# Patient Record
Sex: Female | Born: 1940 | Race: White | Hispanic: No | Smoking: Former smoker
Health system: Southern US, Community
[De-identification: ages and names within clinical notes are randomized; demographics above are authoritative.]

## PROBLEM LIST (undated history)

## (undated) DIAGNOSIS — N135 Crossing vessel and stricture of ureter without hydronephrosis: Secondary | ICD-10-CM

## (undated) DIAGNOSIS — D126 Benign neoplasm of colon, unspecified: Secondary | ICD-10-CM

## (undated) DIAGNOSIS — E785 Hyperlipidemia, unspecified: Secondary | ICD-10-CM

## (undated) DIAGNOSIS — R112 Nausea with vomiting, unspecified: Secondary | ICD-10-CM

## (undated) DIAGNOSIS — I4891 Unspecified atrial fibrillation: Secondary | ICD-10-CM

## (undated) DIAGNOSIS — I2510 Atherosclerotic heart disease of native coronary artery without angina pectoris: Secondary | ICD-10-CM

## (undated) DIAGNOSIS — Z9889 Other specified postprocedural states: Secondary | ICD-10-CM

## (undated) DIAGNOSIS — I739 Peripheral vascular disease, unspecified: Secondary | ICD-10-CM

## (undated) DIAGNOSIS — R0609 Other forms of dyspnea: Secondary | ICD-10-CM

## (undated) DIAGNOSIS — D509 Iron deficiency anemia, unspecified: Secondary | ICD-10-CM

## (undated) DIAGNOSIS — Z8619 Personal history of other infectious and parasitic diseases: Secondary | ICD-10-CM

## (undated) DIAGNOSIS — E119 Type 2 diabetes mellitus without complications: Secondary | ICD-10-CM

## (undated) DIAGNOSIS — R0600 Dyspnea, unspecified: Secondary | ICD-10-CM

## (undated) DIAGNOSIS — K922 Gastrointestinal hemorrhage, unspecified: Secondary | ICD-10-CM

## (undated) DIAGNOSIS — K219 Gastro-esophageal reflux disease without esophagitis: Secondary | ICD-10-CM

## (undated) DIAGNOSIS — I255 Ischemic cardiomyopathy: Secondary | ICD-10-CM

## (undated) DIAGNOSIS — I509 Heart failure, unspecified: Secondary | ICD-10-CM

## (undated) DIAGNOSIS — E039 Hypothyroidism, unspecified: Secondary | ICD-10-CM

## (undated) DIAGNOSIS — N1330 Unspecified hydronephrosis: Secondary | ICD-10-CM

## (undated) DIAGNOSIS — K5790 Diverticulosis of intestine, part unspecified, without perforation or abscess without bleeding: Secondary | ICD-10-CM

## (undated) DIAGNOSIS — I6529 Occlusion and stenosis of unspecified carotid artery: Secondary | ICD-10-CM

## (undated) DIAGNOSIS — I714 Abdominal aortic aneurysm, without rupture, unspecified: Secondary | ICD-10-CM

## (undated) DIAGNOSIS — K682 Retroperitoneal fibrosis: Secondary | ICD-10-CM

## (undated) DIAGNOSIS — I2583 Coronary atherosclerosis due to lipid rich plaque: Secondary | ICD-10-CM

## (undated) DIAGNOSIS — K8050 Calculus of bile duct without cholangitis or cholecystitis without obstruction: Secondary | ICD-10-CM

## (undated) DIAGNOSIS — N183 Chronic kidney disease, stage 3 unspecified: Secondary | ICD-10-CM

## (undated) DIAGNOSIS — I10 Essential (primary) hypertension: Secondary | ICD-10-CM

## (undated) HISTORY — PX: KNEE SURGERY: SHX244

## (undated) HISTORY — DX: Gastrointestinal hemorrhage, unspecified: K92.2

## (undated) HISTORY — PX: ESOPHAGOGASTRODUODENOSCOPY: SHX1529

## (undated) HISTORY — DX: Benign neoplasm of colon, unspecified: D12.6

## (undated) HISTORY — PX: CAROTID ENDARTERECTOMY: SUR193

## (undated) HISTORY — DX: Peripheral vascular disease, unspecified: I73.9

## (undated) HISTORY — DX: Diverticulosis of intestine, part unspecified, without perforation or abscess without bleeding: K57.90

## (undated) HISTORY — PX: COLONOSCOPY: SHX174

## (undated) HISTORY — PX: CARDIOVASCULAR STRESS TEST: SHX262

## (undated) HISTORY — DX: Ischemic cardiomyopathy: I25.5

## (undated) HISTORY — PX: CARDIAC CATHETERIZATION: SHX172

---

## 1984-03-08 HISTORY — PX: TOTAL ABDOMINAL HYSTERECTOMY W/ BILATERAL SALPINGOOPHORECTOMY: SHX83

## 1995-03-09 HISTORY — PX: AORTA - BILATERAL FEMORAL ARTERY BYPASS GRAFT: SHX1175

## 1995-03-09 HISTORY — PX: CORONARY ARTERY BYPASS GRAFT: SHX141

## 1999-03-05 ENCOUNTER — Encounter: Payer: Self-pay | Admitting: Emergency Medicine

## 1999-03-05 ENCOUNTER — Encounter: Admission: RE | Admit: 1999-03-05 | Discharge: 1999-03-05 | Payer: Self-pay | Admitting: Emergency Medicine

## 2000-03-07 ENCOUNTER — Encounter: Payer: Self-pay | Admitting: Endocrinology

## 2000-03-07 ENCOUNTER — Encounter: Admission: RE | Admit: 2000-03-07 | Discharge: 2000-03-07 | Payer: Self-pay | Admitting: Endocrinology

## 2001-10-05 ENCOUNTER — Encounter: Admission: RE | Admit: 2001-10-05 | Discharge: 2001-10-05 | Payer: Self-pay | Admitting: Endocrinology

## 2001-10-05 ENCOUNTER — Encounter: Payer: Self-pay | Admitting: Endocrinology

## 2004-03-08 HISTORY — PX: CATARACT EXTRACTION W/ INTRAOCULAR LENS  IMPLANT, BILATERAL: SHX1307

## 2004-11-16 ENCOUNTER — Encounter: Admission: RE | Admit: 2004-11-16 | Discharge: 2004-11-16 | Payer: Self-pay | Admitting: Endocrinology

## 2004-11-17 ENCOUNTER — Ambulatory Visit (HOSPITAL_COMMUNITY): Admission: RE | Admit: 2004-11-17 | Discharge: 2004-11-17 | Payer: Self-pay | Admitting: Endocrinology

## 2004-11-19 ENCOUNTER — Inpatient Hospital Stay (HOSPITAL_COMMUNITY): Admission: EM | Admit: 2004-11-19 | Discharge: 2004-11-23 | Payer: Self-pay | Admitting: Emergency Medicine

## 2004-11-22 ENCOUNTER — Encounter (INDEPENDENT_AMBULATORY_CARE_PROVIDER_SITE_OTHER): Payer: Self-pay | Admitting: *Deleted

## 2004-11-27 ENCOUNTER — Ambulatory Visit (HOSPITAL_COMMUNITY): Admission: RE | Admit: 2004-11-27 | Discharge: 2004-11-27 | Payer: Self-pay | Admitting: Cardiovascular Disease

## 2004-12-01 ENCOUNTER — Encounter (HOSPITAL_BASED_OUTPATIENT_CLINIC_OR_DEPARTMENT_OTHER): Admission: RE | Admit: 2004-12-01 | Discharge: 2005-03-01 | Payer: Self-pay | Admitting: Surgery

## 2004-12-02 ENCOUNTER — Ambulatory Visit (HOSPITAL_COMMUNITY): Admission: RE | Admit: 2004-12-02 | Discharge: 2004-12-02 | Payer: Self-pay | Admitting: Endocrinology

## 2004-12-23 ENCOUNTER — Ambulatory Visit (HOSPITAL_COMMUNITY): Admission: RE | Admit: 2004-12-23 | Discharge: 2004-12-23 | Payer: Self-pay | Admitting: Vascular Surgery

## 2005-01-12 ENCOUNTER — Inpatient Hospital Stay (HOSPITAL_COMMUNITY): Admission: RE | Admit: 2005-01-12 | Discharge: 2005-01-18 | Payer: Self-pay | Admitting: *Deleted

## 2005-01-12 HISTORY — PX: OTHER SURGICAL HISTORY: SHX169

## 2005-11-24 ENCOUNTER — Encounter: Admission: RE | Admit: 2005-11-24 | Discharge: 2005-11-24 | Payer: Self-pay | Admitting: Endocrinology

## 2006-01-17 ENCOUNTER — Encounter: Admission: RE | Admit: 2006-01-17 | Discharge: 2006-01-17 | Payer: Self-pay | Admitting: Endocrinology

## 2006-02-15 ENCOUNTER — Ambulatory Visit: Admission: RE | Admit: 2006-02-15 | Discharge: 2006-02-15 | Payer: Self-pay | Admitting: *Deleted

## 2006-03-23 ENCOUNTER — Ambulatory Visit: Payer: Self-pay | Admitting: Cardiology

## 2006-03-23 ENCOUNTER — Ambulatory Visit (HOSPITAL_COMMUNITY): Admission: RE | Admit: 2006-03-23 | Discharge: 2006-03-23 | Payer: Self-pay | Admitting: *Deleted

## 2006-09-08 ENCOUNTER — Ambulatory Visit: Payer: Self-pay | Admitting: *Deleted

## 2007-01-19 ENCOUNTER — Ambulatory Visit: Payer: Self-pay | Admitting: *Deleted

## 2007-05-04 ENCOUNTER — Ambulatory Visit: Payer: Self-pay | Admitting: *Deleted

## 2007-11-02 ENCOUNTER — Ambulatory Visit: Payer: Self-pay | Admitting: *Deleted

## 2007-11-28 ENCOUNTER — Encounter: Admission: RE | Admit: 2007-11-28 | Discharge: 2007-11-28 | Payer: Self-pay | Admitting: Endocrinology

## 2007-12-05 ENCOUNTER — Encounter: Admission: RE | Admit: 2007-12-05 | Discharge: 2007-12-05 | Payer: Self-pay | Admitting: Endocrinology

## 2009-01-01 ENCOUNTER — Encounter: Admission: RE | Admit: 2009-01-01 | Discharge: 2009-01-01 | Payer: Self-pay | Admitting: Endocrinology

## 2009-11-14 ENCOUNTER — Inpatient Hospital Stay (HOSPITAL_COMMUNITY): Admission: EM | Admit: 2009-11-14 | Discharge: 2009-11-22 | Payer: Self-pay | Admitting: Emergency Medicine

## 2009-11-14 ENCOUNTER — Ambulatory Visit: Payer: Self-pay | Admitting: Internal Medicine

## 2009-11-14 ENCOUNTER — Ambulatory Visit: Payer: Self-pay | Admitting: Cardiology

## 2009-11-18 ENCOUNTER — Ambulatory Visit: Payer: Self-pay | Admitting: Surgery

## 2009-11-18 ENCOUNTER — Encounter (INDEPENDENT_AMBULATORY_CARE_PROVIDER_SITE_OTHER): Payer: Self-pay | Admitting: Internal Medicine

## 2009-11-19 ENCOUNTER — Encounter (INDEPENDENT_AMBULATORY_CARE_PROVIDER_SITE_OTHER): Payer: Self-pay | Admitting: Internal Medicine

## 2009-11-25 ENCOUNTER — Inpatient Hospital Stay (HOSPITAL_COMMUNITY): Admission: EM | Admit: 2009-11-25 | Discharge: 2009-12-02 | Payer: Self-pay | Admitting: Emergency Medicine

## 2009-11-26 ENCOUNTER — Encounter (INDEPENDENT_AMBULATORY_CARE_PROVIDER_SITE_OTHER): Payer: Self-pay | Admitting: *Deleted

## 2009-12-04 ENCOUNTER — Inpatient Hospital Stay (HOSPITAL_COMMUNITY): Admission: EM | Admit: 2009-12-04 | Discharge: 2009-12-15 | Payer: Self-pay | Admitting: Emergency Medicine

## 2009-12-05 ENCOUNTER — Encounter (INDEPENDENT_AMBULATORY_CARE_PROVIDER_SITE_OTHER): Payer: Self-pay | Admitting: Internal Medicine

## 2009-12-10 ENCOUNTER — Encounter: Payer: Self-pay | Admitting: Vascular Surgery

## 2009-12-11 ENCOUNTER — Ambulatory Visit: Payer: Self-pay | Admitting: Thoracic Surgery (Cardiothoracic Vascular Surgery)

## 2009-12-25 ENCOUNTER — Ambulatory Visit: Payer: Self-pay | Admitting: Vascular Surgery

## 2010-01-12 ENCOUNTER — Inpatient Hospital Stay (HOSPITAL_COMMUNITY): Admission: EM | Admit: 2010-01-12 | Discharge: 2010-01-14 | Payer: Self-pay | Admitting: Emergency Medicine

## 2010-03-25 HISTORY — PX: TRANSTHORACIC ECHOCARDIOGRAM: SHX275

## 2010-03-28 ENCOUNTER — Encounter: Payer: Self-pay | Admitting: Cardiology

## 2010-04-09 NOTE — Letter (Signed)
Summary: Appointment - Cardiac MRI  [facility]  [address] [place]   [place], [place] [address]   Phone: [phone]  Fax: [phone]      November 26, 2009 MRN: [id number]   [place] [address], [place]  [address]   Dear Ms. Villegas,   We have scheduled the above patient for an appointment for a Cardiac MRI on Oct 4,2011 at 2:00 p.m.  Please refer to the below information for the location and instructions for this test:  Location:     [facility]       [place],   [address] Instructions:    Eartha Inch at [facility] 45 minutes prior to your appointment time.  This will ensure you are in the Radiology Department 30 minutes prior to your appointment.    There are no restrictions for this test you may eat and take medications as usual.  If you need to reschedule this appointment please call at the number listed above.  Sincerely,        [facility] Scheduling Team

## 2010-05-19 LAB — CBC
HCT: 32.3 % — ABNORMAL LOW (ref 36.0–46.0)
Hemoglobin: 11.1 g/dL — ABNORMAL LOW (ref 12.0–15.0)
Hemoglobin: 11.1 g/dL — ABNORMAL LOW (ref 12.0–15.0)
MCH: 32.2 pg (ref 26.0–34.0)
MCHC: 34 g/dL (ref 30.0–36.0)
MCHC: 34.2 g/dL (ref 30.0–36.0)
MCV: 93.9 fL (ref 78.0–100.0)
MCV: 94.6 fL (ref 78.0–100.0)
Platelets: 196 10*3/uL (ref 150–400)
RDW: 15.5 % (ref 11.5–15.5)
WBC: 5.8 10*3/uL (ref 4.0–10.5)
WBC: 6.4 10*3/uL (ref 4.0–10.5)

## 2010-05-19 LAB — GLUCOSE, CAPILLARY
Glucose-Capillary: 111 mg/dL — ABNORMAL HIGH (ref 70–99)
Glucose-Capillary: 116 mg/dL — ABNORMAL HIGH (ref 70–99)
Glucose-Capillary: 122 mg/dL — ABNORMAL HIGH (ref 70–99)
Glucose-Capillary: 135 mg/dL — ABNORMAL HIGH (ref 70–99)
Glucose-Capillary: 174 mg/dL — ABNORMAL HIGH (ref 70–99)
Glucose-Capillary: 26 mg/dL — CL (ref 70–99)
Glucose-Capillary: 55 mg/dL — ABNORMAL LOW (ref 70–99)
Glucose-Capillary: 76 mg/dL (ref 70–99)
Glucose-Capillary: 99 mg/dL (ref 70–99)

## 2010-05-19 LAB — DIFFERENTIAL
Basophils Absolute: 0 10*3/uL (ref 0.0–0.1)
Basophils Relative: 0 % (ref 0–1)
Eosinophils Relative: 0 % (ref 0–5)
Lymphocytes Relative: 10 % — ABNORMAL LOW (ref 12–46)
Lymphocytes Relative: 8 % — ABNORMAL LOW (ref 12–46)
Monocytes Absolute: 0.6 10*3/uL (ref 0.1–1.0)
Monocytes Relative: 10 % (ref 3–12)
Neutro Abs: 4.7 10*3/uL (ref 1.7–7.7)
Neutro Abs: 5.2 10*3/uL (ref 1.7–7.7)

## 2010-05-19 LAB — CARDIAC PANEL(CRET KIN+CKTOT+MB+TROPI)
CK, MB: 0.8 ng/mL (ref 0.3–4.0)
CK, MB: 1.7 ng/mL (ref 0.3–4.0)
Relative Index: INVALID (ref 0.0–2.5)
Relative Index: INVALID (ref 0.0–2.5)
Total CK: 36 U/L (ref 7–177)
Total CK: 42 U/L (ref 7–177)
Troponin I: 0.14 ng/mL — ABNORMAL HIGH (ref 0.00–0.06)
Troponin I: 0.36 ng/mL — ABNORMAL HIGH (ref 0.00–0.06)

## 2010-05-19 LAB — POCT CARDIAC MARKERS: Myoglobin, poc: 72.7 ng/mL (ref 12–200)

## 2010-05-19 LAB — COMPREHENSIVE METABOLIC PANEL
AST: 35 U/L (ref 0–37)
Albumin: 2.1 g/dL — ABNORMAL LOW (ref 3.5–5.2)
Alkaline Phosphatase: 38 U/L — ABNORMAL LOW (ref 39–117)
Chloride: 107 mEq/L (ref 96–112)
GFR calc Af Amer: >60 mL/min (ref >60)
Potassium: 3.4 mEq/L — ABNORMAL LOW (ref 3.5–5.1)
Total Bilirubin: 0.2 mg/dL — ABNORMAL LOW (ref 0.3–1.2)
Total Protein: 5 g/dL — ABNORMAL LOW (ref 6.0–8.3)

## 2010-05-19 LAB — URINE MICROSCOPIC-ADD ON

## 2010-05-19 LAB — BASIC METABOLIC PANEL
BUN: 14 mg/dL (ref 6–23)
CO2: 26 mEq/L (ref 19–32)
Calcium: 7.5 mg/dL — ABNORMAL LOW (ref 8.4–10.5)
Creatinine, Ser: 0.89 mg/dL (ref 0.4–1.2)
Creatinine, Ser: 1.06 mg/dL (ref 0.4–1.2)
GFR calc non Af Amer: >60 mL/min (ref >60)
Glucose, Bld: 133 mg/dL — ABNORMAL HIGH (ref 70–99)

## 2010-05-19 LAB — URINALYSIS, ROUTINE W REFLEX MICROSCOPIC
Glucose, UA: NEGATIVE mg/dL
Hgb urine dipstick: NEGATIVE
Protein, ur: 30 mg/dL — AB
pH: 6.5 (ref 5.0–8.0)

## 2010-05-19 LAB — TSH: TSH: 0.105 u[IU]/mL — ABNORMAL LOW (ref 0.350–4.500)

## 2010-05-19 LAB — HEMOGLOBIN A1C: Hgb A1c MFr Bld: 5.3 % (ref <5.7)

## 2010-05-19 LAB — LIPID PANEL
LDL Cholesterol: 39 mg/dL (ref 0–99)
VLDL: 31 mg/dL (ref 0–40)

## 2010-05-19 LAB — URINE CULTURE: Colony Count: >=100000

## 2010-05-20 LAB — TYPE AND SCREEN

## 2010-05-20 LAB — GLUCOSE, CAPILLARY
Glucose-Capillary: 107 mg/dL — ABNORMAL HIGH (ref 70–99)
Glucose-Capillary: 108 mg/dL — ABNORMAL HIGH (ref 70–99)
Glucose-Capillary: 111 mg/dL — ABNORMAL HIGH (ref 70–99)
Glucose-Capillary: 115 mg/dL — ABNORMAL HIGH (ref 70–99)
Glucose-Capillary: 115 mg/dL — ABNORMAL HIGH (ref 70–99)
Glucose-Capillary: 115 mg/dL — ABNORMAL HIGH (ref 70–99)
Glucose-Capillary: 118 mg/dL — ABNORMAL HIGH (ref 70–99)
Glucose-Capillary: 124 mg/dL — ABNORMAL HIGH (ref 70–99)
Glucose-Capillary: 128 mg/dL — ABNORMAL HIGH (ref 70–99)
Glucose-Capillary: 138 mg/dL — ABNORMAL HIGH (ref 70–99)
Glucose-Capillary: 142 mg/dL — ABNORMAL HIGH (ref 70–99)
Glucose-Capillary: 142 mg/dL — ABNORMAL HIGH (ref 70–99)
Glucose-Capillary: 144 mg/dL — ABNORMAL HIGH (ref 70–99)
Glucose-Capillary: 146 mg/dL — ABNORMAL HIGH (ref 70–99)
Glucose-Capillary: 147 mg/dL — ABNORMAL HIGH (ref 70–99)
Glucose-Capillary: 167 mg/dL — ABNORMAL HIGH (ref 70–99)
Glucose-Capillary: 175 mg/dL — ABNORMAL HIGH (ref 70–99)
Glucose-Capillary: 88 mg/dL (ref 70–99)
Glucose-Capillary: 92 mg/dL (ref 70–99)
Glucose-Capillary: 93 mg/dL (ref 70–99)
Glucose-Capillary: 98 mg/dL (ref 70–99)

## 2010-05-20 LAB — COMPREHENSIVE METABOLIC PANEL
ALT: 10 U/L (ref 0–35)
ALT: 10 U/L (ref 0–35)
ALT: 8 U/L (ref 0–35)
ALT: 9 U/L (ref 0–35)
ALT: 9 U/L (ref 0–35)
ALT: 9 U/L (ref 0–35)
AST: 15 U/L (ref 0–37)
AST: 15 U/L (ref 0–37)
AST: 18 U/L (ref 0–37)
AST: 26 U/L (ref 0–37)
Albumin: 1.9 g/dL — ABNORMAL LOW (ref 3.5–5.2)
Alkaline Phosphatase: 29 U/L — ABNORMAL LOW (ref 39–117)
Alkaline Phosphatase: 41 U/L (ref 39–117)
Alkaline Phosphatase: 46 U/L (ref 39–117)
CO2: 19 mEq/L (ref 19–32)
CO2: 24 mEq/L (ref 19–32)
CO2: 27 mEq/L (ref 19–32)
Calcium: 7.7 mg/dL — ABNORMAL LOW (ref 8.4–10.5)
Calcium: 8.1 mg/dL — ABNORMAL LOW (ref 8.4–10.5)
Calcium: 8.2 mg/dL — ABNORMAL LOW (ref 8.4–10.5)
Calcium: 8.6 mg/dL (ref 8.4–10.5)
Calcium: 9.2 mg/dL (ref 8.4–10.5)
Chloride: 108 mEq/L (ref 96–112)
Chloride: 110 mEq/L (ref 96–112)
Chloride: 114 mEq/L — ABNORMAL HIGH (ref 96–112)
Creatinine, Ser: 0.73 mg/dL (ref 0.4–1.2)
Creatinine, Ser: 0.87 mg/dL (ref 0.4–1.2)
GFR calc Af Amer: >60 mL/min (ref >60)
GFR calc Af Amer: >60 mL/min (ref >60)
GFR calc Af Amer: >60 mL/min (ref >60)
GFR calc non Af Amer: >60 mL/min (ref >60)
GFR calc non Af Amer: >60 mL/min (ref >60)
GFR calc non Af Amer: >60 mL/min (ref >60)
Glucose, Bld: 117 mg/dL — ABNORMAL HIGH (ref 70–99)
Glucose, Bld: 137 mg/dL — ABNORMAL HIGH (ref 70–99)
Glucose, Bld: 94 mg/dL (ref 70–99)
Potassium: 3.1 mEq/L — ABNORMAL LOW (ref 3.5–5.1)
Potassium: 4.2 mEq/L (ref 3.5–5.1)
Potassium: 4.3 mEq/L (ref 3.5–5.1)
Sodium: 139 mEq/L (ref 135–145)
Sodium: 139 mEq/L (ref 135–145)
Sodium: 139 mEq/L (ref 135–145)
Sodium: 140 mEq/L (ref 135–145)
Sodium: 140 mEq/L (ref 135–145)
Total Bilirubin: 0.4 mg/dL (ref 0.3–1.2)
Total Bilirubin: 0.5 mg/dL (ref 0.3–1.2)
Total Protein: 4.3 g/dL — ABNORMAL LOW (ref 6.0–8.3)
Total Protein: 4.8 g/dL — ABNORMAL LOW (ref 6.0–8.3)
Total Protein: 4.8 g/dL — ABNORMAL LOW (ref 6.0–8.3)

## 2010-05-20 LAB — CBC
HCT: 28.7 % — ABNORMAL LOW (ref 36.0–46.0)
HCT: 31.7 % — ABNORMAL LOW (ref 36.0–46.0)
HCT: 33.4 % — ABNORMAL LOW (ref 36.0–46.0)
HCT: 33.5 % — ABNORMAL LOW (ref 36.0–46.0)
HCT: 34.1 % — ABNORMAL LOW (ref 36.0–46.0)
HCT: 35.1 % — ABNORMAL LOW (ref 36.0–46.0)
Hemoglobin: 10 g/dL — ABNORMAL LOW (ref 12.0–15.0)
Hemoglobin: 10.1 g/dL — ABNORMAL LOW (ref 12.0–15.0)
Hemoglobin: 10.5 g/dL — ABNORMAL LOW (ref 12.0–15.0)
Hemoglobin: 11.1 g/dL — ABNORMAL LOW (ref 12.0–15.0)
Hemoglobin: 11.8 g/dL — ABNORMAL LOW (ref 12.0–15.0)
Hemoglobin: 9.2 g/dL — ABNORMAL LOW (ref 12.0–15.0)
MCH: 31 pg (ref 26.0–34.0)
MCH: 31.3 pg (ref 26.0–34.0)
MCH: 31.6 pg (ref 26.0–34.0)
MCHC: 31.4 g/dL (ref 30.0–36.0)
MCHC: 31.9 g/dL (ref 30.0–36.0)
MCHC: 32.2 g/dL (ref 30.0–36.0)
MCHC: 32.2 g/dL (ref 30.0–36.0)
MCHC: 32.5 g/dL (ref 30.0–36.0)
MCHC: 32.6 g/dL (ref 30.0–36.0)
MCV: 96.6 fL (ref 78.0–100.0)
MCV: 97.6 fL (ref 78.0–100.0)
Platelets: 187 10*3/uL (ref 150–400)
Platelets: 211 10*3/uL (ref 150–400)
Platelets: 251 10*3/uL (ref 150–400)
Platelets: 317 10*3/uL (ref 150–400)
RBC: 2.64 MIL/uL — ABNORMAL LOW (ref 3.87–5.11)
RBC: 2.94 MIL/uL — ABNORMAL LOW (ref 3.87–5.11)
RBC: 3.18 MIL/uL — ABNORMAL LOW (ref 3.87–5.11)
RBC: 3.32 MIL/uL — ABNORMAL LOW (ref 3.87–5.11)
RBC: 3.76 MIL/uL — ABNORMAL LOW (ref 3.87–5.11)
RDW: 18.2 % — ABNORMAL HIGH (ref 11.5–15.5)
RDW: 18.3 % — ABNORMAL HIGH (ref 11.5–15.5)
RDW: 18.9 % — ABNORMAL HIGH (ref 11.5–15.5)
RDW: 19.1 % — ABNORMAL HIGH (ref 11.5–15.5)
RDW: 19.3 % — ABNORMAL HIGH (ref 11.5–15.5)
WBC: 5.3 10*3/uL (ref 4.0–10.5)
WBC: 6.6 10*3/uL (ref 4.0–10.5)
WBC: 7.1 10*3/uL (ref 4.0–10.5)
WBC: 8.6 10*3/uL (ref 4.0–10.5)

## 2010-05-20 LAB — BASIC METABOLIC PANEL
BUN: 7 mg/dL (ref 6–23)
CO2: 21 mEq/L (ref 19–32)
Calcium: 8 mg/dL — ABNORMAL LOW (ref 8.4–10.5)
Chloride: 109 mEq/L (ref 96–112)
Creatinine, Ser: 0.78 mg/dL (ref 0.4–1.2)
GFR calc Af Amer: >60 mL/min (ref >60)
GFR calc Af Amer: >60 mL/min (ref >60)
GFR calc non Af Amer: >60 mL/min (ref >60)
GFR calc non Af Amer: >60 mL/min (ref >60)
Glucose, Bld: 138 mg/dL — ABNORMAL HIGH (ref 70–99)
Glucose, Bld: 143 mg/dL — ABNORMAL HIGH (ref 70–99)
Potassium: 3.6 mEq/L (ref 3.5–5.1)
Potassium: 4.9 mEq/L (ref 3.5–5.1)
Sodium: 138 mEq/L (ref 135–145)
Sodium: 138 mEq/L (ref 135–145)
Sodium: 140 mEq/L (ref 135–145)

## 2010-05-20 LAB — CLOSTRIDIUM DIFFICILE EIA: C difficile Toxins A+B, EIA: NEGATIVE

## 2010-05-20 LAB — HEMOGLOBIN AND HEMATOCRIT, BLOOD
HCT: 32.8 % — ABNORMAL LOW (ref 36.0–46.0)
Hemoglobin: 10.5 g/dL — ABNORMAL LOW (ref 12.0–15.0)

## 2010-05-20 LAB — PREPARE RBC (CROSSMATCH)

## 2010-05-20 LAB — CLOSTRIDIUM DIFFICILE BY PCR

## 2010-05-21 LAB — CBC
HCT: 24 % — ABNORMAL LOW (ref 36.0–46.0)
HCT: 25.9 % — ABNORMAL LOW (ref 36.0–46.0)
HCT: 30.1 % — ABNORMAL LOW (ref 36.0–46.0)
HCT: 31 % — ABNORMAL LOW (ref 36.0–46.0)
HCT: 31.1 % — ABNORMAL LOW (ref 36.0–46.0)
HCT: 33.1 % — ABNORMAL LOW (ref 36.0–46.0)
HCT: 25.3 % — ABNORMAL LOW (ref 36.0–46.0)
HCT: 26.5 % — ABNORMAL LOW (ref 36.0–46.0)
HCT: 27 % — ABNORMAL LOW (ref 36.0–46.0)
HCT: 31.6 % — ABNORMAL LOW (ref 36.0–46.0)
Hemoglobin: 10 g/dL — ABNORMAL LOW (ref 12.0–15.0)
Hemoglobin: 14.1 g/dL (ref 12.0–15.0)
Hemoglobin: 8.3 g/dL — ABNORMAL LOW (ref 12.0–15.0)
Hemoglobin: 9.7 g/dL — ABNORMAL LOW (ref 12.0–15.0)
Hemoglobin: 10.2 g/dL — ABNORMAL LOW (ref 12.0–15.0)
Hemoglobin: 8 g/dL — ABNORMAL LOW (ref 12.0–15.0)
Hemoglobin: 8.6 g/dL — ABNORMAL LOW (ref 12.0–15.0)
MCH: 30.1 pg (ref 26.0–34.0)
MCH: 30.8 pg (ref 26.0–34.0)
MCH: 30.9 pg (ref 26.0–34.0)
MCH: 31.4 pg (ref 26.0–34.0)
MCH: 32.3 pg (ref 26.0–34.0)
MCH: 28.4 pg (ref 26.0–34.0)
MCH: 28.6 pg (ref 26.0–34.0)
MCH: 28.7 pg (ref 26.0–34.0)
MCH: 29 pg (ref 26.0–34.0)
MCH: 29.1 pg (ref 26.0–34.0)
MCHC: 31.3 g/dL (ref 30.0–36.0)
MCHC: 32.1 g/dL (ref 30.0–36.0)
MCHC: 32.2 g/dL (ref 30.0–36.0)
MCHC: 32.6 g/dL (ref 30.0–36.0)
MCHC: 31.3 g/dL (ref 30.0–36.0)
MCHC: 31.6 g/dL (ref 30.0–36.0)
MCHC: 32.3 g/dL (ref 30.0–36.0)
MCHC: 32.5 g/dL (ref 30.0–36.0)
MCHC: 32.6 g/dL (ref 30.0–36.0)
MCV: 93.7 fL (ref 78.0–100.0)
MCV: 93.8 fL (ref 78.0–100.0)
MCV: 94.7 fL (ref 78.0–100.0)
MCV: 95.2 fL (ref 78.0–100.0)
MCV: 96.2 fL (ref 78.0–100.0)
MCV: 97.9 fL (ref 78.0–100.0)
MCV: 98.8 fL (ref 78.0–100.0)
MCV: 89 fL (ref 78.0–100.0)
MCV: 89.2 fL (ref 78.0–100.0)
MCV: 89.3 fL (ref 78.0–100.0)
MCV: 90.4 fL (ref 78.0–100.0)
MCV: 90.6 fL (ref 78.0–100.0)
MCV: 92.2 fL (ref 78.0–100.0)
Platelets: 279 10*3/uL (ref 150–400)
Platelets: 286 10*3/uL (ref 150–400)
Platelets: 300 10*3/uL (ref 150–400)
Platelets: 335 10*3/uL (ref 150–400)
Platelets: 309 10*3/uL (ref 150–400)
Platelets: 325 10*3/uL (ref 150–400)
Platelets: 335 10*3/uL (ref 150–400)
Platelets: 344 10*3/uL (ref 150–400)
Platelets: 397 10*3/uL (ref 150–400)
Platelets: 458 10*3/uL — ABNORMAL HIGH (ref 150–400)
RBC: 3.18 MIL/uL — ABNORMAL LOW (ref 3.87–5.11)
RBC: 3.53 MIL/uL — ABNORMAL LOW (ref 3.87–5.11)
RBC: 4.37 MIL/uL (ref 3.87–5.11)
RBC: 2.8 MIL/uL — ABNORMAL LOW (ref 3.87–5.11)
RBC: 2.93 MIL/uL — ABNORMAL LOW (ref 3.87–5.11)
RBC: 2.97 MIL/uL — ABNORMAL LOW (ref 3.87–5.11)
RBC: 3.1 MIL/uL — ABNORMAL LOW (ref 3.87–5.11)
RBC: 3.55 MIL/uL — ABNORMAL LOW (ref 3.87–5.11)
RDW: 20.2 % — ABNORMAL HIGH (ref 11.5–15.5)
RDW: 20.4 % — ABNORMAL HIGH (ref 11.5–15.5)
RDW: 22.4 % — ABNORMAL HIGH (ref 11.5–15.5)
RDW: 22.6 % — ABNORMAL HIGH (ref 11.5–15.5)
RDW: 22.7 % — ABNORMAL HIGH (ref 11.5–15.5)
RDW: 21.2 % — ABNORMAL HIGH (ref 11.5–15.5)
RDW: 21.7 % — ABNORMAL HIGH (ref 11.5–15.5)
RDW: 21.8 % — ABNORMAL HIGH (ref 11.5–15.5)
RDW: 22 % — ABNORMAL HIGH (ref 11.5–15.5)
RDW: 22.3 % — ABNORMAL HIGH (ref 11.5–15.5)
RDW: 22.5 % — ABNORMAL HIGH (ref 11.5–15.5)
WBC: 10.1 10*3/uL (ref 4.0–10.5)
WBC: 10.4 10*3/uL (ref 4.0–10.5)
WBC: 18.3 10*3/uL — ABNORMAL HIGH (ref 4.0–10.5)
WBC: 24.3 10*3/uL — ABNORMAL HIGH (ref 4.0–10.5)
WBC: 9.7 10*3/uL (ref 4.0–10.5)
WBC: 17.3 10*3/uL — ABNORMAL HIGH (ref 4.0–10.5)
WBC: 6.1 10*3/uL (ref 4.0–10.5)
WBC: 6.9 10*3/uL (ref 4.0–10.5)
WBC: 8.1 10*3/uL (ref 4.0–10.5)

## 2010-05-21 LAB — CULTURE, BLOOD (ROUTINE X 2)
Culture  Setup Time: 201109210210
Culture  Setup Time: 201109300151
Culture: NO GROWTH
Culture: NO GROWTH
Culture: NO GROWTH
Culture: NO GROWTH

## 2010-05-21 LAB — CK TOTAL AND CKMB (NOT AT ARMC)
CK, MB: 3.3 ng/mL (ref 0.3–4.0)
CK, MB: 3.4 ng/mL (ref 0.3–4.0)
CK, MB: 5.3 ng/mL — ABNORMAL HIGH (ref 0.3–4.0)
CK, MB: 4.3 ng/mL — ABNORMAL HIGH (ref 0.3–4.0)
Relative Index: INVALID (ref 0.0–2.5)
Relative Index: INVALID (ref 0.0–2.5)
Relative Index: INVALID (ref 0.0–2.5)
Total CK: 21 U/L (ref 7–177)
Total CK: 31 U/L (ref 7–177)
Total CK: 50 U/L (ref 7–177)
Total CK: 65 U/L (ref 7–177)

## 2010-05-21 LAB — DIFFERENTIAL
Basophils Absolute: 0.1 10*3/uL (ref 0.0–0.1)
Basophils Absolute: 0 K/uL (ref 0.0–0.1)
Basophils Relative: 0 % (ref 0–1)
Basophils Relative: 1 % (ref 0–1)
Basophils Relative: 0 % (ref 0–1)
Eosinophils Absolute: 0.3 10*3/uL (ref 0.0–0.7)
Eosinophils Absolute: 0.3 10*3/uL (ref 0.0–0.7)
Eosinophils Absolute: 0 K/uL (ref 0.0–0.7)
Eosinophils Relative: 0 % (ref 0–5)
Eosinophils Relative: 3 % (ref 0–5)
Eosinophils Relative: 4 % (ref 0–5)
Eosinophils Relative: 0 % (ref 0–5)
Lymphocytes Relative: 6 % — ABNORMAL LOW (ref 12–46)
Lymphocytes Relative: 7 % — ABNORMAL LOW (ref 12–46)
Lymphocytes Relative: 4 % — ABNORMAL LOW (ref 12–46)
Lymphs Abs: 1.4 10*3/uL (ref 0.7–4.0)
Lymphs Abs: 1.5 10*3/uL (ref 0.7–4.0)
Lymphs Abs: 1.5 10*3/uL (ref 0.7–4.0)
Lymphs Abs: 0.7 10*3/uL (ref 0.7–4.0)
Monocytes Absolute: 0.6 10*3/uL (ref 0.1–1.0)
Monocytes Absolute: 0.7 K/uL (ref 0.1–1.0)
Monocytes Relative: 6 % (ref 3–12)
Monocytes Relative: 8 % (ref 3–12)
Monocytes Relative: 4 % (ref 3–12)
Neutro Abs: 18.5 10*3/uL — ABNORMAL HIGH (ref 1.7–7.7)
Neutro Abs: 21.3 10*3/uL — ABNORMAL HIGH (ref 1.7–7.7)
Neutro Abs: 15.9 10*3/uL — ABNORMAL HIGH (ref 1.7–7.7)
Neutrophils Relative %: 62 % (ref 43–77)
Neutrophils Relative %: 71 % (ref 43–77)
Neutrophils Relative %: 86 % — ABNORMAL HIGH (ref 43–77)
Neutrophils Relative %: 92 % — ABNORMAL HIGH (ref 43–77)

## 2010-05-21 LAB — BASIC METABOLIC PANEL
BUN: 6 mg/dL (ref 6–23)
BUN: 7 mg/dL (ref 6–23)
BUN: 8 mg/dL (ref 6–23)
BUN: 3 mg/dL — ABNORMAL LOW (ref 6–23)
BUN: 3 mg/dL — ABNORMAL LOW (ref 6–23)
BUN: 5 mg/dL — ABNORMAL LOW (ref 6–23)
BUN: 7 mg/dL (ref 6–23)
CO2: 26 mEq/L (ref 19–32)
CO2: 27 mEq/L (ref 19–32)
CO2: 29 mEq/L (ref 19–32)
CO2: 21 mEq/L (ref 19–32)
CO2: 21 mEq/L (ref 19–32)
CO2: 24 mEq/L (ref 19–32)
Calcium: 6.2 mg/dL — CL (ref 8.4–10.5)
Calcium: 6.6 mg/dL — ABNORMAL LOW (ref 8.4–10.5)
Calcium: 7.6 mg/dL — ABNORMAL LOW (ref 8.4–10.5)
Calcium: 8.5 mg/dL (ref 8.4–10.5)
Calcium: 7.1 mg/dL — ABNORMAL LOW (ref 8.4–10.5)
Calcium: 7.6 mg/dL — ABNORMAL LOW (ref 8.4–10.5)
Calcium: 7.7 mg/dL — ABNORMAL LOW (ref 8.4–10.5)
Calcium: 8 mg/dL — ABNORMAL LOW (ref 8.4–10.5)
Chloride: 105 mEq/L (ref 96–112)
Chloride: 110 mEq/L (ref 96–112)
Chloride: 111 mEq/L (ref 96–112)
Chloride: 114 mEq/L — ABNORMAL HIGH (ref 96–112)
Chloride: 115 mEq/L — ABNORMAL HIGH (ref 96–112)
Creatinine, Ser: 0.59 mg/dL (ref 0.4–1.2)
Creatinine, Ser: 0.63 mg/dL (ref 0.4–1.2)
Creatinine, Ser: 0.77 mg/dL (ref 0.4–1.2)
Creatinine, Ser: 0.77 mg/dL (ref 0.4–1.2)
Creatinine, Ser: 0.79 mg/dL (ref 0.4–1.2)
Creatinine, Ser: 0.87 mg/dL (ref 0.4–1.2)
Creatinine, Ser: 1.03 mg/dL (ref 0.4–1.2)
GFR calc Af Amer: 58 mL/min — ABNORMAL LOW (ref >60)
GFR calc Af Amer: >60 mL/min (ref >60)
GFR calc Af Amer: >60 mL/min (ref >60)
GFR calc Af Amer: >60 mL/min (ref >60)
GFR calc Af Amer: 44 mL/min — ABNORMAL LOW (ref >60)
GFR calc Af Amer: >60 mL/min (ref >60)
GFR calc Af Amer: >60 mL/min (ref >60)
GFR calc Af Amer: >60 mL/min (ref >60)
GFR calc non Af Amer: 48 mL/min — ABNORMAL LOW (ref >60)
GFR calc non Af Amer: 56 mL/min — ABNORMAL LOW (ref >60)
GFR calc non Af Amer: 36 mL/min — ABNORMAL LOW (ref >60)
GFR calc non Af Amer: 53 mL/min — ABNORMAL LOW (ref >60)
GFR calc non Af Amer: >60 mL/min (ref >60)
GFR calc non Af Amer: >60 mL/min (ref >60)
Glucose, Bld: 165 mg/dL — ABNORMAL HIGH (ref 70–99)
Glucose, Bld: 108 mg/dL — ABNORMAL HIGH (ref 70–99)
Glucose, Bld: 128 mg/dL — ABNORMAL HIGH (ref 70–99)
Glucose, Bld: 96 mg/dL (ref 70–99)
Potassium: 3.7 mEq/L (ref 3.5–5.1)
Potassium: 3.3 mEq/L — ABNORMAL LOW (ref 3.5–5.1)
Potassium: 3.4 mEq/L — ABNORMAL LOW (ref 3.5–5.1)
Potassium: 3.9 mEq/L (ref 3.5–5.1)
Sodium: 141 mEq/L (ref 135–145)
Sodium: 141 mEq/L (ref 135–145)
Sodium: 138 mEq/L (ref 135–145)
Sodium: 140 mEq/L (ref 135–145)
Sodium: 143 mEq/L (ref 135–145)

## 2010-05-21 LAB — POCT I-STAT, CHEM 8
BUN: 19 mg/dL (ref 6–23)
HCT: 37 % (ref 36.0–46.0)
Sodium: 138 mEq/L (ref 135–145)
TCO2: 30 mmol/L (ref 0–100)

## 2010-05-21 LAB — COMPREHENSIVE METABOLIC PANEL
ALT: 17 U/L (ref 0–35)
Albumin: 2.2 g/dL — ABNORMAL LOW (ref 3.5–5.2)
Albumin: 2.3 g/dL — ABNORMAL LOW (ref 3.5–5.2)
Albumin: 2 g/dL — ABNORMAL LOW (ref 3.5–5.2)
Albumin: 2 g/dL — ABNORMAL LOW (ref 3.5–5.2)
Alkaline Phosphatase: 42 U/L (ref 39–117)
Alkaline Phosphatase: 53 U/L (ref 39–117)
BUN: 15 mg/dL (ref 6–23)
BUN: 18 mg/dL (ref 6–23)
BUN: 19 mg/dL (ref 6–23)
BUN: 23 mg/dL (ref 6–23)
BUN: 3 mg/dL — ABNORMAL LOW (ref 6–23)
BUN: 9 mg/dL (ref 6–23)
CO2: 26 mEq/L (ref 19–32)
CO2: 28 mEq/L (ref 19–32)
Calcium: 7.9 mg/dL — ABNORMAL LOW (ref 8.4–10.5)
Calcium: 9 mg/dL (ref 8.4–10.5)
Calcium: 7.4 mg/dL — ABNORMAL LOW (ref 8.4–10.5)
Chloride: 104 mEq/L (ref 96–112)
Chloride: 94 mEq/L — ABNORMAL LOW (ref 96–112)
Chloride: 111 mEq/L (ref 96–112)
Creatinine, Ser: 1.08 mg/dL (ref 0.4–1.2)
Creatinine, Ser: 1.58 mg/dL — ABNORMAL HIGH (ref 0.4–1.2)
Creatinine, Ser: 1.67 mg/dL — ABNORMAL HIGH (ref 0.4–1.2)
Creatinine, Ser: 1.05 mg/dL (ref 0.4–1.2)
GFR calc non Af Amer: 31 mL/min — ABNORMAL LOW (ref >60)
GFR calc non Af Amer: 33 mL/min — ABNORMAL LOW (ref >60)
Glucose, Bld: 124 mg/dL — ABNORMAL HIGH (ref 70–99)
Glucose, Bld: 136 mg/dL — ABNORMAL HIGH (ref 70–99)
Potassium: 3.7 mEq/L (ref 3.5–5.1)
Potassium: 4.8 mEq/L (ref 3.5–5.1)
Potassium: 4.2 mEq/L (ref 3.5–5.1)
Sodium: 138 mEq/L (ref 135–145)
Total Bilirubin: 0.8 mg/dL (ref 0.3–1.2)
Total Bilirubin: 1.1 mg/dL (ref 0.3–1.2)
Total Bilirubin: 0.4 mg/dL (ref 0.3–1.2)
Total Bilirubin: 0.5 mg/dL (ref 0.3–1.2)
Total Protein: 5.3 g/dL — ABNORMAL LOW (ref 6.0–8.3)
Total Protein: 5.5 g/dL — ABNORMAL LOW (ref 6.0–8.3)
Total Protein: 4.8 g/dL — ABNORMAL LOW (ref 6.0–8.3)
Total Protein: 5 g/dL — ABNORMAL LOW (ref 6.0–8.3)

## 2010-05-21 LAB — MAGNESIUM
Magnesium: 1 mg/dL — ABNORMAL LOW (ref 1.5–2.5)
Magnesium: 1.7 mg/dL (ref 1.5–2.5)
Magnesium: 1.2 mg/dL — ABNORMAL LOW (ref 1.5–2.5)
Magnesium: 1.4 mg/dL — ABNORMAL LOW (ref 1.5–2.5)
Magnesium: 1.4 mg/dL — ABNORMAL LOW (ref 1.5–2.5)

## 2010-05-21 LAB — CARDIAC PANEL(CRET KIN+CKTOT+MB+TROPI)
CK, MB: 2.6 ng/mL (ref 0.3–4.0)
Relative Index: INVALID (ref 0.0–2.5)
Relative Index: INVALID (ref 0.0–2.5)
Relative Index: INVALID (ref 0.0–2.5)
Relative Index: INVALID (ref 0.0–2.5)
Troponin I: 0.03 ng/mL (ref 0.00–0.06)
Troponin I: 0.03 ng/mL (ref 0.00–0.06)
Troponin I: 0.06 ng/mL (ref 0.00–0.06)
Troponin I: 0.12 ng/mL — ABNORMAL HIGH (ref 0.00–0.06)

## 2010-05-21 LAB — GLUCOSE, CAPILLARY
Glucose-Capillary: 105 mg/dL — ABNORMAL HIGH (ref 70–99)
Glucose-Capillary: 105 mg/dL — ABNORMAL HIGH (ref 70–99)
Glucose-Capillary: 123 mg/dL — ABNORMAL HIGH (ref 70–99)
Glucose-Capillary: 127 mg/dL — ABNORMAL HIGH (ref 70–99)
Glucose-Capillary: 128 mg/dL — ABNORMAL HIGH (ref 70–99)
Glucose-Capillary: 128 mg/dL — ABNORMAL HIGH (ref 70–99)
Glucose-Capillary: 130 mg/dL — ABNORMAL HIGH (ref 70–99)
Glucose-Capillary: 132 mg/dL — ABNORMAL HIGH (ref 70–99)
Glucose-Capillary: 135 mg/dL — ABNORMAL HIGH (ref 70–99)
Glucose-Capillary: 143 mg/dL — ABNORMAL HIGH (ref 70–99)
Glucose-Capillary: 144 mg/dL — ABNORMAL HIGH (ref 70–99)
Glucose-Capillary: 147 mg/dL — ABNORMAL HIGH (ref 70–99)
Glucose-Capillary: 149 mg/dL — ABNORMAL HIGH (ref 70–99)
Glucose-Capillary: 149 mg/dL — ABNORMAL HIGH (ref 70–99)
Glucose-Capillary: 150 mg/dL — ABNORMAL HIGH (ref 70–99)
Glucose-Capillary: 154 mg/dL — ABNORMAL HIGH (ref 70–99)
Glucose-Capillary: 165 mg/dL — ABNORMAL HIGH (ref 70–99)
Glucose-Capillary: 176 mg/dL — ABNORMAL HIGH (ref 70–99)
Glucose-Capillary: 91 mg/dL (ref 70–99)
Glucose-Capillary: 96 mg/dL (ref 70–99)
Glucose-Capillary: 96 mg/dL (ref 70–99)
Glucose-Capillary: 101 mg/dL — ABNORMAL HIGH (ref 70–99)
Glucose-Capillary: 120 mg/dL — ABNORMAL HIGH (ref 70–99)
Glucose-Capillary: 128 mg/dL — ABNORMAL HIGH (ref 70–99)
Glucose-Capillary: 128 mg/dL — ABNORMAL HIGH (ref 70–99)
Glucose-Capillary: 130 mg/dL — ABNORMAL HIGH (ref 70–99)
Glucose-Capillary: 134 mg/dL — ABNORMAL HIGH (ref 70–99)
Glucose-Capillary: 136 mg/dL — ABNORMAL HIGH (ref 70–99)
Glucose-Capillary: 138 mg/dL — ABNORMAL HIGH (ref 70–99)
Glucose-Capillary: 138 mg/dL — ABNORMAL HIGH (ref 70–99)
Glucose-Capillary: 139 mg/dL — ABNORMAL HIGH (ref 70–99)
Glucose-Capillary: 139 mg/dL — ABNORMAL HIGH (ref 70–99)
Glucose-Capillary: 141 mg/dL — ABNORMAL HIGH (ref 70–99)
Glucose-Capillary: 142 mg/dL — ABNORMAL HIGH (ref 70–99)
Glucose-Capillary: 145 mg/dL — ABNORMAL HIGH (ref 70–99)
Glucose-Capillary: 153 mg/dL — ABNORMAL HIGH (ref 70–99)
Glucose-Capillary: 158 mg/dL — ABNORMAL HIGH (ref 70–99)
Glucose-Capillary: 167 mg/dL — ABNORMAL HIGH (ref 70–99)
Glucose-Capillary: 58 mg/dL — ABNORMAL LOW (ref 70–99)
Glucose-Capillary: 67 mg/dL — ABNORMAL LOW (ref 70–99)
Glucose-Capillary: 79 mg/dL (ref 70–99)
Glucose-Capillary: 79 mg/dL (ref 70–99)
Glucose-Capillary: 81 mg/dL (ref 70–99)
Glucose-Capillary: 85 mg/dL (ref 70–99)
Glucose-Capillary: 97 mg/dL (ref 70–99)
Glucose-Capillary: 99 mg/dL (ref 70–99)

## 2010-05-21 LAB — LACTIC ACID, PLASMA
Lactic Acid, Venous: 0.8 mmol/L (ref 0.5–2.2)
Lactic Acid, Venous: 1.3 mmol/L (ref 0.5–2.2)

## 2010-05-21 LAB — URINE CULTURE
Colony Count: NO GROWTH
Colony Count: >=100000

## 2010-05-21 LAB — URINALYSIS, ROUTINE W REFLEX MICROSCOPIC
Bilirubin Urine: NEGATIVE
Glucose, UA: NEGATIVE mg/dL
Glucose, UA: NEGATIVE mg/dL
Ketones, ur: NEGATIVE mg/dL
Ketones, ur: NEGATIVE mg/dL
Nitrite: NEGATIVE
Nitrite: POSITIVE — AB
Protein, ur: NEGATIVE mg/dL
Protein, ur: NEGATIVE mg/dL
Protein, ur: NEGATIVE mg/dL
Specific Gravity, Urine: 1.007 (ref 1.005–1.030)
Specific Gravity, Urine: 1.031 — ABNORMAL HIGH (ref 1.005–1.030)
Urobilinogen, UA: 0.2 mg/dL (ref 0.0–1.0)
Urobilinogen, UA: 0.2 mg/dL (ref 0.0–1.0)
pH: 5.5 (ref 5.0–8.0)

## 2010-05-21 LAB — RETICULOCYTES
RBC.: 2.65 MIL/uL — ABNORMAL LOW (ref 3.87–5.11)
RBC.: 3.08 MIL/uL — ABNORMAL LOW (ref 3.87–5.11)
Retic Count, Absolute: 100.7 10*3/uL (ref 19.0–186.0)
Retic Count, Absolute: 92.4 10*3/uL (ref 19.0–186.0)
Retic Ct Pct: 3.8 % — ABNORMAL HIGH (ref 0.4–3.1)
Retic Ct Pct: 3 % (ref 0.4–3.1)

## 2010-05-21 LAB — HEMOCCULT GUIAC POC 1CARD (OFFICE)
Fecal Occult Bld: POSITIVE
Fecal Occult Bld: NEGATIVE

## 2010-05-21 LAB — BRAIN NATRIURETIC PEPTIDE
Pro B Natriuretic peptide (BNP): 2342 pg/mL — ABNORMAL HIGH (ref 0.0–100.0)
Pro B Natriuretic peptide (BNP): >3200 pg/mL — ABNORMAL HIGH (ref 0.0–100.0)

## 2010-05-21 LAB — CLOSTRIDIUM DIFFICILE EIA: C difficile Toxins A+B, EIA: NEGATIVE

## 2010-05-21 LAB — GIARDIA/CRYPTOSPORIDIUM SCREEN(EIA)
Cryptosporidium Screen (EIA): NEGATIVE
Giardia Screen - EIA: NEGATIVE

## 2010-05-21 LAB — TROPONIN I: Troponin I: 0.1 ng/mL — ABNORMAL HIGH (ref 0.00–0.06)

## 2010-05-21 LAB — FECAL LACTOFERRIN, QUANT: Fecal Lactoferrin: POSITIVE

## 2010-05-21 LAB — CORTISOL: Cortisol, Plasma: 33.5 ug/dL

## 2010-05-21 LAB — PROTIME-INR
INR: 1.03 (ref 0.00–1.49)
Prothrombin Time: 13.7 seconds (ref 11.6–15.2)

## 2010-05-21 LAB — CROSSMATCH
ABO/RH(D): O POS
Antibody Screen: NEGATIVE

## 2010-05-21 LAB — PROLACTIN: Prolactin: 30 ng/mL

## 2010-05-21 LAB — STOOL CULTURE

## 2010-05-21 LAB — BASIC METABOLIC PANEL WITH GFR
BUN: 17 mg/dL (ref 6–23)
CO2: 16 meq/L — ABNORMAL LOW (ref 19–32)
Calcium: 8 mg/dL — ABNORMAL LOW (ref 8.4–10.5)
Creatinine, Ser: 1.44 mg/dL — ABNORMAL HIGH (ref 0.4–1.2)
Glucose, Bld: 111 mg/dL — ABNORMAL HIGH (ref 70–99)
Sodium: 136 meq/L (ref 135–145)

## 2010-05-21 LAB — URINE MICROSCOPIC-ADD ON

## 2010-05-21 LAB — IRON AND TIBC
Saturation Ratios: 29 % (ref 20–55)
TIBC: 199 ug/dL — ABNORMAL LOW (ref 250–470)

## 2010-05-21 LAB — PREALBUMIN: Prealbumin: 11.4 mg/dL — ABNORMAL LOW (ref 18.0–45.0)

## 2010-05-21 LAB — POTASSIUM
Potassium: 4 mEq/L (ref 3.5–5.1)
Potassium: 3.9 mEq/L (ref 3.5–5.1)

## 2010-05-21 LAB — HEMOGLOBIN A1C
Mean Plasma Glucose: 91 mg/dL (ref <117)
Mean Plasma Glucose: 94 mg/dL (ref <117)

## 2010-05-21 LAB — CLOSTRIDIUM DIFFICILE BY PCR: Toxigenic C. Difficile by PCR: NOT DETECTED

## 2010-05-21 LAB — FOLATE: Folate: 4.3 ng/mL

## 2010-05-21 LAB — POCT CARDIAC MARKERS
CKMB, poc: 1.4 ng/mL (ref 1.0–8.0)
Myoglobin, poc: 161 ng/mL (ref 12–200)
Troponin i, poc: <0.05 ng/mL (ref 0.00–0.09)

## 2010-05-21 LAB — SEDIMENTATION RATE: Sed Rate: 8 mm/hr (ref 0–22)

## 2010-05-21 LAB — TSH: TSH: 0.15 u[IU]/mL — ABNORMAL LOW (ref 0.350–4.500)

## 2010-05-21 LAB — MRSA PCR SCREENING: MRSA by PCR: NEGATIVE

## 2010-07-21 NOTE — Assessment & Plan Note (Signed)
OFFICE VISIT   [name] [name]  DOB:  06-19-1940                                       01/19/2007  CHART#:[id number]   Patient is a 70 year old vasculopath with multiple previous procedures.  She has undergone bilateral carotid endarterectomies, coronary artery  bypass, aortofemoral bypass with right renal artery bypass and also  right femoral popliteal bypass.   She has a history of type 2 diabetes, hyperlipidemia, coronary artery  disease, peripheral vascular disease, and COPD.   She has known right common iliac artery occlusion and is followed here  at the office regularly with carotid Doppler evaluation.  The carotid  Doppler carried out in July of this year reveals chronic occlusion of  the right internal carotid artery.  Left carotid endarterectomy site  reveals mild restenosis.   No lower extremity Dopplers have been carried out for approximately a  year.  In November of last year, ankle brachial indices measured 0.83 on  the right and 0.27 on the left.  Patient denies any recurrent ulceration  or leg swelling.   PHYSICAL EXAMINATION:  Patient is a 70 year old female who appears older  than her stated age.  She is alert and oriented.  No acute distress.  BP  is 139/69, pulse 78 per minute and regular, respirations 18 per minute,  O2 sat 99%.  No carotid bruits.  Heart sounds are normal without  murmurs.  Chest is clear without rales or rhonchi.  Lower extremities  reveal 2+ femoral pulses bilaterally.  No popliteal, posterior tibial,  or dorsalis pedis pulses.  No ankle edema.  Heels:  Ulcerations are  noted bilaterally.  Cranial nerves are intact.  Strength equal  bilaterally.  Reflexes 2+.   PLAN:  Continue maximal medical therapy for extracranial cerebrovascular  occlusive disease.  Continue to monitor left carotid bifurcation for  recurrent disease.  Follow also monitoring of chronic peripheral  vascular disease.  Will return to see [place] by  protocol in January for  lower extremity Dopplers and carotid Doppler.   Dorothea Glassman, M.D.  Electronically Signed   PGH/MEDQ  D:  01/19/2007  T:  01/20/2007  Job:  [id number]   cc:   [name], M.D.

## 2010-07-21 NOTE — Procedures (Signed)
CAROTID DUPLEX EXAM   INDICATION:  Followup known carotid artery disease.   HISTORY:  Diabetes:  Insulin dependent.  Cardiac:  CABG - 1997.  Hypertension:  Yes.  Smoking:  Yes.  Previous Surgery:  Status post bilateral CEA, now with right CCA  occlusion per angiography.  CV History:  Intermittent dizziness when bent over.  Amaurosis Fugax No, Paresthesias No, Hemiparesis No                                       RIGHT             LEFT  Brachial systolic pressure:         98                124  Brachial Doppler waveforms:         Biphasic          Biphasic  Vertebral direction of flow:        Antegrade         Antegrade  DUPLEX VELOCITIES (cm/sec)  CCA peak systolic                   PROX - 30 abnormal.                 [id number]  ECA peak systolic                   Occluded          [id number]  ICA peak systolic                   52                123456  ICA end diastolic                   9                 49  PLAQUE MORPHOLOGY:                  Mixed             Mixed  PLAQUE AMOUNT:                      Severe            Mild - moderate  PLAQUE LOCATION:                    CCA/ICA/ECA       CCA/ICA   IMPRESSION:  1. The right common carotid artery appears occluded mid to distal.  2. The right external carotid artery appears occluded past proximal.  3. The right internal carotid artery appears to have minimal antegrade      flow fed by retrograde external carotid artery, though technically      difficult to determine collateral feed.  4. The left common carotid artery and internal carotid artery show      evidence of 40-59% stenosis.   ___________________________________________  P. Drucie Opitz, M.D.   AS/MEDQ  D:  09/08/2006  T:  09/08/2006  Job:  [id number]

## 2010-07-21 NOTE — Procedures (Signed)
CAROTID DUPLEX EXAM   INDICATION:  Followup carotid artery disease.   HISTORY:  Diabetes:  Yes.  Cardiac:  CABG.  Hypertension:  Yes.  Smoking:  Yes.  Previous Surgery:  Bilateral CEA with known right CCA and ICA occlusion.  CV History:  No.  Amaurosis Fugax No, Paresthesias No, Hemiparesis No                                       RIGHT             LEFT  Brachial systolic pressure:         156               154  Brachial Doppler waveforms:         Biphasic          Biphasic  Vertebral direction of flow:        Antegrade         Antegrade  DUPLEX VELOCITIES (cm/sec)  CCA peak systolic                   24 (abnormal)     95  ECA peak systolic                   61                123456  ICA peak systolic                   Occluded          99991111  ICA end diastolic                   Occluded          57  PLAQUE MORPHOLOGY:                  Mixed             Heterogenous  PLAQUE AMOUNT:                      Severe            Moderate  PLAQUE LOCATION:                    ICA/CCA/ECA       ICA/ECA/CCA   IMPRESSION:  1. Known right ICA and CCA occlusion.  2. Left ICA shows evidence of 60-79% stenosis (low end of range).  3. Status post bilateral CEA.   ___________________________________________  P. Drucie Opitz, M.D.   AS/MEDQ  D:  11/03/2007  T:  11/03/2007  Job:  [id number]

## 2010-07-21 NOTE — Procedures (Signed)
BYPASS GRAFT EVALUATION   INDICATION:  Followup right lower extremity bypass graft.   HISTORY:  Diabetes:  Yes.  Cardiac:  CABG.  Hypertension:  Yes.  Smoking:  Yes.  Previous Surgery:  Right femoral-popliteal artery bypass graft.   SINGLE LEVEL ARTERIAL EXAM                               RIGHT              LEFT  Brachial:                    156                154  Anterior tibial:             157                70  Posterior tibial:            162                62  Peroneal:  Ankle/brachial index:        1.04               0.45   PREVIOUS ABI:  Date:  05/04/2007  RIGHT:  1.05  LEFT:  0.40   LOWER EXTREMITY BYPASS GRAFT DUPLEX EXAM:   DUPLEX:  Doppler arterial waveforms appears biphasic proximal to, within  and distal to bypass graft.   IMPRESSION:  1. Patent right femoral-popliteal artery bypass graft.  2. Stable ABIs bilaterally.   ___________________________________________  P. Drucie Opitz, M.D.   AS/MEDQ  D:  11/03/2007  T:  11/03/2007  Job:  [id number]

## 2010-07-21 NOTE — Procedures (Signed)
BYPASS GRAFT EVALUATION   INDICATION:  Followup right fem-pop bypass graft.   HISTORY:  Diabetes:  Yes.  Cardiac:  Yes, CABG.  Hypertension:  Yes.  Smoking:  Yes.  Previous Surgery:  Please see above.   SINGLE LEVEL ARTERIAL EXAM                               RIGHT              LEFT  Brachial:                    97                 106  Anterior tibial:             106                51  Posterior tibial:            111                61  Peroneal:  Ankle/brachial index:        1.05               0.40   PREVIOUS ABI:  Date:  RIGHT:  LEFT:   LOWER EXTREMITY BYPASS GRAFT DUPLEX EXAM:   DUPLEX:  Patent right fem-pop bypass graft with no evidence of focal  stenosis.   IMPRESSION:  1. Patent right fem-pop bypass graft with no evidence of focal      stenosis.  2. Normal ABI with biphasic Doppler waveform noted in the right leg.      Status post right fem-pop bypass graft.  3. Moderately abnormal ABI with monophasic Doppler waveform noted in      the left leg.       ___________________________________________  P. Drucie Opitz, M.D.   MG/MEDQ  D:  05/04/2007  T:  05/05/2007  Job:  [id number]

## 2010-07-21 NOTE — Procedures (Signed)
CAROTID DUPLEX EXAM   INDICATION:  Followup known carotid artery disease.   HISTORY:  Diabetes:  Yes.  Cardiac:  CABG in 1997.  Hypertension:  Yes.  Smoking:  Yes.  Previous Surgery:  Bilateral carotid endarterectomy.  CV History:  Amaurosis Fugax Yes No, Paresthesias Yes No, Hemiparesis Yes No.                                       RIGHT             LEFT  Brachial systolic pressure:         97                100  Brachial Doppler waveforms:         Biphasic          Biphasic  Vertebral direction of flow:        Antegrade         Antegrade  DUPLEX VELOCITIES (cm/sec)  CCA peak systolic                   108               0000000  ECA peak systolic                   62                123456  ICA peak systolic                     Occluded        [id number]  ICA end diastolic                     Occluded        96  PLAQUE MORPHOLOGY:                    Calcified       Heterogeneous  PLAQUE AMOUNT:                      Severe            Moderate  PLAQUE LOCATION:                    ICA and ECA and CCA                 ICA and ECA   IMPRESSION:  1. Occluded right ICA and CCA.  2. In the left ICA 60-79% stenosis noted.  3. Status post bilateral carotid endarterectomy.  4. Antegrade bilateral vertebral arteries.   ___________________________________________  P. Drucie Opitz, M.D.   MG/MEDQ  D:  05/04/2007  T:  05/05/2007  Job:  [id number]

## 2010-07-21 NOTE — Assessment & Plan Note (Signed)
OFFICE VISIT   [name] [name], [name] [name]  DOB:  01-26-1941                                       12/25/2009  CHART#:[id number]   The patient returns for followup today.  She underwent thrombectomy of  her right fem-pop bypass after cardiac catheterization on 12/09/2009.  She states she has had no drainage from her incisions.  She denies any  fevers or chills.   On physical exam today blood pressure is 118/68 in the left arm, heart  rate 70 and regular.  Temperature is 98.  Right groin incision is well-  healed.  There is no erythema.  There is no drainage.  She has a 2+  dorsalis pedis pulse in the right foot.   Overall the patient seems to be doing well at this point.  She is  returning to normal activities.  She will follow up with [place] in one  year's time for a repeat carotid duplex exam and a scan of her lower  extremities to follow up on her peripheral arterial disease.     Jessy Oto. Fields, MD  Electronically Signed   CEF/MEDQ  D:  12/25/2009  T:  12/26/2009  Job:  [id number]   cc:   Jeanella Craze. Little, M.D.  Revonda Standard Roxan Hockey, M.D.

## 2010-07-24 NOTE — Op Note (Signed)
NAME:  [name] [name], [name] [name]              ACCOUNT NO.:  [id number]   MEDICAL RECORD NO.:  [id number]          PATIENT TYPE:  AMB   LOCATION:  SDS                          FACILITY:  [facility]   PHYSICIAN:  Rosetta Posner, M.D.    DATE OF BIRTH:  1940/09/03   DATE OF PROCEDURE:  12/23/2004  DATE OF DISCHARGE:  12/23/2004                                 OPERATIVE REPORT   PREOPERATIVE DIAGNOSIS:  Bilateral extremity arterial insufficiency.   POSTOPERATIVE DIAGNOSIS:  Bilateral extremity arterial insufficiency.   PROCEDURE:  Aortogram bilateral inferior runoff.   SURGEON:  Rosetta Posner, M.D.   ANESTHESIA:  1% lidocaine local.   COMPLICATIONS:  None.   DISPOSITION:  Holding area stable.   DESCRIPTION OF PROCEDURE:  The patient was taken to the peripheral vascular  cath lab, placed in supine position where area of both groins were prepped  and draped in the usual sterile fashion.  Using local anesthesia, and a  Seldinger technique, a guidewire was passed up to the level of the  suprarenal aorta. A 5-French sheath was passed over the guidewire and the  pigtail catheter was positioned at the level of the suprarenal aorta.  Flush  aortogram was obtained in the AP projection. This revealed prior  aortofemoral bypass which was widely patent. The patient had patent single  renal arteries bilaterally with moderate stenosis in the left renal artery.  Right renal artery arose from the aortofemoral bypass. There was no evidence  of stenosis. Next, the pigtail catheter was exchanged for an IMA catheter  and the left limb of the graft was selectively catheterized. Runoff was  obtained with a bolus chase technique. This revealed patency of the  anastomosis to the junction of the common superficial femoral artery. The  patient had patent profundus femoris branches.  The superficial femoral  artery was patent with extensive subtotal occlusions throughout its course  proximally and then was occluded at the  adductor canal. There was  reconstitution of the atony popliteal artery with three-vessel runoff with  the posterior tibial being the dominant runoff vessel. Additional subtracted  views were obtained at the knee, again showing patent below-knee popliteal  artery with three-vessel runoff. The IMA catheter was then removed and right  leg runoff was obtained via the right femoral sheath. Again this showed  widely patent anastomosis to the junction of the profundus femoris artery.  There was complete occlusion of the superficial femoral artery throughout  its course. There was reconstitution of the below-knee popliteal artery  again with a three-vessel runoff with no evidence of significant tibial  disease. The patient tolerated the procedure well without any complication  and was transferred to the holding area in stable condition.     Rosetta Posner, M.D.  Electronically Signed    TFE/MEDQ  D:  02/09/2005  T:  02/09/2005  Job:  [id number]

## 2010-07-24 NOTE — Consult Note (Signed)
NAME:  [name] [name], [name] [name]              ACCOUNT NO.:  [id number]   MEDICAL RECORD NO.:  [id number]          PATIENT TYPE:  INP   LOCATION:  NA                           FACILITY:  [facility]   PHYSICIAN:  [name] P. Leonie Man, MD    DATE OF BIRTH:  1940-07-08   DATE OF CONSULTATION:  03/23/2006  DATE OF DISCHARGE:                                 CONSULTATION   REFERRING PHYSICIAN:  Dorothea [name], M.D.   REASON FOR REFERRAL:  Recurrent carotid stenosis.   HISTORY OF PRESENT ILLNESS:  [name] [name] is a 70 year old Caucasian lady  who has a known history of longstanding extensive vascular disease.  She  had a right carotid endarterectomy in 1997 along with 6-vessel coronary  artery bypass graft surgery.  She has had no known symptoms of right  hemispheric ischemia in the past for as well as recently.  She had  outpatient followup for the right carotid stenosis and was found to have  mild recurrence, but recently she was found to have severe recurrent  stenosis by ultrasound in May 2007.  Subsequently, she underwent  cerebral angiogram on December 02, 2004, by [name] [name] which showed  95% recurrent right ICA stenosis, 80-90% tight right vertebral ostial  stenosis, and occluded left vertebral artery at the origin.  The patient  was initially seen by [name] [name] in the office in June 2007 and was  supposed to have a right carotid stent, but the procedure was postponed.  She was subsequently seen in the office and had transcranial Doppler  studies done which showed elevated right middle cerebral artery  velocities. On August 10, 2005, transcranial Doppler with reactivity  studies were abnormal indicative of compromised right hemispheric  vascular reserve, and transcranial Doppler emboli monitoring did not  reveal any significant emboli at rest.   PAST MEDICAL HISTORY:  Significant for:  1. Hypertension.  2. Hyperlipidemia.  3. Diabetes.  4. Coronary artery disease.  5. Smoking.   PAST SURGICAL  HISTORY:  1. Cardiac bypass graft surgery.  2. Aortobifemoral bypass.  3. Right femoral-popliteal bypass.  4. Bilateral carotid endarterectomies.   ALLERGIES TO MEDICATIONS:  None known.   SOCIAL HISTORY:  She is divorced, has one daughter.  Smokes 1/2 pack per  day and seldom drinks alcohol.   FAMILY HISTORY:  Positive for stroke.   HOME MEDICATIONS:  1. TriCor 145 mg daily.  2. Hydrocodone three times a day.  3. Diovan 160 mg daily.  4. Metformin 500 daily.  5. Neurontin 300 daily.  6. Glipizide XL 2.5 daily.  7. Amlodipine 5 mg daily.  8. Plavix 75 daily.  9. Synthroid 0.15 daily.  10.Pletal 100 twice a day.  11.Magnesium twice a day.  12.Crestor 20 mg daily.   REVIEW OF SYSTEMS:  Negative for any fever, cough, chest pain, diarrhea  or other illness.   PHYSICAL EXAMINATION:  GENERAL:  A pleasant middle-aged Caucasian lady  who is not in distress.  VITAL SIGNS: Afebrile, pulse rate 70 per minute and regular, respiratory  rate 16 per minute.  NECK:  Reveals bilateral carotid endarterectomy scars.  There is a harsh  left carotid and soft right carotid bruit heard.  There is no culal  bruit heard.  CARDIAC: Regular heart sounds.  LUNGS:  Clear to auscultation.  NEUROLOGICAL EXAM: The patient is pleasant, awake, alert, cooperative  with no aphasia or  dysarthria.  Pupils irregular but reactive, and  movements are full range without nystagmus.  Visual acuity and fields  are accurate.  Face is symmetric.  Palatal movements normal.  Tongue is  midline.  Motor system exam reveals no upper extremity drift, symmetric  strength, normal reflexes coordination, sensation.  Gait was not tested.   On NIH stroke scale, she is code zero.  Modified Rankin scale is zero.  Data reviewed as stated above.   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   PLAN:  The patient is clearly at high risk for right hemispheric  strokes, and she meets SAPPHIRE criteria for high risk for carotid  endarterectomy and, hence, may participate in the capture 2 stent  registry.  She will stay on aspirin and Plavix and have elective  followup with me in the future in the office if needed.  Recommend 6-  month ultrasound followup of left carotid stenosis.   Continue aggressive risk factor modification with strict control of  hypertension with systolic blood pressure goal below 113, hyperlipidemia  with LDL goal below 70, and diabetes with hemoglobin A1c goal below 7.  The patient should also quit smoking.   Thank you for the referral.           ______________________________  [name] Rhodes. Leonie Man, MD     PPS/MEDQ  D:  03/23/2006  T:  03/23/2006  Job:  [id number]

## 2010-07-24 NOTE — Consult Note (Signed)
NAME:  [name], [name]              ACCOUNT NO.:  [id number]   MEDICAL RECORD NO.:  [id number]          PATIENT TYPE:  INP   LOCATION:  5707                         FACILITY:  [facility]   PHYSICIAN:  Raelyn Ensign. Santogade, M.D.DATE OF BIRTH:  07/25/1940   DATE OF CONSULTATION:  11/21/2004  DATE OF DISCHARGE:                                   CONSULTATION   [name] [name] is a 70 year old female whom I am asked to see for progressive  anemia with microcytic indices and guaiac positive stool. She is a long-  standing type 2 diabetic with multiple vascular problems. Hemoglobin  reportedly has been generally around 9 and she has been receiving oral and  intravenous iron. Recently she had an adjustment in her diabetic medications  and had persisting hypoglycemia which precipitated admission. However she  also had some vomiting which may have been related to taking Biaxin for leg  ulcers. She denies dysphagia, epigastric, renal or abdominal pain, visible  melena or hematochezia. She has never had a colonoscopy in the past decade.   PAST MEDICAL HISTORY:  1.  Pertinent for diabetes mellitus.  2.  Leg ulcers.  3.  Hypothyroidism.  4.  Microcytic anemia.   PAST SURGICAL HISTORY:  Hysterectomy for ovarian cysts and fibroids.  Coronary artery bypass grafting. Carotid endarterectomy. Status post  aortofemoral bypass surgery.   OUTPATIENT MEDICATIONS:  1.  Glucophage 500 mg q.a.m. and 1000 mg at night (on hold).  2.  Actos 30 mg (on hold).  3.  Clarithromycin changed in hospital to Keflex.  4.  Tricor.  5.  Pletal.  6.  Glipizide 2.5 mg daily.  7.  Crestor 20 mg daily.  8.  Aciphex changed in hospital to Protonix b.i.d.  9.  Synthroid 0.15 mcg daily.  10. Feosol.  11. Lasix 40 mg q.a.m.   ALLERGIES:  None reported.   FAMILY HISTORY:  Noncontributory.   SOCIAL HISTORY:  Still smokes a pack of cigarettes per day.   REVIEW OF SYSTEMS:  GENERAL:  No weight loss or night sweats. ENDOCRINE:  Both  diabetic and hypothyroid. SKIN:  No rash or pruritus. EYES:  No icterus  or change in vision. ENT:  No aphthous ulcer or chronic sore throat.  RESPIRATORY:  Probable emphysema. CARDIAC:  As above. GI:  As above.   PHYSICAL EXAMINATION:  GENERAL:  She is an overweight adult female in no  acute distress.  VITAL SIGNS:  Afebrile. Blood pressure 107/56, pulse 50 and regular.  SKIN:  Normal.  EYES:  Anicteric, conjunctivae pale.  OROPHARYNX:  Unremarkable.  CHEST:  Distant clear breath sounds.  HEART:  The patient appears to be in a bigeminal rhythm at the current  moment. There are no murmurs.  ABDOMEN:  Soft without mass, tenderness or organomegaly. There is no rebound  or hernia. Bowel sounds are normal.  RECTAL EXAM:  Not repeated.  EXTREMITIES:  Reveal 1 to 2+ bilateral edema with dressings over distal  ulcers.   IMPRESSION:  A 70 year old diabetic smoker with microcytic anemia and guaiac  positive stool. Both upper and lower endoscopy are indicated to  rule out  peptic or neoplastic problems. The procedures of upper and lower endoscopy  are discussed with the patient and reviewed in terms of technique,  preparation and risks and complications including bleeding and perforation.  She agrees to proceed. They will be scheduled in the morning. Please see the  orders.      Raelyn Ensign. Vladimir Faster, M.D.  Electronically Signed     PJS/MEDQ  D:  11/21/2004  T:  11/21/2004  Job:  [id number]

## 2010-07-24 NOTE — Discharge Summary (Signed)
NAME:  [name] [name], [name] [name]              ACCOUNT NO.:  [id number]   MEDICAL RECORD NO.:  [id number]          PATIENT TYPE:  INP   LOCATION:  2039                         FACILITY:  [facility]   PHYSICIAN:  Dorothea Glassman, M.D.    DATE OF BIRTH:  11-08-1940   DATE OF ADMISSION:  01/12/2005  DATE OF DISCHARGE:  01/15/2005                                 DISCHARGE SUMMARY   PRIMARY DISCHARGE DIAGNOSES:  1.  Peripheral vascular disease.  2.  Non-healing venous stasis ulcers right lower extremity.   SECONDARY DIAGNOSES:  1.  Diabetes mellitus.  2.  Extracranial cerebrovascular occlusive disease.  3.  Coronary artery disease status post myocardial infarction.  4.  History of congestive heart failure.  5.  Peripheral vascular occlusive disease.  6.  Aortoiliac occlusive disease.  7.  Venous stasis disease.  8.  Arthritis.  9.  Hypertension.  10. Hypothyroidism.  11. Hyperlipidemia.  12. Renal artery stenosis.  13. Status post hysterectomy.  14. Status post bilateral carotid endarterectomy.  15. Status post coronary artery bypass grafting.  16. Aortobifemoral bypass graft with right renal artery bypass.   ALLERGIES:  No known drug allergies.   OPERATIONS/PROCEDURES/TREATMENT:  1.  Repair of right femoral pseudoaneurysm.  2.  Right femoral popliteal bypass with 6 mm Gore-Tex graft.  3.  Debridement of venous stasis ulcers x2, right lower extremity.   HISTORY OF PRESENT ILLNESS:  The patient is a 70 year old female well known  to [facility], who has undergone multiple previous re-vascularization procedure  including coronary artery bypass grafting, bilateral carotid endarterectomy,  and aortobifemoral bypass grafting with right renal artery bypass in the  past. The patient recently had a TIA event, which during evaluation, she was  found to have significant restenosis of previously operated on right carotid  endarterectomy sites with approximately 95% stenosis in internal carotid  artery. The  patient was felt to be a candidate for future stent grafting,  however, the patient currently has bilateral lower extremity ulcerations  with significant peripheral vascular occlusive disease with ABI's in the  range of 0.43 bilaterally. The patient had severe pain at the ulceration  sites. Ulcerations have been managed aggressively at the wound care center  with dressing changes and local wound care with debridement ointment. They  unfortunately are not healing. The patient seen and evaluated by Dr. Amedeo Plenty.  Dr. Amedeo Plenty discussed undergoing re-vascularization. He discussed the risks  and benefits. The patient acknowledged understanding. She was scheduled for  surgery for January 12, 2005.   HOSPITAL COURSE:  The patient was taken to the operating room on January 12, 2005 where she underwent repair of right femoral pseudoaneurysm with right  femoral popliteal bypass with 6 mm Gore-Tex graft and debridement of venous  stasis ulcers x2 right lower extremity. The patient tolerated the procedure  well and was transferred to the Intensive Care Unit in stable condition.  Postoperative day 69, the patient underwent postoperative ABI's, which showed  right 0.82, which is increased from 0.43, and left 0.37, which is decreased  from 0.43. The patient's postoperative course is pretty much unremarkable.  She was transferred down to 2000 on postoperative day 1. She did develop  acute blood loss anemia postoperatively, which did require 1 unit of packed  red blood cells postoperative day 1. Following transfusion, her hemoglobin  and hematocrit increased appropriately and remained stable. Her bilateral  lower extremity ulcers were treated with hydrogel, Kerlix, and ace wraps and  changed twice a day. Physical therapy was consulted postoperatively. They  did work with patient during her hospital stay and recommended home health  PT at discharge. The patient's incisions from her surgical sites are dry and   intact and healing well. The patient's right femoral popliteal graft  remained patent and her right foot was warm and well perfused and good  Dopplerable DP/PT pulses noted. The patient remained in normal sinus rhythm  prior to discharge.   DISPOSITION:  The patient is tentatively ready for discharge January 17, 2005. Again, home health nurse was arranged for wound care dressing changes,  which is to use Hydrogel, 4x4s, saline, Kerlix and ace, done twice a day.  Home health physical therapy was also arranged for reconditioning. A  followup appointment will be scheduled with Dr. Amedeo Plenty in 3 weeks. The  patient will need to see a nurse in 2 weeks for staple removal.   DISCHARGE INSTRUCTIONS:  Ms. Youman received instructions on diet, activity  level, and incisional care.   ACTIVITY:  She was told to driving until released to do so. No heavy lifting  over 10 pounds. She is told that she is to sponge bathe only and do not get  in the shower and get her ulcerations wet.   DIET:  The patient is educated on diet, to be low-fat, low-salt.   SPECIAL INSTRUCTIONS:  She is to contact the office if she develops any  drainage or opening from any of her incision sites or fever greater than  101.1 degrees Fahrenheit.   DISCHARGE MEDICATIONS:  1.  Aspirin 325 mg p.o. q.d.  2.  Lasix 40 mg p.o. q.d.  3.  Synthroid 150 mcg daily.  4.  Glipizide 2.5 mg daily.  5.  Crestor 20 mg q.h.s.  6.  Tricor 145 mg daily.  7.  Potassium chloride 20 meq daily.  8.  Cilostazol 100 mg b.i.d.  9.  Neurontin 300 mg q.h.s.  10. Glucophage 750 mg daily.  11. Oxycodone 5 mg p.o. q.4-6h. p.r.n. pain.      Faylene Million Dominick, PA      P. Drucie Opitz, M.D.  Electronically Signed    KMD/MEDQ  D:  01/15/2005  T:  01/17/2005  Job:  [id number]

## 2010-07-24 NOTE — Consult Note (Signed)
NAME:  [name] [name], [name] [name]              ACCOUNT NO.:  [id number]   MEDICAL RECORD NO.:  [id number]            PATIENT TYPE:   LOCATION:                                 FACILITY:   PHYSICIAN:  San Morelle, MD     DATE OF BIRTH:   DATE OF CONSULTATION:  DATE OF DISCHARGE:                                 CONSULTATION   Dear Dr. Albertine Waynesha,   Thank you for the consultation regarding intracranial imaging performed  during Ms. Haynes Dage recent carotid angiogram.  I have reviewed  the images.  My findings are as follow:   The left common carotid artery injection demonstrates a patent anterior  communicating artery with flow to the right ACA and MCA territory.  Right MCA territory is slightly less robust than on the left.  There is  minimal retrograde flow into the terminal ICA on the right.  No focal  stenosis, aneurysm, or branch vessel occlusion is seen.  Lateral images  are somewhat grainy.  This may be secondary to patient motion or  technique.  The right vertebral artery was not injected; although,  incidental imaging of the neck demonstrates this is likely a robust  vessel.  This may also contribute to MCA perfusion but was not evaluated  on this examination.   IMPRESSION:  1. Occluded right internal carotid artery with minimal retrograde flow      in the ICA terminus.  2. Flow to the right ACA and MCA territories revealed a patent      anterior communicating artery and flow from the left.  3. No significant intracranial proximal stenosis, aneurysm, or branch      vessel occlusion.   Thank you again for allowing me to review these images.  If I can be of  any further assistance, please do not hesitate to call.           ______________________________  San Morelle, MD     CM/MEDQ  D:  04/06/2006  T:  04/06/2006  Job:  [id number]

## 2010-07-24 NOTE — Consult Note (Signed)
NAME:  Jaros, [name] [name]              ACCOUNT NO.:  [id number]   MEDICAL RECORD NO.:  [id number]           PATIENT TYPE:   LOCATION:                                 FACILITY:   PHYSICIAN:  Epifania Gore. Nils Pyle, M.D.     DATE OF BIRTH:   DATE OF CONSULTATION:  12/03/2004  DATE OF DISCHARGE:                                   CONSULTATION   REASON FOR CONSULTATION:  This 70 year old female is referred by Dr. Dwyane Dee  for evaluation and management of bilateral lower extremity ulcerations.   IMPRESSION:  1.  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  2.  A full-thickness stage II sacral decubitus.   RECOMMENDATIONS:  Wound management should follow the final disposition on  the patient's symptomatic carotid stenosis. Following her evaluation by Dr.  Deitra Mayo from the vascular surgery service, we will be happy to  reevaluate and manage Mrs. Eischen's wounds. At this time, the more ominous  lesion which needs priority medical attention would be the symptomatic  carotid stenosis.   During this consultation we proceeded with a thorough full-thickness  debridement of all of her wounds and instructed the caretaker in the  technique of wet-to-dry dressings and the utilization of enzymatic  debridement. The daughter is comfortable with the demonstrated techniques  and we have urged them to keep their appointment with Dr. Scot Dock.   SUBJECTIVE:  [name] is a 70 year old smoker with an extensive vascular  history involving previous bilateral carotid endarterectomies, coronary  artery bypass grafting, and aortobifemoral grafting. She is also a diabetic  and has been under the management of Dr. Dwyane Dee. Her capillary blood glucose  today is 121 mg%. She does not recall her most recent hemoglobin A1c. Her  current medication list includes:  An iron supplement, aspirin 81 mg a day,  furosemide 40 mg daily, Actos 30 mg daily, Synthroid 0.15, glipizide ER  2.5  mg, Crestor 40 mg per day, cilostazol 100 mg b.i.d., TriCor 145 mg daily,  metformin 1000 mg daily, metformin 250 mg t.i.d., and Avelox 400 mg daily  for 10 days. This was initiated in January and has now expired.   SURGICAL HISTORY:  Is as per above.   FAMILY HISTORY:  Is positive for diabetes, hypertension, and stroke.   SOCIAL HISTORY:  The patient is retired. She lives in [place]. She is  attended by her daughter.   REVIEW OF SYSTEMS:  Is specifically negative for angina pectoris but she  does describe episodic shortness of breath. She denies fainting. She has  recently had classic eye changes with loss of vision temporarily and has  undergone an arteriogram which demonstrated an 85% stenosis (restenosis) of  her left internal carotid artery per patient report. In addition, she has  recently undergone an arteriogram for peripheral vascular assessment per Dr.  Quay Burow. She denies bowel or bladder dysfunction. She has had a  microcytic anemia which has been thoroughly evaluated with both upper and  lower endoscopy with no etiology discerned. The remainder review of systems  is negative.  PHYSICAL EXAMINATION:  GENERAL:  She is an alert, oriented female, appears  somewhat older than her stated age. She is in no acute distress.  HEENT:  Exam is clear.  NECK:  Supple. There are bilateral carotid incisions which are  well healed.  Her trachea is midline.  HEART:  The heart rhythm is regular.  LUNGS:  Clear.  ABDOMEN:  Is postsurgical.  EXTREMITIES:  Femoral pulses are palpable. Pulses are not appreciated at the  pedal level. There is bilateral 3+ edema. There are well-formed punched-out  ulcerations in both lower extremities. These have been photographed and  entered into [facility]. In addition, there is a stage 2 sacral  decubitus emanating from the medial aspect of the left buttock. This, too,  was photographed and entered into [facility]. Pedal pulses  are not  appreciated and capillary refill is extremely sluggish.  NEUROLOGIC:  The patient retains protective sensation.   DISCUSSION:  Mrs. Gallick has multiple and significant major pathologies. On  today's history it is clear that she does have symptomatic carotid stenosis  and I have encouraged her to pursue the recommendations per Dr. Scot Dock whom  she will be seeing shortly. With regard to her wounds, we have debrided  those during this consultative visit and assured them that we are willing to  assume the care of her wounds in the future. At this point, however, we have  encouraged her to keep her appointments with Dr. Scot Dock to make sure that  her extracranial occlusive disease is attended and at the same time an  assessment of her peripheral circulatory needs can be performed. After these  concerns have been addressed, we will be happy to see her on a continuing  basis at [facility] to assist in the management of  her lower extremity ulcerations. The daughter and the patient express  gratitude at having been seen and express a determination to follow the  recommendations as outlined.           ______________________________  Epifania Gore. Nils Pyle, M.D.     Rondel Oh  D:  12/03/2004  T:  12/03/2004  Job:  [id number]   cc:   [name], M.D.  Fax: [phone]   Judeth Cornfield. Scot Dock, M.D.  [address]  [place]  [place] [address]   Quay Burow, M.D.  Fax: [phone]

## 2010-07-24 NOTE — H&P (Signed)
NAME:  [name] [name], [name] [name]              ACCOUNT NO.:  [id number]   MEDICAL RECORD NO.:  [id number]          PATIENT TYPE:  AMB   LOCATION:  SDS                          FACILITY:  [facility]   PHYSICIAN:  Sanjeev K. Deveshwar, M.D.DATE OF BIRTH:  November 13, 1940   DATE OF ADMISSION:  12/02/2004  DATE OF DISCHARGE:                                HISTORY & PHYSICAL   BRIEF HISTORY:  This is a pleasant 70 year old female who was recently  admitted to [facility] September 14 by Dr. Dwyane Dee with hypoglycemia  and severe weakness associated with slurred speech.  During her stay she had  carotid Dopplers performed that showed an 80% stenosis of the right internal  carotid artery.  There was a 60-80% stenosis on the left internal carotid  artery.  Arrangements have been made for a cerebral angiogram and the  patient presents today for that procedure.   PAST MEDICAL HISTORY:  1.  Diabetes mellitus.  2.  She was recently noted to be anemic during her last hospitalization.  3.  She has a history of hyperlipidemia.  4.  Gastroesophageal reflux disease.  5.  She has bilateral lower extremity diabetic ulcers that are currently      being treated.  6.  She has a history of peripheral vascular disease and recently had a      lower extremity angiogram performed by Dr. Quay Burow.  7.  The patient also has history of coronary artery disease and she tells me      that she had a stress test performed last week, although we do not have      the results of that study.  8.  She has a history of ongoing tobacco use with probable COPD.  There has      been a history of mild hypotension.   PAST SURGICAL HISTORY:  1.  Hysterectomy.  2.  Coronary artery bypass graft surgery.  3.  Bilateral carotid endarterectomy in 1997.  4.  History of aortofemoral bypass graft.   ALLERGIES:  No known drug allergies.  Patient denies allergies to contrast  dye or to latex.   CURRENT MEDICATIONS:  1.  Metformin 1 g one  and one-half tablets daily.  2.  Potassium chloride 20 mEq daily.  3.  Metronidazole 250 mg three times daily.  4.  Avelox 400 mg daily.  5.  Aspirin 325 mg daily.  6.  Lasix 40 mg daily.  7.  Actos 30 mg daily.  8.  Synthroid 0.15 mcg daily.  9.  Glipizide ER 2.5 mg daily.  10. Crestor 40 mg daily.  11. TriCor 145 mcg daily.  12. Pletal 100 mg b.i.d.   The patient will be instructed to hold her Glucophage for two days following  the procedure.   SOCIAL HISTORY:  Patient lives alone in [place].  She has one daughter.  The patient is retired.  She continues to smoke at least one pack of  cigarettes per day and has done so for 25-30 years.  She drinks alcohol  occasionally.   FAMILY HISTORY:  Her mother died from complications  of diabetes age 30.  Her  father died from a CVA at age 65.   LABORATORY DATA:  An INR is 1.1.  A PTT was 31.  A hemoglobin 10.3,  hematocrit 30.5.  BUN 12, creatinine 1.   REVIEW OF SYSTEMS:  Completely negative except for occasional shortness of  breath which she attributes to anxiety.  She has had some blurred vision  which she feels is due to her diabetes.  She has arthritis with pain in her  back and shoulders.  She recently had guaiac-positive stool, but  subsequently had a colonoscopy.  She has a history of diabetes, as noted, a  history of anemia.  She has recently had some iron infusions.  As noted, she  recently had a lower extremity angiogram as well as a recent stress test.   PHYSICAL EXAMINATION:  GENERAL:  Pleasant 70 year old pale-appearing female  in no acute distress.  VITAL SIGNS:  Blood pressure 117/65, pulse 86, respirations 20, temperature  96.7.  HEENT:  Unremarkable.  NECK:  Left carotid bruit.  HEART:  Irregular rhythm.  LUNGS:  Decreased breath sounds, although they are clear.  ABDOMEN:  Slightly obese, soft, nontender with positive bowel sounds.  EXTREMITIES:  Lower extremities are both wrapped in gauze dressings.   NEUROLOGIC:  Mental status:  The patient is alert and oriented and follows  commands.  Cranial nerves II-XII are grossly intact.  Sensation is intact to  light touch.  Motor strength is approximately 4/5 throughout.  Cerebellar  testing is slow, but intact.  Her ASA scale is rated at a 4.  Her airway is  rated at a 2.   IMPRESSION:  1.  History of carotid artery stenosis by recent Dopplers.  The patient is      scheduled for a cerebral angiogram today to further evaluate for      cerebrovascular disease.  2.  Diabetes mellitus.  3.  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  4.  Recent anemia.  5.  Hyperlipidemia.  6.  Lower extremity diabetic ulcers.  7.  Peripheral vascular disease.  8.  Coronary artery disease.  9.  Status post coronary artery bypass graft surgery.  10. Status post bilateral carotid endarterectomies.  11. Status post aortofemoral bypass grafts.  12. Colonoscopy performed September 17 as well as endoscopy without any      signs of bleeding, although she did have diverticular disease.   PLAN:  As noted, the patient will undergo a cerebral angiogram today.  Afterwards, her Metformin will be held for at least two days.      Mikey Bussing, P.A.    ______________________________  Fritz Pickerel. Estanislado Pandy, M.D.    DR/MEDQ  D:  12/02/2004  T:  12/02/2004  Job:  [id number]   cc:   Zella Richer. Burnett Harry, M.D.  Fax: [phone]   Quay Burow, M.D.  Fax: [phone]

## 2010-07-24 NOTE — H&P (Signed)
NAME:  [name], [name]              ACCOUNT NO.:  [id number]   MEDICAL RECORD NO.:  [id number]          PATIENT TYPE:  INP   LOCATION:  NA                           FACILITY:  [facility]   PHYSICIAN:  Dorothea Glassman, M.D.    DATE OF BIRTH:  1940/03/16   DATE OF ADMISSION:  DATE OF DISCHARGE:                                HISTORY & PHYSICAL   PRIMARY CARE PHYSICIAN:  [name], M.D.   DATE OF ADMISSION:  January 12, 2005   CHIEF COMPLAINT:  Ulcers on my legs.   HISTORY OF THE PRESENT ILLNESS:  The patient is a 70 year old female well  known to [facility] and associates, having undergone multiple previous  revascularization procedures including coronary artery bypass grafting,  bilateral carotid endarterectomy, and aortobifemoral bypass grafting and  right renal artery bypass in the past. The patient recently had an event  which may have been a TIA versus a hypoglycemic event, and upon evaluation  she was found to have significant restenosis of her previously operated-on  right carotid endarterectomy site with approximately 95% stenosis in the  internal carotid artery. The patient is felt to be a candidate for future  stent grafting. However, the patient currently has bilateral lower extremity  ulcerations with significant peripheral vascular occlusive disease with  ankle-brachial index in the range of 0.43 bilaterally. The patient has  occasional calf claudication but pain is primarily related to the  ulcerations themselves. She has ongoing difficulties with peripheral edema,  although currently she feels as though it is less than usual. She shows no  evidence of current constitutional symptoms and denies fevers or chills. The  ulcerations have been managed aggressively at the [facility] with  dressing changes and local wound care with a debriding ointment. The patient  is felt to be a candidate for femoral-popliteal bypass grafting so as to  treat these ulcerations which are felt to  be a combined arterial and venous  disease etiology. She is scheduled for right femoral-popliteal bypass on  January 07, 2005, and will be admitted to the hospital for that procedure to  be performed by Dr. Amedeo Plenty.   PAST MEDICAL HISTORY:  1.  Diabetes mellitus.  2.  Extracranial cerebrovascular occlusive disease.  3.  Coronary artery disease status post myocardial infarction.  4.  History of CHF.  5.  Peripheral vascular occlusive disease.  6.  Aortoiliac occlusive disease.  7.  Venous stasis disease.  8.  Arthritis.  9.  Hypertension.  10. Hypothyroidism.  11. Hyperlipidemia.  12. Renal artery stenosis.   PAST SURGICAL HISTORY:  1.  Hysterectomy.  2.  Bilateral carotid endarterectomy.  3.  Coronary artery bypass grafting.  4.  Aortobifemoral bypass grafting with right renal artery bypass.   ALLERGIES:  No known drug allergies.   CURRENT MEDICATIONS:  1.  Lasix 40 mg daily.  2.  Synthroid 0.15 mg daily.  3.  Glipizide ER 2.5 mg daily.  4.  Crestor 20 mg daily.  5.  Cilostazol 100 mg b.i.d.  6.  TriCor 145 mg daily.  7.  Metformin 500 mg q.a.m.,  1500 mg q.p.m.  8.  Potassium chloride 20 mEq daily.  9.  Feosol 45 mg daily.  10. Vitamin B12 1500 mg b.i.d.   REVIEW OF SYMPTOMS:  See the history of the present illness for pertinent  positives and negatives. Otherwise, remarkable for decreased hearing and  shortness of breath with dyspnea on exertion.   SOCIAL HISTORY:  She is single with one child. She has a longstanding  tobacco use, currently one-fourth pack per day. Alcohol use:  None.   FAMILY HISTORY:  Mother deceased age 51 from diabetes and congestive heart  failure. Father deceased age 3 from a cerebrovascular accident.   PHYSICAL EXAMINATION:  VITAL SIGNS:  Blood pressure 120/60, heart rate 120,  respirations 16.  GENERAL APPEARANCE:  A 70 year old Caucasian female, no acute distress.  HEENT:  Normocephalic, atraumatic. Pupils equal, round, and reactive to   light. Extraocular movements intact. Sclerae are nonicteric. Oral mucosa is  pink. Teeth appear to be in good repair. No pharyngeal exudates, no  erythema, no lesions.  NECK:  Supple. No jugular venous distention. She has bilateral carotid  bruits. No lymphadenopathy.  PULMONARY:  Symmetrical on inspiration, clear breath sounds without wheeze,  rhonchi, or crackles.  CARDIAC:  Regular rate and rhythm, tachycardic. No murmurs, gallops, or  rubs.  ABDOMEN:  Soft, nontender, nondistended. Normal active bowel sounds.  Moderately obese. There are audible bruits transmitted throughout the  abdomen. No hepatosplenomegaly. She has multiple well-healed abdominal scars  from previous surgeries.  GENITOURINARY AND RECTAL:  Deferred.  EXTREMITIES:  She has pitting edema of both lower extremities from knees to  feet. She has bilateral venous stasis ulcerations, two on each lower  extremity in the anterior lateral shin regions. Feet are warm to touch.  PERIPHERAL PULSES:  She has weakly palpable femoral pulses bilaterally,  absent dorsalis pedis and posterior tibial bilaterally.  NEUROLOGIC:  Grossly nonfocal. Her gait is steady. She does walk with a  cane. Strength is grossly within normal limits but she has fair conditioning  overall.   ASSESSMENT:  Severe peripheral vascular occlusive disease and venous stasis  disease combining to form nonhealing bilateral lower extremities.   PLAN:  For right femoral-popliteal bypass so as an effort to heal these  lesions and lower her risk of amputation. Subsequent to this, she  tentatively will have the other side done and stenting of her right carotid  is also in the future planning.      John Giovanni, P.A.-C.      Dorothea Glassman, M.D.  Electronically Signed    WEG/MEDQ  D:  01/08/2005  T:  01/08/2005  Job:  [id number]

## 2010-07-24 NOTE — Op Note (Signed)
NAME:  [name] [name], [name] [name]              ACCOUNT NO.:  [id number]   MEDICAL RECORD NO.:  [id number]          PATIENT TYPE:  INP   LOCATION:  3310                         FACILITY:  [facility]   PHYSICIAN:  Dorothea Glassman, M.D.         DATE OF BIRTH:   DATE OF PROCEDURE:  01/12/2005  DATE OF DISCHARGE:                                 OPERATIVE REPORT   SURGEON:  Gordy Clement, MD   ASSISTANT:  Myra Gianotti, PA   ANESTHETIC:  General endotracheal.   PREOPERATIVE DIAGNOSES:  1.  Peripheral vascular disease.  2.  Nonhealing venous stasis ulcers, right leg.   POSTOPERATIVE DIAGNOSES:  1.  Peripheral vascular disease.  2.  Nonhealing venous stasis ulcers, right leg.  3.  Right femoral pseudoaneurysm.   PROCEDURE.:  1.  Repair of right femoral pseudoaneurysm.  2.  Right femoropopliteal bypass with 6-mm Gore-Tex graft.  3.  Debridement of venous status ulcers x2, right lower extremity.   CLINICAL NOTE:  [name] [name] is a 70 year old patient with diffuse  vascular disease.  She has previously undergone coronary artery bypass,  bilateral carotid endarterectomies and aortobifemoral bypass.  She now has  venous stasis ulceration and lower extremities bilaterally.  She was seen in  consultation at the [facility] by Dr. Zada Girt and referred for  further evaluation and management of her peripheral vascular disease.  Workup for this including arteriography revealed bilateral superficial  femoral artery occlusions.  She has had greater saphenous vein harvested  from both lower extremities for coronary bypass.  She is brought to the  operating room at this time for revascularization of the right leg with  planned right femoropopliteal bypass with Gore-Tex graft.   OPERATIVE PROCEDURE:  The patient was brought to the operating room in  stable condition.  Placed in supine position.  General endotracheal  anesthesia induced.  Right leg prepped and draped in a sterile fashion.  The  areas of ulceration in the right lower leg were covered with a sterile  impervious towel.   A longitudinal skin incision was made through the right groin.  Dissection  carried down through subcutaneous tissue.  Subcutaneous tissue dissected  down to the right limb of the aortofemoral graft.  Evaluation of this  revealed evidence of pseudoaneurysm at the anastomosis.  The right limb of  the aortofemoral graft was freed and encircled with a Vesseloop.  The  external iliac artery proximal to the anastomosis was freed and encircled  with a Vesseloop.  Distal dissection was carried down to expose the origin  of the superficial femoral artery, this was a chronically occluded,  encircled with a Vesseloop.  Branches of the profunda femoris artery were  freed and encircled with Vesseloops.   A curvilinear right medial popliteal incision was made.  Dissection carried  down through subcutaneous tissue.  Gastrocnemius incised.  Popliteal fossa  entered.  The right popliteal artery below the vein was identified.  This  was soft and relatively free of disease.  Freed and encircled with a  Vesseloop.  The origin of the tibioperoneal  trunk and anterior tibial artery  were also exposed and encircled with Vesseloops.   A subsartorial tunnel was then created between the heads of the  gastrocnemius muscle, tunneling up to the groin.  A 6-mm stretch thin-walled  Gore-Tex graft was placed through the tunnel.   The patient was administered 5000 units of heparin intravenously.  Adequate  circulation time permitted.  The right limb of the aortofemoral graft  controlled with a Fogarty clamp.  Femoral vessels controlled with clamps.  The right femoral anastomosis was taken down.  There was a pseudoaneurysm;  the common graft had lifted off of the right common femoral artery.  The the  artery was debrided back to more normal vessel.  The right superficial  femoral artery was chronically occluded.  The right limb  of aortofemoral  graft was divided transversely.  A 10-mm Dacron graft was then anastomosed  end-to-end to the divided right limb of the aortofemoral graft using running  6-0 Prolene suture.  This was then beveled and anastomosed end-to-side to  the right common femoral artery and right profunda femoris artery using  running 5-0 Prolene suture.  Following the right femoral anastomosis, the  hood of the graft was opened longitudinally with an 11 blade.  The 6-mm [facility] graft was beveled and anastomosed end-to-side on the hood of the right  limb of the aortofemoral graft using running 6-0 Prolene suture.  At  completion of this, the right femoral vessels were flushed through the graft  and the graft controlled with a fistula clamp.   The right popliteal artery was then controlled proximally and distally with  bulldog clamps.  A longitudinal arteriotomy was made in the distal right  popliteal artery.  The graft was then beveled and anastomosed end-to-side to  the right popliteal artery using running 6-0 Prolene suture.  The graft then  flushed.  Clamps were removed.  Excellent flow present.  Adequate hemostasis  obtained.  The patient was administered 30 mg of protamine intravenously.  Sponges and instrument counts were correct.   The right groin incision was irrigated with saline solution.  The  subcutaneous tissue was closed with running 2-0 Vicryl suture in 2 layers.  The skin closed with staples.   The popliteal incision was closed with a deep layer of interrupted 2-0  Vicryl suture for fascia.  Subcutaneous tissue was closed with running 2-0  Vicryl suture.  Staples applied to skin.   Sterile dressings were applied to the incisions.   Ulcers in the right leg was then exposed.  There was 2 distinct ulcers in  the right calf.  These were both debrided sharply, free of devitalized tissue.  There was good bleeding and the tissue bed was healthy.  These were  dressed with wet-to-dry  4 x 4's, Kerlix and Ace wrap.   The patient tolerated the procedure well.  Transferred to recovery room in  stable condition.      Dorothea Glassman, M.D.  Electronically Signed     PGH/MEDQ  D:  01/12/2005  T:  01/13/2005  Job:  [id number]

## 2010-07-24 NOTE — Op Note (Signed)
NAME:  [name] [name], [name] [name]              ACCOUNT NO.:  [id number]   MEDICAL RECORD NO.:  [id number]          PATIENT TYPE:  INP   LOCATION:  2807                         FACILITY:  [facility]   PHYSICIAN:  Ethelle Lyon, MD  DATE OF BIRTH:  03/06/1941   DATE OF PROCEDURE:  03/23/2006  DATE OF DISCHARGE:  03/23/2006                               OPERATIVE REPORT   PROCEDURE:  Selective bilateral carotid angiography, arch aortography.   INDICATIONS:  [name] [name] is a 70 year old lady with severe  atherosclerotic disease.  She is status post of bilateral carotid  endarterectomy with the right being done in 1994.  She presented with  ultrasound evidence of asymptomatic but severe right internal carotid  artery restenosis.  She underwent angiography by radiology in September  2006.  I confirmed at least 95% restenosis of the right internal carotid  artery site with occlusion of the external carotid.  She has been  considered for carotid stenting and presents today for repeat  angiography with an eye to right carotid stenting.   PROCEDURE TECHNIQUE:  Informed consent was obtained.  Under 1% lidocaine  local anesthesia, a 6-French sheath was placed in the right common  femoral artery using modified Seldinger technique.  Arch aortography was  performed using a pigtail catheter.  Selective angiography of left  carotid was performed using a VTEC catheter positioned in the proximal  left common carotid.  Angiography was performed of the entirety of the  carotid circulation using digital subtraction.   Catheter was then exchanged over wire for an H1 catheter.  This was  first advanced into the innominate artery over wire.  Angiography was  performed by hand injection.  Over a Wholey wire, the catheter was then  advanced into the right proximal right common carotid artery.  Gentle  injection was performed with only 2 mL of contrast.  This confirmed  occlusion of the right common carotid.   There are  robust left-to-right collaterals across the anterior cerebral  circulation.   IMPRESSION/RECOMMENDATIONS:  1. Patent left internal carotid artery at prior carotid endarterectomy      site.  There is approximately 50% focal restenosis.  2. Occluded right common carotid artery with excellent      collateralization of the anterior cerebral circulation from the      left.  We will manage the total occlusion conservatively.  I      discussed with Dr. Amedeo Plenty.      Ethelle Lyon, MD  Electronically Signed     WED/MEDQ  D:  03/23/2006  T:  03/24/2006  Job:  [id number]   cc:   Dorothea Glassman, M.D.  Pramod P. Leonie Man, MD

## 2010-07-24 NOTE — Discharge Summary (Signed)
NAME:  [name] [name], [name] [name]              ACCOUNT NO.:  [id number]   MEDICAL RECORD NO.:  [id number]          PATIENT TYPE:  INP   LOCATION:  5707                         FACILITY:  [facility]   PHYSICIAN:  [name], M.D.       DATE OF BIRTH:  March 09, 1940   DATE OF ADMISSION:  11/19/2004  DATE OF DISCHARGE:  11/23/2004                                 DISCHARGE SUMMARY   HISTORY:  The patient was admitted with recurrent and prolonged hypoglycemia  with symptoms of severe weakness and slurred speech.  The patient was  treated in the emergency room but because of persistent hypoglycemia she was  admitted, and she was managed as an inpatient.   Medications, allergies, past history, review of systems, see HPI.   PHYSICAL EXAMINATION:  Significant for generalized pallor.  Vital signs were  within normal limits.  Her systemic exam showed her to be alert,  cooperative.  She had bilateral neck bruits present and a short murmur at  the base.  Abdomen was unremarkable.  Extremities showed healing leg ulcers  on the lowest part with dark scabs.  Pedal pulses absent.   HOSPITAL COURSE:  The patient was given 10% dextrose to get her blood sugar  up, and this was given most of the day on the 15th.   Because of her neurologic symptoms, carotid Dopplers were done.  The next  day her hemoglobin dropped to 7 and GI consultation was done with Dr.  Vladimir Faster, who did panendoscopy on the 17th, which was unremarkable.  The  patient was clinically improved and her glipizide was restarted as her blood  sugars were higher.   SIGNIFICANT LABS:  Hemoglobin on admission 8.2, next day 7.0, on discharge  9.2, white count 10.7 on admission and subsequently 6.5.  Electrolytes  normal.  Albumin and protein levels low on the discharge date.  EKG was  showing old anteroseptal infarct and PVCs.  Carotid Doppler studies showed  greater than 80% ICA stenosis on the right and 60-80% on the left.   DISCHARGE DIAGNOSES:  1.   Hypoglycemia secondary to oral hypoglycemics with possible interaction      with Biaxin.  2.  Severe anemia of uncertain etiology, probably iron-deficiency and      nutritional as well as chronic disease.  3.  Transient ischemic attack-like symptoms in the setting of hypoglycemia,      with significant carotid vascular disease.  4.  Smoker.  5.  Diabetes with multiple complications and hyperlipidemia.  6.  Infected ulcers of legs, may be partially vascular in origin.   DISCHARGE RECOMMENDATIONS:  To continue the same medications as at home and  addition of Avelox and Flagyl for her ulcers and resume iron and baby  aspirin.  She will be seen in consultation with Dr. Estanislado Pandy for possible  carotid angiogram.           ______________________________  [name], M.D.     AK/MEDQ  D:  11/23/2004  T:  11/23/2004  Job:  [id number]

## 2010-07-24 NOTE — H&P (Signed)
NAME:  [name] [name], [name] [name]              ACCOUNT NO.:  [id number]   MEDICAL RECORD NO.:  [id number]          PATIENT TYPE:  INP   LOCATION:  5707                         FACILITY:  [facility]   PHYSICIAN:  [name], M.D.       DATE OF BIRTH:  [birth year]   DATE OF ADMISSION:  11/19/2004  DATE OF DISCHARGE:                                HISTORY & PHYSICAL   CHIEF COMPLAINT:  Low blood sugar.   HISTORY:  This is a 70 year old Caucasian, diabetic female who was taken to  the emergency room by ambulance because of difficulty with weakness.  The  patient had been seen in the office on Monday for a complete physical exam.  Because of her hyperglycemia after lunch, she was told to increase her  glipizide to b.i.d.  She, however, was starting to take this 2 tablets at  night.  On Wednesday morning, the patient woke up with a blood sugar of 55  but did not report this, and on Thursday morning her blood sugar was 35 at  home when she felt somewhat weak.  She treated this with a candy bar, and  blood sugars came up to over 60; however, around 6 o'clock in the evening,  she started feeling very weak and unable to lift her arms.  Her daughter was  unable to reach her and called an ambulance.  When the patient was found,  she was having some slight slurred speech and difficulty getting up and  moving around.  She was given a tube of instant glucose, and her blood sugar  was found to be holding, and with this she recovered on the way to the  emergency room.  Subsequently, the patient has done fine.  The patient did,  however, have another episode of low blood sugar in the emergency room at  10:30 or so with it dropping to 40 again.  Because of recurrent and  prolonged hypoglycemia, the patient was admitted.  The patient was not given  any further oral hypoglycemics.   The patient says that she has been able to keep her sodium intake normal  without any nausea or vomiting or diarrhea, and it is not clear  why she had  recurrent hypoglycemia.   MEDICATIONS:  The patient has been taking Biaxin since Monday because of  infected leg ulcers.  She also takes Glucophage, Avandia, Glucotrol XL,  Crestor, TriCor, Synthroid, verapamil, Lasix, Pletal.   ALLERGIES:  None.   PAST MEDICAL HISTORY:  1.  Hysterectomy.  2.  Coronary artery bypass surgery.  3.  Bilateral carotid endarterectomy.  4.  Aortofemoral bypass.   PERSONAL HISTORY:  She is a one-pack-a-day smoker.   REVIEW OF SYSTEMS:  She has had diabetes since 1996, recently under better  control after weight loss using __________ .  This was stopped because of  nausea.  The patient had decreased vision in the left sight but no  adenopathy.  She has hypothyroidism, hypertension.  Blood pressure low-  normal earlier this week, and Diovan was stopped.  She also complains of  some epigastric burning sensation which  is now better. She has mild anemia  with hemoglobin as low as 7.9 and received intravenous iron on Tuesday as an  outpatient.  She is not taking any nonsteroidal drugs right now.  She has  mild chronic cough.  She has mild chronic numbness in her toes.  She has  mixed hyperlipidemia controlled with two drugs, although HDL is still low.   PHYSICAL EXAMINATION:  GENERAL:  The patient is somewhat pale looking, but  she is alert and cooperative.  VITAL SIGNS:  Normal as assessed by nurses.  ENT:  Exam grossly normal.  No mucous membrane dryness.  NECK:  She has bilateral bruits present. Thyroid is not palpable.  HEART:  Murmur at the base present.  LUNGS: Clear.  ABDOMEN:  No tenderness or mass.  EXTREMITIES:  No edema.  She has healing leg ulcers on lower legs with dark  black scabs present.  Pedal pulses are absent.   ASSESSMENT:  1.  The patient has had prolonged hypoglycemia, apparently with Trialodine,      not clear if this is troubled by her taking Biaxin.  2.  She has bilateral carotid bruits.  Because of her neurological  symptoms      with the hypoglycemia, will need to assess this.  3.  She has multiple other medical problems including iron-deficiency anemia      which is being treated and evaluated.   PLAN:  Start 10% dextrose to avoid further hypoglycemia and check carotid  Doppler studies.  Will repeat her CBC tomorrow morning as hemoglobin is  still low at 8.2.           ______________________________  [name], M.D.     AK/MEDQ  D:  11/20/2004  T:  11/20/2004  Job:  [id number]

## 2010-12-24 ENCOUNTER — Other Ambulatory Visit: Payer: Self-pay

## 2011-04-13 IMAGING — CT CT ABD-PELV W/ CM
2 of 6 series · 13 of 32 positions shown, 18 images · IV contrast (water/omni  & [ID] 300)
Comparison: CT abdomen and pelvis 11/14/2009 and 11/27/2004.

CLINICAL DATA: Vomiting and diarrhea.  Blood in stool.

CT ABDOMEN AND PELVIS WITH CONTRAST
TECHNIQUE: Multidetector CT imaging of the abdomen and pelvis was
performed following the standard protocol during bolus
administration of intravenous contrast.
Contrast: 70 ml Cmnipaque-WYY.

[Series 2: routine abdomen · axial · 0.71mm/px · z∈[-450,-140]mm · 6 of 88 slices shown, 11 images]
[im 13/88  soft-tissue]
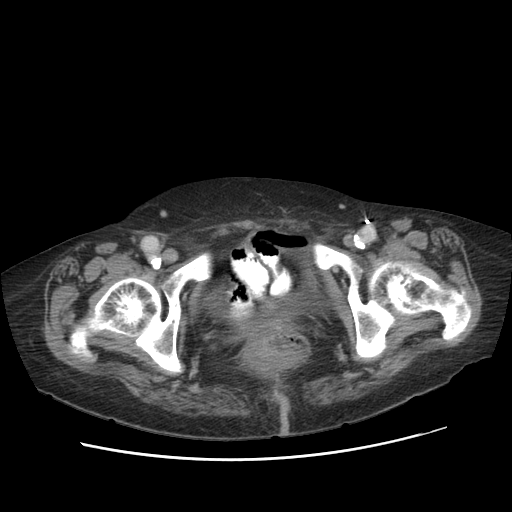
[im 13/88  bone]
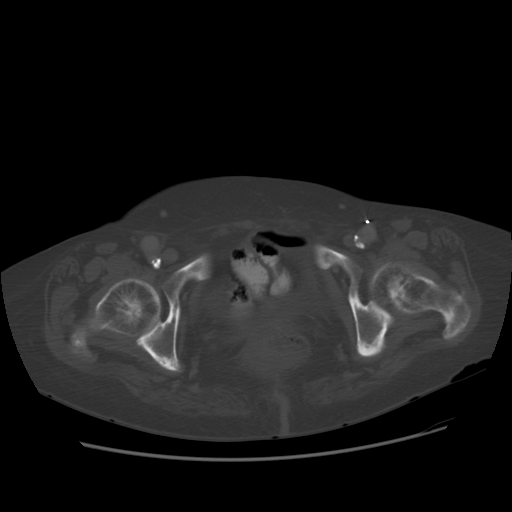
[im 25/88  soft-tissue]
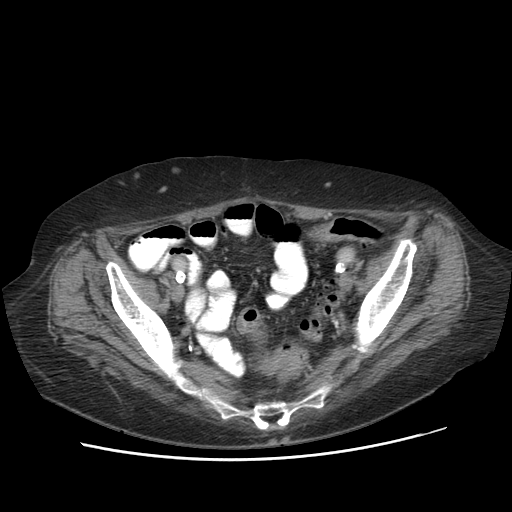
[im 38/88  soft-tissue]
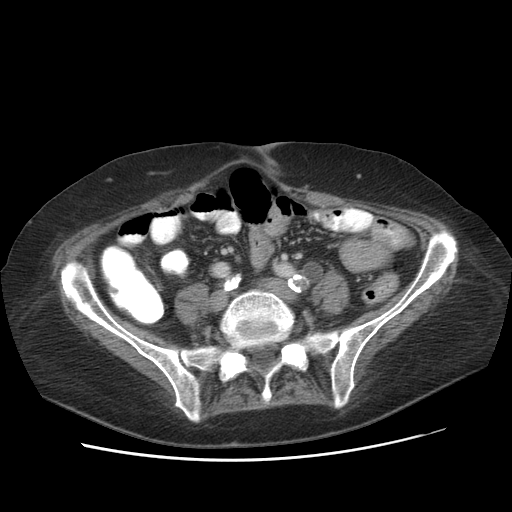
[im 38/88  lung]
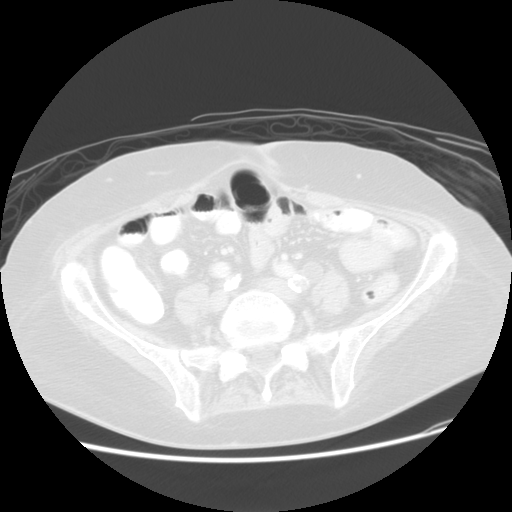
[im 50/88  soft-tissue]
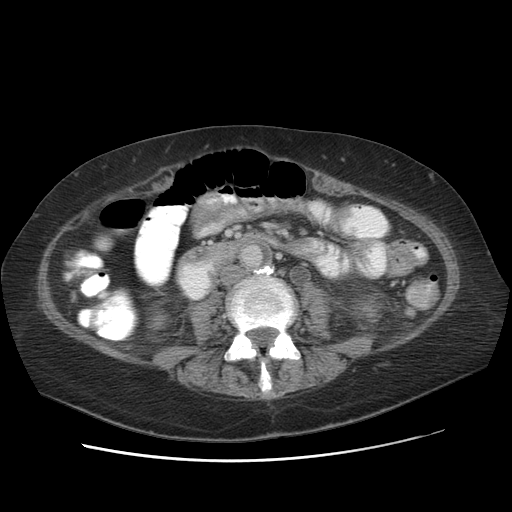
[im 50/88  lung]
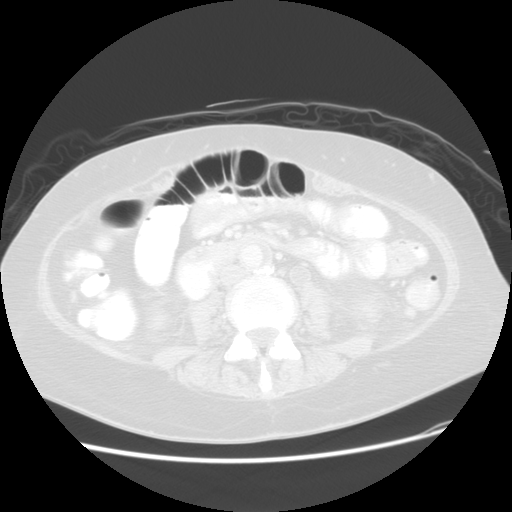
[im 63/88  soft-tissue]
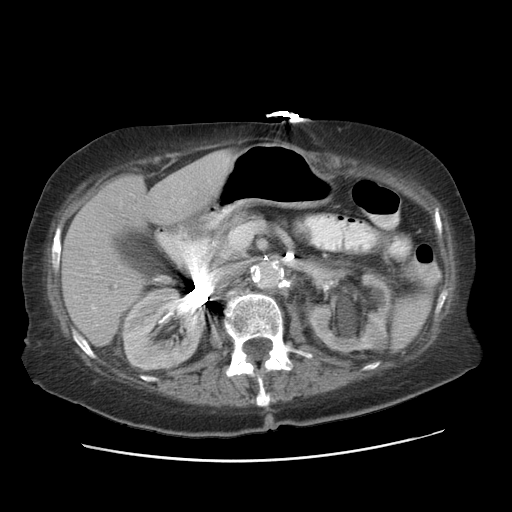
[im 63/88  lung]
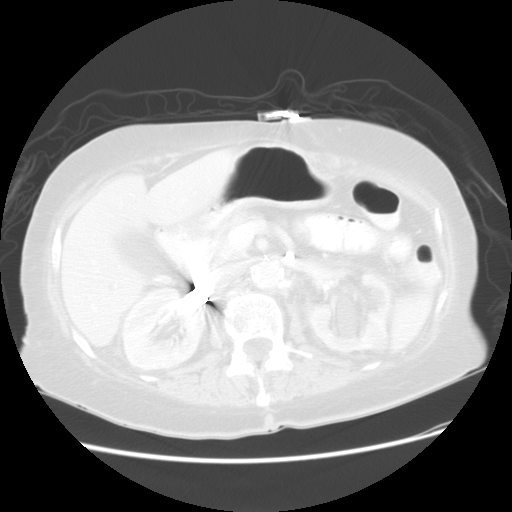
[im 75/88  soft-tissue]
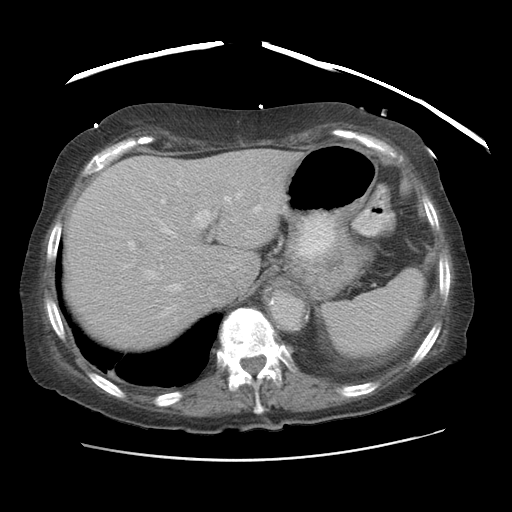
[im 75/88  lung]
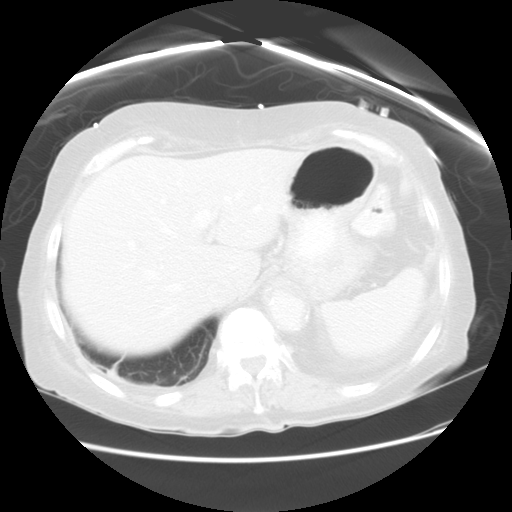

[Series 400: reformatted · sagittal · 0.87mm/px · 7 of 107 slices shown]
[im 12/107  soft-tissue]
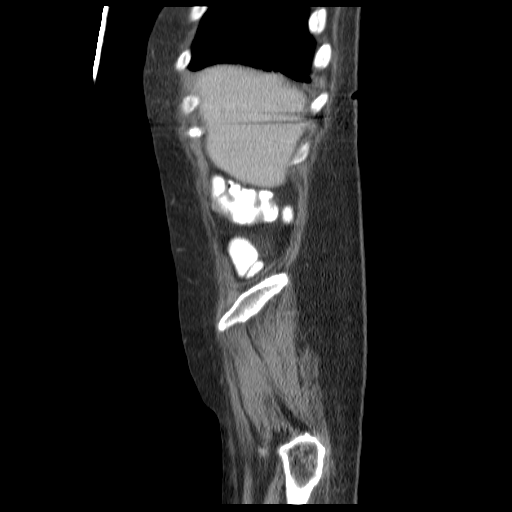
[im 24/107  soft-tissue]
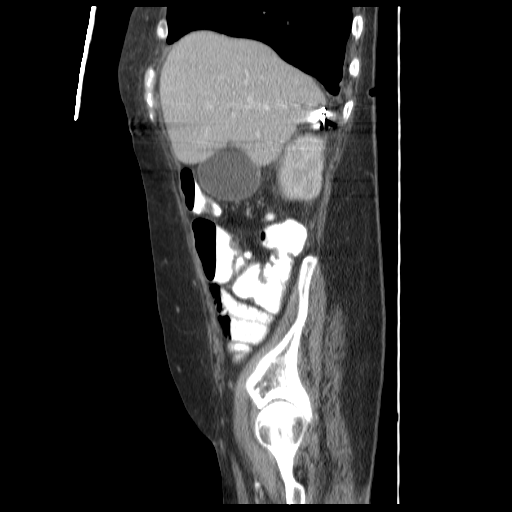
[im 36/107  soft-tissue]
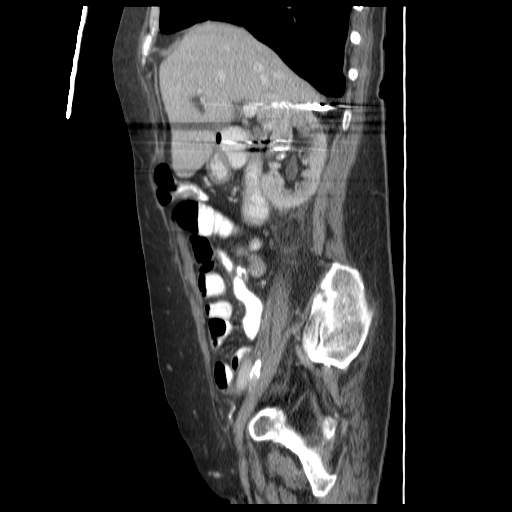
[im 48/107  soft-tissue]
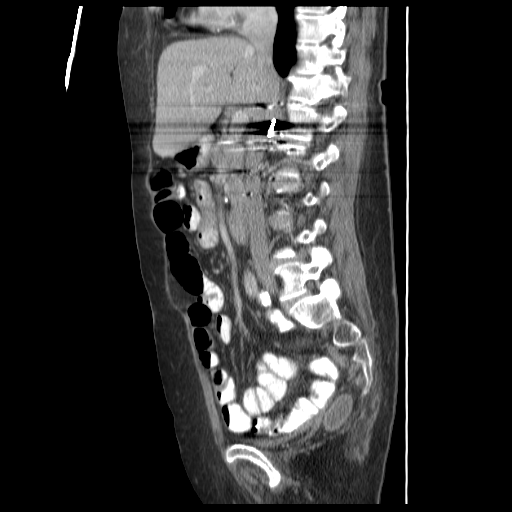
[im 59/107  soft-tissue]
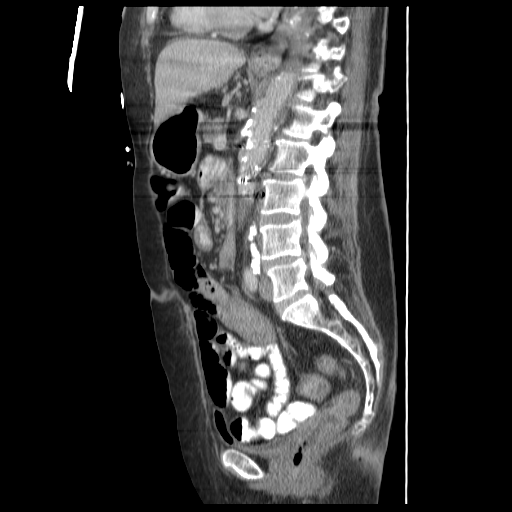
[im 71/107  soft-tissue]
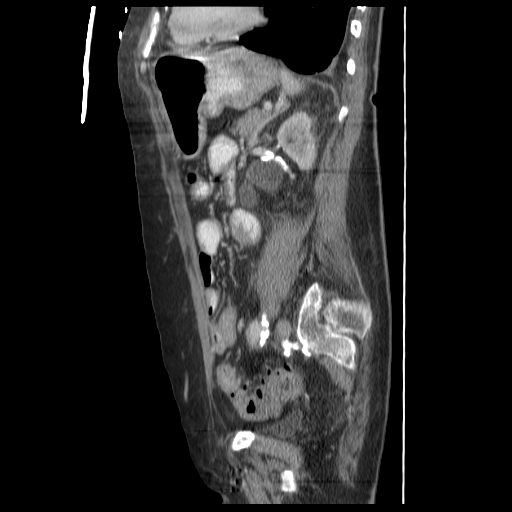
[im 83/107  soft-tissue]
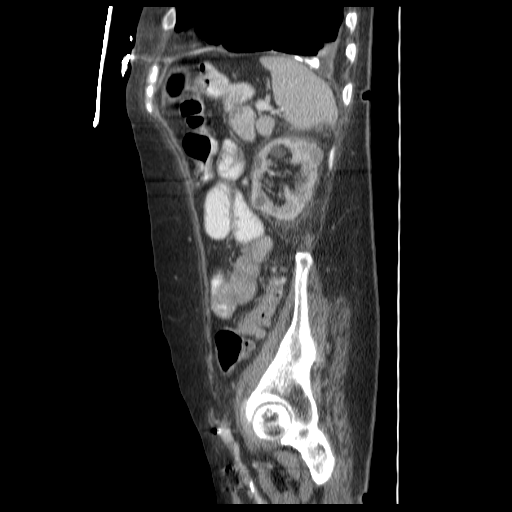

[13 of 32 positions shown; findings below may reference images not displayed]

FINDINGS: Small, chronic left pleural effusion and pleural
calcification are again seen.  Ground-glass nodule in the right
lower lobe described on the report of prior study is not visible on
this examination.  The portion of lung containing the nodule is not
included on the study.  No right pleural effusion or pericardial
effusion. Small hiatal hernia. Small bowel is unremarkable.

A few small stones are seen layering dependently within the
gallbladder.  No pericholecystic fluid or wall thickening.  No
focal liver lesion with mild biliary ductal prominence noted.
Surgical clips are seen in the region of the right adrenal gland
and the gland is not visualized.  Left adrenal gland, spleen and
pancreas are unremarkable.  There is unchanged dilatation of the
left ureter.  Bilateral renal cysts are noted including a 1.0 x
cm lesion in the mid pole of the left kidney which does not have
the appearance of a simple cyst.

The patient is status post hysterectomy.  Diverticular disease of
the colon is noted without diverticulitis.  The colon is otherwise
unremarkable. Mild laxity of the ventral abdominal wall is again
noted.  Bones again demonstrate T12 compression fracture,
unchanged.
IMPRESSION: 1.  No acute finding.
2.  Small, chronic left pleural effusion and pleural calcification.
3.  Small hiatal hernia.
4.  Chronic left hydroureteronephrosis.
5.  Gallstones without evidence of cholecystitis.
6.  Diverticulosis without diverticulitis.
7.  Extensive bilateral cystic change in the kidneys.  As on the
prior examination, a 1.6 cm lesion in the mid pole left kidney does
not have the typical appearance of a simple cyst and cannot be
fully characterized.

## 2011-05-24 DIAGNOSIS — D649 Anemia, unspecified: Secondary | ICD-10-CM | POA: Diagnosis not present

## 2011-05-24 DIAGNOSIS — E039 Hypothyroidism, unspecified: Secondary | ICD-10-CM | POA: Diagnosis not present

## 2011-05-24 DIAGNOSIS — E559 Vitamin D deficiency, unspecified: Secondary | ICD-10-CM | POA: Diagnosis not present

## 2011-05-24 DIAGNOSIS — N390 Urinary tract infection, site not specified: Secondary | ICD-10-CM | POA: Diagnosis not present

## 2011-06-02 DIAGNOSIS — I739 Peripheral vascular disease, unspecified: Secondary | ICD-10-CM | POA: Diagnosis not present

## 2011-06-02 DIAGNOSIS — I2510 Atherosclerotic heart disease of native coronary artery without angina pectoris: Secondary | ICD-10-CM | POA: Diagnosis not present

## 2011-06-02 DIAGNOSIS — E782 Mixed hyperlipidemia: Secondary | ICD-10-CM | POA: Diagnosis not present

## 2011-06-02 DIAGNOSIS — I6529 Occlusion and stenosis of unspecified carotid artery: Secondary | ICD-10-CM | POA: Diagnosis not present

## 2011-06-10 DIAGNOSIS — E039 Hypothyroidism, unspecified: Secondary | ICD-10-CM | POA: Diagnosis not present

## 2011-06-10 DIAGNOSIS — E559 Vitamin D deficiency, unspecified: Secondary | ICD-10-CM | POA: Diagnosis not present

## 2011-07-19 DIAGNOSIS — E039 Hypothyroidism, unspecified: Secondary | ICD-10-CM | POA: Diagnosis not present

## 2011-07-21 DIAGNOSIS — R809 Proteinuria, unspecified: Secondary | ICD-10-CM | POA: Diagnosis not present

## 2011-07-21 DIAGNOSIS — I10 Essential (primary) hypertension: Secondary | ICD-10-CM | POA: Diagnosis not present

## 2011-07-21 DIAGNOSIS — E039 Hypothyroidism, unspecified: Secondary | ICD-10-CM | POA: Diagnosis not present

## 2011-09-03 DIAGNOSIS — D649 Anemia, unspecified: Secondary | ICD-10-CM | POA: Diagnosis not present

## 2011-09-03 DIAGNOSIS — I10 Essential (primary) hypertension: Secondary | ICD-10-CM | POA: Diagnosis not present

## 2011-09-03 DIAGNOSIS — E039 Hypothyroidism, unspecified: Secondary | ICD-10-CM | POA: Diagnosis not present

## 2011-09-03 DIAGNOSIS — R5381 Other malaise: Secondary | ICD-10-CM | POA: Diagnosis not present

## 2011-11-03 DIAGNOSIS — D649 Anemia, unspecified: Secondary | ICD-10-CM | POA: Diagnosis not present

## 2011-11-03 DIAGNOSIS — E039 Hypothyroidism, unspecified: Secondary | ICD-10-CM | POA: Diagnosis not present

## 2011-11-05 ENCOUNTER — Other Ambulatory Visit: Payer: Self-pay | Admitting: Endocrinology

## 2011-11-05 DIAGNOSIS — E039 Hypothyroidism, unspecified: Secondary | ICD-10-CM | POA: Diagnosis not present

## 2011-11-05 DIAGNOSIS — I10 Essential (primary) hypertension: Secondary | ICD-10-CM | POA: Diagnosis not present

## 2011-11-05 DIAGNOSIS — D649 Anemia, unspecified: Secondary | ICD-10-CM | POA: Diagnosis not present

## 2011-11-09 ENCOUNTER — Other Ambulatory Visit: Payer: Self-pay | Admitting: Endocrinology

## 2011-11-24 DIAGNOSIS — I2510 Atherosclerotic heart disease of native coronary artery without angina pectoris: Secondary | ICD-10-CM | POA: Diagnosis not present

## 2011-11-24 DIAGNOSIS — Z951 Presence of aortocoronary bypass graft: Secondary | ICD-10-CM | POA: Diagnosis not present

## 2011-11-24 DIAGNOSIS — I739 Peripheral vascular disease, unspecified: Secondary | ICD-10-CM | POA: Diagnosis not present

## 2011-11-24 DIAGNOSIS — I10 Essential (primary) hypertension: Secondary | ICD-10-CM | POA: Diagnosis not present

## 2011-11-25 ENCOUNTER — Encounter (HOSPITAL_COMMUNITY)
Admission: RE | Admit: 2011-11-25 | Discharge: 2011-11-25 | Disposition: A | Discharge status: Home / Self Care | Payer: Medicare Other | Source: Ambulatory Visit | Attending: Endocrinology | Admitting: Endocrinology

## 2011-11-25 DIAGNOSIS — D649 Anemia, unspecified: Secondary | ICD-10-CM | POA: Diagnosis not present

## 2011-11-25 MED ORDER — FERUMOXYTOL INJECTION 510 MG/17 ML: 510.0000 mg | Freq: Once | INTRAVENOUS | Status: DC

## 2011-11-25 MED ORDER — FERUMOXYTOL INJECTION 510 MG/17 ML
INTRAVENOUS | Status: AC
  Administered 2011-11-25: 510 mg via INTRAVENOUS
  Filled 2011-11-25: qty 17

## 2011-11-25 MED ORDER — FERUMOXYTOL INJECTION 510 MG/17 ML
510.0000 mg | Freq: Once | INTRAVENOUS | Status: AC
  Administered 2011-11-25: 510 mg via INTRAVENOUS

## 2011-11-25 MED ORDER — SODIUM CHLORIDE 0.9 % IV SOLN
INTRAVENOUS | Status: DC
  Administered 2011-11-25: 10:00:00 via INTRAVENOUS

## 2011-12-01 DIAGNOSIS — I739 Peripheral vascular disease, unspecified: Secondary | ICD-10-CM | POA: Diagnosis not present

## 2011-12-01 DIAGNOSIS — E119 Type 2 diabetes mellitus without complications: Secondary | ICD-10-CM | POA: Diagnosis not present

## 2011-12-01 DIAGNOSIS — I2510 Atherosclerotic heart disease of native coronary artery without angina pectoris: Secondary | ICD-10-CM | POA: Diagnosis not present

## 2011-12-01 DIAGNOSIS — I6322 Cerebral infarction due to unspecified occlusion or stenosis of basilar arteries: Secondary | ICD-10-CM | POA: Diagnosis not present

## 2011-12-13 DIAGNOSIS — L259 Unspecified contact dermatitis, unspecified cause: Secondary | ICD-10-CM | POA: Diagnosis not present

## 2011-12-15 DIAGNOSIS — D649 Anemia, unspecified: Secondary | ICD-10-CM | POA: Diagnosis not present

## 2011-12-17 DIAGNOSIS — E559 Vitamin D deficiency, unspecified: Secondary | ICD-10-CM | POA: Diagnosis not present

## 2011-12-17 DIAGNOSIS — D649 Anemia, unspecified: Secondary | ICD-10-CM | POA: Diagnosis not present

## 2011-12-17 DIAGNOSIS — E039 Hypothyroidism, unspecified: Secondary | ICD-10-CM | POA: Diagnosis not present

## 2011-12-17 DIAGNOSIS — I10 Essential (primary) hypertension: Secondary | ICD-10-CM | POA: Diagnosis not present

## 2011-12-30 DIAGNOSIS — H40019 Open angle with borderline findings, low risk, unspecified eye: Secondary | ICD-10-CM | POA: Diagnosis not present

## 2011-12-30 DIAGNOSIS — E119 Type 2 diabetes mellitus without complications: Secondary | ICD-10-CM | POA: Diagnosis not present

## 2012-02-22 DIAGNOSIS — E039 Hypothyroidism, unspecified: Secondary | ICD-10-CM | POA: Diagnosis not present

## 2012-02-22 DIAGNOSIS — D649 Anemia, unspecified: Secondary | ICD-10-CM | POA: Diagnosis not present

## 2012-02-22 DIAGNOSIS — E559 Vitamin D deficiency, unspecified: Secondary | ICD-10-CM | POA: Diagnosis not present

## 2012-02-24 DIAGNOSIS — I10 Essential (primary) hypertension: Secondary | ICD-10-CM | POA: Diagnosis not present

## 2012-02-24 DIAGNOSIS — Z23 Encounter for immunization: Secondary | ICD-10-CM | POA: Diagnosis not present

## 2012-02-24 DIAGNOSIS — N390 Urinary tract infection, site not specified: Secondary | ICD-10-CM | POA: Diagnosis not present

## 2012-02-24 DIAGNOSIS — N289 Disorder of kidney and ureter, unspecified: Secondary | ICD-10-CM | POA: Diagnosis not present

## 2012-02-24 DIAGNOSIS — E785 Hyperlipidemia, unspecified: Secondary | ICD-10-CM | POA: Diagnosis not present

## 2012-02-24 DIAGNOSIS — D649 Anemia, unspecified: Secondary | ICD-10-CM | POA: Diagnosis not present

## 2012-02-24 DIAGNOSIS — E039 Hypothyroidism, unspecified: Secondary | ICD-10-CM | POA: Diagnosis not present

## 2012-02-24 DIAGNOSIS — R51 Headache: Secondary | ICD-10-CM | POA: Diagnosis not present

## 2012-03-08 ENCOUNTER — Emergency Department (HOSPITAL_COMMUNITY): Payer: Medicare Other

## 2012-03-08 ENCOUNTER — Encounter (HOSPITAL_COMMUNITY): Payer: Self-pay

## 2012-03-08 ENCOUNTER — Inpatient Hospital Stay (HOSPITAL_COMMUNITY)
Admission: EM | Admit: 2012-03-08 | Discharge: 2012-03-11 | DRG: 689 | Disposition: A | Discharge status: Home / Self Care | Payer: Medicare Other | Attending: Internal Medicine | Admitting: Internal Medicine

## 2012-03-08 DIAGNOSIS — N1330 Unspecified hydronephrosis: Secondary | ICD-10-CM | POA: Diagnosis present

## 2012-03-08 DIAGNOSIS — I10 Essential (primary) hypertension: Secondary | ICD-10-CM | POA: Diagnosis present

## 2012-03-08 DIAGNOSIS — J90 Pleural effusion, not elsewhere classified: Secondary | ICD-10-CM | POA: Diagnosis not present

## 2012-03-08 DIAGNOSIS — Z87891 Personal history of nicotine dependence: Secondary | ICD-10-CM

## 2012-03-08 DIAGNOSIS — Z79899 Other long term (current) drug therapy: Secondary | ICD-10-CM

## 2012-03-08 DIAGNOSIS — N281 Cyst of kidney, acquired: Secondary | ICD-10-CM | POA: Diagnosis not present

## 2012-03-08 DIAGNOSIS — R531 Weakness: Secondary | ICD-10-CM

## 2012-03-08 DIAGNOSIS — K8020 Calculus of gallbladder without cholecystitis without obstruction: Secondary | ICD-10-CM | POA: Diagnosis not present

## 2012-03-08 DIAGNOSIS — I714 Abdominal aortic aneurysm, without rupture, unspecified: Secondary | ICD-10-CM | POA: Diagnosis present

## 2012-03-08 DIAGNOSIS — Z951 Presence of aortocoronary bypass graft: Secondary | ICD-10-CM | POA: Diagnosis present

## 2012-03-08 DIAGNOSIS — D649 Anemia, unspecified: Secondary | ICD-10-CM | POA: Diagnosis present

## 2012-03-08 DIAGNOSIS — E119 Type 2 diabetes mellitus without complications: Secondary | ICD-10-CM | POA: Diagnosis not present

## 2012-03-08 DIAGNOSIS — E860 Dehydration: Secondary | ICD-10-CM | POA: Diagnosis not present

## 2012-03-08 DIAGNOSIS — R4182 Altered mental status, unspecified: Secondary | ICD-10-CM | POA: Diagnosis not present

## 2012-03-08 DIAGNOSIS — K5730 Diverticulosis of large intestine without perforation or abscess without bleeding: Secondary | ICD-10-CM | POA: Diagnosis present

## 2012-03-08 DIAGNOSIS — G9349 Other encephalopathy: Secondary | ICD-10-CM | POA: Diagnosis present

## 2012-03-08 DIAGNOSIS — R5381 Other malaise: Secondary | ICD-10-CM | POA: Diagnosis not present

## 2012-03-08 DIAGNOSIS — M159 Polyosteoarthritis, unspecified: Secondary | ICD-10-CM | POA: Diagnosis present

## 2012-03-08 DIAGNOSIS — I2510 Atherosclerotic heart disease of native coronary artery without angina pectoris: Secondary | ICD-10-CM | POA: Diagnosis present

## 2012-03-08 DIAGNOSIS — N179 Acute kidney failure, unspecified: Secondary | ICD-10-CM | POA: Diagnosis present

## 2012-03-08 DIAGNOSIS — N390 Urinary tract infection, site not specified: Secondary | ICD-10-CM | POA: Diagnosis not present

## 2012-03-08 DIAGNOSIS — G9340 Encephalopathy, unspecified: Secondary | ICD-10-CM | POA: Diagnosis present

## 2012-03-08 DIAGNOSIS — E039 Hypothyroidism, unspecified: Secondary | ICD-10-CM | POA: Diagnosis present

## 2012-03-08 DIAGNOSIS — R7989 Other specified abnormal findings of blood chemistry: Secondary | ICD-10-CM | POA: Diagnosis present

## 2012-03-08 DIAGNOSIS — R109 Unspecified abdominal pain: Secondary | ICD-10-CM | POA: Diagnosis present

## 2012-03-08 DIAGNOSIS — M129 Arthropathy, unspecified: Secondary | ICD-10-CM | POA: Diagnosis present

## 2012-03-08 DIAGNOSIS — B9689 Other specified bacterial agents as the cause of diseases classified elsewhere: Secondary | ICD-10-CM | POA: Diagnosis present

## 2012-03-08 DIAGNOSIS — Z7982 Long term (current) use of aspirin: Secondary | ICD-10-CM

## 2012-03-08 HISTORY — DX: Abdominal aortic aneurysm, without rupture, unspecified: I71.40

## 2012-03-08 HISTORY — DX: Hypothyroidism, unspecified: E03.9

## 2012-03-08 HISTORY — DX: Heart failure, unspecified: I50.9

## 2012-03-08 HISTORY — DX: Occlusion and stenosis of unspecified carotid artery: I65.29

## 2012-03-08 HISTORY — DX: Hyperlipidemia, unspecified: E78.5

## 2012-03-08 HISTORY — DX: Abdominal aortic aneurysm, without rupture: I71.4

## 2012-03-08 HISTORY — DX: Essential (primary) hypertension: I10

## 2012-03-08 LAB — URINE MICROSCOPIC-ADD ON

## 2012-03-08 LAB — CBC WITH DIFFERENTIAL/PLATELET
Basophils Relative: 0 % (ref 0–1)
HCT: 38.5 % (ref 36.0–46.0)
Hemoglobin: 12.4 g/dL (ref 12.0–15.0)
Lymphocytes Relative: 3 % — ABNORMAL LOW (ref 12–46)
Lymphs Abs: 0.4 10*3/uL — ABNORMAL LOW (ref 0.7–4.0)
MCHC: 32.2 g/dL (ref 30.0–36.0)
Monocytes Relative: 5 % (ref 3–12)
Neutro Abs: 12.1 10*3/uL — ABNORMAL HIGH (ref 1.7–7.7)
Neutrophils Relative %: 92 % — ABNORMAL HIGH (ref 43–77)
RBC: 4.15 MIL/uL (ref 3.87–5.11)
WBC: 13.1 10*3/uL — ABNORMAL HIGH (ref 4.0–10.5)

## 2012-03-08 LAB — COMPREHENSIVE METABOLIC PANEL
Albumin: 2.9 g/dL — ABNORMAL LOW (ref 3.5–5.2)
Alkaline Phosphatase: 151 U/L — ABNORMAL HIGH (ref 39–117)
BUN: 27 mg/dL — ABNORMAL HIGH (ref 6–23)
CO2: 21 mEq/L (ref 19–32)
Chloride: 100 mEq/L (ref 96–112)
GFR calc non Af Amer: 37 mL/min — ABNORMAL LOW (ref >90)
Potassium: 5 mEq/L (ref 3.5–5.1)
Total Bilirubin: 3.1 mg/dL — ABNORMAL HIGH (ref 0.3–1.2)

## 2012-03-08 LAB — URINALYSIS, ROUTINE W REFLEX MICROSCOPIC
Glucose, UA: NEGATIVE mg/dL
Protein, ur: 100 mg/dL — AB

## 2012-03-08 LAB — AMMONIA: Ammonia: <10 umol/L — ABNORMAL LOW (ref 11–60)

## 2012-03-08 MED ORDER — SODIUM CHLORIDE 0.9 % IV BOLUS (SEPSIS)
500.0000 mL | Freq: Once | INTRAVENOUS | Status: AC
  Administered 2012-03-08: 500 mL via INTRAVENOUS

## 2012-03-08 MED ORDER — CIPROFLOXACIN IN D5W 400 MG/200ML IV SOLN
400.0000 mg | Freq: Once | INTRAVENOUS | Status: AC
  Administered 2012-03-08: 400 mg via INTRAVENOUS
  Filled 2012-03-08: qty 200

## 2012-03-08 MED ORDER — ALBUTEROL SULFATE (5 MG/ML) 0.5% IN NEBU
5.0000 mg | INHALATION_SOLUTION | Freq: Once | RESPIRATORY_TRACT | Status: AC
  Administered 2012-03-08: 5 mg via RESPIRATORY_TRACT
  Filled 2012-03-08: qty 1

## 2012-03-08 MED ORDER — SODIUM CHLORIDE 0.9 % IV BOLUS (SEPSIS): 500.0000 mL | Freq: Once | INTRAVENOUS | Status: DC

## 2012-03-08 MED ORDER — CEFTRIAXONE SODIUM 1 G IJ SOLR
1.0000 g | Freq: Once | INTRAMUSCULAR | Status: AC
Start: 2012-03-08 — End: 2012-03-09
  Administered 2012-03-08: 1 g via INTRAVENOUS
  Filled 2012-03-08: qty 10

## 2012-03-08 NOTE — ED Provider Notes (Signed)
History     CSN: [id number]  Arrival date & time 03/08/12  1827   First MD Initiated Contact with Patient 03/08/12 1916      Chief Complaint  Patient presents with  . Weakness  . Emesis  . Diarrhea  . Altered Mental Status    (Consider location/radiation/quality/duration/timing/severity/associated sxs/prior treatment) The history is provided by the patient.  [name] [name] is a 72 y.o. female history of diabetes, CHF, CAD status post CABG here with episode of altered mental status. She stated she went to bed late and that she is very sleepy today. She did have a some nausea vomiting and diarrhea. This morning the daughter called her and noticed that she was confused. As he able to get a CBG that was 180. She also has intermittent headaches for the last 2 months but denies any blurred vision or photophobia. No fevers or chills or cough or urinary symptoms.    Past Medical History  Diagnosis Date  . Diabetes mellitus without complication   . CHF (congestive heart failure)   . Coronary artery disease     Past Surgical History  Procedure Date  . Cardiac surgery   . Abdominal hysterectomy   . Tumor removal   . Coronary stent placement   . Femoral artery repair     No family history on file.  History  Substance Use Topics  . Smoking status: Former Research scientist (life sciences)  . Smokeless tobacco: Never Used  . Alcohol Use: No    OB History    Grav Para Term Preterm Abortions TAB SAB Ect Mult Living                  Review of Systems  Gastrointestinal: Positive for vomiting and diarrhea.  Neurological: Positive for weakness.  Psychiatric/Behavioral: Positive for altered mental status.  All other systems reviewed and are negative.    Allergies  Novolog  Home Medications   Current Outpatient Rx  Name  Route  Sig  Dispense  Refill  . ASPIRIN 81 MG PO CHEW   Oral   Chew 81 mg by mouth every evening.          Marland Kitchen CALCIUM CARBONATE-VITAMIN D 500-200 MG-UNIT PO TABS   Oral  Take 1 tablet by mouth every morning.         Marland Kitchen CLOPIDOGREL BISULFATE 75 MG PO TABS   Oral   Take 75 mg by mouth every morning.          Marland Kitchen FERROUS SULFATE 325 (65 FE) MG PO TABS   Oral   Take 325 mg by mouth at bedtime.          Marland Kitchen HYDROCODONE-ACETAMINOPHEN 5-500 MG PO TABS   Oral   Take 1 tablet by mouth daily as needed. For pain         . INSULIN DETEMIR 100 UNIT/ML [place] SOLN   Subcutaneous   Inject 16 Units into the skin at bedtime.         Marland Kitchen LEVOTHYROXINE SODIUM 137 MCG PO TABS   Oral   Take 137 mcg by mouth daily before breakfast.         . LOSARTAN POTASSIUM 25 MG PO TABS   Oral   Take 25 mg by mouth every morning.          Marland Kitchen MAGNESIUM OXIDE 400 MG PO TABS   Oral   Take 800 mg by mouth at bedtime.          Marland Kitchen METFORMIN  HCL 500 MG PO TABS   Oral   Take 1,000 mg by mouth daily with breakfast.          . METOPROLOL SUCCINATE ER 25 MG PO TB24   Oral   Take 25 mg by mouth every morning.          Marland Kitchen ROSUVASTATIN CALCIUM 20 MG PO TABS   Oral   Take 20 mg by mouth at bedtime.         Marland Kitchen VITAMIN B-12 1000 MCG PO TABS   Oral   Take 1,000 mcg by mouth every morning.         Marland Kitchen VITAMIN C 500 MG PO TABS   Oral   Take 500 mg by mouth every morning.         Marland Kitchen VITAMIN D (ERGOCALCIFEROL) 50000 UNITS PO CAPS   Oral   Take 50,000 Units by mouth every 7 (seven) days. On thursdays           BP 111/95  Pulse 89  Temp 97.9 F (36.6 C) (Oral)  Resp 18  SpO2 99%  Physical Exam  Nursing note and vitals reviewed. Constitutional: She is oriented to person, place, and time. She appears well-developed and well-nourished.  HENT:  Head: Normocephalic.       MM slightly dry   Eyes: Conjunctivae normal are normal. Pupils are equal, round, and reactive to light.  Neck: Normal range of motion. Neck supple.  Cardiovascular: Normal rate, regular rhythm and normal heart sounds.   Pulmonary/Chest: Effort normal and breath sounds normal. No respiratory  distress. She has no wheezes. She has no rales.  Abdominal: Soft. Bowel sounds are normal. She exhibits no distension. There is no tenderness. There is no guarding.  Musculoskeletal: Normal range of motion. She exhibits no edema.  Neurological: She is alert and oriented to person, place, and time. No cranial nerve deficit.       Strength and sensation intact throughout   Skin: Skin is warm and dry.  Psychiatric: She has a normal mood and affect. Her behavior is normal. Judgment and thought content normal.    ED Course  Procedures (including critical care time)  Labs Reviewed  CBC WITH DIFFERENTIAL - Abnormal; Notable for the following:    WBC 13.1 (*)     Neutrophils Relative 92 (*)     Neutro Abs 12.1 (*)     Lymphocytes Relative 3 (*)     Lymphs Abs 0.4 (*)     All other components within normal limits  COMPREHENSIVE METABOLIC PANEL - Abnormal; Notable for the following:    Sodium 134 (*)     Glucose, Bld 183 (*)     BUN 27 (*)     Creatinine, Ser 1.39 (*)     Albumin 2.9 (*)     AST 473 (*)     ALT 341 (*)     Alkaline Phosphatase 151 (*)     Total Bilirubin 3.1 (*)     GFR calc non Af Amer 37 (*)     GFR calc Af Amer 43 (*)     All other components within normal limits  URINALYSIS, ROUTINE W REFLEX MICROSCOPIC - Abnormal; Notable for the following:    Color, Urine ORANGE (*)  BIOCHEMICALS MAY BE AFFECTED BY COLOR   APPearance TURBID (*)     Hgb urine dipstick SMALL (*)     Bilirubin Urine LARGE (*)     Ketones, ur TRACE (*)  Protein, ur 100 (*)     Leukocytes, UA LARGE (*)     All other components within normal limits  URINE MICROSCOPIC-ADD ON - Abnormal; Notable for the following:    Squamous Epithelial / LPF MANY (*)     Bacteria, UA MANY (*)     All other components within normal limits  AMMONIA - Abnormal; Notable for the following:    Ammonia <10 (*)     All other components within normal limits  URINE CULTURE   Dg Chest 2 View  03/08/2012  *RADIOLOGY  REPORT*  Clinical Data: Weakness, emesis, diarrhea, altered mental status  CHEST - 2 VIEW  Comparison: Prior chest x-ray 01/12/2010  Findings: Interval development of a small left pleural effusion. The lungs are otherwise clear.  No overt pulmonary edema or focal airspace consolidation.  Status post median sternotomy with evidence of multivessel CABG including LIMA bypass.  Aortic atherosclerosis.  No acute osseous abnormality.  IMPRESSION:  1.  Interval development of a small left pleural effusion since the prior chest x-ray on 01/12/2010. 2.  Otherwise, no acute cardiopulmonary disease.   Original Report Authenticated By: Jacqulynn Cadet, M.D.    Ct Head Wo Contrast  03/08/2012  *RADIOLOGY REPORT*  Clinical Data: Altered mental status, weakness  CT HEAD WITHOUT CONTRAST  Technique:  Contiguous axial images were obtained from the base of the skull through the vertex without contrast.  Comparison: Prior CT head 01/11/2010  Findings: No noncontrast CT evidence of acute intracranial hemorrhage, acute infarction, mass lesion, mass effect, hydrocephalus or midline shift.  Similar degree of global cerebral volume loss commensurate with age.  Interval progression of diffuse bilateral periventricular, subcortical and deep white matter hypoattenuation.  The pattern is nonspecific but most consistent with the sequela of longstanding microvascular ischemia.  Stable mild ventriculomegaly likely secondary to central atrophy.  Foci of hypoattenuation in the left caudate head similar to prior and favored to represent remote lacunar infarcts versus dilated perivascular spaces.  Diffuse intracranial atherosclerotic calcification of the bilateral cavernous carotid arteries and vertebral arteries in the foramen magnum.  Paranasal sinuses and mastoid air cells are well-aerated.  Globes are intact.  No focal calvarial abnormality.  IMPRESSION:  1.  No acute intracranial abnormality.  2.  Interval progression of bilateral  periventricular, subcortical and deep white matter hypoattenuation compared to 01/11/2010.  The pattern is nonspecific but most consistent with the sequela of longstanding microvascular ischemia as can be seen in patients with diabetes and / or hypertension.   Original Report Authenticated By: Jacqulynn Cadet, M.D.      No diagnosis found.   Date: 03/08/2012  Rate: 86  Rhythm: normal sinus rhythm  QRS Axis: normal  Intervals: normal  ST/T Wave abnormalities: nonspecific ST changes  Conduction Disutrbances:none  Narrative Interpretation:   Old EKG Reviewed: none available    MDM  IRYS MEINER is a 72 y.o. female here with AMS. Likely dehydration from viral syndrome. But will need to consider sepsis vs TIA. Will check labs, UA, CXR, CT head, give IVF and reassess.   11:27 PM Patient still weak. Labs showed WBC 13. UA + leuks and + WBC. She told me she is currently on cipro and she gets recurrent UTIs. I ordered ceftriaxone. She also has Cr 1.4, baseline 1.0. She also has elevated LFTs but abdomen soft and nontender. I ordered CT ab/pel and called Dr. Cathren Laine to admit the patient for dehydration and UTI. He will f/u the CT ab/pel.  Wandra Arthurs, MD 03/08/12 2330

## 2012-03-08 NOTE — ED Notes (Addendum)
Patient's daughter reports that she tried to call her mother today and could not get an answer, so she went to check on her and found the patient with confusion and in the bed c/o feeling weak. Patient states she has been having N/V/D. Patient also c/o getting headaches daily for more than 2 months. Patient denies blurred vision, photophobia, or sound that intensifies headache. Pateint also c/o low back pain and states she was given a pill by her doctor x 5 days.for her urine.

## 2012-03-08 NOTE — H&P (Addendum)
[facility] AD[facility] NOTE  History and Physical  [name] [id number] DOB: 09/16/1940 DOA: 03/08/2012   PCP: [name], MD   Chief Complaint: weakness and altered mental status  HPI: [name] [name] is a 72 y.o. female history of diabetes, CHF, CAD status post CABG here with episode of altered mental status. Patient was apparently well up until last night when she threw up once. The vomitus contained food particles that she had eaten. Patient woke up in the morning but was unable to get out of bed. According to her daughter went she was not able to hold a meaningful conversation. Patient was still lying in the bed when the doctor each at home at 5:30 in the evening and thought that it was still [place] morning. Daughter brought the patient to ER where she was found to have urinary tract infection.  Patient also complained of abdominal pain which started this morning, mild to moderate, present in the middle of her belly, nonradiating, no exacerbating factors, no relieving factors. Last BM was about 3 days ago. Patient states that she has not passed any gas since last 3 days. Patient has had history of hysterectomy.  No chest pain, shortness of breath, headaches, swelling, orthopnea, PND.    Review of Systems: Gastrointestinal: Positive for vomiting and diarrhea.  Neurological: Positive for weakness.  Psychiatric/Behavioral: Positive for altered mental status.  All other systems reviewed and are negative     Past Medical History  Diagnosis Date  . Diabetes mellitus without complication   . CHF (congestive heart failure)   . Coronary artery disease     Past Surgical History  Procedure Date  . Cardiac surgery   . Abdominal hysterectomy   . Tumor removal   . Coronary stent placement   . Femoral artery repair     Social History:  reports that she has quit smoking. She has never used smokeless tobacco. She reports that she does not drink alcohol or use illicit  drugs.  Allergies  Allergen Reactions  . Novolog (Insulin Aspart) Rash    No family history on file.   Prior to Admission medications   Medication Sig Start Date End Date Taking? Authorizing Provider  aspirin 81 MG chewable tablet Chew 81 mg by mouth every evening.    Yes Historical Provider, MD  calcium-vitamin D (OSCAL WITH D) 500-200 MG-UNIT per tablet Take 1 tablet by mouth every morning.   Yes Historical Provider, MD  clopidogrel (PLAVIX) 75 MG tablet Take 75 mg by mouth every morning.    Yes Historical Provider, MD  ferrous sulfate 325 (65 FE) MG tablet Take 325 mg by mouth at bedtime.    Yes Historical Provider, MD  HYDROcodone-acetaminophen (VICODIN) 5-500 MG per tablet Take 1 tablet by mouth daily as needed. For pain   Yes Historical Provider, MD  insulin detemir (LEVEMIR) 100 UNIT/ML injection Inject 16 Units into the skin at bedtime.   Yes Historical Provider, MD  levothyroxine (SYNTHROID, LEVOTHROID) 137 MCG tablet Take 137 mcg by mouth daily before breakfast.   Yes Historical Provider, MD  losartan (COZAAR) 25 MG tablet Take 25 mg by mouth every morning.    Yes Historical Provider, MD  magnesium oxide (MAG-OX) 400 MG tablet Take 800 mg by mouth at bedtime.    Yes Historical Provider, MD  metFORMIN (GLUCOPHAGE) 500 MG tablet Take 1,000 mg by mouth daily with breakfast.    Yes Historical Provider, MD  metoprolol succinate (TOPROL-XL) 25 MG 24 hr tablet Take 25  mg by mouth every morning.    Yes Historical Provider, MD  rosuvastatin (CRESTOR) 20 MG tablet Take 20 mg by mouth at bedtime.   Yes Historical Provider, MD  vitamin B-12 (CYANOCOBALAMIN) 1000 MCG tablet Take 1,000 mcg by mouth every morning.   Yes Historical Provider, MD  vitamin C (ASCORBIC ACID) 500 MG tablet Take 500 mg by mouth every morning.   Yes Historical Provider, MD  Vitamin D, Ergocalciferol, (DRISDOL) 50000 UNITS CAPS Take 50,000 Units by mouth every 7 (seven) days. 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  ENT: slightly dry mucous membrane  Neck: supple, no lymphadenopathy  Cardiovascular: normal rate, regular rhythm S1, S2 normal  Respiratory: clear to auscultation bilaterally with no rhonchi, wheezing, crackles  Abdomen: soft, non tender, positive bowel sounds, no oragnomegaly  Skin: no rash   Psychiatric: She has normal mood affect  Neurologic: Grossly normal, moving all four extremities  Wt Readings from Last 3 Encounters:  11/25/11 175 lb (79.379 kg)    Labs on Admission:  Basic Metabolic Panel:  Lab [id number] 2015  NA 134*  K 5.0  CL 100  CO2 21  GLUCOSE 183*  BUN 27*  CREATININE 1.39*  CALCIUM 9.5  MG --  PHOS --    Liver Function Tests:  Lab 03/08/12 2015  AST 473*  ALT 341*  ALKPHOS 151*  BILITOT 3.1*  PROT 7.0  ALBUMIN 2.9*    Lab 03/08/12 2221  AMMONIA <10*    CBC:  Lab 03/08/12 2015  WBC 13.1*  NEUTROABS 12.1*  HGB 12.4  HCT 38.5  MCV 92.8  PLT 249      Radiological Exams on Admission: Ct Abdomen Pelvis Wo Contrast  03/09/2012  *RADIOLOGY REPORT*  Clinical Data: Confusion; weakness.  Nausea, vomiting and diarrhea. Abdominal pain.  CT ABDOMEN AND PELVIS WITHOUT CONTRAST  Technique:  Multidetector CT imaging of the abdomen and pelvis was performed following the standard protocol without intravenous contrast.  Comparison: CT of the abdomen and pelvis performed 12/09/2009  Findings: The patient is status post median sternotomy.  There are changes of left-sided talc pleurodesis, with mild associated pleural thickening.  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  The liver and spleen are unremarkable in appearance.  The gallbladder remains distended, grossly unchanged from 2011. Scattered stones are seen layering dependently within the gallbladder.  The pancreas and adrenal glands are unremarkable.  There is significantly worsened chronic severe left-sided hydronephrosis, with marked left renal atrophy.  The proximal left ureter is markedly dilated, to the level of its passage across the left side of the aortobifemoral bypass graft.  This may reflect a focal stricture of the ureter as it passes over the stent graft.  Scattered hyperdense and hypodense cysts are seen throughout both kidneys, more prominent on the left.  Nonspecific perinephric stranding and fluid is seen bilaterally, worse on the left. No hydronephrosis is seen on the right side.  No obstructing ureteral stones  are seen.  Calcifications are noted along the branches of both renal arteries.  Postoperative change is noted overlying the right kidney, at the right renal hilum and about the mid abdominal aorta.  No free fluid is identified.  The small bowel is unremarkable in appearance.  The stomach is within normal limits.  No acute vascular abnormalities are seen.  Scattered calcification is noted along the abdominal aorta and its branches.  The patient's aortobifemoral bypass graft is grossly unremarkable, though not well assessed without contrast.  There is marked calcification involving the occluded native aortoiliac arterial branches.  The appendix is not definitely seen; there is no evidence for appendicitis.  Scattered diverticulosis is noted along the entirety of the colon, more prominent along the descending and sigmoid colon, without evidence of diverticulitis.  Contrast progresses to the level of the proximal transverse colon.  The bladder is mildly distended and grossly unremarkable.  The patient is status post hysterectomy; no suspicious adnexal  masses are seen.  No inguinal lymphadenopathy is seen.  There is relatively stable focal ectasia of the right common femoral artery near the anastomosis of the bypass graft and native vessel, measuring 1.9 cm in AP dimension.  Postoperative change is noted at the left inguinal region.  No acute osseous abnormalities are identified.  There is stable chronic compression deformity of vertebral body T12.  IMPRESSION:  1.  No definite acute abnormality seen to explain the patient's symptoms. 2.  Significantly worsened chronic severe left-sided hydronephrosis, with marked left renal atrophy.  There is marked dilatation of the proximal left ureter, to the level of its passage across the left side of the aortobifemoral bypass graft.  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 3.  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 4.  Aortobifemoral bypass graft is grossly unremarkable, though not well assessed without contrast. 5.  Scattered diverticulosis along the entirety of the colon, more prominent along the descending and sigmoid colon, without evidence of diverticulitis. 6.  Scattered hyperdense and hypodense cysts throughout both kidneys, more prominent on the left. 7.  Changes of left-sided talc pleurodesis, with mild associated pleural thickening. 8.  Cholelithiasis noted; gallbladder otherwise unremarkable.   Original Report Authenticated By: Santa Lighter, M.D.    Dg Chest 2 View  03/08/2012  *RADIOLOGY REPORT*  Clinical Data: Weakness, emesis, diarrhea, altered mental status  CHEST - 2 VIEW  Comparison: Prior chest x-ray 01/12/2010  Findings: Interval development of a small left pleural effusion. The lungs are otherwise clear.  No overt pulmonary edema or focal airspace consolidation.  Status post median sternotomy with evidence of multivessel CABG including LIMA bypass.  Aortic  atherosclerosis.  No acute osseous abnormality.  IMPRESSION:  1.  Interval development of a small left pleural effusion since the prior chest x-ray on 01/12/2010. 2.  Otherwise, no acute cardiopulmonary disease.   Original Report Authenticated By: Jacqulynn Cadet, M.D.    Ct Head Wo Contrast  03/08/2012  *RADIOLOGY REPORT*  Clinical Data: Altered mental status, weakness  CT HEAD WITHOUT CONTRAST  Technique:  Contiguous axial images were obtained from the base of the skull through the vertex without contrast.  Comparison: Prior CT head 01/11/2010  Findings: No noncontrast CT evidence of acute intracranial hemorrhage, acute infarction, mass lesion, mass effect, hydrocephalus or midline shift.  Similar degree of global cerebral volume loss commensurate with age.  Interval progression of diffuse bilateral periventricular, subcortical and deep white matter hypoattenuation.  The pattern is nonspecific but most consistent with the sequela of longstanding microvascular ischemia.  Stable mild ventriculomegaly likely secondary to central atrophy.  Foci of hypoattenuation in the left caudate head similar to prior and favored to represent remote lacunar infarcts versus dilated perivascular spaces.  Diffuse intracranial atherosclerotic calcification of the bilateral cavernous carotid arteries and vertebral arteries in the foramen magnum.  Paranasal sinuses and mastoid air cells are well-aerated.  Globes are intact.  No focal calvarial abnormality.  IMPRESSION:  1.  No acute intracranial abnormality.  2.  Interval progression of bilateral periventricular, subcortical and deep white matter hypoattenuation compared to 01/11/2010.  The pattern is nonspecific but most consistent with the sequela of longstanding microvascular ischemia as can be seen in patients with diabetes and / or hypertension.   Original Report Authenticated By: Jacqulynn Cadet, M.D.     EKG: normal sinus rhythm , HR~80 bpm   Principal Problem:   *Altered mental status Active Problems:  UTI (lower urinary tract infection)  Abdominal pain  CAD (coronary artery disease)  Diabetes mellitus, type II  Arthritis  HTN (hypertension)   Assessment/Plan  Altered mental status:  This has largely resolved at this time and seems to be most likely related to patient's urinary tract infection. Patient has had multiple episodes of urinary tract infection and in fact currently was on treatment. IV fluids and antibiotics at this time. -Obtain urine culture and sensitivities -Treat with Rocephin  UTI:  Patient presents with generalized weakness and lethargy. In the ED she was found to have UTI( UA is positive for large leucocytes with too numerous to count WBC's). Admit to tele bed Continue ceftriaxone F/U culture results Hydrate with IVF.   Abdominal pain: There is concern for obstruction given that patient has not had a BM since last 3 days and history of abdominal surgery. -CT abdomen was ordered by the ED physician which did not show obstruction  CAD s/p CABG:  Stable. She denies chest pain or SOB. Patient does complain of abdominal pain. Continue ASA, plavix, metoprolol, crestor and cozaar. -Cycle cardiac enzymes x3  DM:  Check AIC. Hold home dose of lantus.   SSI  Hypothyroidism:  Check TSH. Continue synthroid.   Acute kidney injury:  Baseline unclear. Last Cr was WNL in 2011. She presented with Cr of 1.39 likely pre- renal from dehydration.  Hydrate and recheck BMET in AM.   DVT: Lovenox    Code Status: Full Family Communication: Patient and family present at the bedside were updated on the plan of care Disposition Plan/Anticipated LOS: Anticipated discharge in 2- 3  Days.  Time spent: 60 minutes  Janell Quiet, MD  [facility] Team 5  If 7PM-7AM, please contact night-coverage at [email or web address], password [facility] 03/09/2012, 12:52 AM

## 2012-03-08 NOTE — ED Notes (Signed)
Notified Pt that urine is needed.

## 2012-03-09 ENCOUNTER — Other Ambulatory Visit: Payer: Self-pay | Admitting: Urology

## 2012-03-09 ENCOUNTER — Encounter (HOSPITAL_COMMUNITY): Payer: Self-pay | Admitting: Internal Medicine

## 2012-03-09 DIAGNOSIS — E119 Type 2 diabetes mellitus without complications: Secondary | ICD-10-CM | POA: Diagnosis not present

## 2012-03-09 DIAGNOSIS — I10 Essential (primary) hypertension: Secondary | ICD-10-CM | POA: Diagnosis present

## 2012-03-09 DIAGNOSIS — N179 Acute kidney failure, unspecified: Secondary | ICD-10-CM | POA: Diagnosis not present

## 2012-03-09 DIAGNOSIS — Z7982 Long term (current) use of aspirin: Secondary | ICD-10-CM | POA: Diagnosis not present

## 2012-03-09 DIAGNOSIS — G9340 Encephalopathy, unspecified: Secondary | ICD-10-CM | POA: Diagnosis present

## 2012-03-09 DIAGNOSIS — I714 Abdominal aortic aneurysm, without rupture: Secondary | ICD-10-CM | POA: Diagnosis present

## 2012-03-09 DIAGNOSIS — R7989 Other specified abnormal findings of blood chemistry: Secondary | ICD-10-CM | POA: Diagnosis not present

## 2012-03-09 DIAGNOSIS — I509 Heart failure, unspecified: Secondary | ICD-10-CM | POA: Diagnosis not present

## 2012-03-09 DIAGNOSIS — G9349 Other encephalopathy: Secondary | ICD-10-CM | POA: Diagnosis not present

## 2012-03-09 DIAGNOSIS — R4182 Altered mental status, unspecified: Secondary | ICD-10-CM | POA: Diagnosis present

## 2012-03-09 DIAGNOSIS — R5381 Other malaise: Secondary | ICD-10-CM | POA: Diagnosis not present

## 2012-03-09 DIAGNOSIS — D649 Anemia, unspecified: Secondary | ICD-10-CM | POA: Diagnosis present

## 2012-03-09 DIAGNOSIS — E039 Hypothyroidism, unspecified: Secondary | ICD-10-CM | POA: Diagnosis present

## 2012-03-09 DIAGNOSIS — E860 Dehydration: Secondary | ICD-10-CM | POA: Diagnosis not present

## 2012-03-09 DIAGNOSIS — Z79899 Other long term (current) drug therapy: Secondary | ICD-10-CM | POA: Diagnosis not present

## 2012-03-09 DIAGNOSIS — M129 Arthropathy, unspecified: Secondary | ICD-10-CM | POA: Diagnosis present

## 2012-03-09 DIAGNOSIS — I2510 Atherosclerotic heart disease of native coronary artery without angina pectoris: Secondary | ICD-10-CM | POA: Diagnosis present

## 2012-03-09 DIAGNOSIS — R109 Unspecified abdominal pain: Secondary | ICD-10-CM | POA: Diagnosis present

## 2012-03-09 DIAGNOSIS — Z951 Presence of aortocoronary bypass graft: Secondary | ICD-10-CM | POA: Diagnosis not present

## 2012-03-09 DIAGNOSIS — B9689 Other specified bacterial agents as the cause of diseases classified elsewhere: Secondary | ICD-10-CM | POA: Diagnosis not present

## 2012-03-09 DIAGNOSIS — N390 Urinary tract infection, site not specified: Secondary | ICD-10-CM | POA: Diagnosis not present

## 2012-03-09 DIAGNOSIS — J90 Pleural effusion, not elsewhere classified: Secondary | ICD-10-CM | POA: Diagnosis not present

## 2012-03-09 DIAGNOSIS — Z87891 Personal history of nicotine dependence: Secondary | ICD-10-CM | POA: Diagnosis not present

## 2012-03-09 DIAGNOSIS — K5730 Diverticulosis of large intestine without perforation or abscess without bleeding: Secondary | ICD-10-CM | POA: Diagnosis present

## 2012-03-09 DIAGNOSIS — N1330 Unspecified hydronephrosis: Secondary | ICD-10-CM | POA: Diagnosis present

## 2012-03-09 DIAGNOSIS — M159 Polyosteoarthritis, unspecified: Secondary | ICD-10-CM | POA: Diagnosis present

## 2012-03-09 LAB — COMPREHENSIVE METABOLIC PANEL
AST: 322 U/L — ABNORMAL HIGH (ref 0–37)
Albumin: 2.6 g/dL — ABNORMAL LOW (ref 3.5–5.2)
CO2: 22 mEq/L (ref 19–32)
Chloride: 97 mEq/L (ref 96–112)
Creatinine, Ser: 1.35 mg/dL — ABNORMAL HIGH (ref 0.50–1.10)
GFR calc Af Amer: 45 mL/min — ABNORMAL LOW (ref >90)
Potassium: 4.1 mEq/L (ref 3.5–5.1)

## 2012-03-09 LAB — CBC
HCT: 32.3 % — ABNORMAL LOW (ref 36.0–46.0)
MCV: 91 fL (ref 78.0–100.0)
Platelets: 206 10*3/uL (ref 150–400)
RBC: 3.55 MIL/uL — ABNORMAL LOW (ref 3.87–5.11)
RDW: 14.2 % (ref 11.5–15.5)
WBC: 8.5 10*3/uL (ref 4.0–10.5)

## 2012-03-09 LAB — TROPONIN I
Troponin I: <0.3 ng/mL (ref <0.30)
Troponin I: <0.3 ng/mL (ref <0.30)

## 2012-03-09 LAB — GLUCOSE, CAPILLARY: Glucose-Capillary: 219 mg/dL — ABNORMAL HIGH (ref 70–99)

## 2012-03-09 MED ORDER — ONDANSETRON HCL 4 MG PO TABS: 4.0000 mg | ORAL_TABLET | Freq: Four times a day (QID) | ORAL | Status: DC | PRN

## 2012-03-09 MED ORDER — METOPROLOL SUCCINATE ER 25 MG PO TB24
25.0000 mg | ORAL_TABLET | Freq: Every morning | ORAL | Status: DC
  Administered 2012-03-09 – 2012-03-11 (×3): 25 mg via ORAL
  Filled 2012-03-09 (×3): qty 1

## 2012-03-09 MED ORDER — ASPIRIN 81 MG PO CHEW
81.0000 mg | CHEWABLE_TABLET | Freq: Every evening | ORAL | Status: DC
  Administered 2012-03-09 – 2012-03-10 (×2): 81 mg via ORAL
  Filled 2012-03-09 (×3): qty 1

## 2012-03-09 MED ORDER — LEVOTHYROXINE SODIUM 137 MCG PO TABS
137.0000 ug | ORAL_TABLET | Freq: Every day | ORAL | Status: DC
  Administered 2012-03-09 – 2012-03-11 (×3): 137 ug via ORAL
  Filled 2012-03-09 (×4): qty 1

## 2012-03-09 MED ORDER — DOCUSATE SODIUM 100 MG PO CAPS
100.0000 mg | ORAL_CAPSULE | Freq: Two times a day (BID) | ORAL | Status: DC
  Administered 2012-03-09 – 2012-03-11 (×5): 100 mg via ORAL
  Filled 2012-03-09 (×6): qty 1

## 2012-03-09 MED ORDER — SENNA 8.6 MG PO TABS
1.0000 | ORAL_TABLET | Freq: Two times a day (BID) | ORAL | Status: DC
  Administered 2012-03-09 – 2012-03-11 (×5): 8.6 mg via ORAL
  Filled 2012-03-09 (×5): qty 1

## 2012-03-09 MED ORDER — INSULIN DETEMIR 100 UNIT/ML ~~LOC~~ SOLN
16.0000 [IU] | Freq: Every day | SUBCUTANEOUS | Status: DC
  Administered 2012-03-09 – 2012-03-10 (×2): 16 [IU] via SUBCUTANEOUS
  Filled 2012-03-09: qty 10

## 2012-03-09 MED ORDER — ONDANSETRON HCL 4 MG/2ML IJ SOLN: 4.0000 mg | Freq: Three times a day (TID) | INTRAMUSCULAR | Status: AC | PRN

## 2012-03-09 MED ORDER — MAGNESIUM OXIDE 400 (241.3 MG) MG PO TABS
800.0000 mg | ORAL_TABLET | Freq: Every day | ORAL | Status: DC
  Administered 2012-03-09 – 2012-03-10 (×2): 800 mg via ORAL
  Filled 2012-03-09 (×3): qty 2

## 2012-03-09 MED ORDER — LOSARTAN POTASSIUM 25 MG PO TABS
25.0000 mg | ORAL_TABLET | Freq: Every morning | ORAL | Status: DC
  Administered 2012-03-09 – 2012-03-11 (×3): 25 mg via ORAL
  Filled 2012-03-09 (×3): qty 1

## 2012-03-09 MED ORDER — INSULIN ASPART 100 UNIT/ML ~~LOC~~ SOLN: 0.0000 [IU] | Freq: Three times a day (TID) | SUBCUTANEOUS | Status: DC

## 2012-03-09 MED ORDER — ATORVASTATIN CALCIUM 80 MG PO TABS
80.0000 mg | ORAL_TABLET | Freq: Every day | ORAL | Status: DC
  Administered 2012-03-09: 80 mg via ORAL
  Filled 2012-03-09 (×2): qty 1

## 2012-03-09 MED ORDER — HYDROMORPHONE HCL PF 1 MG/ML IJ SOLN: 1.0000 mg | INTRAMUSCULAR | Status: AC | PRN

## 2012-03-09 MED ORDER — SODIUM CHLORIDE 0.9 % IV SOLN: INTRAVENOUS | Status: AC

## 2012-03-09 MED ORDER — CLOPIDOGREL BISULFATE 75 MG PO TABS
75.0000 mg | ORAL_TABLET | Freq: Every morning | ORAL | Status: DC
  Administered 2012-03-09 – 2012-03-11 (×3): 75 mg via ORAL
  Filled 2012-03-09 (×5): qty 1

## 2012-03-09 MED ORDER — DEXTROSE 5 % IV SOLN
1.0000 g | INTRAVENOUS | Status: DC
  Administered 2012-03-10 – 2012-03-11 (×2): 1 g via INTRAVENOUS
  Filled 2012-03-09 (×2): qty 10

## 2012-03-09 MED ORDER — ENOXAPARIN SODIUM 40 MG/0.4ML ~~LOC~~ SOLN
40.0000 mg | SUBCUTANEOUS | Status: DC
  Administered 2012-03-09 – 2012-03-11 (×3): 40 mg via SUBCUTANEOUS
  Filled 2012-03-09 (×3): qty 0.4

## 2012-03-09 MED ORDER — ONDANSETRON HCL 4 MG/2ML IJ SOLN: 4.0000 mg | Freq: Four times a day (QID) | INTRAMUSCULAR | Status: DC | PRN

## 2012-03-09 MED ORDER — HYDROCODONE-ACETAMINOPHEN 5-325 MG PO TABS
1.0000 | ORAL_TABLET | ORAL | Status: DC | PRN
  Administered 2012-03-09 – 2012-03-11 (×4): 2 via ORAL
  Filled 2012-03-09 (×4): qty 2

## 2012-03-09 MED ORDER — SODIUM CHLORIDE 0.9 % IV SOLN
INTRAVENOUS | Status: DC
  Administered 2012-03-09 – 2012-03-11 (×5): via INTRAVENOUS

## 2012-03-09 NOTE — Progress Notes (Signed)
   CARE MANAGEMENT NOTE 03/09/2012  Patient:  [name] [name], [name] [name]   Account Number:  [id number]  Date Initiated:  03/09/2012  Documentation initiated by:  Olga Coaster  Subjective/Objective Assessment:   ADMITTED WITH WEAKNESS, UTI     Action/Plan:   PCP: [name], MD  LIVES AT HOME ALONE; CM FOLLOWING FOR DCP; AWAITING PT/OT EVALS FOR DISPOSITION   Anticipated DC Date:  03/14/2012   Anticipated DC Plan:  [place] [facility]  CM consult          Status of service:  In process, will continue to follow Medicare Important Message given?  NA - LOS <3 / Initial given by admissions (If response is "NO", the following Medicare IM given date fields will be blank)  Per UR Regulation:  Reviewed for med. necessity/level of care/duration of stay  Comments:  03/09/2012- B Greogory Cornette RN,BSN,MHA

## 2012-03-09 NOTE — Progress Notes (Signed)
OT Cancellation Note  Patient Details Name: [name] [name] MRN: [id number] DOB: December 16, 1940   Cancelled Treatment:    Reason Eval/Treat Not Completed: Other (comment) (OT Screen- Pt presents with no needs.)  Laqueisha Catalina A OTR/L [phone] 03/09/2012, 11:25 AM

## 2012-03-09 NOTE — Evaluation (Signed)
Physical Therapy Evaluation Patient Details Name: [name] [name] MRN: [id number] DOB: January 16, 1941 Today's Date: 03/09/2012 Time: [id number] PT Time Calculation (min): 20 min  PT Assessment / Plan / Recommendation Clinical Impression  Pt. was admitted 03/08/12 with AMS, UTI, N/V/. Pt has h/oDM, CAD, CHF, CABG. Pt. lives alone, has family available to check on pt.ated ambulation in hallway w/ RW. Pt. has DME. Continue PT while in acute care.    PT Assessment  Patient needs continued PT services    Follow Up Recommendations  No PT follow up    Does the patient have the potential to tolerate intense rehabilitation      Barriers to Discharge        Equipment Recommendations  None recommended by PT    Recommendations for Other Services     Frequency Min 3X/week    Precautions / Restrictions Precautions Precautions: Fall   Pertinent Vitals/Pain Reports back hurts while walking. Noted mild dyspnea, on RA, sats would not pick up.      Mobility  Bed Mobility Bed Mobility: Supine to Sit Supine to Sit: 7: Independent Transfers Transfers: Sit to Stand;Stand to Sit Sit to Stand: 6: Modified independent (Device/Increase time);From bed;From toilet;With upper extremity assist Stand to Sit: 6: Modified independent (Device/Increase time);To chair/3-in-1;To toilet;With upper extremity assist Ambulation/Gait Ambulation/Gait Assistance: 5: Supervision Ambulation Distance (Feet): 300 Feet Assistive device: Rolling walker Ambulation/Gait Assistance Details: no difficulties noted. Gait Pattern: Step-through pattern;Trunk flexed Gait velocity: WFL    Shoulder Instructions     Exercises     PT Diagnosis: Difficulty walking;Acute pain  PT Problem List: Decreased strength;Decreased activity tolerance;Cardiopulmonary status limiting activity PT Treatment Interventions: DME instruction;Gait training;Functional mobility training;Therapeutic activities;Patient/family education   PT  Goals Acute Rehab PT Goals PT Goal Formulation: With patient Time For Goal Achievement: 03/16/12 Potential to Achieve Goals: Good Pt will Ambulate: >150 feet;with modified independence;with least restrictive assistive device PT Goal: Ambulate - Progress: Goal set today  Visit Information  Last PT Received On: 03/09/12 Assistance Needed: +1    Subjective Data  Subjective: I am going home Patient Stated Goal: same   Prior Dwight Lives With: Alone Available Help at Discharge: Family;Available PRN/intermittently Type of Home: House Home Access: Level entry Home Layout: One level Home Adaptive Equipment: Straight cane Prior Function Level of Independence: Independent with assistive device(s) Driving: Yes Communication Communication: No difficulties    Cognition  Overall Cognitive Status: Appears within functional limits for tasks assessed/performed Arousal/Alertness: Awake/alert Orientation Level: Appears intact for tasks assessed Behavior During Session: [facility] for tasks performed    Extremity/Trunk Assessment Right Lower Extremity Assessment RLE ROM/Strength/Tone: WFL for tasks assessed RLE Sensation: WFL - Light Touch Left Lower Extremity Assessment LLE ROM/Strength/Tone: WFL for tasks assessed LLE Sensation: WFL - Light Touch   Balance    End of Session PT - End of Session Activity Tolerance: Patient tolerated treatment well Patient left: in chair;with call bell/phone within reach Nurse Communication: Mobility status  GP     Claretha Cooper 03/09/2012, 10:19 AM  [phone]

## 2012-03-09 NOTE — ED Notes (Signed)
RN not able to take report at this time. To return call.

## 2012-03-09 NOTE — Progress Notes (Signed)
Inpatient Diabetes Program Recommendations  AACE/ADA: New Consensus Statement on Inpatient Glycemic Control (2013)  Target Ranges:  Prepandial:   less than 140 mg/dL      Peak postprandial:   less than 180 mg/dL (1-2 hours)      Critically ill patients:  140 - 180 mg/dL   Reason for Visit:   Inpatient Diabetes Program Recommendations Correction (SSI): Add Novolog SENSITIVE correction scale AC & HS while in hospital.   Note: Add Novolog correction scale along with the Levemir 16 units at HS.

## 2012-03-09 NOTE — ED Notes (Signed)
Admitting MD at bedside.

## 2012-03-09 NOTE — ED Notes (Signed)
Pt waiting bed.  dtr has left.  Number in chart.

## 2012-03-09 NOTE — Consult Note (Signed)
Urology Consult   Physician requesting consult: Rama  Reason for consult: Chronic left hydronephrosis  History of Present Illness: [name] [name] is a 72 y.o. cauc female with PMH significant for HTN, CAD, PVD, and DM who presented to the ED yesterday for eval of altered mental status.  Her daughter came to check on her at home when she did not answer the phone and found that the pt was confused and unable to get out of bed.  Pt states she vomited one time yesterday and this was accompanied by abdominal and back pain.  On presentation to the ED the pt was found to have a UTI and started on IV Cipro and Rocephin.  CT scan revealed progression of known chronic left sided hydronephrosis with marked left renal atrophy and no stones present.  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 Cr was elevated at 1.39.  After initiation of IVF and Abx her mentation improved and she is at her baseline.  She is currently resting comfortably in the chair and is without complaint.  Pt states that she has had chronic back pain for 3-5 years.  This is across her low back and is made worse with ambulation and prolonged standing.  She was told by her PCP that she had arthritis in her back.  She has constipation.  She denies difficult voiding and hematuria.     Past Medical History  Diagnosis Date  . Diabetes mellitus without complication   . CHF (congestive heart failure)   . Coronary artery disease   . AAA (abdominal aortic aneurysm) 03/09/2012  . PVD (peripheral vascular disease)   . Carotid artery stenosis   . Hypertension   . Hyperlipidemia   . Hypothyroidism     Past Surgical History  Procedure Date  . Cardiac surgery   . Abdominal hysterectomy   . Tumor removal   . Coronary stent placement   . Femoral artery repair   aorto-bifemoral BG Carotid stents  bilateral  Bilateral LE stents  Home meds:  Prior to Admission medications   Medication Sig Start Date End Date Taking? Authorizing Provider  aspirin 81 MG chewable tablet Chew 81 mg by mouth every evening.    Yes Historical Provider, MD  calcium-vitamin D (OSCAL WITH D) 500-200 MG-UNIT per tablet Take 1 tablet by mouth every morning.   Yes Historical Provider, MD  clopidogrel (PLAVIX) 75 MG tablet Take 75 mg by mouth every morning.    Yes Historical Provider, MD  ferrous sulfate 325 (65 FE) MG tablet Take 325 mg by mouth at bedtime.    Yes Historical Provider, MD  HYDROcodone-acetaminophen (VICODIN) 5-500 MG per tablet Take 1 tablet by mouth daily as needed. For pain   Yes Historical Provider, MD  insulin detemir (LEVEMIR) 100 UNIT/ML injection Inject 16 Units into the skin at bedtime.   Yes Historical Provider, MD  levothyroxine (SYNTHROID, LEVOTHROID) 137 MCG tablet Take 137 mcg by mouth daily before breakfast.   Yes Historical Provider, MD  losartan (COZAAR) 25 MG tablet Take 25 mg by mouth every morning.    Yes Historical Provider, MD  magnesium oxide (MAG-OX) 400 MG tablet Take 800 mg by mouth at bedtime.    Yes Historical Provider, MD  metFORMIN (GLUCOPHAGE) 500 MG tablet Take 1,000 mg by mouth daily with breakfast.    Yes Historical Provider, MD  metoprolol succinate (TOPROL-XL) 25 MG 24 hr tablet Take 25 mg by mouth every morning.    Yes Historical Provider, MD  rosuvastatin (CRESTOR) 20 MG tablet Take 20 mg by mouth at bedtime.   Yes Historical Provider, MD  vitamin B-12 (CYANOCOBALAMIN) 1000 MCG tablet Take 1,000 mcg by mouth every morning.   Yes Historical Provider, MD  vitamin C (ASCORBIC ACID) 500 MG tablet Take 500 mg by mouth every morning.   Yes Historical Provider, MD  Vitamin D, Ergocalciferol, (DRISDOL) 50000 UNITS CAPS Take 50,000 Units by mouth every 7 (seven) days. On thursdays   Yes Historical Provider, MD     Current Hospital Medications: Scheduled Meds:   . sodium  chloride   Intravenous STAT  . aspirin  81 mg Oral QPM  . atorvastatin  80 mg Oral q1800  . cefTRIAXone (ROCEPHIN) 1 g IVPB  1 g Intravenous Q24H  . clopidogrel  75 mg Oral q morning - 10a  . docusate sodium  100 mg Oral BID  . enoxaparin (LOVENOX) injection  40 mg Subcutaneous Q24H  . insulin detemir  16 Units Subcutaneous QHS  . levothyroxine  137 mcg Oral QAC breakfast  . losartan  25 mg Oral q morning - 10a  . magnesium oxide  800 mg Oral QHS  . metoprolol succinate  25 mg Oral q morning - 10a  . senna  1 tablet Oral BID   Continuous Infusions:   . sodium chloride 75 mL/hr at 03/09/12 1322   PRN Meds:.HYDROcodone-acetaminophen, HYDROmorphone (DILAUDID) injection, ondansetron (ZOFRAN) IV, ondansetron (ZOFRAN) IV, ondansetron  Allergies:  Allergies  Allergen Reactions  . Novolog (Insulin Aspart) Rash    No family history on file.  Social History:  reports that she has quit smoking. She has never used smokeless tobacco. She reports that she does not drink alcohol or use illicit drugs.  ROS: A complete review of systems was performed.  All systems are negative except for pertinent findings as noted.  Physical Exam:  Vital signs in last 24 hours: Temp:  [97.9 F (36.6 C)-99.2 F (37.3 C)] 97.9 F (36.6 C) (01/02 0546) Pulse Rate:  [70-89] 70  (01/02 0546) Resp:  [18-28] 22  (01/02 0546) BP: (111-149)/(62-95) 149/79 mmHg (01/02 0546) SpO2:  [96 %-99 %] 96 % (01/02 0546) Weight:  [79.017 kg (174 lb 3.2 oz)] 79.017 kg (174 lb 3.2 oz) (01/02 0250) General:  Alert and oriented, No acute distress HEENT: Normocephalic, atraumatic Neck: No JVD or lymphadenopathy Cardiovascular: Regular rate and rhythm Lungs: Clear bilaterally Abdomen: Soft, nontender, nondistended, no abdominal masses Back: No CVA tenderness; reproducible tenderness/pain over lumbar spine and SI joints Extremities: No edema Neurologic: Grossly intact  Laboratory Data:   [facility] 03/09/12 0337 03/08/12  2015  WBC 8.5 13.1*  HGB 10.4* 12.4  HCT 32.3* 38.5  PLT 206 249     Basename 03/09/12 0337 03/08/12 2015  NA 132* 134*  K 4.1 5.0  CL 97 100  GLUCOSE 133* 183*  BUN 25* 27*  CALCIUM 8.8 9.5  CREATININE 1.35* 1.39*     Results for orders placed during the hospital encounter of 03/08/12 (from the past 24 hour(s))  CBC WITH DIFFERENTIAL     Status: Abnormal   Collection Time   03/08/12  8:15 PM      Component Value Range   WBC 13.1 (*) 4.0 - 10.5 K/uL   RBC 4.15  3.87 - 5.11 MIL/uL   Hemoglobin 12.4  12.0 - 15.0 g/dL   HCT  38.5  36.0 - 46.0 %   MCV 92.8  78.0 - 100.0 fL   MCH 29.9  26.0 - 34.0 pg   MCHC 32.2  30.0 - 36.0 g/dL   RDW 14.4  11.5 - 15.5 %   Platelets 249  150 - 400 K/uL   Neutrophils Relative 92 (*) 43 - 77 %   Neutro Abs 12.1 (*) 1.7 - 7.7 K/uL   Lymphocytes Relative 3 (*) 12 - 46 %   Lymphs Abs 0.4 (*) 0.7 - 4.0 K/uL   Monocytes Relative 5  3 - 12 %   Monocytes Absolute 0.7  0.1 - 1.0 K/uL   Eosinophils Relative 0  0 - 5 %   Eosinophils Absolute 0.0  0.0 - 0.7 K/uL   Basophils Relative 0  0 - 1 %   Basophils Absolute 0.0  0.0 - 0.1 K/uL  COMPREHENSIVE METABOLIC PANEL     Status: Abnormal   Collection Time   03/08/12  8:15 PM      Component Value Range   Sodium 134 (*) 135 - 145 mEq/L   Potassium 5.0  3.5 - 5.1 mEq/L   Chloride 100  96 - 112 mEq/L   CO2 21  19 - 32 mEq/L   Glucose, Bld 183 (*) 70 - 99 mg/dL   BUN 27 (*) 6 - 23 mg/dL   Creatinine, Ser 1.39 (*) 0.50 - 1.10 mg/dL   Calcium 9.5  8.4 - 10.5 mg/dL   Total Protein 7.0  6.0 - 8.3 g/dL   Albumin 2.9 (*) 3.5 - 5.2 g/dL   AST 473 (*) 0 - 37 U/L   ALT 341 (*) 0 - 35 U/L   Alkaline Phosphatase 151 (*) 39 - 117 U/L   Total Bilirubin 3.1 (*) 0.3 - 1.2 mg/dL   GFR calc non Af Amer 37 (*) >90 mL/min   GFR calc Af Amer 43 (*) >90 mL/min  URINALYSIS, ROUTINE W REFLEX MICROSCOPIC     Status: Abnormal   Collection Time   03/08/12  8:52 PM      Component Value Range   Color, Urine ORANGE (*) YELLOW     APPearance TURBID (*) CLEAR   Specific Gravity, Urine 1.019  1.005 - 1.030   pH 6.0  5.0 - 8.0   Glucose, UA NEGATIVE  NEGATIVE mg/dL   Hgb urine dipstick SMALL (*) NEGATIVE   Bilirubin Urine LARGE (*) NEGATIVE   Ketones, ur TRACE (*) NEGATIVE mg/dL   Protein, ur 100 (*) NEGATIVE mg/dL   Urobilinogen, UA 1.0  0.0 - 1.0 mg/dL   Nitrite NEGATIVE  NEGATIVE   Leukocytes, UA LARGE (*) NEGATIVE  URINE MICROSCOPIC-ADD ON     Status: Abnormal   Collection Time   03/08/12  8:52 PM      Component Value Range   Squamous Epithelial / LPF MANY (*) RARE   WBC, UA TOO NUMEROUS TO COUNT  <3 WBC/hpf   RBC / HPF 7-10  <3 RBC/hpf   Bacteria, UA MANY (*) RARE  AMMONIA     Status: Abnormal   Collection Time   03/08/12 10:21 PM      Component Value Range   Ammonia <10 (*) 11 - 60 umol/L  TSH     Status: Abnormal   Collection Time   03/09/12  3:37 AM      Component Value Range   TSH 0.218 (*) 0.350 - 4.500 uIU/mL  HEMOGLOBIN A1C     Status: Abnormal  Collection Time   03/09/12  3:37 AM      Component Value Range   Hemoglobin A1C 7.1 (*) <5.7 %   Mean Plasma Glucose 157 (*) <117 mg/dL  TROPONIN I     Status: Normal   Collection Time   03/09/12  3:37 AM      Component Value Range   Troponin I <0.30  <0.30 ng/mL  CBC     Status: Abnormal   Collection Time   03/09/12  3:37 AM      Component Value Range   WBC 8.5  4.0 - 10.5 K/uL   RBC 3.55 (*) 3.87 - 5.11 MIL/uL   Hemoglobin 10.4 (*) 12.0 - 15.0 g/dL   HCT 32.3 (*) 36.0 - 46.0 %   MCV 91.0  78.0 - 100.0 fL   MCH 29.3  26.0 - 34.0 pg   MCHC 32.2  30.0 - 36.0 g/dL   RDW 14.2  11.5 - 15.5 %   Platelets 206  150 - 400 K/uL  COMPREHENSIVE METABOLIC PANEL     Status: Abnormal   Collection Time   03/09/12  3:37 AM      Component Value Range   Sodium 132 (*) 135 - 145 mEq/L   Potassium 4.1  3.5 - 5.1 mEq/L   Chloride 97  96 - 112 mEq/L   CO2 22  19 - 32 mEq/L   Glucose, Bld 133 (*) 70 - 99 mg/dL   BUN 25 (*) 6 - 23 mg/dL   Creatinine, Ser 1.35 (*)  0.50 - 1.10 mg/dL   Calcium 8.8  8.4 - 10.5 mg/dL   Total Protein 6.3  6.0 - 8.3 g/dL   Albumin 2.6 (*) 3.5 - 5.2 g/dL   AST 322 (*) 0 - 37 U/L   ALT 262 (*) 0 - 35 U/L   Alkaline Phosphatase 127 (*) 39 - 117 U/L   Total Bilirubin 2.9 (*) 0.3 - 1.2 mg/dL   GFR calc non Af Amer 39 (*) >90 mL/min   GFR calc Af Amer 45 (*) >90 mL/min  GLUCOSE, CAPILLARY     Status: Abnormal   Collection Time   03/09/12  6:58 AM      Component Value Range   Glucose-Capillary 166 (*) 70 - 99 mg/dL  TROPONIN I     Status: Normal   Collection Time   03/09/12  8:50 AM      Component Value Range   Troponin I <0.30  <0.30 ng/mL  GLUCOSE, CAPILLARY     Status: Abnormal   Collection Time   03/09/12 12:13 PM      Component Value Range   Glucose-Capillary 219 (*) 70 - 99 mg/dL   Comment 1 Notify RN     No results found for this or any previous visit (from the past 240 hour(s)).  Renal Function:  Basename 03/09/12 [id number] 03/08/12 2015  CREATININE 1.35* 1.39*   Estimated Creatinine Clearance: 42 ml/min (by C-G formula based on Cr of 1.35).  Radiologic Imaging: Ct Abdomen Pelvis Wo Contrast  03/09/2012  *RADIOLOGY REPORT*  Clinical Data: Confusion; weakness.  Nausea, vomiting and diarrhea. Abdominal pain.  CT ABDOMEN AND PELVIS WITHOUT CONTRAST  Technique:  Multidetector CT imaging of the abdomen and pelvis was performed following the standard protocol without intravenous contrast.  Comparison: CT of the abdomen and pelvis performed 12/09/2009  Findings: The patient is status post median sternotomy.  There are changes of left-sided talc pleurodesis, with mild associated pleural thickening.  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  The liver and spleen are unremarkable in appearance.  The gallbladder remains distended, grossly unchanged from 2011. Scattered stones are seen layering dependently within the gallbladder.  The pancreas  and adrenal glands are unremarkable.  There is significantly worsened chronic severe left-sided hydronephrosis, with marked left renal atrophy.  The proximal left ureter is markedly dilated, to the level of its passage across the left side of the aortobifemoral bypass graft.  This may reflect a focal stricture of the ureter as it passes over the stent graft.  Scattered hyperdense and hypodense cysts are seen throughout both kidneys, more prominent on the left.  Nonspecific perinephric stranding and fluid is seen bilaterally, worse on the left. No hydronephrosis is seen on the right side.  No obstructing ureteral stones are seen.  Calcifications are noted along the branches of both renal arteries.  Postoperative change is noted overlying the right kidney, at the right renal hilum and about the mid abdominal aorta.  No free fluid is identified.  The small bowel is unremarkable in appearance.  The stomach is within normal limits.  No acute vascular abnormalities are seen.  Scattered calcification is noted along the abdominal aorta and its branches.  The patient's aortobifemoral bypass graft is grossly unremarkable, though not well assessed without contrast.  There is marked calcification involving the occluded native aortoiliac arterial branches.  The appendix is not definitely seen; there is no evidence for appendicitis.  Scattered diverticulosis is noted along the entirety of the colon, more prominent along the descending and sigmoid colon, without evidence of diverticulitis.  Contrast progresses to the level of the proximal transverse colon.  The bladder is mildly distended and grossly unremarkable.  The patient is status post hysterectomy; no suspicious adnexal masses are seen.  No inguinal lymphadenopathy is seen.  There is relatively stable focal ectasia of the right common femoral artery near the anastomosis of the bypass graft and native vessel, measuring 1.9 cm in AP dimension.  Postoperative change is noted  at the left inguinal region.  No acute osseous abnormalities are identified.  There is stable chronic compression deformity of vertebral body T12.  IMPRESSION:  1.  No definite acute abnormality seen to explain the patient's symptoms. 2.  Significantly worsened chronic severe left-sided hydronephrosis, with marked left renal atrophy.  There is marked dilatation of the proximal left ureter, to the level of its passage across the left side of the aortobifemoral bypass graft.  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 3.  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 4.  Aortobifemoral bypass graft is grossly unremarkable, though not well assessed without contrast. 5.  Scattered diverticulosis along the entirety of the colon, more prominent along the descending and sigmoid colon, without evidence of diverticulitis. 6.  Scattered hyperdense and hypodense cysts throughout both kidneys, more prominent on the left. 7.  Changes of left-sided talc pleurodesis, with mild associated pleural thickening. 8.  Cholelithiasis noted; gallbladder otherwise unremarkable.   Original Report Authenticated By: Santa Lighter, M.D.    Dg Chest 2 View  03/08/2012  *RADIOLOGY REPORT*  Clinical Data: Weakness, emesis, diarrhea, altered mental status  CHEST - 2 VIEW  Comparison: Prior chest x-ray 01/12/2010  Findings: Interval development of a small left pleural effusion.  The lungs are otherwise clear.  No overt pulmonary edema or focal airspace consolidation.  Status post median sternotomy with evidence of multivessel CABG including LIMA bypass.  Aortic atherosclerosis.  No acute osseous abnormality.  IMPRESSION:  1.  Interval development of a small left pleural effusion since the prior chest x-ray on 01/12/2010. 2.  Otherwise, no acute cardiopulmonary disease.   Original Report Authenticated By: Jacqulynn Cadet, M.D.     Ct Head Wo Contrast  03/08/2012  *RADIOLOGY REPORT*  Clinical Data: Altered mental status, weakness  CT HEAD WITHOUT CONTRAST  Technique:  Contiguous axial images were obtained from the base of the skull through the vertex without contrast.  Comparison: Prior CT head 01/11/2010  Findings: No noncontrast CT evidence of acute intracranial hemorrhage, acute infarction, mass lesion, mass effect, hydrocephalus or midline shift.  Similar degree of global cerebral volume loss commensurate with age.  Interval progression of diffuse bilateral periventricular, subcortical and deep white matter hypoattenuation.  The pattern is nonspecific but most consistent with the sequela of longstanding microvascular ischemia.  Stable mild ventriculomegaly likely secondary to central atrophy.  Foci of hypoattenuation in the left caudate head similar to prior and favored to represent remote lacunar infarcts versus dilated perivascular spaces.  Diffuse intracranial atherosclerotic calcification of the bilateral cavernous carotid arteries and vertebral arteries in the foramen magnum.  Paranasal sinuses and mastoid air cells are well-aerated.  Globes are intact.  No focal calvarial abnormality.  IMPRESSION:  1.  No acute intracranial abnormality.  2.  Interval progression of bilateral periventricular, subcortical and deep white matter hypoattenuation compared to 01/11/2010.  The pattern is nonspecific but most consistent with the sequela of longstanding microvascular ischemia as can be seen in patients with diabetes and / or hypertension.   Original Report Authenticated By: Jacqulynn Cadet, M.D.     Impression/Assessment:  1.) UTI 2.) Microhematuria 3.) chronic left sided hydroureteronephrosis which has progressed ; left renal atrophy  Plan:  1.) F/u urine culture.  Continue current Abx until results avail for review then change if necessary for 7 day course  2.) Possible related to UTI.  Repeat UA after resolution of  infection   3.) Dr. Janice Norrie will review CT and discuss with pt. 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   She will probably require left ureteral stent placement. Her back pain is mostly related to arthritic/musculoskeletal changes based on history and exam.  Explained to pt that her back pain may improve somewhat with stent placement but that it is unlikely.   Marcie Bal 03/09/2012, 1:23 PM  I reviewed the CT scan.  There is worsening of chronic left hydronephrosis with thinning of the  renal parenchyma. I agree with Leverne Humbles assessment and treatment plan.  I discussed the options with the patient.  They are: cystoscopy, retrograde pyelogram and JJ stent.  If the stent can not be inserted she would need percutaneous nephrostomy.  The other option is not to do anything in which case she would end up losing the left kidney.  But she would like to have something done.  Will proceed with stent placement in AM.  The procedure, risks, benefits were discussed with her.  The risks include but are not limited to hemorrhage, infection, inability to insert stent, ureteral injury.  She understands and wishes to proceed.

## 2012-03-09 NOTE — Progress Notes (Signed)
[facility] PROGRESS NOTE  KALYANA FLUHR [id number] DOB: 1941/01/24 DOA: 03/08/2012 PCP: [name], MD  Brief narrative: [name] [name] is a 72 year old woman with a past medical history of diabetes, congestive heart failure, coronary artery disease status post CABG who was admitted to the hospital 03/09/2012 with altered mental status and weakness, found to have a urinary tract infection upon initial evaluation.  Assessment/Plan: Principal Problem:  *Altered mental status / acute encephalopathy secondary to urinary tract infection  Likely acute encephalopathy from urinary tract infection.  Followup urine cultures.  Continue empiric Rocephin. Active Problems:  Elevated LFTs  Unclear etiology. Check coagulation studies to evaluate synthetic capacity. Check acute hepatitis panel.  Liver unremarkable on CT scan.  CAD (coronary artery disease) / history of CABG  Continue aspirin, Plavix, metoprolol, Crestor and Cozaar.  Troponins negative x2.  Abdominal pain  No evidence of obstruction on plain films. May be from constipation.  Severe chronic left hydronephrosis and diverticulosis without diverticulitis also noted on CT scan done 03/09/2012  Diabetes mellitus, type II  Hemoglobin A1c 7.1%.  Continue Lantus. Discontinue SSI as patient allergic to NovoLog.  Arthritis  Symptomatic treatment as needed.  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  Control BP.  HTN (hypertension)  Continue Cozaar, metoprolol. Blood pressure reasonable peer  Acute kidney injury  Baseline creatinine 1.06 on 01/13/2010. Suspect some element of chronic kidney disease with chronic left hydronephrosis given CT scan findings. Continue IV fluids.  Left hydronephrosis  Request urology consult.  Hypothyroidism  TSH slightly low. Check free T4  Code Status: Full. Family Communication: None at bedside. Disposition Plan: Home when stable.   Medical Consultants:  Urology  Other Consultants:  Physical  therapy  Anti-infectives:   Cipro 03/08/2012---> 03/08/2012  Rocephin 03/11/2012--->  HPI/Subjective: Mrs. [name] describes left-sided flank/back pain for at least a year. She now has intermittent left-sided crampy abdominal pain. Feels better than she did when she came in last night. Low-grade fever noted.  Objective: Filed Vitals:   03/08/12 2220 03/09/12 0210 03/09/12 0250 03/09/12 0546  BP:  145/62 139/79 149/79  Pulse:  81 80 70  Temp:  98.8 F (37.1 C) 99.2 F (37.3 C) 97.9 F (36.6 C)  TempSrc:  Oral Oral Oral  Resp:  28 20 22   Height:   5\' 7"  (1.702 m)   Weight:   79.017 kg (174 lb 3.2 oz)   SpO2: 99% 99% 98% 96%    Intake/Output Summary (Last 24 hours) at 03/09/12 1147 Last data filed at 03/09/12 0826  Gross per 24 hour  Intake  542.5 ml  Output    425 ml  Net  117.5 ml    Exam: Gen:  NAD Cardiovascular:  RRR, No M/R/G Respiratory:  Lungs CTAB Gastrointestinal:  Abdomen soft, NT/ND, + BS Extremities:  No C/E/C  Data Reviewed: Basic Metabolic Panel:  Lab [id number] 03/08/12 2015  NA 132* 134*  K 4.1 5.0  CL 97 100  CO2 22 21  GLUCOSE 133* 183*  BUN 25* 27*  CREATININE 1.35* 1.39*  CALCIUM 8.8 9.5  MG -- --  PHOS -- --   GFR Estimated Creatinine Clearance: 42 ml/min (by C-G formula based on Cr of 1.35). Liver Function Tests:  Lab 03/09/12 0337 03/08/12 2015  AST 322* 473*  ALT 262* 341*  ALKPHOS 127* 151*  BILITOT 2.9* 3.1*  PROT 6.3 7.0  ALBUMIN 2.6* 2.9*    Lab 03/08/12 2221  AMMONIA <10*   CBC:  Lab 03/09/12 0337 03/08/12 2015  WBC 8.5 13.1*  NEUTROABS -- 12.1*  HGB 10.4* 12.4  HCT 32.3* 38.5  MCV 91.0 92.8  PLT 206 249   Cardiac Enzymes:  Lab 03/09/12 0850 03/09/12 0337  CKTOTAL -- --  CKMB -- --  CKMBINDEX -- --  TROPONINI <0.30 <0.30   CBG:  Lab 03/09/12 0658  GLUCAP 166*   Hgb A1c  Basename 03/09/12 0337  HGBA1C 7.1*   Thyroid function studies  Basename 03/09/12 0337  TSH 0.218*  T4TOTAL --  T3FREE --   THYROIDAB --   Microbiology No results found for this or any previous visit (from the past 240 hour(s)).   Procedures and Diagnostic Studies:  Ct Abdomen Pelvis Wo Contrast 03/09/2012 IMPRESSION:  1.  No definite acute abnormality seen to explain the patient's symptoms. 2.  Significantly worsened chronic severe left-sided hydronephrosis, with marked left renal atrophy.  There is marked dilatation of the proximal left ureter, to the level of its passage across the left side of the aortobifemoral bypass graft.  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 3.  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 4.  Aortobifemoral bypass graft is grossly unremarkable, though not well assessed without contrast. 5.  Scattered diverticulosis along the entirety of the colon, more prominent along the descending and sigmoid colon, without evidence of diverticulitis. 6.  Scattered hyperdense and hypodense cysts throughout both kidneys, more prominent on the left. 7.  Changes of left-sided talc pleurodesis, with mild associated pleural thickening. 8.  Cholelithiasis noted; gallbladder otherwise unremarkable.   Original Report Authenticated By: Santa Lighter, M.D.     Dg Chest 2 View 03/08/2012 IMPRESSION:  1.  Interval development of a small left pleural effusion since the prior chest x-ray on 01/12/2010. 2.  Otherwise, no acute cardiopulmonary disease.   Original Report Authenticated By: Jacqulynn Cadet, M.D.     Ct Head Wo Contrast 03/08/2012  IMPRESSION:  1.  No acute intracranial abnormality.  2.  Interval progression of bilateral periventricular, subcortical and deep white matter hypoattenuation compared to 01/11/2010.  The pattern is nonspecific but most consistent with the sequela of longstanding microvascular ischemia as can be seen in patients with diabetes and / or hypertension.   Original  Report Authenticated By: Jacqulynn Cadet, M.D.     Scheduled Meds:   . sodium chloride   Intravenous STAT  . aspirin  81 mg Oral QPM  . atorvastatin  80 mg Oral q1800  . cefTRIAXone (ROCEPHIN) 1 g IVPB  1 g Intravenous Q24H  . clopidogrel  75 mg Oral q morning - 10a  . docusate sodium  100 mg Oral BID  . enoxaparin (LOVENOX) injection  40 mg Subcutaneous Q24H  . insulin detemir  16 Units Subcutaneous QHS  . levothyroxine  137 mcg Oral QAC breakfast  . losartan  25 mg Oral q morning - 10a  . magnesium oxide  800 mg Oral QHS  . metoprolol succinate  25 mg Oral q morning - 10a  . senna  1 tablet Oral BID   Continuous Infusions:   . sodium chloride 75 mL/hr at 03/09/12 0258    Time spent: 35 minutes.   LOS: 1 day   RAMA,CHRISTINA  [facility] Pager [phone].  If 8PM-8AM, please contact night-coverage at [email or web address], password [facility] 03/09/2012, 11:47 AM

## 2012-03-09 NOTE — Progress Notes (Signed)
Nutrition Brief Note  Patient identified on the Malnutrition Screening Tool (MST) Report  Body mass index is 27.28 kg/(m^2). Patient meets criteria for overweight based on current BMI.   Current diet order is Heart Healthy, patient is consuming approximately 100% of meals at this time. Labs and medications reviewed.   Pt admitted with weakness and AMS r/t UTI.  Current alert and oriented, eating 100% of meals.  No recent evidence of wt loss. No nutrition interventions warranted at this time. If nutrition issues arise, please consult RD.   Brynda Greathouse, MS RD LDN Clinical Inpatient Dietitian Pager: [phone] Weekend/After hours pager: [phone]

## 2012-03-10 ENCOUNTER — Encounter (HOSPITAL_COMMUNITY)
Admission: EM | Disposition: A | Discharge status: Home / Self Care | Payer: Self-pay | Source: Home / Self Care | Attending: Internal Medicine

## 2012-03-10 ENCOUNTER — Encounter (HOSPITAL_COMMUNITY): Payer: Self-pay | Admitting: Anesthesiology

## 2012-03-10 ENCOUNTER — Inpatient Hospital Stay (HOSPITAL_COMMUNITY): Payer: Medicare Other

## 2012-03-10 ENCOUNTER — Inpatient Hospital Stay (HOSPITAL_COMMUNITY): Payer: Medicare Other | Admitting: Anesthesiology

## 2012-03-10 HISTORY — PX: CYSTOSCOPY W/ URETERAL STENT PLACEMENT: SHX1429

## 2012-03-10 LAB — PROTIME-INR
INR: 1.11 (ref 0.00–1.49)
Prothrombin Time: 14.2 seconds (ref 11.6–15.2)

## 2012-03-10 LAB — CBC
MCHC: 32.4 g/dL (ref 30.0–36.0)
MCV: 92.1 fL (ref 78.0–100.0)
Platelets: 217 10*3/uL (ref 150–400)
RDW: 14.4 % (ref 11.5–15.5)
WBC: 4.5 10*3/uL (ref 4.0–10.5)

## 2012-03-10 LAB — URINE CULTURE: Colony Count: 40000

## 2012-03-10 LAB — COMPREHENSIVE METABOLIC PANEL
ALT: 232 U/L — ABNORMAL HIGH (ref 0–35)
Albumin: 2.3 g/dL — ABNORMAL LOW (ref 3.5–5.2)
Alkaline Phosphatase: 157 U/L — ABNORMAL HIGH (ref 39–117)
BUN: 18 mg/dL (ref 6–23)
Chloride: 104 mEq/L (ref 96–112)
Glucose, Bld: 112 mg/dL — ABNORMAL HIGH (ref 70–99)
Potassium: 4.3 mEq/L (ref 3.5–5.1)
Sodium: 135 mEq/L (ref 135–145)
Total Bilirubin: 1.4 mg/dL — ABNORMAL HIGH (ref 0.3–1.2)

## 2012-03-10 LAB — SURGICAL PCR SCREEN
MRSA, PCR: NEGATIVE
Staphylococcus aureus: NEGATIVE

## 2012-03-10 LAB — GLUCOSE, CAPILLARY
Glucose-Capillary: 88 mg/dL (ref 70–99)
Glucose-Capillary: 132 mg/dL — ABNORMAL HIGH (ref 70–99)

## 2012-03-10 SURGERY — CYSTOSCOPY, WITH RETROGRADE PYELOGRAM AND URETERAL STENT INSERTION
Anesthesia: Monitor Anesthesia Care | Site: Ureter | Laterality: Left | Wound class: Clean Contaminated

## 2012-03-10 MED ORDER — HYDROMORPHONE HCL PF 1 MG/ML IJ SOLN
0.5000 mg | INTRAMUSCULAR | Status: DC | PRN
  Administered 2012-03-10 – 2012-03-11 (×4): 0.5 mg via INTRAVENOUS
  Filled 2012-03-10 (×4): qty 1

## 2012-03-10 MED ORDER — FENTANYL CITRATE 0.05 MG/ML IJ SOLN
25.0000 ug | INTRAMUSCULAR | Status: DC | PRN
  Administered 2012-03-10: 50 ug via INTRAVENOUS
  Administered 2012-03-10 (×4): 25 ug via INTRAVENOUS

## 2012-03-10 MED ORDER — ACETAMINOPHEN 10 MG/ML IV SOLN
INTRAVENOUS | Status: DC | PRN
  Administered 2012-03-10: 250 mg via INTRAVENOUS

## 2012-03-10 MED ORDER — FENTANYL CITRATE 0.05 MG/ML IJ SOLN
INTRAMUSCULAR | Status: DC | PRN
  Administered 2012-03-10 (×2): 50 ug via INTRAVENOUS

## 2012-03-10 MED ORDER — PROMETHAZINE HCL 25 MG/ML IJ SOLN: 6.2500 mg | INTRAMUSCULAR | Status: DC | PRN

## 2012-03-10 MED ORDER — KETAMINE HCL 10 MG/ML IJ SOLN
INTRAMUSCULAR | Status: DC | PRN
  Administered 2012-03-10: 20 mg via INTRAVENOUS
  Administered 2012-03-10: 10 mg via INTRAVENOUS

## 2012-03-10 MED ORDER — LACTATED RINGERS IV SOLN: INTRAVENOUS | Status: DC

## 2012-03-10 MED ORDER — STERILE WATER FOR IRRIGATION IR SOLN
Status: DC | PRN
  Administered 2012-03-10: 3000 mL via INTRAVESICAL

## 2012-03-10 MED ORDER — PROPOFOL 10 MG/ML IV EMUL
INTRAVENOUS | Status: DC | PRN
  Administered 2012-03-10: 100 ug/kg/min via INTRAVENOUS

## 2012-03-10 MED ORDER — IOHEXOL 300 MG/ML  SOLN
INTRAMUSCULAR | Status: DC | PRN
  Administered 2012-03-10: 10 mL

## 2012-03-10 MED ORDER — LACTATED RINGERS IV SOLN
INTRAVENOUS | Status: DC | PRN
  Administered 2012-03-10: 13:00:00 via INTRAVENOUS

## 2012-03-10 SURGICAL SUPPLY — 17 items
ADAPTER CATH URET PLST 4-6FR (CATHETERS) IMPLANT
BAG URO CATCHER STRL LF (DRAPE) ×3 IMPLANT
CATH INTERMIT  6FR 70CM (CATHETERS) ×3 IMPLANT
CATH URET 5FR 28IN CONE TIP (BALLOONS) ×1
CATH URET 5FR 70CM CONE TIP (BALLOONS) ×2 IMPLANT
CLOTH BEACON ORANGE TIMEOUT ST (SAFETY) ×3 IMPLANT
DRAPE CAMERA CLOSED 9X96 (DRAPES) ×3 IMPLANT
GLOVE BIOGEL M 7.0 STRL (GLOVE) ×3 IMPLANT
GOWN STRL NON-REIN LRG LVL3 (GOWN DISPOSABLE) ×6 IMPLANT
GUIDEWIRE STR DUAL SENSOR (WIRE) ×3 IMPLANT
MANIFOLD NEPTUNE II (INSTRUMENTS) ×3 IMPLANT
NS IRRIG 1000ML POUR BTL (IV SOLUTION) ×3 IMPLANT
PACK CYSTO (CUSTOM PROCEDURE TRAY) ×3 IMPLANT
STENT CONTOUR 6FRX26X.038 (STENTS) ×3 IMPLANT
STENT POLARIS LOOP 8FR X 26 CM (STENTS) ×3 IMPLANT
TUBING CONNECTING 10 (TUBING) ×3 IMPLANT
WIRE COONS/BENSON .038X145CM (WIRE) ×3 IMPLANT

## 2012-03-10 NOTE — Progress Notes (Signed)
Subjective: Patient reports Feels better this afternoon.  No back pain.  Objective: Vital signs in last 24 hours: Temp:  [97.5 F (36.4 C)-98 F (36.7 C)] 97.5 F (36.4 C) (01/03 1455) Pulse Rate:  [60-71] 63  (01/03 1455) Resp:  [17-21] 18  (01/03 1455) BP: (157-193)/(61-84) 164/61 mmHg (01/03 1455) SpO2:  [98 %-100 %] 100 % (01/03 1455)  Intake/Output from previous day: 01/02 0701 - 01/03 0700 In: 2570 [P.O.:710; I.V.:1800; IV Piggyback:60] Out: 2225 [Urine:2225] Intake/Output this shift: Total I/O In: 400 [I.V.:400] Out: 1025 [Urine:1025]  Physical Exam:  General: Alert and oriented.  Lungs - Normal respiratory effort, chest expands symmetrically.  Abdomen - Soft, non-tender & non-distended  Lab Results:  Basename 03/10/12 0431 03/09/12 0337 03/08/12 2015  HGB 11.0* 10.4* 12.4  HCT 33.9* 32.3* 38.5   BMET  Basename 03/10/12 0431 03/09/12 0337  NA 135 132*  K 4.3 4.1  CL 104 97  CO2 24 22  GLUCOSE 112* 133*  BUN 18 25*  CREATININE 1.14* 1.35*  CALCIUM 9.0 8.8    Basename 03/10/12 0431  LABPT --  INR 1.11   No results found for this basename: LABURIN:1 in the last 72 hours Results for orders placed during the hospital encounter of 03/08/12  URINE CULTURE     Status: Normal (Preliminary result)   Collection Time   03/08/12  8:52 PM      Component Value Range Status Comment   Specimen Description URINE, RANDOM   Final    Special Requests NONE   Final    Culture  Setup Time 03/09/2012 04:06   Final    Colony Count 40,000 COLONIES/ML   Final    Culture GRAM NEGATIVE RODS   Final    Report Status PENDING   Incomplete   SURGICAL PCR SCREEN     Status: Normal   Collection Time   03/10/12  3:35 AM      Component Value Range Status Comment   MRSA, PCR NEGATIVE  NEGATIVE Final    Staphylococcus aureus NEGATIVE  NEGATIVE Final     Studies/Results: Ct Abdomen Pelvis Wo Contrast  03/09/2012  *RADIOLOGY REPORT*  Clinical Data: Confusion; weakness.  Nausea,  vomiting and diarrhea. Abdominal pain.  CT ABDOMEN AND PELVIS WITHOUT CONTRAST  Technique:  Multidetector CT imaging of the abdomen and pelvis was performed following the standard protocol without intravenous contrast.  Comparison: CT of the abdomen and pelvis performed 12/09/2009  Findings: The patient is status post median sternotomy.  There are changes of left-sided talc pleurodesis, with mild associated pleural thickening.  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  The liver and spleen are unremarkable in appearance.  The gallbladder remains distended, grossly unchanged from 2011. Scattered stones are seen layering dependently within the gallbladder.  The pancreas and adrenal glands are unremarkable.  There is significantly worsened chronic severe left-sided hydronephrosis, with marked left renal atrophy.  The proximal left ureter is markedly dilated, to the level of its passage across the left side of the aortobifemoral bypass graft.  This may reflect a focal stricture of the ureter as it passes over the stent graft.  Scattered hyperdense and hypodense cysts are seen throughout both kidneys, more prominent on the left.  Nonspecific perinephric stranding and fluid is seen bilaterally, worse on the left. No hydronephrosis is seen on the right side.  No obstructing ureteral stones are seen.  Calcifications are  noted along the branches of both renal arteries.  Postoperative change is noted overlying the right kidney, at the right renal hilum and about the mid abdominal aorta.  No free fluid is identified.  The small bowel is unremarkable in appearance.  The stomach is within normal limits.  No acute vascular abnormalities are seen.  Scattered calcification is noted along the abdominal aorta and its branches.  The patient's aortobifemoral bypass graft is grossly unremarkable, though not well assessed without contrast.  There is  marked calcification involving the occluded native aortoiliac arterial branches.  The appendix is not definitely seen; there is no evidence for appendicitis.  Scattered diverticulosis is noted along the entirety of the colon, more prominent along the descending and sigmoid colon, without evidence of diverticulitis.  Contrast progresses to the level of the proximal transverse colon.  The bladder is mildly distended and grossly unremarkable.  The patient is status post hysterectomy; no suspicious adnexal masses are seen.  No inguinal lymphadenopathy is seen.  There is relatively stable focal ectasia of the right common femoral artery near the anastomosis of the bypass graft and native vessel, measuring 1.9 cm in AP dimension.  Postoperative change is noted at the left inguinal region.  No acute osseous abnormalities are identified.  There is stable chronic compression deformity of vertebral body T12.  IMPRESSION:  1.  No definite acute abnormality seen to explain the patient's symptoms. 2.  Significantly worsened chronic severe left-sided hydronephrosis, with marked left renal atrophy.  There is marked dilatation of the proximal left ureter, to the level of its passage across the left side of the aortobifemoral bypass graft.  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 3.  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 4.  Aortobifemoral bypass graft is grossly unremarkable, though not well assessed without contrast. 5.  Scattered diverticulosis along the entirety of the colon, more prominent along the descending and sigmoid colon, without evidence of diverticulitis. 6.  Scattered hyperdense and hypodense cysts throughout both kidneys, more prominent on the left. 7.  Changes of left-sided talc pleurodesis, with mild associated pleural thickening. 8.  Cholelithiasis noted; gallbladder otherwise  unremarkable.   Original Report Authenticated By: Santa Lighter, M.D.    Dg Chest 2 View  03/08/2012  *RADIOLOGY REPORT*  Clinical Data: Weakness, emesis, diarrhea, altered mental status  CHEST - 2 VIEW  Comparison: Prior chest x-ray 01/12/2010  Findings: Interval development of a small left pleural effusion. The lungs are otherwise clear.  No overt pulmonary edema or focal airspace consolidation.  Status post median sternotomy with evidence of multivessel CABG including LIMA bypass.  Aortic atherosclerosis.  No acute osseous abnormality.  IMPRESSION:  1.  Interval development of a small left pleural effusion since the prior chest x-ray on 01/12/2010. 2.  Otherwise, no acute cardiopulmonary disease.   Original Report Authenticated By: Jacqulynn Cadet, M.D.    Ct Head Wo Contrast  03/08/2012  *RADIOLOGY REPORT*  Clinical Data: Altered mental status, weakness  CT HEAD WITHOUT CONTRAST  Technique:  Contiguous axial images were obtained from the base of the skull through the vertex without contrast.  Comparison: Prior CT head 01/11/2010  Findings: No noncontrast CT evidence of acute intracranial hemorrhage, acute infarction, mass lesion, mass effect, hydrocephalus or midline shift.  Similar degree of global cerebral volume loss commensurate with age.  Interval progression of diffuse bilateral  periventricular, subcortical and deep white matter hypoattenuation.  The pattern is nonspecific but most consistent with the sequela of longstanding microvascular ischemia.  Stable mild ventriculomegaly likely secondary to central atrophy.  Foci of hypoattenuation in the left caudate head similar to prior and favored to represent remote lacunar infarcts versus dilated perivascular spaces.  Diffuse intracranial atherosclerotic calcification of the bilateral cavernous carotid arteries and vertebral arteries in the foramen magnum.  Paranasal sinuses and mastoid air cells are well-aerated.  Globes are intact.  No focal calvarial  abnormality.  IMPRESSION:  1.  No acute intracranial abnormality.  2.  Interval progression of bilateral periventricular, subcortical and deep white matter hypoattenuation compared to 01/11/2010.  The pattern is nonspecific but most consistent with the sequela of longstanding microvascular ischemia as can be seen in patients with diabetes and / or hypertension.   Original Report Authenticated By: Jacqulynn Cadet, M.D.     Assessment/Plan:  Left hydronephrosis  Can go home from a urologic standpoint.  Will follow as outpatient.   LOS: 2 days   Nakeitha Milligan-HENRY 03/10/2012, 5:58 PM

## 2012-03-10 NOTE — Op Note (Signed)
[name] [name] is a 72 y.o.   03/10/2012  Preop diagnosis: Left hydronephrosis  Postop diagnosis: Same  Procedure done: Cystoscopy, left retrograde pyelogram, insertion of double-J stent  Anesthesia: Monitor Anesthesia Care  Surgeon: [name] [name]. Aliana Kreischer  Indication: Patient is a 72 years old female who was found during workup of abdominal pain to have on CT scan  worsening of chronic left hydronephrosis. Her creatinine has come down to 1.14 from 1.39 on 1/1. She is now scheduled for cystoscopy, left retrograde pyelogram and double-J stent  Procedure: The patient was identified by her wrist band and proper timeout was taken.  Under general anesthesia she was prepped and draped and placed in the dorsolithotomy position. A panendoscope was inserted in the bladder. The bladder mucosa is erythematous. There is no stone or tumor in the bladder. The ureteral orifices are in normal position and shape.  Left retrograde pyelogram:  A cone-tip catheter was passed through the cystoscope and the left ureteral orifice. Contrast was then injected through the cone-tip catheter. The distal and mid ureter appear normal. Proximal ureter is markedly dilated as well as the renal pelvis and collecting system. The cone-tip catheter was removed. A sensor wire was passed through the cystoscope and the left ureter up into the renal pelvis. A #6 [place] open-ended catheter was then passed over the sensor wire in the proximal ureter. The sensor wire was then removed. Contrast was then injected through the open-ended catheter. There is no evidence of filling defect in the ureter. However there is an area of narrowing of the proximal ureter. The ureter proximal to that area is markedly dilated. The sensor wire was then passed through the open-ended catheter and the open-ended catheter was removed.  A #8 French-26 Polaris double-J stent was then passed over the sensor wire. The proximal curl of the stent is in the collecting  system. The loops of the stent were in the bladder. The bladder was then emptied and the cystoscope and sensor wire removed.  The patient tolerated the procedure well and left the OR in satisfactory condition to postanesthesia care unit.

## 2012-03-10 NOTE — Progress Notes (Signed)
[facility] PROGRESS NOTE  [place] RANSFORD [id number] DOB: 1940-05-21 DOA: 03/08/2012 PCP: [name], MD  Brief narrative: [name] [name] is a 72 year old woman with a past medical history of diabetes, congestive heart failure, coronary artery disease status post CABG who was admitted to the hospital 03/09/2012 with altered mental status and weakness, found to have a urinary tract infection upon initial evaluation.  Assessment/Plan: Principal Problem:  *Altered mental status / acute encephalopathy secondary to urinary tract infection  Likely acute encephalopathy from urinary tract infection.  Seems back to baseline.  Followup urine cultures.  Preliminary culture growing 40,000 colonies GNR.  Continue empiric Rocephin. Active Problems:  Elevated LFTs  Unclear etiology. Coagulation studies WNL. Acute hepatitis panel pending.  Hold statin.  Liver unremarkable on CT scan.  CAD (coronary artery disease) / history of CABG  Continue aspirin, Plavix, metoprolol, and Cozaar.  Troponins negative x2.  Abdominal pain  No evidence of obstruction on plain films.   Severe chronic left hydronephrosis and diverticulosis without diverticulitis also noted on CT scan done 03/09/2012  Diabetes mellitus, type II  Hemoglobin A1c 7.1%.  Continue Lantus. Discontinue SSI as patient allergic to NovoLog.  Arthritis  Symptomatic treatment as needed.  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  Control BP.  HTN (hypertension)  Continue Cozaar, metoprolol. Blood pressure reasonable.  Acute kidney injury /  Left hydronephrosis  Baseline creatinine 1.06 on 01/13/2010. Suspect some element of chronic kidney disease with chronic left hydronephrosis given CT scan findings. Continue IV fluids.  For ureteral stent placement today status post urology evaluation by Dr. Janice Norrie.  Hypothyroidism  TSH slightly low. Free T4 WNL.  Code Status: Full. Family Communication: Daughter [name] [name] [phone].  Updated at  bedside. Disposition Plan: Home when stable.   Medical Consultants:  Dr. Janice Norrie, Urology  Other Consultants:  Physical therapy  Anti-infectives:   Cipro 03/08/2012---> 03/08/2012  Rocephin 03/11/2012--->  HPI/Subjective: [name] is having left sided flank pain.  Moved her bowels yesterday.  No N/V.  No fevers.  Objective: Filed Vitals:   03/09/12 1445 03/09/12 2053 03/10/12 0555 03/10/12 1121  BP: 151/80 159/77 157/80 157/64  Pulse: 62 71 65 60  Temp: 97.9 F (36.6 C) 98 F (36.7 C) 97.8 F (36.6 C) 97.6 F (36.4 C)  TempSrc: Oral Oral Oral Oral  Resp: 20 20 18 18   Height:      Weight:      SpO2: 98% 98% 98% 98%    Intake/Output Summary (Last 24 hours) at 03/10/12 1241 Last data filed at 03/10/12 1130  Gross per 24 hour  Intake   2330 ml  Output   2875 ml  Net   -545 ml    Exam: Gen:  NAD Cardiovascular:  RRR, No M/R/G Respiratory:  Lungs CTAB Gastrointestinal:  Abdomen soft, NT/ND, + BS Extremities:  No C/E/C  Data Reviewed: Basic Metabolic Panel:  Lab [id number] 0431 03/09/12 0337 03/08/12 2015  NA 135 132* 134*  K 4.3 4.1 --  CL 104 97 100  CO2 24 22 21   GLUCOSE 112* 133* 183*  BUN 18 25* 27*  CREATININE 1.14* 1.35* 1.39*  CALCIUM 9.0 8.8 9.5  MG -- -- --  PHOS -- -- --   GFR Estimated Creatinine Clearance: 49.7 ml/min (by C-G formula based on Cr of 1.14). Liver Function Tests:  Lab 03/10/12 0431 03/09/12 0337 03/08/12 2015  AST 278* 322* 473*  ALT 232* 262* 341*  ALKPHOS 157* 127* 151*  BILITOT 1.4* 2.9* 3.1*  PROT 5.9* 6.3  7.0  ALBUMIN 2.3* 2.6* 2.9*    Lab 03/08/12 2221  AMMONIA <10*   CBC:  Lab 03/10/12 0431 03/09/12 0337 03/08/12 2015  WBC 4.5 8.5 13.1*  NEUTROABS -- -- 12.1*  HGB 11.0* 10.4* 12.4  HCT 33.9* 32.3* 38.5  MCV 92.1 91.0 92.8  PLT 217 206 249   Cardiac Enzymes:  Lab 03/09/12 1440 03/09/12 0850 03/09/12 0337  CKTOTAL -- -- --  CKMB -- -- --  CKMBINDEX -- -- --  TROPONINI <0.30 <0.30 <0.30   CBG:  Lab  03/10/12 1118 03/10/12 0800 03/09/12 2120 03/09/12 1654 03/09/12 1213  GLUCAP 132* 88 164* 207* 219*   Hgb A1c  Basename 03/09/12 0337  HGBA1C 7.1*   Thyroid function studies  Basename 03/09/12 0337  TSH 0.218*  T4TOTAL --  T3FREE --  THYROIDAB --   Microbiology Recent Results (from the past 240 hour(s))  URINE CULTURE     Status: Normal (Preliminary result)   Collection Time   03/08/12  8:52 PM      Component Value Range Status Comment   Specimen Description URINE, RANDOM   Final    Special Requests NONE   Final    Culture  Setup Time 03/09/2012 04:06   Final    Colony Count 40,000 COLONIES/ML   Final    Culture GRAM NEGATIVE RODS   Final    Report Status PENDING   Incomplete   SURGICAL PCR SCREEN     Status: Normal   Collection Time   03/10/12  3:35 AM      Component Value Range Status Comment   MRSA, PCR NEGATIVE  NEGATIVE Final    Staphylococcus aureus NEGATIVE  NEGATIVE Final      Procedures and Diagnostic Studies:  Ct Abdomen Pelvis Wo Contrast 03/09/2012 IMPRESSION:  1.  No definite acute abnormality seen to explain the patient's symptoms. 2.  Significantly worsened chronic severe left-sided hydronephrosis, with marked left renal atrophy.  There is marked dilatation of the proximal left ureter, to the level of its passage across the left side of the aortobifemoral bypass graft.  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 3.  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 4.  Aortobifemoral bypass graft is grossly unremarkable, though not well assessed without contrast. 5.  Scattered diverticulosis along the entirety of the colon, more prominent along the descending and sigmoid colon, without evidence of diverticulitis. 6.  Scattered hyperdense and hypodense cysts throughout both kidneys, more prominent on the left. 7.  Changes of left-sided talc  pleurodesis, with mild associated pleural thickening. 8.  Cholelithiasis noted; gallbladder otherwise unremarkable.   Original Report Authenticated By: Santa Lighter, M.D.     Dg Chest 2 View 03/08/2012 IMPRESSION:  1.  Interval development of a small left pleural effusion since the prior chest x-ray on 01/12/2010. 2.  Otherwise, no acute cardiopulmonary disease.   Original Report Authenticated By: Jacqulynn Cadet, M.D.     Ct Head Wo Contrast 03/08/2012  IMPRESSION:  1.  No acute intracranial abnormality.  2.  Interval progression of bilateral periventricular, subcortical and deep white matter hypoattenuation compared to 01/11/2010.  The pattern is nonspecific but most consistent with the sequela of longstanding microvascular ischemia as can be seen in patients with diabetes and / or hypertension.   Original Report Authenticated By: Jacqulynn Cadet, M.D.     Scheduled Meds:    .  aspirin  81 mg Oral QPM  . atorvastatin  80 mg Oral q1800  . cefTRIAXone (ROCEPHIN) 1 g IVPB  1 g Intravenous Q24H  . clopidogrel  75 mg Oral q morning - 10a  . docusate sodium  100 mg Oral BID  . enoxaparin (LOVENOX) injection  40 mg Subcutaneous Q24H  . insulin detemir  16 Units Subcutaneous QHS  . levothyroxine  137 mcg Oral QAC breakfast  . losartan  25 mg Oral q morning - 10a  . magnesium oxide  800 mg Oral QHS  . metoprolol succinate  25 mg Oral q morning - 10a  . senna  1 tablet Oral BID   Continuous Infusions:    . sodium chloride 75 mL/hr at 03/10/12 0304    Time spent: 25 minutes.   LOS: 2 days   RAMA,CHRISTINA  [facility] Pager [phone].  If 8PM-8AM, please contact night-coverage at [email or web address], password [facility] 03/10/2012, 12:41 PM

## 2012-03-10 NOTE — Anesthesia Preprocedure Evaluation (Addendum)
Anesthesia Evaluation  Patient identified by MRN, date of birth, ID band Patient awake    Reviewed: Allergy & Precautions, H&P , NPO status , Patient's Chart, lab work & pertinent test results  Airway Mallampati: II TM Distance: >3 FB Neck ROM: Full    Dental No notable dental hx.    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%   Neuro/Psych negative neurological ROS  negative psych ROS   GI/Hepatic negative GI ROS, Neg liver ROS,   Endo/Other  diabetes, Oral Hypoglycemic AgentsHypothyroidism   Renal/GU negative Renal ROS  negative genitourinary   Musculoskeletal negative musculoskeletal ROS (+)   Abdominal   Peds negative pediatric ROS (+)  Hematology negative hematology ROS (+)   Anesthesia Other Findings   Reproductive/Obstetrics negative OB ROS                         Anesthesia Physical Anesthesia Plan  ASA: IV  Anesthesia Plan: MAC   Post-op Pain Management:    Induction: Intravenous  Airway Management Planned: Simple Face Mask  Additional Equipment:   Intra-op Plan:   Post-operative Plan:   Informed Consent: I have reviewed the patients History and Physical, chart, labs and discussed the procedure including the risks, benefits and alternatives for the proposed anesthesia with the patient or authorized representative who has indicated his/her understanding and acceptance.     Plan Discussed with: CRNA and Surgeon  Anesthesia Plan Comments:         Anesthesia Quick Evaluation

## 2012-03-10 NOTE — Progress Notes (Signed)
Dr. Janice Norrie ordered to give lovenox today.

## 2012-03-10 NOTE — Anesthesia Postprocedure Evaluation (Signed)
  Anesthesia Post-op Note  Patient: [name] [name]  Procedure(s) Performed: Procedure(s) (LRB): CYSTOSCOPY WITH RETROGRADE PYELOGRAM/URETERAL STENT PLACEMENT (Left)  Patient Location: PACU  Anesthesia Type: MAC  Level of Consciousness: awake and alert   Airway and Oxygen Therapy: Patient Spontanous Breathing  Post-op Pain: mild  Post-op Assessment: Post-op Vital signs reviewed, Patient's Cardiovascular Status Stable, Respiratory Function Stable, Patent Airway and No signs of Nausea or vomiting  Last Vitals:  Filed Vitals:   03/10/12 1121  BP: 157/64  Pulse: 60  Temp: 36.4 C  Resp: 18    Post-op Vital Signs: stable   Complications: No apparent anesthesia complications

## 2012-03-10 NOTE — Progress Notes (Signed)
PT Cancellation Note  Patient Details Name: [name] [name] MRN: [id number] DOB: [birth year]   Cancelled Treatment:    Reason Eval/Treat Not Completed: Patient at procedure or test/unavailable   Claretha Cooper 03/10/2012, 11:56 AM

## 2012-03-10 NOTE — Transfer of Care (Signed)
Immediate Anesthesia Transfer of Care Note  Patient: [name] [name]  Procedure(s) Performed: Procedure(s) (LRB) with comments: CYSTOSCOPY WITH RETROGRADE PYELOGRAM/URETERAL STENT PLACEMENT (Left)  Patient Location: PACU  Anesthesia Type:MAC  Level of Consciousness: awake, alert  and oriented  Airway & Oxygen Therapy: Patient Spontanous Breathing and Patient connected to face mask oxygen  Post-op Assessment: Report given to PACU RN and Post -op Vital signs reviewed and stable  Post vital signs: Reviewed and stable  Complications: No apparent anesthesia complications

## 2012-03-11 LAB — CBC
MCV: 92.1 fL (ref 78.0–100.0)
Platelets: 230 10*3/uL (ref 150–400)
RDW: 14.1 % (ref 11.5–15.5)
WBC: 5.6 10*3/uL (ref 4.0–10.5)

## 2012-03-11 LAB — COMPREHENSIVE METABOLIC PANEL
AST: 106 U/L — ABNORMAL HIGH (ref 0–37)
Albumin: 2.4 g/dL — ABNORMAL LOW (ref 3.5–5.2)
Chloride: 105 mEq/L (ref 96–112)
Creatinine, Ser: 1.03 mg/dL (ref 0.50–1.10)
Potassium: 3.8 mEq/L (ref 3.5–5.1)
Total Bilirubin: 0.9 mg/dL (ref 0.3–1.2)

## 2012-03-11 LAB — GLUCOSE, CAPILLARY: Glucose-Capillary: 97 mg/dL (ref 70–99)

## 2012-03-11 MED ORDER — DSS 100 MG PO CAPS: 100.0000 mg | ORAL_CAPSULE | Freq: Two times a day (BID) | ORAL | Status: DC

## 2012-03-11 MED ORDER — CIPROFLOXACIN HCL 500 MG PO TABS: 500.0000 mg | ORAL_TABLET | Freq: Two times a day (BID) | ORAL | Status: DC

## 2012-03-11 MED ORDER — CIPROFLOXACIN HCL 500 MG PO TABS
500.0000 mg | ORAL_TABLET | Freq: Two times a day (BID) | ORAL | Status: DC
  Administered 2012-03-11: 500 mg via ORAL
  Filled 2012-03-11 (×3): qty 1

## 2012-03-11 NOTE — Progress Notes (Signed)
Patient ID: [name] [name], female   DOB: 26-Mar-1940, 72 y.o.   MRN: [id number] 1 Day Post-Op Subjective: Patient reports no problems overnight. She is not having any significant flank discomfort. She is approximately 24 hours status post JJ stent placement. No obvious new issues.  Objective: Vital signs in last 24 hours: Temp:  [97 F (36.1 C)-97.6 F (36.4 C)] 97 F (36.1 C) (01/04 0429) Pulse Rate:  [60-63] 62  (01/04 0429) Resp:  [17-21] 18  (01/04 0429) BP: (157-193)/(61-84) 158/72 mmHg (01/04 0429) SpO2:  [98 %-100 %] 100 % (01/04 0429)  Intake/Output from previous day: 01/03 0701 - 01/04 0700 In: 400 [I.V.:400] Out: 1850 [Urine:1850] Intake/Output this shift: Total I/O In: 360 [P.O.:360] Out: -   Physical Exam:  Constitutional: Vital signs reviewed. WD WN in NAD   Eyes: PERRL, No scleral icterus.   Cardiovascular: RRR Pulmonary/Chest: Normal effort Abdominal: Soft. Non-tender Extremities: No cyanosis or edema   Lab Results:  Basename 03/11/12 0523 03/10/12 0431 03/09/12 0337  HGB 11.3* 11.0* 10.4*  HCT 35.1* 33.9* 32.3*   BMET  Basename 03/11/12 0523 03/10/12 0431  NA 138 135  K 3.8 4.3  CL 105 104  CO2 24 24  GLUCOSE 87 112*  BUN 14 18  CREATININE 1.03 1.14*  CALCIUM 8.9 9.0    Basename 03/10/12 0431  LABPT --  INR 1.11   No results found for this basename: LABURIN:1 in the last 72 hours Results for orders placed during the hospital encounter of 03/08/12  URINE CULTURE     Status: Normal   Collection Time   03/08/12  8:52 PM      Component Value Range Status Comment   Specimen Description URINE, RANDOM   Final    Special Requests NONE   Final    Culture  Setup Time 03/09/2012 04:06   Final    Colony Count 40,000 COLONIES/ML   Final    Culture CITROBACTER FREUNDII   Final    Report Status 03/10/2012 FINAL   Final    Organism ID, Bacteria CITROBACTER FREUNDII   Final   SURGICAL PCR SCREEN     Status: Normal   Collection Time   03/10/12  3:35 AM       Component Value Range Status Comment   MRSA, PCR NEGATIVE  NEGATIVE Final    Staphylococcus aureus NEGATIVE  NEGATIVE Final     Studies/Results: Dg C-arm 1-60 Min-no Report  03/10/2012  CLINICAL DATA: surgery   C-ARM 1-60 MINUTES  Fluoroscopy was utilized by the requesting physician.  No radiographic  interpretation.      Assessment/Plan:   Status post double-J stent placement for severe hydronephrosis. Urologically she is doing well. From our standpoint she can be discharged when stable with followup with Dr. Janice Norrie   LOS: 3 days   Abella Shugart S 03/11/2012, 10:07 AM

## 2012-03-11 NOTE — Discharge Summary (Signed)
Physician Discharge Summary  [name] [name] [id number] DOB: 1940-09-12 DOA: 03/08/2012  PCP: [name], MD  Admit date: 03/08/2012 Discharge date: 03/11/2012  Recommendations for Outpatient Follow-up:  1. F/U with Dr. Janice Norrie in 2 weeks for ureteral stent evaluation. 2. F/U LFTs in 1 week.  Hold statin until LFTs re-checked.  Discharge Diagnoses:  Principal Problem:  *Altered mental status Active Problems:  UTI (lower urinary tract infection)  CAD (coronary artery disease)  Abdominal pain  Diabetes mellitus, type II  Arthritis  HTN (hypertension)  Acute encephalopathy  Normocytic anemia  Elevated LFTs  AAA (abdominal aortic aneurysm)   Discharge Condition: Improved.  Diet recommendation: Carbohydrate-modified.  History of present illness:  Mrs. Lor is a 72 year old woman with a past medical history of diabetes, congestive heart failure, coronary artery disease status post CABG who was admitted to the hospital 03/09/2012 with altered mental status and weakness, found to have a urinary tract infection upon initial evaluation.  Further imaging demonstrated left hydronephrosis.   Hospital Course by problem:  Principal Problem:  *Altered mental status / acute encephalopathy secondary to urinary tract infection  Likely acute encephalopathy from urinary tract infection. Back to baseline at discharge.  Urine cultures + Citrobacter, Rocephin resistant. D/C Rocephin and started Cipro based on sensitivity data.  Will treat x 7 days. Active Problems:  Elevated LFTs  Unclear etiology. Coagulation studies WNL. Acute hepatitis panel negative. LFTs improving over time. May be statin related. Statin placed on hold 03/10/2012. Would keep on hold until she follows up with PCP. Liver unremarkable on CT scan. CAD (coronary artery disease) / history of CABG  Continue aspirin, Plavix, metoprolol, and Cozaar.  Troponins negative x2. Abdominal pain  No evidence of bowel obstruction on plain  films.  Severe chronic left hydronephrosis and diverticulosis without diverticulitis also noted on CT scan done 03/09/2012. Diabetes mellitus, type II  Hemoglobin A1c 7.1%.  Continue Lantus. Discontinue SSI as patient allergic to NovoLog. Arthritis  Symptomatic treatment as needed. 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 Control BP. HTN (hypertension)  Continue Cozaar, metoprolol. Blood pressure reasonable. Acute kidney injury / Left hydronephrosis  Baseline creatinine 1.06 on 01/13/2010. Creatinine now back to baseline values status post ureteral stent. Hypothyroidism  TSH slightly low. Free T4 WNL.  Procedures:  Cystoscopy, left retrograde pyelogram, insertion of double-J stent done by Dr. Janice Norrie 03/10/12.   Consultations:  Dr. Lowella Bandy, Urology  Discharge Exam: Filed Vitals:   03/11/12 0429  BP: 158/72  Pulse: 62  Temp: 97 F (36.1 C)  Resp: 18   Filed Vitals:   03/10/12 1445 03/10/12 1455 03/10/12 2139 03/11/12 0429  BP:  164/61 167/76 158/72  Pulse: 62 63 62 62  Temp: 97.6 F (36.4 C) 97.5 F (36.4 C) 97.5 F (36.4 C) 97 F (36.1 C)  TempSrc:   Oral Oral  Resp: 18 18 18 18   Height:      Weight:      SpO2: 100% 100% 100% 100%    Gen:  NAD Cardiovascular:  RRR, No M/R/G Respiratory: Lungs CTAB Gastrointestinal: Abdomen soft, NT/ND with normal active bowel sounds. Extremities: No C/E/C   Discharge Instructions      Discharge Orders    Future Orders Please Complete By Expires   Diet Carb Modified      Increase activity slowly      Call MD for:  temperature >100.4      Call MD for:  severe uncontrolled pain          Medication  List     As of 03/11/2012  2:39 PM    STOP taking these medications         rosuvastatin 20 MG tablet   Commonly known as: CRESTOR      TAKE these medications         aspirin 81 MG chewable tablet   Chew 81 mg by mouth every evening.      calcium-vitamin D 500-200 MG-UNIT per tablet   Commonly known as: OSCAL WITH D   Take 1 tablet by  mouth every morning.      ciprofloxacin 500 MG tablet   Commonly known as: CIPRO   Take 1 tablet (500 mg total) by mouth 2 (two) times daily.      clopidogrel 75 MG tablet   Commonly known as: PLAVIX   Take 75 mg by mouth every morning.      DSS 100 MG Caps   Take 100 mg by mouth 2 (two) times daily.      ferrous sulfate 325 (65 FE) MG tablet   Take 325 mg by mouth at bedtime.      HYDROcodone-acetaminophen 5-500 MG per tablet   Commonly known as: VICODIN   Take 1 tablet by mouth daily as needed. For pain      insulin detemir 100 UNIT/ML injection   Commonly known as: LEVEMIR   Inject 16 Units into the skin at bedtime.      levothyroxine 137 MCG tablet   Commonly known as: SYNTHROID, LEVOTHROID   Take 137 mcg by mouth daily before breakfast.      losartan 25 MG tablet   Commonly known as: COZAAR   Take 25 mg by mouth every morning.      magnesium oxide 400 MG tablet   Commonly known as: MAG-OX   Take 800 mg by mouth at bedtime.      metFORMIN 500 MG tablet   Commonly known as: GLUCOPHAGE   Take 1,000 mg by mouth daily with breakfast.      metoprolol succinate 25 MG 24 hr tablet   Commonly known as: TOPROL-XL   Take 25 mg by mouth every morning.      vitamin B-12 1000 MCG tablet   Commonly known as: CYANOCOBALAMIN   Take 1,000 mcg by mouth every morning.      vitamin C 500 MG tablet   Commonly known as: ASCORBIC ACID   Take 500 mg by mouth every morning.      Vitamin D (Ergocalciferol) 50000 UNITS Caps   Commonly known as: DRISDOL   Take 50,000 Units by mouth every 7 (seven) days. On thursdays        Follow-up Information    Schedule an appointment as soon as possible for a visit with [facility], MD. (As needed)    Contact information:   [address] [place] [address] [phone]       Follow up with NESI,MARC-HENRY, MD. Schedule an appointment as soon as possible for a visit in 2 weeks.   Contact information:    [address], [place] [address] [phone]           The results of significant diagnostics from this hospitalization (including imaging, microbiology, ancillary and laboratory) are listed below for reference.    Significant Diagnostic Studies: Ct Abdomen  Pelvis Wo Contrast  03/09/2012  *RADIOLOGY REPORT*  Clinical Data: Confusion; weakness.  Nausea, vomiting and diarrhea. Abdominal pain.  CT ABDOMEN AND PELVIS WITHOUT CONTRAST  Technique:  Multidetector CT imaging of the abdomen and pelvis was performed following the standard protocol without intravenous contrast.  Comparison: CT of the abdomen and pelvis performed 12/09/2009  Findings: The patient is status post median sternotomy.  There are changes of left-sided talc pleurodesis, with mild associated pleural thickening.  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  The liver and spleen are unremarkable in appearance.  The gallbladder remains distended, grossly unchanged from 2011. Scattered stones are seen layering dependently within the gallbladder.  The pancreas and adrenal glands are unremarkable.  There is significantly worsened chronic severe left-sided hydronephrosis, with marked left renal atrophy.  The proximal left ureter is markedly dilated, to the level of its passage across the left side of the aortobifemoral bypass graft.  This may reflect a focal stricture of the ureter as it passes over the stent graft.  Scattered hyperdense and hypodense cysts are seen throughout both kidneys, more prominent on the left.  Nonspecific perinephric stranding and fluid is seen bilaterally, worse on the left. No hydronephrosis is seen on the right side.  No obstructing ureteral stones are seen.  Calcifications are noted along the branches of both renal arteries.  Postoperative change is noted overlying the right  kidney, at the right renal hilum and about the mid abdominal aorta.  No free fluid is identified.  The small bowel is unremarkable in appearance.  The stomach is within normal limits.  No acute vascular abnormalities are seen.  Scattered calcification is noted along the abdominal aorta and its branches.  The patient's aortobifemoral bypass graft is grossly unremarkable, though not well assessed without contrast.  There is marked calcification involving the occluded native aortoiliac arterial branches.  The appendix is not definitely seen; there is no evidence for appendicitis.  Scattered diverticulosis is noted along the entirety of the colon, more prominent along the descending and sigmoid colon, without evidence of diverticulitis.  Contrast progresses to the level of the proximal transverse colon.  The bladder is mildly distended and grossly unremarkable.  The patient is status post hysterectomy; no suspicious adnexal masses are seen.  No inguinal lymphadenopathy is seen.  There is relatively stable focal ectasia of the right common femoral artery near the anastomosis of the bypass graft and native vessel, measuring 1.9 cm in AP dimension.  Postoperative change is noted at the left inguinal region.  No acute osseous abnormalities are identified.  There is stable chronic compression deformity of vertebral body T12.  IMPRESSION:  1.  No definite acute abnormality seen to explain the patient's symptoms. 2.  Significantly worsened chronic severe left-sided hydronephrosis, with marked left renal atrophy.  There is marked dilatation of the proximal left ureter, to the level of its passage across the left side of the aortobifemoral bypass graft.  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 3.  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 4.  Aortobifemoral bypass graft is grossly unremarkable,  though not well assessed without contrast. 5.  Scattered diverticulosis along the entirety of the colon, more prominent along the descending and sigmoid colon, without evidence of diverticulitis. 6.  Scattered hyperdense  and hypodense cysts throughout both kidneys, more prominent on the left. 7.  Changes of left-sided talc pleurodesis, with mild associated pleural thickening. 8.  Cholelithiasis noted; gallbladder otherwise unremarkable.   Original Report Authenticated By: Santa Lighter, M.D.    Dg Chest 2 View  03/08/2012  *RADIOLOGY REPORT*  Clinical Data: Weakness, emesis, diarrhea, altered mental status  CHEST - 2 VIEW  Comparison: Prior chest x-ray 01/12/2010  Findings: Interval development of a small left pleural effusion. The lungs are otherwise clear.  No overt pulmonary edema or focal airspace consolidation.  Status post median sternotomy with evidence of multivessel CABG including LIMA bypass.  Aortic atherosclerosis.  No acute osseous abnormality.  IMPRESSION:  1.  Interval development of a small left pleural effusion since the prior chest x-ray on 01/12/2010. 2.  Otherwise, no acute cardiopulmonary disease.   Original Report Authenticated By: Jacqulynn Cadet, M.D.    Ct Head Wo Contrast  03/08/2012  *RADIOLOGY REPORT*  Clinical Data: Altered mental status, weakness  CT HEAD WITHOUT CONTRAST  Technique:  Contiguous axial images were obtained from the base of the skull through the vertex without contrast.  Comparison: Prior CT head 01/11/2010  Findings: No noncontrast CT evidence of acute intracranial hemorrhage, acute infarction, mass lesion, mass effect, hydrocephalus or midline shift.  Similar degree of global cerebral volume loss commensurate with age.  Interval progression of diffuse bilateral periventricular, subcortical and deep white matter hypoattenuation.  The pattern is nonspecific but most consistent with the sequela of longstanding microvascular ischemia.  Stable mild ventriculomegaly  likely secondary to central atrophy.  Foci of hypoattenuation in the left caudate head similar to prior and favored to represent remote lacunar infarcts versus dilated perivascular spaces.  Diffuse intracranial atherosclerotic calcification of the bilateral cavernous carotid arteries and vertebral arteries in the foramen magnum.  Paranasal sinuses and mastoid air cells are well-aerated.  Globes are intact.  No focal calvarial abnormality.  IMPRESSION:  1.  No acute intracranial abnormality.  2.  Interval progression of bilateral periventricular, subcortical and deep white matter hypoattenuation compared to 01/11/2010.  The pattern is nonspecific but most consistent with the sequela of longstanding microvascular ischemia as can be seen in patients with diabetes and / or hypertension.   Original Report Authenticated By: Jacqulynn Cadet, M.D.    Dg C-arm 1-60 Min-no Report  03/10/2012  CLINICAL DATA: surgery   C-ARM 1-60 MINUTES  Fluoroscopy was utilized by the requesting physician.  No radiographic  interpretation.      Microbiology: Recent Results (from the past 240 hour(s))  URINE CULTURE     Status: Normal   Collection Time   03/08/12  8:52 PM      Component Value Range Status Comment   Specimen Description URINE, RANDOM   Final    Special Requests NONE   Final    Culture  Setup Time 03/09/2012 04:06   Final    Colony Count 40,000 COLONIES/ML   Final    Culture CITROBACTER FREUNDII   Final    Report Status 03/10/2012 FINAL   Final    Organism ID, Bacteria CITROBACTER FREUNDII   Final   SURGICAL PCR SCREEN     Status: Normal   Collection Time   03/10/12  3:35 AM      Component Value Range Status Comment   MRSA, PCR NEGATIVE  NEGATIVE Final    Staphylococcus aureus NEGATIVE  NEGATIVE Final      Labs: Basic Metabolic Panel:  Lab [id number] 0523 03/10/12 0431 03/09/12 [id number]  03/08/12 2015  NA 138 135 132* 134*  K 3.8 4.3 4.1 5.0  CL 105 104 97 100  CO2 24 24 22 21   GLUCOSE 87 112* 133* 183*    BUN 14 18 25* 27*  CREATININE 1.03 1.14* 1.35* 1.39*  CALCIUM 8.9 9.0 8.8 9.5  MG -- -- -- --  PHOS -- -- -- --   Liver Function Tests:  Lab 03/11/12 0523 03/10/12 0431 03/09/12 0337 03/08/12 2015  AST 106* 278* 322* 473*  ALT 159* 232* 262* 341*  ALKPHOS 167* 157* 127* 151*  BILITOT 0.9 1.4* 2.9* 3.1*  PROT 6.1 5.9* 6.3 7.0  ALBUMIN 2.4* 2.3* 2.6* 2.9*    Lab 03/08/12 2221  AMMONIA <10*   CBC:  Lab 03/11/12 0523 03/10/12 0431 03/09/12 0337 03/08/12 2015  WBC 5.6 4.5 8.5 13.1*  NEUTROABS -- -- -- 12.1*  HGB 11.3* 11.0* 10.4* 12.4  HCT 35.1* 33.9* 32.3* 38.5  MCV 92.1 92.1 91.0 92.8  PLT 230 217 206 249   Cardiac Enzymes:  Lab 03/09/12 1440 03/09/12 0850 03/09/12 0337  CKTOTAL -- -- --  CKMB -- -- --  CKMBINDEX -- -- --  TROPONINI <0.30 <0.30 <0.30   CBG:  Lab 03/11/12 1129 03/11/12 0723 03/10/12 2146 03/10/12 1723 03/10/12 1118  GLUCAP 114* 97 146* 126* 132*    Time coordinating discharge: 35 minutes  Signed:  Whit Bruni  Pager [phone] [facility] 03/11/2012, 2:39 PM

## 2012-03-13 ENCOUNTER — Encounter (HOSPITAL_COMMUNITY): Payer: Self-pay | Admitting: Urology

## 2012-03-13 DIAGNOSIS — D649 Anemia, unspecified: Secondary | ICD-10-CM | POA: Diagnosis not present

## 2012-03-13 DIAGNOSIS — N390 Urinary tract infection, site not specified: Secondary | ICD-10-CM | POA: Diagnosis not present

## 2012-03-13 DIAGNOSIS — I10 Essential (primary) hypertension: Secondary | ICD-10-CM | POA: Diagnosis not present

## 2012-03-13 DIAGNOSIS — E119 Type 2 diabetes mellitus without complications: Secondary | ICD-10-CM | POA: Diagnosis not present

## 2012-03-13 DIAGNOSIS — R945 Abnormal results of liver function studies: Secondary | ICD-10-CM | POA: Diagnosis not present

## 2012-03-14 ENCOUNTER — Other Ambulatory Visit (HOSPITAL_COMMUNITY): Payer: Self-pay | Admitting: Urology

## 2012-03-14 DIAGNOSIS — N1330 Unspecified hydronephrosis: Secondary | ICD-10-CM

## 2012-03-16 DIAGNOSIS — H4011X0 Primary open-angle glaucoma, stage unspecified: Secondary | ICD-10-CM | POA: Diagnosis not present

## 2012-03-16 DIAGNOSIS — H409 Unspecified glaucoma: Secondary | ICD-10-CM | POA: Diagnosis not present

## 2012-03-21 ENCOUNTER — Encounter (HOSPITAL_COMMUNITY)
Admission: RE | Admit: 2012-03-21 | Discharge: 2012-03-21 | Disposition: A | Discharge status: Home / Self Care | Payer: Medicare Other | Source: Ambulatory Visit | Attending: Urology | Admitting: Urology

## 2012-03-21 DIAGNOSIS — N2889 Other specified disorders of kidney and ureter: Secondary | ICD-10-CM | POA: Insufficient documentation

## 2012-03-21 DIAGNOSIS — I10 Essential (primary) hypertension: Secondary | ICD-10-CM | POA: Insufficient documentation

## 2012-03-21 DIAGNOSIS — E119 Type 2 diabetes mellitus without complications: Secondary | ICD-10-CM | POA: Insufficient documentation

## 2012-03-21 DIAGNOSIS — M549 Dorsalgia, unspecified: Secondary | ICD-10-CM | POA: Insufficient documentation

## 2012-03-21 DIAGNOSIS — N1330 Unspecified hydronephrosis: Secondary | ICD-10-CM | POA: Insufficient documentation

## 2012-03-21 DIAGNOSIS — I509 Heart failure, unspecified: Secondary | ICD-10-CM | POA: Diagnosis not present

## 2012-03-21 MED ORDER — TECHNETIUM TC 99M MEDRONATE IV KIT
25.0000 | PACK | Freq: Once | INTRAVENOUS | Status: AC | PRN
  Administered 2012-03-21: 25 via INTRAVENOUS

## 2012-03-21 MED ORDER — FUROSEMIDE 10 MG/ML IJ SOLN
40.0000 mg | Freq: Once | INTRAMUSCULAR | Status: DC
  Filled 2012-03-21: qty 4

## 2012-04-12 DIAGNOSIS — H4011X0 Primary open-angle glaucoma, stage unspecified: Secondary | ICD-10-CM | POA: Diagnosis not present

## 2012-04-12 DIAGNOSIS — H409 Unspecified glaucoma: Secondary | ICD-10-CM | POA: Diagnosis not present

## 2012-04-13 DIAGNOSIS — R945 Abnormal results of liver function studies: Secondary | ICD-10-CM | POA: Diagnosis not present

## 2012-04-13 DIAGNOSIS — E785 Hyperlipidemia, unspecified: Secondary | ICD-10-CM | POA: Diagnosis not present

## 2012-04-13 DIAGNOSIS — M545 Low back pain: Secondary | ICD-10-CM | POA: Diagnosis not present

## 2012-04-13 DIAGNOSIS — D649 Anemia, unspecified: Secondary | ICD-10-CM | POA: Diagnosis not present

## 2012-04-13 DIAGNOSIS — E119 Type 2 diabetes mellitus without complications: Secondary | ICD-10-CM | POA: Diagnosis not present

## 2012-05-07 ENCOUNTER — Emergency Department (HOSPITAL_COMMUNITY): Payer: Medicare Other

## 2012-05-07 ENCOUNTER — Inpatient Hospital Stay (HOSPITAL_COMMUNITY)
Admission: EM | Admit: 2012-05-07 | Discharge: 2012-05-08 | DRG: 291 | Disposition: A | Discharge status: Home / Self Care | Payer: Medicare Other | Attending: Internal Medicine | Admitting: Internal Medicine

## 2012-05-07 ENCOUNTER — Encounter (HOSPITAL_COMMUNITY): Payer: Self-pay | Admitting: Emergency Medicine

## 2012-05-07 DIAGNOSIS — E119 Type 2 diabetes mellitus without complications: Secondary | ICD-10-CM | POA: Diagnosis present

## 2012-05-07 DIAGNOSIS — E785 Hyperlipidemia, unspecified: Secondary | ICD-10-CM | POA: Diagnosis present

## 2012-05-07 DIAGNOSIS — G9340 Encephalopathy, unspecified: Secondary | ICD-10-CM

## 2012-05-07 DIAGNOSIS — Z79899 Other long term (current) drug therapy: Secondary | ICD-10-CM

## 2012-05-07 DIAGNOSIS — J189 Pneumonia, unspecified organism: Secondary | ICD-10-CM | POA: Diagnosis present

## 2012-05-07 DIAGNOSIS — R109 Unspecified abdominal pain: Secondary | ICD-10-CM

## 2012-05-07 DIAGNOSIS — Z951 Presence of aortocoronary bypass graft: Secondary | ICD-10-CM | POA: Diagnosis not present

## 2012-05-07 DIAGNOSIS — I2510 Atherosclerotic heart disease of native coronary artery without angina pectoris: Secondary | ICD-10-CM | POA: Diagnosis not present

## 2012-05-07 DIAGNOSIS — E039 Hypothyroidism, unspecified: Secondary | ICD-10-CM | POA: Diagnosis present

## 2012-05-07 DIAGNOSIS — I5043 Acute on chronic combined systolic (congestive) and diastolic (congestive) heart failure: Secondary | ICD-10-CM | POA: Diagnosis present

## 2012-05-07 DIAGNOSIS — N183 Chronic kidney disease, stage 3 unspecified: Secondary | ICD-10-CM | POA: Diagnosis present

## 2012-05-07 DIAGNOSIS — I509 Heart failure, unspecified: Secondary | ICD-10-CM | POA: Diagnosis not present

## 2012-05-07 DIAGNOSIS — I129 Hypertensive chronic kidney disease with stage 1 through stage 4 chronic kidney disease, or unspecified chronic kidney disease: Secondary | ICD-10-CM | POA: Diagnosis present

## 2012-05-07 DIAGNOSIS — I714 Abdominal aortic aneurysm, without rupture, unspecified: Secondary | ICD-10-CM | POA: Diagnosis present

## 2012-05-07 DIAGNOSIS — I2589 Other forms of chronic ischemic heart disease: Secondary | ICD-10-CM | POA: Diagnosis present

## 2012-05-07 DIAGNOSIS — R4182 Altered mental status, unspecified: Secondary | ICD-10-CM

## 2012-05-07 DIAGNOSIS — N390 Urinary tract infection, site not specified: Secondary | ICD-10-CM

## 2012-05-07 DIAGNOSIS — M1990 Unspecified osteoarthritis, unspecified site: Secondary | ICD-10-CM

## 2012-05-07 DIAGNOSIS — E875 Hyperkalemia: Secondary | ICD-10-CM | POA: Diagnosis present

## 2012-05-07 DIAGNOSIS — I10 Essential (primary) hypertension: Secondary | ICD-10-CM

## 2012-05-07 DIAGNOSIS — I739 Peripheral vascular disease, unspecified: Secondary | ICD-10-CM | POA: Diagnosis present

## 2012-05-07 DIAGNOSIS — R0602 Shortness of breath: Secondary | ICD-10-CM | POA: Diagnosis not present

## 2012-05-07 DIAGNOSIS — D649 Anemia, unspecified: Secondary | ICD-10-CM

## 2012-05-07 LAB — CBC WITH DIFFERENTIAL/PLATELET
Hemoglobin: 12.3 g/dL (ref 12.0–15.0)
Lymphocytes Relative: 11 % — ABNORMAL LOW (ref 12–46)
Lymphs Abs: 0.7 10*3/uL (ref 0.7–4.0)
MCV: 94.6 fL (ref 78.0–100.0)
Monocytes Relative: 8 % (ref 3–12)
Neutrophils Relative %: 77 % (ref 43–77)
Platelets: 199 10*3/uL (ref 150–400)
RBC: 4.11 MIL/uL (ref 3.87–5.11)
WBC: 6.2 10*3/uL (ref 4.0–10.5)

## 2012-05-07 LAB — COMPREHENSIVE METABOLIC PANEL
ALT: 11 U/L (ref 0–35)
Alkaline Phosphatase: 54 U/L (ref 39–117)
CO2: 22 mEq/L (ref 19–32)
GFR calc Af Amer: 53 mL/min — ABNORMAL LOW (ref >90)
GFR calc non Af Amer: 46 mL/min — ABNORMAL LOW (ref >90)
Glucose, Bld: 132 mg/dL — ABNORMAL HIGH (ref 70–99)
Potassium: 5.5 mEq/L — ABNORMAL HIGH (ref 3.5–5.1)
Sodium: 136 mEq/L (ref 135–145)
Total Bilirubin: 0.3 mg/dL (ref 0.3–1.2)

## 2012-05-07 LAB — URINALYSIS, ROUTINE W REFLEX MICROSCOPIC
Bilirubin Urine: NEGATIVE
Ketones, ur: NEGATIVE mg/dL
Protein, ur: 100 mg/dL — AB
Urobilinogen, UA: 0.2 mg/dL (ref 0.0–1.0)

## 2012-05-07 LAB — POCT I-STAT TROPONIN I

## 2012-05-07 LAB — TROPONIN I: Troponin I: <0.3 ng/mL (ref <0.30)

## 2012-05-07 LAB — GLUCOSE, CAPILLARY
Glucose-Capillary: 128 mg/dL — ABNORMAL HIGH (ref 70–99)
Glucose-Capillary: 140 mg/dL — ABNORMAL HIGH (ref 70–99)

## 2012-05-07 LAB — TSH: TSH: 0.489 u[IU]/mL (ref 0.350–4.500)

## 2012-05-07 LAB — URINE MICROSCOPIC-ADD ON

## 2012-05-07 LAB — CREATININE, SERUM: GFR calc Af Amer: 41 mL/min — ABNORMAL LOW (ref >90)

## 2012-05-07 MED ORDER — DEXTROSE 5 % IV SOLN
500.0000 mg | Freq: Once | INTRAVENOUS | Status: AC
  Administered 2012-05-07: 500 mg via INTRAVENOUS
  Filled 2012-05-07: qty 500

## 2012-05-07 MED ORDER — ASPIRIN 325 MG PO TABS
325.0000 mg | ORAL_TABLET | Freq: Once | ORAL | Status: AC
  Administered 2012-05-07: 325 mg via ORAL
  Filled 2012-05-07: qty 1

## 2012-05-07 MED ORDER — VITAMIN D (ERGOCALCIFEROL) 1.25 MG (50000 UNIT) PO CAPS: 50000.0000 [IU] | ORAL_CAPSULE | ORAL | Status: DC

## 2012-05-07 MED ORDER — VITAMIN B-12 1000 MCG PO TABS
1000.0000 ug | ORAL_TABLET | Freq: Every morning | ORAL | Status: DC
  Administered 2012-05-08: 1000 ug via ORAL
  Filled 2012-05-07: qty 1

## 2012-05-07 MED ORDER — VITAMIN C 500 MG PO TABS
500.0000 mg | ORAL_TABLET | Freq: Every morning | ORAL | Status: DC
  Administered 2012-05-08: 500 mg via ORAL
  Filled 2012-05-07: qty 1

## 2012-05-07 MED ORDER — MAGNESIUM OXIDE 400 (241.3 MG) MG PO TABS
800.0000 mg | ORAL_TABLET | Freq: Every day | ORAL | Status: DC
  Administered 2012-05-07: 800 mg via ORAL
  Filled 2012-05-07 (×2): qty 2

## 2012-05-07 MED ORDER — SODIUM POLYSTYRENE SULFONATE 15 GM/60ML PO SUSP
15.0000 g | Freq: Once | ORAL | Status: AC
  Administered 2012-05-07: 15 g via ORAL
  Filled 2012-05-07: qty 60

## 2012-05-07 MED ORDER — INSULIN DETEMIR 100 UNIT/ML ~~LOC~~ SOLN
16.0000 [IU] | Freq: Every day | SUBCUTANEOUS | Status: DC
  Administered 2012-05-07: 16 [IU] via SUBCUTANEOUS
  Filled 2012-05-07: qty 10

## 2012-05-07 MED ORDER — ASPIRIN 81 MG PO CHEW
81.0000 mg | CHEWABLE_TABLET | Freq: Every day | ORAL | Status: DC
  Administered 2012-05-07: 81 mg via ORAL
  Filled 2012-05-07 (×2): qty 1

## 2012-05-07 MED ORDER — NITROGLYCERIN 0.4 MG SL SUBL: 0.4000 mg | SUBLINGUAL_TABLET | SUBLINGUAL | Status: DC | PRN

## 2012-05-07 MED ORDER — ONDANSETRON HCL 4 MG PO TABS: 4.0000 mg | ORAL_TABLET | Freq: Four times a day (QID) | ORAL | Status: DC | PRN

## 2012-05-07 MED ORDER — NITROGLYCERIN 2 % TD OINT
1.0000 [in_us] | TOPICAL_OINTMENT | Freq: Once | TRANSDERMAL | Status: AC
  Administered 2012-05-07: 1 [in_us] via TOPICAL
  Filled 2012-05-07: qty 30

## 2012-05-07 MED ORDER — FERROUS SULFATE 325 (65 FE) MG PO TABS
325.0000 mg | ORAL_TABLET | Freq: Every day | ORAL | Status: DC
  Administered 2012-05-07: 325 mg via ORAL
  Filled 2012-05-07 (×2): qty 1

## 2012-05-07 MED ORDER — CLOPIDOGREL BISULFATE 75 MG PO TABS
75.0000 mg | ORAL_TABLET | Freq: Every morning | ORAL | Status: DC
  Administered 2012-05-08: 75 mg via ORAL
  Filled 2012-05-07: qty 1

## 2012-05-07 MED ORDER — FUROSEMIDE 10 MG/ML IJ SOLN
40.0000 mg | Freq: Two times a day (BID) | INTRAMUSCULAR | Status: DC
  Administered 2012-05-07: 40 mg via INTRAVENOUS
  Filled 2012-05-07 (×2): qty 4

## 2012-05-07 MED ORDER — HEPARIN SODIUM (PORCINE) 5000 UNIT/ML IJ SOLN
5000.0000 [IU] | Freq: Three times a day (TID) | INTRAMUSCULAR | Status: DC
  Administered 2012-05-07 – 2012-05-08 (×2): 5000 [IU] via SUBCUTANEOUS
  Filled 2012-05-07 (×5): qty 1

## 2012-05-07 MED ORDER — LEVOTHYROXINE SODIUM 137 MCG PO TABS
137.0000 ug | ORAL_TABLET | Freq: Every day | ORAL | Status: DC
  Administered 2012-05-08: 137 ug via ORAL
  Filled 2012-05-07 (×2): qty 1

## 2012-05-07 MED ORDER — CALCIUM CARBONATE-VITAMIN D 500-200 MG-UNIT PO TABS
1.0000 | ORAL_TABLET | Freq: Every morning | ORAL | Status: DC
  Administered 2012-05-08: 1 via ORAL
  Filled 2012-05-07: qty 1

## 2012-05-07 MED ORDER — SODIUM CHLORIDE 0.9 % IJ SOLN
3.0000 mL | Freq: Two times a day (BID) | INTRAMUSCULAR | Status: DC
  Administered 2012-05-08: 3 mL via INTRAVENOUS

## 2012-05-07 MED ORDER — FUROSEMIDE 10 MG/ML IJ SOLN
40.0000 mg | Freq: Once | INTRAMUSCULAR | Status: AC
Start: 2012-05-07 — End: 2012-05-07
  Administered 2012-05-07: 40 mg via INTRAVENOUS
  Filled 2012-05-07: qty 4

## 2012-05-07 MED ORDER — GUAIFENESIN-DM 100-10 MG/5ML PO SYRP: 5.0000 mL | ORAL_SOLUTION | ORAL | Status: DC | PRN

## 2012-05-07 MED ORDER — NITROGLYCERIN 2 % TD OINT
1.0000 [in_us] | TOPICAL_OINTMENT | Freq: Three times a day (TID) | TRANSDERMAL | Status: DC
  Administered 2012-05-07 – 2012-05-08 (×2): 1 [in_us] via TOPICAL
  Filled 2012-05-07: qty 30

## 2012-05-07 MED ORDER — INSULIN ASPART 100 UNIT/ML ~~LOC~~ SOLN
0.0000 [IU] | Freq: Three times a day (TID) | SUBCUTANEOUS | Status: DC
  Administered 2012-05-07: 2 [IU] via SUBCUTANEOUS

## 2012-05-07 MED ORDER — DEXTROSE 5 % IV SOLN
1.0000 g | Freq: Once | INTRAVENOUS | Status: AC
  Administered 2012-05-07: 1 g via INTRAVENOUS
  Filled 2012-05-07: qty 10

## 2012-05-07 MED ORDER — MAGNESIUM OXIDE 400 MG PO TABS
800.0000 mg | ORAL_TABLET | Freq: Every day | ORAL | Status: DC
Start: 2012-05-07 — End: 2012-05-07

## 2012-05-07 MED ORDER — ONDANSETRON HCL 4 MG/2ML IJ SOLN: 4.0000 mg | Freq: Four times a day (QID) | INTRAMUSCULAR | Status: DC | PRN

## 2012-05-07 MED ORDER — ALUM & MAG HYDROXIDE-SIMETH 200-200-20 MG/5ML PO SUSP: 30.0000 mL | Freq: Four times a day (QID) | ORAL | Status: DC | PRN

## 2012-05-07 MED ORDER — HYDROCODONE-ACETAMINOPHEN 5-325 MG PO TABS
1.0000 | ORAL_TABLET | ORAL | Status: DC | PRN
  Administered 2012-05-08 (×2): 2 via ORAL
  Filled 2012-05-07 (×2): qty 2

## 2012-05-07 MED ORDER — LEVOFLOXACIN IN D5W 750 MG/150ML IV SOLN
750.0000 mg | INTRAVENOUS | Status: DC
  Administered 2012-05-07: 750 mg via INTRAVENOUS
  Filled 2012-05-07: qty 150

## 2012-05-07 MED ORDER — HYDRALAZINE HCL 20 MG/ML IJ SOLN
10.0000 mg | Freq: Four times a day (QID) | INTRAMUSCULAR | Status: DC | PRN
  Administered 2012-05-07: 10 mg via INTRAVENOUS
  Filled 2012-05-07: qty 1

## 2012-05-07 MED ORDER — POLYETHYLENE GLYCOL 3350 17 G PO PACK
17.0000 g | PACK | Freq: Every day | ORAL | Status: DC | PRN
  Filled 2012-05-07: qty 1

## 2012-05-07 MED ORDER — METOPROLOL SUCCINATE ER 25 MG PO TB24
25.0000 mg | ORAL_TABLET | Freq: Every morning | ORAL | Status: DC
  Administered 2012-05-08: 25 mg via ORAL
  Filled 2012-05-07: qty 1

## 2012-05-07 MED ORDER — ALBUTEROL SULFATE (5 MG/ML) 0.5% IN NEBU: 2.5000 mg | INHALATION_SOLUTION | RESPIRATORY_TRACT | Status: DC | PRN

## 2012-05-07 MED ORDER — DOCUSATE SODIUM 100 MG PO CAPS
100.0000 mg | ORAL_CAPSULE | Freq: Two times a day (BID) | ORAL | Status: DC
  Administered 2012-05-07 – 2012-05-08 (×2): 100 mg via ORAL
  Filled 2012-05-07 (×3): qty 1

## 2012-05-07 NOTE — Progress Notes (Signed)
ANTIBIOTIC CONSULT NOTE - INITIAL  Pharmacy Consult for Levaquin Indication: CAP  No Active Allergies  Patient Measurements: Height: 5\' 6"  (167.6 cm) Weight: 171 lb 1.6 oz (77.61 kg) IBW/kg (Calculated) : 59.3  Vital Signs: Temp: 97.5 F (36.4 C) (03/02 1630) Temp src: Oral (03/02 1630) BP: 187/55 mmHg (03/02 1630) Pulse Rate: 49 (03/02 1630) Intake/Output from previous day:   Intake/Output from this shift: Total I/O In: -  Out: 520 [Urine:520]  Labs:  Recent Labs  05/07/12 1224  WBC 6.2  HGB 12.3  PLT 199  CREATININE 1.17*   Estimated Creatinine Clearance: 46.4 ml/min (by C-G formula based on Cr of 1.17). No results found for this basename: VANCOTROUGH, VANCOPEAK, VANCORANDOM, GENTTROUGH, GENTPEAK, GENTRANDOM, TOBRATROUGH, TOBRAPEAK, TOBRARND, AMIKACINPEAK, AMIKACINTROU, AMIKACIN,  in the last 72 hours   Microbiology: No results found for this or any previous visit (from the past 720 hour(s)).  Medical History: Past Medical History  Diagnosis Date  . Diabetes mellitus without complication   . CHF (congestive heart failure)   . Coronary artery disease   . AAA (abdominal aortic aneurysm) 03/09/2012  . PVD (peripheral vascular disease)   . Carotid artery stenosis   . Hypertension   . Hyperlipidemia   . Hypothyroidism     Medications:  Anti-infectives   Start     Dose/Rate Route Frequency Ordered Stop   05/07/12 1500  cefTRIAXone (ROCEPHIN) 1 g in dextrose 5 % 50 mL IVPB     1 g 100 mL/hr over 30 Minutes Intravenous  Once 05/07/12 1445     05/07/12 1500  azithromycin (ZITHROMAX) 500 mg in dextrose 5 % 250 mL IVPB     500 mg 250 mL/hr over 60 Minutes Intravenous  Once 05/07/12 1445 05/07/12 1624     Assessment: 72 yo F admitted 05/07/12 with SOB due to acute on chronic diastolic and systolic CHF. Per MD note, CXR shows possible PNA, but no clinical evidence of PNA. Plan is to start Levaquin per Rx and stop abx if clinically remains afebrile w/o leukocytosis.  CrCl ~ 46 ml/min.  Plan:  Levaquin 750mg  IV q48h for CrCl<54ml/min  Verdia Kuba, PharmD Pager: [phone] 05/07/2012,4:50 PM

## 2012-05-07 NOTE — ED Provider Notes (Signed)
History     CSN: [id number]  Arrival date & time 05/07/12  1106   First MD Initiated Contact with Patient 05/07/12 1142      Chief Complaint  Patient presents with  . Shortness of Breath    (Consider location/radiation/quality/duration/timing/severity/associated sxs/prior treatment) HPI 72 year old female with history of coronary artery disease, CABG, heart failure, has 2 days of gradual onset exertional shortness of breath gradually worsening with activity with no chest pain no cough no bowel pain no fever no vomiting no bloody stools no focal weakness or numbness just generalized weakness the last couple days with exertion she has no edema no known weight gain but does not use a scale there is no treatment prior to arrival, she is moderately short of breath today walking less than 20 feet which is very unusual for her. Past Medical History  Diagnosis Date  . Diabetes mellitus without complication   . CHF (congestive heart failure)   . Coronary artery disease   . AAA (abdominal aortic aneurysm) 03/09/2012  . PVD (peripheral vascular disease)   . Carotid artery stenosis   . Hypertension   . Hyperlipidemia   . Hypothyroidism     Past Surgical History  Procedure Laterality Date  . Cardiac surgery    . Abdominal hysterectomy    . Tumor removal    . Coronary stent placement    . Femoral artery repair    . Cystoscopy w/ ureteral stent placement  03/10/2012    Procedure: CYSTOSCOPY WITH RETROGRADE PYELOGRAM/URETERAL STENT PLACEMENT;  Surgeon: Hanley Ben, MD;  Location: [facility];  Service: Urology;  Laterality: Left;    No family history on file.  History  Substance Use Topics  . Smoking status: Former Research scientist (life sciences)  . Smokeless tobacco: Never Used  . Alcohol Use: No    OB History   Grav Para Term Preterm Abortions TAB SAB Ect Mult Living                  Review of Systems 10 Systems reviewed and are negative for acute change except as noted in the HPI. Allergies  Review of  patient's allergies indicates no active allergies.  Home Medications   Current Outpatient Rx  Name  Route  Sig  Dispense  Refill  . aspirin 81 MG chewable tablet   Oral   Chew 81 mg by mouth every evening.          . calcium-vitamin D (OSCAL WITH D) 500-200 MG-UNIT per tablet   Oral   Take 1 tablet by mouth every morning.         . clopidogrel (PLAVIX) 75 MG tablet   Oral   Take 75 mg by mouth every morning.          . docusate sodium 100 MG CAPS   Oral   Take 100 mg by mouth 2 (two) times daily.   60 capsule   3   . ferrous sulfate 325 (65 FE) MG tablet   Oral   Take 325 mg by mouth at bedtime.          Marland Kitchen HYDROcodone-acetaminophen (VICODIN) 5-500 MG per tablet   Oral   Take 2 tablets by mouth at bedtime.         . insulin detemir (LEVEMIR) 100 UNIT/ML injection   Subcutaneous   Inject 16 Units into the skin at bedtime.         Marland Kitchen levothyroxine (SYNTHROID, LEVOTHROID) 137 MCG tablet   Oral   Take  137 mcg by mouth daily before breakfast.         . losartan (COZAAR) 25 MG tablet   Oral   Take 25 mg by mouth every morning.          . magnesium oxide (MAG-OX) 400 MG tablet   Oral   Take 800 mg by mouth at bedtime.          . metFORMIN (GLUCOPHAGE) 500 MG tablet   Oral   Take 1,000 mg by mouth at bedtime.          . metoprolol succinate (TOPROL-XL) 25 MG 24 hr tablet   Oral   Take 25 mg by mouth every morning.          . vitamin B-12 (CYANOCOBALAMIN) 1000 MCG tablet   Oral   Take 1,000 mcg by mouth every morning.         . vitamin C (ASCORBIC ACID) 500 MG tablet   Oral   Take 500 mg by mouth every morning.         . Vitamin D, Ergocalciferol, (DRISDOL) 50000 UNITS CAPS   Oral   Take 50,000 Units by mouth every 7 (seven) days. On thursdays         . furosemide (LASIX) 20 MG tablet   Oral   Take 1 tablet (20 mg total) by mouth daily.   30 tablet   0   . levofloxacin (LEVAQUIN) 500 MG tablet   Oral   Take 1 tablet (500 mg  total) by mouth every other day.   3 tablet   0     BP 130/63  Pulse 56  Temp(Src) 97.3 F (36.3 C) (Oral)  Resp 20  Ht 5\' 6"  (1.676 m)  Wt 167 lb 1.7 oz (75.8 kg)  BMI 26.98 kg/m2  SpO2 96%  Physical Exam  Nursing note and vitals reviewed. Constitutional:  Awake, alert, nontoxic appearance.  HENT:  Head: Atraumatic.  Eyes: Right eye exhibits no discharge. Left eye exhibits no discharge.  Neck: Neck supple.  Cardiovascular: Normal rate and regular rhythm.   No murmur heard. Pulmonary/Chest: She is in respiratory distress. She has no wheezes. She has rales. She exhibits no tenderness.  72 year old female is in mild respiratory distress at rest with hypoxemia on room air at 89% improving to 96% with a significant oxygen, she is bilateral crackles approximately halfway up on the left and approximately 1/4 the way up in the right inner posterior lung fields with no wheezing no rhonchi no retractions no accessory muscle usage she is able to speak sentences at rest but with some mild distress and she states she is moderate respiratory distress with minimal exertion in the ED when she walks less than 20 feet  Abdominal: Soft. Bowel sounds are normal. She exhibits no distension and no mass. There is no tenderness. There is no rebound and no guarding.  Musculoskeletal: She exhibits no edema and no tenderness.  Baseline ROM, no obvious new focal weakness.  Neurological: She is alert.  Mental status and motor strength appears baseline for patient and situation.  Skin: No rash noted.  Psychiatric: She has a normal mood and affect.    ED Course  Procedures (including critical care time) ECG: Sinus rhythm, ventricular 58, normal axis, inferolateral ST depression, no significant change noted compared with January 2014   Clinically suspect HF and feel CAP low likelihood, but will start antibiotics due to Rads reading.  Labs Reviewed  CBC WITH DIFFERENTIAL - Abnormal; Notable  for the  following:    Lymphocytes Relative 11 (*)    All other components within normal limits  COMPREHENSIVE METABOLIC PANEL - Abnormal; Notable for the following:    Potassium 5.5 (*)    Glucose, Bld 132 (*)    BUN 24 (*)    Creatinine, Ser 1.17 (*)    Albumin 3.1 (*)    GFR calc non Af Amer 46 (*)    GFR calc Af Amer 53 (*)    All other components within normal limits  PRO B NATRIURETIC PEPTIDE - Abnormal; Notable for the following:    Pro B Natriuretic peptide (BNP) 5911.0 (*)    All other components within normal limits  URINALYSIS, ROUTINE W REFLEX MICROSCOPIC - Abnormal; Notable for the following:    APPearance CLOUDY (*)    Hgb urine dipstick MODERATE (*)    Protein, ur 100 (*)    Leukocytes, UA SMALL (*)    All other components within normal limits  URINE MICROSCOPIC-ADD ON - Abnormal; Notable for the following:    Squamous Epithelial / LPF MANY (*)    Bacteria, UA FEW (*)    All other components within normal limits  GLUCOSE, CAPILLARY - Abnormal; Notable for the following:    Glucose-Capillary 128 (*)    All other components within normal limits  HEMOGLOBIN A1C - Abnormal; Notable for the following:    Hemoglobin A1C 6.0 (*)    Mean Plasma Glucose 126 (*)    All other components within normal limits  CREATININE, SERUM - Abnormal; Notable for the following:    Creatinine, Ser 1.44 (*)    GFR calc non Af Amer 36 (*)    GFR calc Af Amer 41 (*)    All other components within normal limits  GLUCOSE, CAPILLARY - Abnormal; Notable for the following:    Glucose-Capillary 169 (*)    All other components within normal limits  BASIC METABOLIC PANEL - Abnormal; Notable for the following:    Glucose, Bld 133 (*)    BUN 32 (*)    Creatinine, Ser 1.50 (*)    GFR calc non Af Amer 34 (*)    GFR calc Af Amer 39 (*)    All other components within normal limits  GLUCOSE, CAPILLARY - Abnormal; Notable for the following:    Glucose-Capillary 140 (*)    All other components within normal  limits  GLUCOSE, CAPILLARY - Abnormal; Notable for the following:    Glucose-Capillary 118 (*)    All other components within normal limits  URINE CULTURE  CULTURE, EXPECTORATED SPUTUM-ASSESSMENT  T3, FREE  TSH  PROTIME-INR  TROPONIN I  TROPONIN I  TROPONIN I  CBC  MAGNESIUM  POCT I-STAT TROPONIN I   Dg Chest 2 View  05/07/2012  *RADIOLOGY REPORT*  Clinical Data: Shortness of breath  CHEST - 2 VIEW  Comparison: 03/08/2002.  Findings: Increasing left lower lobe opacity, suspicious for pneumonia.  Underlying pleural calcifications and basilar scarring.  No definite pleural effusion.  No pneumothorax.  The heart normal in size. Postsurgical changes related to prior CABG.  Visualized osseous structures are within normal limits.  IMPRESSION: Increasing left lower lobe opacity, suspicious for pneumonia.  Underlying pleural calcifications and basilar scarring.   Original Report Authenticated By: Julian Hy, M.D.      1. Heart failure   2. CAP (community acquired pneumonia)   3. AAA (abdominal aortic aneurysm)   4. Acute encephalopathy   5. CAD (coronary artery disease)  6. CHF (congestive heart failure)   7. Diabetes mellitus, type II   8. HTN (hypertension)   9. Abdominal pain   10. Altered mental status   11. Arthritis   12. Normocytic anemia   13. Elevated LFTs   14. UTI (lower urinary tract infection)       MDM  Pt stable in ED with no significant deterioration in condition, actually feels better but not well enough for home.  Patient / Family / Caregiver informed of clinical course, understand medical decision-making process, and agree with plan.  I doubt any other EMC precluding discharge at this time including, but not necessarily limited to the following:ACS, sepsis.        Babette Relic, MD 05/08/12 [phone]

## 2012-05-07 NOTE — H&P (Signed)
[facility]                                                                                    Patient Demographics  [name] [name], is a 72 y.o. female  CSN: [id number]  MRN: [id number]  DOB - 05-05-1940  Admit Date - 05/07/2012  Outpatient Primary MD for the patient is [name], MD   With History of -  Past Medical History  Diagnosis Date  . Diabetes mellitus without complication   . CHF (congestive heart failure)   . Coronary artery disease   . AAA (abdominal aortic aneurysm) 03/09/2012  . PVD (peripheral vascular disease)   . Carotid artery stenosis   . Hypertension   . Hyperlipidemia   . Hypothyroidism       Past Surgical History  Procedure Laterality Date  . Cardiac surgery    . Abdominal hysterectomy    . Tumor removal    . Coronary stent placement    . Femoral artery repair    . Cystoscopy w/ ureteral stent placement  03/10/2012    Procedure: CYSTOSCOPY WITH RETROGRADE PYELOGRAM/URETERAL STENT PLACEMENT;  Surgeon: Hanley Ben, MD;  Location: [facility];  Service: Urology;  Laterality: Left;    in for   Chief Complaint  Patient presents with  . Shortness of Breath     HPI  [name] [name]  is a 72 y.o. 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 In the ER workup was suggested for acute on chronic CHF, hyperkalemia and  I was called to admit the patient    Review of Systems    In addition to the HPI above,  No Fever-chills, No Headache, No changes with Vision or hearing, No problems swallowing food or Liquids, No Chest pain, ++ Cough &  Shortness of Breath, No Abdominal pain, No Nausea or Vommitting, Bowel movements are regular, No Blood in stool or Urine, No dysuria, No new skin rashes or bruises, No new joints pains-aches,  No new weakness, tingling, numbness in any extremity, No recent weight gain or loss, No polyuria, polydypsia or polyphagia, No significant Mental Stressors.  A full 10 point Review of Systems was done, except as stated above, all other Review of Systems were negative.   Social History History  Substance Use Topics  . Smoking status: Former Research scientist (life sciences)  . Smokeless tobacco: Never Used  . Alcohol Use: No      Family History DM-2  Prior to Admission medications   Medication Sig Start Date End Date Taking? Authorizing Provider  aspirin 81 MG chewable tablet Chew 81 mg by  mouth every evening.    Yes Historical Provider, MD  calcium-vitamin D (OSCAL WITH D) 500-200 MG-UNIT per tablet Take 1 tablet by mouth every morning.   Yes Historical Provider, MD  clopidogrel (PLAVIX) 75 MG tablet Take 75 mg by mouth every morning.    Yes Historical Provider, MD  docusate sodium 100 MG CAPS Take 100 mg by mouth 2 (two) times daily. 03/11/12  Yes Christina P Rama, MD  ferrous sulfate 325 (65 FE) MG tablet Take 325 mg by mouth at bedtime.    Yes Historical Provider, MD  HYDROcodone-acetaminophen (VICODIN) 5-500 MG per tablet Take 2 tablets by mouth at bedtime.   Yes Historical Provider, MD  insulin detemir (LEVEMIR) 100 UNIT/ML injection Inject 16 Units into the skin at bedtime.   Yes Historical Provider, MD  levothyroxine (SYNTHROID, LEVOTHROID) 137 MCG tablet Take 137 mcg by mouth daily before breakfast.   Yes Historical Provider, MD  losartan (COZAAR) 25 MG tablet Take 25 mg by mouth every  morning.    Yes Historical Provider, MD  magnesium oxide (MAG-OX) 400 MG tablet Take 800 mg by mouth at bedtime.    Yes Historical Provider, MD  metFORMIN (GLUCOPHAGE) 500 MG tablet Take 1,000 mg by mouth at bedtime.    Yes Historical Provider, MD  metoprolol succinate (TOPROL-XL) 25 MG 24 hr tablet Take 25 mg by mouth every morning.    Yes Historical Provider, MD  vitamin B-12 (CYANOCOBALAMIN) 1000 MCG tablet Take 1,000 mcg by mouth every morning.   Yes Historical Provider, MD  vitamin C (ASCORBIC ACID) 500 MG tablet Take 500 mg by mouth every morning.   Yes Historical Provider, MD  Vitamin D, Ergocalciferol, (DRISDOL) 50000 UNITS CAPS Take 50,000 Units by mouth every 7 (seven) days. On thursdays   Yes Historical Provider, MD    Allergies  Allergen Reactions  . Novolog (Insulin Aspart) Rash    Physical Exam  Vitals  Blood pressure 168/59, pulse 46, temperature 97.7 F (36.5 C), temperature source Oral, resp. rate 23, SpO2 100.00%.   1. General elderly white female lying in bed in NAD,     2. Normal affect and insight, Not Suicidal or Homicidal, Awake Alert, Oriented X 3.  3. No F.N deficits, ALL C.Nerves Intact, Strength 5/5 all 4 extremities, Sensation intact all 4 extremities, Plantars down going.  4. Ears and Eyes appear Normal, Conjunctivae clear, PERRLA. Moist Oral Mucosa.  5. Supple Neck, + JVD, No cervical lymphadenopathy appriciated, No Carotid Bruits.  6. Symmetrical Chest wall movement, Good air movement bilaterally, ++ rales  7. RRR, No Gallops, Rubs or Murmurs, No Parasternal Heave.  8. Positive Bowel Sounds, Abdomen Soft, Non tender, No organomegaly appriciated,No rebound -guarding or rigidity.  9.  No Cyanosis, Normal Skin Turgor, No Skin Rash or Bruise.  10. Good muscle tone,  joints appear normal , no effusions, Normal ROM.  11. No Palpable Lymph Nodes in Neck or Axillae     Data Review  CBC  Recent Labs Lab 05/07/12 1224  WBC 6.2  HGB 12.3  HCT  38.9  PLT 199  MCV 94.6  MCH 29.9  MCHC 31.6  RDW 14.4  LYMPHSABS 0.7  MONOABS 0.5  EOSABS 0.2  BASOSABS 0.0   ------------------------------------------------------------------------------------------------------------------  Chemistries   Recent Labs Lab 05/07/12 1224  NA 136  K 5.5*  CL 105  CO2 22  GLUCOSE 132*  BUN 24*  CREATININE 1.17*  CALCIUM 9.2  AST 17  ALT 11  ALKPHOS 54  BILITOT  0.3   ------------------------------------------------------------------------------------------------------------------ CrCl is unknown because both a height and weight (above a minimum accepted value) are required for this calculation. ------------------------------------------------------------------------------------------------------------------ No results found for this basename: TSH, T4TOTAL, FREET3, T3FREE, THYROIDAB,  in the last 72 hours   Coagulation profile No results found for this basename: INR, PROTIME,  in the last 168 hours ------------------------------------------------------------------------------------------------------------------- No results found for this basename: DDIMER,  in the last 72 hours -------------------------------------------------------------------------------------------------------------------  Cardiac Enzymes No results found for this basename: CK, CKMB, TROPONINI, MYOGLOBIN,  in the last 168 hours ------------------------------------------------------------------------------------------------------------------ No components found with this basename: POCBNP,    ---------------------------------------------------------------------------------------------------------------  Urinalysis    Component Value Date/Time   COLORURINE YELLOW 05/07/2012 1243   APPEARANCEUR CLOUDY* 05/07/2012 1243   LABSPEC 1.014 05/07/2012 1243   PHURINE 6.5 05/07/2012 [place] 05/07/2012 1243   HGBUR MODERATE* 05/07/2012 [place]  05/07/2012 [place] 05/07/2012 1243   PROTEINUR 100* 05/07/2012 1243   UROBILINOGEN 0.2 05/07/2012 1243   NITRITE NEGATIVE 05/07/2012 [place]* 05/07/2012 1243    ----------------------------------------------------------------------------------------------------------------  Imaging results:   Dg Chest 2 View  05/07/2012  *RADIOLOGY REPORT*  Clinical Data: Shortness of breath  CHEST - 2 VIEW  Comparison: 03/08/2002.  Findings: Increasing left lower lobe opacity, suspicious for pneumonia.  Underlying pleural calcifications and basilar scarring.  No definite pleural effusion.  No pneumothorax.  The heart normal in size. Postsurgical changes related to prior CABG.  Visualized osseous structures are within normal limits.  IMPRESSION: Increasing left lower lobe opacity, suspicious for pneumonia.  Underlying pleural calcifications and basilar scarring.   Original Report Authenticated By: Julian Hy, M.D.     My personal review of EKG: Rhythm S.Bardy, Rate  48 /min, chronic lateral flipped T waves   Echo 2011 - EF 0000000, grade 1 diastolic   Assessment & Plan     1. Shortness of breath due to acute on chronic combined diastolic and systolic CHF, EF 0000000 and [id number]. Secondary to dietary noncompliance, patient will be admitted to telemetry unit, nitro paste, beta blockers if tolerated by heart rate, IV Lasix, fluid and sodium restriction, elevate head of the bed, oxygen and nebulizer as needed. Will cycle troponin. Will have [facility] cardiology see the patient. He was counseled on diet and fluid restriction. For now ACE/ARB will be held due to high potassium.   2. Chronic kidney disease stage III baseline creatinine between 1.2 and 1.3, no acute issues monitor.    3. Diabetes mellitus type 2. Patient on Levemir which will be continued, will hold Glucophage, low-dose sliding scale insulin, check A1c.    4. Hypothyroidism. Check TSH and free T3, home dose  Synthroid to be continued.   5.CAD status post CABG, nuclear stress test in 2011 negative. No acute issues. Aspirin Plavix will be continued along with beta blocker and statin, cardiology to see.     6. Hyperkalemia. Mild, hold ACE/ARB, IV Lasix, low-dose Kayexalate and repeat potassium in the morning.    7. Possible infiltrate on chest x-ray. No clinical evidence of pneumonia other than x-ray finding, sputum cultures she produces phlegm, blood cultures if she is febrile, single agent for now Levaquin , will stop it [place] if clinically remains afebrile without leukocytosis. Outpatient chest x-ray two-view to be repeated in 2 weeks.      DVT Prophylaxis Heparin    AM Labs Ordered, also please review Full Orders  Family Communication: Admission, patients condition and plan  of care including tests being ordered have been discussed with the patient who indicates  understanding and agree with the plan and Code Status.  Code Status Full  Likely DC to TBD  Time spent in minutes : 35  Condition GUARDED   Thurnell Lose M.D on 05/07/2012 at 3:02 PM  Between 7am to 7pm - Pager - [phone]  After 7pm go to [email or web address] - password [email or web address]  And look for the night coverage person covering me after hours  [facility] Office  [phone]

## 2012-05-07 NOTE — ED Notes (Addendum)
Pt reports shortness of breath that started last night around 2100. Pt denies chest pain, nausea, or vomiting. Crackles heard upon ascultation of breath sounds. Pt reports feeling increased shortness of breath while doing activities. Pt has cardiac history of quadruple bypass surgery.

## 2012-05-08 DIAGNOSIS — R4182 Altered mental status, unspecified: Secondary | ICD-10-CM

## 2012-05-08 DIAGNOSIS — R109 Unspecified abdominal pain: Secondary | ICD-10-CM

## 2012-05-08 DIAGNOSIS — G9340 Encephalopathy, unspecified: Secondary | ICD-10-CM | POA: Diagnosis not present

## 2012-05-08 DIAGNOSIS — I509 Heart failure, unspecified: Secondary | ICD-10-CM | POA: Diagnosis not present

## 2012-05-08 DIAGNOSIS — J189 Pneumonia, unspecified organism: Secondary | ICD-10-CM

## 2012-05-08 LAB — BASIC METABOLIC PANEL
GFR calc Af Amer: 39 mL/min — ABNORMAL LOW (ref >90)
GFR calc non Af Amer: 34 mL/min — ABNORMAL LOW (ref >90)
Glucose, Bld: 133 mg/dL — ABNORMAL HIGH (ref 70–99)
Potassium: 4.5 mEq/L (ref 3.5–5.1)
Sodium: 136 mEq/L (ref 135–145)

## 2012-05-08 LAB — HEMOGLOBIN A1C
Hgb A1c MFr Bld: 6 % — ABNORMAL HIGH (ref <5.7)
Mean Plasma Glucose: 126 mg/dL — ABNORMAL HIGH (ref <117)

## 2012-05-08 LAB — URINE CULTURE

## 2012-05-08 LAB — MAGNESIUM: Magnesium: 1.7 mg/dL (ref 1.5–2.5)

## 2012-05-08 LAB — CBC
Hemoglobin: 12 g/dL (ref 12.0–15.0)
RBC: 4.01 MIL/uL (ref 3.87–5.11)
WBC: 5.2 10*3/uL (ref 4.0–10.5)

## 2012-05-08 LAB — GLUCOSE, CAPILLARY: Glucose-Capillary: 118 mg/dL — ABNORMAL HIGH (ref 70–99)

## 2012-05-08 LAB — TROPONIN I: Troponin I: <0.3 ng/mL (ref <0.30)

## 2012-05-08 MED ORDER — LEVOFLOXACIN 500 MG PO TABS: 500.0000 mg | ORAL_TABLET | ORAL | Status: DC

## 2012-05-08 MED ORDER — FUROSEMIDE 20 MG PO TABS: 20.0000 mg | ORAL_TABLET | Freq: Every day | ORAL | Status: DC

## 2012-05-08 MED ORDER — LEVOFLOXACIN 500 MG PO TABS: 500.0000 mg | ORAL_TABLET | Freq: Every day | ORAL | Status: DC

## 2012-05-08 MED ORDER — FUROSEMIDE 10 MG/ML IJ SOLN
20.0000 mg | Freq: Two times a day (BID) | INTRAMUSCULAR | Status: DC
  Filled 2012-05-08: qty 2

## 2012-05-08 NOTE — Discharge Summary (Signed)
Physician Discharge Summary  [name] [name] [id number] DOB: Jul 26, 1940 DOA: 05/07/2012  PCP: [name], MD  Admit date: 05/07/2012 Discharge date: 05/08/2012  Recommendations for Outpatient Follow-up:  1. Follow up with primary care in 1 week for weight check and additional counseling on diet.  Follow up urine culture which is pending a the time of discharge.  BMP at follow up appointment.   Discharge Diagnoses:  Principal Problem:   CHF (congestive heart failure) Active Problems:   CAD (coronary artery disease)   Diabetes mellitus, type II   HTN (hypertension)   AAA (abdominal aortic aneurysm)   Discharge Condition: stable, improved to baseline  Diet recommendation: 2gm sodium, 1.5L fluid restriction  Wt Readings from Last 3 Encounters:  05/08/12 75.8 kg (167 lb 1.7 oz)  03/09/12 79.017 kg (174 lb 3.2 oz)  03/09/12 79.017 kg (174 lb 3.2 oz)    History of present illness:   [name] [name] is a 72 y.o. 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 In the ER workup was suggested for acute on chronic CHF, hyperkalemia and I was called to admit the patient.   Hospital Course:   [name] [name] was admitted to telemetry.  She had stable ECG and troponins were negative x 3.  Her TSH was 0.489 and free T3 2.4.  She was given antibiotics for community acquired  pneumonia and diuresed with lasix 40mg  IV once.  Her breathing rapidly improved with lasix and she was able to ambulate the halls without shortness of breath or cough by the following morning.  She was advised to eat a low sodium diet, continue lasix 20mg  daily at home, complete a short course of antibiotics for CAP and follow up with her primary care doctor within 1 week. Most likely, her symptoms were related to dietary noncompliance related exacerbation, however, it's also possible she had an exacerbation secondary to an underlying pneumonia so will treat for both.    For her systolic CHF, she is on beta blocker.  I will restart her ARB because her potassium has normalized and I am starting lasix for a few days, particularly given the severity of her CHF, and she should have a repeat BMP at her follow up appointment.  Consider spironolactone in the future if potassium levels remain normal.      Procedures:  none  Consultations:  none  Discharge Exam: Filed Vitals:   05/08/12 0518  BP: 130/63  Pulse: 56  Temp: 97.3 F (36.3 C)  Resp: 20   Filed Vitals:   05/07/12 1957 05/07/12 2140 05/08/12 0518 05/08/12 0531  BP: 110/48 147/52 130/63   Pulse:  62 56   Temp:  97.3 F (36.3 C) 97.3 F (36.3 C)   TempSrc:  Oral Oral   Resp:  20 20   Height:      Weight:    75.8 kg (167 lb 1.7 oz)  SpO2:  98% 96%  General: Caucasian female, no acute distress, sitting up in bed HEENT:  MMM, no elevation of JVP Cardiovascular:  Slightly bradycardic, regular rate, normal S1, S2, 2/6 murmur at the LSB and apex.  No rubs or gallops, 2+ pulses, warm extremities.   Respiratory: CTAB, faint rales at the left base, no wheezes or rhonchi or increased WOB ABD:  NABS, soft, nondistended, nontender, no organomegaly MSK:  No LEE  Discharge Instructions      Discharge Orders   Future Orders Complete By Expires     ([place]) Call MD:  Anytime you have any of the following symptoms: 1)  3 pound weight gain in 24 hours or 5 pounds in 1 week 2) shortness of breath, with or without a dry hacking cough 3) swelling in the hands, feet or stomach 4) if you have to sleep on extra pillows at night in order to breathe.  As directed     Call MD for:  difficulty breathing, headache or visual disturbances  As directed     Call MD for:  extreme fatigue  As directed     Call MD for:  hives  As directed     Call MD for:  persistant dizziness or light-headedness  As directed     Call MD for:  persistant nausea and vomiting  As directed     Call MD for:  severe uncontrolled pain  As directed     Call MD for:  temperature >100.4  As directed     Diet - low sodium heart healthy  As directed     Comments:      2gm sodium    Discharge instructions  As directed     Comments:      You were hospitalized with community acquired pneumonia and a heart failure exacerbation.  You were given antibiotics and lasix and your breathing rapidly improved.  You should continue a course of antibiotics for a total of 7 days, your next dose is due this evening.  You should also continue taking a low dose of lasix until you follow up with your primary care doctor in 1 week for a weight check and repeat electrolytes.  You had a mildly elevated potassium when you were admitted, which can be a side effect of your losartan.  It was stopped today, but you may restart it tomorrow and have your potassium level checked on Friday.  Please eat a 2gm sodium diet.  If you gain more than 3-lbs in one day or 5-lbs in a week, call your primary care doctor.  Return to the hospital, if you develop worsening shortness of breath/difficulty breathing.    Increase activity slowly  As directed         Medication List    TAKE these medications       aspirin 81 MG chewable tablet  Chew 81 mg by mouth every evening.     calcium-vitamin D 500-200 MG-UNIT per tablet  Commonly known as:  OSCAL WITH D  Take 1 tablet by mouth every morning.      clopidogrel 75 MG tablet  Commonly known as:  PLAVIX  Take 75 mg by mouth every morning.     DSS 100 MG Caps  Take 100 mg by mouth 2 (two) times daily.     ferrous sulfate 325 (65 FE) MG tablet  Take 325 mg by mouth at bedtime.     furosemide 20 MG tablet  Commonly known as:  LASIX  Take  1 tablet (20 mg total) by mouth daily.     HYDROcodone-acetaminophen 5-500 MG per tablet  Commonly known as:  VICODIN  Take 2 tablets by mouth at bedtime.     insulin detemir 100 UNIT/ML injection  Commonly known as:  LEVEMIR  Inject 16 Units into the skin at bedtime.     levofloxacin 500 MG tablet  Commonly known as:  LEVAQUIN  Take 1 tablet (500 mg total) by mouth daily.     levothyroxine 137 MCG tablet  Commonly known as:  SYNTHROID, LEVOTHROID  Take 137 mcg by mouth daily before breakfast.     losartan 25 MG tablet  Commonly known as:  COZAAR  Take 25 mg by mouth every morning.     magnesium oxide 400 MG tablet  Commonly known as:  MAG-OX  Take 800 mg by mouth at bedtime.     metFORMIN 500 MG tablet  Commonly known as:  GLUCOPHAGE  Take 1,000 mg by mouth at bedtime.     metoprolol succinate 25 MG 24 hr tablet  Commonly known as:  TOPROL-XL  Take 25 mg by mouth every morning.     vitamin B-12 1000 MCG tablet  Commonly known as:  CYANOCOBALAMIN  Take 1,000 mcg by mouth every morning.     vitamin C 500 MG tablet  Commonly known as:  ASCORBIC ACID  Take 500 mg by mouth every morning.     Vitamin D (Ergocalciferol) 50000 UNITS Caps  Commonly known as:  DRISDOL  Take 50,000 Units by mouth every 7 (seven) days. On thursdays       Follow-up Information   Follow up with [facility], MD. Schedule an appointment as soon as possible for a visit in 1 week.   Contact information:   [place]  [address] [phone]        The results of significant diagnostics from this hospitalization (including imaging, microbiology,  ancillary and laboratory) are listed below for reference.    Significant Diagnostic Studies: Dg Chest 2 View  05/07/2012  *RADIOLOGY REPORT*  Clinical Data: Shortness of breath  CHEST - 2 VIEW  Comparison: 03/08/2002.  Findings: Increasing left lower lobe opacity, suspicious for pneumonia.  Underlying pleural calcifications and basilar scarring.  No definite pleural effusion.  No pneumothorax.  The heart normal in size. Postsurgical changes related to prior CABG.  Visualized osseous structures are within normal limits.  IMPRESSION: Increasing left lower lobe opacity, suspicious for pneumonia.  Underlying pleural calcifications and basilar scarring.   Original Report Authenticated By: Julian Hy, M.D.     Microbiology: No results found for this or any previous visit (from the past 240 hour(s)).   Labs: Basic Metabolic Panel:  Recent Labs Lab 05/07/12 1224 05/07/12 1710 05/08/12 0450  NA 136  --  136  K 5.5*  --  4.5  CL 105  --  100  CO2 22  --  25  GLUCOSE 132*  --  133*  BUN 24*  --  32*  CREATININE 1.17* 1.44* 1.50*  CALCIUM 9.2  --  9.7  MG  --   --  1.7   Liver Function Tests:  Recent Labs Lab 05/07/12 1224  AST 17  ALT 11  ALKPHOS 54  BILITOT 0.3  PROT 6.3  ALBUMIN 3.1*   No results found for this basename: LIPASE, AMYLASE,  in the last 168 hours No results found for this basename: AMMONIA,  in the last 168 hours CBC:  Recent Labs Lab 05/07/12 1224 05/08/12 0450  WBC 6.2 5.2  NEUTROABS 4.8  --   HGB 12.3 12.0  HCT 38.9 37.1  MCV 94.6 92.5  PLT 199 191   Cardiac Enzymes:  Recent Labs Lab 05/07/12 1710 05/07/12 2310 05/08/12 0450  TROPONINI <0.30 <0.30 <0.30   BNP: BNP (last 3 results)  Recent Labs  05/07/12 1225  PROBNP 5911.0*   CBG:  Recent Labs Lab 05/07/12 1442 05/07/12 1634 05/07/12 2139 05/08/12 0821  GLUCAP 128* 169* 140* 118*    Time coordinating discharge: 45  minutes  Signed:  SHORT, MACKENZIE  [facility] 05/08/2012, 9:54 AM

## 2012-05-08 NOTE — Care Management Note (Signed)
    Page 1 of 1   05/08/2012     1:09:15 PM   CARE MANAGEMENT NOTE 05/08/2012  Patient:  [name] [name], [name] [name]   Account Number:  [id number]  Date Initiated:  05/08/2012  Documentation initiated by:  Dessa Phi  Subjective/Objective Assessment:   ADMITTED W/SOB.CHF.     Action/Plan:   FROM HOME.HAS PCP,PHARMACY.   Anticipated DC Date:  05/08/2012   Anticipated DC Plan:  [place]  CM consult      Choice offered to / List presented to:             Status of service:  Completed, signed off Medicare Important Message given?   (If response is "NO", the following Medicare IM given date fields will be blank) Date Medicare IM given:   Date Additional Medicare IM given:    Discharge Disposition:  HOME/SELF CARE  Per UR Regulation:  Reviewed for med. necessity/level of care/duration of stay  If discussed at [place] of Stay Meetings, dates discussed:    Comments:  05/08/12 KATHY MAHABIR RN,BSN NCM [phone] D/C HOME NO NEEDS OR DORDERS.

## 2012-05-17 DIAGNOSIS — I739 Peripheral vascular disease, unspecified: Secondary | ICD-10-CM | POA: Diagnosis not present

## 2012-05-17 DIAGNOSIS — I2510 Atherosclerotic heart disease of native coronary artery without angina pectoris: Secondary | ICD-10-CM | POA: Diagnosis not present

## 2012-05-17 DIAGNOSIS — Z951 Presence of aortocoronary bypass graft: Secondary | ICD-10-CM | POA: Diagnosis not present

## 2012-05-19 ENCOUNTER — Other Ambulatory Visit (HOSPITAL_COMMUNITY): Payer: Self-pay | Admitting: Cardiovascular Disease

## 2012-05-19 DIAGNOSIS — I739 Peripheral vascular disease, unspecified: Secondary | ICD-10-CM

## 2012-05-30 DIAGNOSIS — N390 Urinary tract infection, site not specified: Secondary | ICD-10-CM | POA: Diagnosis not present

## 2012-05-30 DIAGNOSIS — N1330 Unspecified hydronephrosis: Secondary | ICD-10-CM | POA: Diagnosis not present

## 2012-06-05 DIAGNOSIS — N1330 Unspecified hydronephrosis: Secondary | ICD-10-CM | POA: Diagnosis not present

## 2012-06-07 ENCOUNTER — Inpatient Hospital Stay (HOSPITAL_COMMUNITY)
Admission: EM | Admit: 2012-06-07 | Discharge: 2012-06-12 | DRG: 689 | Disposition: A | Discharge status: Home Health | Payer: Medicare Other | Attending: Internal Medicine | Admitting: Internal Medicine

## 2012-06-07 ENCOUNTER — Emergency Department (HOSPITAL_COMMUNITY): Payer: Medicare Other

## 2012-06-07 ENCOUNTER — Encounter (HOSPITAL_COMMUNITY): Payer: Self-pay | Admitting: *Deleted

## 2012-06-07 DIAGNOSIS — Z79899 Other long term (current) drug therapy: Secondary | ICD-10-CM | POA: Diagnosis not present

## 2012-06-07 DIAGNOSIS — R4182 Altered mental status, unspecified: Secondary | ICD-10-CM

## 2012-06-07 DIAGNOSIS — N1330 Unspecified hydronephrosis: Secondary | ICD-10-CM | POA: Diagnosis present

## 2012-06-07 DIAGNOSIS — Z794 Long term (current) use of insulin: Secondary | ICD-10-CM

## 2012-06-07 DIAGNOSIS — I2589 Other forms of chronic ischemic heart disease: Secondary | ICD-10-CM | POA: Diagnosis present

## 2012-06-07 DIAGNOSIS — Z87891 Personal history of nicotine dependence: Secondary | ICD-10-CM

## 2012-06-07 DIAGNOSIS — Z951 Presence of aortocoronary bypass graft: Secondary | ICD-10-CM | POA: Diagnosis present

## 2012-06-07 DIAGNOSIS — E039 Hypothyroidism, unspecified: Secondary | ICD-10-CM | POA: Diagnosis present

## 2012-06-07 DIAGNOSIS — N179 Acute kidney failure, unspecified: Secondary | ICD-10-CM | POA: Diagnosis not present

## 2012-06-07 DIAGNOSIS — J9819 Other pulmonary collapse: Secondary | ICD-10-CM | POA: Diagnosis not present

## 2012-06-07 DIAGNOSIS — E860 Dehydration: Secondary | ICD-10-CM | POA: Diagnosis not present

## 2012-06-07 DIAGNOSIS — M1990 Unspecified osteoarthritis, unspecified site: Secondary | ICD-10-CM

## 2012-06-07 DIAGNOSIS — E119 Type 2 diabetes mellitus without complications: Secondary | ICD-10-CM

## 2012-06-07 DIAGNOSIS — E871 Hypo-osmolality and hyponatremia: Secondary | ICD-10-CM | POA: Diagnosis present

## 2012-06-07 DIAGNOSIS — J90 Pleural effusion, not elsewhere classified: Secondary | ICD-10-CM | POA: Diagnosis not present

## 2012-06-07 DIAGNOSIS — R109 Unspecified abdominal pain: Secondary | ICD-10-CM

## 2012-06-07 DIAGNOSIS — I509 Heart failure, unspecified: Secondary | ICD-10-CM | POA: Diagnosis present

## 2012-06-07 DIAGNOSIS — I6529 Occlusion and stenosis of unspecified carotid artery: Secondary | ICD-10-CM | POA: Diagnosis present

## 2012-06-07 DIAGNOSIS — I2510 Atherosclerotic heart disease of native coronary artery without angina pectoris: Secondary | ICD-10-CM | POA: Diagnosis not present

## 2012-06-07 DIAGNOSIS — I5042 Chronic combined systolic (congestive) and diastolic (congestive) heart failure: Secondary | ICD-10-CM | POA: Diagnosis present

## 2012-06-07 DIAGNOSIS — I739 Peripheral vascular disease, unspecified: Secondary | ICD-10-CM | POA: Diagnosis present

## 2012-06-07 DIAGNOSIS — I129 Hypertensive chronic kidney disease with stage 1 through stage 4 chronic kidney disease, or unspecified chronic kidney disease: Secondary | ICD-10-CM | POA: Diagnosis present

## 2012-06-07 DIAGNOSIS — E785 Hyperlipidemia, unspecified: Secondary | ICD-10-CM | POA: Diagnosis present

## 2012-06-07 DIAGNOSIS — D649 Anemia, unspecified: Secondary | ICD-10-CM

## 2012-06-07 DIAGNOSIS — R509 Fever, unspecified: Secondary | ICD-10-CM | POA: Diagnosis not present

## 2012-06-07 DIAGNOSIS — N189 Chronic kidney disease, unspecified: Secondary | ICD-10-CM | POA: Diagnosis present

## 2012-06-07 DIAGNOSIS — N390 Urinary tract infection, site not specified: Secondary | ICD-10-CM | POA: Diagnosis not present

## 2012-06-07 DIAGNOSIS — E875 Hyperkalemia: Secondary | ICD-10-CM | POA: Diagnosis present

## 2012-06-07 DIAGNOSIS — G9341 Metabolic encephalopathy: Secondary | ICD-10-CM | POA: Diagnosis present

## 2012-06-07 DIAGNOSIS — I714 Abdominal aortic aneurysm, without rupture, unspecified: Secondary | ICD-10-CM | POA: Diagnosis present

## 2012-06-07 DIAGNOSIS — N3090 Cystitis, unspecified without hematuria: Principal | ICD-10-CM | POA: Diagnosis present

## 2012-06-07 DIAGNOSIS — A498 Other bacterial infections of unspecified site: Secondary | ICD-10-CM | POA: Diagnosis present

## 2012-06-07 DIAGNOSIS — F29 Unspecified psychosis not due to a substance or known physiological condition: Secondary | ICD-10-CM | POA: Diagnosis not present

## 2012-06-07 DIAGNOSIS — G9340 Encephalopathy, unspecified: Secondary | ICD-10-CM | POA: Diagnosis not present

## 2012-06-07 DIAGNOSIS — I10 Essential (primary) hypertension: Secondary | ICD-10-CM | POA: Diagnosis present

## 2012-06-07 LAB — CBC WITH DIFFERENTIAL/PLATELET
Eosinophils Absolute: 0 10*3/uL (ref 0.0–0.7)
HCT: 35.4 % — ABNORMAL LOW (ref 36.0–46.0)
Hemoglobin: 11.8 g/dL — ABNORMAL LOW (ref 12.0–15.0)
Lymphs Abs: 0.6 10*3/uL — ABNORMAL LOW (ref 0.7–4.0)
MCH: 30.7 pg (ref 26.0–34.0)
MCHC: 33.3 g/dL (ref 30.0–36.0)
Monocytes Absolute: 1.7 10*3/uL — ABNORMAL HIGH (ref 0.1–1.0)
Monocytes Relative: 12 % (ref 3–12)
Neutro Abs: 12 10*3/uL — ABNORMAL HIGH (ref 1.7–7.7)
Neutrophils Relative %: 84 % — ABNORMAL HIGH (ref 43–77)
RBC: 3.84 MIL/uL — ABNORMAL LOW (ref 3.87–5.11)

## 2012-06-07 LAB — LACTIC ACID, PLASMA: Lactic Acid, Venous: 2 mmol/L (ref 0.5–2.2)

## 2012-06-07 LAB — URINALYSIS, ROUTINE W REFLEX MICROSCOPIC
Bilirubin Urine: NEGATIVE
Glucose, UA: NEGATIVE mg/dL
Ketones, ur: NEGATIVE mg/dL
Nitrite: NEGATIVE
Protein, ur: >300 mg/dL — AB
Specific Gravity, Urine: 1.025 (ref 1.005–1.030)
Urobilinogen, UA: 1 mg/dL (ref 0.0–1.0)
pH: 6 (ref 5.0–8.0)

## 2012-06-07 LAB — BASIC METABOLIC PANEL
BUN: 47 mg/dL — ABNORMAL HIGH (ref 6–23)
Chloride: 92 mEq/L — ABNORMAL LOW (ref 96–112)
GFR calc non Af Amer: 27 mL/min — ABNORMAL LOW (ref >90)
Glucose, Bld: 193 mg/dL — ABNORMAL HIGH (ref 70–99)
Potassium: 5.3 mEq/L — ABNORMAL HIGH (ref 3.5–5.1)
Sodium: 129 mEq/L — ABNORMAL LOW (ref 135–145)

## 2012-06-07 LAB — URINE MICROSCOPIC-ADD ON

## 2012-06-07 MED ORDER — OXYBUTYNIN CHLORIDE 5 MG PO TABS
5.0000 mg | ORAL_TABLET | Freq: Three times a day (TID) | ORAL | Status: DC
  Administered 2012-06-08 – 2012-06-12 (×14): 5 mg via ORAL
  Filled 2012-06-07 (×16): qty 1

## 2012-06-07 MED ORDER — ASPIRIN 81 MG PO CHEW
81.0000 mg | CHEWABLE_TABLET | Freq: Every day | ORAL | Status: DC
  Administered 2012-06-08 – 2012-06-11 (×4): 81 mg via ORAL
  Filled 2012-06-07 (×6): qty 1

## 2012-06-07 MED ORDER — ACETAMINOPHEN 500 MG PO TABS
1000.0000 mg | ORAL_TABLET | Freq: Once | ORAL | Status: AC
  Administered 2012-06-07: 1000 mg via ORAL
  Filled 2012-06-07: qty 1

## 2012-06-07 MED ORDER — HYDROCODONE-ACETAMINOPHEN 5-325 MG PO TABS
1.0000 | ORAL_TABLET | ORAL | Status: DC | PRN
  Administered 2012-06-08: 2 via ORAL
  Filled 2012-06-07: qty 2

## 2012-06-07 MED ORDER — METFORMIN HCL ER 500 MG PO TB24: 1000.0000 mg | ORAL_TABLET | Freq: Every day | ORAL | Status: DC

## 2012-06-07 MED ORDER — PANTOPRAZOLE SODIUM 40 MG PO TBEC
40.0000 mg | DELAYED_RELEASE_TABLET | Freq: Every day | ORAL | Status: DC
  Administered 2012-06-08 – 2012-06-12 (×5): 40 mg via ORAL
  Filled 2012-06-07 (×5): qty 1

## 2012-06-07 MED ORDER — ONDANSETRON HCL 4 MG/2ML IJ SOLN: 4.0000 mg | Freq: Four times a day (QID) | INTRAMUSCULAR | Status: DC | PRN

## 2012-06-07 MED ORDER — FERROUS SULFATE 325 (65 FE) MG PO TABS
325.0000 mg | ORAL_TABLET | Freq: Every day | ORAL | Status: DC
  Administered 2012-06-08 – 2012-06-11 (×5): 325 mg via ORAL
  Filled 2012-06-07 (×6): qty 1

## 2012-06-07 MED ORDER — INSULIN ASPART 100 UNIT/ML ~~LOC~~ SOLN
0.0000 [IU] | Freq: Three times a day (TID) | SUBCUTANEOUS | Status: DC
  Administered 2012-06-08 (×2): 3 [IU] via SUBCUTANEOUS
  Administered 2012-06-08: 8 [IU] via SUBCUTANEOUS
  Administered 2012-06-09 (×2): 3 [IU] via SUBCUTANEOUS
  Administered 2012-06-09 – 2012-06-10 (×2): 2 [IU] via SUBCUTANEOUS
  Administered 2012-06-10: 3 [IU] via SUBCUTANEOUS
  Administered 2012-06-11: 2 [IU] via SUBCUTANEOUS
  Administered 2012-06-11: 3 [IU] via SUBCUTANEOUS
  Administered 2012-06-11: 09:00:00 via SUBCUTANEOUS
  Administered 2012-06-12 (×2): 3 [IU] via SUBCUTANEOUS

## 2012-06-07 MED ORDER — MORPHINE SULFATE 2 MG/ML IJ SOLN: 2.0000 mg | INTRAMUSCULAR | Status: DC | PRN

## 2012-06-07 MED ORDER — ONDANSETRON HCL 4 MG PO TABS
4.0000 mg | ORAL_TABLET | Freq: Four times a day (QID) | ORAL | Status: DC | PRN
  Administered 2012-06-08: 4 mg via ORAL
  Filled 2012-06-07: qty 1

## 2012-06-07 MED ORDER — SODIUM CHLORIDE 0.9 % IV SOLN
INTRAVENOUS | Status: DC
  Administered 2012-06-08 (×3): via INTRAVENOUS
  Administered 2012-06-09: 75 mL/h via INTRAVENOUS

## 2012-06-07 MED ORDER — VITAMIN D (ERGOCALCIFEROL) 1.25 MG (50000 UNIT) PO CAPS
50000.0000 [IU] | ORAL_CAPSULE | ORAL | Status: DC
  Administered 2012-06-08: 50000 [IU] via ORAL
  Filled 2012-06-07: qty 1

## 2012-06-07 MED ORDER — DOCUSATE SODIUM 100 MG PO CAPS
100.0000 mg | ORAL_CAPSULE | Freq: Two times a day (BID) | ORAL | Status: DC
  Administered 2012-06-08 – 2012-06-12 (×9): 100 mg via ORAL
  Filled 2012-06-07 (×12): qty 1

## 2012-06-07 MED ORDER — LEVOTHYROXINE SODIUM 137 MCG PO TABS
137.0000 ug | ORAL_TABLET | Freq: Every day | ORAL | Status: DC
  Administered 2012-06-08 – 2012-06-12 (×5): 137 ug via ORAL
  Filled 2012-06-07 (×6): qty 1

## 2012-06-07 MED ORDER — VITAMIN B-12 1000 MCG PO TABS
1000.0000 ug | ORAL_TABLET | Freq: Every morning | ORAL | Status: DC
  Administered 2012-06-08 – 2012-06-12 (×5): 1000 ug via ORAL
  Filled 2012-06-07 (×5): qty 1

## 2012-06-07 MED ORDER — CILOSTAZOL 100 MG PO TABS
100.0000 mg | ORAL_TABLET | Freq: Two times a day (BID) | ORAL | Status: DC
  Administered 2012-06-08 – 2012-06-12 (×10): 100 mg via ORAL
  Filled 2012-06-07 (×11): qty 1

## 2012-06-07 MED ORDER — METOPROLOL SUCCINATE ER 25 MG PO TB24
25.0000 mg | ORAL_TABLET | Freq: Every morning | ORAL | Status: DC
  Administered 2012-06-08 – 2012-06-12 (×5): 25 mg via ORAL
  Filled 2012-06-07 (×5): qty 1

## 2012-06-07 MED ORDER — ACETAMINOPHEN 325 MG PO TABS: 650.0000 mg | ORAL_TABLET | Freq: Four times a day (QID) | ORAL | Status: DC | PRN

## 2012-06-07 MED ORDER — DEXTROSE 5 % IV SOLN
1.0000 g | INTRAVENOUS | Status: DC
  Filled 2012-06-07: qty 10

## 2012-06-07 MED ORDER — ZOLPIDEM TARTRATE 5 MG PO TABS: 5.0000 mg | ORAL_TABLET | Freq: Every evening | ORAL | Status: DC | PRN

## 2012-06-07 MED ORDER — SODIUM CHLORIDE 0.9 % IV SOLN
INTRAVENOUS | Status: DC
  Administered 2012-06-07: 18:00:00 via INTRAVENOUS

## 2012-06-07 MED ORDER — VITAMIN C 500 MG PO TABS
500.0000 mg | ORAL_TABLET | Freq: Every morning | ORAL | Status: DC
  Administered 2012-06-08 – 2012-06-12 (×5): 500 mg via ORAL
  Filled 2012-06-07 (×5): qty 1

## 2012-06-07 MED ORDER — ACETAMINOPHEN 650 MG RE SUPP: 650.0000 mg | Freq: Four times a day (QID) | RECTAL | Status: DC | PRN

## 2012-06-07 MED ORDER — DEXTROSE 5 % IV SOLN
1.0000 g | Freq: Once | INTRAVENOUS | Status: AC
  Administered 2012-06-07: 1 g via INTRAVENOUS
  Filled 2012-06-07: qty 10

## 2012-06-07 MED ORDER — HEPARIN SODIUM (PORCINE) 5000 UNIT/ML IJ SOLN
5000.0000 [IU] | Freq: Three times a day (TID) | INTRAMUSCULAR | Status: DC
  Administered 2012-06-08 – 2012-06-11 (×11): 5000 [IU] via SUBCUTANEOUS
  Filled 2012-06-07 (×14): qty 1

## 2012-06-07 MED ORDER — FENOFIBRATE 54 MG PO TABS
54.0000 mg | ORAL_TABLET | Freq: Every day | ORAL | Status: DC
  Administered 2012-06-08 – 2012-06-12 (×5): 54 mg via ORAL
  Filled 2012-06-07 (×5): qty 1

## 2012-06-07 MED ORDER — LOSARTAN POTASSIUM 25 MG PO TABS
25.0000 mg | ORAL_TABLET | Freq: Every day | ORAL | Status: DC
  Administered 2012-06-08: 25 mg via ORAL
  Filled 2012-06-07: qty 1

## 2012-06-07 MED ORDER — CLOPIDOGREL BISULFATE 75 MG PO TABS
75.0000 mg | ORAL_TABLET | Freq: Every morning | ORAL | Status: DC
  Administered 2012-06-08 – 2012-06-12 (×5): 75 mg via ORAL
  Filled 2012-06-07 (×5): qty 1

## 2012-06-07 MED ORDER — SODIUM POLYSTYRENE SULFONATE 15 GM/60ML PO SUSP
15.0000 g | Freq: Once | ORAL | Status: AC
  Administered 2012-06-08: 15 g via ORAL
  Filled 2012-06-07: qty 60

## 2012-06-07 NOTE — Progress Notes (Signed)
[facility] ED CM noted CM consult for possible needs for home health CM spoke with pt about CM consult who confirms she has not had home services but has a cane, 2 walkers and a bedside commode.ED RN, Gerri Lins stated Daughter, [name] wanted to speak with CM Called daughter's Cell phone:[phone] and left a voice message briefly mentioned cm consult and list of home health agencies left in pt belonging bag Encouraged her to return call to CM for questions.    Choices offered   [place] that are Medicare-Certified and are affiliated with [facility]  Telephone Number Address  [place] has ownership interest in this company; however, you are under no obligation to use this agency. [phone] or  [place], [place] [address] [email or web address]   Agencies that are Medicare-Certified and are not affiliated with [facility] Telephone Number Address  [facility] [phone] Fax [phone] [address], [place], [place]  [address] [email or web address]  [facility] [phone] or [phone] Fax [phone] [id number] [place], [name] [address] [email or web address]  [place] [phone] Fax [phone] [place], [place] [address] [email or web address]  [place] [phone] Fax [phone] [address] [place], [place]  [address] [email or web address]  [facility] [phone] Fax [phone] [address] [place], [place] [address] [email or web address]  [facility] [place], [place] [address] [email or web address]  [facility] [phone]  [address], [place] [address] [email or web address]  [facility] [phone] or [phone] Fax number [phone] [address], [place] [address] [place], [place]  [address] [email or web address]  [place] [phone] Fax [phone] [place], [place]  [address]  [place]  [phone] Fax [phone] [address] [place], [place] [address] [email or web address]

## 2012-06-07 NOTE — ED Notes (Signed)
Please call pt's daughter Mahalia Bresler about care management and to provide information about home health. Cell phone:[phone]; work phone for Thursday 06/08/2012 is [phone] (9a-6p).

## 2012-06-07 NOTE — ED Provider Notes (Signed)
History     CSN: [id number]  Arrival date & time 06/07/12  1616   First MD Initiated Contact with Patient 06/07/12 1621      Chief Complaint  Patient presents with  . Altered Mental Status     HPI Pt was seen at 1650.   Per pt and her daughter, c/o gradual onset and persistence of intermittent episodes of "confusion" for the past 2 days. Pt had a "kidney stent removed for a UTI" 3 days ago before symptoms began.  Pt states she feels "weak," has had dysuria, and was "sleeping all day" yesterday. Denies home fevers, no back pain, no abd pain, no N/V/D, no CP/SOB, no cough.       Past Medical History  Diagnosis Date  . Diabetes mellitus without complication   . CHF (congestive heart failure)   . Coronary artery disease   . AAA (abdominal aortic aneurysm) 03/09/2012  . PVD (peripheral vascular disease)   . Carotid artery stenosis   . Hypertension   . Hyperlipidemia   . Hypothyroidism     Past Surgical History  Procedure Laterality Date  . Cardiac surgery    . Abdominal hysterectomy    . Tumor removal    . Coronary stent placement    . Femoral artery repair    . Cystoscopy w/ ureteral stent placement  03/10/2012    Procedure: CYSTOSCOPY WITH RETROGRADE PYELOGRAM/URETERAL STENT PLACEMENT;  Surgeon: Hanley Ben, MD;  Location: [facility];  Service: Urology;  Laterality: Left;    History  Substance Use Topics  . Smoking status: Former Research scientist (life sciences)  . Smokeless tobacco: Never Used  . Alcohol Use: No      Review of Systems ROS: Statement: All systems negative except as marked or noted in the HPI; Constitutional: Negative for fever and chills. +generalized weakness/fatigue ; ; Eyes: Negative for eye pain, redness and discharge. ; ; ENMT: Negative for ear pain, hoarseness, nasal congestion, sinus pressure and sore throat. ; ; Cardiovascular: Negative for chest pain, palpitations, diaphoresis, dyspnea and peripheral edema. ; ; Respiratory: Negative for cough, wheezing and stridor. ; ;  Gastrointestinal: Negative for nausea, vomiting, diarrhea, abdominal pain, blood in stool, hematemesis, jaundice and rectal bleeding. . ; ; Genitourinary: +dysuria. Negative for flank pain and hematuria. ; ; Musculoskeletal: Negative for back pain and neck pain. Negative for swelling and trauma.; ; Skin: Negative for pruritus, rash, abrasions, blisters, bruising and skin lesion.; ; Neuro: +AMS. Negative for headache, lightheadedness and neck stiffness. Negative for altered level of consciousness , extremity weakness, paresthesias, involuntary movement, seizure and syncope.       Allergies  Review of patient's allergies indicates no known allergies.  Home Medications   Current Outpatient Rx  Name  Route  Sig  Dispense  Refill  . aspirin 81 MG chewable tablet   Oral   Chew 81 mg by mouth at bedtime.          . calcium-vitamin D (OSCAL WITH D) 500-200 MG-UNIT per tablet   Oral   Take 1 tablet by mouth every morning.         . Choline Fenofibrate (TRILIPIX) 135 MG capsule   Oral   Take 135 mg by mouth at bedtime.         . cilostazol (PLETAL) 100 MG tablet   Oral   Take 100 mg by mouth 2 (two) times daily.         . clopidogrel (PLAVIX) 75 MG tablet   Oral   Take  75 mg by mouth every morning.          . docusate sodium (COLACE) 100 MG capsule   Oral   Take 100 mg by mouth 2 (two) times daily as needed for constipation.         . ferrous sulfate 325 (65 FE) MG tablet   Oral   Take 325 mg by mouth at bedtime.          . furosemide (LASIX) 20 MG tablet   Oral   Take 1 tablet (20 mg total) by mouth daily.   30 tablet   0   . HYDROcodone-acetaminophen (VICODIN) 5-500 MG per tablet   Oral   Take 2 tablets by mouth at bedtime.         . insulin aspart (NOVOLOG) 100 UNIT/ML injection   Subcutaneous   Inject 8 Units into the skin daily. Right before lunch.         . insulin detemir (LEVEMIR) 100 UNIT/ML injection   Subcutaneous   Inject 16 Units into the  skin at bedtime.         Marland Kitchen levothyroxine (SYNTHROID, LEVOTHROID) 137 MCG tablet   Oral   Take 137 mcg by mouth daily before breakfast.         . losartan (COZAAR) 25 MG tablet   Oral   Take 25 mg by mouth daily.          . magnesium oxide (MAG-OX) 400 MG tablet   Oral   Take 800 mg by mouth at bedtime.          . metFORMIN (GLUCOPHAGE-XR) 500 MG 24 hr tablet   Oral   Take 1,000 mg by mouth at bedtime.         . metoprolol succinate (TOPROL-XL) 25 MG 24 hr tablet   Oral   Take 25 mg by mouth every morning.          . nitrofurantoin, macrocrystal-monohydrate, (MACROBID) 100 MG capsule   Oral   Take 100 mg by mouth 2 (two) times daily. 5 day course of therapy started 06/03/12.         Marland Kitchen omeprazole (PRILOSEC) 20 MG capsule   Oral   Take 20 mg by mouth daily.         Marland Kitchen oxybutynin (DITROPAN) 5 MG tablet   Oral   Take 5 mg by mouth 3 (three) times daily.         . vitamin B-12 (CYANOCOBALAMIN) 1000 MCG tablet   Oral   Take 1,000 mcg by mouth every morning.         . vitamin C (ASCORBIC ACID) 500 MG tablet   Oral   Take 500 mg by mouth every morning.         . Vitamin D, Ergocalciferol, (DRISDOL) 50000 UNITS CAPS   Oral   Take 50,000 Units by mouth every 7 (seven) days. On thursdays         . rosuvastatin (CRESTOR) 20 MG tablet   Oral   Take 20 mg by mouth daily.           BP 113/73  Pulse 83  Temp(Src) 102.5 F (39.2 C) (Rectal)  SpO2 95%  Physical Exam 1655: Physical examination:  Nursing notes reviewed; Vital signs and O2 SAT reviewed; +febrile.;; Constitutional: Well developed, Well nourished, In no acute distress; Head:  Normocephalic, atraumatic; Eyes: EOMI, PERRL, No scleral icterus; ENMT: Mouth and pharynx normal, Mucous membranes dry; Neck: Supple, Full range of motion,  No lymphadenopathy; Cardiovascular: Regular rate and rhythm, No gallop; Respiratory: Breath sounds clear & equal bilaterally, No rales, rhonchi, wheezes.  Speaking  full sentences with ease, Normal respiratory effort/excursion; Chest: Nontender, Movement normal; Abdomen: Soft, Nontender, Nondistended, Normal bowel sounds; Genitourinary: No CVA tenderness; Extremities: Pulses normal, No tenderness, No edema, No calf edema or asymmetry.; Neuro: AA&Ox3, Major CN grossly intact.  Speech clear. No facial droop. No gross focal motor or sensory deficits in extremities.; Skin: Color normal, Warm, Dry.   ED Course  Procedures    MDM  MDM Reviewed: previous chart, nursing note and vitals Reviewed previous: labs and ECG Interpretation: labs, ECG, x-ray and CT scan    Date: 06/07/2012  Rate: 79  Rhythm: normal sinus rhythm  QRS Axis: normal  Intervals: normal  ST/T Wave abnormalities: nonspecific ST/T changes  Conduction Disutrbances:none  Narrative Interpretation:   Old EKG Reviewed: changes noted; more pronounced diffuse STTW changes compared to previous EKG dated 03/08/2012.  Results for orders placed during the hospital encounter of 06/07/12  URINALYSIS, ROUTINE W REFLEX MICROSCOPIC      Result Value Range   Color, Urine AMBER (*) YELLOW   APPearance TURBID (*) CLEAR   Specific Gravity, Urine 1.025  1.005 - 1.030   pH 6.0  5.0 - 8.0   Glucose, UA NEGATIVE  NEGATIVE mg/dL   Hgb urine dipstick LARGE (*) NEGATIVE   Bilirubin Urine NEGATIVE  NEGATIVE   Ketones, ur NEGATIVE  NEGATIVE mg/dL   Protein, ur >300 (*) NEGATIVE mg/dL   Urobilinogen, UA 1.0  0.0 - 1.0 mg/dL   Nitrite NEGATIVE  NEGATIVE   Leukocytes, UA LARGE (*) NEGATIVE  BASIC METABOLIC PANEL      Result Value Range   Sodium 129 (*) 135 - 145 mEq/L   Potassium 5.3 (*) 3.5 - 5.1 mEq/L   Chloride 92 (*) 96 - 112 mEq/L   CO2 26  19 - 32 mEq/L   Glucose, Bld 193 (*) 70 - 99 mg/dL   BUN 47 (*) 6 - 23 mg/dL   Creatinine, Ser 1.81 (*) 0.50 - 1.10 mg/dL   Calcium 9.1  8.4 - 10.5 mg/dL   GFR calc non Af Amer 27 (*) >90 mL/min   GFR calc Af Amer 31 (*) >90 mL/min  CBC WITH DIFFERENTIAL       Result Value Range   WBC 14.3 (*) 4.0 - 10.5 K/uL   RBC 3.84 (*) 3.87 - 5.11 MIL/uL   Hemoglobin 11.8 (*) 12.0 - 15.0 g/dL   HCT 35.4 (*) 36.0 - 46.0 %   MCV 92.2  78.0 - 100.0 fL   MCH 30.7  26.0 - 34.0 pg   MCHC 33.3  30.0 - 36.0 g/dL   RDW 14.1  11.5 - 15.5 %   Platelets 225  150 - 400 K/uL   Neutrophils Relative 84 (*) 43 - 77 %   Neutro Abs 12.0 (*) 1.7 - 7.7 K/uL   Lymphocytes Relative 4 (*) 12 - 46 %   Lymphs Abs 0.6 (*) 0.7 - 4.0 K/uL   Monocytes Relative 12  3 - 12 %   Monocytes Absolute 1.7 (*) 0.1 - 1.0 K/uL   Eosinophils Relative 0  0 - 5 %   Eosinophils Absolute 0.0  0.0 - 0.7 K/uL   Basophils Relative 0  0 - 1 %   Basophils Absolute 0.0  0.0 - 0.1 K/uL  TROPONIN I      Result Value Range   Troponin I <0.30  <  0.30 ng/mL  LACTIC ACID, PLASMA      Result Value Range   Lactic Acid, Venous 2.0  0.5 - 2.2 mmol/L  URINE MICROSCOPIC-ADD ON      Result Value Range   WBC, UA 21-50  <3 WBC/hpf   RBC / HPF 3-6  <3 RBC/hpf   Bacteria, UA MANY (*) RARE   Dg Chest 2 View 06/07/2012  *RADIOLOGY REPORT*  Clinical Data: Altered mental status.  CHEST - 2 VIEW  Comparison: 05/07/2012  Findings: Stable surgical changes.  The heart is normal in size. The mediastinal and hilar contours are within normal limits and unchanged.  The lungs are clear except for minimal streaky left basilar atelectasis and a very small left pleural effusion.  No pulmonary edema.  The bony thorax is intact.  IMPRESSION: Small left effusion with minimal overlying atelectasis.   Original Report Authenticated By: Marijo Sanes, M.D.    Ct Head Wo Contrast 06/07/2012  *RADIOLOGY REPORT*  Clinical Data: Confusion and disorientation.  CT HEAD WITHOUT CONTRAST  Technique:  Contiguous axial images were obtained from the base of the skull through the vertex without contrast.  Comparison: 03/08/2012.  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  The  ventricles are stable in size and in the midline without mass effect or shift.  Normal configuration.  No extra-axial fluid collections.  No CT findings for acute hemispheric infarction and/or intracranial hemorrhage.  No mass lesions.  The brainstem and cerebellum are grossly normal and stable.  Dense vascular calcifications are noted.  The bony structures are intact.  The paranasal sinuses and mastoid air cells are clear.  IMPRESSION:  1.  Stable advanced periventricular white matter disease. 2.  No acute intracranial findings or mass lesion.   Original Report Authenticated By: Marijo Sanes, M.D.     Results for Loewenstein, MAHATI HARVAN (MRN [id number]) as of 06/07/2012 18:20  Ref. Range 05/07/2012 12:24 05/08/2012 04:50 06/07/2012 16:42  Sodium Latest Range: 135-145 mEq/L 136 136 129 (L)  BUN Latest Range: 6-23 mg/dL 24 (H) 32 (H) 47 (H)  Creatinine Latest Range: 0.50-1.10 mg/dL 1.17 (H) 1.50 (H) 1.81 (H)     1810:  APAP given for fever.  +UTI, UC pending, BC x2 pending. Will tx with IV rocephin.  BUN/Cr elevated from baseline.  New hyponatremia. Dx and testing d/w pt and family.  Questions answered.  Verb understanding, agreeable to admit.  T/C to [facility] Dr. Laren Everts, case discussed, including:  HPI, pertinent PM/SHx, VS/PE, dx testing, ED course and treatment:  Agreeable to observation admit.       Alfonzo Feller, DO 06/08/12 2132

## 2012-06-07 NOTE — ED Notes (Signed)
Pt had a kideny stent removed on mon and was given pain medication daughter states that she is confused and sleeping all day not acting her normal. Pt alert x4. Denies any pain

## 2012-06-07 NOTE — H&P (Addendum)
[facility]                                                                                    Patient Demographics  [name] [name], is a 72 y.o. female  CSN: [id number]  MRN: [id number]  DOB - December 19, 1940  Admit Date - 06/07/2012  Outpatient Primary MD for the patient is [name], MD   With History of -  Past Medical History  Diagnosis Date  . Diabetes mellitus without complication   . CHF (congestive heart failure)   . Coronary artery disease   . AAA (abdominal aortic aneurysm) 03/09/2012  . PVD (peripheral vascular disease)   . Carotid artery stenosis   . Hypertension   . Hyperlipidemia   . Hypothyroidism       Past Surgical History  Procedure Laterality Date  . Cardiac surgery    . Abdominal hysterectomy    . Tumor removal    . Coronary stent placement    . Femoral artery repair    . Cystoscopy w/ ureteral stent placement  03/10/2012    Procedure: CYSTOSCOPY WITH RETROGRADE PYELOGRAM/URETERAL STENT PLACEMENT;  Surgeon: Hanley Ben, MD;  Location: [facility];  Service: Urology;  Laterality: Left;    in for   Chief Complaint  Patient presents with  . Altered Mental Status     HPI  [name] [name]  is a 72 y.o. female, with history of left hydronephrosis status post placement of stent January 2014, removed  on Monday, presenting with on and off episodes of confusion. Patient denies any chest pains or shortness of breath. She had episodes of fever and chills. No blood in stool or urine. In the emergency room the patient was found to have a UTI, placed on Rocephin, and I was called to admit. The family reports decreased by mouth intake lately and when the daughter tried to call her she doesn't know the date. Her urologist is Dr. Janice Norrie .    Review of Systems    In addition to the HPI above,   No Headache, No changes with Vision or hearing, No problems swallowing food or Liquids, No Chest pain, Cough or Shortness of Breath, Mild left-sided Abdominal  pain, Nausea but no Vomitting, Bowel movements are regular, No Blood in stool or Urine, No dysuria, No new skin rashes or bruises, No new joints pains-aches,  No new weakness, tingling, numbness in any extremity, No recent weight gain or loss, No polyuria, polydypsia or polyphagia, No significant Mental Stressors.  A full 10 point Review of Systems was done, except as stated above, all other Review of Systems were negative.   Social History History  Substance Use Topics  . Smoking status: Former Smoker -- 2.00 packs/day for 25 years    Quit date: 06/07/2009  . Smokeless tobacco: Never Used  . Alcohol Use: No     Family History Family History  Problem Relation Age of Onset  . Congestive Heart Failure Mother   . Diabetes Mother   . Stroke Father      Prior to Admission medications   Medication Sig Start Date End Date Taking? Authorizing Provider  aspirin 81 MG chewable tablet Chew  81 mg by mouth at bedtime.    Yes Historical Provider, MD  calcium-vitamin D (OSCAL WITH D) 500-200 MG-UNIT per tablet Take 1 tablet by mouth every morning.   Yes Historical Provider, MD  Choline Fenofibrate (TRILIPIX) 135 MG capsule Take 135 mg by mouth at bedtime.   Yes Historical Provider, MD  cilostazol (PLETAL) 100 MG tablet Take 100 mg by mouth 2 (two) times daily.   Yes Historical Provider, MD  clopidogrel (PLAVIX) 75 MG tablet Take 75 mg by mouth every morning.    Yes Historical Provider, MD  docusate sodium (COLACE) 100 MG capsule Take 100 mg by mouth 2 (two) times daily as needed for constipation.   Yes Historical Provider, MD  ferrous sulfate 325 (65 FE) MG tablet Take 325 mg by mouth at bedtime.    Yes Historical Provider, MD  furosemide (LASIX) 20 MG tablet Take 1 tablet (20 mg total) by mouth daily. 05/08/12  Yes Janece Canterbury, MD  HYDROcodone-acetaminophen (VICODIN) 5-500 MG per tablet Take 2 tablets by mouth at bedtime.   Yes Historical Provider, MD  insulin aspart (NOVOLOG) 100  UNIT/ML injection Inject 8 Units into the skin daily. Right before lunch.   Yes Historical Provider, MD  insulin detemir (LEVEMIR) 100 UNIT/ML injection Inject 16 Units into the skin at bedtime.   Yes Historical Provider, MD  levothyroxine (SYNTHROID, LEVOTHROID) 137 MCG tablet Take 137 mcg by mouth daily before breakfast.   Yes Historical Provider, MD  losartan (COZAAR) 25 MG tablet Take 25 mg by mouth daily.    Yes Historical Provider, MD  magnesium oxide (MAG-OX) 400 MG tablet Take 800 mg by mouth at bedtime.    Yes Historical Provider, MD  metFORMIN (GLUCOPHAGE-XR) 500 MG 24 hr tablet Take 1,000 mg by mouth at bedtime.   Yes Historical Provider, MD  metoprolol succinate (TOPROL-XL) 25 MG 24 hr tablet Take 25 mg by mouth every morning.    Yes Historical Provider, MD  nitrofurantoin, macrocrystal-monohydrate, (MACROBID) 100 MG capsule Take 100 mg by mouth 2 (two) times daily. 5 day course of therapy started 06/03/12.   Yes Historical Provider, MD  omeprazole (PRILOSEC) 20 MG capsule Take 20 mg by mouth daily.   Yes Historical Provider, MD  oxybutynin (DITROPAN) 5 MG tablet Take 5 mg by mouth 3 (three) times daily.   Yes Historical Provider, MD  vitamin B-12 (CYANOCOBALAMIN) 1000 MCG tablet Take 1,000 mcg by mouth every morning.   Yes Historical Provider, MD  vitamin C (ASCORBIC ACID) 500 MG tablet Take 500 mg by mouth every morning.   Yes Historical Provider, MD  Vitamin D, Ergocalciferol, (DRISDOL) 50000 UNITS CAPS Take 50,000 Units by mouth every 7 (seven) days. On thursdays   Yes Historical Provider, MD  rosuvastatin (CRESTOR) 20 MG tablet Take 20 mg by mouth daily.    Historical Provider, MD    No Known Allergies  Physical Exam  Vitals  Blood pressure 113/73, pulse 83, temperature 102.5 F (39.2 C), temperature source Rectal, SpO2 95.00%.   1. General white American female lying in bed, looks tired  2. Normal affect and insight, Not Suicidal or Homicidal, Awake Alert, pleasantly  confused.  3. No F.N deficits, ALL C.Nerves Intact, Strength 5/5 all 4 extremities, Sensation intact all 4 extremities, Plantars down going.  4. Ears and Eyes appear Normal, Conjunctivae clear, PERRLA. Dry mucous mucosa  5. Supple Neck, No JVD, No cervical lymphadenopathy appriciated, No Carotid Bruits.  6. Symmetrical Chest wall movement, Good air movement bilaterally, CTAB.  7. RRR, No Gallops, Rubs or Murmurs, No Parasternal Heave.  8. Positive Bowel Sounds, Abdomen Soft, mild left-sided tenderness, No organomegaly appriciated,No rebound -guarding or rigidity.  9.  No Cyanosis, Normal Skin Turgor, No Skin Rash or Bruise.  10. Good muscle tone,  joints appear normal , no effusions, Normal ROM.  11. No Palpable Lymph Nodes in Neck or Axillae    Data Review  CBC  Recent Labs Lab 06/07/12 1642  WBC 14.3*  HGB 11.8*  HCT 35.4*  PLT 225  MCV 92.2  MCH 30.7  MCHC 33.3  RDW 14.1  LYMPHSABS 0.6*  MONOABS 1.7*  EOSABS 0.0  BASOSABS 0.0   ------------------------------------------------------------------------------------------------------------------  Chemistries   Recent Labs Lab 06/07/12 1642  NA 129*  K 5.3*  CL 92*  CO2 26  GLUCOSE 193*  BUN 47*  CREATININE 1.81*  CALCIUM 9.1   ------------------------------------------------------------------------------------------------------------------ CrCl is unknown because both a height and weight (above a minimum accepted value) are required for this calculation. ------------------------------------------------------------------------------------------------------------------ No results found for this basename: TSH, T4TOTAL, FREET3, T3FREE, THYROIDAB,  in the last 72 hours   Coagulation profile No results found for this basename: INR, PROTIME,  in the last 168 hours ------------------------------------------------------------------------------------------------------------------- No results found for this  basename: DDIMER,  in the last 72 hours -------------------------------------------------------------------------------------------------------------------  Cardiac Enzymes  Recent Labs Lab 06/07/12 1642  TROPONINI <0.30   ------------------------------------------------------------------------------------------------------------------    ---------------------------------------------------------------------------------------------------------------  Urinalysis    Component Value Date/Time   COLORURINE AMBER* 06/07/2012 1656   APPEARANCEUR TURBID* 06/07/2012 1656   LABSPEC 1.025 06/07/2012 1656   PHURINE 6.0 06/07/2012 1656   GLUCOSEU NEGATIVE 06/07/2012 1656   HGBUR LARGE* 06/07/2012 [place] 06/07/2012 [place] 06/07/2012 1656   PROTEINUR >300* 06/07/2012 1656   UROBILINOGEN 1.0 06/07/2012 1656   NITRITE NEGATIVE 06/07/2012 1656   LEUKOCYTESUR LARGE* 06/07/2012 1656    ----------------------------------------------------------------------------------------------------------------     Imaging results:   Dg Chest 2 View  06/07/2012  *RADIOLOGY REPORT*  Clinical Data: Altered mental status.  CHEST - 2 VIEW  Comparison: 05/07/2012  Findings: Stable surgical changes.  The heart is normal in size. The mediastinal and hilar contours are within normal limits and unchanged.  The lungs are clear except for minimal streaky left basilar atelectasis and a very small left pleural effusion.  No pulmonary edema.  The bony thorax is intact.  IMPRESSION: Small left effusion with minimal overlying atelectasis.   Original Report Authenticated By: Marijo Sanes, M.D.    Ct Head Wo Contrast  06/07/2012  *RADIOLOGY REPORT*  Clinical Data: Confusion and disorientation.  CT HEAD WITHOUT CONTRAST  Technique:  Contiguous axial images were obtained from the base of the skull through the vertex without contrast.  Comparison: 03/08/2012.  Findings: Stable fairly advanced periventricular white  matter disease and remote lacunar type left caudate infarcts.  The ventricles are stable in size and in the midline without mass effect or shift.  Normal configuration.  No extra-axial fluid collections.  No CT findings for acute hemispheric infarction and/or intracranial hemorrhage.  No mass lesions.  The brainstem and cerebellum are grossly normal and stable.  Dense vascular calcifications are noted.  The bony structures are intact.  The paranasal sinuses and mastoid air cells are clear.  IMPRESSION:  1.  Stable advanced periventricular white matter disease. 2.  No acute intracranial findings or mass lesion.   Original Report Authenticated By: Marijo Sanes, M.D.     My personal review of EKG: Normal sinus  rhythm with left artery enlargement, ST changes noted no difference from previous EKG.    Assessment & Plan  1.  Urinary tract infection, patient has a history of left hydronephrosis status post stent placement and removal lately, followed by Dr. Janice Norrie. Patient complaining of left sided abdominal pain and mild nausea. Will admit for IV  Rocephin  , check left kidney ultrasound, already ordered. Consult Dr. Janice Norrie in a.m.  2. dehydration with mild renal insufficiency, start IV fluids and watch out for fluid overload  3. hyponatremia, patient started on IV normal saline  4. Hyperkalemia, patient given an episode of Kayexalate  5. Coronary artery disease, asymptomatic we'll continue with aspirin Plavix and beta blocker  6. Congestive heart failure, asymptomatic see. #5  7. Hypertension, seems to be controlled  8. Diabetes mellitus, will hold the levemir since she is not getting enough by mouth. F/U blood sugars  9. History of abdominal aortic aneurysm, peripheral vascular disease is symptomatic at this time   DVT Prophylaxis Heparin  AM Labs Ordered, also please review Full Orders  Family Communication: Admission, patients condition and plan of care including tests being ordered have  been discussed with the patient and daughter who indicate understanding and agree with the plan and Code Status.  Code Status full  Disposition Plan: Home with home health probably  Time spent in minutes : 45 minutes Condition GUARDED

## 2012-06-07 NOTE — ED Notes (Signed)
Per daughter pt is confused and disoriented since yesterday; daughter sts pt lives alone and is very independent at baseline, however daughter reports pt called her at 3:30 am this am and was disoriented. Daughter sts similar episode 3 mo. Ago when pt was diagnosed with kidney infection.

## 2012-06-07 NOTE — ED Notes (Signed)
Daughter, [name], was notified of pt's admission to room 1504.

## 2012-06-08 ENCOUNTER — Inpatient Hospital Stay (HOSPITAL_COMMUNITY): Payer: Medicare Other

## 2012-06-08 DIAGNOSIS — E860 Dehydration: Secondary | ICD-10-CM | POA: Diagnosis not present

## 2012-06-08 DIAGNOSIS — I5042 Chronic combined systolic (congestive) and diastolic (congestive) heart failure: Secondary | ICD-10-CM

## 2012-06-08 DIAGNOSIS — N179 Acute kidney failure, unspecified: Secondary | ICD-10-CM

## 2012-06-08 DIAGNOSIS — G9340 Encephalopathy, unspecified: Secondary | ICD-10-CM

## 2012-06-08 DIAGNOSIS — N390 Urinary tract infection, site not specified: Secondary | ICD-10-CM | POA: Diagnosis not present

## 2012-06-08 DIAGNOSIS — N189 Chronic kidney disease, unspecified: Secondary | ICD-10-CM

## 2012-06-08 DIAGNOSIS — N1330 Unspecified hydronephrosis: Secondary | ICD-10-CM | POA: Diagnosis not present

## 2012-06-08 LAB — BASIC METABOLIC PANEL
CO2: 23 mEq/L (ref 19–32)
Calcium: 8.6 mg/dL (ref 8.4–10.5)
Creatinine, Ser: 1.63 mg/dL — ABNORMAL HIGH (ref 0.50–1.10)
Glucose, Bld: 241 mg/dL — ABNORMAL HIGH (ref 70–99)

## 2012-06-08 MED ORDER — CIPROFLOXACIN HCL 500 MG PO TABS
500.0000 mg | ORAL_TABLET | Freq: Every day | ORAL | Status: DC
  Administered 2012-06-08 – 2012-06-10 (×3): 500 mg via ORAL
  Filled 2012-06-08 (×4): qty 1

## 2012-06-08 NOTE — Progress Notes (Addendum)
[facility] PROGRESS NOTE  [name] [name] [id number] DOB: 06-02-1940 DOA: 06/07/2012 PCP: [name], MD  Assessment/Plan: Acute metabolic encephalopathy -Likely due to UTI and dehydration -Improving with antibiotics and IV fluids -Appears to be back to baseline Dehydration/acute on chronic renal failure -Baseline creatinine 1.0-1.3 -Judicious IV fluids UTI/cystitis -Discontinue ceftriaxone -Start Cipro -Patient had Citrobacter resistant to ceftriaxone in Jan 2014 -Followup urine culture and adjust antibiotics Diabetes mellitus type 2 -Patient normally takes Levemir 16 units at bedtime -NovoLog sliding scale for now -Discontinue metformin in light of patient's CKD Hypertension -Continue metoprolol succinate -Discontinue this or not in the face of AKI Left-sided hydronephrosis -Status post removal 06/05/2012, Dr. Janice Norrie -06/08/2012 renal ultrasound shows fullness in the left renal pelvis without frank hydronephrosis Chronic systolic and diastolic heart failure -Compensated at this time -Ejection fraction 15% Coronary artery disease -Continue Plavix and aspirin Hyponatremia -Likely volume depletion -Hold furosemide History of abdominal aortic aneurysm, peripheral vascular disease -Continue Pletal    Family Communication:  Updated daughter Disposition Plan:   Home when medically stable    Antibiotics:  Ceftriaxone 06/07/2012>>> 06/08/2012  Ciprofloxacin 06/08/2012>>>    Procedures/Studies: Dg Chest 2 View  06/07/2012  *RADIOLOGY REPORT*  Clinical Data: Altered mental status.  CHEST - 2 VIEW  Comparison: 05/07/2012  Findings: Stable surgical changes.  The heart is normal in size. The mediastinal and hilar contours are within normal limits and unchanged.  The lungs are clear except for minimal streaky left basilar atelectasis and a very small left pleural effusion.  No pulmonary edema.  The bony thorax is intact.  IMPRESSION: Small left effusion with minimal  overlying atelectasis.   Original Report Authenticated By: Marijo Sanes, M.D.    Ct Head Wo Contrast  06/07/2012  *RADIOLOGY REPORT*  Clinical Data: Confusion and disorientation.  CT HEAD WITHOUT CONTRAST  Technique:  Contiguous axial images were obtained from the base of the skull through the vertex without contrast.  Comparison: 03/08/2012.  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  The ventricles are stable in size and in the midline without mass effect or shift.  Normal configuration.  No extra-axial fluid collections.  No CT findings for acute hemispheric infarction and/or intracranial hemorrhage.  No mass lesions.  The brainstem and cerebellum are grossly normal and stable.  Dense vascular calcifications are noted.  The bony structures are intact.  The paranasal sinuses and mastoid air cells are clear.  IMPRESSION:  1.  Stable advanced periventricular white matter disease. 2.  No acute intracranial findings or mass lesion.   Original Report Authenticated By: Marijo Sanes, M.D.    US Renal  06/08/2012  *RADIOLOGY REPORT*  Clinical Data: Left hydronephrosis, ureteral stent removed  RENAL/URINARY TRACT ULTRASOUND COMPLETE  Comparison:  Renal ultrasound dated 06/06/2011  Findings:  Right Kidney:  Measures 10.4 cm.  Cortical thinning. 10 x 9 x 9 mm probable upper pole cyst, although not simple by ultrasound.  No hydronephrosis.  Left Kidney:  Measures 11.1 cm.  Cortical thinning.  11 x 10 x 9 mm probable upper pole cyst, although not simple by ultrasound. Fullness of the renal pelvis without frank hydronephrosis.  Bladder:  Within normal limits.  Bilateral bladder jets are visualized.  IMPRESSION: Fullness of the left renal pelvis without frank hydronephrosis.  10 mm probable right upper pole cyst and 11 mm probable left upper pole cyst, neither of which are simple by ultrasound.   Original Report Authenticated By: Julian Hy,  M.D.  Subjective: Patient is feeling much better. She denies fevers, chills, chest pain, shortness of breath, nausea, vomiting, diarrhea, abdominal pain, dysuria, hematuria. Denies any headache, dizziness, flank pain.  Objective: Filed Vitals:   06/07/12 1847 06/07/12 2019 06/07/12 2141 06/08/12 0530  BP: 131/50 119/74 105/52 141/85  Pulse: 70 72 68 82  Temp: 100 F (37.8 C) 99.3 F (37.4 C) 99.1 F (37.3 C) 98.6 F (37 C)  TempSrc: Oral Oral Oral Oral  Resp: 20 20 18 20   SpO2: 98% 96% 96% 92%   No intake or output data in the 24 hours ending 06/08/12 1232 Weight change:  Exam:   General:  Pt is alert, follows commands appropriately, not in acute distress  HEENT: No icterus, No thrush,  [place]/AT  Cardiovascular: RRR, S1/S2, no rubs, no gallops  Respiratory: CTA bilaterally, no wheezing, no crackles, no rhonchi  Abdomen: Soft/+BS, non tender, non distended, no guarding  Extremities: No edema, No lymphangitis, No petechiae, No rashes, no synovitis  Data Reviewed: Basic Metabolic Panel:  Recent Labs Lab 06/07/12 1642 06/08/12 0453  NA 129* 131*  K 5.3* 3.6  CL 92* 96  CO2 26 23  GLUCOSE 193* 241*  BUN 47* 43*  CREATININE 1.81* 1.63*  CALCIUM 9.1 8.6   Liver Function Tests: No results found for this basename: AST, ALT, ALKPHOS, BILITOT, PROT, ALBUMIN,  in the last 168 hours No results found for this basename: LIPASE, AMYLASE,  in the last 168 hours No results found for this basename: AMMONIA,  in the last 168 hours CBC:  Recent Labs Lab 06/07/12 1642  WBC 14.3*  NEUTROABS 12.0*  HGB 11.8*  HCT 35.4*  MCV 92.2  PLT 225   Cardiac Enzymes:  Recent Labs Lab 06/07/12 1642  TROPONINI <0.30   BNP: No components found with this basename: POCBNP,  CBG:  Recent Labs Lab 06/08/12 0740 06/08/12 1118  GLUCAP 199* 258*    Recent Results (from the past 240 hour(s))  CULTURE, BLOOD (ROUTINE X 2)     Status: None   Collection Time     06/07/12  6:30 PM      Result Value Range Status   Specimen Description BLOOD LEFT ARM   Final   Special Requests BOTTLES DRAWN AEROBIC AND ANAEROBIC 5ML   Final   Culture  Setup Time 06/07/2012 23:27   Final   Culture     Final   Value:        BLOOD CULTURE RECEIVED NO GROWTH TO DATE CULTURE WILL BE HELD FOR 5 DAYS BEFORE ISSUING A FINAL NEGATIVE REPORT   Report Status PENDING   Incomplete  CULTURE, BLOOD (ROUTINE X 2)     Status: None   Collection Time    06/07/12  6:35 PM      Result Value Range Status   Specimen Description BLOOD LEFT ARM   Final   Special Requests BOTTLES DRAWN AEROBIC AND ANAEROBIC 5ML   Final   Culture  Setup Time 06/07/2012 23:27   Final   Culture     Final   Value:        BLOOD CULTURE RECEIVED NO GROWTH TO DATE CULTURE WILL BE HELD FOR 5 DAYS BEFORE ISSUING A FINAL NEGATIVE REPORT   Report Status PENDING   Incomplete     Scheduled Meds: . aspirin  81 mg Oral QHS  . cilostazol  100 mg Oral BID  . ciprofloxacin  500 mg Oral Q breakfast  . clopidogrel  75 mg Oral q  morning - 10a  . docusate sodium  100 mg Oral BID  . fenofibrate  54 mg Oral Daily  . ferrous sulfate  325 mg Oral QHS  . heparin  5,000 Units Subcutaneous Q8H  . insulin aspart  0-15 Units Subcutaneous TID WC  . levothyroxine  137 mcg Oral QAC breakfast  . metoprolol succinate  25 mg Oral q morning - 10a  . oxybutynin  5 mg Oral TID  . pantoprazole  40 mg Oral Daily  . vitamin B-12  1,000 mcg Oral q morning - 10a  . vitamin C  500 mg Oral q morning - 10a  . Vitamin D (Ergocalciferol)  50,000 Units Oral Q7 days   Continuous Infusions: . sodium chloride 75 mL/hr at 06/07/12 1731  . sodium chloride 75 mL/hr at 06/08/12 1000     [name] Kutsch, DO  [facility] Pager [phone]  If 7PM-7AM, please contact night-coverage [email or web address] Password [email or web address] 06/08/2012, 12:32 PM   LOS: 1 day

## 2012-06-08 NOTE — Clinical Documentation Improvement (Signed)
CHANGE MENTAL STATUS DOCUMENTATION CLARIFICATION   THIS DOCUMENT IS NOT A PERMANENT PART OF THE MEDICAL RECORD  TO RESPOND TO THE THIS QUERY, FOLLOW THE INSTRUCTIONS BELOW:  1. If needed, update documentation for the patient's encounter via the notes activity.  2. Access this query again and click edit on the In [place].  3. After updating, or not, click F2 to complete all highlighted (required) fields concerning your review. Select "additional documentation in the medical record" OR "no additional documentation provided".  4. Click Sign note button.  5. The deficiency will fall out of your In Basket *Please let us know if you are not able to complete this workflow by phone or e-mail (listed below).         06/08/12  Dear Dr. Carles Collet  [name] [name]  In an effort to better capture your patient's severity of illness, reflect appropriate length of stay and utilization of resources, a review of the patient medical record has revealed the following indicators.    Based on your clinical judgment, please clarify and document in a progress note and/or discharge summary the clinical condition associated with the following supporting information:  In responding to this query please exercise your independent judgment.  The fact that a query is asked, does not imply that any particular answer is desired or expected.  Possible Clinical Conditions?  Metabolic______Encephalopathy (describe type if known)                       Anoxic                       Septic                       Alcoholic                        Hepatic                       Hypertensive                       Metabolic                       _______ Toxic  _______Acute confusion _______Acute delirium _______Acute exacerbation of known dementia (indicate type) _______New diagnosis of Dementia, Alzheimer's, cerebral atherosclerosis    _______Other Condition__________________ _______Cannot Clinically Determine   Supporting  Information:  Risk Factors: Presents to ED with AMS,  UTI   Signs & Symptoms:ED note:"c/o gradual onset and persistence of intermittent episodes of "confusion" for the past  2 days"  Diagnostics: Lab: Na 131 (06/08/12)        129 (06/07/12)  Cl 92 (06/07/12) Renal US 06/07/12 :IMPRESSION:  Fullness of the left renal pelvis without frank hydronephrosis.  10 mm probable right upper pole cyst and 11 mm probable left upper  pole cyst, neither of which are simple by ultrasound.   CT Head 06/09/12:IMPRESSION:  1. Stable advanced periventricular white matter disease.  2. No acute intracranial findings or mass lesion.    Treatment :  Antibiotics to treat UTI  Reviewed: additional documentation in the medical record  Thank You,  Philippa Chester RN, BSN  Clinical Documentation Specialist:  Pager [phone] Office [phone] [place]

## 2012-06-08 NOTE — Progress Notes (Signed)
Inpatient Diabetes Program Recommendations  AACE/ADA: New Consensus Statement on Inpatient Glycemic Control (2013)  Target Ranges:  Prepandial:   less than 140 mg/dL      Peak postprandial:   less than 180 mg/dL (1-2 hours)      Critically ill patients:  140 - 180 mg/dL   Reason for Visit: Hyperglycemia  Results for Ikeda, DONNIESHA BOITNOTT (MRN [id number]) as of 06/08/2012 13:07  Ref. Range 06/08/2012 07:40 06/08/2012 11:18  Glucose-Capillary Latest Range: 70-99 mg/dL 199 (H) 258 (H)  Results for Kluesner, SARH MABERY (MRN [id number]) as of 06/08/2012 13:07  Ref. Range 05/07/2012 12:00  Hemoglobin A1C Latest Range: <5.7 % 6.0 (H)     Inpatient Diabetes Program Recommendations Insulin - Basal: Restart Levemir 8 units QHS (1/2 home dose of 16 units)  Note: Will continue to follow.  Thank you. Lorenda Peck, RD, LDN, CDE Inpatient Diabetes Coordinator [phone]

## 2012-06-09 DIAGNOSIS — G9340 Encephalopathy, unspecified: Secondary | ICD-10-CM | POA: Diagnosis not present

## 2012-06-09 DIAGNOSIS — R4182 Altered mental status, unspecified: Secondary | ICD-10-CM | POA: Diagnosis not present

## 2012-06-09 DIAGNOSIS — R509 Fever, unspecified: Secondary | ICD-10-CM | POA: Diagnosis not present

## 2012-06-09 DIAGNOSIS — N179 Acute kidney failure, unspecified: Secondary | ICD-10-CM | POA: Diagnosis not present

## 2012-06-09 LAB — GLUCOSE, CAPILLARY
Glucose-Capillary: 172 mg/dL — ABNORMAL HIGH (ref 70–99)
Glucose-Capillary: 188 mg/dL — ABNORMAL HIGH (ref 70–99)

## 2012-06-09 LAB — CBC
HCT: 30.2 % — ABNORMAL LOW (ref 36.0–46.0)
Hemoglobin: 10.3 g/dL — ABNORMAL LOW (ref 12.0–15.0)
MCH: 31.3 pg (ref 26.0–34.0)
MCV: 91.8 fL (ref 78.0–100.0)
RBC: 3.29 MIL/uL — ABNORMAL LOW (ref 3.87–5.11)

## 2012-06-09 LAB — BASIC METABOLIC PANEL
CO2: 25 mEq/L (ref 19–32)
Glucose, Bld: 175 mg/dL — ABNORMAL HIGH (ref 70–99)
Potassium: 3.2 mEq/L — ABNORMAL LOW (ref 3.5–5.1)
Sodium: 133 mEq/L — ABNORMAL LOW (ref 135–145)

## 2012-06-09 LAB — URINE CULTURE

## 2012-06-09 MED ORDER — POTASSIUM CHLORIDE CRYS ER 20 MEQ PO TBCR
30.0000 meq | EXTENDED_RELEASE_TABLET | Freq: Once | ORAL | Status: AC
  Administered 2012-06-09: 30 meq via ORAL
  Filled 2012-06-09: qty 1

## 2012-06-09 MED ORDER — INSULIN DETEMIR 100 UNIT/ML ~~LOC~~ SOLN
8.0000 [IU] | Freq: Every day | SUBCUTANEOUS | Status: DC
  Administered 2012-06-09 – 2012-06-11 (×3): 8 [IU] via SUBCUTANEOUS
  Filled 2012-06-09 (×4): qty 0.08

## 2012-06-09 NOTE — Progress Notes (Signed)
[facility] PROGRESS NOTE  [name] [name] [id number] DOB: October 23, 1940 DOA: 06/07/2012 PCP: [name], MD  Assessment/Plan: Acute metabolic encephalopathy  -Likely due to UTI and dehydration  -Improving with antibiotics and IV fluids  -Appears to be back to baseline, but has episode of sundowning -check B12 and RBC folate  Dehydration/acute on chronic renal failure  -improved -Baseline creatinine 1.0-1.3  -Judicious IV fluids-->saline lock today  UTI/cystitis  -Discontinue ceftriaxone  -Continue Cipro pending culture data -Patient had Citrobacter resistant to ceftriaxone in Jan 2014  -Followup urine culture and adjust antibiotics  Diabetes mellitus type 2  -Patient normally takes Levemir 16 units at bedtime  -NovoLog sliding scale for now  -Discontinue metformin in light of patient's CKD  -start Levemir 8 units q hs Hypertension  -Continue metoprolol succinate  -Discontinue losartan in the face of AKI  -BP controlled Left-sided hydronephrosis  -Status post removal 06/05/2012, Dr. Janice Norrie  -06/08/2012 renal ultrasound shows fullness in the left renal pelvis without frank hydronephrosis  Chronic systolic and diastolic heart failure  -Compensated at this time  -Ejection fraction 15%  Coronary artery disease  -Continue Plavix and aspirin  Hyponatremia  -Likely volume depletion  -Hold furosemide  History of abdominal aortic aneurysm, peripheral vascular disease  -Continue Pletal  Family Communication: Updated daughter  Disposition Plan: SNF when medically stable--daughter and pt now interested in SNF-->consulted PT   Antibiotics:  Ceftriaxone 06/07/2012>>> 06/08/2012  Ciprofloxacin 06/08/2012>>>      Procedures/Studies: Dg Chest 2 View  06/07/2012  *RADIOLOGY REPORT*  Clinical Data: Altered mental status.  CHEST - 2 VIEW  Comparison: 05/07/2012  Findings: Stable surgical changes.  The heart is normal in size. The mediastinal and hilar contours are within  normal limits and unchanged.  The lungs are clear except for minimal streaky left basilar atelectasis and a very small left pleural effusion.  No pulmonary edema.  The bony thorax is intact.  IMPRESSION: Small left effusion with minimal overlying atelectasis.   Original Report Authenticated By: Marijo Sanes, M.D.    Ct Head Wo Contrast  06/07/2012  *RADIOLOGY REPORT*  Clinical Data: Confusion and disorientation.  CT HEAD WITHOUT CONTRAST  Technique:  Contiguous axial images were obtained from the base of the skull through the vertex without contrast.  Comparison: 03/08/2012.  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  The ventricles are stable in size and in the midline without mass effect or shift.  Normal configuration.  No extra-axial fluid collections.  No CT findings for acute hemispheric infarction and/or intracranial hemorrhage.  No mass lesions.  The brainstem and cerebellum are grossly normal and stable.  Dense vascular calcifications are noted.  The bony structures are intact.  The paranasal sinuses and mastoid air cells are clear.  IMPRESSION:  1.  Stable advanced periventricular white matter disease. 2.  No acute intracranial findings or mass lesion.   Original Report Authenticated By: Marijo Sanes, M.D.    US Renal  06/08/2012  *RADIOLOGY REPORT*  Clinical Data: Left hydronephrosis, ureteral stent removed  RENAL/URINARY TRACT ULTRASOUND COMPLETE  Comparison:  Renal ultrasound dated 06/06/2011  Findings:  Right Kidney:  Measures 10.4 cm.  Cortical thinning. 10 x 9 x 9 mm probable upper pole cyst, although not simple by ultrasound.  No hydronephrosis.  Left Kidney:  Measures 11.1 cm.  Cortical thinning.  11 x 10 x 9 mm probable upper pole cyst, although not simple by ultrasound. Fullness of the renal pelvis without frank hydronephrosis.  Bladder:  Within normal limits.  Bilateral bladder jets are visualized.  IMPRESSION:  Fullness of the left renal pelvis without frank hydronephrosis.  10 mm probable right upper pole cyst and 11 mm probable left upper pole cyst, neither of which are simple by ultrasound.   Original Report Authenticated By: Julian Hy, M.D.          Subjective: Patient still has occasional episodes of confusion at nighttime. Overall much more lucid. Denies fevers, chills, chest pain, shortness of breath, nausea, vomiting, diarrhea, abdominal pain, dysuria.  Objective: Filed Vitals:   06/08/12 2126 06/09/12 0511 06/09/12 1044 06/09/12 1432  BP: 126/67 119/71 129/71 158/69  Pulse: 85 74 74 60  Temp: 99.2 F (37.3 C) 98.2 F (36.8 C)  97.8 F (36.6 C)  TempSrc: Oral Oral  Oral  Resp: 20 18  19   SpO2: 95% 96%  98%    Intake/Output Summary (Last 24 hours) at 06/09/12 1519 Last data filed at 06/09/12 1238  Gross per 24 hour  Intake 1622.5 ml  Output      0 ml  Net 1622.5 ml   Weight change:  Exam:   General:  Pt is alert, follows commands appropriately, not in acute distress  HEENT: No icterus, No thrush,  [place]/AT  Cardiovascular: RRR, S1/S2, no rubs, no gallops  Respiratory: Bibasilar crackles. No wheezes or rhonchi good air movement. Clear to auscultation  Abdomen: Soft/+BS, non tender, non distended, no guarding  Extremities: No edema, No lymphangitis, No petechiae, No rashes, no synovitis  Data Reviewed: Basic Metabolic Panel:  Recent Labs Lab 06/07/12 1642 06/08/12 0453 06/09/12 0506  NA 129* 131* 133*  K 5.3* 3.6 3.2*  CL 92* 96 99  CO2 26 23 25   GLUCOSE 193* 241* 175*  BUN 47* 43* 30*  CREATININE 1.81* 1.63* 1.43*  CALCIUM 9.1 8.6 7.9*   Liver Function Tests: No results found for this basename: AST, ALT, ALKPHOS, BILITOT, PROT, ALBUMIN,  in the last 168 hours No results found for this basename: LIPASE, AMYLASE,  in the last 168 hours No results found for this basename: AMMONIA,  in the last 168 hours CBC:  Recent Labs Lab 06/07/12 1642  06/09/12 0506  WBC 14.3* 8.1  NEUTROABS 12.0*  --   HGB 11.8* 10.3*  HCT 35.4* 30.2*  MCV 92.2 91.8  PLT 225 170   Cardiac Enzymes:  Recent Labs Lab 06/07/12 1642  TROPONINI <0.30   BNP: No components found with this basename: POCBNP,  CBG:  Recent Labs Lab 06/08/12 1711 06/08/12 2145 06/09/12 0518 06/09/12 0724 06/09/12 1118  GLUCAP 197* 188* 172* 172* 188*    Recent Results (from the past 240 hour(s))  URINE CULTURE     Status: None   Collection Time    06/07/12  4:56 PM      Result Value Range Status   Specimen Description URINE, CATHETERIZED   Final   Special Requests NONE   Final   Culture  Setup Time 06/08/2012 08:05   Final   Colony Count >=100,000 COLONIES/ML   Final   Culture ESCHERICHIA COLI   Final   Report Status PENDING   Incomplete  CULTURE, BLOOD (ROUTINE X 2)     Status: None   Collection Time    06/07/12  6:30 PM      Result Value Range Status   Specimen Description BLOOD LEFT ARM   Final   Special Requests BOTTLES DRAWN AEROBIC AND ANAEROBIC 5ML   Final   Culture  Setup Time 06/07/2012 23:27   Final   Culture     Final   Value:        BLOOD CULTURE RECEIVED NO GROWTH TO DATE CULTURE WILL BE HELD FOR 5 DAYS BEFORE ISSUING A FINAL NEGATIVE REPORT   Report Status PENDING   Incomplete  CULTURE, BLOOD (ROUTINE X 2)     Status: None   Collection Time    06/07/12  6:35 PM      Result Value Range Status   Specimen Description BLOOD LEFT ARM   Final   Special Requests BOTTLES DRAWN AEROBIC AND ANAEROBIC 5ML   Final   Culture  Setup Time 06/07/2012 23:27   Final   Culture     Final   Value:        BLOOD CULTURE RECEIVED NO GROWTH TO DATE CULTURE WILL BE HELD FOR 5 DAYS BEFORE ISSUING A FINAL NEGATIVE REPORT   Report Status PENDING   Incomplete  URINE CULTURE     Status: None   Collection Time    06/08/12  2:42 AM      Result Value Range Status   Specimen Description URINE, CLEAN CATCH   Final   Special Requests Normal   Final   Culture  Setup  Time 06/08/2012 09:25   Final   Colony Count PENDING   Incomplete   Culture Culture reincubated for better growth   Final   Report Status PENDING   Incomplete     Scheduled Meds: . aspirin  81 mg Oral QHS  . cilostazol  100 mg Oral BID  . ciprofloxacin  500 mg Oral Q breakfast  . clopidogrel  75 mg Oral q morning - 10a  . docusate sodium  100 mg Oral BID  . fenofibrate  54 mg Oral Daily  . ferrous sulfate  325 mg Oral QHS  . heparin  5,000 Units Subcutaneous Q8H  . insulin aspart  0-15 Units Subcutaneous TID WC  . insulin detemir  8 Units Subcutaneous QHS  . levothyroxine  137 mcg Oral QAC breakfast  . metoprolol succinate  25 mg Oral q morning - 10a  . oxybutynin  5 mg Oral TID  . pantoprazole  40 mg Oral Daily  . vitamin B-12  1,000 mcg Oral q morning - 10a  . vitamin C  500 mg Oral q morning - 10a  . Vitamin D (Ergocalciferol)  50,000 Units Oral Q7 days   Continuous Infusions:    Codylee Patil, DO  [facility] Pager [phone]  If 7PM-7AM, please contact night-coverage [email or web address] Password [email or web address] 06/09/2012, 3:19 PM   LOS: 2 days

## 2012-06-09 NOTE — Progress Notes (Signed)
Patient agreeable to go to rehab facility at dischasrge due to increased weakness.  Patient does not feel that she is strong enough to go home.

## 2012-06-10 DIAGNOSIS — R509 Fever, unspecified: Secondary | ICD-10-CM | POA: Diagnosis not present

## 2012-06-10 DIAGNOSIS — N179 Acute kidney failure, unspecified: Secondary | ICD-10-CM | POA: Diagnosis not present

## 2012-06-10 DIAGNOSIS — G9340 Encephalopathy, unspecified: Secondary | ICD-10-CM | POA: Diagnosis not present

## 2012-06-10 DIAGNOSIS — E860 Dehydration: Secondary | ICD-10-CM | POA: Diagnosis not present

## 2012-06-10 LAB — BASIC METABOLIC PANEL
BUN: 22 mg/dL (ref 6–23)
CO2: 22 mEq/L (ref 19–32)
Chloride: 105 mEq/L (ref 96–112)
Creatinine, Ser: 1.22 mg/dL — ABNORMAL HIGH (ref 0.50–1.10)
Glucose, Bld: 127 mg/dL — ABNORMAL HIGH (ref 70–99)

## 2012-06-10 LAB — GLUCOSE, CAPILLARY
Glucose-Capillary: 123 mg/dL — ABNORMAL HIGH (ref 70–99)
Glucose-Capillary: 171 mg/dL — ABNORMAL HIGH (ref 70–99)

## 2012-06-10 LAB — MAGNESIUM: Magnesium: 1.7 mg/dL (ref 1.5–2.5)

## 2012-06-10 MED ORDER — DEXTROSE 5 % IV SOLN
1.0000 g | INTRAVENOUS | Status: DC
  Administered 2012-06-10 – 2012-06-12 (×3): 1 g via INTRAVENOUS
  Filled 2012-06-10 (×3): qty 10

## 2012-06-10 NOTE — Evaluation (Signed)
Physical Therapy Evaluation Patient Details Name: [name] [name] MRN: [id number] DOB: 15-Dec-1940 Today's Date: 06/10/2012 Time: [id number] PT Time Calculation (min): 22 min  PT Assessment / Plan / Recommendation Clinical Impression  Pt. is 72 yo female admitted 06/07/12 with AMS, found to have UTI. Pt. lives alone in apt. Pt. is requesting to go to [facility] for rehab. Pt. will benefit from PT while in acute care.    PT Assessment  Patient needs continued PT services    Follow Up Recommendations  SNF    Does the patient have the potential to tolerate intense rehabilitation      Barriers to Discharge        Equipment Recommendations  None recommended by PT    Recommendations for Other Services     Frequency Min 3X/week    Precautions / Restrictions Precautions Precautions: Fall   Pertinent Vitals/Pain       Mobility  Bed Mobility Bed Mobility: Supine to Sit;Sit to Supine Supine to Sit: 7: Independent Sit to Supine: 7: Independent Transfers Transfers: Sit to Stand;Stand to Sit Sit to Stand: 4: Min guard;From bed;From toilet Stand to Sit: 5: Supervision;To bed;To toilet;With upper extremity assist Details for Transfer Assistance: used barbeside toilet. Ambulation/Gait Ambulation/Gait Assistance: 4: Min guard Ambulation Distance (Feet): 150 Feet Assistive device: Rolling walker Ambulation/Gait Assistance Details: steady gait, did ambulate 10 feet in room holding to objects. Gait Pattern: Step-through pattern    Exercises     PT Diagnosis: Difficulty walking;Generalized weakness  PT Problem List: Decreased balance;Decreased mobility;Decreased activity tolerance;Decreased knowledge of use of DME;Decreased safety awareness;Decreased knowledge of precautions PT Treatment Interventions: Functional mobility training;Therapeutic exercise;Therapeutic activities;Patient/family education   PT Goals Acute Rehab PT Goals PT Goal Formulation: With patient Time For Goal  Achievement: 06/24/12 Potential to Achieve Goals: Good Pt will go Sit to Stand: with modified independence PT Goal: Sit to Stand - Progress: Goal set today Pt will go Stand to Sit: with modified independence PT Goal: Stand to Sit - Progress: Goal set today Pt will Ambulate: >150 feet;with modified independence PT Goal: Ambulate - Progress: Goal set today  Visit Information  Last PT Received On: 06/10/12 Assistance Needed: +1    Subjective Data  Subjective: I know I need to go to rehab. I can't get well. Patient Stated Goal: to go to rehab   Prior [place] Lives With: Alone Type of Home: Apartment Home Access: Stairs to enter Entrance Stairs-Number of Steps: 1 (curb) Home Layout: One level Bathroom Shower/Tub: Chiropodist: Standard Home Adaptive Equipment: Bedside commode/3-in-1;Grab bars in shower;Hand-held shower hose;Tub transfer bench;Walker - rolling;Walker - four wheeled Prior Function Level of Independence: Independent with assistive device(s) Driving: Yes Communication Communication: No difficulties    Cognition  Cognition Overall Cognitive Status: Appears within functional limits for tasks assessed/performed Arousal/Alertness: Awake/alert Orientation Level: Appears intact for tasks assessed Behavior During Session: [facility] for tasks performed    Extremity/Trunk Assessment Right Upper Extremity Assessment RUE ROM/Strength/Tone: Within functional levels Left Upper Extremity Assessment LUE ROM/Strength/Tone: Within functional levels Right Lower Extremity Assessment RLE ROM/Strength/Tone: Within functional levels Left Lower Extremity Assessment LLE ROM/Strength/Tone: Within functional levels   Balance Balance Balance Assessed: Yes Static Standing Balance Static Standing - Balance Support: No upper extremity supported Static Standing - Level of Assistance: 5: Stand by assistance Static Standing - Comment/# of Minutes: performed  personal hygiene after toileting.  End of Session PT - End of Session Activity Tolerance: Patient tolerated treatment well Patient left: in bed;with  call bell/phone within reach  GP     Claretha Cooper 06/10/2012, 3:31 PM Tresa Endo PT [phone]

## 2012-06-10 NOTE — Progress Notes (Signed)
[facility] PROGRESS NOTE  [name] [name] [id number] DOB: 12-12-1940 DOA: 06/07/2012 PCP: [name], MD  Assessment/Plan: Acute metabolic encephalopathy  -Likely due to UTI and dehydration  -Improving with antibiotics and IV fluids  -Appears to be back to baseline, but has episode of sundowning  -check B12--1601 -RBC folate pending Dehydration/acute on chronic renal failure  -improved  -Baseline creatinine 1.0-1.3  -Judicious IV fluids-->saline lock today (06/09/12) UTI/cystitis--E.coli -Restart ceftriaxone  -Discontinue Cipro  Diabetes mellitus type 2  -Patient normally takes Levemir 16 units at bedtime  -NovoLog sliding scale for now  -Discontinue metformin in light of patient's CKD  -continue Levemir 8 units q hs  Hypertension  -Continue metoprolol succinate  -Discontinue losartan in the face of AKI  -BP controlled  Left-sided hydronephrosis  -Status post removal 06/05/2012, Dr. Janice Norrie  -06/08/2012 renal ultrasound shows fullness in the left renal pelvis without frank hydronephrosis  Chronic systolic and diastolic heart failure  -Compensated at this time  -Ejection fraction 15%  Coronary artery disease  -Continue Plavix and aspirin  Hyponatremia  -Likely volume depletion  -Hold furosemide  History of abdominal aortic aneurysm, peripheral vascular disease  -Continue Pletal  Family Communication: Updated daughter at bedside  Disposition Plan: SNF when medically stable--daughter and pt now interested in SNF-->consulted PT  Antibiotics:  Ceftriaxone 06/07/2012>>> 06/08/2012  Ceftriaxone 06/10/12>>> Ciprofloxacin 06/08/2012>>>06/10/12           Procedures/Studies: Dg Chest 2 View  06/07/2012  *RADIOLOGY REPORT*  Clinical Data: Altered mental status.  CHEST - 2 VIEW  Comparison: 05/07/2012  Findings: Stable surgical changes.  The heart is normal in size. The mediastinal and hilar contours are within normal limits and unchanged.  The lungs are clear except  for minimal streaky left basilar atelectasis and a very small left pleural effusion.  No pulmonary edema.  The bony thorax is intact.  IMPRESSION: Small left effusion with minimal overlying atelectasis.   Original Report Authenticated By: Marijo Sanes, M.D.    Ct Head Wo Contrast  06/07/2012  *RADIOLOGY REPORT*  Clinical Data: Confusion and disorientation.  CT HEAD WITHOUT CONTRAST  Technique:  Contiguous axial images were obtained from the base of the skull through the vertex without contrast.  Comparison: 03/08/2012.  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  The ventricles are stable in size and in the midline without mass effect or shift.  Normal configuration.  No extra-axial fluid collections.  No CT findings for acute hemispheric infarction and/or intracranial hemorrhage.  No mass lesions.  The brainstem and cerebellum are grossly normal and stable.  Dense vascular calcifications are noted.  The bony structures are intact.  The paranasal sinuses and mastoid air cells are clear.  IMPRESSION:  1.  Stable advanced periventricular white matter disease. 2.  No acute intracranial findings or mass lesion.   Original Report Authenticated By: Marijo Sanes, M.D.    US Renal  06/08/2012  *RADIOLOGY REPORT*  Clinical Data: Left hydronephrosis, ureteral stent removed  RENAL/URINARY TRACT ULTRASOUND COMPLETE  Comparison:  Renal ultrasound dated 06/06/2011  Findings:  Right Kidney:  Measures 10.4 cm.  Cortical thinning. 10 x 9 x 9 mm probable upper pole cyst, although not simple by ultrasound.  No hydronephrosis.  Left Kidney:  Measures 11.1 cm.  Cortical thinning.  11 x 10 x 9 mm probable upper pole cyst, although not simple by ultrasound. Fullness of the renal pelvis without frank hydronephrosis.  Bladder:  Within normal limits.  Bilateral bladder  jets are visualized.  IMPRESSION: Fullness of the left renal pelvis without frank hydronephrosis.   10 mm probable right upper pole cyst and 11 mm probable left upper pole cyst, neither of which are simple by ultrasound.   Original Report Authenticated By: Julian Hy, M.D.          Subjective: Patient is feeling well. She denies any fevers, chills, chest pain, shortness breath, nausea, vomiting, diarrhea, abdominal pain, dysuria, hematuria. No rashes.  Objective: Filed Vitals:   06/09/12 2200 06/09/12 2230 06/10/12 0612 06/10/12 0738  BP: 176/68 156/62 143/70   Pulse: 69  68   Temp: 98.1 F (36.7 C)  97.8 F (36.6 C)   TempSrc: Oral  Oral   Resp: 18     Height:    5\' 7"  (1.702 m)  Weight:    78.881 kg (173 lb 14.4 oz)  SpO2: 100%  95%     Intake/Output Summary (Last 24 hours) at 06/10/12 1311 Last data filed at 06/09/12 1813  Gross per 24 hour  Intake    240 ml  Output      0 ml  Net    240 ml   Weight change:  Exam:   General:  Pt is alert, follows commands appropriately, not in acute distress  HEENT: No icterus, No thrush,  [place]/AT  Cardiovascular: RRR, S1/S2, no rubs, no gallops  Respiratory: CTA bilaterally, no wheezing, no crackles, no rhonchi  Abdomen: Soft/+BS, non tender, non distended, no guarding  Extremities: No edema, No lymphangitis, No petechiae, No rashes, no synovitis  Data Reviewed: Basic Metabolic Panel:  Recent Labs Lab 06/07/12 1642 06/08/12 0453 06/09/12 0506 06/10/12 0521  NA 129* 131* 133* 137  K 5.3* 3.6 3.2* 3.9  CL 92* 96 99 105  CO2 26 23 25 22   GLUCOSE 193* 241* 175* 127*  BUN 47* 43* 30* 22  CREATININE 1.81* 1.63* 1.43* 1.22*  CALCIUM 9.1 8.6 7.9* 8.4  MG  --   --   --  1.7   Liver Function Tests: No results found for this basename: AST, ALT, ALKPHOS, BILITOT, PROT, ALBUMIN,  in the last 168 hours No results found for this basename: LIPASE, AMYLASE,  in the last 168 hours No results found for this basename: AMMONIA,  in the last 168 hours CBC:  Recent Labs Lab 06/07/12 1642 06/09/12 0506  WBC 14.3* 8.1   NEUTROABS 12.0*  --   HGB 11.8* 10.3*  HCT 35.4* 30.2*  MCV 92.2 91.8  PLT 225 170   Cardiac Enzymes:  Recent Labs Lab 06/07/12 1642  TROPONINI <0.30   BNP: No components found with this basename: POCBNP,  CBG:  Recent Labs Lab 06/09/12 1118 06/09/12 1625 06/09/12 2130 06/10/12 0726 06/10/12 1124  GLUCAP 188* 140* 115* 123* 171*    Recent Results (from the past 240 hour(s))  URINE CULTURE     Status: None   Collection Time    06/07/12  4:56 PM      Result Value Range Status   Specimen Description URINE, CATHETERIZED   Final   Special Requests NONE   Final   Culture  Setup Time 06/08/2012 08:05   Final   Colony Count >=100,000 COLONIES/ML   Final   Culture ESCHERICHIA COLI   Final   Report Status 06/09/2012 FINAL   Final   Organism ID, Bacteria ESCHERICHIA COLI   Final  CULTURE, BLOOD (ROUTINE X 2)     Status: None   Collection Time    06/07/12  6:30 PM      Result Value Range Status   Specimen Description BLOOD LEFT ARM   Final   Special Requests BOTTLES DRAWN AEROBIC AND ANAEROBIC 5ML   Final   Culture  Setup Time 06/07/2012 23:27   Final   Culture     Final   Value:        BLOOD CULTURE RECEIVED NO GROWTH TO DATE CULTURE WILL BE HELD FOR 5 DAYS BEFORE ISSUING A FINAL NEGATIVE REPORT   Report Status PENDING   Incomplete  CULTURE, BLOOD (ROUTINE X 2)     Status: None   Collection Time    06/07/12  6:35 PM      Result Value Range Status   Specimen Description BLOOD LEFT ARM   Final   Special Requests BOTTLES DRAWN AEROBIC AND ANAEROBIC 5ML   Final   Culture  Setup Time 06/07/2012 23:27   Final   Culture     Final   Value:        BLOOD CULTURE RECEIVED NO GROWTH TO DATE CULTURE WILL BE HELD FOR 5 DAYS BEFORE ISSUING A FINAL NEGATIVE REPORT   Report Status PENDING   Incomplete  URINE CULTURE     Status: None   Collection Time    06/08/12  2:42 AM      Result Value Range Status   Specimen Description URINE, CLEAN CATCH   Final   Special Requests Normal    Final   Culture  Setup Time 06/08/2012 09:25   Final   Colony Count 40,000 COLONIES/ML   Final   Culture ENTEROCOCCUS SPECIES   Final   Report Status PENDING   Incomplete     Scheduled Meds: . aspirin  81 mg Oral QHS  . cefTRIAXone (ROCEPHIN)  IV  1 g Intravenous Q24H  . cilostazol  100 mg Oral BID  . clopidogrel  75 mg Oral q morning - 10a  . docusate sodium  100 mg Oral BID  . fenofibrate  54 mg Oral Daily  . ferrous sulfate  325 mg Oral QHS  . heparin  5,000 Units Subcutaneous Q8H  . insulin aspart  0-15 Units Subcutaneous TID WC  . insulin detemir  8 Units Subcutaneous QHS  . levothyroxine  137 mcg Oral QAC breakfast  . metoprolol succinate  25 mg Oral q morning - 10a  . oxybutynin  5 mg Oral TID  . pantoprazole  40 mg Oral Daily  . vitamin B-12  1,000 mcg Oral q morning - 10a  . vitamin C  500 mg Oral q morning - 10a  . Vitamin D (Ergocalciferol)  50,000 Units Oral Q7 days   Continuous Infusions:    Milferd Ansell, DO  [facility] Pager [phone]  If 7PM-7AM, please contact night-coverage [email or web address] Password [email or web address] 06/10/2012, 1:11 PM   LOS: 3 days

## 2012-06-11 DIAGNOSIS — N179 Acute kidney failure, unspecified: Secondary | ICD-10-CM | POA: Diagnosis not present

## 2012-06-11 DIAGNOSIS — E119 Type 2 diabetes mellitus without complications: Secondary | ICD-10-CM | POA: Diagnosis not present

## 2012-06-11 DIAGNOSIS — G9340 Encephalopathy, unspecified: Secondary | ICD-10-CM | POA: Diagnosis not present

## 2012-06-11 DIAGNOSIS — I2510 Atherosclerotic heart disease of native coronary artery without angina pectoris: Secondary | ICD-10-CM

## 2012-06-11 LAB — BASIC METABOLIC PANEL
Calcium: 8.9 mg/dL (ref 8.4–10.5)
GFR calc non Af Amer: 47 mL/min — ABNORMAL LOW (ref >90)
Glucose, Bld: 170 mg/dL — ABNORMAL HIGH (ref 70–99)
Sodium: 134 mEq/L — ABNORMAL LOW (ref 135–145)

## 2012-06-11 LAB — URINE CULTURE: Colony Count: 40000

## 2012-06-11 LAB — GLUCOSE, CAPILLARY: Glucose-Capillary: 133 mg/dL — ABNORMAL HIGH (ref 70–99)

## 2012-06-11 MED ORDER — GLUCERNA SHAKE PO LIQD
237.0000 mL | Freq: Three times a day (TID) | ORAL | Status: DC
  Administered 2012-06-11 – 2012-06-12 (×2): 237 mL via ORAL
  Filled 2012-06-11 (×4): qty 237

## 2012-06-11 NOTE — Progress Notes (Signed)
Clinical Social Work Department CLINICAL SOCIAL WORK PLACEMENT NOTE 06/11/2012  Patient:  [name] [name], [name] [name]  Account Number:  [id number] Admit date:  06/07/2012  Clinical Social Worker:  Levie Heritage  Date/time:  06/11/2012 03:40 PM  Clinical Social Work is seeking post-discharge placement for this patient at the following level of care:   SKILLED NURSING   (*CSW will update this form in Epic as items are completed)   06/11/2012  Patient/family provided with [facility] Department of Clinical Social Work's list of facilities offering this level of care within the geographic area requested by the patient (or if unable, by the patient's family).  06/11/2012  Patient/family informed of their freedom to choose among providers that offer the needed level of care, that participate in Medicare, Medicaid or managed care program needed by the patient, have an available bed and are willing to accept the patient.  06/11/2012  Patient/family informed of [facility]' ownership interest in [facility], as well as of the fact that they are under no obligation to receive care at this facility.  PASARR submitted to EDS on 06/11/2012 PASARR number received from [employer] on 06/11/2012  FL2 transmitted to all facilities in geographic area requested by pt/family on  06/11/2012 FL2 transmitted to all facilities within larger geographic area on   Patient informed that his/her managed care company has contracts with or will negotiate with  certain facilities, including the following:     Patient/family informed of bed offers received:   Patient chooses bed at  Physician recommends and patient chooses bed at    Patient to be transferred to  on   Patient to be transferred to facility by   The following physician request were entered in Epic:   Additional Comments:  Bernita Raisin, [place] Work [phone]

## 2012-06-11 NOTE — Progress Notes (Signed)
Clinical Social Work Department BRIEF PSYCHOSOCIAL ASSESSMENT 06/11/2012  Patient:  [name] [name], [name] [name]     Account Number:  [id number]     Admit date:  06/07/2012  Clinical Social Worker:  Levie Heritage  Date/Time:  06/11/2012 03:34 PM  Referred by:  Physician  Date Referred:  06/11/2012 Referred for  SNF Placement   Other Referral:   Interview type:  Patient Other interview type:   Pt's daughter at bedside    PSYCHOSOCIAL DATA Living Status:  ALONE Admitted from facility:   Level of care:   Primary support name:  [name] [name] Primary support relationship to patient:  CHILD, ADULT Degree of support available:   strong    CURRENT CONCERNS Current Concerns  Post-Acute Placement   Other Concerns:    SOCIAL WORK ASSESSMENT / PLAN Met with Pt and daughter to discuss d/c plans.    Pt stated that she'd like to go to a SNF due to multiple hospitalizations.  She feels that she needs a brief stint to keep herself well.    CSW answered SNF questions and explained SNF process.  Pt and daughter had SNF list and are reviewing SNFs on the computer.    Pt asked that daughter be contacted, as well, with bed offers.    Pt understands that d/c is likely for tomorrow and that a decision will need to be made at that time.  Pt and daughter hopeful for [facility] in [place].    CSW thanked Pt and daughter for their time.   Assessment/plan status:  Psychosocial Support/Ongoing Assessment of Needs Other assessment/ plan:   Information/referral to community resources:   SNF list given by [facility]    PATIENT'S/FAMILY'S RESPONSE TO PLAN OF CARE: Pt and daughter thanked CSW for time and assistance.   Bernita Raisin, [place] Work [phone]

## 2012-06-11 NOTE — Progress Notes (Signed)
[facility] PROGRESS NOTE  [name] [name] [id number] DOB: [birth year] DOA: 06/07/2012 PCP: [name], MD  Assessment/Plan: Acute metabolic encephalopathy  -Likely due to UTI and dehydration  -Improving with antibiotics and IV fluids  -Appears to be back to baseline, but has episode of sundowning  -check B12--1601  -RBC folate pending  Dehydration/acute on chronic renal failure  -improved  -Baseline creatinine 1.0-1.3  -Judicious IV fluids-->saline lock today (06/09/12)  UTI/cystitis--E.coli  -Restart ceftriaxone  -Discontinue Cipro  -Enterococcus in urine is not clinically significant, would not treat with antibiotics Diabetes mellitus type 2  -Patient normally takes Levemir 16 units at bedtime  -NovoLog sliding scale for now  -Discontinue metformin in light of patient's CKD  -continue Levemir 8 units q hs--> CBGs remained controlled Hypertension  -Continue metoprolol succinate  -Discontinue losartan in the face of AKI  -BP controlled  -Plan to restart Glucerna at the time of discharge Left-sided hydronephrosis  -Status post removal 06/05/2012, Dr. Janice Norrie  -06/08/2012 renal ultrasound shows fullness in the left renal pelvis without frank hydronephrosis  Chronic systolic and diastolic heart failure  -Compensated at this time  -Ejection fraction 15%  Coronary artery disease  -Continue Plavix and aspirin  Hyponatremia  -Likely volume depletion  -Hold furosemide  History of abdominal aortic aneurysm, peripheral vascular disease  -Continue Pletal  Family Communication: Updated daughter at bedside  Disposition Plan: SNF 06/12/12 if stable Antibiotics:  Ceftriaxone 06/07/2012>>> 06/08/2012  Ceftriaxone 06/10/12>>>  Ciprofloxacin 06/08/2012>>>06/10/12           Procedures/Studies: Dg Chest 2 View  06/07/2012  *RADIOLOGY REPORT*  Clinical Data: Altered mental status.  CHEST - 2 VIEW  Comparison: 05/07/2012  Findings: Stable surgical changes.  The heart is normal  in size. The mediastinal and hilar contours are within normal limits and unchanged.  The lungs are clear except for minimal streaky left basilar atelectasis and a very small left pleural effusion.  No pulmonary edema.  The bony thorax is intact.  IMPRESSION: Small left effusion with minimal overlying atelectasis.   Original Report Authenticated By: Marijo Sanes, M.D.    Ct Head Wo Contrast  06/07/2012  *RADIOLOGY REPORT*  Clinical Data: Confusion and disorientation.  CT HEAD WITHOUT CONTRAST  Technique:  Contiguous axial images were obtained from the base of the skull through the vertex without contrast.  Comparison: 03/08/2012.  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  The ventricles are stable in size and in the midline without mass effect or shift.  Normal configuration.  No extra-axial fluid collections.  No CT findings for acute hemispheric infarction and/or intracranial hemorrhage.  No mass lesions.  The brainstem and cerebellum are grossly normal and stable.  Dense vascular calcifications are noted.  The bony structures are intact.  The paranasal sinuses and mastoid air cells are clear.  IMPRESSION:  1.  Stable advanced periventricular white matter disease. 2.  No acute intracranial findings or mass lesion.   Original Report Authenticated By: Marijo Sanes, M.D.    US Renal  06/08/2012  *RADIOLOGY REPORT*  Clinical Data: Left hydronephrosis, ureteral stent removed  RENAL/URINARY TRACT ULTRASOUND COMPLETE  Comparison:  Renal ultrasound dated 06/06/2011  Findings:  Right Kidney:  Measures 10.4 cm.  Cortical thinning. 10 x 9 x 9 mm probable upper pole cyst, although not simple by ultrasound.  No hydronephrosis.  Left Kidney:  Measures 11.1 cm.  Cortical thinning.  11 x 10 x 9 mm probable upper pole cyst, although not  simple by ultrasound. Fullness of the renal pelvis without frank hydronephrosis.  Bladder:  Within normal limits.   Bilateral bladder jets are visualized.  IMPRESSION: Fullness of the left renal pelvis without frank hydronephrosis.  10 mm probable right upper pole cyst and 11 mm probable left upper pole cyst, neither of which are simple by ultrasound.   Original Report Authenticated By: Julian Hy, M.D.          Subjective: Patient continues to feel well. She denies fevers, chills, chest pain, shortness breath, nausea, vomiting, diarrhea, abdominal pain. She has some bleeding from the injection sites of the heparin. She is requesting switch to SCDs. No hematuria. No abdominal pain except at the sites of the ecchymoses.  Objective: Filed Vitals:   06/10/12 2200 06/11/12 0520 06/11/12 1113 06/11/12 1451  BP: 187/73 121/67 166/70 155/76  Pulse: 72 66  69  Temp: 98 F (36.7 C) 98.1 F (36.7 C)  97.3 F (36.3 C)  TempSrc: Oral Oral  Oral  Resp: 20 20  20   Height:      Weight:      SpO2: 98% 98%  99%    Intake/Output Summary (Last 24 hours) at 06/11/12 1749 Last data filed at 06/11/12 1739  Gross per 24 hour  Intake    480 ml  Output      0 ml  Net    480 ml   Weight change:  Exam:   General:  Pt is alert, follows commands appropriately, not in acute distress  HEENT: No icterus, No thrush,  [place]/AT  Cardiovascular: RRR, S1/S2, no rubs, no gallops  Respiratory: CTA bilaterally, no wheezing, no crackles, no rhonchi  Abdomen: Soft/+BS, non tender, non distended, no guarding; scattered ecchymoses in the bilateral lower quadrants. No active bleeding noted. No lymphangitis.  Extremities: trace edema, No lymphangitis, No petechiae, No rashes, no synovitis  Data Reviewed: Basic Metabolic Panel:  Recent Labs Lab 06/07/12 1642 06/08/12 0453 06/09/12 0506 06/10/12 0521 06/11/12 0518  NA 129* 131* 133* 137 134*  K 5.3* 3.6 3.2* 3.9 4.0  CL 92* 96 99 105 105  CO2 26 23 25 22 22   GLUCOSE 193* 241* 175* 127* 170*  BUN 47* 43* 30* 22 17  CREATININE 1.81* 1.63* 1.43* 1.22* 1.15*   CALCIUM 9.1 8.6 7.9* 8.4 8.9  MG  --   --   --  1.7  --    Liver Function Tests: No results found for this basename: AST, ALT, ALKPHOS, BILITOT, PROT, ALBUMIN,  in the last 168 hours No results found for this basename: LIPASE, AMYLASE,  in the last 168 hours No results found for this basename: AMMONIA,  in the last 168 hours CBC:  Recent Labs Lab 06/07/12 1642 06/09/12 0506  WBC 14.3* 8.1  NEUTROABS 12.0*  --   HGB 11.8* 10.3*  HCT 35.4* 30.2*  MCV 92.2 91.8  PLT 225 170   Cardiac Enzymes:  Recent Labs Lab 06/07/12 1642  TROPONINI <0.30   BNP: No components found with this basename: POCBNP,  CBG:  Recent Labs Lab 06/10/12 1650 06/10/12 2123 06/11/12 0718 06/11/12 1114 06/11/12 1701  GLUCAP 118* 137* 157* 133* 158*    Recent Results (from the past 240 hour(s))  URINE CULTURE     Status: None   Collection Time    06/07/12  4:56 PM      Result Value Range Status   Specimen Description URINE, CATHETERIZED   Final   Special Requests NONE   Final  Culture  Setup Time 06/08/2012 08:05   Final   Colony Count >=100,000 COLONIES/ML   Final   Culture ESCHERICHIA COLI   Final   Report Status 06/09/2012 FINAL   Final   Organism ID, Bacteria ESCHERICHIA COLI   Final  CULTURE, BLOOD (ROUTINE X 2)     Status: None   Collection Time    06/07/12  6:30 PM      Result Value Range Status   Specimen Description BLOOD LEFT ARM   Final   Special Requests BOTTLES DRAWN AEROBIC AND ANAEROBIC 5ML   Final   Culture  Setup Time 06/07/2012 23:27   Final   Culture     Final   Value:        BLOOD CULTURE RECEIVED NO GROWTH TO DATE CULTURE WILL BE HELD FOR 5 DAYS BEFORE ISSUING A FINAL NEGATIVE REPORT   Report Status PENDING   Incomplete  CULTURE, BLOOD (ROUTINE X 2)     Status: None   Collection Time    06/07/12  6:35 PM      Result Value Range Status   Specimen Description BLOOD LEFT ARM   Final   Special Requests BOTTLES DRAWN AEROBIC AND ANAEROBIC 5ML   Final   Culture   Setup Time 06/07/2012 23:27   Final   Culture     Final   Value:        BLOOD CULTURE RECEIVED NO GROWTH TO DATE CULTURE WILL BE HELD FOR 5 DAYS BEFORE ISSUING A FINAL NEGATIVE REPORT   Report Status PENDING   Incomplete  URINE CULTURE     Status: None   Collection Time    06/08/12  2:42 AM      Result Value Range Status   Specimen Description URINE, CLEAN CATCH   Final   Special Requests Normal   Final   Culture  Setup Time 06/08/2012 09:25   Final   Colony Count 40,000 COLONIES/ML   Final   Culture ENTEROCOCCUS SPECIES   Final   Report Status 06/11/2012 FINAL   Final   Organism ID, Bacteria ENTEROCOCCUS SPECIES   Final     Scheduled Meds: . aspirin  81 mg Oral QHS  . cefTRIAXone (ROCEPHIN)  IV  1 g Intravenous Q24H  . cilostazol  100 mg Oral BID  . clopidogrel  75 mg Oral q morning - 10a  . docusate sodium  100 mg Oral BID  . feeding supplement  237 mL Oral TID BM  . fenofibrate  54 mg Oral Daily  . ferrous sulfate  325 mg Oral QHS  . insulin aspart  0-15 Units Subcutaneous TID WC  . insulin detemir  8 Units Subcutaneous QHS  . levothyroxine  137 mcg Oral QAC breakfast  . metoprolol succinate  25 mg Oral q morning - 10a  . oxybutynin  5 mg Oral TID  . pantoprazole  40 mg Oral Daily  . vitamin B-12  1,000 mcg Oral q morning - 10a  . vitamin C  500 mg Oral q morning - 10a  . Vitamin D (Ergocalciferol)  50,000 Units Oral Q7 days   Continuous Infusions:    Camron Essman, DO  [facility] Pager [phone]  If 7PM-7AM, please contact night-coverage [email or web address] Password [email or web address] 06/11/2012, 5:49 PM   LOS: 4 days

## 2012-06-12 DIAGNOSIS — N390 Urinary tract infection, site not specified: Secondary | ICD-10-CM | POA: Diagnosis not present

## 2012-06-12 DIAGNOSIS — E871 Hypo-osmolality and hyponatremia: Secondary | ICD-10-CM | POA: Diagnosis not present

## 2012-06-12 DIAGNOSIS — G9340 Encephalopathy, unspecified: Secondary | ICD-10-CM | POA: Diagnosis not present

## 2012-06-12 DIAGNOSIS — N179 Acute kidney failure, unspecified: Secondary | ICD-10-CM | POA: Diagnosis not present

## 2012-06-12 LAB — GLUCOSE, CAPILLARY: Glucose-Capillary: 163 mg/dL — ABNORMAL HIGH (ref 70–99)

## 2012-06-12 MED ORDER — INSULIN DETEMIR 100 UNIT/ML ~~LOC~~ SOLN
12.0000 [IU] | Freq: Every day | SUBCUTANEOUS | Status: DC
  Filled 2012-06-12: qty 0.12

## 2012-06-12 MED ORDER — CEPHALEXIN 500 MG PO CAPS: 500.0000 mg | ORAL_CAPSULE | Freq: Three times a day (TID) | ORAL | Status: DC

## 2012-06-12 MED ORDER — CEPHALEXIN 500 MG PO CAPS
500.0000 mg | ORAL_CAPSULE | Freq: Three times a day (TID) | ORAL | Status: DC
  Filled 2012-06-12 (×3): qty 1

## 2012-06-12 MED ORDER — TRAMADOL HCL 50 MG PO TABS: 50.0000 mg | ORAL_TABLET | Freq: Four times a day (QID) | ORAL | Status: DC | PRN

## 2012-06-12 NOTE — Progress Notes (Signed)
Patient discharged to home with home care services as per case management.  Patient's daughter at bedside.  Reviewed discharge instructions and medications with patient.  No further questions at this this time.  IV in right wrist and right antecube removed.  Patient escorted to lobby via wheelchair.  Patient discharged.

## 2012-06-12 NOTE — Discharge Summary (Signed)
Physician Discharge Summary  [name] [name] [id number] DOB: 03/20/1940 DOA: 06/07/2012  PCP: [name], MD  Admit date: 06/07/2012 Discharge date: 06/12/2012  Recommendations for Outpatient Follow-up:  1. Pt will need to follow up with PCP in 2 weeks post discharge 2. Please obtain BMP to evaluate electrolytes and kidney function 3. Please also check CBC to evaluate Hg and Hct levels   Discharge Diagnoses:  Active Problems:   UTI (lower urinary tract infection)   CAD (coronary artery disease)   HTN (hypertension)   Acute encephalopathy   Acute on chronic renal failure   Chronic combined systolic and diastolic congestive heart failure Acute metabolic encephalopathy  -Likely due to UTI and dehydration  -Improving with antibiotics and IV fluids  -Appears to be back to baseline, but has episode of sundowning  -check B12--1601  -RBC folate pending  Dehydration/acute on chronic renal failure  -improved  -Baseline creatinine 1.0-1.3  -Judicious IV fluids-->saline lock today (06/09/12)  UTI/cystitis--E.coli  -Restart ceftriaxone 06/10/12 -home with cephalexin x 5 more days -Discontinue Cipro  -Enterococcus in urine is not clinically significant, would not treat with antibiotics  Diabetes mellitus type 2  -Patient normally takes Levemir 16 units at bedtime  -NovoLog sliding scale for now  -Discontinue metformin in light of patient's CKD  -continue Levemir 8 units q hs--> CBGs remained controlled  Hypertension  -Continue metoprolol succinate  -Discontinue losartan in the face of AKI  -BP controlled  -Plan to restart Glucerna at the time of discharge  Left-sided hydronephrosis  -Status post removal 06/05/2012, Dr. Janice Norrie  -06/08/2012 renal ultrasound shows fullness in the left renal pelvis without frank hydronephrosis  Chronic systolic and diastolic heart failure  -Compensated at this time  -Ejection fraction 15%  Coronary artery disease  -Continue Plavix and aspirin   Hyponatremia  -Likely volume depletion  -Hold furosemide  History of abdominal aortic aneurysm, peripheral vascular disease  -Continue Pletal   Antibiotics:  Ceftriaxone 06/07/2012>>> 06/08/2012  Ceftriaxone 06/10/12>>>  Ciprofloxacin 06/08/2012>>>06/10/12 HOSPITAL COURSE 72 year old female with a history of CAD status post CABG 7 years ago, chronic ischemic cardiomyopathy, combined grade 1 diastolic CHF and systolic CHF last echo in 2011 shows EF 15%, recent left-sided renal stent placement, AAA, PVD, hypertension, dyslipidemia, hypothyroidism who had her left ureteral stent removed by Dr. Janice Norrie on 06/06/2012. At home, the patient began experiencing flank pain. The patient took some hydrocodone and essentially went to sleep over the next 24 hours. Apparently, the patient became confused the night before admission tried to call her daughter at midnight. Because of the worsening confusion, the patient was brought to the emergency department. She was found to have pyuria as well as acute on chronic renal failure. The patient was initially started on ceftriaxone and intravenous fluids. Urinalysis showed pyuria with 21-50 WBCs, lactic acid 2.0, WBC 14.3. Blood cultures were obtained and are negative at the time of discharge. Serum B12 was checked which came back to be 1601. The patient's mentation improved significantly with antibiotics. Her ceftriaxone was initially changed to Cipro because a recent culture in January 2014 showed Citrobacter resistant to ceftriaxone. Final culture data from her urine revealed Escherichia coli sensitive to ceftriaxone. The patient was switched back to ceftriaxone. She will be discharged on cephalexin for 5 additional days. The patient's serum creatinine improved from 1.81 to 1.15. Renal ultrasound was obtained and showed fullness in the left renal pelvis without any frank hydronephrosis. This was the same side as her previous ureteral stent which has been removed. Serial  troponins were negative. EKG was reassuring. The patient also grew enterococcus in her urine which was not thought to be clinically significant. There were only 40,000 colonies. The patient was found to have a pre-albumin of 7.7. The patient was started on Glucerna supplementation. Her CBGs were controlled. The patient was started on only NovoLog sliding scale initially because her sugars were marginal. Subsequently, the patient was started on Levemir 8 units at bedtime. Her sugars remained controlled. Regarding her hypertension, metoprolol succinate was restarted. Her losartan was held because of her acute kidney injury. The losartan will be restarted at the time of discharge. Because of the patient's CKD, the patient's metformin will not be restarted. The patient was instructed to restart her Levemir 16 units at bedtime. In addition, the patient was instructed to not restart her NovoLog 8 units with lunch until she follows up with her primary care provider. The patient was instructed to keep a glycemic log and to take the log to her primary care provider for further adjustment of her glycemic control. As the patient tolerated her diet, her saline was saline locked. Although the patient has a history of chronic systolic and diastolic heart failure, the patient remained compensated throughout the hospitalization. There were no signs of fluid overload.  As the patient's mentation improved, physical therapy was ordered. The patient participated with physical therapy. The patient did well. The patient walked 150 feet with physical therapy with minimal assist. Home health was set up for the patient.  Discharge Condition: stable  Disposition:   Diet: carbohydrate modified Wt Readings from Last 3 Encounters:  06/10/12 78.881 kg (173 lb 14.4 oz)  05/08/12 75.8 kg (167 lb 1.7 oz)  03/09/12 79.017 kg (174 lb 3.2 oz)       Discharge Exam: Filed Vitals:   06/12/12 1309  BP: 152/60  Pulse: 67  Temp: 97.8  F (36.6 C)  Resp: 18   Filed Vitals:   06/11/12 1451 06/11/12 2057 06/12/12 0538 06/12/12 1309  BP: 155/76 137/62 148/81 152/60  Pulse: 69 66 81 67  Temp: 97.3 F (36.3 C) 98.5 F (36.9 C) 98.1 F (36.7 C) 97.8 F (36.6 C)  TempSrc: Oral Oral Oral Oral  Resp: 20 18 20 18   Height:      Weight:      SpO2: 99% 96% 98% 97%   General: A&O x 3, NAD, pleasant, cooperative Cardiovascular: RRR, no rub, no gallop, no S3 Respiratory: CTAB, no wheeze, no rhonchi Abdomen:soft, nontender, nondistended, positive bowel sounds Extremities: No edema, No lymphangitis, no petechiae  Discharge Instructions      Discharge Orders   Future Appointments Provider Department Dept Phone   06/20/2012 2:00 PM [facility] [address] [facility] [address] [phone]   Future Orders Complete By Expires     Diet - low sodium heart healthy  As directed     Discharge instructions  As directed     Comments:      Do Not take Novolog 8 units with lunch until you follow up with your primary care physician.  Check your sugars before each meal, keep a log, and take the log to your primary care physician. Cephalexin 500mg , three times a day x 5 days    Increase activity slowly  As directed         Medication List    STOP taking these medications       insulin aspart 100 UNIT/ML injection  Commonly known as:  novoLOG  metFORMIN 500 MG 24 hr tablet  Commonly known as:  GLUCOPHAGE-XR      TAKE these medications       aspirin 81 MG chewable tablet  Chew 81 mg by mouth at bedtime.     calcium-vitamin D 500-200 MG-UNIT per tablet  Commonly known as:  OSCAL WITH D  Take 1 tablet by mouth every morning.     cephALEXin 500 MG capsule  Commonly known as:  KEFLEX  Take 1 capsule (500 mg total) by mouth every 8 (eight) hours.     cilostazol 100 MG tablet  Commonly known as:  PLETAL  Take 100 mg by mouth 2 (two) times daily.     clopidogrel 75 MG tablet  Commonly known as:   PLAVIX  Take 75 mg by mouth every morning.     docusate sodium 100 MG capsule  Commonly known as:  COLACE  Take 100 mg by mouth 2 (two) times daily as needed for constipation.     ferrous sulfate 325 (65 FE) MG tablet  Take 325 mg by mouth at bedtime.     furosemide 20 MG tablet  Commonly known as:  LASIX  Take 1 tablet (20 mg total) by mouth daily.     HYDROcodone-acetaminophen 5-500 MG per tablet  Commonly known as:  VICODIN  Take 2 tablets by mouth at bedtime.     insulin detemir 100 UNIT/ML injection  Commonly known as:  LEVEMIR  Inject 16 Units into the skin at bedtime.     levothyroxine 137 MCG tablet  Commonly known as:  SYNTHROID, LEVOTHROID  Take 137 mcg by mouth daily before breakfast.     losartan 25 MG tablet  Commonly known as:  COZAAR  Take 25 mg by mouth daily.     magnesium oxide 400 MG tablet  Commonly known as:  MAG-OX  Take 800 mg by mouth at bedtime.     metoprolol succinate 25 MG 24 hr tablet  Commonly known as:  TOPROL-XL  Take 25 mg by mouth every morning.     nitrofurantoin (macrocrystal-monohydrate) 100 MG capsule  Commonly known as:  MACROBID  Take 100 mg by mouth 2 (two) times daily. 5 day course of therapy started 06/03/12.     omeprazole 20 MG capsule  Commonly known as:  PRILOSEC  Take 20 mg by mouth daily.     oxybutynin 5 MG tablet  Commonly known as:  DITROPAN  Take 5 mg by mouth 3 (three) times daily.     rosuvastatin 20 MG tablet  Commonly known as:  CRESTOR  Take 20 mg by mouth daily.     TRILIPIX 135 MG capsule  Generic drug:  Choline Fenofibrate  Take 135 mg by mouth at bedtime.     vitamin B-12 1000 MCG tablet  Commonly known as:  CYANOCOBALAMIN  Take 1,000 mcg by mouth every morning.     vitamin C 500 MG tablet  Commonly known as:  ASCORBIC ACID  Take 500 mg by mouth every morning.     Vitamin D (Ergocalciferol) 50000 UNITS Caps  Commonly known as:  DRISDOL  Take 50,000 Units by mouth every 7 (seven) days. On  thursdays         The results of significant diagnostics from this hospitalization (including imaging, microbiology, ancillary and laboratory) are listed below for reference.    Significant Diagnostic Studies: Dg Chest 2 View  06/07/2012  *RADIOLOGY REPORT*  Clinical Data: Altered mental status.  CHEST - 2 VIEW  Comparison: 05/07/2012  Findings: Stable surgical changes.  The heart is normal in size. The mediastinal and hilar contours are within normal limits and unchanged.  The lungs are clear except for minimal streaky left basilar atelectasis and a very small left pleural effusion.  No pulmonary edema.  The bony thorax is intact.  IMPRESSION: Small left effusion with minimal overlying atelectasis.   Original Report Authenticated By: Marijo Sanes, M.D.    Ct Head Wo Contrast  06/07/2012  *RADIOLOGY REPORT*  Clinical Data: Confusion and disorientation.  CT HEAD WITHOUT CONTRAST  Technique:  Contiguous axial images were obtained from the base of the skull through the vertex without contrast.  Comparison: 03/08/2012.  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  The ventricles are stable in size and in the midline without mass effect or shift.  Normal configuration.  No extra-axial fluid collections.  No CT findings for acute hemispheric infarction and/or intracranial hemorrhage.  No mass lesions.  The brainstem and cerebellum are grossly normal and stable.  Dense vascular calcifications are noted.  The bony structures are intact.  The paranasal sinuses and mastoid air cells are clear.  IMPRESSION:  1.  Stable advanced periventricular white matter disease. 2.  No acute intracranial findings or mass lesion.   Original Report Authenticated By: Marijo Sanes, M.D.    US Renal  06/08/2012  *RADIOLOGY REPORT*  Clinical Data: Left hydronephrosis, ureteral stent removed  RENAL/URINARY TRACT ULTRASOUND COMPLETE  Comparison:  Renal ultrasound  dated 06/06/2011  Findings:  Right Kidney:  Measures 10.4 cm.  Cortical thinning. 10 x 9 x 9 mm probable upper pole cyst, although not simple by ultrasound.  No hydronephrosis.  Left Kidney:  Measures 11.1 cm.  Cortical thinning.  11 x 10 x 9 mm probable upper pole cyst, although not simple by ultrasound. Fullness of the renal pelvis without frank hydronephrosis.  Bladder:  Within normal limits.  Bilateral bladder jets are visualized.  IMPRESSION: Fullness of the left renal pelvis without frank hydronephrosis.  10 mm probable right upper pole cyst and 11 mm probable left upper pole cyst, neither of which are simple by ultrasound.   Original Report Authenticated By: Julian Hy, M.D.      Microbiology: Recent Results (from the past 240 hour(s))  URINE CULTURE     Status: None   Collection Time    06/07/12  4:56 PM      Result Value Range Status   Specimen Description URINE, CATHETERIZED   Final   Special Requests NONE   Final   Culture  Setup Time 06/08/2012 08:05   Final   Colony Count >=100,000 COLONIES/ML   Final   Culture ESCHERICHIA COLI   Final   Report Status 06/09/2012 FINAL   Final   Organism ID, Bacteria ESCHERICHIA COLI   Final  CULTURE, BLOOD (ROUTINE X 2)     Status: None   Collection Time    06/07/12  6:30 PM      Result Value Range Status   Specimen Description BLOOD LEFT ARM   Final   Special Requests BOTTLES DRAWN AEROBIC AND ANAEROBIC 5ML   Final   Culture  Setup Time 06/07/2012 23:27   Final   Culture     Final   Value:        BLOOD CULTURE RECEIVED NO GROWTH TO DATE CULTURE WILL BE HELD FOR 5 DAYS BEFORE ISSUING A FINAL NEGATIVE REPORT   Report Status PENDING  Incomplete  CULTURE, BLOOD (ROUTINE X 2)     Status: None   Collection Time    06/07/12  6:35 PM      Result Value Range Status   Specimen Description BLOOD LEFT ARM   Final   Special Requests BOTTLES DRAWN AEROBIC AND ANAEROBIC 5ML   Final   Culture  Setup Time 06/07/2012 23:27   Final   Culture      Final   Value:        BLOOD CULTURE RECEIVED NO GROWTH TO DATE CULTURE WILL BE HELD FOR 5 DAYS BEFORE ISSUING A FINAL NEGATIVE REPORT   Report Status PENDING   Incomplete  URINE CULTURE     Status: None   Collection Time    06/08/12  2:42 AM      Result Value Range Status   Specimen Description URINE, CLEAN CATCH   Final   Special Requests Normal   Final   Culture  Setup Time 06/08/2012 09:25   Final   Colony Count 40,000 COLONIES/ML   Final   Culture ENTEROCOCCUS SPECIES   Final   Report Status 06/11/2012 FINAL   Final   Organism ID, Bacteria ENTEROCOCCUS SPECIES   Final     Labs: Basic Metabolic Panel:  Recent Labs Lab 06/07/12 1642 06/08/12 0453 06/09/12 0506 06/10/12 0521 06/11/12 0518  NA 129* 131* 133* 137 134*  K 5.3* 3.6 3.2* 3.9 4.0  CL 92* 96 99 105 105  CO2 26 23 25 22 22   GLUCOSE 193* 241* 175* 127* 170*  BUN 47* 43* 30* 22 17  CREATININE 1.81* 1.63* 1.43* 1.22* 1.15*  CALCIUM 9.1 8.6 7.9* 8.4 8.9  MG  --   --   --  1.7  --    Liver Function Tests: No results found for this basename: AST, ALT, ALKPHOS, BILITOT, PROT, ALBUMIN,  in the last 168 hours No results found for this basename: LIPASE, AMYLASE,  in the last 168 hours No results found for this basename: AMMONIA,  in the last 168 hours CBC:  Recent Labs Lab 06/07/12 1642 06/09/12 0506  WBC 14.3* 8.1  NEUTROABS 12.0*  --   HGB 11.8* 10.3*  HCT 35.4* 30.2*  MCV 92.2 91.8  PLT 225 170   Cardiac Enzymes:  Recent Labs Lab 06/07/12 1642  TROPONINI <0.30   BNP: No components found with this basename: POCBNP,  CBG:  Recent Labs Lab 06/11/12 1114 06/11/12 1701 06/11/12 2215 06/12/12 0727 06/12/12 1142  GLUCAP 133* 158* 166* 163* 159*    Time coordinating discharge:  Greater than 30 minutes  Signed:  Saleema Weppler, DO [facility] Pager: [phone] 06/12/2012, 2:50 PM

## 2012-06-12 NOTE — Progress Notes (Signed)
HHPT ordered, pt chose [facility] to provide the services. Referral made.

## 2012-06-12 NOTE — Progress Notes (Signed)
CSW discussed bed offers with patient's daughter. CSW explained that several people have said no because patient does not appear to have a skillable need as patient is walking independently. Patients daughter spoke with patient and they are now agreeable to home with home health.  Melissa Tomaselli C. [place] MSW, [place]

## 2012-06-13 LAB — CULTURE, BLOOD (ROUTINE X 2): Culture: NO GROWTH

## 2012-06-16 DIAGNOSIS — D649 Anemia, unspecified: Secondary | ICD-10-CM | POA: Diagnosis not present

## 2012-06-16 DIAGNOSIS — E785 Hyperlipidemia, unspecified: Secondary | ICD-10-CM | POA: Diagnosis not present

## 2012-06-16 DIAGNOSIS — N390 Urinary tract infection, site not specified: Secondary | ICD-10-CM | POA: Diagnosis not present

## 2012-06-16 DIAGNOSIS — I10 Essential (primary) hypertension: Secondary | ICD-10-CM | POA: Diagnosis not present

## 2012-06-16 DIAGNOSIS — E119 Type 2 diabetes mellitus without complications: Secondary | ICD-10-CM | POA: Diagnosis not present

## 2012-06-16 DIAGNOSIS — E039 Hypothyroidism, unspecified: Secondary | ICD-10-CM | POA: Diagnosis not present

## 2012-06-16 DIAGNOSIS — R945 Abnormal results of liver function studies: Secondary | ICD-10-CM | POA: Diagnosis not present

## 2012-06-20 ENCOUNTER — Encounter (HOSPITAL_COMMUNITY): Payer: Medicare Other

## 2012-06-20 DIAGNOSIS — N390 Urinary tract infection, site not specified: Secondary | ICD-10-CM | POA: Diagnosis not present

## 2012-06-21 DIAGNOSIS — E1165 Type 2 diabetes mellitus with hyperglycemia: Secondary | ICD-10-CM | POA: Diagnosis not present

## 2012-06-21 DIAGNOSIS — E1129 Type 2 diabetes mellitus with other diabetic kidney complication: Secondary | ICD-10-CM | POA: Diagnosis not present

## 2012-06-21 DIAGNOSIS — I2510 Atherosclerotic heart disease of native coronary artery without angina pectoris: Secondary | ICD-10-CM | POA: Diagnosis not present

## 2012-06-21 DIAGNOSIS — I509 Heart failure, unspecified: Secondary | ICD-10-CM | POA: Diagnosis not present

## 2012-06-21 DIAGNOSIS — N189 Chronic kidney disease, unspecified: Secondary | ICD-10-CM | POA: Diagnosis not present

## 2012-06-21 DIAGNOSIS — IMO0001 Reserved for inherently not codable concepts without codable children: Secondary | ICD-10-CM | POA: Diagnosis not present

## 2012-06-21 DIAGNOSIS — I5042 Chronic combined systolic (congestive) and diastolic (congestive) heart failure: Secondary | ICD-10-CM | POA: Diagnosis not present

## 2012-06-22 ENCOUNTER — Ambulatory Visit (HOSPITAL_COMMUNITY)
Admission: RE | Admit: 2012-06-22 | Discharge: 2012-06-22 | Disposition: A | Discharge status: Home / Self Care | Payer: Medicare Other | Source: Ambulatory Visit | Attending: Cardiovascular Disease | Admitting: Cardiovascular Disease

## 2012-06-22 DIAGNOSIS — I739 Peripheral vascular disease, unspecified: Secondary | ICD-10-CM | POA: Diagnosis not present

## 2012-06-22 DIAGNOSIS — I6529 Occlusion and stenosis of unspecified carotid artery: Secondary | ICD-10-CM | POA: Diagnosis not present

## 2012-07-03 DIAGNOSIS — N390 Urinary tract infection, site not specified: Secondary | ICD-10-CM | POA: Diagnosis not present

## 2012-07-14 DIAGNOSIS — D649 Anemia, unspecified: Secondary | ICD-10-CM | POA: Diagnosis not present

## 2012-07-14 DIAGNOSIS — N390 Urinary tract infection, site not specified: Secondary | ICD-10-CM | POA: Diagnosis not present

## 2012-07-14 DIAGNOSIS — I509 Heart failure, unspecified: Secondary | ICD-10-CM | POA: Diagnosis not present

## 2012-07-17 DIAGNOSIS — N390 Urinary tract infection, site not specified: Secondary | ICD-10-CM | POA: Diagnosis not present

## 2012-07-17 DIAGNOSIS — N1330 Unspecified hydronephrosis: Secondary | ICD-10-CM | POA: Diagnosis not present

## 2012-07-20 DIAGNOSIS — E875 Hyperkalemia: Secondary | ICD-10-CM | POA: Diagnosis not present

## 2012-07-28 DIAGNOSIS — E785 Hyperlipidemia, unspecified: Secondary | ICD-10-CM | POA: Diagnosis not present

## 2012-07-28 DIAGNOSIS — I10 Essential (primary) hypertension: Secondary | ICD-10-CM | POA: Diagnosis not present

## 2012-08-01 DIAGNOSIS — M79609 Pain in unspecified limb: Secondary | ICD-10-CM | POA: Diagnosis not present

## 2012-08-01 DIAGNOSIS — E785 Hyperlipidemia, unspecified: Secondary | ICD-10-CM | POA: Diagnosis not present

## 2012-08-01 DIAGNOSIS — D649 Anemia, unspecified: Secondary | ICD-10-CM | POA: Diagnosis not present

## 2012-08-01 DIAGNOSIS — N390 Urinary tract infection, site not specified: Secondary | ICD-10-CM | POA: Diagnosis not present

## 2012-08-01 DIAGNOSIS — N189 Chronic kidney disease, unspecified: Secondary | ICD-10-CM | POA: Diagnosis not present

## 2012-08-04 DIAGNOSIS — N390 Urinary tract infection, site not specified: Secondary | ICD-10-CM | POA: Diagnosis not present

## 2012-08-15 ENCOUNTER — Encounter: Payer: Self-pay | Admitting: Cardiovascular Disease

## 2012-08-22 DIAGNOSIS — N1330 Unspecified hydronephrosis: Secondary | ICD-10-CM | POA: Diagnosis not present

## 2012-08-22 DIAGNOSIS — N281 Cyst of kidney, acquired: Secondary | ICD-10-CM | POA: Diagnosis not present

## 2012-08-22 DIAGNOSIS — N390 Urinary tract infection, site not specified: Secondary | ICD-10-CM | POA: Diagnosis not present

## 2012-09-01 DIAGNOSIS — H409 Unspecified glaucoma: Secondary | ICD-10-CM | POA: Diagnosis not present

## 2012-09-01 DIAGNOSIS — H4011X0 Primary open-angle glaucoma, stage unspecified: Secondary | ICD-10-CM | POA: Diagnosis not present

## 2012-09-01 DIAGNOSIS — N1330 Unspecified hydronephrosis: Secondary | ICD-10-CM | POA: Diagnosis not present

## 2012-09-01 DIAGNOSIS — N390 Urinary tract infection, site not specified: Secondary | ICD-10-CM | POA: Diagnosis not present

## 2012-09-04 ENCOUNTER — Other Ambulatory Visit: Payer: Self-pay

## 2012-09-04 MED ORDER — INSULIN DETEMIR 100 UNIT/ML ~~LOC~~ SOLN: 10.0000 [IU] | Freq: Every day | SUBCUTANEOUS | Status: DC

## 2012-09-18 ENCOUNTER — Other Ambulatory Visit: Payer: Self-pay | Admitting: *Deleted

## 2012-09-18 MED ORDER — VITAMIN D (ERGOCALCIFEROL) 1.25 MG (50000 UNIT) PO CAPS: 50000.0000 [IU] | ORAL_CAPSULE | ORAL | Status: DC

## 2012-09-29 DIAGNOSIS — N390 Urinary tract infection, site not specified: Secondary | ICD-10-CM | POA: Diagnosis not present

## 2012-10-01 ENCOUNTER — Other Ambulatory Visit: Payer: Self-pay | Admitting: Endocrinology

## 2012-10-01 DIAGNOSIS — E1169 Type 2 diabetes mellitus with other specified complication: Secondary | ICD-10-CM | POA: Insufficient documentation

## 2012-10-01 DIAGNOSIS — E039 Hypothyroidism, unspecified: Secondary | ICD-10-CM | POA: Insufficient documentation

## 2012-10-01 DIAGNOSIS — E785 Hyperlipidemia, unspecified: Secondary | ICD-10-CM

## 2012-10-01 DIAGNOSIS — E119 Type 2 diabetes mellitus without complications: Secondary | ICD-10-CM

## 2012-10-02 ENCOUNTER — Other Ambulatory Visit (INDEPENDENT_AMBULATORY_CARE_PROVIDER_SITE_OTHER): Payer: Medicare Other

## 2012-10-02 DIAGNOSIS — E119 Type 2 diabetes mellitus without complications: Secondary | ICD-10-CM

## 2012-10-02 DIAGNOSIS — E785 Hyperlipidemia, unspecified: Secondary | ICD-10-CM

## 2012-10-02 DIAGNOSIS — E039 Hypothyroidism, unspecified: Secondary | ICD-10-CM

## 2012-10-02 LAB — COMPREHENSIVE METABOLIC PANEL
ALT: 14 U/L (ref 0–35)
AST: 20 U/L (ref 0–37)
Albumin: 3.5 g/dL (ref 3.5–5.2)
Alkaline Phosphatase: 44 U/L (ref 39–117)
Potassium: 5.7 mEq/L — ABNORMAL HIGH (ref 3.5–5.1)
Sodium: 140 mEq/L (ref 135–145)
Total Protein: 6.9 g/dL (ref 6.0–8.3)

## 2012-10-02 LAB — TSH: TSH: 0.39 u[IU]/mL (ref 0.35–5.50)

## 2012-10-02 LAB — MICROALBUMIN / CREATININE URINE RATIO
Creatinine,U: 55.5 mg/dL
Microalb Creat Ratio: 13.9 mg/g (ref 0.0–30.0)
Microalb, Ur: 7.7 mg/dL — ABNORMAL HIGH (ref 0.0–1.9)

## 2012-10-02 LAB — LDL CHOLESTEROL, DIRECT: Direct LDL: 110.4 mg/dL

## 2012-10-02 LAB — LIPID PANEL: Total CHOL/HDL Ratio: 5

## 2012-10-04 ENCOUNTER — Ambulatory Visit (INDEPENDENT_AMBULATORY_CARE_PROVIDER_SITE_OTHER): Payer: Medicare Other | Admitting: Endocrinology

## 2012-10-04 ENCOUNTER — Encounter: Payer: Self-pay | Admitting: Endocrinology

## 2012-10-04 VITALS — BP 130/70 | HR 62 | Temp 97.6°F | Resp 12 | Ht 66.0 in | Wt 179.1 lb

## 2012-10-04 DIAGNOSIS — R0609 Other forms of dyspnea: Secondary | ICD-10-CM

## 2012-10-04 DIAGNOSIS — E875 Hyperkalemia: Secondary | ICD-10-CM

## 2012-10-04 DIAGNOSIS — D649 Anemia, unspecified: Secondary | ICD-10-CM | POA: Diagnosis not present

## 2012-10-04 DIAGNOSIS — R0989 Other specified symptoms and signs involving the circulatory and respiratory systems: Secondary | ICD-10-CM | POA: Diagnosis not present

## 2012-10-04 DIAGNOSIS — R0600 Dyspnea, unspecified: Secondary | ICD-10-CM

## 2012-10-04 MED ORDER — SODIUM POLYSTYRENE SULFONATE 15 GM/60ML PO SUSP: 15.0000 g | Freq: Once | ORAL | Status: DC

## 2012-10-04 MED ORDER — ALBUTEROL SULFATE HFA 108 (90 BASE) MCG/ACT IN AERS: 2.0000 | INHALATION_SPRAY | Freq: Four times a day (QID) | RESPIRATORY_TRACT | Status: DC | PRN

## 2012-10-04 NOTE — Patient Instructions (Addendum)
Low potassium diet: Foods high in potassium include bananas, oranges, cantaloupes,  tomatoes, spinach and other green leafy vegetables, melons, peas and beans, and potatoes.  Take Trilipix every 2 days  2 weeks lab only to recheck potassium  Albuterol inhaler when out of breath at night, call if bleeding not better, may use OTC cough medication  Kayexalate 1/2 oz today and on Thursday  Exercise daily

## 2012-10-04 NOTE — Progress Notes (Signed)
Patient ID: [name] [name], female   DOB: 01-09-1941, 72 y.o.   MRN: [id number]  Reason for Appointment: Diabetes follow-up   History of Present Illness   Diagnosis: Type 2 diabetes mellitus, date of diagnosis: 1986.  Complications: none.  Monitors blood glucose: Twice a day.  Glucometer: One Touch.  Blood Glucose readings: Fasting averaging 120 to, range 104-139 nonfasting 120-167 with some readings in the afternoons and evenings  Hypoglycemia frequency: None recently.  Meals: she is trying to consume low fat meals ; meals 8 AM, relatively large lunch at midday and Light supper at 6 pm.  Calorie intake: small supper, balanced lunch which is her main meal.  Physical activity: exercise: a little on exercise bike.  Dietician visit: Most recent:, 5/13.   The patient is seen today in follow up of Type 2 diabetes which has been treated with low dose basal bolus insulin regimen.Marland Kitchen  Her blood sugars are relatively better than the last time with increasing her Levemir  The last HbgA1c was 6.3, on , 07/28/12.  The insulin regimen is described as Levemir 16 units hs, Novolog 5 ac and she is compliant with her regimen.   Iron Deficiency:  Has had recurrent anemia with hemoglobin 10.9.on the last visit despite using 65 mg iron  In 8/13 she had low iron saturation and was 19 in 2/14. Also taking B12. With iron infusion her hemoglobin was back up to 11.3.  Hemoccult was negative in 9/13.  Hyperlipidemia:  Patient here for followup fasting lipids Show inadequate control, triglycerides 247 and LDL 110. She is compliant with her current doses of Crestor and Fenofibrate    CKD: Her creatinine is 1.6 and this was better. Also potassium is high without any dietary exercises reviewed today and no history of use of nonsteroidal drugs are large doses of beta blockers Has not taken losartan and this was confirmed  Dyspnea:  Dyspnea at bedtime, may be associated with a little wheezing and she thinks this is  from her having a cold; some cough now without expectoration  HYPOTHYROIDISM: She has had long-standing primary hypothyroidism and this is adequately replaced   Appointment on 10/02/2012  Component Date Value Range Status  . Microalb, Ur 10/02/2012 7.7* 0.0 - 1.9 mg/dL Final  . Creatinine,U 10/02/2012 55.5   Final  . Microalb Creat Ratio 10/02/2012 13.9  0.0 - 30.0 mg/g Final  . Sodium 10/02/2012 140  135 - 145 mEq/L Final  . Potassium 10/02/2012 5.7* 3.5 - 5.1 mEq/L Final  . Chloride 10/02/2012 105  96 - 112 mEq/L Final  . CO2 10/02/2012 28  19 - 32 mEq/L Final  . Glucose, Bld 10/02/2012 130* 70 - 99 mg/dL Final  . BUN 10/02/2012 28* 6 - 23 mg/dL Final  . Creatinine, Ser 10/02/2012 1.6* 0.4 - 1.2 mg/dL Final  . Total Bilirubin 10/02/2012 0.4  0.3 - 1.2 mg/dL Final  . Alkaline Phosphatase 10/02/2012 44  39 - 117 U/L Final  . AST 10/02/2012 20  0 - 37 U/L Final  . ALT 10/02/2012 14  0 - 35 U/L Final  . Total Protein 10/02/2012 6.9  6.0 - 8.3 g/dL Final  . Albumin 10/02/2012 3.5  3.5 - 5.2 g/dL Final  . Calcium 10/02/2012 9.8  8.4 - 10.5 mg/dL Final  . GFR 10/02/2012 33.01* >60.00 mL/min Final  . Cholesterol 10/02/2012 180  0 - 200 mg/dL Final   ATP III Classification       Desirable:  < 200 mg/dL  Borderline High:  200 - 239 mg/dL          High:  > = 240 mg/dL  . Triglycerides 10/02/2012 247.0* 0.0 - 149.0 mg/dL Final   Normal:  <150 mg/dLBorderline High:  150 - 199 mg/dL  . HDL 10/02/2012 38.90* >39.00 mg/dL Final  . VLDL 10/02/2012 49.4* 0.0 - 40.0 mg/dL Final  . Total CHOL/HDL Ratio 10/02/2012 5   Final                  Men          Women1/2 Average Risk     3.4          3.3Average Risk          5.0          4.42X Average Risk          9.6          7.13X Average Risk          15.0          11.0                      . TSH 10/02/2012 0.39  0.35 - 5.50 uIU/mL Final  . Free T4 10/02/2012 1.47  0.60 - 1.60 ng/dL Final  . Direct LDL 10/02/2012 110.4   Final   Optimal:   <100 mg/dLNear or Above Optimal:  100-129 mg/dLBorderline High:  130-159 mg/dLHigh:  160-189 mg/dLVery High:  >190 mg/dL      Medication List       This list is accurate as of: 10/04/12  2:39 PM.  Always use your most recent med list.               aspirin 81 MG chewable tablet  Chew 81 mg by mouth at bedtime.     calcium-vitamin D 500-200 MG-UNIT per tablet  Commonly known as:  OSCAL WITH D  Take 1 tablet by mouth every morning.     cephALEXin 500 MG capsule  Commonly known as:  KEFLEX  Take 1 capsule (500 mg total) by mouth every 8 (eight) hours.     cilostazol 100 MG tablet  Commonly known as:  PLETAL  Take 100 mg by mouth 2 (two) times daily.     clopidogrel 75 MG tablet  Commonly known as:  PLAVIX  Take 75 mg by mouth every morning.     docusate sodium 100 MG capsule  Commonly known as:  COLACE  Take 100 mg by mouth 2 (two) times daily as needed for constipation.     ferrous sulfate 325 (65 FE) MG tablet  Take 325 mg by mouth at bedtime.     furosemide 20 MG tablet  Commonly known as:  LASIX  Take 1 tablet (20 mg total) by mouth daily.     HYDROcodone-acetaminophen 5-500 MG per tablet  Commonly known as:  VICODIN  Take 2 tablets by mouth at bedtime.     insulin detemir 100 UNIT/ML injection  Commonly known as:  LEVEMIR  Inject 18 Units into the skin at bedtime. Uses Levemir Flexpen     levothyroxine 137 MCG tablet  Commonly known as:  SYNTHROID, LEVOTHROID  Take 137 mcg by mouth daily before breakfast.     magnesium oxide 400 MG tablet  Commonly known as:  MAG-OX  Take 800 mg by mouth at bedtime.     metoprolol succinate 25 MG 24 hr tablet  Commonly known as:  TOPROL-XL  Take 25 mg by mouth every morning.     nitrofurantoin 50 MG capsule  Commonly known as:  MACRODANTIN  Take 50 mg by mouth at bedtime.     NOVOLOG FLEXPEN   Inject 5 Units into the skin. Take 5 units before every meal     omeprazole 20 MG capsule  Commonly known as:   PRILOSEC  Take 20 mg by mouth daily.     oxybutynin 5 MG tablet  Commonly known as:  DITROPAN  Take 5 mg by mouth 3 (three) times daily.     rosuvastatin 20 MG tablet  Commonly known as:  CRESTOR  Take 20 mg by mouth daily.     TRILIPIX 135 MG capsule  Generic drug:  Choline Fenofibrate  Take 135 mg by mouth at bedtime.     vitamin B-12 1000 MCG tablet  Commonly known as:  CYANOCOBALAMIN  Take 1,000 mcg by mouth every morning.     vitamin C 500 MG tablet  Commonly known as:  ASCORBIC ACID  Take 500 mg by mouth every morning.     Vitamin D (Ergocalciferol) 50000 UNITS Caps  Commonly known as:  DRISDOL  Take 1 capsule (50,000 Units total) by mouth every 7 (seven) days. On thursdays        Allergies: No Known Allergies  Past Medical History  Diagnosis Date  . Diabetes mellitus without complication   . CHF (congestive heart failure)   . Coronary artery disease   . AAA (abdominal aortic aneurysm) 03/09/2012  . PVD (peripheral vascular disease)   . Carotid artery stenosis   . Hypertension   . Hyperlipidemia   . Hypothyroidism     Past Surgical History  Procedure Laterality Date  . Cardiac surgery    . Abdominal hysterectomy    . Tumor removal    . Coronary stent placement    . Femoral artery repair    . Cystoscopy w/ ureteral stent placement  03/10/2012    Procedure: CYSTOSCOPY WITH RETROGRADE PYELOGRAM/URETERAL STENT PLACEMENT;  Surgeon: Hanley Ben, MD;  Location: [facility];  Service: Urology;  Laterality: Left;    Family History  Problem Relation Age of Onset  . Congestive Heart Failure Mother   . Diabetes Mother   . Stroke Father     Social History:  reports that she quit smoking about 3 years ago. She has never used smokeless tobacco. She reports that she does not drink alcohol or use illicit drugs.  Review of Systems - Cardiovascular ROS: positive for - CAD   Examination:   BP 124/70  Pulse 62  Temp(Src) 97.6 F (36.4 C)  Resp 12  Ht 5\' 6"  (1.676  m)  Wt 179 lb 1.6 oz (81.239 kg)  BMI 28.92 kg/m2  SpO2 94%  Body mass index is 28.92 kg/(m^2).   Assesment/PLAN:   . Diabetes mellitus:  Her blood sugars are overall better especially in the morning even though they were high on the last time with 18 units of Levemir  She is also taking NovoLog consistently for her mealtime coverage. She will continue the same insulin regimen   Also she can continue increasing exercise on exercise bike Currently able to maintain her weight    HYPOTHYROIDISM: Her TSH is normal with the current regimen and she is compliant with the medication   3. Hyperlipidemia - 272.4,  Still inadequately controlled with  her medications and cannot increase her Crestor because of renal dysfunction. LDL should be ideally below 70  Also has high triglycerides   4. CKD (chronic kidney disease) -  her creatinine is relatively higher and not clear why. She is not on losartan and does not have any recent problems with UTI. Does not think that she would have nephrotoxicity from any of her current medications. 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 Her blood pressure is not low today   5. Anemia - 285.9: Hemoglobin was need to be followed up, has anemia of chronic disease and iron deficiency     HYPERKALEMIA: This is also unexplained and may be related to renal dysfunction.. She does not have excessive potassium intake in her diet but given her information on low potassium diet. Will give her a single dose of Kayexalate and repeat her potassium   Thanh Pomerleau 10/04/2012, 2:39 PM

## 2012-10-18 ENCOUNTER — Other Ambulatory Visit (INDEPENDENT_AMBULATORY_CARE_PROVIDER_SITE_OTHER): Payer: Medicare Other

## 2012-10-18 ENCOUNTER — Other Ambulatory Visit: Payer: Self-pay | Admitting: *Deleted

## 2012-10-18 DIAGNOSIS — E875 Hyperkalemia: Secondary | ICD-10-CM | POA: Diagnosis not present

## 2012-10-18 DIAGNOSIS — D649 Anemia, unspecified: Secondary | ICD-10-CM

## 2012-10-18 LAB — CBC WITH DIFFERENTIAL/PLATELET
Basophils Relative: 0.5 % (ref 0.0–3.0)
Eosinophils Relative: 3.1 % (ref 0.0–5.0)
Monocytes Relative: 8.4 % (ref 3.0–12.0)
Neutrophils Relative %: 72.8 % (ref 43.0–77.0)
Platelets: 216 10*3/uL (ref 150.0–400.0)
RBC: 4 Mil/uL (ref 3.87–5.11)
WBC: 7 10*3/uL (ref 4.5–10.5)

## 2012-10-18 LAB — BASIC METABOLIC PANEL
Calcium: 9 mg/dL (ref 8.4–10.5)
Creatinine, Ser: 1.5 mg/dL — ABNORMAL HIGH (ref 0.4–1.2)
GFR: 37.78 mL/min — ABNORMAL LOW (ref >60.00)
Sodium: 140 mEq/L (ref 135–145)

## 2012-10-18 MED ORDER — GLUCOSE BLOOD VI STRP: ORAL_STRIP | Status: DC

## 2012-10-19 ENCOUNTER — Telehealth: Payer: Self-pay | Admitting: *Deleted

## 2012-10-19 NOTE — Progress Notes (Signed)
Quick Note:  Please let patient know that the lab results are better. She can stop potassium restrictions and may have bananas, citrus fruits etc. ______

## 2012-10-19 NOTE — Telephone Encounter (Signed)
Left message on patient's voicemail.

## 2012-10-19 NOTE — Telephone Encounter (Signed)
Message copied by Roxanna Mew on Thu Oct 19, 2012  1:43 PM ------      Message from: [name]      Created: Thu Oct 19, 2012 12:43 PM       Please let patient know that the lab results are better. She can stop potassium restrictions and may have bananas, citrus fruits etc. ------

## 2012-11-08 ENCOUNTER — Encounter: Payer: Self-pay | Admitting: Endocrinology

## 2012-11-08 ENCOUNTER — Ambulatory Visit (INDEPENDENT_AMBULATORY_CARE_PROVIDER_SITE_OTHER): Payer: Medicare Other | Admitting: Endocrinology

## 2012-11-08 ENCOUNTER — Other Ambulatory Visit: Payer: Self-pay | Admitting: *Deleted

## 2012-11-08 VITALS — BP 122/62 | HR 53 | Temp 98.6°F | Resp 14 | Ht 66.0 in | Wt 180.9 lb

## 2012-11-08 DIAGNOSIS — E875 Hyperkalemia: Secondary | ICD-10-CM

## 2012-11-08 DIAGNOSIS — N183 Chronic kidney disease, stage 3 unspecified: Secondary | ICD-10-CM | POA: Insufficient documentation

## 2012-11-08 DIAGNOSIS — E785 Hyperlipidemia, unspecified: Secondary | ICD-10-CM

## 2012-11-08 DIAGNOSIS — E119 Type 2 diabetes mellitus without complications: Secondary | ICD-10-CM

## 2012-11-08 DIAGNOSIS — E039 Hypothyroidism, unspecified: Secondary | ICD-10-CM

## 2012-11-08 LAB — COMPREHENSIVE METABOLIC PANEL
ALT: 12 U/L (ref 0–35)
Albumin: 3.5 g/dL (ref 3.5–5.2)
CO2: 26 mEq/L (ref 19–32)
Calcium: 9.2 mg/dL (ref 8.4–10.5)
Chloride: 105 mEq/L (ref 96–112)
GFR: 41.02 mL/min — ABNORMAL LOW (ref >60.00)
Glucose, Bld: 131 mg/dL — ABNORMAL HIGH (ref 70–99)
Potassium: 5.9 mEq/L — ABNORMAL HIGH (ref 3.5–5.1)
Sodium: 136 mEq/L (ref 135–145)
Total Protein: 6.6 g/dL (ref 6.0–8.3)

## 2012-11-08 LAB — HEMOGLOBIN A1C: Hgb A1c MFr Bld: 6.4 % (ref 4.6–6.5)

## 2012-11-08 MED ORDER — HYDROCODONE-ACETAMINOPHEN 5-500 MG PO TABS: 2.0000 | ORAL_TABLET | Freq: Every day | ORAL | Status: DC

## 2012-11-08 NOTE — Progress Notes (Signed)
Patient ID: [name] [name], female   DOB: 02/02/1941, 72 y.o.   MRN: [id number]  Reason for Appointment: Diabetes follow-up   History of Present Illness   The patient is seen today in follow up of Type 2 diabetes which has been treated with low dose basal bolus insulin regimen.Marland Kitchen  Her blood sugars are  looking fairly good with only mild increase in glucose However has not been checking her blood sugars much  The last HbgA1c was 6.3, on , 07/28/12.  The insulin regimen is described as Levemir 18 units hs, Novolog 5 ac and she is compliant with her regimen.   Diagnosis: Type 2 diabetes mellitus, date of diagnosis: 1986.  Complications: none.  Side effects from medications: Diarrhea from metformin and nausea and vomiting from GLP-1 drugs  Monitors blood glucose: Less than once a day.  Glucometer: One Touch.  Blood Glucose readings: Fasting 105-141, afternoon and evening 120-159 with overall only 9 readings and median 125  Hypoglycemia frequency: None recently.  Meals: she is trying to consume low fat meals ; meals 11.30 AM,and Light supper at 5 pm.  Calorie intake: small supper, balanced lunch which is her main meal.  Physical activity: exercise: a little on exercise bike.  Dietician visit: Most recent:, 5/13.   Wt Readings from Last 3 Encounters:  11/08/12 180 lb 14.4 oz (82.056 kg)  10/04/12 179 lb 1.6 oz (81.239 kg)  06/10/12 173 lb 14.4 oz (78.881 kg)    Lab Results  Component Value Date   HGBA1C 6.0* 05/07/2012    Iron Deficiency:  Has had recurrent anemia with hemoglobin  Previous history: In 8/13 she had low iron saturation and was 19 in 2/14. Also taking B12. With iron infusion her hemoglobin was back up to 11.3.  Hemoccult was negative in 9/13. Most recent hemoglobin was back to normal at 12.1  CKD: Her last creatinine was 1.5, not clear why it was high and associated with hyperkalemia which was treated with Kayexalate. Followup potassium was 3.5  HYPOTHYROIDISM: She has  had long-standing primary hypothyroidism and this is adequately replaced with last TSH normal     Medication List       This list is accurate as of: 11/08/12  3:14 PM.  Always use your most recent med list.               albuterol 108 (90 BASE) MCG/ACT inhaler  Commonly known as:  PROVENTIL HFA;VENTOLIN HFA  Inhale 2 puffs into the lungs every 6 (six) hours as needed for wheezing.     aspirin 81 MG chewable tablet  Chew 81 mg by mouth at bedtime.     BD PEN NEEDLE NANO U/F 32G X 4 MM Misc  Generic drug:  Insulin Pen Needle     calcium-vitamin D 500-200 MG-UNIT per tablet  Commonly known as:  OSCAL WITH D  Take 1 tablet by mouth every morning.     cephALEXin 500 MG capsule  Commonly known as:  KEFLEX  Take 1 capsule (500 mg total) by mouth every 8 (eight) hours.     cilostazol 100 MG tablet  Commonly known as:  PLETAL  Take 100 mg by mouth 2 (two) times daily.     clopidogrel 75 MG tablet  Commonly known as:  PLAVIX  Take 75 mg by mouth every morning.     docusate sodium 100 MG capsule  Commonly known as:  COLACE  Take 100 mg by mouth 2 (two) times daily as needed  for constipation.     ferrous sulfate 325 (65 FE) MG tablet  Take 325 mg by mouth at bedtime.     furosemide 20 MG tablet  Commonly known as:  LASIX  Take 1 tablet (20 mg total) by mouth daily.     glucose blood test strip  TEST AS DIRECTED 3 TIMES DAILY     HYDROcodone-acetaminophen 5-500 MG per tablet  Commonly known as:  VICODIN  Take 2 tablets by mouth at bedtime.     insulin detemir 100 UNIT/ML injection  Commonly known as:  LEVEMIR  Inject 18 Units into the skin at bedtime. Uses Levemir Flexpen     levothyroxine 137 MCG tablet  Commonly known as:  SYNTHROID, LEVOTHROID  Take 137 mcg by mouth daily before breakfast.     magnesium oxide 400 MG tablet  Commonly known as:  MAG-OX  Take 800 mg by mouth at bedtime.     metoprolol succinate 25 MG 24 hr tablet  Commonly known as:  TOPROL-XL   Take 25 mg by mouth every morning.     nitrofurantoin (macrocrystal-monohydrate) 100 MG capsule  Commonly known as:  MACROBID     nitrofurantoin 50 MG capsule  Commonly known as:  MACRODANTIN  Take 50 mg by mouth at bedtime.     NOVOLOG FLEXPEN [place]  Inject 5 Units into the skin. Take 5 units before every meal     omeprazole 20 MG capsule  Commonly known as:  PRILOSEC  Take 20 mg by mouth daily.     oxybutynin 5 MG tablet  Commonly known as:  DITROPAN  Take 5 mg by mouth 3 (three) times daily.     rosuvastatin 20 MG tablet  Commonly known as:  CRESTOR  Take 20 mg by mouth daily.     sodium polystyrene 15 GM/60ML suspension  Commonly known as:  KAYEXALATE  Take 60 mLs (15 g total) by mouth once.     TRILIPIX 135 MG capsule  Generic drug:  Choline Fenofibrate  Take 135 mg by mouth at bedtime.     vitamin B-12 1000 MCG tablet  Commonly known as:  CYANOCOBALAMIN  Take 1,000 mcg by mouth every morning.     vitamin C 500 MG tablet  Commonly known as:  ASCORBIC ACID  Take 500 mg by mouth every morning.     Vitamin D (Ergocalciferol) 50000 UNITS Caps capsule  Commonly known as:  DRISDOL  Take 1 capsule (50,000 Units total) by mouth every 7 (seven) days. On thursdays        Allergies: No Known Allergies  Past Medical History  Diagnosis Date  . Diabetes mellitus without complication   . CHF (congestive heart failure)   . Coronary artery disease   . AAA (abdominal aortic aneurysm) 03/09/2012  . PVD (peripheral vascular disease)   . Carotid artery stenosis   . Hypertension   . Hyperlipidemia   . Hypothyroidism     Past Surgical History  Procedure Laterality Date  . Cardiac surgery    . Abdominal hysterectomy    . Tumor removal    . Coronary stent placement    . Femoral artery repair    . Cystoscopy w/ ureteral stent placement  03/10/2012    Procedure: CYSTOSCOPY WITH RETROGRADE PYELOGRAM/URETERAL STENT PLACEMENT;  Surgeon: Hanley Ben, MD;  Location: [facility];   Service: Urology;  Laterality: Left;    Family History  Problem Relation Age of Onset  . Congestive Heart Failure Mother   . Diabetes  Mother   . Stroke Father     Social History:  reports that she quit smoking about 3 years ago. She has never used smokeless tobacco. She reports that she does not drink alcohol or use illicit drugs.  Review of Systems - Cardiovascular ROS: positive for - CAD  Hyperlipidemia: Has history of high LDL and triglycerides   last labs: triglycerides 247 and LDL 110. She is compliant with her current doses of Crestor and Fenofibrate   HYPOTHYROIDISM: Her TSH was normal with the current regimen and she is compliant with the medication  No recent shortness of breath    Examination:   BP 122/62  Pulse 53  Temp(Src) 98.6 F (37 C)  Resp 14  Ht 5\' 6"  (1.676 m)  Wt 180 lb 14.4 oz (82.056 kg)  BMI 29.21 kg/m2  SpO2 96%  Body mass index is 29.21 kg/(m^2).   Assesment/PLAN:   . Diabetes mellitus, type II:  Her blood sugars are overall looking fairly good, now on18 units of Levemir  She is also taking NovoLog consistently for her mealtime coverage. She will continue the same insulin regimen Advised her to resume exercise on exercise bike    4. CKD (chronic kidney disease) -  her creatinine has been relatively higher and not clear why. Also has had high potassium possibly from hyponatremic up or does resume. Her blood pressure is not low today   5. 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 Will give her trial of Florinef 0.1 mg 3 times a week , to report if she has any leg swelling or shortness of breath  Office Visit on 11/08/2012  Component Date Value Range Status  . Hemoglobin A1C 11/08/2012 6.4  4.6 - 6.5 % Final   Glycemic Control Guidelines for People with  Diabetes:Non Diabetic:  <6%Goal of Therapy: <7%Additional Action Suggested:  >8%   . Sodium 11/08/2012 136  135 - 145 mEq/L Final  . Potassium 11/08/2012 5.9* 3.5 - 5.1 mEq/L Final  . Chloride 11/08/2012 105  96 - 112 mEq/L Final  . CO2 11/08/2012 26  19 - 32 mEq/L Final  . Glucose, Bld 11/08/2012 131* 70 - 99 mg/dL Final  . BUN 11/08/2012 34* 6 - 23 mg/dL Final  . Creatinine, Ser 11/08/2012 1.4* 0.4 - 1.2 mg/dL Final  . Total Bilirubin 11/08/2012 0.4  0.3 - 1.2 mg/dL Final  . Alkaline Phosphatase 11/08/2012 46  39 - 117 U/L Final  . AST 11/08/2012 21  0 - 37 U/L Final  . ALT 11/08/2012 12  0 - 35 U/L Final  . Total Protein 11/08/2012 6.6  6.0 - 8.3 g/dL Final  . Albumin 11/08/2012 3.5  3.5 - 5.2 g/dL Final  . Calcium 11/08/2012 9.2  8.4 - 10.5 mg/dL Final  . GFR 11/08/2012 41.02* >60.00 mL/min Final

## 2012-11-12 MED ORDER — FLUDROCORTISONE ACETATE 0.1 MG PO TABS: 0.1000 mg | ORAL_TABLET | Freq: Every day | ORAL | Status: DC

## 2012-11-12 NOTE — Addendum Note (Signed)
Addended by: [name] on: 11/12/2012 01:01 PM   Modules accepted: Orders

## 2012-11-13 ENCOUNTER — Telehealth: Payer: Self-pay | Admitting: *Deleted

## 2012-11-13 NOTE — Telephone Encounter (Signed)
Message copied by Roxanna Mew on Mon Nov 13, 2012  9:01 AM ------      Message from: [name]      Created: Sun Nov 12, 2012 11:57 AM       Her potassium is again high, confirm that she is on low potassium diet      Will try her on Florinef 0.1 mg to help with potassium, to take 3 days a week. May cause leg swelling and will need to let us know if this happens, needs repeat potassium level in one week ------

## 2012-11-13 NOTE — Telephone Encounter (Signed)
lmovm

## 2012-11-14 ENCOUNTER — Telehealth: Payer: Self-pay | Admitting: Endocrinology

## 2012-11-14 NOTE — Telephone Encounter (Signed)
Per pt, pharmacy is holding magnesium and Plavix script, awaiting a call from our office first. Please call pharmacy / Gayleen [place].

## 2012-11-16 ENCOUNTER — Encounter: Payer: Self-pay | Admitting: *Deleted

## 2012-11-16 ENCOUNTER — Telehealth: Payer: Self-pay | Admitting: *Deleted

## 2012-11-16 ENCOUNTER — Other Ambulatory Visit: Payer: Self-pay | Admitting: *Deleted

## 2012-11-16 MED ORDER — INSULIN DETEMIR 100 UNIT/ML FLEXPEN: PEN_INJECTOR | SUBCUTANEOUS | Status: DC

## 2012-11-16 NOTE — Telephone Encounter (Signed)
Called pt and asked her if she has begun the rx Florinef. Pt stated she began it on Tues. She also stated she scheduled a follow up appt.

## 2012-11-17 ENCOUNTER — Telehealth: Payer: Self-pay | Admitting: Endocrinology

## 2012-11-17 MED ORDER — HYDROCODONE-ACETAMINOPHEN 5-325 MG PO TABS: 1.0000 | ORAL_TABLET | Freq: Four times a day (QID) | ORAL | Status: DC | PRN

## 2012-11-17 NOTE — Telephone Encounter (Signed)
Please call [facility], need to clarify Vicodin prescriptions [phone] / Sherri S.

## 2012-11-20 ENCOUNTER — Encounter: Payer: Self-pay | Admitting: Cardiology

## 2012-11-20 ENCOUNTER — Ambulatory Visit (INDEPENDENT_AMBULATORY_CARE_PROVIDER_SITE_OTHER): Payer: Medicare Other | Admitting: Cardiology

## 2012-11-20 VITALS — BP 140/80 | HR 48 | Ht 66.0 in | Wt 180.0 lb

## 2012-11-20 DIAGNOSIS — E119 Type 2 diabetes mellitus without complications: Secondary | ICD-10-CM

## 2012-11-20 DIAGNOSIS — I2510 Atherosclerotic heart disease of native coronary artery without angina pectoris: Secondary | ICD-10-CM

## 2012-11-20 DIAGNOSIS — R001 Bradycardia, unspecified: Secondary | ICD-10-CM

## 2012-11-20 DIAGNOSIS — R0609 Other forms of dyspnea: Secondary | ICD-10-CM | POA: Diagnosis not present

## 2012-11-20 DIAGNOSIS — I509 Heart failure, unspecified: Secondary | ICD-10-CM

## 2012-11-20 DIAGNOSIS — I5042 Chronic combined systolic (congestive) and diastolic (congestive) heart failure: Secondary | ICD-10-CM

## 2012-11-20 DIAGNOSIS — I498 Other specified cardiac arrhythmias: Secondary | ICD-10-CM | POA: Diagnosis not present

## 2012-11-20 DIAGNOSIS — E785 Hyperlipidemia, unspecified: Secondary | ICD-10-CM

## 2012-11-20 DIAGNOSIS — I739 Peripheral vascular disease, unspecified: Secondary | ICD-10-CM | POA: Diagnosis not present

## 2012-11-20 DIAGNOSIS — R0989 Other specified symptoms and signs involving the circulatory and respiratory systems: Secondary | ICD-10-CM

## 2012-11-20 NOTE — Assessment & Plan Note (Signed)
See note.  Current labs.

## 2012-11-20 NOTE — Assessment & Plan Note (Signed)
See note

## 2012-11-20 NOTE — Patient Instructions (Addendum)
Decrease Metoprolol 25 MG to 1/2 tablet  a day  Please make an appointment for 1 week to recheck your EKG  Your physician has requested that you have a lower extremity arterial duplex for CLAUDICATION. This test is an ultrasound of the arteries in the legs . It looks at arterial blood flow in the legs . Allow one hour for Lower  Arterial scans. There are no restrictions or special instructions

## 2012-11-20 NOTE — Assessment & Plan Note (Signed)
Increasing claudication with known PAD and fem pop BPG

## 2012-11-20 NOTE — Assessment & Plan Note (Signed)
Heart rate 48 today.  Have decreased her bb.

## 2012-11-20 NOTE — Assessment & Plan Note (Signed)
Followed by Dr. Kumar.  ?

## 2012-11-20 NOTE — Progress Notes (Signed)
11/20/2012   PCP: [name], MD   Chief Complaint  Patient presents with  . Follow-up    pt. to see Dr. Bethel Born for SOB    Primary Cardiologist: Dr. Gwenlyn Found  HPI:  72 year old white divorced female followed by Dr. Gwenlyn Found for coronary artery disease which includes bypass grafting x6 in 1997 last cath by Dr. [name] in October 2011 with occluded vein graft to the OM and ramus branches, patent vein to PDA and patent LIMA to LAD. Her EF was 40% at that time.  Last echo was January 2012 with normal LV systolic function and a Myoview performed September 2013 revealed no ischemia. Other history does include peripheral vascular disease with bilateral carotid endarterectomies and a aortabifem bypass grafting and right femoropopliteal grafting and lifestyle limiting claudication.  In 2011 she also developed acute ischemia of her right leg and underwent thrombectomy of the right femoropopliteal bypass and thrombectomy of the right common femoral and profunda femoris artery by Dr. Oneida Alar.  Last lower extremity arterial Dopplers or July of 2012 ABI on the right 0.45 ABI on the left 0.59.  Last carotid Dopplers were in April of 2014 the right common carotid into the bulb and throughout the internal carotid artery demonstrated chronic occlusion left vertebral artery demonstrated abnormal flow, left bulb/proximal ICA the bulb and the proximal internal carotid artery status post endarterectomy demonstrated mild fibrous plaque elevating velocities within the proximal and mid segments of the internal carotid artery, consistent with a 50% diameter reduction. This was similar to previous Doppler studies.  She has dyslipidemia last cholesterol in July 2014 total cholesterol 180 triglycerides 247 HDL 38.9 and LDL 49.4.  She was hospitalized in April of this year with urinary tract infection treated with antibiotics.  Dr. Kellie Simmering follows her for GU and had placed a ureteral stent but that has now been  removed.  Today she does complain of dyspnea on exertion but states she's had a cold and inhaler as been helping.  She denies any chest pain at all.  Denies any edema.  She does complain of increasing claudication in her legs left greater than right she can only walk around the grocery store once and the pain begins.    No Known Allergies  Current Outpatient Prescriptions  Medication Sig Dispense Refill  . albuterol (PROVENTIL HFA;VENTOLIN HFA) 108 (90 BASE) MCG/ACT inhaler Inhale 2 puffs into the lungs every 6 (six) hours as needed for wheezing.  1 Inhaler  0  . aspirin 81 MG chewable tablet Chew 81 mg by mouth at bedtime.       . BD PEN NEEDLE NANO U/F 32G X 4 MM MISC       . calcium-vitamin D (OSCAL WITH D) 500-200 MG-UNIT per tablet Take 1 tablet by mouth every morning.      . Choline Fenofibrate (TRILIPIX) 135 MG capsule Take 135 mg by mouth at bedtime.      . cilostazol (PLETAL) 100 MG tablet Take 100 mg by mouth 2 (two) times daily.      . clopidogrel (PLAVIX) 75 MG tablet Take 75 mg by mouth every morning.       . docusate sodium (COLACE) 100 MG capsule Take 100 mg by mouth 2 (two) times daily as needed for constipation.      . ferrous sulfate 325 (65 FE) MG tablet Take 325 mg by mouth at bedtime.       . fludrocortisone (FLORINEF) 0.1 MG tablet Take 1 tablet (  0.1 mg total) by mouth daily.  15 tablet  3  . glucose blood test strip TEST AS DIRECTED 3 TIMES DAILY  100 each  4  . HYDROcodone-acetaminophen (NORCO/VICODIN) 5-325 MG per tablet Take 1 tablet by mouth every 6 (six) hours as needed for pain.  90 tablet  1  . Insulin Aspart (NOVOLOG FLEXPEN [place]) Inject 5 Units into the skin. Take 5 units before every meal      . Insulin Detemir (LEVEMIR FLEXTOUCH) 100 UNIT/ML SOPN INJECT 18 UNITS INTO THE SKIN AT BEDTIME  6 mL  2  . levothyroxine (SYNTHROID, LEVOTHROID) 137 MCG tablet Take 137 mcg by mouth daily before breakfast.      . losartan (COZAAR) 25 MG tablet Take 25 mg by mouth daily.       . magnesium oxide (MAG-OX) 400 MG tablet Take 800 mg by mouth at bedtime.       . metoprolol succinate (TOPROL-XL) 25 MG 24 hr tablet Take 25 mg by mouth every morning.       . nitrofurantoin (MACRODANTIN) 50 MG capsule Take 50 mg by mouth at bedtime.      Marland Kitchen omeprazole (PRILOSEC) 20 MG capsule Take 20 mg by mouth daily.      Marland Kitchen oxybutynin (DITROPAN) 5 MG tablet Take 5 mg by mouth at bedtime.       . rosuvastatin (CRESTOR) 20 MG tablet Take 20 mg by mouth daily.      . vitamin B-12 (CYANOCOBALAMIN) 1000 MCG tablet Take 1,000 mcg by mouth every morning.      . vitamin C (ASCORBIC ACID) 500 MG tablet Take 500 mg by mouth every morning.      . Vitamin D, Ergocalciferol, (DRISDOL) 50000 UNITS CAPS Take 1 capsule (50,000 Units total) by mouth every 7 (seven) days. On thursdays  4 capsule  12  . furosemide (LASIX) 20 MG tablet Take 20 mg by mouth.      . metFORMIN (GLUCOPHAGE) 500 MG tablet Take 500 mg by mouth 2 (two) times daily with a meal.       No current facility-administered medications for this visit.    Past Medical History  Diagnosis Date  . Diabetes mellitus without complication     type 2  . CHF (congestive heart failure)   . Coronary artery disease   . AAA (abdominal aortic aneurysm) 03/09/2012  . PVD (peripheral vascular disease)   . Carotid artery stenosis     carotid doppler 06/2012 - Right CCA/Bulb/ICA with chronic occlusion; L vertebral artery with abnormal blood flow; L Bulb/Prox ICA  s/p endarterectomy with mild fibrous plaque, 50% diameter reduction  . Hypertension   . Hyperlipidemia   . Hypothyroidism   . History of nuclear stress test 11/24/2011    lexiscan; nonischemic, low risk   . PAD (peripheral artery disease)     09/2010 LEAs - R ABI of 0.45, occluded fem-pop bypass graft, L ABI of 0.59 with occluded SFA; severe arterial insuff  . Ischemic cardiomyopathy     EF 03/2010 50-55%    Past Surgical History  Procedure Laterality Date  . Coronary artery bypass graft   1997    x6; internal mammary to LAD, SVG to ramus #1 & #2, SVG to OM, SVG to PDA,   . Abdominal hysterectomy  1986  . Tumor removal    . Coronary stent placement    . Femoral artery repair    . Cystoscopy w/ ureteral stent placement  03/10/2012    Procedure:  CYSTOSCOPY WITH RETROGRADE PYELOGRAM/URETERAL STENT PLACEMENT;  Surgeon: Hanley Ben, MD;  Location: [facility];  Service: Urology;  Laterality: Left;  . Carotid endarterectomy Bilateral   . Aortobifemoral bypass grafting  1997    with R fem-pop  . Cardiac catheterization  12/2009    occluded vein to OM & ramus branches, patent vein to PDA, patent LIMA to LAD (Dr. [name], [facility]) - later had thrombectomy of R fem-pop bypass ad R common femoral & profunda femoris artery (Dr. Oneida Alar)  . Transthoracic echocardiogram  03/25/2010    EF [id number], LV systolic function low normal with mild inferoseptal hypocontractility; LA mildly dilated; mod MR; mild TR, RV systolic pressure elevated, mild pulm HTN; AV mildly sclerotic; mild pulm valve regurg; aortic root sclerosis/calcif     [id number] colds or fevers, no weight changes Skin:no rashes or ulcers HEENT:no blurred vision, no congestion CV:see HPI PUL:see HPI GI:no diarrhea constipation or melena, no indigestion GU:no hematuria, no dysuria MS:no joint pain, + claudication Neuro:no syncope, no lightheadedness Endo:+ diabetes she believes her glucose to be stable, no thyroid disease  PHYSICAL EXAM BP 140/80  Pulse 48  Ht 5\' 6"  (1.676 m)  Wt 180 lb (81.647 kg)  BMI 29.07 kg/m2 General:Pleasant affect, NAD Skin:Warm and dry, brisk capillary refill HEENT:normocephalic, sclera clear, mucus membranes moist Neck:supple, no JVD, no bruits  Heart:S1S2 RRR without murmur, gallup, rub or click Lungs:clear without rales, rhonchi, or wheezes [id number], non tender, + BS, do not palpate liver spleen or masses Ext:no lower ext edema, 2+ radial pulses Neuro:alert and oriented, MAE, follows commands, +  facial symmetry  EKG:S Loletha Grayer with lateral appearing ischemia, reviewed with Dr. Sallyanne Kuster, this is actually similar to previous tracings with LVH and repolarization changes.  ASSESSMENT AND PLAN CAD (coronary artery disease) See note  DOE (dyspnea on exertion) See note  Chronic combined systolic and diastolic congestive heart failure See note  Claudication in peripheral vascular disease Increasing claudication with known PAD and fem pop BPG  Other and unspecified hyperlipidemia See note.  Current labs.  Diabetes mellitus, type II Followed by Dr. Dwyane Dee  Bradycardia Heart rate 48 today.  Have decreased her bb.   Patient has been dyspnea on exertion though she feels it's more related to pulmonary disease and has actually improved with the use of her inhaler. She is no longer on her diuretic but today she has no edema and no rales. If her shortness of breath continues it may be beneficial to repeat a nuclear stress test but for now we will allow her cold to resolve.  She does complain of increasing claudication bilaterally left greater than right though will reorder lower extreme the arterial Dopplers to evaluate. She'll follow with Dr. Gwenlyn Found if they're abnormal. Her dyslipidemia is controlled except for triglycerides followed by Dr. Dwyane Dee.   Her bradycardia is concerning the lowest I see on previous EKGs is 58. I am decreasing her metoprolol to 12.5 mg daily and we will see her back next week for EKG to check her pulse.  She denied any lightheadedness or dizziness.

## 2012-11-21 ENCOUNTER — Telehealth: Payer: Self-pay | Admitting: *Deleted

## 2012-11-21 ENCOUNTER — Other Ambulatory Visit (INDEPENDENT_AMBULATORY_CARE_PROVIDER_SITE_OTHER): Payer: Medicare Other

## 2012-11-21 ENCOUNTER — Other Ambulatory Visit: Payer: Self-pay | Admitting: *Deleted

## 2012-11-21 DIAGNOSIS — E875 Hyperkalemia: Secondary | ICD-10-CM

## 2012-11-21 LAB — BASIC METABOLIC PANEL
CO2: 29 mEq/L (ref 19–32)
Glucose, Bld: 72 mg/dL (ref 70–99)
Potassium: 5.3 mEq/L — ABNORMAL HIGH (ref 3.5–5.1)
Sodium: 139 mEq/L (ref 135–145)

## 2012-11-21 NOTE — Telephone Encounter (Signed)
Noted, pt is aware 

## 2012-11-21 NOTE — Telephone Encounter (Signed)
Message copied by Roxanna Mew on Tue Nov 21, 2012  4:49 PM ------      Message from: [name]      Created: Tue Nov 21, 2012  4:28 PM       Please let patient know that the potassium is improved and to continue low potassium diet and Florinef, need to see her back in 3 weeks with another BMP ------

## 2012-11-21 NOTE — Progress Notes (Signed)
Quick Note:  Please let patient know that the potassium is improved and to continue low potassium diet and Florinef, need to see her back in 3 weeks with another BMP ______

## 2012-11-27 ENCOUNTER — Encounter: Payer: Self-pay | Admitting: Cardiology

## 2012-11-27 ENCOUNTER — Ambulatory Visit (INDEPENDENT_AMBULATORY_CARE_PROVIDER_SITE_OTHER): Payer: Medicare Other | Admitting: Cardiology

## 2012-11-27 VITALS — BP 156/84 | HR 64 | Ht 66.0 in | Wt 185.9 lb

## 2012-11-27 DIAGNOSIS — R001 Bradycardia, unspecified: Secondary | ICD-10-CM

## 2012-11-27 DIAGNOSIS — I498 Other specified cardiac arrhythmias: Secondary | ICD-10-CM | POA: Diagnosis not present

## 2012-11-27 NOTE — Progress Notes (Signed)
Pt here today for EKG only to make sure heart rate improved.  It has rate now in the 60's.  Continue toprol at 12.5 mg daily.

## 2012-11-27 NOTE — Assessment & Plan Note (Addendum)
Resolved, keep Toprol at 12.5 mg daily.  EKG now stable and no change from 3/14.

## 2012-12-13 ENCOUNTER — Ambulatory Visit (HOSPITAL_COMMUNITY)
Admission: RE | Admit: 2012-12-13 | Discharge: 2012-12-13 | Disposition: A | Discharge status: Home / Self Care | Payer: Medicare Other | Source: Ambulatory Visit | Attending: Cardiology | Admitting: Cardiology

## 2012-12-13 ENCOUNTER — Telehealth: Payer: Self-pay | Admitting: Endocrinology

## 2012-12-13 DIAGNOSIS — E119 Type 2 diabetes mellitus without complications: Secondary | ICD-10-CM | POA: Diagnosis not present

## 2012-12-13 DIAGNOSIS — E785 Hyperlipidemia, unspecified: Secondary | ICD-10-CM | POA: Insufficient documentation

## 2012-12-13 DIAGNOSIS — I739 Peripheral vascular disease, unspecified: Secondary | ICD-10-CM

## 2012-12-13 DIAGNOSIS — I10 Essential (primary) hypertension: Secondary | ICD-10-CM | POA: Diagnosis not present

## 2012-12-13 DIAGNOSIS — I70219 Atherosclerosis of native arteries of extremities with intermittent claudication, unspecified extremity: Secondary | ICD-10-CM | POA: Diagnosis not present

## 2012-12-13 NOTE — Telephone Encounter (Signed)
Pt wanted to know if you still want her taking her potassium?

## 2012-12-13 NOTE — Progress Notes (Signed)
Lower Extremity Arterial Duplex Completed. °Brianna L Mazza,RVT °

## 2012-12-13 NOTE — Telephone Encounter (Signed)
Fludrocortisone: yes

## 2012-12-14 ENCOUNTER — Other Ambulatory Visit: Payer: Self-pay | Admitting: *Deleted

## 2012-12-14 ENCOUNTER — Encounter: Payer: Self-pay | Admitting: *Deleted

## 2012-12-14 ENCOUNTER — Other Ambulatory Visit: Payer: Medicare Other

## 2012-12-14 DIAGNOSIS — R0600 Dyspnea, unspecified: Secondary | ICD-10-CM

## 2012-12-14 MED ORDER — ALBUTEROL SULFATE HFA 108 (90 BASE) MCG/ACT IN AERS: 2.0000 | INHALATION_SPRAY | Freq: Four times a day (QID) | RESPIRATORY_TRACT | Status: DC | PRN

## 2012-12-14 NOTE — Progress Notes (Signed)
Quick Note:  Note sent to patient. ______

## 2012-12-15 DIAGNOSIS — H4011X0 Primary open-angle glaucoma, stage unspecified: Secondary | ICD-10-CM | POA: Diagnosis not present

## 2012-12-15 DIAGNOSIS — H409 Unspecified glaucoma: Secondary | ICD-10-CM | POA: Diagnosis not present

## 2012-12-18 ENCOUNTER — Ambulatory Visit: Payer: Medicare Other | Admitting: Endocrinology

## 2012-12-18 DIAGNOSIS — N1330 Unspecified hydronephrosis: Secondary | ICD-10-CM | POA: Diagnosis not present

## 2012-12-20 ENCOUNTER — Telehealth: Payer: Self-pay | Admitting: Cardiovascular Disease

## 2012-12-20 NOTE — Telephone Encounter (Signed)
Returning [place] call regarding test results

## 2012-12-20 NOTE — Telephone Encounter (Signed)
Spoke with patient and gave doppler results 

## 2013-01-03 ENCOUNTER — Ambulatory Visit (INDEPENDENT_AMBULATORY_CARE_PROVIDER_SITE_OTHER): Payer: Medicare Other | Admitting: Cardiovascular Disease

## 2013-01-03 ENCOUNTER — Encounter: Payer: Self-pay | Admitting: Cardiovascular Disease

## 2013-01-03 VITALS — BP 148/80 | HR 72 | Ht 66.0 in | Wt 185.0 lb

## 2013-01-03 DIAGNOSIS — Z79899 Other long term (current) drug therapy: Secondary | ICD-10-CM | POA: Diagnosis not present

## 2013-01-03 DIAGNOSIS — E782 Mixed hyperlipidemia: Secondary | ICD-10-CM | POA: Diagnosis not present

## 2013-01-03 DIAGNOSIS — I2510 Atherosclerotic heart disease of native coronary artery without angina pectoris: Secondary | ICD-10-CM

## 2013-01-03 DIAGNOSIS — I739 Peripheral vascular disease, unspecified: Secondary | ICD-10-CM | POA: Diagnosis not present

## 2013-01-03 DIAGNOSIS — I10 Essential (primary) hypertension: Secondary | ICD-10-CM

## 2013-01-03 DIAGNOSIS — I779 Disorder of arteries and arterioles, unspecified: Secondary | ICD-10-CM | POA: Insufficient documentation

## 2013-01-03 NOTE — Assessment & Plan Note (Signed)
Duplex ultrasound performed/17/14 revealed an occluded right common, internal and external carotid artery and moderate left ICA stenosis. This will be checked on an annual basis. She was neurologically asymptomatic.

## 2013-01-03 NOTE — Patient Instructions (Signed)
Your physician recommends that you return for lab work fasting. Do not eat or drink past midnight the night before going. You do not need an appointment. The lab opens at 8:00 a.m.  Your physician recommends that you schedule a follow-up appointment in: 6 months with Cecilie Kicks PA-C and 1 year with Dr. Gwenlyn Found.

## 2013-01-03 NOTE — Progress Notes (Signed)
01/03/2013 [name] [name]   [birth year]  [id number]  Primary Physician [name], MD Primary Cardiologist: [name] MD [name] [name]   HPI:  The patient is a 72 year old, mildly overweight, divorced Caucasian female, mother of 1 child who I last saw in the office 6 months ago. She has a history of CAD status post coronary artery bypass grafting x6 in 1997. She has had bilateral carotid endarterectomies, aortobifemoral bypass grafting and right fem-pop bypass grafting with lifestyle-limiting claudication. Cath performed by Dr. [name] December 09, 2009, revealed an occluded vein to an OM and ramus branches with a patent vein to a PDA and a patent LIMA to the LAD. Her EF was 40% at that time. She quit smoking November 2011. She had an echo performed March 25, 2010, that showed normal LV systolic function and a Myoview performed November 24, 2011, which was nonischemic. She denies chest pain or shortness of breath. Her last lower extremity Dopplers performed in our office October 05, 2011, revealed a right ABI of 0.45 with an occluded fem-pop bypass graft and a left ABI of 0.59 with an occluded left SFA.since I saw her back 7 months ago she's remained clinically stable. She denies chest pain or shortness of breath. Her lower extremity arterial Dopplers show progression of disease on the right but though I do not think she is revascularizable given her comorbidities.    Current Outpatient Prescriptions  Medication Sig Dispense Refill  . albuterol (PROVENTIL HFA;VENTOLIN HFA) 108 (90 BASE) MCG/ACT inhaler Inhale 2 puffs into the lungs every 6 (six) hours as needed for wheezing.  1 Inhaler  5  . aspirin 81 MG chewable tablet Chew 81 mg by mouth at bedtime.       . BD PEN NEEDLE NANO U/F 32G X 4 MM MISC       . calcium-vitamin D (OSCAL WITH D) 500-200 MG-UNIT per tablet Take 1 tablet by mouth every morning.      . Choline Fenofibrate (TRILIPIX) 135 MG capsule Take 135 mg by mouth at  bedtime.      . cilostazol (PLETAL) 100 MG tablet Take 100 mg by mouth 2 (two) times daily.      . clopidogrel (PLAVIX) 75 MG tablet Take 75 mg by mouth every morning.       . docusate sodium (COLACE) 100 MG capsule Take 100 mg by mouth 2 (two) times daily as needed for constipation.      . ferrous sulfate 325 (65 FE) MG tablet Take 325 mg by mouth at bedtime.       . fludrocortisone (FLORINEF) 0.1 MG tablet Take 1 tablet (0.1 mg total) by mouth daily.  15 tablet  3  . glucose blood test strip TEST AS DIRECTED 3 TIMES DAILY  100 each  4  . HYDROcodone-acetaminophen (NORCO/VICODIN) 5-325 MG per tablet Take 1 tablet by mouth every 6 (six) hours as needed for pain.  90 tablet  1  . Insulin Aspart (NOVOLOG FLEXPEN [place]) Inject 5 Units into the skin. Take 5 units before every meal      . Insulin Detemir (LEVEMIR FLEXTOUCH) 100 UNIT/ML SOPN INJECT 18 UNITS INTO THE SKIN AT BEDTIME  6 mL  2  . levothyroxine (SYNTHROID, LEVOTHROID) 137 MCG tablet Take 137 mcg by mouth daily before breakfast.      . magnesium oxide (MAG-OX) 400 MG tablet Take 800 mg by mouth at bedtime.       . metoprolol succinate (TOPROL-XL) 25 MG 24  hr tablet Take 12.5 mg by mouth every morning.       Marland Kitchen omeprazole (PRILOSEC) 20 MG capsule Take 20 mg by mouth daily.      Marland Kitchen oxybutynin (DITROPAN) 5 MG tablet Take 5 mg by mouth at bedtime.       . rosuvastatin (CRESTOR) 20 MG tablet Take 20 mg by mouth daily.      . vitamin B-12 (CYANOCOBALAMIN) 1000 MCG tablet Take 1,000 mcg by mouth every morning.      . vitamin C (ASCORBIC ACID) 500 MG tablet Take 500 mg by mouth every morning.      . Vitamin D, Ergocalciferol, (DRISDOL) 50000 UNITS CAPS Take 1 capsule (50,000 Units total) by mouth every 7 (seven) days. On thursdays  4 capsule  12   No current facility-administered medications for this visit.    No Known Allergies  History   Social History  . Marital Status: Divorced    Spouse Name: N/A    Number of Children: 1  . Years of  Education: 12   Occupational History  . Not on file.   Social History Main Topics  . Smoking status: Former Smoker -- 2.00 packs/day for 25 years    Quit date: 06/07/2009  . Smokeless tobacco: Never Used  . Alcohol Use: No  . Drug Use: No  . Sexual Activity: Not on file   Other Topics Concern  . Not on file   Social History Narrative  . No narrative on file     Review of Systems: General: negative for chills, fever, night sweats or weight changes.  Cardiovascular: negative for chest pain, dyspnea on exertion, edema, orthopnea, palpitations, paroxysmal nocturnal dyspnea or shortness of breath Dermatological: negative for rash Respiratory: negative for cough or wheezing Urologic: negative for hematuria Abdominal: negative for nausea, vomiting, diarrhea, bright red blood per rectum, melena, or hematemesis Neurologic: negative for visual changes, syncope, or dizziness All other systems reviewed and are otherwise negative except as noted above.    Blood pressure 148/80, pulse 72, height 5\' 6"  (1.676 m), weight 185 lb (83.915 kg).  General appearance: alert and no distress Neck: no adenopathy, no JVD, supple, symmetrical, trachea midline, thyroid not enlarged, symmetric, no tenderness/mass/nodules and left carotid bruit Lungs: clear to auscultation bilaterally Heart: regular rate and rhythm, S1, S2 normal, no murmur, click, rub or gallop Extremities: extremities normal, atraumatic, no cyanosis or edema  EKG not performed today  ASSESSMENT AND PLAN:   CAD (coronary artery disease) Status post coronary artery bypass grafting x6 in 1997. She had her last catheterization 12/09/09 by Dr. [name] revealing an occluded vein to an OM and ramus branch with a patent vein to PDA and a patent LIMA to the LAD. It. Was 40% at that time. Her last Myoview stress test performed 11/24/11 was nonischemic. She currently denies chest pain or shortness of breath.  Claudication in peripheral vascular  disease Status post aortobifemoral bypass grafting in the past as well as right femoropopliteal bypass grafting. Her left lower extremity arterial Doppler study performed 12/13/12 revealed a right ABI of 0.38 a left ABI of 0.58. Her superficial femoral arteries were occluded bilaterally and her right popliteal was occluded as well. She does complain of claudication.  Carotid artery disease Duplex ultrasound performed/17/14 revealed an occluded right common, internal and external carotid artery and moderate left ICA stenosis. This will be checked on an annual basis. She was neurologically asymptomatic.  HTN (hypertension) Well-controlled on current medications  [name] MD FACP,FACC,FAHA, [facility] 01/03/2013 3:20 PM

## 2013-01-03 NOTE — Assessment & Plan Note (Signed)
Well-controlled on current medications 

## 2013-01-03 NOTE — Assessment & Plan Note (Signed)
Status post coronary artery bypass grafting x6 in 1997. She had her last catheterization 12/09/09 by Dr. [name] revealing an occluded vein to an OM and ramus branch with a patent vein to PDA and a patent LIMA to the LAD. It. Was 40% at that time. Her last Myoview stress test performed 11/24/11 was nonischemic. She currently denies chest pain or shortness of breath.

## 2013-01-03 NOTE — Assessment & Plan Note (Signed)
Status post aortobifemoral bypass grafting in the past as well as right femoropopliteal bypass grafting. Her left lower extremity arterial Doppler study performed 12/13/12 revealed a right ABI of 0.38 a left ABI of 0.58. Her superficial femoral arteries were occluded bilaterally and her right popliteal was occluded as well. She does complain of claudication.

## 2013-01-04 ENCOUNTER — Telehealth: Payer: Self-pay | Admitting: Cardiovascular Disease

## 2013-01-04 ENCOUNTER — Other Ambulatory Visit: Payer: Self-pay | Admitting: *Deleted

## 2013-01-04 ENCOUNTER — Other Ambulatory Visit: Payer: Self-pay | Admitting: Endocrinology

## 2013-01-04 ENCOUNTER — Other Ambulatory Visit: Payer: Medicare Other

## 2013-01-04 DIAGNOSIS — E039 Hypothyroidism, unspecified: Secondary | ICD-10-CM

## 2013-01-04 DIAGNOSIS — E119 Type 2 diabetes mellitus without complications: Secondary | ICD-10-CM

## 2013-01-04 LAB — COMPREHENSIVE METABOLIC PANEL
Alkaline Phosphatase: 60 U/L (ref 39–117)
BUN: 26 mg/dL — ABNORMAL HIGH (ref 6–23)
Calcium: 10 mg/dL (ref 8.4–10.5)
Chloride: 104 mEq/L (ref 96–112)
Creat: 1.35 mg/dL — ABNORMAL HIGH (ref 0.50–1.10)
Glucose, Bld: 138 mg/dL — ABNORMAL HIGH (ref 70–99)
Total Bilirubin: 0.3 mg/dL (ref 0.3–1.2)
Total Protein: 6.5 g/dL (ref 6.0–8.3)

## 2013-01-04 LAB — T4, FREE: Free T4: 1.36 ng/dL (ref 0.80–1.80)

## 2013-01-04 LAB — TSH: TSH: 1.522 u[IU]/mL (ref 0.350–4.500)

## 2013-01-04 NOTE — Telephone Encounter (Signed)
Saw Dr berry yesterday  Has some questions about blockages in leg  Please call  Asked for [facility]

## 2013-01-05 LAB — URINALYSIS, ROUTINE W REFLEX MICROSCOPIC
Bilirubin Urine: NEGATIVE
Glucose, UA: NEGATIVE mg/dL
Hgb urine dipstick: NEGATIVE
Nitrite: NEGATIVE
Protein, ur: 100 mg/dL — AB
Urobilinogen, UA: 0.2 mg/dL (ref 0.0–1.0)

## 2013-01-05 LAB — URINALYSIS, MICROSCOPIC ONLY
Casts: NONE SEEN
Crystals: NONE SEEN

## 2013-01-05 LAB — URINALYSIS
Glucose, UA: NEGATIVE mg/dL
Hgb urine dipstick: NEGATIVE
Ketones, ur: NEGATIVE mg/dL
Leukocytes, UA: NEGATIVE
Protein, ur: 100 mg/dL — AB

## 2013-01-05 LAB — MICROALBUMIN / CREATININE URINE RATIO: Microalb, Ur: 46.8 mg/dL — ABNORMAL HIGH (ref 0.00–1.89)

## 2013-01-05 LAB — HEMOGLOBIN A1C: Hgb A1c MFr Bld: 6.6 % — ABNORMAL HIGH (ref <5.7)

## 2013-01-05 NOTE — Telephone Encounter (Signed)
I spoke with patient and answered her questions based on the office note.

## 2013-01-05 NOTE — Telephone Encounter (Signed)
Returning your call. °

## 2013-01-05 NOTE — Telephone Encounter (Signed)
lmom 

## 2013-01-09 ENCOUNTER — Ambulatory Visit: Payer: Medicare Other | Admitting: Endocrinology

## 2013-01-10 ENCOUNTER — Encounter: Payer: Self-pay | Admitting: Endocrinology

## 2013-01-10 ENCOUNTER — Ambulatory Visit (INDEPENDENT_AMBULATORY_CARE_PROVIDER_SITE_OTHER): Payer: Medicare Other | Admitting: Endocrinology

## 2013-01-10 VITALS — BP 132/72 | HR 72 | Temp 98.3°F | Resp 12 | Ht 66.0 in | Wt 185.2 lb

## 2013-01-10 DIAGNOSIS — E1129 Type 2 diabetes mellitus with other diabetic kidney complication: Secondary | ICD-10-CM | POA: Diagnosis not present

## 2013-01-10 DIAGNOSIS — D649 Anemia, unspecified: Secondary | ICD-10-CM

## 2013-01-10 DIAGNOSIS — N390 Urinary tract infection, site not specified: Secondary | ICD-10-CM

## 2013-01-10 MED ORDER — HYDROCODONE-ACETAMINOPHEN 5-325 MG PO TABS: 1.0000 | ORAL_TABLET | Freq: Four times a day (QID) | ORAL | Status: DC | PRN

## 2013-01-10 MED ORDER — ALISKIREN FUMARATE 150 MG PO TABS: 150.0000 mg | ORAL_TABLET | Freq: Every day | ORAL | Status: DC

## 2013-01-10 NOTE — Progress Notes (Signed)
Patient ID: [name] [name], female   DOB: 1940-06-05, 72 y.o.   MRN: [id number]    Reason for Appointment: Diabetes follow-up   History of Present Illness   Diagnosis: Type 2 diabetes mellitus, date of diagnosis: 1986.   The patient is seen today in follow up of Type 2 diabetes which has been treated with low dose basal bolus insulin regimen. She is not taking any oral hypoglycemic drugs at this time.  Her blood sugars are  looking fairly good but blood sugars are periodically higher both at breakfast and suppertime. Usually not checking readings after meals The lowest HbgA1c recently was 6.3, on , 07/28/12.  The insulin regimen is described as Levemir 18 units hs, Novolog 5 ac and she is compliant with her regimen.   Side effects from medications: Diarrhea from metformin and nausea and vomiting from GLP-1 drugs  Monitors blood glucose: Less than once a day.  Glucometer: One Touch.  Blood Glucose readings: Fasting 121-196 with median about 140, around supper time 128-198 and at bedtime 199 Overall median 154 Hypoglycemia frequency: None recently.   Meals: she is trying to consume low fat meals ; meals 11.30 AM,and Light supper at 5 pm.  Calorie intake: small supper, balanced lunch which is her main meal.  Physical activity: exercise: a little on exercise bike (legs weak)  Dietician visit: Most recent:, 5/13.   Wt Readings from Last 3 Encounters:  01/10/13 185 lb 3.2 oz (84.006 kg)  01/03/13 185 lb (83.915 kg)  11/27/12 185 lb 14.4 oz (84.324 kg)    Lab Results  Component Value Date   HGBA1C 6.6* 01/04/2013   2. Cloudy urine 2-3 days: She has not had any burning or discomfort but is concerned about her urine having an odor and getting cloudy this week. She has had recurrent infections but her urine exam last Thursday was quite clear. She is not on any prophylactic antibiotic and sees her urologist every 6 months. Also has history of overactive bladder taking oxybutynin  CKD: Her  last creatinine was 1.5, and is now improved. Also has history ofhyperkalemia which was treated with Kayexalate.  HYPOKALEMIA: Because of persistent mild increase in potassium she was started on Florinef 3 times a week and she is having no edema with this. Potassium is normal but still relatively high  HYPOTHYROIDISM: She has had long-standing primary hypothyroidism and this is adequately replaced with last TSH normal     Medication List       This list is accurate as of: 01/10/13  1:45 PM.  Always use your most recent med list.               albuterol 108 (90 BASE) MCG/ACT inhaler  Commonly known as:  PROVENTIL HFA;VENTOLIN HFA  Inhale 2 puffs into the lungs every 6 (six) hours as needed for wheezing.     aspirin 81 MG chewable tablet  Chew 81 mg by mouth at bedtime.     BD PEN NEEDLE NANO U/F 32G X 4 MM Misc  Generic drug:  Insulin Pen Needle     calcium-vitamin D 500-200 MG-UNIT per tablet  Commonly known as:  OSCAL WITH D  Take 1 tablet by mouth every morning.     cilostazol 100 MG tablet  Commonly known as:  PLETAL  Take 100 mg by mouth 2 (two) times daily.     clopidogrel 75 MG tablet  Commonly known as:  PLAVIX  Take 75 mg by mouth every morning.  docusate sodium 100 MG capsule  Commonly known as:  COLACE  Take 100 mg by mouth 2 (two) times daily as needed for constipation.     ferrous sulfate 325 (65 FE) MG tablet  Take 325 mg by mouth at bedtime.     fludrocortisone 0.1 MG tablet  Commonly known as:  FLORINEF  Take 1 tablet (0.1 mg total) by mouth daily.     glucose blood test strip  TEST AS DIRECTED 3 TIMES DAILY     HYDROcodone-acetaminophen 5-325 MG per tablet  Commonly known as:  NORCO/VICODIN  Take 1 tablet by mouth every 6 (six) hours as needed for pain.     Insulin Detemir 100 UNIT/ML Sopn  Commonly known as:  LEVEMIR FLEXTOUCH  INJECT 18 UNITS INTO THE SKIN AT BEDTIME     levothyroxine 137 MCG tablet  Commonly known as:  SYNTHROID,  LEVOTHROID  Take 137 mcg by mouth daily before breakfast.     magnesium oxide 400 MG tablet  Commonly known as:  MAG-OX  Take 800 mg by mouth at bedtime.     metoprolol succinate 25 MG 24 hr tablet  Commonly known as:  TOPROL-XL  Take 12.5 mg by mouth every morning.     NOVOLOG FLEXPEN [place]  Inject 5 Units into the skin. Take 5 units before every meal     omeprazole 20 MG capsule  Commonly known as:  PRILOSEC  Take 20 mg by mouth daily.     oxybutynin 5 MG tablet  Commonly known as:  DITROPAN  Take 5 mg by mouth at bedtime.     rosuvastatin 20 MG tablet  Commonly known as:  CRESTOR  Take 20 mg by mouth daily.     TRILIPIX 135 MG capsule  Generic drug:  Choline Fenofibrate  Take 135 mg by mouth at bedtime.     vitamin B-12 1000 MCG tablet  Commonly known as:  CYANOCOBALAMIN  Take 1,000 mcg by mouth every morning.     vitamin C 500 MG tablet  Commonly known as:  ASCORBIC ACID  Take 500 mg by mouth every morning.     Vitamin D (Ergocalciferol) 50000 UNITS Caps capsule  Commonly known as:  DRISDOL  Take 1 capsule (50,000 Units total) by mouth every 7 (seven) days. On thursdays        Allergies: No Known Allergies  Past Medical History  Diagnosis Date  . Diabetes mellitus without complication     type 2  . CHF (congestive heart failure)   . Coronary artery disease   . AAA (abdominal aortic aneurysm) 03/09/2012  . PVD (peripheral vascular disease)   . Carotid artery stenosis     carotid doppler 06/2012 - Right CCA/Bulb/ICA with chronic occlusion; L vertebral artery with abnormal blood flow; L Bulb/Prox ICA  s/p endarterectomy with mild fibrous plaque, 50% diameter reduction  . Hypertension   . Hyperlipidemia   . Hypothyroidism   . History of nuclear stress test 11/24/2011    lexiscan; nonischemic, low risk   . PAD (peripheral artery disease)     09/2010 [facility]As - R ABI of 0.45, occluded fem-pop bypass graft, L ABI of 0.59 with occluded SFA; severe arterial insuff  .  Ischemic cardiomyopathy     EF 03/2010 50-55%    Past Surgical History  Procedure Laterality Date  . Coronary artery bypass graft  1997    x6; internal mammary to LAD, SVG to ramus #1 & #2, SVG to OM, SVG to PDA,   .  Abdominal hysterectomy  1986  . Tumor removal    . Coronary stent placement    . Femoral artery repair    . Cystoscopy w/ ureteral stent placement  03/10/2012    Procedure: CYSTOSCOPY WITH RETROGRADE PYELOGRAM/URETERAL STENT PLACEMENT;  Surgeon: Hanley Ben, MD;  Location: [facility];  Service: Urology;  Laterality: Left;  . Carotid endarterectomy Bilateral   . Aortobifemoral bypass grafting  1997    with R fem-pop  . Cardiac catheterization  12/2009    occluded vein to OM & ramus branches, patent vein to PDA, patent LIMA to LAD (Dr. [name], [facility]) - later had thrombectomy of R fem-pop bypass ad R common femoral & profunda femoris artery (Dr. Oneida Alar)  . Transthoracic echocardiogram  03/25/2010    EF [id number], LV systolic function low normal with mild inferoseptal hypocontractility; LA mildly dilated; mod MR; mild TR, RV systolic pressure elevated, mild pulm HTN; AV mildly sclerotic; mild pulm valve regurg; aortic root sclerosis/calcif     Family History  Problem Relation Age of Onset  . Congestive Heart Failure Mother   . Diabetes Mother   . Stroke Father     Social History:  reports that she quit smoking about 3 years ago. She has never used smokeless tobacco. She reports that she does not drink alcohol or use illicit drugs.  Review of Systems - Cardiovascular ROS: positive for - CAD  Hyperlipidemia: Has history of high LDL and triglycerides   last labs: triglycerides 247 and LDL 110. She is compliant with her current doses of 20 mg Crestor and Trilipix Her doses were not changed on the last visit  Lab Results  Component Value Date   CHOL 180 10/02/2012   HDL 38.90* 10/02/2012   LDLCALC  Value: 39        Total Cholesterol/HDL:CHD Risk Coronary Heart Disease Risk Table                      Men   Women  1/2 Average Risk   3.4   3.3  Average Risk       5.0   4.4  2 X Average Risk   9.6   7.1  3 X Average Risk  23.4   11.0        Use the calculated Patient Ratio above and the CHD Risk Table to determine the patient's CHD Risk.        ATP III CLASSIFICATION (LDL):  <100     mg/dL   Optimal  100-129  mg/dL   Near or Above                    Optimal  130-159  mg/dL   Borderline  160-189  mg/dL   High  >190     mg/dL   Very High 01/12/2010   LDLDIRECT 110.4 10/02/2012   TRIG 247.0* 10/02/2012   CHOLHDL 5 10/02/2012    Iron Deficiency:  Has had recurrent anemia with hemoglobin  Previous history: In 8/13 she had low iron saturation and was 19 in 2/14. Also taking B12. With iron infusion her hemoglobin was back up to 11.3.  Hemoccult was negative in 9/13. Most recent hemoglobin was back to normal at 12.1  No recent shortness of breath    LABS:  Orders Only on 01/04/2013  Component Date Value Range Status  . Squamous Epithelial / LPF 01/04/2013 RARE  RARE Final  . Crystals 01/04/2013 NONE SEEN  NONE SEEN Final  .  Casts 01/04/2013 NONE SEEN  NONE SEEN Final  . WBC, UA 01/04/2013 0-2  <3 WBC/hpf Final  . RBC / HPF 01/04/2013 0-2  <3 RBC/hpf Final  . Bacteria, UA 01/04/2013 RARE  RARE Final  . Microalb, Ur 01/04/2013 46.80* 0.00 - 1.89 mg/dL Final   Comment: Result repeated and verified.                          Result confirmed by automatic dilution.  . Creatinine, Urine 01/04/2013 53.2   Final  . Microalb Creat Ratio 01/04/2013 879.7* 0.0 - 30.0 mg/g Final  . Sodium 01/04/2013 141  135 - 145 mEq/L Final  . Potassium 01/04/2013 5.1  3.5 - 5.3 mEq/L Final  . Chloride 01/04/2013 104  96 - 112 mEq/L Final  . CO2 01/04/2013 27  19 - 32 mEq/L Final  . Glucose, Bld 01/04/2013 138* 70 - 99 mg/dL Final  . BUN 01/04/2013 26* 6 - 23 mg/dL Final  . Creat 01/04/2013 1.35* 0.50 - 1.10 mg/dL Final  . Total Bilirubin 01/04/2013 0.3  0.3 - 1.2 mg/dL Final  . Alkaline Phosphatase  01/04/2013 60  39 - 117 U/L Final  . AST 01/04/2013 18  0 - 37 U/L Final  . ALT 01/04/2013 11  0 - 35 U/L Final  . Total Protein 01/04/2013 6.5  6.0 - 8.3 g/dL Final  . Albumin 01/04/2013 3.6  3.5 - 5.2 g/dL Final  . Calcium 01/04/2013 10.0  8.4 - 10.5 mg/dL Final  . TSH 01/04/2013 1.522  0.350 - 4.500 uIU/mL Final  . Free T4 01/04/2013 1.36  0.80 - 1.80 ng/dL Final  . Hemoglobin A1C 01/04/2013 6.6* <5.7 % Final   Comment:                                                                                                 According to the ADA Clinical Practice Recommendations for 2011, when                          HbA1c is used as a screening test:                                                       >=6.5%   Diagnostic of Diabetes Mellitus                                     (if abnormal result is confirmed)                                                     5.7-6.4%   Increased risk of developing Diabetes Mellitus  References:Diagnosis and Classification of Diabetes Mellitus,Diabetes                          [id number] 1):S62-S69 and Standards of Medical Care in                                  Diabetes - 2011,Diabetes [id number] (Suppl 1):S11-S61.                             . Mean Plasma Glucose 01/04/2013 143* <117 mg/dL Final  . Color, Urine 01/04/2013 YELLOW  YELLOW Final  . APPearance 01/04/2013 CLEAR  CLEAR Final  . Specific Gravity, Urine 01/04/2013 1.013  1.005 - 1.030 Final  . pH 01/04/2013 6.5  5.0 - 8.0 Final  . Glucose, UA 01/04/2013 NEG  NEG mg/dL Final  . Bilirubin Urine 01/04/2013 NEG  NEG Final  . Ketones, ur 01/04/2013 NEG  NEG mg/dL Final  . Hgb urine dipstick 01/04/2013 NEG  NEG Final  . Protein, ur 01/04/2013 100* NEG mg/dL Final  . Urobilinogen, UA 01/04/2013 0.2  0.0 - 1.0 mg/dL Final  . Nitrite 01/04/2013 NEG  NEG Final  . Leukocytes, UA 01/04/2013 NEG  NEG Final  . Color, Urine 01/04/2013 YELLOW   YELLOW Final  . APPearance 01/04/2013 CLEAR  CLEAR Final  . Specific Gravity, Urine 01/04/2013 1.013  1.005 - 1.030 Final  . pH 01/04/2013 6.5  5.0 - 8.0 Final  . Glucose, UA 01/04/2013 NEG  NEG mg/dL Final  . Bilirubin Urine 01/04/2013 NEG  NEG Final  . Ketones, ur 01/04/2013 NEG  NEG mg/dL Final  . Hgb urine dipstick 01/04/2013 NEG  NEG Final  . Protein, ur 01/04/2013 100* NEG mg/dL Final  . Urobilinogen, UA 01/04/2013 0.2  0.0 - 1.0 mg/dL Final  . Nitrite 01/04/2013 NEG  NEG Final  . Leukocytes, UA 01/04/2013 NEG  NEG Final      Examination:   BP 132/72  Pulse 72  Temp(Src) 98.3 F (36.8 C)  Resp 12  Ht 5\' 6"  (1.676 m)  Wt 185 lb 3.2 oz (84.006 kg)  BMI 29.91 kg/m2  SpO2 92%  Body mass index is 29.91 kg/(m^2).   Assesment/PLAN:   1. DIABETES: Although her A1c is upper normal she is having frequently high readings in the afternoon again and occasionally after supper. She thinks this is from variability in her diet but is usually not checking blood sugars after supper. Will go ahead and increase her NovoLog to at least 6 units and have her check more readings about 2 hours after meals. Since her fasting readings are occasionally as low as 120 will continue the same dose of Levemir. Also encouraged her to start exercising on exercise bike daily which she is doing only occasionally. She does need to lose weight and this has leveled off now 2. 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 She is doing somewhat better with taking Florinef 3 times a week and no complications of edema. Will continue the same dose 3. CKD: Her creatinine is slightly better, may be from fluid expansion. However her blood pressure is high normal and she is now having significant proteinuria. Will give her a trial of a half a tablet of 150 mg Tekturna and watch her renal function and potassium with this 4. Hyperlipidemia: Since her last LDL was over 100 will  increase her Crestor to 40  mg; her renal function is improved 5. History of anemia: Will need followup on the next visit 6. HYPOTHYROIDISM: Her TSH is consistently good on the current dose now 7. ? UTI. Her urine was normal last week. She is now reporting cloudy urine for the last couple of days and will check her culture. Consider followup with urologist if she continues to have recurrent infections 8. Microalbuminuria: She has significant increase in urine microalbumin which is new. Since her blood pressure is upper normal will try her on 75 mg of Tekturna, will not be able to take ARB or ACE inhibitors because of hyperkalemia

## 2013-01-10 NOTE — Patient Instructions (Addendum)
Crestor 40mg  daily (2 of 20mg  )  Tekturna 150mg , 1/2 daily  Exercise bike daily  All others same  Please check blood sugars at least half the time about 2 hours after any meal and as directed on waking up. Please bring blood sugar monitor to each visit  NOVOLOG 6 UNITS AT BREAKFAST AND SUPPER

## 2013-01-11 LAB — URINE CULTURE: Organism ID, Bacteria: NO GROWTH

## 2013-01-11 NOTE — Progress Notes (Signed)
Quick Note:  Please let patient know that the lab result is negative and no further action needed ______

## 2013-01-12 DIAGNOSIS — H409 Unspecified glaucoma: Secondary | ICD-10-CM | POA: Diagnosis not present

## 2013-01-12 DIAGNOSIS — H4011X0 Primary open-angle glaucoma, stage unspecified: Secondary | ICD-10-CM | POA: Diagnosis not present

## 2013-01-24 ENCOUNTER — Other Ambulatory Visit: Payer: Self-pay | Admitting: *Deleted

## 2013-01-24 MED ORDER — INSULIN ASPART 100 UNIT/ML FLEXPEN: 5.0000 [IU] | PEN_INJECTOR | Freq: Three times a day (TID) | SUBCUTANEOUS | Status: DC

## 2013-02-05 ENCOUNTER — Telehealth: Payer: Self-pay | Admitting: Endocrinology

## 2013-02-05 ENCOUNTER — Other Ambulatory Visit: Payer: Self-pay | Admitting: *Deleted

## 2013-02-05 MED ORDER — OMEPRAZOLE 20 MG PO CPDR: 20.0000 mg | DELAYED_RELEASE_CAPSULE | Freq: Every day | ORAL | Status: DC

## 2013-02-05 MED ORDER — CHOLINE FENOFIBRATE 135 MG PO CPDR: 135.0000 mg | DELAYED_RELEASE_CAPSULE | Freq: Every day | ORAL | Status: DC

## 2013-02-05 MED ORDER — ROSUVASTATIN CALCIUM 40 MG PO TABS: 20.0000 mg | ORAL_TABLET | Freq: Every day | ORAL | Status: DC

## 2013-02-05 NOTE — Telephone Encounter (Signed)
Needs to speak with nurse Call back [phone]  Thank you :)

## 2013-02-07 ENCOUNTER — Other Ambulatory Visit: Payer: Self-pay | Admitting: *Deleted

## 2013-02-07 MED ORDER — OMEPRAZOLE 20 MG PO CPDR: 20.0000 mg | DELAYED_RELEASE_CAPSULE | Freq: Every day | ORAL | Status: DC

## 2013-02-07 MED ORDER — ROSUVASTATIN CALCIUM 40 MG PO TABS: ORAL_TABLET | ORAL | Status: DC

## 2013-02-09 ENCOUNTER — Encounter: Payer: Self-pay | Admitting: Endocrinology

## 2013-02-09 DIAGNOSIS — H409 Unspecified glaucoma: Secondary | ICD-10-CM | POA: Diagnosis not present

## 2013-02-09 DIAGNOSIS — H4011X0 Primary open-angle glaucoma, stage unspecified: Secondary | ICD-10-CM | POA: Diagnosis not present

## 2013-02-09 DIAGNOSIS — E119 Type 2 diabetes mellitus without complications: Secondary | ICD-10-CM | POA: Diagnosis not present

## 2013-02-09 LAB — HM DIABETES EYE EXAM

## 2013-02-12 ENCOUNTER — Other Ambulatory Visit: Payer: Self-pay | Admitting: *Deleted

## 2013-02-12 MED ORDER — METOPROLOL SUCCINATE ER 25 MG PO TB24: 12.5000 mg | ORAL_TABLET | Freq: Every morning | ORAL | Status: DC

## 2013-02-12 MED ORDER — CLOPIDOGREL BISULFATE 75 MG PO TABS: 75.0000 mg | ORAL_TABLET | Freq: Every morning | ORAL | Status: DC

## 2013-02-12 MED ORDER — LEVOTHYROXINE SODIUM 137 MCG PO TABS: 137.0000 ug | ORAL_TABLET | Freq: Every day | ORAL | Status: DC

## 2013-02-21 ENCOUNTER — Other Ambulatory Visit: Payer: Self-pay | Admitting: *Deleted

## 2013-02-21 ENCOUNTER — Other Ambulatory Visit (INDEPENDENT_AMBULATORY_CARE_PROVIDER_SITE_OTHER): Payer: Medicare Other

## 2013-02-21 DIAGNOSIS — D649 Anemia, unspecified: Secondary | ICD-10-CM

## 2013-02-21 DIAGNOSIS — E119 Type 2 diabetes mellitus without complications: Secondary | ICD-10-CM

## 2013-02-21 DIAGNOSIS — E039 Hypothyroidism, unspecified: Secondary | ICD-10-CM

## 2013-02-21 LAB — HEMOGLOBIN A1C: Hgb A1c MFr Bld: 7 % — ABNORMAL HIGH (ref 4.6–6.5)

## 2013-02-21 LAB — CBC
HCT: 36.9 % (ref 36.0–46.0)
MCHC: 33.1 g/dL (ref 30.0–36.0)
MCV: 88.9 fl (ref 78.0–100.0)
Platelets: 268 10*3/uL (ref 150.0–400.0)
RDW: 13.8 % (ref 11.5–14.6)
WBC: 7.5 10*3/uL (ref 4.5–10.5)

## 2013-02-21 MED ORDER — CLOPIDOGREL BISULFATE 75 MG PO TABS: 75.0000 mg | ORAL_TABLET | Freq: Every morning | ORAL | Status: DC

## 2013-02-23 ENCOUNTER — Ambulatory Visit (INDEPENDENT_AMBULATORY_CARE_PROVIDER_SITE_OTHER): Payer: Medicare Other | Admitting: Endocrinology

## 2013-02-23 ENCOUNTER — Encounter: Payer: Self-pay | Admitting: Endocrinology

## 2013-02-23 VITALS — BP 118/68 | HR 67 | Temp 97.9°F | Resp 12 | Ht 66.0 in | Wt 185.9 lb

## 2013-02-23 DIAGNOSIS — D649 Anemia, unspecified: Secondary | ICD-10-CM

## 2013-02-23 DIAGNOSIS — E1129 Type 2 diabetes mellitus with other diabetic kidney complication: Secondary | ICD-10-CM

## 2013-02-23 DIAGNOSIS — Z23 Encounter for immunization: Secondary | ICD-10-CM | POA: Diagnosis not present

## 2013-02-23 DIAGNOSIS — E1165 Type 2 diabetes mellitus with hyperglycemia: Secondary | ICD-10-CM

## 2013-02-23 DIAGNOSIS — E785 Hyperlipidemia, unspecified: Secondary | ICD-10-CM | POA: Diagnosis not present

## 2013-02-23 DIAGNOSIS — E039 Hypothyroidism, unspecified: Secondary | ICD-10-CM

## 2013-02-23 DIAGNOSIS — E875 Hyperkalemia: Secondary | ICD-10-CM

## 2013-02-23 DIAGNOSIS — N183 Chronic kidney disease, stage 3 unspecified: Secondary | ICD-10-CM

## 2013-02-23 DIAGNOSIS — E559 Vitamin D deficiency, unspecified: Secondary | ICD-10-CM

## 2013-02-23 LAB — LIPID PANEL
Total CHOL/HDL Ratio: 5
VLDL: 58 mg/dL — ABNORMAL HIGH (ref 0.0–40.0)

## 2013-02-23 LAB — LDL CHOLESTEROL, DIRECT: Direct LDL: 103.5 mg/dL

## 2013-02-23 LAB — COMPREHENSIVE METABOLIC PANEL
ALT: 11 U/L (ref 0–35)
AST: 20 U/L (ref 0–37)
Alkaline Phosphatase: 47 U/L (ref 39–117)
Sodium: 138 mEq/L (ref 135–145)
Total Bilirubin: 0.4 mg/dL (ref 0.3–1.2)
Total Protein: 6.7 g/dL (ref 6.0–8.3)

## 2013-02-23 MED ORDER — HYDROCODONE-ACETAMINOPHEN 5-325 MG PO TABS: 1.0000 | ORAL_TABLET | Freq: Four times a day (QID) | ORAL | Status: DC | PRN

## 2013-02-23 NOTE — Progress Notes (Signed)
Patient ID: [name] [name], female   DOB: January 25, 1941, 72 y.o.   MRN: [id number]   Reason for Appointment: Diabetes follow-up   History of Present Illness   Diagnosis: Type 2 diabetes mellitus, date of diagnosis: 1986.   The patient is seen today in follow up of Type 2 diabetes which has been treated now with low dose basal bolus insulin regimen. She is not taking any oral hypoglycemic drugs for some time  Her blood sugars are well controlled with occasionally  higher after lunch and rarely after supper. Usually not checking readings on waking up The lowest previous HbgA1c recently was 6.3, on , 07/28/12.  The insulin regimen is described as Levemir 18 units hs, Novolog 6 ac twice a day and she is compliant with her regimen.   Side effects from medications: Diarrhea from metformin and nausea and vomiting from GLP-1 drugs  Monitors blood glucose: Less than once a day.  Glucometer: One Touch.  Blood Glucose readings:   PREMEAL Breakfast Lunch Dinner Bedtime Overall  Glucose range:  126-147       Mean/median:      149   POST-MEAL PC Breakfast PC Lunch PC Dinner  Glucose range:   148-165   125-175  Mean/median:   158   147   Hypoglycemia frequency: None recently.   Meals: she is trying to consume low fat meals; meals 11.30 AM,and Light supper at 5 pm.  Calorie intake: small supper, balanced lunch which is her main meal.  Physical activity: exercise: a little on exercise bike or walking (legs weak)  Dietician visit: Most recent:, 5/13.   Wt Readings from Last 3 Encounters:  02/23/13 185 lb 14.4 oz (84.324 kg)  01/10/13 185 lb 3.2 oz (84.006 kg)  01/03/13 185 lb (83.915 kg)    Lab Results  Component Value Date   HGBA1C 7.0* 02/21/2013   HGBA1C 6.6* 01/04/2013   HGBA1C 6.4 11/08/2012   Lab Results  Component Value Date   MICROALBUR 46.80* 01/04/2013   LDLCALC  Value: 39        Total Cholesterol/HDL:CHD Risk Coronary Heart Disease Risk Table                     Men   Women  1/2  Average Risk   3.4   3.3  Average Risk       5.0   4.4  2 X Average Risk   9.6   7.1  3 X Average Risk  23.4   11.0        Use the calculated Patient Ratio above and the CHD Risk Table to determine the patient's CHD Risk.        ATP III CLASSIFICATION (LDL):  <100     mg/dL   Optimal  100-129  mg/dL   Near or Above                    Optimal  130-159  mg/dL   Borderline  160-189  mg/dL   High  >190     mg/dL   Very High 01/12/2010   CREATININE 1.5* 02/23/2013     2. CKD: Her last creatinine was 1.5, and it has been fluctuating.    3. HYPERKALEMIA: Because of persistent mild increase in potassium she was started on Florinef 3 times a week and she is having no edema with this. Potassium is normal now  4. HYPOTHYROIDISM: She has had long-standing primary hypothyroidism and this is adequately  replaced  Lab Results  Component Value Date   TSH 1.522 01/04/2013   5. Vitamin D deficiency: She has been on supplements for quite some diabetes asking for replacement for a prescription since it will not be covered by insurance     Medication List       This list is accurate as of: 02/23/13 11:59 PM.  Always use your most recent med list.               albuterol 108 (90 BASE) MCG/ACT inhaler  Commonly known as:  PROVENTIL HFA;VENTOLIN HFA  Inhale 2 puffs into the lungs every 6 (six) hours as needed for wheezing.     aliskiren 150 MG tablet  Commonly known as:  TEKTURNA  Take 1 tablet (150 mg total) by mouth daily.     aspirin 81 MG chewable tablet  Chew 81 mg by mouth at bedtime.     BD PEN NEEDLE NANO U/F 32G X 4 MM Misc  Generic drug:  Insulin Pen Needle     calcium-vitamin D 500-200 MG-UNIT per tablet  Commonly known as:  OSCAL WITH D  Take 1 tablet by mouth every morning.     Choline Fenofibrate 135 MG capsule  Commonly known as:  TRILIPIX  Take 1 capsule (135 mg total) by mouth at bedtime.     cilostazol 100 MG tablet  Commonly known as:  PLETAL  Take 100 mg by mouth 2 (two)  times daily.     clopidogrel 75 MG tablet  Commonly known as:  PLAVIX  Take 1 tablet (75 mg total) by mouth every morning.     docusate sodium 100 MG capsule  Commonly known as:  COLACE  Take 100 mg by mouth 2 (two) times daily as needed for constipation.     ferrous sulfate 325 (65 FE) MG tablet  Take 325 mg by mouth at bedtime.     fludrocortisone 0.1 MG tablet  Commonly known as:  FLORINEF  Take 1 tablet (0.1 mg total) by mouth daily.     glucose blood test strip  TEST AS DIRECTED 3 TIMES DAILY     HYDROcodone-acetaminophen 5-325 MG per tablet  Commonly known as:  NORCO/VICODIN  Take 1 tablet by mouth every 6 (six) hours as needed.     insulin aspart 100 UNIT/ML Sopn FlexPen  Commonly known as:  novoLOG  Inject 6 Units into the skin 3 (three) times daily with meals.     Insulin Detemir 100 UNIT/ML Sopn  Commonly known as:  LEVEMIR FLEXTOUCH  INJECT 18 UNITS INTO THE SKIN AT BEDTIME     levothyroxine 137 MCG tablet  Commonly known as:  SYNTHROID, LEVOTHROID  Take 1 tablet (137 mcg total) by mouth daily before breakfast.     magnesium oxide 400 MG tablet  Commonly known as:  MAG-OX  Take 800 mg by mouth at bedtime.     metoprolol succinate 25 MG 24 hr tablet  Commonly known as:  TOPROL-XL  Take 0.5 tablets (12.5 mg total) by mouth every morning.     omeprazole 20 MG capsule  Commonly known as:  PRILOSEC  Take 1 capsule (20 mg total) by mouth daily.     oxybutynin 5 MG tablet  Commonly known as:  DITROPAN  Take 5 mg by mouth at bedtime.     rosuvastatin 40 MG tablet  Commonly known as:  CRESTOR  Take 1 tablet daily     vitamin B-12 1000 MCG tablet  Commonly  known as:  CYANOCOBALAMIN  Take 1,000 mcg by mouth every morning.     vitamin C 500 MG tablet  Commonly known as:  ASCORBIC ACID  Take 500 mg by mouth every morning.     Vitamin D (Ergocalciferol) 50000 UNITS Caps capsule  Commonly known as:  DRISDOL  Take 1 capsule (50,000 Units total) by mouth  every 7 (seven) days. On thursdays        Allergies: No Known Allergies  Past Medical History  Diagnosis Date  . Diabetes mellitus without complication     type 2  . CHF (congestive heart failure)   . Coronary artery disease   . AAA (abdominal aortic aneurysm) 03/09/2012  . PVD (peripheral vascular disease)   . Carotid artery stenosis     carotid doppler 06/2012 - Right CCA/Bulb/ICA with chronic occlusion; L vertebral artery with abnormal blood flow; L Bulb/Prox ICA  s/p endarterectomy with mild fibrous plaque, 50% diameter reduction  . Hypertension   . Hyperlipidemia   . Hypothyroidism   . History of nuclear stress test 11/24/2011    lexiscan; nonischemic, low risk   . PAD (peripheral artery disease)     09/2010 LEAs - R ABI of 0.45, occluded fem-pop bypass graft, L ABI of 0.59 with occluded SFA; severe arterial insuff  . Ischemic cardiomyopathy     EF 03/2010 50-55%    Past Surgical History  Procedure Laterality Date  . Coronary artery bypass graft  1997    x6; internal mammary to LAD, SVG to ramus #1 & #2, SVG to OM, SVG to PDA,   . Abdominal hysterectomy  1986  . Tumor removal    . Coronary stent placement    . Femoral artery repair    . Cystoscopy w/ ureteral stent placement  03/10/2012    Procedure: CYSTOSCOPY WITH RETROGRADE PYELOGRAM/URETERAL STENT PLACEMENT;  Surgeon: Hanley Ben, MD;  Location: [facility];  Service: Urology;  Laterality: Left;  . Carotid endarterectomy Bilateral   . Aortobifemoral bypass grafting  1997    with R fem-pop  . Cardiac catheterization  12/2009    occluded vein to OM & ramus branches, patent vein to PDA, patent LIMA to LAD (Dr. [name], [facility]) - later had thrombectomy of R fem-pop bypass ad R common femoral & profunda femoris artery (Dr. Oneida Alar)  . Transthoracic echocardiogram  03/25/2010    EF 99991111, LV systolic function low normal with mild inferoseptal hypocontractility; LA mildly dilated; mod MR; mild TR, RV systolic pressure elevated, mild  pulm HTN; AV mildly sclerotic; mild pulm valve regurg; aortic root sclerosis/calcif     Family History  Problem Relation Age of Onset  . Congestive Heart Failure Mother   . Diabetes Mother   . Stroke Father     Social History:  reports that she quit smoking about 3 years ago. She has never used smokeless tobacco. She reports that she does not drink alcohol or use illicit drugs.  Review of Systems - Cardiovascular ROS: positive for - CAD  Hyperlipidemia: Has history of high LDL and triglycerides   sleep well all and he is in and a [place] a there is an eye and I will and will and a  She is compliant with her current doses of 20 mg Crestor and Trilipix No difficulties with liver dysfunction recently  Lab Results  Component Value Date   CHOL 163 02/23/2013   HDL 31.60* 02/23/2013   LDLCALC  Value: 39        Total Cholesterol/HDL:CHD  Risk Coronary Heart Disease Risk Table                     Men   Women  1/2 Average Risk   3.4   3.3  Average Risk       5.0   4.4  2 X Average Risk   9.6   7.1  3 X Average Risk  23.4   11.0        Use the calculated Patient Ratio above and the CHD Risk Table to determine the patient's CHD Risk.        ATP III CLASSIFICATION (LDL):  <100     mg/dL   Optimal  100-129  mg/dL   Near or Above                    Optimal  130-159  mg/dL   Borderline  160-189  mg/dL   High  >190     mg/dL   Very High 01/12/2010   LDLDIRECT 103.5 02/23/2013   TRIG 290.0* 02/23/2013   CHOLHDL 5 02/23/2013    Iron Deficiency:  Has had recurrent anemia with  persistently low hemoglobinand also iron deficiency. Most recent hemoglobin was back to normal at  12.2 Previous history: In 8/13 she had low iron saturation and was 19 in 2/14. Also taking B12. With iron infusion her hemoglobin was back up to 11.3.  Hemoccult was negative in 9/13.      LABS:  Office Visit on 02/23/2013  Component Date Value Range Status  . Sodium 02/23/2013 138  135 - 145 mEq/L Final  . Potassium 02/23/2013  4.7  3.5 - 5.1 mEq/L Final  . Chloride 02/23/2013 103  96 - 112 mEq/L Final  . CO2 02/23/2013 26  19 - 32 mEq/L Final  . Glucose, Bld 02/23/2013 143* 70 - 99 mg/dL Final  . BUN 02/23/2013 28* 6 - 23 mg/dL Final  . Creatinine, Ser 02/23/2013 1.5* 0.4 - 1.2 mg/dL Final  . Total Bilirubin 02/23/2013 0.4  0.3 - 1.2 mg/dL Final  . Alkaline Phosphatase 02/23/2013 47  39 - 117 U/L Final  . AST 02/23/2013 20  0 - 37 U/L Final  . ALT 02/23/2013 11  0 - 35 U/L Final  . Total Protein 02/23/2013 6.7  6.0 - 8.3 g/dL Final  . Albumin 02/23/2013 3.4* 3.5 - 5.2 g/dL Final  . Calcium 02/23/2013 9.3  8.4 - 10.5 mg/dL Final  . GFR 02/23/2013 37.15* >60.00 mL/min Final  . Cholesterol 02/23/2013 163  0 - 200 mg/dL Final   ATP III Classification       Desirable:  < 200 mg/dL               Borderline High:  200 - 239 mg/dL          High:  > = 240 mg/dL  . Triglycerides 02/23/2013 290.0* 0.0 - 149.0 mg/dL Final   Normal:  <150 mg/dLBorderline High:  150 - 199 mg/dL  . HDL 02/23/2013 31.60* >39.00 mg/dL Final  . VLDL 02/23/2013 58.0* 0.0 - 40.0 mg/dL Final  . Total CHOL/HDL Ratio 02/23/2013 5   Final                  Men          Women1/2 Average Risk     3.4          3.3Average Risk          5.0  4.42X Average Risk          9.6          7.13X Average Risk          15.0          11.0                      . Direct LDL 02/23/2013 103.5   Final   Optimal:  <100 mg/dLNear or Above Optimal:  100-129 mg/dLBorderline High:  130-159 mg/dLHigh:  160-189 mg/dLVery High:  >190 mg/dL  Appointment on 02/21/2013  Component Date Value Range Status  . Hemoglobin A1C 02/21/2013 7.0* 4.6 - 6.5 % Final   Glycemic Control Guidelines for People with Diabetes:Non Diabetic:  <6%Goal of Therapy: <7%Additional Action Suggested:  >8%   . WBC 02/21/2013 7.5  4.5 - 10.5 K/uL Final  . RBC 02/21/2013 4.15  3.87 - 5.11 Mil/uL Final  . Platelets 02/21/2013 268.0  150.0 - 400.0 K/uL Final  . Hemoglobin 02/21/2013 12.2  12.0 - 15.0  g/dL Final  . HCT 02/21/2013 36.9  36.0 - 46.0 % Final  . MCV 02/21/2013 88.9  78.0 - 100.0 fl Final  . MCHC 02/21/2013 33.1  30.0 - 36.0 g/dL Final  . RDW 02/21/2013 13.8  11.5 - 14.6 % Final      Examination:   BP 118/68  Pulse 67  Temp(Src) 97.9 F (36.6 C)  Resp 12  Ht 5\' 6"  (1.676 m)  Wt 185 lb 14.4 oz (84.324 kg)  BMI 30.02 kg/m2  SpO2 90%  Body mass index is 30.02 kg/(m^2).   No leg edema present  Assesment/PLAN:   1. DIABETES:  the current A1c is  relatively higher although blood sugars are not significantly high recently. Generally having high readings after her lunch compared suppertime and fasting readings are reasonably good 2.  Will increase her NovoLog to 7 units at lunch units and keep her suppertime coverage unchanged. Since her fasting readings are fairly good will continue the same dose of Levemir. Also encouraged her to increase exercising on exercise bike daily which she is doing only occasionally. She does need to lose weight and this has leveled off now 3. 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 She is doing  better with taking Florinef 3 times a week and no side effects of edema. Will continue the same dose 4. CKD: Her creatinine is variable but still high. Mild increase is likely to be from starting Tekturna 5. Hyperlipidemia:  her LDL is still slightly over 100 despite increasing her Crestor to 40 mg; her triglycerides are high and she needs to lose weight. 6. History of anemia: Normal and she can reduce her iron to every other day 7. HYPOTHYROIDISM: Her TSH is consistently good on the current dose 8. Frequent UTI. No recent recurrence 9. Microalbuminuria: She has had significant increase in urine microalbumin and was started on Tekturna low dose. Blood pressure is improved. 10. Use Vitamin D3, 5000 units daily instead of prescription which she cannot afford

## 2013-02-23 NOTE — Patient Instructions (Addendum)
Vitamin D3, 5000 units daily Tekturna 1/2 daily  Novolog 7 at lunch and 6 at supper  Walk or bike daily  Iron every 2 days

## 2013-02-26 ENCOUNTER — Other Ambulatory Visit: Payer: Self-pay | Admitting: *Deleted

## 2013-02-26 MED ORDER — MAGNESIUM OXIDE 400 MG PO TABS: 800.0000 mg | ORAL_TABLET | Freq: Every day | ORAL | Status: DC

## 2013-02-26 MED ORDER — ALISKIREN FUMARATE 150 MG PO TABS: 150.0000 mg | ORAL_TABLET | Freq: Every day | ORAL | Status: DC

## 2013-02-26 MED ORDER — FLUDROCORTISONE ACETATE 0.1 MG PO TABS: ORAL_TABLET | ORAL | Status: DC

## 2013-02-26 MED ORDER — INSULIN PEN NEEDLE 32G X 4 MM MISC: Status: DC

## 2013-02-27 ENCOUNTER — Other Ambulatory Visit: Payer: Self-pay | Admitting: *Deleted

## 2013-02-27 DIAGNOSIS — Z23 Encounter for immunization: Secondary | ICD-10-CM

## 2013-02-27 MED ORDER — INSULIN DETEMIR 100 UNIT/ML FLEXPEN: PEN_INJECTOR | SUBCUTANEOUS | Status: DC

## 2013-02-27 MED ORDER — INSULIN ASPART 100 UNIT/ML FLEXPEN: 6.0000 [IU] | PEN_INJECTOR | Freq: Three times a day (TID) | SUBCUTANEOUS | Status: DC

## 2013-03-02 ENCOUNTER — Other Ambulatory Visit: Payer: Self-pay | Admitting: *Deleted

## 2013-03-19 ENCOUNTER — Telehealth: Payer: Self-pay | Admitting: *Deleted

## 2013-03-19 ENCOUNTER — Other Ambulatory Visit: Payer: Self-pay | Admitting: *Deleted

## 2013-03-19 MED ORDER — CIPROFLOXACIN HCL 500 MG PO TABS: 500.0000 mg | ORAL_TABLET | Freq: Two times a day (BID) | ORAL | Status: DC

## 2013-03-19 NOTE — Telephone Encounter (Signed)
Yes, what symptoms?

## 2013-03-19 NOTE — Telephone Encounter (Signed)
She said it was like the "old tummy feeling, she said she feels a lot of pressure, she said she strains a lot and the pressure hurts.

## 2013-03-19 NOTE — Telephone Encounter (Signed)
Patient called, she says she has another regular kidney infection, she wants to bring a urine specimen tomorrow morning if that's okay?

## 2013-03-19 NOTE — Telephone Encounter (Signed)
Noted, rx sent.  Patient is aware 

## 2013-03-19 NOTE — Telephone Encounter (Signed)
Cipro 500mg  bid 10 tabs, need u/a

## 2013-03-20 ENCOUNTER — Other Ambulatory Visit (INDEPENDENT_AMBULATORY_CARE_PROVIDER_SITE_OTHER): Payer: Medicare Other

## 2013-03-20 ENCOUNTER — Other Ambulatory Visit: Payer: Self-pay | Admitting: *Deleted

## 2013-03-20 DIAGNOSIS — N390 Urinary tract infection, site not specified: Secondary | ICD-10-CM

## 2013-03-20 LAB — URINALYSIS, ROUTINE W REFLEX MICROSCOPIC
Bilirubin Urine: NEGATIVE
Ketones, ur: NEGATIVE
NITRITE: NEGATIVE
Specific Gravity, Urine: 1.025 (ref 1.000–1.030)
Total Protein, Urine: 100 — AB
Urine Glucose: NEGATIVE
Urobilinogen, UA: 0.2 (ref 0.0–1.0)
pH: 6 (ref 5.0–8.0)

## 2013-03-20 NOTE — Progress Notes (Signed)
Quick Note:  She does have infection, continue Cipro ______

## 2013-03-22 ENCOUNTER — Other Ambulatory Visit: Payer: Self-pay | Admitting: *Deleted

## 2013-03-22 MED ORDER — ALISKIREN FUMARATE 150 MG PO TABS: 150.0000 mg | ORAL_TABLET | Freq: Every day | ORAL | Status: DC

## 2013-03-28 ENCOUNTER — Other Ambulatory Visit: Payer: Self-pay | Admitting: *Deleted

## 2013-03-28 MED ORDER — FLUDROCORTISONE ACETATE 0.1 MG PO TABS: ORAL_TABLET | ORAL | Status: DC

## 2013-04-06 ENCOUNTER — Ambulatory Visit
Admission: RE | Admit: 2013-04-06 | Discharge: 2013-04-06 | Disposition: A | Discharge status: Home / Self Care | Payer: Medicare Other | Source: Ambulatory Visit | Attending: Endocrinology | Admitting: Endocrinology

## 2013-04-06 ENCOUNTER — Ambulatory Visit (INDEPENDENT_AMBULATORY_CARE_PROVIDER_SITE_OTHER): Payer: Medicare Other | Admitting: Endocrinology

## 2013-04-06 ENCOUNTER — Encounter: Payer: Self-pay | Admitting: Endocrinology

## 2013-04-06 VITALS — BP 152/68 | HR 87 | Temp 98.2°F | Resp 18 | Ht 66.0 in | Wt 187.2 lb

## 2013-04-06 DIAGNOSIS — R0609 Other forms of dyspnea: Secondary | ICD-10-CM

## 2013-04-06 DIAGNOSIS — J811 Chronic pulmonary edema: Secondary | ICD-10-CM | POA: Diagnosis not present

## 2013-04-06 DIAGNOSIS — R0600 Dyspnea, unspecified: Secondary | ICD-10-CM

## 2013-04-06 DIAGNOSIS — N183 Chronic kidney disease, stage 3 unspecified: Secondary | ICD-10-CM

## 2013-04-06 DIAGNOSIS — R0989 Other specified symptoms and signs involving the circulatory and respiratory systems: Secondary | ICD-10-CM

## 2013-04-06 NOTE — Progress Notes (Signed)
Subjective: Shortness of breath      Patient ID: [name] [name], female   DOB: May 23, 1940, 73 y.o.   MRN: [id number]  HPI  Patient is off of the last week or so she has been getting out of breath more easily. She also gets short of breath on activity She thinks her breathing may be a little worse when she is lying down. Does not think she has any cough but she has difficulty getting enough air. No wheezing, fever, chest discomfort She took some albuterol and she thinks she feels a little better with this. Previously also has had episodes of mild shortness of breath  Review of Systems  Cardiovascular: Negative for chest pain and leg swelling.       Objective:   Physical Exam  Vitals reviewed. Constitutional: She appears well-developed and well-nourished.  Neck: No JVD present.  Cardiovascular: Regular rhythm and normal heart sounds.   Pulmonary/Chest: Effort normal. No respiratory distress. She has no wheezes. She has rales.  She has a few basal crepitations, right more than the left. No dullness to percussion  Abdominal: She exhibits no distension.  Musculoskeletal: She exhibits no edema.   BP 152/68  Pulse 87  Temp(Src) 98.2 F (36.8 C)  Resp 18  Ht 5\' 6"  (1.676 m)  Wt 187 lb 3.2 oz (84.913 kg)  BMI 30.23 kg/m2  SpO2 88%     Assessment:     Dyspnea without evidence of bronchospasm No history suggestive of infectious process May have mild CHF based on her exam    Plan:     Chest x-ray today     Addendum: Chest x-ray shows some vascular congestion and peribronchial cuffing, left pleural blunting unchanged Most likely she may have mild CHF. She will stop her Florinef and start Lasix, take 40 mg for 2 days and then 20 mg daily She will go to ER if she has any worsening of dyspnea  Raelie Lohr

## 2013-04-10 ENCOUNTER — Other Ambulatory Visit: Payer: Self-pay | Admitting: *Deleted

## 2013-04-10 MED ORDER — FUROSEMIDE 20 MG PO TABS: ORAL_TABLET | ORAL | Status: DC

## 2013-04-24 ENCOUNTER — Other Ambulatory Visit: Payer: Medicare Other

## 2013-04-27 ENCOUNTER — Ambulatory Visit: Payer: Medicare Other | Admitting: Endocrinology

## 2013-05-02 ENCOUNTER — Other Ambulatory Visit (INDEPENDENT_AMBULATORY_CARE_PROVIDER_SITE_OTHER): Payer: Medicare Other

## 2013-05-02 DIAGNOSIS — E785 Hyperlipidemia, unspecified: Secondary | ICD-10-CM | POA: Diagnosis not present

## 2013-05-02 DIAGNOSIS — E559 Vitamin D deficiency, unspecified: Secondary | ICD-10-CM | POA: Diagnosis not present

## 2013-05-02 DIAGNOSIS — E875 Hyperkalemia: Secondary | ICD-10-CM | POA: Diagnosis not present

## 2013-05-02 LAB — LDL CHOLESTEROL, DIRECT: Direct LDL: 96.2 mg/dL

## 2013-05-02 LAB — TSH: TSH: 0.64 u[IU]/mL (ref 0.35–5.50)

## 2013-05-02 LAB — BASIC METABOLIC PANEL
BUN: 35 mg/dL — AB (ref 6–23)
CALCIUM: 9.4 mg/dL (ref 8.4–10.5)
CO2: 27 mEq/L (ref 19–32)
CREATININE: 1.5 mg/dL — AB (ref 0.4–1.2)
Chloride: 108 mEq/L (ref 96–112)
GFR: 36.55 mL/min — ABNORMAL LOW (ref >60.00)
GLUCOSE: 136 mg/dL — AB (ref 70–99)
Potassium: 5 mEq/L (ref 3.5–5.1)
Sodium: 140 mEq/L (ref 135–145)

## 2013-05-03 LAB — VITAMIN D 25 HYDROXY (VIT D DEFICIENCY, FRACTURES): VIT D 25 HYDROXY: 48 ng/mL (ref 30–89)

## 2013-05-07 ENCOUNTER — Ambulatory Visit (INDEPENDENT_AMBULATORY_CARE_PROVIDER_SITE_OTHER): Payer: Medicare Other | Admitting: Endocrinology

## 2013-05-07 ENCOUNTER — Encounter: Payer: Self-pay | Admitting: Endocrinology

## 2013-05-07 VITALS — BP 130/72 | HR 65 | Temp 98.2°F | Resp 16 | Ht 66.0 in | Wt 183.8 lb

## 2013-05-07 DIAGNOSIS — E039 Hypothyroidism, unspecified: Secondary | ICD-10-CM

## 2013-05-07 DIAGNOSIS — E1129 Type 2 diabetes mellitus with other diabetic kidney complication: Secondary | ICD-10-CM | POA: Diagnosis not present

## 2013-05-07 DIAGNOSIS — N183 Chronic kidney disease, stage 3 unspecified: Secondary | ICD-10-CM

## 2013-05-07 DIAGNOSIS — E785 Hyperlipidemia, unspecified: Secondary | ICD-10-CM | POA: Diagnosis not present

## 2013-05-07 DIAGNOSIS — E875 Hyperkalemia: Secondary | ICD-10-CM

## 2013-05-07 DIAGNOSIS — E1165 Type 2 diabetes mellitus with hyperglycemia: Principal | ICD-10-CM

## 2013-05-07 MED ORDER — HYDROCODONE-ACETAMINOPHEN 5-325 MG PO TABS: 1.0000 | ORAL_TABLET | Freq: Three times a day (TID) | ORAL | Status: DC | PRN

## 2013-05-07 NOTE — Progress Notes (Signed)
Patient ID: [name] [name], female   DOB: 1940-06-11, 73 y.o.   MRN: [id number]   Reason for Appointment: Diabetes follow-up   History of Present Illness   Diagnosis: Type 2 diabetes mellitus, date of diagnosis: 1986.   The patient is seen today in follow up of long-standing Type 2 diabetes which has been treated more recently with low dose basal bolus insulin regimen. She has not been taking  any oral hypoglycemic drugs for some time The lowest previous HbgA1c  was 6.3, on 07/28/12.   Her blood sugars are well controlled with occasional high postprandial readings but also periodic high readings before her first meal She thinks blood sugar may be higher in the morning if she is having snack after supper, has good readings the last 3 times she has checked them in the morning. Not checking glucose much after meals  A1c appears to be gradually increasing although still relatively good at 7%  The insulin regimen is described as Levemir 18 units hs, Novolog 6 ac twice a day and she is compliant with her regimen.   Side effects from medications: Diarrhea from metformin and nausea and vomiting from GLP-1 drugs  Monitors blood glucose: 1.1 times a day  Glucometer: One Touch.  Blood Glucose readings:   PREMEAL Breakfast Lunch Dinner Bedtime Overall  Glucose range: 103-171   110-137  174   Mean/median:  142     141   Hypoglycemia frequency: None recently.   Meals: she is usually eating low fat meals but is frequently eating out at lunch; meals 11 AM,and Light supper at 5 pm.  Calorie intake: small supper, balanced lunch which is her main meal.  Physical activity: exercise: a little on exercise bike 2/7  Dietician visit: Most recent:, 5/13.   Wt Readings from Last 3 Encounters:  05/07/13 183 lb 12.8 oz (83.371 kg)  04/06/13 187 lb 3.2 oz (84.913 kg)  02/23/13 185 lb 14.4 oz (84.324 kg)    Lab Results  Component Value Date   HGBA1C 7.0* 02/21/2013   HGBA1C 6.6* 01/04/2013   HGBA1C 6.4  11/08/2012   Lab Results  Component Value Date   MICROALBUR 46.80* 01/04/2013   LDLCALC  Value: 39        Total Cholesterol/HDL:CHD Risk Coronary Heart Disease Risk Table                     Men   Women  1/2 Average Risk   3.4   3.3  Average Risk       5.0   4.4  2 X Average Risk   9.6   7.1  3 X Average Risk  23.4   11.0        Use the calculated Patient Ratio above and the CHD Risk Table to determine the patient's CHD Risk.        ATP III CLASSIFICATION (LDL):  <100     mg/dL   Optimal  100-129  mg/dL   Near or Above                    Optimal  130-159  mg/dL   Borderline  160-189  mg/dL   High  >190     mg/dL   Very High 01/12/2010   CREATININE 1.5* 05/02/2013     2. Dyspnea: She was seen on 04/06/13 for dyspnea. Chest x-ray showed only mild vascular congestion and she was improved with stopping Florinef and Lasix for about 2  weeks. Off Lasix 2 weeks and has not had any dyspnea. Also weight has come down.   3. HYPERKALEMIA: Because of persistent mild increase in potassium she had been on Florinef 3 times a week. This was discontinued because of possible CHF as above. Potassium is upper normal now. She is usually not eating fruits and other high potassium foods CKD: Her creatinine is stable at 1.5 despite taking low dose Tekturna for nephropathy  4. HYPOTHYROIDISM: She has had long-standing primary hypothyroidism and this is adequately replaced, no unusual fatigue  Lab Results  Component Value Date   TSH 0.64 05/02/2013        Medication List       This list is accurate as of: 05/07/13  1:32 PM.  Always use your most recent med list.               albuterol 108 (90 BASE) MCG/ACT inhaler  Commonly known as:  PROVENTIL HFA;VENTOLIN HFA  Inhale 2 puffs into the lungs every 6 (six) hours as needed for wheezing.     aliskiren 150 MG tablet  Commonly known as:  TEKTURNA  Take 1 tablet (150 mg total) by mouth daily.     aspirin 81 MG chewable tablet  Chew 81 mg by mouth at bedtime.      calcium-vitamin D 500-200 MG-UNIT per tablet  Commonly known as:  OSCAL WITH D  Take 1 tablet by mouth every morning.     Choline Fenofibrate 135 MG capsule  Commonly known as:  TRILIPIX  Take 1 capsule (135 mg total) by mouth at bedtime.     cilostazol 100 MG tablet  Commonly known as:  PLETAL  Take 100 mg by mouth 2 (two) times daily.     clopidogrel 75 MG tablet  Commonly known as:  PLAVIX  Take 1 tablet (75 mg total) by mouth every morning.     docusate sodium 100 MG capsule  Commonly known as:  COLACE  Take 100 mg by mouth 2 (two) times daily as needed for constipation.     ferrous sulfate 325 (65 FE) MG tablet  Take 325 mg by mouth at bedtime.     fludrocortisone 0.1 MG tablet  Commonly known as:  FLORINEF  Take 1 tablet by mouth 3 times per week     furosemide 20 MG tablet  Commonly known as:  LASIX  Take 2 tablets the first day, then one tablet daily     glucose blood test strip  TEST AS DIRECTED 3 TIMES DAILY     HYDROcodone-acetaminophen 5-325 MG per tablet  Commonly known as:  NORCO/VICODIN  Take 1 tablet by mouth every 6 (six) hours as needed.     insulin aspart 100 UNIT/ML FlexPen  Commonly known as:  NOVOLOG  Inject 7 Units into the skin 3 (three) times daily with meals. 7 units at breakfast and 6 units at lunch and dinner     Insulin Detemir 100 UNIT/ML Pen  Commonly known as:  LEVEMIR FLEXTOUCH  Inject 18 units at bedtime     Insulin Pen Needle 32G X 4 MM Misc  Commonly known as:  BD PEN NEEDLE NANO U/F  Use 3 needles per day     levothyroxine 137 MCG tablet  Commonly known as:  SYNTHROID, LEVOTHROID  Take 1 tablet (137 mcg total) by mouth daily before breakfast.     magnesium oxide 400 MG tablet  Commonly known as:  MAG-OX  Take 2 tablets (800 mg total)  by mouth at bedtime.     metoprolol succinate 25 MG 24 hr tablet  Commonly known as:  TOPROL-XL  Take 0.5 tablets (12.5 mg total) by mouth every morning.     omeprazole 20 MG capsule   Commonly known as:  PRILOSEC  Take 1 capsule (20 mg total) by mouth daily.     oxybutynin 5 MG tablet  Commonly known as:  DITROPAN  Take 5 mg by mouth at bedtime.     rosuvastatin 40 MG tablet  Commonly known as:  CRESTOR  Take 1 tablet daily     vitamin B-12 1000 MCG tablet  Commonly known as:  CYANOCOBALAMIN  Take 1,000 mcg by mouth every morning.     vitamin C 500 MG tablet  Commonly known as:  ASCORBIC ACID  Take 500 mg by mouth every morning.     Vitamin D (Ergocalciferol) 50000 UNITS Caps capsule  Commonly known as:  DRISDOL  Take 1 capsule (50,000 Units total) by mouth every 7 (seven) days. On thursdays        Allergies: No Known Allergies  Past Medical History  Diagnosis Date  . Diabetes mellitus without complication     type 2  . CHF (congestive heart failure)   . Coronary artery disease   . AAA (abdominal aortic aneurysm) 03/09/2012  . PVD (peripheral vascular disease)   . Carotid artery stenosis     carotid doppler 06/2012 - Right CCA/Bulb/ICA with chronic occlusion; L vertebral artery with abnormal blood flow; L Bulb/Prox ICA  s/p endarterectomy with mild fibrous plaque, 50% diameter reduction  . Hypertension   . Hyperlipidemia   . Hypothyroidism   . History of nuclear stress test 11/24/2011    lexiscan; nonischemic, low risk   . PAD (peripheral artery disease)     09/2010 LEAs - R ABI of 0.45, occluded fem-pop bypass graft, L ABI of 0.59 with occluded SFA; severe arterial insuff  . Ischemic cardiomyopathy     EF 03/2010 50-55%    Past Surgical History  Procedure Laterality Date  . Coronary artery bypass graft  1997    x6; internal mammary to LAD, SVG to ramus #1 & #2, SVG to OM, SVG to PDA,   . Abdominal hysterectomy  1986  . Tumor removal    . Coronary stent placement    . Femoral artery repair    . Cystoscopy w/ ureteral stent placement  03/10/2012    Procedure: CYSTOSCOPY WITH RETROGRADE PYELOGRAM/URETERAL STENT PLACEMENT;  Surgeon: Hanley Ben, MD;  Location: [facility];  Service: Urology;  Laterality: Left;  . Carotid endarterectomy Bilateral   . Aortobifemoral bypass grafting  1997    with R fem-pop  . Cardiac catheterization  12/2009    occluded vein to OM & ramus branches, patent vein to PDA, patent LIMA to LAD (Dr. [name], [facility]) - later had thrombectomy of R fem-pop bypass ad R common femoral & profunda femoris artery (Dr. Oneida Alar)  . Transthoracic echocardiogram  03/25/2010    EF 99991111, LV systolic function low normal with mild inferoseptal hypocontractility; LA mildly dilated; mod MR; mild TR, RV systolic pressure elevated, mild pulm HTN; AV mildly sclerotic; mild pulm valve regurg; aortic root sclerosis/calcif     Family History  Problem Relation Age of Onset  . Congestive Heart Failure Mother   . Diabetes Mother   . Stroke Father     Social History:  reports that she quit smoking about 3 years ago. She has never used smokeless tobacco.  She reports that she does not drink alcohol or use illicit drugs.  Review of Systems - Cardiovascular ROS: positive for - CAD  Hyperlipidemia: Has history of high LDL and triglycerides/low HDL  She is compliant with her current doses of 20 mg Crestor and Trilipix, LDL better now No issues with liver dysfunction recently  Lab Results  Component Value Date   CHOL 163 02/23/2013   HDL 31.60* 02/23/2013   LDLCALC  Value: 39        Total Cholesterol/HDL:CHD Risk Coronary Heart Disease Risk Table                     Men   Women  1/2 Average Risk   3.4   3.3  Average Risk       5.0   4.4  2 X Average Risk   9.6   7.1  3 X Average Risk  23.4   11.0        Use the calculated Patient Ratio above and the CHD Risk Table to determine the patient's CHD Risk.        ATP III CLASSIFICATION (LDL):  <100     mg/dL   Optimal  100-129  mg/dL   Near or Above                    Optimal  130-159  mg/dL   Borderline  160-189  mg/dL   High  >190     mg/dL   Very High 01/12/2010   LDLDIRECT 96.2 05/02/2013    TRIG 290.0* 02/23/2013   CHOLHDL 5 02/23/2013    Iron Deficiency anemia:  Has had  anemia with  persistently low hemoglobin and also iron deficiency. Most recent hemoglobin was back to normal   Previous history: In 8/13 she had low iron saturation and was 19 in 2/14. Also taking B12.  With iron infusion her hemoglobin was back up to 11.3.  Hemoccult was negative in 9/13.   Lab Results  Component Value Date   WBC 7.5 02/21/2013   HGB 12.2 02/21/2013   HCT 36.9 02/21/2013   MCV 88.9 02/21/2013   PLT 268.0 02/21/2013    Vitamin D deficiency: She has been on supplements for quite some time  Recurrent UTI: Had last infection in 1/15, currently asymptomatic   LABS:  Appointment on 05/02/2013  Component Date Value Ref Range Status  . Sodium 05/02/2013 140  135 - 145 mEq/L Final  . Potassium 05/02/2013 5.0  3.5 - 5.1 mEq/L Final  . Chloride 05/02/2013 108  96 - 112 mEq/L Final  . CO2 05/02/2013 27  19 - 32 mEq/L Final  . Glucose, Bld 05/02/2013 136* 70 - 99 mg/dL Final  . BUN 05/02/2013 35* 6 - 23 mg/dL Final  . Creatinine, Ser 05/02/2013 1.5* 0.4 - 1.2 mg/dL Final  . Calcium 05/02/2013 9.4  8.4 - 10.5 mg/dL Final  . GFR 05/02/2013 36.55* >60.00 mL/min Final  . TSH 05/02/2013 0.64  0.35 - 5.50 uIU/mL Final  . Vit D, 25-Hydroxy 05/02/2013 48  30 - 89 ng/mL Final   Comment: This assay accurately quantifies Vitamin D, which is the sum of the                          25-Hydroxy forms of Vitamin D2 and D3.  Studies have shown that the  optimum concentration of 25-Hydroxy Vitamin D is 30 ng/mL or higher.                           Concentrations of Vitamin D between 20 and 29 ng/mL are considered to                          be insufficient and concentrations less than 20 ng/mL are considered                          to be deficient for Vitamin D.  . Direct LDL 05/02/2013 96.2   Final   Optimal:  <100 mg/dLNear or Above Optimal:  100-129 mg/dLBorderline High:   130-159 mg/dLHigh:  160-189 mg/dLVery High:  >190 mg/dL      Examination:   BP 130/72  Pulse 65  Temp(Src) 98.2 F (36.8 C)  Resp 16  Ht 5\' 6"  (1.676 m)  Wt 183 lb 12.8 oz (83.371 kg)  BMI 29.68 kg/m2  SpO2 92%  Body mass index is 29.68 kg/(m^2).   No lower leg edema present  Assesment/PLAN:   1. DIABETES: Although her A1c is  relatively higher her blood sugars are fluctuating as discussed in history of present illness. She is still taking relatively small doses of insulin. Has not done many readings after dinner. Recommendation made today:  Increase frequency of exercise  More blood sugars after supper and consider increasing NovoLog at suppertime if needed  May try increasing Levemir if fasting blood sugars are higher related to nighttime snacks                                                                        2. 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 Has not gone up with stopping Florinef which appear to have caused some fluid overload/mild CHF in 1/15. Discussed low potassium diet the patient 3. CKD and nephropathy: Her creatinine has been variable but still mildly increased and stable. Taking Tekturna for proteinuria 4. Hyperlipidemia:  her LDL is still slightly below 100 with increasing her Crestor to 40 mg; her triglycerides are high and she needs to lose weight. 5. History of anemia: Last hemoglobin was normal and she can continue her iron  every other day 6. HYPOTHYROIDISM: Her TSH is consistently good on the current dose 7. Frequent UTI. No recent recurrence 8. Microalbuminuria: She will need a followup level 9. Complete physical exam on her next visit

## 2013-05-07 NOTE — Patient Instructions (Signed)
More sugars at 7-8 pm  May take 19-20 Levemir if having pm snacks  Exercise bike 3-4 x per week  Avoid foods with > 400mg  of potassium

## 2013-05-21 ENCOUNTER — Other Ambulatory Visit: Payer: Self-pay | Admitting: *Deleted

## 2013-05-21 MED ORDER — MAGNESIUM OXIDE 400 MG PO TABS: 800.0000 mg | ORAL_TABLET | Freq: Every day | ORAL | Status: DC

## 2013-06-06 ENCOUNTER — Telehealth (HOSPITAL_COMMUNITY): Payer: Self-pay | Admitting: *Deleted

## 2013-06-07 ENCOUNTER — Other Ambulatory Visit (HOSPITAL_COMMUNITY): Payer: Self-pay | Admitting: Cardiovascular Disease

## 2013-06-07 DIAGNOSIS — I6529 Occlusion and stenosis of unspecified carotid artery: Secondary | ICD-10-CM

## 2013-06-12 DIAGNOSIS — H4011X0 Primary open-angle glaucoma, stage unspecified: Secondary | ICD-10-CM | POA: Diagnosis not present

## 2013-06-12 DIAGNOSIS — H04129 Dry eye syndrome of unspecified lacrimal gland: Secondary | ICD-10-CM | POA: Diagnosis not present

## 2013-06-12 DIAGNOSIS — H409 Unspecified glaucoma: Secondary | ICD-10-CM | POA: Diagnosis not present

## 2013-06-13 ENCOUNTER — Ambulatory Visit (HOSPITAL_COMMUNITY)
Admission: RE | Admit: 2013-06-13 | Discharge: 2013-06-13 | Disposition: A | Discharge status: Home / Self Care | Payer: Medicare Other | Source: Ambulatory Visit | Attending: Internal Medicine | Admitting: Internal Medicine

## 2013-06-13 DIAGNOSIS — I6529 Occlusion and stenosis of unspecified carotid artery: Secondary | ICD-10-CM

## 2013-06-13 NOTE — Progress Notes (Signed)
Carotid Duplex Completed. Nyia Tsao, BS, RDMS, RVT  

## 2013-06-18 ENCOUNTER — Encounter: Payer: Self-pay | Admitting: *Deleted

## 2013-06-18 ENCOUNTER — Telehealth: Payer: Self-pay | Admitting: *Deleted

## 2013-06-18 DIAGNOSIS — N1330 Unspecified hydronephrosis: Secondary | ICD-10-CM | POA: Diagnosis not present

## 2013-06-18 DIAGNOSIS — N390 Urinary tract infection, site not specified: Secondary | ICD-10-CM | POA: Diagnosis not present

## 2013-06-18 DIAGNOSIS — I6529 Occlusion and stenosis of unspecified carotid artery: Secondary | ICD-10-CM

## 2013-06-18 NOTE — Telephone Encounter (Signed)
Order placed for repeat carotid dopplers in 1 year  

## 2013-06-18 NOTE — Telephone Encounter (Signed)
Message copied by Chauncy Lean on Mon Jun 18, 2013  4:25 PM ------      Message from: [name]      Created: Sat Jun 16, 2013  9:38 AM       No change from prior study. Repeat in 12 months. ------

## 2013-07-03 ENCOUNTER — Telehealth: Payer: Self-pay | Admitting: Endocrinology

## 2013-07-03 ENCOUNTER — Other Ambulatory Visit: Payer: Self-pay | Admitting: *Deleted

## 2013-07-03 MED ORDER — HYDROCODONE-ACETAMINOPHEN 5-325 MG PO TABS: 1.0000 | ORAL_TABLET | Freq: Three times a day (TID) | ORAL | Status: DC | PRN

## 2013-07-03 NOTE — Telephone Encounter (Signed)
Pt request hydrocodone script to be ready for pick up after lunch today please

## 2013-07-03 NOTE — Telephone Encounter (Signed)
Please see below. Thanks!

## 2013-07-06 ENCOUNTER — Other Ambulatory Visit (INDEPENDENT_AMBULATORY_CARE_PROVIDER_SITE_OTHER): Payer: Medicare Other

## 2013-07-06 DIAGNOSIS — N183 Chronic kidney disease, stage 3 unspecified: Secondary | ICD-10-CM

## 2013-07-06 DIAGNOSIS — E1129 Type 2 diabetes mellitus with other diabetic kidney complication: Secondary | ICD-10-CM

## 2013-07-06 DIAGNOSIS — E1165 Type 2 diabetes mellitus with hyperglycemia: Principal | ICD-10-CM

## 2013-07-06 LAB — URINALYSIS, ROUTINE W REFLEX MICROSCOPIC
Bilirubin Urine: NEGATIVE
Ketones, ur: NEGATIVE
Nitrite: NEGATIVE
PH: 7 (ref 5.0–8.0)
Specific Gravity, Urine: 1.015 (ref 1.000–1.030)
Total Protein, Urine: 30 — AB
Urine Glucose: NEGATIVE
Urobilinogen, UA: 0.2 (ref 0.0–1.0)

## 2013-07-06 LAB — CBC
HCT: 34.4 % — ABNORMAL LOW (ref 36.0–46.0)
HEMOGLOBIN: 11.3 g/dL — AB (ref 12.0–15.0)
MCHC: 32.8 g/dL (ref 30.0–36.0)
MCV: 90.3 fl (ref 78.0–100.0)
PLATELETS: 236 10*3/uL (ref 150.0–400.0)
RBC: 3.81 Mil/uL — ABNORMAL LOW (ref 3.87–5.11)
RDW: 14.4 % (ref 11.5–14.6)
WBC: 9.5 10*3/uL (ref 4.5–10.5)

## 2013-07-06 LAB — COMPREHENSIVE METABOLIC PANEL
ALT: 13 U/L (ref 0–35)
AST: 24 U/L (ref 0–37)
Albumin: 3.3 g/dL — ABNORMAL LOW (ref 3.5–5.2)
Alkaline Phosphatase: 40 U/L (ref 39–117)
BUN: 40 mg/dL — ABNORMAL HIGH (ref 6–23)
CO2: 25 meq/L (ref 19–32)
CREATININE: 1.7 mg/dL — AB (ref 0.4–1.2)
Calcium: 9.3 mg/dL (ref 8.4–10.5)
Chloride: 105 mEq/L (ref 96–112)
GFR: 30.55 mL/min — AB (ref >60.00)
GLUCOSE: 102 mg/dL — AB (ref 70–99)
Potassium: 4.8 mEq/L (ref 3.5–5.1)
Sodium: 139 mEq/L (ref 135–145)
Total Bilirubin: 0.2 mg/dL — ABNORMAL LOW (ref 0.3–1.2)
Total Protein: 6.6 g/dL (ref 6.0–8.3)

## 2013-07-06 LAB — MICROALBUMIN / CREATININE URINE RATIO
Creatinine,U: 64.4 mg/dL
MICROALB UR: 29.4 mg/dL — AB (ref 0.0–1.9)
Microalb Creat Ratio: 45.7 mg/g — ABNORMAL HIGH (ref 0.0–30.0)

## 2013-07-11 ENCOUNTER — Encounter: Payer: Self-pay | Admitting: Endocrinology

## 2013-07-11 ENCOUNTER — Ambulatory Visit (INDEPENDENT_AMBULATORY_CARE_PROVIDER_SITE_OTHER): Payer: Medicare Other | Admitting: Endocrinology

## 2013-07-11 VITALS — BP 124/62 | HR 65 | Temp 97.6°F | Resp 16 | Ht 65.5 in | Wt 182.0 lb

## 2013-07-11 DIAGNOSIS — N183 Chronic kidney disease, stage 3 unspecified: Secondary | ICD-10-CM

## 2013-07-11 DIAGNOSIS — M5430 Sciatica, unspecified side: Secondary | ICD-10-CM

## 2013-07-11 DIAGNOSIS — E1165 Type 2 diabetes mellitus with hyperglycemia: Secondary | ICD-10-CM

## 2013-07-11 DIAGNOSIS — I509 Heart failure, unspecified: Secondary | ICD-10-CM | POA: Diagnosis not present

## 2013-07-11 DIAGNOSIS — I6529 Occlusion and stenosis of unspecified carotid artery: Secondary | ICD-10-CM | POA: Diagnosis not present

## 2013-07-11 DIAGNOSIS — E1129 Type 2 diabetes mellitus with other diabetic kidney complication: Secondary | ICD-10-CM | POA: Diagnosis not present

## 2013-07-11 DIAGNOSIS — N390 Urinary tract infection, site not specified: Secondary | ICD-10-CM | POA: Diagnosis not present

## 2013-07-11 DIAGNOSIS — Z0000 Encounter for general adult medical examination without abnormal findings: Secondary | ICD-10-CM

## 2013-07-11 DIAGNOSIS — Z23 Encounter for immunization: Secondary | ICD-10-CM | POA: Diagnosis not present

## 2013-07-11 DIAGNOSIS — M5442 Lumbago with sciatica, left side: Secondary | ICD-10-CM

## 2013-07-11 DIAGNOSIS — Z1239 Encounter for other screening for malignant neoplasm of breast: Secondary | ICD-10-CM

## 2013-07-11 DIAGNOSIS — E039 Hypothyroidism, unspecified: Secondary | ICD-10-CM

## 2013-07-11 DIAGNOSIS — D649 Anemia, unspecified: Secondary | ICD-10-CM

## 2013-07-11 MED ORDER — FUROSEMIDE 20 MG PO TABS: ORAL_TABLET | ORAL | Status: DC

## 2013-07-11 NOTE — Progress Notes (Signed)
Patient ID: [name] [name], female   DOB: [birth year], 73 y.o.   MRN: [id number]   Reason for Appointment: Preventive and general exam  History of Present Illness   1. DIABETES: date of diagnosis: 38.    she has had long-standing Type 2 diabetes which has been treated more recently with low dose basal bolus insulin regimen. She has not been taking  any oral hypoglycemic drugs for some time The lowest previous HbgA1c  was 6.3, on 07/28/12.   Her blood sugars are well controlled with occasional high postprandial readings but also periodic high readings before her first meal She thinks blood sugar may be higher in the morning if she is having snack after supper, has good readings the last 3 times she has checked them in the morning. Not checking glucose much after meals  A1c in 2014 was gradually increasing although still relatively good at 7%, no recent level available  The insulin regimen is described as Levemir 18 units hs, Novolog 6 ac twice a day and she is compliant with her regimen.   Side effects from medications: Diarrhea from metformin and nausea and vomiting from GLP-1 drugs  Monitors blood glucose: 1.1 times a day  Glucometer: One Touch. Blood Glucose readings:   PREMEAL Breakfast  afternoon   5-9 p.m.  Bedtime Overall  Glucose range:  125-162   122-149   123-177     Mean/median:      143   Hypoglycemia frequency: None recently.   Meals: she is usually eating low fat meals but is frequently eating out at lunch; meals 11 AM,and Light supper at 5 pm.  Calorie intake: small supper, balanced lunch which is her main meal.  Physical activity: exercise: a little on exercise bike 2/7  Dietician visit: Most recent:, 5/13.   Wt Readings from Last 3 Encounters:  07/11/13 182 lb (82.555 kg)  05/07/13 183 lb 12.8 oz (83.371 kg)  04/06/13 187 lb 3.2 oz (84.913 kg)    Lab Results  Component Value Date   HGBA1C 7.0* 02/21/2013   HGBA1C 6.6* 01/04/2013   HGBA1C 6.4 11/08/2012   Lab  Results  Component Value Date   MICROALBUR 29.4* 07/06/2013   LDLCALC  Value: 39        Total Cholesterol/HDL:CHD Risk Coronary Heart Disease Risk Table                     Men   Women  1/2 Average Risk   3.4   3.3  Average Risk       5.0   4.4  2 X Average Risk   9.6   7.1  3 X Average Risk  23.4   11.0        Use the calculated Patient Ratio above and the CHD Risk Table to determine the patient's CHD Risk.        ATP III CLASSIFICATION (LDL):  <100     mg/dL   Optimal  100-129  mg/dL   Near or Above                    Optimal  130-159  mg/dL   Borderline  160-189  mg/dL   High  >190     mg/dL   Very High 01/12/2010   CREATININE 1.7* 07/06/2013     2. Dyspnea: She was seen on 04/06/13 for dyspnea. Chest x-ray showed only mild vascular congestion and she was improved with stopping Florinef and Lasix for  about 2 weeks. Off Lasix 3 weeks  and has had more dyspnea. Also weight has come down.   3. HYPERKALEMIA: Because of persistent mild increase in potassium she had been on Florinef 3 times a week. This was discontinued because of possible CHF as above. Potassium is upper normal now. She is usually not eating fruits and other high potassium foods CKD: Her creatinine is stable at 1.5 despite taking low dose Tekturna for nephropathy  4. HYPOTHYROIDISM: She has had long-standing primary hypothyroidism and this is adequately replaced, no unusual fatigue  Lab Results  Component Value Date   TSH 0.64 05/02/2013   PREVENTIVE CARE:  Diet: Usually low fat Exercise: Minimal History of falls: None   Annual hemoccults:           2013   Breast self exams:                             yes   Colonoscopy/sigmoidoscopy 9/11 Mammograms:  2 yrs  Yearly flu vaccine:                     2014  Bone Density:   2009   Calcium supplements:  taking  Tetanus booster:  ?   Zostavax: 2003 Pneumovax: 2008        Medication List       This list is accurate as of: 07/11/13 11:59 PM.  Always use your most recent med list.                albuterol 108 (90 BASE) MCG/ACT inhaler  Commonly known as:  PROVENTIL HFA;VENTOLIN HFA  Inhale 2 puffs into the lungs every 6 (six) hours as needed for wheezing.     aliskiren 150 MG tablet  Commonly known as:  TEKTURNA  Take 1 tablet (150 mg total) by mouth daily.     aspirin 81 MG chewable tablet  Chew 81 mg by mouth at bedtime.     calcium-vitamin D 500-200 MG-UNIT per tablet  Commonly known as:  OSCAL WITH D  Take 1 tablet by mouth every morning.     Choline Fenofibrate 135 MG capsule  Commonly known as:  TRILIPIX  Take 1 capsule (135 mg total) by mouth at bedtime.     cilostazol 100 MG tablet  Commonly known as:  PLETAL  Take 100 mg by mouth 2 (two) times daily.     clopidogrel 75 MG tablet  Commonly known as:  PLAVIX  Take 1 tablet (75 mg total) by mouth every morning.     docusate sodium 100 MG capsule  Commonly known as:  COLACE  Take 100 mg by mouth 2 (two) times daily as needed for constipation.     ferrous sulfate 325 (65 FE) MG tablet  Take 325 mg by mouth at bedtime.     furosemide 20 MG tablet  Commonly known as:  LASIX  Take 2 tablets the first day, then one tablet daily     glucose blood test strip  TEST AS DIRECTED 3 TIMES DAILY     HYDROcodone-acetaminophen 5-325 MG per tablet  Commonly known as:  NORCO/VICODIN  Take 1 tablet by mouth 3 (three) times daily as needed.     insulin aspart 100 UNIT/ML FlexPen  Commonly known as:  NOVOLOG  Inject 7 Units into the skin 3 (three) times daily with meals. 7 units at breakfast and 6 units at lunch and dinner  Insulin Detemir 100 UNIT/ML Pen  Commonly known as:  LEVEMIR FLEXTOUCH  Inject 18 units at bedtime     Insulin Pen Needle 32G X 4 MM Misc  Commonly known as:  BD PEN NEEDLE NANO U/F  Use 3 needles per day     levothyroxine 137 MCG tablet  Commonly known as:  SYNTHROID, LEVOTHROID  Take 1 tablet (137 mcg total) by mouth daily before breakfast.     magnesium oxide 400 MG  tablet  Commonly known as:  MAG-OX  Take 2 tablets (800 mg total) by mouth at bedtime.     metoprolol succinate 25 MG 24 hr tablet  Commonly known as:  TOPROL-XL  Take 0.5 tablets (12.5 mg total) by mouth every morning.     omeprazole 20 MG capsule  Commonly known as:  PRILOSEC  Take 1 capsule (20 mg total) by mouth daily.     oxybutynin 5 MG tablet  Commonly known as:  DITROPAN  Take 5 mg by mouth at bedtime.     rosuvastatin 40 MG tablet  Commonly known as:  CRESTOR  Take 1 tablet daily     vitamin B-12 1000 MCG tablet  Commonly known as:  CYANOCOBALAMIN  Take 1,000 mcg by mouth every morning.     vitamin C 500 MG tablet  Commonly known as:  ASCORBIC ACID  Take 500 mg by mouth every morning.     Vitamin D (Ergocalciferol) 50000 UNITS Caps capsule  Commonly known as:  DRISDOL  Take 1 capsule (50,000 Units total) by mouth every 7 (seven) days. On thursdays        Allergies: No Known Allergies  Past Medical History  Diagnosis Date  . Diabetes mellitus without complication     type 2  . CHF (congestive heart failure)   . Coronary artery disease   . AAA (abdominal aortic aneurysm) 03/09/2012  . PVD (peripheral vascular disease)   . Carotid artery stenosis     carotid doppler 06/2012 - Right CCA/Bulb/ICA with chronic occlusion; L vertebral artery with abnormal blood flow; L Bulb/Prox ICA  s/p endarterectomy with mild fibrous plaque, 50% diameter reduction  . Hypertension   . Hyperlipidemia   . Hypothyroidism   . History of nuclear stress test 11/24/2011    lexiscan; nonischemic, low risk   . PAD (peripheral artery disease)     09/2010 LEAs - R ABI of 0.45, occluded fem-pop bypass graft, L ABI of 0.59 with occluded SFA; severe arterial insuff  . Ischemic cardiomyopathy     EF 03/2010 50-55%    Past Surgical History  Procedure Laterality Date  . Coronary artery bypass graft  1997    x6; internal mammary to LAD, SVG to ramus #1 & #2, SVG to OM, SVG to PDA,   .  Abdominal hysterectomy  1986  . Tumor removal    . Coronary stent placement    . Femoral artery repair    . Cystoscopy w/ ureteral stent placement  03/10/2012    Procedure: CYSTOSCOPY WITH RETROGRADE PYELOGRAM/URETERAL STENT PLACEMENT;  Surgeon: Hanley Ben, MD;  Location: [facility];  Service: Urology;  Laterality: Left;  . Carotid endarterectomy Bilateral   . Aortobifemoral bypass grafting  1997    with R fem-pop  . Cardiac catheterization  12/2009    occluded vein to OM & ramus branches, patent vein to PDA, patent LIMA to LAD (Dr. [name], [facility]) - later had thrombectomy of R fem-pop bypass ad R common femoral & profunda femoris artery (Dr.  Fields)  . Transthoracic echocardiogram  03/25/2010    EF 99991111, LV systolic function low normal with mild inferoseptal hypocontractility; LA mildly dilated; mod MR; mild TR, RV systolic pressure elevated, mild pulm HTN; AV mildly sclerotic; mild pulm valve regurg; aortic root sclerosis/calcif     Family History  Problem Relation Age of Onset  . Congestive Heart Failure Mother   . Diabetes Mother   . Stroke Father   . Cancer Maternal Aunt     Breast cancer    Social History:  reports that she quit smoking about 4 years ago. She has never used smokeless tobacco. She reports that she does not drink alcohol or use illicit drugs.  Review of Systems   She has had history of CAD with coronary bypass surgery, no recent chest pain on exertion  Appetite normal, has no nausea, has normal bowel movements without any recent change. No abdominal pain. She feels some bloating in her upper abdominal area  Hyperlipidemia: Has history of high LDL and triglycerides/low HDL  She is compliant with her current doses of 40 mg Crestor and Trilipix, LDL better in 12/14 No issues with liver dysfunction recently  Lab Results  Component Value Date   CHOL 163 02/23/2013   HDL 31.60* 02/23/2013   LDLCALC  Value: 39        Total Cholesterol/HDL:CHD Risk Coronary Heart  Disease Risk Table                     Men   Women  1/2 Average Risk   3.4   3.3  Average Risk       5.0   4.4  2 X Average Risk   9.6   7.1  3 X Average Risk  23.4   11.0        Use the calculated Patient Ratio above and the CHD Risk Table to determine the patient's CHD Risk.        ATP III CLASSIFICATION (LDL):  <100     mg/dL   Optimal  100-129  mg/dL   Near or Above                    Optimal  130-159  mg/dL   Borderline  160-189  mg/dL   High  >190     mg/dL   Very High 01/12/2010   LDLDIRECT 96.2 05/02/2013   TRIG 290.0* 02/23/2013   CHOLHDL 5 02/23/2013    Iron Deficiency anemia:  Has had anemia with vitamin B deficiency and  also iron deficiency.   Previous history: Iron saturation was 19 in 2/14. .  With iron infusion her hemoglobin was back up to 11.3. On iron qod. Also taking B12 Hemoccult was negative in 9/13.  Last colonoscopy was in 2011 when she was admitted for diarrhea  Lab Results  Component Value Date   WBC 9.5 07/06/2013   HGB 11.3* 07/06/2013   HCT 34.4* 07/06/2013   MCV 90.3 07/06/2013   PLT 236.0 07/06/2013    Vitamin D deficiency: She has been on supplements for quite some time Bone density: T Score at hip -2.2 in 11/07 and -2.6 in 9/09. Had Reclast in 12/09  Recurrent UTI: Had last infection in 415, treated by urologist; currently has a pressure sensation with burning and urinalysis is abnormal  No history of numbness or tingling in her feet.  Back and legs have been having pain especially on the left side at the lower level and  radiating down the left side  HYPOTHYROIDISM: She has had long-standing primary hypothyroidism which is quite stable with her regimen of levothyroxine 137 mcg. However she does take her calcium at the same time  Lab Results  Component Value Date   FREET4 1.36 01/04/2013   FREET4 1.47 10/02/2012   FREET4 1.65 03/10/2012   TSH 0.64 05/02/2013   TSH 1.522 01/04/2013   TSH 0.39 10/02/2012      LABS:  Appointment on 07/06/2013  Component  Date Value Ref Range Status  . Microalb, Ur 07/06/2013 29.4* 0.0 - 1.9 mg/dL Final  . Creatinine,U 07/06/2013 64.4   Final  . Microalb Creat Ratio 07/06/2013 45.7* 0.0 - 30.0 mg/g Final  . Color, Urine 07/06/2013 YELLOW  Yellow;Lt. Yellow Final  . APPearance 07/06/2013 Cloudy* Clear Final  . Specific Gravity, Urine 07/06/2013 1.015  1.000-1.030 Final  . pH 07/06/2013 7.0  5.0 - 8.0 Final  . Total Protein, Urine 07/06/2013 30* Negative Final  . Urine Glucose 07/06/2013 NEGATIVE  Negative Final  . Ketones, ur 07/06/2013 NEGATIVE  Negative Final  . Bilirubin Urine 07/06/2013 NEGATIVE  Negative Final  . Hgb urine dipstick 07/06/2013 SMALL* Negative Final  . Urobilinogen, UA 07/06/2013 0.2  0.0 - 1.0 Final  . Leukocytes, UA 07/06/2013 LARGE* Negative Final  . Nitrite 07/06/2013 NEGATIVE  Negative Final  . WBC, UA 07/06/2013 TNTC(>50/hpf)* 0-2/hpf Final  . RBC / HPF 07/06/2013 7-10/hpf* 0-2/hpf Final  . Squamous Epithelial / LPF 07/06/2013 Few(5-10/hpf)* Rare(0-4/hpf) Final  . Bacteria, UA 07/06/2013 Rare(<10/hpf)* None Final  . Sodium 07/06/2013 139  135 - 145 mEq/L Final  . Potassium 07/06/2013 4.8  3.5 - 5.1 mEq/L Final  . Chloride 07/06/2013 105  96 - 112 mEq/L Final  . CO2 07/06/2013 25  19 - 32 mEq/L Final  . Glucose, Bld 07/06/2013 102* 70 - 99 mg/dL Final  . BUN 07/06/2013 40* 6 - 23 mg/dL Final  . Creatinine, Ser 07/06/2013 1.7* 0.4 - 1.2 mg/dL Final  . Total Bilirubin 07/06/2013 0.2* 0.3 - 1.2 mg/dL Final  . Alkaline Phosphatase 07/06/2013 40  39 - 117 U/L Final  . AST 07/06/2013 24  0 - 37 U/L Final  . ALT 07/06/2013 13  0 - 35 U/L Final  . Total Protein 07/06/2013 6.6  6.0 - 8.3 g/dL Final  . Albumin 07/06/2013 3.3* 3.5 - 5.2 g/dL Final  . Calcium 07/06/2013 9.3  8.4 - 10.5 mg/dL Final  . GFR 07/06/2013 30.55* >60.00 mL/min Final  . WBC 07/06/2013 9.5  4.5 - 10.5 K/uL Final  . RBC 07/06/2013 3.81* 3.87 - 5.11 Mil/uL Final  . Platelets 07/06/2013 236.0  150.0 - 400.0 K/uL  Final  . Hemoglobin 07/06/2013 11.3* 12.0 - 15.0 g/dL Final  . HCT 07/06/2013 34.4* 36.0 - 46.0 % Final  . MCV 07/06/2013 90.3  78.0 - 100.0 fl Final  . MCHC 07/06/2013 32.8  30.0 - 36.0 g/dL Final  . RDW 07/06/2013 14.4  11.5 - 14.6 % Final      Examination:   BP 124/62  Pulse 65  Temp(Src) 97.6 F (36.4 C) (Oral)  Resp 16  Ht 5' 5.5" (1.664 m)  Wt 182 lb (82.555 kg)  BMI 29.82 kg/m2  SpO2 95%  Body mass index is 29.82 kg/(m^2).   Physical Exam  Constitutional: She appears well-developed and well-nourished.  Eyes: Conjunctivae are normal.  Fundi show no changes of retinopathy  Neck: No thyromegaly present.  No lymphadenopathy  Cardiovascular: Regular rhythm and normal heart sounds.  No murmur heard. Pulmonary/Chest: Breath sounds normal. She has no wheezes. She has no rales.  Abdominal: She exhibits no distension. There is no hepatosplenomegaly. There is no tenderness.  Musculoskeletal: She exhibits no edema.  Mild tenderness of left SI joint present  Neurological: She is alert. She displays no tremor. No cranial nerve deficit or sensory deficit.  Reflex Scores:      Achilles reflexes are 2+ on the right side and 2+ on the left side. Vibration sense moderately reduced distally in the toes  Skin: No rash noted.  No breast mass felt bilaterally.  Assesment/PLAN:   1. DIABETES: She appears to have relatively higher fasting blood sugars even with good control of mealtime readings most of the time. Again has difficulty losing weight especially with difficulty with exercise. She will increase her Levemir to 20 units to improve morning blood sugars. She does need an A1c on the next visit 2. Dyspnea on exertion. Although she does not have any objective signs today she did have some signs of increased vascular congestion on chest x-ray in 1/15 and is subjectively doing better with taking furosemide 20 mg. Will restart this but for now will use 20 mg every other day to avoid volume  depletion and lower blood pressure readings                                                                         3. 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  the level has not gone up with stopping Florinef  and also stable with starting Tekturna. She will continue potassium restriction  4. CKD and nephropathy: Her creatinine has been variable but still mildly increased. Taking Tekturna for proteinuria with significant improvement in her microalbuminuria 5. Hyperlipidemia:  her  lipids will need to be rechecked on her next visit. On the last visit LDL  was slightly below 100 with increasing her Crestor to 40 mg; her triglycerides  were high and she needs to lose weight. 6. History of anemia: recent hemoglobin was  slightly low probably from renal dysfunction;  will check her iron level on the next visit and she can continue her iron  every other dayfor now  7. HYPOTHYROIDISM: Her TSH is consistently good on the current doseAnd will need followup on the next visit  8. Frequent UTI. she has another recurrence despite being treated by urologist with antibiotics fairly recently. Will fax the information to his office and have him consider taking trimethoprim as prophylaxis  9. Discussed need for preventive care with repeat mammogram, well-balanced diet restricted calories, exercise as tolerated, stool Hemoccult which will be given today, need for Prevnar which was given today 10.  low back pain: She will be referred for orthopedic evaluation since she continues to have pain in the low back area and has some tenderness of the SI joint 11. Asymptomatic carotid bruit on the left: She will continue followup with cardiologist  [name] 07/12/2013 10:10 AM

## 2013-07-11 NOTE — Patient Instructions (Addendum)
Stool hemoccult to take home  Calcium to be taken in pms  Levemir 20 units daily  Try Taking Lasix 20 every 2 days  Call Dr Janice Norrie re: Infection

## 2013-07-12 ENCOUNTER — Encounter: Payer: Self-pay | Admitting: Endocrinology

## 2013-07-16 DIAGNOSIS — N390 Urinary tract infection, site not specified: Secondary | ICD-10-CM | POA: Diagnosis not present

## 2013-07-25 ENCOUNTER — Ambulatory Visit
Admission: RE | Admit: 2013-07-25 | Discharge: 2013-07-25 | Disposition: A | Discharge status: Home / Self Care | Payer: Medicare Other | Source: Ambulatory Visit | Attending: Endocrinology | Admitting: Endocrinology

## 2013-07-25 DIAGNOSIS — Z1231 Encounter for screening mammogram for malignant neoplasm of breast: Secondary | ICD-10-CM | POA: Diagnosis not present

## 2013-07-25 DIAGNOSIS — Z1239 Encounter for other screening for malignant neoplasm of breast: Secondary | ICD-10-CM

## 2013-07-31 ENCOUNTER — Other Ambulatory Visit: Payer: Self-pay | Admitting: *Deleted

## 2013-07-31 MED ORDER — CLOPIDOGREL BISULFATE 75 MG PO TABS: 75.0000 mg | ORAL_TABLET | Freq: Every morning | ORAL | Status: DC

## 2013-07-31 MED ORDER — LEVOTHYROXINE SODIUM 137 MCG PO TABS: 137.0000 ug | ORAL_TABLET | Freq: Every day | ORAL | Status: DC

## 2013-08-06 ENCOUNTER — Other Ambulatory Visit: Payer: Self-pay | Admitting: *Deleted

## 2013-08-06 ENCOUNTER — Telehealth: Payer: Self-pay | Admitting: Endocrinology

## 2013-08-06 MED ORDER — HYDROCODONE-ACETAMINOPHEN 5-325 MG PO TABS: 1.0000 | ORAL_TABLET | Freq: Three times a day (TID) | ORAL | Status: DC | PRN

## 2013-08-06 NOTE — Telephone Encounter (Signed)
Pt needs hydrocodone rx,

## 2013-08-09 DIAGNOSIS — M5430 Sciatica, unspecified side: Secondary | ICD-10-CM | POA: Diagnosis not present

## 2013-08-09 DIAGNOSIS — M47817 Spondylosis without myelopathy or radiculopathy, lumbosacral region: Secondary | ICD-10-CM | POA: Diagnosis not present

## 2013-08-19 DIAGNOSIS — M47817 Spondylosis without myelopathy or radiculopathy, lumbosacral region: Secondary | ICD-10-CM | POA: Diagnosis not present

## 2013-08-20 ENCOUNTER — Other Ambulatory Visit (INDEPENDENT_AMBULATORY_CARE_PROVIDER_SITE_OTHER): Payer: Medicare Other

## 2013-08-20 DIAGNOSIS — E1165 Type 2 diabetes mellitus with hyperglycemia: Principal | ICD-10-CM

## 2013-08-20 DIAGNOSIS — D649 Anemia, unspecified: Secondary | ICD-10-CM | POA: Diagnosis not present

## 2013-08-20 DIAGNOSIS — E039 Hypothyroidism, unspecified: Secondary | ICD-10-CM | POA: Diagnosis not present

## 2013-08-20 DIAGNOSIS — N183 Chronic kidney disease, stage 3 unspecified: Secondary | ICD-10-CM

## 2013-08-20 DIAGNOSIS — E1129 Type 2 diabetes mellitus with other diabetic kidney complication: Secondary | ICD-10-CM

## 2013-08-20 LAB — CBC
HEMATOCRIT: 35.4 % — AB (ref 36.0–46.0)
Hemoglobin: 11.8 g/dL — ABNORMAL LOW (ref 12.0–15.0)
MCHC: 33.5 g/dL (ref 30.0–36.0)
MCV: 89.3 fl (ref 78.0–100.0)
PLATELETS: 224 10*3/uL (ref 150.0–400.0)
RBC: 3.96 Mil/uL (ref 3.87–5.11)
RDW: 14 % (ref 11.5–15.5)
WBC: 5.2 10*3/uL (ref 4.0–10.5)

## 2013-08-20 LAB — LIPID PANEL
CHOL/HDL RATIO: 4
Cholesterol: 188 mg/dL (ref 0–200)
HDL: 42.5 mg/dL (ref >39.00)
LDL Cholesterol: 69 mg/dL (ref 0–99)
NonHDL: 145.5
Triglycerides: 384 mg/dL — ABNORMAL HIGH (ref 0.0–149.0)
VLDL: 76.8 mg/dL — ABNORMAL HIGH (ref 0.0–40.0)

## 2013-08-20 LAB — BASIC METABOLIC PANEL
BUN: 48 mg/dL — ABNORMAL HIGH (ref 6–23)
CHLORIDE: 105 meq/L (ref 96–112)
CO2: 25 mEq/L (ref 19–32)
Calcium: 9.7 mg/dL (ref 8.4–10.5)
Creatinine, Ser: 1.9 mg/dL — ABNORMAL HIGH (ref 0.4–1.2)
GFR: 28.1 mL/min — ABNORMAL LOW (ref >60.00)
Glucose, Bld: 166 mg/dL — ABNORMAL HIGH (ref 70–99)
Potassium: 5.6 mEq/L — ABNORMAL HIGH (ref 3.5–5.1)
Sodium: 140 mEq/L (ref 135–145)

## 2013-08-20 LAB — IBC PANEL
IRON: 123 ug/dL (ref 42–145)
Saturation Ratios: 38.3 % (ref 20.0–50.0)
Transferrin: 229.1 mg/dL (ref 212.0–360.0)

## 2013-08-20 LAB — LDL CHOLESTEROL, DIRECT: Direct LDL: 98.2 mg/dL

## 2013-08-20 LAB — TSH: TSH: 0.14 u[IU]/mL — AB (ref 0.35–4.50)

## 2013-08-20 LAB — HEMOGLOBIN A1C: Hgb A1c MFr Bld: 6.7 % — ABNORMAL HIGH (ref 4.6–6.5)

## 2013-08-20 LAB — T4, FREE: Free T4: 1.42 ng/dL (ref 0.60–1.60)

## 2013-08-23 ENCOUNTER — Encounter: Payer: Self-pay | Admitting: *Deleted

## 2013-08-23 ENCOUNTER — Ambulatory Visit (INDEPENDENT_AMBULATORY_CARE_PROVIDER_SITE_OTHER): Payer: Medicare Other | Admitting: Endocrinology

## 2013-08-23 ENCOUNTER — Encounter: Payer: Self-pay | Admitting: Endocrinology

## 2013-08-23 VITALS — BP 128/54 | HR 78 | Temp 98.1°F | Resp 16 | Ht 65.5 in | Wt 178.0 lb

## 2013-08-23 DIAGNOSIS — Z79899 Other long term (current) drug therapy: Secondary | ICD-10-CM | POA: Diagnosis not present

## 2013-08-23 DIAGNOSIS — R0989 Other specified symptoms and signs involving the circulatory and respiratory systems: Secondary | ICD-10-CM | POA: Diagnosis not present

## 2013-08-23 DIAGNOSIS — E785 Hyperlipidemia, unspecified: Secondary | ICD-10-CM | POA: Diagnosis not present

## 2013-08-23 DIAGNOSIS — E1129 Type 2 diabetes mellitus with other diabetic kidney complication: Secondary | ICD-10-CM

## 2013-08-23 DIAGNOSIS — R0609 Other forms of dyspnea: Secondary | ICD-10-CM | POA: Diagnosis not present

## 2013-08-23 DIAGNOSIS — E875 Hyperkalemia: Secondary | ICD-10-CM

## 2013-08-23 DIAGNOSIS — E1165 Type 2 diabetes mellitus with hyperglycemia: Principal | ICD-10-CM

## 2013-08-23 DIAGNOSIS — I6529 Occlusion and stenosis of unspecified carotid artery: Secondary | ICD-10-CM | POA: Diagnosis not present

## 2013-08-23 DIAGNOSIS — R0600 Dyspnea, unspecified: Secondary | ICD-10-CM

## 2013-08-23 NOTE — Progress Notes (Signed)
Patient ID: [name] [name], female   DOB: Mar 16, 1940, 73 y.o.   MRN: [id number]   Reason for Appointment: Followup of multiple problems  History of Present Illness   Type 2 diabetes mellitus, date of diagnosis: 1986.   The patient is seen today in follow up of long-standing Type 2 diabetes which has been treated more recently with low dose basal bolus insulin regimen. She has not been taking  any oral hypoglycemic drugs for some time The lowest previous HbgA1c  was 6.3, on 07/28/12.   Her blood sugars are overall controlled with occasional high  Readings even though her insulin has been gradually increased, both basal and bolus She thinks blood sugar may be higher in the morning if she is having snack after supper Usually not checking readings after meals A1c appears to be slightly better and still relatively lower than expected for her blood sugar levels She is not able to exercise much because of low back pain but her weight has improved  The insulin regimen is described as Levemir 20 units hs, Novolog 8 ac twice a day and she is compliant with her regimen.   Side effects from medications: Diarrhea from metformin and nausea and vomiting from GLP-1 drugs  Monitors blood glucose: 1.1 times a day  Glucometer: One Touch.  Blood Glucose readings:   PREMEAL Breakfast Lunch Dinner Bedtime Overall  Glucose range:  131-163    127-163   154, 165    Median:  150    140    142    Hypoglycemia frequency: None recently.   Meals: she is usually eating low fat meals but is frequently eating out at lunch; meals 11 AM,and Light supper at 5 pm.  Calorie intake: small supper, usually late breakfast  Physical activity: exercise: a little on exercise bike Dietician visit: Most recent:, 5/13.   Wt Readings from Last 3 Encounters:  08/23/13 178 lb (80.74 kg)  07/11/13 182 lb (82.555 kg)  05/07/13 183 lb 12.8 oz (83.371 kg)    Lab Results  Component Value Date   HGBA1C 6.7* 08/20/2013   HGBA1C  7.0* 02/21/2013   HGBA1C 6.6* 01/04/2013   Lab Results  Component Value Date   MICROALBUR 29.4* 07/06/2013   LDLCALC 69 08/20/2013   CREATININE 1.9* 08/20/2013       2. Dyspnea: Started in 04/06/13 . Chest x-ray showed only mild vascular congestion and she was subjectively improved with stopping Florinef and taking Lasix. Since dyspnea had recurred after stopping Lasix she is not taking this every other day with fairly good relief of symptoms. Again has not had any lower leg edema   3. HYPERKALEMIA: Because of persistent mild increase in potassium she had been on Florinef 3 times a week. This was discontinued because of possible CHF and dyspnea previously. Potassium is relatively high again. She is usually not eating fruits except watermelon  CKD: Her creatinine is slightly higher but has been taking low dose Tekturna for nephropathy, this is not new  4. HYPOTHYROIDISM: She has had long-standing primary hypothyroidism and although she is requiring relatively lower doses of supplements her TSH is again relatively low  Lab Results  Component Value Date   TSH 0.14* 08/20/2013        Medication List       This list is accurate as of: 08/23/13  1:42 PM.  Always use your most recent med list.               albuterol  108 (90 BASE) MCG/ACT inhaler  Commonly known as:  PROVENTIL HFA;VENTOLIN HFA  Inhale 2 puffs into the lungs every 6 (six) hours as needed for wheezing.     aliskiren 150 MG tablet  Commonly known as:  TEKTURNA  Take 1 tablet (150 mg total) by mouth daily.     aspirin 81 MG chewable tablet  Chew 81 mg by mouth at bedtime.     calcium-vitamin D 500-200 MG-UNIT per tablet  Commonly known as:  OSCAL WITH D  Take 1 tablet by mouth every morning.     Choline Fenofibrate 135 MG capsule  Commonly known as:  TRILIPIX  Take 1 capsule (135 mg total) by mouth at bedtime.     cilostazol 100 MG tablet  Commonly known as:  PLETAL  Take 100 mg by mouth 2 (two) times daily.      clopidogrel 75 MG tablet  Commonly known as:  PLAVIX  Take 1 tablet (75 mg total) by mouth every morning.     docusate sodium 100 MG capsule  Commonly known as:  COLACE  Take 100 mg by mouth 2 (two) times daily as needed for constipation.     ferrous sulfate 325 (65 FE) MG tablet  Take 325 mg by mouth at bedtime.     furosemide 20 MG tablet  Commonly known as:  LASIX  Take 2 tablets the first day, then one tablet daily     glucose blood test strip  TEST AS DIRECTED 3 TIMES DAILY     HYDROcodone-acetaminophen 5-325 MG per tablet  Commonly known as:  NORCO/VICODIN  Take 1 tablet by mouth 3 (three) times daily as needed.     insulin aspart 100 UNIT/ML FlexPen  Commonly known as:  NOVOLOG  Inject 7 Units into the skin 3 (three) times daily with meals. 7 units at breakfast and 6 units at lunch and dinner     Insulin Detemir 100 UNIT/ML Pen  Commonly known as:  LEVEMIR FLEXTOUCH  Inject 18 units at bedtime     Insulin Pen Needle 32G X 4 MM Misc  Commonly known as:  BD PEN NEEDLE NANO U/F  Use 3 needles per day     levothyroxine 137 MCG tablet  Commonly known as:  SYNTHROID, LEVOTHROID  Take 1 tablet (137 mcg total) by mouth daily before breakfast.     magnesium oxide 400 MG tablet  Commonly known as:  MAG-OX  Take 2 tablets (800 mg total) by mouth at bedtime.     metoprolol succinate 25 MG 24 hr tablet  Commonly known as:  TOPROL-XL  Take 0.5 tablets (12.5 mg total) by mouth every morning.     omeprazole 20 MG capsule  Commonly known as:  PRILOSEC  Take 1 capsule (20 mg total) by mouth daily.     oxybutynin 5 MG tablet  Commonly known as:  DITROPAN  Take 5 mg by mouth at bedtime.     rosuvastatin 40 MG tablet  Commonly known as:  CRESTOR  Take 1 tablet daily     vitamin B-12 1000 MCG tablet  Commonly known as:  CYANOCOBALAMIN  Take 1,000 mcg by mouth every morning.     vitamin C 500 MG tablet  Commonly known as:  ASCORBIC ACID  Take 500 mg by mouth  every morning.     Vitamin D (Ergocalciferol) 50000 UNITS Caps capsule  Commonly known as:  DRISDOL  Take 1 capsule (50,000 Units total) by mouth every 7 (seven) days. On  thursdays        Allergies: No Known Allergies  Past Medical History  Diagnosis Date  . Diabetes mellitus without complication     type 2  . CHF (congestive heart failure)   . Coronary artery disease   . AAA (abdominal aortic aneurysm) 03/09/2012  . PVD (peripheral vascular disease)   . Carotid artery stenosis     carotid doppler 06/2012 - Right CCA/Bulb/ICA with chronic occlusion; L vertebral artery with abnormal blood flow; L Bulb/Prox ICA  s/p endarterectomy with mild fibrous plaque, 50% diameter reduction  . Hypertension   . Hyperlipidemia   . Hypothyroidism   . History of nuclear stress test 11/24/2011    lexiscan; nonischemic, low risk   . PAD (peripheral artery disease)     09/2010 LEAs - R ABI of 0.45, occluded fem-pop bypass graft, L ABI of 0.59 with occluded SFA; severe arterial insuff  . Ischemic cardiomyopathy     EF 03/2010 50-55%    Past Surgical History  Procedure Laterality Date  . Coronary artery bypass graft  1997    x6; internal mammary to LAD, SVG to ramus #1 & #2, SVG to OM, SVG to PDA,   . Abdominal hysterectomy  1986  . Tumor removal    . Coronary stent placement    . Femoral artery repair    . Cystoscopy w/ ureteral stent placement  03/10/2012    Procedure: CYSTOSCOPY WITH RETROGRADE PYELOGRAM/URETERAL STENT PLACEMENT;  Surgeon: Hanley Ben, MD;  Location: [facility];  Service: Urology;  Laterality: Left;  . Carotid endarterectomy Bilateral   . Aortobifemoral bypass grafting  1997    with R fem-pop  . Cardiac catheterization  12/2009    occluded vein to OM & ramus branches, patent vein to PDA, patent LIMA to LAD (Dr. [name], [facility]) - later had thrombectomy of R fem-pop bypass ad R common femoral & profunda femoris artery (Dr. Oneida Alar)  . Transthoracic echocardiogram  03/25/2010    EF  99991111, LV systolic function low normal with mild inferoseptal hypocontractility; LA mildly dilated; mod MR; mild TR, RV systolic pressure elevated, mild pulm HTN; AV mildly sclerotic; mild pulm valve regurg; aortic root sclerosis/calcif     Family History  Problem Relation Age of Onset  . Congestive Heart Failure Mother   . Diabetes Mother   . Stroke Father   . Cancer Maternal Aunt     Breast cancer    Social History:  reports that she quit smoking about 4 years ago. She has never used smokeless tobacco. She reports that she does not drink alcohol or use illicit drugs.  Review of Systems -  Cardiovascular ROS:  positive for - CAD and PVD, no recent symptoms  Hyperlipidemia: Has history of high LDL and triglycerides/low HDL  She is compliant with her current doses of 20 mg Crestor and Trilipix Tends to have high triglycerides despite taking both drugs No issues with liver dysfunction recently  Lab Results  Component Value Date   CHOL 188 08/20/2013   HDL 42.50 08/20/2013   LDLCALC 69 08/20/2013   LDLDIRECT 98.2 08/20/2013   TRIG 384.0* 08/20/2013   CHOLHDL 4 08/20/2013    Iron Deficiency anemia:  Has had  anemia with  persistently low hemoglobin and also iron deficiency. Most recent hemoglobin is near-normal Previous history: In 8/13 she had low iron saturation and was 19 in 2/14. Also taking B12.  With iron infusion her hemoglobin was back up to 11.3.   Hemoccult was negative in  9/13.   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 Hemoglobin A1C 08/20/2013 6.7* 4.6 - 6.5 % Final   Glycemic Control Guidelines for  People with Diabetes:Non Diabetic:  <6%Goal of Therapy: <7%Additional Action Suggested:  >8%   . Sodium 08/20/2013 140  135 - 145 mEq/L Final  . Potassium 08/20/2013 5.6* 3.5 - 5.1 mEq/L Final  . Chloride 08/20/2013 105  96 - 112 mEq/L Final  . CO2 08/20/2013 25  19 - 32 mEq/L Final  . Glucose, Bld 08/20/2013 166* 70 - 99 mg/dL Final  . BUN 08/20/2013 48* 6 - 23 mg/dL Final  . Creatinine, Ser 08/20/2013 1.9* 0.4 - 1.2 mg/dL Final  . Calcium 08/20/2013 9.7  8.4 - 10.5 mg/dL Final  . GFR 08/20/2013 28.10* >60.00 mL/min Final  . TSH 08/20/2013 0.14* 0.35 - 4.50 uIU/mL Final  . Free T4 08/20/2013 1.42  0.60 - 1.60 ng/dL Final  . Cholesterol 08/20/2013 188  0 - 200 mg/dL Final   ATP III Classification       Desirable:  < 200 mg/dL               Borderline High:  200 - 239 mg/dL          High:  > = 240 mg/dL  . Triglycerides 08/20/2013 384.0* 0.0 - 149.0 mg/dL Final   Normal:  <150 mg/dLBorderline High:  150 - 199 mg/dL  . HDL 08/20/2013 42.50  >39.00 mg/dL Final  . VLDL 08/20/2013 76.8* 0.0 - 40.0 mg/dL Final  . LDL Cholesterol 08/20/2013 69  0 - 99 mg/dL Final  . Total CHOL/HDL Ratio 08/20/2013 4   Final                  Men          Women1/2 Average Risk     3.4          3.3Average Risk          5.0          4.42X Average Risk          9.6          7.13X Average Risk          15.0          11.0                      . NonHDL 08/20/2013 145.50   Final  . Direct LDL 08/20/2013 98.2   Final   Optimal:  <100 mg/dLNear or Above Optimal:  100-129 mg/dLBorderline High:  130-159 mg/dLHigh:  160-189 mg/dLVery High:  >190 mg/dL  . WBC 08/20/2013 5.2  4.0 - 10.5 K/uL Final  . RBC 08/20/2013 3.96  3.87 - 5.11 Mil/uL Final  . Platelets 08/20/2013 224.0  150.0 - 400.0 K/uL Final  . Hemoglobin 08/20/2013 11.8* 12.0 - 15.0 g/dL Final  . HCT 08/20/2013 35.4* 36.0 - 46.0 % Final  . MCV 08/20/2013 89.3  78.0 - 100.0 fl Final  . MCHC 08/20/2013 33.5  30.0 - 36.0 g/dL Final  . RDW 08/20/2013  14.0  11.5 - 15.5  % Final  . Iron 08/20/2013 123  42 - 145 ug/dL Final  . Transferrin 08/20/2013 229.1  212.0 - 360.0 mg/dL Final  . Saturation Ratios 08/20/2013 38.3  20.0 - 50.0 % Final      Examination:   BP 128/54  Pulse 78  Temp(Src) 98.1 F (36.7 C)  Resp 16  Ht 5' 5.5" (1.664 m)  Wt 178 lb (80.74 kg)  BMI 29.16 kg/m2  SpO2 95%  Body mass index is 29.16 kg/(m^2).   Heart sounds normal, no S3, S4 or murmurs Lungs clear No lower leg edema present  Assesment/PLAN:   1. DIABETES: Her blood sugars are overall relatively well controlled although fasting readings appear to be higher again. A1c is reasonable range at 6.7. Also she has lost a little weight 2. 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 Since she has started to turn this may be a contributing factor and will try to stop this and have her followup potassium level in 2 weeks 3. History of mild hypertension: Blood pressure is fairly good although she is taking to turn. She is taking this primarily for her nephropathy 4. CKD and nephropathy: Her creatinine has been variable but relatively increased for no apparent reason 5. HYPOTHYROIDISM: Her TSH is now relatively low even though she is not changing her regimen and no change in her weight. Will need to decrease her dose to 125 mcg. Since she has a relatively long prescription on hand she will reduce the regimen to 6-1/2 tablets a week 6. History of recurrent UTI: Will check urine again, currently asymptomatic 7. Possible fluid overload/mild CHF. She is doing well with low dose every other day Lasix and will continue 8. Hyperlipidemia: Not clear why her triglycerides are higher despite taking fenofibrate acid  Karianne Nogueira  08/23/2013

## 2013-08-23 NOTE — Patient Instructions (Signed)
Levemir 22 units  Synthroid 125ug daily next Rx; with the 137 dose take 1/2 tab once weekly  Stop Tekturna  No high potassium foods

## 2013-08-28 DIAGNOSIS — M48061 Spinal stenosis, lumbar region without neurogenic claudication: Secondary | ICD-10-CM | POA: Diagnosis not present

## 2013-08-28 DIAGNOSIS — M47817 Spondylosis without myelopathy or radiculopathy, lumbosacral region: Secondary | ICD-10-CM | POA: Diagnosis not present

## 2013-08-30 ENCOUNTER — Telehealth: Payer: Self-pay | Admitting: *Deleted

## 2013-08-30 NOTE — Telephone Encounter (Signed)
[name] [name] requesting clearance for procedure and to hold plavix prior to a lumbar epidural injection

## 2013-08-31 NOTE — Telephone Encounter (Signed)
Okay to hold Plavix for her lumbar epidural injection

## 2013-09-03 ENCOUNTER — Telehealth: Payer: Self-pay | Admitting: Cardiovascular Disease

## 2013-09-03 NOTE — Telephone Encounter (Signed)
Informed Stephanie per [place] the clearance has already been sent. Colletta [place] confirmed receipt and informs me the patient has already been scheduled.

## 2013-09-03 NOTE — Telephone Encounter (Signed)
Yes, the form has been faxed back

## 2013-09-03 NOTE — Telephone Encounter (Signed)
[name] [name] I se a note where Dr. Gwenlyn Found has okayed this. Have you already sent a note over to them?

## 2013-09-03 NOTE — Telephone Encounter (Signed)
Form filled out and faxed back to [facility] authorizing Plavix to be held.

## 2013-09-03 NOTE — Telephone Encounter (Signed)
She wants to know if you received a fax on 08-28-13 concerning stopping her Plavix. We need this asap. Need to be off her Plavix for seven days before injection.

## 2013-09-12 ENCOUNTER — Telehealth: Payer: Self-pay | Admitting: Endocrinology

## 2013-09-12 ENCOUNTER — Other Ambulatory Visit: Payer: Self-pay | Admitting: *Deleted

## 2013-09-12 MED ORDER — HYDROCODONE-ACETAMINOPHEN 5-325 MG PO TABS: 1.0000 | ORAL_TABLET | Freq: Three times a day (TID) | ORAL | Status: DC | PRN

## 2013-09-12 NOTE — Telephone Encounter (Signed)
Can we have the hydrocodone rx ready for her at 915 tomorrow that is her lab appt

## 2013-09-12 NOTE — Telephone Encounter (Signed)
It's ready and up front waiting for her

## 2013-09-13 ENCOUNTER — Other Ambulatory Visit (INDEPENDENT_AMBULATORY_CARE_PROVIDER_SITE_OTHER): Payer: Medicare Other

## 2013-09-13 DIAGNOSIS — E1129 Type 2 diabetes mellitus with other diabetic kidney complication: Secondary | ICD-10-CM | POA: Diagnosis not present

## 2013-09-13 DIAGNOSIS — E1165 Type 2 diabetes mellitus with hyperglycemia: Secondary | ICD-10-CM

## 2013-09-13 DIAGNOSIS — E785 Hyperlipidemia, unspecified: Secondary | ICD-10-CM | POA: Diagnosis not present

## 2013-09-13 DIAGNOSIS — E875 Hyperkalemia: Secondary | ICD-10-CM

## 2013-09-13 LAB — RENAL FUNCTION PANEL
Albumin: 3.3 g/dL — ABNORMAL LOW (ref 3.5–5.2)
BUN: 43 mg/dL — AB (ref 6–23)
CHLORIDE: 100 meq/L (ref 96–112)
CO2: 23 mEq/L (ref 19–32)
Calcium: 9.7 mg/dL (ref 8.4–10.5)
Creatinine, Ser: 1.7 mg/dL — ABNORMAL HIGH (ref 0.4–1.2)
GFR: 32.46 mL/min — AB (ref >60.00)
GLUCOSE: 186 mg/dL — AB (ref 70–99)
PHOSPHORUS: 3.6 mg/dL (ref 2.3–4.6)
POTASSIUM: 4.9 meq/L (ref 3.5–5.1)
Sodium: 134 mEq/L — ABNORMAL LOW (ref 135–145)

## 2013-09-13 LAB — HEMOGLOBIN A1C: Hgb A1c MFr Bld: 6.7 % — ABNORMAL HIGH (ref 4.6–6.5)

## 2013-09-13 LAB — COMPREHENSIVE METABOLIC PANEL
ALT: 14 U/L (ref 0–35)
AST: 22 U/L (ref 0–37)
Albumin: 3.3 g/dL — ABNORMAL LOW (ref 3.5–5.2)
Alkaline Phosphatase: 54 U/L (ref 39–117)
BUN: 43 mg/dL — ABNORMAL HIGH (ref 6–23)
CHLORIDE: 100 meq/L (ref 96–112)
CO2: 23 mEq/L (ref 19–32)
Calcium: 9.7 mg/dL (ref 8.4–10.5)
Creatinine, Ser: 1.7 mg/dL — ABNORMAL HIGH (ref 0.4–1.2)
GFR: 32.46 mL/min — ABNORMAL LOW (ref >60.00)
Glucose, Bld: 186 mg/dL — ABNORMAL HIGH (ref 70–99)
Potassium: 4.9 mEq/L (ref 3.5–5.1)
Sodium: 134 mEq/L — ABNORMAL LOW (ref 135–145)
Total Bilirubin: 0.5 mg/dL (ref 0.2–1.2)
Total Protein: 6.4 g/dL (ref 6.0–8.3)

## 2013-09-13 LAB — LIPID PANEL
CHOL/HDL RATIO: 4
CHOLESTEROL: 157 mg/dL (ref 0–200)
HDL: 35.4 mg/dL — AB (ref >39.00)
LDL CALC: 49 mg/dL (ref 0–99)
NonHDL: 121.6
Triglycerides: 362 mg/dL — ABNORMAL HIGH (ref 0.0–149.0)
VLDL: 72.4 mg/dL — AB (ref 0.0–40.0)

## 2013-09-14 ENCOUNTER — Other Ambulatory Visit: Payer: Self-pay | Admitting: *Deleted

## 2013-09-14 MED ORDER — VITAMIN D (ERGOCALCIFEROL) 1.25 MG (50000 UNIT) PO CAPS: 50000.0000 [IU] | ORAL_CAPSULE | ORAL | Status: DC

## 2013-09-19 DIAGNOSIS — M5430 Sciatica, unspecified side: Secondary | ICD-10-CM | POA: Diagnosis not present

## 2013-09-19 DIAGNOSIS — M47817 Spondylosis without myelopathy or radiculopathy, lumbosacral region: Secondary | ICD-10-CM | POA: Diagnosis not present

## 2013-09-19 DIAGNOSIS — M48061 Spinal stenosis, lumbar region without neurogenic claudication: Secondary | ICD-10-CM | POA: Diagnosis not present

## 2013-09-19 NOTE — Progress Notes (Signed)
Quick Note:  Please let patient know that the potassium result is normal and no further action needed ______ 

## 2013-10-04 DIAGNOSIS — M47817 Spondylosis without myelopathy or radiculopathy, lumbosacral region: Secondary | ICD-10-CM | POA: Diagnosis not present

## 2013-10-09 ENCOUNTER — Other Ambulatory Visit: Payer: Self-pay | Admitting: *Deleted

## 2013-10-09 DIAGNOSIS — M545 Low back pain, unspecified: Secondary | ICD-10-CM | POA: Diagnosis not present

## 2013-10-09 DIAGNOSIS — M48061 Spinal stenosis, lumbar region without neurogenic claudication: Secondary | ICD-10-CM | POA: Diagnosis not present

## 2013-10-09 MED ORDER — INSULIN DETEMIR 100 UNIT/ML FLEXPEN: 20.0000 [IU] | PEN_INJECTOR | Freq: Every day | SUBCUTANEOUS | Status: DC

## 2013-10-12 DIAGNOSIS — M545 Low back pain, unspecified: Secondary | ICD-10-CM | POA: Diagnosis not present

## 2013-10-12 DIAGNOSIS — M48061 Spinal stenosis, lumbar region without neurogenic claudication: Secondary | ICD-10-CM | POA: Diagnosis not present

## 2013-10-15 DIAGNOSIS — M545 Low back pain, unspecified: Secondary | ICD-10-CM | POA: Diagnosis not present

## 2013-10-15 DIAGNOSIS — M48061 Spinal stenosis, lumbar region without neurogenic claudication: Secondary | ICD-10-CM | POA: Diagnosis not present

## 2013-10-19 ENCOUNTER — Other Ambulatory Visit: Payer: Self-pay | Admitting: *Deleted

## 2013-10-19 ENCOUNTER — Telehealth: Payer: Self-pay | Admitting: Endocrinology

## 2013-10-19 DIAGNOSIS — M48061 Spinal stenosis, lumbar region without neurogenic claudication: Secondary | ICD-10-CM | POA: Diagnosis not present

## 2013-10-19 DIAGNOSIS — M545 Low back pain, unspecified: Secondary | ICD-10-CM | POA: Diagnosis not present

## 2013-10-19 MED ORDER — HYDROCODONE-ACETAMINOPHEN 5-325 MG PO TABS: 1.0000 | ORAL_TABLET | Freq: Three times a day (TID) | ORAL | Status: DC | PRN

## 2013-10-19 NOTE — Telephone Encounter (Signed)
Patient need refill of Hydrocodone 5-325 mg, she need it today, she want pick it up before her lab appoint Monday.

## 2013-10-22 ENCOUNTER — Other Ambulatory Visit: Payer: Self-pay | Admitting: *Deleted

## 2013-10-22 ENCOUNTER — Other Ambulatory Visit (INDEPENDENT_AMBULATORY_CARE_PROVIDER_SITE_OTHER): Payer: Medicare Other

## 2013-10-22 DIAGNOSIS — E039 Hypothyroidism, unspecified: Secondary | ICD-10-CM

## 2013-10-22 LAB — BASIC METABOLIC PANEL
BUN: 34 mg/dL — AB (ref 6–23)
CALCIUM: 9.5 mg/dL (ref 8.4–10.5)
CO2: 25 mEq/L (ref 19–32)
Chloride: 104 mEq/L (ref 96–112)
Creatinine, Ser: 1.7 mg/dL — ABNORMAL HIGH (ref 0.4–1.2)
GFR: 31.57 mL/min — AB (ref >60.00)
GLUCOSE: 190 mg/dL — AB (ref 70–99)
POTASSIUM: 4.4 meq/L (ref 3.5–5.1)
Sodium: 140 mEq/L (ref 135–145)

## 2013-10-22 LAB — TSH: TSH: 0.22 u[IU]/mL — ABNORMAL LOW (ref 0.35–4.50)

## 2013-10-23 DIAGNOSIS — M545 Low back pain, unspecified: Secondary | ICD-10-CM | POA: Diagnosis not present

## 2013-10-23 DIAGNOSIS — M48061 Spinal stenosis, lumbar region without neurogenic claudication: Secondary | ICD-10-CM | POA: Diagnosis not present

## 2013-10-25 ENCOUNTER — Ambulatory Visit: Payer: Medicare Other | Admitting: Endocrinology

## 2013-10-25 DIAGNOSIS — M545 Low back pain, unspecified: Secondary | ICD-10-CM | POA: Diagnosis not present

## 2013-10-25 DIAGNOSIS — M48061 Spinal stenosis, lumbar region without neurogenic claudication: Secondary | ICD-10-CM | POA: Diagnosis not present

## 2013-10-29 ENCOUNTER — Encounter: Payer: Self-pay | Admitting: Endocrinology

## 2013-10-30 DIAGNOSIS — M48061 Spinal stenosis, lumbar region without neurogenic claudication: Secondary | ICD-10-CM | POA: Diagnosis not present

## 2013-10-30 DIAGNOSIS — M545 Low back pain, unspecified: Secondary | ICD-10-CM | POA: Diagnosis not present

## 2013-11-01 ENCOUNTER — Encounter: Payer: Self-pay | Admitting: Endocrinology

## 2013-11-01 ENCOUNTER — Ambulatory Visit (INDEPENDENT_AMBULATORY_CARE_PROVIDER_SITE_OTHER): Payer: Medicare Other | Admitting: Endocrinology

## 2013-11-01 ENCOUNTER — Other Ambulatory Visit: Payer: Self-pay | Admitting: *Deleted

## 2013-11-01 VITALS — BP 122/60 | HR 72 | Temp 98.6°F | Resp 16 | Ht 65.5 in | Wt 175.8 lb

## 2013-11-01 DIAGNOSIS — E1129 Type 2 diabetes mellitus with other diabetic kidney complication: Secondary | ICD-10-CM

## 2013-11-01 DIAGNOSIS — N183 Chronic kidney disease, stage 3 unspecified: Secondary | ICD-10-CM

## 2013-11-01 DIAGNOSIS — E785 Hyperlipidemia, unspecified: Secondary | ICD-10-CM

## 2013-11-01 DIAGNOSIS — R0989 Other specified symptoms and signs involving the circulatory and respiratory systems: Secondary | ICD-10-CM

## 2013-11-01 DIAGNOSIS — R0609 Other forms of dyspnea: Secondary | ICD-10-CM | POA: Diagnosis not present

## 2013-11-01 DIAGNOSIS — E1165 Type 2 diabetes mellitus with hyperglycemia: Secondary | ICD-10-CM | POA: Diagnosis not present

## 2013-11-01 DIAGNOSIS — I6529 Occlusion and stenosis of unspecified carotid artery: Secondary | ICD-10-CM

## 2013-11-01 DIAGNOSIS — E039 Hypothyroidism, unspecified: Secondary | ICD-10-CM

## 2013-11-01 DIAGNOSIS — R0600 Dyspnea, unspecified: Secondary | ICD-10-CM

## 2013-11-01 MED ORDER — SYNTHROID 112 MCG PO TABS: 112.0000 ug | ORAL_TABLET | Freq: Every day | ORAL | Status: DC

## 2013-11-01 NOTE — Patient Instructions (Signed)
Please check blood sugars at least half the time about 2 hours after any meal including supper and 3 times per week on waking up. Please bring blood sugar monitor to each visit  Synthroid, 1 daily, none on Fridays

## 2013-11-01 NOTE — Progress Notes (Signed)
Patient ID: [name] [name], female   DOB: June 20, 1940, 73 y.o.   MRN: [id number]   Reason for Appointment: Followup of various problems  History of Present Illness   1. Type 2 diabetes mellitus, date of diagnosis: 1986.   The patient is seen today in follow up of long-standing Type 2 diabetes  This has been treated in the last couple of years with low dose basal bolus insulin regimen. She has not been taking  any oral hypoglycemic drugs for some time The lowest previous HbgA1c  was 6.3, on 07/28/12.   Her blood sugars are overall somewhat higher in the mornings usually when on an average the same as before. She is also checking her sugar rather than 2 or hours after her main meal is at lunchtime She has an occasional very high blood sugars which probably related to change in diet although she is very consistent with taking insulin before eating She thinks she is usually eating small portions A1c was last checked in 09/2013 She is not able to exercise  because of low back pain but her weight has improved further  The insulin regimen is described as Levemir 22 units hs, Novolog 8 ac twice a day and she is compliant with her regimen.   Side effects from medications: Diarrhea from metformin and nausea and vomiting from GLP-1 drugs  Monitors blood glucose: 1.1 times a day  Glucometer: One Touch.  Blood Glucose readings:   PREMEAL Breakfast Lunch Dinner Bedtime Overall  Glucose range: 105-171   120-297     Mean/median:  142    150    144    Hypoglycemia frequency: None recently.   Meals: she is usually eating low fat meals but is sometimes eating out at lunch; meals usually at 11 AM,and Light supper at 5 pm.  Calorie intake: Usually controlled  Physical activity: exercise: a little on exercise bike Dietician visit: Most recent:, 5/13.   Wt Readings from Last 3 Encounters:  11/01/13 175 lb 12.8 oz (79.742 kg)  08/23/13 178 lb (80.74 kg)  07/11/13 182 lb (82.555 kg)    Lab Results   Component Value Date   HGBA1C 6.7* 09/13/2013   HGBA1C 6.7* 08/20/2013   HGBA1C 7.0* 02/21/2013   Lab Results  Component Value Date   MICROALBUR 29.4* 07/06/2013   LDLCALC 49 09/13/2013   CREATININE 1.7* 10/22/2013      2. Dyspnea: Started in 04/06/13 . Chest x-ray showed only mild vascular congestion and she was subjectively improved with stopping Florinef and taking Lasix. Since dyspnea had recurred after stopping Lasix she continues to be on this every other day with fairly good relief of dyspnea and edema. Also any min take albuterol with some relief.     3. CKD: Her creatinine is persistently higher even after stopping low dose Tekturna for nephropathy  Lab Results  Component Value Date   CREATININE 1.7* 10/22/2013   HYPERKALEMIA: Because of persistent mild increase in potassium she had been on Florinef 3 times a week. This was discontinued because of possible CHF and dyspnea previously. Potassium is normal high. She is usually not eating fruits except watermelon  4. HYPOTHYROIDISM: She has had long-standing primary hypothyroidism and although she is requiring relatively lower doses for quite some time; she is now taking 6 1/2 tablets per week as directed and her TSH is again  low Currently has a large supply of the brand name 112 mcg  Lab Results  Component Value Date   FREET4  1.42 08/20/2013   FREET4 1.36 01/04/2013   FREET4 1.47 10/02/2012   TSH 0.22* 10/22/2013   TSH 0.14* 08/20/2013   TSH 0.64 05/02/2013         Medication List       This list is accurate as of: 11/01/13  3:23 PM.  Always use your most recent med list.               albuterol 108 (90 BASE) MCG/ACT inhaler  Commonly known as:  PROVENTIL HFA;VENTOLIN HFA  Inhale 2 puffs into the lungs every 6 (six) hours as needed for wheezing.     aliskiren 150 MG tablet  Commonly known as:  TEKTURNA  Take 1 tablet (150 mg total) by mouth daily.     aspirin 81 MG chewable tablet  Chew 81 mg by mouth at bedtime.      calcium-vitamin D 500-200 MG-UNIT per tablet  Commonly known as:  OSCAL WITH D  Take 1 tablet by mouth every morning.     Choline Fenofibrate 135 MG capsule  Commonly known as:  TRILIPIX  Take 1 capsule (135 mg total) by mouth at bedtime.     cilostazol 100 MG tablet  Commonly known as:  PLETAL  Take 100 mg by mouth 2 (two) times daily.     clopidogrel 75 MG tablet  Commonly known as:  PLAVIX  Take 1 tablet (75 mg total) by mouth every morning.     ferrous sulfate 325 (65 FE) MG tablet  Take 325 mg by mouth at bedtime.     furosemide 20 MG tablet  Commonly known as:  LASIX  Take 2 tablets the first day, then one tablet daily     glucose blood test strip  TEST AS DIRECTED 3 TIMES DAILY     HYDROcodone-acetaminophen 5-325 MG per tablet  Commonly known as:  NORCO/VICODIN  Take 1 tablet by mouth 3 (three) times daily as needed.     insulin aspart 100 UNIT/ML FlexPen  Commonly known as:  NOVOLOG  Inject 8 Units into the skin 3 (three) times daily with meals. Usually 2 times daily     Insulin Detemir 100 UNIT/ML Pen  Commonly known as:  LEVEMIR  Inject 20 Units into the skin daily at 10 pm.     Insulin Pen Needle 32G X 4 MM Misc  Commonly known as:  BD PEN NEEDLE NANO U/F  Use 3 needles per day     levothyroxine 137 MCG tablet  Commonly known as:  SYNTHROID, LEVOTHROID  Take 1 tablet (137 mcg total) by mouth daily before breakfast.     magnesium oxide 400 MG tablet  Commonly known as:  MAG-OX  Take 2 tablets (800 mg total) by mouth at bedtime.     metoprolol succinate 25 MG 24 hr tablet  Commonly known as:  TOPROL-XL  Take 0.5 tablets (12.5 mg total) by mouth every morning.     omeprazole 20 MG capsule  Commonly known as:  PRILOSEC  Take 1 capsule (20 mg total) by mouth daily.     oxybutynin 5 MG tablet  Commonly known as:  DITROPAN  Take 5 mg by mouth at bedtime.     rosuvastatin 40 MG tablet  Commonly known as:  CRESTOR  Take 1 tablet daily     vitamin  B-12 1000 MCG tablet  Commonly known as:  CYANOCOBALAMIN  Take 1,000 mcg by mouth every morning.     vitamin C 500 MG tablet  Commonly  known as:  ASCORBIC ACID  Take 500 mg by mouth every morning.     Vitamin D (Ergocalciferol) 50000 UNITS Caps capsule  Commonly known as:  DRISDOL  Take 1 capsule (50,000 Units total) by mouth every 7 (seven) days. On thursdays        Allergies: No Known Allergies  Past Medical History  Diagnosis Date  . Diabetes mellitus without complication     type 2  . CHF (congestive heart failure)   . Coronary artery disease   . AAA (abdominal aortic aneurysm) 03/09/2012  . PVD (peripheral vascular disease)   . Carotid artery stenosis     carotid doppler 06/2012 - Right CCA/Bulb/ICA with chronic occlusion; L vertebral artery with abnormal blood flow; L Bulb/Prox ICA  s/p endarterectomy with mild fibrous plaque, 50% diameter reduction  . Hypertension   . Hyperlipidemia   . Hypothyroidism   . History of nuclear stress test 11/24/2011    lexiscan; nonischemic, low risk   . PAD (peripheral artery disease)     09/2010 LEAs - R ABI of 0.45, occluded fem-pop bypass graft, L ABI of 0.59 with occluded SFA; severe arterial insuff  . Ischemic cardiomyopathy     EF 03/2010 50-55%    Past Surgical History  Procedure Laterality Date  . Coronary artery bypass graft  1997    x6; internal mammary to LAD, SVG to ramus #1 & #2, SVG to OM, SVG to PDA,   . Abdominal hysterectomy  1986  . Tumor removal    . Coronary stent placement    . Femoral artery repair    . Cystoscopy w/ ureteral stent placement  03/10/2012    Procedure: CYSTOSCOPY WITH RETROGRADE PYELOGRAM/URETERAL STENT PLACEMENT;  Surgeon: Hanley Ben, MD;  Location: [facility];  Service: Urology;  Laterality: Left;  . Carotid endarterectomy Bilateral   . Aortobifemoral bypass grafting  1997    with R fem-pop  . Cardiac catheterization  12/2009    occluded vein to OM & ramus branches, patent vein to PDA, patent  LIMA to LAD (Dr. [name], [facility]) - later had thrombectomy of R fem-pop bypass ad R common femoral & profunda femoris artery (Dr. Oneida Alar)  . Transthoracic echocardiogram  03/25/2010    EF 99991111, LV systolic function low normal with mild inferoseptal hypocontractility; LA mildly dilated; mod MR; mild TR, RV systolic pressure elevated, mild pulm HTN; AV mildly sclerotic; mild pulm valve regurg; aortic root sclerosis/calcif     Family History  Problem Relation Age of Onset  . Congestive Heart Failure Mother   . Diabetes Mother   . Stroke Father   . Cancer Maternal Aunt     Breast cancer    Social History:  reports that she quit smoking about 4 years ago. She has never used smokeless tobacco. She reports that she does not drink alcohol or use illicit drugs.  Review of Systems -  Cardiovascular ROS:  positive for - CAD and PVD, no recent symptoms  Hyperlipidemia: Has history of high LDL and triglycerides/low HDL  She is compliant with her current doses of 20 mg Crestor and Trilipix Tends to have high triglycerides despite taking both drugs No recurrence of liver dysfunction recently  Lab Results  Component Value Date   CHOL 157 09/13/2013   HDL 35.40* 09/13/2013   LDLCALC 49 09/13/2013   LDLDIRECT 98.2 08/20/2013   TRIG 362.0* 09/13/2013   CHOLHDL 4 09/13/2013    Iron Deficiency anemia:  Has had  anemia with  persistently low  hemoglobin and also iron deficiency. Most recent hemoglobin was near-normal Previous history: In 8/13 she had low iron saturation and was 19 in 2/14. Also taking B12.  With iron infusion her hemoglobin was improved  Hemoccult was negative in 9/13.   Lab Results  Component Value Date   WBC 5.2 08/20/2013   HGB 11.8* 08/20/2013   HCT 35.4* 08/20/2013   MCV 89.3 08/20/2013   PLT 224.0 08/20/2013    Vitamin D deficiency: She has been on supplements and taking her weekly dose consistently  Recurrent UTI: currently asymptomatic   LABS:  No visits with results within 1  Week(s) from this visit. Latest known visit with results is:  Appointment on 10/22/2013  Component Date Value Ref Range Status  . TSH 10/22/2013 0.22* 0.35 - 4.50 uIU/mL Final  . Sodium 10/22/2013 140  135 - 145 mEq/L Final  . Potassium 10/22/2013 4.4  3.5 - 5.1 mEq/L Final  . Chloride 10/22/2013 104  96 - 112 mEq/L Final  . CO2 10/22/2013 25  19 - 32 mEq/L Final  . Glucose, Bld 10/22/2013 190* 70 - 99 mg/dL Final  . BUN 10/22/2013 34* 6 - 23 mg/dL Final  . Creatinine, Ser 10/22/2013 1.7* 0.4 - 1.2 mg/dL Final  . Calcium 10/22/2013 9.5  8.4 - 10.5 mg/dL Final  . GFR 10/22/2013 31.57* >60.00 mL/min Final      Examination:   BP 122/60  Pulse 72  Temp(Src) 98.6 F (37 C)  Resp 16  Ht 5' 5.5" (1.664 m)  Wt 175 lb 12.8 oz (79.742 kg)  BMI 28.80 kg/m2  SpO2 94%  Body mass index is 28.8 kg/(m^2).   Heart sounds normal  Lungs clear to auscultation bilaterally No pedal edema present  Assesment/PLAN:   1. DIABETES: Her blood sugars are overall somewhat high  although fasting readings appear to be higher on some days. A1c in 7/15 was at a reasonable range at 6.7. Also she has lost a little weight. She will try to check more readings after meals before adjusting her NovoLog  2. History of mild hypertension: Blood pressure is fairly good although she is taking to turn. She is taking this primarily for her nephropathy 3. CKD and nephropathy: Her creatinine has been variable but persistently increased for no apparent reason, likely to be nephrosclerosis.  Hyperkalemia: Stable with stopping Tekturna 4. HYPOTHYROIDISM: Her TSH is now relatively low even though she is on the brand name  regimen and no marked change in her weight. Will need to decrease her dose to 88 mcg. Since she has several tablets from her 90 day prescription prescription on hand she will reduce the regimen to 6 tablets a week 5. History of recurrent UTI: currently asymptomatic 6. Possible fluid overload/mild CHF. She is  doing well with low dose every other day Lasix and will continue 7. Hyperlipidemia: needs follow on her next visit  Patient Instructions  Please check blood sugars at least half the time about 2 hours after any meal including supper and 3 times per week on waking up. Please bring blood sugar monitor to each visit  Synthroid, 1 daily, none on Fridays   Counseling time over 50% of today's 25 minute visit   [facility]  11/01/2013

## 2013-11-02 DIAGNOSIS — M48061 Spinal stenosis, lumbar region without neurogenic claudication: Secondary | ICD-10-CM | POA: Diagnosis not present

## 2013-11-02 DIAGNOSIS — M545 Low back pain, unspecified: Secondary | ICD-10-CM | POA: Diagnosis not present

## 2013-11-05 ENCOUNTER — Other Ambulatory Visit: Payer: Self-pay | Admitting: *Deleted

## 2013-11-05 ENCOUNTER — Telehealth: Payer: Self-pay | Admitting: *Deleted

## 2013-11-05 DIAGNOSIS — M48061 Spinal stenosis, lumbar region without neurogenic claudication: Secondary | ICD-10-CM | POA: Diagnosis not present

## 2013-11-05 MED ORDER — LEVOTHYROXINE SODIUM 112 MCG PO TABS: 112.0000 ug | ORAL_TABLET | Freq: Every day | ORAL | Status: DC

## 2013-11-05 NOTE — Telephone Encounter (Signed)
Patient ask you to call her and let her know if you got her prescription request.

## 2013-11-05 NOTE — Telephone Encounter (Signed)
Which medication ?

## 2013-11-05 NOTE — Telephone Encounter (Signed)
Okay to send in rx for generic per patient request?

## 2013-11-05 NOTE — Telephone Encounter (Signed)
Patient stated that she need generic brand of Synthroid 112 mg, please call it into the pharmacy.

## 2013-11-05 NOTE — Telephone Encounter (Signed)
Prefer Brand name

## 2013-11-05 NOTE — Telephone Encounter (Signed)
synthroid °

## 2013-11-08 ENCOUNTER — Telehealth: Payer: Self-pay | Admitting: Cardiovascular Disease

## 2013-11-08 NOTE — Telephone Encounter (Signed)
She wants to know if you receive fax, so she can stop her Plavix. If so please fax it back [phone].

## 2013-11-08 NOTE — Telephone Encounter (Signed)
RN informed KATE  FORM IS PRESENT WILL GIVE TO DR BERRY -WHEN RETURN TO OFFICE

## 2013-11-09 ENCOUNTER — Other Ambulatory Visit: Payer: Self-pay | Admitting: *Deleted

## 2013-11-09 ENCOUNTER — Telehealth: Payer: Self-pay | Admitting: Endocrinology

## 2013-11-09 MED ORDER — ATORVASTATIN CALCIUM 80 MG PO TABS: 80.0000 mg | ORAL_TABLET | Freq: Every day | ORAL | Status: DC

## 2013-11-09 NOTE — Telephone Encounter (Signed)
Patient states she called her mail order pharmacy to see how much her crestor was going to cost to refill, she said it was going to be $264.  She wants to know if you could let her do a generic instead?

## 2013-11-09 NOTE — Telephone Encounter (Signed)
Patient ask if you could call her before 5:00.

## 2013-11-09 NOTE — Telephone Encounter (Signed)
She can change to Lipitor 80 mg daily

## 2013-11-09 NOTE — Telephone Encounter (Signed)
Patient would like a call back did not specify why

## 2013-11-09 NOTE — Telephone Encounter (Signed)
Noted, left message on patients vm, rx sent 

## 2013-11-09 NOTE — Telephone Encounter (Signed)
Dr Gwenlyn Found reviewed chart and signed form giving permission to hold Plavix prior to injection. Form faxed back to [employer]

## 2013-11-13 DIAGNOSIS — H409 Unspecified glaucoma: Secondary | ICD-10-CM | POA: Diagnosis not present

## 2013-11-13 DIAGNOSIS — E119 Type 2 diabetes mellitus without complications: Secondary | ICD-10-CM | POA: Diagnosis not present

## 2013-11-13 DIAGNOSIS — H4011X0 Primary open-angle glaucoma, stage unspecified: Secondary | ICD-10-CM | POA: Diagnosis not present

## 2013-11-16 ENCOUNTER — Other Ambulatory Visit: Payer: Self-pay | Admitting: *Deleted

## 2013-11-16 ENCOUNTER — Telehealth: Payer: Self-pay | Admitting: Endocrinology

## 2013-11-16 MED ORDER — MAGNESIUM OXIDE 400 MG PO TABS: 800.0000 mg | ORAL_TABLET | Freq: Every day | ORAL | Status: DC

## 2013-11-16 MED ORDER — INSULIN PEN NEEDLE 32G X 4 MM MISC: Status: DC

## 2013-11-16 NOTE — Telephone Encounter (Signed)
Patient would like her B-D Ultra fine nano needles sent to [facility] 90 day supply    Thank you

## 2013-11-16 NOTE — Telephone Encounter (Signed)
rx sent

## 2013-12-06 DIAGNOSIS — M4806 Spinal stenosis, lumbar region: Secondary | ICD-10-CM | POA: Diagnosis not present

## 2013-12-07 ENCOUNTER — Telehealth: Payer: Self-pay | Admitting: Endocrinology

## 2013-12-07 ENCOUNTER — Other Ambulatory Visit: Payer: Self-pay | Admitting: *Deleted

## 2013-12-07 MED ORDER — HYDROCODONE-ACETAMINOPHEN 5-325 MG PO TABS: 1.0000 | ORAL_TABLET | Freq: Three times a day (TID) | ORAL | Status: DC | PRN

## 2013-12-07 NOTE — Telephone Encounter (Signed)
Pt needs refill on hydrocodone.  °

## 2013-12-20 DIAGNOSIS — N1330 Unspecified hydronephrosis: Secondary | ICD-10-CM | POA: Diagnosis not present

## 2013-12-20 DIAGNOSIS — N390 Urinary tract infection, site not specified: Secondary | ICD-10-CM | POA: Diagnosis not present

## 2013-12-27 DIAGNOSIS — M4806 Spinal stenosis, lumbar region: Secondary | ICD-10-CM | POA: Diagnosis not present

## 2014-01-08 ENCOUNTER — Ambulatory Visit (INDEPENDENT_AMBULATORY_CARE_PROVIDER_SITE_OTHER): Payer: Medicare Other | Admitting: Cardiovascular Disease

## 2014-01-08 ENCOUNTER — Encounter: Payer: Self-pay | Admitting: Cardiovascular Disease

## 2014-01-08 VITALS — BP 100/64 | HR 80 | Ht 66.5 in | Wt 177.0 lb

## 2014-01-08 DIAGNOSIS — I714 Abdominal aortic aneurysm, without rupture, unspecified: Secondary | ICD-10-CM

## 2014-01-08 DIAGNOSIS — I739 Peripheral vascular disease, unspecified: Secondary | ICD-10-CM

## 2014-01-08 DIAGNOSIS — I10 Essential (primary) hypertension: Secondary | ICD-10-CM | POA: Diagnosis not present

## 2014-01-08 DIAGNOSIS — E785 Hyperlipidemia, unspecified: Secondary | ICD-10-CM

## 2014-01-08 DIAGNOSIS — I2510 Atherosclerotic heart disease of native coronary artery without angina pectoris: Secondary | ICD-10-CM | POA: Diagnosis not present

## 2014-01-08 DIAGNOSIS — I779 Disorder of arteries and arterioles, unspecified: Secondary | ICD-10-CM

## 2014-01-08 DIAGNOSIS — I2583 Coronary atherosclerosis due to lipid rich plaque: Principal | ICD-10-CM

## 2014-01-08 NOTE — Assessment & Plan Note (Signed)
Status post aortobifemoral bypass grafting

## 2014-01-08 NOTE — Progress Notes (Signed)
01/08/2014 [name] [name]   1940/03/28  [id number]  Primary Physician [name], MD Primary Cardiologist: [name] MD [name]   HPI:  The patient is a 73 year old, mildly overweight, divorced Caucasian female, mother of 1 child who I last saw in the office 12 months ago. She has a history of CAD status post coronary artery bypass grafting x6 in 1997. She has had bilateral carotid endarterectomies, aortobifemoral bypass grafting and right fem-pop bypass grafting with lifestyle-limiting claudication. Cath performed by Dr. [name] December 09, 2009, revealed an occluded vein to an OM and ramus branches with a patent vein to a PDA and a patent LIMA to the LAD. Her EF was 40% at that time. She quit smoking November 2011. She had an echo performed March 25, 2010, that showed normal LV systolic function and a Myoview performed November 24, 2011, which was nonischemic. She denies chest pain or shortness of breath. Her last lower extremity Dopplers performed in our office October 05, 2011, revealed a right ABI of 0.45 with an occluded fem-pop bypass graft and a left ABI of 0.59 with an occluded left SFA.since I saw her back 7 months ago she's remained clinically stable. She denies chest pain or shortness of breath. Her lower extremity arterial Dopplers show progression of disease on the right but though I do not think she is revascularizable given her comorbidities.   Current Outpatient Prescriptions  Medication Sig Dispense Refill  . albuterol (PROVENTIL HFA;VENTOLIN HFA) 108 (90 BASE) MCG/ACT inhaler Inhale 2 puffs into the lungs every 6 (six) hours as needed for wheezing. 1 Inhaler 5  . aspirin 81 MG chewable tablet Chew 81 mg by mouth at bedtime.     Marland Kitchen atorvastatin (LIPITOR) 80 MG tablet Take 1 tablet (80 mg total) by mouth daily. 90 tablet 1  . calcium-vitamin D (OSCAL WITH D) 500-200 MG-UNIT per tablet Take 1 tablet by mouth every morning.    . Choline Fenofibrate  (TRILIPIX) 135 MG capsule Take 1 capsule (135 mg total) by mouth at bedtime. 90 capsule 1  . cilostazol (PLETAL) 100 MG tablet Take 100 mg by mouth 2 (two) times daily.    . clopidogrel (PLAVIX) 75 MG tablet Take 1 tablet (75 mg total) by mouth every morning. 90 tablet 1  . ferrous sulfate 325 (65 FE) MG tablet Take 325 mg by mouth at bedtime.     . furosemide (LASIX) 20 MG tablet Take 2 tablets the first day, then one tablet daily 31 tablet 3  . glucose blood test strip TEST AS DIRECTED 3 TIMES DAILY 100 each 4  . HYDROcodone-acetaminophen (NORCO/VICODIN) 5-325 MG per tablet Take 1 tablet by mouth 3 (three) times daily as needed. 90 tablet 0  . insulin aspart (NOVOLOG) 100 UNIT/ML FlexPen Inject 8 Units into the skin 3 (three) times daily with meals. Usually 2 times daily    . Insulin Detemir (LEVEMIR) 100 UNIT/ML Pen Inject 20 Units into the skin daily at 10 pm. (Patient taking differently: Inject 22 Units into the skin daily at 10 pm. ) 30 mL 1  . Insulin Pen Needle (BD PEN NEEDLE NANO U/F) 32G X 4 MM MISC Use 3 needles per day 300 each 1  . levothyroxine (SYNTHROID, LEVOTHROID) 112 MCG tablet Take 1 tablet (112 mcg total) by mouth daily. 90 tablet 3  . magnesium oxide (MAG-OX) 400 MG tablet Take 2 tablets (800 mg total) by mouth at bedtime. 180 tablet 1  . metoprolol succinate (TOPROL-XL) 25  MG 24 hr tablet Take 0.5 tablets (12.5 mg total) by mouth every morning. 90 tablet 1  . omeprazole (PRILOSEC) 20 MG capsule Take 1 capsule (20 mg total) by mouth daily. 90 capsule 3  . vitamin B-12 (CYANOCOBALAMIN) 1000 MCG tablet Take 1,000 mcg by mouth every morning.    . vitamin C (ASCORBIC ACID) 500 MG tablet Take 500 mg by mouth every morning.    . Vitamin D, Ergocalciferol, (DRISDOL) 50000 UNITS CAPS capsule Take 1 capsule (50,000 Units total) by mouth every 7 (seven) days. On thursdays 4 capsule 11   No current facility-administered medications for this visit.    No Known Allergies  History    Social History  . Marital Status: Divorced    Spouse Name: N/A    Number of Children: 1  . Years of Education: 12   Occupational History  . Not on file.   Social History Main Topics  . Smoking status: Former Smoker -- 2.00 packs/day for 25 years    Quit date: 06/07/2009  . Smokeless tobacco: Never Used  . Alcohol Use: No  . Drug Use: No  . Sexual Activity: Not on file   Other Topics Concern  . Not on file   Social History Narrative     Review of Systems: General: negative for chills, fever, night sweats or weight changes.  Cardiovascular: negative for chest pain, dyspnea on exertion, edema, orthopnea, palpitations, paroxysmal nocturnal dyspnea or shortness of breath Dermatological: negative for rash Respiratory: negative for cough or wheezing Urologic: negative for hematuria Abdominal: negative for nausea, vomiting, diarrhea, bright red blood per rectum, melena, or hematemesis Neurologic: negative for visual changes, syncope, or dizziness All other systems reviewed and are otherwise negative except as noted above.    Blood pressure 100/64, pulse 80, height 5' 6.5" (1.689 m), weight 177 lb (80.287 kg).  General appearance: alert and no distress Neck: no adenopathy, no carotid bruit, no JVD, supple, symmetrical, trachea midline and thyroid not enlarged, symmetric, no tenderness/mass/nodules Lungs: clear to auscultation bilaterally Heart: regular rate and rhythm, S1, S2 normal, no murmur, click, rub or gallop Extremities: extremities normal, atraumatic, no cyanosis or edema  EKG normal sinus rhythm with evidence of LVH with repolarization changes  ASSESSMENT AND PLAN:   CAD (coronary artery disease) History of CAD status post coronary artery bypass grafting in 1997. She underwent cardiac catheterization by Dr. [name] 12/09/09 revealing an occluded vein graft to an obtuse marginal branch and ramus branch with a patent vein to the PDA and patent LIMA to the LAD. Her EF  was 40% that time which increased to normal by 2-D echo 03/25/10. Her last Myoview stress test performed 11/24/11 was nonischemic. She denies chest pain or shortness of breath.  HTN (hypertension) Controlled on current medications  AAA (abdominal aortic aneurysm) Status post aortobifemoral bypass grafting  Claudication in peripheral vascular disease History of PAD status post right femoropopliteal bypass grafting with duplex evidence of an occluded femoropopliteal bypass graft and ABIs in the .4-.6  range bilaterally. She does complain of lifestyle limiting claudication.  Carotid artery disease History of bilateral carotid endarterectomies with duplex evidence of an occluded right internal carotid with mild to moderate left ICA stenosis.  Hyperlipidemia On statin therapy with recent lipid profile performed 09/13/13 revealing a total cholesterol of 157, LDL of 49 and HDL of 35      [name] MD [facility], [facility] 01/08/2014 1:41 PM

## 2014-01-08 NOTE — Assessment & Plan Note (Signed)
On statin therapy with recent lipid profile performed 09/13/13 revealing a total cholesterol of 157, LDL of 49 and HDL of 35

## 2014-01-08 NOTE — Assessment & Plan Note (Signed)
Controlled on current medications 

## 2014-01-08 NOTE — Assessment & Plan Note (Signed)
History of PAD status post right femoropopliteal bypass grafting with duplex evidence of an occluded femoropopliteal bypass graft and ABIs in the .4-.6  range bilaterally. She does complain of lifestyle limiting claudication.

## 2014-01-08 NOTE — Patient Instructions (Signed)
Dr Berry recommends that you schedule a follow-up appointment in 1 year. You will receive a reminder letter in the mail two months in advance. If you don't receive a letter, please call our office to schedule the follow-up appointment. 

## 2014-01-08 NOTE — Assessment & Plan Note (Signed)
History of bilateral carotid endarterectomies with duplex evidence of an occluded right internal carotid with mild to moderate left ICA stenosis.

## 2014-01-08 NOTE — Assessment & Plan Note (Signed)
History of CAD status post coronary artery bypass grafting in 1997. She underwent cardiac catheterization by Dr. [name] 12/09/09 revealing an occluded vein graft to an obtuse marginal branch and ramus branch with a patent vein to the PDA and patent LIMA to the LAD. Her EF was 40% that time which increased to normal by 2-D echo 03/25/10. Her last Myoview stress test performed 11/24/11 was nonischemic. She denies chest pain or shortness of breath.

## 2014-01-22 ENCOUNTER — Telehealth: Payer: Self-pay | Admitting: Endocrinology

## 2014-01-22 ENCOUNTER — Other Ambulatory Visit: Payer: Self-pay | Admitting: *Deleted

## 2014-01-22 MED ORDER — HYDROCODONE-ACETAMINOPHEN 5-325 MG PO TABS: 1.0000 | ORAL_TABLET | Freq: Three times a day (TID) | ORAL | Status: DC | PRN

## 2014-01-22 NOTE — Telephone Encounter (Signed)
Patient need prescription for hydrocodone 5-325 mg she will come pick it up tommorow

## 2014-01-23 ENCOUNTER — Other Ambulatory Visit (INDEPENDENT_AMBULATORY_CARE_PROVIDER_SITE_OTHER): Payer: Medicare Other

## 2014-01-23 ENCOUNTER — Other Ambulatory Visit: Payer: Medicare Other

## 2014-01-23 DIAGNOSIS — I10 Essential (primary) hypertension: Secondary | ICD-10-CM

## 2014-01-23 DIAGNOSIS — IMO0002 Reserved for concepts with insufficient information to code with codable children: Secondary | ICD-10-CM

## 2014-01-23 DIAGNOSIS — E785 Hyperlipidemia, unspecified: Secondary | ICD-10-CM

## 2014-01-23 DIAGNOSIS — E1165 Type 2 diabetes mellitus with hyperglycemia: Secondary | ICD-10-CM

## 2014-01-23 DIAGNOSIS — E1129 Type 2 diabetes mellitus with other diabetic kidney complication: Secondary | ICD-10-CM | POA: Diagnosis not present

## 2014-01-23 DIAGNOSIS — E039 Hypothyroidism, unspecified: Secondary | ICD-10-CM

## 2014-01-23 LAB — T4, FREE: Free T4: 1.12 ng/dL (ref 0.60–1.60)

## 2014-01-23 LAB — URINALYSIS, ROUTINE W REFLEX MICROSCOPIC
Bilirubin Urine: NEGATIVE
KETONES UR: NEGATIVE
Nitrite: NEGATIVE
SPECIFIC GRAVITY, URINE: 1.015 (ref 1.000–1.030)
Total Protein, Urine: 30 — AB
UROBILINOGEN UA: 0.2 (ref 0.0–1.0)
Urine Glucose: NEGATIVE
pH: 7 (ref 5.0–8.0)

## 2014-01-23 LAB — MICROALBUMIN / CREATININE URINE RATIO
Creatinine,U: 37 mg/dL
Microalb Creat Ratio: 46.5 mg/g — ABNORMAL HIGH (ref 0.0–30.0)
Microalb, Ur: 17.2 mg/dL — ABNORMAL HIGH (ref 0.0–1.9)

## 2014-01-23 LAB — COMPREHENSIVE METABOLIC PANEL
ALT: 23 U/L (ref 0–35)
AST: 30 U/L (ref 0–37)
Albumin: 3.6 g/dL (ref 3.5–5.2)
Alkaline Phosphatase: 69 U/L (ref 39–117)
BUN: 28 mg/dL — ABNORMAL HIGH (ref 6–23)
CHLORIDE: 103 meq/L (ref 96–112)
CO2: 28 meq/L (ref 19–32)
CREATININE: 1.5 mg/dL — AB (ref 0.4–1.2)
Calcium: 9.7 mg/dL (ref 8.4–10.5)
GFR: 36.2 mL/min — AB (ref >60.00)
Glucose, Bld: 145 mg/dL — ABNORMAL HIGH (ref 70–99)
Potassium: 4.4 mEq/L (ref 3.5–5.1)
Sodium: 140 mEq/L (ref 135–145)
TOTAL PROTEIN: 7 g/dL (ref 6.0–8.3)
Total Bilirubin: 0.6 mg/dL (ref 0.2–1.2)

## 2014-01-23 LAB — LIPID PANEL
CHOL/HDL RATIO: 6
Cholesterol: 216 mg/dL — ABNORMAL HIGH (ref 0–200)
HDL: 37.7 mg/dL — AB (ref >39.00)
NonHDL: 178.3
TRIGLYCERIDES: 342 mg/dL — AB (ref 0.0–149.0)
VLDL: 68.4 mg/dL — ABNORMAL HIGH (ref 0.0–40.0)

## 2014-01-23 LAB — TSH: TSH: 7.38 u[IU]/mL — ABNORMAL HIGH (ref 0.35–4.50)

## 2014-01-23 LAB — HEMOGLOBIN A1C: Hgb A1c MFr Bld: 7.2 % — ABNORMAL HIGH (ref 4.6–6.5)

## 2014-01-23 LAB — LDL CHOLESTEROL, DIRECT: Direct LDL: 117.3 mg/dL

## 2014-01-28 ENCOUNTER — Encounter: Payer: Self-pay | Admitting: Endocrinology

## 2014-01-28 ENCOUNTER — Ambulatory Visit (INDEPENDENT_AMBULATORY_CARE_PROVIDER_SITE_OTHER): Payer: Medicare Other | Admitting: Endocrinology

## 2014-01-28 ENCOUNTER — Other Ambulatory Visit: Payer: Self-pay | Admitting: *Deleted

## 2014-01-28 ENCOUNTER — Ambulatory Visit: Payer: Medicare Other | Admitting: Endocrinology

## 2014-01-28 VITALS — BP 143/67 | HR 69 | Temp 98.5°F | Resp 16 | Ht 66.5 in | Wt 177.0 lb

## 2014-01-28 DIAGNOSIS — Z23 Encounter for immunization: Secondary | ICD-10-CM | POA: Diagnosis not present

## 2014-01-28 DIAGNOSIS — N183 Chronic kidney disease, stage 3 unspecified: Secondary | ICD-10-CM

## 2014-01-28 DIAGNOSIS — IMO0002 Reserved for concepts with insufficient information to code with codable children: Secondary | ICD-10-CM

## 2014-01-28 DIAGNOSIS — N390 Urinary tract infection, site not specified: Secondary | ICD-10-CM

## 2014-01-28 DIAGNOSIS — E782 Mixed hyperlipidemia: Secondary | ICD-10-CM

## 2014-01-28 DIAGNOSIS — E1165 Type 2 diabetes mellitus with hyperglycemia: Secondary | ICD-10-CM

## 2014-01-28 DIAGNOSIS — I6529 Occlusion and stenosis of unspecified carotid artery: Secondary | ICD-10-CM

## 2014-01-28 DIAGNOSIS — E063 Autoimmune thyroiditis: Secondary | ICD-10-CM

## 2014-01-28 DIAGNOSIS — E038 Other specified hypothyroidism: Secondary | ICD-10-CM | POA: Diagnosis not present

## 2014-01-28 MED ORDER — LEVOTHYROXINE SODIUM 125 MCG PO TABS
125.0000 ug | ORAL_TABLET | Freq: Every day | ORAL | Status: DC
Start: 2014-01-28 — End: 2015-02-12

## 2014-01-28 MED ORDER — FENOFIBRATE MICRONIZED 67 MG PO CAPS: 67.0000 mg | ORAL_CAPSULE | Freq: Every day | ORAL | Status: DC

## 2014-01-28 NOTE — Patient Instructions (Signed)
Alternate after meal readings after lunch with after dinner New doses of Synthroid and fenobrate

## 2014-01-28 NOTE — Progress Notes (Signed)
Patient ID: [name] [name], female   DOB: [birth year], 73 y.o.   MRN: [id number]   Reason for Appointment: Followup of various problems  History of Present Illness   1. Type 2 diabetes mellitus, date of diagnosis: 1986.   The patient is seen today in follow up of long-standing Type 2 diabetes  This has been treated in the last few years with low dose basal bolus insulin regimen. She has not been taking  any oral hypoglycemic drugs for some time The lowest previous HbgA1c  was 6.3, on 07/28/12.    Her blood sugars are overall fairly good with occasional high readings both morning and evening She is also checking her sugar either on waking up or 2  hours after her evening meal but not after her late morning breakfast Although her glucoses averaging only 138 at home she has a relatively higher A1c of 7.2 She thinks she is usually eating small portions She is not able to exercise because of low back pain Not able to lose any weight    The insulin regimen is described as Levemir 22 units hs, Novolog 8 ac twice a day and she is compliant with her regimen.   Side effects from medications: Diarrhea from metformin and nausea and vomiting from GLP-1 drugs  Monitors blood glucose: 1.1 times a day  Glucometer: One Touch.  Blood Glucose readings:   PREMEAL Breakfast Lunch Dinner  PCS  Overall  Glucose range:  93-161     112-183    Mean/median:  127     142   137    Hypoglycemia: None recently.   Meals: she is usually eating low fat meals but is sometimes eating out at lunch; meals usually at 11 AM,and Light supper at 5 pm.  Calorie intake: Usually controlled  Physical activity: exercise: a little on exercise bike Dietician visit: Most recent:, 5/13.   Wt Readings from Last 3 Encounters:  01/28/14 177 lb (80.287 kg)  01/08/14 177 lb (80.287 kg)  11/01/13 175 lb 12.8 oz (79.742 kg)    Lab Results  Component Value Date   HGBA1C 7.2* 01/23/2014   HGBA1C 6.7* 09/13/2013   HGBA1C 6.7*  08/20/2013   Lab Results  Component Value Date   MICROALBUR 17.2* 01/23/2014   LDLCALC 49 09/13/2013   CREATININE 1.5* 01/23/2014     2. CKD: Her creatinine is persistently high even after stopping low dose Tekturna for nephropathy  Lab Results  Component Value Date   CREATININE 1.5* 01/23/2014    3.  HYPOTHYROIDISM: She has had long-standing primary hypothyroidism Has not had any consistent TSH levels for the last 3 visits Although she was told to reduce her dose in  7/15 she is still taking the dose of 112 g Surprisingly her TSH is now high.  She does have fatigue which is not new   Lab Results  Component Value Date   FREET4 1.12 01/23/2014   FREET4 1.42 08/20/2013   FREET4 1.36 01/04/2013   TSH 7.38* 01/23/2014   TSH 0.22* 10/22/2013   TSH 0.14* 08/20/2013         Medication List       This list is accurate as of: 01/28/14 11:59 PM.  Always use your most recent med list.               albuterol 108 (90 BASE) MCG/ACT inhaler  Commonly known as:  PROVENTIL HFA;VENTOLIN HFA  Inhale 2 puffs into the lungs every 6 (six) hours as  needed for wheezing.     aspirin 81 MG chewable tablet  Chew 81 mg by mouth at bedtime.     atorvastatin 80 MG tablet  Commonly known as:  LIPITOR  Take 1 tablet (80 mg total) by mouth daily.     calcium-vitamin D 500-200 MG-UNIT per tablet  Commonly known as:  OSCAL WITH D  Take 1 tablet by mouth every morning.     cilostazol 100 MG tablet  Commonly known as:  PLETAL  Take 100 mg by mouth 2 (two) times daily.     clopidogrel 75 MG tablet  Commonly known as:  PLAVIX  Take 1 tablet (75 mg total) by mouth every morning.     fenofibrate micronized 67 MG capsule  Commonly known as:  LOFIBRA  Take 1 capsule (67 mg total) by mouth daily before breakfast.     ferrous sulfate 325 (65 FE) MG tablet  Take 325 mg by mouth at bedtime.     furosemide 20 MG tablet  Commonly known as:  LASIX  Take 2 tablets the first day, then one  tablet daily     glucose blood test strip  TEST AS DIRECTED 3 TIMES DAILY     HYDROcodone-acetaminophen 5-325 MG per tablet  Commonly known as:  NORCO/VICODIN  Take 1 tablet by mouth 3 (three) times daily as needed.     insulin aspart 100 UNIT/ML FlexPen  Commonly known as:  NOVOLOG  Inject 8 Units into the skin 3 (three) times daily with meals. Usually 2 times daily     Insulin Detemir 100 UNIT/ML Pen  Commonly known as:  LEVEMIR  Inject 20 Units into the skin daily at 10 pm.     Insulin Pen Needle 32G X 4 MM Misc  Commonly known as:  BD PEN NEEDLE NANO U/F  Use 3 needles per day     levothyroxine 125 MCG tablet  Commonly known as:  SYNTHROID, LEVOTHROID  Take 1 tablet (125 mcg total) by mouth daily.     magnesium oxide 400 MG tablet  Commonly known as:  MAG-OX  Take 2 tablets (800 mg total) by mouth at bedtime.     metoprolol succinate 25 MG 24 hr tablet  Commonly known as:  TOPROL-XL  Take 0.5 tablets (12.5 mg total) by mouth every morning.     omeprazole 20 MG capsule  Commonly known as:  PRILOSEC  Take 1 capsule (20 mg total) by mouth daily.     vitamin B-12 1000 MCG tablet  Commonly known as:  CYANOCOBALAMIN  Take 1,000 mcg by mouth every morning.     vitamin C 500 MG tablet  Commonly known as:  ASCORBIC ACID  Take 500 mg by mouth every morning.     Vitamin D (Ergocalciferol) 50000 UNITS Caps capsule  Commonly known as:  DRISDOL  Take 1 capsule (50,000 Units total) by mouth every 7 (seven) days. On thursdays        Allergies: No Known Allergies  Past Medical History  Diagnosis Date  . Diabetes mellitus without complication     type 2  . CHF (congestive heart failure)   . Coronary artery disease   . AAA (abdominal aortic aneurysm) 03/09/2012  . PVD (peripheral vascular disease)   . Carotid artery stenosis     carotid doppler 06/2012 - Right CCA/Bulb/ICA with chronic occlusion; L vertebral artery with abnormal blood flow; L Bulb/Prox ICA  s/p  endarterectomy with mild fibrous plaque, 50% diameter reduction  . Hypertension   .  Hyperlipidemia   . Hypothyroidism   . History of nuclear stress test 11/24/2011    lexiscan; nonischemic, low risk   . PAD (peripheral artery disease)     09/2010 LEAs - R ABI of 0.45, occluded fem-pop bypass graft, L ABI of 0.59 with occluded SFA; severe arterial insuff  . Ischemic cardiomyopathy     EF 03/2010 50-55%    Past Surgical History  Procedure Laterality Date  . Coronary artery bypass graft  1997    x6; internal mammary to LAD, SVG to ramus #1 & #2, SVG to OM, SVG to PDA,   . Abdominal hysterectomy  1986  . Tumor removal    . Coronary stent placement    . Femoral artery repair    . Cystoscopy w/ ureteral stent placement  03/10/2012    Procedure: CYSTOSCOPY WITH RETROGRADE PYELOGRAM/URETERAL STENT PLACEMENT;  Surgeon: Hanley Ben, MD;  Location: [facility];  Service: Urology;  Laterality: Left;  . Carotid endarterectomy Bilateral   . Aortobifemoral bypass grafting  1997    with R fem-pop  . Cardiac catheterization  12/2009    occluded vein to OM & ramus branches, patent vein to PDA, patent LIMA to LAD (Dr. [name], [facility]) - later had thrombectomy of R fem-pop bypass ad R common femoral & profunda femoris artery (Dr. Oneida Alar)  . Transthoracic echocardiogram  03/25/2010    EF [id number], LV systolic function low normal with mild inferoseptal hypocontractility; LA mildly dilated; mod MR; mild TR, RV systolic pressure elevated, mild pulm HTN; AV mildly sclerotic; mild pulm valve regurg; aortic root sclerosis/calcif     Family History  Problem Relation Age of Onset  . Congestive Heart Failure Mother   . Diabetes Mother   . Stroke Father   . Cancer Maternal Aunt     Breast cancer    Social History:  reports that she quit smoking about 4 years ago. She has never used smokeless tobacco. She reports that she does not drink alcohol or use illicit drugs.  Review of Systems -   Cardiovascular ROS:   positive for - CAD and PVD, no recent symptoms  Hyperlipidemia: Has history of high LDL and triglycerides/low HDL  She is compliant with her current doses of 20 mg Crestor but not taking Trilipix because of the brand name cost  Tends to have high triglycerides  which is not in control and for some reason her LDL is higher now despite fairly good diet  No recurrence of liver dysfunction recently  Lab Results  Component Value Date   CHOL 216* 01/23/2014   HDL 37.70* 01/23/2014   LDLCALC 49 09/13/2013   LDLDIRECT 117.3 01/23/2014   TRIG 342.0* 01/23/2014   CHOLHDL 6 01/23/2014    HYPOTHYROIDISM: Iron Deficiency anemia:  Has had  anemia with  persistently low hemoglobin and also iron deficiency. Most recent hemoglobin  is fairly good Previous Iron saturation was 19 in 2/14. Takes iron daily  Also taking B12.  With iron infusion her hemoglobin was improved previously   Hemoccult was negative in 9/13.   Lab Results  Component Value Date   WBC 5.2 08/20/2013   HGB 11.8* 08/20/2013   HCT 35.4* 08/20/2013   MCV 89.3 08/20/2013   PLT 224.0 08/20/2013    Vitamin D deficiency: She has been on supplements and taking her weekly dose consistently  Recurrent UTI: currently asymptomatic, recently had Cipro from urologist but not clear if she has had a culture.  Previous culture has not shown any significant  growth   ?  Diastolic dysfunction with history of dyspnea.  She is taking Lasix daily now without any symptoms or change in renal function or potassium    LABS:  Appointment on 01/23/2014  Component Date Value Ref Range Status  . Hgb A1c MFr Bld 01/23/2014 7.2* 4.6 - 6.5 % Final   Glycemic Control Guidelines for People with Diabetes:Non Diabetic:  <6%Goal of Therapy: <7%Additional Action Suggested:  >8%   . Sodium 01/23/2014 140  135 - 145 mEq/L Final  . Potassium 01/23/2014 4.4  3.5 - 5.1 mEq/L Final  . Chloride 01/23/2014 103  96 - 112 mEq/L Final  . CO2 01/23/2014 28  19 - 32  mEq/L Final  . Glucose, Bld 01/23/2014 145* 70 - 99 mg/dL Final  . BUN 01/23/2014 28* 6 - 23 mg/dL Final  . Creatinine, Ser 01/23/2014 1.5* 0.4 - 1.2 mg/dL Final  . Total Bilirubin 01/23/2014 0.6  0.2 - 1.2 mg/dL Final  . Alkaline Phosphatase 01/23/2014 69  39 - 117 U/L Final  . AST 01/23/2014 30  0 - 37 U/L Final  . ALT 01/23/2014 23  0 - 35 U/L Final  . Total Protein 01/23/2014 7.0  6.0 - 8.3 g/dL Final  . Albumin 01/23/2014 3.6  3.5 - 5.2 g/dL Final  . Calcium 01/23/2014 9.7  8.4 - 10.5 mg/dL Final  . GFR 01/23/2014 36.20* >60.00 mL/min Final  . Color, Urine 01/23/2014 YELLOW  Yellow;Lt. Yellow Final  . APPearance 01/23/2014 CLEAR  Clear Final  . Specific Gravity, Urine 01/23/2014 1.015  1.000-1.030 Final  . pH 01/23/2014 7.0  5.0 - 8.0 Final  . Total Protein, Urine 01/23/2014 30* Negative Final  . Urine Glucose 01/23/2014 NEGATIVE  Negative Final  . Ketones, ur 01/23/2014 NEGATIVE  Negative Final  . Bilirubin Urine 01/23/2014 NEGATIVE  Negative Final  . Hgb urine dipstick 01/23/2014 MODERATE* Negative Final  . Urobilinogen, UA 01/23/2014 0.2  0.0 - 1.0 Final  . Leukocytes, UA 01/23/2014 LARGE* Negative Final  . Nitrite 01/23/2014 NEGATIVE  Negative Final  . WBC, UA 01/23/2014 TNTC(>50/hpf)* 0-2/hpf Final  . RBC / HPF 01/23/2014 7-10/hpf* 0-2/hpf Final  . Mucus, UA 01/23/2014 Presence of* None Final  . Squamous Epithelial / LPF 01/23/2014 Few(5-10/hpf)* Rare(0-4/hpf) Final  . Bacteria, UA 01/23/2014 Many(>50/hpf)* None Final  . Microalb, Ur 01/23/2014 17.2* 0.0 - 1.9 mg/dL Final  . Creatinine,U 01/23/2014 37.0   Final  . Microalb Creat Ratio 01/23/2014 46.5* 0.0 - 30.0 mg/g Final  . Cholesterol 01/23/2014 216* 0 - 200 mg/dL Final   ATP III Classification       Desirable:  < 200 mg/dL               Borderline High:  200 - 239 mg/dL          High:  > = 240 mg/dL  . Triglycerides 01/23/2014 342.0* 0.0 - 149.0 mg/dL Final   Normal:  <150 mg/dLBorderline High:  150 - 199 mg/dL  .  HDL 01/23/2014 37.70* >39.00 mg/dL Final  . VLDL 01/23/2014 68.4* 0.0 - 40.0 mg/dL Final  . Total CHOL/HDL Ratio 01/23/2014 6   Final                  Men          Women1/2 Average Risk     3.4          3.3Average Risk          5.0  4.42X Average Risk          9.6          7.13X Average Risk          15.0          11.0                      . NonHDL 01/23/2014 178.30   Final   NOTE:  Non-HDL goal should be 30 mg/dL higher than patient's LDL goal (i.e. LDL goal of < 70 mg/dL, would have non-HDL goal of < 100 mg/dL)  . TSH 01/23/2014 7.38* 0.35 - 4.50 uIU/mL Final  . Free T4 01/23/2014 1.12  0.60 - 1.60 ng/dL Final  . Direct LDL 01/23/2014 117.3   Final   Optimal:  <100 mg/dLNear or Above Optimal:  100-129 mg/dLBorderline High:  130-159 mg/dLHigh:  160-189 mg/dLVery High:  >190 mg/dL      Examination:   BP 143/67 mmHg  Pulse 69  Temp(Src) 98.5 F (36.9 C)  Resp 16  Ht 5' 6.5" (1.689 m)  Wt 177 lb (80.287 kg)  BMI 28.14 kg/m2  SpO2 94%  Body mass index is 28.14 kg/(m^2).   Repeat blood pressure 138/62  No pedal edema present  Assesment/PLAN:   1. DIABETES: Her blood sugars are overall periodically high  but not consistent. Her A1c is relatively higher but not clear why. She does need to check more readings after her first meal of the day Encouraged her to do a little exercise as tolerated, she is taking some pain medications for her back with control Also discussed eating balanced meals with enough protein both times  2. History of mild hypertension: Blood pressure is fairly good without any medications, only on low-dose metoprolol.  Will continue to monitor her blood pressure 3. CKD and nephropathy: Her creatinine has been variable but currently only mildly increased for no apparent reason, likely to be nephrosclerosis.  Hyperkalemia: Stable with stopping Tekturna.  She tends to have mild increase in microalbumin which is stable  4. HYPOTHYROIDISM: Her TSH is now  relatively  high even though she has been on the same dosage before and did not reduce it.  She does have tendency to fatigue and will change her dose to 125 g 5. Low back pain: She is still requiring analgesics with reasonable control of symptoms  6. History of recurrent UTI: currently asymptomatic and still has pyuria, will check culture.   7. Possible fluid overload/mild  diastolic CHF. She is doing well with low-dose Lasix.  She will continue this and will continue monitoring her renal function  8. Hyperlipidemia: needs to start  fenofibrate low-dose because of high triglycerides and this may help her LDL also.  Crestor dose is somewhat limited because of her renal dysfunction.  May consider adding WelChol  9. Preventive care: She has  been regular with her mammograms  Patient Instructions  Alternate after meal readings after lunch with after dinner New doses of Synthroid and fenobrate  Counseling time over 50% of today's 25 minute visit   [facility]  01/29/2014

## 2014-01-31 LAB — URINE CULTURE

## 2014-02-04 ENCOUNTER — Other Ambulatory Visit: Payer: Self-pay | Admitting: *Deleted

## 2014-02-04 ENCOUNTER — Telehealth: Payer: Self-pay | Admitting: Endocrinology

## 2014-02-04 MED ORDER — CEPHALEXIN 250 MG PO CAPS: 250.0000 mg | ORAL_CAPSULE | Freq: Three times a day (TID) | ORAL | Status: DC

## 2014-02-04 MED ORDER — OMEPRAZOLE 20 MG PO CPDR: 20.0000 mg | DELAYED_RELEASE_CAPSULE | Freq: Every day | ORAL | Status: DC

## 2014-02-04 NOTE — Telephone Encounter (Signed)
Pt needs mail order rx for omeprazole 20 mg  90 day supply and rx for kidney infections to [facility]

## 2014-02-04 NOTE — Telephone Encounter (Signed)
Please see result note 

## 2014-02-04 NOTE — Telephone Encounter (Signed)
rx sent, please advise on antibiotic.

## 2014-02-09 ENCOUNTER — Other Ambulatory Visit: Payer: Self-pay | Admitting: Endocrinology

## 2014-02-22 ENCOUNTER — Other Ambulatory Visit: Payer: Self-pay | Admitting: *Deleted

## 2014-02-22 DIAGNOSIS — R0600 Dyspnea, unspecified: Secondary | ICD-10-CM

## 2014-02-22 MED ORDER — ALBUTEROL SULFATE HFA 108 (90 BASE) MCG/ACT IN AERS: 2.0000 | INHALATION_SPRAY | Freq: Four times a day (QID) | RESPIRATORY_TRACT | Status: DC | PRN

## 2014-03-05 ENCOUNTER — Other Ambulatory Visit: Payer: Self-pay | Admitting: *Deleted

## 2014-03-05 MED ORDER — CLOPIDOGREL BISULFATE 75 MG PO TABS: 75.0000 mg | ORAL_TABLET | Freq: Every morning | ORAL | Status: DC

## 2014-03-05 MED ORDER — HYDROCODONE-ACETAMINOPHEN 5-325 MG PO TABS: 1.0000 | ORAL_TABLET | Freq: Three times a day (TID) | ORAL | Status: DC | PRN

## 2014-03-13 ENCOUNTER — Other Ambulatory Visit: Payer: Self-pay | Admitting: Endocrinology

## 2014-03-15 DIAGNOSIS — H4011X1 Primary open-angle glaucoma, mild stage: Secondary | ICD-10-CM | POA: Diagnosis not present

## 2014-04-05 ENCOUNTER — Telehealth: Payer: Self-pay | Admitting: *Deleted

## 2014-04-05 NOTE — Telephone Encounter (Signed)
Okay to stop Plavix for her injection

## 2014-04-05 NOTE — Telephone Encounter (Signed)
Patient needs to be scheduled for an injection- left L4-L5 ESI, also needs permission to hold Plavix.  I will defer to Dr Gwenlyn Found.

## 2014-04-08 NOTE — Telephone Encounter (Signed)
Form signed by Dr Gwenlyn Found and faxed back to [facility]

## 2014-04-24 ENCOUNTER — Other Ambulatory Visit (INDEPENDENT_AMBULATORY_CARE_PROVIDER_SITE_OTHER): Payer: Medicare Other

## 2014-04-24 DIAGNOSIS — E039 Hypothyroidism, unspecified: Secondary | ICD-10-CM | POA: Diagnosis not present

## 2014-04-24 DIAGNOSIS — I10 Essential (primary) hypertension: Secondary | ICD-10-CM | POA: Diagnosis not present

## 2014-04-24 DIAGNOSIS — E1129 Type 2 diabetes mellitus with other diabetic kidney complication: Secondary | ICD-10-CM

## 2014-04-24 DIAGNOSIS — E1165 Type 2 diabetes mellitus with hyperglycemia: Secondary | ICD-10-CM | POA: Diagnosis not present

## 2014-04-24 DIAGNOSIS — E063 Autoimmune thyroiditis: Secondary | ICD-10-CM

## 2014-04-24 DIAGNOSIS — E785 Hyperlipidemia, unspecified: Secondary | ICD-10-CM | POA: Diagnosis not present

## 2014-04-24 DIAGNOSIS — E782 Mixed hyperlipidemia: Secondary | ICD-10-CM

## 2014-04-24 DIAGNOSIS — IMO0002 Reserved for concepts with insufficient information to code with codable children: Secondary | ICD-10-CM

## 2014-04-24 LAB — URINALYSIS, ROUTINE W REFLEX MICROSCOPIC
BILIRUBIN URINE: NEGATIVE
Ketones, ur: NEGATIVE
NITRITE: NEGATIVE
Specific Gravity, Urine: 1.015 (ref 1.000–1.030)
Total Protein, Urine: 30 — AB
UROBILINOGEN UA: 0.2 (ref 0.0–1.0)
Urine Glucose: NEGATIVE
pH: 6.5 (ref 5.0–8.0)

## 2014-04-24 LAB — LIPID PANEL
Cholesterol: 216 mg/dL — ABNORMAL HIGH (ref 0–200)
HDL: 39.5 mg/dL (ref >39.00)
NonHDL: 176.5
Total CHOL/HDL Ratio: 5
Triglycerides: 311 mg/dL — ABNORMAL HIGH (ref 0.0–149.0)
VLDL: 62.2 mg/dL — AB (ref 0.0–40.0)

## 2014-04-24 LAB — TSH: TSH: 2.07 u[IU]/mL (ref 0.35–4.50)

## 2014-04-24 LAB — COMPREHENSIVE METABOLIC PANEL
ALK PHOS: 53 U/L (ref 39–117)
ALT: 17 U/L (ref 0–35)
AST: 26 U/L (ref 0–37)
Albumin: 3.8 g/dL (ref 3.5–5.2)
BILIRUBIN TOTAL: 0.4 mg/dL (ref 0.2–1.2)
BUN: 46 mg/dL — ABNORMAL HIGH (ref 6–23)
CHLORIDE: 104 meq/L (ref 96–112)
CO2: 27 mEq/L (ref 19–32)
Calcium: 9.8 mg/dL (ref 8.4–10.5)
Creatinine, Ser: 1.74 mg/dL — ABNORMAL HIGH (ref 0.40–1.20)
GFR: 30.48 mL/min — ABNORMAL LOW (ref >60.00)
GLUCOSE: 104 mg/dL — AB (ref 70–99)
Potassium: 4.1 mEq/L (ref 3.5–5.1)
Sodium: 140 mEq/L (ref 135–145)
Total Protein: 7 g/dL (ref 6.0–8.3)

## 2014-04-24 LAB — HEMOGLOBIN A1C: Hgb A1c MFr Bld: 6.9 % — ABNORMAL HIGH (ref 4.6–6.5)

## 2014-04-24 LAB — T4, FREE: FREE T4: 1.29 ng/dL (ref 0.60–1.60)

## 2014-04-24 LAB — LDL CHOLESTEROL, DIRECT: Direct LDL: 121 mg/dL

## 2014-04-26 ENCOUNTER — Other Ambulatory Visit: Payer: Self-pay | Admitting: Endocrinology

## 2014-04-26 ENCOUNTER — Other Ambulatory Visit: Payer: Self-pay | Admitting: *Deleted

## 2014-04-26 MED ORDER — FUROSEMIDE 20 MG PO TABS: ORAL_TABLET | ORAL | Status: DC

## 2014-04-29 ENCOUNTER — Other Ambulatory Visit: Payer: Self-pay | Admitting: *Deleted

## 2014-04-29 ENCOUNTER — Encounter: Payer: Self-pay | Admitting: Endocrinology

## 2014-04-29 ENCOUNTER — Ambulatory Visit (INDEPENDENT_AMBULATORY_CARE_PROVIDER_SITE_OTHER): Payer: Medicare Other | Admitting: Endocrinology

## 2014-04-29 VITALS — BP 120/62 | HR 82 | Temp 97.9°F | Resp 16 | Ht 66.5 in | Wt 173.0 lb

## 2014-04-29 DIAGNOSIS — D649 Anemia, unspecified: Secondary | ICD-10-CM

## 2014-04-29 DIAGNOSIS — E782 Mixed hyperlipidemia: Secondary | ICD-10-CM

## 2014-04-29 DIAGNOSIS — N289 Disorder of kidney and ureter, unspecified: Secondary | ICD-10-CM | POA: Diagnosis not present

## 2014-04-29 DIAGNOSIS — E038 Other specified hypothyroidism: Secondary | ICD-10-CM | POA: Diagnosis not present

## 2014-04-29 DIAGNOSIS — E063 Autoimmune thyroiditis: Secondary | ICD-10-CM

## 2014-04-29 DIAGNOSIS — IMO0002 Reserved for concepts with insufficient information to code with codable children: Secondary | ICD-10-CM

## 2014-04-29 DIAGNOSIS — E1165 Type 2 diabetes mellitus with hyperglycemia: Secondary | ICD-10-CM

## 2014-04-29 MED ORDER — EZETIMIBE 10 MG PO TABS: 10.0000 mg | ORAL_TABLET | Freq: Every day | ORAL | Status: DC

## 2014-04-29 MED ORDER — HYDROCODONE-ACETAMINOPHEN 5-325 MG PO TABS: ORAL_TABLET | ORAL | Status: DC

## 2014-04-29 NOTE — Patient Instructions (Signed)
Levemir 24 units  Zetia in addition to Atorvastatin

## 2014-04-29 NOTE — Progress Notes (Signed)
Patient ID: [name] [name], female   DOB: 1940/07/18, 74 y.o.   MRN: [id number]   Reason for Appointment: Followup of various problems  History of Present Illness   1. Type 2 diabetes mellitus, date of diagnosis: 1986.   The patient is seen today in follow up of long-standing Type 2 diabetes  This has been treated in the last few years with low dose basal bolus insulin regimen. She has not been taking  any oral hypoglycemic drugs including metformin partly because of GI side effects The lowest previous HbgA1c  was 6.3, on 07/28/12.    Her blood sugars are overall fairly good with A1c still below 7% However she is tending to have relatively high readings fasting compared to the last visit Also sometimes will have higher reading after breakfast without increase in carbohydrate intake Taking insulin fairly consistently including before meals She is also checking her sugar either on waking up or 2  hours after meals usually Although she has lost a little weight she is not able to exercise much still   The insulin regimen is described as Levemir 22 units hs, Novolog 8 ac twice a day and she is compliant with her regimen.   Side effects from medications: Diarrhea from metformin and nausea and vomiting from GLP-1 drugs  Monitors blood glucose: 1.1 times a day  Glucometer: One Touch.  Blood Glucose readings:   PRE-MEAL Breakfast Lunch Dinner Bedtime Overall  Glucose range: 114-162 128-190     median: 147    147   POST-MEAL PC Breakfast PC Lunch PC Dinner  Glucose range: 100-211  106-159  Mean/median:      Hypoglycemia: None recently.   Meals: she is usually eating low fat meals but is sometimes eating out at lunch; meals usually at 11 AM,and Light supper at 5 pm.  Calorie intake: Usually controlled. Moderate Carbs Physical activity: exercise: a little on exercise bike Dietician visit: Most recent:, 5/13.   Wt Readings from Last 3 Encounters:  04/29/14 173 lb (78.472 kg)   01/28/14 177 lb (80.287 kg)  01/08/14 177 lb (80.287 kg)    Lab Results  Component Value Date   HGBA1C 6.9* 04/24/2014   HGBA1C 7.2* 01/23/2014   HGBA1C 6.7* 09/13/2013   Lab Results  Component Value Date   MICROALBUR 17.2* 01/23/2014   LDLCALC 49 09/13/2013   CREATININE 1.74* 04/24/2014     2. CKD: Her creatinine is persistently high even after stopping low dose Tekturna for nephropathy Does not have any significant proteinuria now  Lab Results  Component Value Date   CREATININE 1.74* 04/24/2014   CREATININE 1.5* 01/23/2014   CREATININE 1.7* 10/22/2013    3.  HYPOTHYROIDISM: She has had long-standing primary hypothyroidism Has in normal TSH finally, previously had been variable and her dose was increased in 11/15 to 125 g. She does not complain of any fatigue   Lab Results  Component Value Date   FREET4 1.29 04/24/2014   FREET4 1.12 01/23/2014   FREET4 1.42 08/20/2013   TSH 2.07 04/24/2014   TSH 7.38* 01/23/2014   TSH 0.22* 10/22/2013         Medication List       This list is accurate as of: 04/29/14 11:59 PM.  Always use your most recent med list.               albuterol 108 (90 BASE) MCG/ACT inhaler  Commonly known as:  PROVENTIL HFA;VENTOLIN HFA  Inhale 2 puffs into  the lungs every 6 (six) hours as needed for wheezing.     aspirin 81 MG chewable tablet  Chew 81 mg by mouth at bedtime.     atorvastatin 80 MG tablet  Commonly known as:  LIPITOR  TAKE 1 TABLET DAILY     calcium-vitamin D 500-200 MG-UNIT per tablet  Commonly known as:  OSCAL WITH D  Take 1 tablet by mouth every morning.     cephALEXin 250 MG capsule  Commonly known as:  KEFLEX  Take 1 capsule (250 mg total) by mouth 3 (three) times daily.     cilostazol 100 MG tablet  Commonly known as:  PLETAL  Take 100 mg by mouth 2 (two) times daily.     clopidogrel 75 MG tablet  Commonly known as:  PLAVIX  Take 1 tablet (75 mg total) by mouth every morning.     ezetimibe 10 MG  tablet  Commonly known as:  ZETIA  Take 1 tablet (10 mg total) by mouth daily.     fenofibrate micronized 67 MG capsule  Commonly known as:  LOFIBRA  Take 1 capsule (67 mg total) by mouth daily before breakfast.     ferrous sulfate 325 (65 FE) MG tablet  Take 325 mg by mouth at bedtime.     furosemide 20 MG tablet  Commonly known as:  LASIX  Take 1 tablet daily     glucose blood test strip  TEST AS DIRECTED 3 TIMES DAILY     HYDROcodone-acetaminophen 5-325 MG per tablet  Commonly known as:  NORCO/VICODIN  Take 2 tablets at bedtime     insulin aspart 100 UNIT/ML FlexPen  Commonly known as:  NOVOLOG  Inject 8 Units into the skin 3 (three) times daily with meals. Usually 2 times daily     Insulin Pen Needle 32G X 4 MM Misc  Commonly known as:  BD PEN NEEDLE NANO U/F  Use 3 needles per day     latanoprost 0.005 % ophthalmic solution  Commonly known as:  XALATAN  Place 1 drop into both eyes at bedtime.     LEVEMIR FLEXTOUCH 100 UNIT/ML Pen  Generic drug:  Insulin Detemir  INJECT 18 UNITS AT BEDTIME     levothyroxine 125 MCG tablet  Commonly known as:  SYNTHROID, LEVOTHROID  Take 1 tablet (125 mcg total) by mouth daily.     magnesium oxide 400 MG tablet  Commonly known as:  MAG-OX  Take 2 tablets (800 mg total) by mouth at bedtime.     metoprolol succinate 25 MG 24 hr tablet  Commonly known as:  TOPROL-XL  Take 0.5 tablets (12.5 mg total) by mouth every morning.     omeprazole 20 MG capsule  Commonly known as:  PRILOSEC  TAKE 1 CAPSULE DAILY     vitamin B-12 1000 MCG tablet  Commonly known as:  CYANOCOBALAMIN  Take 1,000 mcg by mouth every morning.     vitamin C 500 MG tablet  Commonly known as:  ASCORBIC ACID  Take 500 mg by mouth every morning.     Vitamin D (Ergocalciferol) 50000 UNITS Caps capsule  Commonly known as:  DRISDOL  Take 1 capsule (50,000 Units total) by mouth every 7 (seven) days. On thursdays        Allergies: No Known Allergies  Past  Medical History  Diagnosis Date  . Diabetes mellitus without complication     type 2  . CHF (congestive heart failure)   . Coronary artery disease   .  AAA (abdominal aortic aneurysm) 03/09/2012  . PVD (peripheral vascular disease)   . Carotid artery stenosis     carotid doppler 06/2012 - Right CCA/Bulb/ICA with chronic occlusion; L vertebral artery with abnormal blood flow; L Bulb/Prox ICA  s/p endarterectomy with mild fibrous plaque, 50% diameter reduction  . Hypertension   . Hyperlipidemia   . Hypothyroidism   . History of nuclear stress test 11/24/2011    lexiscan; nonischemic, low risk   . PAD (peripheral artery disease)     09/2010 LEAs - R ABI of 0.45, occluded fem-pop bypass graft, L ABI of 0.59 with occluded SFA; severe arterial insuff  . Ischemic cardiomyopathy     EF 03/2010 50-55%    Past Surgical History  Procedure Laterality Date  . Coronary artery bypass graft  1997    x6; internal mammary to LAD, SVG to ramus #1 & #2, SVG to OM, SVG to PDA,   . Abdominal hysterectomy  1986  . Tumor removal    . Coronary stent placement    . Femoral artery repair    . Cystoscopy w/ ureteral stent placement  03/10/2012    Procedure: CYSTOSCOPY WITH RETROGRADE PYELOGRAM/URETERAL STENT PLACEMENT;  Surgeon: Hanley Ben, MD;  Location: [facility];  Service: Urology;  Laterality: Left;  . Carotid endarterectomy Bilateral   . Aortobifemoral bypass grafting  1997    with R fem-pop  . Cardiac catheterization  12/2009    occluded vein to OM & ramus branches, patent vein to PDA, patent LIMA to LAD (Dr. [name], [facility]) - later had thrombectomy of R fem-pop bypass ad R common femoral & profunda femoris artery (Dr. Oneida Alar)  . Transthoracic echocardiogram  03/25/2010    EF 99991111, LV systolic function low normal with mild inferoseptal hypocontractility; LA mildly dilated; mod MR; mild TR, RV systolic pressure elevated, mild pulm HTN; AV mildly sclerotic; mild pulm valve regurg; aortic root sclerosis/calcif       Family History  Problem Relation Age of Onset  . Congestive Heart Failure Mother   . Diabetes Mother   . Stroke Father   . Cancer Maternal Aunt     Breast cancer    Social History:  reports that she quit smoking about 4 years ago. She has never used smokeless tobacco. She reports that she does not drink alcohol or use illicit drugs.  Review of Systems -   Cardiovascular ROS:  positive for - CAD and PVD, no recent symptoms  Hyperlipidemia: Has history of high LDL and triglycerides/low HDL  She is compliant with her regimen of Lipitor 80 mg.  However her LDL is much higher than with Crestor 20 mg which she did not want to take because of cost Tends to have high triglycerides  which is not in control also despite taking fenofibrate No recurrence of liver dysfunction which she had previously  Lab Results  Component Value Date   CHOL 216* 04/24/2014   HDL 39.50 04/24/2014   LDLCALC 49 09/13/2013   LDLDIRECT 121.0 04/24/2014   TRIG 311.0* 04/24/2014   CHOLHDL 5 04/24/2014    Iron Deficiency anemia:  Has had  anemia with  persistently low hemoglobin and also iron deficiency. Previous Iron saturation was 19 in 2/14. Takes iron daily  Also taking B12.  With iron infusion her hemoglobin was improved previously  Hemoglobin has been near normal  Hemoccult was negative in 9/13.   Lab Results  Component Value Date   WBC 5.2 08/20/2013   HGB 11.8* 08/20/2013  HCT 35.4* 08/20/2013   MCV 89.3 08/20/2013   PLT 224.0 08/20/2013    Vitamin D deficiency: She has been on supplements and taking her weekly dose consistently  Recurrent UTI: currently asymptomatic  Previous culture has not shown any significant growth  Still has asymptomatic pyuria  ?  Diastolic dysfunction with history of dyspnea.  She is taking Lasix daily now without any symptoms or change in renal function or potassium    LABS:  Appointment on 04/24/2014  Component Date Value Ref Range Status  . Hgb A1c  MFr Bld 04/24/2014 6.9* 4.6 - 6.5 % Final   Glycemic Control Guidelines for People with Diabetes:Non Diabetic:  <6%Goal of Therapy: <7%Additional Action Suggested:  >8%   . Sodium 04/24/2014 140  135 - 145 mEq/L Final  . Potassium 04/24/2014 4.1  3.5 - 5.1 mEq/L Final  . Chloride 04/24/2014 104  96 - 112 mEq/L Final  . CO2 04/24/2014 27  19 - 32 mEq/L Final  . Glucose, Bld 04/24/2014 104* 70 - 99 mg/dL Final  . BUN 04/24/2014 46* 6 - 23 mg/dL Final  . Creatinine, Ser 04/24/2014 1.74* 0.40 - 1.20 mg/dL Final  . Total Bilirubin 04/24/2014 0.4  0.2 - 1.2 mg/dL Final  . Alkaline Phosphatase 04/24/2014 53  39 - 117 U/L Final  . AST 04/24/2014 26  0 - 37 U/L Final  . ALT 04/24/2014 17  0 - 35 U/L Final  . Total Protein 04/24/2014 7.0  6.0 - 8.3 g/dL Final  . Albumin 04/24/2014 3.8  3.5 - 5.2 g/dL Final  . Calcium 04/24/2014 9.8  8.4 - 10.5 mg/dL Final  . GFR 04/24/2014 30.48* >60.00 mL/min Final  . Cholesterol 04/24/2014 216* 0 - 200 mg/dL Final   ATP III Classification       Desirable:  < 200 mg/dL               Borderline High:  200 - 239 mg/dL          High:  > = 240 mg/dL  . Triglycerides 04/24/2014 311.0* 0.0 - 149.0 mg/dL Final   Normal:  <150 mg/dLBorderline High:  150 - 199 mg/dL  . HDL 04/24/2014 39.50  >39.00 mg/dL Final  . VLDL 04/24/2014 62.2* 0.0 - 40.0 mg/dL Final  . Total CHOL/HDL Ratio 04/24/2014 5   Final                  Men          Women1/2 Average Risk     3.4          3.3Average Risk          5.0          4.42X Average Risk          9.6          7.13X Average Risk          15.0          11.0                      . NonHDL 04/24/2014 176.50   Final   NOTE:  Non-HDL goal should be 30 mg/dL higher than patient's LDL goal (i.e. LDL goal of < 70 mg/dL, would have non-HDL goal of < 100 mg/dL)  . Direct LDL 04/24/2014 121.0   Final   Optimal:  <100 mg/dLNear or Above Optimal:  100-129 mg/dLBorderline High:  130-159 mg/dLHigh:  160-189 mg/dLVery High:  >190  mg/dL  . Color, Urine  04/24/2014 YELLOW  Yellow;Lt. Yellow Final  . APPearance 04/24/2014 CLEAR  Clear Final  . Specific Gravity, Urine 04/24/2014 1.015  1.000-1.030 Final  . pH 04/24/2014 6.5  5.0 - 8.0 Final  . Total Protein, Urine 04/24/2014 30* Negative Final  . Urine Glucose 04/24/2014 NEGATIVE  Negative Final  . Ketones, ur 04/24/2014 NEGATIVE  Negative Final  . Bilirubin Urine 04/24/2014 NEGATIVE  Negative Final  . Hgb urine dipstick 04/24/2014 SMALL* Negative Final  . Urobilinogen, UA 04/24/2014 0.2  0.0 - 1.0 Final  . Leukocytes, UA 04/24/2014 LARGE* Negative Final  . Nitrite 04/24/2014 NEGATIVE  Negative Final  . WBC, UA 04/24/2014 TNTC(>50/hpf)* 0-2/hpf Final  . RBC / HPF 04/24/2014 0-2/hpf  0-2/hpf Final  . Mucus, UA 04/24/2014 Presence of* None Final  . Squamous Epithelial / LPF 04/24/2014 Few(5-10/hpf)* Rare(0-4/hpf) Final  . Bacteria, UA 04/24/2014 Rare(<10/hpf)* None Final  . TSH 04/24/2014 2.07  0.35 - 4.50 uIU/mL Final  . Free T4 04/24/2014 1.29  0.60 - 1.60 ng/dL Final      Examination:   BP 120/62 mmHg  Pulse 82  Temp(Src) 97.9 F (36.6 C)  Resp 16  Ht 5' 6.5" (1.689 m)  Wt 173 lb (78.472 kg)  BMI 27.51 kg/m2  SpO2 91%  Body mass index is 27.51 kg/(m^2).   Repeat blood pressure 120/62  No pedal edema present  Assesment/PLAN:   1. DIABETES: Her blood sugars are periodically high, more so in the morning overall. A1c is however fairly good and she has lost some weight. She does have some high readings after breakfast also would not consistently Also discussed eating balanced meals with enough protein both times Will try to increase her Levemir by at least 2 units for now and she will adjust it further if needed if her fasting blood sugars are consistently out of range  2. History of mild hypertension: Blood pressure is normal without any medications, only on low-dose metoprolol.  Will continue to monitor her blood pressure 3. CKD and nephropathy: Her creatinine has been  variable but still increased for no apparent reason, likely to be nephrosclerosis.  We will continue to monitor. 4. Hyperkalemia has not recurred.  Stable with stopping Tekturna.  She tends to have mild increase in microalbumin which is stable  5. HYPOTHYROIDISM: Her TSH is now back to normal with 125 g 6. Low back pain: She is still requiring analgesics with reasonable control of symptoms  7. History of recurrent UTI: currently asymptomatic and still has pyuria 8. History of fluid overload/mild  diastolic CHF. She is doing well with low-dose Lasix.  She will continue this  9. Hyperlipidemia: needs to start Zetia in addition to Lipitor because of poor control and history of CAD.  Triglycerides are high despite adding fenofibrate low-dose    Patient Instructions  Levemir 24 units  Zetia in addition to Atorvastatin    [facility]  04/30/2014

## 2014-05-01 ENCOUNTER — Telehealth: Payer: Self-pay | Admitting: Endocrinology

## 2014-05-01 NOTE — Telephone Encounter (Signed)
Patient would like to know what her lab results are  She believes she still may have a UTI   Please advise    Thank you

## 2014-05-02 DIAGNOSIS — M4806 Spinal stenosis, lumbar region: Secondary | ICD-10-CM | POA: Diagnosis not present

## 2014-05-02 NOTE — Telephone Encounter (Signed)
See note below below and please advise.

## 2014-05-03 NOTE — Telephone Encounter (Signed)
What symptoms is she having?  Her urinalysis is always abnormal

## 2014-05-03 NOTE — Telephone Encounter (Signed)
Pt states she is not having any symptoms, but she knows that every time she goes to the bathroom she has a kidney infection. Pt states she is following up with her Kidney Specialist in May. She wanted to know if she should wait until then to discuss. Please advise, Thanks!

## 2014-05-06 NOTE — Telephone Encounter (Signed)
No antibiotic unless has significant burning, or pain on urination

## 2014-05-07 NOTE — Telephone Encounter (Signed)
Noted, patient is aware. 

## 2014-05-07 NOTE — Telephone Encounter (Signed)
See below, I was unable to reach pt about note below.

## 2014-05-27 ENCOUNTER — Other Ambulatory Visit: Payer: Self-pay | Admitting: *Deleted

## 2014-05-27 ENCOUNTER — Telehealth: Payer: Self-pay | Admitting: Endocrinology

## 2014-05-27 ENCOUNTER — Telehealth: Payer: Self-pay | Admitting: *Deleted

## 2014-05-27 MED ORDER — ROSUVASTATIN CALCIUM 20 MG PO TABS: 20.0000 mg | ORAL_TABLET | Freq: Every day | ORAL | Status: DC

## 2014-05-27 NOTE — Telephone Encounter (Signed)
rx sent, patient aware 

## 2014-05-27 NOTE — Telephone Encounter (Signed)
Pt needs call back she wants to be sure she is going to order the correct med crestor

## 2014-05-27 NOTE — Telephone Encounter (Signed)
She will need to change Lipitor to Crestor 20 mg if she does not want to take Zetia

## 2014-05-27 NOTE — Telephone Encounter (Signed)
Patient wants to know if we can send in an rx for Crestor since the zetia was so expensive?  Patient wants a 90 day supply.  Please advise

## 2014-05-30 DIAGNOSIS — M25551 Pain in right hip: Secondary | ICD-10-CM | POA: Diagnosis not present

## 2014-05-30 DIAGNOSIS — M4806 Spinal stenosis, lumbar region: Secondary | ICD-10-CM | POA: Diagnosis not present

## 2014-05-31 ENCOUNTER — Other Ambulatory Visit: Payer: Self-pay | Admitting: Endocrinology

## 2014-06-05 ENCOUNTER — Other Ambulatory Visit (HOSPITAL_COMMUNITY): Payer: Self-pay | Admitting: Cardiovascular Disease

## 2014-06-05 ENCOUNTER — Telehealth: Payer: Self-pay | Admitting: Endocrinology

## 2014-06-05 DIAGNOSIS — I6529 Occlusion and stenosis of unspecified carotid artery: Secondary | ICD-10-CM

## 2014-06-05 MED ORDER — MAGNESIUM OXIDE 400 MG PO TABS: 800.0000 mg | ORAL_TABLET | Freq: Every day | ORAL | Status: DC

## 2014-06-05 NOTE — Telephone Encounter (Signed)
Rx refill sent to pt's pharmacy 

## 2014-06-05 NOTE — Telephone Encounter (Signed)
Patient stated that a prescription was faxed over magnesium, can it be filled today?

## 2014-06-06 DIAGNOSIS — M25551 Pain in right hip: Secondary | ICD-10-CM | POA: Diagnosis not present

## 2014-06-06 DIAGNOSIS — M4806 Spinal stenosis, lumbar region: Secondary | ICD-10-CM | POA: Diagnosis not present

## 2014-06-08 ENCOUNTER — Other Ambulatory Visit: Payer: Self-pay | Admitting: Endocrinology

## 2014-06-18 DIAGNOSIS — N390 Urinary tract infection, site not specified: Secondary | ICD-10-CM | POA: Diagnosis not present

## 2014-06-18 DIAGNOSIS — N1330 Unspecified hydronephrosis: Secondary | ICD-10-CM | POA: Diagnosis not present

## 2014-06-27 ENCOUNTER — Ambulatory Visit (HOSPITAL_COMMUNITY)
Admission: RE | Admit: 2014-06-27 | Discharge: 2014-06-27 | Disposition: A | Discharge status: Home / Self Care | Payer: Medicare Other | Source: Ambulatory Visit | Attending: Cardiology | Admitting: Cardiology

## 2014-06-27 DIAGNOSIS — I6529 Occlusion and stenosis of unspecified carotid artery: Secondary | ICD-10-CM | POA: Insufficient documentation

## 2014-06-27 DIAGNOSIS — I779 Disorder of arteries and arterioles, unspecified: Secondary | ICD-10-CM | POA: Diagnosis not present

## 2014-06-27 NOTE — Progress Notes (Signed)
Carotid Duplex Completed. Soriah Leeman, BS, RDMS, RVT  

## 2014-07-02 ENCOUNTER — Telehealth: Payer: Self-pay | Admitting: *Deleted

## 2014-07-02 DIAGNOSIS — I6523 Occlusion and stenosis of bilateral carotid arteries: Secondary | ICD-10-CM

## 2014-07-02 NOTE — Telephone Encounter (Signed)
Carotid doppler results called to patient.  No change, will repeat in one year.  Voiced understanding.

## 2014-07-02 NOTE — Telephone Encounter (Signed)
-----   Message from [name], MD sent at 07/02/2014  2:53 PM EDT ----- No change from prior study. Repeat in 12 months.

## 2014-07-16 DIAGNOSIS — H35373 Puckering of macula, bilateral: Secondary | ICD-10-CM | POA: Diagnosis not present

## 2014-07-16 DIAGNOSIS — H4011X1 Primary open-angle glaucoma, mild stage: Secondary | ICD-10-CM | POA: Diagnosis not present

## 2014-07-16 LAB — HM DIABETES EYE EXAM

## 2014-07-18 ENCOUNTER — Encounter: Payer: Self-pay | Admitting: *Deleted

## 2014-07-22 ENCOUNTER — Other Ambulatory Visit (INDEPENDENT_AMBULATORY_CARE_PROVIDER_SITE_OTHER): Payer: Medicare Other

## 2014-07-22 DIAGNOSIS — E038 Other specified hypothyroidism: Secondary | ICD-10-CM

## 2014-07-22 DIAGNOSIS — N289 Disorder of kidney and ureter, unspecified: Secondary | ICD-10-CM | POA: Diagnosis not present

## 2014-07-22 DIAGNOSIS — D649 Anemia, unspecified: Secondary | ICD-10-CM | POA: Diagnosis not present

## 2014-07-22 DIAGNOSIS — E1165 Type 2 diabetes mellitus with hyperglycemia: Secondary | ICD-10-CM

## 2014-07-22 DIAGNOSIS — E063 Autoimmune thyroiditis: Secondary | ICD-10-CM

## 2014-07-22 DIAGNOSIS — IMO0002 Reserved for concepts with insufficient information to code with codable children: Secondary | ICD-10-CM

## 2014-07-22 LAB — COMPREHENSIVE METABOLIC PANEL
ALBUMIN: 3.7 g/dL (ref 3.5–5.2)
ALK PHOS: 49 U/L (ref 39–117)
ALT: 17 U/L (ref 0–35)
AST: 26 U/L (ref 0–37)
BUN: 40 mg/dL — AB (ref 6–23)
CO2: 29 mEq/L (ref 19–32)
CREATININE: 1.89 mg/dL — AB (ref 0.40–1.20)
Calcium: 9.7 mg/dL (ref 8.4–10.5)
Chloride: 99 mEq/L (ref 96–112)
GFR: 27.69 mL/min — ABNORMAL LOW (ref >60.00)
GLUCOSE: 131 mg/dL — AB (ref 70–99)
POTASSIUM: 4.5 meq/L (ref 3.5–5.1)
Sodium: 135 mEq/L (ref 135–145)
Total Bilirubin: 0.3 mg/dL (ref 0.2–1.2)
Total Protein: 6.8 g/dL (ref 6.0–8.3)

## 2014-07-22 LAB — CBC
HCT: 35.6 % — ABNORMAL LOW (ref 36.0–46.0)
Hemoglobin: 12 g/dL (ref 12.0–15.0)
MCHC: 33.7 g/dL (ref 30.0–36.0)
MCV: 90.3 fl (ref 78.0–100.0)
PLATELETS: 247 10*3/uL (ref 150.0–400.0)
RBC: 3.95 Mil/uL (ref 3.87–5.11)
RDW: 14.4 % (ref 11.5–15.5)
WBC: 6.8 10*3/uL (ref 4.0–10.5)

## 2014-07-22 LAB — LIPID PANEL
CHOL/HDL RATIO: 5
Cholesterol: 179 mg/dL (ref 0–200)
HDL: 35.3 mg/dL — AB (ref >39.00)
NonHDL: 143.7
TRIGLYCERIDES: 328 mg/dL — AB (ref 0.0–149.0)
VLDL: 65.6 mg/dL — ABNORMAL HIGH (ref 0.0–40.0)

## 2014-07-22 LAB — TSH: TSH: 3.42 u[IU]/mL (ref 0.35–4.50)

## 2014-07-22 LAB — HEMOGLOBIN A1C: HEMOGLOBIN A1C: 6.4 % (ref 4.6–6.5)

## 2014-07-22 LAB — T4, FREE: FREE T4: 1.33 ng/dL (ref 0.60–1.60)

## 2014-07-22 LAB — LDL CHOLESTEROL, DIRECT: Direct LDL: 91 mg/dL

## 2014-07-25 ENCOUNTER — Ambulatory Visit (INDEPENDENT_AMBULATORY_CARE_PROVIDER_SITE_OTHER): Payer: Medicare Other | Admitting: Endocrinology

## 2014-07-25 ENCOUNTER — Other Ambulatory Visit: Payer: Self-pay | Admitting: *Deleted

## 2014-07-25 VITALS — BP 118/58 | HR 69 | Temp 97.7°F | Resp 19 | Ht 66.75 in | Wt 170.0 lb

## 2014-07-25 DIAGNOSIS — E782 Mixed hyperlipidemia: Secondary | ICD-10-CM

## 2014-07-25 DIAGNOSIS — I6523 Occlusion and stenosis of bilateral carotid arteries: Secondary | ICD-10-CM | POA: Diagnosis not present

## 2014-07-25 DIAGNOSIS — E038 Other specified hypothyroidism: Secondary | ICD-10-CM | POA: Diagnosis not present

## 2014-07-25 DIAGNOSIS — I5030 Unspecified diastolic (congestive) heart failure: Secondary | ICD-10-CM

## 2014-07-25 DIAGNOSIS — IMO0002 Reserved for concepts with insufficient information to code with codable children: Secondary | ICD-10-CM

## 2014-07-25 DIAGNOSIS — E1165 Type 2 diabetes mellitus with hyperglycemia: Secondary | ICD-10-CM | POA: Diagnosis not present

## 2014-07-25 DIAGNOSIS — N289 Disorder of kidney and ureter, unspecified: Secondary | ICD-10-CM | POA: Diagnosis not present

## 2014-07-25 DIAGNOSIS — E063 Autoimmune thyroiditis: Secondary | ICD-10-CM

## 2014-07-25 MED ORDER — HYDROCODONE-ACETAMINOPHEN 5-325 MG PO TABS: ORAL_TABLET | ORAL | Status: DC

## 2014-07-25 NOTE — Progress Notes (Signed)
Patient ID: [name] [name], female   DOB: [birth year], 74 y.o.   MRN: [id number]   Reason for Appointment: Followup of various problems  History of Present Illness   1. Type 2 diabetes mellitus, date of diagnosis: 1986.   The patient is seen today in follow up of long-standing Type 2 diabetes  This has been treated in the last few years with low dose basal bolus insulin regimen. She has not been taking  any oral hypoglycemic drugs including metformin partly because of GI side effects   Her blood sugars are overall fairly good with A1c still below 7% and now upper normal However she is  Having some fluctuation in her blood sugars with periodic high readings fasting and after her first meal at mid day   Some of her high readings are related to late night snacks or increase carbohydrate intake at her first meal   She has not been motivated to exercise although her weight has come down.   Her average blood sugar is fairly good about 142 with some postprandial readings  Taking insulin fairly consistently including before meals  The insulin regimen is described as Levemir 24 units hs, Novolog 8 ac twice a day and she is compliant with her regimen.   Side effects from medications: Diarrhea from metformin and nausea and vomiting from GLP-1 drugs  Monitors blood glucose: 1.1 times a day  Glucometer: One Touch.  Blood Glucose readings:   PRE-MEAL Breakfast Lunch Dinner Bedtime Overall  Glucose range:  107-176      Mean/median:  134    139   POST-MEAL PC Breakfast PC Lunch PC Dinner  Glucose range:   123-217  110-155  Mean/median:   148  134   Hypoglycemia: None recently.   Meals: she is usually eating low fat meals but is sometimes eating out at lunch; meals usually at 11 AM,and supper at 5 pm.  Calorie intake: Usually controlled. Moderate Carbs Physical activity: exercise:was on exercise bike but none now Dietician visit: Most recent:, 5/13.   Wt Readings from Last 3  Encounters:  07/25/14 170 lb (77.111 kg)  04/29/14 173 lb (78.472 kg)  01/28/14 177 lb (80.287 kg)    Lab Results  Component Value Date   HGBA1C 6.4 07/22/2014   HGBA1C 6.9* 04/24/2014   HGBA1C 7.2* 01/23/2014   Lab Results  Component Value Date   MICROALBUR 17.2* 01/23/2014   LDLCALC 49 09/13/2013   CREATININE 1.89* 07/22/2014      2. CKD: Her creatinine is persistently high even after stopping low dose Tekturna for nephropathy  her creatinine is gradually increasing Does not have any significant proteinuria now, has a mild increase in urine microalbumin in the 40s  Lab Results  Component Value Date   CREATININE 1.89* 07/22/2014   CREATININE 1.74* 04/24/2014   CREATININE 1.5* 01/23/2014    3.  HYPOTHYROIDISM: She has had long-standing primary hypothyroidism Has  A normal TSH  now, previously had been variable and her dose was increased in 11/15 to 125 g. She does not complain of any fatigue   Lab Results  Component Value Date   FREET4 1.33 07/22/2014   FREET4 1.29 04/24/2014   FREET4 1.12 01/23/2014   TSH 3.42 07/22/2014   TSH 2.07 04/24/2014   TSH 7.38* 01/23/2014         Medication List       This list is accurate as of: 07/25/14  9:19 PM.  Always use your most recent  med list.               albuterol 108 (90 BASE) MCG/ACT inhaler  Commonly known as:  PROVENTIL HFA;VENTOLIN HFA  Inhale 2 puffs into the lungs every 6 (six) hours as needed for wheezing.     aspirin 81 MG chewable tablet  Chew 81 mg by mouth at bedtime.     calcium-vitamin D 500-200 MG-UNIT per tablet  Commonly known as:  OSCAL WITH D  Take 1 tablet by mouth every morning.     clopidogrel 75 MG tablet  Commonly known as:  PLAVIX  Take 1 tablet (75 mg total) by mouth every morning.     clopidogrel 75 MG tablet  Commonly known as:  PLAVIX  TAKE 1 TABLET EVERY MORNING     fenofibrate micronized 67 MG capsule  Commonly known as:  LOFIBRA  Take 1 capsule (67 mg total) by mouth  daily before breakfast.     ferrous sulfate 325 (65 FE) MG tablet  Take 325 mg by mouth at bedtime.     furosemide 20 MG tablet  Commonly known as:  LASIX  Take 1 tablet daily     glucose blood test strip  TEST AS DIRECTED 3 TIMES DAILY     HYDROcodone-acetaminophen 5-325 MG per tablet  Commonly known as:  NORCO/VICODIN  Take 2 tablets at bedtime     insulin aspart 100 UNIT/ML FlexPen  Commonly known as:  NOVOLOG  Inject 8 Units into the skin 3 (three) times daily with meals. Usually 2 times daily     Insulin Pen Needle 32G X 4 MM Misc  Commonly known as:  BD PEN NEEDLE NANO U/F  Use 3 needles per day     latanoprost 0.005 % ophthalmic solution  Commonly known as:  XALATAN  Place 1 drop into both eyes at bedtime.     LEVEMIR FLEXTOUCH 100 UNIT/ML Pen  Generic drug:  Insulin Detemir  INJECT 18 UNITS AT BEDTIME     levothyroxine 125 MCG tablet  Commonly known as:  SYNTHROID, LEVOTHROID  Take 1 tablet (125 mcg total) by mouth daily.     magnesium oxide 400 MG tablet  Commonly known as:  MAG-OX  Take 2 tablets (800 mg total) by mouth at bedtime.     metoprolol succinate 25 MG 24 hr tablet  Commonly known as:  TOPROL-XL  TAKE 1/2 TABLET (=12.5 MG) EVERY MORNING     omeprazole 20 MG capsule  Commonly known as:  PRILOSEC  TAKE 1 CAPSULE DAILY     rosuvastatin 20 MG tablet  Commonly known as:  CRESTOR  Take 1 tablet (20 mg total) by mouth daily.     vitamin B-12 1000 MCG tablet  Commonly known as:  CYANOCOBALAMIN  Take 1,000 mcg by mouth every morning.     vitamin C 500 MG tablet  Commonly known as:  ASCORBIC ACID  Take 500 mg by mouth every morning.     Vitamin D (Ergocalciferol) 50000 UNITS Caps capsule  Commonly known as:  DRISDOL  Take 1 capsule (50,000 Units total) by mouth every 7 (seven) days. On thursdays        Allergies: No Known Allergies  Past Medical History  Diagnosis Date  . Diabetes mellitus without complication     type 2  . CHF  (congestive heart failure)   . Coronary artery disease   . AAA (abdominal aortic aneurysm) 03/09/2012  . PVD (peripheral vascular disease)   . Carotid artery  stenosis     carotid doppler 06/2012 - Right CCA/Bulb/ICA with chronic occlusion; L vertebral artery with abnormal blood flow; L Bulb/Prox ICA  s/p endarterectomy with mild fibrous plaque, 50% diameter reduction  . Hypertension   . Hyperlipidemia   . Hypothyroidism   . History of nuclear stress test 11/24/2011    lexiscan; nonischemic, low risk   . PAD (peripheral artery disease)     09/2010 [facility] - R ABI of 0.45, occluded fem-pop bypass graft, L ABI of 0.59 with occluded SFA; severe arterial insuff  . Ischemic cardiomyopathy     EF 03/2010 50-55%    Past Surgical History  Procedure Laterality Date  . Coronary artery bypass graft  1997    x6; internal mammary to LAD, SVG to ramus #1 & #2, SVG to OM, SVG to PDA,   . Abdominal hysterectomy  1986  . Tumor removal    . Coronary stent placement    . Femoral artery repair    . Cystoscopy w/ ureteral stent placement  03/10/2012    Procedure: CYSTOSCOPY WITH RETROGRADE PYELOGRAM/URETERAL STENT PLACEMENT;  Surgeon: Hanley Ben, MD;  Location: [facility];  Service: Urology;  Laterality: Left;  . Carotid endarterectomy Bilateral   . Aortobifemoral bypass grafting  1997    with R fem-pop  . Cardiac catheterization  12/2009    occluded vein to OM & ramus branches, patent vein to PDA, patent LIMA to LAD (Dr. [name], [facility]) - later had thrombectomy of R fem-pop bypass ad R common femoral & profunda femoris artery (Dr. Oneida Alar)  . Transthoracic echocardiogram  03/25/2010    EF 99991111, LV systolic function low normal with mild inferoseptal hypocontractility; LA mildly dilated; mod MR; mild TR, RV systolic pressure elevated, mild pulm HTN; AV mildly sclerotic; mild pulm valve regurg; aortic root sclerosis/calcif     Family History  Problem Relation Age of Onset  . Congestive Heart Failure Mother   .  Diabetes Mother   . Stroke Father   . Cancer Maternal Aunt     Breast cancer    Social History:  reports that she quit smoking about 5 years ago. She has never used smokeless tobacco. She reports that she does not drink alcohol or use illicit drugs.  Review of Systems -    SHORTNESS of breath: she still thinks she gets short of breath on exertion even with at home Sometimes she will get relief with taking her albuterol inhaler    Also taking Lasix daily as this previously had helped her tendency to dyspnea  No recent edema  Cardiovascular ROS:  positive for - CAD and PVD, no recent symptoms  Hyperlipidemia: Has history of high LDL and triglycerides/low HDL  She  Was changed from Lipitor to Crestor because of higher LDL and she was not wanting to pay the higher cost of Zetia with the Lipitor  LDL is improved but still above her target of 70 Tends to have high triglycerides  which is not in control also despite taking fenofibrate No recurrence of liver dysfunction which she had previously  Lab Results  Component Value Date   CHOL 179 07/22/2014   HDL 35.30* 07/22/2014   LDLCALC 49 09/13/2013   LDLDIRECT 91.0 07/22/2014   TRIG 328.0* 07/22/2014   CHOLHDL 5 07/22/2014    Iron Deficiency anemia:  Has had  anemia with  persistently low hemoglobin and also iron deficiency. Previous Iron saturation was 19 in 2/14. Takes iron  Every other day and hemoglobin has been  near normal now  Also taking B12.   Hemoccult was negative in 9/13.   Lab Results  Component Value Date   WBC 6.8 07/22/2014   HGB 12.0 07/22/2014   HCT 35.6* 07/22/2014   MCV 90.3 07/22/2014   PLT 247.0 07/22/2014    Vitamin D deficiency: She has been on supplements and taking her weekly dose consistently  Recurrent UTI: currently asymptomatic  Previous culture has not shown any significant growth   Followed by urologist.  Apparently has not had any obstruction recently.  ?  Diastolic dysfunction with  history of dyspnea.  She is taking Lasix daily now without any symptoms or change in renal function or potassium   She has carotid ultrasound followed by a cardiologist and recently this is stable.    LABS:  Lab on 07/22/2014  Component Date Value Ref Range Status  . Hgb A1c MFr Bld 07/22/2014 6.4  4.6 - 6.5 % Final   Glycemic Control Guidelines for People with Diabetes:Non Diabetic:  <6%Goal of Therapy: <7%Additional Action Suggested:  >8%   . Sodium 07/22/2014 135  135 - 145 mEq/L Final  . Potassium 07/22/2014 4.5  3.5 - 5.1 mEq/L Final  . Chloride 07/22/2014 99  96 - 112 mEq/L Final  . CO2 07/22/2014 29  19 - 32 mEq/L Final  . Glucose, Bld 07/22/2014 131* 70 - 99 mg/dL Final  . BUN 07/22/2014 40* 6 - 23 mg/dL Final  . Creatinine, Ser 07/22/2014 1.89* 0.40 - 1.20 mg/dL Final  . Total Bilirubin 07/22/2014 0.3  0.2 - 1.2 mg/dL Final  . Alkaline Phosphatase 07/22/2014 49  39 - 117 U/L Final  . AST 07/22/2014 26  0 - 37 U/L Final  . ALT 07/22/2014 17  0 - 35 U/L Final  . Total Protein 07/22/2014 6.8  6.0 - 8.3 g/dL Final  . Albumin 07/22/2014 3.7  3.5 - 5.2 g/dL Final  . Calcium 07/22/2014 9.7  8.4 - 10.5 mg/dL Final  . GFR 07/22/2014 27.69* >60.00 mL/min Final  . Cholesterol 07/22/2014 179  0 - 200 mg/dL Final   ATP III Classification       Desirable:  < 200 mg/dL               Borderline High:  200 - 239 mg/dL          High:  > = 240 mg/dL  . Triglycerides 07/22/2014 328.0* 0.0 - 149.0 mg/dL Final   Normal:  <150 mg/dLBorderline High:  150 - 199 mg/dL  . HDL 07/22/2014 35.30* >39.00 mg/dL Final  . VLDL 07/22/2014 65.6* 0.0 - 40.0 mg/dL Final  . Total CHOL/HDL Ratio 07/22/2014 5   Final                  Men          Women1/2 Average Risk     3.4          3.3Average Risk          5.0          4.42X Average Risk          9.6          7.13X Average Risk          15.0          11.0                      . NonHDL 07/22/2014 143.70   Final   NOTE:  Non-HDL goal should be 30 mg/dL higher  than patient's LDL goal (i.e. LDL goal of < 70 mg/dL, would have non-HDL goal of < 100 mg/dL)  . TSH 07/22/2014 3.42  0.35 - 4.50 uIU/mL Final  . Free T4 07/22/2014 1.33  0.60 - 1.60 ng/dL Final  . WBC 07/22/2014 6.8  4.0 - 10.5 K/uL Final  . RBC 07/22/2014 3.95  3.87 - 5.11 Mil/uL Final  . Platelets 07/22/2014 247.0  150.0 - 400.0 K/uL Final  . Hemoglobin 07/22/2014 12.0  12.0 - 15.0 g/dL Final  . HCT 07/22/2014 35.6* 36.0 - 46.0 % Final  . MCV 07/22/2014 90.3  78.0 - 100.0 fl Final  . MCHC 07/22/2014 33.7  30.0 - 36.0 g/dL Final  . RDW 07/22/2014 14.4  11.5 - 15.5 % Final  . Direct LDL 07/22/2014 91.0   Final   Optimal:  <100 mg/dLNear or Above Optimal:  100-129 mg/dLBorderline High:  130-159 mg/dLHigh:  160-189 mg/dLVery High:  >190 mg/dL      Examination:   BP 118/58 mmHg  Pulse 69  Temp(Src) 97.7 F (36.5 C) (Oral)  Resp 19  Ht 5' 6.75" (1.695 m)  Wt 170 lb (77.111 kg)  BMI 26.84 kg/m2  SpO2 95%  Body mass index is 26.84 kg/(m^2).     lungs clear Heart sounds normal No pedal edema present  biceps reflexes are normal  Assesment/PLAN:   1. DIABETES: Her blood sugars are  Overall well controlled with A1c in the upper normal range.  Considering her multiple medical problems, duration of diabetes and age her blood sugars are well controlled  She'll continue the same regimen including basal bolus insulin  Encouraged her to start using exercise bike  2. History of mild hypertension: Blood pressure is normal without any medications, only on low-dose metoprolol.   3. CKD and nephropathy: Her creatinine has been variable but  At a relatively high level now.  It is likely to be  From nephrosclerosis.  However since she is having mild orthostatic drop in blood pressure she may be volume depleted and will have her take Lasix every other day 4. Hyperkalemia has not recurred.  Stable with stopping Tekturna.  She tends to have mild increase in microalbumin which is stable   5. HYPOTHYROIDISM: Her TSH is now   Staying normal with 125 g 6. Low back pain: She is still requiring analgesics with reasonable control of symptoms  7. History of recurrent UTI: currently asymptomatic and  Followed by urologist 8. History of fluid overload/mild  diastolic CHF. She is doing well with low-dose Lasix.  She will continue this  Every other day for now as discussed above  9. Hyperlipidemia: needs to  Continue Crestor brand-name.  Discussed that she may be able to take Zetia in addition when it becomes Generic later this year 10.  anemia:  She will continue iron every other day and hemoglobin is normal. Needs follow-up Hemoccult  Patient Instructions  Regular exercise  Check blood sugars on waking up ..  2.. times a week Also check blood sugars about 2 hours after a meal and do this after different meals by rotation Recommended blood sugar levels on waking up is 90-130 and about 2 hours after meal is 140-180 Please bring blood sugar monitor to each visit.     [facility]  07/25/2014

## 2014-07-25 NOTE — Patient Instructions (Signed)
Regular exercise  Check blood sugars on waking up ..  2.. times a week Also check blood sugars about 2 hours after a meal and do this after different meals by rotation Recommended blood sugar levels on waking up is 90-130 and about 2 hours after meal is 140-180 Please bring blood sugar monitor to each visit.

## 2014-08-20 ENCOUNTER — Other Ambulatory Visit: Payer: Self-pay | Admitting: *Deleted

## 2014-08-20 MED ORDER — VITAMIN D (ERGOCALCIFEROL) 1.25 MG (50000 UNIT) PO CAPS: 50000.0000 [IU] | ORAL_CAPSULE | ORAL | Status: DC

## 2014-09-05 ENCOUNTER — Other Ambulatory Visit: Payer: Self-pay | Admitting: *Deleted

## 2014-09-05 ENCOUNTER — Telehealth: Payer: Self-pay | Admitting: Endocrinology

## 2014-09-05 MED ORDER — DIPHENOXYLATE-ATROPINE 2.5-0.025 MG PO TABS: 1.0000 | ORAL_TABLET | Freq: Three times a day (TID) | ORAL | Status: DC

## 2014-09-05 NOTE — Telephone Encounter (Signed)
Noted, patient is aware, rx sent for lomotil 10 tablets no refills.

## 2014-09-05 NOTE — Telephone Encounter (Signed)
Please see below and advise.

## 2014-09-05 NOTE — Telephone Encounter (Signed)
Patient called and would like to leave a message for [facility]   [name] states she has been experiencing diarrhea for 8 days She tried imodium; no relief  Please advise patient    Thank you

## 2014-09-05 NOTE — Telephone Encounter (Signed)
If she is having watery diarrhea will need to do her stool culture.  Meantime she can take Lomotil 1 tablet up to 3 times a day as needed and also Kaopectate

## 2014-09-30 ENCOUNTER — Other Ambulatory Visit: Payer: Self-pay | Admitting: *Deleted

## 2014-09-30 MED ORDER — INSULIN ASPART 100 UNIT/ML FLEXPEN: PEN_INJECTOR | SUBCUTANEOUS | Status: DC

## 2014-10-16 ENCOUNTER — Inpatient Hospital Stay (HOSPITAL_COMMUNITY)
Admission: EM | Admit: 2014-10-16 | Discharge: 2014-10-18 | DRG: 690 | Disposition: A | Discharge status: Home / Self Care | Payer: Medicare Other | Attending: Internal Medicine | Admitting: Internal Medicine

## 2014-10-16 ENCOUNTER — Encounter (HOSPITAL_COMMUNITY): Payer: Self-pay | Admitting: Emergency Medicine

## 2014-10-16 DIAGNOSIS — I509 Heart failure, unspecified: Secondary | ICD-10-CM | POA: Diagnosis present

## 2014-10-16 DIAGNOSIS — Z87440 Personal history of urinary (tract) infections: Secondary | ICD-10-CM

## 2014-10-16 DIAGNOSIS — N10 Acute tubulo-interstitial nephritis: Secondary | ICD-10-CM | POA: Diagnosis not present

## 2014-10-16 DIAGNOSIS — I739 Peripheral vascular disease, unspecified: Secondary | ICD-10-CM | POA: Diagnosis present

## 2014-10-16 DIAGNOSIS — Z79899 Other long term (current) drug therapy: Secondary | ICD-10-CM | POA: Diagnosis not present

## 2014-10-16 DIAGNOSIS — N179 Acute kidney failure, unspecified: Secondary | ICD-10-CM | POA: Diagnosis not present

## 2014-10-16 DIAGNOSIS — I10 Essential (primary) hypertension: Secondary | ICD-10-CM | POA: Diagnosis present

## 2014-10-16 DIAGNOSIS — Z7982 Long term (current) use of aspirin: Secondary | ICD-10-CM

## 2014-10-16 DIAGNOSIS — E875 Hyperkalemia: Secondary | ICD-10-CM | POA: Diagnosis present

## 2014-10-16 DIAGNOSIS — Z794 Long term (current) use of insulin: Secondary | ICD-10-CM

## 2014-10-16 DIAGNOSIS — Y832 Surgical operation with anastomosis, bypass or graft as the cause of abnormal reaction of the patient, or of later complication, without mention of misadventure at the time of the procedure: Secondary | ICD-10-CM | POA: Diagnosis present

## 2014-10-16 DIAGNOSIS — E11649 Type 2 diabetes mellitus with hypoglycemia without coma: Secondary | ICD-10-CM

## 2014-10-16 DIAGNOSIS — E039 Hypothyroidism, unspecified: Secondary | ICD-10-CM | POA: Diagnosis present

## 2014-10-16 DIAGNOSIS — I6529 Occlusion and stenosis of unspecified carotid artery: Secondary | ICD-10-CM | POA: Diagnosis present

## 2014-10-16 DIAGNOSIS — M545 Low back pain: Secondary | ICD-10-CM | POA: Diagnosis present

## 2014-10-16 DIAGNOSIS — I255 Ischemic cardiomyopathy: Secondary | ICD-10-CM | POA: Diagnosis present

## 2014-10-16 DIAGNOSIS — Z87891 Personal history of nicotine dependence: Secondary | ICD-10-CM

## 2014-10-16 DIAGNOSIS — I2510 Atherosclerotic heart disease of native coronary artery without angina pectoris: Secondary | ICD-10-CM | POA: Diagnosis present

## 2014-10-16 DIAGNOSIS — E785 Hyperlipidemia, unspecified: Secondary | ICD-10-CM | POA: Diagnosis present

## 2014-10-16 DIAGNOSIS — Z7951 Long term (current) use of inhaled steroids: Secondary | ICD-10-CM

## 2014-10-16 DIAGNOSIS — Z951 Presence of aortocoronary bypass graft: Secondary | ICD-10-CM | POA: Diagnosis present

## 2014-10-16 DIAGNOSIS — I129 Hypertensive chronic kidney disease with stage 1 through stage 4 chronic kidney disease, or unspecified chronic kidney disease: Secondary | ICD-10-CM | POA: Diagnosis present

## 2014-10-16 DIAGNOSIS — E1122 Type 2 diabetes mellitus with diabetic chronic kidney disease: Secondary | ICD-10-CM | POA: Diagnosis present

## 2014-10-16 DIAGNOSIS — T82898A Other specified complication of vascular prosthetic devices, implants and grafts, initial encounter: Secondary | ICD-10-CM | POA: Diagnosis present

## 2014-10-16 DIAGNOSIS — N12 Tubulo-interstitial nephritis, not specified as acute or chronic: Secondary | ICD-10-CM

## 2014-10-16 DIAGNOSIS — Z7902 Long term (current) use of antithrombotics/antiplatelets: Secondary | ICD-10-CM

## 2014-10-16 DIAGNOSIS — I5041 Acute combined systolic (congestive) and diastolic (congestive) heart failure: Secondary | ICD-10-CM | POA: Diagnosis not present

## 2014-10-16 DIAGNOSIS — E119 Type 2 diabetes mellitus without complications: Secondary | ICD-10-CM

## 2014-10-16 DIAGNOSIS — N183 Chronic kidney disease, stage 3 unspecified: Secondary | ICD-10-CM | POA: Diagnosis present

## 2014-10-16 DIAGNOSIS — A419 Sepsis, unspecified organism: Secondary | ICD-10-CM

## 2014-10-16 LAB — BASIC METABOLIC PANEL
Anion gap: 9 (ref 5–15)
BUN: 43 mg/dL — AB (ref 6–20)
CO2: 23 mmol/L (ref 22–32)
Calcium: 8.9 mg/dL (ref 8.9–10.3)
Chloride: 100 mmol/L — ABNORMAL LOW (ref 101–111)
Creatinine, Ser: 2.31 mg/dL — ABNORMAL HIGH (ref 0.44–1.00)
GFR calc Af Amer: 23 mL/min — ABNORMAL LOW (ref >60)
GFR calc non Af Amer: 20 mL/min — ABNORMAL LOW (ref >60)
GLUCOSE: 270 mg/dL — AB (ref 65–99)
POTASSIUM: 5.8 mmol/L — AB (ref 3.5–5.1)
SODIUM: 132 mmol/L — AB (ref 135–145)

## 2014-10-16 LAB — URINALYSIS, ROUTINE W REFLEX MICROSCOPIC
Glucose, UA: 100 mg/dL — AB
Ketones, ur: NEGATIVE mg/dL
NITRITE: NEGATIVE
PH: 5 (ref 5.0–8.0)
Protein, ur: 100 mg/dL — AB
SPECIFIC GRAVITY, URINE: 1.017 (ref 1.005–1.030)
Urobilinogen, UA: 0.2 mg/dL (ref 0.0–1.0)

## 2014-10-16 LAB — CBC
HEMATOCRIT: 35.2 % — AB (ref 36.0–46.0)
Hemoglobin: 11 g/dL — ABNORMAL LOW (ref 12.0–15.0)
MCH: 28.9 pg (ref 26.0–34.0)
MCHC: 31.3 g/dL (ref 30.0–36.0)
MCV: 92.4 fL (ref 78.0–100.0)
Platelets: 309 10*3/uL (ref 150–400)
RBC: 3.81 MIL/uL — ABNORMAL LOW (ref 3.87–5.11)
RDW: 13.9 % (ref 11.5–15.5)
WBC: 15.9 10*3/uL — ABNORMAL HIGH (ref 4.0–10.5)

## 2014-10-16 LAB — URINE MICROSCOPIC-ADD ON

## 2014-10-16 LAB — I-STAT TROPONIN, ED: Troponin i, poc: 0 ng/mL (ref 0.00–0.08)

## 2014-10-16 LAB — POTASSIUM: POTASSIUM: 5.5 mmol/L — AB (ref 3.5–5.1)

## 2014-10-16 LAB — GLUCOSE, CAPILLARY: Glucose-Capillary: 141 mg/dL — ABNORMAL HIGH (ref 65–99)

## 2014-10-16 MED ORDER — METOPROLOL SUCCINATE 12.5 MG HALF TABLET
12.5000 mg | ORAL_TABLET | Freq: Every day | ORAL | Status: DC
  Administered 2014-10-17 – 2014-10-18 (×2): 12.5 mg via ORAL
  Filled 2014-10-16 (×2): qty 1

## 2014-10-16 MED ORDER — LATANOPROST 0.005 % OP SOLN
1.0000 [drp] | Freq: Every day | OPHTHALMIC | Status: DC
  Administered 2014-10-16 – 2014-10-17 (×2): 1 [drp] via OPHTHALMIC
  Filled 2014-10-16: qty 2.5

## 2014-10-16 MED ORDER — DEXTROSE 5 % IV SOLN
1.0000 g | INTRAVENOUS | Status: DC
  Administered 2014-10-17: 1 g via INTRAVENOUS
  Filled 2014-10-16 (×2): qty 10

## 2014-10-16 MED ORDER — VITAMIN B-12 1000 MCG PO TABS
1000.0000 ug | ORAL_TABLET | Freq: Every morning | ORAL | Status: DC
  Administered 2014-10-17 – 2014-10-18 (×2): 1000 ug via ORAL
  Filled 2014-10-16 (×2): qty 1

## 2014-10-16 MED ORDER — SODIUM CHLORIDE 0.9 % IV SOLN
INTRAVENOUS | Status: DC
  Administered 2014-10-16: 22:00:00 via INTRAVENOUS

## 2014-10-16 MED ORDER — ASPIRIN 81 MG PO CHEW
162.0000 mg | CHEWABLE_TABLET | Freq: Every day | ORAL | Status: DC
  Administered 2014-10-16 – 2014-10-18 (×3): 162 mg via ORAL
  Filled 2014-10-16 (×3): qty 2

## 2014-10-16 MED ORDER — INSULIN ASPART 100 UNIT/ML ~~LOC~~ SOLN
10.0000 [IU] | Freq: Once | SUBCUTANEOUS | Status: DC
  Filled 2014-10-16: qty 1

## 2014-10-16 MED ORDER — CALCIUM CARBONATE-VITAMIN D 500-200 MG-UNIT PO TABS
1.0000 | ORAL_TABLET | Freq: Every morning | ORAL | Status: DC
  Administered 2014-10-17 – 2014-10-18 (×2): 1 via ORAL
  Filled 2014-10-16 (×2): qty 1

## 2014-10-16 MED ORDER — DEXTROSE 5 % IV SOLN
1.0000 g | Freq: Once | INTRAVENOUS | Status: AC
  Administered 2014-10-16: 1 g via INTRAVENOUS
  Filled 2014-10-16: qty 10

## 2014-10-16 MED ORDER — INSULIN DETEMIR 100 UNIT/ML ~~LOC~~ SOLN
22.0000 [IU] | Freq: Every day | SUBCUTANEOUS | Status: DC
  Administered 2014-10-16 – 2014-10-17 (×2): 22 [IU] via SUBCUTANEOUS
  Filled 2014-10-16 (×3): qty 0.22

## 2014-10-16 MED ORDER — ONDANSETRON HCL 4 MG PO TABS: 4.0000 mg | ORAL_TABLET | Freq: Four times a day (QID) | ORAL | Status: DC | PRN

## 2014-10-16 MED ORDER — VITAMIN C 500 MG PO TABS
500.0000 mg | ORAL_TABLET | Freq: Every morning | ORAL | Status: DC
  Administered 2014-10-17 – 2014-10-18 (×2): 500 mg via ORAL
  Filled 2014-10-16 (×2): qty 1

## 2014-10-16 MED ORDER — FUROSEMIDE 20 MG PO TABS
20.0000 mg | ORAL_TABLET | Freq: Every day | ORAL | Status: DC
  Filled 2014-10-16: qty 1

## 2014-10-16 MED ORDER — ONDANSETRON HCL 4 MG/2ML IJ SOLN
4.0000 mg | Freq: Once | INTRAMUSCULAR | Status: AC
  Administered 2014-10-16: 4 mg via INTRAVENOUS
  Filled 2014-10-16: qty 2

## 2014-10-16 MED ORDER — ROSUVASTATIN CALCIUM 20 MG PO TABS
20.0000 mg | ORAL_TABLET | Freq: Every day | ORAL | Status: DC
  Administered 2014-10-17: 20 mg via ORAL
  Filled 2014-10-16 (×3): qty 1

## 2014-10-16 MED ORDER — ALBUTEROL SULFATE (2.5 MG/3ML) 0.083% IN NEBU
3.0000 mL | INHALATION_SOLUTION | Freq: Four times a day (QID) | RESPIRATORY_TRACT | Status: DC | PRN
  Administered 2014-10-17: 3 mL via RESPIRATORY_TRACT
  Filled 2014-10-16: qty 3

## 2014-10-16 MED ORDER — ALBUTEROL SULFATE (2.5 MG/3ML) 0.083% IN NEBU
5.0000 mg | INHALATION_SOLUTION | Freq: Once | RESPIRATORY_TRACT | Status: AC
  Administered 2014-10-16: 5 mg via RESPIRATORY_TRACT
  Filled 2014-10-16: qty 6

## 2014-10-16 MED ORDER — CLOPIDOGREL BISULFATE 75 MG PO TABS
75.0000 mg | ORAL_TABLET | Freq: Every morning | ORAL | Status: DC
  Administered 2014-10-17 – 2014-10-18 (×2): 75 mg via ORAL
  Filled 2014-10-16 (×2): qty 1

## 2014-10-16 MED ORDER — INSULIN ASPART 100 UNIT/ML ~~LOC~~ SOLN
8.0000 [IU] | Freq: Once | SUBCUTANEOUS | Status: AC
  Administered 2014-10-16: 8 [IU] via SUBCUTANEOUS

## 2014-10-16 MED ORDER — SODIUM CHLORIDE 0.9 % IV BOLUS (SEPSIS)
1000.0000 mL | Freq: Once | INTRAVENOUS | Status: AC
  Administered 2014-10-16: 1000 mL via INTRAVENOUS

## 2014-10-16 MED ORDER — ENOXAPARIN SODIUM 30 MG/0.3ML ~~LOC~~ SOLN
30.0000 mg | Freq: Every day | SUBCUTANEOUS | Status: DC
  Administered 2014-10-17: 30 mg via SUBCUTANEOUS
  Filled 2014-10-16 (×2): qty 0.3

## 2014-10-16 MED ORDER — LEVOTHYROXINE SODIUM 125 MCG PO TABS
125.0000 ug | ORAL_TABLET | Freq: Every day | ORAL | Status: DC
  Administered 2014-10-17 – 2014-10-18 (×2): 125 ug via ORAL
  Filled 2014-10-16 (×3): qty 1

## 2014-10-16 MED ORDER — ONDANSETRON HCL 4 MG/2ML IJ SOLN: 4.0000 mg | Freq: Four times a day (QID) | INTRAMUSCULAR | Status: DC | PRN

## 2014-10-16 MED ORDER — INSULIN ASPART 100 UNIT/ML ~~LOC~~ SOLN: 0.0000 [IU] | Freq: Three times a day (TID) | SUBCUTANEOUS | Status: DC

## 2014-10-16 MED ORDER — FERROUS SULFATE 325 (65 FE) MG PO TABS
325.0000 mg | ORAL_TABLET | ORAL | Status: DC
  Administered 2014-10-18: 325 mg via ORAL
  Filled 2014-10-16 (×2): qty 1

## 2014-10-16 MED ORDER — MAGNESIUM OXIDE 400 (241.3 MG) MG PO TABS
800.0000 mg | ORAL_TABLET | Freq: Every day | ORAL | Status: DC
  Administered 2014-10-16 – 2014-10-17 (×2): 800 mg via ORAL
  Filled 2014-10-16 (×3): qty 2

## 2014-10-16 MED ORDER — FENTANYL CITRATE (PF) 100 MCG/2ML IJ SOLN
50.0000 ug | Freq: Once | INTRAMUSCULAR | Status: AC
  Administered 2014-10-16: 50 ug via INTRAVENOUS
  Filled 2014-10-16: qty 2

## 2014-10-16 MED ORDER — PANTOPRAZOLE SODIUM 40 MG PO TBEC
40.0000 mg | DELAYED_RELEASE_TABLET | Freq: Every day | ORAL | Status: DC
  Administered 2014-10-17 – 2014-10-18 (×2): 40 mg via ORAL
  Filled 2014-10-16 (×2): qty 1

## 2014-10-16 MED ORDER — INSULIN ASPART 100 UNIT/ML ~~LOC~~ SOLN: 8.0000 [IU] | Freq: Once | SUBCUTANEOUS | Status: DC

## 2014-10-16 NOTE — ED Provider Notes (Signed)
Medical screening examination/treatment/procedure(s) were conducted as a shared visit with non-physician practitioner(s) and myself.  I personally evaluated the patient during the encounter.  Safely-year-old likely pyelonephritis also with hyperkalemia. That exam with some CVA tenderness nausea. Abdomen benign, respiratory exam is benign. Will be admitted for IV foods, antibiotics and hyperkalemia treatment.   EKG Interpretation   Date/Time:  Wednesday October 16 2014 16:03:56 EDT Ventricular Rate:  62 PR Interval:  183 QRS Duration: 85 QT Interval:  466 QTC Calculation: 473 R Axis:   80 Text Interpretation:  Sinus rhythm Repol abnrm suggests ischemia, diffuse  leads ST changes  in V2-6 similar to previous but improved Confirmed by  [facility] MD, Kaya Klausing [phone]) on 10/16/2014 4:16:16 PM        Merrily Pew, MD 10/17/14 MP:[id number]

## 2014-10-16 NOTE — ED Notes (Signed)
Pt is having chills and back pain,  Dr Olevia Bowens at bedside and aware,  Pt's temp has gone up ,  Pt is also nauseated

## 2014-10-16 NOTE — ED Notes (Addendum)
Sudden onset of right flank pain starting today. States over the last week her urine has been getting darker. Hx of kidney infections/failure. States it feels like the last time she had a kidney infection. Denies N/V and fever. States she's also been having occasional bouts of diarrhea over the last couple months. States she took a hydrocodone for her arthritis at around 11:30, and felt as though she may have had a fever at [employer] this afternoon when symptoms started.

## 2014-10-16 NOTE — ED Provider Notes (Signed)
CSN: [id number]     Arrival date & time 10/16/14  19 History   First MD Initiated Contact with Patient 10/16/14 1535     Chief Complaint  Patient presents with  . Back Pain  . Urinary Tract Infection    (Consider location/radiation/quality/duration/timing/severity/associated sxs/prior Treatment) HPI Comments: 74 year old female with a history of diabetes mellitus, CHF, CAD, AAA, HTN, HLD, and ICM (LVEF 50-55% in 2012) presents to the ED for c/o back pain. Patient c/o a constant pain in her R flank which began today and has been worsening over the course of the afternoon. Patient states that she took a hydrocodone tablet at 1130 for symptoms with some mild improvement. She states that she has worsening pain when she is upright and ambulatory. She states that the last time she had similar pain to the pain she is experiencing today was when she had a severe kidney infection. She states that she has arthritis in her back, but this pain is usually intermittent rather than constant. Patient reports preceding symptoms of worsening urinary odor and darkening of her urine color. Daughter states that patient often rapidly declines in mentation with bad urinary tract infections and she has been noticing the patient repeating herself and acting a bit more confused today. PMHx significant for frequent UTIs and kidney failure. Patient was on chronic abx for this until April 2016 when she was weaned off by her urologist, Dr. Janice Norrie.  PCP - Dr. Dwyane Dee.  Patient is a 74 y.o. female presenting with back pain and urinary tract infection. The history is provided by the patient. No language interpreter was used.  Back Pain Associated symptoms: abdominal pain (suprapubic)   Associated symptoms: no chest pain, no dysuria, no fever, no numbness and no weakness   Urinary Tract Infection Associated symptoms: abdominal pain (suprapubic) and flank pain   Associated symptoms: no fever, no nausea and no vomiting     Past  Medical History  Diagnosis Date  . Diabetes mellitus without complication     type 2  . CHF (congestive heart failure)   . Coronary artery disease   . AAA (abdominal aortic aneurysm) 03/09/2012  . PVD (peripheral vascular disease)   . Carotid artery stenosis     carotid doppler 06/2012 - Right CCA/Bulb/ICA with chronic occlusion; L vertebral artery with abnormal blood flow; L Bulb/Prox ICA  s/p endarterectomy with mild fibrous plaque, 50% diameter reduction  . Hypertension   . Hyperlipidemia   . Hypothyroidism   . History of nuclear stress test 11/24/2011    lexiscan; nonischemic, low risk   . PAD (peripheral artery disease)     09/2010 LEAs - R ABI of 0.45, occluded fem-pop bypass graft, L ABI of 0.59 with occluded SFA; severe arterial insuff  . Ischemic cardiomyopathy     EF 03/2010 50-55%   Past Surgical History  Procedure Laterality Date  . Coronary artery bypass graft  1997    x6; internal mammary to LAD, SVG to ramus #1 & #2, SVG to OM, SVG to PDA,   . Abdominal hysterectomy  1986  . Tumor removal    . Coronary stent placement    . Femoral artery repair    . Cystoscopy w/ ureteral stent placement  03/10/2012    Procedure: CYSTOSCOPY WITH RETROGRADE PYELOGRAM/URETERAL STENT PLACEMENT;  Surgeon: Hanley Ben, MD;  Location: [facility];  Service: Urology;  Laterality: Left;  . Carotid endarterectomy Bilateral   . Aortobifemoral bypass grafting  1997    with R fem-pop  .  Cardiac catheterization  12/2009    occluded vein to OM & ramus branches, patent vein to PDA, patent LIMA to LAD (Dr. [name], [facility]) - later had thrombectomy of R fem-pop bypass ad R common femoral & profunda femoris artery (Dr. Oneida Alar)  . Transthoracic echocardiogram  03/25/2010    EF 99991111, LV systolic function low normal with mild inferoseptal hypocontractility; LA mildly dilated; mod MR; mild TR, RV systolic pressure elevated, mild pulm HTN; AV mildly sclerotic; mild pulm valve regurg; aortic root sclerosis/calcif     Family History  Problem Relation Age of Onset  . Congestive Heart Failure Mother   . Diabetes Mother   . Stroke Father   . Cancer Maternal Aunt     Breast cancer   Social History  Substance Use Topics  . Smoking status: Former Smoker -- 2.00 packs/day for 25 years    Quit date: 06/07/2009  . Smokeless tobacco: Never Used  . Alcohol Use: No   OB History    No data available      Review of Systems  Constitutional: Positive for diaphoresis. Negative for fever.  Respiratory: Negative for shortness of breath.   Cardiovascular: Negative for chest pain.  Gastrointestinal: Positive for abdominal pain (suprapubic) and diarrhea. Negative for nausea and vomiting.  Genitourinary: Positive for flank pain. Negative for dysuria, urgency, frequency, hematuria and difficulty urinating.       Negative for incontinence Positive for foul urinary odor Positive for change in urine color  Musculoskeletal: Positive for back pain.  Neurological: Negative for syncope, weakness and numbness.  All other systems reviewed and are negative.   Allergies  Review of patient's allergies indicates no known allergies.  Home Medications   Prior to Admission medications   Medication Sig Start Date End Date Taking? Authorizing Provider  albuterol (PROVENTIL HFA;VENTOLIN HFA) 108 (90 BASE) MCG/ACT inhaler Inhale 2 puffs into the lungs every 6 (six) hours as needed for wheezing. 02/22/14  Yes [name], MD  aspirin 81 MG chewable tablet Chew 162 mg by mouth at bedtime.    Yes Historical Provider, MD  calcium-vitamin D (OSCAL WITH D) 500-200 MG-UNIT per tablet Take 1 tablet by mouth every morning.   Yes Historical Provider, MD  clopidogrel (PLAVIX) 75 MG tablet Take 1 tablet (75 mg total) by mouth every morning. 03/05/14  Yes [name], MD  fenofibrate micronized (LOFIBRA) 67 MG capsule Take 1 capsule (67 mg total) by mouth daily before breakfast. 01/28/14  Yes [name], MD  ferrous sulfate 325 (65 FE) MG  tablet Take 325 mg by mouth 3 (three) times a week.    Yes Historical Provider, MD  furosemide (LASIX) 20 MG tablet Take 1 tablet daily 04/26/14  Yes [name], MD  glucose blood test strip TEST AS DIRECTED 3 TIMES DAILY 10/18/12  Yes [name], MD  HYDROcodone-acetaminophen (NORCO/VICODIN) 5-325 MG per tablet Take 2 tablets at bedtime 07/25/14  Yes [name], MD  insulin aspart (NOVOLOG) 100 UNIT/ML FlexPen Inject 8 units three times a day with meals 09/30/14  Yes [name], MD  Insulin Pen Needle (BD PEN NEEDLE NANO U/F) 32G X 4 MM MISC Use 3 needles per day 11/16/13  Yes [name], MD  latanoprost (XALATAN) 0.005 % ophthalmic solution Place 1 drop into both eyes at bedtime. 03/11/14  Yes Historical Provider, MD  LEVEMIR FLEXTOUCH 100 UNIT/ML Pen INJECT 18 UNITS AT BEDTIME Patient taking differently: Inject 22 units at bedtime. 03/13/14  Yes [name], MD  levothyroxine (SYNTHROID, LEVOTHROID)  125 MCG tablet Take 1 tablet (125 mcg total) by mouth daily. 01/28/14  Yes [name], MD  magnesium oxide (MAG-OX) 400 MG tablet Take 2 tablets (800 mg total) by mouth at bedtime. 06/05/14  Yes [name], MD  metoprolol succinate (TOPROL-XL) 25 MG 24 hr tablet TAKE 1/2 TABLET (=12.5 MG) EVERY MORNING 06/03/14  Yes [name], MD  omeprazole (PRILOSEC) 20 MG capsule TAKE 1 CAPSULE DAILY 02/11/14  Yes [name], MD  OVER THE COUNTER MEDICATION Place 1 patch onto the skin once as needed (pain.). Transdermal "aspirin" patch.   Yes Historical Provider, MD  rosuvastatin (CRESTOR) 20 MG tablet Take 1 tablet (20 mg total) by mouth daily. 05/27/14  Yes [name], MD  vitamin B-12 (CYANOCOBALAMIN) 1000 MCG tablet Take 1,000 mcg by mouth every morning.   Yes Historical Provider, MD  vitamin C (ASCORBIC ACID) 500 MG tablet Take 500 mg by mouth every morning.   Yes Historical Provider, MD  Vitamin D, Ergocalciferol, (DRISDOL) 50000 UNITS CAPS capsule Take 1 capsule (50,000 Units total) by mouth every 7 (seven) days. On  thursdays 08/20/14  Yes [name], MD  clopidogrel (PLAVIX) 75 MG tablet TAKE 1 TABLET EVERY MORNING Patient not taking: Reported on 10/16/2014 06/10/14   [name], MD  diphenoxylate-atropine (LOMOTIL) 2.5-0.025 MG per tablet Take 1 tablet by mouth 3 (three) times daily. Patient not taking: Reported on 10/16/2014 09/05/14   [name], MD   BP 149/79 mmHg  Pulse 64  Temp(Src) 98.2 F (36.8 C) (Oral)  Resp 20  SpO2 95%   Physical Exam  Constitutional: She is oriented to person, place, and time. She appears well-developed and well-nourished. No distress.  Patient is pleasant and nontoxic/nonseptic appearing  HENT:  Head: Normocephalic and atraumatic.  Eyes: Conjunctivae and EOM are normal. No scleral icterus.  Neck: Normal range of motion.  Cardiovascular: Normal rate, regular rhythm and intact distal pulses.   Pulmonary/Chest: Effort normal and breath sounds normal. No respiratory distress. She has no wheezes. She has no rales.  Respirations even and unlabored  Abdominal: Soft. She exhibits no distension. There is tenderness in the suprapubic area. There is no rebound and no guarding.  Soft abdomen without masses or peritoneal signs  Musculoskeletal: Normal range of motion.  No pitting edema  Neurological: She is alert and oriented to person, place, and time. She exhibits normal muscle tone. Coordination normal.  Skin: Skin is warm and dry. No rash noted. She is not diaphoretic. No erythema. No pallor.  Psychiatric: She has a normal mood and affect. Her behavior is normal.  Nursing note and vitals reviewed.   ED Course  Procedures (including critical care time) Labs Review Labs Reviewed  URINALYSIS, ROUTINE W REFLEX MICROSCOPIC (NOT AT [facility]) - Abnormal; Notable for the following:    Color, Urine AMBER (*)    APPearance TURBID (*)    Glucose, UA 100 (*)    Hgb urine dipstick MODERATE (*)    Bilirubin Urine SMALL (*)    Protein, ur 100 (*)    Leukocytes, UA LARGE (*)    All  other components within normal limits  BASIC METABOLIC PANEL - Abnormal; Notable for the following:    Sodium 132 (*)    Potassium 5.8 (*)    Chloride 100 (*)    Glucose, Bld 270 (*)    BUN 43 (*)    Creatinine, Ser 2.31 (*)    GFR calc non Af Amer 20 (*)    GFR calc Af  Amer 23 (*)    All other components within normal limits  CBC - Abnormal; Notable for the following:    WBC 15.9 (*)    RBC 3.81 (*)    Hemoglobin 11.0 (*)    HCT 35.2 (*)    All other components within normal limits  POTASSIUM - Abnormal; Notable for the following:    Potassium 5.5 (*)    All other components within normal limits  URINE MICROSCOPIC-ADD ON - Abnormal; Notable for the following:    Squamous Epithelial / LPF FEW (*)    Bacteria, UA MANY (*)    All other components within normal limits  URINE CULTURE  I-STAT TROPOININ, ED    Imaging Review No results found.   EKG Interpretation   Date/Time:  Wednesday October 16 2014 16:03:56 EDT Ventricular Rate:  62 PR Interval:  183 QRS Duration: 85 QT Interval:  466 QTC Calculation: 473 R Axis:   80 Text Interpretation:  Sinus rhythm Repol abnrm suggests ischemia, diffuse  leads ST changes  in V2-6 similar to previous but improved Confirmed by  [facility] MD, Corene Cornea [phone]) on 10/16/2014 4:16:16 PM       Medications  sodium chloride 0.9 % bolus 1,000 mL (not administered)  cefTRIAXone (ROCEPHIN) 1 g in dextrose 5 % 50 mL IVPB (not administered)  albuterol (PROVENTIL) (2.5 MG/3ML) 0.083% nebulizer solution 5 mg (5 mg Nebulization Given 10/16/14 1811)  insulin aspart (novoLOG) injection 8 Units (8 Units Subcutaneous Given 10/16/14 1807)    MDM   Final diagnoses:  Pyelonephritis  Hyperkalemia  AKI (acute kidney injury)    75 year old female presents to the emergency department for constant back pain beginning today with preceding urinary symptoms. Clinical picture consistent with pyelonephritis. Patient to be given IV Rocephin when IV access  obtained. Patient given albuterol treatment as well as subcutaneous insulin for mild hyperkalemia of 5.8. Will admit to [facility] for pyelonephritis in the setting of AKI. Patient is not a concern for sepsis at this time.   Filed Vitals:   10/16/14 1324 10/16/14 1720  BP: 99/66 149/79  Pulse: 79 64  Temp: 98.2 F (36.8 C)   TempSrc: Oral   Resp: 18 20  SpO2: 95% 95%     Antonietta Breach, PA-C 10/16/14 1827  Merrily Pew, MD 10/17/14 [phone]

## 2014-10-16 NOTE — H&P (Signed)
[facility] History and Physical  [name] [name] [id number] DOB: [birth year] DOA: 10/16/2014  Referring physician: Antonietta Breach, PA-C PCP: [name], MD   Chief Complaint: Lower back pain due to urinary tract infection.  HPI: [name] [name] is a 74 y.o. female with a past medical history of CAD, CABG, compensated CHF, carotid artery stenosis, AAA, peripheral vascular disease, hypertension, type 2 diabetes, hyperlipidemia, hypothyroidism, who comes to the ER with her daughter due to worsening right flank pain that she says she has been experiencing for several days, but very intense this morning to the point that she had to take this morning at home one of her hydrocodone/acetaminophen tablets, which only provided mild relief.   The patient stated that she has been having pain for at least 6 days, but not as intense as they was this morning. She used pain patch which seemed to help some. Last evening, she noticed she was having more pain and took 2 of her hydrocodone/acetaminophen tablets and was able to sleep.  She denies fever or chills, but thinks she had night sweats last night and complains of associated suprapubic tenderness, frequency, urgency and mild dysuria. She also stated that she had 2 episodes of diarrhea, but denies chest pain, palpitations, dizziness, diaphoresis, PVD mouth the lower extremities, PND or orthopnea. She is currently in no acute distress and stated that the analgesic given earlier improved her pain significantly.   Review of Systems:  Constitutional:  Positive night sweats, No weight loss,  Fevers, chills, fatigue.  HEENT:  No headaches, Difficulty swallowing,Tooth/dental problems,Sore throat,  No sneezing, itching, ear ache, nasal congestion, post nasal drip,  Cardio-vascular:  No chest pain, Orthopnea, PND, swelling in lower extremities, anasarca, dizziness, palpitations  GI: Positive diarrhea, positive oss of appetite  No heartburn,  indigestion, abdominal pain, nausea, vomiting,  change in bowel habits, l Resp:  No shortness of breath with exertion or at rest. No excess mucus, no productive cough, No non-productive cough, No coughing up of blood.No change in color of mucus.No wheezing.No chest wall deformity  Skin:  no rash or lesions.  GU:  Mild dysuria, positive change in color of urine, positive urgency or frequency. Positive flank pain.  Musculoskeletal:  No joint pain or swelling. No decreased range of motion. No back pain.  Psych:  No change in mood or affect. No depression or anxiety. No memory loss.   Past Medical History  Diagnosis Date  . Diabetes mellitus without complication     type 2  . CHF (congestive heart failure)   . Coronary artery disease   . AAA (abdominal aortic aneurysm) 03/09/2012  . PVD (peripheral vascular disease)   . Carotid artery stenosis     carotid doppler 06/2012 - Right CCA/Bulb/ICA with chronic occlusion; L vertebral artery with abnormal blood flow; L Bulb/Prox ICA  s/p endarterectomy with mild fibrous plaque, 50% diameter reduction  . Hypertension   . Hyperlipidemia   . Hypothyroidism   . History of nuclear stress test 11/24/2011    lexiscan; nonischemic, low risk   . PAD (peripheral artery disease)     09/2010 LEAs - R ABI of 0.45, occluded fem-pop bypass graft, L ABI of 0.59 with occluded SFA; severe arterial insuff  . Ischemic cardiomyopathy     EF 03/2010 50-55%   Past Surgical History  Procedure Laterality Date  . Coronary artery bypass graft  1997    x6; internal mammary to LAD, SVG to ramus #1 & #2, SVG to OM, SVG  to PDA,   . Abdominal hysterectomy  1986  . Tumor removal    . Coronary stent placement    . Femoral artery repair    . Cystoscopy w/ ureteral stent placement  03/10/2012    Procedure: CYSTOSCOPY WITH RETROGRADE PYELOGRAM/URETERAL STENT PLACEMENT;  Surgeon: Hanley Ben, MD;  Location: [facility];  Service: Urology;  Laterality: Left;  . Carotid endarterectomy  Bilateral   . Aortobifemoral bypass grafting  1997    with R fem-pop  . Cardiac catheterization  12/2009    occluded vein to OM & ramus branches, patent vein to PDA, patent LIMA to LAD (Dr. [name], [facility]) - later had thrombectomy of R fem-pop bypass ad R common femoral & profunda femoris artery (Dr. Oneida Alar)  . Transthoracic echocardiogram  03/25/2010    EF 99991111, LV systolic function low normal with mild inferoseptal hypocontractility; LA mildly dilated; mod MR; mild TR, RV systolic pressure elevated, mild pulm HTN; AV mildly sclerotic; mild pulm valve regurg; aortic root sclerosis/calcif    Social History:  reports that she quit smoking about 5 years ago. She has never used smokeless tobacco. She reports that she does not drink alcohol or use illicit drugs.  No Known Allergies  Family History  Problem Relation Age of Onset  . Congestive Heart Failure Mother   . Diabetes Mother   . Stroke Father   . Cancer Maternal Aunt     Breast cancer    Prior to Admission medications   Medication Sig Start Date End Date Taking? Authorizing Provider  albuterol (PROVENTIL HFA;VENTOLIN HFA) 108 (90 BASE) MCG/ACT inhaler Inhale 2 puffs into the lungs every 6 (six) hours as needed for wheezing. 02/22/14  Yes [name], MD  aspirin 81 MG chewable tablet Chew 162 mg by mouth at bedtime.    Yes Historical Provider, MD  calcium-vitamin D (OSCAL WITH D) 500-200 MG-UNIT per tablet Take 1 tablet by mouth every morning.   Yes Historical Provider, MD  clopidogrel (PLAVIX) 75 MG tablet Take 1 tablet (75 mg total) by mouth every morning. 03/05/14  Yes [name], MD  fenofibrate micronized (LOFIBRA) 67 MG capsule Take 1 capsule (67 mg total) by mouth daily before breakfast. 01/28/14  Yes [name], MD  ferrous sulfate 325 (65 FE) MG tablet Take 325 mg by mouth 3 (three) times a week.    Yes Historical Provider, MD  furosemide (LASIX) 20 MG tablet Take 1 tablet daily 04/26/14  Yes [name], MD  glucose blood test  strip TEST AS DIRECTED 3 TIMES DAILY 10/18/12  Yes [name], MD  HYDROcodone-acetaminophen (NORCO/VICODIN) 5-325 MG per tablet Take 2 tablets at bedtime 07/25/14  Yes [name], MD  insulin aspart (NOVOLOG) 100 UNIT/ML FlexPen Inject 8 units three times a day with meals 09/30/14  Yes [name], MD  Insulin Pen Needle (BD PEN NEEDLE NANO U/F) 32G X 4 MM MISC Use 3 needles per day 11/16/13  Yes [name], MD  latanoprost (XALATAN) 0.005 % ophthalmic solution Place 1 drop into both eyes at bedtime. 03/11/14  Yes Historical Provider, MD  LEVEMIR FLEXTOUCH 100 UNIT/ML Pen INJECT 18 UNITS AT BEDTIME Patient taking differently: Inject 22 units at bedtime. 03/13/14  Yes [name], MD  levothyroxine (SYNTHROID, LEVOTHROID) 125 MCG tablet Take 1 tablet (125 mcg total) by mouth daily. 01/28/14  Yes [name], MD  magnesium oxide (MAG-OX) 400 MG tablet Take 2 tablets (800 mg total) by mouth at bedtime. 06/05/14  Yes [name], MD  metoprolol  succinate (TOPROL-XL) 25 MG 24 hr tablet TAKE 1/2 TABLET (=12.5 MG) EVERY MORNING 06/03/14  Yes [name], MD  omeprazole (PRILOSEC) 20 MG capsule TAKE 1 CAPSULE DAILY 02/11/14  Yes [name], MD  OVER THE COUNTER MEDICATION Place 1 patch onto the skin once as needed (pain.). Transdermal "aspirin" patch.   Yes Historical Provider, MD  rosuvastatin (CRESTOR) 20 MG tablet Take 1 tablet (20 mg total) by mouth daily. 05/27/14  Yes [name], MD  vitamin B-12 (CYANOCOBALAMIN) 1000 MCG tablet Take 1,000 mcg by mouth every morning.   Yes Historical Provider, MD  vitamin C (ASCORBIC ACID) 500 MG tablet Take 500 mg by mouth every morning.   Yes Historical Provider, MD  Vitamin D, Ergocalciferol, (DRISDOL) 50000 UNITS CAPS capsule Take 1 capsule (50,000 Units total) by mouth every 7 (seven) days. On thursdays 08/20/14  Yes [name], MD  clopidogrel (PLAVIX) 75 MG tablet TAKE 1 TABLET EVERY MORNING Patient not taking: Reported on 10/16/2014 06/10/14   [name], MD    diphenoxylate-atropine (LOMOTIL) 2.5-0.025 MG per tablet Take 1 tablet by mouth 3 (three) times daily. Patient not taking: Reported on 10/16/2014 09/05/14   [name], MD   Physical Exam: Filed Vitals:   10/16/14 1324 10/16/14 1720  BP: 99/66 149/79  Pulse: 79 64  Temp: 98.2 F (36.8 C)   TempSrc: Oral   Resp: 18 20  SpO2: 95% 95%    Wt Readings from Last 3 Encounters:  07/25/14 77.111 kg (170 lb)  04/29/14 78.472 kg (173 lb)  01/28/14 80.287 kg (177 lb)    General:  Appears calm and comfortable. Eyes: PERRL, normal lids, irises & conjunctiva ENT: grossly normal hearing, lips & tongue are dry. Neck: no LAD, masses or thyromegaly Cardiovascular: RRR, no m/r/g. No LE edema. Telemetry: SR, no arrhythmias  Respiratory: CTA bilaterally, no w/r/r. Normal respiratory effort. Abdomen: Bowel sounds positive, soft, positive suprapubic tenderness. Back: No point tenderness on spine, right flank tenderness on percussion. Skin: no rash or induration seen on limited exam Musculoskeletal: grossly normal tone BUE/BLE Psychiatric: grossly normal mood and affect, speech fluent and appropriate Neurologic: grossly non-focal.          Labs on Admission:  Basic Metabolic Panel:  Recent Labs Lab 10/16/14 1344 10/16/14 1618  NA 132*  --   K 5.8* 5.5*  CL 100*  --   CO2 23  --   GLUCOSE 270*  --   BUN 43*  --   CREATININE 2.31*  --   CALCIUM 8.9  --    Liver Function Tests: No results for input(s): AST, ALT, ALKPHOS, BILITOT, PROT, ALBUMIN in the last 168 hours. No results for input(s): LIPASE, AMYLASE in the last 168 hours. No results for input(s): AMMONIA in the last 168 hours. CBC:  Recent Labs Lab 10/16/14 1344  WBC 15.9*  HGB 11.0*  HCT 35.2*  MCV 92.4  PLT 309    Radiological Exams on Admission: No results found.  EKG: Independently reviewed. Vent. rate 62 BPM PR interval 183 ms QRS duration 85 ms QT/QTc 466/473 ms P-R-T axes 69 80 153 Sinus rhythm Repol  abnrm suggests ischemia, diffuse leads ST changes in V2-6 similar to previous but improved.  Assessment/Plan Principal Problem:       Acute pyelonephritis Continue IV antibiotics, analgesics and gentle IV fluids. Follow-up urine culture and sensitivity.  Active Problems:       CAD (coronary artery disease) Stable and not symptomatic.  Continue aspirin, Plavix and beta  blocker.    Diabetes mellitus, type II Continue current long acting insulin, which the patient states he is currently at 22 units SQ every evening. CBG monitoring with regular insulin sliding scale while in the hospital.    HTN (hypertension) Continue current therapy.  Monitor blood pressure.    CHF (congestive heart failure) Compensated at this time.  Monitor input and output.    Chronic kidney disease, stage III (moderate) Monitor input and output. Follow-up BUN/creatinine.    Hyperlipidemia. Continue Crestor. Monitor LFTs.    Hyperkalemia.  Treated in the emergency department and improving. Monitor potassium levels.  Code Status: Full code. DVT Prophylaxis: Lovenox SQ. Family CommunicationJakhia, [place] Daughter  [phone]  Disposition Plan: IV antibiotics for 2-5 days, then discharge home with outpatient follow-up if admission is uneventful.  Time spent: Over 60 minutes.  Reubin Milan [facility] Pager [phone].

## 2014-10-16 NOTE — ED Notes (Signed)
Transported by Izora Gala RN

## 2014-10-17 DIAGNOSIS — N10 Acute tubulo-interstitial nephritis: Principal | ICD-10-CM

## 2014-10-17 DIAGNOSIS — A419 Sepsis, unspecified organism: Secondary | ICD-10-CM

## 2014-10-17 DIAGNOSIS — N179 Acute kidney failure, unspecified: Secondary | ICD-10-CM

## 2014-10-17 DIAGNOSIS — E875 Hyperkalemia: Secondary | ICD-10-CM

## 2014-10-17 LAB — COMPREHENSIVE METABOLIC PANEL
ALT: 25 U/L (ref 14–54)
AST: 53 U/L — AB (ref 15–41)
Albumin: 2.6 g/dL — ABNORMAL LOW (ref 3.5–5.0)
Alkaline Phosphatase: 43 U/L (ref 38–126)
Anion gap: 7 (ref 5–15)
BILIRUBIN TOTAL: 0.4 mg/dL (ref 0.3–1.2)
BUN: 39 mg/dL — ABNORMAL HIGH (ref 6–20)
CO2: 26 mmol/L (ref 22–32)
Calcium: 8.4 mg/dL — ABNORMAL LOW (ref 8.9–10.3)
Chloride: 104 mmol/L (ref 101–111)
Creatinine, Ser: 2.04 mg/dL — ABNORMAL HIGH (ref 0.44–1.00)
GFR calc Af Amer: 27 mL/min — ABNORMAL LOW (ref >60)
GFR, EST NON AFRICAN AMERICAN: 23 mL/min — AB (ref >60)
GLUCOSE: 122 mg/dL — AB (ref 65–99)
Potassium: 4.9 mmol/L (ref 3.5–5.1)
SODIUM: 137 mmol/L (ref 135–145)
Total Protein: 6.1 g/dL — ABNORMAL LOW (ref 6.5–8.1)

## 2014-10-17 LAB — CBC WITH DIFFERENTIAL/PLATELET
Basophils Absolute: 0 10*3/uL (ref 0.0–0.1)
Basophils Relative: 0 % (ref 0–1)
Eosinophils Absolute: 0 10*3/uL (ref 0.0–0.7)
Eosinophils Relative: 0 % (ref 0–5)
HEMATOCRIT: 32.1 % — AB (ref 36.0–46.0)
Hemoglobin: 10 g/dL — ABNORMAL LOW (ref 12.0–15.0)
LYMPHS PCT: 11 % — AB (ref 12–46)
Lymphs Abs: 0.7 10*3/uL (ref 0.7–4.0)
MCH: 28.7 pg (ref 26.0–34.0)
MCHC: 31.2 g/dL (ref 30.0–36.0)
MCV: 92 fL (ref 78.0–100.0)
MONO ABS: 0.4 10*3/uL (ref 0.1–1.0)
Monocytes Relative: 6 % (ref 3–12)
Neutro Abs: 5.3 10*3/uL (ref 1.7–7.7)
Neutrophils Relative %: 83 % — ABNORMAL HIGH (ref 43–77)
Platelets: 256 10*3/uL (ref 150–400)
RBC: 3.49 MIL/uL — ABNORMAL LOW (ref 3.87–5.11)
RDW: 14.1 % (ref 11.5–15.5)
WBC: 6.5 10*3/uL (ref 4.0–10.5)

## 2014-10-17 LAB — GLUCOSE, CAPILLARY
GLUCOSE-CAPILLARY: 97 mg/dL (ref 65–99)
Glucose-Capillary: 130 mg/dL — ABNORMAL HIGH (ref 65–99)
Glucose-Capillary: 125 mg/dL — ABNORMAL HIGH (ref 65–99)
Glucose-Capillary: 113 mg/dL — ABNORMAL HIGH (ref 65–99)

## 2014-10-17 MED ORDER — INSULIN ASPART 100 UNIT/ML ~~LOC~~ SOLN
6.0000 [IU] | Freq: Three times a day (TID) | SUBCUTANEOUS | Status: DC
  Administered 2014-10-17 – 2014-10-18 (×5): 6 [IU] via SUBCUTANEOUS

## 2014-10-17 MED ORDER — INSULIN ASPART 100 UNIT/ML FLEXPEN: 6.0000 [IU] | PEN_INJECTOR | Freq: Three times a day (TID) | SUBCUTANEOUS | Status: DC

## 2014-10-17 MED ORDER — HYDROCODONE-ACETAMINOPHEN 5-325 MG PO TABS
1.0000 | ORAL_TABLET | ORAL | Status: DC | PRN
  Administered 2014-10-17: 1 via ORAL
  Filled 2014-10-17: qty 1

## 2014-10-17 NOTE — Care Management Note (Signed)
Case Management Note  Patient Details  Name: [name] [name] MRN: [id number] Date of Birth: [birth year]  Subjective/Objective:            Acute plyonephritis        Action/Plan: Date:  October 17, 2014 U.R. performed for needs and level of care. Will continue to follow for Case Management needs.  Velva Harman, RN, BSN, [place]   [phone]  Expected Discharge Date:   (unknown)               Expected Discharge Plan:  Home/Self Care  In-House Referral:  NA  Discharge planning Services  CM Consult  Post Acute Care Choice:  NA Choice offered to:  NA  DME Arranged:  N/A DME Agency:  NA  HH Arranged:  NA HH Agency:  NA  Status of Service:  In process, will continue to follow  Medicare Important Message Given:    Date Medicare IM Given:    Medicare IM give by:    Date Additional Medicare IM Given:    Additional Medicare Important Message give by:     If discussed at [place] of Stay Meetings, dates discussed:    Additional Comments:  Leeroy Cha, RN 10/17/2014, 10:51 AM

## 2014-10-17 NOTE — Progress Notes (Signed)
[facility] Progress Note   [name] [name] [id number] DOB: Sep 22, 1940 DOA: 10/16/2014 PCP: [name], MD  Brief narrative: [name] [name] is a 74 y.o. female with a history of coronary artery disease status post CABG, congestive heart failure, carotid artery stenosis, abdominal aortic aneurysm, hypertension, type 2 diabetes mellitus and hypothyroidism. She presented with a complaint of several days of right-sided flank pain which was severe on the day of admission. She also complained of frequency urgency and mild dysuria and was noted to have suprapubic tenderness on exam. She also had 2 episodes of diarrhea at home. She used to hydrocodone the night before to help her sleep. She was found to have a positive UA and was admitted for pyelonephritis   Subjective: No right flank pain or suprapubic pain. No further chills/ fever. She had nausea last night but this has resolved. No vomiting or diarrhea.   Assessment/Plan: Principal Problem:   Acute pyelonephritis -Symptoms resolved after one dose of ceftriaxone-continue to follow culture is growing greater than 100,000 colonies of gram-negative rods  Active Problems:   Sepsis -Fever and leukocytosis in relation to above    Hyperkalemia -Possibly due to potassium replacement and acute renal failure -Given an albuterol treatment and subcutaneous insulin in the ER -Resolved-and hold potassium     CAD (coronary artery disease) -Continue aspirin and Plavix    Diabetes mellitus, type II -Continue mealtime NovoLog and Levemir    HTN (hypertension) -Continue metoprolol    CHF (congestive heart failure) -Currently compensated-hold Lasix and hold IV fluids-follow I and O and daily weights   Acute on Chronic kidney disease, stage III (moderate) -Baseline creatinine is about 1.7- 1.8 -Holding Lasix-she did receive fluids through the night and I'm holding them now-continue to follow creatinine    Hyperlipidemia -Continue  statin    Appt with PCP: she has an appt with Dr Dwyane Dee for 8/19 Code Status: Full code Family Communication:  Disposition Plan: d/c home in 1-2 days DVT prophylaxis: Lovenox Consultants: Procedures:  Antibiotics: Anti-infectives    Start     Dose/Rate Route Frequency Ordered Stop   10/17/14 1800  cefTRIAXone (ROCEPHIN) 1 g in dextrose 5 % 50 mL IVPB     1 g 100 mL/hr over 30 Minutes Intravenous Every 24 hours 10/16/14 2126     10/16/14 1800  cefTRIAXone (ROCEPHIN) 1 g in dextrose 5 % 50 mL IVPB     1 g 100 mL/hr over 30 Minutes Intravenous  Once 10/16/14 1754 10/16/14 2113      Objective: Filed Weights   10/16/14 2100  Weight: 75.841 kg (167 lb 3.2 oz)    Intake/Output Summary (Last 24 hours) at 10/17/14 0801 Last data filed at 10/17/14 0600  Gross per 24 hour  Intake    525 ml  Output      0 ml  Net    525 ml     Vitals Filed Vitals:   10/16/14 2008 10/16/14 2100 10/16/14 2144 10/17/14 0605  BP:   164/56 160/53  Pulse:   94 72  Temp: 99.4 F (37.4 C)  100.9 F (38.3 C) 97.5 F (36.4 C)  TempSrc: Oral  Oral Oral  Resp:   20 18  Height:  5\' 6"  (1.676 m)    Weight:  75.841 kg (167 lb 3.2 oz)    SpO2:   99% 100%    Exam:  General:  Pt is alert, not in acute distress  HEENT: No icterus, No thrush, oral mucosa moist  Cardiovascular:  regular rate and rhythm, S1/S2 No murmur  Respiratory: clear to auscultation bilaterally   Abdomen: Soft, +Bowel sounds, non tender, non distended, no guarding  MSK: No LE edema, cyanosis or clubbing  Data Reviewed: Basic Metabolic Panel:  Recent Labs Lab 10/16/14 1344 10/16/14 1618 10/17/14 0555  NA 132*  --  137  K 5.8* 5.5* 4.9  CL 100*  --  104  CO2 23  --  26  GLUCOSE 270*  --  122*  BUN 43*  --  39*  CREATININE 2.31*  --  2.04*  CALCIUM 8.9  --  8.4*   Liver Function Tests:  Recent Labs Lab 10/17/14 0555  AST 53*  ALT 25  ALKPHOS 43  BILITOT 0.4  PROT 6.1*  ALBUMIN 2.6*   No results for  input(s): LIPASE, AMYLASE in the last 168 hours. No results for input(s): AMMONIA in the last 168 hours. CBC:  Recent Labs Lab 10/16/14 1344 10/17/14 0555  WBC 15.9* 6.5  NEUTROABS  --  5.3  HGB 11.0* 10.0*  HCT 35.2* 32.1*  MCV 92.4 92.0  PLT 309 256   Cardiac Enzymes: No results for input(s): CKTOTAL, CKMB, CKMBINDEX, TROPONINI in the last 168 hours. BNP (last 3 results) No results for input(s): BNP in the last 8760 hours.  ProBNP (last 3 results) No results for input(s): PROBNP in the last 8760 hours.  CBG:  Recent Labs Lab 10/16/14 2147 10/17/14 0718  GLUCAP 141* 130*    No results found for this or any previous visit (from the past 240 hour(s)).   Studies: No results found.  Scheduled Meds:  Scheduled Meds: . aspirin  162 mg Oral Daily  . calcium-vitamin D  1 tablet Oral q morning - 10a  . cefTRIAXone (ROCEPHIN)  IV  1 g Intravenous Q24H  . clopidogrel  75 mg Oral q morning - 10a  . enoxaparin (LOVENOX) injection  30 mg Subcutaneous Daily  . [START ON 10/18/2014] ferrous sulfate  325 mg Oral Once per day on Mon Wed Fri  . insulin aspart  0-9 Units Subcutaneous TID WC  . insulin detemir  22 Units Subcutaneous Q2200  . latanoprost  1 drop Both Eyes QHS  . levothyroxine  125 mcg Oral QAC breakfast  . magnesium oxide  800 mg Oral QHS  . metoprolol succinate  12.5 mg Oral Daily  . pantoprazole  40 mg Oral Daily  . rosuvastatin  20 mg Oral Daily  . vitamin B-12  1,000 mcg Oral q morning - 10a  . vitamin C  500 mg Oral q morning - 10a   Continuous Infusions: . sodium chloride 50 mL/hr at 10/16/14 2154    Time spent on care of this patient: 60 min   [place], MD 10/17/2014, 8:01 AM  LOS: 1 day   [facility] Office  [phone] Pager - Text Page per [email or web address] If 7PM-7AM, please contact night-coverage [email or web address]

## 2014-10-17 NOTE — Progress Notes (Signed)
Initial Nutrition Assessment  DOCUMENTATION CODES:   Not applicable  INTERVENTION:  - Continue Carb Modified diet - RD will continue to monitor for needs  NUTRITION DIAGNOSIS:   Increased nutrient needs related to acute illness as evidenced by estimated needs, other (see comment) (increased fluid needs due to diarrhea PTA).  GOAL:   Patient will meet greater than or equal to 90% of their needs  MONITOR:   PO intake, Weight trends, Labs, I & O's  REASON FOR ASSESSMENT:   Malnutrition Screening Tool  ASSESSMENT:   74 y.o. female with a past medical history of CAD, CABG, compensated CHF, carotid artery stenosis, AAA, peripheral vascular disease, hypertension, type 2 diabetes, hyperlipidemia, hypothyroidism, who comes to the ER with her daughter due to worsening right flank pain that she says she has been experiencing for several days, but very intense this morning to the point that she had to take this morning at home one of her hydrocodone/acetaminophen tablets, which only provided mild relief.   Pt seen for MST. BMI indicates overweight status. Pt reports that she had a very good appetite but that starting ~1 month ago she began having diarrhea which was often worse after intakes which made her "afraid" to eat. She states that diarrhea originally lasted for 10 days and then she was prescribed medication by PCP which helped resolve it for 3-4 days and that current episode has been lasting 2 weeks.  She states that she felt it may be related to the foods she was eating. She stopped eating cucumbers recently as she had previously been eating them frequently and she states diarrhea has improved since she has stopped eating cucumbers.  Pt denies chewing or swallowing issues. She was not drinking nutrition supplements such as Ensure or Boost at home but was taking many supplements including: multivitamin, B vitamin complex, vitamin D, iron, and calcium  Pt reports that she does not weigh  herself at home but that she weighed 175 lbs when she was at the doctor's office 3 months ago. This would indicate 8 lb weight loss (5% body weight) in 3 months which is not significant for time frame.  Chart review indicates pt ate 80% of breakfast this AM. No muscle or fat wasting noted. Expect pt to meet needs as diarrhea continues to resolve. Medications reviewed. Labs reviewed; CBGs: 130 and 141 mg/dL, BUN/creatinine elevated, Ca: 8.4 mg/dL, GFR: 23.   Diet Order:  Diet Carb Modified Fluid consistency:: Thin; Room service appropriate?: Yes  Skin:  Reviewed, no issues  Last BM:  8/10  Height:   Ht Readings from Last 1 Encounters:  10/16/14 5\' 6"  (1.676 m)    Weight:   Wt Readings from Last 1 Encounters:  10/16/14 167 lb 3.2 oz (75.841 kg)    Ideal Body Weight:  59.09 kg (kg)  BMI:  Body mass index is 27 kg/(m^2).  Estimated Nutritional Needs:   Kcal:  1400-1600  Protein:  60-70 grams  Fluid:  2.5-2.7 L/day  EDUCATION NEEDS:   No education needs identified at this time     Jarome Matin, RD, LDN Inpatient Clinical Dietitian Pager # [phone] After hours/weekend pager # [phone]

## 2014-10-17 NOTE — Progress Notes (Signed)
Inpatient Diabetes Program Recommendations  AACE/ADA: New Consensus Statement on Inpatient Glycemic Control (2013)  Target Ranges:  Prepandial:   less than 140 mg/dL      Peak postprandial:   less than 180 mg/dL (1-2 hours)      Critically ill patients:  140 - 180 mg/dL    Admit: UTI  History of: DM, CKD, PVD, CHF  Home DM Meds: Levemir 22 units QHS        Novolog 8 units tidwc  Current Orders: Levemir 22 units QHS     Novolog 6 units tidwc     MD- Please consider adding Novolog Sensitive SSI (0-9 units) TID AC + HS     Will follow Wyn Quaker RN, MSN, CDE Diabetes Coordinator Inpatient Glycemic Control Team Team Pager: [phone] (8a-5p)

## 2014-10-18 DIAGNOSIS — I5041 Acute combined systolic (congestive) and diastolic (congestive) heart failure: Secondary | ICD-10-CM

## 2014-10-18 LAB — COMPREHENSIVE METABOLIC PANEL
ALBUMIN: 2.8 g/dL — AB (ref 3.5–5.0)
ALK PHOS: 48 U/L (ref 38–126)
ALT: 34 U/L (ref 14–54)
AST: 69 U/L — AB (ref 15–41)
Anion gap: 9 (ref 5–15)
BILIRUBIN TOTAL: 0.2 mg/dL — AB (ref 0.3–1.2)
BUN: 32 mg/dL — ABNORMAL HIGH (ref 6–20)
CHLORIDE: 107 mmol/L (ref 101–111)
CO2: 22 mmol/L (ref 22–32)
Calcium: 8.9 mg/dL (ref 8.9–10.3)
Creatinine, Ser: 1.71 mg/dL — ABNORMAL HIGH (ref 0.44–1.00)
GFR calc Af Amer: 33 mL/min — ABNORMAL LOW (ref >60)
GFR calc non Af Amer: 28 mL/min — ABNORMAL LOW (ref >60)
Glucose, Bld: 67 mg/dL (ref 65–99)
POTASSIUM: 5 mmol/L (ref 3.5–5.1)
SODIUM: 138 mmol/L (ref 135–145)
TOTAL PROTEIN: 6.4 g/dL — AB (ref 6.5–8.1)

## 2014-10-18 LAB — CBC WITH DIFFERENTIAL/PLATELET
Basophils Absolute: 0 10*3/uL (ref 0.0–0.1)
Basophils Relative: 0 % (ref 0–1)
EOS PCT: 3 % (ref 0–5)
Eosinophils Absolute: 0.2 10*3/uL (ref 0.0–0.7)
HCT: 35.2 % — ABNORMAL LOW (ref 36.0–46.0)
Hemoglobin: 10.7 g/dL — ABNORMAL LOW (ref 12.0–15.0)
LYMPHS ABS: 1.1 10*3/uL (ref 0.7–4.0)
Lymphocytes Relative: 16 % (ref 12–46)
MCH: 28 pg (ref 26.0–34.0)
MCHC: 30.4 g/dL (ref 30.0–36.0)
MCV: 92.1 fL (ref 78.0–100.0)
Monocytes Absolute: 0.7 10*3/uL (ref 0.1–1.0)
Monocytes Relative: 10 % (ref 3–12)
Neutro Abs: 5 10*3/uL (ref 1.7–7.7)
Neutrophils Relative %: 71 % (ref 43–77)
Platelets: 250 10*3/uL (ref 150–400)
RBC: 3.82 MIL/uL — ABNORMAL LOW (ref 3.87–5.11)
RDW: 13.9 % (ref 11.5–15.5)
WBC: 7 10*3/uL (ref 4.0–10.5)

## 2014-10-18 LAB — GLUCOSE, CAPILLARY
GLUCOSE-CAPILLARY: 117 mg/dL — AB (ref 65–99)
GLUCOSE-CAPILLARY: 90 mg/dL (ref 65–99)

## 2014-10-18 MED ORDER — CEFDINIR 125 MG/5ML PO SUSR
300.0000 mg | Freq: Two times a day (BID) | ORAL | Status: DC
  Filled 2014-10-18: qty 15

## 2014-10-18 MED ORDER — FUROSEMIDE 10 MG/ML IJ SOLN
20.0000 mg | Freq: Once | INTRAMUSCULAR | Status: AC
  Administered 2014-10-18: 20 mg via INTRAVENOUS
  Filled 2014-10-18: qty 2

## 2014-10-18 MED ORDER — INSULIN DETEMIR 100 UNIT/ML FLEXPEN: PEN_INJECTOR | SUBCUTANEOUS | Status: DC

## 2014-10-18 MED ORDER — CEFDINIR 300 MG PO CAPS
300.0000 mg | ORAL_CAPSULE | Freq: Two times a day (BID) | ORAL | Status: DC
Start: 2014-10-18 — End: 2015-01-02

## 2014-10-18 NOTE — Progress Notes (Signed)
Inpatient Diabetes Program Recommendations  AACE/ADA: New Consensus Statement on Inpatient Glycemic Control (2013)  Target Ranges:  Prepandial:   less than 140 mg/dL      Peak postprandial:   less than 180 mg/dL (1-2 hours)      Critically ill patients:  140 - 180 mg/dL    Results for [name] [name], [name] [name] (MRN [id number]) as of 10/18/2014 09:24  Ref. Range 10/18/2014 05:22  Glucose Latest Ref Range: 65-99 mg/dL 67   Home DM Meds: Levemir 22 units QHS   Novolog 8 units tidwc  Current Orders: Levemir 22 units QHS  Novolog 6 units tidwc    -Mild Hypoglycemia on AM lab glucose this morning (67 mg/dl).   MD- Please consider the following in-hospital insulin adjustments:  1. Add Novolog Sensitive SSI (0-9 units) TID AC + HS  2. Decrease Levemir slightly to 20 units QHS    Will follow Wyn Quaker RN, MSN, CDE Diabetes Coordinator Inpatient Glycemic Control Team Team Pager: [phone] (8a-5p)

## 2014-10-18 NOTE — Discharge Summary (Signed)
Physician Discharge Summary  [name] [name] [id number] DOB: 08-12-1940 DOA: 10/16/2014  PCP: [name], MD  Admit date: 10/16/2014 Discharge date: 10/18/2014  Time spent: 50 minutes  Recommendations for Outpatient Follow-up:  1. Follow-up b met -she has a follow-up appointment on 8/16 with her PCP  Discharge Condition: Stable Diet recommendation: Low sodium heart healthy  Discharge Diagnoses:  Principal Problem:   Acute pyelonephritis Active Problems:   Sepsis   CAD (coronary artery disease)   Diabetes mellitus, type II   HTN (hypertension)   CHF (congestive heart failure)   Chronic kidney disease, stage III (moderate)   Hyperlipidemia   Hyperkalemia   History of present illness:  [name] [name] is a 74 y.o. female with a history of coronary artery disease status post CABG, congestive heart failure, carotid artery stenosis, abdominal aortic aneurysm, hypertension, type 2 diabetes mellitus and hypothyroidism. She presented with a complaint of several days of right-sided flank pain which was severe on the day of admission. She also complained of frequency urgency and mild dysuria and was noted to have suprapubic tenderness on exam. She also had 2 episodes of diarrhea at home. She used to hydrocodone the night before to help her sleep. She was found to have a positive UA and was admitted for pyelonephritis  Hospital Course:  Principal Problem:  Acute pyelonephritis -Symptoms resolved after one dose of ceftriaxone-continue to follow culture is growing greater than 100,000 colonies of gram-negative rods- transition to Omnicef to complete a 10 day course I will personally follow the cultures to ensure that it is sensitive  Active Problems:  Sepsis -Fever and leukocytosis in relation to above- resolved   Hyperkalemia -Possibly due to potassium replacement and acute renal failure -Given an albuterol treatment and subcutaneous insulin in the ER -Resolved-   CAD (coronary  artery disease) -Continue aspirin and Plavix   Diabetes mellitus, type II -Continue mealtime NovoLog and Levemir   HTN (hypertension) -Continue metoprolol   CHF (congestive heart failure)-systolic and diastolic -Last Echo in 4496 reveals an EF of 15% and grade 3 diastolic dysfunction - Lasix has been on hold due to below mentioned renal failure-however, she complains of shortness of breath and has some basilar crackles today-oxygen level is 96% on room air-will resume Lasix today and give her dose of IV 20 mg  Acute on Chronic kidney disease, stage III (moderate) -Baseline creatinine is about 1.7- 1.8- creatinine 2.31 and admission improved to 1.7 but now is slightly fluid overloaded and therefore Lasix has had to be resumed - Lasix held on admission-see above   Hyperlipidemia -Continue statin    Discharge Exam: Filed Weights   10/16/14 2100  Weight: 75.841 kg (167 lb 3.2 oz)   Filed Vitals:   10/18/14 0525  BP: 120/85  Pulse: 68  Temp: 97.9 F (36.6 C)  Resp: 18    General: AAO x 3, no distress Cardiovascular: RRR, no murmurs  Respiratory: Crackles at bases bilaterally GI: soft, non-tender, non-distended, bowel sound positive  Discharge Instructions You were cared for by a hospitalist during your hospital stay. If you have any questions about your discharge medications or the care you received while you were in the hospital after you are discharged, you can call the unit and asked to speak with the hospitalist on call if the hospitalist that took care of you is not available. Once you are discharged, your primary care physician will handle any further medical issues. Please note that NO REFILLS for any discharge medications will be  authorized once you are discharged, as it is imperative that you return to your primary care physician (or establish a relationship with a primary care physician if you do not have one) for your aftercare needs so that they can reassess your  need for medications and monitor your lab values.      Discharge Instructions    Diet - low sodium heart healthy    Complete by:  As directed      Increase activity slowly    Complete by:  As directed             Medication List    TAKE these medications        albuterol 108 (90 BASE) MCG/ACT inhaler  Commonly known as:  PROVENTIL HFA;VENTOLIN HFA  Inhale 2 puffs into the lungs every 6 (six) hours as needed for wheezing.     aspirin 81 MG chewable tablet  Chew 162 mg by mouth at bedtime.     calcium-vitamin D 500-200 MG-UNIT per tablet  Commonly known as:  OSCAL WITH D  Take 1 tablet by mouth every morning.     cefdinir 300 MG capsule  Commonly known as:  OMNICEF  Take 1 capsule (300 mg total) by mouth 2 (two) times daily.     clopidogrel 75 MG tablet  Commonly known as:  PLAVIX  TAKE 1 TABLET EVERY MORNING     fenofibrate micronized 67 MG capsule  Commonly known as:  LOFIBRA  Take 1 capsule (67 mg total) by mouth daily before breakfast.     ferrous sulfate 325 (65 FE) MG tablet  Take 325 mg by mouth 3 (three) times a week.     furosemide 20 MG tablet  Commonly known as:  LASIX  Take 1 tablet daily     glucose blood test strip  TEST AS DIRECTED 3 TIMES DAILY     HYDROcodone-acetaminophen 5-325 MG per tablet  Commonly known as:  NORCO/VICODIN  Take 2 tablets at bedtime     insulin aspart 100 UNIT/ML FlexPen  Commonly known as:  NOVOLOG  Inject 8 units three times a day with meals     Insulin Detemir 100 UNIT/ML Pen  Commonly known as:  LEVEMIR FLEXTOUCH  Inject 22 units at bedtime.     Insulin Pen Needle 32G X 4 MM Misc  Commonly known as:  BD PEN NEEDLE NANO U/F  Use 3 needles per day     latanoprost 0.005 % ophthalmic solution  Commonly known as:  XALATAN  Place 1 drop into both eyes at bedtime.     levothyroxine 125 MCG tablet  Commonly known as:  SYNTHROID, LEVOTHROID  Take 1 tablet (125 mcg total) by mouth daily.     magnesium oxide 400 MG  tablet  Commonly known as:  MAG-OX  Take 2 tablets (800 mg total) by mouth at bedtime.     metoprolol succinate 25 MG 24 hr tablet  Commonly known as:  TOPROL-XL  TAKE 1/2 TABLET (=12.5 MG) EVERY MORNING     omeprazole 20 MG capsule  Commonly known as:  PRILOSEC  TAKE 1 CAPSULE DAILY     OVER THE COUNTER MEDICATION  Place 1 patch onto the skin once as needed (pain.). Transdermal "aspirin" patch.     rosuvastatin 20 MG tablet  Commonly known as:  CRESTOR  Take 1 tablet (20 mg total) by mouth daily.     vitamin B-12 1000 MCG tablet  Commonly known as:  CYANOCOBALAMIN  Take 1,000  mcg by mouth every morning.     vitamin C 500 MG tablet  Commonly known as:  ASCORBIC ACID  Take 500 mg by mouth every morning.     Vitamin D (Ergocalciferol) 50000 UNITS Caps capsule  Commonly known as:  DRISDOL  Take 1 capsule (50,000 Units total) by mouth every 7 (seven) days. On thursdays       No Known Allergies Follow-up Information    Follow up with [facility], [place].   Specialty:  Endocrinology   Why:  has an appt fo 8/19   Contact information:   [address] [place] [id number] [phone]        The results of significant diagnostics from this hospitalization (including imaging, microbiology, ancillary and laboratory) are listed below for reference.    Significant Diagnostic Studies: No results found.  Microbiology: Recent Results (from the past 240 hour(s))  Urine culture     Status: None (Preliminary result)   Collection Time: 10/16/14  5:31 PM  Result Value Ref Range Status   Specimen Description URINE, RANDOM  Final   Special Requests NONE  Final   Culture   Final    >=100,000 COLONIES/mL ESCHERICHIA COLI Performed at [facility]    Report Status PENDING  Incomplete     Labs: Basic Metabolic Panel:  Recent Labs Lab 10/16/14 1344 10/16/14 1618 10/17/14 0555 10/18/14 0522  NA 132*  --  137 138  K 5.8* 5.5* 4.9 5.0  CL 100*  --  104 107   CO2 23  --  26 22  GLUCOSE 270*  --  122* 67  BUN 43*  --  39* 32*  CREATININE 2.31*  --  2.04* 1.71*  CALCIUM 8.9  --  8.4* 8.9   Liver Function Tests:  Recent Labs Lab 10/17/14 0555 10/18/14 0522  AST 53* 69*  ALT 25 34  ALKPHOS 43 48  BILITOT 0.4 0.2*  PROT 6.1* 6.4*  ALBUMIN 2.6* 2.8*   No results for input(s): LIPASE, AMYLASE in the last 168 hours. No results for input(s): AMMONIA in the last 168 hours. CBC:  Recent Labs Lab 10/16/14 1344 10/17/14 0555 10/18/14 0522  WBC 15.9* 6.5 7.0  NEUTROABS  --  5.3 5.0  HGB 11.0* 10.0* 10.7*  HCT 35.2* 32.1* 35.2*  MCV 92.4 92.0 92.1  PLT 309 256 250   Cardiac Enzymes: No results for input(s): CKTOTAL, CKMB, CKMBINDEX, TROPONINI in the last 168 hours. BNP: BNP (last 3 results) No results for input(s): BNP in the last 8760 hours.  ProBNP (last 3 results) No results for input(s): PROBNP in the last 8760 hours.  CBG:  Recent Labs Lab 10/17/14 1131 10/17/14 1638 10/17/14 2141 10/18/14 0731 10/18/14 1130  GLUCAP 97 125* 113* 117* 90       SignedDebbe Odea, MD [facility] 10/18/2014, 1:37 PM

## 2014-10-19 LAB — URINE CULTURE: Culture: >=100000

## 2014-10-21 ENCOUNTER — Other Ambulatory Visit: Payer: Self-pay | Admitting: *Deleted

## 2014-10-21 ENCOUNTER — Telehealth: Payer: Self-pay | Admitting: Endocrinology

## 2014-10-21 MED ORDER — MAGNESIUM OXIDE 400 MG PO TABS: 800.0000 mg | ORAL_TABLET | Freq: Every day | ORAL | Status: DC

## 2014-10-21 NOTE — Telephone Encounter (Signed)
Please see below and advise.

## 2014-10-21 NOTE — Telephone Encounter (Signed)
Patient stated that she just got of the hospital, they took plenty of blood from her, do still she need to come in tomorrow for her labs drawn?

## 2014-10-21 NOTE — Telephone Encounter (Signed)
Noted patient is aware 

## 2014-10-21 NOTE — Telephone Encounter (Signed)
We can cancel lab appointment and she can have A1c at the time of visit

## 2014-10-22 ENCOUNTER — Other Ambulatory Visit: Payer: Medicare Other

## 2014-10-25 ENCOUNTER — Ambulatory Visit (INDEPENDENT_AMBULATORY_CARE_PROVIDER_SITE_OTHER): Payer: Medicare Other | Admitting: Endocrinology

## 2014-10-25 ENCOUNTER — Other Ambulatory Visit: Payer: Self-pay | Admitting: *Deleted

## 2014-10-25 ENCOUNTER — Encounter: Payer: Self-pay | Admitting: Endocrinology

## 2014-10-25 VITALS — BP 128/60 | HR 80 | Temp 98.1°F | Resp 16 | Ht 66.75 in | Wt 166.0 lb

## 2014-10-25 DIAGNOSIS — IMO0002 Reserved for concepts with insufficient information to code with codable children: Secondary | ICD-10-CM

## 2014-10-25 DIAGNOSIS — M545 Low back pain, unspecified: Secondary | ICD-10-CM

## 2014-10-25 DIAGNOSIS — N10 Acute tubulo-interstitial nephritis: Secondary | ICD-10-CM

## 2014-10-25 DIAGNOSIS — E119 Type 2 diabetes mellitus without complications: Secondary | ICD-10-CM

## 2014-10-25 DIAGNOSIS — E1165 Type 2 diabetes mellitus with hyperglycemia: Secondary | ICD-10-CM

## 2014-10-25 DIAGNOSIS — N183 Chronic kidney disease, stage 3 unspecified: Secondary | ICD-10-CM

## 2014-10-25 LAB — URINALYSIS, ROUTINE W REFLEX MICROSCOPIC
Bilirubin Urine: NEGATIVE
Ketones, ur: NEGATIVE
NITRITE: NEGATIVE
Specific Gravity, Urine: 1.02 (ref 1.000–1.030)
Urine Glucose: NEGATIVE
Urobilinogen, UA: 0.2 (ref 0.0–1.0)
pH: 5 (ref 5.0–8.0)

## 2014-10-25 LAB — POCT GLYCOSYLATED HEMOGLOBIN (HGB A1C): Hemoglobin A1C: 6.6

## 2014-10-25 MED ORDER — CYCLOBENZAPRINE HCL 10 MG PO TABS: ORAL_TABLET | ORAL | Status: DC

## 2014-10-25 NOTE — Patient Instructions (Signed)
Check blood sugars on waking up .Marland Kitchen2-3  .Marland Kitchen times a week Also check blood sugars about 2 hours after a meal and do this after different meals by rotation  Recommended blood sugar levels on waking up is 90-130 and about 2 hours after meal is 140-180 Please bring blood sugar monitor to each visit.  Robaxin generic upto 3x daily

## 2014-10-25 NOTE — Progress Notes (Signed)
Patient ID: [name] [name], female   DOB: Jul 24, 1940, 74 y.o.   MRN: [id number]   Reason for Appointment: Post hospital visit  History of Present Illness   Patient was discharged on 10/18/14, Friday and was contacted on the phone on 10/21/14  History and physical, discharge summary from hospital, labs, medications before and after discharge were reviewed in detail  URINARY tract infection: She had pyelonephritis and has been discharged on [place]a which she just finishing up the course of treatment She has no further urinary discomfort or flank pain, no fever She had presented with flank pain but is only complaining about low back pain which is not new  Urine culture showed Escherichia coli sensitive to all organisms She has been followed by urologist intermittently.  Apparently has not had any obstruction on last evaluation.    LOW BACK PAIN:  She is complaining of significant amount of low back pain, she thinks this is related to her kidney but is occurring intermittently and in certain positions and with activity without any radiation to her legs. She takes 2 hydrocodone in the morning, 1 in the daytime and 2 at bedtime Previously has had epidural steroids which has reduced her radiculopathy She was given a topical treatment Pennsaid by her orthopedic surgeon,  but does not find this useful  PREVIOUS problems:    Type 2 diabetes mellitus, date of diagnosis: 1986.   The insulin regimen is described as Levemir 24 units hs, Novolog 8 ac twice a day and she is compliant with her regimen.   Type 2 diabetes  has been treated in the last few years with low dose basal bolus insulin regimen. She has not been taking  any oral hypoglycemic drugs including metformin partly because of GI side effects   Her blood sugars are overall fairly good with A1c still below 7%  She has checked her blood sugars mostly in the mornings lately Has only occasional mildly high readings but not  recently Mostly eating 2 meals a day Has not been very active including recently but her weight is slightly less, has had some decreased appetite after recent illness  Taking insulin fairly consistently including before meals  Side effects from medications: Diarrhea from metformin and nausea and vomiting from GLP-1 drugs  Monitors blood glucose: 1.1 times a day  Glucometer: One Touch.  Blood Glucose readings:   Mean values apply above for all meters except median for One Touch  PRE-MEAL Fasting Lunch Dinner Bedtime Overall  Glucose range:  95-167   111, 120      Mean/median:  127      128    POST-MEAL PC Breakfast PC Lunch PC Dinner  Glucose range:   121-203   161, 171   Mean/median:      Hypoglycemia: None recently.   Meals: she is usually eating low fat meals but is sometimes eating out at lunch; meals usually at 11 AM,and supper at 5 pm.  Calorie intake: Usually controlled. Moderate Carbs Physical activity: exercise:was on exercise bike but none now Dietician visit: Most recent:, 5/13.   Wt Readings from Last 3 Encounters:  10/25/14 166 lb (75.297 kg)  10/16/14 167 lb 3.2 oz (75.841 kg)  07/25/14 170 lb (77.111 kg)    Lab Results  Component Value Date   HGBA1C 6.6 10/25/2014   HGBA1C 6.4 07/22/2014   HGBA1C 6.9* 04/24/2014   Lab Results  Component Value Date   MICROALBUR 17.2* 01/23/2014   LDLCALC 49 09/13/2013  CREATININE 1.71* 10/18/2014      2. CKD: Her creatinine is persistently high even after stopping low dose Tekturna for nephropathy  her creatinine is gradually increasing Does not have any significant proteinuria now, has a mild increase in urine microalbumin in the 40s  Lab Results  Component Value Date   CREATININE 1.71* 10/18/2014   CREATININE 2.04* 10/17/2014   CREATININE 2.31* 10/16/2014    3.  HYPOTHYROIDISM: She has had long-standing primary hypothyroidism Has  A normal TSH  now, previously had been variable and her dose was increased in  11/15 to 125 g. She does not complain of any fatigue  Lab Results  Component Value Date   TSH 3.42 07/22/2014   TSH 2.07 04/24/2014   TSH 7.38* 01/23/2014   FREET4 1.33 07/22/2014   FREET4 1.29 04/24/2014   FREET4 1.12 01/23/2014          Medication List       This list is accurate as of: 10/25/14 11:59 PM.  Always use your most recent med list.               albuterol 108 (90 BASE) MCG/ACT inhaler  Commonly known as:  PROVENTIL HFA;VENTOLIN HFA  Inhale 2 puffs into the lungs every 6 (six) hours as needed for wheezing.     aspirin 81 MG chewable tablet  Chew 162 mg by mouth at bedtime.     calcium-vitamin D 500-200 MG-UNIT per tablet  Commonly known as:  OSCAL WITH D  Take 1 tablet by mouth every morning.     cefdinir 300 MG capsule  Commonly known as:  OMNICEF  Take 1 capsule (300 mg total) by mouth 2 (two) times daily.     clopidogrel 75 MG tablet  Commonly known as:  PLAVIX  TAKE 1 TABLET EVERY MORNING     cyclobenzaprine 10 MG tablet  Commonly known as:  FLEXERIL  1/2 tab tid prn     diphenoxylate-atropine 2.5-0.025 MG per tablet  Commonly known as:  LOMOTIL  Take 1 tablet by mouth 3 (three) times daily.     fenofibrate micronized 67 MG capsule  Commonly known as:  LOFIBRA  Take 1 capsule (67 mg total) by mouth daily before breakfast.     ferrous sulfate 325 (65 FE) MG tablet  Take 325 mg by mouth 3 (three) times a week.     furosemide 20 MG tablet  Commonly known as:  LASIX  Take 1 tablet daily     glucose blood test strip  TEST AS DIRECTED 3 TIMES DAILY     HYDROcodone-acetaminophen 5-325 MG per tablet  Commonly known as:  NORCO/VICODIN  Take 2 tablets at bedtime     insulin aspart 100 UNIT/ML FlexPen  Commonly known as:  NOVOLOG  Inject 8 units three times a day with meals     Insulin Detemir 100 UNIT/ML Pen  Commonly known as:  LEVEMIR FLEXTOUCH  Inject 22 units at bedtime.     Insulin Pen Needle 32G X 4 MM Misc  Commonly known  as:  BD PEN NEEDLE NANO U/F  Use 3 needles per day     latanoprost 0.005 % ophthalmic solution  Commonly known as:  XALATAN  Place 1 drop into both eyes at bedtime.     levothyroxine 125 MCG tablet  Commonly known as:  SYNTHROID, LEVOTHROID  Take 1 tablet (125 mcg total) by mouth daily.     magnesium oxide 400 MG tablet  Commonly known as:  MAG-OX  Take 2 tablets (800 mg total) by mouth at bedtime.     metoprolol succinate 25 MG 24 hr tablet  Commonly known as:  TOPROL-XL  TAKE 1/2 TABLET (=12.5 MG) EVERY MORNING     omeprazole 20 MG capsule  Commonly known as:  PRILOSEC  TAKE 1 CAPSULE DAILY     OVER THE COUNTER MEDICATION  Place 1 patch onto the skin once as needed (pain.). Transdermal "aspirin" patch.     rosuvastatin 20 MG tablet  Commonly known as:  CRESTOR  Take 1 tablet (20 mg total) by mouth daily.     vitamin B-12 1000 MCG tablet  Commonly known as:  CYANOCOBALAMIN  Take 1,000 mcg by mouth every morning.     vitamin C 500 MG tablet  Commonly known as:  ASCORBIC ACID  Take 500 mg by mouth every morning.     Vitamin D (Ergocalciferol) 50000 UNITS Caps capsule  Commonly known as:  DRISDOL  Take 1 capsule (50,000 Units total) by mouth every 7 (seven) days. On thursdays        Allergies: No Known Allergies  Past Medical History  Diagnosis Date  . Diabetes mellitus without complication     type 2  . CHF (congestive heart failure)   . Coronary artery disease   . AAA (abdominal aortic aneurysm) 03/09/2012  . PVD (peripheral vascular disease)   . Carotid artery stenosis     carotid doppler 06/2012 - Right CCA/Bulb/ICA with chronic occlusion; L vertebral artery with abnormal blood flow; L Bulb/Prox ICA  s/p endarterectomy with mild fibrous plaque, 50% diameter reduction  . Hypertension   . Hyperlipidemia   . Hypothyroidism   . History of nuclear stress test 11/24/2011    lexiscan; nonischemic, low risk   . PAD (peripheral artery disease)     09/2010 [facility] - R  ABI of 0.45, occluded fem-pop bypass graft, L ABI of 0.59 with occluded SFA; severe arterial insuff  . Ischemic cardiomyopathy     EF 03/2010 50-55%    Past Surgical History  Procedure Laterality Date  . Coronary artery bypass graft  1997    x6; internal mammary to LAD, SVG to ramus #1 & #2, SVG to OM, SVG to PDA,   . Abdominal hysterectomy  1986  . Tumor removal    . Coronary stent placement    . Femoral artery repair    . Cystoscopy w/ ureteral stent placement  03/10/2012    Procedure: CYSTOSCOPY WITH RETROGRADE PYELOGRAM/URETERAL STENT PLACEMENT;  Surgeon: Hanley Ben, MD;  Location: [facility];  Service: Urology;  Laterality: Left;  . Carotid endarterectomy Bilateral   . Aortobifemoral bypass grafting  1997    with R fem-pop  . Cardiac catheterization  12/2009    occluded vein to OM & ramus branches, patent vein to PDA, patent LIMA to LAD (Dr. [name], [facility]) - later had thrombectomy of R fem-pop bypass ad R common femoral & profunda femoris artery (Dr. Oneida Alar)  . Transthoracic echocardiogram  03/25/2010    EF 99991111, LV systolic function low normal with mild inferoseptal hypocontractility; LA mildly dilated; mod MR; mild TR, RV systolic pressure elevated, mild pulm HTN; AV mildly sclerotic; mild pulm valve regurg; aortic root sclerosis/calcif     Family History  Problem Relation Age of Onset  . Congestive Heart Failure Mother   . Diabetes Mother   . Stroke Father   . Cancer Maternal Aunt     Breast cancer    Social History:  reports that she quit smoking about 5 years ago. She has never used smokeless tobacco. She reports that she does not drink alcohol or use illicit drugs.  Review of Systems -    SHORTNESS of breath: Not significant recently, has not been very active Sometimes she will get relief with taking her albuterol inhaler    Also taking Lasix daily as this previously had helped her tendency to dyspnea  No recent edema  Cardiovascular ROS:  positive for - CAD and  PVD, no recent symptoms  Hyperlipidemia: Has history of high LDL and triglycerides/low HDL  She  Was changed from Lipitor to Crestor because of higher LDL and she was not wanting to pay the higher cost of Zetia with the Lipitor  LDL is 91 Tends to have high triglycerides  which is not in control also despite taking fenofibrate No recurrence of liver dysfunction   Lab Results  Component Value Date   CHOL 179 07/22/2014   HDL 35.30* 07/22/2014   LDLCALC 49 09/13/2013   LDLDIRECT 91.0 07/22/2014   TRIG 328.0* 07/22/2014   CHOLHDL 5 07/22/2014   Lab Results  Component Value Date   ALT 34 10/18/2014    Iron Deficiency anemia:  Has had  anemia with  persistently low hemoglobin and also iron deficiency. Previous Iron saturation was 19 in 2/14. Takes iron  Every other day and hemoglobin has been near normal now  Also taking B12.     Lab Results  Component Value Date   WBC 7.0 10/18/2014   HGB 10.7* 10/18/2014   HCT 35.2* 10/18/2014   MCV 92.1 10/18/2014   PLT 250 10/18/2014    Vitamin D deficiency: She has been on supplements and taking her weekly dose consistently   ?  Diastolic dysfunction with history of dyspnea.  She is taking Lasix regularly without any symptoms or change in renal function or potassium   She has carotid ultrasound followed by a cardiologist and this is stable.   HYPOTHYROID and: Currently on 125 g  Lab Results  Component Value Date   TSH 3.42 07/22/2014   TSH 2.07 04/24/2014   TSH 7.38* 01/23/2014   FREET4 1.33 07/22/2014   FREET4 1.29 04/24/2014   FREET4 1.12 01/23/2014       Examination:   BP 128/60 mmHg  Pulse 80  Temp(Src) 98.1 F (36.7 C)  Resp 16  Ht 5' 6.75" (1.695 m)  Wt 166 lb (75.297 kg)  BMI 26.21 kg/m2  SpO2 97%  Body mass index is 26.21 kg/(m^2).    Mucous membranes appear normal No pedal edema present She has no flank tenderness: Minimal tenderness of the lower spine, has some paravertebral spasm on the right and  lumbar area  Assesment/PLAN:    1. Recent pyelonephritis and Escherichia coli UTI: currently asymptomatic with treatment with antibiotics and is still finishing up her course of antibiotic from the hospital stay.  Will check urinalysis today and confirm she has had adequate treatment.  However recommended follow-up with urologist, also need to get records from their office 2. Low back pain: She is still having significant amount of pain and taking 4-5 hydrocodone a day.  She does have some muscle spasm on the right and will try her on Robaxin or Flexeril, some of her medications are not covered by insurance or tonight because of age.  Recommend follow-up with orthopedic surgeon also 3. Renal insufficiency: This was worsened in the hospital with dehydration and acute infection, improving now, will follow up  in another month 4. DIABETES: Her blood sugars are  Overall well controlled with A1c in the 6.5-70 range consistently  Considering her multiple medical problems, duration of diabetes and age her blood sugars are well controlled  She will continue the same regimen of basal bolus insulin but new binder to check more readings at various times other than mostly in the morning which she is doing recently Encouraged her to start using exercise bike when she is able to  5. History of mild hypertension: Blood pressure is normal without any medications, only on low-dose metoprolol.   6. CKD and nephropathy:  It is likely to be from nephrosclerosis, minimal increase in mitral albumin at times.   7. Hyperkalemia in the past, no  problem recently 8. HYPOTHYROIDISM: Her TSH will need to be followed periodically, previously   Staying normal with 125 g 9. History of fluid overload/mild  diastolic CHF.  controlled with low-dose Lasix.    10. Hyperlipidemia: needs to  Continue Crestor brand-name.  Discussed that she may be able to take Zetia in addition when it becomes Generic later this year 11.  anemia:    this was slightly worse in the hospital possibly from acute illness and will need to be followed up.  Also will need colorectal screening    Transitional care level of service = moderate complexity  Patient Instructions  Check blood sugars on waking up .Marland Kitchen2-3  .Marland Kitchen times a week Also check blood sugars about 2 hours after a meal and do this after different meals by rotation  Recommended blood sugar levels on waking up is 90-130 and about 2 hours after meal is 140-180 Please bring blood sugar monitor to each visit.  Robaxin generic upto 3x daily     [facility]  10/27/2014

## 2014-10-27 NOTE — Progress Notes (Signed)
Quick Note:  Please let patient know that the urine still shows pus cells, need to have her see her urologist, unable to get appointment soon she should let us know  ______

## 2014-10-28 DIAGNOSIS — R1031 Right lower quadrant pain: Secondary | ICD-10-CM | POA: Diagnosis not present

## 2014-10-28 DIAGNOSIS — N390 Urinary tract infection, site not specified: Secondary | ICD-10-CM | POA: Diagnosis not present

## 2014-11-01 ENCOUNTER — Telehealth: Payer: Self-pay | Admitting: Endocrinology

## 2014-11-01 ENCOUNTER — Other Ambulatory Visit: Payer: Self-pay | Admitting: *Deleted

## 2014-11-01 MED ORDER — CIPROFLOXACIN HCL 500 MG PO TABS: 500.0000 mg | ORAL_TABLET | Freq: Two times a day (BID) | ORAL | Status: DC

## 2014-11-01 NOTE — Telephone Encounter (Signed)
She had a test done by urologist she should ask him about it.  Also need to know what symptoms she is having?

## 2014-11-01 NOTE — Telephone Encounter (Signed)
Pt called , asking that Suanne Marker give her a call back regarding urine sample  [phone]

## 2014-11-01 NOTE — Telephone Encounter (Signed)
Patient called saying she has a Kidney infection and would like you to call her in an antibiotic please.

## 2014-11-01 NOTE — Telephone Encounter (Signed)
She saw her urologist on the Friday after she saw you, when the test results came back they told her there was no Dr. There to write the rx and to ask you if you could do it. She's still having severe back pain.

## 2014-11-04 ENCOUNTER — Telehealth: Payer: Self-pay | Admitting: Endocrinology

## 2014-11-04 NOTE — Telephone Encounter (Signed)
OV notes and labs are on your desk.

## 2014-11-04 NOTE — Telephone Encounter (Signed)
Cipro 500 mg twice a day sent for 10 days pending information from urologist, please call them again to get information on office visit and labs

## 2014-11-04 NOTE — Telephone Encounter (Signed)
Pt called said Dr. Dwyane Dee told her to stop taking a medication but doesn't remember which one to stop please call pt and advise, [phone]

## 2014-11-07 ENCOUNTER — Telehealth: Payer: Self-pay | Admitting: Endocrinology

## 2014-11-07 ENCOUNTER — Other Ambulatory Visit: Payer: Self-pay | Admitting: *Deleted

## 2014-11-07 MED ORDER — HYDROCODONE-ACETAMINOPHEN 5-325 MG PO TABS: ORAL_TABLET | ORAL | Status: DC

## 2014-11-07 NOTE — Telephone Encounter (Signed)
ready

## 2014-11-07 NOTE — Telephone Encounter (Signed)
Patient called and would like her pain medication filled   Please call patient when completed so she can come and get it    Thank you

## 2014-11-14 ENCOUNTER — Telehealth: Payer: Self-pay | Admitting: Cardiovascular Disease

## 2014-11-14 DIAGNOSIS — M47816 Spondylosis without myelopathy or radiculopathy, lumbar region: Secondary | ICD-10-CM | POA: Diagnosis not present

## 2014-11-14 NOTE — Telephone Encounter (Signed)
[name] [name] is calling because she pulled muscle in groin where she had the bypass at and wants to know what should she do about it .Marland Kitchen Please call  Thanks

## 2014-11-14 NOTE — Telephone Encounter (Signed)
LVMTCB

## 2014-11-15 NOTE — Telephone Encounter (Signed)
  Pt just wanted you to know she went to her Orthopedic yesterday. 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

## 2014-11-15 NOTE — Telephone Encounter (Signed)
Pt just wanted you to know she went to her Orthopedic yesterday. 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

## 2014-11-20 ENCOUNTER — Other Ambulatory Visit (INDEPENDENT_AMBULATORY_CARE_PROVIDER_SITE_OTHER): Payer: Medicare Other

## 2014-11-20 DIAGNOSIS — N183 Chronic kidney disease, stage 3 unspecified: Secondary | ICD-10-CM

## 2014-11-20 DIAGNOSIS — E1165 Type 2 diabetes mellitus with hyperglycemia: Secondary | ICD-10-CM | POA: Diagnosis not present

## 2014-11-20 DIAGNOSIS — IMO0002 Reserved for concepts with insufficient information to code with codable children: Secondary | ICD-10-CM

## 2014-11-20 LAB — CBC
HEMATOCRIT: 34.3 % — AB (ref 36.0–46.0)
Hemoglobin: 11.1 g/dL — ABNORMAL LOW (ref 12.0–15.0)
MCHC: 32.5 g/dL (ref 30.0–36.0)
MCV: 86.7 fl (ref 78.0–100.0)
Platelets: 324 10*3/uL (ref 150.0–400.0)
RBC: 3.95 Mil/uL (ref 3.87–5.11)
RDW: 15.5 % (ref 11.5–15.5)
WBC: 9 10*3/uL (ref 4.0–10.5)

## 2014-11-20 LAB — BASIC METABOLIC PANEL
BUN: 28 mg/dL — ABNORMAL HIGH (ref 6–23)
CO2: 24 meq/L (ref 19–32)
Calcium: 9.6 mg/dL (ref 8.4–10.5)
Chloride: 103 mEq/L (ref 96–112)
Creatinine, Ser: 1.43 mg/dL — ABNORMAL HIGH (ref 0.40–1.20)
GFR: 38.16 mL/min — ABNORMAL LOW (ref >60.00)
Glucose, Bld: 142 mg/dL — ABNORMAL HIGH (ref 70–99)
Potassium: 5 mEq/L (ref 3.5–5.1)
SODIUM: 139 meq/L (ref 135–145)

## 2014-11-20 LAB — TSH: TSH: 1.59 u[IU]/mL (ref 0.35–4.50)

## 2014-11-25 ENCOUNTER — Ambulatory Visit (INDEPENDENT_AMBULATORY_CARE_PROVIDER_SITE_OTHER): Payer: Medicare Other | Admitting: Endocrinology

## 2014-11-25 ENCOUNTER — Other Ambulatory Visit: Payer: Self-pay

## 2014-11-25 ENCOUNTER — Encounter: Payer: Self-pay | Admitting: Endocrinology

## 2014-11-25 ENCOUNTER — Telehealth: Payer: Self-pay | Admitting: Endocrinology

## 2014-11-25 VITALS — BP 122/86 | HR 73 | Temp 97.8°F | Ht 67.0 in | Wt 163.0 lb

## 2014-11-25 DIAGNOSIS — E038 Other specified hypothyroidism: Secondary | ICD-10-CM

## 2014-11-25 DIAGNOSIS — N183 Chronic kidney disease, stage 3 unspecified: Secondary | ICD-10-CM

## 2014-11-25 DIAGNOSIS — I6523 Occlusion and stenosis of bilateral carotid arteries: Secondary | ICD-10-CM | POA: Diagnosis not present

## 2014-11-25 DIAGNOSIS — N390 Urinary tract infection, site not specified: Secondary | ICD-10-CM

## 2014-11-25 DIAGNOSIS — IMO0002 Reserved for concepts with insufficient information to code with codable children: Secondary | ICD-10-CM

## 2014-11-25 DIAGNOSIS — E063 Autoimmune thyroiditis: Secondary | ICD-10-CM

## 2014-11-25 DIAGNOSIS — E1165 Type 2 diabetes mellitus with hyperglycemia: Secondary | ICD-10-CM

## 2014-11-25 MED ORDER — GLUCOSE BLOOD VI STRP: ORAL_STRIP | Status: DC

## 2014-11-25 MED ORDER — TRIMETHOPRIM 100 MG PO TABS: 100.0000 mg | ORAL_TABLET | Freq: Every day | ORAL | Status: DC

## 2014-11-25 NOTE — Progress Notes (Signed)
Patient ID: [name] [name], female   DOB: 1940-08-05, 74 y.o.   MRN: [name]   Reason for Appointment:  Multiple problems  History of Present Illness    LOW BACK PAIN:  She is having much less low back pain and her orthopedic surgeon felt that her pain was exacerbated by her pyelonephritis She takes 2 hydrocodone in the morning, 1 in the daytime and 2 at bedtime Previously has had epidural steroids which has reduced her radiculopathy She was given a topical treatment Pennsaid by her orthopedic surgeon,  but does not find this useful  URINARY tract infection: She had pyelonephritis and urine culture done by urologist was negative on antibiotics but previously showed Escherichia coli sensitive to all organisms She has today started noticing some cloudiness and order the urine but no burning Her urinalysis consistently shows pyuria   She has been followed by urologist but no further workup suggested.  Apparently has not had any obstruction on last evaluation.  OTHER active problems:    Type 2 diabetes mellitus, date of diagnosis: 1986.   The insulin regimen is described as Levemir 24 units hs, Novolog 8 ac twice a day and she is compliant with her regimen.   Type 2 diabetes  has been treated in the last few years with low dose basal bolus insulin regimen. She has not been taking  any oral hypoglycemic drugs including metformin partly because of GI side effects  Recently she is using the strips obtained from an outpatient and these look like they are expired Her blood sugars are all nearly normal recently with a range of 67-159 and median 118 at various times of the day, has only one low normal blood sugar without symptoms Appetite is recently normal  Taking insulin fairly consistently including before meals  Side effects from medications: Diarrhea from metformin and nausea and vomiting from GLP-1 drugs  Monitors blood glucose: 1.1 times a day  Glucometer: One Touch.    Blood Glucose readings: As above  Hypoglycemia: None recently.   Meals: she is usually eating low fat meals but is sometimes eating out at lunch; meals usually at 11 AM,and supper at 5 pm.  Calorie intake: Usually controlled. Moderate Carbs Physical activity: exercise: Unable to do any and also not motivated  Dietician visit: Most recent:, 5/13.   Wt Readings from Last 3 Encounters:  11/25/14 163 lb (73.936 kg)  10/25/14 166 lb (75.297 kg)  10/16/14 167 lb 3.2 oz (75.841 kg)    Lab Results  Component Value Date   HGBA1C 6.6 10/25/2014   HGBA1C 6.4 07/22/2014   HGBA1C 6.9* 04/24/2014   Lab Results  Component Value Date   MICROALBUR 17.2* 01/23/2014   LDLCALC 49 09/13/2013   CREATININE 1.43* 11/20/2014      2. CKD: Her creatinine was persistently high but appears to be better now Previously on low dose Tekturna for nephropathy which was stopped Does not have any significant proteinuria lately, has a mild increase in urine microalbumin in the 40s  Lab Results  Component Value Date   CREATININE 1.43* 11/20/2014   CREATININE 1.71* 10/18/2014   CREATININE 2.04* 10/17/2014    3.  HYPOTHYROIDISM: She has had long-standing primary hypothyroidism TSH has been fairly consistent this year, previously had been variable and her dose was increased in 11/15 to 125 g. She does not complain of any fatigue  Lab Results  Component Value Date   TSH 1.59 11/20/2014   TSH 3.42 07/22/2014  TSH 2.07 04/24/2014   FREET4 1.33 07/22/2014   FREET4 1.29 04/24/2014   FREET4 1.12 01/23/2014          Medication List       This list is accurate as of: 11/25/14 11:59 PM.  Always use your most recent med list.               albuterol 108 (90 BASE) MCG/ACT inhaler  Commonly known as:  PROVENTIL HFA;VENTOLIN HFA  Inhale 2 puffs into the lungs every 6 (six) hours as needed for wheezing.     aspirin 81 MG chewable tablet  Chew 162 mg by mouth at bedtime.     calcium-vitamin D  500-200 MG-UNIT per tablet  Commonly known as:  OSCAL WITH D  Take 1 tablet by mouth every morning.     cefdinir 300 MG capsule  Commonly known as:  OMNICEF  Take 1 capsule (300 mg total) by mouth 2 (two) times daily.     ciprofloxacin 500 MG tablet  Commonly known as:  CIPRO  Take 1 tablet (500 mg total) by mouth 2 (two) times daily.     clopidogrel 75 MG tablet  Commonly known as:  PLAVIX  TAKE 1 TABLET EVERY MORNING     cyclobenzaprine 10 MG tablet  Commonly known as:  FLEXERIL  1/2 tab tid prn     diphenoxylate-atropine 2.5-0.025 MG per tablet  Commonly known as:  LOMOTIL  Take 1 tablet by mouth 3 (three) times daily.     fenofibrate micronized 67 MG capsule  Commonly known as:  LOFIBRA  Take 1 capsule (67 mg total) by mouth daily before breakfast.     ferrous sulfate 325 (65 FE) MG tablet  Take 325 mg by mouth 3 (three) times a week.     furosemide 20 MG tablet  Commonly known as:  LASIX  Take 1 tablet daily     glucose blood test strip  TEST AS DIRECTED 3 TIMES DAILY     glucose blood test strip  Commonly known as:  ONE TOUCH ULTRA TEST  Pt is to be checking blood sugar 1-2 times a daily     HYDROcodone-acetaminophen 5-325 MG per tablet  Commonly known as:  NORCO/VICODIN  Take 2 tablets at bedtime     insulin aspart 100 UNIT/ML FlexPen  Commonly known as:  NOVOLOG  Inject 8 units three times a day with meals     Insulin Detemir 100 UNIT/ML Pen  Commonly known as:  LEVEMIR FLEXTOUCH  Inject 22 units at bedtime.     Insulin Pen Needle 32G X 4 MM Misc  Commonly known as:  BD PEN NEEDLE NANO U/F  Use 3 needles per day     latanoprost 0.005 % ophthalmic solution  Commonly known as:  XALATAN  Place 1 drop into both eyes at bedtime.     levothyroxine 125 MCG tablet  Commonly known as:  SYNTHROID, LEVOTHROID  Take 1 tablet (125 mcg total) by mouth daily.     magnesium oxide 400 MG tablet  Commonly known as:  MAG-OX  Take 2 tablets (800 mg total) by  mouth at bedtime.     metoprolol succinate 25 MG 24 hr tablet  Commonly known as:  TOPROL-XL  TAKE 1/2 TABLET (=12.5 MG) EVERY MORNING     omeprazole 20 MG capsule  Commonly known as:  PRILOSEC  TAKE 1 CAPSULE DAILY     OVER THE COUNTER MEDICATION  Place 1 patch onto the skin  once as needed (pain.). Transdermal "aspirin" patch.     rosuvastatin 20 MG tablet  Commonly known as:  CRESTOR  Take 1 tablet (20 mg total) by mouth daily.     trimethoprim 100 MG tablet  Commonly known as:  TRIMPEX  Take 1 tablet (100 mg total) by mouth daily.     vitamin B-12 1000 MCG tablet  Commonly known as:  CYANOCOBALAMIN  Take 1,000 mcg by mouth every morning.     vitamin C 500 MG tablet  Commonly known as:  ASCORBIC ACID  Take 500 mg by mouth every morning.     Vitamin D (Ergocalciferol) 50000 UNITS Caps capsule  Commonly known as:  DRISDOL  Take 1 capsule (50,000 Units total) by mouth every 7 (seven) days. On thursdays        Allergies: No Known Allergies  Past Medical History  Diagnosis Date  . Diabetes mellitus without complication     type 2  . CHF (congestive heart failure)   . Coronary artery disease   . AAA (abdominal aortic aneurysm) 03/09/2012  . PVD (peripheral vascular disease)   . Carotid artery stenosis     carotid doppler 06/2012 - Right CCA/Bulb/ICA with chronic occlusion; L vertebral artery with abnormal blood flow; L Bulb/Prox ICA  s/p endarterectomy with mild fibrous plaque, 50% diameter reduction  . Hypertension   . Hyperlipidemia   . Hypothyroidism   . History of nuclear stress test 11/24/2011    lexiscan; nonischemic, low risk   . PAD (peripheral artery disease)     09/2010 LEAs - R ABI of 0.45, occluded fem-pop bypass graft, L ABI of 0.59 with occluded SFA; severe arterial insuff  . Ischemic cardiomyopathy     EF 03/2010 50-55%    Past Surgical History  Procedure Laterality Date  . Coronary artery bypass graft  1997    x6; internal mammary to LAD, SVG to  ramus #1 & #2, SVG to OM, SVG to PDA,   . Abdominal hysterectomy  1986  . Tumor removal    . Coronary stent placement    . Femoral artery repair    . Cystoscopy w/ ureteral stent placement  03/10/2012    Procedure: CYSTOSCOPY WITH RETROGRADE PYELOGRAM/URETERAL STENT PLACEMENT;  Surgeon: Hanley Ben, MD;  Location: [facility];  Service: Urology;  Laterality: Left;  . Carotid endarterectomy Bilateral   . Aortobifemoral bypass grafting  1997    with R fem-pop  . Cardiac catheterization  12/2009    occluded vein to OM & ramus branches, patent vein to PDA, patent LIMA to LAD (Dr. [name], [facility]) - later had thrombectomy of R fem-pop bypass ad R common femoral & profunda femoris artery (Dr. Oneida Alar)  . Transthoracic echocardiogram  03/25/2010    EF 99991111, LV systolic function low normal with mild inferoseptal hypocontractility; LA mildly dilated; mod MR; mild TR, RV systolic pressure elevated, mild pulm HTN; AV mildly sclerotic; mild pulm valve regurg; aortic root sclerosis/calcif     Family History  Problem Relation Age of Onset  . Congestive Heart Failure Mother   . Diabetes Mother   . Stroke Father   . Cancer Maternal Aunt     Breast cancer    Social History:  reports that she quit smoking about 5 years ago. She has never used smokeless tobacco. She reports that she does not drink alcohol or use illicit drugs.  Review of Systems -    SHORTNESS of breath: She is not complaining of this now ?  Diastolic dysfunction previously present .  She is taking Lasix regularly without any symptoms or change in renal function or potassium   Also taking Lasix daily as this previously had helped her tendency to dyspnea  No recent edema  Cardiovascular ROS:  positive for - CAD and PVD  Hyperlipidemia: Has history of high LDL and triglycerides/low HDL  Was changed from Lipitor to Crestor because of higher LDL and she was not wanting to pay the higher cost of Zetia with the Lipitor  LDL is over 70 Tends  to have high triglycerides also despite taking fenofibrate No recurrence of liver dysfunction   Lab Results  Component Value Date   CHOL 179 07/22/2014   HDL 35.30* 07/22/2014   LDLCALC 49 09/13/2013   LDLDIRECT 91.0 07/22/2014   TRIG 328.0* 07/22/2014   CHOLHDL 5 07/22/2014   Lab Results  Component Value Date   ALT 34 10/18/2014    Iron Deficiency anemia:  Has had mild anemia with  persistently low hemoglobin and also iron deficiency. Takes iron  Every other day and hemoglobin is stable  Also taking B12.     Lab Results  Component Value Date   WBC 9.0 11/20/2014   HGB 11.1* 11/20/2014   HCT 34.3* 11/20/2014   MCV 86.7 11/20/2014   PLT 324.0 11/20/2014    Vitamin D deficiency: She has been on supplements and taking her weekly dose consistently     HYPOTHYROID and: Currently on 125 g  Lab Results  Component Value Date   TSH 1.59 11/20/2014   TSH 3.42 07/22/2014   TSH 2.07 04/24/2014   FREET4 1.33 07/22/2014   FREET4 1.29 04/24/2014   FREET4 1.12 01/23/2014       Examination:   BP 122/86 mmHg  Pulse 73  Temp(Src) 97.8 F (36.6 C) (Oral)  Ht 5\' 7"  (1.702 m)  Wt 163 lb (73.936 kg)  BMI 25.52 kg/m2  SpO2 97%  Body mass index is 25.52 kg/(m^2).     Repeat blood pressure 150/68 No pedal edema present No flank tenderness  Assesment/PLAN:    1. Persistent and recurrent UTI.  Currently has minimal symptoms but she thinks her urinalysis getting cloudy again.  Most likely she needs to be on prophylactic antibiotics, has not been given this by urologist.  Will check a urine culture today and decide on treatment further but meantime will start on trimethoprim at bedtime 2. DIABETES: She needs to start using new test strips as currently reading may be falsely low from using expired test strips.  Overall blood sugar control has been usually fairly good, fasting lab glucose 142.  She will call if she has unusually high or low blood sugars 3. RENAL dysfunction:  Appears to be better now, needs follow-up brain microalbumin 4. HYPERTENSION: Her blood pressure is unusually high today.  Since she has not had consistent increase in blood pressure before will check it again before starting her on medications 5. Anemia of chronic disease, stable.  She will continue B-12 and iron. 6. Hyperlipidemia: Will check fasting lipids on the next visit   [facility]  11/26/2014

## 2014-11-26 NOTE — Telephone Encounter (Signed)
error 

## 2014-11-27 LAB — URINE CULTURE: Organism ID, Bacteria: NO GROWTH

## 2014-11-28 NOTE — Progress Notes (Signed)
Quick Note:  Please let patient know that the urine culture result is normal and no further action needed ______

## 2014-12-01 ENCOUNTER — Other Ambulatory Visit: Payer: Self-pay | Admitting: Endocrinology

## 2014-12-02 DIAGNOSIS — N1330 Unspecified hydronephrosis: Secondary | ICD-10-CM | POA: Diagnosis not present

## 2014-12-02 DIAGNOSIS — R300 Dysuria: Secondary | ICD-10-CM | POA: Diagnosis not present

## 2014-12-02 DIAGNOSIS — N390 Urinary tract infection, site not specified: Secondary | ICD-10-CM | POA: Diagnosis not present

## 2014-12-24 ENCOUNTER — Other Ambulatory Visit: Payer: Self-pay | Admitting: Endocrinology

## 2014-12-25 ENCOUNTER — Other Ambulatory Visit: Payer: Self-pay | Admitting: *Deleted

## 2014-12-25 MED ORDER — HYDROCODONE-ACETAMINOPHEN 5-325 MG PO TABS: ORAL_TABLET | ORAL | Status: DC

## 2014-12-27 ENCOUNTER — Other Ambulatory Visit: Payer: Self-pay | Admitting: *Deleted

## 2014-12-27 ENCOUNTER — Other Ambulatory Visit (INDEPENDENT_AMBULATORY_CARE_PROVIDER_SITE_OTHER): Payer: Medicare Other

## 2014-12-27 DIAGNOSIS — IMO0002 Reserved for concepts with insufficient information to code with codable children: Secondary | ICD-10-CM

## 2014-12-27 DIAGNOSIS — E785 Hyperlipidemia, unspecified: Secondary | ICD-10-CM | POA: Diagnosis not present

## 2014-12-27 DIAGNOSIS — E1165 Type 2 diabetes mellitus with hyperglycemia: Secondary | ICD-10-CM | POA: Diagnosis not present

## 2014-12-27 DIAGNOSIS — I10 Essential (primary) hypertension: Secondary | ICD-10-CM | POA: Diagnosis not present

## 2014-12-27 LAB — LIPID PANEL
Cholesterol: 158 mg/dL (ref 0–200)
HDL: 28.6 mg/dL — AB (ref >39.00)
NonHDL: 129.4
Total CHOL/HDL Ratio: 6
Triglycerides: 247 mg/dL — ABNORMAL HIGH (ref 0.0–149.0)
VLDL: 49.4 mg/dL — ABNORMAL HIGH (ref 0.0–40.0)

## 2014-12-27 LAB — COMPREHENSIVE METABOLIC PANEL
ALBUMIN: 3.4 g/dL — AB (ref 3.5–5.2)
ALK PHOS: 56 U/L (ref 39–117)
ALT: 11 U/L (ref 0–35)
AST: 20 U/L (ref 0–37)
BILIRUBIN TOTAL: 0.5 mg/dL (ref 0.2–1.2)
BUN: 48 mg/dL — AB (ref 6–23)
CHLORIDE: 104 meq/L (ref 96–112)
CO2: 25 mEq/L (ref 19–32)
CREATININE: 2.94 mg/dL — AB (ref 0.40–1.20)
Calcium: 9.5 mg/dL (ref 8.4–10.5)
GFR: 16.61 mL/min — ABNORMAL LOW (ref >60.00)
Glucose, Bld: 154 mg/dL — ABNORMAL HIGH (ref 70–99)
Potassium: 4.3 mEq/L (ref 3.5–5.1)
SODIUM: 138 meq/L (ref 135–145)
TOTAL PROTEIN: 6.8 g/dL (ref 6.0–8.3)

## 2014-12-27 LAB — URINALYSIS, ROUTINE W REFLEX MICROSCOPIC
Bilirubin Urine: NEGATIVE
Ketones, ur: NEGATIVE
Nitrite: NEGATIVE
Specific Gravity, Urine: 1.01 (ref 1.000–1.030)
Total Protein, Urine: NEGATIVE
URINE GLUCOSE: NEGATIVE
UROBILINOGEN UA: 0.2 (ref 0.0–1.0)
pH: 6 (ref 5.0–8.0)

## 2014-12-27 LAB — MICROALBUMIN / CREATININE URINE RATIO
Creatinine,U: 60.2 mg/dL
MICROALB UR: 6.8 mg/dL — AB (ref 0.0–1.9)
Microalb Creat Ratio: 11.3 mg/g (ref 0.0–30.0)

## 2014-12-27 LAB — LDL CHOLESTEROL, DIRECT: LDL DIRECT: 83 mg/dL

## 2014-12-27 LAB — HEMOGLOBIN A1C: HEMOGLOBIN A1C: 5.6 % (ref 4.6–6.5)

## 2014-12-27 MED ORDER — CLOPIDOGREL BISULFATE 75 MG PO TABS: 75.0000 mg | ORAL_TABLET | Freq: Every morning | ORAL | Status: DC

## 2014-12-31 DIAGNOSIS — H40053 Ocular hypertension, bilateral: Secondary | ICD-10-CM | POA: Diagnosis not present

## 2015-01-02 ENCOUNTER — Other Ambulatory Visit: Payer: Self-pay | Admitting: *Deleted

## 2015-01-02 ENCOUNTER — Encounter: Payer: Self-pay | Admitting: Endocrinology

## 2015-01-02 ENCOUNTER — Ambulatory Visit (INDEPENDENT_AMBULATORY_CARE_PROVIDER_SITE_OTHER): Payer: Medicare Other | Admitting: Endocrinology

## 2015-01-02 VITALS — BP 132/60 | HR 76 | Temp 98.6°F | Resp 16 | Ht 67.0 in | Wt 155.8 lb

## 2015-01-02 DIAGNOSIS — I6523 Occlusion and stenosis of bilateral carotid arteries: Secondary | ICD-10-CM

## 2015-01-02 DIAGNOSIS — E038 Other specified hypothyroidism: Secondary | ICD-10-CM

## 2015-01-02 DIAGNOSIS — Z23 Encounter for immunization: Secondary | ICD-10-CM | POA: Diagnosis not present

## 2015-01-02 DIAGNOSIS — M545 Low back pain, unspecified: Secondary | ICD-10-CM

## 2015-01-02 DIAGNOSIS — N179 Acute kidney failure, unspecified: Secondary | ICD-10-CM | POA: Diagnosis not present

## 2015-01-02 DIAGNOSIS — E119 Type 2 diabetes mellitus without complications: Secondary | ICD-10-CM | POA: Diagnosis not present

## 2015-01-02 DIAGNOSIS — N189 Chronic kidney disease, unspecified: Secondary | ICD-10-CM

## 2015-01-02 DIAGNOSIS — E782 Mixed hyperlipidemia: Secondary | ICD-10-CM

## 2015-01-02 DIAGNOSIS — E063 Autoimmune thyroiditis: Secondary | ICD-10-CM

## 2015-01-02 MED ORDER — INSULIN PEN NEEDLE 32G X 4 MM MISC: Status: DC

## 2015-01-02 MED ORDER — HYDROCODONE-ACETAMINOPHEN 5-325 MG PO TABS: ORAL_TABLET | ORAL | Status: DC

## 2015-01-02 NOTE — Progress Notes (Signed)
Patient ID: CHYAN LAGE, female   DOB: [birth year], 74 y.o.   MRN: [id number]   Reason for Appointment:  Multiple problems, follow-up  History of Present Illness    RENAL dysfunction: Her creatinine is unusually high at 2.9 She has not taken any ibuprofen or Aleve for pain and has not had any recent infections No change in urinary pattern Previously had reduced left renal function on studies but no clear-cut hydronephrosis  Lab Results  Component Value Date   CREATININE 2.94* 12/27/2014   CREATININE 1.43* 11/20/2014   CREATININE 1.71* 10/18/2014   CREATININE 2.04* 10/17/2014     URINARY tract infection:  She previously had pyelonephritis and urine culture done showed Escherichia coli sensitive to all organisms Her urinalysis does not show as much pyuria and is clear She is now on trimethoprim prophylaxis  OTHER active problems:    Type 2 diabetes mellitus, date of diagnosis: 1986.   The insulin regimen is: Levemir 22 units hs, Novolog 8 ac twice a day   Type 2 diabetes  has been treated in the last few years with low dose basal bolus insulin regimen. She has not been taking  any oral hypoglycemic drugs including metformin partly because of GI side effects  Her blood sugars are all nearly normal recently  A1c significantly better No hypoglycemia Taking insulin fairly consistently including before meals  Side effects from medications: Diarrhea from metformin and nausea and vomiting from GLP-1 drugs   Monitors blood glucose: Less than 1 times a day  Glucometer: One Touch.  Blood Glucose readings: Recent fasting range 89-157, afternoon 90-211 with only one high reading Overall average 119  Hypoglycemia: None recently.   Meals: she is usually eating low fat meals but is sometimes eating out at lunch; meals usually at 11 AM,and supper at 5 pm.  Calorie intake: Usually controlled. Moderate Carbs Physical activity: exercise: Unable to do any and also not  motivated  Dietician visit: Most recent:, 5/13.   Wt Readings from Last 3 Encounters:  01/02/15 155 lb 12.8 oz (70.67 kg)  11/25/14 163 lb (73.936 kg)  10/25/14 166 lb (75.297 kg)    Lab Results  Component Value Date   HGBA1C 5.6 12/27/2014   HGBA1C 6.6 10/25/2014   HGBA1C 6.4 07/22/2014   Lab Results  Component Value Date   MICROALBUR 6.8* 12/27/2014   LDLCALC 49 09/13/2013   CREATININE 2.94* 12/27/2014      2. CKD: Her creatinine was persistently high but appears to be better now Previously on low dose Tekturna for nephropathy which was stopped Does not have any significant proteinuria lately, has a mild increase in urine microalbumin in the 40s  Lab Results  Component Value Date   CREATININE 2.94* 12/27/2014   CREATININE 1.43* 11/20/2014   CREATININE 1.71* 10/18/2014    3.  HYPOTHYROIDISM: She has had long-standing primary hypothyroidism TSH has been fairly consistent this year, previously had been variable and her dose was increased in 11/15 to 125 g. She does not complain of any fatigue  Lab Results  Component Value Date   TSH 1.59 11/20/2014   TSH 3.42 07/22/2014   TSH 2.07 04/24/2014   FREET4 1.33 07/22/2014   FREET4 1.29 04/24/2014   FREET4 1.12 01/23/2014          Medication List       This list is accurate as of: 01/02/15  4:51 PM.  Always use your most recent med list.  albuterol 108 (90 BASE) MCG/ACT inhaler  Commonly known as:  PROVENTIL HFA;VENTOLIN HFA  Inhale 2 puffs into the lungs every 6 (six) hours as needed for wheezing.     aspirin 81 MG chewable tablet  Chew 162 mg by mouth at bedtime.     calcium-vitamin D 500-200 MG-UNIT tablet  Commonly known as:  OSCAL WITH D  Take 1 tablet by mouth every morning.     clopidogrel 75 MG tablet  Commonly known as:  PLAVIX  Take 1 tablet (75 mg total) by mouth every morning.     Cranberry 250 MG Tabs  Take by mouth.     CRESTOR 20 MG tablet  Generic drug:   rosuvastatin  take 1 tablet by mouth once daily     diphenoxylate-atropine 2.5-0.025 MG tablet  Commonly known as:  LOMOTIL  Take 1 tablet by mouth 3 (three) times daily.     fenofibrate micronized 67 MG capsule  Commonly known as:  LOFIBRA  Take 1 capsule (67 mg total) by mouth daily before breakfast.     ferrous sulfate 325 (65 FE) MG tablet  Take 325 mg by mouth 3 (three) times a week.     furosemide 20 MG tablet  Commonly known as:  LASIX  take 1 tablet by mouth once daily     glucose blood test strip  TEST AS DIRECTED 3 TIMES DAILY     glucose blood test strip  Commonly known as:  ONE TOUCH ULTRA TEST  Pt is to be checking blood sugar 1-2 times a daily     HYDROcodone-acetaminophen 5-325 MG tablet  Commonly known as:  NORCO/VICODIN  Take 1 tablet every 6 hours as needed for back pain     insulin aspart 100 UNIT/ML FlexPen  Commonly known as:  NOVOLOG  Inject 8 units three times a day with meals     Insulin Detemir 100 UNIT/ML Pen  Commonly known as:  LEVEMIR FLEXTOUCH  Inject 22 units at bedtime.     Insulin Pen Needle 32G X 4 MM Misc  Commonly known as:  BD PEN NEEDLE NANO U/F  Use 3 needles per day     latanoprost 0.005 % ophthalmic solution  Commonly known as:  XALATAN  Place 1 drop into both eyes at bedtime.     levothyroxine 125 MCG tablet  Commonly known as:  SYNTHROID, LEVOTHROID  Take 1 tablet (125 mcg total) by mouth daily.     magnesium oxide 400 MG tablet  Commonly known as:  MAG-OX  Take 2 tablets (800 mg total) by mouth at bedtime.     metoprolol succinate 25 MG 24 hr tablet  Commonly known as:  TOPROL-XL  TAKE 1/2 TABLET (=12.5 MG) EVERY MORNING     omeprazole 20 MG capsule  Commonly known as:  PRILOSEC  TAKE 1 CAPSULE DAILY     OVER THE COUNTER MEDICATION  Place 1 patch onto the skin once as needed (pain.). Transdermal "aspirin" patch.     PROBIOTIC DAILY PO  Take by mouth.     trimethoprim 100 MG tablet  Commonly known as:   TRIMPEX  Take 1 tablet (100 mg total) by mouth daily.     vitamin B-12 1000 MCG tablet  Commonly known as:  CYANOCOBALAMIN  Take 1,000 mcg by mouth every morning.     vitamin C 500 MG tablet  Commonly known as:  ASCORBIC ACID  Take 500 mg by mouth every morning.     Vitamin D (  Ergocalciferol) 50000 UNITS Caps capsule  Commonly known as:  DRISDOL  Take 1 capsule (50,000 Units total) by mouth every 7 (seven) days. On thursdays        Allergies: No Known Allergies  Past Medical History  Diagnosis Date  . Diabetes mellitus without complication ([place])     type 2  . CHF (congestive heart failure) ([place])   . Coronary artery disease   . AAA (abdominal aortic aneurysm) ([place]) 03/09/2012  . PVD (peripheral vascular disease) ([place])   . Carotid artery stenosis     carotid doppler 06/2012 - Right CCA/Bulb/ICA with chronic occlusion; L vertebral artery with abnormal blood flow; L Bulb/Prox ICA  s/p endarterectomy with mild fibrous plaque, 50% diameter reduction  . Hypertension   . Hyperlipidemia   . Hypothyroidism   . History of nuclear stress test 11/24/2011    lexiscan; nonischemic, low risk   . PAD (peripheral artery disease) ([place])     09/2010 LEAs - R ABI of 0.45, occluded fem-pop bypass graft, L ABI of 0.59 with occluded SFA; severe arterial insuff  . Ischemic cardiomyopathy     EF 03/2010 50-55%    Past Surgical History  Procedure Laterality Date  . Coronary artery bypass graft  1997    x6; internal mammary to LAD, SVG to ramus #1 & #2, SVG to OM, SVG to PDA,   . Abdominal hysterectomy  1986  . Tumor removal    . Coronary stent placement    . Femoral artery repair    . Cystoscopy w/ ureteral stent placement  03/10/2012    Procedure: CYSTOSCOPY WITH RETROGRADE PYELOGRAM/URETERAL STENT PLACEMENT;  Surgeon: Hanley Ben, MD;  Location: [facility];  Service: Urology;  Laterality: Left;  . Carotid endarterectomy Bilateral   . Aortobifemoral bypass grafting  1997    with R fem-pop  .  Cardiac catheterization  12/2009    occluded vein to OM & ramus branches, patent vein to PDA, patent LIMA to LAD (Dr. [name], [facility]) - later had thrombectomy of R fem-pop bypass ad R common femoral & profunda femoris artery (Dr. Oneida Alar)  . Transthoracic echocardiogram  03/25/2010    EF 99991111, LV systolic function low normal with mild inferoseptal hypocontractility; LA mildly dilated; mod MR; mild TR, RV systolic pressure elevated, mild pulm HTN; AV mildly sclerotic; mild pulm valve regurg; aortic root sclerosis/calcif     Family History  Problem Relation Age of Onset  . Congestive Heart Failure Mother   . Diabetes Mother   . Stroke Father   . Cancer Maternal Aunt     Breast cancer    Social History:  reports that she quit smoking about 5 years ago. She has never used smokeless tobacco. She reports that she does not drink alcohol or use illicit drugs.  Review of Systems -    LOW BACK PAIN:  She is having much less low pain now and she is convinced that this was related to urinary tract infection She still needs some hydrocodone but because she was previously taking 3 a day instead of 2 a day she ran out and is taking only Tylenol Previously has had epidural steroids which has reduced her radiculopathy Has not seen orthopedic surgeon lately  ?  Diastolic dysfunction; previously present .   She is taking Lasix regularly without any symptoms No recent edema   Hyperlipidemia: Has history of high LDL and triglycerides/low HDL  Was changed from Lipitor to Crestor because of higher LDL and she was not wanting  to pay the higher cost of Zetia with the Lipitor  LDL is still over 70 Tends to have high triglycerides also despite taking fenofibrate No recurrence of liver dysfunction   Lab Results  Component Value Date   CHOL 158 12/27/2014   HDL 28.60* 12/27/2014   LDLCALC 49 09/13/2013   LDLDIRECT 83.0 12/27/2014   TRIG 247.0* 12/27/2014   CHOLHDL 6 12/27/2014   Lab Results    Component Value Date   ALT 11 12/27/2014    Iron Deficiency anemia:  Has had mild anemia with  persistently low hemoglobin and also iron deficiency. Takes iron  Every other day and hemoglobin is stable  Also taking B12.     Lab Results  Component Value Date   WBC 9.0 11/20/2014   HGB 11.1* 11/20/2014   HCT 34.3* 11/20/2014   MCV 86.7 11/20/2014   PLT 324.0 11/20/2014    Vitamin D deficiency: She has been on supplements and taking her weekly dose consistently     HYPOTHYROID and: Currently on 125 g with consistent levels of TSH  Lab Results  Component Value Date   TSH 1.59 11/20/2014   TSH 3.42 07/22/2014   TSH 2.07 04/24/2014   FREET4 1.33 07/22/2014   FREET4 1.29 04/24/2014   FREET4 1.12 01/23/2014    DIARRHEA: She has had 5-6 very loose stools for the last 3-4 days and has not taken any medication for this.  No abdominal pain or fever   Examination:   BP 132/60 mmHg  Pulse 76  Temp(Src) 98.6 F (37 C)  Resp 16  Ht 5\' 7"  (1.702 m)  Wt 155 lb 12.8 oz (70.67 kg)  BMI 24.40 kg/m2  SpO2 96%  Body mass index is 24.4 kg/(m^2).    Blood pressure was normal standing up No pedal edema present  Assesment/PLAN:    1. RENAL dysfunction: Not clear why her creatinine is much higher recently.  She is not on any nephrotoxic drugs.  Will need to evaluate with renal ultrasound.  Will have her stop fenofibrate and trimethoprim temporarily.  Consider nephrology or urology consultation as needed.  Also will schedule follow-up in 2 weeks  2.  History of recurrent UTI.  Currently has no symptoms and her urinalysis looks much improved and she is doing fairly well with using trimethoprim.    3.  DIABETES: Her control has been excellent and may be getting lower fasting readings with her renal function being worse She can reduce her Levemir to 22 units Needs to do more readings after meals  4.  HYPOTHYROID: Adequately replaced and will continue to monitor periodically  5.   Recent diarrhea.  She will try Imodium OTC and stop probiotics.  Not clear of the etiology and she has had previous self-limiting diarrhea also.  6.  Persistent high triglycerides.  May consider fish oil  Total visit time evaluating multiple problems, labs and counseling = 25 minutes  Ethne Jeon  01/02/2015

## 2015-01-02 NOTE — Patient Instructions (Addendum)
Stop Fenofibrate and Trimethoprim  Levemir 20 units  Take Imodium otc, stop Probiotic capsules

## 2015-01-03 ENCOUNTER — Telehealth: Payer: Self-pay | Admitting: *Deleted

## 2015-01-03 NOTE — Telephone Encounter (Signed)
Patient called today about a dx she seen on her Avs, she said it says Chronic Kidney failure.  She wants to know is she in Kidney failure? Also she wants you to know that she is on lasix for shortness of breath and she knows that has an effect on her kidneys. Please advise

## 2015-01-03 NOTE — Telephone Encounter (Signed)
Noted  

## 2015-01-03 NOTE — Telephone Encounter (Signed)
Explained to patient

## 2015-01-08 ENCOUNTER — Encounter: Payer: Self-pay | Admitting: Cardiovascular Disease

## 2015-01-08 ENCOUNTER — Ambulatory Visit (INDEPENDENT_AMBULATORY_CARE_PROVIDER_SITE_OTHER): Payer: Medicare Other | Admitting: Cardiovascular Disease

## 2015-01-08 VITALS — BP 156/68 | HR 80 | Ht 67.0 in | Wt 157.0 lb

## 2015-01-08 DIAGNOSIS — I2583 Coronary atherosclerosis due to lipid rich plaque: Secondary | ICD-10-CM

## 2015-01-08 DIAGNOSIS — I779 Disorder of arteries and arterioles, unspecified: Secondary | ICD-10-CM | POA: Diagnosis not present

## 2015-01-08 DIAGNOSIS — E785 Hyperlipidemia, unspecified: Secondary | ICD-10-CM | POA: Diagnosis not present

## 2015-01-08 DIAGNOSIS — I6523 Occlusion and stenosis of bilateral carotid arteries: Secondary | ICD-10-CM | POA: Diagnosis not present

## 2015-01-08 DIAGNOSIS — I739 Peripheral vascular disease, unspecified: Secondary | ICD-10-CM

## 2015-01-08 DIAGNOSIS — I2510 Atherosclerotic heart disease of native coronary artery without angina pectoris: Secondary | ICD-10-CM | POA: Diagnosis not present

## 2015-01-08 DIAGNOSIS — I10 Essential (primary) hypertension: Secondary | ICD-10-CM

## 2015-01-08 NOTE — Assessment & Plan Note (Signed)
History of coronary artery disease status post coronary artery bypass grafting with 7 grafts placed in 1997. She will quite catheterization by Dr. [name] 12/09/09 revealing an occluded vein to obtuse marginal branch and ramus branch with a patent vein to the PDA and patent LIMA to the LAD. Her EF at that time was 40%. She had a Myoview performed 11/24/11 which was nonischemic. She currently denies chest pain but does get dyspnea on exertion.

## 2015-01-08 NOTE — Progress Notes (Signed)
01/08/2015 [name] Gouin   1940/12/16  [id number]  Primary Physician [name], MD Primary Cardiologist: [name] MD [name]   HPI:  The patient is a 74 year old, mildly overweight, divorced Caucasian female, mother of 1 child who I last saw in the office 12 months ago. She has a history of CAD status post coronary artery bypass grafting x6 in 1997. She has had bilateral carotid endarterectomies, aortobifemoral bypass grafting and right fem-pop bypass grafting with lifestyle-limiting claudication. Cath performed by Dr. [name] December 09, 2009, revealed an occluded vein to an OM and ramus branches with a patent vein to a PDA and a patent LIMA to the LAD. Her EF was 40% at that time. She quit smoking November 2011. She had an echo performed March 25, 2010, that showed normal LV systolic function and a Myoview performed November 24, 2011, which was nonischemic. She denies chest pain or shortness of breath. Her last lower extremity Dopplers performed in our office October 05, 2011, revealed a right ABI of 0.45 with an occluded fem-pop bypass graft and a left ABI of 0.59 with an occluded left SFA.since I saw her back 7 months ago she's remained clinically stable. She denies chest pain or shortness of breath. Her lower extremity arterial Dopplers show progression of disease on the right but though I do not think she is revascularizable given her comorbidities.   Current Outpatient Prescriptions  Medication Sig Dispense Refill  . albuterol (PROVENTIL HFA;VENTOLIN HFA) 108 (90 BASE) MCG/ACT inhaler Inhale 2 puffs into the lungs every 6 (six) hours as needed for wheezing. 1 Inhaler 5  . aspirin 81 MG chewable tablet Chew 162 mg by mouth at bedtime.     . calcium-vitamin D (OSCAL WITH D) 500-200 MG-UNIT per tablet Take 1 tablet by mouth every morning.    . clopidogrel (PLAVIX) 75 MG tablet Take 1 tablet (75 mg total) by mouth every morning. 90 tablet 1  . Cranberry 250 MG TABS  Take by mouth.    . CRESTOR 20 MG tablet take 1 tablet by mouth once daily 90 tablet 1  . ferrous sulfate 325 (65 FE) MG tablet Take 325 mg by mouth 3 (three) times a week.     . furosemide (LASIX) 20 MG tablet take 1 tablet by mouth once daily 30 tablet 5  . glucose blood (ONE TOUCH ULTRA TEST) test strip Pt is to be checking blood sugar 1-2 times a daily 100 each 0  . glucose blood test strip TEST AS DIRECTED 3 TIMES DAILY 100 each 4  . HYDROcodone-acetaminophen (NORCO/VICODIN) 5-325 MG tablet Take 1 tablet every 6 hours as needed for back pain 120 tablet 0  . insulin aspart (NOVOLOG) 100 UNIT/ML FlexPen Inject 8 units three times a day with meals 15 mL 5  . Insulin Detemir (LEVEMIR FLEXTOUCH) 100 UNIT/ML Pen Inject 22 units at bedtime. 30 mL 1  . Insulin Pen Needle (BD PEN NEEDLE NANO U/F) 32G X 4 MM MISC Use 3 needles per day 300 each 1  . latanoprost (XALATAN) 0.005 % ophthalmic solution Place 1 drop into both eyes at bedtime.  0  . levothyroxine (SYNTHROID, LEVOTHROID) 125 MCG tablet Take 1 tablet (125 mcg total) by mouth daily. 90 tablet 3  . magnesium oxide (MAG-OX) 400 MG tablet Take 2 tablets (800 mg total) by mouth at bedtime. 90 tablet 2  . metoprolol succinate (TOPROL-XL) 25 MG 24 hr tablet TAKE 1/2 TABLET (=12.5 MG) EVERY MORNING 90 tablet 3  .  omeprazole (PRILOSEC) 20 MG capsule TAKE 1 CAPSULE DAILY 90 capsule 3  . OVER THE COUNTER MEDICATION Place 1 patch onto the skin once as needed (pain.). Transdermal "aspirin" patch.    . vitamin C (ASCORBIC ACID) 500 MG tablet Take 500 mg by mouth every morning.    . Vitamin D, Ergocalciferol, (DRISDOL) 50000 UNITS CAPS capsule Take 1 capsule (50,000 Units total) by mouth every 7 (seven) days. On thursdays 4 capsule 5   No current facility-administered medications for this visit.    No Known Allergies  Social History   Social History  . Marital Status: Divorced    Spouse Name: N/A  . Number of Children: 1  . Years of Education: 12     Occupational History  . Not on file.   Social History Main Topics  . Smoking status: Former Smoker -- 2.00 packs/day for 25 years    Quit date: 06/07/2009  . Smokeless tobacco: Never Used  . Alcohol Use: No  . Drug Use: No  . Sexual Activity: Not on file   Other Topics Concern  . Not on file   Social History Narrative     Review of Systems: General: negative for chills, fever, night sweats or weight changes.  Cardiovascular: negative for chest pain, dyspnea on exertion, edema, orthopnea, palpitations, paroxysmal nocturnal dyspnea or shortness of breath Dermatological: negative for rash Respiratory: negative for cough or wheezing Urologic: negative for hematuria Abdominal: negative for nausea, vomiting, diarrhea, bright red blood per rectum, melena, or hematemesis Neurologic: negative for visual changes, syncope, or dizziness All other systems reviewed and are otherwise negative except as noted above.    Blood pressure 156/68, pulse 80, height 5\' 7"  (1.702 m), weight 157 lb (71.215 kg).  General appearance: alert and no distress Neck: no adenopathy, no JVD, supple, symmetrical, trachea midline, thyroid not enlarged, symmetric, no tenderness/mass/nodules and loud left carotid bruit Lungs: clear to auscultation bilaterally Heart: regular rate and rhythm, S1, S2 normal, no murmur, click, rub or gallop Extremities: extremities normal, atraumatic, no cyanosis or edema  EKG not performed today  ASSESSMENT AND PLAN:   Hyperlipidemia History of hyperlipidemia on Crestor 20 mg a day with recent lipid profile performed 12/27/14 revealed a total cholesterol 158 ventricular triglycerides level of 247  HTN (hypertension) History of hypertension with blood pressure measured at 156/68. She is on metoprolol. Continue current meds at current dosing  Claudication in peripheral vascular disease History of peripheral arterial disease with Dopplers revealing ABIs in the 0.5 range with an  occluded right femoropopliteal bypass graft and left SFA. She really denies claudication  Carotid artery disease History of carotid artery disease status post remote bilateral carotid endarterectomies with recent carotid Dopplers performed 06/27/14 revealing an occluded right carotid with mild to moderate left ICA stenosis. This is followed basis      [name] MD [facility], [facility] 01/08/2015 2:52 PM

## 2015-01-08 NOTE — Assessment & Plan Note (Signed)
History of hyperlipidemia on Crestor 20 mg a day with recent lipid profile performed 12/27/14 revealed a total cholesterol 158 ventricular triglycerides level of 247

## 2015-01-08 NOTE — Assessment & Plan Note (Signed)
History of hypertension with blood pressure measured at 156/68. She is on metoprolol. Continue current meds at current dosing

## 2015-01-08 NOTE — Assessment & Plan Note (Signed)
History of peripheral arterial disease with Dopplers revealing ABIs in the 0.5 range with an occluded right femoropopliteal bypass graft and left SFA. She really denies claudication

## 2015-01-08 NOTE — Patient Instructions (Signed)

## 2015-01-08 NOTE — Assessment & Plan Note (Signed)
History of carotid artery disease status post remote bilateral carotid endarterectomies with recent carotid Dopplers performed 06/27/14 revealing an occluded right carotid with mild to moderate left ICA stenosis. This is followed basis

## 2015-01-09 ENCOUNTER — Telehealth: Payer: Self-pay | Admitting: Endocrinology

## 2015-01-09 ENCOUNTER — Ambulatory Visit
Admission: RE | Admit: 2015-01-09 | Discharge: 2015-01-09 | Disposition: A | Discharge status: Home / Self Care | Payer: Medicare Other | Source: Ambulatory Visit | Attending: Endocrinology | Admitting: Endocrinology

## 2015-01-09 DIAGNOSIS — N179 Acute kidney failure, unspecified: Secondary | ICD-10-CM

## 2015-01-09 DIAGNOSIS — N189 Chronic kidney disease, unspecified: Principal | ICD-10-CM

## 2015-01-09 DIAGNOSIS — N1330 Unspecified hydronephrosis: Secondary | ICD-10-CM | POA: Diagnosis not present

## 2015-01-09 NOTE — Telephone Encounter (Signed)
Needs to be seen urgently by her urologist Dr. Louis Meckel

## 2015-01-09 NOTE — Telephone Encounter (Signed)
Patients U/S report is back, they want you to look at it.

## 2015-01-09 NOTE — Telephone Encounter (Signed)
Cassandra calling to give you the results of patient ultra sound. [phone]

## 2015-01-13 ENCOUNTER — Other Ambulatory Visit: Payer: Self-pay | Admitting: *Deleted

## 2015-01-13 MED ORDER — INSULIN DETEMIR 100 UNIT/ML FLEXPEN: PEN_INJECTOR | SUBCUTANEOUS | Status: DC

## 2015-01-14 ENCOUNTER — Other Ambulatory Visit: Payer: Self-pay | Admitting: Urology

## 2015-01-14 DIAGNOSIS — N1330 Unspecified hydronephrosis: Secondary | ICD-10-CM | POA: Diagnosis not present

## 2015-01-17 ENCOUNTER — Other Ambulatory Visit (INDEPENDENT_AMBULATORY_CARE_PROVIDER_SITE_OTHER): Payer: Medicare Other

## 2015-01-17 DIAGNOSIS — N189 Chronic kidney disease, unspecified: Secondary | ICD-10-CM

## 2015-01-17 DIAGNOSIS — N179 Acute kidney failure, unspecified: Secondary | ICD-10-CM

## 2015-01-17 LAB — BASIC METABOLIC PANEL
BUN: 30 mg/dL — ABNORMAL HIGH (ref 6–23)
CALCIUM: 9.4 mg/dL (ref 8.4–10.5)
CO2: 27 meq/L (ref 19–32)
Chloride: 106 mEq/L (ref 96–112)
Creatinine, Ser: 1.76 mg/dL — ABNORMAL HIGH (ref 0.40–1.20)
GFR: 30.02 mL/min — ABNORMAL LOW (ref >60.00)
Glucose, Bld: 82 mg/dL (ref 70–99)
POTASSIUM: 3.6 meq/L (ref 3.5–5.1)
SODIUM: 143 meq/L (ref 135–145)

## 2015-01-20 ENCOUNTER — Encounter: Payer: Self-pay | Admitting: Endocrinology

## 2015-01-20 ENCOUNTER — Ambulatory Visit (INDEPENDENT_AMBULATORY_CARE_PROVIDER_SITE_OTHER): Payer: Medicare Other | Admitting: Endocrinology

## 2015-01-20 VITALS — BP 126/74 | HR 72 | Temp 98.3°F | Resp 14 | Ht 67.0 in | Wt 159.4 lb

## 2015-01-20 DIAGNOSIS — E119 Type 2 diabetes mellitus without complications: Secondary | ICD-10-CM | POA: Diagnosis not present

## 2015-01-20 DIAGNOSIS — E038 Other specified hypothyroidism: Secondary | ICD-10-CM | POA: Diagnosis not present

## 2015-01-20 DIAGNOSIS — E559 Vitamin D deficiency, unspecified: Secondary | ICD-10-CM

## 2015-01-20 DIAGNOSIS — I6523 Occlusion and stenosis of bilateral carotid arteries: Secondary | ICD-10-CM | POA: Diagnosis not present

## 2015-01-20 DIAGNOSIS — N183 Chronic kidney disease, stage 3 unspecified: Secondary | ICD-10-CM

## 2015-01-20 DIAGNOSIS — E063 Autoimmune thyroiditis: Secondary | ICD-10-CM

## 2015-01-20 NOTE — Patient Instructions (Signed)
Add more potasssium to diet

## 2015-01-20 NOTE — Progress Notes (Signed)
Patient ID: [name] [name], female   DOB: Apr 15, 1940, 74 y.o.   MRN: [id number]   Reason for Appointment:  Multiple problems, follow-up  History of Present Illness    RENAL dysfunction: Her creatinine was unusually high at 2.9 in 10/16 Her renal ultrasound revealed bilateral hydronephrosis and she is being evaluated by her urologist She is due for a study at the hospital, details unavailable No recent episode of UTI However her creatinine appears to be spontaneously improved  Lab Results  Component Value Date   CREATININE 1.76* 01/17/2015   CREATININE 2.94* 12/27/2014   CREATININE 1.43* 11/20/2014   CREATININE 1.71* 10/18/2014    URINARY tract infection:  She previously had pyelonephritis and urine culture done showed Escherichia coli sensitive to all organisms She is now not on trimethoprim prophylaxis since her renal dysfunction started, has followed up with urologist  OTHER active problems:    Type 2 diabetes mellitus, date of diagnosis: 1986.   The insulin regimen is: Levemir 20 units hs, Novolog 8 ac twice a day   Type 2 diabetes  has been treated in the last few years with low dose basal bolus insulin regimen. She has not been taking  any oral hypoglycemic drugs including metformin partly because of GI side effects  A1c recently was significantly better No hypoglycemia although she felt a little weak when her blood sugar is 92, blood days ago Taking insulin fairly consistently including before meals On her last visit her Levemir was reduced, previously had been taking as much as 26 units   Side effects from medications: Diarrhea from metformin and nausea and vomiting from GLP-1 drugs   Monitors blood glucose: Less than 1 times a day  Glucometer: One Touch.  Blood Glucose readings:   Mean values apply above for all meters except median for One Touch  PRE-MEAL Fasting Lunch  afternoon  Bedtime Overall  Glucose range:  93-159    101-211   76-231      Mean/median:  124      132+/-38    Hypoglycemia: None recently.   Meals: she is usually eating low fat meals but is sometimes eating out at lunch; meals usually at 11 AM,and supper at 5 pm.  Calorie intake: Usually controlled. Moderate Carbs Physical activity: exercise: Unable to do any and also not motivated  Dietician visit: Most recent:, 5/13.   Wt Readings from Last 3 Encounters:  01/20/15 159 lb 6.4 oz (72.303 kg)  01/08/15 157 lb (71.215 kg)  01/02/15 155 lb 12.8 oz (70.67 kg)    Lab Results  Component Value Date   HGBA1C 5.6 12/27/2014   HGBA1C 6.6 10/25/2014   HGBA1C 6.4 07/22/2014   Lab Results  Component Value Date   MICROALBUR 6.8* 12/27/2014   LDLCALC 49 09/13/2013   CREATININE 1.76* 01/17/2015      2. CKD: Her creatinine was persistently high but appears to be better now Previously on low dose Tekturna for nephropathy which was stopped Does not have any significant proteinuria lately, has a mild increase in urine microalbumin in the 40s  Lab Results  Component Value Date   CREATININE 1.76* 01/17/2015   CREATININE 2.94* 12/27/2014   CREATININE 1.43* 11/20/2014    3.  HYPOTHYROIDISM: She has had long-standing primary hypothyroidism TSH has been fairly consistent this year, previously had been variable and her dose was increased in 11/15 to 125 g. She does not complain of any fatigue  Lab Results  Component Value Date  TSH 1.59 11/20/2014   TSH 3.42 07/22/2014   TSH 2.07 04/24/2014   FREET4 1.33 07/22/2014   FREET4 1.29 04/24/2014   FREET4 1.12 01/23/2014          Medication List       This list is accurate as of: 01/20/15  4:47 PM.  Always use your most recent med list.               albuterol 108 (90 BASE) MCG/ACT inhaler  Commonly known as:  PROVENTIL HFA;VENTOLIN HFA  Inhale 2 puffs into the lungs every 6 (six) hours as needed for wheezing.     aspirin 81 MG chewable tablet  Chew 162 mg by mouth at bedtime.      calcium-vitamin D 500-200 MG-UNIT tablet  Commonly known as:  OSCAL WITH D  Take 1 tablet by mouth every morning.     clopidogrel 75 MG tablet  Commonly known as:  PLAVIX  Take 1 tablet (75 mg total) by mouth every morning.     Cranberry 250 MG Tabs  Take by mouth.     CRESTOR 20 MG tablet  Generic drug:  rosuvastatin  take 1 tablet by mouth once daily     ferrous sulfate 325 (65 FE) MG tablet  Take 325 mg by mouth 3 (three) times a week.     furosemide 20 MG tablet  Commonly known as:  LASIX  take 1 tablet by mouth once daily     glucose blood test strip  TEST AS DIRECTED 3 TIMES DAILY     glucose blood test strip  Commonly known as:  ONE TOUCH ULTRA TEST  Pt is to be checking blood sugar 1-2 times a daily     HYDROcodone-acetaminophen 5-325 MG tablet  Commonly known as:  NORCO/VICODIN  Take 1 tablet every 6 hours as needed for back pain     insulin aspart 100 UNIT/ML FlexPen  Commonly known as:  NOVOLOG  Inject 8 units three times a day with meals     Insulin Detemir 100 UNIT/ML Pen  Commonly known as:  LEVEMIR FLEXTOUCH  Inject 22 units at bedtime.     Insulin Pen Needle 32G X 4 MM Misc  Commonly known as:  BD PEN NEEDLE NANO U/F  Use 3 needles per day     latanoprost 0.005 % ophthalmic solution  Commonly known as:  XALATAN  Place 1 drop into both eyes at bedtime.     levothyroxine 125 MCG tablet  Commonly known as:  SYNTHROID, LEVOTHROID  Take 1 tablet (125 mcg total) by mouth daily.     magnesium oxide 400 MG tablet  Commonly known as:  MAG-OX  Take 2 tablets (800 mg total) by mouth at bedtime.     metoprolol succinate 25 MG 24 hr tablet  Commonly known as:  TOPROL-XL  TAKE 1/2 TABLET (=12.5 MG) EVERY MORNING     omeprazole 20 MG capsule  Commonly known as:  PRILOSEC  TAKE 1 CAPSULE DAILY     OVER THE COUNTER MEDICATION  Place 1 patch onto the skin once as needed (pain.). Transdermal "aspirin" patch.     vitamin C 500 MG tablet  Commonly known  as:  ASCORBIC ACID  Take 500 mg by mouth every morning.     Vitamin D (Ergocalciferol) 50000 UNITS Caps capsule  Commonly known as:  DRISDOL  Take 1 capsule (50,000 Units total) by mouth every 7 (seven) days. On thursdays  Allergies: No Known Allergies  Past Medical History  Diagnosis Date  . Diabetes mellitus without complication ([place])     type 2  . CHF (congestive heart failure) ([place])   . Coronary artery disease   . AAA (abdominal aortic aneurysm) ([place]) 03/09/2012  . PVD (peripheral vascular disease) ([place])   . Carotid artery stenosis     carotid doppler 06/2012 - Right CCA/Bulb/ICA with chronic occlusion; L vertebral artery with abnormal blood flow; L Bulb/Prox ICA  s/p endarterectomy with mild fibrous plaque, 50% diameter reduction  . Hypertension   . Hyperlipidemia   . Hypothyroidism   . History of nuclear stress test 11/24/2011    lexiscan; nonischemic, low risk   . PAD (peripheral artery disease) ([place])     09/2010 LEAs - R ABI of 0.45, occluded fem-pop bypass graft, L ABI of 0.59 with occluded SFA; severe arterial insuff  . Ischemic cardiomyopathy     EF 03/2010 50-55%    Past Surgical History  Procedure Laterality Date  . Coronary artery bypass graft  1997    x6; internal mammary to LAD, SVG to ramus #1 & #2, SVG to OM, SVG to PDA,   . Abdominal hysterectomy  1986  . Tumor removal    . Coronary stent placement    . Femoral artery repair    . Cystoscopy w/ ureteral stent placement  03/10/2012    Procedure: CYSTOSCOPY WITH RETROGRADE PYELOGRAM/URETERAL STENT PLACEMENT;  Surgeon: Hanley Ben, MD;  Location: [facility];  Service: Urology;  Laterality: Left;  . Carotid endarterectomy Bilateral   . Aortobifemoral bypass grafting  1997    with R fem-pop  . Cardiac catheterization  12/2009    occluded vein to OM & ramus branches, patent vein to PDA, patent LIMA to LAD (Dr. [name], [facility]) - later had thrombectomy of R fem-pop bypass ad R common femoral & profunda femoris  artery (Dr. Oneida Alar)  . Transthoracic echocardiogram  03/25/2010    EF 99991111, LV systolic function low normal with mild inferoseptal hypocontractility; LA mildly dilated; mod MR; mild TR, RV systolic pressure elevated, mild pulm HTN; AV mildly sclerotic; mild pulm valve regurg; aortic root sclerosis/calcif     Family History  Problem Relation Age of Onset  . Congestive Heart Failure Mother   . Diabetes Mother   . Stroke Father   . Cancer Maternal Aunt     Breast cancer    Social History:  reports that she quit smoking about 5 years ago. She has never used smokeless tobacco. She reports that she does not drink alcohol or use illicit drugs.  Review of Systems -    LOW BACK PAIN:  She is having much less low pain now and she feels that this was related to previous urinary tract infection  Occasionally may take hydrocodone for different aches and pains Previously has had epidural steroids which has reduced her radiculopathy  ?  Diastolic dysfunction; previously present .   She is taking Lasix regularly every other day without any symptoms, she thinks she will have more symptoms if she does not do this No recent edema   Hyperlipidemia: Has history of high LDL and triglycerides/low HDL  Was changed from Lipitor to Crestor because of higher LDL and she was not wanting to pay the higher cost of Zetia with the Lipitor  LDL is still over 70 Tends to have high triglycerides also despite taking fenofibrate No recurrence of liver dysfunction   Lab Results  Component Value Date  CHOL 158 12/27/2014   HDL 28.60* 12/27/2014   LDLCALC 49 09/13/2013   LDLDIRECT 83.0 12/27/2014   TRIG 247.0* 12/27/2014   CHOLHDL 6 12/27/2014   Lab Results  Component Value Date   ALT 11 12/27/2014    Iron Deficiency anemia:  Has had mild anemia with  persistently low hemoglobin and also iron deficiency. Takes iron  Every other day and hemoglobin is stable  Also taking B12.     Lab Results   Component Value Date   WBC 9.0 11/20/2014   HGB 11.1* 11/20/2014   HCT 34.3* 11/20/2014   MCV 86.7 11/20/2014   PLT 324.0 11/20/2014    Vitamin D deficiency: She has been on supplements and taking her weekly dose, last level was checked in 04/2013     HYPOTHYROID: Currently on 125 g with consistent levels of TSH  Lab Results  Component Value Date   TSH 1.59 11/20/2014   TSH 3.42 07/22/2014   TSH 2.07 04/24/2014   FREET4 1.33 07/22/2014   FREET4 1.29 04/24/2014   FREET4 1.12 01/23/2014       Examination:   BP 126/74 mmHg  Pulse 72  Temp(Src) 98.3 F (36.8 C)  Resp 14  Ht 5\' 7"  (1.702 m)  Wt 159 lb 6.4 oz (72.303 kg)  BMI 24.96 kg/m2  SpO2 97%  Body mass index is 24.96 kg/(m^2).    No pedal edema present  Assesment/PLAN:    1. RENAL dysfunction: Creatinine is improved spontaneously even though she was shown to have bilateral hydronephrosis on ultrasound.  She is being evaluated further by urologist.  She will continue to hold off on fenofibrate and trimethoprim  2.  History of recurrent UTI.  Currently has no symptoms  3.  DIABETES: Her control has been excellent and does not need any adjustment of her insulin, fasting readings are still reasonably good with reduced dose of Levemir 20 units  4.  Vitamin D deficiency: We'll need follow-up levels  5.  Low normal potassium.  This may be related to improved renal function and she will supplement diet  Patient Instructions  Add more potasssium to diet     [facility]  01/20/2015

## 2015-01-22 ENCOUNTER — Ambulatory Visit (HOSPITAL_COMMUNITY)
Admission: RE | Admit: 2015-01-22 | Discharge: 2015-01-22 | Disposition: A | Discharge status: Home / Self Care | Payer: Medicare Other | Source: Ambulatory Visit | Attending: Urology | Admitting: Urology

## 2015-01-22 DIAGNOSIS — N1330 Unspecified hydronephrosis: Secondary | ICD-10-CM

## 2015-01-22 MED ORDER — TECHNETIUM TC 99M MERTIATIDE
15.2000 | Freq: Once | INTRAVENOUS | Status: AC | PRN
  Administered 2015-01-22: 15 via INTRAVENOUS

## 2015-01-22 MED ORDER — FUROSEMIDE 10 MG/ML IJ SOLN
INTRAMUSCULAR | Status: AC
  Filled 2015-01-22: qty 4

## 2015-01-22 MED ORDER — FUROSEMIDE 10 MG/ML IJ SOLN: 36.1000 mg | Freq: Once | INTRAMUSCULAR | Status: DC

## 2015-02-10 ENCOUNTER — Other Ambulatory Visit: Payer: Self-pay | Admitting: Endocrinology

## 2015-02-12 ENCOUNTER — Other Ambulatory Visit: Payer: Self-pay | Admitting: Endocrinology

## 2015-02-13 DIAGNOSIS — N1330 Unspecified hydronephrosis: Secondary | ICD-10-CM | POA: Diagnosis not present

## 2015-02-17 ENCOUNTER — Telehealth: Payer: Self-pay

## 2015-02-17 NOTE — Telephone Encounter (Signed)
Requesting surgical clearance:   1. Type of surgery: Bilateral retrograde pyelogram and right stent placement  2. Surgeon: Dr. Louis Meckel  3. Surgical date: Pending Clearance  4. Medications that need to be held: Plavix; ASA for before days  5. CAD: yes     6. I will defer to: [facility] [phone] fax

## 2015-02-18 NOTE — Telephone Encounter (Signed)
Okay to hold antiplatelet therapy for her urologic procedure

## 2015-02-18 NOTE — Telephone Encounter (Signed)
Pt clearance faxed to [facility].

## 2015-02-19 ENCOUNTER — Other Ambulatory Visit: Payer: Self-pay | Admitting: Urology

## 2015-03-05 ENCOUNTER — Encounter (HOSPITAL_BASED_OUTPATIENT_CLINIC_OR_DEPARTMENT_OTHER): Payer: Self-pay | Admitting: *Deleted

## 2015-03-05 NOTE — Progress Notes (Addendum)
NPO AFTER MN.  ARRIVE AT 0800.  NEEDS ISTAT 8.  CURRENT EKG IN CHART AND EPIC.  WILL TAKE PRILOSEC, SYNTHROID, TOPROL, AND CRESTOR AM DOS W/ SIPS OF WATER. WILL BRING INHALER.  REVIEWED CHART W/ DR DENENNY MDA, OK TO PROCEED.

## 2015-03-05 NOTE — Anesthesia Preprocedure Evaluation (Addendum)
Anesthesia Evaluation  Patient identified by MRN, date of birth, ID band Patient awake    Reviewed: Allergy & Precautions, NPO status , Patient's Chart, lab work & pertinent test results, reviewed documented beta blocker date and time   History of Anesthesia Complications Negative for: history of anesthetic complications  Airway Mallampati: III  TM Distance: >3 FB Neck ROM: Full    Dental no notable dental hx. (+) Poor Dentition, Dental Advisory Given   Pulmonary former smoker,    Pulmonary exam normal breath sounds clear to auscultation       Cardiovascular hypertension, Pt. on medications and Pt. on home beta blockers + CAD, + CABG, +CHF and + DOE (This is stable and no change from her baseline)  Normal cardiovascular exam Rhythm:Regular Rate:Normal  Per cardiology: "history of CAD status post coronary artery bypass grafting x6 in 1997. She has had bilateral carotid endarterectomies, aortobifemoral bypass grafting and right fem-pop bypass grafting with lifestyle-limiting claudication. Cath performed by Dr. [name] December 09, 2009, revealed an occluded vein to an OM and ramus branches with a patent vein to a PDA and a patent LIMA to the LAD. Her EF was 40% at that time. She quit smoking November 2011. She had an echo performed March 25, 2010, that showed normal LV systolic function and a Myoview performed November 24, 2011, which was nonischemic."  She has remained on her Plavix and ASA.    Neuro/Psych negative neurological ROS  negative psych ROS   GI/Hepatic Neg liver ROS, GERD  Medicated and Controlled,  Endo/Other  diabetes, Insulin DependentHypothyroidism   Renal/GU Renal disease (CKD stage III)  negative genitourinary   Musculoskeletal  (+) Arthritis ,   Abdominal   Peds negative pediatric ROS (+)  Hematology  (+) anemia ,   Anesthesia Other Findings   Reproductive/Obstetrics negative OB ROS                        Anesthesia Physical Anesthesia Plan  ASA: IV  Anesthesia Plan: MAC   Post-op Pain Management:    Induction: Intravenous  Airway Management Planned: Simple Face Mask  Additional Equipment:   Intra-op Plan:   Post-operative Plan: Extubation in OR  Informed Consent: I have reviewed the patients History and Physical, chart, labs and discussed the procedure including the risks, benefits and alternatives for the proposed anesthesia with the patient or authorized representative who has indicated his/her understanding and acceptance.   Dental advisory given  Plan Discussed with: CRNA  Anesthesia Plan Comments:      Anesthesia Quick Evaluation

## 2015-03-07 ENCOUNTER — Encounter (HOSPITAL_BASED_OUTPATIENT_CLINIC_OR_DEPARTMENT_OTHER): Payer: Self-pay | Admitting: *Deleted

## 2015-03-07 ENCOUNTER — Ambulatory Visit (HOSPITAL_BASED_OUTPATIENT_CLINIC_OR_DEPARTMENT_OTHER): Payer: Medicare Other | Admitting: Anesthesiology

## 2015-03-07 ENCOUNTER — Encounter (HOSPITAL_BASED_OUTPATIENT_CLINIC_OR_DEPARTMENT_OTHER)
Admission: RE | Disposition: A | Discharge status: Home / Self Care | Payer: Self-pay | Source: Ambulatory Visit | Attending: Urology

## 2015-03-07 ENCOUNTER — Ambulatory Visit (HOSPITAL_BASED_OUTPATIENT_CLINIC_OR_DEPARTMENT_OTHER)
Admission: RE | Admit: 2015-03-07 | Discharge: 2015-03-07 | Disposition: A | Discharge status: Home / Self Care | Payer: Medicare Other | Source: Ambulatory Visit | Attending: Urology | Admitting: Urology

## 2015-03-07 DIAGNOSIS — Z79891 Long term (current) use of opiate analgesic: Secondary | ICD-10-CM | POA: Diagnosis not present

## 2015-03-07 DIAGNOSIS — E7800 Pure hypercholesterolemia, unspecified: Secondary | ICD-10-CM | POA: Insufficient documentation

## 2015-03-07 DIAGNOSIS — I2510 Atherosclerotic heart disease of native coronary artery without angina pectoris: Secondary | ICD-10-CM | POA: Diagnosis not present

## 2015-03-07 DIAGNOSIS — Z794 Long term (current) use of insulin: Secondary | ICD-10-CM | POA: Diagnosis not present

## 2015-03-07 DIAGNOSIS — I13 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 1 through stage 4 chronic kidney disease, or unspecified chronic kidney disease: Secondary | ICD-10-CM | POA: Diagnosis not present

## 2015-03-07 DIAGNOSIS — M1990 Unspecified osteoarthritis, unspecified site: Secondary | ICD-10-CM | POA: Diagnosis not present

## 2015-03-07 DIAGNOSIS — Z951 Presence of aortocoronary bypass graft: Secondary | ICD-10-CM | POA: Diagnosis not present

## 2015-03-07 DIAGNOSIS — Z79899 Other long term (current) drug therapy: Secondary | ICD-10-CM | POA: Insufficient documentation

## 2015-03-07 DIAGNOSIS — N183 Chronic kidney disease, stage 3 (moderate): Secondary | ICD-10-CM | POA: Insufficient documentation

## 2015-03-07 DIAGNOSIS — Z87891 Personal history of nicotine dependence: Secondary | ICD-10-CM | POA: Insufficient documentation

## 2015-03-07 DIAGNOSIS — E119 Type 2 diabetes mellitus without complications: Secondary | ICD-10-CM | POA: Diagnosis not present

## 2015-03-07 DIAGNOSIS — R0609 Other forms of dyspnea: Secondary | ICD-10-CM | POA: Insufficient documentation

## 2015-03-07 DIAGNOSIS — N131 Hydronephrosis with ureteral stricture, not elsewhere classified: Secondary | ICD-10-CM

## 2015-03-07 DIAGNOSIS — Z841 Family history of disorders of kidney and ureter: Secondary | ICD-10-CM | POA: Diagnosis not present

## 2015-03-07 DIAGNOSIS — Z7982 Long term (current) use of aspirin: Secondary | ICD-10-CM | POA: Insufficient documentation

## 2015-03-07 DIAGNOSIS — E039 Hypothyroidism, unspecified: Secondary | ICD-10-CM | POA: Diagnosis not present

## 2015-03-07 DIAGNOSIS — I509 Heart failure, unspecified: Secondary | ICD-10-CM | POA: Insufficient documentation

## 2015-03-07 DIAGNOSIS — K219 Gastro-esophageal reflux disease without esophagitis: Secondary | ICD-10-CM | POA: Diagnosis not present

## 2015-03-07 DIAGNOSIS — N1330 Unspecified hydronephrosis: Secondary | ICD-10-CM | POA: Diagnosis not present

## 2015-03-07 HISTORY — DX: Personal history of other infectious and parasitic diseases: Z86.19

## 2015-03-07 HISTORY — DX: Gastro-esophageal reflux disease without esophagitis: K21.9

## 2015-03-07 HISTORY — DX: Retroperitoneal fibrosis: K68.2

## 2015-03-07 HISTORY — DX: Crossing vessel and stricture of ureter without hydronephrosis: N13.5

## 2015-03-07 HISTORY — DX: Unspecified hydronephrosis: N13.30

## 2015-03-07 HISTORY — DX: Dyspnea, unspecified: R06.00

## 2015-03-07 HISTORY — PX: CYSTOSCOPY W/ URETERAL STENT PLACEMENT: SHX1429

## 2015-03-07 HISTORY — DX: Other forms of dyspnea: R06.09

## 2015-03-07 HISTORY — DX: Coronary atherosclerosis due to lipid rich plaque: I25.83

## 2015-03-07 HISTORY — DX: Atherosclerotic heart disease of native coronary artery without angina pectoris: I25.10

## 2015-03-07 HISTORY — DX: Chronic kidney disease, stage 3 unspecified: N18.30

## 2015-03-07 HISTORY — DX: Type 2 diabetes mellitus without complications: E11.9

## 2015-03-07 HISTORY — DX: Peripheral vascular disease, unspecified: I73.9

## 2015-03-07 HISTORY — DX: Chronic kidney disease, stage 3 (moderate): N18.3

## 2015-03-07 LAB — GLUCOSE, CAPILLARY: GLUCOSE-CAPILLARY: 177 mg/dL — AB (ref 65–99)

## 2015-03-07 LAB — POCT I-STAT, CHEM 8
BUN: 26 mg/dL — ABNORMAL HIGH (ref 6–20)
CREATININE: 1.5 mg/dL — AB (ref 0.44–1.00)
Calcium, Ion: 1.27 mmol/L (ref 1.13–1.30)
Chloride: 101 mmol/L (ref 101–111)
GLUCOSE: 186 mg/dL — AB (ref 65–99)
HCT: 37 % (ref 36.0–46.0)
HEMOGLOBIN: 12.6 g/dL (ref 12.0–15.0)
POTASSIUM: 3.3 mmol/L — AB (ref 3.5–5.1)
Sodium: 143 mmol/L (ref 135–145)
TCO2: 27 mmol/L (ref 0–100)

## 2015-03-07 SURGERY — CYSTOSCOPY, WITH RETROGRADE PYELOGRAM AND URETERAL STENT INSERTION
Anesthesia: General | Laterality: Bilateral

## 2015-03-07 MED ORDER — BELLADONNA ALKALOIDS-OPIUM 16.2-60 MG RE SUPP
RECTAL | Status: AC
  Filled 2015-03-07: qty 1

## 2015-03-07 MED ORDER — HYDROCODONE-ACETAMINOPHEN 5-325 MG PO TABS
ORAL_TABLET | ORAL | Status: AC
  Filled 2015-03-07: qty 1

## 2015-03-07 MED ORDER — ONDANSETRON HCL 4 MG/2ML IJ SOLN
INTRAMUSCULAR | Status: AC
  Filled 2015-03-07: qty 2

## 2015-03-07 MED ORDER — ONDANSETRON HCL 4 MG/2ML IJ SOLN
4.0000 mg | Freq: Once | INTRAMUSCULAR | Status: DC | PRN
  Filled 2015-03-07: qty 2

## 2015-03-07 MED ORDER — SODIUM CHLORIDE 0.9 % IV SOLN
INTRAVENOUS | Status: DC
  Administered 2015-03-07: 09:00:00 via INTRAVENOUS
  Filled 2015-03-07: qty 1000

## 2015-03-07 MED ORDER — PROPOFOL 500 MG/50ML IV EMUL
INTRAVENOUS | Status: AC
  Filled 2015-03-07: qty 50

## 2015-03-07 MED ORDER — CIPROFLOXACIN IN D5W 400 MG/200ML IV SOLN
INTRAVENOUS | Status: AC
  Filled 2015-03-07: qty 200

## 2015-03-07 MED ORDER — CIPROFLOXACIN IN D5W 400 MG/200ML IV SOLN
400.0000 mg | INTRAVENOUS | Status: AC
  Administered 2015-03-07: 400 mg via INTRAVENOUS
  Filled 2015-03-07: qty 200

## 2015-03-07 MED ORDER — ONDANSETRON HCL 4 MG/2ML IJ SOLN
INTRAMUSCULAR | Status: DC | PRN
  Administered 2015-03-07: 4 mg via INTRAVENOUS

## 2015-03-07 MED ORDER — FENTANYL CITRATE (PF) 100 MCG/2ML IJ SOLN
INTRAMUSCULAR | Status: AC
  Filled 2015-03-07: qty 2

## 2015-03-07 MED ORDER — FENTANYL CITRATE (PF) 100 MCG/2ML IJ SOLN
INTRAMUSCULAR | Status: DC | PRN
  Administered 2015-03-07 (×4): 12.5 ug via INTRAVENOUS

## 2015-03-07 MED ORDER — LIDOCAINE HCL (CARDIAC) 20 MG/ML IV SOLN
INTRAVENOUS | Status: AC
  Filled 2015-03-07: qty 5

## 2015-03-07 MED ORDER — BELLADONNA ALKALOIDS-OPIUM 16.2-60 MG RE SUPP
RECTAL | Status: DC | PRN
  Administered 2015-03-07: 1 via RECTAL

## 2015-03-07 MED ORDER — PROPOFOL 10 MG/ML IV BOLUS
INTRAVENOUS | Status: DC | PRN
  Administered 2015-03-07 (×2): 10 mg via INTRAVENOUS

## 2015-03-07 MED ORDER — PROPOFOL 10 MG/ML IV BOLUS
INTRAVENOUS | Status: AC
  Filled 2015-03-07: qty 20

## 2015-03-07 MED ORDER — HYDROCODONE-ACETAMINOPHEN 5-325 MG PO TABS
1.0000 | ORAL_TABLET | Freq: Four times a day (QID) | ORAL | Status: DC | PRN
  Administered 2015-03-07: 1 via ORAL
  Filled 2015-03-07: qty 1

## 2015-03-07 MED ORDER — PROPOFOL 500 MG/50ML IV EMUL
INTRAVENOUS | Status: DC | PRN
  Administered 2015-03-07: 25 ug/kg/min via INTRAVENOUS

## 2015-03-07 MED ORDER — IOHEXOL 350 MG/ML SOLN
INTRAVENOUS | Status: DC | PRN
  Administered 2015-03-07: 30 mL via URETHRAL

## 2015-03-07 MED ORDER — FENTANYL CITRATE (PF) 100 MCG/2ML IJ SOLN
25.0000 ug | INTRAMUSCULAR | Status: DC | PRN
  Administered 2015-03-07: 25 ug via INTRAVENOUS
  Filled 2015-03-07: qty 1

## 2015-03-07 SURGICAL SUPPLY — 35 items
ADAPTER CATH URET PLST 4-6FR (CATHETERS) IMPLANT
BAG DRAIN URO-CYSTO SKYTR STRL (DRAIN) ×3 IMPLANT
BASKET LASER NITINOL 1.9FR (BASKET) IMPLANT
BASKET STNLS GEMINI 4WIRE 3FR (BASKET) IMPLANT
BASKET ZERO TIP NITINOL 2.4FR (BASKET) IMPLANT
CANISTER SUCT LVC 12 LTR MEDI- (MISCELLANEOUS) ×3 IMPLANT
CATH INTERMIT  6FR 70CM (CATHETERS) IMPLANT
CATH URET 5FR 28IN CONE TIP (BALLOONS)
CATH URET 5FR 28IN OPEN ENDED (CATHETERS) ×3 IMPLANT
CATH URET 5FR 70CM CONE TIP (BALLOONS) IMPLANT
CATH URET DUAL LUMEN 6-10FR 50 (CATHETERS) IMPLANT
CLOTH BEACON ORANGE TIMEOUT ST (SAFETY) ×3 IMPLANT
DRSG TEGADERM 2-3/8X2-3/4 SM (GAUZE/BANDAGES/DRESSINGS) IMPLANT
FIBER LASER FLEXIVA 365 (UROLOGICAL SUPPLIES) IMPLANT
FIBER LASER TRAC TIP (UROLOGICAL SUPPLIES) IMPLANT
GLOVE BIO SURGEON STRL SZ7.5 (GLOVE) ×3 IMPLANT
GOWN STRL REUS W/ TWL XL LVL3 (GOWN DISPOSABLE) ×1 IMPLANT
GOWN STRL REUS W/TWL XL LVL3 (GOWN DISPOSABLE) ×2
GUIDEWIRE 0.038 PTFE COATED (WIRE) IMPLANT
GUIDEWIRE ANG ZIPWIRE 038X150 (WIRE) IMPLANT
GUIDEWIRE STR DUAL SENSOR (WIRE) ×3 IMPLANT
IV NS IRRIG 3000ML ARTHROMATIC (IV SOLUTION) ×6 IMPLANT
KIT BALLIN UROMAX 15FX10 (LABEL) IMPLANT
KIT BALLN UROMAX 15FX4 (MISCELLANEOUS) IMPLANT
KIT BALLN UROMAX 26 75X4 (MISCELLANEOUS)
KIT ROOM TURNOVER WOR (KITS) ×3 IMPLANT
MANIFOLD NEPTUNE II (INSTRUMENTS) IMPLANT
NS IRRIG 500ML POUR BTL (IV SOLUTION) IMPLANT
PACK CYSTO (CUSTOM PROCEDURE TRAY) ×3 IMPLANT
SET HIGH PRES BAL DIL (LABEL)
SHEATH ACCESS URETERAL 38CM (SHEATH) IMPLANT
STENT POLARIS LOOP 6FR X 26 CM (STENTS) ×3 IMPLANT
TUBE CONNECTING 12'X1/4 (SUCTIONS)
TUBE CONNECTING 12X1/4 (SUCTIONS) IMPLANT
TUBE FEEDING 8FR 16IN STR KANG (MISCELLANEOUS) IMPLANT

## 2015-03-07 NOTE — Anesthesia Procedure Notes (Signed)
Procedure Name: MAC Date/Time: 03/07/2015 10:19 AM Performed by: Justice Rocher Pre-anesthesia Checklist: Patient identified, Timeout performed, Emergency Drugs available, Suction available and Patient being monitored Oxygen Delivery Method: Simple face mask Preoxygenation: Pre-oxygenation with 100% oxygen Intubation Type: IV induction Placement Confirmation: positive ETCO2 and breath sounds checked- equal and bilateral

## 2015-03-07 NOTE — Discharge Instructions (Signed)
DISCHARGE INSTRUCTIONS FOR KIDNEY STONE/URETERAL STENT   MEDICATIONS:  1.  Resume all your other meds from home.    ACTIVITY:  1. No strenuous activity x 1week  2. No driving while on narcotic pain medications  3. Drink plenty of water  4. Continue to walk at home - you can still get blood clots when you are at home, so keep active, but don't over do it.  5. May return to work/school tomorrow or when you feel ready   BATHING:  1. You can shower and we recommend daily showers    SIGNS/SYMPTOMS TO CALL:  Please call us if you have a fever greater than 101.5, uncontrolled nausea/vomiting, uncontrolled pain, dizziness, unable to urinate, bloody urine, chest pain, shortness of breath, leg swelling, leg pain, redness around wound, drainage from wound, or any other concerns or questions.   You can reach [place] at [phone].   FOLLOW-UP:  1. You have an appointment in 6 weeks with a ultrasound of your kidneys prior.       Post Anesthesia Home Care Instructions  Activity: Get plenty of rest for the remainder of the day. A responsible adult should stay with you for 24 hours following the procedure.  For the next 24 hours, DO NOT: -Drive a car -Paediatric nurse -Drink alcoholic beverages -Take any medication unless instructed by your physician -Make any legal decisions or sign important papers.  Meals: Start with liquid foods such as gelatin or soup. Progress to regular foods as tolerated. Avoid greasy, spicy, heavy foods. If nausea and/or vomiting occur, drink only clear liquids until the nausea and/or vomiting subsides. Call your physician if vomiting continues.  Special Instructions/Symptoms: Your throat may feel dry or sore from the anesthesia or the breathing tube placed in your throat during surgery. If this causes discomfort, gargle with warm salt water. The discomfort should disappear within 24 hours.  If you had a scopolamine patch placed behind your ear for the management  of post- operative nausea and/or vomiting:  1. The medication in the patch is effective for 72 hours, after which it should be removed.  Wrap patch in a tissue and discard in the trash. Wash hands thoroughly with soap and water. 2. You may remove the patch earlier than 72 hours if you experience unpleasant side effects which may include dry mouth, dizziness or visual disturbances. 3. Avoid touching the patch. Wash your hands with soap and water after contact with the patch.

## 2015-03-07 NOTE — Transfer of Care (Signed)
Immediate Anesthesia Transfer of Care Note  Patient: [name] [name]  Procedure(s) Performed: Procedure(s) (LRB): BILATERAL RETROGRADE PYELOGRAM AND RIGHT URETERAL STENT PLACEMENT (Bilateral)  Patient Location: PACU  Anesthesia Type: MAC  Level of Consciousness: awake, sedated, patient cooperative and responds to stimulation  Airway & Oxygen Therapy: Patient Spontanous Breathing and Patient on RA - alert   Post-op Assessment: Report given to PACU RN, Post -op Vital signs reviewed and stable and Patient moving all extremities  Post vital signs: Reviewed and stable  Complications: No apparent anesthesia complications

## 2015-03-07 NOTE — Op Note (Signed)
Preoperative diagnosis:  1. Right hydronephrosis   Postoperative diagnosis:  1. Same   Procedure: 1. Cystoscopy, bilateral retrograde pyelogram with interpretation, right ureteral stent placement  Surgeon: Ardis Hughs, MD  Anesthesia: Mac  Complications: None  Intraoperative findings: 10 mL of Omnipaque contrast was instilled into the patient's left ureter under fluoroscopic guidance. The ureter was normal distally to the pelvic brim where it stopped abruptly. With a moderate amount of pressure on the syringe I was unable to get any contrast beyond this area. This significant for complete ureteral obstruction on the patient's left-hand side. The right retrograde pyelogram was performed in a similar fashion, with a normal caliber distal ureter to the pelvic brim where there was a significant narrowing with proximal ureteral dilatation and severe tortuosity leading to a distended right renal pelvis. There were no filling defects per se.  EBL: Minimal  Specimens: None  Indication: [name] [name] is a 74 y.o. patient with history of retroperitoneal fibrosis with associated ureteral stricture.  She presented to her primary care doctor and was noted to have elevated creatinine and it newly diagnosed right hydronephrosis. A Lasix renogram demonstrated complete obstruction on both sides.  After reviewing the management options for treatment, he elected to proceed with the above surgical procedure(s). We have discussed the potential benefits and risks of the procedure, side effects of the proposed treatment, the likelihood of the patient achieving the goals of the procedure, and any potential problems that might occur during the procedure or recuperation. Informed consent has been obtained.  Description of procedure:  The patient was taken to the operating room and anesthesia was induced.  The patient was placed in the dorsal lithotomy position, prepped and draped in the usual sterile  fashion, and preoperative antibiotics were administered. A preoperative time-out was performed.   I gently passed a 21 French 30 cystoscope through the patient's urethra and into the bladder. 360 cystoscopic evaluation was performed with no significant abnormalities of the mucosa noted. There were orthotopic urethral meatus is. The bladder was slightly prolapsed and there was some squamous metaplasia at the trigone. Otherwise unremarkable. A 5 French open-ended ureteral catheter was then inserted into the patient's left ureteral orifice and retrograde pyelogram performed with the above findings. The catheter was then removed and placed into the right ureteral orifice. A similar retrograde pyelogram was performed with the above findings. I then advanced a 0.38 sensor wire through the ureteral catheter and was able to navigate the ureter and ultimately get the wire into the right renal pelvis. I advanced the open-ended catheter over the wire and was some moderate pressure was able to get through the stricture. Once I was able to get the catheter up into the renal pelvis and gentle tugging on the catheter allowed the ureter to straighten. This point I removed the open-ended ureteral catheter and placed a 26 cm 6 French Polaris stent in the routine fashion under fluoroscopic guidance. A nice curl was noted in the patient's renal pelvis. The bladder was subsequently emptied and a B and O suppository placed in the patient's rectum. The patient was subsequently awoken and returned to the PACU in excellent condition.    Ardis Hughs, M.D.

## 2015-03-07 NOTE — H&P (Signed)
Reason For Visit f/u for worsening renal function   History of Present Illness 74 year old patient who is seen today at the request of Dr. [name] who recently saw the patient for an acute rise in her creatinine. He subsequent obtained a renal ultrasound which demonstrated bilateral hydronephrosis. The patient has a history of retroperitoneal fibrosis and left sided hydronephrosis. She had a stent placed in the spring of 2015 followed by renogram demonstrating 18% function on the left kidney. Her stent was subsequently removed. Currently, the patient denies any ongoing back pain or voiding symptoms. She feels that she is doing quite well, denies any progressive voiding symptoms or gross hematuria. She denies any peripheral edema or shortness of breath.  Intv: U/S confirmed bilateral hydronephrosis, renogram demonstrated bilateral obstruction with 85% right function and 15% left sided function, this is a significant change from her previous ultraounds and renograms. in Jan 2014 she had no hydro or evidence of right sided obstruction. The patient denies any ongoing flank pain. She denies any gross hematuria. She denies any voiding symptoms.   Past Medical History Problems  1. History of Arthritis 2. History of cardiac disorder (Z86.79) 3. History of diabetes mellitus (Z86.39) 4. History of hypercholesterolemia (Z86.39) 5. History of hypertension (Z86.79) 6. History of hypothyroidism (Z86.39)  Surgical History Problems  1. History of Cesarean Section 2. History of Coronary Artery Single Venous Bypass Graft 3. History of Cystoscopy With Insertion Of Ureteral Stent 4. History of Cystoscopy With Insertion Of Ureteral Stent Left 5. History of Hysterectomy  Current Meds 1. Albuterol Sulfate (2.5 MG/3ML) 0.083% Inhalation Nebulization Solution;  Therapy: (Recorded:15Oct2015) to Recorded 2. Aspirin 81 MG TABS;  Therapy: (Recorded:21Jan2014) to Recorded 3. Calcium/Vitamin D/Minerals TABS;  Therapy: (Recorded:21Jan2014) to Recorded 4. Clopidogrel Bisulfate 75 MG Oral Tablet;  Therapy: [phone] to Recorded 5. Crestor 20 MG Oral Tablet;  Therapy: 15Sep2013 to Recorded 6. Fenofibrate 67 MG CAPS;  Therapy: (Recorded:12Apr2016) to Recorded 7. Ferrous Sulfate 325 (65 Fe) MG Oral Tablet Delayed Release;  Therapy: (Recorded:21Jan2014) to Recorded 8. Furosemide 20 MG Oral Tablet;  Therapy: [id number] to Recorded 9. Hydrocodone-Acetaminophen 5-500 MG Oral Tablet;  Therapy: [id number] to Recorded 10. Levemir FlexPen 100 UNIT/ML SOLN;   Therapy: [id number] to Recorded 11. Magnesium Oxide 400 MG Oral Tablet;   Therapy: (Recorded:21Jan2014) to Recorded 12. Metoprolol Succinate ER 25 MG Oral Tablet Extended Release 24 Hour;   Therapy: [id number] to Recorded 13. NovoLOG FlexPen 100 UNIT/ML SOLN;   Therapy: 30Aug2013 to Recorded 14. Omeprazole 20 MG Oral Capsule Delayed Release;   Therapy: [id number] to Recorded 15. Synthroid 137 MCG Oral Tablet;   Therapy: 26Aug2013 to Recorded 16. Vitamin B-12 TABS;   Therapy: (Recorded:21Jan2014) to Recorded 17. Vitamin C TABS;   Therapy: (Recorded:21Jan2014) to Recorded 18. Vitamin D TABS;   Therapy: (Recorded:21Jan2014) to Recorded  Allergies Medication  1. Victoza SOLN  Family History Problems  1. Family history of Diabetes Mellitus : Mother 2. Family history of Family Health Status Number Of Children : Father   1 daughter 3. Family history of Father Deceased At Age 66 : Father   stroke 4. Family history of Hypertension 5. Family history of Mother Deceased At Age 67 : Father   heart failure 6. Family history of Nephrolithiasis 7. Family history of Transient Ischemic Attack : Mother  Social History Problems  1. Denied: History of Alcohol Use (History) 2. Caffeine Use   16 oz 3. Former smoker [phone]) 4. Marital History - Single 5. Retired From Work 53. Tobacco use (Z72.0)  2 pks a day for 30 yrs /quit 2 yrs ago  Review  of Systems No changes in pts bowel habits, neurological changes, or progressive lower urinary tract symptoms.      Vitals Vital Signs [Data Includes: Last 1 Day]  Recorded: [id number] 01:49PM  Blood Pressure: 162 / 70 Temperature: 97.9 F Heart Rate: 80  Physical Exam Constitutional: Well nourished and well developed . No acute distress.  ENT:. The ears and nose are normal in appearance.  Pulmonary: No respiratory distress and normal respiratory rhythm and effort.  Cardiovascular: Heart rate and rhythm are normal . No peripheral edema.  Abdomen: The abdomen is soft and nontender. No masses are palpated. No CVA tenderness. No hernias are palpable. No hepatosplenomegaly noted.  Skin: Normal skin turgor, no visible rash and no visible skin lesions.    Results/Data Renal ultrasound: This is performed today to evaluate the patient's hydronephrosis. It is compared to a renal ultrasound performed on 01/09/15. The right kidney measures 9.92 cm in length with a cortical thickness of 1.2 cm. The proximal ureter measures 1.4 cm with severe hydronephrosis. These findings are similar to her ultrasound 6 weeks prior. The left kidney measures 11.7 cm in length with a cortical thickness of 0.69 cm in the proximal ureter dilation of 1.96 cm. These are also similar measurements to 6 weeks prior. There are no renal masses or stones noted within the parenchyma bilaterally.  I reviewed the patient's Lasix renogram as well with the findings as dictated above  Urinalysis: The urine appears to be a contaminant although I did send a urine culture to be sure.     Assessment Assessed  1. Bilateral hydronephrosis (N13.30)  Plan Bilateral hydronephrosis  1. Follow-up Schedule Surgery Office  Follow-up  Status: Complete  Done: [id number] 2. BASIC METABOLIC PANEL; Status:In Progress - Specimen/Data Collected;   Done:  [id number] 3. URINE CULTURE; Status:In Progress - Specimen/Data Collected;   Done: [id number] 4.  VENIPUNCTURE; Status:Complete;   [id number]  Discussion/Summary The patient has bilateral hydronephrosis with declining renal function. She's had left hydronephrosis for several years. The right side is new. Further, the right side is obstructed based on the Lasix renogram. I suspect this is the same process, likely retroperitoneal fibrosis from her vascular surgeries. I went over these findings with the patient in quite detail. I suggested that we perform cystoscopy, bilateral retrograde pyelograms, and a right-sided double-J ureteral stents. We would initially planned to place a soft stent and then with the first of the serial exchanges in 6 months consider a metallic stent or a longer lasting stent to prevent her from returning to the operating room every 6 months. We discussed the alternatives of this which would be a nephrostomy tube. We also discussed the option of doing nothing, I explained this would ultimately lead to hasten kidney failure and either dialysis or death. The patient has opted to proceed with stent placement. I went over this procedure with her quite detail. Given her comorbidities, we could perform this procedure while on Plavix. However, I would like her to get cardiac clearance from Dr. Gwenlyn Found.

## 2015-03-07 NOTE — Anesthesia Postprocedure Evaluation (Signed)
Anesthesia Post Note  Patient: [name] [name] Begin  Procedure(s) Performed: Procedure(s) (LRB): BILATERAL RETROGRADE PYELOGRAM AND RIGHT URETERAL STENT PLACEMENT (Bilateral)  Patient location during evaluation: PACU Anesthesia Type: MAC Level of consciousness: awake and alert Pain management: pain level controlled Vital Signs Assessment: post-procedure vital signs reviewed and stable Respiratory status: spontaneous breathing, nonlabored ventilation, respiratory function stable and patient connected to nasal cannula oxygen Cardiovascular status: blood pressure returned to baseline and stable Postop Assessment: no signs of nausea or vomiting Anesthetic complications: no    Last Vitals:  Filed Vitals:   03/07/15 1120 03/07/15 1130  BP:  152/52  Pulse: 59 58  Temp:    Resp: 18 22    Last Pain:  Filed Vitals:   03/07/15 1132  PainSc: 2                  Boby Eyer JENNETTE

## 2015-03-09 DIAGNOSIS — K8050 Calculus of bile duct without cholangitis or cholecystitis without obstruction: Secondary | ICD-10-CM

## 2015-03-09 HISTORY — DX: Calculus of bile duct without cholangitis or cholecystitis without obstruction: K80.50

## 2015-03-11 ENCOUNTER — Encounter (HOSPITAL_BASED_OUTPATIENT_CLINIC_OR_DEPARTMENT_OTHER): Payer: Self-pay | Admitting: Urology

## 2015-03-12 ENCOUNTER — Other Ambulatory Visit: Payer: Self-pay | Admitting: Endocrinology

## 2015-03-20 ENCOUNTER — Other Ambulatory Visit: Payer: Medicare Other

## 2015-03-24 ENCOUNTER — Ambulatory Visit: Payer: Medicare Other | Admitting: Endocrinology

## 2015-03-27 DIAGNOSIS — N1330 Unspecified hydronephrosis: Secondary | ICD-10-CM | POA: Diagnosis not present

## 2015-03-27 DIAGNOSIS — N390 Urinary tract infection, site not specified: Secondary | ICD-10-CM | POA: Diagnosis not present

## 2015-03-27 DIAGNOSIS — Z0000 Encounter for general adult medical examination without abnormal findings: Secondary | ICD-10-CM | POA: Diagnosis not present

## 2015-03-28 ENCOUNTER — Other Ambulatory Visit: Payer: Self-pay | Admitting: *Deleted

## 2015-03-28 ENCOUNTER — Telehealth: Payer: Self-pay | Admitting: Endocrinology

## 2015-03-28 MED ORDER — HYDROCODONE-ACETAMINOPHEN 5-325 MG PO TABS: ORAL_TABLET | ORAL | Status: DC

## 2015-03-28 NOTE — Telephone Encounter (Signed)
Pt needs refill on hydrocodone she will pick up the script on Monday AM 120 pills 30 day supply

## 2015-03-28 NOTE — Telephone Encounter (Signed)
rx is ready for pick up

## 2015-03-31 ENCOUNTER — Other Ambulatory Visit (INDEPENDENT_AMBULATORY_CARE_PROVIDER_SITE_OTHER): Payer: Medicare Other

## 2015-03-31 DIAGNOSIS — N183 Chronic kidney disease, stage 3 unspecified: Secondary | ICD-10-CM

## 2015-03-31 DIAGNOSIS — E063 Autoimmune thyroiditis: Secondary | ICD-10-CM

## 2015-03-31 DIAGNOSIS — E038 Other specified hypothyroidism: Secondary | ICD-10-CM | POA: Diagnosis not present

## 2015-03-31 DIAGNOSIS — E559 Vitamin D deficiency, unspecified: Secondary | ICD-10-CM | POA: Diagnosis not present

## 2015-03-31 LAB — COMPREHENSIVE METABOLIC PANEL
ALBUMIN: 3.4 g/dL — AB (ref 3.5–5.2)
ALK PHOS: 74 U/L (ref 39–117)
ALT: 20 U/L (ref 0–35)
AST: 23 U/L (ref 0–37)
BILIRUBIN TOTAL: 0.2 mg/dL (ref 0.2–1.2)
BUN: 29 mg/dL — AB (ref 6–23)
CO2: 23 mEq/L (ref 19–32)
CREATININE: 1.41 mg/dL — AB (ref 0.40–1.20)
Calcium: 9.2 mg/dL (ref 8.4–10.5)
Chloride: 107 mEq/L (ref 96–112)
GFR: 38.75 mL/min — ABNORMAL LOW (ref >60.00)
GLUCOSE: 94 mg/dL (ref 70–99)
POTASSIUM: 4.9 meq/L (ref 3.5–5.1)
SODIUM: 139 meq/L (ref 135–145)
TOTAL PROTEIN: 6.7 g/dL (ref 6.0–8.3)

## 2015-03-31 LAB — VITAMIN D 25 HYDROXY (VIT D DEFICIENCY, FRACTURES): VITD: 61.34 ng/mL (ref 30.00–100.00)

## 2015-03-31 LAB — TSH: TSH: 0.21 u[IU]/mL — AB (ref 0.35–4.50)

## 2015-04-04 ENCOUNTER — Ambulatory Visit (INDEPENDENT_AMBULATORY_CARE_PROVIDER_SITE_OTHER): Payer: Medicare Other | Admitting: Endocrinology

## 2015-04-04 ENCOUNTER — Encounter: Payer: Self-pay | Admitting: Endocrinology

## 2015-04-04 VITALS — BP 118/58 | HR 65 | Temp 97.8°F | Resp 16 | Ht 67.0 in | Wt 152.4 lb

## 2015-04-04 DIAGNOSIS — E038 Other specified hypothyroidism: Secondary | ICD-10-CM | POA: Diagnosis not present

## 2015-04-04 DIAGNOSIS — E119 Type 2 diabetes mellitus without complications: Secondary | ICD-10-CM

## 2015-04-04 DIAGNOSIS — E063 Autoimmune thyroiditis: Secondary | ICD-10-CM

## 2015-04-04 NOTE — Patient Instructions (Addendum)
Take 1/2 Synthroid on Saturdays and 1 on other days  Check blood sugars on waking up 3 times a week Also check blood sugars about 2 hours after a meal and do this after different meals by rotation  Recommended blood sugar levels on waking up is 90-130 and about 2 hours after meal is 130-160  Please bring your blood sugar monitor to each visit, thank you  Reduce Novolog to 6 until eating well  Take Lasix as needed  Stop magnesium when out

## 2015-04-04 NOTE — Progress Notes (Signed)
Patient ID: [name] [name], female   DOB: 1940/05/01, 75 y.o.   MRN: [id number]   Reason for Appointment:  Multiple problems, follow-up  History of Present Illness   RENAL dysfunction: Her creatinine was unusually high at 2.9 in 10/16 Her renal ultrasound revealed bilateral hydronephrosis and she did get a stent in her ureter Renal function is nearly normal now gain  No recent episode of UTI   Lab Results  Component Value Date   CREATININE 1.41* 03/31/2015   CREATININE 1.50* 03/07/2015   CREATININE 1.76* 01/17/2015   CREATININE 2.94* 12/27/2014   Lab Results  Component Value Date   CREATININE 1.41* 03/31/2015   BUN 29* 03/31/2015   NA 139 03/31/2015   K 4.9 03/31/2015   CL 107 03/31/2015   CO2 23 03/31/2015     URINARY tract infection:  She previously had pyelonephritis and urine culture done showed Escherichia coli sensitive to all organisms She is now not on trimethoprim prophylaxis, no recurrence    Type 2 diabetes mellitus, date of diagnosis: 1986.   The insulin regimen is: Levemir 20 units hs, Novolog 8 ac twice a day   Type 2 diabetes  has been treated in the last few years with low dose basal bolus insulin regimen. She has not been taking  any oral hypoglycemic drugs including metformin partly because of GI side effects  A1c in 10/16 was significantly better Taking insulin fairly consistently including before meals In the last few days her blood sugars are Low normal, she thinks she is eating a little less than usual Mostly eating 2 meals a day  Side effects from medications: Diarrhea from metformin and nausea and vomiting from GLP-1 drugs   Monitors blood glucose: Less than 1 times a day  Glucometer: One Touch.  Blood Glucose readings:   Mean values apply above for all meters except median for One Touch  PRE-MEAL Fasting Lunch Dinner Bedtime Overall  Glucose range: 84-175      Mean/median: 118    120   POST-MEAL PC Breakfast PC Lunch  PC Dinner  Glucose range:  84-256 87-158  Mean/median:  133     Hypoglycemia: None recently.   Meals: she is usually eating low fat meals but is sometimes eating out at lunch; meals usually at 11 AM,and supper at 5 pm.  Calorie intake: Usually controlled. Moderate Carbs Physical activity: exercise: Unable to do any and also not motivated  Dietician visit: Most recent:, 5/13.   Wt Readings from Last 3 Encounters:  04/04/15 152 lb 6.4 oz (69.128 kg)  03/07/15 155 lb (70.308 kg)  01/20/15 159 lb 6.4 oz (72.303 kg)    Lab Results  Component Value Date   HGBA1C 5.6 12/27/2014   HGBA1C 6.6 10/25/2014   HGBA1C 6.4 07/22/2014   Lab Results  Component Value Date   MICROALBUR 6.8* 12/27/2014   LDLCALC 49 09/13/2013   CREATININE 1.41* 03/31/2015     3.  HYPOTHYROIDISM: She has had long-standing primary hypothyroidism TSH has been fairly consistent 12/26/2014 but now her level is slightly lower, possibly because of her weight loss  She does not complain of any fatigue in the last few days, no palpitations for vitamin  Lab Results  Component Value Date   TSH 0.21* 03/31/2015   TSH 1.59 11/20/2014   TSH 3.42 07/22/2014   FREET4 1.33 07/22/2014   FREET4 1.29 04/24/2014   FREET4 1.12 01/23/2014          Medication List  This list is accurate as of: 04/04/15  1:27 PM.  Always use your most recent med list.               albuterol 108 (90 Base) MCG/ACT inhaler  Commonly known as:  PROVENTIL HFA;VENTOLIN HFA  Inhale 2 puffs into the lungs every 6 (six) hours as needed for wheezing.     aspirin 81 MG chewable tablet  Chew 81 mg by mouth at bedtime.     calcium-vitamin D 500-200 MG-UNIT tablet  Commonly known as:  OSCAL WITH D  Take 1 tablet by mouth every morning.     clopidogrel 75 MG tablet  Commonly known as:  PLAVIX  Take 1 tablet (75 mg total) by mouth every morning.     Cranberry 250 MG Tabs  Take 1 tablet by mouth daily.     CRESTOR 20 MG tablet    Generic drug:  rosuvastatin  take 1 tablet by mouth once daily     ferrous sulfate 325 (65 FE) MG tablet  Take 325 mg by mouth 3 (three) times a week.     furosemide 20 MG tablet  Commonly known as:  LASIX  take 1 tablet by mouth once daily     HYDROcodone-acetaminophen 5-325 MG tablet  Commonly known as:  NORCO/VICODIN  Take 1 tablet every 6 hours as needed for back pain     insulin aspart 100 UNIT/ML FlexPen  Commonly known as:  NOVOLOG  Inject 8 units three times a day with meals     Insulin Detemir 100 UNIT/ML Pen  Commonly known as:  LEVEMIR FLEXTOUCH  Inject 22 units at bedtime.     lactobacillus acidophilus Tabs tablet  Take 2 tablets by mouth 3 (three) times daily.     latanoprost 0.005 % ophthalmic solution  Commonly known as:  XALATAN  Place 1 drop into both eyes at bedtime.     levothyroxine 125 MCG tablet  Commonly known as:  SYNTHROID, LEVOTHROID  TAKE 1 TABLET DAILY     metoprolol succinate 25 MG 24 hr tablet  Commonly known as:  TOPROL-XL  TAKE 1/2 TABLET (=12.5 MG) EVERY MORNING     omeprazole 20 MG capsule  Commonly known as:  PRILOSEC  TAKE 1 CAPSULE DAILY     vitamin C 500 MG tablet  Commonly known as:  ASCORBIC ACID  Take 500 mg by mouth every morning.     Vitamin D (Ergocalciferol) 50000 units Caps capsule  Commonly known as:  DRISDOL  take 1 capsule by mouth every 7 DAYS TAKE ON THURSDAY        Allergies: No Known Allergies  Past Medical History  Diagnosis Date  . CHF (congestive heart failure) ([place])   . Hypertension   . Hyperlipidemia   . Hypothyroidism   . Ischemic cardiomyopathy     03-25-2010-- per lasts echo EF  50-55%  . Coronary artery disease due to lipid rich plaque cardiologist-  dr berry    s/p CABG x6 1997-- cath 12-09-2009 occluded vein to obtuse marginal branch and ramus branch with patent vien to PDA and patent LIMA to LAD, ef 40%-- Myoview 11-24-2011, nonischemic  . Type 2 diabetes mellitus ([place])     monitored by  dr Dwyane Dee  . Retroperitoneal fibrosis   . Bilateral hydronephrosis   . CKD (chronic kidney disease), stage III   . History of sepsis     10-18-2014 w/ acute pyelonephritis  . Dyspnea on exertion   . PVD (peripheral  vascular disease) with claudication ([place])   . Carotid artery stenosis     carotid doppler 06/2012 - Right CCA/Bulb/ICA with chronic occlusion; L vertebral artery with abnormal blood flow; L Bulb/Prox ICA  s/p endarterectomy with mild fibrous plaque, 50% diameter reduction  . PAD (peripheral artery disease) ([place])     09/2010 LEAs - R ABI of 0.45, occluded fem-pop bypass graft, L ABI of 0.59 with occluded SFA; severe arterial insuff  . GERD (gastroesophageal reflux disease)     Past Surgical History  Procedure Laterality Date  . Cystoscopy w/ ureteral stent placement  03/10/2012    Procedure: CYSTOSCOPY WITH RETROGRADE PYELOGRAM/URETERAL STENT PLACEMENT;  Surgeon: Hanley Ben, MD;  Location: [facility];  Service: Urology;  Laterality: Left;  . Carotid endarterectomy Bilateral right 1994//  left ?  Marland Kitchen Transthoracic echocardiogram  03/25/2010    EF 99991111, LV systolic function low normal with mild inferoseptal hypocontractility; LA mildly dilated; mod MR; mild TR, RV systolic pressure elevated, mild pulm HTN; AV mildly sclerotic; mild pulm valve regurg; aortic root sclerosis/calcif   . Coronary artery bypass graft  1997    x6; internal mammary to LAD, SVG to ramus #1 & #2, SVG to OM, SVG to PDA,   . Cardiac catheterization  12-09-2009  dr al little    EF >40%-- occluded vein to OM & ramus branches, patent vein to PDA, patent LIMA to LAD (Dr. [name], [facility]) - later had thrombectomy of R fem-pop bypass ad R common femoral & profunda femoris artery (Dr. Oneida Alar)  . Repair right femoral pseudoaneuysm/  right fem-pop bypass graft/  debridement right lower extremitiy venous status ulcers x2  01-12-2005  . Aorta - bilateral femoral artery bypass graft  1997    and RIGHT FEM-POP   . Cardiovascular  stress test  11-24-2011   dr berry    Low Risk study: fixed basal to mid inferior attenuation artifact, no reversible ischemia,  normal LV function and wall motion , ef 67%  . Total abdominal hysterectomy w/ bilateral salpingoophorectomy  1986  . Cataract extraction w/ intraocular lens  implant, bilateral  2006  . Cystoscopy w/ ureteral stent placement Bilateral 03/07/2015    Procedure: BILATERAL RETROGRADE PYELOGRAM AND RIGHT URETERAL STENT PLACEMENT;  Surgeon: Ardis Hughs, MD;  Location: [facility];  Service: Urology;  Laterality: Bilateral;    Family History  Problem Relation Age of Onset  . Congestive Heart Failure Mother   . Diabetes Mother   . Stroke Father   . Cancer Maternal Aunt     Breast cancer    Social History:  reports that she quit smoking about 5 years ago. She has never used smokeless tobacco. She reports that she does not drink alcohol or use illicit drugs.  Review of Systems -    LOW BACK PAIN:  She is having much less low pain in the last few days. Previously was having discomfort related to her getting a ureteral stent  Occasionally may take hydrocodone for different aches and pains Previously has had epidural steroids which has reduced her radiculopathy  ?  Diastolic dysfunction; previously present .   She is taking Lasix regularly every other day and does not want to stop this. She thinks that she will get short of breath if she does not take it regularly However now she is taking this only when she is planning to be more active going out of the house  No recent edema   Hyperlipidemia: Has history of high  LDL and triglycerides/low HDL  Was changed from Lipitor to Crestor because of higher LDL and she was not wanting to pay the higher cost of Zetia with the Lipitor  LDL is still over 70 Tends to have high triglycerides also despite taking fenofibrate No recurrence of liver dysfunction   Lab Results  Component Value Date   CHOL  158 12/27/2014   HDL 28.60* 12/27/2014   LDLCALC 49 09/13/2013   LDLDIRECT 83.0 12/27/2014   TRIG 247.0* 12/27/2014   CHOLHDL 6 12/27/2014   Lab Results  Component Value Date   ALT 20 03/31/2015    Iron Deficiency anemia:  Has had mild anemia with  iron deficiency. Takes iron  Every other day and hemoglobin is stable  Also taking B12.     Lab Results  Component Value Date   WBC 9.0 11/20/2014   HGB 12.6 03/07/2015   HCT 37.0 03/07/2015   MCV 86.7 11/20/2014   PLT 324.0 11/20/2014    Vitamin D deficiency: She has been on supplements and taking her weekly dose, last level was checked in 04/2013       Examination:   BP 118/58 mmHg  Pulse 65  Temp(Src) 97.8 F (36.6 C)  Resp 16  Ht 5\' 7"  (1.702 m)  Wt 152 lb 6.4 oz (69.128 kg)  BMI 23.86 kg/m2  SpO2 93%  Body mass index is 23.86 kg/(m^2).    No pedal edema present  Assesment/PLAN:    1. RENAL dysfunction: Creatinine is improved and also has had resolution of her hydronephrosis with stent placement   2.  DIABETES on insulin: Her control has been excellent and has had some weight loss with her having her renal procedure Since was spent his readings a low-normal she can reduce Novolog to 6 units for now No changes Levemir  3.  HYPOTHYROIDISM: Her TSH is only 0.19 and she can take 6-1/2 tablets per week for now but Prescription will be 112  4.  Vitamin D deficiency: We'll need follow-up levels  5. ?  Diastolic dysfunction.  She can take Lasix only as needed since she has low normal blood pressure  Patient Instructions  Take 1/2 Synthroid on Saturdays and 1 on other days  Check blood sugars on waking up 3 times a week Also check blood sugars about 2 hours after a meal and do this after different meals by rotation  Recommended blood sugar levels on waking up is 90-130 and about 2 hours after meal is 130-160  Please bring your blood sugar monitor to each visit, thank you  Reduce Novolog to 6 until eating  well  Take Lasix as needed  Stop magnesium when out     [facility]  04/04/2015

## 2015-04-11 DIAGNOSIS — N1330 Unspecified hydronephrosis: Secondary | ICD-10-CM | POA: Diagnosis not present

## 2015-05-06 DIAGNOSIS — H52223 Regular astigmatism, bilateral: Secondary | ICD-10-CM | POA: Diagnosis not present

## 2015-05-06 DIAGNOSIS — E119 Type 2 diabetes mellitus without complications: Secondary | ICD-10-CM | POA: Diagnosis not present

## 2015-05-06 DIAGNOSIS — H524 Presbyopia: Secondary | ICD-10-CM | POA: Diagnosis not present

## 2015-05-06 DIAGNOSIS — H35373 Puckering of macula, bilateral: Secondary | ICD-10-CM | POA: Diagnosis not present

## 2015-05-06 DIAGNOSIS — H40053 Ocular hypertension, bilateral: Secondary | ICD-10-CM | POA: Diagnosis not present

## 2015-05-06 LAB — HM DIABETES EYE EXAM

## 2015-05-07 ENCOUNTER — Encounter: Payer: Self-pay | Admitting: *Deleted

## 2015-06-05 ENCOUNTER — Other Ambulatory Visit: Payer: Self-pay | Admitting: Endocrinology

## 2015-06-23 ENCOUNTER — Other Ambulatory Visit: Payer: Self-pay | Admitting: Endocrinology

## 2015-06-26 ENCOUNTER — Telehealth: Payer: Self-pay | Admitting: Endocrinology

## 2015-06-26 ENCOUNTER — Other Ambulatory Visit: Payer: Self-pay | Admitting: *Deleted

## 2015-06-26 MED ORDER — HYDROCODONE-ACETAMINOPHEN 5-325 MG PO TABS: ORAL_TABLET | ORAL | Status: DC

## 2015-06-26 NOTE — Telephone Encounter (Signed)
rx is on your desk for [facility].

## 2015-06-26 NOTE — Telephone Encounter (Signed)
PT is coming in for blood work on Monday and would like to know if she can get her Hydrocodone Rx when she comes in for her appt.

## 2015-06-29 ENCOUNTER — Other Ambulatory Visit: Payer: Self-pay | Admitting: Endocrinology

## 2015-06-30 ENCOUNTER — Other Ambulatory Visit (INDEPENDENT_AMBULATORY_CARE_PROVIDER_SITE_OTHER): Payer: Medicare Other

## 2015-06-30 DIAGNOSIS — E038 Other specified hypothyroidism: Secondary | ICD-10-CM

## 2015-06-30 DIAGNOSIS — E063 Autoimmune thyroiditis: Secondary | ICD-10-CM

## 2015-06-30 DIAGNOSIS — E119 Type 2 diabetes mellitus without complications: Secondary | ICD-10-CM | POA: Diagnosis not present

## 2015-06-30 LAB — COMPREHENSIVE METABOLIC PANEL
ALT: 16 U/L (ref 0–35)
AST: 21 U/L (ref 0–37)
Albumin: 3.4 g/dL — ABNORMAL LOW (ref 3.5–5.2)
Alkaline Phosphatase: 73 U/L (ref 39–117)
BUN: 25 mg/dL — ABNORMAL HIGH (ref 6–23)
CALCIUM: 9.5 mg/dL (ref 8.4–10.5)
CHLORIDE: 110 meq/L (ref 96–112)
CO2: 24 meq/L (ref 19–32)
Creatinine, Ser: 1.32 mg/dL — ABNORMAL HIGH (ref 0.40–1.20)
GFR: 41.79 mL/min — AB (ref >60.00)
Glucose, Bld: 108 mg/dL — ABNORMAL HIGH (ref 70–99)
Potassium: 4.1 mEq/L (ref 3.5–5.1)
Sodium: 141 mEq/L (ref 135–145)
Total Bilirubin: 0.3 mg/dL (ref 0.2–1.2)
Total Protein: 6.6 g/dL (ref 6.0–8.3)

## 2015-06-30 LAB — T4, FREE: Free T4: 1.09 ng/dL (ref 0.60–1.60)

## 2015-06-30 LAB — LIPID PANEL
CHOLESTEROL: 131 mg/dL (ref 0–200)
HDL: 26.9 mg/dL — AB (ref >39.00)
NonHDL: 104.25
TRIGLYCERIDES: 272 mg/dL — AB (ref 0.0–149.0)
Total CHOL/HDL Ratio: 5
VLDL: 54.4 mg/dL — ABNORMAL HIGH (ref 0.0–40.0)

## 2015-06-30 LAB — MICROALBUMIN / CREATININE URINE RATIO
CREATININE, U: 73.6 mg/dL
MICROALB/CREAT RATIO: 162.3 mg/g — AB (ref 0.0–30.0)
Microalb, Ur: 119.4 mg/dL — ABNORMAL HIGH (ref 0.0–1.9)

## 2015-06-30 LAB — HEMOGLOBIN A1C: Hgb A1c MFr Bld: 6.4 % (ref 4.6–6.5)

## 2015-06-30 LAB — TSH: TSH: 0.62 u[IU]/mL (ref 0.35–4.50)

## 2015-06-30 LAB — LDL CHOLESTEROL, DIRECT: Direct LDL: 59 mg/dL

## 2015-07-03 ENCOUNTER — Ambulatory Visit (INDEPENDENT_AMBULATORY_CARE_PROVIDER_SITE_OTHER): Payer: Medicare Other | Admitting: Endocrinology

## 2015-07-03 ENCOUNTER — Encounter: Payer: Self-pay | Admitting: Endocrinology

## 2015-07-03 VITALS — BP 134/60 | HR 69 | Temp 98.0°F | Resp 14 | Ht 67.0 in | Wt 148.0 lb

## 2015-07-03 DIAGNOSIS — N390 Urinary tract infection, site not specified: Secondary | ICD-10-CM | POA: Diagnosis not present

## 2015-07-03 DIAGNOSIS — E119 Type 2 diabetes mellitus without complications: Secondary | ICD-10-CM

## 2015-07-03 DIAGNOSIS — D649 Anemia, unspecified: Secondary | ICD-10-CM

## 2015-07-03 DIAGNOSIS — R634 Abnormal weight loss: Secondary | ICD-10-CM | POA: Diagnosis not present

## 2015-07-03 DIAGNOSIS — E038 Other specified hypothyroidism: Secondary | ICD-10-CM

## 2015-07-03 DIAGNOSIS — E782 Mixed hyperlipidemia: Secondary | ICD-10-CM

## 2015-07-03 DIAGNOSIS — E063 Autoimmune thyroiditis: Secondary | ICD-10-CM

## 2015-07-03 LAB — URINALYSIS, ROUTINE W REFLEX MICROSCOPIC
BILIRUBIN URINE: NEGATIVE
Ketones, ur: NEGATIVE
NITRITE: NEGATIVE
PH: 5.5 (ref 5.0–8.0)
Specific Gravity, Urine: 1.02 (ref 1.000–1.030)
Total Protein, Urine: 100 — AB
Urine Glucose: NEGATIVE
Urobilinogen, UA: 0.2 (ref 0.0–1.0)

## 2015-07-03 MED ORDER — METOCLOPRAMIDE HCL 5 MG PO TABS: 5.0000 mg | ORAL_TABLET | Freq: Four times a day (QID) | ORAL | Status: DC

## 2015-07-03 NOTE — Progress Notes (Signed)
Patient ID: [name] [name], female   DOB: [birth year], 75 y.o.   MRN: [id number]   Reason for Appointment:  Multiple problems, follow-up  History of Present Illness   RENAL dysfunction:  Renal function is nearly normal now and she has not had any infections since she had a stent in her ureter   Lab Results  Component Value Date   CREATININE 1.32* 06/30/2015   CREATININE 1.41* 03/31/2015   CREATININE 1.50* 03/07/2015   CREATININE 1.76* 01/17/2015   Lab Results  Component Value Date   CREATININE 1.32* 06/30/2015   BUN 25* 06/30/2015   NA 141 06/30/2015   K 4.1 06/30/2015   CL 110 06/30/2015   CO2 24 06/30/2015     URINARY tract infection:  She previously had pyelonephritis and urine culture done showed Escherichia coli sensitive to all organisms  For the last week she has had pressure and some increased frequency of urination but no significant discomfort, no fever    Type 2 diabetes mellitus, date of diagnosis: 1986.   The insulin regimen is: Levemir 20 units hs, Novolog 6-8 ac twice a day   Type 2 diabetes  has been treated in the last few years with low dose basal bolus insulin regimen. She has not been taking  any oral hypoglycemic drugs including metformin partly because of GI side effects  A1c is fairly good at 6.4  Current blood sugar patterns and management:  Although she has some fluctuation in her blood sugars at all times they are overall fairly well controlled  She is eating smaller portions currently She is compliant with her mealtime insulin, not making any adjustments for types of meals Hypoglycemia not present Mostly eating 2 meals a day  Side effects from medications: Diarrhea from metformin and nausea and vomiting from GLP-1 drugs   Monitors blood glucose: Less than 1 times a day  Glucometer: One Touch.  Blood Glucose readings:   Mean values apply above for all meters except median for One Touch  PRE-MEAL Fasting Lunch Dinner  Bedtime Overall  Glucose range: 101-165  59-142   104-180    Mean/median: 110    152  113    Meals: she is usually eating low fat meals but is sometimes eating out at lunch; meals usually at 11 AM,and supper at 5 pm.  Calorie intake: Usually controlled. Moderate Carbs Physical activity: exercise: Unable to do any   Dietician visit: Most recent:, 5/13.   Wt Readings from Last 3 Encounters:  07/03/15 148 lb (67.132 kg)  04/04/15 152 lb 6.4 oz (69.128 kg)  03/07/15 155 lb (70.308 kg)    Lab Results  Component Value Date   HGBA1C 6.4 06/30/2015   HGBA1C 5.6 12/27/2014   HGBA1C 6.6 10/25/2014   Lab Results  Component Value Date   MICROALBUR 119.4* 06/30/2015   LDLCALC 49 09/13/2013   CREATININE 1.32* 06/30/2015     3.  HYPOTHYROIDISM: She has had long-standing primary hypothyroidism TSH has been relatively low and because of her level of 0.21 she was told to take only 6-1/2 tablets a week of her 125 g dose TSH is back to normal    Lab Results  Component Value Date   TSH 0.62 06/30/2015   TSH 0.21* 03/31/2015   TSH 1.59 11/20/2014   FREET4 1.09 06/30/2015   FREET4 1.33 07/22/2014   FREET4 1.29 04/24/2014     4.  WEIGHT loss: This is continuing.  She said that she is not able  to eat much when she sits down to eat but has no nausea, abdominal bloating, discomfort, change in bowel habits or black stools       Medication List       This list is accurate as of: 07/03/15  9:03 PM.  Always use your most recent med list.               albuterol 108 (90 Base) MCG/ACT inhaler  Commonly known as:  PROVENTIL HFA;VENTOLIN HFA  Inhale 2 puffs into the lungs every 6 (six) hours as needed for wheezing.     aspirin 81 MG chewable tablet  Chew 81 mg by mouth at bedtime.     calcium-vitamin D 500-200 MG-UNIT tablet  Commonly known as:  OSCAL WITH D  Take 1 tablet by mouth every morning.     clopidogrel 75 MG tablet  Commonly known as:  PLAVIX  TAKE 1 TABLET EVERY  MORNING     Cranberry 250 MG Tabs  Take 1 tablet by mouth daily.     CRESTOR 20 MG tablet  Generic drug:  rosuvastatin  take 1 tablet by mouth once daily     ferrous sulfate 325 (65 FE) MG tablet  Take 325 mg by mouth 3 (three) times a week.     furosemide 20 MG tablet  Commonly known as:  LASIX  take 1 tablet by mouth once daily     HYDROcodone-acetaminophen 5-325 MG tablet  Commonly known as:  NORCO/VICODIN  Take 1 tablet every 6 hours as needed for back pain     insulin aspart 100 UNIT/ML FlexPen  Commonly known as:  NOVOLOG  Inject 8 units three times a day with meals     Insulin Detemir 100 UNIT/ML Pen  Commonly known as:  LEVEMIR FLEXTOUCH  Inject 22 units at bedtime.     lactobacillus acidophilus Tabs tablet  Take 2 tablets by mouth 3 (three) times daily.     latanoprost 0.005 % ophthalmic solution  Commonly known as:  XALATAN  Place 1 drop into both eyes at bedtime.     levothyroxine 125 MCG tablet  Commonly known as:  SYNTHROID, LEVOTHROID  TAKE 1 TABLET DAILY     metoCLOPramide 5 MG tablet  Commonly known as:  REGLAN  Take 1 tablet (5 mg total) by mouth 4 (four) times daily.     metoprolol succinate 25 MG 24 hr tablet  Commonly known as:  TOPROL-XL  TAKE 1/2 TABLET (=12.5MG )  EVERY MORNING     omeprazole 20 MG capsule  Commonly known as:  PRILOSEC  TAKE 1 CAPSULE DAILY     vitamin C 500 MG tablet  Commonly known as:  ASCORBIC ACID  Take 500 mg by mouth every morning.     Vitamin D (Ergocalciferol) 50000 units Caps capsule  Commonly known as:  DRISDOL  take 1 capsule by mouth every 7 DAYS TAKE ON THURSDAY        Allergies: No Known Allergies  Past Medical History  Diagnosis Date  . CHF (congestive heart failure) ([place])   . Hypertension   . Hyperlipidemia   . Hypothyroidism   . Ischemic cardiomyopathy     03-25-2010-- per lasts echo EF  50-55%  . Coronary artery disease due to lipid rich plaque cardiologist-  dr berry    s/p CABG x6  1997-- cath 12-09-2009 occluded vein to obtuse marginal branch and ramus branch with patent vien to PDA and patent LIMA to LAD, ef 40%-- Myoview 11-24-2011,  nonischemic  . Type 2 diabetes mellitus ([place])     monitored by dr Dwyane Dee  . Retroperitoneal fibrosis   . Bilateral hydronephrosis   . CKD (chronic kidney disease), stage III   . History of sepsis     10-18-2014 w/ acute pyelonephritis  . Dyspnea on exertion   . PVD (peripheral vascular disease) with claudication ([place])   . Carotid artery stenosis     carotid doppler 06/2012 - Right CCA/Bulb/ICA with chronic occlusion; L vertebral artery with abnormal blood flow; L Bulb/Prox ICA  s/p endarterectomy with mild fibrous plaque, 50% diameter reduction  . PAD (peripheral artery disease) ([place])     09/2010 LEAs - R ABI of 0.45, occluded fem-pop bypass graft, L ABI of 0.59 with occluded SFA; severe arterial insuff  . GERD (gastroesophageal reflux disease)     Past Surgical History  Procedure Laterality Date  . Cystoscopy w/ ureteral stent placement  03/10/2012    Procedure: CYSTOSCOPY WITH RETROGRADE PYELOGRAM/URETERAL STENT PLACEMENT;  Surgeon: Hanley Ben, MD;  Location: [facility];  Service: Urology;  Laterality: Left;  . Carotid endarterectomy Bilateral right 1994//  left ?  Marland Kitchen Transthoracic echocardiogram  03/25/2010    EF 99991111, LV systolic function low normal with mild inferoseptal hypocontractility; LA mildly dilated; mod MR; mild TR, RV systolic pressure elevated, mild pulm HTN; AV mildly sclerotic; mild pulm valve regurg; aortic root sclerosis/calcif   . Coronary artery bypass graft  1997    x6; internal mammary to LAD, SVG to ramus #1 & #2, SVG to OM, SVG to PDA,   . Cardiac catheterization  12-09-2009  dr al little    EF >40%-- occluded vein to OM & ramus branches, patent vein to PDA, patent LIMA to LAD (Dr. [name], [facility]) - later had thrombectomy of R fem-pop bypass ad R common femoral & profunda femoris artery (Dr. Oneida Alar)  . Repair right  femoral pseudoaneuysm/  right fem-pop bypass graft/  debridement right lower extremitiy venous status ulcers x2  01-12-2005  . Aorta - bilateral femoral artery bypass graft  1997    and RIGHT FEM-POP   . Cardiovascular stress test  11-24-2011   dr berry    Low Risk study: fixed basal to mid inferior attenuation artifact, no reversible ischemia,  normal LV function and wall motion , ef 67%  . Total abdominal hysterectomy w/ bilateral salpingoophorectomy  1986  . Cataract extraction w/ intraocular lens  implant, bilateral  2006  . Cystoscopy w/ ureteral stent placement Bilateral 03/07/2015    Procedure: BILATERAL RETROGRADE PYELOGRAM AND RIGHT URETERAL STENT PLACEMENT;  Surgeon: Ardis Hughs, MD;  Location: [facility];  Service: Urology;  Laterality: Bilateral;    Family History  Problem Relation Age of Onset  . Congestive Heart Failure Mother   . Diabetes Mother   . Stroke Father   . Cancer Maternal Aunt     Breast cancer    Social History:  reports that she quit smoking about 6 years ago. She has never used smokeless tobacco. She reports that she does not drink alcohol or use illicit drugs.  Review of Systems -   [place] satiety present for the last few weeks and she is losing weight    Back pain:  This is intermittent and has had some discomfort on the left side recently  ?  Diastolic dysfunction; previously present .   No further dyspnea or ankle edema and not taking Lasix usually    Hyperlipidemia: Has history of high LDL  and triglycerides/low HDL  Was changed from Lipitor to Crestor because of higher LDL and she was not wanting to pay the higher cost of adding Zetia   LDL is improved partly from her weight loss and decreased appetite  Tends to have high triglycerides also despite taking fenofibrate No recurrence of liver dysfunction   Lab Results  Component Value Date   CHOL 131 06/30/2015   HDL 26.90* 06/30/2015   LDLCALC 49 09/13/2013    LDLDIRECT 59.0 06/30/2015   TRIG 272.0* 06/30/2015   CHOLHDL 5 06/30/2015   Lab Results  Component Value Date   ALT 16 06/30/2015    Iron Deficiency anemia:  Has had mild anemia with  iron deficiencyPreviously. Takes iron 3 times a week and hemoglobin is stable  Also taking B12.     Lab Results  Component Value Date   WBC 9.0 11/20/2014   HGB 12.6 03/07/2015   HCT 37.0 03/07/2015   MCV 86.7 11/20/2014   PLT 324.0 11/20/2014    Vitamin D deficiency: She has been on supplements and taking her weekly dose, last level was checked in 04/2013    Examination:   BP 134/60 mmHg  Pulse 69  Temp(Src) 98 F (36.7 C)  Resp 14  Ht 5\' 7"  (1.702 m)  Wt 148 lb (67.132 kg)  BMI 23.17 kg/m2  SpO2 95%  Body mass index is 23.17 kg/(m^2).    No lymphadenopathy in the neck Mucous membranes moist No flank tenderness Abdomen shows no distention, succussion splash, mass or tenderness No spinal tenderness No ankle edema  Diabetic Foot Exam - Simple   Simple Foot Form  Diabetic Foot exam was performed with the following findings:  Yes   Visual Inspection  No deformities, no ulcerations, no other skin breakdown bilaterally:  Yes  Sensation Testing  Intact to touch and monofilament testing bilaterally:  Yes  Pulse Check  See comments:  Yes  Comments  Absent pedal pulses        Assesment/PLAN:    1. RENAL dysfunction: Creatinine is improved and nearly normal She does have increased urine microalbumin but not sure this is accurate because of her possible UTI today  Her symptoms of bladder pressure and frequency need to be evaluated with urinalysis and culture today  2.  DIABETES on insulin: Her control has been generally good She is eating smaller portions and usually not having high readings especially after breakfast She will continue same insulin regimen and monitor at sugars at various times Encouraged her to be as active as possible  3.  WEIGHT loss: Etiology unclear.   No obvious organic cause.  She has had previous issues with weight loss at times Currently with decreased satiety may have mild gastroparesis and will empirically try Reglan. Will check her hemoglobin and if need be Hemoccult on the next visit She will call if not better, discussed benefits and side effects of Reglan   4.  HYPOTHYROIDISM: Her TSH is back to normal with 6-1/2 tablets of 125 g dosage  [name] [name]  07/03/2015   Addendum: Urine shows UTI, to take Cipro 500 twice a day for 7 days

## 2015-07-04 ENCOUNTER — Ambulatory Visit (HOSPITAL_COMMUNITY)
Admission: RE | Admit: 2015-07-04 | Discharge: 2015-07-04 | Disposition: A | Discharge status: Home / Self Care | Payer: Medicare Other | Source: Ambulatory Visit | Attending: Urology | Admitting: Urology

## 2015-07-04 ENCOUNTER — Other Ambulatory Visit: Payer: Self-pay | Admitting: *Deleted

## 2015-07-04 DIAGNOSIS — K219 Gastro-esophageal reflux disease without esophagitis: Secondary | ICD-10-CM | POA: Insufficient documentation

## 2015-07-04 DIAGNOSIS — I13 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 1 through stage 4 chronic kidney disease, or unspecified chronic kidney disease: Secondary | ICD-10-CM | POA: Diagnosis not present

## 2015-07-04 DIAGNOSIS — I509 Heart failure, unspecified: Secondary | ICD-10-CM | POA: Insufficient documentation

## 2015-07-04 DIAGNOSIS — I6523 Occlusion and stenosis of bilateral carotid arteries: Secondary | ICD-10-CM | POA: Insufficient documentation

## 2015-07-04 DIAGNOSIS — N183 Chronic kidney disease, stage 3 (moderate): Secondary | ICD-10-CM | POA: Diagnosis not present

## 2015-07-04 DIAGNOSIS — E1122 Type 2 diabetes mellitus with diabetic chronic kidney disease: Secondary | ICD-10-CM | POA: Insufficient documentation

## 2015-07-04 LAB — URINE CULTURE: ORGANISM ID, BACTERIA: NO GROWTH

## 2015-07-04 MED ORDER — CIPROFLOXACIN HCL 500 MG PO TABS: ORAL_TABLET | ORAL | Status: DC

## 2015-07-08 ENCOUNTER — Telehealth: Payer: Self-pay | Admitting: *Deleted

## 2015-07-08 DIAGNOSIS — I739 Peripheral vascular disease, unspecified: Principal | ICD-10-CM

## 2015-07-08 DIAGNOSIS — I779 Disorder of arteries and arterioles, unspecified: Secondary | ICD-10-CM

## 2015-07-08 NOTE — Telephone Encounter (Signed)
Results called to pt

## 2015-07-08 NOTE — Telephone Encounter (Signed)
-----   Message from [name], MD sent at 07/08/2015 10:05 AM EDT ----- No change from prior study. Repeat in 12 months.

## 2015-07-17 DIAGNOSIS — N390 Urinary tract infection, site not specified: Secondary | ICD-10-CM | POA: Diagnosis not present

## 2015-07-17 DIAGNOSIS — Z0000 Encounter for general adult medical examination without abnormal findings: Secondary | ICD-10-CM | POA: Diagnosis not present

## 2015-07-18 ENCOUNTER — Other Ambulatory Visit: Payer: Self-pay | Admitting: Urology

## 2015-07-22 ENCOUNTER — Other Ambulatory Visit: Payer: Self-pay | Admitting: Urology

## 2015-07-24 ENCOUNTER — Encounter (HOSPITAL_BASED_OUTPATIENT_CLINIC_OR_DEPARTMENT_OTHER): Payer: Self-pay | Admitting: *Deleted

## 2015-07-25 ENCOUNTER — Encounter (HOSPITAL_BASED_OUTPATIENT_CLINIC_OR_DEPARTMENT_OTHER): Payer: Self-pay | Admitting: *Deleted

## 2015-07-25 NOTE — Progress Notes (Addendum)
NPO AFTER MN.  ARRIVE AT 0715.  NEEDS ISTAT. CURRENT EKG IN CHART AND EPIC.  WILL TAKE PRILOSEC, SYNTHROID, TOPROL, AND CRESTOR AM DOS W/ SIPS OF WATER.  PT WILL ALSO DO HALF DOSE LEVEMIR HS BEFORE DOS.   REVIEWED CHART W/ DR SINGER MDA,  DUE TO LAST SURGERY AT [facility] 12/ 2016 GIVEN ASA IV.  DR Tobias Alexander STATED OK TO PROCEED SINCE PT HAS NO CARDIAC OR PULMONARY SYMPTOMS.

## 2015-07-28 DIAGNOSIS — N390 Urinary tract infection, site not specified: Secondary | ICD-10-CM | POA: Diagnosis not present

## 2015-07-28 DIAGNOSIS — R3915 Urgency of urination: Secondary | ICD-10-CM | POA: Diagnosis not present

## 2015-07-28 DIAGNOSIS — Z0000 Encounter for general adult medical examination without abnormal findings: Secondary | ICD-10-CM | POA: Diagnosis not present

## 2015-08-01 ENCOUNTER — Ambulatory Visit (HOSPITAL_BASED_OUTPATIENT_CLINIC_OR_DEPARTMENT_OTHER): Payer: Medicare Other | Admitting: Anesthesiology

## 2015-08-01 ENCOUNTER — Encounter (HOSPITAL_BASED_OUTPATIENT_CLINIC_OR_DEPARTMENT_OTHER)
Admission: RE | Disposition: A | Discharge status: Home / Self Care | Payer: Self-pay | Source: Ambulatory Visit | Attending: Urology

## 2015-08-01 ENCOUNTER — Encounter (HOSPITAL_BASED_OUTPATIENT_CLINIC_OR_DEPARTMENT_OTHER): Payer: Self-pay | Admitting: *Deleted

## 2015-08-01 ENCOUNTER — Ambulatory Visit (HOSPITAL_BASED_OUTPATIENT_CLINIC_OR_DEPARTMENT_OTHER)
Admission: RE | Admit: 2015-08-01 | Discharge: 2015-08-01 | Disposition: A | Discharge status: Home / Self Care | Payer: Medicare Other | Source: Ambulatory Visit | Attending: Urology | Admitting: Urology

## 2015-08-01 DIAGNOSIS — Z794 Long term (current) use of insulin: Secondary | ICD-10-CM | POA: Insufficient documentation

## 2015-08-01 DIAGNOSIS — N1330 Unspecified hydronephrosis: Secondary | ICD-10-CM | POA: Diagnosis not present

## 2015-08-01 DIAGNOSIS — R3989 Other symptoms and signs involving the genitourinary system: Secondary | ICD-10-CM | POA: Diagnosis present

## 2015-08-01 DIAGNOSIS — I110 Hypertensive heart disease with heart failure: Secondary | ICD-10-CM | POA: Insufficient documentation

## 2015-08-01 DIAGNOSIS — Z951 Presence of aortocoronary bypass graft: Secondary | ICD-10-CM | POA: Insufficient documentation

## 2015-08-01 DIAGNOSIS — N131 Hydronephrosis with ureteral stricture, not elsewhere classified: Secondary | ICD-10-CM

## 2015-08-01 DIAGNOSIS — Z79899 Other long term (current) drug therapy: Secondary | ICD-10-CM | POA: Insufficient documentation

## 2015-08-01 DIAGNOSIS — D649 Anemia, unspecified: Secondary | ICD-10-CM | POA: Insufficient documentation

## 2015-08-01 DIAGNOSIS — M1990 Unspecified osteoarthritis, unspecified site: Secondary | ICD-10-CM | POA: Diagnosis not present

## 2015-08-01 DIAGNOSIS — N390 Urinary tract infection, site not specified: Secondary | ICD-10-CM | POA: Diagnosis not present

## 2015-08-01 DIAGNOSIS — I129 Hypertensive chronic kidney disease with stage 1 through stage 4 chronic kidney disease, or unspecified chronic kidney disease: Secondary | ICD-10-CM | POA: Diagnosis not present

## 2015-08-01 DIAGNOSIS — Z79891 Long term (current) use of opiate analgesic: Secondary | ICD-10-CM | POA: Diagnosis not present

## 2015-08-01 DIAGNOSIS — E039 Hypothyroidism, unspecified: Secondary | ICD-10-CM | POA: Diagnosis not present

## 2015-08-01 DIAGNOSIS — Z7982 Long term (current) use of aspirin: Secondary | ICD-10-CM | POA: Insufficient documentation

## 2015-08-01 DIAGNOSIS — K219 Gastro-esophageal reflux disease without esophagitis: Secondary | ICD-10-CM | POA: Insufficient documentation

## 2015-08-01 DIAGNOSIS — I2510 Atherosclerotic heart disease of native coronary artery without angina pectoris: Secondary | ICD-10-CM | POA: Diagnosis not present

## 2015-08-01 DIAGNOSIS — I509 Heart failure, unspecified: Secondary | ICD-10-CM | POA: Diagnosis not present

## 2015-08-01 DIAGNOSIS — Z841 Family history of disorders of kidney and ureter: Secondary | ICD-10-CM | POA: Diagnosis not present

## 2015-08-01 DIAGNOSIS — E1151 Type 2 diabetes mellitus with diabetic peripheral angiopathy without gangrene: Secondary | ICD-10-CM | POA: Diagnosis not present

## 2015-08-01 DIAGNOSIS — N183 Chronic kidney disease, stage 3 (moderate): Secondary | ICD-10-CM | POA: Diagnosis not present

## 2015-08-01 DIAGNOSIS — E7800 Pure hypercholesterolemia, unspecified: Secondary | ICD-10-CM | POA: Diagnosis not present

## 2015-08-01 DIAGNOSIS — Z87891 Personal history of nicotine dependence: Secondary | ICD-10-CM | POA: Insufficient documentation

## 2015-08-01 DIAGNOSIS — N135 Crossing vessel and stricture of ureter without hydronephrosis: Secondary | ICD-10-CM | POA: Diagnosis not present

## 2015-08-01 HISTORY — PX: CYSTOSCOPY W/ RETROGRADES: SHX1426

## 2015-08-01 HISTORY — PX: CYSTOSCOPY W/ URETERAL STENT PLACEMENT: SHX1429

## 2015-08-01 LAB — GLUCOSE, CAPILLARY: GLUCOSE-CAPILLARY: 155 mg/dL — AB (ref 65–99)

## 2015-08-01 LAB — POCT I-STAT 4, (NA,K, GLUC, HGB,HCT)
GLUCOSE: 128 mg/dL — AB (ref 65–99)
HCT: 29 % — ABNORMAL LOW (ref 36.0–46.0)
HEMOGLOBIN: 9.9 g/dL — AB (ref 12.0–15.0)
POTASSIUM: 5.9 mmol/L — AB (ref 3.5–5.1)
Sodium: 145 mmol/L (ref 135–145)

## 2015-08-01 SURGERY — CYSTOSCOPY, FLEXIBLE, WITH STENT REPLACEMENT
Anesthesia: Monitor Anesthesia Care | Site: Ureter | Laterality: Right

## 2015-08-01 MED ORDER — CIPROFLOXACIN IN D5W 400 MG/200ML IV SOLN
INTRAVENOUS | Status: AC
  Filled 2015-08-01: qty 200

## 2015-08-01 MED ORDER — PHENAZOPYRIDINE HCL 100 MG PO TABS
ORAL_TABLET | ORAL | Status: AC
  Filled 2015-08-01: qty 2

## 2015-08-01 MED ORDER — BELLADONNA ALKALOIDS-OPIUM 16.2-60 MG RE SUPP
RECTAL | Status: AC
  Filled 2015-08-01: qty 1

## 2015-08-01 MED ORDER — OXYCODONE HCL 5 MG PO TABS
5.0000 mg | ORAL_TABLET | Freq: Once | ORAL | Status: DC | PRN
  Filled 2015-08-01: qty 1

## 2015-08-01 MED ORDER — FENTANYL CITRATE (PF) 100 MCG/2ML IJ SOLN
INTRAMUSCULAR | Status: DC | PRN
  Administered 2015-08-01 (×3): 25 ug via INTRAVENOUS

## 2015-08-01 MED ORDER — FENTANYL CITRATE (PF) 100 MCG/2ML IJ SOLN
25.0000 ug | INTRAMUSCULAR | Status: DC | PRN
  Filled 2015-08-01: qty 1

## 2015-08-01 MED ORDER — PHENAZOPYRIDINE HCL 200 MG PO TABS
200.0000 mg | ORAL_TABLET | Freq: Three times a day (TID) | ORAL | Status: AC
  Administered 2015-08-01: 200 mg via ORAL
  Filled 2015-08-01: qty 1

## 2015-08-01 MED ORDER — STERILE WATER FOR IRRIGATION IR SOLN
Status: DC | PRN
  Administered 2015-08-01: 3000 mL

## 2015-08-01 MED ORDER — DEXAMETHASONE SODIUM PHOSPHATE 4 MG/ML IJ SOLN
INTRAMUSCULAR | Status: DC | PRN
  Administered 2015-08-01: 10 mg via INTRAVENOUS

## 2015-08-01 MED ORDER — OXYCODONE HCL 5 MG/5ML PO SOLN
5.0000 mg | Freq: Once | ORAL | Status: DC | PRN
Start: 2015-08-01 — End: 2015-08-01
  Filled 2015-08-01: qty 5

## 2015-08-01 MED ORDER — PROPOFOL 10 MG/ML IV BOLUS
INTRAVENOUS | Status: AC
Start: 2015-08-01 — End: 2015-08-01
  Filled 2015-08-01: qty 40

## 2015-08-01 MED ORDER — SODIUM CHLORIDE 0.9 % IV SOLN
INTRAVENOUS | Status: DC
  Administered 2015-08-01: 08:00:00 via INTRAVENOUS
  Filled 2015-08-01: qty 1000

## 2015-08-01 MED ORDER — LIDOCAINE HCL (CARDIAC) 20 MG/ML IV SOLN
INTRAVENOUS | Status: DC | PRN
  Administered 2015-08-01: 50 mg via INTRAVENOUS

## 2015-08-01 MED ORDER — BELLADONNA ALKALOIDS-OPIUM 16.2-60 MG RE SUPP
RECTAL | Status: DC | PRN
  Administered 2015-08-01: 1 via RECTAL

## 2015-08-01 MED ORDER — PROPOFOL 500 MG/50ML IV EMUL
INTRAVENOUS | Status: DC | PRN
  Administered 2015-08-01: 200 ug/kg/min via INTRAVENOUS

## 2015-08-01 MED ORDER — DIATRIZOATE MEGLUMINE 30 % UR SOLN
URETHRAL | Status: DC | PRN
  Administered 2015-08-01: 5 mL via URETHRAL

## 2015-08-01 MED ORDER — HYDROCODONE-ACETAMINOPHEN 5-325 MG PO TABS: ORAL_TABLET | ORAL | Status: DC

## 2015-08-01 MED ORDER — ONDANSETRON HCL 4 MG/2ML IJ SOLN
INTRAMUSCULAR | Status: AC
  Filled 2015-08-01: qty 2

## 2015-08-01 MED ORDER — CIPROFLOXACIN IN D5W 400 MG/200ML IV SOLN
400.0000 mg | Freq: Two times a day (BID) | INTRAVENOUS | Status: DC
  Administered 2015-08-01: 400 mg via INTRAVENOUS
  Filled 2015-08-01: qty 200

## 2015-08-01 MED ORDER — DEXAMETHASONE SODIUM PHOSPHATE 10 MG/ML IJ SOLN
INTRAMUSCULAR | Status: AC
  Filled 2015-08-01: qty 1

## 2015-08-01 MED ORDER — PROMETHAZINE HCL 25 MG/ML IJ SOLN
6.2500 mg | INTRAMUSCULAR | Status: DC | PRN
  Filled 2015-08-01: qty 1

## 2015-08-01 MED ORDER — FENTANYL CITRATE (PF) 100 MCG/2ML IJ SOLN
INTRAMUSCULAR | Status: AC
  Filled 2015-08-01: qty 2

## 2015-08-01 MED ORDER — LIDOCAINE HCL 2 % EX GEL
CUTANEOUS | Status: DC | PRN
  Administered 2015-08-01: 1 via URETHRAL

## 2015-08-01 MED ORDER — PHENAZOPYRIDINE HCL 200 MG PO TABS: 200.0000 mg | ORAL_TABLET | Freq: Three times a day (TID) | ORAL | Status: DC | PRN

## 2015-08-01 MED ORDER — LACTATED RINGERS IV SOLN
INTRAVENOUS | Status: DC
  Filled 2015-08-01: qty 1000

## 2015-08-01 SURGICAL SUPPLY — 41 items
ADAPTER CATH URET PLST 4-6FR (CATHETERS) IMPLANT
BAG DRAIN URO-CYSTO SKYTR STRL (DRAIN) ×5 IMPLANT
BASKET LASER NITINOL 1.9FR (BASKET) IMPLANT
BASKET STNLS GEMINI 4WIRE 3FR (BASKET) IMPLANT
BASKET ZERO TIP NITINOL 2.4FR (BASKET) IMPLANT
CANISTER SUCT LVC 12 LTR MEDI- (MISCELLANEOUS) ×5 IMPLANT
CATH INTERMIT  6FR 70CM (CATHETERS) IMPLANT
CATH URET 5FR 28IN CONE TIP (BALLOONS)
CATH URET 5FR 28IN OPEN ENDED (CATHETERS) ×5 IMPLANT
CATH URET 5FR 70CM CONE TIP (BALLOONS) IMPLANT
CATH URET DUAL LUMEN 6-10FR 50 (CATHETERS) IMPLANT
CLOTH BEACON ORANGE TIMEOUT ST (SAFETY) ×5 IMPLANT
DRSG TEGADERM 2-3/8X2-3/4 SM (GAUZE/BANDAGES/DRESSINGS) IMPLANT
FIBER LASER FLEXIVA 365 (UROLOGICAL SUPPLIES) IMPLANT
FIBER LASER TRAC TIP (UROLOGICAL SUPPLIES) IMPLANT
GLOVE BIO SURGEON STRL SZ7.5 (GLOVE) ×5 IMPLANT
GLOVE BIOGEL PI IND STRL 7.0 (GLOVE) ×3 IMPLANT
GLOVE BIOGEL PI IND STRL 7.5 (GLOVE) ×3 IMPLANT
GLOVE BIOGEL PI INDICATOR 7.0 (GLOVE) ×2
GLOVE BIOGEL PI INDICATOR 7.5 (GLOVE) ×2
GOWN STRL REUS W/ TWL XL LVL3 (GOWN DISPOSABLE) ×3 IMPLANT
GOWN STRL REUS W/TWL LRG LVL3 (GOWN DISPOSABLE) ×5 IMPLANT
GOWN STRL REUS W/TWL XL LVL3 (GOWN DISPOSABLE) ×2
GUIDEWIRE 0.038 PTFE COATED (WIRE) IMPLANT
GUIDEWIRE ANG ZIPWIRE 038X150 (WIRE) IMPLANT
GUIDEWIRE STR DUAL SENSOR (WIRE) ×5 IMPLANT
IV NS IRRIG 3000ML ARTHROMATIC (IV SOLUTION) IMPLANT
KIT BALLIN UROMAX 15FX10 (LABEL) IMPLANT
KIT BALLN UROMAX 15FX4 (MISCELLANEOUS) IMPLANT
KIT BALLN UROMAX 26 75X4 (MISCELLANEOUS)
KIT ROOM TURNOVER WOR (KITS) ×5 IMPLANT
MANIFOLD NEPTUNE II (INSTRUMENTS) ×5 IMPLANT
NS IRRIG 500ML POUR BTL (IV SOLUTION) IMPLANT
PACK CYSTO (CUSTOM PROCEDURE TRAY) ×5 IMPLANT
SET HIGH PRES BAL DIL (LABEL)
SHEATH ACCESS URETERAL 38CM (SHEATH) IMPLANT
STENT POLARIS LOOP 6FR X 26 CM (STENTS) ×5 IMPLANT
TUBE CONNECTING 12'X1/4 (SUCTIONS)
TUBE CONNECTING 12X1/4 (SUCTIONS) IMPLANT
TUBE FEEDING 8FR 16IN STR KANG (MISCELLANEOUS) IMPLANT
WATER STERILE IRR 3000ML UROMA (IV SOLUTION) ×5 IMPLANT

## 2015-08-01 NOTE — Anesthesia Procedure Notes (Signed)
Procedure Name: MAC Performed by: Wanita Chamberlain Pre-anesthesia Checklist: Patient identified, Timeout performed, Emergency Drugs available, Suction available and Patient being monitored Patient Re-evaluated:Patient Re-evaluated prior to inductionOxygen Delivery Method: Nasal cannula Intubation Type: IV induction

## 2015-08-01 NOTE — Anesthesia Preprocedure Evaluation (Addendum)
Anesthesia Evaluation  Patient identified by MRN, date of birth, ID band Patient awake    Reviewed: Allergy & Precautions, NPO status , Patient's Chart, lab work & pertinent test results, reviewed documented beta blocker date and time   Airway Mallampati: II  TM Distance: >3 FB Neck ROM: Full    Dental  (+) Poor Dentition, Dental Advisory Given   Pulmonary former smoker,    breath sounds clear to auscultation       Cardiovascular Exercise Tolerance: Poor hypertension, Pt. on medications and Pt. on home beta blockers + CAD, + Peripheral Vascular Disease, +CHF and + DOE   Rhythm:Regular Rate:Normal  s/p CABG x6 1997-- cath 12-09-2009 occluded vein to obtuse marginal branch and ramus branch with patent vien to PDA and patent LIMA to LAD, ef 40%-- Myoview 11-24-2011, nonischemic   Neuro/Psych PSYCHIATRIC DISORDERS negative neurological ROS     GI/Hepatic Neg liver ROS, GERD  Medicated,  Endo/Other  diabetes, Type 2, Insulin Dependent, Oral Hypoglycemic AgentsHypothyroidism   Renal/GU CRFRenal disease  negative genitourinary   Musculoskeletal  (+) Arthritis , Osteoarthritis,    Abdominal   Peds negative pediatric ROS (+)  Hematology  (+) anemia ,   Anesthesia Other Findings - HLD  Reproductive/Obstetrics negative OB ROS                          Lab Results  Component Value Date   WBC 9.0 11/20/2014   HGB 12.6 03/07/2015   HCT 37.0 03/07/2015   MCV 86.7 11/20/2014   PLT 324.0 11/20/2014   Lab Results  Component Value Date   CREATININE 1.32* 06/30/2015   BUN 25* 06/30/2015   NA 141 06/30/2015   K 4.1 06/30/2015   CL 110 06/30/2015   CO2 24 06/30/2015   Lab Results  Component Value Date   INR 0.97 05/07/2012   INR 1.11 03/10/2012   INR 1.17 12/11/2009   10/2014 EKG: normal sinus rhythm.   Anesthesia Physical Anesthesia Plan  ASA: III  Anesthesia Plan: MAC   Post-op Pain  Management:    Induction: Intravenous  Airway Management Planned: Natural Airway and Simple Face Mask  Additional Equipment:   Intra-op Plan:   Post-operative Plan:   Informed Consent: I have reviewed the patients History and Physical, chart, labs and discussed the procedure including the risks, benefits and alternatives for the proposed anesthesia with the patient or authorized representative who has indicated his/her understanding and acceptance.   Dental advisory given  Plan Discussed with: CRNA  Anesthesia Plan Comments:       Anesthesia Quick Evaluation

## 2015-08-01 NOTE — Op Note (Signed)
Note entered in error

## 2015-08-01 NOTE — H&P (Signed)
Reason For Visit recent onset bladder pain   History of Present Illness 75 year old patient who has a history of bilateral hydronephrosis. The patient has a history of retroperitoneal fibrosis and in 2015 she had left sided hydronephrosis. She had a stent placed in the spring of 2015 followed by renogram demonstrating 18% function on the left kidney. Her stent was subsequently removed. Recently she developed bilateral hydronephrosis, renogram demonstrated bilateral obstruction with 85% right function and 15% left sided function a significant change from her previous ultrasounds and renograms. In Jan 2015 she had no hydro or evidence of right sided obstruction.   She subsequently underwent bilateral RPGs in the OR on 02/21/16- left side showed a normal distal ureter to the pelvic brim where it stopped abruptly and I was unable to opacify the proximal portion. On the right side showed a normal distal ureter with abrupt narrowing at the pelvic brim and proximal dilation. A right sided stent was placed.   She has a history of recurrent infections. In 2015 she had 4 Escherichia coli infections.    03/2015: treated for UTI - keflex - no growth. recommdended lactobacillus, as well as cranberry tablets twice daily  2/17: BUN/Cr- 30/1.78  Intv: Seen today for interval f/u. She is doing well. Due to stent exchange. The patient states that she was treated for an infection couple weeks ago by her primary care doctor. Her urine culture was negative after all, but with ciprofloxacin 7 days her symptoms improved. Her last creatinine was drawn on 06/28/15 and was improved at 1.320.   Past Medical History Problems  1. History of Arthritis 2. History of cardiac disorder (Z86.79) 3. History of diabetes mellitus (Z86.39) 4. History of hypercholesterolemia (Z86.39) 5. History of hypertension (Z86.79) 6. History of hypothyroidism (Z86.39)  Surgical History Problems  1. History of Cesarean Section 2. History of  Coronary Artery Single Venous Bypass Graft 3. History of Cystoscopy With Insertion Of Ureteral Stent 4. History of Cystoscopy With Insertion Of Ureteral Stent Left 5. History of Cystoscopy With Insertion Of Ureteral Stent Right 6. History of Hysterectomy  Current Meds 1. Albuterol Sulfate (2.5 MG/3ML) 0.083% Inhalation Nebulization Solution;  Therapy: (Recorded:15Oct2015) to Recorded 2. Aspirin 81 MG TABS;  Therapy: (Recorded:21Jan2014) to Recorded 3. Calcium/Vitamin D/Minerals TABS;  Therapy: (Recorded:21Jan2014) to Recorded 4. Cephalexin 500 MG Oral Capsule; TAKE 1 CAPSULE EVERY 12 HOURS DAILY;  Therapy: [id number] to (Evaluate:26Jan2017)  Requested for: [id number]; Last  Rx:19Jan2017 Ordered 5. Clopidogrel Bisulfate 75 MG Oral Tablet;  Therapy: [phone] to Recorded 6. Crestor 20 MG Oral Tablet;  Therapy: 15Sep2013 to Recorded 7. Ferrous Sulfate 325 (65 Fe) MG Oral Tablet Delayed Release;  Therapy: (Recorded:21Jan2014) to Recorded 8. Furosemide 20 MG Oral Tablet;  Therapy: [id number] to Recorded 9. Hydrocodone-Acetaminophen 5-500 MG Oral Tablet;  Therapy: [id number] to Recorded 10. Levemir FlexPen 100 UNIT/ML SOLN;   Therapy: [id number] to Recorded 11. Magnesium Oxide 400 MG Oral Tablet;   Therapy: (Recorded:21Jan2014) to Recorded 12. Metoprolol Succinate ER 25 MG Oral Tablet Extended Release 24 Hour;   Therapy: [id number] to Recorded 13. NovoLOG FlexPen 100 UNIT/ML SOLN;   Therapy: 30Aug2013 to Recorded 14. Omeprazole 20 MG Oral Capsule Delayed Release;   Therapy: [id number] to Recorded 15. Synthroid 137 MCG Oral Tablet;   Therapy: 26Aug2013 to Recorded 16. Vitamin B-12 TABS;   Therapy: (Recorded:21Jan2014) to Recorded 17. Vitamin C TABS;   Therapy: (Recorded:21Jan2014) to Recorded 18. Vitamin D (Ergocalciferol) 50000 UNIT Oral Capsule;   Therapy: (Recorded:19Jan2017) to Recorded  Allergies Denied  1.  Victoza SOLN  Family History Problems  1. Family history of Diabetes  Mellitus : Mother 2. Family history of Family Health Status Number Of Children : Father   1 daughter 3. Family history of Father Deceased At Age 26 : Father   stroke 4. Family history of Hypertension 5. Family history of Mother Deceased At Age 18 : Father   heart failure 6. Family history of Nephrolithiasis 7. Family history of Transient Ischemic Attack : Mother  Social History Problems  1. Denied: History of Alcohol Use (History) 2. Caffeine Use   16 oz 3. Former smoker [phone]) 4. Marital History - Single 5. Retired From Work 49. Tobacco use (Z72.0)   2 pks a day for 30 yrs /quit 2 yrs ago  Review of Systems The patient states that she feels weak intermittently and has less appetite. She also has lost some weight over the past 3 months.   Vitals Vital Signs [Data Includes: Last 1 Day]  Recorded: [id number] 02:44PM  Height: 5 ft 6 in Weight: 148 lb  BMI Calculated: 23.89 BSA Calculated: 1.76 Blood Pressure: 116 / 81 Temperature: 97.9 F Heart Rate: 82  Results/Data Urine [Data Includes: Last 1 Day]   [id number]  COLOR YELLOW   APPEARANCE TURBID   SPECIFIC GRAVITY    pH TNP   GLUCOSE TNP   BILIRUBIN TNP   KETONE TNP   BLOOD TNP   PROTEIN TNP   NITRITE TNP   LEUKOCYTE ESTERASE TNP   SQUAMOUS EPITHELIAL/HPF 0-5 HPF  WBC PACKED WBC/HPF  RBC 3-10 RBC/HPF  BACTERIA MANY HPF  CRYSTALS NONE SEEN HPF  CASTS NONE SEEN LPF  Yeast NONE SEEN HPF   Assessment Assessed  1. Urinary tract infection (N39.0)  Plan  Health Maintenance  1. UA With REFLEX; [Do Not Release]; Status:Complete;   [id number] 02:21PM Urinary tract infection  2. Follow-up Schedule Surgery Office  Follow-up  Status: Hold For - Appointment   Requested for: [id number]  URINE CULTURE; Status:In Progress - Specimen/Data Collected;  Done: [id number] Perform:[facility]; [id number]; Marked Important; Last Updated [id number], Debbie; 07/17/2015 3:20:46 PM;Ordered; Today;  [id number] tract  infection; Ordered [id number], Marland Kitchen;   Discussion/Summary The patient has done well over the interval although she has had some voiding symptoms. A urine culture was negative but did respond to antibiotics. Our plan at this point is that exchange her stent over the next several weeks. I'll plan to treat her with antibiotics prior to prevent her from developing any infections postop.

## 2015-08-01 NOTE — Anesthesia Postprocedure Evaluation (Signed)
Anesthesia Post Note  Patient: [name] [name]  Procedure(s) Performed: Procedure(s) (LRB): CYSTOSCOPY WITH STENT REPLACEMENT (Right) CYSTOSCOPY WITH RETROGRADE PYELOGRAM (Right)  Patient location during evaluation: PACU Anesthesia Type: MAC Level of consciousness: awake and alert Pain management: pain level controlled Vital Signs Assessment: post-procedure vital signs reviewed and stable Respiratory status: spontaneous breathing, nonlabored ventilation, respiratory function stable and patient connected to nasal cannula oxygen Cardiovascular status: stable and blood pressure returned to baseline Anesthetic complications: no    Last Vitals:  Filed Vitals:   08/01/15 1015 08/01/15 1030  BP: 190/69 178/65  Pulse: 64 65  Temp:    Resp: 15 21    Last Pain:  Filed Vitals:   08/01/15 1032  PainSc: [name] [name]

## 2015-08-01 NOTE — Discharge Instructions (Signed)
DISCHARGE INSTRUCTIONS FOR KIDNEY STONE/URETERAL STENT   MEDICATIONS:  1.  Resume all your other meds from home - except do not take any extra narcotic pain meds that you may have at home.  2. Pyridium is to help with the burning/stinging when you urinate.   ACTIVITY:  1. No strenuous activity x 1week  2. No driving while on narcotic pain medications  3. Drink plenty of water  4. Continue to walk at home - you can still get blood clots when you are at home, so keep active, but don't over do it.  5. May return to work/school tomorrow or when you feel ready   BATHING:  1. You can shower and we recommend daily showers    SIGNS/SYMPTOMS TO CALL:  Please call us if you have a fever greater than 101.5, uncontrolled nausea/vomiting, uncontrolled pain, dizziness, unable to urinate, bloody urine, chest pain, shortness of breath, leg swelling, leg pain, redness around wound, drainage from wound, or any other concerns or questions.   You can reach [place] at [phone].   FOLLOW-UP:  1. You have an appointment in 6 months. Post Anesthesia Home Care Instructions  Activity: Get plenty of rest for the remainder of the day. A responsible adult should stay with you for 24 hours following the procedure.  For the next 24 hours, DO NOT: -Drive a car -Paediatric nurse -Drink alcoholic beverages -Take any medication unless instructed by your physician -Make any legal decisions or sign important papers.  Meals: Start with liquid foods such as gelatin or soup. Progress to regular foods as tolerated. Avoid greasy, spicy, heavy foods. If nausea and/or vomiting occur, drink only clear liquids until the nausea and/or vomiting subsides. Call your physician if vomiting continues.  Special Instructions/Symptoms: Your throat may feel dry or sore from the anesthesia or the breathing tube placed in your throat during surgery. If this causes discomfort, gargle with warm salt water. The discomfort should  disappear within 24 hours.

## 2015-08-01 NOTE — Transfer of Care (Signed)
Immediate Anesthesia Transfer of Care Note  Patient: [name] [name]  Procedure(s) Performed: Procedure(s): CYSTOSCOPY WITH STENT REPLACEMENT (Right) CYSTOSCOPY WITH RETROGRADE PYELOGRAM (Right)  Patient Location: PACU  Anesthesia Type:MAC  Level of Consciousness: awake, alert , oriented and patient cooperative  Airway & Oxygen Therapy: Patient Spontanous Breathing and Patient connected to nasal cannula oxygen  Post-op Assessment: Report given to RN and Post -op Vital signs reviewed and stable  Post vital signs: Reviewed and stable  Last Vitals:  Filed Vitals:   08/01/15 0719  BP: 190/67  Pulse: 73  Temp: 36.5 C  Resp: 16    Last Pain:  Filed Vitals:   08/01/15 0729  PainSc: 4       Patients Stated Pain Goal: 8 [phone])  Complications: No apparent anesthesia complications

## 2015-08-01 NOTE — Op Note (Signed)
Preoperative diagnosis:  1. Retro-perineal fibrosis 2. Right hydronephrosis   Postoperative diagnosis:  1. Same   Procedure: 1. Right retrograde pyelogram with interpretation 2. Right ureteral stent exchange  Surgeon: Ardis Hughs, MD  Anesthesia: General  Complications: None  Intraoperative findings: 10 mL of Cystografin was instilled as a patient's distal ureter and retrograde pyelogram was performed measuring a normal caliber ureter with a dilated collecting system and blunted calyces, unchanged from previous retrogrades.  EBL: Minimal  Specimens: None  Indication: KERIANNE STOKE is a 75 y.o. patient with history of retro-peritoneal fibrosis and associated hydronephrosis. The left kidney is completely obstructed and we were unable to pass a stent or salvage this kidney proximally one year ago. We have been exchanging her right stents every 6 months. She presents today for stent exchange.  After reviewing the management options for treatment, he elected to proceed with the above surgical procedure(s). We have discussed the potential benefits and risks of the procedure, side effects of the proposed treatment, the likelihood of the patient achieving the goals of the procedure, and any potential problems that might occur during the procedure or recuperation. Informed consent has been obtained.  Description of procedure:  The patient was taken to the operating room and general anesthesia was induced.  The patient was placed in the dorsal lithotomy position, prepped and draped in the usual sterile fashion, and preoperative antibiotics were administered. A preoperative time-out was performed.   Then passed a 21 French 30 cystoscope through the patient's urethra and in the bladder under visual guidance. The bladder was then emptied and then refilled slowly and cystoscopic evaluation was performed with no abnormalities. A stent grasper was then used to graft the distal end of the  Polaris stent and pulled to the urethral meatus under fluoroscopic guidance. I then advanced a 0.38 sensor wire through the stent and up into the right renal pelvis removing stent over the wire. I then exchanged the stent for a 6 French open-ended ureteral catheter and performed retrograde pyelogram as demonstrated above. The ureteral catheter was exchanged again for the 0.38 sensor wire and the Polaris stent was then passed over the wire and into the patient's bladder. This was done in the routine fashion. The stent was noted to be well positioned on the fluoroscopic image. The bladder was emptied. A B&O suppository was placed and urethral lidocaine jelly. Next  The patient subsequently expanded and returned to PACU in stable condition.  Ardis Hughs, M.D.

## 2015-08-05 ENCOUNTER — Encounter (HOSPITAL_BASED_OUTPATIENT_CLINIC_OR_DEPARTMENT_OTHER): Payer: Self-pay | Admitting: Urology

## 2015-08-19 ENCOUNTER — Other Ambulatory Visit: Payer: Self-pay | Admitting: Endocrinology

## 2015-08-19 NOTE — Telephone Encounter (Signed)
Ok to refill 

## 2015-08-26 ENCOUNTER — Telehealth: Payer: Self-pay | Admitting: Endocrinology

## 2015-08-26 NOTE — Telephone Encounter (Signed)
Pt has blood work tomorrow can we have the rx for the hydrocodone ready for her tomorrow

## 2015-08-27 ENCOUNTER — Other Ambulatory Visit (INDEPENDENT_AMBULATORY_CARE_PROVIDER_SITE_OTHER): Payer: Medicare Other

## 2015-08-27 DIAGNOSIS — E119 Type 2 diabetes mellitus without complications: Secondary | ICD-10-CM | POA: Diagnosis not present

## 2015-08-27 DIAGNOSIS — D649 Anemia, unspecified: Secondary | ICD-10-CM | POA: Diagnosis not present

## 2015-08-27 DIAGNOSIS — E063 Autoimmune thyroiditis: Secondary | ICD-10-CM

## 2015-08-27 DIAGNOSIS — E038 Other specified hypothyroidism: Secondary | ICD-10-CM | POA: Diagnosis not present

## 2015-08-27 LAB — COMPREHENSIVE METABOLIC PANEL
ALBUMIN: 2.7 g/dL — AB (ref 3.5–5.2)
ALT: 154 U/L — ABNORMAL HIGH (ref 0–35)
AST: 96 U/L — ABNORMAL HIGH (ref 0–37)
Alkaline Phosphatase: 337 U/L — ABNORMAL HIGH (ref 39–117)
BUN: 30 mg/dL — AB (ref 6–23)
CHLORIDE: 108 meq/L (ref 96–112)
CO2: 19 mEq/L (ref 19–32)
Calcium: 8.6 mg/dL (ref 8.4–10.5)
Creatinine, Ser: 1.9 mg/dL — ABNORMAL HIGH (ref 0.40–1.20)
GFR: 27.44 mL/min — ABNORMAL LOW (ref >60.00)
GLUCOSE: 110 mg/dL — AB (ref 70–99)
POTASSIUM: 3.5 meq/L (ref 3.5–5.1)
SODIUM: 138 meq/L (ref 135–145)
Total Bilirubin: 0.5 mg/dL (ref 0.2–1.2)
Total Protein: 6.1 g/dL (ref 6.0–8.3)

## 2015-08-27 LAB — CBC WITH DIFFERENTIAL/PLATELET
BASOS PCT: 0.3 % (ref 0.0–3.0)
Basophils Absolute: 0 10*3/uL (ref 0.0–0.1)
EOS PCT: 3.3 % (ref 0.0–5.0)
Eosinophils Absolute: 0.3 10*3/uL (ref 0.0–0.7)
HCT: 26.9 % — ABNORMAL LOW (ref 36.0–46.0)
Lymphocytes Relative: 17.5 % (ref 12.0–46.0)
Lymphs Abs: 1.8 10*3/uL (ref 0.7–4.0)
MCHC: 31.9 g/dL (ref 30.0–36.0)
MCV: 79.7 fl (ref 78.0–100.0)
MONO ABS: 1.2 10*3/uL — AB (ref 0.1–1.0)
Monocytes Relative: 11.9 % (ref 3.0–12.0)
NEUTROS PCT: 67 % (ref 43.0–77.0)
Neutro Abs: 6.8 10*3/uL (ref 1.4–7.7)
Platelets: 407 10*3/uL — ABNORMAL HIGH (ref 150.0–400.0)
RBC: 3.38 Mil/uL — ABNORMAL LOW (ref 3.87–5.11)
RDW: 18.5 % — ABNORMAL HIGH (ref 11.5–15.5)
WBC: 10.2 10*3/uL (ref 4.0–10.5)

## 2015-08-27 LAB — IBC PANEL
IRON: 8 ug/dL — AB (ref 42–145)
SATURATION RATIOS: 4.5 % — AB (ref 20.0–50.0)
TRANSFERRIN: 128 mg/dL — AB (ref 212.0–360.0)

## 2015-08-27 LAB — TSH: TSH: 0.34 u[IU]/mL — AB (ref 0.35–4.50)

## 2015-08-27 MED ORDER — HYDROCODONE-ACETAMINOPHEN 5-325 MG PO TABS
ORAL_TABLET | ORAL | Status: DC
Start: 2015-08-27 — End: 2015-10-01

## 2015-08-27 NOTE — Addendum Note (Signed)
Addended by: Verlin Grills T on: 08/27/2015 08:50 AM   Modules accepted: Orders

## 2015-08-27 NOTE — Telephone Encounter (Signed)
Rx printed, signed and given to the pt on 08/27/2015.

## 2015-08-28 ENCOUNTER — Emergency Department (HOSPITAL_COMMUNITY): Payer: Medicare Other

## 2015-08-28 ENCOUNTER — Ambulatory Visit (INDEPENDENT_AMBULATORY_CARE_PROVIDER_SITE_OTHER): Payer: Medicare Other | Admitting: Endocrinology

## 2015-08-28 ENCOUNTER — Other Ambulatory Visit: Payer: Self-pay

## 2015-08-28 ENCOUNTER — Inpatient Hospital Stay (HOSPITAL_COMMUNITY)
Admission: EM | Admit: 2015-08-28 | Discharge: 2015-09-08 | DRG: 414 | Disposition: A | Discharge status: Home / Self Care | Payer: Medicare Other | Attending: Internal Medicine | Admitting: Internal Medicine

## 2015-08-28 ENCOUNTER — Telehealth: Payer: Self-pay

## 2015-08-28 ENCOUNTER — Inpatient Hospital Stay (HOSPITAL_COMMUNITY): Payer: Medicare Other

## 2015-08-28 ENCOUNTER — Encounter: Payer: Self-pay | Admitting: Endocrinology

## 2015-08-28 ENCOUNTER — Encounter (HOSPITAL_COMMUNITY): Payer: Self-pay

## 2015-08-28 VITALS — BP 118/50 | HR 94

## 2015-08-28 DIAGNOSIS — D631 Anemia in chronic kidney disease: Secondary | ICD-10-CM | POA: Diagnosis present

## 2015-08-28 DIAGNOSIS — E1151 Type 2 diabetes mellitus with diabetic peripheral angiopathy without gangrene: Secondary | ICD-10-CM | POA: Diagnosis not present

## 2015-08-28 DIAGNOSIS — E663 Overweight: Secondary | ICD-10-CM | POA: Diagnosis present

## 2015-08-28 DIAGNOSIS — K8070 Calculus of gallbladder and bile duct without cholecystitis without obstruction: Secondary | ICD-10-CM | POA: Diagnosis not present

## 2015-08-28 DIAGNOSIS — Z79899 Other long term (current) drug therapy: Secondary | ICD-10-CM | POA: Diagnosis not present

## 2015-08-28 DIAGNOSIS — R208 Other disturbances of skin sensation: Secondary | ICD-10-CM | POA: Diagnosis not present

## 2015-08-28 DIAGNOSIS — N390 Urinary tract infection, site not specified: Secondary | ICD-10-CM | POA: Diagnosis present

## 2015-08-28 DIAGNOSIS — I255 Ischemic cardiomyopathy: Secondary | ICD-10-CM | POA: Diagnosis present

## 2015-08-28 DIAGNOSIS — R1011 Right upper quadrant pain: Secondary | ICD-10-CM | POA: Diagnosis not present

## 2015-08-28 DIAGNOSIS — R29898 Other symptoms and signs involving the musculoskeletal system: Secondary | ICD-10-CM

## 2015-08-28 DIAGNOSIS — N179 Acute kidney failure, unspecified: Secondary | ICD-10-CM | POA: Diagnosis not present

## 2015-08-28 DIAGNOSIS — N184 Chronic kidney disease, stage 4 (severe): Secondary | ICD-10-CM | POA: Diagnosis not present

## 2015-08-28 DIAGNOSIS — I2510 Atherosclerotic heart disease of native coronary artery without angina pectoris: Secondary | ICD-10-CM | POA: Diagnosis present

## 2015-08-28 DIAGNOSIS — M4854XA Collapsed vertebra, not elsewhere classified, thoracic region, initial encounter for fracture: Secondary | ICD-10-CM | POA: Diagnosis not present

## 2015-08-28 DIAGNOSIS — I714 Abdominal aortic aneurysm, without rupture: Secondary | ICD-10-CM | POA: Diagnosis present

## 2015-08-28 DIAGNOSIS — R110 Nausea: Secondary | ICD-10-CM | POA: Insufficient documentation

## 2015-08-28 DIAGNOSIS — Z7902 Long term (current) use of antithrombotics/antiplatelets: Secondary | ICD-10-CM

## 2015-08-28 DIAGNOSIS — M5124 Other intervertebral disc displacement, thoracic region: Secondary | ICD-10-CM | POA: Diagnosis not present

## 2015-08-28 DIAGNOSIS — E43 Unspecified severe protein-calorie malnutrition: Secondary | ICD-10-CM | POA: Diagnosis present

## 2015-08-28 DIAGNOSIS — Z01810 Encounter for preprocedural cardiovascular examination: Secondary | ICD-10-CM

## 2015-08-28 DIAGNOSIS — R0602 Shortness of breath: Secondary | ICD-10-CM | POA: Diagnosis not present

## 2015-08-28 DIAGNOSIS — Z6821 Body mass index (BMI) 21.0-21.9, adult: Secondary | ICD-10-CM

## 2015-08-28 DIAGNOSIS — E785 Hyperlipidemia, unspecified: Secondary | ICD-10-CM | POA: Diagnosis present

## 2015-08-28 DIAGNOSIS — R918 Other nonspecific abnormal finding of lung field: Secondary | ICD-10-CM | POA: Diagnosis not present

## 2015-08-28 DIAGNOSIS — R911 Solitary pulmonary nodule: Secondary | ICD-10-CM | POA: Diagnosis present

## 2015-08-28 DIAGNOSIS — K8010 Calculus of gallbladder with chronic cholecystitis without obstruction: Secondary | ICD-10-CM | POA: Diagnosis not present

## 2015-08-28 DIAGNOSIS — K219 Gastro-esophageal reflux disease without esophagitis: Secondary | ICD-10-CM | POA: Diagnosis not present

## 2015-08-28 DIAGNOSIS — I509 Heart failure, unspecified: Secondary | ICD-10-CM | POA: Diagnosis not present

## 2015-08-28 DIAGNOSIS — I739 Peripheral vascular disease, unspecified: Secondary | ICD-10-CM | POA: Diagnosis present

## 2015-08-28 DIAGNOSIS — D649 Anemia, unspecified: Secondary | ICD-10-CM

## 2015-08-28 DIAGNOSIS — R270 Ataxia, unspecified: Secondary | ICD-10-CM | POA: Diagnosis not present

## 2015-08-28 DIAGNOSIS — I70209 Unspecified atherosclerosis of native arteries of extremities, unspecified extremity: Secondary | ICD-10-CM | POA: Diagnosis present

## 2015-08-28 DIAGNOSIS — E1122 Type 2 diabetes mellitus with diabetic chronic kidney disease: Secondary | ICD-10-CM | POA: Diagnosis not present

## 2015-08-28 DIAGNOSIS — R0600 Dyspnea, unspecified: Secondary | ICD-10-CM

## 2015-08-28 DIAGNOSIS — G8929 Other chronic pain: Secondary | ICD-10-CM | POA: Diagnosis present

## 2015-08-28 DIAGNOSIS — R404 Transient alteration of awareness: Secondary | ICD-10-CM | POA: Diagnosis not present

## 2015-08-28 DIAGNOSIS — M4806 Spinal stenosis, lumbar region: Secondary | ICD-10-CM | POA: Diagnosis present

## 2015-08-28 DIAGNOSIS — R7989 Other specified abnormal findings of blood chemistry: Secondary | ICD-10-CM | POA: Diagnosis present

## 2015-08-28 DIAGNOSIS — K8020 Calculus of gallbladder without cholecystitis without obstruction: Secondary | ICD-10-CM | POA: Diagnosis not present

## 2015-08-28 DIAGNOSIS — K449 Diaphragmatic hernia without obstruction or gangrene: Secondary | ICD-10-CM | POA: Diagnosis not present

## 2015-08-28 DIAGNOSIS — K210 Gastro-esophageal reflux disease with esophagitis: Secondary | ICD-10-CM | POA: Diagnosis present

## 2015-08-28 DIAGNOSIS — Z8249 Family history of ischemic heart disease and other diseases of the circulatory system: Secondary | ICD-10-CM

## 2015-08-28 DIAGNOSIS — E118 Type 2 diabetes mellitus with unspecified complications: Secondary | ICD-10-CM | POA: Diagnosis not present

## 2015-08-28 DIAGNOSIS — Z833 Family history of diabetes mellitus: Secondary | ICD-10-CM

## 2015-08-28 DIAGNOSIS — K8011 Calculus of gallbladder with chronic cholecystitis with obstruction: Secondary | ICD-10-CM | POA: Diagnosis not present

## 2015-08-28 DIAGNOSIS — E1121 Type 2 diabetes mellitus with diabetic nephropathy: Secondary | ICD-10-CM | POA: Diagnosis present

## 2015-08-28 DIAGNOSIS — E875 Hyperkalemia: Secondary | ICD-10-CM | POA: Diagnosis present

## 2015-08-28 DIAGNOSIS — Z7982 Long term (current) use of aspirin: Secondary | ICD-10-CM | POA: Diagnosis not present

## 2015-08-28 DIAGNOSIS — K5900 Constipation, unspecified: Secondary | ICD-10-CM | POA: Diagnosis present

## 2015-08-28 DIAGNOSIS — K8050 Calculus of bile duct without cholangitis or cholecystitis without obstruction: Secondary | ICD-10-CM | POA: Insufficient documentation

## 2015-08-28 DIAGNOSIS — I959 Hypotension, unspecified: Secondary | ICD-10-CM | POA: Diagnosis not present

## 2015-08-28 DIAGNOSIS — N289 Disorder of kidney and ureter, unspecified: Secondary | ICD-10-CM | POA: Diagnosis not present

## 2015-08-28 DIAGNOSIS — R52 Pain, unspecified: Secondary | ICD-10-CM

## 2015-08-28 DIAGNOSIS — K8060 Calculus of gallbladder and bile duct with cholecystitis, unspecified, without obstruction: Secondary | ICD-10-CM | POA: Diagnosis not present

## 2015-08-28 DIAGNOSIS — F5000 Anorexia nervosa, unspecified: Secondary | ICD-10-CM | POA: Diagnosis not present

## 2015-08-28 DIAGNOSIS — E119 Type 2 diabetes mellitus without complications: Secondary | ICD-10-CM | POA: Diagnosis not present

## 2015-08-28 DIAGNOSIS — Z87891 Personal history of nicotine dependence: Secondary | ICD-10-CM

## 2015-08-28 DIAGNOSIS — Z951 Presence of aortocoronary bypass graft: Secondary | ICD-10-CM | POA: Diagnosis not present

## 2015-08-28 DIAGNOSIS — R8299 Other abnormal findings in urine: Secondary | ICD-10-CM | POA: Diagnosis not present

## 2015-08-28 DIAGNOSIS — I129 Hypertensive chronic kidney disease with stage 1 through stage 4 chronic kidney disease, or unspecified chronic kidney disease: Secondary | ICD-10-CM | POA: Diagnosis present

## 2015-08-28 DIAGNOSIS — E11649 Type 2 diabetes mellitus with hypoglycemia without coma: Secondary | ICD-10-CM

## 2015-08-28 DIAGNOSIS — R112 Nausea with vomiting, unspecified: Secondary | ICD-10-CM | POA: Diagnosis not present

## 2015-08-28 DIAGNOSIS — I779 Disorder of arteries and arterioles, unspecified: Secondary | ICD-10-CM | POA: Diagnosis present

## 2015-08-28 DIAGNOSIS — Z794 Long term (current) use of insulin: Secondary | ICD-10-CM | POA: Diagnosis not present

## 2015-08-28 DIAGNOSIS — L8990 Pressure ulcer of unspecified site, unspecified stage: Secondary | ICD-10-CM | POA: Insufficient documentation

## 2015-08-28 DIAGNOSIS — I2583 Coronary atherosclerosis due to lipid rich plaque: Secondary | ICD-10-CM | POA: Diagnosis present

## 2015-08-28 DIAGNOSIS — K838 Other specified diseases of biliary tract: Secondary | ICD-10-CM | POA: Diagnosis not present

## 2015-08-28 DIAGNOSIS — E039 Hypothyroidism, unspecified: Secondary | ICD-10-CM | POA: Diagnosis present

## 2015-08-28 DIAGNOSIS — K7689 Other specified diseases of liver: Secondary | ICD-10-CM | POA: Diagnosis not present

## 2015-08-28 DIAGNOSIS — I6523 Occlusion and stenosis of bilateral carotid arteries: Secondary | ICD-10-CM | POA: Diagnosis not present

## 2015-08-28 DIAGNOSIS — R531 Weakness: Secondary | ICD-10-CM

## 2015-08-28 DIAGNOSIS — R630 Anorexia: Secondary | ICD-10-CM | POA: Diagnosis not present

## 2015-08-28 DIAGNOSIS — I9589 Other hypotension: Secondary | ICD-10-CM | POA: Diagnosis not present

## 2015-08-28 DIAGNOSIS — M546 Pain in thoracic spine: Secondary | ICD-10-CM | POA: Diagnosis not present

## 2015-08-28 DIAGNOSIS — R945 Abnormal results of liver function studies: Secondary | ICD-10-CM | POA: Diagnosis not present

## 2015-08-28 DIAGNOSIS — Z419 Encounter for procedure for purposes other than remedying health state, unspecified: Secondary | ICD-10-CM

## 2015-08-28 DIAGNOSIS — I950 Idiopathic hypotension: Secondary | ICD-10-CM | POA: Diagnosis not present

## 2015-08-28 DIAGNOSIS — R55 Syncope and collapse: Secondary | ICD-10-CM | POA: Diagnosis not present

## 2015-08-28 LAB — I-STAT CHEM 8, ED
BUN: 29 mg/dL — AB (ref 6–20)
CALCIUM ION: 1.14 mmol/L (ref 1.13–1.30)
CREATININE: 2.3 mg/dL — AB (ref 0.44–1.00)
Chloride: 110 mmol/L (ref 101–111)
GLUCOSE: 110 mg/dL — AB (ref 65–99)
HCT: 26 % — ABNORMAL LOW (ref 36.0–46.0)
Hemoglobin: 8.8 g/dL — ABNORMAL LOW (ref 12.0–15.0)
Potassium: 3.6 mmol/L (ref 3.5–5.1)
Sodium: 140 mmol/L (ref 135–145)
TCO2: 17 mmol/L (ref 0–100)

## 2015-08-28 LAB — CBC
HCT: 26.8 % — ABNORMAL LOW (ref 36.0–46.0)
HEMOGLOBIN: 8.1 g/dL — AB (ref 12.0–15.0)
MCH: 25.5 pg — AB (ref 26.0–34.0)
MCHC: 30.2 g/dL (ref 30.0–36.0)
MCV: 84.3 fL (ref 78.0–100.0)
Platelets: 405 10*3/uL — ABNORMAL HIGH (ref 150–400)
RBC: 3.18 MIL/uL — AB (ref 3.87–5.11)
RDW: 17.5 % — ABNORMAL HIGH (ref 11.5–15.5)
WBC: 12.5 10*3/uL — ABNORMAL HIGH (ref 4.0–10.5)

## 2015-08-28 LAB — PROTIME-INR
INR: 1.4 (ref 0.00–1.49)
Prothrombin Time: 17.3 seconds — ABNORMAL HIGH (ref 11.6–15.2)

## 2015-08-28 LAB — APTT: aPTT: 35 s (ref 24–37)

## 2015-08-28 LAB — URINE MICROSCOPIC-ADD ON

## 2015-08-28 LAB — URINALYSIS, ROUTINE W REFLEX MICROSCOPIC
BILIRUBIN URINE: NEGATIVE
GLUCOSE, UA: NEGATIVE mg/dL
KETONES UR: 15 mg/dL — AB
Nitrite: NEGATIVE
PH: 5.5 (ref 5.0–8.0)
Protein, ur: >300 mg/dL — AB
SPECIFIC GRAVITY, URINE: 1.021 (ref 1.005–1.030)

## 2015-08-28 LAB — DIFFERENTIAL
Basophils Absolute: 0 K/uL (ref 0.0–0.1)
Basophils Relative: 0 %
Eosinophils Absolute: 0 K/uL (ref 0.0–0.7)
Eosinophils Relative: 0 %
Lymphocytes Relative: 8 %
Lymphs Abs: 1 K/uL (ref 0.7–4.0)
Monocytes Absolute: 1.2 K/uL — ABNORMAL HIGH (ref 0.1–1.0)
Monocytes Relative: 9 %
Neutro Abs: 10.2 K/uL — ABNORMAL HIGH (ref 1.7–7.7)
Neutrophils Relative %: 83 %

## 2015-08-28 LAB — COMPREHENSIVE METABOLIC PANEL
ALBUMIN: 2.1 g/dL — AB (ref 3.5–5.0)
ALK PHOS: 457 U/L — AB (ref 38–126)
ALT: 286 U/L — ABNORMAL HIGH (ref 14–54)
ANION GAP: 10 (ref 5–15)
AST: 396 U/L — ABNORMAL HIGH (ref 15–41)
BUN: 32 mg/dL — ABNORMAL HIGH (ref 6–20)
CALCIUM: 8 mg/dL — AB (ref 8.9–10.3)
CO2: 16 mmol/L — AB (ref 22–32)
Chloride: 111 mmol/L (ref 101–111)
Creatinine, Ser: 2.48 mg/dL — ABNORMAL HIGH (ref 0.44–1.00)
GFR calc non Af Amer: 18 mL/min — ABNORMAL LOW (ref >60)
GFR, EST AFRICAN AMERICAN: 21 mL/min — AB (ref >60)
GLUCOSE: 115 mg/dL — AB (ref 65–99)
Potassium: 3.6 mmol/L (ref 3.5–5.1)
Sodium: 137 mmol/L (ref 135–145)
Total Bilirubin: 0.9 mg/dL (ref 0.3–1.2)
Total Protein: 5.8 g/dL — ABNORMAL LOW (ref 6.5–8.1)

## 2015-08-28 LAB — RAPID URINE DRUG SCREEN, HOSP PERFORMED
AMPHETAMINES: NOT DETECTED
BENZODIAZEPINES: NOT DETECTED
Barbiturates: NOT DETECTED
COCAINE: NOT DETECTED
OPIATES: POSITIVE — AB
TETRAHYDROCANNABINOL: NOT DETECTED

## 2015-08-28 LAB — ETHANOL: Alcohol, Ethyl (B): <5 mg/dL (ref <5)

## 2015-08-28 LAB — I-STAT TROPONIN, ED: Troponin i, poc: 0 ng/mL (ref 0.00–0.08)

## 2015-08-28 LAB — POC OCCULT BLOOD, ED: Fecal Occult Bld: NEGATIVE

## 2015-08-28 MED ORDER — SULFAMETHOXAZOLE-TRIMETHOPRIM 800-160 MG PO TABS
1.0000 | ORAL_TABLET | Freq: Once | ORAL | Status: AC
  Administered 2015-08-28: 1 via ORAL
  Filled 2015-08-28: qty 1

## 2015-08-28 MED ORDER — DEXTROSE 5 % IV SOLN
500.0000 mg | Freq: Two times a day (BID) | INTRAVENOUS | Status: DC
  Administered 2015-08-29 – 2015-09-01 (×8): 500 mg via INTRAVENOUS
  Filled 2015-08-28 (×9): qty 5

## 2015-08-28 NOTE — ED Notes (Signed)
Patient transported to MRI 

## 2015-08-28 NOTE — ED Notes (Signed)
Pt arrives EMS from endocrinology office with c/o bilateral lower extremity weakness that has started 2-3 weeks ago. 30 lbs weight loss over last 2 months with hx of diabetes, anemia and back pain.

## 2015-08-28 NOTE — Telephone Encounter (Signed)
Per Dr. Dwyane Dee, pt's recent labs showed several abnormalities. Attempted to reach the pt to schedule an appointment for today at 4 pm, but was unsuccessful. Will attempted to reach the pt at a later time.

## 2015-08-28 NOTE — H&P (Addendum)
TRH H&P   Patient Demographics:    [name] [name], is a 75 y.o. female  MRN: [id number]   DOB - 1940-12-13  Admit Date - 08/28/2015  Outpatient Primary MD for the patient is [name], MD  Referring MD/NP/PA: Angelina Ok  Outpatient Specialists:  Dr. Louis Meckel (urology), Quay Burow (cardiology)  Patient coming from: home  Chief Complaint  Patient presents with  . Weakness      HPI:    [name] [name]  is a 75 y.o. female, w/ Dm2, CAD, AAA, CHF (EF 50-55%), CKD stage 4,  Diverticulosis apparently was at Dr. Ronnie Derby office and could not walk and her bp was low and her liver function was abnormal along with her hemoglobin.  Pt was sent to ER for evaluation of weakness in her bilateral legs , as well as numbness " like my legs went to sleep"  Pt states numbness has disappeared.  Pt has chronic back pain.  Pt denies headache, vision change, dysarthria, fever, chills, n/v, abd pain, diarrhea, brbpr. Pt notes n/v x3 5 days ago.    In ED, pt found to have anemia, hgb=8.6 and mild acute on chronic renal failure.  Pt also had marked liver abnormality, and choledocholithiasis on MRI.  Pt will be admitted for hypotension, choledocholithiasis and abnormal liver function, and acute on chronic renal failure.      Review of systems:    In addition to the HPI above,  No Fever-chills, No Headache, No changes with Vision or hearing, No problems swallowing food or Liquids, No Chest pain, Cough or Shortness of Breath, No Abdominal pain,  Bowel movements are regular, No Blood in stool or Urine, No dysuria, No new skin rashes or bruises, No new joints pains-aches,  No recent weight gain or loss, No polyuria, polydypsia or polyphagia, No significant Mental Stressors.  A full 10 point Review of Systems was done, except as stated above, all other Review of Systems were negative.   With  Past History of the following :    Past Medical History  Diagnosis Date  . CHF (congestive heart failure) ([place])   . Hypertension   . Hyperlipidemia   . Hypothyroidism   . Ischemic cardiomyopathy     03-25-2010-- per lasts echo EF  50-55%  . Coronary artery disease due to lipid rich plaque cardiologist-  dr berry    s/p CABG x6 1997-- cath 12-09-2009 occluded vein to obtuse marginal branch and ramus branch with patent vien to PDA and patent LIMA to LAD, ef 40%-- Myoview 11-24-2011, nonischemic  . Type 2 diabetes mellitus ([place])     monitored by dr Dwyane Dee  . Retroperitoneal fibrosis   . Bilateral hydronephrosis   . CKD (chronic kidney disease), stage III   . History of sepsis     10-18-2014 w/ acute pyelonephritis  . Dyspnea on exertion   . GERD (gastroesophageal reflux disease)   .  PVD (peripheral vascular disease) with claudication ([place])     last duplex 07-04-2015 -- Right CCA and ICA chronic occlusion, [id number] LICA, Patent vertebral arteries w/ antegrade flow, bilateral normal subclavian arteries   . Carotid artery stenosis     carotid doppler 06/2012 - Right CCA/Bulb/ICA with chronic occlusion; L vertebral artery with abnormal blood flow; L Bulb/Prox ICA  s/p endarterectomy with mild fibrous plaque, 50% diameter reduction  . PAD (peripheral artery disease) ([place])     09/2010 LEAs - R ABI of 0.45, occluded fem-pop bypass graft, L ABI of 0.59 with occluded SFA; severe arterial insuff      Past Surgical History  Procedure Laterality Date  . Cystoscopy w/ ureteral stent placement  03/10/2012    Procedure: CYSTOSCOPY WITH RETROGRADE PYELOGRAM/URETERAL STENT PLACEMENT;  Surgeon: Hanley Ben, MD;  Location: [facility];  Service: Urology;  Laterality: Left;  . Carotid endarterectomy Bilateral right 1994//  left ?  Marland Kitchen Transthoracic echocardiogram  03/25/2010    EF 99991111, LV systolic function low normal with mild inferoseptal hypocontractility; LA mildly dilated; mod MR; mild TR, RV systolic  pressure elevated, mild pulm HTN; AV mildly sclerotic; mild pulm valve regurg; aortic root sclerosis/calcif   . Coronary artery bypass graft  1997    x6; internal mammary to LAD, SVG to ramus #1 & #2, SVG to OM, SVG to PDA,   . Cardiac catheterization  12-09-2009  dr al little    EF >40%-- occluded vein to OM & ramus branches, patent vein to PDA, patent LIMA to LAD (Dr. [name], [facility]) - later had thrombectomy of R fem-pop bypass ad R common femoral & profunda femoris artery (Dr. Oneida Alar)  . Repair right femoral pseudoaneuysm/  right fem-pop bypass graft/  debridement right lower extremitiy venous status ulcers x2  01-12-2005  . Aorta - bilateral femoral artery bypass graft  1997    and RIGHT FEM-POP   . Cardiovascular stress test  11-24-2011   dr berry    Low Risk study: fixed basal to mid inferior attenuation artifact, no reversible ischemia,  normal LV function and wall motion , ef 67%  . Total abdominal hysterectomy w/ bilateral salpingoophorectomy  1986  . Cataract extraction w/ intraocular lens  implant, bilateral  2006  . Cystoscopy w/ ureteral stent placement Bilateral 03/07/2015    Procedure: BILATERAL RETROGRADE PYELOGRAM AND RIGHT URETERAL STENT PLACEMENT;  Surgeon: Ardis Hughs, MD;  Location: [facility];  Service: Urology;  Laterality: Bilateral;  . Cystoscopy w/ ureteral stent placement Right 08/01/2015    Procedure: CYSTOSCOPY WITH STENT REPLACEMENT;  Surgeon: Ardis Hughs, MD;  Location: [facility];  Service: Urology;  Laterality: Right;  . Cystoscopy w/ retrogrades Right 08/01/2015    Procedure: CYSTOSCOPY WITH RETROGRADE PYELOGRAM;  Surgeon: Ardis Hughs, MD;  Location: [facility];  Service: Urology;  Laterality: Right;      Social History:     Social History  Substance Use Topics  . Smoking status: Former Smoker -- 2.00 packs/day for 30 years    Quit date: 06/07/2009  . Smokeless tobacco: Never Used  .  Alcohol Use: 1.8 oz/week    3 Standard drinks or equivalent per week     Comment: occasuional     Lives - at home by self  Mobility -     Family History :     Family History  Problem Relation Age of Onset  . Congestive Heart Failure Mother   . Diabetes Mother   .  Stroke Father   . Cancer Maternal Aunt     Breast cancer      Home Medications:   Prior to Admission medications   Medication Sig Start Date End Date Taking? Authorizing Provider  albuterol (PROVENTIL HFA;VENTOLIN HFA) 108 (90 BASE) MCG/ACT inhaler Inhale 2 puffs into the lungs every 6 (six) hours as needed for wheezing. 02/22/14  Yes [name], MD  aspirin 81 MG chewable tablet Chew 81 mg by mouth at bedtime.    Yes Historical Provider, MD  calcium-vitamin D (OSCAL WITH D) 500-200 MG-UNIT per tablet Take 1 tablet by mouth every morning.   Yes Historical Provider, MD  clopidogrel (PLAVIX) 75 MG tablet TAKE 1 TABLET EVERY MORNING 06/30/15  Yes [name], MD  Cranberry 250 MG TABS Take 1 tablet by mouth daily.    Yes Historical Provider, MD  CRESTOR 20 MG tablet take 1 tablet by mouth once daily 06/05/15  Yes [name], MD  ferrous sulfate 325 (65 FE) MG tablet Take 325 mg by mouth 3 (three) times a week.    Yes Historical Provider, MD  HYDROcodone-acetaminophen (NORCO/VICODIN) 5-325 MG tablet Take 1 tablet every 6 hours as needed for back pain 08/27/15  Yes [name], MD  insulin aspart (NOVOLOG) 100 UNIT/ML FlexPen Inject 8 units three times a day with meals 09/30/14  Yes [name], MD  Insulin Detemir (LEVEMIR FLEXTOUCH) 100 UNIT/ML Pen Inject 22 units at bedtime. Patient taking differently: Inject 18 Units into the skin at bedtime. Inject 20 units at bedtime. 01/13/15  Yes [name], MD  lactobacillus acidophilus (BACID) TABS tablet Take 2 tablets by mouth 3 (three) times daily.   Yes Historical Provider, MD  latanoprost (XALATAN) 0.005 % ophthalmic solution Place 1 drop into both eyes at bedtime. 03/11/14  Yes  Historical Provider, MD  levothyroxine (SYNTHROID, LEVOTHROID) 125 MCG tablet TAKE 1 TABLET DAILY Patient taking differently: TAKE 1 TABLET DAILY---  takes in am takes only 1/2 tablet on Saturday 02/12/15  Yes [name], MD  metoprolol succinate (TOPROL-XL) 25 MG 24 hr tablet TAKE 1/2 TABLET (=12.5MG )  EVERY MORNING 06/24/15  Yes [name], MD  omeprazole (PRILOSEC) 20 MG capsule TAKE 1 CAPSULE DAILY Patient taking differently: TAKE 1 CAPSULE DAILY--  takes in am 02/12/15  Yes [name], MD  vitamin C (ASCORBIC ACID) 500 MG tablet Take 500 mg by mouth every morning.   Yes Historical Provider, MD  Vitamin D, Ergocalciferol, (DRISDOL) 50000 units CAPS capsule TAKE 1 CAPSULE BY MOUTH EVERY 7 DAYS ON THURSDAYS 08/19/15  Yes [name], MD  furosemide (LASIX) 20 MG tablet take 1 tablet by mouth once daily Patient taking differently: take 1 tablet by mouth once prn 12/25/14   [name], MD  metoCLOPramide (REGLAN) 5 MG tablet Take 1 tablet (5 mg total) by mouth 4 (four) times daily. 07/03/15   [name], MD  phenazopyridine (PYRIDIUM) 200 MG tablet Take 1 tablet (200 mg total) by mouth 3 (three) times daily as needed for pain. 08/01/15   Ardis Hughs, MD     Allergies:    No Known Allergies   Physical Exam:   Vitals  Blood pressure 136/64, pulse 72, temperature 97.5 F (36.4 C), temperature source Oral, resp. rate 25, SpO2 99 %.   1. General  lying in bed in NAD,   2. Normal affect and insight, Not Suicidal or Homicidal, Awake Alert, Oriented X 3.  3. No F.N deficits, ALL C.Nerves Intact, Strength 5/5 all 4 extremities,  Sensation intact all 4 extremities, Plantars down going.  4. Ears and Eyes appear Normal, anicteric, Conjunctivae clear, PERRLA. Moist Oral Mucosa.  5. Supple Neck, No JVD, No cervical lymphadenopathy appriciated, No Carotid Bruits.  6. Symmetrical Chest wall movement, Good air movement bilaterally, CTAB.  7. Midline scar,  RRR, No Gallops, Rubs or Murmurs, No  Parasternal Heave.  8. Positive Bowel Sounds, Abdomen Soft, No tenderness, No organomegaly appriciated,No rebound -guarding or rigidity.  9.  No Cyanosis, Normal Skin Turgor, No Skin Rash or Bruise.  10. Good muscle tone,  joints appear normal , no effusions, Normal ROM.  11. No Palpable Lymph Nodes in Neck or Axillae  No spider, no palmar erythema, no asterixis     Data Review:    CBC  Recent Labs Lab 08/27/15 0841 08/28/15 1825 08/28/15 1834  WBC 10.2 12.5*  --   HGB 8.6 Repeated and verified X2.* 8.1* 8.8*  HCT 26.9* 26.8* 26.0*  PLT 407.0* 405*  --   MCV 79.7 84.3  --   MCH  --  25.5*  --   MCHC 31.9 30.2  --   RDW 18.5* 17.5*  --   LYMPHSABS 1.8 1.0  --   MONOABS 1.2* 1.2*  --   EOSABS 0.3 0.0  --   BASOSABS 0.0 0.0  --    ------------------------------------------------------------------------------------------------------------------  Chemistries   Recent Labs Lab 08/27/15 0841 08/28/15 1825 08/28/15 1834  NA 138 137 140  K 3.5 3.6 3.6  CL 108 111 110  CO2 19 16*  --   GLUCOSE 110* 115* 110*  BUN 30* 32* 29*  CREATININE 1.90* 2.48* 2.30*  CALCIUM 8.6 8.0*  --   AST 96* 396*  --   ALT 154* 286*  --   ALKPHOS 337* 457*  --   BILITOT 0.5 0.9  --    ------------------------------------------------------------------------------------------------------------------ estimated creatinine clearance is 20.9 mL/min (by C-G formula based on Cr of 2.3). ------------------------------------------------------------------------------------------------------------------  Recent Labs  08/27/15 0841  TSH 0.34*    Coagulation profile  Recent Labs Lab 08/28/15 1825  INR 1.40   ------------------------------------------------------------------------------------------------------------------- No results for input(s): DDIMER in the last 72  hours. -------------------------------------------------------------------------------------------------------------------  Cardiac Enzymes No results for input(s): CKMB, TROPONINI, MYOGLOBIN in the last 168 hours.  Invalid input(s): CK ------------------------------------------------------------------------------------------------------------------ No results found for: BNP   ---------------------------------------------------------------------------------------------------------------  Urinalysis    Component Value Date/Time   COLORURINE YELLOW 08/28/2015 1812   APPEARANCEUR TURBID* 08/28/2015 1812   LABSPEC 1.021 08/28/2015 1812   PHURINE 5.5 08/28/2015 1812   GLUCOSEU NEGATIVE 08/28/2015 1812   GLUCOSEU NEGATIVE 07/03/2015 1352   HGBUR LARGE* 08/28/2015 1812   BILIRUBINUR NEGATIVE 08/28/2015 1812   KETONESUR 15* 08/28/2015 1812   PROTEINUR >300* 08/28/2015 1812   UROBILINOGEN 0.2 07/03/2015 1352   NITRITE NEGATIVE 08/28/2015 1812   LEUKOCYTESUR LARGE* 08/28/2015 1812    ----------------------------------------------------------------------------------------------------------------   Imaging Results:    Mr Thoracic Spine Wo Contrast  08/28/2015  CLINICAL DATA:  75 year old female with 3 weeks of bilateral lower extremity weakness. Ataxia. Unintentional weight loss for 2 months. Anemia. Initial encounter. EXAM: MRI THORACIC SPINE WITHOUT CONTRAST TECHNIQUE: Multiplanar, multisequence MR imaging of the thoracic spine was performed. No intravenous contrast was administered. COMPARISON:  Chest radiographs 04/06/2013. Lumbar MRI from today reported separately. CT Abdomen and Pelvis 03/08/2012. FINDINGS: Limited sagittal imaging of the cervical spine suggests widespread advanced cervical spine disc and endplate degeneration (series 3, image 7). There is evidence of associated spinal stenosis, perhaps maximal at C3-C4. There is  severe disc space loss at C7-T1 associated with mild  spondylolisthesis and facet hypertrophy. No significant spinal stenosis results at this level. There is moderate to severe left C8 foraminal stenosis. There is a chronic mild to moderate T12 compression fracture. Elsewhere thoracic vertebral height and alignment is preserved. No marrow edema or evidence of acute osseous abnormality. The thoracic spinal canal is capacious. There are occasional small thoracic disc protrusions. At T11-T12 there is disc osteophyte complex - in part related to the mild T12 posterior superior endplate retropulsion - in addition to mild posterior element hypertrophy, but no significant thoracic spinal stenosis results. Spinal cord signal is within normal limits at all visualized levels. The conus medullaris occurs below the L1 level, see lumbar comparison from today. There is abnormal increased signal intensity in the left lower lobe (series 8, image 32), a within nonspecific appearance. There is aneurysmal enlargement of the descending thoracic aorta at the T9-T10 level measuring 4.3 cm with mural plaque or thrombus (series 8, image 30). There is also a small right lung pulmonary nodule on image 26. Negative visualized upper abdominal viscera. IMPRESSION: 1. No thoracic spinal stenosis, despite a chronic T12 compression fracture. Very mild for age thoracic spine degenerative changes. 2. Advanced cervical spine degeneration suspected with probable cervical spinal stenosis. Cervical spine MRI would evaluate further. 3. 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 The descending thoracic aortic aneurysm diameters estimated at 4.3 cm on this exam Electronically Signed   By: Genevie Ann M.D.   On: 08/28/2015 20:23   Mr Lumbar Spine Wo Contrast  08/28/2015  CLINICAL DATA:  75 year old  female with 3 weeks of bilateral lower extremity weakness. Ataxia. Unintentional weight loss for 2 months. Anemia. Initial encounter. EXAM: MRI LUMBAR SPINE WITHOUT CONTRAST TECHNIQUE: Multiplanar, multisequence MR imaging of the lumbar spine was performed. No intravenous contrast was administered. COMPARISON:  Thoracic spine MRI from today reported separately. [facility] retrograde pyelogram 08/01/2015. Renal ultrasound 01/09/2015. CT Abdomen and Pelvis 03/08/2012 FINDINGS: Segmentation:  Normal Alignment: Chronic T12 compression fracture unchanged since 2014. Lumbar vertebral height and alignment is stable. There is mild retrolisthesis at L2-L3. Vertebrae: No marrow edema or evidence of acute osseous abnormality. Conus medullaris: Extends to the L1-L2 and appears normal. No lower thoracic spinal cord signal abnormality. Paraspinal and other soft tissues: Cholelithiasis. Furthermore, the CBD is dilated up to 16 mm diameter and there are low signal filling defects in the distal common bile duct as seen on series 6, image 10. These measure up to 10 mm diameter. The visible gallbladder does not appear inflamed. Chronic severe left renal atrophy, left hydronephrosis, and left hydroureter. Fluid fluid levels in the left renal collecting system. There is evidence of a right ureteral stent. There is mild right and hydroureter which is new since 2014. Diverticulosis of the sigmoid colon is partially visible in the pelvis. Disc levels: Mild lumbar spine degeneration above the L4 vertebral level. No significant lumbar spinal stenosis at those levels. Also, there are no significant degenerative changes at L5-S1. L4-L5: Circumferential disc bulge with superimposed right paracentral broad-based disc protrusion. Superimposed moderate facet and ligament flavum hypertrophy with a blunted and facet joint fluid. Moderate spinal stenosis (series 6, image 26). Mild right greater than left L4 foraminal stenosis.  IMPRESSION: 1. Cholelithiasis AND Choledocholithiasis with dilated CBD up  to 16 mm. See series 6, images 4 through 13. Recommend Gastroenterology consultation. 2. Fairly mild for age lumbar spine degeneration. Isolated moderate lumbar spinal stenosis at L4-L5 related to disc and posterior element degeneration. No acute osseous abnormality. 3. 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 There appears to be a right side ureteral stent in place. 4. See also thoracic spine MRI findings from today reported separately. Electronically Signed   By: Genevie Ann M.D.   On: 08/28/2015 20:39       Assessment & Plan:    Active Problems:   UTI (lower urinary tract infection)   Diabetes mellitus, type II (HCC)   Elevated LFTs   Hypotension    1. Hypotension secondary to opioid use with impaired renal function ? ddx uti/ sepsis vs ischemic heart disease Tele Trop i q6h x3 Check cortisol Check cardiac echo Hydrate with ns iv gently due to hx of CHF  2. Uti Ancef iv  3. Abnormal lft ? From shock liver Check cpk , acute hepatitis panel,  Check tsh, check ggt Check abdominal ultrasound Consider ferritin, iron, tibc, ceruloplasmin, alpha 1 antitrypsin, ana, anti smooth muscle ab, celiac panel, ama  4. Dm2 fsbs q4h, iss  5. Anemia Check cbc in am Consider b12, folate, ferritin, tsh, spep, upep  6. Lung nodule RUL  Check CT chest noncontrast  7. CHF (EF ? 50) Hold lasix due to hypotension  8. CKD stage4 D/c bactrim as this could worsen renal function Avoid nsaids  9.  Weakness Check MRI brain r/o CVA  DVT Prophylaxis  SCDs  AM Labs Ordered, also please review Full Orders  Family Communication: Admission, patients condition and plan of care including tests being ordered have been discussed with the patient  who indicate understanding and agree with the plan and Code Status.  Code Status FULL CODE  Likely DC to    Condition CRITICAL    Consults called: d/w Dr.  Pincus Sanes from GI, will assess in the am  Admission status: inpatient  Time spent in minutes : 110min critical care time   Jani Gravel M.D on 08/28/2015 at 10:57 PM  Between 7am to 7pm - Pager - [phone]. After 7pm go to [email or web address] - password [facility]  [facility] - Office  [phone]

## 2015-08-28 NOTE — Consult Note (Signed)
Admission H&P    Chief Complaint: Transient severe back pain with weakness of lower extremities.  HPI: [name] [name] is an 75 y.o. female with a history of CHF, hypertension, hyperlipidemia, ischemic cardiomyopathy, coronary artery disease and peripheral arterial disease who came to the emergency room from her memory physicians office with a complaint of loss of ability to walk and weakness of lower extremities as well as severe back pain. Patient had no difficulty when she left home for her doctor's appointment and drove to the medical office building. On the way in from the parking lot she experienced increasing severe back pain as well as increasing difficulty with walking he describes as weakness of her legs. She also had numbness of the thighs. Symptoms resolved after arriving in the emergency room. Examination in ED showed no lower extremity weakness nor upper extremity weakness. She also had stable gait with ambulation. Back pain had subsided. She had no speech changes and no facial droop. She had no upper extremity weakness no numbness. MRI of the thoracic spine showed no evidence of myelopathy. Degenerative changes were noted as well as compression fracture at T12. Lumbosacral MRI showed moderate spinal stenosis with disc as well as arthritic changes at the L4 level. No evidence of severe stenosis was noted. Patient has not had a recurrence of severe pain or weakness of lower extremities since arriving in the emergency room.  Past Medical History  Diagnosis Date  . CHF (congestive heart failure) ([place])   . Hypertension   . Hyperlipidemia   . Hypothyroidism   . Ischemic cardiomyopathy     03-25-2010-- per lasts echo EF  50-55%  . Coronary artery disease due to lipid rich plaque cardiologist-  dr berry    s/p CABG x6 1997-- cath 12-09-2009 occluded vein to obtuse marginal branch and ramus branch with patent vien to PDA and patent LIMA to LAD, ef 40%-- Myoview 11-24-2011, nonischemic  . Type  2 diabetes mellitus ([place])     monitored by dr Dwyane Dee  . Retroperitoneal fibrosis   . Bilateral hydronephrosis   . CKD (chronic kidney disease), stage III   . History of sepsis     10-18-2014 w/ acute pyelonephritis  . Dyspnea on exertion   . GERD (gastroesophageal reflux disease)   . PVD (peripheral vascular disease) with claudication ([place])     last duplex 07-04-2015 -- Right CCA and ICA chronic occlusion, 85-63% LICA, Patent vertebral arteries w/ antegrade flow, bilateral normal subclavian arteries   . Carotid artery stenosis     carotid doppler 06/2012 - Right CCA/Bulb/ICA with chronic occlusion; L vertebral artery with abnormal blood flow; L Bulb/Prox ICA  s/p endarterectomy with mild fibrous plaque, 50% diameter reduction  . PAD (peripheral artery disease) ([place])     09/2010 LEAs - R ABI of 0.45, occluded fem-pop bypass graft, L ABI of 0.59 with occluded SFA; severe arterial insuff    Past Surgical History  Procedure Laterality Date  . Cystoscopy w/ ureteral stent placement  03/10/2012    Procedure: CYSTOSCOPY WITH RETROGRADE PYELOGRAM/URETERAL STENT PLACEMENT;  Surgeon: Hanley Ben, MD;  Location: [facility];  Service: Urology;  Laterality: Left;  . Carotid endarterectomy Bilateral right 1994//  left ?  Marland Kitchen Transthoracic echocardiogram  03/25/2010    EF 14-97%, LV systolic function low normal with mild inferoseptal hypocontractility; LA mildly dilated; mod MR; mild TR, RV systolic pressure elevated, mild pulm HTN; AV mildly sclerotic; mild pulm valve regurg; aortic root sclerosis/calcif   . Coronary artery bypass  graft  1997    x6; internal mammary to LAD, SVG to ramus #1 & #2, SVG to OM, SVG to PDA,   . Cardiac catheterization  12-09-2009  dr al little    EF >40%-- occluded vein to OM & ramus branches, patent vein to PDA, patent LIMA to LAD (Dr. [name], [facility]) - later had thrombectomy of R fem-pop bypass ad R common femoral & profunda femoris artery (Dr. Oneida Alar)  . Repair right femoral  pseudoaneuysm/  right fem-pop bypass graft/  debridement right lower extremitiy venous status ulcers x2  01-12-2005  . Aorta - bilateral femoral artery bypass graft  1997    and RIGHT FEM-POP   . Cardiovascular stress test  11-24-2011   dr berry    Low Risk study: fixed basal to mid inferior attenuation artifact, no reversible ischemia,  normal LV function and wall motion , ef 67%  . Total abdominal hysterectomy w/ bilateral salpingoophorectomy  1986  . Cataract extraction w/ intraocular lens  implant, bilateral  2006  . Cystoscopy w/ ureteral stent placement Bilateral 03/07/2015    Procedure: BILATERAL RETROGRADE PYELOGRAM AND RIGHT URETERAL STENT PLACEMENT;  Surgeon: Ardis Hughs, MD;  Location: [facility];  Service: Urology;  Laterality: Bilateral;  . Cystoscopy w/ ureteral stent placement Right 08/01/2015    Procedure: CYSTOSCOPY WITH STENT REPLACEMENT;  Surgeon: Ardis Hughs, MD;  Location: [facility];  Service: Urology;  Laterality: Right;  . Cystoscopy w/ retrogrades Right 08/01/2015    Procedure: CYSTOSCOPY WITH RETROGRADE PYELOGRAM;  Surgeon: Ardis Hughs, MD;  Location: [facility];  Service: Urology;  Laterality: Right;    Family History  Problem Relation Age of Onset  . Congestive Heart Failure Mother   . Diabetes Mother   . Stroke Father   . Cancer Maternal Aunt     Breast cancer   Social History:  reports that she quit smoking about 6 years ago. She has never used smokeless tobacco. She reports that she drinks alcohol. She reports that she does not use illicit drugs.  Allergies: No Known Allergies  Medications: Preadmission medications were reviewed by me.  ROS: History obtained from the patient  General ROS: negative for - chills, fatigue, fever, night sweats, weight gain or weight loss Psychological ROS: negative for - behavioral disorder, hallucinations, memory difficulties, mood swings or suicidal  ideation Ophthalmic ROS: negative for - blurry vision, double vision, eye pain or loss of vision ENT ROS: negative for - epistaxis, nasal discharge, oral lesions, sore throat, tinnitus or vertigo Allergy and Immunology ROS: negative for - hives or itchy/watery eyes Hematological and Lymphatic ROS: negative for - bleeding problems, bruising or swollen lymph nodes Endocrine ROS: negative for - galactorrhea, hair pattern changes, polydipsia/polyuria or temperature intolerance Respiratory ROS: negative for - cough, hemoptysis, shortness of breath or wheezing Cardiovascular ROS: negative for - chest pain, dyspnea on exertion, edema or irregular heartbeat Gastrointestinal ROS: negative for - abdominal pain, diarrhea, hematemesis, nausea/vomiting or stool incontinence Genito-Urinary ROS: negative for - dysuria, hematuria, incontinence or urinary frequency/urgency Musculoskeletal ROS: Chronic low back pain Neurological ROS: as noted in HPI Dermatological ROS: negative for rash and skin lesion changes  Physical Examination: Blood pressure 98/48, pulse 72, temperature 97.5 F (36.4 C), temperature source Oral, resp. rate 16, SpO2 99 %.  HEENT-  Normocephalic, no lesions, without obvious abnormality.  Normal external eye and conjunctiva.  Normal TM's bilaterally.  Normal auditory canals and external ears. Normal external nose, mucus membranes  and septum.  Normal pharynx. Neck supple with no masses, nodes, nodules or enlargement. Cardiovascular - regular rate and rhythm, S1, S2 normal, no murmur, click, rub or gallop Lungs - chest clear, no wheezing, rales, normal symmetric air entry Abdomen - soft, non-tender; bowel sounds normal; no masses,  no organomegaly Extremities - no joint deformities, effusion, or inflammation and no edema  Neurologic Examination: Mental Status: Alert, oriented, no acute distress.  Speech fluent without evidence of aphasia. Able to follow commands without  difficulty. Cranial Nerves: II-Visual fields were normal. III/IV/VI-Pupils were equal and reacted normally to light. Extraocular movements were full and conjugate.    V/VII-no facial numbness and no facial weakness. VIII-normal. X-normal speech and symmetrical palatal movement. XI: trapezius strength/neck flexion strength normal bilaterally XII-midline tongue extension with normal strength. Motor: Mild bilateral triceps weakness, but no other abnormalities involving upper or lower extremities. Sensory: Normal throughout. Deep Tendon Reflexes: 2+ and symmetric including knee and ankle reflexes as well as triceps reflexes bilaterally. Plantars: Flexor bilaterally Cerebellar: Normal finger-to-nose testing. Carotid auscultation: Normal  Results for orders placed or performed during the hospital encounter of 08/28/15 (from the past 48 hour(s))  Urinalysis, Routine w reflex microscopic (not at [facility])     Status: Abnormal   Collection Time: 08/28/15  6:12 PM  Result Value Ref Range   Color, Urine YELLOW YELLOW   APPearance TURBID (A) CLEAR   Specific Gravity, Urine 1.021 1.005 - 1.030   pH 5.5 5.0 - 8.0   Glucose, UA NEGATIVE NEGATIVE mg/dL   Hgb urine dipstick LARGE (A) NEGATIVE   Bilirubin Urine NEGATIVE NEGATIVE   Ketones, ur 15 (A) NEGATIVE mg/dL   Protein, ur >300 (A) NEGATIVE mg/dL   Nitrite NEGATIVE NEGATIVE   Leukocytes, UA LARGE (A) NEGATIVE  Urine microscopic-add on     Status: Abnormal   Collection Time: 08/28/15  6:12 PM  Result Value Ref Range   Squamous Epithelial / LPF 6-30 (A) NONE SEEN   WBC, UA TOO NUMEROUS TO COUNT 0 - 5 WBC/hpf   RBC / HPF 6-30 0 - 5 RBC/hpf   Bacteria, UA MANY (A) NONE SEEN   Casts GRANULAR CAST (A) NEGATIVE   Urine-Other MUCOUS PRESENT   POC occult blood, ED Provider will collect     Status: None   Collection Time: 08/28/15  6:15 PM  Result Value Ref Range   Fecal Occult Bld NEGATIVE NEGATIVE  Ethanol     Status: None   Collection Time:  08/28/15  6:25 PM  Result Value Ref Range   Alcohol, Ethyl (B) <5 <5 mg/dL    Comment:        LOWEST DETECTABLE LIMIT FOR SERUM ALCOHOL IS 5 mg/dL FOR MEDICAL PURPOSES ONLY   Protime-INR     Status: Abnormal   Collection Time: 08/28/15  6:25 PM  Result Value Ref Range   Prothrombin Time 17.3 (H) 11.6 - 15.2 seconds   INR 1.40 0.00 - 1.49  APTT     Status: None   Collection Time: 08/28/15  6:25 PM  Result Value Ref Range   aPTT 35 24 - 37 seconds  CBC     Status: Abnormal   Collection Time: 08/28/15  6:25 PM  Result Value Ref Range   WBC 12.5 (H) 4.0 - 10.5 K/uL   RBC 3.18 (L) 3.87 - 5.11 MIL/uL   Hemoglobin 8.1 (L) 12.0 - 15.0 g/dL   HCT 26.8 (L) 36.0 - 46.0 %   MCV 84.3 78.0 - 100.0  fL   MCH 25.5 (L) 26.0 - 34.0 pg   MCHC 30.2 30.0 - 36.0 g/dL   RDW 17.5 (H) 11.5 - 15.5 %   Platelets 405 (H) 150 - 400 K/uL  Differential     Status: Abnormal   Collection Time: 08/28/15  6:25 PM  Result Value Ref Range   Neutrophils Relative % 83 %   Neutro Abs 10.2 (H) 1.7 - 7.7 K/uL   Lymphocytes Relative 8 %   Lymphs Abs 1.0 0.7 - 4.0 K/uL   Monocytes Relative 9 %   Monocytes Absolute 1.2 (H) 0.1 - 1.0 K/uL   Eosinophils Relative 0 %   Eosinophils Absolute 0.0 0.0 - 0.7 K/uL   Basophils Relative 0 %   Basophils Absolute 0.0 0.0 - 0.1 K/uL  Comprehensive metabolic panel     Status: Abnormal   Collection Time: 08/28/15  6:25 PM  Result Value Ref Range   Sodium 137 135 - 145 mmol/L   Potassium 3.6 3.5 - 5.1 mmol/L   Chloride 111 101 - 111 mmol/L   CO2 16 (L) 22 - 32 mmol/L   Glucose, Bld 115 (H) 65 - 99 mg/dL   BUN 32 (H) 6 - 20 mg/dL   Creatinine, Ser 2.48 (H) 0.44 - 1.00 mg/dL   Calcium 8.0 (L) 8.9 - 10.3 mg/dL   Total Protein 5.8 (L) 6.5 - 8.1 g/dL   Albumin 2.1 (L) 3.5 - 5.0 g/dL   AST 396 (H) 15 - 41 U/L   ALT 286 (H) 14 - 54 U/L   Alkaline Phosphatase 457 (H) 38 - 126 U/L   Total Bilirubin 0.9 0.3 - 1.2 mg/dL   GFR calc non Af Amer 18 (L) >60 mL/min   GFR calc Af Amer  21 (L) >60 mL/min    Comment: (NOTE) The eGFR has been calculated using the CKD EPI equation. This calculation has not been validated in all clinical situations. eGFR's persistently <60 mL/min signify possible Chronic Kidney Disease.    Anion gap 10 5 - 15  Urine rapid drug screen (hosp performed)not at [facility]     Status: Abnormal   Collection Time: 08/28/15  6:31 PM  Result Value Ref Range   Opiates POSITIVE (A) NONE DETECTED   Cocaine NONE DETECTED NONE DETECTED   Benzodiazepines NONE DETECTED NONE DETECTED   Amphetamines NONE DETECTED NONE DETECTED   Tetrahydrocannabinol NONE DETECTED NONE DETECTED   Barbiturates NONE DETECTED NONE DETECTED    Comment:        DRUG SCREEN FOR MEDICAL PURPOSES ONLY.  IF CONFIRMATION IS NEEDED FOR ANY PURPOSE, NOTIFY LAB WITHIN 5 DAYS.        LOWEST DETECTABLE LIMITS FOR URINE DRUG SCREEN Drug Class       Cutoff (ng/mL) Amphetamine      1000 Barbiturate      200 Benzodiazepine   032 Tricyclics       122 Opiates          300 Cocaine          300 THC              50   I-stat troponin, ED (not at [facility], [facility])     Status: None   Collection Time: 08/28/15  6:33 PM  Result Value Ref Range   Troponin i, poc 0.00 0.00 - 0.08 ng/mL   Comment 3            Comment: Due to the release kinetics of cTnI, a negative  result within the first hours of the onset of symptoms does not rule out myocardial infarction with certainty. If myocardial infarction is still suspected, repeat the test at appropriate intervals.   I-Stat Chem 8, ED  (not at [facility], [facility])     Status: Abnormal   Collection Time: 08/28/15  6:34 PM  Result Value Ref Range   Sodium 140 135 - 145 mmol/L   Potassium 3.6 3.5 - 5.1 mmol/L   Chloride 110 101 - 111 mmol/L   BUN 29 (H) 6 - 20 mg/dL   Creatinine, Ser 2.30 (H) 0.44 - 1.00 mg/dL   Glucose, Bld 110 (H) 65 - 99 mg/dL   Calcium, Ion 1.14 1.13 - 1.30 mmol/L   TCO2 17 0 - 100 mmol/L   Hemoglobin 8.8 (L) 12.0 - 15.0 g/dL   HCT 26.0 (L)  36.0 - 46.0 %   Mr Thoracic Spine Wo Contrast  08/28/2015  CLINICAL DATA:  75 year old female with 3 weeks of bilateral lower extremity weakness. Ataxia. Unintentional weight loss for 2 months. Anemia. Initial encounter. EXAM: MRI THORACIC SPINE WITHOUT CONTRAST TECHNIQUE: Multiplanar, multisequence MR imaging of the thoracic spine was performed. No intravenous contrast was administered. COMPARISON:  Chest radiographs 04/06/2013. Lumbar MRI from today reported separately. CT Abdomen and Pelvis 03/08/2012. FINDINGS: Limited sagittal imaging of the cervical spine suggests widespread advanced cervical spine disc and endplate degeneration (series 3, image 7). There is evidence of associated spinal stenosis, perhaps maximal at C3-C4. There is severe disc space loss at C7-T1 associated with mild spondylolisthesis and facet hypertrophy. No significant spinal stenosis results at this level. There is moderate to severe left C8 foraminal stenosis. There is a chronic mild to moderate T12 compression fracture. Elsewhere thoracic vertebral height and alignment is preserved. No marrow edema or evidence of acute osseous abnormality. The thoracic spinal canal is capacious. There are occasional small thoracic disc protrusions. At T11-T12 there is disc osteophyte complex - in part related to the mild T12 posterior superior endplate retropulsion - in addition to mild posterior element hypertrophy, but no significant thoracic spinal stenosis results. Spinal cord signal is within normal limits at all visualized levels. The conus medullaris occurs below the L1 level, see lumbar comparison from today. There is abnormal increased signal intensity in the left lower lobe (series 8, image 32), a within nonspecific appearance. There is aneurysmal enlargement of the descending thoracic aorta at the T9-T10 level measuring 4.3 cm with mural plaque or thrombus (series 8, image 30). There is also a small right lung pulmonary nodule on image 26.  Negative visualized upper abdominal viscera. IMPRESSION: 1. No thoracic spinal stenosis, despite a chronic T12 compression fracture. Very mild for age thoracic spine degenerative changes. 2. Advanced cervical spine degeneration suspected with probable cervical spinal stenosis. Cervical spine MRI would evaluate further. 3. 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 The descending thoracic aortic aneurysm diameters estimated at 4.3 cm on this exam Electronically Signed   By: Genevie Ann M.D.   On: 08/28/2015 20:23   Mr Lumbar Spine Wo Contrast  08/28/2015  CLINICAL DATA:  75 year old female with 3 weeks of bilateral lower extremity weakness. Ataxia. Unintentional weight loss for 2 months. Anemia. Initial encounter. EXAM: MRI LUMBAR SPINE WITHOUT CONTRAST TECHNIQUE: Multiplanar, multisequence MR imaging of the lumbar spine was performed. No intravenous contrast  was administered. COMPARISON:  Thoracic spine MRI from today reported separately. [facility] retrograde pyelogram 08/01/2015. Renal ultrasound 01/09/2015. CT Abdomen and Pelvis 03/08/2012 FINDINGS: Segmentation:  Normal Alignment: Chronic T12 compression fracture unchanged since 2014. Lumbar vertebral height and alignment is stable. There is mild retrolisthesis at L2-L3. Vertebrae: No marrow edema or evidence of acute osseous abnormality. Conus medullaris: Extends to the L1-L2 and appears normal. No lower thoracic spinal cord signal abnormality. Paraspinal and other soft tissues: Cholelithiasis. Furthermore, the CBD is dilated up to 16 mm diameter and there are low signal filling defects in the distal common bile duct as seen on series 6, image 10. These measure up to 10 mm diameter. The visible gallbladder does not appear inflamed.  Chronic severe left renal atrophy, left hydronephrosis, and left hydroureter. Fluid fluid levels in the left renal collecting system. There is evidence of a right ureteral stent. There is mild right and hydroureter which is new since 2014. Diverticulosis of the sigmoid colon is partially visible in the pelvis. Disc levels: Mild lumbar spine degeneration above the L4 vertebral level. No significant lumbar spinal stenosis at those levels. Also, there are no significant degenerative changes at L5-S1. L4-L5: Circumferential disc bulge with superimposed right paracentral broad-based disc protrusion. Superimposed moderate facet and ligament flavum hypertrophy with a blunted and facet joint fluid. Moderate spinal stenosis (series 6, image 26). Mild right greater than left L4 foraminal stenosis. IMPRESSION: 1. Cholelithiasis AND Choledocholithiasis with dilated CBD up to 16 mm. See series 6, images 4 through 13. Recommend Gastroenterology consultation. 2. Fairly mild for age lumbar spine degeneration. Isolated moderate lumbar spinal stenosis at L4-L5 related to disc and posterior element degeneration. No acute osseous abnormality. 3. 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 There appears to be a right side ureteral stent in place. 4. See also thoracic spine MRI findings from today reported separately. Electronically Signed   By: Genevie Ann M.D.   On: 08/28/2015 20:39    Assessment/Plan 75 year old lady presenting with transient severe back pain and ambulatory difficulty with weakness of her legs in addition to numbness in thighs. Etiology is unclear. Examination is unremarkable with no clinical signs of myelopathy nor lumbosacral radiculopathy. MRI showed no evidence of an acute abnormality involving the thoracic and lumbar spine.  Recommendations: 1. Can defer cervical spine MRI, as patient has no clinical signs of cervical myelopathy, and no upper extremity symptoms. 2. Physical therapy consult  for gait evaluation and recommendations 3. No further neurodiagnostic studies are indicated at this point, nor any further clinical neurological intervention indicated. We will plan to see this patient in follow-up on an as-needed basis. Please hesitate to call if patient symptoms return or questions arise otherwise.  C.R. Nicole Kindred, [place] [facility] [phone]  08/28/2015, 9:28 PM

## 2015-08-28 NOTE — Telephone Encounter (Signed)
I contacted the pt and spoke with her about her f/u. Pt agreed to coming in today at 4pm to be evaluated.

## 2015-08-28 NOTE — Progress Notes (Signed)
Patient ID: [name] [name], female   DOB: [birth year], 75 y.o.   MRN: [id number]   Reason for Appointment:  Acute visit  History of Present Illness   The patient was asked to come for evaluation because of abnormal labs done yesterday.  The patient has the following complaints:   Vomiting.  She says for the last week or 2 she has had periodic vomiting preceded by indigestion.  Yesterday she had fried chicken and could not retain it.  Today has not eaten anything.  She also has had decreased appetite for about 2 weeks.  On her last visit she had been having weight loss of unclear etiology and was given a trial of Reglan.  However this has not helped.  WEAKNESS: She has been getting gradually weaker in the last few days but today when getting out of her car in the parking lot she was only able to come to the door and had to use a wheelchair to come into the office.  She thinks she may have a feeling of numbness in her upper legs.  She  has not had any numbness in her lower legs.  Does not complain of any new urinary incontinence or fecal incontinence.  She has chronic back pain and is complaining of some pain and the lowest part of her back across  both sides.  RENAL dysfunction:  Renal function is significantly worse even though she has had  not had any urinary infections since she had a stent in her ureter.  She says her bladder function is improved recently and has not discomfort with urination   Lab Results  Component Value Date   CREATININE 1.90* 08/27/2015   CREATININE 1.32* 06/30/2015   CREATININE 1.41* 03/31/2015   CREATININE 1.50* 03/07/2015   Lab Results  Component Value Date   CREATININE 1.90* 08/27/2015   BUN 30* 08/27/2015   NA 138 08/27/2015   K 3.5 08/27/2015   CL 108 08/27/2015   CO2 19 08/27/2015      Type 2 diabetes mellitus, date of diagnosis: 1986.   The insulin regimen is: Levemir 20 units hs, Novolog 6-8 ac twice a day   Type 2 diabetes  has  been treated in the last few years with low dose basal bolus insulin regimen. She has not been taking  any oral hypoglycemic drugs including metformin partly because of GI side effects  A1c is fairly good at 6.4  She is checking her blood sugar at various times about once a day, has had somewhat variable readings, last reading today was 160 despite not eating any food, blood sugar is averaging about 120 and the morning and about 130 after meals  Side effects from medications: Diarrhea from metformin and nausea and vomiting from GLP-1 drugs   Monitors blood glucose: Less than 1 times a day  Glucometer: One Touch.  Blood Glucose readings: Recent average blood sugar 120 with a range 67-248, checking at various times  Meals: she is usually eating low fat meals but is sometimes eating out at lunch; meals usually at 11 AM,and supper at 5 pm.  Calorie intake: Usually controlled. Moderate Carbs Physical activity: exercise: Unable to do any   Dietician visit: Most recent:, 5/13.   Wt Readings from Last 3 Encounters:  08/01/15 137 lb (62.143 kg)  07/03/15 148 lb (67.132 kg)  04/04/15 152 lb 6.4 oz (69.128 kg)    Lab Results  Component Value Date   HGBA1C 6.4 06/30/2015  HGBA1C 5.6 12/27/2014   HGBA1C 6.6 10/25/2014   Lab Results  Component Value Date   MICROALBUR 119.4* 06/30/2015   LDLCALC 49 09/13/2013   CREATININE 1.90* 08/27/2015     3.  HYPOTHYROIDISM: She has had long-standing primary hypothyroidism TSH has been relatively low and because of her level of 0.21 she was told to take only 6-1/2 tablets a week of her 125 g dose TSH again is relatively low   Lab Results  Component Value Date   TSH 0.34* 08/27/2015   TSH 0.62 06/30/2015   TSH 0.21* 03/31/2015   FREET4 1.09 06/30/2015   FREET4 1.33 07/22/2014   FREET4 1.29 04/24/2014          Medication List       This list is accurate as of: 08/28/15  4:13 PM.  Always use your most recent med list.                 albuterol 108 (90 Base) MCG/ACT inhaler  Commonly known as:  PROVENTIL HFA;VENTOLIN HFA  Inhale 2 puffs into the lungs every 6 (six) hours as needed for wheezing.     aspirin 81 MG chewable tablet  Chew 81 mg by mouth at bedtime.     calcium-vitamin D 500-200 MG-UNIT tablet  Commonly known as:  OSCAL WITH D  Take 1 tablet by mouth every morning.     clopidogrel 75 MG tablet  Commonly known as:  PLAVIX  TAKE 1 TABLET EVERY MORNING     Cranberry 250 MG Tabs  Take 1 tablet by mouth daily.     CRESTOR 20 MG tablet  Generic drug:  rosuvastatin  take 1 tablet by mouth once daily     ferrous sulfate 325 (65 FE) MG tablet  Take 325 mg by mouth 3 (three) times a week.     furosemide 20 MG tablet  Commonly known as:  LASIX  take 1 tablet by mouth once daily     HYDROcodone-acetaminophen 5-325 MG tablet  Commonly known as:  NORCO/VICODIN  Take 1 tablet every 6 hours as needed for back pain     insulin aspart 100 UNIT/ML FlexPen  Commonly known as:  NOVOLOG  Inject 8 units three times a day with meals     Insulin Detemir 100 UNIT/ML Pen  Commonly known as:  LEVEMIR FLEXTOUCH  Inject 22 units at bedtime.     lactobacillus acidophilus Tabs tablet  Take 2 tablets by mouth 3 (three) times daily.     latanoprost 0.005 % ophthalmic solution  Commonly known as:  XALATAN  Place 1 drop into both eyes at bedtime.     levothyroxine 125 MCG tablet  Commonly known as:  SYNTHROID, LEVOTHROID  TAKE 1 TABLET DAILY     metoCLOPramide 5 MG tablet  Commonly known as:  REGLAN  Take 1 tablet (5 mg total) by mouth 4 (four) times daily.     metoprolol succinate 25 MG 24 hr tablet  Commonly known as:  TOPROL-XL  TAKE 1/2 TABLET (=12.5MG )  EVERY MORNING     omeprazole 20 MG capsule  Commonly known as:  PRILOSEC  TAKE 1 CAPSULE DAILY     phenazopyridine 200 MG tablet  Commonly known as:  PYRIDIUM  Take 1 tablet (200 mg total) by mouth 3 (three) times daily as needed for pain.      vitamin C 500 MG tablet  Commonly known as:  ASCORBIC ACID  Take 500 mg by mouth every morning.  Vitamin D (Ergocalciferol) 50000 units Caps capsule  Commonly known as:  DRISDOL  TAKE 1 CAPSULE BY MOUTH EVERY 7 DAYS ON THURSDAYS        Allergies: No Known Allergies  Past Medical History  Diagnosis Date  . CHF (congestive heart failure) ([place])   . Hypertension   . Hyperlipidemia   . Hypothyroidism   . Ischemic cardiomyopathy     03-25-2010-- per lasts echo EF  50-55%  . Coronary artery disease due to lipid rich plaque cardiologist-  dr berry    s/p CABG x6 1997-- cath 12-09-2009 occluded vein to obtuse marginal branch and ramus branch with patent vien to PDA and patent LIMA to LAD, ef 40%-- Myoview 11-24-2011, nonischemic  . Type 2 diabetes mellitus ([place])     monitored by dr Dwyane Dee  . Retroperitoneal fibrosis   . Bilateral hydronephrosis   . CKD (chronic kidney disease), stage III   . History of sepsis     10-18-2014 w/ acute pyelonephritis  . Dyspnea on exertion   . GERD (gastroesophageal reflux disease)   . PVD (peripheral vascular disease) with claudication ([place])     last duplex 07-04-2015 -- Right CCA and ICA chronic occlusion, [id number] LICA, Patent vertebral arteries w/ antegrade flow, bilateral normal subclavian arteries   . Carotid artery stenosis     carotid doppler 06/2012 - Right CCA/Bulb/ICA with chronic occlusion; L vertebral artery with abnormal blood flow; L Bulb/Prox ICA  s/p endarterectomy with mild fibrous plaque, 50% diameter reduction  . PAD (peripheral artery disease) ([place])     09/2010 LEAs - R ABI of 0.45, occluded fem-pop bypass graft, L ABI of 0.59 with occluded SFA; severe arterial insuff    Past Surgical History  Procedure Laterality Date  . Cystoscopy w/ ureteral stent placement  03/10/2012    Procedure: CYSTOSCOPY WITH RETROGRADE PYELOGRAM/URETERAL STENT PLACEMENT;  Surgeon: Hanley Ben, MD;  Location: [facility];  Service: Urology;  Laterality: Left;    . Carotid endarterectomy Bilateral right 1994//  left ?  Marland Kitchen Transthoracic echocardiogram  03/25/2010    EF 99991111, LV systolic function low normal with mild inferoseptal hypocontractility; LA mildly dilated; mod MR; mild TR, RV systolic pressure elevated, mild pulm HTN; AV mildly sclerotic; mild pulm valve regurg; aortic root sclerosis/calcif   . Coronary artery bypass graft  1997    x6; internal mammary to LAD, SVG to ramus #1 & #2, SVG to OM, SVG to PDA,   . Cardiac catheterization  12-09-2009  dr al little    EF >40%-- occluded vein to OM & ramus branches, patent vein to PDA, patent LIMA to LAD (Dr. [name], [facility]) - later had thrombectomy of R fem-pop bypass ad R common femoral & profunda femoris artery (Dr. Oneida Alar)  . Repair right femoral pseudoaneuysm/  right fem-pop bypass graft/  debridement right lower extremitiy venous status ulcers x2  01-12-2005  . Aorta - bilateral femoral artery bypass graft  1997    and RIGHT FEM-POP   . Cardiovascular stress test  11-24-2011   dr berry    Low Risk study: fixed basal to mid inferior attenuation artifact, no reversible ischemia,  normal LV function and wall motion , ef 67%  . Total abdominal hysterectomy w/ bilateral salpingoophorectomy  1986  . Cataract extraction w/ intraocular lens  implant, bilateral  2006  . Cystoscopy w/ ureteral stent placement Bilateral 03/07/2015    Procedure: BILATERAL RETROGRADE PYELOGRAM AND RIGHT URETERAL STENT PLACEMENT;  Surgeon: Ardis Hughs, MD;  Location: [facility];  Service: Urology;  Laterality: Bilateral;  . Cystoscopy w/ ureteral stent placement Right 08/01/2015    Procedure: CYSTOSCOPY WITH STENT REPLACEMENT;  Surgeon: Ardis Hughs, MD;  Location[facility];  Service: Urology;  Laterality: Right;  . Cystoscopy w/ retrogrades Right 08/01/2015    Procedure: CYSTOSCOPY WITH RETROGRADE PYELOGRAM;  Surgeon: Ardis Hughs, MD;  Location: [facility];   Service: Urology;  Laterality: Right;    Family History  Problem Relation Age of Onset  . Congestive Heart Failure Mother   . Diabetes Mother   . Stroke Father   . Cancer Maternal Aunt     Breast cancer    Social History:  reports that she quit smoking about 6 years ago. She has never used smokeless tobacco. She reports that she does not drink alcohol or use illicit drugs.  Review of Systems -   She has had mild chronic constipation, going every 2-3 days. No blood in the stools or black stools recently No abdominal pain  Hyperlipidemia: Has history of high LDL and triglycerides/low HDL  Was changed from Lipitor to Crestor because of higher LDL and she was not wanting to pay the higher cost of adding Zetia   LDL is improved partly from her weight loss and decreased appetite  Tends to have high triglycerides also despite taking fenofibrate   Lab Results  Component Value Date   CHOL 131 06/30/2015   HDL 26.90* 06/30/2015   LDLCALC 49 09/13/2013   LDLDIRECT 59.0 06/30/2015   TRIG 272.0* 06/30/2015   CHOLHDL 5 06/30/2015   . LIVER dysfunction: Although her liver tests were normal earlier this year she has a marked increase in liver tests  Lab Results  Component Value Date   ALT 154* 08/27/2015    Iron Deficiency anemia:  Has had mild anemia with  iron deficiency Previously. Takes iron 3 times a week   Also taking B12.    Hemoglobin appears to be much lower and progressive in the last month  Lab Results  Component Value Date   WBC 10.2 08/27/2015   HGB 8.6 Repeated and verified X2.* 08/27/2015   HCT 26.9* 08/27/2015   MCV 79.7 08/27/2015   PLT 407.0* 08/27/2015    Vitamin D deficiency: She has been on supplements and taking her weekly dose, last level was checked in 04/2013    Examination:   BP 118/50 mmHg  Pulse 94  Ht   Wt   SpO2 97%  There is no weight on file to calculate BMI.    She looks pale No lymphadenopathy in the neck Mucous membranes  moist No flank tenderness Heart sounds normal Lungs clear Abdomen shows no distention,  mass or tenderness No spinal tenderness, has 2 prominent lumbar spines No ankle edema Neurologic exam: He is unable to stand up She has proximal leg weakness, muscle power 3/5 proximally and 4+/5 distally Current sensation appears normal Knee jerks are absent   Assesment/PLAN:   WEAKNESS: She has significant proximal muscle weakness which started relatively abruptly today without any other neurological symptoms.  Not clear if this is related to systemic problem or spinal stenosis type of issue  Since she is unable to stand up on her own will need to have her admitted to the hospital for further evaluation, may need neurology consultation  VOMITING and nausea, recent and associated with significant liver dysfunction of unclear etiology. Not icteric and liver exam appears normal We will need at least  ultrasound for evaluation  ANEMIA: This is relatively significant with hemoglobin 8.6 and very low iron saturation.  Will have this evaluated in the hospital, may need GI consultation for endoscopy  DIABETES: Appears well-controlled on current basal bolus insulin  HYPOTHYROIDISM: Her TSH is slightly low and will need adjustment of her dose, probably will need 100 g  Renal insufficiency: This may be related to poor intake, needs hydration especially with low normal blood pressure  Everado Pillsbury  08/28/2015

## 2015-08-28 NOTE — ED Provider Notes (Signed)
CSN: [id number]     Arrival date & time 08/28/15  1736 History   First MD Initiated Contact with Patient 08/28/15 1744     Chief Complaint  Patient presents with  . Weakness     (Consider location/radiation/quality/duration/timing/severity/associated sxs/prior Treatment) HPI Reports weakness in both legs onset 2 weeks ago. She reports that she could not stand today. She also complained of numbness in bilateral thighs. She is presently asymptomatic except for mild low back pain. Patient suffers from chronic back pain. No other associated symptoms no loss of bladder or bowel control no fever. No treatment prior to coming here. She was sent here from Dr. Ronnie Derby office for further evaluation. Symptoms resolve spontaneously. Past Medical History  Diagnosis Date  . CHF (congestive heart failure) ([place])   . Hypertension   . Hyperlipidemia   . Hypothyroidism   . Ischemic cardiomyopathy     03-25-2010-- per lasts echo EF  50-55%  . Coronary artery disease due to lipid rich plaque cardiologist-  dr berry    s/p CABG x6 1997-- cath 12-09-2009 occluded vein to obtuse marginal branch and ramus branch with patent vien to PDA and patent LIMA to LAD, ef 40%-- Myoview 11-24-2011, nonischemic  . Type 2 diabetes mellitus ([place])     monitored by dr Dwyane Dee  . Retroperitoneal fibrosis   . Bilateral hydronephrosis   . CKD (chronic kidney disease), stage III   . History of sepsis     10-18-2014 w/ acute pyelonephritis  . Dyspnea on exertion   . GERD (gastroesophageal reflux disease)   . PVD (peripheral vascular disease) with claudication ([place])     last duplex 07-04-2015 -- Right CCA and ICA chronic occlusion, [id number] LICA, Patent vertebral arteries w/ antegrade flow, bilateral normal subclavian arteries   . Carotid artery stenosis     carotid doppler 06/2012 - Right CCA/Bulb/ICA with chronic occlusion; L vertebral artery with abnormal blood flow; L Bulb/Prox ICA  s/p endarterectomy with mild fibrous plaque,  50% diameter reduction  . PAD (peripheral artery disease) ([place])     09/2010 LEAs - R ABI of 0.45, occluded fem-pop bypass graft, L ABI of 0.59 with occluded SFA; severe arterial insuff   Past Surgical History  Procedure Laterality Date  . Cystoscopy w/ ureteral stent placement  03/10/2012    Procedure: CYSTOSCOPY WITH RETROGRADE PYELOGRAM/URETERAL STENT PLACEMENT;  Surgeon: Hanley Ben, MD;  Location: [facility];  Service: Urology;  Laterality: Left;  . Carotid endarterectomy Bilateral right 1994//  left ?  Marland Kitchen Transthoracic echocardiogram  03/25/2010    EF [id number], LV systolic function low normal with mild inferoseptal hypocontractility; LA mildly dilated; mod MR; mild TR, RV systolic pressure elevated, mild pulm HTN; AV mildly sclerotic; mild pulm valve regurg; aortic root sclerosis/calcif   . Coronary artery bypass graft  1997    x6; internal mammary to LAD, SVG to ramus #1 & #2, SVG to OM, SVG to PDA,   . Cardiac catheterization  12-09-2009  dr al little    EF >40%-- occluded vein to OM & ramus branches, patent vein to PDA, patent LIMA to LAD (Dr. [name], [facility]) - later had thrombectomy of R fem-pop bypass ad R common femoral & profunda femoris artery (Dr. Oneida Alar)  . Repair right femoral pseudoaneuysm/  right fem-pop bypass graft/  debridement right lower extremitiy venous status ulcers x2  01-12-2005  . Aorta - bilateral femoral artery bypass graft  1997    and RIGHT FEM-POP   . Cardiovascular stress test  11-24-2011  dr berry    Low Risk study: fixed basal to mid inferior attenuation artifact, no reversible ischemia,  normal LV function and wall motion , ef 67%  . Total abdominal hysterectomy w/ bilateral salpingoophorectomy  1986  . Cataract extraction w/ intraocular lens  implant, bilateral  2006  . Cystoscopy w/ ureteral stent placement Bilateral 03/07/2015    Procedure: BILATERAL RETROGRADE PYELOGRAM AND RIGHT URETERAL STENT PLACEMENT;  Surgeon: Ardis Hughs, MD;  Location: [facility];  Service: Urology;  Laterality: Bilateral;  . Cystoscopy w/ ureteral stent placement Right 08/01/2015    Procedure: CYSTOSCOPY WITH STENT REPLACEMENT;  Surgeon: Ardis Hughs, MD;  Location: [facility];  Service: Urology;  Laterality: Right;  . Cystoscopy w/ retrogrades Right 08/01/2015    Procedure: CYSTOSCOPY WITH RETROGRADE PYELOGRAM;  Surgeon: Ardis Hughs, MD;  Location: [facility];  Service: Urology;  Laterality: Right;   Family History  Problem Relation Age of Onset  . Congestive Heart Failure Mother   . Diabetes Mother   . Stroke Father   . Cancer Maternal Aunt     Breast cancer   Social History  Substance Use Topics  . Smoking status: Former Smoker -- 2.00 packs/day for 30 years    Quit date: 06/07/2009  . Smokeless tobacco: Never Used  . Alcohol Use: Yes     Comment: occasuional   OB History    No data available     Review of Systems  HENT: Negative.   Respiratory: Negative.   Cardiovascular: Negative.   Gastrointestinal: Negative.   Musculoskeletal: Positive for back pain and gait problem.       Walks with cane  Skin: Negative.   Allergic/Immunologic: Positive for immunocompromised state.       Diabetic  Neurological: Positive for weakness.       Weakness in both legs  Psychiatric/Behavioral: Negative.   All other systems reviewed and are negative.     Allergies  Review of patient's allergies indicates no known allergies.  Home Medications   Prior to Admission medications   Medication Sig Start Date End Date Taking? Authorizing Provider  albuterol (PROVENTIL HFA;VENTOLIN HFA) 108 (90 BASE) MCG/ACT inhaler Inhale 2 puffs into the lungs every 6 (six) hours as needed for wheezing. 02/22/14  Yes [name], MD  aspirin 81 MG chewable tablet Chew 81 mg by mouth at bedtime.    Yes Historical Provider, MD  calcium-vitamin D (OSCAL WITH D) 500-200 MG-UNIT per tablet Take 1 tablet by mouth every  morning.   Yes Historical Provider, MD  clopidogrel (PLAVIX) 75 MG tablet TAKE 1 TABLET EVERY MORNING 06/30/15  Yes [name], MD  Cranberry 250 MG TABS Take 1 tablet by mouth daily.    Yes Historical Provider, MD  CRESTOR 20 MG tablet take 1 tablet by mouth once daily 06/05/15  Yes [name], MD  ferrous sulfate 325 (65 FE) MG tablet Take 325 mg by mouth 3 (three) times a week.    Yes Historical Provider, MD  HYDROcodone-acetaminophen (NORCO/VICODIN) 5-325 MG tablet Take 1 tablet every 6 hours as needed for back pain 08/27/15  Yes [name], MD  insulin aspart (NOVOLOG) 100 UNIT/ML FlexPen Inject 8 units three times a day with meals 09/30/14  Yes [name], MD  Insulin Detemir (LEVEMIR FLEXTOUCH) 100 UNIT/ML Pen Inject 22 units at bedtime. Patient taking differently: Inject 18 Units into the skin at bedtime. Inject 20 units at bedtime. 01/13/15  Yes [name], MD  lactobacillus acidophilus (BACID) TABS tablet Take 2 tablets by mouth 3 (three) times daily.   Yes Historical Provider, MD  latanoprost (XALATAN) 0.005 % ophthalmic solution Place 1 drop into both eyes at bedtime. 03/11/14  Yes Historical Provider, MD  levothyroxine (SYNTHROID, LEVOTHROID) 125 MCG tablet TAKE 1 TABLET DAILY Patient taking differently: TAKE 1 TABLET DAILY---  takes in am takes only 1/2 tablet on Saturday 02/12/15  Yes [name], MD  metoprolol succinate (TOPROL-XL) 25 MG 24 hr tablet TAKE 1/2 TABLET (=12.5MG )  EVERY MORNING 06/24/15  Yes [name], MD  omeprazole (PRILOSEC) 20 MG capsule TAKE 1 CAPSULE DAILY Patient taking differently: TAKE 1 CAPSULE DAILY--  takes in am 02/12/15  Yes [name], MD  vitamin C (ASCORBIC ACID) 500 MG tablet Take 500 mg by mouth every morning.   Yes Historical Provider, MD  Vitamin D, Ergocalciferol, (DRISDOL) 50000 units CAPS capsule TAKE 1 CAPSULE BY MOUTH EVERY 7 DAYS ON THURSDAYS 08/19/15  Yes [name], MD  furosemide (LASIX) 20 MG tablet take 1 tablet by mouth once daily Patient taking  differently: take 1 tablet by mouth once prn 12/25/14   [name], MD  metoCLOPramide (REGLAN) 5 MG tablet Take 1 tablet (5 mg total) by mouth 4 (four) times daily. 07/03/15   [name], MD  phenazopyridine (PYRIDIUM) 200 MG tablet Take 1 tablet (200 mg total) by mouth 3 (three) times daily as needed for pain. 08/01/15   Ardis Hughs, MD   BP 115/53 mmHg  Pulse 75  Temp(Src) 97.5 F (36.4 C) (Oral)  Resp 16  SpO2 98% Physical Exam  Constitutional: She is oriented to person, place, and time. No distress.  Chronically ill-appearing alert. No distress  HENT:  Head: Normocephalic and atraumatic.  Eyes: Conjunctivae are normal. Pupils are equal, round, and reactive to light.  Conjunctiva pale  Neck: Neck supple. No tracheal deviation present. No thyromegaly present.  Cardiovascular: Normal rate and regular rhythm.   No murmur heard. Pulmonary/Chest: Effort normal and breath sounds normal.  Abdominal: Soft. Bowel sounds are normal. She exhibits no distension. There is no tenderness.  Musculoskeletal: Normal range of motion. She exhibits no edema or tenderness.  Thoracic kyphosis. No tenderness along spine.  Neurological: She is alert and oriented to person, place, and time. She has normal reflexes. Coordination normal.  Walks with cane without difficulty  Skin: Skin is warm and dry. No rash noted.  Psychiatric: She has a normal mood and affect.  Nursing note and vitals reviewed.   ED Course  Procedures (including critical care time) Labs Review Labs Reviewed - No data to display  Imaging Review No results found. I have personally reviewed and evaluated these images and lab results as part of my medical decision-making.   EKG Interpretation   Date/Time:  Thursday August 28 2015 17:52:47 EDT Ventricular Rate:  76 PR Interval:    QRS Duration: 82 QT Interval:  393 QTC Calculation: 442 R Axis:   90 Text Interpretation:  Sinus rhythm Short PR interval Borderline right axis   deviation Probable LVH with secondary repol abnrm Artifact in lead(s) I II  III aVR aVL aVF V1 V2 V3 V4 V5 No significant change since last tracing  Confirmed by Winfred Leeds  MD, Ericberto Padget [phone]) on 08/28/2015 6:57:04 PM     Patient exhibited no weakness on repeat neurologic exam by neurologist. Feels that she is clear from a neurologic standpoint. Dr. Nicole Kindred does not feel that she needs MRI of her cervical spine. Results  for orders placed or performed during the hospital encounter of 08/28/15  Ethanol  Result Value Ref Range   Alcohol, Ethyl (B) <5 <5 mg/dL  Protime-INR  Result Value Ref Range   Prothrombin Time 17.3 (H) 11.6 - 15.2 seconds   INR 1.40 0.00 - 1.49  APTT  Result Value Ref Range   aPTT 35 24 - 37 seconds  CBC  Result Value Ref Range   WBC 12.5 (H) 4.0 - 10.5 K/uL   RBC 3.18 (L) 3.87 - 5.11 MIL/uL   Hemoglobin 8.1 (L) 12.0 - 15.0 g/dL   HCT 26.8 (L) 36.0 - 46.0 %   MCV 84.3 78.0 - 100.0 fL   MCH 25.5 (L) 26.0 - 34.0 pg   MCHC 30.2 30.0 - 36.0 g/dL   RDW 17.5 (H) 11.5 - 15.5 %   Platelets 405 (H) 150 - 400 K/uL  Differential  Result Value Ref Range   Neutrophils Relative % 83 %   Neutro Abs 10.2 (H) 1.7 - 7.7 K/uL   Lymphocytes Relative 8 %   Lymphs Abs 1.0 0.7 - 4.0 K/uL   Monocytes Relative 9 %   Monocytes Absolute 1.2 (H) 0.1 - 1.0 K/uL   Eosinophils Relative 0 %   Eosinophils Absolute 0.0 0.0 - 0.7 K/uL   Basophils Relative 0 %   Basophils Absolute 0.0 0.0 - 0.1 K/uL  Comprehensive metabolic panel  Result Value Ref Range   Sodium 137 135 - 145 mmol/L   Potassium 3.6 3.5 - 5.1 mmol/L   Chloride 111 101 - 111 mmol/L   CO2 16 (L) 22 - 32 mmol/L   Glucose, Bld 115 (H) 65 - 99 mg/dL   BUN 32 (H) 6 - 20 mg/dL   Creatinine, Ser 2.48 (H) 0.44 - 1.00 mg/dL   Calcium 8.0 (L) 8.9 - 10.3 mg/dL   Total Protein 5.8 (L) 6.5 - 8.1 g/dL   Albumin 2.1 (L) 3.5 - 5.0 g/dL   AST 396 (H) 15 - 41 U/L   ALT 286 (H) 14 - 54 U/L   Alkaline Phosphatase 457 (H) 38 - 126 U/L    Total Bilirubin 0.9 0.3 - 1.2 mg/dL   GFR calc non Af Amer 18 (L) >60 mL/min   GFR calc Af Amer 21 (L) >60 mL/min   Anion gap 10 5 - 15  Urine rapid drug screen (hosp performed)not at [facility]  Result Value Ref Range   Opiates POSITIVE (A) NONE DETECTED   Cocaine NONE DETECTED NONE DETECTED   Benzodiazepines NONE DETECTED NONE DETECTED   Amphetamines NONE DETECTED NONE DETECTED   Tetrahydrocannabinol NONE DETECTED NONE DETECTED   Barbiturates NONE DETECTED NONE DETECTED  Urinalysis, Routine w reflex microscopic (not at [facility])  Result Value Ref Range   Color, Urine YELLOW YELLOW   APPearance TURBID (A) CLEAR   Specific Gravity, Urine 1.021 1.005 - 1.030   pH 5.5 5.0 - 8.0   Glucose, UA NEGATIVE NEGATIVE mg/dL   Hgb urine dipstick LARGE (A) NEGATIVE   Bilirubin Urine NEGATIVE NEGATIVE   Ketones, ur 15 (A) NEGATIVE mg/dL   Protein, ur >300 (A) NEGATIVE mg/dL   Nitrite NEGATIVE NEGATIVE   Leukocytes, UA LARGE (A) NEGATIVE  Urine microscopic-add on  Result Value Ref Range   Squamous Epithelial / LPF 6-30 (A) NONE SEEN   WBC, UA TOO NUMEROUS TO COUNT 0 - 5 WBC/hpf   RBC / HPF 6-30 0 - 5 RBC/hpf   Bacteria, UA MANY (A) NONE SEEN  Casts GRANULAR CAST (A) NEGATIVE   Urine-Other MUCOUS PRESENT   I-Stat Chem 8, ED  (not at [facility], [facility])  Result Value Ref Range   Sodium 140 135 - 145 mmol/L   Potassium 3.6 3.5 - 5.1 mmol/L   Chloride 110 101 - 111 mmol/L   BUN 29 (H) 6 - 20 mg/dL   Creatinine, Ser 2.30 (H) 0.44 - 1.00 mg/dL   Glucose, Bld 110 (H) 65 - 99 mg/dL   Calcium, Ion 1.14 1.13 - 1.30 mmol/L   TCO2 17 0 - 100 mmol/L   Hemoglobin 8.8 (L) 12.0 - 15.0 g/dL   HCT 26.0 (L) 36.0 - 46.0 %  I-stat troponin, ED (not at [facility], [facility])  Result Value Ref Range   Troponin i, poc 0.00 0.00 - 0.08 ng/mL   Comment 3          POC occult blood, ED Provider will collect  Result Value Ref Range   Fecal Occult Bld NEGATIVE NEGATIVE   Mr Thoracic Spine Wo Contrast  08/28/2015  CLINICAL DATA:   75 year old female with 3 weeks of bilateral lower extremity weakness. Ataxia. Unintentional weight loss for 2 months. Anemia. Initial encounter. EXAM: MRI THORACIC SPINE WITHOUT CONTRAST TECHNIQUE: Multiplanar, multisequence MR imaging of the thoracic spine was performed. No intravenous contrast was administered. COMPARISON:  Chest radiographs 04/06/2013. Lumbar MRI from today reported separately. CT Abdomen and Pelvis 03/08/2012. FINDINGS: Limited sagittal imaging of the cervical spine suggests widespread advanced cervical spine disc and endplate degeneration (series 3, image 7). There is evidence of associated spinal stenosis, perhaps maximal at C3-C4. There is severe disc space loss at C7-T1 associated with mild spondylolisthesis and facet hypertrophy. No significant spinal stenosis results at this level. There is moderate to severe left C8 foraminal stenosis. There is a chronic mild to moderate T12 compression fracture. Elsewhere thoracic vertebral height and alignment is preserved. No marrow edema or evidence of acute osseous abnormality. The thoracic spinal canal is capacious. There are occasional small thoracic disc protrusions. At T11-T12 there is disc osteophyte complex - in part related to the mild T12 posterior superior endplate retropulsion - in addition to mild posterior element hypertrophy, but no significant thoracic spinal stenosis results. Spinal cord signal is within normal limits at all visualized levels. The conus medullaris occurs below the L1 level, see lumbar comparison from today. There is abnormal increased signal intensity in the left lower lobe (series 8, image 32), a within nonspecific appearance. There is aneurysmal enlargement of the descending thoracic aorta at the T9-T10 level measuring 4.3 cm with mural plaque or thrombus (series 8, image 30). There is also a small right lung pulmonary nodule on image 26. Negative visualized upper abdominal viscera. IMPRESSION: 1. No thoracic spinal  stenosis, despite a chronic T12 compression fracture. Very mild for age thoracic spine degenerative changes. 2. Advanced cervical spine degeneration suspected with probable cervical spinal stenosis. Cervical spine MRI would evaluate further. 3. 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 The descending thoracic aortic aneurysm diameters estimated at 4.3 cm on this exam Electronically Signed   By: Genevie Ann M.D.   On: 08/28/2015 20:23   Mr Lumbar Spine Wo Contrast  08/28/2015  CLINICAL DATA:  75 year old female with 3 weeks of bilateral lower extremity weakness. Ataxia. Unintentional weight loss for 2 months. Anemia. Initial  encounter. EXAM: MRI LUMBAR SPINE WITHOUT CONTRAST TECHNIQUE: Multiplanar, multisequence MR imaging of the lumbar spine was performed. No intravenous contrast was administered. COMPARISON:  Thoracic spine MRI from today reported separately. [facility] retrograde pyelogram 08/01/2015. Renal ultrasound 01/09/2015. CT Abdomen and Pelvis 03/08/2012 FINDINGS: Segmentation:  Normal Alignment: Chronic T12 compression fracture unchanged since 2014. Lumbar vertebral height and alignment is stable. There is mild retrolisthesis at L2-L3. Vertebrae: No marrow edema or evidence of acute osseous abnormality. Conus medullaris: Extends to the L1-L2 and appears normal. No lower thoracic spinal cord signal abnormality. Paraspinal and other soft tissues: Cholelithiasis. Furthermore, the CBD is dilated up to 16 mm diameter and there are low signal filling defects in the distal common bile duct as seen on series 6, image 10. These measure up to 10 mm diameter. The visible gallbladder does not appear inflamed. Chronic severe left renal atrophy, left hydronephrosis, and left hydroureter.  Fluid fluid levels in the left renal collecting system. There is evidence of a right ureteral stent. There is mild right and hydroureter which is new since 2014. Diverticulosis of the sigmoid colon is partially visible in the pelvis. Disc levels: Mild lumbar spine degeneration above the L4 vertebral level. No significant lumbar spinal stenosis at those levels. Also, there are no significant degenerative changes at L5-S1. L4-L5: Circumferential disc bulge with superimposed right paracentral broad-based disc protrusion. Superimposed moderate facet and ligament flavum hypertrophy with a blunted and facet joint fluid. Moderate spinal stenosis (series 6, image 26). Mild right greater than left L4 foraminal stenosis. IMPRESSION: 1. Cholelithiasis AND Choledocholithiasis with dilated CBD up to 16 mm. See series 6, images 4 through 13. Recommend Gastroenterology consultation. 2. Fairly mild for age lumbar spine degeneration. Isolated moderate lumbar spinal stenosis at L4-L5 related to disc and posterior element degeneration. No acute osseous abnormality. 3. 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 There appears to be a right side ureteral stent in place. 4. See also thoracic spine MRI findings from today reported separately. Electronically Signed   By: Genevie Ann M.D.   On: 08/28/2015 20:39    MDM  Neurology was consulted as I was concerned for discitis or epidural abscess. Neurology does not feel that brain imaging is indicated. Patient exhibits acute kidney injury. Dr. Maudie Mercury from hospitalist service was consulted and saw patient in the emergency department. Urine culture ordered in this elderly immunocompromised patient with back pain. Will treat empirically with antibiotics. Anemia is chronic Final diagnoses:  None   Diagnosis #1 weakness #2 acute kidney injury #3 urinary tract infection      Orlie Dakin, MD 08/28/15 2214

## 2015-08-29 ENCOUNTER — Inpatient Hospital Stay (HOSPITAL_COMMUNITY): Payer: Medicare Other

## 2015-08-29 DIAGNOSIS — I950 Idiopathic hypotension: Secondary | ICD-10-CM

## 2015-08-29 DIAGNOSIS — K8050 Calculus of bile duct without cholangitis or cholecystitis without obstruction: Secondary | ICD-10-CM

## 2015-08-29 DIAGNOSIS — R112 Nausea with vomiting, unspecified: Secondary | ICD-10-CM

## 2015-08-29 DIAGNOSIS — E43 Unspecified severe protein-calorie malnutrition: Secondary | ICD-10-CM | POA: Insufficient documentation

## 2015-08-29 DIAGNOSIS — R7989 Other specified abnormal findings of blood chemistry: Secondary | ICD-10-CM

## 2015-08-29 DIAGNOSIS — R630 Anorexia: Secondary | ICD-10-CM

## 2015-08-29 DIAGNOSIS — E1122 Type 2 diabetes mellitus with diabetic chronic kidney disease: Secondary | ICD-10-CM

## 2015-08-29 DIAGNOSIS — N184 Chronic kidney disease, stage 4 (severe): Secondary | ICD-10-CM

## 2015-08-29 DIAGNOSIS — L8990 Pressure ulcer of unspecified site, unspecified stage: Secondary | ICD-10-CM | POA: Insufficient documentation

## 2015-08-29 DIAGNOSIS — R0600 Dyspnea, unspecified: Secondary | ICD-10-CM

## 2015-08-29 LAB — ECHOCARDIOGRAM COMPLETE
AVLVOTPG: 6 mmHg
CHL CUP DOP CALC LVOT VTI: 25 cm
CHL CUP MV DEC (S): 194
CHL CUP STROKE VOLUME: 54 mL
E decel time: 194 msec
EERAT: 15.03
FS: 16 % — AB (ref 28–44)
Height: 66 in
IV/PV OW: 1.79
LA diam index: 2.08 cm/m2
LA vol A4C: 32.1 ml
LASIZE: 35 mm
LAVOL: 44 mL
LAVOLIN: 26.2 mL/m2
LDCA: 3.14 cm2
LEFT ATRIUM END SYS DIAM: 35 mm
LV PW d: 7.02 mm — AB (ref 0.6–1.1)
LV e' LATERAL: 7.72 cm/s
LV sys vol index: 17 mL/m2
LV sys vol: 29 mL (ref 14–42)
LVDIAVOL: 84 mL (ref 46–106)
LVDIAVOLIN: 50 mL/m2
LVEEAVG: 15.03
LVEEMED: 15.03
LVOT SV: 79 mL
LVOT diameter: 20 mm
LVOTPV: 125 cm/s
MV pk E vel: 116 m/s
MVPG: 5 mmHg
MVPKAVEL: 106 m/s
Reg peak vel: 206 cm/s
Simpson's disk: 65
TAPSE: 15.6 mm
TDI e' lateral: 7.72
TDI e' medial: 5.98
TR max vel: 206 cm/s
Weight: 2136 oz

## 2015-08-29 LAB — COMPREHENSIVE METABOLIC PANEL
ALK PHOS: 380 U/L — AB (ref 38–126)
ALT: 214 U/L — ABNORMAL HIGH (ref 14–54)
ANION GAP: 6 (ref 5–15)
AST: 195 U/L — ABNORMAL HIGH (ref 15–41)
Albumin: 1.8 g/dL — ABNORMAL LOW (ref 3.5–5.0)
BILIRUBIN TOTAL: 0.5 mg/dL (ref 0.3–1.2)
BUN: 31 mg/dL — ABNORMAL HIGH (ref 6–20)
CALCIUM: 7.7 mg/dL — AB (ref 8.9–10.3)
CO2: 17 mmol/L — ABNORMAL LOW (ref 22–32)
Chloride: 112 mmol/L — ABNORMAL HIGH (ref 101–111)
Creatinine, Ser: 2.27 mg/dL — ABNORMAL HIGH (ref 0.44–1.00)
GFR, EST AFRICAN AMERICAN: 23 mL/min — AB (ref >60)
GFR, EST NON AFRICAN AMERICAN: 20 mL/min — AB (ref >60)
Glucose, Bld: 194 mg/dL — ABNORMAL HIGH (ref 65–99)
Potassium: 3.5 mmol/L (ref 3.5–5.1)
Sodium: 135 mmol/L (ref 135–145)
TOTAL PROTEIN: 5.3 g/dL — AB (ref 6.5–8.1)

## 2015-08-29 LAB — ACETAMINOPHEN LEVEL

## 2015-08-29 LAB — CBC WITH DIFFERENTIAL/PLATELET
BASOS ABS: 0 10*3/uL (ref 0.0–0.1)
BASOS PCT: 0 %
Eosinophils Absolute: 0.1 10*3/uL (ref 0.0–0.7)
Eosinophils Relative: 1 %
HEMATOCRIT: 25.3 % — AB (ref 36.0–46.0)
HEMOGLOBIN: 7.5 g/dL — AB (ref 12.0–15.0)
Lymphocytes Relative: 6 %
Lymphs Abs: 0.6 10*3/uL — ABNORMAL LOW (ref 0.7–4.0)
MCH: 24.3 pg — ABNORMAL LOW (ref 26.0–34.0)
MCHC: 29.6 g/dL — ABNORMAL LOW (ref 30.0–36.0)
MCV: 81.9 fL (ref 78.0–100.0)
Monocytes Absolute: 1 10*3/uL (ref 0.1–1.0)
Monocytes Relative: 11 %
NEUTROS ABS: 7.7 10*3/uL (ref 1.7–7.7)
NEUTROS PCT: 82 %
Platelets: 339 10*3/uL (ref 150–400)
RBC: 3.09 MIL/uL — ABNORMAL LOW (ref 3.87–5.11)
RDW: 17.5 % — AB (ref 11.5–15.5)
WBC: 9.3 10*3/uL (ref 4.0–10.5)

## 2015-08-29 LAB — GLUCOSE, CAPILLARY
GLUCOSE-CAPILLARY: 175 mg/dL — AB (ref 65–99)
GLUCOSE-CAPILLARY: 134 mg/dL — AB (ref 65–99)
GLUCOSE-CAPILLARY: 130 mg/dL — AB (ref 65–99)
GLUCOSE-CAPILLARY: 145 mg/dL — AB (ref 65–99)
Glucose-Capillary: 148 mg/dL — ABNORMAL HIGH (ref 65–99)

## 2015-08-29 LAB — TSH: TSH: 0.496 u[IU]/mL (ref 0.350–4.500)

## 2015-08-29 LAB — MRSA PCR SCREENING: MRSA by PCR: NEGATIVE

## 2015-08-29 LAB — TROPONIN I: Troponin I: <0.03 ng/mL (ref <0.031)

## 2015-08-29 LAB — CK TOTAL AND CKMB (NOT AT ARMC)
CK TOTAL: 36 U/L — AB (ref 38–234)
CK, MB: 2.1 ng/mL (ref 0.5–5.0)
Relative Index: INVALID (ref 0.0–2.5)

## 2015-08-29 LAB — GAMMA GT: GGT: 278 U/L — ABNORMAL HIGH (ref 7–50)

## 2015-08-29 MED ORDER — SODIUM CHLORIDE 0.9% FLUSH
3.0000 mL | Freq: Two times a day (BID) | INTRAVENOUS | Status: DC
  Administered 2015-08-29 – 2015-09-08 (×17): 3 mL via INTRAVENOUS

## 2015-08-29 MED ORDER — SODIUM CHLORIDE 0.9 % IV SOLN
INTRAVENOUS | Status: AC
  Administered 2015-08-29: 04:00:00 via INTRAVENOUS

## 2015-08-29 MED ORDER — LATANOPROST 0.005 % OP SOLN
1.0000 [drp] | Freq: Every day | OPHTHALMIC | Status: DC
  Administered 2015-08-29 – 2015-09-04 (×7): 1 [drp] via OPHTHALMIC
  Filled 2015-08-29 (×3): qty 2.5

## 2015-08-29 MED ORDER — LEVOTHYROXINE SODIUM 25 MCG PO TABS
125.0000 ug | ORAL_TABLET | Freq: Every day | ORAL | Status: DC
  Administered 2015-08-29 – 2015-09-08 (×11): 125 ug via ORAL
  Filled 2015-08-29 (×12): qty 1

## 2015-08-29 MED ORDER — PANTOPRAZOLE SODIUM 40 MG PO TBEC
40.0000 mg | DELAYED_RELEASE_TABLET | Freq: Every day | ORAL | Status: DC
  Administered 2015-08-29 – 2015-09-08 (×11): 40 mg via ORAL
  Filled 2015-08-29 (×11): qty 1

## 2015-08-29 MED ORDER — ADULT MULTIVITAMIN W/MINERALS CH
1.0000 | ORAL_TABLET | Freq: Every day | ORAL | Status: DC
  Administered 2015-08-29 – 2015-09-08 (×11): 1 via ORAL
  Filled 2015-08-29 (×11): qty 1

## 2015-08-29 MED ORDER — INSULIN ASPART 100 UNIT/ML ~~LOC~~ SOLN
0.0000 [IU] | Freq: Three times a day (TID) | SUBCUTANEOUS | Status: DC
Start: 2015-08-29 — End: 2015-09-08
  Administered 2015-08-29: 1 [IU] via SUBCUTANEOUS
  Administered 2015-08-30: 2 [IU] via SUBCUTANEOUS
  Administered 2015-08-30: 3 [IU] via SUBCUTANEOUS
  Administered 2015-08-30: 2 [IU] via SUBCUTANEOUS
  Administered 2015-08-31 (×2): 1 [IU] via SUBCUTANEOUS
  Administered 2015-08-31: 2 [IU] via SUBCUTANEOUS
  Administered 2015-09-01: 3 [IU] via SUBCUTANEOUS
  Administered 2015-09-01 – 2015-09-02 (×4): 2 [IU] via SUBCUTANEOUS
  Administered 2015-09-02: 1 [IU] via SUBCUTANEOUS
  Administered 2015-09-03: 3 [IU] via SUBCUTANEOUS
  Administered 2015-09-04 (×2): 2 [IU] via SUBCUTANEOUS
  Administered 2015-09-04: 1 [IU] via SUBCUTANEOUS
  Administered 2015-09-05 – 2015-09-06 (×4): 2 [IU] via SUBCUTANEOUS
  Administered 2015-09-06 (×2): 1 [IU] via SUBCUTANEOUS
  Administered 2015-09-07: 2 [IU] via SUBCUTANEOUS
  Administered 2015-09-07 – 2015-09-08 (×3): 1 [IU] via SUBCUTANEOUS
  Administered 2015-09-08: 3 [IU] via SUBCUTANEOUS
  Administered 2015-09-08: 1 [IU] via SUBCUTANEOUS

## 2015-08-29 MED ORDER — CLOPIDOGREL BISULFATE 75 MG PO TABS
75.0000 mg | ORAL_TABLET | Freq: Every morning | ORAL | Status: DC
  Filled 2015-08-29: qty 1

## 2015-08-29 MED ORDER — ALBUTEROL SULFATE HFA 108 (90 BASE) MCG/ACT IN AERS: 2.0000 | INHALATION_SPRAY | Freq: Four times a day (QID) | RESPIRATORY_TRACT | Status: DC | PRN

## 2015-08-29 MED ORDER — ALBUTEROL SULFATE (2.5 MG/3ML) 0.083% IN NEBU: 2.5000 mg | INHALATION_SOLUTION | Freq: Four times a day (QID) | RESPIRATORY_TRACT | Status: DC | PRN

## 2015-08-29 MED ORDER — ASPIRIN 81 MG PO CHEW
81.0000 mg | CHEWABLE_TABLET | Freq: Every day | ORAL | Status: DC
  Administered 2015-08-29 – 2015-09-07 (×10): 81 mg via ORAL
  Filled 2015-08-29 (×10): qty 1

## 2015-08-29 MED ORDER — GLUCERNA SHAKE PO LIQD
237.0000 mL | Freq: Three times a day (TID) | ORAL | Status: DC
  Administered 2015-08-29 – 2015-09-08 (×9): 237 mL via ORAL
  Filled 2015-08-29: qty 237

## 2015-08-29 NOTE — Progress Notes (Signed)
Pt tx to 2 [place] per MD order, pt verblized understanding of tx, pt updated family, pt received 1 unit of insulin and ate meal prior to tx, report called to receiving RN all questions answered

## 2015-08-29 NOTE — Progress Notes (Addendum)
Patient ID: [name] [name], female   DOB: [birth year], 75 y.o.   MRN: [id number]  PROGRESS NOTE    [name] [name]  [id number] DOB: Jul 20, 1940 DOA: 08/28/2015  PCP: [name], MD   Brief Narrative:  75 y.o. Female with past medical history hypertension, diabetes, dyslipidemia, chronic kidney disease stage IV. She presented to Dr. Ronnie Derby office because of inability to walk. She was found to have low blood pressure and liver function enzymes were elevated. She was sent to ER for further evaluation.  In ED, patient was hemodynamically stable. Blood work was notable for leukocytosis of 12.5, hemoglobin 8.1, platelets 405, creatinine 2.48. Abdominal ultrasound showed cholelithiasis without sonographic evidence of acute cholecystitis. She has mild right hydronephrosis with ureteral stent and then severe left hydronephrosis. MRI of thoracic spine did not show spinal stenosis despite chronic T12 compression fracture. 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 She was also found to have 2 right-sided pulmonary nodules 8 mm and 5 mm and CT at 3-6 months is recommended for follow-up and if the nodules are stable at the time of repeat and further CT scan is suggested at 18-24 months for low risk patients option of but recommended for high-risk patients. 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 In regards to cholelithiasis, GI has seen her in consultation. Patient needs to be off of Plavix for 5 days prior to ERCP.   Assessment & Plan:   Hypotension with weakness  - Likely because of hypotension  - Blood pressure stable this morning, 112/58  - No evidence of spinal stenosis in the thoracic spine however she does probably have some stenosis in cervical spine  - PT evaluation pending  - TSH within normal limits - Troponin level normal - Holding anti-hypertensive  medications - Transfer to telemetry floor today  Cholelithiasis - No sonographic evidence of acute cholecystitis - GI has seen her in consultation - AST is 195, ALT 214, ALP 380 and normal bilirubin - Follow-up daily CMP - Acute hepatitis panel negative  Urinary tract infection / leukocytosis - Large leukocytes on admission with many bacteria - Continue Ancef  - Follow up urine culture results   Hypothyroidism - Continue Synthroid - TSH within normal limits on this admission  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 She was on Bactrim which could have exacerbated kidney function. Bactrim on hold - She also has urinary tract infection which could've contributed to worsening renal function - Continue to monitor renal function daily   DVT prophylaxis: SCDs bilaterally  Code Status: full code  Family Communication: No family at the bedside  Disposition Plan: Transfer to telemetry floor   Consultants:   GI  Procedures:   None   Antimicrobials:   Cefazolin    Subjective: No overnight events.   Objective: Filed Vitals:   08/29/15 0700 08/29/15 0756 08/29/15 0800 08/29/15 1124  BP: 111/46 103/47 87/68 112/58  Pulse: 78 86 87 78  Temp:  100 F (37.8 C)  97.9 F (36.6 C)  TempSrc:  Axillary  Oral  Resp: 21 24 22  22  Height:      Weight:      SpO2: 100% 96% 98% 99%    Intake/Output Summary (Last 24 hours) at 08/29/15 1204 Last data filed at 08/29/15 0914  Gross per 24 hour  Intake    295 ml  Output    200 ml  Net     95 ml   Filed Weights   08/29/15 0600  Weight: 60.555 kg (133 lb 8 oz)    Examination:  General exam: Appears calm and  comfortable  Respiratory system: Clear to auscultation. Respiratory effort normal. Cardiovascular system: S1 & S2 heard, RRR.  Gastrointestinal system: Abdomen is nondistended, soft and nontender. No organomegaly or masses felt. Normal bowel sounds heard. Central nervous system: Alert and oriented. No focal neurological deficits. Extremities: Symmetric 5 x 5 power. Skin: No rashes, lesions or ulcers Psychiatry: Judgement and insight appear normal. Mood & affect appropriate.   Data Reviewed: I have personally reviewed following labs and imaging studies  CBC:  Recent Labs Lab 08/27/15 0841 08/28/15 1825 08/28/15 1834 08/29/15 0409  WBC 10.2 12.5*  --  9.3  NEUTROABS 6.8 10.2*  --  7.7  HGB 8.6 Repeated and verified X2.* 8.1* 8.8* 7.5*  HCT 26.9* 26.8* 26.0* 25.3*  MCV 79.7 84.3  --  81.9  PLT 407.0* 405*  --  99991111   Basic Metabolic Panel:  Recent Labs Lab 08/27/15 0841 08/28/15 1825 08/28/15 1834 08/29/15 0409  NA 138 137 140 135  K 3.5 3.6 3.6 3.5  CL 108 111 110 112*  CO2 19 16*  --  17*  GLUCOSE 110* 115* 110* 194*  BUN 30* 32* 29* 31*  CREATININE 1.90* 2.48* 2.30* 2.27*  CALCIUM 8.6 8.0*  --  7.7*   GFR: Estimated Creatinine Clearance: 20.4 mL/min (by C-G formula based on Cr of 2.27). Liver Function Tests:  Recent Labs Lab 08/27/15 0841 08/28/15 1825 08/29/15 0409  AST 96* 396* 195*  ALT 154* 286* 214*  ALKPHOS 337* 457* 380*  BILITOT 0.5 0.9 0.5  PROT 6.1 5.8* 5.3*  ALBUMIN 2.7* 2.1* 1.8*   No results for input(s): LIPASE, AMYLASE in the last 168 hours. No results for input(s): AMMONIA in the last 168 hours. Coagulation Profile:  Recent Labs Lab 08/28/15 1825  INR 1.40   Cardiac Enzymes:  Recent Labs Lab 08/29/15 0409 08/29/15 1105  CKTOTAL 36*  --   CKMB 2.1  --   TROPONINI <0.03 <0.03   BNP (last 3 results) No results for input(s): PROBNP in the last 8760 hours. HbA1C: No results for input(s): HGBA1C in the last 72  hours. CBG:  Recent Labs Lab 08/29/15 0617 08/29/15 0809  GLUCAP 175* 134*   Lipid Profile: No results for input(s): CHOL, HDL, LDLCALC, TRIG, CHOLHDL, LDLDIRECT in the last 72 hours. Thyroid Function Tests:  Recent Labs  08/29/15 0409  TSH 0.496   Anemia Panel:  Recent Labs  08/27/15 0841  IRON 8*   Urine analysis:    Component Value Date/Time   COLORURINE YELLOW 08/28/2015 1812   APPEARANCEUR TURBID* 08/28/2015 1812   LABSPEC 1.021 08/28/2015 1812   PHURINE 5.5 08/28/2015 1812   GLUCOSEU NEGATIVE 08/28/2015 1812   GLUCOSEU NEGATIVE 07/03/2015 1352   HGBUR LARGE* 08/28/2015 1812   BILIRUBINUR NEGATIVE 08/28/2015 1812   KETONESUR 15* 08/28/2015 1812   PROTEINUR >300* 08/28/2015 1812   UROBILINOGEN 0.2 07/03/2015 1352   NITRITE NEGATIVE 08/28/2015 1812   LEUKOCYTESUR LARGE* 08/28/2015 1812   Sepsis Labs: @LABRCNTIP (procalcitonin:4,lacticidven:4)  Recent Results (from the past 240 hour(s))  MRSA PCR Screening     Status: None   Collection Time: 08/29/15  6:11 AM  Result Value Ref Range Status   MRSA by PCR NEGATIVE NEGATIVE Final      Radiology Studies: Ct Chest Wo Contrast 08/29/2015   1. 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 The aorta measures 3.8 cm at the diaphragm. It is normal in caliber beginning at the level of the superior mesenteric artery. Recommend annual imaging followup by MRA or CTA, as deemed clinically appropriate. 2. Diffuse coronary artery calcifications seen. 3. Scattered calcification along the thoracic and proximal abdominal aorta, with calcification at the origin of the superior mesenteric artery. 4. Prominent high density at the left lung base reflects prior talc pleurodesis, with mild associated scarring. 5. Trace right-sided pleural fluid noted. 6. Two right-sided pulmonary nodules noted, measuring 8 mm and 5 mm. Non-contrast  chest CT at 3-6 months is recommended. If the nodules are stable at time of repeat CT, then future CT at 18-24 months (from today's scan) is considered optional for low-risk patients, but is recommended for high-risk patients. 7. Cholelithiasis.  Gallbladder otherwise unremarkable. 8. Severe chronic left-sided hydronephrosis, with severe chronic left renal atrophy. Minimal right renal atrophy suggested, new from the prior study. 9. Chronic compression deformity involving vertebral body T12.  Mr Brain Wo Contrast 08/29/2015  1. No acute intracranial infarct or other process identified. 2. 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 Additional scattered remote small vessel lacunar infarcts. 3. Abnormal flow voids within the right ICA and distal left vertebral artery, which may be related to slow flow and/or occlusion. 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 Patchy slow flow was also present within the left vertebral artery at that time as well. 4. Age-related cerebral atrophy with moderate chronic small vessel ischemic disease. Electronically Signed   By: Jeannine Boga M.D.   On: 08/29/2015 01:31   Mr Thoracic Spine Wo Contrast 08/28/2015  1. No thoracic spinal stenosis, despite a chronic T12 compression fracture. Very mild for age thoracic spine degenerative changes. 2. Advanced cervical spine degeneration suspected with probable cervical spinal stenosis. Cervical spine MRI would evaluate further. 3. 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 The descending thoracic aortic aneurysm diameters estimated at 4.3 cm on this exam  Electronically Signed   By: Genevie Ann M.D.   On: 08/28/2015 20:23   Mr Lumbar Spine Wo Contrast 08/28/2015   1. Cholelithiasis AND Choledocholithiasis with dilated CBD up to 16 mm. See series 6, images 4 through 13. Recommend Gastroenterology consultation. 2. Fairly mild for age lumbar spine degeneration. Isolated moderate lumbar spinal stenosis at L4-L5 related to disc and posterior element degeneration. No acute osseous abnormality. 3. 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 There appears to be a right side ureteral stent in place. 4. See also thoracic spine MRI findings from today reported separately. Electronically Signed   By: Genevie Ann M.D.   On: 08/28/2015 20:39   US Abdomen Complete 08/29/2015   Cholelithiasis without sonographic  evidence of acute cholecystitis. Mild right hydronephrosis. A ureteral stent is partially visualized in the right renal pelvis. Severe left hydronephrosis with parenchymal atrophy and cortical thinning of the left kidney. 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 CT may provide better evaluation of the aorta.    Scheduled Meds: . aspirin  81 mg Oral QHS  .  ceFAZolin (ANCEF) IV  500 mg Intravenous Q12H  . clopidogrel  75 mg Oral q morning - 10a  . insulin aspart  0-9 Units Subcutaneous TID WC  . latanoprost  1 drop Both Eyes QHS  . levothyroxine  125 mcg Oral QAC breakfast  . pantoprazole  40 mg Oral Daily  . sodium chloride flush  3 mL Intravenous Q12H   Continuous Infusions: . sodium chloride 75 mL/hr at 08/29/15 0404     LOS: 1 day    Time spent: 25 minutes  Greater than 50% of the time spent on counseling and coordinating the care.   Leisa Lenz, MD [facility] Pager [phone]  If 7PM-7AM, please contact night-coverage [email or web address] Password [facility] 08/29/2015, 12:04 PM

## 2015-08-29 NOTE — Progress Notes (Signed)
  Echocardiogram 2D Echocardiogram has been performed.  Bobbye Charleston 08/29/2015, 2:43 PM

## 2015-08-29 NOTE — Care Management Important Message (Signed)
Important Message  Patient Details  Name: FRANCISCA HIERRO MRN: [id number] Date of Birth: 1940/10/09   Medicare Important Message Given:  Yes    Loann Quill 08/29/2015, 9:44 AM

## 2015-08-29 NOTE — Consult Note (Signed)
[name] [name]: 9:37 AM 08/29/2015  LOS: 1 day    Referring Provider: Dr Charlies Silvers  Primary Care Physician:  [name], MD Primary Gastroenterologist:  Dr. Benson Norway and Collene Mares inpt 2011     Reason for Consultation:  Normocytic anemia.  Elevated LFTs.    HPI: [name] [name] is a 75 y.o. female.  Stage 4 CKD.  Chronic hydronephrosis, s/p multiple cystoscopies and ureteral stent placements.  Ischemic CM, CHF ef 50 to 55%.  AAA, CAD.  DM 2 on insulin.  On Plavix. On Iron for long hx of IDA.  PAD: LE and carotds; s/p bil CEA, s/p right fem-pop, aorto-bifemoral BPG.  S/p CABG 1997.  Chronic back pain.   11/2009 EGD for N/V: changes suggestive of distal reflux esophagitis, duodenitis,  Pathology: acute non chronic duodenitis.  No villous atrophy, no H pylori. Acute esophageal inflamation, no intestinal metaplasia etc.  11/2009 Colonoscopy, for iron def anemia and cancer screening.  Scattered tics throughout.  Poor prep but ICV visualized.  Suggested repeat study within 6 months with better prep.   At least 3 months of anorexia and ~ 20# weight loss.  No abd pain.  3 weeks of post -prandial n/v, non-bloody.  No dysphagia, no pyrosis.  Stable 2 to 3 x weekly BMs, not black/bloody/tarry.  In past po iron was reduced to qod by Dr Dwyane Dee but with 2 to 3 weeks of progressive fatigue she self- upped the dose to BID about a week ago. 6/22 admission with episode of legs felling numb, like they had fallen asleep (after driving) and was unable to move her legs to walk.  This resolved and she was able to walk into PMD office.  + hypotension. Labs show AKI on top of CKD.   + normocytic anemia noted.  Hgb drift from 9.9... 8.1... 7.5 in last 3 days.  Iron, Iron sats and transferrin levels all low.  FOBT negative.  Alk phs 337 to 457.  T  bil normal.  AST/ALT:96/154... 396/286... 195/214 in last 3 days. Baseline of 10 to 12.6 in last quarter of 2016.   APAP level not elevated.  INR normal, PT 17.3.    Spinal MRI: Cholelithiasis and Choledocholithiasis.  CBD up to 16 mm. Mild lumbar spinal DJD.  Lumbar stenosis.   Mm.  Severe obstructive renal disease greater on the left.  There appears to be a right side ureteral stent in place.  Chronic T12 compression fx.  Mild thoracic DJD.  Cervical spinal stenosis.  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 .  Ultrasound abdomen:  Cholelithiasis, 12 mm CBD Left>>>  right hydronephrosis,  3.3 cm AAA. CT scan chest: mild increase to 4.2 cm of thoracic AA.  L lung density is from talc pleurodesis.  8 and 5 mm  Right lung nodules, needs surveillance CT in 3 to 6 months.  Chronic changes of hydro and atrophy in left kidney, new right renal atrophy.  T12 compr fx is chronic.     Past Medical History  Diagnosis Date  .  CHF (congestive heart failure) ([place])   . Hypertension   . Hyperlipidemia   . Hypothyroidism   . Ischemic cardiomyopathy     03-25-2010-- per lasts echo EF  50-55%  . Coronary artery disease due to lipid rich plaque cardiologist-  dr berry    s/p CABG x6 1997-- cath 12-09-2009 occluded vein to obtuse marginal branch and ramus branch with patent vien to PDA and patent LIMA to LAD, ef 40%-- Myoview 11-24-2011, nonischemic  . Type 2 diabetes mellitus ([place])     monitored by dr Dwyane Dee  . Retroperitoneal fibrosis   . Bilateral hydronephrosis   . CKD (chronic kidney disease), stage III   . History of sepsis     10-18-2014 w/ acute pyelonephritis  . Dyspnea on exertion   . GERD (gastroesophageal reflux disease)   . PVD (peripheral vascular disease) with claudication ([place])     last duplex 07-04-2015 -- Right CCA and ICA chronic occlusion, 67-67% LICA, Patent vertebral arteries w/ antegrade flow, bilateral normal subclavian arteries   .  Carotid artery stenosis     carotid doppler 06/2012 - Right CCA/Bulb/ICA with chronic occlusion; L vertebral artery with abnormal blood flow; L Bulb/Prox ICA  s/p endarterectomy with mild fibrous plaque, 50% diameter reduction  . PAD (peripheral artery disease) ([place])     09/2010 LEAs - R ABI of 0.45, occluded fem-pop bypass graft, L ABI of 0.59 with occluded SFA; severe arterial insuff    Past Surgical History  Procedure Laterality Date  . Cystoscopy w/ ureteral stent placement  03/10/2012    Procedure: CYSTOSCOPY WITH RETROGRADE PYELOGRAM/URETERAL STENT PLACEMENT;  Surgeon: Hanley Ben, MD;  Location: [facility];  Service: Urology;  Laterality: Left;  . Carotid endarterectomy Bilateral right 1994//  left ?  Marland Kitchen Transthoracic echocardiogram  03/25/2010    EF 20-94%, LV systolic function low normal with mild inferoseptal hypocontractility; LA mildly dilated; mod MR; mild TR, RV systolic pressure elevated, mild pulm HTN; AV mildly sclerotic; mild pulm valve regurg; aortic root sclerosis/calcif   . Coronary artery bypass graft  1997    x6; internal mammary to LAD, SVG to ramus #1 & #2, SVG to OM, SVG to PDA,   . Cardiac catheterization  12-09-2009  dr al little    EF >40%-- occluded vein to OM & ramus branches, patent vein to PDA, patent LIMA to LAD (Dr. [name], [facility]) - later had thrombectomy of R fem-pop bypass ad R common femoral & profunda femoris artery (Dr. Oneida Alar)  . Repair right femoral pseudoaneuysm/  right fem-pop bypass graft/  debridement right lower extremitiy venous status ulcers x2  01-12-2005  . Aorta - bilateral femoral artery bypass graft  1997    and RIGHT FEM-POP   . Cardiovascular stress test  11-24-2011   dr berry    Low Risk study: fixed basal to mid inferior attenuation artifact, no reversible ischemia,  normal LV function and wall motion , ef 67%  . Total abdominal hysterectomy w/ bilateral salpingoophorectomy  1986  . Cataract extraction w/ intraocular lens  implant, bilateral   2006  . Cystoscopy w/ ureteral stent placement Bilateral 03/07/2015    Procedure: BILATERAL RETROGRADE PYELOGRAM AND RIGHT URETERAL STENT PLACEMENT;  Surgeon: Ardis Hughs, MD;  Location: [facility];  Service: Urology;  Laterality: Bilateral;  . Cystoscopy w/ ureteral stent placement Right 08/01/2015    Procedure: CYSTOSCOPY WITH STENT REPLACEMENT;  Surgeon: Ardis Hughs, MD;  Location[facility];  Service: Urology;  Laterality: Right;  .  Cystoscopy w/ retrogrades Right 08/01/2015    Procedure: CYSTOSCOPY WITH RETROGRADE PYELOGRAM;  Surgeon: Ardis Hughs, MD;  Location: [facility];  Service: Urology;  Laterality: Right;    Prior to Admission medications   Medication Sig Start Date End Date Taking? Authorizing Provider  albuterol (PROVENTIL HFA;VENTOLIN HFA) 108 (90 BASE) MCG/ACT inhaler Inhale 2 puffs into the lungs every 6 (six) hours as needed for wheezing. 02/22/14  Yes [name], MD  aspirin 81 MG chewable tablet Chew 81 mg by mouth at bedtime.    Yes Historical Provider, MD  calcium-vitamin D (OSCAL WITH D) 500-200 MG-UNIT per tablet Take 1 tablet by mouth every morning.   Yes Historical Provider, MD  clopidogrel (PLAVIX) 75 MG tablet TAKE 1 TABLET EVERY MORNING 06/30/15  Yes [name], MD  Cranberry 250 MG TABS Take 1 tablet by mouth daily.    Yes Historical Provider, MD  CRESTOR 20 MG tablet take 1 tablet by mouth once daily 06/05/15  Yes [name], MD  ferrous sulfate 325 (65 FE) MG tablet Take 325 mg by mouth 3 (three) times a week.    Yes Historical Provider, MD  HYDROcodone-acetaminophen (NORCO/VICODIN) 5-325 MG tablet Take 1 tablet every 6 hours as needed for back pain 08/27/15  Yes [name], MD  insulin aspart (NOVOLOG) 100 UNIT/ML FlexPen Inject 8 units three times a day with meals 09/30/14  Yes [name], MD  Insulin Detemir (LEVEMIR FLEXTOUCH) 100 UNIT/ML Pen Inject 22 units at bedtime. Patient taking differently:  Inject 18 Units into the skin at bedtime. Inject 20 units at bedtime. 01/13/15  Yes [name], MD  lactobacillus acidophilus (BACID) TABS tablet Take 2 tablets by mouth 3 (three) times daily.   Yes Historical Provider, MD  latanoprost (XALATAN) 0.005 % ophthalmic solution Place 1 drop into both eyes at bedtime. 03/11/14  Yes Historical Provider, MD  levothyroxine (SYNTHROID, LEVOTHROID) 125 MCG tablet TAKE 1 TABLET DAILY Patient taking differently: TAKE 1 TABLET DAILY---  takes in am takes only 1/2 tablet on Saturday 02/12/15  Yes [name], MD  metoprolol succinate (TOPROL-XL) 25 MG 24 hr tablet TAKE 1/2 TABLET (=12.5MG)  EVERY MORNING 06/24/15  Yes [name], MD  omeprazole (PRILOSEC) 20 MG capsule TAKE 1 CAPSULE DAILY Patient taking differently: TAKE 1 CAPSULE DAILY--  takes in am 02/12/15  Yes [name], MD  vitamin C (ASCORBIC ACID) 500 MG tablet Take 500 mg by mouth every morning.   Yes Historical Provider, MD  Vitamin D, Ergocalciferol, (DRISDOL) 50000 units CAPS capsule TAKE 1 CAPSULE BY MOUTH EVERY 7 DAYS ON THURSDAYS 08/19/15  Yes [name], MD  furosemide (LASIX) 20 MG tablet take 1 tablet by mouth once daily Patient taking differently: take 1 tablet by mouth once prn 12/25/14   [name], MD  metoCLOPramide (REGLAN) 5 MG tablet Take 1 tablet (5 mg total) by mouth 4 (four) times daily. 07/03/15   [name], MD  phenazopyridine (PYRIDIUM) 200 MG tablet Take 1 tablet (200 mg total) by mouth 3 (three) times daily as needed for pain. 08/01/15   Ardis Hughs, MD    Scheduled Meds: . aspirin  81 mg Oral QHS  .  ceFAZolin (ANCEF) IV  500 mg Intravenous Q12H  . clopidogrel  75 mg Oral q morning - 10a  . insulin aspart  0-9 Units Subcutaneous TID WC  . latanoprost  1 drop Both Eyes QHS  . levothyroxine  125 mcg Oral QAC breakfast  . pantoprazole  40 mg Oral Daily  . sodium chloride flush  3 mL Intravenous Q12H   Infusions: . sodium chloride 75 mL/hr at 08/29/15 0404   PRN  Meds: albuterol   Allergies as of 08/28/2015  . (No Known Allergies)    Family History  Problem Relation Age of Onset  . Congestive Heart Failure Mother   . Diabetes Mother   . Stroke Father   . Cancer Maternal Aunt     Breast cancer    Social History   Social History  . Marital Status: Divorced    Spouse Name: N/A  . Number of Children: 1  . Years of Education: 12   Occupational History  . Not on file.   Social History Main Topics  . Smoking status: Former Smoker -- 2.00 packs/day for 30 years    Quit date: 06/07/2009  . Smokeless tobacco: Never Used  . Alcohol Use: 1.8 oz/week    3 Standard drinks or equivalent per week     Comment: occasuional  . Drug Use: No  . Sexual Activity: Not on file   Other Topics Concern  . Not on file   Social History Narrative    REVIEW OF SYSTEMS: Constitutional:  Per HPI.  + weakness ENT:  No nose bleeds Pulm:  No new SOB .  No cough CV:  No palpitations, no LE edema. No chest pain.  GU:  Urine cloudy and dark colored.  Drank 3 beers (unusual for her) a couple of nights ago when she was unable to void completely (her moms remedy, and it worked) No hematuria, no frequency GI:  Per HPI Heme:  Previous parenteral iron infusion a few years back   Transfusions:  Does not recall transfusions and none in Epic records.  Neuro:  Per HPI.  At baseline uses cane to ambulate.  Wheelchair only for long distances.  Derm:  No itching, no rash or sores.  Endocrine:  No sweats or chills.  No polyuria or dysuria Immunization:  Not queried.  Travel:  None beyond local counties in last few months.    PHYSICAL EXAM: Vital signs in last 24 hours: Filed Vitals:   08/29/15 0756 08/29/15 0800  BP: 103/47 87/68  Pulse: 86 87  Temp: 100 F (37.8 C)   Resp: 24 22   Wt Readings from Last 3 Encounters:  08/29/15 60.555 kg (133 lb 8 oz)  08/28/15 62.143 kg (137 lb)  08/01/15 62.143 kg (137 lb)    General: pleasant, alert, pale.  Somewhat  frail.  Comfortable. Head:  No asymmetry or swelling  Eyes:  + conj pallor.  No icterus Ears:  Some HOH  Nose:  No discharge or congestion Mouth:  Clear, somewhat dry MM.  No exudates.  Tongue midline Neck:  No JVD.  noTMG Lungs:  Clear bil.  No cough or dyspnea Heart: RRR.  No mrg.  S1/S2 present.  Abdomen:  Soft, NT, ND.   No HSM.  Long RUQ scar (looks like chole scar but GB is intact), long midline scar.  + ventral/incisional hernia.  BS active.  Not tender but urge to urinate with applied pressure.  .   Rectal: deferred.  FOBT negative 6/22.    Musc/Skeltl: no joint redness or swelling.  + kyphosis.   Extremities:  No CCE.  Feet warm  Neurologic:  Alert, oriented x 3.  No limb weakness or tremor.  No gross deficits.  Good historian Skin:  No telangectasia or significant bruising.  Psych:  Pleasant,  calm, not depressed.   Intake/Output from previous day: 06/22 0701 - 06/23 0700 In: 220 [I.V.:220] Out: 100 [Urine:100] Intake/Output this shift: Total I/O In: 75 [I.V.:75] Out: 100 [Urine:100]  LAB RESULTS:  Recent Labs  08/27/15 0841 08/28/15 1825 08/28/15 1834 08/29/15 0409  WBC 10.2 12.5*  --  9.3  HGB 8.6 Repeated and verified X2.* 8.1* 8.8* 7.5*  HCT 26.9* 26.8* 26.0* 25.3*  PLT 407.0* 405*  --  339   BMET Lab Results  Component Value Date   NA 135 08/29/2015   NA 140 08/28/2015   NA 137 08/28/2015   K 3.5 08/29/2015   K 3.6 08/28/2015   K 3.6 08/28/2015   CL 112* 08/29/2015   CL 110 08/28/2015   CL 111 08/28/2015   CO2 17* 08/29/2015   CO2 16* 08/28/2015   CO2 19 08/27/2015   GLUCOSE 194* 08/29/2015   GLUCOSE 110* 08/28/2015   GLUCOSE 115* 08/28/2015   BUN 31* 08/29/2015   BUN 29* 08/28/2015   BUN 32* 08/28/2015   CREATININE 2.27* 08/29/2015   CREATININE 2.30* 08/28/2015   CREATININE 2.48* 08/28/2015   CALCIUM 7.7* 08/29/2015   CALCIUM 8.0* 08/28/2015   CALCIUM 8.6 08/27/2015   LFT  Recent Labs  08/27/15 0841 08/28/15 1825 08/29/15 0409   PROT 6.1 5.8* 5.3*  ALBUMIN 2.7* 2.1* 1.8*  AST 96* 396* 195*  ALT 154* 286* 214*  ALKPHOS 337* 457* 380*  BILITOT 0.5 0.9 0.5   PT/INR Lab Results  Component Value Date   INR 1.40 08/28/2015   INR 0.97 05/07/2012   INR 1.11 03/10/2012   Hepatitis Panel No results for input(s): HEPBSAG, HCVAB, HEPAIGM, HEPBIGM in the last 72 hours. C-Diff No components found for: CDIFF Lipase     Component Value Date/Time   LIPASE 25 12/04/2009 1820    Drugs of Abuse     Component Value Date/Time   LABOPIA POSITIVE* 08/28/2015 1831   COCAINSCRNUR NONE DETECTED 08/28/2015 1831   LABBENZ NONE DETECTED 08/28/2015 1831   AMPHETMU NONE DETECTED 08/28/2015 1831   THCU NONE DETECTED 08/28/2015 1831   LABBARB NONE DETECTED 08/28/2015 1831     RADIOLOGY STUDIES: Ct Chest Wo Contrast  08/29/2015  CLINICAL DATA:  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 Further evaluation requested. Initial encounter. EXAM: CT CHEST WITHOUT CONTRAST TECHNIQUE: Multidetector CT imaging of the chest was performed following the standard protocol without IV contrast. COMPARISON:  Chest radiograph from 04/06/2012, CT of the abdomen and pelvis from 03/08/2012, and MRI of the thoracic spine performed earlier today at 7:27 p.m. 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 This progresses to the level of the proximal abdominal aorta, where the aorta measures 3.8 cm at the diaphragm. It is normal in caliber starting at the level of the superior mesenteric artery. The ascending thoracic aorta and aortic arch are grossly unremarkable, aside from scattered calcification along the thoracic abdominal aorta. Calcification is noted at the origin of the superior mesenteric artery. Diffuse coronary artery calcifications  are seen. Mediastinum: The mediastinum is otherwise unremarkable in appearance. No mediastinal lymphadenopathy is seen. No pericardial effusion is identified. Scattered calcification is noted along the proximal great vessels. The patient is status post median sternotomy. The visualized portions of thyroid gland are grossly unremarkable. No axillary lymphadenopathy is seen.  Lungs/Pleura: Prominent high density at the left lung base reflects prior talc pleurodesis, with left-sided extrapleural fat again noted. Mild associated scarring is seen. Trace right-sided pleural fluid is noted. A 8 mm nodule is noted at the right lower lobe (image 96 of 164), while a 5 mm pleural-based nodule is noted at the right upper lobe (image 40 of 164). The right lung is otherwise clear. Upper Abdomen: The visualized portions of the liver and spleen are grossly unremarkable. Stones are noted dependently within the gallbladder. Postoperative change is noted. There is severe chronic left-sided hydronephrosis, with severe chronic left renal atrophy. Minimal right-sided renal atrophy is suggested, new from the prior study. Musculoskeletal: There is chronic compression deformity involving vertebral body T12. Degenerative change is noted at the lower cervical spine, with chronic osseous fusion noted at C7-T1. IMPRESSION: 1. 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 The aorta measures 3.8 cm at the diaphragm. It is normal in caliber beginning at the level of the superior mesenteric artery. Recommend annual imaging followup by MRA or CTA, as deemed clinically appropriate. 2. Diffuse coronary artery calcifications seen. 3. Scattered calcification along the thoracic and proximal abdominal aorta, with calcification at the origin of the superior mesenteric artery. 4. Prominent high density at the left lung base reflects prior talc  pleurodesis, with mild associated scarring. 5. Trace right-sided pleural fluid noted. 6. Two right-sided pulmonary nodules noted, measuring 8 mm and 5 mm. Non-contrast chest CT at 3-6 months is recommended. If the nodules are stable at time of repeat CT, then future CT at 18-24 months (from today's scan) is considered optional for low-risk patients, but is recommended for high-risk patients. This recommendation follows the consensus statement: Guidelines for Management of Incidental Pulmonary Nodules Detected on CT Images:From the Fleischner Society 2017; published online before print (10.1148/radiol.0254270623). 7. Cholelithiasis.  Gallbladder otherwise unremarkable. 8. Severe chronic left-sided hydronephrosis, with severe chronic left renal atrophy. Minimal right renal atrophy suggested, new from the prior study. 9. Chronic compression deformity involving vertebral body T12. Electronically Signed   By: Garald Balding M.D.   On: 08/29/2015 00:46   Mr Brain Wo Contrast  08/29/2015  CLINICAL DATA:  Initial evaluation for acute weakness. EXAM: MRI HEAD WITHOUT CONTRAST TECHNIQUE: Multiplanar, multiecho pulse sequences of the brain and surrounding structures were obtained without intravenous contrast. COMPARISON:  Prior CT from 06/07/2012 as well as arteriogram from 12/02/2004. FINDINGS: Mild diffuse prominence of the CSF containing spaces is compatible with generalized age-related cerebral atrophy. Patchy T2/FLAIR hyperintensity within the periventricular and deep white matter both cerebral hemispheres most consistent with chronic small vessel ischemic disease. Few scattered superimposed remote lacunar infarctions present. Probable small remote lacunar infarct within the left thalamus. Small remote infarcts within the left cerebellar hemisphere. Encephalomalacia within the left parietal and occipital lobes also consistent with remote infarcts. No abnormal foci of restricted diffusion to suggest acute infarct.  Gray-white matter differentiation maintained. There is an abnormal flow void within the right internal carotid artery, which may related to slow flow and/ or occlusion. 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 Abnormal flow void within the left vertebral artery is well, which may be related to slow flow and/or occlusion. Few scattered punctate foci susceptibility artifact present within the supratentorial and infratentorial brain, likely small chronic micro hemorrhages, most likely related a chronic underlying  hypertension. No mass lesion, midline shift, or mass effect. No hydrocephalus. No extra-axial fluid collection. Major dural sinuses are patent. Craniocervical junction within normal limits. Visualized upper cervical spine without acute abnormality para Pituitary gland within normal limits. No acute abnormality about the globes and orbits. Patient is status post bilateral lens extraction. Paranasal sinuses are clear. Trace opacity within left mastoid air cells. Inner ear structures normal. Bone marrow signal intensity within normal limits. No scalp soft tissue abnormality. IMPRESSION: 1. No acute intracranial infarct or other process identified. 2. 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 Additional scattered remote small vessel lacunar infarcts. 3. Abnormal flow voids within the right ICA and distal left vertebral artery, which may be related to slow flow and/or occlusion. 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 Patchy slow flow was also present within the left vertebral artery at that time as well. 4. Age-related cerebral atrophy with moderate chronic small vessel ischemic disease. Electronically Signed   By: Jeannine Boga M.D.   On:  08/29/2015 01:31   Mr Thoracic Spine Wo Contrast  08/28/2015  CLINICAL DATA:  75 year old female with 3 weeks of bilateral lower extremity weakness. Ataxia. Unintentional weight loss for 2 months. Anemia. Initial encounter. EXAM: MRI THORACIC SPINE WITHOUT CONTRAST TECHNIQUE: Multiplanar, multisequence MR imaging of the thoracic spine was performed. No intravenous contrast was administered. COMPARISON:  Chest radiographs 04/06/2013. Lumbar MRI from today reported separately. CT Abdomen and Pelvis 03/08/2012. FINDINGS: Limited sagittal imaging of the cervical spine suggests widespread advanced cervical spine disc and endplate degeneration (series 3, image 7). There is evidence of associated spinal stenosis, perhaps maximal at C3-C4. There is severe disc space loss at C7-T1 associated with mild spondylolisthesis and facet hypertrophy. No significant spinal stenosis results at this level. There is moderate to severe left C8 foraminal stenosis. There is a chronic mild to moderate T12 compression fracture. Elsewhere thoracic vertebral height and alignment is preserved. No marrow edema or evidence of acute osseous abnormality. The thoracic spinal canal is capacious. There are occasional small thoracic disc protrusions. At T11-T12 there is disc osteophyte complex - in part related to the mild T12 posterior superior endplate retropulsion - in addition to mild posterior element hypertrophy, but no significant thoracic spinal stenosis results. Spinal cord signal is within normal limits at all visualized levels. The conus medullaris occurs below the L1 level, see lumbar comparison from today. There is abnormal increased signal intensity in the left lower lobe (series 8, image 32), a within nonspecific appearance. There is aneurysmal enlargement of the descending thoracic aorta at the T9-T10 level measuring 4.3 cm with mural plaque or thrombus (series 8, image 30). There is also a small right lung pulmonary nodule on image  26. Negative visualized upper abdominal viscera. IMPRESSION: 1. No thoracic spinal stenosis, despite a chronic T12 compression fracture. Very mild for age thoracic spine degenerative changes. 2. Advanced cervical spine degeneration suspected with probable cervical spinal stenosis. Cervical spine MRI would evaluate further. 3. 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 The descending thoracic aortic aneurysm diameters estimated at 4.3 cm on this exam Electronically Signed   By: Genevie Ann M.D.   On: 08/28/2015 20:23  Mr Lumbar Spine Wo Contrast  08/28/2015  CLINICAL DATA:  75 year old female with 3 weeks of bilateral lower extremity weakness. Ataxia. Unintentional weight loss for 2 months. Anemia. Initial encounter. EXAM: MRI LUMBAR SPINE WITHOUT CONTRAST TECHNIQUE: Multiplanar, multisequence MR imaging of the lumbar spine was performed. No intravenous contrast was administered. COMPARISON:  Thoracic spine MRI from today reported separately. [facility] retrograde pyelogram 08/01/2015. Renal ultrasound 01/09/2015. CT Abdomen and Pelvis 03/08/2012 FINDINGS: Segmentation:  Normal Alignment: Chronic T12 compression fracture unchanged since 2014. Lumbar vertebral height and alignment is stable. There is mild retrolisthesis at L2-L3. Vertebrae: No marrow edema or evidence of acute osseous abnormality. Conus medullaris: Extends to the L1-L2 and appears normal. No lower thoracic spinal cord signal abnormality. Paraspinal and other soft tissues: Cholelithiasis. Furthermore, the CBD is dilated up to 16 mm diameter and there are low signal filling defects in the distal common bile duct as seen on series 6, image 10. These measure up to 10 mm diameter. The visible gallbladder does not appear  inflamed. Chronic severe left renal atrophy, left hydronephrosis, and left hydroureter. Fluid fluid levels in the left renal collecting system. There is evidence of a right ureteral stent. There is mild right and hydroureter which is new since 2014. Diverticulosis of the sigmoid colon is partially visible in the pelvis. Disc levels: Mild lumbar spine degeneration above the L4 vertebral level. No significant lumbar spinal stenosis at those levels. Also, there are no significant degenerative changes at L5-S1. L4-L5: Circumferential disc bulge with superimposed right paracentral broad-based disc protrusion. Superimposed moderate facet and ligament flavum hypertrophy with a blunted and facet joint fluid. Moderate spinal stenosis (series 6, image 26). Mild right greater than left L4 foraminal stenosis. IMPRESSION: 1. Cholelithiasis AND Choledocholithiasis with dilated CBD up to 16 mm. See series 6, images 4 through 13. Recommend Gastroenterology consultation. 2. Fairly mild for age lumbar spine degeneration. Isolated moderate lumbar spinal stenosis at L4-L5 related to disc and posterior element degeneration. No acute osseous abnormality. 3. 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 There appears to be a right side ureteral stent in place. 4. See also thoracic spine MRI findings from today reported separately. Electronically Signed   By: Genevie Ann M.D.   On: 08/28/2015 20:39   US Abdomen Complete  08/29/2015  CLINICAL DATA:  75 year old female with abnormal liver function. EXAM: ABDOMEN ULTRASOUND COMPLETE COMPARISON:  Renal ultrasound dated 01/09/2015 and lumbar spine MRI dated 08/28/2015 FINDINGS: Gallbladder: There are multiple stones within the gallbladder. There is no gallbladder wall thickening or pericholecystic fluid. Negative sonographic Murphy's sign. Common bile duct: Diameter: Dilated measuring up to 12 mm in the midportion. Stones noted in the central CBD on the lumbar MRI. Liver: No  focal lesion identified. Within normal limits in parenchymal echogenicity. IVC: No abnormality visualized. Pancreas: Visualized portion unremarkable. Spleen: Size and appearance within normal limits. Right Kidney: Length: 9.6 cm. There is a 1.4 x 1 3 x 1.2 cm exophytic cyst from the anterior cortex of the right renal interpolar region. A right ureteral stent is partially visualized. There is mild right hydronephrosis. Left Kidney: Length: 11.9 cm. There is severe left hydronephrosis with left renal parenchymal atrophy and cortical thinning. There is diffuse increased left renal parenchymal echogenicity. Abdominal aorta: The aorta measures 3.3 cm in diameter. The distal aorta is not visualized. Other findings: There is layering debris within the urinary bladder. Correlation with urinalysis recommended to exclude UTI. IMPRESSION: Cholelithiasis without sonographic evidence of acute cholecystitis. Mild  right hydronephrosis. A ureteral stent is partially visualized in the right renal pelvis. Severe left hydronephrosis with parenchymal atrophy and cortical thinning of the left kidney. 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 CT may provide better evaluation of the aorta. Electronically Signed   By: Anner Crete M.D.   On: 08/29/2015 05:25    ENDOSCOPIC STUDIES: Per HPI  IMPRESSION:   *  Choledocholithiasis, cholelithiasis.  Signs sxs are elevated LFTs, 3 weeks of post prandial n/v and several months of weight loss and anorexia.   No abdominal pain.    *  Normocytic, FOBT negative anemia.  Longstanding IDA.   EGD with esophagitis, duodenitis in 2011.  Suboptimal but non-worrisome colonoscopy 2011.  Has AKI/CKD which are contributing to anemia.    *  Acute on chronic CKD with chronic hydro, previous ureteral stents.    *  ASPVD.  AAA.  S/p multiple vascular surgeries.  On Plavix, got this at 1050 today but now discontinued. .    *  Chronic back pain.   *  Temporary LE numbness and loss of function.   Resolved.     PLAN:     *  Per Dr Hilarie Fredrickson and Fuller Plan.   If ERCP pursued, will need to be done after 5 days off Plavix.  Last Plavix dose 6/23 , now discontinued.   May need EGD/Colonoscopy but not urgent as she is FOBT negative.    *  Carb mod diet.        Azucena Freed  08/29/2015, 9:37 AM Pager: [phone]

## 2015-08-29 NOTE — Progress Notes (Signed)
Patient lying in bed, no needs at this time. IV removed due to bleeding around site, iv team consult placed. Call light within reach.

## 2015-08-29 NOTE — Progress Notes (Addendum)
Initial Nutrition Assessment  DOCUMENTATION CODES:   Severe malnutrition in context of chronic illness  INTERVENTION:   -Glucerna Shake po TID, each supplement provides 220 kcal and 10 grams of protein -MVI daily  NUTRITION DIAGNOSIS:   Malnutrition related to chronic illness as evidenced by energy intake < or equal to 75% for > or equal to 1 month, severe depletion of body fat, severe depletion of muscle mass.  GOAL:   Patient will meet greater than or equal to 90% of their needs  MONITOR:   PO intake, Supplement acceptance, Labs, Weight trends, Skin, I & O's  REASON FOR ASSESSMENT:   Malnutrition Screening Tool    ASSESSMENT:   [name] [name] is a 75 y.o. female, w/ Dm2, CAD, AAA, CHF (EF 50-55%), CKD stage 4, Diverticulosis apparently was at Dr. Ronnie Derby office and could not walk and her bp was low and her liver function was abnormal along with her hemoglobin. Pt was sent to ER for evaluation of weakness in her bilateral legs , as well as numbness " like my legs went to sleep" Pt states numbness has disappeared. Pt has chronic back pain. Pt denies headache, vision change, dysarthria, fever, chills, n/v, abd pain, diarrhea, brbpr. Pt notes n/v x3 5 days ago.   Pt admitted with UTI and hypotension secondary to opoid use with impaired renal function.   Spoke with pt at bedside. She reports poor appetite over the past 1-2 months. During this time period, pt was consuming 2 meals per day (split a full meal of meat, starch, and vegetable from [employer] during these two time periods). Typical meal intake was 2 meals and an HS snacks prior to decline in appetite. Beverages consist mainly of water and cranberry juice "to help with my kidneys". Pt does not consume nutritional supplements at home. Pt did mention the she drank 3 beers one day PTA to help with urination, but does not usually consume alcohol.   Pt reports UBW is around 160#, but unable to provide time frame for weight loss.  Per wt hx, pt experienced a 22# (14%) wt loss over the past 6 months.   Nutrition-Focused physical exam completed. Findings are moderate to severe fat depletion, moderate to severe muscle depletion, and no edema.   Discussed importance of good meal and supplement intake to promote healing. Encouraged pt to consume small, frequent meals to optimize oral intake. Pt is amenable to Glucerna supplements.   Case discussed with RN. Pt is on sepsis protocol.   Labs reviewed.   Diet Order:  Diet Carb Modified Fluid consistency:: Thin; Room service appropriate?: Yes  Skin:  Wound (see comment) (stage II sacrum)  Last BM:  PTA  Height:   Ht Readings from Last 1 Encounters:  08/29/15 5\' 6"  (1.676 m)    Weight:   Wt Readings from Last 1 Encounters:  08/29/15 133 lb 8 oz (60.555 kg)    Ideal Body Weight:  59.1 kg  BMI:  Body mass index is 21.56 kg/(m^2).  Estimated Nutritional Needs:   Kcal:  1600-1800  Protein:  80-95 grams  Fluid:  >1.6 L  EDUCATION NEEDS:   Education needs addressed  Shaely Gadberry A. Jimmye Norman, RD, LDN, CDE Pager: [phone] After hours Pager: [phone]

## 2015-08-29 NOTE — ED Notes (Signed)
Patient transported to MRI 

## 2015-08-29 NOTE — ED Notes (Signed)
Patient transported to Ultrasound 

## 2015-08-30 DIAGNOSIS — R945 Abnormal results of liver function studies: Secondary | ICD-10-CM | POA: Insufficient documentation

## 2015-08-30 DIAGNOSIS — R110 Nausea: Secondary | ICD-10-CM | POA: Insufficient documentation

## 2015-08-30 DIAGNOSIS — K7689 Other specified diseases of liver: Secondary | ICD-10-CM

## 2015-08-30 DIAGNOSIS — K8050 Calculus of bile duct without cholangitis or cholecystitis without obstruction: Secondary | ICD-10-CM | POA: Insufficient documentation

## 2015-08-30 LAB — HEPATITIS PANEL, ACUTE
HEP A IGM: NEGATIVE
HEP B C IGM: NEGATIVE
Hepatitis B Surface Ag: NEGATIVE

## 2015-08-30 LAB — CBC
HEMATOCRIT: 24.2 % — AB (ref 36.0–46.0)
Hemoglobin: 7.3 g/dL — ABNORMAL LOW (ref 12.0–15.0)
MCH: 24.6 pg — AB (ref 26.0–34.0)
MCHC: 30.2 g/dL (ref 30.0–36.0)
MCV: 81.5 fL (ref 78.0–100.0)
PLATELETS: 284 10*3/uL (ref 150–400)
RBC: 2.97 MIL/uL — ABNORMAL LOW (ref 3.87–5.11)
RDW: 17.5 % — AB (ref 11.5–15.5)
WBC: 7.7 10*3/uL (ref 4.0–10.5)

## 2015-08-30 LAB — GLUCOSE, CAPILLARY
GLUCOSE-CAPILLARY: 185 mg/dL — AB (ref 65–99)
GLUCOSE-CAPILLARY: 147 mg/dL — AB (ref 65–99)
Glucose-Capillary: 155 mg/dL — ABNORMAL HIGH (ref 65–99)
Glucose-Capillary: 178 mg/dL — ABNORMAL HIGH (ref 65–99)
Glucose-Capillary: 216 mg/dL — ABNORMAL HIGH (ref 65–99)

## 2015-08-30 LAB — COMPREHENSIVE METABOLIC PANEL
ALK PHOS: 315 U/L — AB (ref 38–126)
ALT: 109 U/L — AB (ref 14–54)
AST: 55 U/L — AB (ref 15–41)
Albumin: 1.7 g/dL — ABNORMAL LOW (ref 3.5–5.0)
Anion gap: 7 (ref 5–15)
BILIRUBIN TOTAL: 0.3 mg/dL (ref 0.3–1.2)
BUN: 27 mg/dL — AB (ref 6–20)
CO2: 19 mmol/L — ABNORMAL LOW (ref 22–32)
CREATININE: 2.09 mg/dL — AB (ref 0.44–1.00)
Calcium: 7.6 mg/dL — ABNORMAL LOW (ref 8.9–10.3)
Chloride: 111 mmol/L (ref 101–111)
GFR, EST AFRICAN AMERICAN: 26 mL/min — AB (ref >60)
GFR, EST NON AFRICAN AMERICAN: 22 mL/min — AB (ref >60)
Glucose, Bld: 169 mg/dL — ABNORMAL HIGH (ref 65–99)
Potassium: 3.5 mmol/L (ref 3.5–5.1)
Sodium: 137 mmol/L (ref 135–145)
TOTAL PROTEIN: 4.9 g/dL — AB (ref 6.5–8.1)

## 2015-08-30 LAB — URINE CULTURE

## 2015-08-30 LAB — HEMOGLOBIN A1C
HEMOGLOBIN A1C: 5.6 % (ref 4.8–5.6)
Mean Plasma Glucose: 114 mg/dL

## 2015-08-30 LAB — PREPARE RBC (CROSSMATCH)

## 2015-08-30 LAB — SODIUM, URINE, RANDOM: Sodium, Ur: 68 mmol/L

## 2015-08-30 MED ORDER — TRAMADOL HCL 50 MG PO TABS
50.0000 mg | ORAL_TABLET | Freq: Once | ORAL | Status: AC
Start: 2015-08-30 — End: 2015-08-30
  Administered 2015-08-30: 50 mg via ORAL
  Filled 2015-08-30: qty 1

## 2015-08-30 MED ORDER — DOCUSATE SODIUM 100 MG PO CAPS
100.0000 mg | ORAL_CAPSULE | Freq: Two times a day (BID) | ORAL | Status: DC
  Administered 2015-08-30 – 2015-09-08 (×14): 100 mg via ORAL
  Filled 2015-08-30 (×15): qty 1

## 2015-08-30 MED ORDER — SODIUM CHLORIDE 0.9 % IV SOLN
Freq: Once | INTRAVENOUS | Status: AC
  Administered 2015-08-30: 16:00:00 via INTRAVENOUS

## 2015-08-30 NOTE — Progress Notes (Signed)
Progress Note   Subjective  [name] [name] is a 75 y/o Caucasian female who we consulted yesterday in regards to a normocytic anemia with elevated LFT's.   This morning, patient is found sitting up in her chair by her bedside, she tells me that she was able to tolerate a "fruit plate and cottage cheese" this morning for breakfast, though she does continue with nausea, regardless of anti-nausea meds. Otherwise, she feels somewhat better/the same and is aware of plans for ERCP likely in the future.   Objective   Vital signs in last 24 hours: Temp:  [97.7 F (36.5 C)-98.6 F (37 C)] 98.6 F (37 C) (06/24 0406) Pulse Rate:  [65-79] 72 (06/24 0406) Resp:  [17-28] 18 (06/24 0406) BP: (104-136)/(39-86) 128/46 mmHg (06/24 0406) SpO2:  [92 %-100 %] 100 % (06/24 0406) Weight:  [132 lb 9.6 oz (60.147 kg)] 132 lb 9.6 oz (60.147 kg) (06/24 0615) Last BM Date: 08/27/15 General: Caucasian female in NAD Heart:  Regular rate and rhythm; no murmurs Lungs: Respirations even and unlabored, lungs CTA bilaterally Abdomen:  Soft, Mild ttp epigastrum and RUQ and nondistended. Normal bowel sounds. Extremities:  Without edema. Neurologic:  Alert and oriented,  grossly normal neurologically. Psych:  Cooperative. Normal mood and affect.  Intake/Output from previous day: 06/23 0701 - 06/24 0700 In: 315 [P.O.:240; I.V.:75] Out: 1575 [Urine:1575] Intake/Output this shift: Total I/O In: 240 [P.O.:240] Out: 200 [Urine:200]  Lab Results:  Recent Labs  08/28/15 1825 08/28/15 1834 08/29/15 0409 08/30/15 0340  WBC 12.5*  --  9.3 7.7  HGB 8.1* 8.8* 7.5* 7.3*  HCT 26.8* 26.0* 25.3* 24.2*  PLT 405*  --  339 284   BMET  Recent Labs  08/28/15 1825 08/28/15 1834 08/29/15 0409 08/30/15 0340  NA 137 140 135 137  K 3.6 3.6 3.5 3.5  CL 111 110 112* 111  CO2 16*  --  17* 19*  GLUCOSE 115* 110* 194* 169*  BUN 32* 29* 31* 27*  CREATININE 2.48* 2.30* 2.27* 2.09*  CALCIUM 8.0*  --  7.7* 7.6*    LFT Hepatic Function Latest Ref Rng 08/30/2015 08/29/2015 08/28/2015  Total Protein 6.5 - 8.1 g/dL 4.9(L) 5.3(L) 5.8(L)  Albumin 3.5 - 5.0 g/dL 1.7(L) 1.8(L) 2.1(L)  AST 15 - 41 U/L 55(H) 195(H) 396(H)  ALT 14 - 54 U/L 109(H) 214(H) 286(H)  Alk Phosphatase 38 - 126 U/L 315(H) 380(H) 457(H)  Total Bilirubin 0.3 - 1.2 mg/dL 0.3 0.5 0.9    PT/INR  Recent Labs  08/28/15 1825  LABPROT 17.3*  INR 1.40    Studies/Results: Ct Chest Wo Contrast  08/29/2015  CLINICAL DATA:  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 Further evaluation requested. Initial encounter. EXAM: CT CHEST WITHOUT CONTRAST TECHNIQUE: Multidetector CT imaging of the chest was performed following the standard protocol without IV contrast. COMPARISON:  Chest radiograph from 04/06/2012, CT of the abdomen and pelvis from 03/08/2012, and MRI of the thoracic spine performed earlier today at 7:27 p.m. 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 This progresses to the level of the proximal abdominal aorta, where the aorta measures 3.8 cm at the diaphragm. It is normal in caliber starting at the level of the superior mesenteric artery. The ascending thoracic aorta and aortic arch are grossly unremarkable, aside from  scattered calcification along the thoracic abdominal aorta. Calcification is noted at the origin of the superior mesenteric artery. Diffuse coronary artery calcifications are seen. Mediastinum: The mediastinum is otherwise unremarkable in appearance. No mediastinal lymphadenopathy is seen. No pericardial effusion is identified. Scattered calcification is noted along the proximal great vessels. The patient is status post median sternotomy. The visualized portions of thyroid gland are grossly unremarkable. No  axillary lymphadenopathy is seen. Lungs/Pleura: Prominent high density at the left lung base reflects prior talc pleurodesis, with left-sided extrapleural fat again noted. Mild associated scarring is seen. Trace right-sided pleural fluid is noted. A 8 mm nodule is noted at the right lower lobe (image 96 of 164), while a 5 mm pleural-based nodule is noted at the right upper lobe (image 40 of 164). The right lung is otherwise clear. Upper Abdomen: The visualized portions of the liver and spleen are grossly unremarkable. Stones are noted dependently within the gallbladder. Postoperative change is noted. There is severe chronic left-sided hydronephrosis, with severe chronic left renal atrophy. Minimal right-sided renal atrophy is suggested, new from the prior study. Musculoskeletal: There is chronic compression deformity involving vertebral body T12. Degenerative change is noted at the lower cervical spine, with chronic osseous fusion noted at C7-T1. IMPRESSION: 1. 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 The aorta measures 3.8 cm at the diaphragm. It is normal in caliber beginning at the level of the superior mesenteric artery. Recommend annual imaging followup by MRA or CTA, as deemed clinically appropriate. 2. Diffuse coronary artery calcifications seen. 3. Scattered calcification along the thoracic and proximal abdominal aorta, with calcification at the origin of the superior mesenteric artery. 4. Prominent high density at the left lung base reflects prior talc pleurodesis, with mild associated scarring. 5. Trace right-sided pleural fluid noted. 6. Two right-sided pulmonary nodules noted, measuring 8 mm and 5 mm. Non-contrast chest CT at 3-6 months is recommended. If the nodules are stable at time of repeat CT, then future CT at 18-24 months (from today's scan) is considered optional for low-risk patients,  but is recommended for high-risk patients. This recommendation follows the consensus statement: Guidelines for Management of Incidental Pulmonary Nodules Detected on CT Images:From the Fleischner Society 2017; published online before print (10.1148/radiol.[id number]). 7. Cholelithiasis.  Gallbladder otherwise unremarkable. 8. Severe chronic left-sided hydronephrosis, with severe chronic left renal atrophy. Minimal right renal atrophy suggested, new from the prior study. 9. Chronic compression deformity involving vertebral body T12. Electronically Signed   By: Garald Balding M.D.   On: 08/29/2015 00:46   Mr Brain Wo Contrast  08/29/2015  CLINICAL DATA:  Initial evaluation for acute weakness. EXAM: MRI HEAD WITHOUT CONTRAST TECHNIQUE: Multiplanar, multiecho pulse sequences of the brain and surrounding structures were obtained without intravenous contrast. COMPARISON:  Prior CT from 06/07/2012 as well as arteriogram from 12/02/2004. FINDINGS: Mild diffuse prominence of the CSF containing spaces is compatible with generalized age-related cerebral atrophy. Patchy T2/FLAIR hyperintensity within the periventricular and deep white matter both cerebral hemispheres most consistent with chronic small vessel ischemic disease. Few scattered superimposed remote lacunar infarctions present. Probable small remote lacunar infarct within the left thalamus. Small remote infarcts within the left cerebellar hemisphere. Encephalomalacia within the left parietal and occipital lobes also consistent with remote infarcts. No abnormal foci of restricted diffusion to suggest acute infarct. Gray-white matter differentiation maintained. There is an abnormal flow void within the right internal carotid artery, which  may related to slow flow and/ or occlusion. 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 Abnormal flow void within the left vertebral artery  is well, which may be related to slow flow and/or occlusion. Few scattered punctate foci susceptibility artifact present within the supratentorial and infratentorial brain, likely small chronic micro hemorrhages, most likely related a chronic underlying hypertension. No mass lesion, midline shift, or mass effect. No hydrocephalus. No extra-axial fluid collection. Major dural sinuses are patent. Craniocervical junction within normal limits. Visualized upper cervical spine without acute abnormality para Pituitary gland within normal limits. No acute abnormality about the globes and orbits. Patient is status post bilateral lens extraction. Paranasal sinuses are clear. Trace opacity within left mastoid air cells. Inner ear structures normal. Bone marrow signal intensity within normal limits. No scalp soft tissue abnormality. IMPRESSION: 1. No acute intracranial infarct or other process identified. 2. 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 Additional scattered remote small vessel lacunar infarcts. 3. Abnormal flow voids within the right ICA and distal left vertebral artery, which may be related to slow flow and/or occlusion. 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 Patchy slow flow was also present within the left vertebral artery at that time as well. 4. Age-related cerebral atrophy with moderate chronic small vessel ischemic disease. Electronically Signed   By: Jeannine Boga M.D.   On: 08/29/2015 01:31   Mr Thoracic Spine Wo Contrast  08/28/2015  CLINICAL DATA:  75 year old female with 3 weeks of bilateral lower extremity weakness. Ataxia. Unintentional weight loss for 2 months. Anemia. Initial encounter. EXAM: MRI THORACIC SPINE WITHOUT CONTRAST TECHNIQUE: Multiplanar, multisequence MR imaging of the thoracic spine was performed. No  intravenous contrast was administered. COMPARISON:  Chest radiographs 04/06/2013. Lumbar MRI from today reported separately. CT Abdomen and Pelvis 03/08/2012. FINDINGS: Limited sagittal imaging of the cervical spine suggests widespread advanced cervical spine disc and endplate degeneration (series 3, image 7). There is evidence of associated spinal stenosis, perhaps maximal at C3-C4. There is severe disc space loss at C7-T1 associated with mild spondylolisthesis and facet hypertrophy. No significant spinal stenosis results at this level. There is moderate to severe left C8 foraminal stenosis. There is a chronic mild to moderate T12 compression fracture. Elsewhere thoracic vertebral height and alignment is preserved. No marrow edema or evidence of acute osseous abnormality. The thoracic spinal canal is capacious. There are occasional small thoracic disc protrusions. At T11-T12 there is disc osteophyte complex - in part related to the mild T12 posterior superior endplate retropulsion - in addition to mild posterior element hypertrophy, but no significant thoracic spinal stenosis results. Spinal cord signal is within normal limits at all visualized levels. The conus medullaris occurs below the L1 level, see lumbar comparison from today. There is abnormal increased signal intensity in the left lower lobe (series 8, image 32), a within nonspecific appearance. There is aneurysmal enlargement of the descending thoracic aorta at the T9-T10 level measuring 4.3 cm with mural plaque or thrombus (series 8, image 30). There is also a small right lung pulmonary nodule on image 26. Negative visualized upper abdominal viscera. IMPRESSION: 1. No thoracic spinal stenosis, despite a chronic T12 compression fracture. Very mild for age thoracic spine degenerative changes. 2. Advanced cervical spine degeneration suspected with probable cervical spinal stenosis.  Cervical spine MRI would evaluate further. 3. 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 The descending thoracic aortic aneurysm diameters estimated at 4.3 cm on this exam Electronically Signed   By: Genevie Ann M.D.   On: 08/28/2015 20:23   Mr Lumbar Spine Wo Contrast  08/28/2015  CLINICAL DATA:  75 year old female with 3 weeks of bilateral lower extremity weakness. Ataxia. Unintentional weight loss for 2 months. Anemia. Initial encounter. EXAM: MRI LUMBAR SPINE WITHOUT CONTRAST TECHNIQUE: Multiplanar, multisequence MR imaging of the lumbar spine was performed. No intravenous contrast was administered. COMPARISON:  Thoracic spine MRI from today reported separately. [facility] retrograde pyelogram 08/01/2015. Renal ultrasound 01/09/2015. CT Abdomen and Pelvis 03/08/2012 FINDINGS: Segmentation:  Normal Alignment: Chronic T12 compression fracture unchanged since 2014. Lumbar vertebral height and alignment is stable. There is mild retrolisthesis at L2-L3. Vertebrae: No marrow edema or evidence of acute osseous abnormality. Conus medullaris: Extends to the L1-L2 and appears normal. No lower thoracic spinal cord signal abnormality. Paraspinal and other soft tissues: Cholelithiasis. Furthermore, the CBD is dilated up to 16 mm diameter and there are low signal filling defects in the distal common bile duct as seen on series 6, image 10. These measure up to 10 mm diameter. The visible gallbladder does not appear inflamed. Chronic severe left renal atrophy, left hydronephrosis, and left hydroureter. Fluid fluid levels in the left renal collecting system. There is evidence of a right ureteral stent. There is mild right and hydroureter which is new since 2014. Diverticulosis of the sigmoid colon is partially visible in the pelvis. Disc levels: Mild lumbar spine degeneration  above the L4 vertebral level. No significant lumbar spinal stenosis at those levels. Also, there are no significant degenerative changes at L5-S1. L4-L5: Circumferential disc bulge with superimposed right paracentral broad-based disc protrusion. Superimposed moderate facet and ligament flavum hypertrophy with a blunted and facet joint fluid. Moderate spinal stenosis (series 6, image 26). Mild right greater than left L4 foraminal stenosis. IMPRESSION: 1. Cholelithiasis AND Choledocholithiasis with dilated CBD up to 16 mm. See series 6, images 4 through 13. Recommend Gastroenterology consultation. 2. Fairly mild for age lumbar spine degeneration. Isolated moderate lumbar spinal stenosis at L4-L5 related to disc and posterior element degeneration. No acute osseous abnormality. 3. 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 There appears to be a right side ureteral stent in place. 4. See also thoracic spine MRI findings from today reported separately. Electronically Signed   By: Genevie Ann M.D.   On: 08/28/2015 20:39   US Abdomen Complete  08/29/2015  CLINICAL DATA:  75 year old female with abnormal liver function. EXAM: ABDOMEN ULTRASOUND COMPLETE COMPARISON:  Renal ultrasound dated 01/09/2015 and lumbar spine MRI dated 08/28/2015 FINDINGS: Gallbladder: There are multiple stones within the gallbladder. There is no gallbladder wall thickening or pericholecystic fluid. Negative sonographic Murphy's sign. Common bile duct: Diameter: Dilated measuring up to 12 mm in the midportion. Stones noted in the central CBD on the lumbar MRI. Liver: No focal lesion identified. Within normal limits in parenchymal echogenicity. IVC: No abnormality visualized. Pancreas: Visualized portion unremarkable. Spleen: Size and appearance within normal limits. Right Kidney: Length: 9.6 cm. There is a 1.4 x 1 3 x 1.2 cm exophytic cyst from the anterior cortex of the right renal interpolar region. A right ureteral stent is  partially visualized. There is mild right hydronephrosis. Left Kidney: Length: 11.9 cm. There is severe left hydronephrosis with left renal parenchymal atrophy and cortical thinning. There is diffuse increased left renal parenchymal echogenicity. Abdominal aorta: The aorta measures 3.3 cm in diameter. The distal aorta is not visualized. Other findings: There is layering debris within the urinary bladder. Correlation with urinalysis recommended to exclude UTI. IMPRESSION: Cholelithiasis without sonographic evidence of acute cholecystitis. Mild right hydronephrosis. A ureteral stent is partially visualized in the right renal pelvis. Severe left hydronephrosis with parenchymal atrophy and cortical thinning of the left kidney. 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 CT may provide better evaluation of the aorta. Electronically Signed   By: Anner Crete M.D.   On: 08/29/2015 05:25       Assessment / Plan:   Assessment: 1. Choledocholithiasis, cholelithiasis-elevated LFT's, though trending downward, 3 wks of postprandial n/v and several mos of weight loss and anorexia 2. Normocytic anemia/ longstanding IDA: decrease in hgb overnight to 7.3 this am, plans for transfusion per internal medicine 3. Acute on chronic CKD 4. Temporary LE numbness and loss of function: resolved 5. Dyspepsia: n/v with weight loss-consider choledocholithiasis vs gastritis vs PUD vs other  Plan: 1. Again, pt will need EGD and ERCP for further eval of choledocholithiasis seen on lumbar MRI, this will occur 5 d after she is off Plavix-Wednesday at earliest 2. Agree with continual hgb checks and transfusion as necessary 3. Continue anti-emetics 4. Monitor LFT's 5. Will discuss above with Dr. Hilarie Fredrickson, please await any further recs  Thank you for your kind consultation, we will continue to follow.  Active Problems:   UTI (lower urinary tract infection)   Diabetes mellitus, type II (HCC)   Elevated LFTs   Hypotension   Pressure  ulcer   Protein-calorie malnutrition, severe     LOS: 2 days   Levin Erp  08/30/2015, 11:10 AM  Pager # [phone]

## 2015-08-30 NOTE — Progress Notes (Addendum)
Patient ID: CHEYENNA PANKOWSKI, female   DOB: 01/12/1941, 75 y.o.   MRN: [id number]  PROGRESS NOTE    LILLIONNA NABI  KXF:[id number] DOB: 04-19-1940 DOA: 08/28/2015  PCP: [name], MD   Brief Narrative:  75 y.o. Female with past medical history hypertension, diabetes, dyslipidemia, chronic kidney disease stage IV. She presented to Dr. Ronnie Derby office because of inability to walk. She was found to have low blood pressure and liver function enzymes were elevated. She was sent to ER for further evaluation.  In ED, patient was hemodynamically stable. Blood work was notable for leukocytosis of 12.5, hemoglobin 8.1, platelets 405, creatinine 2.48. Abdominal ultrasound showed cholelithiasis without sonographic evidence of acute cholecystitis. She has mild right hydronephrosis with ureteral stent and then severe left hydronephrosis. MRI of thoracic spine did not show spinal stenosis despite chronic T12 compression fracture. 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 She was also found to have 2 right-sided pulmonary nodules 8 mm and 5 mm and CT at 3-6 months is recommended for follow-up and if the nodules are stable at the time of repeat and further CT scan is suggested at 18-24 months for low risk patients option of but recommended for high-risk patients. 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 In regards to cholelithiasis, GI has seen her in consultation. Patient needs to be off of Plavix for 5 days prior to ERCP.  Transferred from SDU to telemetry floor 08/29/2015.   Assessment & Plan:   Hypotension with weakness  - Likely because of hypotension  - No evidence of spinal stenosis in the thoracic spine however she does probably have some stenosis in cervical spine  - TSH within normal limits - Troponin level normal - BP 125/49 this am - Appreciate PT evaluation  and recommendations   Cholelithiasis - No sonographic evidence of acute cholecystitis - Appreciate GI following  - The following is the LFT trend since admission, improving  Hepatic Function 08/30/2015 08/29/2015 08/28/2015  AST 55(H) 195(H) 396(H)  ALT 109(H) 214(H) 286(H)  Alk Phosphatase 315(H) 380(H) 457(H)  Total Bilirubin 0.3 0.5 0.9  - Follow-up daily CMP - Acute hepatitis panel negative - Pt needs to be off of plavix for 5 days prior to ERCP; plavix held 6/23 and on  Urinary tract infection / leukocytosis - Large leukocytes on admission with many bacteria - Continue Ancef  - Urine culture with multiple species none predominant   Hypothyroidism - Continue Synthroid - TSH within normal limits on this admission  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 She was on Bactrim which could have exacerbated kidney function. Bactrim on hold - Cr 2.09 this am  Anemia of chronic kidney disease - Hgb 7.3 this am - Will give 1 U PRBC transfusion today - Check CBC in am  Severe protein calorie malnutrition - In the context of chronic illness - Seen by dietician    DVT prophylaxis: SCDs bilaterally  Code Status: full code  Family Communication: No family at the bedside  Disposition Plan: Home once cleared by GI  Consultants:   GI  Procedures:   None   Antimicrobials:   Cefazolin    Subjective: No overnight events. No reports of pain.   Objective: Filed Vitals:   08/29/15 1831 08/29/15 2008 08/30/15 0406 08/30/15 0615  BP: 121/39 131/64 128/46   Pulse: 79 65 72   Temp: 97.7 F (36.5 C) 97.7 F (36.5 C) 98.6 F (37 C)     TempSrc: Oral Oral Oral   Resp: '20 18 18   ' Height:      Weight:    60.147 kg (132 lb 9.6 oz)  SpO2: 100% 98% 100%     Intake/Output Summary (Last 24 hours) at 08/30/15 0933 Last data filed at 08/30/15 0615  Gross per 24 hour  Intake    240 ml  Output   1475 ml  Net  -1235 ml   Filed Weights   08/29/15 0600 08/30/15 0615  Weight: 60.555 kg (133 lb 8 oz) 60.147 kg (132 lb 9.6 oz)    Examination:  General exam: No acute distress  Respiratory system: No wheezing, no rhonchi  Cardiovascular system: S1 & S2 heard, rate controlled  Gastrointestinal system: (+) BS, non tender  Central nervous system: No focal neurological deficits. Extremities: Symmetric 5 x 5 power.No tenderness, palpable pulses  Skin: warm and dry Psychiatry: Normal mood and behavior   Data Reviewed: I have personally reviewed following labs and imaging studies  CBC:  Recent Labs Lab 08/27/15 0841 08/28/15 1825 08/28/15 1834 08/29/15 0409 08/30/15 0340  WBC 10.2 12.5*  --  9.3 7.7  NEUTROABS 6.8 10.2*  --  7.7  --   HGB 8.6 Repeated and verified X2.* 8.1* 8.8* 7.5* 7.3*  HCT 26.9* 26.8* 26.0* 25.3* 24.2*  MCV 79.7 84.3  --  81.9 81.5  PLT 407.0* 405*  --  339 151   Basic Metabolic Panel:  Recent Labs Lab 08/27/15 0841 08/28/15 1825 08/28/15 1834 08/29/15 0409 08/30/15 0340  NA 138 137 140 135 137  K 3.5 3.6 3.6 3.5 3.5  CL 108 111 110 112* 111  CO2 19 16*  --  17* 19*  GLUCOSE 110* 115* 110* 194* 169*  BUN 30* 32* 29* 31* 27*  CREATININE 1.90* 2.48* 2.30* 2.27* 2.09*  CALCIUM 8.6 8.0*  --  7.7* 7.6*   GFR: Estimated Creatinine Clearance: 22.1 mL/min (by C-G formula based on Cr of 2.09). Liver Function Tests:  Recent Labs Lab 08/27/15 0841 08/28/15 1825 08/29/15 0409 08/30/15 0340  AST 96* 396* 195* 55*  ALT 154* 286* 214* 109*  ALKPHOS 337* 457* 380* 315*  BILITOT 0.5 0.9 0.5 0.3  PROT 6.1 5.8* 5.3* 4.9*  ALBUMIN 2.7* 2.1* 1.8* 1.7*   No results for input(s): LIPASE,  AMYLASE in the last 168 hours. No results for input(s): AMMONIA in the last 168 hours. Coagulation Profile:  Recent Labs Lab 08/28/15 1825  INR 1.40   Cardiac Enzymes:  Recent Labs Lab 08/29/15 0409 08/29/15 1105 08/29/15 1526  CKTOTAL 36*  --   --   CKMB 2.1  --   --   TROPONINI <0.03 <0.03 <0.03   BNP (last 3 results) No results for input(s): PROBNP in the last 8760 hours. HbA1C:  Recent Labs  08/29/15 0409  HGBA1C 5.6   CBG:  Recent Labs Lab 08/29/15 1126 08/29/15 1624 08/29/15 2054 08/30/15 0404 08/30/15 0628  GLUCAP 130* 148* 145* 185* 155*   Lipid Profile: No results for input(s): CHOL, HDL, LDLCALC, TRIG, CHOLHDL, LDLDIRECT in the last 72  hours. Thyroid Function Tests:  Recent Labs  08/29/15 0409  TSH 0.496   Anemia Panel: No results for input(s): VITAMINB12, FOLATE, FERRITIN, TIBC, IRON, RETICCTPCT in the last 72 hours. Urine analysis:    Component Value Date/Time   COLORURINE YELLOW 08/28/2015 1812   APPEARANCEUR TURBID* 08/28/2015 1812   LABSPEC 1.021 08/28/2015 1812   PHURINE 5.5 08/28/2015 1812   GLUCOSEU NEGATIVE 08/28/2015 1812   GLUCOSEU NEGATIVE 07/03/2015 1352   HGBUR LARGE* 08/28/2015 1812   BILIRUBINUR NEGATIVE 08/28/2015 1812   KETONESUR 15* 08/28/2015 1812   PROTEINUR >300* 08/28/2015 1812   UROBILINOGEN 0.2 07/03/2015 1352   NITRITE NEGATIVE 08/28/2015 1812   LEUKOCYTESUR LARGE* 08/28/2015 1812   Sepsis Labs: '@[email or web address]' (procalcitonin:4,lacticidven:4)   Recent Results (from the past 240 hour(s))  MRSA PCR Screening     Status: None   Collection Time: 08/29/15  6:11 AM  Result Value Ref Range Status   MRSA by PCR NEGATIVE NEGATIVE Final      Radiology Studies: Ct Chest Wo Contrast 08/29/2015   1. 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 The aorta measures 3.8 cm at the diaphragm. It is normal in caliber  beginning at the level of the superior mesenteric artery. Recommend annual imaging followup by MRA or CTA, as deemed clinically appropriate. 2. Diffuse coronary artery calcifications seen. 3. Scattered calcification along the thoracic and proximal abdominal aorta, with calcification at the origin of the superior mesenteric artery. 4. Prominent high density at the left lung base reflects prior talc pleurodesis, with mild associated scarring. 5. Trace right-sided pleural fluid noted. 6. Two right-sided pulmonary nodules noted, measuring 8 mm and 5 mm. Non-contrast chest CT at 3-6 months is recommended. If the nodules are stable at time of repeat CT, then future CT at 18-24 months (from today's scan) is considered optional for low-risk patients, but is recommended for high-risk patients. 7. Cholelithiasis.  Gallbladder otherwise unremarkable. 8. Severe chronic left-sided hydronephrosis, with severe chronic left renal atrophy. Minimal right renal atrophy suggested, new from the prior study. 9. Chronic compression deformity involving vertebral body T12.  Mr Brain Wo Contrast 08/29/2015  1. No acute intracranial infarct or other process identified. 2. 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 Additional scattered remote small vessel lacunar infarcts. 3. Abnormal flow voids within the right ICA and distal left vertebral artery, which may be related to slow flow and/or occlusion. 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 Patchy slow flow was also present within the left vertebral artery at that time as well. 4. Age-related cerebral atrophy with moderate chronic small vessel ischemic disease. Electronically Signed   By: Jeannine Boga M.D.   On: 08/29/2015 01:31   Mr Thoracic Spine Wo Contrast 08/28/2015  1. No thoracic spinal stenosis, despite a  chronic T12 compression fracture. Very mild for age thoracic spine degenerative changes. 2. Advanced cervical spine degeneration suspected with probable cervical spinal stenosis. Cervical spine MRI would evaluate further. 3. 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 The descending thoracic aortic aneurysm diameters estimated at 4.3 cm on this exam Electronically Signed  By: Genevie Ann M.D.   On: 08/28/2015 20:23   Mr Lumbar Spine Wo Contrast 08/28/2015   1. Cholelithiasis AND Choledocholithiasis with dilated CBD up to 16 mm. See series 6, images 4 through 13. Recommend Gastroenterology consultation. 2. Fairly mild for age lumbar spine degeneration. Isolated moderate lumbar spinal stenosis at L4-L5 related to disc and posterior element degeneration. No acute osseous abnormality. 3. 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 There appears to be a right side ureteral stent in place. 4. See also thoracic spine MRI findings from today reported separately. Electronically Signed   By: Genevie Ann M.D.   On: 08/28/2015 20:39   US Abdomen Complete 08/29/2015   Cholelithiasis without sonographic evidence of acute cholecystitis. Mild right hydronephrosis. A ureteral stent is partially visualized in the right renal pelvis. Severe left hydronephrosis with parenchymal atrophy and cortical thinning of the left kidney. 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 CT may provide better evaluation of the aorta.    Scheduled Meds: . aspirin  81 mg Oral QHS  .  ceFAZolin (ANCEF) IV  500 mg Intravenous Q12H  . feeding supplement (GLUCERNA SHAKE)  237 mL Oral TID BM  . insulin aspart  0-9 Units Subcutaneous TID WC  . latanoprost  1 drop Both Eyes QHS  . levothyroxine  125 mcg Oral QAC breakfast  .  multivitamin with minerals  1 tablet Oral Daily  . pantoprazole  40 mg Oral Daily  . sodium chloride flush  3 mL Intravenous Q12H   Continuous Infusions:     LOS: 2 days    Time spent: 25 minutes  Greater than 50% of the time spent on counseling and coordinating the care.   Leisa Lenz, MD [facility] Pager [phone]  If 7PM-7AM, please contact night-coverage [email or web address] Password [facility] 08/30/2015, 9:33 AM

## 2015-08-31 LAB — CBC
HCT: 28.1 % — ABNORMAL LOW (ref 36.0–46.0)
HCT: 29.5 % — ABNORMAL LOW (ref 36.0–46.0)
Hemoglobin: 8.7 g/dL — ABNORMAL LOW (ref 12.0–15.0)
Hemoglobin: 9.1 g/dL — ABNORMAL LOW (ref 12.0–15.0)
MCH: 24.9 pg — ABNORMAL LOW (ref 26.0–34.0)
MCH: 24.9 pg — AB (ref 26.0–34.0)
MCHC: 31 g/dL (ref 30.0–36.0)
MCHC: 30.8 g/dL (ref 30.0–36.0)
MCV: 80.3 fL (ref 78.0–100.0)
MCV: 80.8 fL (ref 78.0–100.0)
PLATELETS: 296 10*3/uL (ref 150–400)
PLATELETS: 323 10*3/uL (ref 150–400)
RBC: 3.5 MIL/uL — AB (ref 3.87–5.11)
RBC: 3.65 MIL/uL — ABNORMAL LOW (ref 3.87–5.11)
RDW: 16.8 % — ABNORMAL HIGH (ref 11.5–15.5)
RDW: 16.7 % — AB (ref 11.5–15.5)
WBC: 7.6 10*3/uL (ref 4.0–10.5)
WBC: 7.5 10*3/uL (ref 4.0–10.5)

## 2015-08-31 LAB — COMPREHENSIVE METABOLIC PANEL
ALT: 61 U/L — AB (ref 14–54)
AST: 46 U/L — AB (ref 15–41)
Albumin: 1.7 g/dL — ABNORMAL LOW (ref 3.5–5.0)
Alkaline Phosphatase: 308 U/L — ABNORMAL HIGH (ref 38–126)
Anion gap: 6 (ref 5–15)
BUN: 27 mg/dL — AB (ref 6–20)
CHLORIDE: 112 mmol/L — AB (ref 101–111)
CO2: 21 mmol/L — AB (ref 22–32)
CREATININE: 1.89 mg/dL — AB (ref 0.44–1.00)
Calcium: 8.2 mg/dL — ABNORMAL LOW (ref 8.9–10.3)
GFR calc Af Amer: 29 mL/min — ABNORMAL LOW (ref >60)
GFR, EST NON AFRICAN AMERICAN: 25 mL/min — AB (ref >60)
Glucose, Bld: 123 mg/dL — ABNORMAL HIGH (ref 65–99)
Potassium: 4.4 mmol/L (ref 3.5–5.1)
Sodium: 139 mmol/L (ref 135–145)
Total Bilirubin: 0.6 mg/dL (ref 0.3–1.2)
Total Protein: 5.3 g/dL — ABNORMAL LOW (ref 6.5–8.1)

## 2015-08-31 LAB — GLUCOSE, CAPILLARY
GLUCOSE-CAPILLARY: 155 mg/dL — AB (ref 65–99)
Glucose-Capillary: 107 mg/dL — ABNORMAL HIGH (ref 65–99)
Glucose-Capillary: 123 mg/dL — ABNORMAL HIGH (ref 65–99)
Glucose-Capillary: 142 mg/dL — ABNORMAL HIGH (ref 65–99)
Glucose-Capillary: 160 mg/dL — ABNORMAL HIGH (ref 65–99)
Glucose-Capillary: 172 mg/dL — ABNORMAL HIGH (ref 65–99)

## 2015-08-31 NOTE — Progress Notes (Signed)
Patient ID: [name] [name], female   DOB: [birth year], 75 y.o.   MRN: [id number]  PROGRESS NOTE    [name] [name]  [id number] DOB: 24-Jul-1940 DOA: 08/28/2015  PCP: [name], MD   Brief Narrative:  75 y.o. Female with past medical history hypertension, diabetes, dyslipidemia, chronic kidney disease stage IV. She presented to Dr. Ronnie Derby office because of inability to walk. She was found to have low blood pressure and liver function enzymes were elevated. She was sent to ER for further evaluation.  In ED, patient was hemodynamically stable. Blood work was notable for leukocytosis of 12.5, hemoglobin 8.1, platelets 405, creatinine 2.48. Abdominal ultrasound showed cholelithiasis without sonographic evidence of acute cholecystitis. She has mild right hydronephrosis with ureteral stent and then severe left hydronephrosis. MRI of thoracic spine did not show spinal stenosis despite chronic T12 compression fracture. 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 She was also found to have 2 right-sided pulmonary nodules 8 mm and 5 mm and CT at 3-6 months is recommended for follow-up and if the nodules are stable at the time of repeat and further CT scan is suggested at 18-24 months for low risk patients option of but recommended for high-risk patients. 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 In regards to cholelithiasis, GI has seen her in consultation. Patient needs to be off of Plavix for 5 days prior to ERCP.  Transferred from SDU to telemetry floor 08/29/2015.   Assessment & Plan:   Hypotension with weakness  - Likely because of hypotension  - No evidence of spinal stenosis in the thoracic spine however she does probably have some stenosis in cervical spine  - TSH within normal limits - Troponin level normal - Appreciate PT evaluation    Cholelithiasis - No sonographic evidence of acute cholecystitis - Appreciate GI following  - AST 46, ALT 61, ALP 308, improving  - ERCP planned next week as pt needs to be off of plavix for 5 days (plavix held from 6/23) - Acute hepatitis panel negative  Urinary tract infection / leukocytosis - Large leukocytes on admission with many bacteria - Continue Ancef through today and then stop - Urine culture with multiple species none predominant   Hypothyroidism - Continue Synthroid - TSH within normal limits on this admission  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 She was on Bactrim which could have exacerbated kidney function. Bactrim on hold - Creatinine improving to 1.89 this am  Anemia of chronic kidney disease - Hgb 7.3 on 6/24 - Given 1 U PRBC 6/24 - Hgb this am 8.7 - GI plans on EGD as well when they do ERCP  Severe protein calorie malnutrition - In the context of chronic illness - Seen by dietician    DVT prophylaxis: SCDs bilaterally  Code Status: full code  Family Communication: No family at the bedside  Disposition Plan: Home afer EGD, ERCP next week    Consultants:   GI  Procedures:   1 unit PRBC 6/25   ECHO 08/29/2015 - EF 55% and grade 2 diastolic dysfunction   Antimicrobials:   Cefazolin 08/28/2015 -->  Subjective: No overnight events.  Objective: Filed Vitals:   08/30/15 1615 08/30/15 1647 08/30/15 1909 08/31/15 0513  BP: 140/48 144/43 137/80 146/49  Pulse: 70 70 69 62  Temp: 98.1 F (36.7 C) 98 F (36.7 C) 98.1 F (36.7 C) 98.2 F (36.8 C)  TempSrc: Oral Oral Oral Oral  Resp: 18 18 19 20   Height:      Weight:    59.467 kg (131 lb 1.6  oz)  SpO2: 100% 100% 100% 100%    Intake/Output Summary (Last 24 hours) at 08/31/15 0646 Last data filed at 08/31/15 0529  Gross per 24 hour  Intake   1515 ml  Output   1900 ml  Net   -385 ml   Filed Weights   08/29/15 0600 08/30/15 0615 08/31/15 0513  Weight: 60.555 kg (133 lb 8 oz) 60.147 kg (132 lb 9.6 oz) 59.467 kg (131 lb 1.6 oz)    Examination:  General exam: calm and comfortable  Respiratory system: Bilateral air entry, no wheezing  Cardiovascular system: S1 & S2 appreciated, rate controlled  Gastrointestinal system: (+) BS, no tenderness, no guarding  Central nervous system: Nonfocal  Extremities: no swelling, palpable pulses  Skin: no lesions or ulcers  Psychiatry: No agitation or restlessness   Data Reviewed: I have personally reviewed following labs and imaging studies  CBC:  Recent Labs Lab 08/27/15 0841 08/28/15 1825 08/28/15 1834 08/29/15 0409 08/30/15 0340 08/31/15 0008 08/31/15 0259  WBC 10.2 12.5*  --  9.3 7.7 7.5 7.6  NEUTROABS 6.8 10.2*  --  7.7  --   --   --   HGB 8.6 Repeated and verified X2.* 8.1* 8.8* 7.5* 7.3* 9.1* 8.7*  HCT 26.9* 26.8* 26.0* 25.3* 24.2* 29.5* 28.1*  MCV 79.7 84.3  --  81.9 81.5 80.8 80.3  PLT 407.0* 405*  --  339 284 323 0000000   Basic Metabolic Panel:  Recent Labs Lab 08/27/15 0841 08/28/15 1825 08/28/15 1834 08/29/15 0409 08/30/15 0340 08/31/15 0259  NA 138 137 140 135 137 139  K 3.5 3.6 3.6 3.5 3.5 4.4  CL 108 111 110 112* 111 112*  CO2 19 16*  --  17* 19* 21*  GLUCOSE 110* 115* 110* 194* 169* 123*  BUN 30* 32* 29* 31* 27* 27*  CREATININE 1.90* 2.48* 2.30* 2.27* 2.09* 1.89*  CALCIUM 8.6 8.0*  --  7.7* 7.6* 8.2*   GFR: Estimated Creatinine Clearance: 24.4 mL/min (by C-G formula based on Cr of 1.89). Liver Function Tests:  Recent Labs Lab 08/27/15 0841 08/28/15 1825 08/29/15 0409 08/30/15 0340 08/31/15 0259  AST 96* 396* 195* 55* 46*  ALT 154* 286* 214* 109* 61*  ALKPHOS 337* 457* 380* 315* 308*   BILITOT 0.5 0.9 0.5 0.3 0.6  PROT 6.1 5.8* 5.3* 4.9* 5.3*  ALBUMIN 2.7* 2.1* 1.8* 1.7* 1.7*   No results for input(s): LIPASE, AMYLASE in the last 168 hours. No results for input(s): AMMONIA in the last 168 hours. Coagulation Profile:  Recent Labs Lab 08/28/15 1825  INR 1.40   Cardiac Enzymes:  Recent Labs Lab 08/29/15 0409 08/29/15 1105 08/29/15 1526  CKTOTAL 36*  --   --   CKMB 2.1  --   --   TROPONINI <0.03 <0.03 <0.03   BNP (last 3 results) No results for input(s): PROBNP in the last 8760 hours. HbA1C:  Recent Labs  08/29/15 0409  HGBA1C 5.6   CBG:  Recent Labs Lab 08/30/15 1102 08/30/15 1628 08/30/15 2126 08/31/15 0032 08/31/15 0522  GLUCAP 178* 216* 147* 172* 107*   Lipid Profile: No results for input(s): CHOL, HDL, LDLCALC, TRIG, CHOLHDL, LDLDIRECT in the last 72 hours. Thyroid Function Tests:  Recent Labs  08/29/15 0409  TSH 0.496   Anemia Panel: No results for input(s): VITAMINB12, FOLATE, FERRITIN, TIBC, IRON, RETICCTPCT in the last 72 hours. Urine analysis:    Component Value Date/Time   COLORURINE YELLOW 08/28/2015 1812   APPEARANCEUR TURBID* 08/28/2015 1812   LABSPEC 1.021 08/28/2015 1812   PHURINE 5.5 08/28/2015 1812   GLUCOSEU NEGATIVE 08/28/2015 1812   GLUCOSEU NEGATIVE 07/03/2015 1352   HGBUR LARGE* 08/28/2015 1812   BILIRUBINUR NEGATIVE 08/28/2015 1812   KETONESUR 15* 08/28/2015 1812   PROTEINUR >300* 08/28/2015 1812   UROBILINOGEN 0.2 07/03/2015 1352   NITRITE NEGATIVE 08/28/2015 1812   LEUKOCYTESUR LARGE* 08/28/2015 1812   Sepsis Labs: @LABRCNTIP (procalcitonin:4,lacticidven:4)   Recent Results (from the past 240 hour(s))  MRSA PCR Screening     Status: None   Collection Time: 08/29/15  6:11 AM  Result Value Ref Range Status   MRSA by PCR NEGATIVE NEGATIVE Final      Radiology Studies: Ct Chest Wo Contrast 08/29/2015   1. Aneurysmal dilatation of the descending thoracic aorta appears to have increased mildly in  size since 2014, now measuring 4.2 cm in maximal dimension. The aorta measures 3.8 cm at the diaphragm. It is normal in caliber beginning at the level of the superior mesenteric artery. Recommend annual imaging followup by MRA or CTA, as deemed clinically appropriate. 2. Diffuse coronary artery calcifications seen. 3. Scattered calcification along the thoracic and proximal abdominal aorta, with calcification at the origin of the superior mesenteric artery. 4. Prominent high density at the left lung base reflects prior talc pleurodesis, with mild associated scarring. 5. Trace right-sided pleural fluid noted. 6. Two right-sided pulmonary nodules noted, measuring 8 mm and 5 mm. Non-contrast chest CT at 3-6 months is recommended. If the nodules are stable at time of repeat CT, then future CT at 18-24 months (from today's scan) is considered optional for low-risk patients, but is recommended for high-risk patients. 7. Cholelithiasis.  Gallbladder otherwise unremarkable. 8. Severe chronic left-sided hydronephrosis, with severe chronic left renal atrophy. Minimal right renal atrophy suggested, new from the prior study. 9. Chronic compression deformity involving vertebral body T12.  Mr Brain Wo Contrast 08/29/2015  1. No acute intracranial infarct or other process identified. 2. 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 Additional scattered remote small vessel lacunar infarcts. 3. Abnormal flow voids within the right ICA and distal left vertebral artery, which may be related to slow flow and/or occlusion. 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 Patchy slow flow was also present within the left vertebral artery at that time as well. 4. Age-related cerebral atrophy with moderate chronic small vessel ischemic disease. Electronically Signed   By: Jeannine Boga M.D.   On: 08/29/2015 01:31   Mr Thoracic Spine Wo Contrast 08/28/2015  1. No thoracic spinal stenosis, despite a chronic T12 compression fracture. Very mild for age thoracic spine degenerative changes. 2. Advanced cervical spine degeneration suspected with probable cervical spinal stenosis. Cervical spine MRI would evaluate further. 3. Recommend Chest CT (IV contrast preferred) to evaluate nonspecific abnormal left lower lobe opacity, right lower lobe lung nodule, and to further evaluate a chronic but progressed descending thoracic aortic  fusiform aneurysm to the abdomen CT on 03/08/2012. The descending thoracic aortic aneurysm diameters estimated at 4.3 cm on this exam Electronically Signed   By: Genevie Ann M.D.   On: 08/28/2015 20:23   Mr Lumbar Spine Wo Contrast 08/28/2015   1. Cholelithiasis AND Choledocholithiasis with dilated CBD up to 16 mm. See series 6, images 4 through 13. Recommend Gastroenterology consultation. 2. Fairly mild for age lumbar spine degeneration. Isolated moderate lumbar spinal stenosis at L4-L5 related to disc and posterior element degeneration. No acute osseous abnormality. 3. 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 There appears to be a right side ureteral stent in place. 4. See also thoracic spine MRI findings from today reported separately. Electronically Signed   By: Genevie Ann M.D.   On: 08/28/2015 20:39   US Abdomen Complete 08/29/2015   Cholelithiasis without sonographic evidence of acute cholecystitis. Mild right hydronephrosis. A ureteral stent is partially visualized in the right renal pelvis. Severe left hydronephrosis with parenchymal atrophy and cortical thinning of the left kidney. 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 CT may provide better evaluation of the aorta.    Scheduled Meds: . aspirin  81 mg Oral QHS  .  ceFAZolin (ANCEF) IV  500 mg Intravenous Q12H  . docusate sodium  100 mg Oral BID  . feeding supplement (GLUCERNA SHAKE)  237  mL Oral TID BM  . insulin aspart  0-9 Units Subcutaneous TID WC  . latanoprost  1 drop Both Eyes QHS  . levothyroxine  125 mcg Oral QAC breakfast  . multivitamin with minerals  1 tablet Oral Daily  . pantoprazole  40 mg Oral Daily  . sodium chloride flush  3 mL Intravenous Q12H   Continuous Infusions:     LOS: 3 days    Time spent: 15 minutes  Greater than 50% of the time spent on counseling and coordinating the care.   Leisa Lenz, MD [facility] Pager [phone]  If 7PM-7AM, please contact night-coverage [email or web address] Password [facility] 08/31/2015, 6:46 AM

## 2015-08-31 NOTE — Progress Notes (Signed)
Progress Note   Subjective  [name] [name] is a 75 year old Caucasian female who we consult did on 08/29/15 in regards to a normocytic anemia with elevated LFTs.  This morning the patient is found sitting up in her bed, she tells me that she has been doing quite well overnight other than feeling "slightly weak when I go to walk around". The patient has been able to tolerate a regular diet and denies any nausea or vomiting episodes over the past 2 days. She denies any new complaints and is aware of plans for an ERCP on possibly Tuesday.   Objective   Vital signs in last 24 hours: Temp:  [98 F (36.7 C)-98.2 F (36.8 C)] 98.2 F (36.8 C) (06/25 0513) Pulse Rate:  [62-74] 62 (06/25 0513) Resp:  [16-20] 20 (06/25 0513) BP: (126-146)/(41-80) 146/49 mmHg (06/25 0513) SpO2:  [100 %] 100 % (06/25 0513) Weight:  [131 lb 1.6 oz (59.467 kg)] 131 lb 1.6 oz (59.467 kg) (06/25 0513) Last BM Date: 08/31/15 General: Caucasian female in NAD Heart: Regular rate and rhythm; no murmurs Lungs: Respirations even and unlabored, lungs CTA bilaterally Abdomen: Soft, Mild ttp epigastrum and RUQ and nondistended. Normal bowel sounds. Extremities: Without edema. Neurologic: Alert and oriented, grossly normal neurologically. Psych: Cooperative. Normal mood and affect.  Intake/Output from previous day: 06/24 0701 - 06/25 0700 In: 1515 [P.O.:1080; Blood:335; IV Piggyback:100] Out: 1900 [Urine:1900] Intake/Output this shift: Total I/O In: 240 [P.O.:240] Out: -   Lab Results:  Recent Labs  08/30/15 0340 08/31/15 0008 08/31/15 0259  WBC 7.7 7.5 7.6  HGB 7.3* 9.1* 8.7*  HCT 24.2* 29.5* 28.1*  PLT 284 323 296   BMET  Recent Labs  08/29/15 0409 08/30/15 0340 08/31/15 0259  NA 135 137 139  K 3.5 3.5 4.4  CL 112* 111 112*  CO2 17* 19* 21*  GLUCOSE 194* 169* 123*  BUN 31* 27* 27*  CREATININE 2.27* 2.09* 1.89*  CALCIUM 7.7* 7.6* 8.2*   LFT  Recent Labs  08/31/15 0259  PROT 5.3*    ALBUMIN 1.7*  AST 46*  ALT 61*  ALKPHOS 308*  BILITOT 0.6   PT/INR  Recent Labs  08/28/15 1825  LABPROT 17.3*  INR 1.40    Studies/Results: No results found.     Assessment / Plan:   Assessment: 1. Choledocholithiasis, cholelithiasis-elevated LFT's, though trending downward, 3 wks of postprandial n/v and several mos of weight loss and anorexia; consider relation to choledocholithiasis versus possible gastric etiology 2. Normocytic anemia/ longstanding IDA: Patient did receive 1 unit of PRBCs on 08/30/15, hemoglobin decreased from 9.1-8.7 in a 3 hour timeframe this morning 3. Acute on chronic CKD 4. Temporary LE numbness and loss of function: resolved 5. Dyspepsia: n/v with weight loss-consider choledocholithiasis vs gastritis vs PUD vs other  Plan: 1. No change in current recommendations, patient is aware the earliest we could proceed with ERCP as on Tuesday, as at that point patient will been off Plavix for 5 days. 2. Will discuss above with Dr. Hilarie Fredrickson, please await any further recs  Thank you for your kind consultation, we will continue to follow. Active Problems:   UTI (lower urinary tract infection)   Diabetes mellitus, type II (HCC)   Elevated LFTs   Hypotension   Pressure ulcer   Protein-calorie malnutrition, severe   Abnormal liver function   Choledocholithiasis   Nausea without vomiting     LOS: 3 days   Levin Erp  08/31/2015, 11:11 AM  Pager #  [phone]  

## 2015-09-01 ENCOUNTER — Ambulatory Visit: Payer: Medicare Other | Admitting: Endocrinology

## 2015-09-01 LAB — COMPREHENSIVE METABOLIC PANEL
ALT: 45 U/L (ref 14–54)
ANION GAP: 8 (ref 5–15)
AST: 60 U/L — AB (ref 15–41)
Albumin: 1.8 g/dL — ABNORMAL LOW (ref 3.5–5.0)
Alkaline Phosphatase: 294 U/L — ABNORMAL HIGH (ref 38–126)
BUN: 26 mg/dL — ABNORMAL HIGH (ref 6–20)
CHLORIDE: 107 mmol/L (ref 101–111)
CO2: 20 mmol/L — ABNORMAL LOW (ref 22–32)
Calcium: 8.3 mg/dL — ABNORMAL LOW (ref 8.9–10.3)
Creatinine, Ser: 1.69 mg/dL — ABNORMAL HIGH (ref 0.44–1.00)
GFR, EST AFRICAN AMERICAN: 33 mL/min — AB (ref >60)
GFR, EST NON AFRICAN AMERICAN: 29 mL/min — AB (ref >60)
Glucose, Bld: 151 mg/dL — ABNORMAL HIGH (ref 65–99)
POTASSIUM: 4.7 mmol/L (ref 3.5–5.1)
Sodium: 135 mmol/L (ref 135–145)
Total Bilirubin: 1.1 mg/dL (ref 0.3–1.2)
Total Protein: 5.7 g/dL — ABNORMAL LOW (ref 6.5–8.1)

## 2015-09-01 LAB — GLUCOSE, CAPILLARY
GLUCOSE-CAPILLARY: 162 mg/dL — AB (ref 65–99)
GLUCOSE-CAPILLARY: 188 mg/dL — AB (ref 65–99)
GLUCOSE-CAPILLARY: 195 mg/dL — AB (ref 65–99)
GLUCOSE-CAPILLARY: 210 mg/dL — AB (ref 65–99)
GLUCOSE-CAPILLARY: 250 mg/dL — AB (ref 65–99)
Glucose-Capillary: 184 mg/dL — ABNORMAL HIGH (ref 65–99)

## 2015-09-01 LAB — CALCIUM / CREATININE RATIO, URINE
Calcium, Ur: 2.5 mg/dL
Calcium/Creat.Ratio: 112 mg/g creat (ref 0–260)
Creatinine, Urine: 22.3 mg/dL

## 2015-09-01 LAB — CBC
HCT: 31.5 % — ABNORMAL LOW (ref 36.0–46.0)
HEMOGLOBIN: 9.6 g/dL — AB (ref 12.0–15.0)
MCH: 24.7 pg — AB (ref 26.0–34.0)
MCHC: 30.5 g/dL (ref 30.0–36.0)
MCV: 81.2 fL (ref 78.0–100.0)
PLATELETS: 354 10*3/uL (ref 150–400)
RBC: 3.88 MIL/uL (ref 3.87–5.11)
RDW: 17.1 % — AB (ref 11.5–15.5)
WBC: 10.1 10*3/uL (ref 4.0–10.5)

## 2015-09-01 MED ORDER — POLYETHYLENE GLYCOL 3350 17 G PO PACK
17.0000 g | PACK | Freq: Every day | ORAL | Status: DC | PRN
  Administered 2015-09-01: 17 g via ORAL
  Filled 2015-09-01: qty 1

## 2015-09-01 MED ORDER — ACETAMINOPHEN 325 MG PO TABS
650.0000 mg | ORAL_TABLET | Freq: Four times a day (QID) | ORAL | Status: DC | PRN
Start: 2015-09-01 — End: 2015-09-08
  Administered 2015-09-01 – 2015-09-08 (×7): 650 mg via ORAL
  Filled 2015-09-01 (×9): qty 2

## 2015-09-01 MED ORDER — ONDANSETRON HCL 4 MG/2ML IJ SOLN
4.0000 mg | Freq: Four times a day (QID) | INTRAMUSCULAR | Status: DC | PRN
  Filled 2015-09-01: qty 2

## 2015-09-01 NOTE — Care Management Important Message (Signed)
Important Message  Patient Details  Name: [name] [name] MRN: [id number] Date of Birth: 05/14/1940   Medicare Important Message Given:  Yes    Nathen May 09/01/2015, 10:36 AM

## 2015-09-01 NOTE — Progress Notes (Signed)
Daily Rounding Note  09/01/2015, 9:20 AM  LOS: 4 days   SUBJECTIVE:       No abdominal pain, no nausea.  Eating 50 to 75% of meals.  Urinating a lot.  Feels constipated, small BM this AM.  Using colace.   OBJECTIVE:         Vital signs in last 24 hours:    Temp:  [97.5 F (36.4 C)-98.2 F (36.8 C)] 97.8 F (36.6 C) (06/26 0430) Pulse Rate:  [68-80] 80 (06/26 0430) Resp:  [18-20] 18 (06/26 0430) BP: (67-154)/(52-57) 141/55 mmHg (06/26 0430) SpO2:  [100 %] 100 % (06/26 0430) Weight:  [59.149 kg (130 lb 6.4 oz)] 59.149 kg (130 lb 6.4 oz) (06/26 0430) Last BM Date: 08/31/15 Filed Weights   08/30/15 0615 08/31/15 0513 09/01/15 0430  Weight: 60.147 kg (132 lb 9.6 oz) 59.467 kg (131 lb 1.6 oz) 59.149 kg (130 lb 6.4 oz)   General: somewhat frail, tired and ill looking   Heart: RRR.  NSR in 70s on monitor.  Chest: clear bil.  No dyspnea Abdomen: soft, NT, ND.  BS active  Extremities: no CCE Neuro/Psych:  Oriented x 3.  Fully alert and engaged.  No gross deficits.   Intake/Output from previous day: 06/25 0701 - 06/26 0700 In: 685 [P.O.:480; IV Piggyback:205] Out: 2200 [Urine:2200]  Intake/Output this shift:    Lab Results:  Recent Labs  08/31/15 0008 08/31/15 0259 09/01/15 0305  WBC 7.5 7.6 10.1  HGB 9.1* 8.7* 9.6*  HCT 29.5* 28.1* 31.5*  PLT 323 296 354   BMET  Recent Labs  08/30/15 0340 08/31/15 0259 09/01/15 0305  NA 137 139 135  K 3.5 4.4 4.7  CL 111 112* 107  CO2 19* 21* 20*  GLUCOSE 169* 123* 151*  BUN 27* 27* 26*  CREATININE 2.09* 1.89* 1.69*  CALCIUM 7.6* 8.2* 8.3*   LFT  Recent Labs  08/30/15 0340 08/31/15 0259 09/01/15 0305  PROT 4.9* 5.3* 5.7*  ALBUMIN 1.7* 1.7* 1.8*  AST 55* 46* 60*  ALT 109* 61* 45  ALKPHOS 315* 308* 294*  BILITOT 0.3 0.6 1.1   PT/INR No results for input(s): LABPROT, INR in the last 72 hours. Hepatitis Panel No results for input(s): HEPBSAG, HCVAB,  HEPAIGM, HEPBIGM in the last 72 hours.  Studies/Results: No results found.   Scheduled Meds: . aspirin  81 mg Oral QHS  .  ceFAZolin (ANCEF) IV  500 mg Intravenous Q12H  . docusate sodium  100 mg Oral BID  . feeding supplement (GLUCERNA SHAKE)  237 mL Oral TID BM  . insulin aspart  0-9 Units Subcutaneous TID WC  . latanoprost  1 drop Both Eyes QHS  . levothyroxine  125 mcg Oral QAC breakfast  . multivitamin with minerals  1 tablet Oral Daily  . pantoprazole  40 mg Oral Daily  . sodium chloride flush  3 mL Intravenous Q12H   Continuous Infusions:  PRN Meds:.acetaminophen, albuterol   ASSESMENT:   *   Choledocholithiasis, cholelithiasis, 12 mm CBD.  Confirmed on ultrasound 6/23. Elevated LFTs improving, could have been up due to CBD stones and/or due to hypoperfusion/shock liver.  Sxs: anorexia, weight loss, PP N/V but no abdominial pain.    *  AKI, CKD.  Hydronephrosis.  Previous ureteral stents.   *  ASPVD, AAA.  S/p multiple vascular surgeries.  Plavix last dosed on 6/22   *  Protein cal malnutrition.     PLAN   *  ERCP: 6/28 at [facility]  09/01/2015, 9:20 AM Pager: [phone]   Attending physician's note   I have taken an interval history, reviewed the chart and examined the patient. I agree with the Advanced Practitioner's note, impression and recommendations. LFT trending down. Finding of choledocholithiasis, plan to do ERCP on 6/28. Continue to hold Plavix. Miralax as needed, titrate to have 1-2 soft BM/day  Damaris Hippo, MD [phone] Mon-Fri 8a-5p [phone] after 5p, weekends, holidays

## 2015-09-01 NOTE — Progress Notes (Signed)
Patient ID: [name] [name], female   DOB: 29-Nov-1940, 74 y.o.   MRN: [id number]  PROGRESS NOTE    [name] [name]  [name] [name] DOB: 1940-06-16 DOA: 08/28/2015  PCP: [name], MD   Brief Narrative:  75 y.o. Female with past medical history hypertension, diabetes, dyslipidemia, chronic kidney disease stage IV. She presented to Dr. Ronnie Derby office because of inability to walk. She was found to have low blood pressure and liver function enzymes were elevated. She was sent to ER for further evaluation.  In ED, patient was hemodynamically stable. Blood work was notable for leukocytosis of 12.5, hemoglobin 8.1, platelets 405, creatinine 2.48. Abdominal ultrasound showed cholelithiasis without sonographic evidence of acute cholecystitis. She has mild right hydronephrosis with ureteral stent and then severe left hydronephrosis. MRI of thoracic spine did not show spinal stenosis despite chronic T12 compression fracture. 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 She was also found to have 2 right-sided pulmonary nodules 8 mm and 5 mm and CT at 3-6 months is recommended for follow-up and if the nodules are stable at the time of repeat and further CT scan is suggested at 18-24 months for low risk patients option of but recommended for high-risk patients. 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 In regards to cholelithiasis, GI has seen her in consultation. Patient needs to be off of Plavix for 5 days prior to ERCP.  Transferred from SDU to telemetry floor 08/29/2015.   Assessment & Plan:   Hypotension with weakness  - Likely because of hypotension  - No evidence of spinal stenosis in the thoracic spine however she does probably have some stenosis in cervical spine  - Troponin level normal - Appreciate PT evaluation   Cholelithiasis - No sonographic evidence of  acute cholecystitis - LFT's improving  - ERCP planned for 6/28 as pt needs to be off of plavix for 5 days (plavix held from 6/23) - Acute hepatitis panel negative  Urinary tract infection / leukocytosis - Large leukocytes on admission with many bacteria - Stop ancef today  - Urine culture with multiple species none predominant   Hypothyroidism - Continue Synthroid - TSH within normal limits  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 She was on Bactrim which could have exacerbated kidney function. Bactrim on hold - Creatinine continues to improve, 1.6 this am  Anemia of chronic kidney disease - Hgb 7.3 on 6/24 - Given 1 U PRBC 6/24 - Hgb 9.6 today  - GI plans on EGD and ERCP 6/28  Severe protein calorie malnutrition - In the context of chronic illness - Seen by dietician    DVT prophylaxis: SCDs bilaterally  Code Status: full code  Family Communication: No family at the bedside  Disposition Plan: Home afer EGD, ERCP   Consultants:   GI  Procedures:   1 unit PRBC 6/25   ECHO 08/29/2015 - EF 55% and grade 2 diastolic dysfunction   Antimicrobials:   Cefazolin 08/28/2015 -->   Subjective: No overnight events.  Objective: Filed Vitals:   08/31/15 1350 08/31/15 2013  09/01/15 0430 09/01/15 1316  BP: 154/52 67/57 141/55 141/50  Pulse: 68 72 80 68  Temp: 97.5 F (36.4 C) 98.2 F (36.8 C) 97.8 F (36.6 C) 97.8 F (36.6 C)  TempSrc: Oral Oral Oral Oral  Resp: 18 20 18 18   Height:      Weight:   59.149 kg (130 lb 6.4 oz)   SpO2: 100% 100% 100% 100%    Intake/Output Summary (Last 24 hours) at 09/01/15 1520 Last data filed at  09/01/15 1300  Gross per 24 hour  Intake    805 ml  Output   2951 ml  Net  -2146 ml   Filed Weights   08/30/15 0615 08/31/15 0513 09/01/15 0430  Weight: 60.147 kg (132 lb 9.6 oz) 59.467 kg (131 lb 1.6 oz) 59.149 kg (130 lb 6.4 oz)    Examination:  General exam: no distress  Respiratory system: no wheezing, no rhonchi  Cardiovascular system: S1 & S2(+), rate controlled Gastrointestinal system: appreciate bowel sounds, non tender abdomen  Central nervous system: No focal deficits  Extremities: no edema, palpable pulses  Skin: warm, dry Psychiatry: No agitation, normal mood  Data Reviewed: I have personally reviewed following labs and imaging studies  CBC:  Recent Labs Lab 08/27/15 0841 08/28/15 1825  08/29/15 0409 08/30/15 0340 08/31/15 0008 08/31/15 0259 09/01/15 0305  WBC 10.2 12.5*  --  9.3 7.7 7.5 7.6 10.1  NEUTROABS 6.8 10.2*  --  7.7  --   --   --   --   HGB 8.6 Repeated and verified X2.* 8.1*  < > 7.5* 7.3* 9.1* 8.7* 9.6*  HCT 26.9* 26.8*  < > 25.3* 24.2* 29.5* 28.1* 31.5*  MCV 79.7 84.3  --  81.9 81.5 80.8 80.3 81.2  PLT 407.0* 405*  --  339 284 323 296 354  < > = values in this interval not displayed. Basic Metabolic Panel:  Recent Labs Lab 08/28/15 1825 08/28/15 1834 08/29/15 0409 08/30/15 0340 08/31/15 0259 09/01/15 0305  NA 137 140 135 137 139 135  K 3.6 3.6 3.5 3.5 4.4 4.7  CL 111 110 112* 111 112* 107  CO2 16*  --  17* 19* 21* 20*  GLUCOSE 115* 110* 194* 169* 123* 151*  BUN 32* 29* 31* 27* 27* 26*  CREATININE 2.48* 2.30* 2.27* 2.09* 1.89* 1.69*  CALCIUM 8.0*  --  7.7* 7.6* 8.2* 8.3*   GFR: Estimated Creatinine Clearance: 27.2 mL/min (by C-G formula based on Cr of 1.69). Liver Function Tests:  Recent Labs Lab 08/28/15 1825 08/29/15 0409 08/30/15 0340 08/31/15 0259 09/01/15 0305  AST 396* 195* 55* 46* 60*  ALT 286* 214* 109* 61* 45  ALKPHOS 457* 380* 315* 308* 294*  BILITOT 0.9 0.5 0.3 0.6 1.1  PROT 5.8* 5.3* 4.9* 5.3* 5.7*    ALBUMIN 2.1* 1.8* 1.7* 1.7* 1.8*   No results for input(s): LIPASE, AMYLASE in the last 168 hours. No results for input(s): AMMONIA in the last 168 hours. Coagulation Profile:  Recent Labs Lab 08/28/15 1825  INR 1.40   Cardiac Enzymes:  Recent Labs Lab 08/29/15 0409 08/29/15 1105 08/29/15 1526  CKTOTAL 36*  --   --   CKMB 2.1  --   --   TROPONINI <0.03 <0.03 <0.03   BNP (last 3 results) No results for input(s): PROBNP in the last 8760 hours. HbA1C: No results for input(s): HGBA1C in the last 72 hours. CBG:  Recent Labs Lab 08/31/15 2012 09/01/15 0001 09/01/15 0427 09/01/15 0815 09/01/15 1116  GLUCAP 155* 210* 162* 195* 250*   Lipid Profile: No results for input(s): CHOL, HDL, LDLCALC, TRIG, CHOLHDL, LDLDIRECT in the last 72 hours. Thyroid Function Tests: No results for input(s): TSH, T4TOTAL, FREET4, T3FREE, THYROIDAB in the last 72 hours. Anemia Panel: No results for input(s): VITAMINB12, FOLATE, FERRITIN, TIBC, IRON, RETICCTPCT in the last 72 hours. Urine analysis:    Component Value Date/Time   COLORURINE YELLOW 08/28/2015 1812   APPEARANCEUR TURBID* 08/28/2015 1812   LABSPEC 1.021 08/28/2015 1812   PHURINE 5.5 08/28/2015 1812   GLUCOSEU NEGATIVE 08/28/2015 1812   GLUCOSEU NEGATIVE 07/03/2015 1352   HGBUR LARGE* 08/28/2015 1812   BILIRUBINUR NEGATIVE 08/28/2015 1812   KETONESUR 15* 08/28/2015 1812   PROTEINUR >300* 08/28/2015 1812   UROBILINOGEN 0.2 07/03/2015 1352   NITRITE NEGATIVE 08/28/2015 1812   LEUKOCYTESUR LARGE* 08/28/2015 1812   Sepsis Labs: @LABRCNTIP (procalcitonin:4,lacticidven:4)   Recent Results (from the past 240 hour(s))  MRSA PCR Screening     Status: None   Collection Time: 08/29/15  6:11 AM  Result Value Ref Range Status   MRSA by PCR NEGATIVE NEGATIVE Final      Radiology Studies: Ct Chest Wo Contrast 08/29/2015   1. 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 The aorta measures 3.8 cm at the diaphragm. It is normal in caliber beginning at the level of the superior mesenteric artery. Recommend annual imaging followup by MRA or CTA, as deemed clinically appropriate. 2. Diffuse coronary artery calcifications seen. 3. Scattered calcification along the thoracic and proximal abdominal aorta, with calcification at the origin of the superior mesenteric artery. 4. Prominent high density at the left lung base reflects prior talc pleurodesis, with mild associated scarring. 5. Trace right-sided pleural fluid noted. 6. Two right-sided pulmonary nodules noted, measuring 8 mm and 5 mm. Non-contrast chest CT at 3-6 months is recommended. If the nodules are stable at time of repeat CT, then future CT at 18-24 months (from today's scan) is considered optional for low-risk patients, but is recommended for high-risk patients. 7. Cholelithiasis.  Gallbladder otherwise unremarkable. 8. Severe chronic left-sided hydronephrosis, with severe chronic left renal atrophy. Minimal right renal atrophy suggested, new from the prior study. 9. Chronic compression deformity involving vertebral body T12.  Mr Brain Wo Contrast 08/29/2015  1. No acute intracranial infarct or other process identified. 2. 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 Additional scattered remote small vessel lacunar infarcts. 3. Abnormal flow voids within the right ICA and distal left vertebral artery, which may be related to slow flow and/or occlusion. 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 Patchy slow flow was also present within the left vertebral artery at that time as well. 4. Age-related cerebral atrophy with moderate chronic small vessel ischemic disease. Electronically Signed   By: Jeannine Boga  M.D.   On: 08/29/2015 01:31   Mr Thoracic Spine Wo Contrast 08/28/2015  1. No thoracic spinal stenosis, despite a chronic T12 compression fracture. Very mild for age thoracic spine degenerative changes. 2. Advanced cervical spine degeneration suspected with probable cervical spinal stenosis. Cervical spine MRI would evaluate further. 3. 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 The descending thoracic aortic aneurysm diameters estimated at 4.3 cm on this exam Electronically Signed   By: Genevie Ann M.D.   On: 08/28/2015 20:23   Mr Lumbar Spine Wo Contrast 08/28/2015   1. Cholelithiasis AND Choledocholithiasis with dilated CBD up to 16 mm. See series 6, images 4 through 13. Recommend Gastroenterology consultation. 2. Fairly mild for age lumbar spine degeneration. Isolated moderate lumbar spinal stenosis at L4-L5 related to disc and posterior element degeneration. No acute osseous abnormality. 3. 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 There appears to be a right side ureteral stent in place. 4. See also thoracic spine MRI findings from today reported separately. Electronically Signed   By: Genevie Ann M.D.   On: 08/28/2015 20:39   US Abdomen Complete 08/29/2015   Cholelithiasis without sonographic evidence of acute cholecystitis. Mild right hydronephrosis. A ureteral stent is partially visualized in the right renal pelvis. Severe left hydronephrosis with parenchymal atrophy and cortical thinning of the left kidney. 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 CT may provide better evaluation of the aorta.    Scheduled Meds: . aspirin  81 mg Oral QHS  .  ceFAZolin (ANCEF) IV  500 mg Intravenous Q12H  . docusate sodium  100 mg Oral BID  . feeding supplement (GLUCERNA SHAKE)  237 mL Oral  TID BM  . insulin aspart  0-9 Units Subcutaneous TID WC  . latanoprost  1 drop Both Eyes QHS  . levothyroxine  125 mcg Oral QAC breakfast  . multivitamin with minerals  1 tablet Oral Daily  . pantoprazole  40 mg Oral Daily  . sodium chloride flush  3 mL Intravenous Q12H   Continuous Infusions:     LOS: 4 days    Time spent: 15 minutes  Greater than 50% of the time spent on counseling and coordinating the care.   Leisa Lenz, MD [facility] Pager [phone]  If 7PM-7AM, please contact night-coverage [email or web address] Password [email or web address] 09/01/2015, 3:20 PM

## 2015-09-02 ENCOUNTER — Ambulatory Visit: Payer: Medicare Other | Admitting: Endocrinology

## 2015-09-02 LAB — CBC
HCT: 31.5 % — ABNORMAL LOW (ref 36.0–46.0)
HEMOGLOBIN: 9.5 g/dL — AB (ref 12.0–15.0)
MCH: 24.7 pg — AB (ref 26.0–34.0)
MCHC: 30.2 g/dL (ref 30.0–36.0)
MCV: 81.8 fL (ref 78.0–100.0)
PLATELETS: 348 10*3/uL (ref 150–400)
RBC: 3.85 MIL/uL — ABNORMAL LOW (ref 3.87–5.11)
RDW: 16.9 % — AB (ref 11.5–15.5)
WBC: 9.4 10*3/uL (ref 4.0–10.5)

## 2015-09-02 LAB — BASIC METABOLIC PANEL
Anion gap: 10 (ref 5–15)
BUN: 27 mg/dL — AB (ref 6–20)
CALCIUM: 9.3 mg/dL (ref 8.9–10.3)
CO2: 24 mmol/L (ref 22–32)
CREATININE: 1.66 mg/dL — AB (ref 0.44–1.00)
Chloride: 107 mmol/L (ref 101–111)
GFR, EST AFRICAN AMERICAN: 34 mL/min — AB (ref >60)
GFR, EST NON AFRICAN AMERICAN: 29 mL/min — AB (ref >60)
Glucose, Bld: 163 mg/dL — ABNORMAL HIGH (ref 65–99)
Potassium: 4.3 mmol/L (ref 3.5–5.1)
SODIUM: 141 mmol/L (ref 135–145)

## 2015-09-02 LAB — GLUCOSE, CAPILLARY
GLUCOSE-CAPILLARY: 174 mg/dL — AB (ref 65–99)
GLUCOSE-CAPILLARY: 163 mg/dL — AB (ref 65–99)
Glucose-Capillary: 143 mg/dL — ABNORMAL HIGH (ref 65–99)
Glucose-Capillary: 211 mg/dL — ABNORMAL HIGH (ref 65–99)

## 2015-09-02 NOTE — Progress Notes (Signed)
Utilization review completed.  

## 2015-09-02 NOTE — Progress Notes (Signed)
Patient ID: [name] [name], female   DOB: Sep 03, 1940, 75 y.o.   MRN: [id number]  PROGRESS NOTE    [name] [name]  [id number] DOB: 09-14-1940 DOA: 08/28/2015  PCP: [name], MD   Brief Narrative:  75 y.o. Female with past medical history hypertension, diabetes, dyslipidemia, chronic kidney disease stage IV. She presented to Dr. Ronnie Derby office because of inability to walk. She was found to have low blood pressure and liver function enzymes were elevated. She was sent to ER for further evaluation.  In ED, patient was hemodynamically stable. Blood work was notable for leukocytosis of 12.5, hemoglobin 8.1, platelets 405, creatinine 2.48. Abdominal ultrasound showed cholelithiasis without sonographic evidence of acute cholecystitis. She has mild right hydronephrosis with ureteral stent and then severe left hydronephrosis. MRI of thoracic spine did not show spinal stenosis despite chronic T12 compression fracture. 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 She was also found to have 2 right-sided pulmonary nodules 8 mm and 5 mm and CT at 3-6 months is recommended for follow-up and if the nodules are stable at the time of repeat and further CT scan is suggested at 18-24 months for low risk patients option of but recommended for high-risk patients. 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 In regards to cholelithiasis, GI has seen her in consultation. Patient needs to be off of Plavix for 5 days prior to ERCP.  Transferred from SDU to telemetry floor 08/29/2015.   Assessment & Plan:   Hypotension with weakness  - Likely because of hypotension  - No evidence of spinal stenosis in the thoracic spine however she does probably have some stenosis in cervical spine  - Troponin level normal - Appreciate PT evaluation   Cholelithiasis - No sonographic evidence of  acute cholecystitis - LFT's improving  - AST 60, ALT 45, ALP 294 and normal bilirubin on 6/26 - ERCP planned for 6/28 as pt needs to be off of plavix for 5 days (plavix held from 6/23) - Acute hepatitis panel negative  Urinary tract infection / leukocytosis - Large leukocytes on admission with many bacteria - Stop ancef today  - Urine culture with multiple species none predominant   Hypothyroidism - Continue Synthroid - TSH within normal limits  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 She was on Bactrim which could have exacerbated kidney function. Bactrim on hold - Creatinine continues to improve, 1.6 on 6/29 - BMP pending this am  Anemia of chronic kidney disease - Hgb 7.3 on 6/24 - Given 1 U PRBC 6/24 - GI mentioned they may do EGD when they do ERCP but will follow up on their recomedations - Her hemoglobin is stable at 9.6 - CBC this am is pending   Severe protein calorie malnutrition - In the context of chronic illness - Seen by dietician    DVT prophylaxis: SCDs bilaterally  Code Status: full code  Family Communication: No family at the bedside  Disposition Plan: Home afer ERCP   Consultants:   GI  Procedures:   1 unit PRBC 6/25  ECHO 08/29/2015 - EF 55% and grade 2 diastolic dysfunction   Antimicrobials:   Cefazolin 08/28/2015 --> 09/01/2015   Subjective: No overnight events.  Objective: Filed Vitals:   09/01/15 0430 09/01/15 1316 09/01/15 2014 09/02/15 0427  BP: 141/55 141/50 153/87 140/67  Pulse: 80 68 70 72  Temp: 97.8 F (36.6 C) 97.8 F (36.6 C) 98 F (36.7 C) 97.7 F (36.5 C)  TempSrc: Oral Oral Oral  Oral  Resp: 18 18 20 18   Height:      Weight: 59.149 kg (130 lb 6.4 oz)   58.695 kg (129 lb 6.4 oz)  SpO2: 100% 100% 100% 99%    Intake/Output Summary (Last 24 hours) at 09/02/15 0647 Last data filed at 09/01/15 1300  Gross per 24 hour  Intake    600 ml  Output   1301 ml  Net   -701 ml   Filed Weights   08/31/15 0513 09/01/15 0430 09/02/15 0427  Weight: 59.467 kg (131 lb 1.6 oz) 59.149 kg (130 lb 6.4 oz) 58.695 kg (129 lb 6.4 oz)    Examination:  General exam: no acute distress  Respiratory system: bilateral air entry, no wheezing  Cardiovascular system: S1 & S2 appreciated, rate controlled  Gastrointestinal system: (+) BS, non tender  Central nervous system: Nonfocal  Extremities: no swelling, palpable pulses Skin: no lesions or ulcers  Psychiatry: Normal mood and behavior   Data Reviewed: I have personally reviewed following labs and imaging studies  CBC:  Recent Labs Lab 08/27/15 0841 08/28/15 1825  08/29/15 0409 08/30/15 0340 08/31/15 0008 08/31/15 0259 09/01/15 0305  WBC 10.2 12.5*  --  9.3 7.7 7.5 7.6 10.1  NEUTROABS 6.8 10.2*  --  7.7  --   --   --   --   HGB 8.6 Repeated and verified X2.* 8.1*  < > 7.5* 7.3* 9.1* 8.7* 9.6*  HCT 26.9* 26.8*  < > 25.3* 24.2* 29.5* 28.1* 31.5*  MCV 79.7 84.3  --  81.9 81.5 80.8 80.3 81.2  PLT 407.0* 405*  --  339 284 323 296 354  < > = values in this interval not displayed. Basic Metabolic Panel:  Recent Labs Lab 08/28/15 1825 08/28/15 1834 08/29/15 0409 08/30/15 0340 08/31/15 0259 09/01/15 0305  NA 137 140 135 137 139 135  K 3.6 3.6 3.5 3.5 4.4 4.7  CL 111 110 112* 111 112* 107  CO2 16*  --  17* 19* 21* 20*  GLUCOSE 115* 110* 194* 169* 123* 151*  BUN 32* 29* 31* 27* 27* 26*  CREATININE 2.48* 2.30* 2.27* 2.09* 1.89* 1.69*  CALCIUM 8.0*  --  7.7* 7.6* 8.2* 8.3*   GFR: Estimated Creatinine Clearance: 27.1 mL/min (by C-G formula based on Cr of 1.69). Liver Function Tests:  Recent Labs Lab 08/28/15 1825  08/29/15 0409 08/30/15 0340 08/31/15 0259 09/01/15 0305  AST 396* 195* 55* 46* 60*  ALT 286* 214* 109* 61* 45  ALKPHOS 457* 380* 315* 308* 294*  BILITOT 0.9 0.5 0.3 0.6 1.1  PROT 5.8* 5.3* 4.9* 5.3* 5.7*  ALBUMIN 2.1* 1.8* 1.7* 1.7* 1.8*   No results for input(s): LIPASE, AMYLASE in the last 168 hours. No results for input(s): AMMONIA in the last 168 hours. Coagulation Profile:  Recent Labs Lab 08/28/15 1825  INR 1.40   Cardiac Enzymes:  Recent Labs Lab 08/29/15 0409 08/29/15 1105 08/29/15 1526  CKTOTAL 36*  --   --   CKMB 2.1  --   --   TROPONINI <0.03 <0.03 <  0.03   BNP (last 3 results) No results for input(s): PROBNP in the last 8760 hours. HbA1C: No results for input(s): HGBA1C in the last 72 hours. CBG:  Recent Labs Lab 09/01/15 0815 09/01/15 1116 09/01/15 1616 09/01/15 2055 09/02/15 0610  GLUCAP 195* 250* 188* 184* 174*   Lipid Profile: No results for input(s): CHOL, HDL, LDLCALC, TRIG, CHOLHDL, LDLDIRECT in the last 72 hours. Thyroid Function Tests: No results for input(s): TSH, T4TOTAL, FREET4, T3FREE, THYROIDAB in the last 72 hours. Anemia Panel: No results for input(s): VITAMINB12, FOLATE, FERRITIN, TIBC, IRON, RETICCTPCT in the last 72 hours. Urine analysis:    Component Value Date/Time   COLORURINE YELLOW 08/28/2015 1812   APPEARANCEUR TURBID* 08/28/2015 1812   LABSPEC 1.021 08/28/2015 1812   PHURINE 5.5 08/28/2015 1812   GLUCOSEU NEGATIVE 08/28/2015 1812   GLUCOSEU NEGATIVE 07/03/2015 1352   HGBUR LARGE* 08/28/2015 1812   BILIRUBINUR NEGATIVE 08/28/2015 1812   KETONESUR 15* 08/28/2015 1812   PROTEINUR >300* 08/28/2015 1812   UROBILINOGEN 0.2 07/03/2015 1352   NITRITE NEGATIVE 08/28/2015 1812   LEUKOCYTESUR LARGE* 08/28/2015 1812   Sepsis Labs: @LABRCNTIP (procalcitonin:4,lacticidven:4)   Recent Results (from the past 240 hour(s))  MRSA PCR Screening     Status: None   Collection Time: 08/29/15  6:11 AM  Result Value Ref Range  Status   MRSA by PCR NEGATIVE NEGATIVE Final      Radiology Studies: Ct Chest Wo Contrast 08/29/2015   1. 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 The aorta measures 3.8 cm at the diaphragm. It is normal in caliber beginning at the level of the superior mesenteric artery. Recommend annual imaging followup by MRA or CTA, as deemed clinically appropriate. 2. Diffuse coronary artery calcifications seen. 3. Scattered calcification along the thoracic and proximal abdominal aorta, with calcification at the origin of the superior mesenteric artery. 4. Prominent high density at the left lung base reflects prior talc pleurodesis, with mild associated scarring. 5. Trace right-sided pleural fluid noted. 6. Two right-sided pulmonary nodules noted, measuring 8 mm and 5 mm. Non-contrast chest CT at 3-6 months is recommended. If the nodules are stable at time of repeat CT, then future CT at 18-24 months (from today's scan) is considered optional for low-risk patients, but is recommended for high-risk patients. 7. Cholelithiasis.  Gallbladder otherwise unremarkable. 8. Severe chronic left-sided hydronephrosis, with severe chronic left renal atrophy. Minimal right renal atrophy suggested, new from the prior study. 9. Chronic compression deformity involving vertebral body T12.  Mr Brain Wo Contrast 08/29/2015  1. No acute intracranial infarct or other process identified. 2. 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 Additional scattered remote small vessel lacunar infarcts. 3. Abnormal flow voids within the right ICA and distal left vertebral artery, which may be related to slow flow and/or occlusion. 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 Patchy  slow flow was also present within the left vertebral artery at that time as well. 4. Age-related cerebral atrophy with moderate chronic small vessel ischemic disease. Electronically Signed   By: Jeannine Boga M.D.   On: 08/29/2015 01:31   Mr Thoracic Spine Wo Contrast 08/28/2015  1. No thoracic spinal stenosis, despite a chronic T12 compression fracture. Very mild for age thoracic spine degenerative changes. 2.  Advanced cervical spine degeneration suspected with probable cervical spinal stenosis. Cervical spine MRI would evaluate further. 3. 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 The descending thoracic aortic aneurysm diameters estimated at 4.3 cm on this exam Electronically Signed   By: Genevie Ann M.D.   On: 08/28/2015 20:23   Mr Lumbar Spine Wo Contrast 08/28/2015   1. Cholelithiasis AND Choledocholithiasis with dilated CBD up to 16 mm. See series 6, images 4 through 13. Recommend Gastroenterology consultation. 2. Fairly mild for age lumbar spine degeneration. Isolated moderate lumbar spinal stenosis at L4-L5 related to disc and posterior element degeneration. No acute osseous abnormality. 3. 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 There appears to be a right side ureteral stent in place. 4. See also thoracic spine MRI findings from today reported separately. Electronically Signed   By: Genevie Ann M.D.   On: 08/28/2015 20:39   US Abdomen Complete 08/29/2015   Cholelithiasis without sonographic evidence of acute cholecystitis. Mild right hydronephrosis. A ureteral stent is partially visualized in the right renal pelvis. Severe left hydronephrosis with parenchymal atrophy and cortical thinning of the left kidney. 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 CT may provide better  evaluation of the aorta.    Scheduled Meds: . aspirin  81 mg Oral QHS  . docusate sodium  100 mg Oral BID  . feeding supplement (GLUCERNA SHAKE)  237 mL Oral TID BM  . insulin aspart  0-9 Units Subcutaneous TID WC  . latanoprost  1 drop Both Eyes QHS  . levothyroxine  125 mcg Oral QAC breakfast  . multivitamin with minerals  1 tablet Oral Daily  . pantoprazole  40 mg Oral Daily  . sodium chloride flush  3 mL Intravenous Q12H   Continuous Infusions:     LOS: 5 days    Time spent: 15 minutes  Greater than 50% of the time spent on counseling and coordinating the care.   Leisa Lenz, MD [facility] Pager [phone]  If 7PM-7AM, please contact night-coverage [email or web address] Password [facility] 09/02/2015, 6:47 AM

## 2015-09-02 NOTE — Evaluation (Signed)
Physical Therapy Evaluation Patient Details Name: [name] [name] MRN: [id number] DOB: 04-08-1940 Today's Date: 09/02/2015   History of Present Illness  Pt adm with hypotension, weakness, and Cholelithiasis. PMH - PVD, AAA, CKD, CAD, DM  Clinical Impression  Pt admitted with above diagnosis and presents to PT with functional limitations due to deficits listed below (See PT problem list). Pt needs skilled PT to maximize independence and safety to allow discharge to home. Expect pt will progress with mobility and be ready to return home. Plans to spend a few days with her daughter. Doubt will need any follow up PT after dc.    Follow Up Recommendations No PT follow up    Equipment Recommendations  None recommended by PT    Recommendations for Other Services       Precautions / Restrictions Precautions Precautions: Fall      Mobility  Bed Mobility               General bed mobility comments: Pt up in chair  Transfers Overall transfer level: Needs assistance Equipment used: 4-wheeled walker Transfers: Sit to/from Stand Sit to Stand: Min assist;Supervision         General transfer comment: Assist to bring hips up from low recliner. When seat elevated with pillows pt then able to stand with supervisio  Ambulation/Gait Ambulation/Gait assistance: Supervision;Min guard Ambulation Distance (Feet): 175 Feet Assistive device: 4-wheeled walker Gait Pattern/deviations: Step-through pattern;Decreased stride length     General Gait Details: Pt initially slightly unsteady but improved with amb distance  Stairs            Wheelchair Mobility    Modified Rankin (Stroke Patients Only)       Balance Overall balance assessment: Needs assistance Sitting-balance support: No upper extremity supported;Feet supported Sitting balance-Leahy Scale: Good     Standing balance support: No upper extremity supported Standing balance-Leahy Scale: Fair                                Pertinent Vitals/Pain Pain Assessment: No/denies pain    Home Living Family/patient expects to be discharged to:: Private residence Living Arrangements: Alone Available Help at Discharge: Family;Available PRN/intermittently Type of Home: Apartment Home Access: Stairs to enter   Entrance Stairs-Number of Steps: 1 Home Layout: One level Home Equipment: Walker - 2 wheels;Walker - 4 wheels;Bedside commode;Tub bench;Grab bars - tub/shower;Hand held shower head Additional Comments: Pt plans to stay with daughter for a few days after DC. Daughter has 1 level home with one step to enter.    Prior Function Level of Independence: Independent with assistive device(s)               Hand Dominance        Extremity/Trunk Assessment   Upper Extremity Assessment: Overall WFL for tasks assessed           Lower Extremity Assessment: Generalized weakness         Communication   Communication: No difficulties  Cognition Arousal/Alertness: Awake/alert Behavior During Therapy: WFL for tasks assessed/performed Overall Cognitive Status: Within Functional Limits for tasks assessed                      General Comments      Exercises        Assessment/Plan    PT Assessment Patient needs continued PT services  PT Diagnosis Difficulty walking;Generalized weakness   PT Problem List Decreased  strength;Decreased balance;Decreased mobility;Decreased activity tolerance  PT Treatment Interventions DME instruction;Gait training;Functional mobility training;Therapeutic activities;Therapeutic exercise;Balance training;Patient/family education   PT Goals (Current goals can be found in the Care Plan section) Acute Rehab PT Goals Patient Stated Goal: return home PT Goal Formulation: With patient Time For Goal Achievement: 09/09/15 Potential to Achieve Goals: Good    Frequency Min 3X/week   Barriers to discharge        Co-evaluation                End of Session Equipment Utilized During Treatment: Gait belt Activity Tolerance: Patient tolerated treatment well Patient left: in chair;with call bell/phone within reach Nurse Communication: Mobility status         Time: [id number] PT Time Calculation (min) (ACUTE ONLY): 10 min   Charges:   PT Evaluation $PT Eval Low Complexity: 1 Procedure     PT G Codes:        Monique Hefty 2015-09-27, 11:03 AM Suanne Marker PT [phone]

## 2015-09-02 NOTE — Progress Notes (Signed)
    Progress Note   Subjective  [name] [name] is a 75 y/o Caucasian female who we consulted on 08/29/15 regarding normocytic anemia and elevated LFT's as well as dyspepsia.  This morning, the patient denies abdominal pain, she tells me she did have and episode of nausea and vomiting last night after drinking an "randge Boost shake", but describes her vomitus as acidicy and having a feeling of indigestion, she believes this is related. She did have a BM this morning which was somewhat larger and softer.   Objective   Vital signs in last 24 hours: Temp:  [97.7 F (36.5 C)-98 F (36.7 C)] 97.7 F (36.5 C) (06/27 0427) Pulse Rate:  [68-72] 72 (06/27 0427) Resp:  [18-20] 18 (06/27 0427) BP: (140-153)/(50-87) 140/67 mmHg (06/27 0427) SpO2:  [99 %-100 %] 99 % (06/27 0427) Weight:  [129 lb 6.4 oz (58.695 kg)] 129 lb 6.4 oz (58.695 kg) (06/27 0427) Last BM Date: 09/01/15 General: Somewhat frail appearing caucasian female in NAD Heart:  Regular rate and rhythm; no murmurs Lungs: Respirations even and unlabored, lungs CTA bilaterally Abdomen:  Soft, nontender and nondistended. Normal bowel sounds. Extremities:  Without edema. Neurologic:  Alert and oriented,  grossly normal neurologically. Psych:  Cooperative. Normal mood and affect.  Intake/Output from previous day: 06/26 0701 - 06/27 0700 In: 600 [P.O.:600] Out: 1301 [Urine:1300; Stool:1] Intake/Output this shift: Total I/O In: 240 [P.O.:240] Out: 301 [Urine:300; Stool:1]  Lab Results:  Recent Labs  08/31/15 0259 09/01/15 0305 09/02/15 0723  WBC 7.6 10.1 9.4  HGB 8.7* 9.6* 9.5*  HCT 28.1* 31.5* 31.5*  PLT 296 354 348   BMET  Recent Labs  08/31/15 0259 09/01/15 0305 09/02/15 0723  NA 139 135 141  K 4.4 4.7 4.3  CL 112* 107 107  CO2 21* 20* 24  GLUCOSE 123* 151* 163*  BUN 27* 26* 27*  CREATININE 1.89* 1.69* 1.66*  CALCIUM 8.2* 8.3* 9.3   LFT  Recent Labs  09/01/15 0305  PROT 5.7*  ALBUMIN 1.8*  AST 60*  ALT  45  ALKPHOS 294*  BILITOT 1.1   PT/INR No results for input(s): LABPROT, INR in the last 72 hours.  Studies/Results: No results found.     Assessment / Plan:   Assessment: 1. Choledocholithiasis, cholelithiasis, 63mm CBD: Confirmed on u/s 6/23. Elevated LFT's improving-much improved, plans for ERCP tomorrow 2. AKI, CKD: hydronephrosis, previous ureteral stents 3. ASPVD, AAA-s/p multiple vascular surgeries, Plavix last dosed 6/22 4. Malnutrition  Plan: 1. ERCP scheduled for tomorrow at 1300-all orders are in including indocin and pre-op abx 2. Patient to remain NPO after midnight 3. Continue bowel regimen 4. Continue to hold Plavix 5. Will discuss above with Dr. Silverio Decamp, please await any further recs  Thank you for your kind consultation, we will continue to follow Ellouise Newer, PA-C  Active Problems:   UTI (lower urinary tract infection)   Diabetes mellitus, type II ([place])   Elevated LFTs   Hypotension   Pressure ulcer   Protein-calorie malnutrition, severe   Abnormal liver function   Choledocholithiasis   Nausea without vomiting     LOS: 5 days   Levin Erp  09/02/2015, 10:33 AM  Pager # [phone]

## 2015-09-03 ENCOUNTER — Encounter (HOSPITAL_COMMUNITY)
Admission: EM | Disposition: A | Discharge status: Home / Self Care | Payer: Self-pay | Source: Home / Self Care | Attending: Internal Medicine

## 2015-09-03 ENCOUNTER — Inpatient Hospital Stay (HOSPITAL_COMMUNITY): Payer: Medicare Other | Admitting: Certified Registered Nurse Anesthetist

## 2015-09-03 ENCOUNTER — Inpatient Hospital Stay (HOSPITAL_COMMUNITY): Payer: Medicare Other

## 2015-09-03 ENCOUNTER — Encounter (HOSPITAL_COMMUNITY): Payer: Self-pay | Admitting: *Deleted

## 2015-09-03 DIAGNOSIS — Z794 Long term (current) use of insulin: Secondary | ICD-10-CM

## 2015-09-03 DIAGNOSIS — R945 Abnormal results of liver function studies: Secondary | ICD-10-CM

## 2015-09-03 DIAGNOSIS — K838 Other specified diseases of biliary tract: Secondary | ICD-10-CM

## 2015-09-03 DIAGNOSIS — E43 Unspecified severe protein-calorie malnutrition: Secondary | ICD-10-CM

## 2015-09-03 DIAGNOSIS — R1011 Right upper quadrant pain: Secondary | ICD-10-CM

## 2015-09-03 DIAGNOSIS — I9589 Other hypotension: Secondary | ICD-10-CM

## 2015-09-03 DIAGNOSIS — E118 Type 2 diabetes mellitus with unspecified complications: Secondary | ICD-10-CM

## 2015-09-03 DIAGNOSIS — N390 Urinary tract infection, site not specified: Secondary | ICD-10-CM

## 2015-09-03 HISTORY — PX: ERCP: SHX5425

## 2015-09-03 LAB — GLUCOSE, CAPILLARY
Glucose-Capillary: 178 mg/dL — ABNORMAL HIGH (ref 65–99)
Glucose-Capillary: 168 mg/dL — ABNORMAL HIGH (ref 65–99)
Glucose-Capillary: 230 mg/dL — ABNORMAL HIGH (ref 65–99)
Glucose-Capillary: 152 mg/dL — ABNORMAL HIGH (ref 65–99)

## 2015-09-03 LAB — TYPE AND SCREEN
ABO/RH(D): O POS
Antibody Screen: POSITIVE
DAT, IGG: NEGATIVE
DONOR AG TYPE: NEGATIVE
DONOR AG TYPE: NEGATIVE
PT AG TYPE: NEGATIVE
Unit division: 0
Unit division: 0

## 2015-09-03 SURGERY — ERCP, WITH INTERVENTION IF INDICATED
Anesthesia: General

## 2015-09-03 MED ORDER — ONDANSETRON HCL 4 MG/2ML IJ SOLN
INTRAMUSCULAR | Status: DC | PRN
  Administered 2015-09-03: 4 mg via INTRAVENOUS

## 2015-09-03 MED ORDER — INDOMETHACIN 50 MG RE SUPP
RECTAL | Status: AC
  Filled 2015-09-03: qty 1

## 2015-09-03 MED ORDER — LACTATED RINGERS IV SOLN
INTRAVENOUS | Status: DC | PRN
  Administered 2015-09-03: 13:00:00 via INTRAVENOUS

## 2015-09-03 MED ORDER — GLUCAGON HCL RDNA (DIAGNOSTIC) 1 MG IJ SOLR
INTRAMUSCULAR | Status: DC | PRN
  Administered 2015-09-03: .5 mg via INTRAVENOUS

## 2015-09-03 MED ORDER — INDOMETHACIN 50 MG RE SUPP: 100.0000 mg | Freq: Once | RECTAL | Status: AC

## 2015-09-03 MED ORDER — FENTANYL CITRATE (PF) 100 MCG/2ML IJ SOLN
INTRAMUSCULAR | Status: DC | PRN
  Administered 2015-09-03: 50 ug via INTRAVENOUS

## 2015-09-03 MED ORDER — IOPAMIDOL (ISOVUE-300) INJECTION 61%
INTRAVENOUS | Status: AC
  Filled 2015-09-03: qty 50

## 2015-09-03 MED ORDER — FENTANYL CITRATE (PF) 100 MCG/2ML IJ SOLN: 25.0000 ug | INTRAMUSCULAR | Status: DC | PRN

## 2015-09-03 MED ORDER — ONDANSETRON HCL 4 MG/2ML IJ SOLN: 4.0000 mg | Freq: Four times a day (QID) | INTRAMUSCULAR | Status: DC | PRN

## 2015-09-03 MED ORDER — PHENOL 1.4 % MT LIQD
1.0000 | OROMUCOSAL | Status: DC | PRN
  Administered 2015-09-03: 1 via OROMUCOSAL
  Filled 2015-09-03: qty 177

## 2015-09-03 MED ORDER — INDOMETHACIN 50 MG RE SUPP
RECTAL | Status: DC | PRN
  Administered 2015-09-03: 100 mg via RECTAL

## 2015-09-03 MED ORDER — SODIUM CHLORIDE 0.9 % IV SOLN
INTRAVENOUS | Status: DC | PRN
  Administered 2015-09-03: 40 mL

## 2015-09-03 MED ORDER — GLUCAGON HCL RDNA (DIAGNOSTIC) 1 MG IJ SOLR
INTRAMUSCULAR | Status: AC
Start: 2015-09-03 — End: 2015-09-03
  Filled 2015-09-03: qty 1

## 2015-09-03 MED ORDER — SUCCINYLCHOLINE CHLORIDE 20 MG/ML IJ SOLN
INTRAMUSCULAR | Status: DC | PRN
  Administered 2015-09-03: 100 mg via INTRAVENOUS

## 2015-09-03 MED ORDER — OXYCODONE HCL 5 MG PO TABS: 5.0000 mg | ORAL_TABLET | Freq: Once | ORAL | Status: DC | PRN

## 2015-09-03 MED ORDER — OXYCODONE HCL 5 MG/5ML PO SOLN
5.0000 mg | Freq: Once | ORAL | Status: DC | PRN
  Filled 2015-09-03: qty 5

## 2015-09-03 MED ORDER — SODIUM CHLORIDE 0.9 % IV SOLN
1.5000 g | INTRAVENOUS | Status: AC
  Administered 2015-09-03: 1.5 g via INTRAVENOUS
  Filled 2015-09-03: qty 1.5

## 2015-09-03 MED ORDER — LIDOCAINE HCL (CARDIAC) 20 MG/ML IV SOLN
INTRAVENOUS | Status: DC | PRN
  Administered 2015-09-03: 70 mg via INTRAVENOUS

## 2015-09-03 MED ORDER — PHENYLEPHRINE HCL 10 MG/ML IJ SOLN
INTRAMUSCULAR | Status: DC | PRN
  Administered 2015-09-03 (×2): 40 ug via INTRAVENOUS
  Administered 2015-09-03: 80 ug via INTRAVENOUS
  Administered 2015-09-03: 40 ug via INTRAVENOUS

## 2015-09-03 MED ORDER — PROPOFOL 10 MG/ML IV BOLUS
INTRAVENOUS | Status: DC | PRN
  Administered 2015-09-03: 110 mg via INTRAVENOUS

## 2015-09-03 MED ORDER — SODIUM CHLORIDE 0.9 % IV SOLN: INTRAVENOUS | Status: DC

## 2015-09-03 NOTE — Progress Notes (Signed)
Dr. Ola Spurr in to assess patient for shortness of breath and increased work of breathing. Order received for STAT portable chest xray. Patient will remain in endoscopy recovery until this is resulted.

## 2015-09-03 NOTE — Interval H&P Note (Signed)
History and Physical Interval Note  Patient's case reviewed with Dr. Hilarie Fredrickson and Dr. Silverio Decamp. Chart, laboratories, and x-rays reviewed. Patient personally seen and examined this afternoon. Patient's daughter in room. For ERCP today for symptomatic cholelithiasis in a medically complicated 75 year old. Has been off Plavix 6 days.  09/03/2015 1:25 PM  [name] [name]  has presented today for surgery, with the diagnosis of choledocholithiasis.  anorexia, wt loss.  n/v.  The various methods of treatment have been discussed with the patient and family. After consideration of risks (specifically discussed pancreatitis, bleeding, perforation, and failed procedure completion), benefits and other options for treatment, the patient has consented to  Procedure(s): ENDOSCOPIC RETROGRADE CHOLANGIOPANCREATOGRAPHY (ERCP) (N/A) as a surgical intervention .  The patient's history has been reviewed, patient examined, no change in status, stable for surgery.  I have reviewed the patient's chart and labs.  Questions were answered to the patient's satisfaction.     Scarlette Shorts

## 2015-09-03 NOTE — Progress Notes (Signed)
PROGRESS NOTE    [name] [name]  [id number] DOB: 02/25/1941 DOA: 08/28/2015 PCP: [name] Snare, MD   Brief Narrative:  75 y.o. Female with past medical history hypertension, diabetes, dyslipidemia, chronic kidney disease stage IV. She presented to Dr. Ronnie [name] office because of inability to walk. She was found to have low blood pressure and liver function enzymes were elevated. She was sent to ER for further evaluation.  In ED, patient was hemodynamically stable. Blood work was notable for leukocytosis of 12.5, hemoglobin 8.1, platelets 405, creatinine 2.48. Abdominal ultrasound showed cholelithiasis without sonographic evidence of acute cholecystitis. She has mild right hydronephrosis with ureteral stent and then severe left hydronephrosis. MRI of thoracic spine did not show spinal stenosis despite chronic T12 compression fracture. 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 She was also found to have 2 right-sided pulmonary nodules 8 mm and 5 mm and CT at 3-6 months is recommended for follow-up and if the nodules are stable at the time of repeat and further CT scan is suggested at 18-24 months for low risk patients option of but recommended for high-risk patients. 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 In regards to cholelithiasis, GI has seen her in consultation. Patient needs to be off of Plavix for 5 days prior to ERCP.  Transferred from SDU to telemetry floor 08/29/2015. Assessment & Plan   Hypotension with weakness  -Hypotension resolved -No evidence of spinal stenosis in the thoracic spine however she does probably have some stenosis in cervical spine  -Troponin level normal -PT evaluated patient, no further therapy needed  Cholelithiasis -No sonographic evidence of acute cholecystitis -LFT's improving  -AST 60, ALT 45, ALP 294 and normal  bilirubin on 6/26 -Plavix held 5 days (starting 6/23) for ERCP, today -Acute hepatitis panel negative  Urinary tract infection / leukocytosis -Large leukocytes on admission with many bacteria -Ancef discontinued -Urine culture with multiple species none predominant   Hypothyroidism -Continue Synthroid -TSH within normal limits  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 She was on Bactrim which could have exacerbated kidney function. Bactrim on hold -Creatinine continues to improve, 1.6 on 6/27 -Continue to monitor BMP  Anemia of chronic kidney disease -Hgb 7.3 on 6/24 -Given 1 U PRBC 6/24 -GI mentioned they may do EGD when they do ERCP but will follow up on their recomedations -Her hemoglobin is stable at 9.5 -Continue to monitor CBC  Severe protein calorie malnutrition -In the context of chronic illness -Seen by dietician   DVT Prophylaxis  SCDs  Code Status: Full  Family Communication: None at bedside  Disposition Plan: Admitted. Pending ERCP today  Consultants Gastroenterology   Procedures  Echocardiogram  Antibiotics   Anti-infectives    Start     Dose/Rate Route Frequency Ordered Stop   09/03/15 1300  [MAR Hold]  ampicillin-sulbactam (UNASYN) 1.5 g in sodium chloride 0.9 % 50 mL IVPB     (MAR Hold since 09/03/15 1219)   1.5 g 100 mL/hr over 30 Minutes Intravenous To [facility] 09/03/15 0733 09/04/15 1300   08/29/15 0000  ceFAZolin (ANCEF) 500 mg in dextrose 5 % 100 mL  IVPB  Status:  Discontinued     500 mg 210 mL/hr over 30 Minutes Intravenous Every 12 hours 08/28/15 2307 09/01/15 1623   08/28/15 2230  sulfamethoxazole-trimethoprim (BACTRIM DS,SEPTRA DS)  800-160 MG per tablet 1 tablet     1 tablet Oral  Once 08/28/15 2215 08/28/15 2232      Subjective:   [name] [name] seen and examined today.  Patient states she had some abdominal pain and back pain last night. Denies nausea or vomiting at this time. Denies chest pain, shortness of breath.    Objective:   Filed Vitals:   09/02/15 1346 09/03/15 0410 09/03/15 1209 09/03/15 1226  BP: 130/75 146/57 137/55 154/77  Pulse: 71 68 80 72  Temp: 97.5 F (36.4 C) 97.6 F (36.4 C) 97.7 F (36.5 C) 97.6 F (36.4 C)  TempSrc: Oral Oral Oral Oral  Resp: 18 18 17 20   Height:      Weight:  59.058 kg (130 lb 3.2 oz)    SpO2: 100% 98% 100% 99%    Intake/Output Summary (Last 24 hours) at 09/03/15 1257 Last data filed at 09/02/15 2116  Gross per 24 hour  Intake    363 ml  Output      0 ml  Net    363 ml   Filed Weights   09/01/15 0430 09/02/15 0427 09/03/15 0410  Weight: 59.149 kg (130 lb 6.4 oz) 58.695 kg (129 lb 6.4 oz) 59.058 kg (130 lb 3.2 oz)    Exam  General: Well developed, well nourished, NAD, appears stated age  64: NCAT,  mucous membranes moist.   Cardiovascular: S1 S2 auscultated, RRR  Respiratory: Clear to auscultation bilaterally   Abdomen: Soft, RUQ TTP, nondistended, + bowel sounds  Extremities: warm dry without cyanosis clubbing or edema  Neuro: AAOx3, nonfocal  Psych: Normal affect and demeanor    Data Reviewed: I have personally reviewed following labs and imaging studies  CBC:  Recent Labs Lab 08/28/15 1825  08/29/15 0409 08/30/15 0340 08/31/15 0008 08/31/15 0259 09/01/15 0305 09/02/15 0723  WBC 12.5*  --  9.3 7.7 7.5 7.6 10.1 9.4  NEUTROABS 10.2*  --  7.7  --   --   --   --   --   HGB 8.1*  < > 7.5* 7.3* 9.1* 8.7* 9.6* 9.5*  HCT 26.8*  < > 25.3* 24.2* 29.5* 28.1* 31.5* 31.5*  MCV 84.3  --  81.9 81.5 80.8 80.3 81.2 81.8  PLT 405*  --  339 284 323 296 354 348  < > = values in this interval not displayed. Basic Metabolic Panel:  Recent  Labs Lab 08/29/15 0409 08/30/15 0340 08/31/15 0259 09/01/15 0305 09/02/15 0723  NA 135 137 139 135 141  K 3.5 3.5 4.4 4.7 4.3  CL 112* 111 112* 107 107  CO2 17* 19* 21* 20* 24  GLUCOSE 194* 169* 123* 151* 163*  BUN 31* 27* 27* 26* 27*  CREATININE 2.27* 2.09* 1.89* 1.69* 1.66*  CALCIUM 7.7* 7.6* 8.2* 8.3* 9.3   GFR: Estimated Creatinine Clearance: 27.7 mL/min (by C-G formula based on Cr of 1.66). Liver Function Tests:  Recent Labs Lab 08/28/15 1825 08/29/15 0409 08/30/15 0340 08/31/15 0259 09/01/15 0305  AST 396* 195* 55* 46* 60*  ALT 286* 214* 109* 61* 45  ALKPHOS 457* 380* 315* 308* 294*  BILITOT 0.9 0.5 0.3 0.6 1.1  PROT 5.8* 5.3* 4.9* 5.3* 5.7*  ALBUMIN 2.1* 1.8* 1.7* 1.7* 1.8*   No results for input(s): LIPASE, AMYLASE  in the last 168 hours. No results for input(s): AMMONIA in the last 168 hours. Coagulation Profile:  Recent Labs Lab 08/28/15 1825  INR 1.40   Cardiac Enzymes:  Recent Labs Lab 08/29/15 0409 08/29/15 1105 08/29/15 1526  CKTOTAL 36*  --   --   CKMB 2.1  --   --   TROPONINI <0.03 <0.03 <0.03   BNP (last 3 results) No results for input(s): PROBNP in the last 8760 hours. HbA1C: No results for input(s): HGBA1C in the last 72 hours. CBG:  Recent Labs Lab 09/02/15 1120 09/02/15 1638 09/02/15 2103 09/03/15 0617 09/03/15 1136  GLUCAP 163* 143* 211* 178* 168*   Lipid Profile: No results for input(s): CHOL, HDL, LDLCALC, TRIG, CHOLHDL, LDLDIRECT in the last 72 hours. Thyroid Function Tests: No results for input(s): TSH, T4TOTAL, FREET4, T3FREE, THYROIDAB in the last 72 hours. Anemia Panel: No results for input(s): VITAMINB12, FOLATE, FERRITIN, TIBC, IRON, RETICCTPCT in the last 72 hours. Urine analysis:    Component Value Date/Time   COLORURINE YELLOW 08/28/2015 1812   APPEARANCEUR TURBID* 08/28/2015 1812   LABSPEC 1.021 08/28/2015 1812   PHURINE 5.5 08/28/2015 1812   GLUCOSEU NEGATIVE 08/28/2015 1812   GLUCOSEU NEGATIVE  07/03/2015 1352   HGBUR LARGE* 08/28/2015 1812   BILIRUBINUR NEGATIVE 08/28/2015 1812   KETONESUR 15* 08/28/2015 1812   PROTEINUR >300* 08/28/2015 1812   UROBILINOGEN 0.2 07/03/2015 1352   NITRITE NEGATIVE 08/28/2015 1812   LEUKOCYTESUR LARGE* 08/28/2015 1812   Sepsis Labs: @LABRCNTIP (procalcitonin:4,lacticidven:4)  ) Recent Results (from the past 240 hour(s))  Urine culture     Status: Abnormal   Collection Time: 08/28/15  6:31 PM  Result Value Ref [name] Status   Specimen Description URINE, RANDOM  Final   Special Requests Immunocompromised  Final   Culture MULTIPLE SPECIES PRESENT, SUGGEST RECOLLECTION (A)  Final   Report Status 08/30/2015 FINAL  Final  MRSA PCR Screening     Status: None   Collection Time: 08/29/15  6:11 AM  Result Value Ref [name] Status   MRSA by PCR NEGATIVE NEGATIVE Final    Comment:        The GeneXpert MRSA Assay (FDA approved for NASAL specimens only), is one component of a comprehensive MRSA colonization surveillance program. It is not intended to diagnose MRSA infection nor to guide or monitor treatment for MRSA infections.       Radiology Studies: No results found.   Scheduled Meds: . [MAR Hold] ampicillin-sulbactam (UNASYN) 1.5 g IVPB  1.5 g Intravenous To SS-Surg  . [MAR Hold] aspirin  81 mg Oral QHS  . [MAR Hold] docusate sodium  100 mg Oral BID  . [MAR Hold] feeding supplement (GLUCERNA SHAKE)  237 mL Oral TID BM  . [MAR Hold] insulin aspart  0-9 Units Subcutaneous TID WC  . [MAR Hold] latanoprost  1 drop Both Eyes QHS  . [MAR Hold] levothyroxine  125 mcg Oral QAC breakfast  . [MAR Hold] multivitamin with minerals  1 tablet Oral Daily  . [MAR Hold] pantoprazole  40 mg Oral Daily  . [MAR Hold] sodium chloride flush  3 mL Intravenous Q12H   Continuous Infusions: . sodium chloride       LOS: 6 days   Time Spent in minutes   30 minutes  Jrue Yambao D.O. on 09/03/2015 at 12:57 PM  Between 7am to 7pm - Pager -  [phone]  After 7pm go to [email or web address] - password [email or web address]  And look for the night coverage person covering for me  after hours  [facility] [employer]  [phone]

## 2015-09-03 NOTE — Anesthesia Postprocedure Evaluation (Signed)
Anesthesia Post Note  Patient: [name] [name]  Procedure(s) Performed: Procedure(s) (LRB): ENDOSCOPIC RETROGRADE CHOLANGIOPANCREATOGRAPHY (ERCP) (N/A)  Patient location during evaluation: Endoscopy Anesthesia Type: General Level of consciousness: awake and alert Pain management: pain level controlled Vital Signs Assessment: post-procedure vital signs reviewed and stable Respiratory status: spontaneous breathing, nonlabored ventilation, respiratory function stable and patient connected to nasal cannula oxygen Cardiovascular status: blood pressure returned to baseline and stable Postop Assessment: no signs of nausea or vomiting Anesthetic complications: no    Last Vitals:  Filed Vitals:   09/03/15 1530 09/03/15 1540  BP: 202/88 179/87  Pulse: 78   Temp:    Resp: 28     Last Pain:  Filed Vitals:   09/03/15 1544  PainSc: 0-No pain                 Catalina Gravel

## 2015-09-03 NOTE — Op Note (Signed)
[facility] Patient Name: [name] [name] Procedure Date : 09/03/2015 MRN: [id number] Attending MD: Docia Chuck. Henrene Pastor , MD Date of Birth: January 03, 1941 CSN: [id number] Age: 75 Admit Type: Inpatient Procedure:                ERCP, with sphincterotomy and common bile duct                            stones removal Indications:              Abdominal pain in the right upper quadrant, Bile                            duct stone on Computed Tomogram Scan, Abnormal                            liver function test Providers:                Docia Chuck. Henrene Pastor, MD, Kingsley Plan, RN, Elspeth Cho, Technician Referring MD:             [facility] Medicines:                General Anesthesia Complications:            No immediate complications. Estimated Blood Loss:     Estimated blood loss: none. Procedure:                Pre-Anesthesia Assessment:                           - Prior to the procedure, a History and Physical                            was performed, and patient medications and                            allergies were reviewed. The patient is competent.                            The risks and benefits of the procedure and the                            sedation options and risks were discussed with the                            patient. All questions were answered and informed                            consent was obtained. Patient identification and                            proposed procedure were verified by the physician.  Mental Status Examination: alert and oriented.                            Airway Examination: normal oropharyngeal airway and                            neck mobility. Respiratory Examination: clear to                            auscultation. CV Examination: normal. Prophylactic                            Antibiotics: The patient does not require                            prophylactic  antibiotics. Prior Anticoagulants: The                            patient has taken Plavix (clopidogrel), last dose                            was 6 days prior to procedure. ASA Grade                            Assessment: III - A patient with severe systemic                            disease. After reviewing the risks and benefits,                            the patient was deemed in satisfactory condition to                            undergo the procedure. The anesthesia plan was to                            use general anesthesia. Immediately prior to                            administration of medications, the patient was                            re-assessed for adequacy to receive sedatives. The                            heart rate, respiratory rate, oxygen saturations,                            blood pressure, adequacy of pulmonary ventilation,                            and response to care were monitored throughout the  procedure. The physical status of the patient was                            re-assessed after the procedure.                           After obtaining informed consent, the scope was                            passed under direct vision. Throughout the                            procedure, the patient's blood pressure, pulse, and                            oxygen saturations were monitored continuously. The                            [id number]) scope was introduced through                            the mouth, and used to inject contrast into and                            used to inject contrast into the bile duct. The                            ERCP was accomplished without difficulty. The                            patient tolerated the procedure well. Scope In: Scope Out: Findings:      The esophagus was successfully intubated under direct vision. The distal       esophagus revealed a large caliber ring without  inflammation. Stomach       was normal. The duodenum was normal. The scope was advanced to a normal       major papilla in the descending duodenum. The bile duct was deeply       cannulated with the traction (standard) sphincterotome on the first       pass. Contrast was injected. Opacification of the entire biliary tree       except for the cystic duct and gallbladder was successful. The entire       biliary tree was moderately dilated. The largest diameter was 15 mm. A       0.035 inch straight Glidewire was passed into the biliary tree. Biliary       sphincterotomy was made with a traction (standard) sphincterotome using       ERBE electrocautery. The sphincterotomy oozed blood. The biliary tree       was swept with an 18 mm balloon starting at the bifurcation. All stones       were removed (multiple cholesterol type up to 12 mm). No stones       remained. There was no manipulation or injection of the pancreatic duct. Impression:               - The distal esophagus, stomach,  and duodenum were                            normal                           - The entire biliary tree was moderately dilated                            with multiple stones up to 12 mm.                           - Choledocholithiasis. Complete removal was                            accomplished by biliary sphincterotomy and balloon                            extraction. Recommendation:           - Full liquid diet. Advance as tolerated.                           - Resume Plavix (clopidogrel) at prior dose in 1                            week, if medically reasonable to reduce the risk of                            post sphincterotomy bleed. Sooner if not. Primary                            service to decide.                           - General surgical consultation for consideration                            of laparoscopic cholecystectomy                           Discussed with patient and daughter. Procedure                             report provided to them                           . Procedure Code(s):        --- Professional ---                           [phone], Endoscopic retrograde                            cholangiopancreatography (ERCP); with removal of                            calculi/debris from biliary/pancreatic  duct(s)                           [id number], Endoscopic retrograde                            cholangiopancreatography (ERCP); with                            sphincterotomy/papillotomy Diagnosis Code(s):        --- Professional ---                           K80.50, Calculus of bile duct without cholangitis                            or cholecystitis without obstruction                           R10.11, Right upper quadrant pain                           R94.5, Abnormal results of liver function studies                           K83.8, Other specified diseases of biliary tract CPT copyright 2016 American Medical Association. All rights reserved. The codes documented in this report are preliminary and upon coder review may  be revised to meet current compliance requirements. Docia Chuck. Henrene Pastor, MD 09/03/2015 2:34:57 PM This report has been signed electronically. Number of Addenda: 0

## 2015-09-03 NOTE — Transfer of Care (Signed)
Immediate Anesthesia Transfer of Care Note  Patient: [name] Rho  Procedure(s) Performed: Procedure(s): ENDOSCOPIC RETROGRADE CHOLANGIOPANCREATOGRAPHY (ERCP) (N/A)  Patient Location: Endoscopy Unit  Anesthesia Type:General  Level of Consciousness: awake, alert , oriented, patient cooperative and responds to stimulation  Airway & Oxygen Therapy: Patient Spontanous Breathing and Patient connected to nasal cannula oxygen  Post-op Assessment: Report given to RN, Post -op Vital signs reviewed and stable and Patient moving all extremities X 4  Post vital signs: Reviewed and stable  Last Vitals:  Filed Vitals:   09/03/15 1209 09/03/15 1226  BP: 137/55 154/77  Pulse: 80 72  Temp: 36.5 C 36.4 C  Resp: 17 20    Last Pain:  Filed Vitals:   09/03/15 1231  PainSc: 0-No pain      Patients Stated Pain Goal: 0 ([id number])  Complications: No apparent anesthesia complications

## 2015-09-03 NOTE — Care Management Important Message (Signed)
Important Message  Patient Details  Name: [name] [name] MRN: [id number] Date of Birth: [birth year]   Medicare Important Message Given:  Yes    Loann Quill 09/03/2015, 10:01 AM

## 2015-09-03 NOTE — H&P (View-Only) (Signed)
    Progress Note   Subjective  Mrs. Cardosa is a 75 y/o Caucasian female who we consulted on 08/29/15 regarding normocytic anemia and elevated LFT's as well as dyspepsia.  This morning, the patient denies abdominal pain, she tells me she did have and episode of nausea and vomiting last night after drinking an "randge Boost shake", but describes her vomitus as acidicy and having a feeling of indigestion, she believes this is related. She did have a BM this morning which was somewhat larger and softer.   Objective   Vital signs in last 24 hours: Temp:  [97.7 F (36.5 C)-98 F (36.7 C)] 97.7 F (36.5 C) (06/27 0427) Pulse Rate:  [68-72] 72 (06/27 0427) Resp:  [18-20] 18 (06/27 0427) BP: (140-153)/(50-87) 140/67 mmHg (06/27 0427) SpO2:  [99 %-100 %] 99 % (06/27 0427) Weight:  [129 lb 6.4 oz (58.695 kg)] 129 lb 6.4 oz (58.695 kg) (06/27 0427) Last BM Date: 09/01/15 General: Somewhat frail appearing caucasian female in NAD Heart:  Regular rate and rhythm; no murmurs Lungs: Respirations even and unlabored, lungs CTA bilaterally Abdomen:  Soft, nontender and nondistended. Normal bowel sounds. Extremities:  Without edema. Neurologic:  Alert and oriented,  grossly normal neurologically. Psych:  Cooperative. Normal mood and affect.  Intake/Output from previous day: 06/26 0701 - 06/27 0700 In: 600 [P.O.:600] Out: 1301 [Urine:1300; Stool:1] Intake/Output this shift: Total I/O In: 240 [P.O.:240] Out: 301 [Urine:300; Stool:1]  Lab Results:  Recent Labs  08/31/15 0259 09/01/15 0305 09/02/15 0723  WBC 7.6 10.1 9.4  HGB 8.7* 9.6* 9.5*  HCT 28.1* 31.5* 31.5*  PLT 296 354 348   BMET  Recent Labs  08/31/15 0259 09/01/15 0305 09/02/15 0723  NA 139 135 141  K 4.4 4.7 4.3  CL 112* 107 107  CO2 21* 20* 24  GLUCOSE 123* 151* 163*  BUN 27* 26* 27*  CREATININE 1.89* 1.69* 1.66*  CALCIUM 8.2* 8.3* 9.3   LFT  Recent Labs  09/01/15 0305  PROT 5.7*  ALBUMIN 1.8*  AST 60*  ALT  45  ALKPHOS 294*  BILITOT 1.1   PT/INR No results for input(s): LABPROT, INR in the last 72 hours.  Studies/Results: No results found.     Assessment / Plan:   Assessment: 1. Choledocholithiasis, cholelithiasis, 36mm CBD: Confirmed on u/s 6/23. Elevated LFT's improving-much improved, plans for ERCP tomorrow 2. AKI, CKD: hydronephrosis, previous ureteral stents 3. ASPVD, AAA-s/p multiple vascular surgeries, Plavix last dosed 6/22 4. Malnutrition  Plan: 1. ERCP scheduled for tomorrow at 1300-all orders are in including indocin and pre-op abx 2. Patient to remain NPO after midnight 3. Continue bowel regimen 4. Continue to hold Plavix 5. Will discuss above with Dr. Silverio Decamp, please await any further recs  Thank you for your kind consultation, we will continue to follow Ellouise Newer, PA-C  Active Problems:   UTI (lower urinary tract infection)   Diabetes mellitus, type II ([place])   Elevated LFTs   Hypotension   Pressure ulcer   Protein-calorie malnutrition, severe   Abnormal liver function   Choledocholithiasis   Nausea without vomiting     LOS: 5 days   Levin Erp  09/02/2015, 10:33 AM  Pager # [phone]

## 2015-09-03 NOTE — Anesthesia Preprocedure Evaluation (Signed)
Anesthesia Evaluation  Patient identified by MRN, date of birth, ID band Patient awake    Reviewed: Allergy & Precautions, NPO status , Patient's Chart, lab work & pertinent test results  Airway Mallampati: II   Neck ROM: full    Dental   Pulmonary former smoker,    breath sounds clear to auscultation       Cardiovascular hypertension, + CAD, + CABG, + Peripheral Vascular Disease, +CHF and + DOE   Rhythm:regular Rate:Normal     Neuro/Psych    GI/Hepatic GERD  ,  Endo/Other  diabetes, Type 2Hypothyroidism   Renal/GU Renal InsufficiencyRenal disease     Musculoskeletal  (+) Arthritis ,   Abdominal   Peds  Hematology   Anesthesia Other Findings   Reproductive/Obstetrics                             Anesthesia Physical Anesthesia Plan  ASA: III  Anesthesia Plan: General   Post-op Pain Management:    Induction: Intravenous  Airway Management Planned: Oral ETT  Additional Equipment:   Intra-op Plan:   Post-operative Plan: Extubation in OR  Informed Consent: I have reviewed the patients History and Physical, chart, labs and discussed the procedure including the risks, benefits and alternatives for the proposed anesthesia with the patient or authorized representative who has indicated his/her understanding and acceptance.     Plan Discussed with: CRNA, Anesthesiologist and Surgeon  Anesthesia Plan Comments:         Anesthesia Quick Evaluation

## 2015-09-03 NOTE — Anesthesia Procedure Notes (Signed)
Procedure Name: Intubation Date/Time: 09/03/2015 1:45 PM Performed by: Tressia Miners LEFFEW Pre-anesthesia Checklist: Patient identified, Patient being monitored, Timeout performed, Emergency Drugs available and Suction available Patient Re-evaluated:Patient Re-evaluated prior to inductionOxygen Delivery Method: Circle System Utilized Preoxygenation: Pre-oxygenation with 100% oxygen Intubation Type: IV induction Ventilation: Mask ventilation without difficulty Laryngoscope Size: Mac and 3 Grade View: Grade I Tube type: Oral Tube size: 7.5 mm Number of attempts: 1 Airway Equipment and Method: Stylet Placement Confirmation: ETT inserted through vocal cords under direct vision,  positive ETCO2 and breath sounds checked- equal and bilateral Secured at: 23 cm Tube secured with: Tape Dental Injury: Teeth and Oropharynx as per pre-operative assessment

## 2015-09-03 NOTE — Progress Notes (Signed)
Dr. Ola Spurr notified of chest xray result. He advised ok for patient to leave endo recovery and return to room.

## 2015-09-04 ENCOUNTER — Encounter (HOSPITAL_COMMUNITY): Payer: Self-pay | Admitting: Internal Medicine

## 2015-09-04 DIAGNOSIS — I779 Disorder of arteries and arterioles, unspecified: Secondary | ICD-10-CM

## 2015-09-04 DIAGNOSIS — R531 Weakness: Secondary | ICD-10-CM

## 2015-09-04 DIAGNOSIS — K8011 Calculus of gallbladder with chronic cholecystitis with obstruction: Secondary | ICD-10-CM

## 2015-09-04 DIAGNOSIS — I739 Peripheral vascular disease, unspecified: Secondary | ICD-10-CM

## 2015-09-04 DIAGNOSIS — I2510 Atherosclerotic heart disease of native coronary artery without angina pectoris: Secondary | ICD-10-CM

## 2015-09-04 DIAGNOSIS — Z01810 Encounter for preprocedural cardiovascular examination: Secondary | ICD-10-CM

## 2015-09-04 DIAGNOSIS — R52 Pain, unspecified: Secondary | ICD-10-CM | POA: Insufficient documentation

## 2015-09-04 LAB — COMPREHENSIVE METABOLIC PANEL
ALBUMIN: 2 g/dL — AB (ref 3.5–5.0)
ALT: 60 U/L — AB (ref 14–54)
ANION GAP: 8 (ref 5–15)
AST: 45 U/L — AB (ref 15–41)
Alkaline Phosphatase: 222 U/L — ABNORMAL HIGH (ref 38–126)
BUN: 30 mg/dL — ABNORMAL HIGH (ref 6–20)
CHLORIDE: 109 mmol/L (ref 101–111)
CO2: 22 mmol/L (ref 22–32)
Calcium: 9.1 mg/dL (ref 8.9–10.3)
Creatinine, Ser: 1.52 mg/dL — ABNORMAL HIGH (ref 0.44–1.00)
GFR calc non Af Amer: 33 mL/min — ABNORMAL LOW (ref >60)
GFR, EST AFRICAN AMERICAN: 38 mL/min — AB (ref >60)
Glucose, Bld: 144 mg/dL — ABNORMAL HIGH (ref 65–99)
POTASSIUM: 5 mmol/L (ref 3.5–5.1)
Sodium: 139 mmol/L (ref 135–145)
Total Bilirubin: 0.5 mg/dL (ref 0.3–1.2)
Total Protein: 5.7 g/dL — ABNORMAL LOW (ref 6.5–8.1)

## 2015-09-04 LAB — GLUCOSE, CAPILLARY
GLUCOSE-CAPILLARY: 174 mg/dL — AB (ref 65–99)
GLUCOSE-CAPILLARY: 135 mg/dL — AB (ref 65–99)
Glucose-Capillary: 153 mg/dL — ABNORMAL HIGH (ref 65–99)
Glucose-Capillary: 175 mg/dL — ABNORMAL HIGH (ref 65–99)

## 2015-09-04 LAB — CBC
HCT: 30.2 % — ABNORMAL LOW (ref 36.0–46.0)
Hemoglobin: 9.4 g/dL — ABNORMAL LOW (ref 12.0–15.0)
MCH: 25.9 pg — ABNORMAL LOW (ref 26.0–34.0)
MCHC: 31.1 g/dL (ref 30.0–36.0)
MCV: 83.2 fL (ref 78.0–100.0)
PLATELETS: 367 10*3/uL (ref 150–400)
RBC: 3.63 MIL/uL — ABNORMAL LOW (ref 3.87–5.11)
RDW: 17 % — AB (ref 11.5–15.5)
WBC: 9 10*3/uL (ref 4.0–10.5)

## 2015-09-04 NOTE — Progress Notes (Signed)
PROGRESS NOTE    [name] [name]  [id number] DOB: 12/14/1940 DOA: 08/28/2015 PCP: [name], MD   Brief Narrative:  75 y.o. Female with past medical history hypertension, diabetes, dyslipidemia, chronic kidney disease stage IV. She presented to Dr. Ronnie Derby office because of inability to walk. She was found to have low blood pressure and liver function enzymes were elevated. She was sent to ER for further evaluation.  In ED, patient was hemodynamically stable. Blood work was notable for leukocytosis of 12.5, hemoglobin 8.1, platelets 405, creatinine 2.48. Abdominal ultrasound showed cholelithiasis without sonographic evidence of acute cholecystitis. She has mild right hydronephrosis with ureteral stent and then severe left hydronephrosis. MRI of thoracic spine did not show spinal stenosis despite chronic T12 compression fracture. 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 She was also found to have 2 right-sided pulmonary nodules 8 mm and 5 mm and CT at 3-6 months is recommended for follow-up and if the nodules are stable at the time of repeat and further CT scan is suggested at 18-24 months for low risk patients option of but recommended for high-risk patients. 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 In regards to cholelithiasis, GI has seen her in consultation. Patient needs to be off of Plavix for 5 days prior to ERCP.  Assessment & Plan   Hypotension with weakness  -Hypotension resolved -No evidence of spinal stenosis in the thoracic spine however she does probably have some stenosis in cervical spine  -Troponin level normal -PT evaluated patient, no further therapy needed  Cholelithiasis/Choledocolithiasis  -No sonographic evidence of acute cholecystitis -LFT's improving  -AST 60, ALT 45, ALP 294 and normal bilirubin on 6/26 -Plavix held 5  days (starting 6/23) for ERCP with sphincterotomy and stone extraction on 6/28:  -Acute hepatitis panel negative -General surgery consulted and appreciated for cholecystectomy   Urinary tract infection / leukocytosis -Leukocytosis resolved -Large leukocytes on admission with many bacteria -Ancef discontinued -Urine culture with multiple species none predominant   Hypothyroidism -Continue Synthroid -TSH within normal limits  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 She was on Bactrim which could have exacerbated kidney function. Bactrim on hold -Creatinine continues to improve, 1.52 -Continue to monitor BMP  Anemia of chronic kidney disease -Hgb 7.3 on 6/24 -Given 1 U PRBC 6/24 -GI mentioned they may do EGD when they do ERCP but will follow up on their recomedations -Her hemoglobin is stable at 9.4 -Continue to monitor CBC  Severe protein calorie malnutrition -In the context of chronic illness -Seen by dietician   DVT Prophylaxis  SCDs  Code Status: Full  Family Communication: None at bedside  Disposition Plan: Admitted. Pending surgery consult   Consultants Gastroenterology  General surgery  Procedures  Echocardiogram ERCP  Antibiotics   Anti-infectives    Start     Dose/Rate Route Frequency Ordered Stop   09/03/15 1300  [MAR Hold]  ampicillin-sulbactam (UNASYN) 1.5 g in sodium chloride 0.9 % 50 mL IVPB     (MAR Hold since 09/03/15 1219)   1.5 g 100 mL/hr over 30 Minutes Intravenous To [facility]Stay [facility] 09/03/15 0733 09/03/15 1347  08/29/15 0000  ceFAZolin (ANCEF) 500 mg in dextrose 5 % 100 mL IVPB  Status:  Discontinued     500 mg 210 mL/hr over 30 Minutes  Intravenous Every 12 hours 08/28/15 2307 09/01/15 1623   08/28/15 2230  sulfamethoxazole-trimethoprim (BACTRIM DS,SEPTRA DS) 800-160 MG per tablet 1 tablet     1 tablet Oral  Once 08/28/15 2215 08/28/15 2232      Subjective:   [name] [name] seen and examined today.  Patient was able to tolerate liquid diet.  Denies current abdominal pain, nausea, vomiting. Does complain of some back pain.  Denies chest pain, shortness of breath.    Objective:   Filed Vitals:   09/03/15 1600 09/03/15 1615 09/03/15 2010 09/04/15 0613  BP: 162/67  141/58 134/61  Pulse: 75  65 72  Temp: 97.6 F (36.4 C)  97.6 F (36.4 C) 97.3 F (36.3 C)  TempSrc: Oral  Oral Oral  Resp:   18 18  Height:      Weight:    58.559 kg (129 lb 1.6 oz)  SpO2: 100% 100% 100% 100%    Intake/Output Summary (Last 24 hours) at 09/04/15 1140 Last data filed at 09/04/15 0841  Gross per 24 hour  Intake    600 ml  Output    106 ml  Net    494 ml   Filed Weights   09/02/15 0427 09/03/15 0410 09/04/15 0613  Weight: 58.695 kg (129 lb 6.4 oz) 59.058 kg (130 lb 3.2 oz) 58.559 kg (129 lb 1.6 oz)    Exam  General: Well developed, well nourished, no distress  HEENT: NCAT,  mucous membranes moist.   Cardiovascular: S1 S2 auscultated, RRR  Respiratory: Clear to auscultation bilaterally   Abdomen: Soft, nontender, nondistended, + bowel sounds, +hernia  Extremities: warm dry without cyanosis clubbing or edema  Neuro: AAOx3, nonfocal  Psych: Normal affect and demeanor, pleasant   Data Reviewed: I have personally reviewed following labs and imaging studies  CBC:  Recent Labs Lab 08/28/15 1825  08/29/15 0409  08/31/15 0008 08/31/15 0259 09/01/15 0305 09/02/15 0723 09/04/15 0218  WBC 12.5*  --  9.3  < > 7.5 7.6 10.1 9.4 9.0  NEUTROABS 10.2*  --  7.7  --   --   --   --   --   --   HGB 8.1*  < > 7.5*  < > 9.1* 8.7* 9.6* 9.5* 9.4*  HCT 26.8*  < > 25.3*  < > 29.5* 28.1* 31.5* 31.5* 30.2*  MCV 84.3  --  81.9  < >  80.8 80.3 81.2 81.8 83.2  PLT 405*  --  339  < > 323 296 354 348 367  < > = values in this interval not displayed. Basic Metabolic Panel:  Recent Labs Lab 08/30/15 0340 08/31/15 0259 09/01/15 0305 09/02/15 0723 09/04/15 0218  NA 137 139 135 141 139  K 3.5 4.4 4.7 4.3 5.0  CL 111 112* 107 107 109  CO2 19* 21* 20* 24 22  GLUCOSE 169* 123* 151* 163* 144*  BUN 27* 27* 26* 27* 30*  CREATININE 2.09* 1.89* 1.69* 1.66* 1.52*  CALCIUM 7.6* 8.2* 8.3* 9.3 9.1   GFR: Estimated Creatinine Clearance: 30 mL/min (by C-G formula based on Cr of 1.52). Liver Function Tests:  Recent Labs Lab 08/29/15 0409 08/30/15 0340 08/31/15 0259 09/01/15 0305 09/04/15 0218  AST 195* 55* 46* 60* 45*  ALT 214* 109* 61* 45 60*  ALKPHOS 380* 315* 308* 294* 222*  BILITOT  0.5 0.3 0.6 1.1 0.5  PROT 5.3* 4.9* 5.3* 5.7* 5.7*  ALBUMIN 1.8* 1.7* 1.7* 1.8* 2.0*   No results for input(s): LIPASE, AMYLASE in the last 168 hours. No results for input(s): AMMONIA in the last 168 hours. Coagulation Profile:  Recent Labs Lab 08/28/15 1825  INR 1.40   Cardiac Enzymes:  Recent Labs Lab 08/29/15 0409 08/29/15 1105 08/29/15 1526  CKTOTAL 36*  --   --   CKMB 2.1  --   --   TROPONINI <0.03 <0.03 <0.03   BNP (last 3 results) No results for input(s): PROBNP in the last 8760 hours. HbA1C: No results for input(s): HGBA1C in the last 72 hours. CBG:  Recent Labs Lab 09/03/15 0617 09/03/15 1136 09/03/15 1609 09/03/15 2104 09/04/15 0617  GLUCAP 178* 168* 230* 152* 153*   Lipid Profile: No results for input(s): CHOL, HDL, LDLCALC, TRIG, CHOLHDL, LDLDIRECT in the last 72 hours. Thyroid Function Tests: No results for input(s): TSH, T4TOTAL, FREET4, T3FREE, THYROIDAB in the last 72 hours. Anemia Panel: No results for input(s): VITAMINB12, FOLATE, FERRITIN, TIBC, IRON, RETICCTPCT in the last 72 hours. Urine analysis:    Component Value Date/Time   COLORURINE YELLOW 08/28/2015 1812   APPEARANCEUR TURBID*  08/28/2015 1812   LABSPEC 1.021 08/28/2015 1812   PHURINE 5.5 08/28/2015 1812   GLUCOSEU NEGATIVE 08/28/2015 1812   GLUCOSEU NEGATIVE 07/03/2015 1352   HGBUR LARGE* 08/28/2015 1812   BILIRUBINUR NEGATIVE 08/28/2015 1812   KETONESUR 15* 08/28/2015 1812   PROTEINUR >300* 08/28/2015 1812   UROBILINOGEN 0.2 07/03/2015 1352   NITRITE NEGATIVE 08/28/2015 1812   LEUKOCYTESUR LARGE* 08/28/2015 1812   Sepsis Labs: @LABRCNTIP (procalcitonin:4,lacticidven:4)  ) Recent Results (from the past 240 hour(s))  Urine culture     Status: Abnormal   Collection Time: 08/28/15  6:31 PM  Result Value Ref Range Status   Specimen Description URINE, RANDOM  Final   Special Requests Immunocompromised  Final   Culture MULTIPLE SPECIES PRESENT, SUGGEST RECOLLECTION (A)  Final   Report Status 08/30/2015 FINAL  Final  MRSA PCR Screening     Status: None   Collection Time: 08/29/15  6:11 AM  Result Value Ref Range Status   MRSA by PCR NEGATIVE NEGATIVE Final    Comment:        The GeneXpert MRSA Assay (FDA approved for NASAL specimens only), is one component of a comprehensive MRSA colonization surveillance program. It is not intended to diagnose MRSA infection nor to guide or monitor treatment for MRSA infections.       Radiology Studies: Dg Chest Port 1 View  09/03/2015  CLINICAL DATA:  Shortness of breath EXAM: PORTABLE CHEST 1 VIEW COMPARISON:  CT 08/28/2015 FINDINGS: Prior CABG. Left basilar scarring. Heart is normal size. No acute confluent airspace opacities or effusions. No acute bony abnormality. IMPRESSION: No active disease. Electronically Signed   By: Rolm Baptise M.D.   On: 09/03/2015 15:34   Dg Ercp  09/03/2015  CLINICAL DATA:  75 year old female with cholelithiasis and choledocholithiasis EXAM: ERCP TECHNIQUE: Multiple spot images obtained with the fluoroscopic device and submitted for interpretation post-procedure. FLUOROSCOPY TIME:  If the device does not provide the exposure index:  Fluoroscopy Time: 2 minutes reported. Please see GI note for further detail. Number of Acquired Images:  0 COMPARISON:  Right upper quadrant ultrasound 08/29/2015 FINDINGS: A total of 7 intraoperative spot images demonstrate a flexible endoscope in the descending duodenum followed by cannulation of the common bile duct and cholangiogram. Multiple faceted filling defects  are present within the knee markedly dilated common duct concerning for choledocholithiasis. Subsequent images demonstrate sphincterotomy and balloon sweep of the common bile duct. Incidentally, there is a right-sided double-J ureteral stent which is partially imaged. IMPRESSION: 1. Choledocholithiasis with moderate intra and extrahepatic biliary ductal dilatation. 2. ERCP with sphincterotomy and balloon sweep of the bile duct. These images were submitted for radiologic interpretation only. Please see the procedural report for the amount of contrast and the fluoroscopy time utilized. Electronically Signed   By: Jacqulynn Cadet M.D.   On: 09/03/2015 14:50     Scheduled Meds: . aspirin  81 mg Oral QHS  . docusate sodium  100 mg Oral BID  . feeding supplement (GLUCERNA SHAKE)  237 mL Oral TID BM  . insulin aspart  0-9 Units Subcutaneous TID WC  . latanoprost  1 drop Both Eyes QHS  . levothyroxine  125 mcg Oral QAC breakfast  . multivitamin with minerals  1 tablet Oral Daily  . pantoprazole  40 mg Oral Daily  . sodium chloride flush  3 mL Intravenous Q12H   Continuous Infusions:     LOS: 7 days   Time Spent in minutes   30 minutes  Ethaniel Garfield D.O. on 09/04/2015 at 11:40 AM  Between 7am to 7pm - Pager - [phone]  After 7pm go to [email or web address] - password [email or web address]  And look for the night coverage person covering for me after hours  [facility] Office  [phone]

## 2015-09-04 NOTE — Progress Notes (Signed)
Nutrition Follow Up  DOCUMENTATION CODES:   Severe malnutrition in context of chronic illness  INTERVENTION:    Continue Glucerna Shake po TID, each supplement provides 220 kcal and 10 grams of protein   Continue MVI po daily  NUTRITION DIAGNOSIS:   Malnutrition related to chronic illness as evidenced by energy intake < or equal to 75% for > or equal to 1 month, severe depletion of body fat, severe depletion of muscle mass, ongoing  GOAL:   Patient will meet greater than or equal to 90% of their needs, progressing  MONITOR:   PO intake, Supplement acceptance, Labs, Weight trends, Skin, I & O's  ASSESSMENT:   [name] [name] is a 75 y.o. female, w/ Dm2, CAD, AAA, CHF (EF 50-55%), CKD stage 4, Diverticulosis apparently was at Dr. Ronnie Derby office and could not walk and her bp was low and her liver function was abnormal along with her hemoglobin. Pt was sent to ER for evaluation of weakness in her bilateral legs , as well as numbness " like my legs went to sleep" Pt states numbness has disappeared. Pt has chronic back pain. Pt denies headache, vision change, dysarthria, fever, chills, n/v, abd pain, diarrhea, brbpr. Pt notes n/v x3 5 days ago.   Pt admitted with UTI and hypotension secondary to opoid use with impaired renal function.  PO intake variable at 50-75% per flowsheet records. Malnutrition ongoing >> receiving Glucerna Shakes TID. GI note 6/29 reviewed >> plan to advance to Regular diet.  Diet Order:  Diet full liquid Room service appropriate?: Yes; Fluid consistency:: Thin  Skin:  Wound (see comment) (stage II sacrum)  Last BM:  6/28  Height:   Ht Readings from Last 1 Encounters:  08/29/15 5\' 6"  (1.676 m)    Weight:   Wt Readings from Last 1 Encounters:  09/04/15 129 lb 1.6 oz (58.559 kg)    Ideal Body Weight:  59.1 kg  BMI:  Body mass index is 20.85 kg/(m^2).  Estimated Nutritional Needs:   Kcal:  1600-1800  Protein:  80-95 grams  Fluid:  >1.6  L  EDUCATION NEEDS:   No education needs identified at this time  Arthur Holms, RD, LDN Pager #: [phone] After-Hours Pager #: [phone]

## 2015-09-04 NOTE — Consult Note (Signed)
CARDIOLOGY CONSULT NOTE       Patient ID: [name] [name] MRN: [id number] DOB/AGE: 1940-07-25 75 y.o.  Admit date: 08/28/2015 Referring Physician:  Ree Kida Primary Physician: [name], MD Primary Cardiologist:  Gwenlyn Found Reason for Consultation:  Preop  Active Problems:   UTI (lower urinary tract infection)   Diabetes mellitus, type II ([place])   Elevated LFTs   Hypotension   Pressure ulcer   Protein-calorie malnutrition, severe   Abnormal liver function   Choledocholithiasis   Nausea without vomiting   Pain   Weakness   HPI:  The patient is a 75 y.o.  mildly overweight, divorced Caucasian female, She has a history of CAD status post coronary artery bypass grafting x6 in 1997. She has had bilateral carotid endarterectomies, aortobifemoral bypass grafting and right fem-pop bypass grafting with lifestyle-limiting claudication. Cath performed by Dr. [name] December 09, 2009, revealed an occluded vein to an OM and ramus branches with a patent vein to a PDA and a patent LIMA to the LAD. Her EF was 40% at that time. She quit smoking November 2011. She had an echo performed March 25, 2010, that showed normal LV systolic function and a Myoview performed November 24, 2011, which was nonischemic. She denies chest pain or shortness of breath. Her last lower extremity Dopplers performed in our office October 05, 2011, revealed a right ABI of 0.45 with an occluded fem-pop bypass graft and a left ABI of 0.59 with an occluded left SFA.  She is admitted to hospital with sepsis and cholecystitis.  Surgery is scheduled already with Dr Marlou Starks ofr 2:00 tomorrow  She has had no chest pain, dyspnea, palpitations or syncope. She does have stable moderate claudication worse in the RLE  She can do all ADL's and still drives Can do more than 5 METS Proposed moderate risk lap choly.     ROS All other systems reviewed and negative except as noted above  Past Medical History  Diagnosis Date  . CHF (congestive  heart failure) ([place])   . Hypertension   . Hyperlipidemia   . Hypothyroidism   . Ischemic cardiomyopathy     03-25-2010-- per lasts echo EF  50-55%  . Coronary artery disease due to lipid rich plaque cardiologist-  dr berry    s/p CABG x6 1997-- cath 12-09-2009 occluded vein to obtuse marginal branch and ramus branch with patent vien to PDA and patent LIMA to LAD, ef 40%-- Myoview 11-24-2011, nonischemic  . Type 2 diabetes mellitus ([place])     monitored by dr Dwyane Dee  . Retroperitoneal fibrosis   . Bilateral hydronephrosis   . CKD (chronic kidney disease), stage III   . History of sepsis     10-18-2014 w/ acute pyelonephritis  . Dyspnea on exertion   . GERD (gastroesophageal reflux disease)   . PVD (peripheral vascular disease) with claudication ([place])     last duplex 07-04-2015 -- Right CCA and ICA chronic occlusion, [id number] LICA, Patent vertebral arteries w/ antegrade flow, bilateral normal subclavian arteries   . Carotid artery stenosis     carotid doppler 06/2012 - Right CCA/Bulb/ICA with chronic occlusion; L vertebral artery with abnormal blood flow; L Bulb/Prox ICA  s/p endarterectomy with mild fibrous plaque, 50% diameter reduction  . PAD (peripheral artery disease) ([place])     09/2010 LEAs - R ABI of 0.45, occluded fem-pop bypass graft, L ABI of 0.59 with occluded SFA; severe arterial insuff    Family History  Problem Relation Age of Onset  . Congestive Heart  Failure Mother   . Diabetes Mother   . Stroke Father   . Cancer Maternal Aunt     Breast cancer    Social History   Social History  . Marital Status: Divorced    Spouse Name: N/A  . Number of Children: 1  . Years of Education: 12   Occupational History  . Not on file.   Social History Main Topics  . Smoking status: Former Smoker -- 2.00 packs/day for 30 years    Quit date: 06/07/2009  . Smokeless tobacco: Never Used  . Alcohol Use: 1.8 oz/week    3 Standard drinks or equivalent per week     Comment: occasuional  .  Drug Use: No  . Sexual Activity: Not on file   Other Topics Concern  . Not on file   Social History Narrative    Past Surgical History  Procedure Laterality Date  . Cystoscopy w/ ureteral stent placement  03/10/2012    Procedure: CYSTOSCOPY WITH RETROGRADE PYELOGRAM/URETERAL STENT PLACEMENT;  Surgeon: Hanley Ben, MD;  Location: [facility];  Service: Urology;  Laterality: Left;  . Carotid endarterectomy Bilateral right 1994//  left ?  Marland Kitchen Transthoracic echocardiogram  03/25/2010    EF 99991111, LV systolic function low normal with mild inferoseptal hypocontractility; LA mildly dilated; mod MR; mild TR, RV systolic pressure elevated, mild pulm HTN; AV mildly sclerotic; mild pulm valve regurg; aortic root sclerosis/calcif   . Coronary artery bypass graft  1997    x6; internal mammary to LAD, SVG to ramus #1 & #2, SVG to OM, SVG to PDA,   . Cardiac catheterization  12-09-2009  dr al little    EF >40%-- occluded vein to OM & ramus branches, patent vein to PDA, patent LIMA to LAD (Dr. [name], [facility]) - later had thrombectomy of R fem-pop bypass ad R common femoral & profunda femoris artery (Dr. Oneida Alar)  . Repair right femoral pseudoaneuysm/  right fem-pop bypass graft/  debridement right lower extremitiy venous status ulcers x2  01-12-2005  . Aorta - bilateral femoral artery bypass graft  1997    and RIGHT FEM-POP   . Cardiovascular stress test  11-24-2011   dr berry    Low Risk study: fixed basal to mid inferior attenuation artifact, no reversible ischemia,  normal LV function and wall motion , ef 67%  . Total abdominal hysterectomy w/ bilateral salpingoophorectomy  1986  . Cataract extraction w/ intraocular lens  implant, bilateral  2006  . Cystoscopy w/ ureteral stent placement Bilateral 03/07/2015    Procedure: BILATERAL RETROGRADE PYELOGRAM AND RIGHT URETERAL STENT PLACEMENT;  Surgeon: Ardis Hughs, MD;  Location: [facility];  Service: Urology;  Laterality: Bilateral;  .  Cystoscopy w/ ureteral stent placement Right 08/01/2015    Procedure: CYSTOSCOPY WITH STENT REPLACEMENT;  Surgeon: Ardis Hughs, MD;  Location[facility];  Service: Urology;  Laterality: Right;  . Cystoscopy w/ retrogrades Right 08/01/2015    Procedure: CYSTOSCOPY WITH RETROGRADE PYELOGRAM;  Surgeon: Ardis Hughs, MD;  [facility];  Service: Urology;  Laterality: Right;  . Ercp N/A 09/03/2015    Procedure: ENDOSCOPIC RETROGRADE CHOLANGIOPANCREATOGRAPHY (ERCP);  Surgeon: Irene Shipper, MD;  Location: [facility];  Service: Endoscopy;  Laterality: N/A;     . aspirin  81 mg Oral QHS  . docusate sodium  100 mg Oral BID  . feeding supplement (GLUCERNA SHAKE)  237 mL Oral TID BM  . insulin aspart  0-9 Units Subcutaneous  TID WC  . latanoprost  1 drop Both Eyes QHS  . levothyroxine  125 mcg Oral QAC breakfast  . multivitamin with minerals  1 tablet Oral Daily  . pantoprazole  40 mg Oral Daily  . sodium chloride flush  3 mL Intravenous Q12H      Physical Exam: Blood pressure 158/40, pulse 66, temperature 97.4 F (36.3 C), temperature source Oral, resp. rate 17, height 5\' 6"  (1.676 m), weight 129 lb 1.6 oz (58.559 kg), SpO2 100 %.   Affect appropriate Chronically ill white female  HEENT: normal Neck supple with no adenopathy JVP normal bilateral  Bruits with CEA;s  no thyromegaly Lungs clear with no wheezing and good diaphragmatic motion Heart:  S1/S2  SEM  murmur, no rub, gallop or click PMI normal Abdomen: benighn, BS positve, no tenderness, post aortobifem With bilateral femoral bruits and decreased DP/PT bialterally  No HSM or HJR Distal pulses intact with no bruits No edema Neuro non-focal Skin warm and dry No muscular weakness   Labs:   Lab Results  Component Value Date   WBC 9.0 09/04/2015   HGB 9.4* 09/04/2015   HCT 30.2* 09/04/2015   MCV 83.2 09/04/2015   PLT 367 09/04/2015    Recent Labs Lab 09/04/15 0218  NA  139  K 5.0  CL 109  CO2 22  BUN 30*  CREATININE 1.52*  CALCIUM 9.1  PROT 5.7*  BILITOT 0.5  ALKPHOS 222*  ALT 60*  AST 45*  GLUCOSE 144*   Lab Results  Component Value Date   CKTOTAL 36* 08/29/2015   CKMB 2.1 08/29/2015   TROPONINI <0.03 08/29/2015    Lab Results  Component Value Date   CHOL 131 06/30/2015   CHOL 158 12/27/2014   CHOL 179 07/22/2014   Lab Results  Component Value Date   HDL 26.90* 06/30/2015   HDL 28.60* 12/27/2014   HDL 35.30* 07/22/2014   Lab Results  Component Value Date   LDLCALC 49 09/13/2013   LDLCALC 69 08/20/2013   LDLCALC  01/12/2010    39        Total Cholesterol/HDL:CHD Risk Coronary Heart Disease Risk Table                     Men   Women  1/2 Average Risk   3.4   3.3  Average Risk       5.0   4.4  2 X Average Risk   9.6   7.1  3 X Average Risk  23.4   11.0        Use the calculated Patient Ratio above and the CHD Risk Table to determine the patient's CHD Risk.        ATP III CLASSIFICATION (LDL):  <100     mg/dL   Optimal  100-129  mg/dL   Near or Above                    Optimal  130-159  mg/dL   Borderline  160-189  mg/dL   High  >190     mg/dL   Very High   Lab Results  Component Value Date   TRIG 272.0* 06/30/2015   TRIG 247.0* 12/27/2014   TRIG 328.0* 07/22/2014   Lab Results  Component Value Date   CHOLHDL 5 06/30/2015   CHOLHDL 6 12/27/2014   CHOLHDL 5 07/22/2014   Lab Results  Component Value Date   LDLDIRECT 59.0 06/30/2015   LDLDIRECT 83.0 12/27/2014  LDLDIRECT 91.0 07/22/2014      Radiology: Ct Chest Wo Contrast  08/29/2015  CLINICAL DATA:  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 Further evaluation requested. Initial encounter. EXAM: CT CHEST WITHOUT CONTRAST TECHNIQUE: Multidetector CT imaging of the chest was performed following the standard protocol without IV contrast. COMPARISON:  Chest radiograph from 04/06/2012, CT of the abdomen and pelvis  from 03/08/2012, and MRI of the thoracic spine performed earlier today at 7:27 p.m. 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 This progresses to the level of the proximal abdominal aorta, where the aorta measures 3.8 cm at the diaphragm. It is normal in caliber starting at the level of the superior mesenteric artery. The ascending thoracic aorta and aortic arch are grossly unremarkable, aside from scattered calcification along the thoracic abdominal aorta. Calcification is noted at the origin of the superior mesenteric artery. Diffuse coronary artery calcifications are seen. Mediastinum: The mediastinum is otherwise unremarkable in appearance. No mediastinal lymphadenopathy is seen. No pericardial effusion is identified. Scattered calcification is noted along the proximal great vessels. The patient is status post median sternotomy. The visualized portions of thyroid gland are grossly unremarkable. No axillary lymphadenopathy is seen. Lungs/Pleura: Prominent high density at the left lung base reflects prior talc pleurodesis, with left-sided extrapleural fat again noted. Mild associated scarring is seen. Trace right-sided pleural fluid is noted. A 8 mm nodule is noted at the right lower lobe (image 96 of 164), while a 5 mm pleural-based nodule is noted at the right upper lobe (image 40 of 164). The right lung is otherwise clear. Upper Abdomen: The visualized portions of the liver and spleen are grossly unremarkable. Stones are noted dependently within the gallbladder. Postoperative change is noted. There is severe chronic left-sided hydronephrosis, with severe chronic left renal atrophy. Minimal right-sided renal atrophy is suggested, new from the prior study. Musculoskeletal: There is chronic compression deformity involving vertebral body T12.  Degenerative change is noted at the lower cervical spine, with chronic osseous fusion noted at C7-T1. IMPRESSION: 1. 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 The aorta measures 3.8 cm at the diaphragm. It is normal in caliber beginning at the level of the superior mesenteric artery. Recommend annual imaging followup by MRA or CTA, as deemed clinically appropriate. 2. Diffuse coronary artery calcifications seen. 3. Scattered calcification along the thoracic and proximal abdominal aorta, with calcification at the origin of the superior mesenteric artery. 4. Prominent high density at the left lung base reflects prior talc pleurodesis, with mild associated scarring. 5. Trace right-sided pleural fluid noted. 6. Two right-sided pulmonary nodules noted, measuring 8 mm and 5 mm. Non-contrast chest CT at 3-6 months is recommended. If the nodules are stable at time of repeat CT, then future CT at 18-24 months (from today's scan) is considered optional for low-risk patients, but is recommended for high-risk patients. This recommendation follows the consensus statement: Guidelines for Management of Incidental Pulmonary Nodules Detected on CT Images:From the Fleischner Society 2017; published online before print (10.1148/radiol.IJ:2314499). 7. Cholelithiasis.  Gallbladder otherwise unremarkable. 8. Severe chronic left-sided hydronephrosis, with severe chronic left renal atrophy. Minimal right renal atrophy suggested, new from the prior study. 9. Chronic compression deformity involving vertebral body T12. Electronically  Signed   By: Garald Balding M.D.   On: 08/29/2015 00:46   Mr Brain Wo Contrast  08/29/2015  CLINICAL DATA:  Initial evaluation for acute weakness. EXAM: MRI HEAD WITHOUT CONTRAST TECHNIQUE: Multiplanar, multiecho pulse sequences of the brain and surrounding structures were obtained without intravenous  contrast. COMPARISON:  Prior CT from 06/07/2012 as well as arteriogram from 12/02/2004. FINDINGS: Mild diffuse prominence of the CSF containing spaces is compatible with generalized age-related cerebral atrophy. Patchy T2/FLAIR hyperintensity within the periventricular and deep white matter both cerebral hemispheres most consistent with chronic small vessel ischemic disease. Few scattered superimposed remote lacunar infarctions present. Probable small remote lacunar infarct within the left thalamus. Small remote infarcts within the left cerebellar hemisphere. Encephalomalacia within the left parietal and occipital lobes also consistent with remote infarcts. No abnormal foci of restricted diffusion to suggest acute infarct. Gray-white matter differentiation maintained. There is an abnormal flow void within the right internal carotid artery, which may related to slow flow and/ or occlusion. 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 Abnormal flow void within the left vertebral artery is well, which may be related to slow flow and/or occlusion. Few scattered punctate foci susceptibility artifact present within the supratentorial and infratentorial brain, likely small chronic micro hemorrhages, most likely related a chronic underlying hypertension. No mass lesion, midline shift, or mass effect. No hydrocephalus. No extra-axial fluid collection. Major dural sinuses are patent. Craniocervical junction within normal limits. Visualized upper cervical spine without acute abnormality para Pituitary gland within normal limits. No acute abnormality about the globes and orbits. Patient is status post bilateral lens extraction. Paranasal sinuses are clear. Trace opacity within left mastoid air cells. Inner ear structures normal. Bone marrow signal intensity within normal limits. No scalp soft tissue abnormality. IMPRESSION: 1. No acute  intracranial infarct or other process identified. 2. 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 Additional scattered remote small vessel lacunar infarcts. 3. Abnormal flow voids within the right ICA and distal left vertebral artery, which may be related to slow flow and/or occlusion. 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 Patchy slow flow was also present within the left vertebral artery at that time as well. 4. Age-related cerebral atrophy with moderate chronic small vessel ischemic disease. Electronically Signed   By: Jeannine Boga M.D.   On: 08/29/2015 01:31   Mr Thoracic Spine Wo Contrast  08/28/2015  CLINICAL DATA:  75 year old female with 3 weeks of bilateral lower extremity weakness. Ataxia. Unintentional weight loss for 2 months. Anemia. Initial encounter. EXAM: MRI THORACIC SPINE WITHOUT CONTRAST TECHNIQUE: Multiplanar, multisequence MR imaging of the thoracic spine was performed. No intravenous contrast was administered. COMPARISON:  Chest radiographs 04/06/2013. Lumbar MRI from today reported separately. CT Abdomen and Pelvis 03/08/2012. FINDINGS: Limited sagittal imaging of the cervical spine suggests widespread advanced cervical spine disc and endplate degeneration (series 3, image 7). There is evidence of associated spinal stenosis, perhaps maximal at C3-C4. There is severe disc space loss at C7-T1 associated with mild spondylolisthesis and facet hypertrophy. No significant spinal stenosis results at this level. There is moderate to severe left C8 foraminal stenosis. There is a chronic mild to moderate T12 compression fracture. Elsewhere thoracic vertebral height and alignment is preserved. No marrow edema or evidence of acute osseous  abnormality. The thoracic spinal canal is capacious. There are occasional small thoracic  disc protrusions. At T11-T12 there is disc osteophyte complex - in part related to the mild T12 posterior superior endplate retropulsion - in addition to mild posterior element hypertrophy, but no significant thoracic spinal stenosis results. Spinal cord signal is within normal limits at all visualized levels. The conus medullaris occurs below the L1 level, see lumbar comparison from today. There is abnormal increased signal intensity in the left lower lobe (series 8, image 32), a within nonspecific appearance. There is aneurysmal enlargement of the descending thoracic aorta at the T9-T10 level measuring 4.3 cm with mural plaque or thrombus (series 8, image 30). There is also a small right lung pulmonary nodule on image 26. Negative visualized upper abdominal viscera. IMPRESSION: 1. No thoracic spinal stenosis, despite a chronic T12 compression fracture. Very mild for age thoracic spine degenerative changes. 2. Advanced cervical spine degeneration suspected with probable cervical spinal stenosis. Cervical spine MRI would evaluate further. 3. 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 The descending thoracic aortic aneurysm diameters estimated at 4.3 cm on this exam Electronically Signed   By: Genevie Ann M.D.   On: 08/28/2015 20:23   Mr Lumbar Spine Wo Contrast  08/28/2015  CLINICAL DATA:  75 year old female with 3 weeks of bilateral lower extremity weakness. Ataxia. Unintentional weight loss for 2 months. Anemia. Initial encounter. EXAM: MRI LUMBAR SPINE WITHOUT CONTRAST TECHNIQUE: Multiplanar, multisequence MR imaging of the lumbar spine was performed. No intravenous contrast was administered. COMPARISON:  Thoracic spine MRI from today reported separately. [facility] retrograde  pyelogram 08/01/2015. Renal ultrasound 01/09/2015. CT Abdomen and Pelvis 03/08/2012 FINDINGS: Segmentation:  Normal Alignment: Chronic T12 compression fracture unchanged since 2014. Lumbar vertebral height and alignment is stable. There is mild retrolisthesis at L2-L3. Vertebrae: No marrow edema or evidence of acute osseous abnormality. Conus medullaris: Extends to the L1-L2 and appears normal. No lower thoracic spinal cord signal abnormality. Paraspinal and other soft tissues: Cholelithiasis. Furthermore, the CBD is dilated up to 16 mm diameter and there are low signal filling defects in the distal common bile duct as seen on series 6, image 10. These measure up to 10 mm diameter. The visible gallbladder does not appear inflamed. Chronic severe left renal atrophy, left hydronephrosis, and left hydroureter. Fluid fluid levels in the left renal collecting system. There is evidence of a right ureteral stent. There is mild right and hydroureter which is new since 2014. Diverticulosis of the sigmoid colon is partially visible in the pelvis. Disc levels: Mild lumbar spine degeneration above the L4 vertebral level. No significant lumbar spinal stenosis at those levels. Also, there are no significant degenerative changes at L5-S1. L4-L5: Circumferential disc bulge with superimposed right paracentral broad-based disc protrusion. Superimposed moderate facet and ligament flavum hypertrophy with a blunted and facet joint fluid. Moderate spinal stenosis (series 6, image 26). Mild right greater than left L4 foraminal stenosis. IMPRESSION: 1. Cholelithiasis AND Choledocholithiasis with dilated CBD up to 16 mm. See series 6, images 4 through 13. Recommend Gastroenterology consultation. 2. Fairly mild for age lumbar spine degeneration. Isolated moderate lumbar spinal stenosis at L4-L5 related to disc and posterior element degeneration. No acute osseous abnormality. 3. 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  There appears to be a right side  ureteral stent in place. 4. See also thoracic spine MRI findings from today reported separately. Electronically Signed   By: Genevie Ann M.D.   On: 08/28/2015 20:39   US Abdomen Complete  08/29/2015  CLINICAL DATA:  75 year old female with abnormal liver function. EXAM: ABDOMEN ULTRASOUND COMPLETE COMPARISON:  Renal ultrasound dated 01/09/2015 and lumbar spine MRI dated 08/28/2015 FINDINGS: Gallbladder: There are multiple stones within the gallbladder. There is no gallbladder wall thickening or pericholecystic fluid. Negative sonographic Murphy's sign. Common bile duct: Diameter: Dilated measuring up to 12 mm in the midportion. Stones noted in the central CBD on the lumbar MRI. Liver: No focal lesion identified. Within normal limits in parenchymal echogenicity. IVC: No abnormality visualized. Pancreas: Visualized portion unremarkable. Spleen: Size and appearance within normal limits. Right Kidney: Length: 9.6 cm. There is a 1.4 x 1 3 x 1.2 cm exophytic cyst from the anterior cortex of the right renal interpolar region. A right ureteral stent is partially visualized. There is mild right hydronephrosis. Left Kidney: Length: 11.9 cm. There is severe left hydronephrosis with left renal parenchymal atrophy and cortical thinning. There is diffuse increased left renal parenchymal echogenicity. Abdominal aorta: The aorta measures 3.3 cm in diameter. The distal aorta is not visualized. Other findings: There is layering debris within the urinary bladder. Correlation with urinalysis recommended to exclude UTI. IMPRESSION: Cholelithiasis without sonographic evidence of acute cholecystitis. Mild right hydronephrosis. A ureteral stent is partially visualized in the right renal pelvis. Severe left hydronephrosis with parenchymal atrophy and cortical thinning of the left kidney. 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 CT may provide better evaluation of the aorta. Electronically Signed   By: Anner Crete M.D.   On: 08/29/2015 05:25   Dg Chest Port 1 View  09/03/2015  CLINICAL DATA:  Shortness of breath EXAM: PORTABLE CHEST 1 VIEW COMPARISON:  CT 08/28/2015 FINDINGS: Prior CABG. Left basilar scarring. Heart is normal size. No acute confluent airspace opacities or effusions. No acute bony abnormality. IMPRESSION: No active disease. Electronically Signed   By: Rolm Baptise M.D.   On: 09/03/2015 15:34   Dg Ercp  09/03/2015  CLINICAL DATA:  75 year old female with cholelithiasis and choledocholithiasis EXAM: ERCP TECHNIQUE: Multiple spot images obtained with the fluoroscopic device and submitted for interpretation post-procedure. FLUOROSCOPY TIME:  If the device does not provide the exposure index: Fluoroscopy Time: 2 minutes reported. Please see GI note for further detail. Number of Acquired Images:  0 COMPARISON:  Right upper quadrant ultrasound 08/29/2015 FINDINGS: A total of 7 intraoperative spot images demonstrate a flexible endoscope in the descending duodenum followed by cannulation of the common bile duct and cholangiogram. Multiple faceted filling defects are present within the knee markedly dilated common duct concerning for choledocholithiasis. Subsequent images demonstrate sphincterotomy and balloon sweep of the common bile duct. Incidentally, there is a right-sided double-J ureteral stent which is partially imaged. IMPRESSION: 1. Choledocholithiasis with moderate intra and extrahepatic biliary ductal dilatation. 2. ERCP with sphincterotomy and balloon sweep of the bile duct. These images were submitted for radiologic interpretation only. Please see the procedural report for the amount of contrast and the fluoroscopy time utilized. Electronically Signed   By: Jacqulynn Cadet M.D.   On: 09/03/2015 14:50    EKG: SR LVH non specific ST changes   ASSESSMENT AND PLAN:  CAD/CABG distant 1007 with non ischemic myovue 2013 active with no angina able to do 5 METS for non high risk laparoscopic  surgery clear to proceed Vascular:  Post bilateral CEA  with residual bruits  07/04/15 right ICA chronically occluded left stable 60-79% ICA PVD:  Moderate claudication worse in RLE f/u Dr Gwenlyn Found  GB:  Abdominal exam benign now currently not on antibiotics at this time  Discussed situation with patient and daughter and they are comfortable to proceed tomorrow with surgery   Signed: Jenkins Rouge 09/04/2015, 3:40 PM

## 2015-09-04 NOTE — Progress Notes (Signed)
Daily Rounding Note  09/04/2015, 9:52 AM  LOS: 7 days   SUBJECTIVE:       No abd pain or n/v.  No complaints  OBJECTIVE:         Vital signs in last 24 hours:    Temp:  [97.3 F (36.3 C)-97.7 F (36.5 C)] 97.3 F (36.3 C) (06/29 [id number]) Pulse Rate:  [65-80] 72 (06/29 0613) Resp:  [17-28] 18 (06/29 0613) BP: (93-202)/(55-118) 134/61 mmHg (06/29 0613) SpO2:  [98 %-100 %] 100 % (06/29 [id number]) Weight:  [58.559 kg (129 lb 1.6 oz)] 58.559 kg (129 lb 1.6 oz) (06/29 0613) Last BM Date: 09/03/15 Filed Weights   09/02/15 0427 09/03/15 0410 09/04/15 [id number]  Weight: 58.695 kg (129 lb 6.4 oz) 59.058 kg (130 lb 3.2 oz) 58.559 kg (129 lb 1.6 oz)   General: frail but alert and not toxic or acutely ill.  comfortable   Heart: RRR Chest: clear bil.   Abdomen: soft, NT, ND  Extremities: noo CCE Neuro/Psych:  Oriented x 3.    Intake/Output from previous day: 06/28 0701 - 06/29 0700 In: 600 [I.V.:600] Out: 5 [Blood:5]  Intake/Output this shift: Total I/O In: -  Out: 101 [Urine:100; Stool:1]  Lab Results:  Recent Labs  09/02/15 0723 09/04/15 0218  WBC 9.4 9.0  HGB 9.5* 9.4*  HCT 31.5* 30.2*  PLT 348 367   BMET  Recent Labs  09/02/15 0723 09/04/15 0218  NA 141 139  K 4.3 5.0  CL 107 109  CO2 24 22  GLUCOSE 163* 144*  BUN 27* 30*  CREATININE 1.66* 1.52*  CALCIUM 9.3 9.1   LFT  Recent Labs  09/04/15 0218  PROT 5.7*  ALBUMIN 2.0*  AST 45*  ALT 60*  ALKPHOS 222*  BILITOT 0.5   PT/INR No results for input(s): LABPROT, INR in the last 72 hours. Hepatitis Panel No results for input(s): HEPBSAG, HCVAB, HEPAIGM, HEPBIGM in the last 72 hours.  Studies/Results: Dg Chest Port 1 View  09/03/2015  CLINICAL DATA:  Shortness of breath EXAM: PORTABLE CHEST 1 VIEW COMPARISON:  CT 08/28/2015 FINDINGS: Prior CABG. Left basilar scarring. Heart is normal size. No acute confluent airspace opacities or effusions. No acute  bony abnormality. IMPRESSION: No active disease. Electronically Signed   By: Rolm Baptise M.D.   On: 09/03/2015 15:34   Dg Ercp  09/03/2015  CLINICAL DATA:  75 year old female with cholelithiasis and choledocholithiasis EXAM: ERCP TECHNIQUE: Multiple spot images obtained with the fluoroscopic device and submitted for interpretation post-procedure. FLUOROSCOPY TIME:  If the device does not provide the exposure index: Fluoroscopy Time: 2 minutes reported. Please see GI note for further detail. Number of Acquired Images:  0 COMPARISON:  Right upper quadrant ultrasound 08/29/2015 FINDINGS: A total of 7 intraoperative spot images demonstrate a flexible endoscope in the descending duodenum followed by cannulation of the common bile duct and cholangiogram. Multiple faceted filling defects are present within the knee markedly dilated common duct concerning for choledocholithiasis. Subsequent images demonstrate sphincterotomy and balloon sweep of the common bile duct. Incidentally, there is a right-sided double-J ureteral stent which is partially imaged. IMPRESSION: 1. Choledocholithiasis with moderate intra and extrahepatic biliary ductal dilatation. 2. ERCP with sphincterotomy and balloon sweep of the bile duct. These images were submitted for radiologic interpretation only. Please see the procedural report for the amount of contrast and the fluoroscopy time utilized. Electronically Signed   By: Jacqulynn Cadet M.D.   On: 09/03/2015 14:50  ASSESMENT:   *  Choledocholithiasis.  ERCP with sphincterotomy, stone extraction 6/28  *  Chronic Plavix.  Held for last several days to allow for ERCP.  Dr Henrene Pastor wants to restart this on 7/5.    *   Stable anemia, normocytic.  FOBT negative. Colon for IDA (subop prep, diverticulosis. ) and EGD (duodenitis) ( Hung/Mann 2011).  EGD yesterday: esophageal ring, no gastritis or duodenitis.    PLAN   *  called general surgery input re: cholecystectomy.  Right now, with  Plavix on hold, would be ideal time to pursue cholecystectomy.   *  Advance diet to regular.    *  Does she need a colonoscopy.  GI office follow up in Sep 2017.    *  Resume po Iron at discharge.      [name] [name]  09/04/2015, 9:52 AM Pager: [phone]

## 2015-09-04 NOTE — Consult Note (Signed)
[facility] Surgery Consult Note  [name] [name] [birth year]  [id number].    Requesting MD: Dr. Silverio Decamp  Chief Complaint/Reason for Consult: choledocolithiasis s/p ERCP   HPI:  75 year old female with PMH Diverticulosis, CAD, AAA (4.2 cm), CHF w/ preserved EF, DM2, chronic back pain, and stage 4 CKD who presented to ED on 08/28/15 after she was found to have elevated LFT's and lower extremity weakness/inability to walk at Dr. Ronnie Derby office. Lower extremity weakness spontaneously resolved. In ED, pt was diagnosed with anemia(hgb 6.8), hypotension, UTI, elevated LFT's, and acute on chronic renal failure. Choledocholithiasis was noted on MRI scan and the patient was admitted for further workup. Pt was taking plavix outpatient, which was held, and she received an ERCP with sphicterotomy and removal of multiple CBD stones on 09/03/15. Internal Medicine has asked [place] to consult regarding possible cholecystectomy while patient is off of plavix.  Today she states that she has no abdominal pain, tolerating PO, No N/V. States that one week before discharge she had 3 episodes of emesis after meals. Denies abdominal pain associated with meals. Does report weight loss, 50 lbs, due to decreased appetite. Reports mostly back pain, which is chronic. Denies HA, vision changes, CP, SOB, and weakness. No blood thinning medications outside of Plavix. Past abdominal surgeries include c-section, appendectomy, and total hysterectomy. 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 Smith Robert. Pt ambulated on her own and lives alone at baseline.  Requests that Dr. Marlou Starks speak with her daughter regarding lap chole.  AST/ALT: 45/60 Alk Phos: 222 T bili: 0.5  ROS: All systems reviewed and otherwise negative except for as above  Family History  Problem Relation Age of Onset  . Congestive Heart Failure Mother   . Diabetes Mother   . Stroke Father   . Cancer Maternal Aunt     Breast cancer     Past Medical History  Diagnosis Date  . CHF (congestive heart failure) ([place])   . Hypertension   . Hyperlipidemia   . Hypothyroidism   . Ischemic cardiomyopathy     03-25-2010-- per lasts echo EF  50-55%  . Coronary artery disease due to lipid rich plaque cardiologist-  dr berry    s/p CABG x6 1997-- cath 12-09-2009 occluded vein to obtuse marginal branch and ramus branch with patent vien to PDA and patent LIMA to LAD, ef 40%-- Myoview 11-24-2011, nonischemic  . Type 2 diabetes mellitus ([place])     monitored by dr Dwyane Dee  . Retroperitoneal fibrosis   . Bilateral hydronephrosis   . CKD (chronic kidney disease), stage III   . History of sepsis     10-18-2014 w/ acute pyelonephritis  . Dyspnea on exertion   . GERD (gastroesophageal reflux disease)   . PVD (peripheral vascular disease) with claudication ([place])     last duplex 07-04-2015 -- Right CCA and ICA chronic occlusion, 24-09% LICA, Patent vertebral arteries w/ antegrade flow, bilateral normal subclavian arteries   . Carotid artery stenosis     carotid doppler 06/2012 - Right CCA/Bulb/ICA with chronic occlusion; L vertebral artery with abnormal blood flow; L Bulb/Prox ICA  s/p endarterectomy with mild fibrous plaque, 50% diameter reduction  . PAD (peripheral artery disease) ([place])     09/2010 LEAs - R ABI of 0.45, occluded fem-pop bypass graft, L ABI of 0.59 with occluded SFA; severe arterial insuff    Past Surgical History  Procedure Laterality Date  . Cystoscopy w/ ureteral stent placement  03/10/2012  Procedure: CYSTOSCOPY WITH RETROGRADE PYELOGRAM/URETERAL STENT PLACEMENT;  Surgeon: Hanley Ben, MD;  Location: [facility];  Service: Urology;  Laterality: Left;  . Carotid endarterectomy Bilateral right 1994//  left ?  Marland Kitchen Transthoracic echocardiogram  03/25/2010    EF 00-37%, LV systolic function low normal with mild inferoseptal hypocontractility; LA mildly dilated; mod MR; mild TR, RV systolic pressure elevated, mild pulm HTN; AV  mildly sclerotic; mild pulm valve regurg; aortic root sclerosis/calcif   . Coronary artery bypass graft  1997    x6; internal mammary to LAD, SVG to ramus #1 & #2, SVG to OM, SVG to PDA,   . Cardiac catheterization  12-09-2009  dr al little    EF >40%-- occluded vein to OM & ramus branches, patent vein to PDA, patent LIMA to LAD (Dr. [name], [facility]) - later had thrombectomy of R fem-pop bypass ad R common femoral & profunda femoris artery (Dr. Oneida Alar)  . Repair right femoral pseudoaneuysm/  right fem-pop bypass graft/  debridement right lower extremitiy venous status ulcers x2  01-12-2005  . Aorta - bilateral femoral artery bypass graft  1997    and RIGHT FEM-POP   . Cardiovascular stress test  11-24-2011   dr berry    Low Risk study: fixed basal to mid inferior attenuation artifact, no reversible ischemia,  normal LV function and wall motion , ef 67%  . Total abdominal hysterectomy w/ bilateral salpingoophorectomy  1986  . Cataract extraction w/ intraocular lens  implant, bilateral  2006  . Cystoscopy w/ ureteral stent placement Bilateral 03/07/2015    Procedure: BILATERAL RETROGRADE PYELOGRAM AND RIGHT URETERAL STENT PLACEMENT;  Surgeon: Ardis Hughs, MD;  Location: [facility];  Service: Urology;  Laterality: Bilateral;  . Cystoscopy w/ ureteral stent placement Right 08/01/2015    Procedure: CYSTOSCOPY WITH STENT REPLACEMENT;  Surgeon: Ardis Hughs, MD;  Location: [facility];  Service: Urology;  Laterality: Right;  . Cystoscopy w/ retrogrades Right 08/01/2015    Procedure: CYSTOSCOPY WITH RETROGRADE PYELOGRAM;  Surgeon: Ardis Hughs, MD;  Location: [facility];  Service: Urology;  Laterality: Right;  . Ercp N/A 09/03/2015    Procedure: ENDOSCOPIC RETROGRADE CHOLANGIOPANCREATOGRAPHY (ERCP);  Surgeon: Irene Shipper, MD;  Location[facility];  Service: Endoscopy;  Laterality: N/A;    Social History:  reports that she quit smoking  about 6 years ago. She has never used smokeless tobacco. She reports that she drinks about 1.8 oz of alcohol per week. She reports that she does not use illicit drugs.  Allergies: No Known Allergies  Medications Prior to Admission  Medication Sig Dispense Refill  . albuterol (PROVENTIL HFA;VENTOLIN HFA) 108 (90 BASE) MCG/ACT inhaler Inhale 2 puffs into the lungs every 6 (six) hours as needed for wheezing. 1 Inhaler 5  . aspirin 81 MG chewable tablet Chew 81 mg by mouth at bedtime.     . calcium-vitamin D (OSCAL WITH D) 500-200 MG-UNIT per tablet Take 1 tablet by mouth every morning.    . clopidogrel (PLAVIX) 75 MG tablet TAKE 1 TABLET EVERY MORNING 90 tablet 1  . Cranberry 250 MG TABS Take 1 tablet by mouth daily.     . CRESTOR 20 MG tablet take 1 tablet by mouth once daily 90 tablet 1  . ferrous sulfate 325 (65 FE) MG tablet Take 325 mg by mouth 3 (three) times a week.     Marland Kitchen HYDROcodone-acetaminophen (NORCO/VICODIN) 5-325 MG tablet Take 1 tablet every 6 hours as needed for  back pain 60 tablet 0  . insulin aspart (NOVOLOG) 100 UNIT/ML FlexPen Inject 8 units three times a day with meals 15 mL 5  . Insulin Detemir (LEVEMIR FLEXTOUCH) 100 UNIT/ML Pen Inject 22 units at bedtime. (Patient taking differently: Inject 18 Units into the skin at bedtime. Inject 20 units at bedtime.) 30 mL 1  . lactobacillus acidophilus (BACID) TABS tablet Take 2 tablets by mouth 3 (three) times daily.    Marland Kitchen latanoprost (XALATAN) 0.005 % ophthalmic solution Place 1 drop into both eyes at bedtime.  0  . levothyroxine (SYNTHROID, LEVOTHROID) 125 MCG tablet TAKE 1 TABLET DAILY (Patient taking differently: TAKE 1 TABLET DAILY---  takes in am takes only 1/2 tablet on Saturday) 90 tablet 3  . metoprolol succinate (TOPROL-XL) 25 MG 24 hr tablet TAKE 1/2 TABLET (=12.5MG)  EVERY MORNING 45 tablet 3  . omeprazole (PRILOSEC) 20 MG capsule TAKE 1 CAPSULE DAILY (Patient taking differently: TAKE 1 CAPSULE DAILY--  takes in am) 90 capsule  3  . vitamin C (ASCORBIC ACID) 500 MG tablet Take 500 mg by mouth every morning.    . Vitamin D, Ergocalciferol, (DRISDOL) 50000 units CAPS capsule TAKE 1 CAPSULE BY MOUTH EVERY 7 DAYS ON THURSDAYS 4 capsule 5  . furosemide (LASIX) 20 MG tablet take 1 tablet by mouth once daily (Patient taking differently: take 1 tablet by mouth once prn) 30 tablet 5  . metoCLOPramide (REGLAN) 5 MG tablet Take 1 tablet (5 mg total) by mouth 4 (four) times daily. 60 tablet 2  . phenazopyridine (PYRIDIUM) 200 MG tablet Take 1 tablet (200 mg total) by mouth 3 (three) times daily as needed for pain. 10 tablet 0    Blood pressure 134/61, pulse 72, temperature 97.3 F (36.3 C), temperature source Oral, resp. rate 18, height '5\' 6"'  (1.676 m), weight 58.559 kg (129 lb 1.6 oz), SpO2 100 %. Physical Exam: General: pleasant,  white female who is laying in bed in NAD  HEENT: head is normocephalic, atraumatic.  Sclera are noninjected. Mouth is pink and moist Heart: regular, rate, and rhythm.  No obvious murmurs, gallops, or rubs noted.  Palpable pedal pulses bilaterally Lungs: CTAB, no wheezes, rhonchi, or rales noted.  Respiratory effort nonlabored Abd: soft, NT/ND, +BS, spigelian hernia of LUQ- reducible  MS: all 4 extremities are symmetrical with no cyanosis, clubbing, or edema. Skin: warm and dry with no masses, lesions, or rashes Psych: A&Ox3 with an appropriate affect.  Results for orders placed or performed during the hospital encounter of 08/28/15 (from the past 48 hour(s))  Glucose, capillary     Status: Abnormal   Collection Time: 09/02/15 11:20 AM  Result Value Ref Range   Glucose-Capillary 163 (H) 65 - 99 mg/dL   Comment 1 Notify RN   Glucose, capillary     Status: Abnormal   Collection Time: 09/02/15  4:38 PM  Result Value Ref Range   Glucose-Capillary 143 (H) 65 - 99 mg/dL   Comment 1 Notify RN    Comment 2 Document in Chart   Glucose, capillary     Status: Abnormal   Collection Time: 09/02/15  9:03  PM  Result Value Ref Range   Glucose-Capillary 211 (H) 65 - 99 mg/dL   Comment 1 Notify RN    Comment 2 Document in Chart   Glucose, capillary     Status: Abnormal   Collection Time: 09/03/15  6:17 AM  Result Value Ref Range   Glucose-Capillary 178 (H) 65 - 99 mg/dL  Glucose,  capillary     Status: Abnormal   Collection Time: 09/03/15 11:36 AM  Result Value Ref Range   Glucose-Capillary 168 (H) 65 - 99 mg/dL   Comment 1 Notify RN    Comment 2 Document in Chart   Glucose, capillary     Status: Abnormal   Collection Time: 09/03/15  4:09 PM  Result Value Ref Range   Glucose-Capillary 230 (H) 65 - 99 mg/dL   Comment 1 Notify RN    Comment 2 Document in Chart   Glucose, capillary     Status: Abnormal   Collection Time: 09/03/15  9:04 PM  Result Value Ref Range   Glucose-Capillary 152 (H) 65 - 99 mg/dL  CBC     Status: Abnormal   Collection Time: 09/04/15  2:18 AM  Result Value Ref Range   WBC 9.0 4.0 - 10.5 K/uL   RBC 3.63 (L) 3.87 - 5.11 MIL/uL   Hemoglobin 9.4 (L) 12.0 - 15.0 g/dL   HCT 30.2 (L) 36.0 - 46.0 %   MCV 83.2 78.0 - 100.0 fL   MCH 25.9 (L) 26.0 - 34.0 pg   MCHC 31.1 30.0 - 36.0 g/dL   RDW 17.0 (H) 11.5 - 15.5 %   Platelets 367 150 - 400 K/uL  Comprehensive metabolic panel     Status: Abnormal   Collection Time: 09/04/15  2:18 AM  Result Value Ref Range   Sodium 139 135 - 145 mmol/L   Potassium 5.0 3.5 - 5.1 mmol/L   Chloride 109 101 - 111 mmol/L   CO2 22 22 - 32 mmol/L   Glucose, Bld 144 (H) 65 - 99 mg/dL   BUN 30 (H) 6 - 20 mg/dL   Creatinine, Ser 1.52 (H) 0.44 - 1.00 mg/dL   Calcium 9.1 8.9 - 10.3 mg/dL   Total Protein 5.7 (L) 6.5 - 8.1 g/dL   Albumin 2.0 (L) 3.5 - 5.0 g/dL   AST 45 (H) 15 - 41 U/L   ALT 60 (H) 14 - 54 U/L   Alkaline Phosphatase 222 (H) 38 - 126 U/L   Total Bilirubin 0.5 0.3 - 1.2 mg/dL   GFR calc non Af Amer 33 (L) >60 mL/min   GFR calc Af Amer 38 (L) >60 mL/min    Comment: (NOTE) The eGFR has been calculated using the CKD EPI  equation. This calculation has not been validated in all clinical situations. eGFR's persistently <60 mL/min signify possible Chronic Kidney Disease.    Anion gap 8 5 - 15  Glucose, capillary     Status: Abnormal   Collection Time: 09/04/15  6:17 AM  Result Value Ref Range   Glucose-Capillary 153 (H) 65 - 99 mg/dL   Dg Chest Port 1 View  09/03/2015  CLINICAL DATA:  Shortness of breath EXAM: PORTABLE CHEST 1 VIEW COMPARISON:  CT 08/28/2015 FINDINGS: Prior CABG. Left basilar scarring. Heart is normal size. No acute confluent airspace opacities or effusions. No acute bony abnormality. IMPRESSION: No active disease. Electronically Signed   By: Rolm Baptise M.D.   On: 09/03/2015 15:34   Dg Ercp  09/03/2015  CLINICAL DATA:  75 year old female with cholelithiasis and choledocholithiasis EXAM: ERCP TECHNIQUE: Multiple spot images obtained with the fluoroscopic device and submitted for interpretation post-procedure. FLUOROSCOPY TIME:  If the device does not provide the exposure index: Fluoroscopy Time: 2 minutes reported. Please see GI note for further detail. Number of Acquired Images:  0 COMPARISON:  Right upper quadrant ultrasound 08/29/2015 FINDINGS: A total of 7  intraoperative spot images demonstrate a flexible endoscope in the descending duodenum followed by cannulation of the common bile duct and cholangiogram. Multiple faceted filling defects are present within the knee markedly dilated common duct concerning for choledocholithiasis. Subsequent images demonstrate sphincterotomy and balloon sweep of the common bile duct. Incidentally, there is a right-sided double-J ureteral stent which is partially imaged. IMPRESSION: 1. Choledocholithiasis with moderate intra and extrahepatic biliary ductal dilatation. 2. ERCP with sphincterotomy and balloon sweep of the bile duct. These images were submitted for radiologic interpretation only. Please see the procedural report for the amount of contrast and the  fluoroscopy time utilized. Electronically Signed   By: Jacqulynn Cadet M.D.   On: 09/03/2015 14:50   Assessment/Plan choledocolithiasis - s/p ERCP with sphicterotomy, stone extraction 7/49; no complications; esophageal ring noted. - on chronic plavix, currently held due to recent ERCP   Anemia, normocytic - stable UTI - ancef DM2 CKD4 AAA CAD Hypothyroidism Dyslipidemia Protein-calorie malnutrition  FEN: NPO after midnight, IVF DVT Proph: SCD's ID: Ancef 6/23-6/26 for UTI, UNASYN 6/28 EGD Dispo: may need cardiac clearance, likely laparoscopic cholecystectomy tomorrow. Will Discuss with Dr. Marlou Starks.   Jill Alexanders, [facility] Surgery 09/04/2015, 10:06 AM Pager: [phone] Mon-Fri 7:00 am-4:30 pm Sat-Sun 7:00 am-11:30 am

## 2015-09-04 NOTE — Progress Notes (Signed)
Physical Therapy Treatment Patient Details Name: [name] [name] MRN: [id number] DOB: Aug 22, 1940 Today's Date: Sep 20, 2015    History of Present Illness Pt adm with hypotension, weakness, and Cholelithiasis. PMH - PVD, AAA, CKD, CAD, DM    PT Comments    Pt doing well with mobility. At current level pt should be able to return home. Will need to reassess after surgery.  Follow Up Recommendations  No PT follow up (may change after surgery)     Equipment Recommendations  None recommended by PT    Recommendations for Other Services       Precautions / Restrictions Precautions Precautions: Fall Restrictions Weight Bearing Restrictions: No    Mobility  Bed Mobility Overal bed mobility: Modified Independent                Transfers Overall transfer level: Needs assistance Equipment used: 4-wheeled walker Transfers: Sit to/from Stand Sit to Stand: Supervision            Ambulation/Gait Ambulation/Gait assistance: Supervision Ambulation Distance (Feet): 275 Feet Assistive device: 4-wheeled walker Gait Pattern/deviations: Step-through pattern;Decreased stride length   Gait velocity interpretation: at or above normal speed for age/gender General Gait Details: Steady gait with rollator   Stairs            Wheelchair Mobility    Modified Rankin (Stroke Patients Only)       Balance Overall balance assessment: Needs assistance Sitting-balance support: No upper extremity supported;Feet supported Sitting balance-Leahy Scale: Good     Standing balance support: No upper extremity supported;During functional activity Standing balance-Leahy Scale: Fair                      Cognition Arousal/Alertness: Awake/alert Behavior During Therapy: WFL for tasks assessed/performed Overall Cognitive Status: Within Functional Limits for tasks assessed                      Exercises      General Comments        Pertinent Vitals/Pain Pain  Assessment: 0-10 Pain Score: 5  Pain Location: rt flank/abdomen Pain Descriptors / Indicators: Shooting    Home Living                      Prior Function            PT Goals (current goals can now be found in the care plan section) Acute Rehab PT Goals Patient Stated Goal: return home Progress towards PT goals: Progressing toward goals    Frequency  Min 3X/week    PT Plan Current plan remains appropriate (may change after surgery)    Co-evaluation             End of Session Equipment Utilized During Treatment: Gait belt Activity Tolerance: Patient tolerated treatment well Patient left: with call bell/phone within reach;in bed     Time: 1253-1311 PT Time Calculation (min) (ACUTE ONLY): 18 min  Charges:  $Gait Training: 8-22 mins                    G Codes:      Stina Gane September 20, 2015, 2:18 PM [facility] PT [phone]

## 2015-09-05 ENCOUNTER — Encounter (HOSPITAL_COMMUNITY): Payer: Self-pay | Admitting: Anesthesiology

## 2015-09-05 ENCOUNTER — Encounter (HOSPITAL_COMMUNITY)
Admission: EM | Disposition: A | Discharge status: Home / Self Care | Payer: Self-pay | Source: Home / Self Care | Attending: Internal Medicine

## 2015-09-05 ENCOUNTER — Inpatient Hospital Stay (HOSPITAL_COMMUNITY): Payer: Medicare Other | Admitting: Certified Registered Nurse Anesthetist

## 2015-09-05 DIAGNOSIS — L8990 Pressure ulcer of unspecified site, unspecified stage: Secondary | ICD-10-CM

## 2015-09-05 HISTORY — PX: CHOLECYSTECTOMY: SHX55

## 2015-09-05 LAB — GLUCOSE, CAPILLARY
GLUCOSE-CAPILLARY: 177 mg/dL — AB (ref 65–99)
GLUCOSE-CAPILLARY: 182 mg/dL — AB (ref 65–99)
GLUCOSE-CAPILLARY: 145 mg/dL — AB (ref 65–99)
GLUCOSE-CAPILLARY: 170 mg/dL — AB (ref 65–99)
GLUCOSE-CAPILLARY: 186 mg/dL — AB (ref 65–99)
GLUCOSE-CAPILLARY: 201 mg/dL — AB (ref 65–99)

## 2015-09-05 LAB — BASIC METABOLIC PANEL
ANION GAP: 7 (ref 5–15)
BUN: 27 mg/dL — ABNORMAL HIGH (ref 6–20)
CHLORIDE: 105 mmol/L (ref 101–111)
CO2: 25 mmol/L (ref 22–32)
Calcium: 9.1 mg/dL (ref 8.9–10.3)
Creatinine, Ser: 1.36 mg/dL — ABNORMAL HIGH (ref 0.44–1.00)
GFR calc non Af Amer: 37 mL/min — ABNORMAL LOW (ref >60)
GFR, EST AFRICAN AMERICAN: 43 mL/min — AB (ref >60)
Glucose, Bld: 159 mg/dL — ABNORMAL HIGH (ref 65–99)
POTASSIUM: 5.1 mmol/L (ref 3.5–5.1)
Sodium: 137 mmol/L (ref 135–145)

## 2015-09-05 LAB — CBC
HEMATOCRIT: 31.2 % — AB (ref 36.0–46.0)
HEMOGLOBIN: 9.3 g/dL — AB (ref 12.0–15.0)
MCH: 25.9 pg — ABNORMAL LOW (ref 26.0–34.0)
MCHC: 29.8 g/dL — ABNORMAL LOW (ref 30.0–36.0)
MCV: 86.9 fL (ref 78.0–100.0)
Platelets: 388 10*3/uL (ref 150–400)
RBC: 3.59 MIL/uL — AB (ref 3.87–5.11)
RDW: 16.8 % — ABNORMAL HIGH (ref 11.5–15.5)
WBC: 8.2 10*3/uL (ref 4.0–10.5)

## 2015-09-05 SURGERY — LAPAROSCOPIC CHOLECYSTECTOMY WITH INTRAOPERATIVE CHOLANGIOGRAM
Anesthesia: General | Site: Abdomen

## 2015-09-05 MED ORDER — ROCURONIUM BROMIDE 100 MG/10ML IV SOLN
INTRAVENOUS | Status: DC | PRN
  Administered 2015-09-05: 30 mg via INTRAVENOUS
  Administered 2015-09-05: 20 mg via INTRAVENOUS

## 2015-09-05 MED ORDER — SODIUM CHLORIDE 0.9 % IR SOLN
Status: DC | PRN
  Administered 2015-09-05: 1000 mL

## 2015-09-05 MED ORDER — ALBUMIN HUMAN 5 % IV SOLN
INTRAVENOUS | Status: DC | PRN
  Administered 2015-09-05: 15:00:00 via INTRAVENOUS

## 2015-09-05 MED ORDER — MORPHINE SULFATE (PF) 2 MG/ML IV SOLN
2.0000 mg | INTRAVENOUS | Status: DC | PRN
  Administered 2015-09-05: 4 mg via INTRAVENOUS
  Filled 2015-09-05 (×2): qty 2

## 2015-09-05 MED ORDER — ROCURONIUM BROMIDE 50 MG/5ML IV SOLN
INTRAVENOUS | Status: AC
  Filled 2015-09-05: qty 1

## 2015-09-05 MED ORDER — FENTANYL CITRATE (PF) 250 MCG/5ML IJ SOLN
INTRAMUSCULAR | Status: DC | PRN
  Administered 2015-09-05 (×5): 50 ug via INTRAVENOUS

## 2015-09-05 MED ORDER — HYDROCODONE-ACETAMINOPHEN 5-325 MG PO TABS
ORAL_TABLET | ORAL | Status: AC
  Filled 2015-09-05: qty 2

## 2015-09-05 MED ORDER — SUGAMMADEX SODIUM 200 MG/2ML IV SOLN
INTRAVENOUS | Status: DC | PRN
  Administered 2015-09-05: 200 mg via INTRAVENOUS

## 2015-09-05 MED ORDER — BUPIVACAINE-EPINEPHRINE 0.25% -1:200000 IJ SOLN
INTRAMUSCULAR | Status: DC | PRN
  Administered 2015-09-05: 25 mL

## 2015-09-05 MED ORDER — FENTANYL CITRATE (PF) 100 MCG/2ML IJ SOLN
INTRAMUSCULAR | Status: AC
  Filled 2015-09-05: qty 2

## 2015-09-05 MED ORDER — MIDAZOLAM HCL 2 MG/2ML IJ SOLN
INTRAMUSCULAR | Status: AC
  Filled 2015-09-05: qty 2

## 2015-09-05 MED ORDER — HYDROCODONE-ACETAMINOPHEN 5-325 MG PO TABS
1.0000 | ORAL_TABLET | ORAL | Status: DC | PRN
  Administered 2015-09-05: 2 via ORAL
  Administered 2015-09-05: 1 via ORAL
  Administered 2015-09-06: 2 via ORAL
  Administered 2015-09-06: 1 via ORAL
  Administered 2015-09-06 – 2015-09-07 (×2): 2 via ORAL
  Filled 2015-09-05 (×5): qty 2

## 2015-09-05 MED ORDER — FENTANYL CITRATE (PF) 250 MCG/5ML IJ SOLN
INTRAMUSCULAR | Status: AC
Start: 2015-09-05 — End: 2015-09-05
  Filled 2015-09-05: qty 5

## 2015-09-05 MED ORDER — CEFAZOLIN SODIUM 1 G IJ SOLR
INTRAMUSCULAR | Status: DC | PRN
  Administered 2015-09-05: 2 g via INTRAMUSCULAR

## 2015-09-05 MED ORDER — EPHEDRINE SULFATE 50 MG/ML IJ SOLN
INTRAMUSCULAR | Status: DC | PRN
  Administered 2015-09-05 (×2): 5 mg via INTRAVENOUS

## 2015-09-05 MED ORDER — LACTATED RINGERS IV SOLN
INTRAVENOUS | Status: DC
Start: 2015-09-05 — End: 2015-09-08
  Administered 2015-09-05 – 2015-09-08 (×4): via INTRAVENOUS

## 2015-09-05 MED ORDER — PROPOFOL 10 MG/ML IV BOLUS
INTRAVENOUS | Status: DC | PRN
  Administered 2015-09-05: 30 mg via INTRAVENOUS
  Administered 2015-09-05: 20 mg via INTRAVENOUS

## 2015-09-05 MED ORDER — IOPAMIDOL (ISOVUE-300) INJECTION 61%
INTRAVENOUS | Status: AC
  Filled 2015-09-05: qty 50

## 2015-09-05 MED ORDER — MEPERIDINE HCL 25 MG/ML IJ SOLN: 6.2500 mg | INTRAMUSCULAR | Status: DC | PRN

## 2015-09-05 MED ORDER — PHENYLEPHRINE HCL 10 MG/ML IJ SOLN
10.0000 mg | INTRAVENOUS | Status: DC | PRN
  Administered 2015-09-05: 40 ug/min via INTRAVENOUS

## 2015-09-05 MED ORDER — FENTANYL CITRATE (PF) 100 MCG/2ML IJ SOLN
25.0000 ug | INTRAMUSCULAR | Status: DC | PRN
  Administered 2015-09-05 (×3): 50 ug via INTRAVENOUS

## 2015-09-05 MED ORDER — ONDANSETRON HCL 4 MG/2ML IJ SOLN
INTRAMUSCULAR | Status: AC
  Filled 2015-09-05: qty 2

## 2015-09-05 MED ORDER — BUPIVACAINE-EPINEPHRINE (PF) 0.25% -1:200000 IJ SOLN
INTRAMUSCULAR | Status: AC
  Filled 2015-09-05: qty 30

## 2015-09-05 MED ORDER — LIDOCAINE HCL (CARDIAC) 20 MG/ML IV SOLN
INTRAVENOUS | Status: DC | PRN
  Administered 2015-09-05: 80 mg via INTRAVENOUS

## 2015-09-05 MED ORDER — ONDANSETRON HCL 4 MG/2ML IJ SOLN
INTRAMUSCULAR | Status: DC | PRN
  Administered 2015-09-05 (×2): 4 mg via INTRAVENOUS

## 2015-09-05 MED ORDER — MIDAZOLAM HCL 5 MG/5ML IJ SOLN
INTRAMUSCULAR | Status: DC | PRN
  Administered 2015-09-05: 1 mg via INTRAVENOUS

## 2015-09-05 MED ORDER — DEXAMETHASONE SODIUM PHOSPHATE 4 MG/ML IJ SOLN
INTRAMUSCULAR | Status: DC | PRN
  Administered 2015-09-05: 5 mg via INTRAVENOUS

## 2015-09-05 MED ORDER — 0.9 % SODIUM CHLORIDE (POUR BTL) OPTIME
TOPICAL | Status: DC | PRN
  Administered 2015-09-05: 1000 mL

## 2015-09-05 MED ORDER — PHENYLEPHRINE HCL 10 MG/ML IJ SOLN
INTRAMUSCULAR | Status: DC | PRN
  Administered 2015-09-05 (×3): 80 ug via INTRAVENOUS

## 2015-09-05 MED ORDER — ONDANSETRON HCL 4 MG/2ML IJ SOLN: 4.0000 mg | Freq: Once | INTRAMUSCULAR | Status: DC | PRN

## 2015-09-05 SURGICAL SUPPLY — 53 items
APPLIER CLIP 5 13 M/L LIGAMAX5 (MISCELLANEOUS) ×3
BLADE SURG 10 STRL SS (BLADE) ×3 IMPLANT
BLADE SURG ROTATE 9660 (MISCELLANEOUS) IMPLANT
CANISTER SUCTION 2500CC (MISCELLANEOUS) ×3 IMPLANT
CATH REDDICK CHOLANGI 4FR 50CM (CATHETERS) ×3 IMPLANT
CHLORAPREP W/TINT 26ML (MISCELLANEOUS) ×3 IMPLANT
CLIP APPLIE 5 13 M/L LIGAMAX5 (MISCELLANEOUS) ×1 IMPLANT
COVER MAYO STAND STRL (DRAPES) ×3 IMPLANT
COVER SURGICAL LIGHT HANDLE (MISCELLANEOUS) ×3 IMPLANT
DRAPE C-ARM 42X72 X-RAY (DRAPES) ×3 IMPLANT
ELECT BLADE 4.0 EZ CLEAN MEGAD (MISCELLANEOUS) ×3
ELECT CAUTERY BLADE 6.4 (BLADE) ×3 IMPLANT
ELECT REM PT RETURN 9FT ADLT (ELECTROSURGICAL) ×3
ELECTRODE BLDE 4.0 EZ CLN MEGD (MISCELLANEOUS) ×1 IMPLANT
ELECTRODE REM PT RTRN 9FT ADLT (ELECTROSURGICAL) ×1 IMPLANT
GLOVE BIO SURGEON STRL SZ7.5 (GLOVE) ×6 IMPLANT
GLOVE BIOGEL PI IND STRL 7.5 (GLOVE) ×2 IMPLANT
GLOVE BIOGEL PI INDICATOR 7.5 (GLOVE) ×4
GOWN STRL REUS W/ TWL LRG LVL3 (GOWN DISPOSABLE) ×3 IMPLANT
GOWN STRL REUS W/TWL LRG LVL3 (GOWN DISPOSABLE) ×6
HANDLE SUCTION POOLE (INSTRUMENTS) ×1 IMPLANT
ISOVUE-300 IMPLANT
IV CATH 14GX2 1/4 (CATHETERS) ×3 IMPLANT
KIT BASIN OR (CUSTOM PROCEDURE TRAY) ×3 IMPLANT
KIT ROOM TURNOVER OR (KITS) ×3 IMPLANT
LIQUID BAND (GAUZE/BANDAGES/DRESSINGS) ×3 IMPLANT
NS IRRIG 1000ML POUR BTL (IV SOLUTION) ×3 IMPLANT
PAD ARMBOARD 7.5X6 YLW CONV (MISCELLANEOUS) ×3 IMPLANT
PENCIL BUTTON HOLSTER BLD 10FT (ELECTRODE) ×3 IMPLANT
POUCH SPECIMEN RETRIEVAL 10MM (ENDOMECHANICALS) ×3 IMPLANT
SCISSORS LAP 5X35 DISP (ENDOMECHANICALS) ×3 IMPLANT
SET IRRIG TUBING LAPAROSCOPIC (IRRIGATION / IRRIGATOR) ×3 IMPLANT
SLEEVE ENDOPATH XCEL 5M (ENDOMECHANICALS) ×6 IMPLANT
SPECIMEN JAR SMALL (MISCELLANEOUS) ×3 IMPLANT
SPONGE GAUZE 4X4 12PLY STER LF (GAUZE/BANDAGES/DRESSINGS) ×3 IMPLANT
SPONGE LAP 18X18 X RAY DECT (DISPOSABLE) ×3 IMPLANT
STAPLER VISISTAT 35W (STAPLE) ×3 IMPLANT
SUCTION POOLE HANDLE (INSTRUMENTS) ×3
SUT MNCRL AB 4-0 PS2 18 (SUTURE) ×3 IMPLANT
SUT PDS AB 1 CT  36 (SUTURE) ×4
SUT PDS AB 1 CT 36 (SUTURE) ×2 IMPLANT
SUT SILK 2 0 SH CR/8 (SUTURE) ×3 IMPLANT
SYR BULB IRRIGATION 50ML (SYRINGE) ×3 IMPLANT
TAPE CLOTH SURG 6X10 WHT LF (GAUZE/BANDAGES/DRESSINGS) ×3 IMPLANT
TOWEL OR 17X24 6PK STRL BLUE (TOWEL DISPOSABLE) ×3 IMPLANT
TOWEL OR 17X26 10 PK STRL BLUE (TOWEL DISPOSABLE) ×3 IMPLANT
TRAY LAPAROSCOPIC MC (CUSTOM PROCEDURE TRAY) ×3 IMPLANT
TROCAR XCEL BLUNT TIP 100MML (ENDOMECHANICALS) ×3 IMPLANT
TROCAR XCEL NON-BLD 5MMX100MML (ENDOMECHANICALS) ×3 IMPLANT
TUBE CONNECTING 12'X1/4 (SUCTIONS) ×1
TUBE CONNECTING 12X1/4 (SUCTIONS) ×2 IMPLANT
TUBING INSUFFLATION (TUBING) ×3 IMPLANT
YANKAUER SUCT BULB TIP NO VENT (SUCTIONS) ×3 IMPLANT

## 2015-09-05 NOTE — Anesthesia Preprocedure Evaluation (Addendum)
Anesthesia Evaluation  Patient identified by MRN, date of birth, ID band Patient awake    Reviewed: Allergy & Precautions, NPO status , Patient's Chart, lab work & pertinent test results  Airway Mallampati: II   Neck ROM: full    Dental no notable dental hx. (+) Poor Dentition, Dental Advisory Given,    Pulmonary former smoker,    Pulmonary exam normal breath sounds clear to auscultation       Cardiovascular hypertension, + CAD, + CABG, + Peripheral Vascular Disease, +CHF and + DOE  Normal cardiovascular exam     Neuro/Psych    GI/Hepatic GERD  ,  Endo/Other  diabetes, Type 2Hypothyroidism   Renal/GU Renal InsufficiencyRenal disease     Musculoskeletal  (+) Arthritis ,   Abdominal Normal abdominal exam  (+)   Peds  Hematology   Anesthesia Other Findings   Reproductive/Obstetrics                           Anesthesia Physical  Anesthesia Plan  ASA: III  Anesthesia Plan: General   Post-op Pain Management:    Induction: Intravenous  Airway Management Planned: Oral ETT  Additional Equipment:   Intra-op Plan:   Post-operative Plan: Extubation in OR  Informed Consent: I have reviewed the patients History and Physical, chart, labs and discussed the procedure including the risks, benefits and alternatives for the proposed anesthesia with the patient or authorized representative who has indicated his/her understanding and acceptance.     Plan Discussed with: CRNA, Anesthesiologist and Surgeon  Anesthesia Plan Comments:         Anesthesia Quick Evaluation

## 2015-09-05 NOTE — Transfer of Care (Signed)
Immediate Anesthesia Transfer of Care Note  Patient: [name] [name]  Procedure(s) Performed: Procedure(s): ATTEMPTED LAPAROSCOPIC CHOLECYSTECTOMY, EXPLORATORY LAPAROTOMY WITH CHOLECYSTECTOMY (N/A)  Patient Location: PACU  Anesthesia Type:General  Level of Consciousness: awake, alert  and oriented  Airway & Oxygen Therapy: Patient Spontanous Breathing and Patient connected to nasal cannula oxygen  Post-op Assessment: Report given to RN and Post -op Vital signs reviewed and stable  Post vital signs: Reviewed and stable  Last Vitals:  Filed Vitals:   09/04/15 1936 09/05/15 0529  BP: 157/54 165/54  Pulse: 78 58  Temp: 36.5 C 36.4 C  Resp: 20 18    Last Pain:  Filed Vitals:   09/05/15 0947  PainSc: 2       Patients Stated Pain Goal: 0 ([id number])  Complications: No apparent anesthesia complications

## 2015-09-05 NOTE — Progress Notes (Signed)
Utilization review completed.  

## 2015-09-05 NOTE — Interval H&P Note (Signed)
History and Physical Interval Note:  09/05/2015 2:02 PM  [name] [name]  has presented today for surgery, with the diagnosis of Gallstones  The various methods of treatment have been discussed with the patient and family. After consideration of risks, benefits and other options for treatment, the patient has consented to  Procedure(s): LAPAROSCOPIC CHOLECYSTECTOMY WITH INTRAOPERATIVE CHOLANGIOGRAM (N/A) as a surgical intervention .  The patient's history has been reviewed, patient examined, no change in status, stable for surgery.  I have reviewed the patient's chart and labs.  Questions were answered to the patient's satisfaction.     TOTH III,Kelsen Celona S

## 2015-09-05 NOTE — Anesthesia Procedure Notes (Signed)
Procedure Name: Intubation Date/Time: 09/05/2015 2:45 PM Performed by: Ollen Bowl Pre-anesthesia Checklist: Patient identified, Emergency Drugs available, Suction available, Patient being monitored and Timeout performed Patient Re-evaluated:Patient Re-evaluated prior to inductionOxygen Delivery Method: Circle system utilized and Simple face mask Preoxygenation: Pre-oxygenation with 100% oxygen Intubation Type: IV induction Ventilation: Mask ventilation without difficulty Laryngoscope Size: Miller and 2 Grade View: Grade I Tube type: Oral Tube size: 7.5 mm Number of attempts: 1 Airway Equipment and Method: Patient positioned with wedge pillow and Stylet Placement Confirmation: ETT inserted through vocal cords under direct vision,  positive ETCO2 and breath sounds checked- equal and bilateral Secured at: 21 cm Tube secured with: Tape Dental Injury: Teeth and Oropharynx as per pre-operative assessment

## 2015-09-05 NOTE — Care Management Important Message (Signed)
Important Message  Patient Details  Name: [name] [name] MRN: [id number] Date of Birth: Jul 10, 1940   Medicare Important Message Given:  Yes    Aldon Hengst Abena 09/05/2015, 11:17 AM

## 2015-09-05 NOTE — Op Note (Signed)
08/28/2015 - 09/05/2015  4:40 PM  PATIENT:  [name] [name]  75 y.o. female  PRE-OPERATIVE DIAGNOSIS:  Gallstones  POST-OPERATIVE DIAGNOSIS:  Gallstones  PROCEDURE:  Procedure(s): ATTEMPTED LAPAROSCOPIC CHOLECYSTECTOMY, EXPLORATORY LAPAROTOMY WITH CHOLECYSTECTOMY (N/A)  SURGEON:  Surgeon(s) and Role:    * Jovita Kussmaul, MD - Primary  PHYSICIAN ASSISTANT:   ASSISTANTS: Sharyn Dross, RNFA   ANESTHESIA:   general  EBL:  Total I/O In: 75 [P.O.:118; IV Piggyback:250] Out: -   BLOOD ADMINISTERED:none  DRAINS: none   LOCAL MEDICATIONS USED:  MARCAINE     SPECIMEN:  Source of Specimen:  gallbladder  DISPOSITION OF SPECIMEN:  PATHOLOGY  COUNTS:  YES  TOURNIQUET:  * No tourniquets in log *  DICTATION: .Dragon Dictation   After informed consent was obtained the patient was brought to the operating room and placed in the supine position on the operating room table. After adequate induction of general anesthesia the patient's abdomen was prepped with ChloraPrep, allowed to dry, and draped in usual sterile manner. An appropriate timeout was performed.  The area above the umbilicus was infiltrated with quarter percent Marcaine. A small incision was made with a 15 blade knife. The incision was carried through the subcutaneous tissue bluntly with a hemostat and [employer] retractors until the linea alba was identified. The linea alba was incised with the 15 blade knife. Each side was grasped with Coker clamps and elevated anteriorly. The preperitoneal space was  Probed bluntly with a hemostat until the peritoneum was opened and access was gained of the abdominal cavity. A 0 Vicryl pursestring stitch was placed in the fascia surrounding the opening. a Hassan cannula was placed Through the opening and anchored in place with the previously placed Vicryl pursestring stitch. The abdomen was insufflated with carbon dioxide without difficulty. The camera was then placed through the [facility] cannula  and the abdomen was inspected. There were dense adhesions throughout the abdominal cavity. I was able to find a free space in the right lateral abdominal cavity. I placed a 5 mm port into this space under direct vision without difficulty. I was able to see the edge of the liver but there were significant adhesions of bowel to the abdominal wall and I could not reliably identify the gallbladder.At this point I made a right subcostal incision with a 10 blade knife. The incision was carried through the skin and subcutaneous tissue sharply with the electrocautery until the fascia of the anterior abdominal wall was encountered. The fascia and muscle was divided sharply with the electrocautery. The peritoneum was then opened with the cautery and the incision was opened and the rest of the way under direct vision.  A Bookwalter retractor was deployed. The adhesions to the liver edge were taken down sharply with Metzenbaum scissors. I was then able to identify thestomach and duodenum as well as the gallbladder. The gallbladder was dissected away from the liver in a top-down fashion. The gallbladder neck cystic duct junction was then readily identified. A right angle clamp was placed across the gallbladder neck cystic duct junction and the gallbladder neck was divided with Metzenbaum scissors. The gallbladder was removed and sent to pathology.  The cystic duct was controlled with a 2-0 silk tie.The cystic artery was also identified and controlled with clips.  Once this was accomplished the liver bed was examined and found to be hemostatic. The wound was irrigated with copious amounts of saline. The retractors were then removed. The fascia of the abdominal wall was closed  with 2 running #1 PDS sutures. The subcutaneous tissue was irrigated with copious amounts of saline. The skin was closed with staples. The fascial defect at the supraumbilical incision was closed iously placed Vicryl pursestring stitch.The patient tolerated  the procedure well. At the end of the case all needle sponge and instrument counts were correct. The patient was then awakened and taken to recovery in stable condition.  PLAN OF CARE: Admit to inpatient   PATIENT DISPOSITION:  PACU - hemodynamically stable.   Delay start of Pharmacological VTE agent (>24hrs) due to surgical blood loss or risk of bleeding: no

## 2015-09-05 NOTE — Progress Notes (Signed)
PROGRESS NOTE    [name] [name]  [id number] DOB: [birth year] DOA: 08/28/2015 PCP: [name], MD   Brief Narrative:  75 y.o. Female with past medical history hypertension, diabetes, dyslipidemia, chronic kidney disease stage IV. She presented to Dr. Ronnie Derby office because of inability to walk. She was found to have low blood pressure and liver function enzymes were elevated. She was sent to ER for further evaluation.  In ED, patient was hemodynamically stable. Blood work was notable for leukocytosis of 12.5, hemoglobin 8.1, platelets 405, creatinine 2.48. Abdominal ultrasound showed cholelithiasis without sonographic evidence of acute cholecystitis. She has mild right hydronephrosis with ureteral stent and then severe left hydronephrosis. MRI of thoracic spine did not show spinal stenosis despite chronic T12 compression fracture. 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 She was also found to have 2 right-sided pulmonary nodules 8 mm and 5 mm and CT at 3-6 months is recommended for follow-up and if the nodules are stable at the time of repeat and further CT scan is suggested at 18-24 months for low risk patients option of but recommended for high-risk patients. 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 In regards to cholelithiasis, GI has seen her in consultation. Patient needs to be off of Plavix for 5 days prior to ERCP.  Assessment & Plan   Hypotension with weakness  -Hypotension resolved -No evidence of spinal stenosis in the thoracic spine however she does probably have some stenosis in cervical spine  -Troponin level normal -PT evaluated patient, no further therapy needed  Cholelithiasis/Choledocolithiasis  -No sonographic evidence of acute cholecystitis -LFT's improving  -AST 60, ALT 45, ALP 294 and normal bilirubin on 6/26 -Plavix held 5  days (starting 6/23) for ERCP with sphincterotomy and stone extraction on 6/28:  -Acute hepatitis panel negative -General surgery consulted and appreciated for cholecystectomy - likely today 09/05/2015 -Cardiology consulted for clearance, felt patient is able to proceed with surgery  Urinary tract infection / leukocytosis -Leukocytosis resolved -Large leukocytes on admission with many bacteria -Ancef discontinued -Urine culture with multiple species none predominant   Hypothyroidism -Continue Synthroid -TSH within normal limits  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 She was on Bactrim which could have exacerbated kidney function. Bactrim on hold -Creatinine continues to improve, 1.36 -Continue to monitor BMP  Anemia of chronic kidney disease -Hgb 7.3 on 6/24 -Given 1 U PRBC 6/24 -GI mentioned they may do EGD when they do ERCP but will follow up on their recomedations -Her hemoglobin is stable at 9.3 -Continue to monitor CBC  Severe protein calorie malnutrition -In the context of chronic illness -Seen by dietician   DVT Prophylaxis  SCDs  Code Status: Full  Family Communication: None at bedside  Disposition Plan: Admitted. Pending lap chole today.  Consultants Gastroenterology  General surgery Cardiology   Procedures  Echocardiogram ERCP  Antibiotics   Anti-infectives    Start     Dose/Rate Route Frequency Ordered Stop   09/03/15 1300  [MAR Hold]  ampicillin-sulbactam (UNASYN) 1.5 g in sodium chloride 0.9 % 50 mL IVPB     (MAR Hold since 09/03/15 1219)  1.5 g 100 mL/hr over 30 Minutes Intravenous To ShortStay Surgical 09/03/15 0733 09/03/15 1347   08/29/15 0000   ceFAZolin (ANCEF) 500 mg in dextrose 5 % 100 mL IVPB  Status:  Discontinued     500 mg 210 mL/hr over 30 Minutes Intravenous Every 12 hours 08/28/15 2307 09/01/15 1623   08/28/15 2230  sulfamethoxazole-trimethoprim (BACTRIM DS,SEPTRA DS) 800-160 MG per tablet 1 tablet     1 tablet Oral  Once 08/28/15 2215 08/28/15 2232      Subjective:   [name] [name] seen and examined today.  Patient denies nausea, vomiting, abdominal pain. Was able to have a bowel movement overnight.  Wonders when she can actually eat food. Denies chest pain, shortness of breath.    Objective:   Filed Vitals:   09/04/15 0613 09/04/15 1406 09/04/15 1936 09/05/15 0529  BP: 134/61 158/40 157/54 165/54  Pulse: 72 66 78 58  Temp: 97.3 F (36.3 C) 97.4 F (36.3 C) 97.7 F (36.5 C) 97.6 F (36.4 C)  TempSrc: Oral Oral Oral Oral  Resp: 18 17 20 18   Height:      Weight: 58.559 kg (129 lb 1.6 oz)   58.559 kg (129 lb 1.6 oz)  SpO2: 100% 100% 100% 98%    Intake/Output Summary (Last 24 hours) at 09/05/15 1100 Last data filed at 09/05/15 0918  Gross per 24 hour  Intake    118 ml  Output    901 ml  Net   -783 ml   Filed Weights   09/03/15 0410 09/04/15 0613 09/05/15 0529  Weight: 59.058 kg (130 lb 3.2 oz) 58.559 kg (129 lb 1.6 oz) 58.559 kg (129 lb 1.6 oz)    Exam  General: Well developed, well nourished, no apparent distress  HEENT: NCAT, mucous membranes moist.   Cardiovascular: S1 S2 auscultated, RRR  Respiratory: Clear to auscultation   Abdomen: Soft, nontender, nondistended, + bowel sounds, +hernia  Extremities: warm dry without cyanosis clubbing or edema  Neuro: AAOx3, nonfocal  Psych: Normal affect and demeanor, pleasant   Data Reviewed: I have personally reviewed following labs and imaging studies  CBC:  Recent Labs Lab 08/31/15 0259 09/01/15 0305 09/02/15 0723 09/04/15 0218 09/05/15 0249  WBC 7.6 10.1 9.4 9.0 8.2  HGB 8.7* 9.6* 9.5* 9.4* 9.3*  HCT 28.1* 31.5* 31.5* 30.2* 31.2*    MCV 80.3 81.2 81.8 83.2 86.9  PLT 296 354 348 367 [id number]   Basic Metabolic Panel:  Recent Labs Lab 08/31/15 0259 09/01/15 0305 09/02/15 0723 09/04/15 0218 09/05/15 0249  NA 139 135 141 139 137  K 4.4 4.7 4.3 5.0 5.1  CL 112* 107 107 109 105  CO2 21* 20* 24 22 25   GLUCOSE 123* 151* 163* 144* 159*  BUN 27* 26* 27* 30* 27*  CREATININE 1.89* 1.69* 1.66* 1.52* 1.36*  CALCIUM 8.2* 8.3* 9.3 9.1 9.1   GFR: Estimated Creatinine Clearance: 33.6 mL/min (by C-G formula based on Cr of 1.36). Liver Function Tests:  Recent Labs Lab 08/30/15 0340 08/31/15 0259 09/01/15 0305 09/04/15 0218  AST 55* 46* 60* 45*  ALT 109* 61* 45 60*  ALKPHOS 315* 308* 294* 222*  BILITOT 0.3 0.6 1.1 0.5  PROT 4.9* 5.3* 5.7* 5.7*  ALBUMIN 1.7* 1.7* 1.8* 2.0*   No results for input(s): LIPASE, AMYLASE in the last 168 hours. No results for input(s): AMMONIA in the last 168 hours. Coagulation Profile: No results for input(s): INR, PROTIME in the last 168 hours. Cardiac Enzymes:  Recent Labs Lab 08/29/15  1105 08/29/15 1526  TROPONINI <0.03 <0.03   BNP (last 3 results) No results for input(s): PROBNP in the last 8760 hours. HbA1C: No results for input(s): HGBA1C in the last 72 hours. CBG:  Recent Labs Lab 09/04/15 0617 09/04/15 1139 09/04/15 1606 09/04/15 2136 09/05/15 0625  GLUCAP 153* 174* 135* 175* 177*   Lipid Profile: No results for input(s): CHOL, HDL, LDLCALC, TRIG, CHOLHDL, LDLDIRECT in the last 72 hours. Thyroid Function Tests: No results for input(s): TSH, T4TOTAL, FREET4, T3FREE, THYROIDAB in the last 72 hours. Anemia Panel: No results for input(s): VITAMINB12, FOLATE, FERRITIN, TIBC, IRON, RETICCTPCT in the last 72 hours. Urine analysis:    Component Value Date/Time   COLORURINE YELLOW 08/28/2015 1812   APPEARANCEUR TURBID* 08/28/2015 1812   LABSPEC 1.021 08/28/2015 1812   PHURINE 5.5 08/28/2015 1812   GLUCOSEU NEGATIVE 08/28/2015 1812   GLUCOSEU NEGATIVE 07/03/2015 1352    HGBUR LARGE* 08/28/2015 1812   BILIRUBINUR NEGATIVE 08/28/2015 1812   KETONESUR 15* 08/28/2015 1812   PROTEINUR >300* 08/28/2015 1812   UROBILINOGEN 0.2 07/03/2015 1352   NITRITE NEGATIVE 08/28/2015 1812   LEUKOCYTESUR LARGE* 08/28/2015 1812   Sepsis Labs: @LABRCNTIP (procalcitonin:4,lacticidven:4)  ) Recent Results (from the past 240 hour(s))  Urine culture     Status: Abnormal   Collection Time: 08/28/15  6:31 PM  Result Value Ref Range Status   Specimen Description URINE, RANDOM  Final   Special Requests Immunocompromised  Final   Culture MULTIPLE SPECIES PRESENT, SUGGEST RECOLLECTION (A)  Final   Report Status 08/30/2015 FINAL  Final  MRSA PCR Screening     Status: None   Collection Time: 08/29/15  6:11 AM  Result Value Ref Range Status   MRSA by PCR NEGATIVE NEGATIVE Final    Comment:        The GeneXpert MRSA Assay (FDA approved for NASAL specimens only), is one component of a comprehensive MRSA colonization surveillance program. It is not intended to diagnose MRSA infection nor to guide or monitor treatment for MRSA infections.       Radiology Studies: Dg Chest Port 1 View  09/03/2015  CLINICAL DATA:  Shortness of breath EXAM: PORTABLE CHEST 1 VIEW COMPARISON:  CT 08/28/2015 FINDINGS: Prior CABG. Left basilar scarring. Heart is normal size. No acute confluent airspace opacities or effusions. No acute bony abnormality. IMPRESSION: No active disease. Electronically Signed   By: Rolm Baptise M.D.   On: 09/03/2015 15:34   Dg Ercp  09/03/2015  CLINICAL DATA:  75 year old female with cholelithiasis and choledocholithiasis EXAM: ERCP TECHNIQUE: Multiple spot images obtained with the fluoroscopic device and submitted for interpretation post-procedure. FLUOROSCOPY TIME:  If the device does not provide the exposure index: Fluoroscopy Time: 2 minutes reported. Please see GI note for further detail. Number of Acquired Images:  0 COMPARISON:  Right upper quadrant ultrasound  08/29/2015 FINDINGS: A total of 7 intraoperative spot images demonstrate a flexible endoscope in the descending duodenum followed by cannulation of the common bile duct and cholangiogram. Multiple faceted filling defects are present within the knee markedly dilated common duct concerning for choledocholithiasis. Subsequent images demonstrate sphincterotomy and balloon sweep of the common bile duct. Incidentally, there is a right-sided double-J ureteral stent which is partially imaged. IMPRESSION: 1. Choledocholithiasis with moderate intra and extrahepatic biliary ductal dilatation. 2. ERCP with sphincterotomy and balloon sweep of the bile duct. These images were submitted for radiologic interpretation only. Please see the procedural report for the amount of contrast and the fluoroscopy time utilized. Electronically Signed  By: Jacqulynn Cadet M.D.   On: 09/03/2015 14:50     Scheduled Meds: . aspirin  81 mg Oral QHS  . docusate sodium  100 mg Oral BID  . feeding supplement (GLUCERNA SHAKE)  237 mL Oral TID BM  . insulin aspart  0-9 Units Subcutaneous TID WC  . latanoprost  1 drop Both Eyes QHS  . levothyroxine  125 mcg Oral QAC breakfast  . multivitamin with minerals  1 tablet Oral Daily  . pantoprazole  40 mg Oral Daily  . sodium chloride flush  3 mL Intravenous Q12H   Continuous Infusions:     LOS: 8 days   Time Spent in minutes   30 minutes  Lucy Woolever D.O. on 09/05/2015 at 11:00 AM  Between 7am to 7pm - Pager - [phone]  After 7pm go to [email or web address] - password [email or web address]  And look for the night coverage person covering for me after hours  [facility] Office  [phone]

## 2015-09-05 NOTE — Progress Notes (Signed)
2 Days Post-Op  Subjective: No complaints.  Objective: Vital signs in last 24 hours: Temp:  [97.4 F (36.3 C)-97.7 F (36.5 C)] 97.6 F (36.4 C) (06/30 0529) Pulse Rate:  [58-78] 58 (06/30 0529) Resp:  [17-20] 18 (06/30 0529) BP: (157-165)/(40-54) 165/54 mmHg (06/30 0529) SpO2:  [98 %-100 %] 98 % (06/30 0529) Weight:  [58.559 kg (129 lb 1.6 oz)] 58.559 kg (129 lb 1.6 oz) (06/30 0529) Last BM Date: 09/04/15  Intake/Output from previous day: 06/29 0701 - 06/30 0700 In: -  Out: 1002 [Urine:1000; Stool:2] Intake/Output this shift:    Resp: clear to auscultation bilaterally Cardio: regular rate and rhythm GI: soft, nontender  Lab Results:   Recent Labs  09/04/15 0218 09/05/15 0249  WBC 9.0 8.2  HGB 9.4* 9.3*  HCT 30.2* 31.2*  PLT 367 388   BMET  Recent Labs  09/04/15 0218 09/05/15 0249  NA 139 137  K 5.0 5.1  CL 109 105  CO2 22 25  GLUCOSE 144* 159*  BUN 30* 27*  CREATININE 1.52* 1.36*  CALCIUM 9.1 9.1   PT/INR No results for input(s): LABPROT, INR in the last 72 hours. ABG No results for input(s): PHART, HCO3 in the last 72 hours.  Invalid input(s): PCO2, PO2  Studies/Results: Dg Chest Port 1 View  09/03/2015  CLINICAL DATA:  Shortness of breath EXAM: PORTABLE CHEST 1 VIEW COMPARISON:  CT 08/28/2015 FINDINGS: Prior CABG. Left basilar scarring. Heart is normal size. No acute confluent airspace opacities or effusions. No acute bony abnormality. IMPRESSION: No active disease. Electronically Signed   By: Rolm Baptise M.D.   On: 09/03/2015 15:34   Dg Ercp  09/03/2015  CLINICAL DATA:  75 year old female with cholelithiasis and choledocholithiasis EXAM: ERCP TECHNIQUE: Multiple spot images obtained with the fluoroscopic device and submitted for interpretation post-procedure. FLUOROSCOPY TIME:  If the device does not provide the exposure index: Fluoroscopy Time: 2 minutes reported. Please see GI note for further detail. Number of Acquired Images:  0 COMPARISON:   Right upper quadrant ultrasound 08/29/2015 FINDINGS: A total of 7 intraoperative spot images demonstrate a flexible endoscope in the descending duodenum followed by cannulation of the common bile duct and cholangiogram. Multiple faceted filling defects are present within the knee markedly dilated common duct concerning for choledocholithiasis. Subsequent images demonstrate sphincterotomy and balloon sweep of the common bile duct. Incidentally, there is a right-sided double-J ureteral stent which is partially imaged. IMPRESSION: 1. Choledocholithiasis with moderate intra and extrahepatic biliary ductal dilatation. 2. ERCP with sphincterotomy and balloon sweep of the bile duct. These images were submitted for radiologic interpretation only. Please see the procedural report for the amount of contrast and the fluoroscopy time utilized. Electronically Signed   By: Jacqulynn Cadet M.D.   On: 09/03/2015 14:50    Anti-infectives: Anti-infectives    Start     Dose/Rate Route Frequency Ordered Stop   09/03/15 1300  [MAR Hold]  ampicillin-sulbactam (UNASYN) 1.5 g in sodium chloride 0.9 % 50 mL IVPB     (MAR Hold since 09/03/15 1219)   1.5 g 100 mL/hr over 30 Minutes Intravenous To ShortStay Surgical 09/03/15 0733 09/03/15 1347   08/29/15 0000  ceFAZolin (ANCEF) 500 mg in dextrose 5 % 100 mL IVPB  Status:  Discontinued     500 mg 210 mL/hr over 30 Minutes Intravenous Every 12 hours 08/28/15 2307 09/01/15 1623   08/28/15 2230  sulfamethoxazole-trimethoprim (BACTRIM DS,SEPTRA DS) 800-160 MG per tablet 1 tablet     1 tablet Oral  Once 08/28/15 2215 08/28/15 2232      Assessment/Plan: s/p Procedure(s): ENDOSCOPIC RETROGRADE CHOLANGIOPANCREATOGRAPHY (ERCP) (N/A) Plan for lap chole with ioc today.  I have discussed with her the risks and benefits of the surgery as well as some of the technical aspects and she understands and wishes to proceed. I will talk with daughter this am if she arrives.  LOS: 8 days     TOTH III,Detria Cummings S 09/05/2015

## 2015-09-05 NOTE — Progress Notes (Signed)
PT Cancellation Note  Patient Details Name: [name] [name] MRN: [id number] DOB: January 24, 1941   Cancelled Treatment:    Reason Eval/Treat Not Completed: Patient at procedure or test/unavailable; patient for cholecystectomy.  Will attempt again another day.    Reginia Naas 09/05/2015, 1:44 PM  Magda Kiel, [place] 09/05/2015

## 2015-09-05 NOTE — H&P (View-Only) (Signed)
2 Days Post-Op  Subjective: No complaints.  Objective: Vital signs in last 24 hours: Temp:  [97.4 F (36.3 C)-97.7 F (36.5 C)] 97.6 F (36.4 C) (06/30 0529) Pulse Rate:  [58-78] 58 (06/30 0529) Resp:  [17-20] 18 (06/30 0529) BP: (157-165)/(40-54) 165/54 mmHg (06/30 0529) SpO2:  [98 %-100 %] 98 % (06/30 0529) Weight:  [58.559 kg (129 lb 1.6 oz)] 58.559 kg (129 lb 1.6 oz) (06/30 0529) Last BM Date: 09/04/15  Intake/Output from previous day: 06/29 0701 - 06/30 0700 In: -  Out: 1002 [Urine:1000; Stool:2] Intake/Output this shift:    Resp: clear to auscultation bilaterally Cardio: regular rate and rhythm GI: soft, nontender  Lab Results:   Recent Labs  09/04/15 0218 09/05/15 0249  WBC 9.0 8.2  HGB 9.4* 9.3*  HCT 30.2* 31.2*  PLT 367 388   BMET  Recent Labs  09/04/15 0218 09/05/15 0249  NA 139 137  K 5.0 5.1  CL 109 105  CO2 22 25  GLUCOSE 144* 159*  BUN 30* 27*  CREATININE 1.52* 1.36*  CALCIUM 9.1 9.1   PT/INR No results for input(s): LABPROT, INR in the last 72 hours. ABG No results for input(s): PHART, HCO3 in the last 72 hours.  Invalid input(s): PCO2, PO2  Studies/Results: Dg Chest Port 1 View  09/03/2015  CLINICAL DATA:  Shortness of breath EXAM: PORTABLE CHEST 1 VIEW COMPARISON:  CT 08/28/2015 FINDINGS: Prior CABG. Left basilar scarring. Heart is normal size. No acute confluent airspace opacities or effusions. No acute bony abnormality. IMPRESSION: No active disease. Electronically Signed   By: Rolm Baptise M.D.   On: 09/03/2015 15:34   Dg Ercp  09/03/2015  CLINICAL DATA:  75 year old female with cholelithiasis and choledocholithiasis EXAM: ERCP TECHNIQUE: Multiple spot images obtained with the fluoroscopic device and submitted for interpretation post-procedure. FLUOROSCOPY TIME:  If the device does not provide the exposure index: Fluoroscopy Time: 2 minutes reported. Please see GI note for further detail. Number of Acquired Images:  0 COMPARISON:   Right upper quadrant ultrasound 08/29/2015 FINDINGS: A total of 7 intraoperative spot images demonstrate a flexible endoscope in the descending duodenum followed by cannulation of the common bile duct and cholangiogram. Multiple faceted filling defects are present within the knee markedly dilated common duct concerning for choledocholithiasis. Subsequent images demonstrate sphincterotomy and balloon sweep of the common bile duct. Incidentally, there is a right-sided double-J ureteral stent which is partially imaged. IMPRESSION: 1. Choledocholithiasis with moderate intra and extrahepatic biliary ductal dilatation. 2. ERCP with sphincterotomy and balloon sweep of the bile duct. These images were submitted for radiologic interpretation only. Please see the procedural report for the amount of contrast and the fluoroscopy time utilized. Electronically Signed   By: Jacqulynn Cadet M.D.   On: 09/03/2015 14:50    Anti-infectives: Anti-infectives    Start     Dose/Rate Route Frequency Ordered Stop   09/03/15 1300  [MAR Hold]  ampicillin-sulbactam (UNASYN) 1.5 g in sodium chloride 0.9 % 50 mL IVPB     (MAR Hold since 09/03/15 1219)   1.5 g 100 mL/hr over 30 Minutes Intravenous To ShortStay Surgical 09/03/15 0733 09/03/15 1347   08/29/15 0000  ceFAZolin (ANCEF) 500 mg in dextrose 5 % 100 mL IVPB  Status:  Discontinued     500 mg 210 mL/hr over 30 Minutes Intravenous Every 12 hours 08/28/15 2307 09/01/15 1623   08/28/15 2230  sulfamethoxazole-trimethoprim (BACTRIM DS,SEPTRA DS) 800-160 MG per tablet 1 tablet     1 tablet Oral  Once 08/28/15 2215 08/28/15 2232      Assessment/Plan: s/p Procedure(s): ENDOSCOPIC RETROGRADE CHOLANGIOPANCREATOGRAPHY (ERCP) (N/A) Plan for lap chole with ioc today.  I have discussed with her the risks and benefits of the surgery as well as some of the technical aspects and she understands and wishes to proceed. I will talk with daughter this am if she arrives.  LOS: 8 days     TOTH III,PAUL S 09/05/2015

## 2015-09-05 NOTE — Progress Notes (Signed)
Pt received from PACU. Pt complaining of greater than 10/10 sharp pain in abdomen. Given 150 mcg Fentanyl and 2 Vicodin in PACU. Obtained verbal order for 2-4 mg morphine PRN. Pt abdominal dressings clean, dry, and intact. Telemetry applied, vitals taken.  Fritz Pickerel, RN

## 2015-09-05 NOTE — Care Management Note (Signed)
Case Management Note Marvetta Gibbons RN, BSN Unit 2W-Case Manager [phone]  Patient Details  Name: [name] [name] MRN: [id number] Date of Birth: [birth year]  Subjective/Objective:  Pt admitted with hypotension - Plavix held 5 days (starting 6/23) for ERCP with sphincterotomy and stone extraction on 6/28:  --General surgery consulted and appreciated for cholecystectomy plan for lap chole today 09/05/2015                  Action/Plan: PTA pt lived at home alone- anticipate return home- CM to follow  Expected Discharge Date:                  Expected Discharge Plan:  Home/Self Care  In-House Referral:     Discharge planning Services  CM Consult  Post Acute Care Choice:    Choice offered to:     DME Arranged:    DME Agency:     HH Arranged:    HH Agency:     Status of Service:  In process, will continue to follow  If discussed at Long Length of Stay Meetings, dates discussed:    Additional Comments:  Dawayne Jadene, RN 09/05/2015, 11:15 AM

## 2015-09-05 NOTE — Discharge Instructions (Signed)
Laparoscopic Cholecystectomy, Care After °Refer to this sheet in the next few weeks. These instructions provide you with information about caring for yourself after your procedure. Your health care provider may also give you more specific instructions. Your treatment has been planned according to current medical practices, but problems sometimes occur. Call your health care provider if you have any problems or questions after your procedure. °WHAT TO EXPECT AFTER THE PROCEDURE °After your procedure, it is common to have: °· Pain at your incision sites. You will be given pain medicines to control your pain. °· Mild nausea or vomiting. This should improve after the first 24 hours. °· Bloating and possible shoulder pain from the gas that was used during the procedure. This will improve after the first 24 hours. °HOME CARE INSTRUCTIONS °Incision Care °· Follow instructions from your health care provider about how to take care of your incisions. Make sure you: °¨ Wash your hands with soap and water before you change your bandage (dressing). If soap and water are not available, use hand sanitizer. °¨ Change your dressing as told by your health care provider. °¨ Leave stitches (sutures), skin glue, or adhesive strips in place. These skin closures may need to be in place for 2 weeks or longer. If adhesive strip edges start to loosen and curl up, you may trim the loose edges. Do not remove adhesive strips completely unless your health care provider tells you to do that. °· Do not take baths, swim, or use a hot tub until your health care provider approves. Ask your health care provider if you can take showers. You may only be allowed to take sponge baths for bathing. °General Instructions °· Take over-the-counter and prescription medicines only as told by your health care provider. °· Do not drive or operate heavy machinery while taking prescription pain medicine. °· Return to your normal diet as told by your health care  provider. °· Do not lift anything that is heavier than 10 lb (4.5 kg). °· Do not play contact sports for one week or until your health care provider approves. °SEEK MEDICAL CARE IF:  °· You have redness, swelling, or pain at the site of your incision. °· You have fluid, blood, or pus coming from your incision. °· You notice a bad smell coming from your incision area. °· Your surgical incisions break open. °· You have a fever. °SEEK IMMEDIATE MEDICAL CARE IF: °· You develop a rash. °· You have difficulty breathing. °· You have chest pain. °· You have increasing pain in your shoulders (shoulder strap areas). °· You faint or have dizzy episodes while you are standing. °· You have severe pain in your abdomen. °· You have nausea or vomiting that lasts for more than one day. °  °This information is not intended to replace advice given to you by your health care provider. Make sure you discuss any questions you have with your health care provider. °  °Document Released: 02/22/2005 Document Revised: 11/13/2014 Document Reviewed: 10/04/2012 °Elsevier Interactive Patient Education ©2016 [employer] °CCS ______[facility] °LAPAROSCOPIC SURGERY: POST OP INSTRUCTIONS °Always review your discharge instruction sheet given to you by the facility where your surgery was performed. °IF YOU HAVE DISABILITY OR FAMILY LEAVE FORMS, YOU MUST BRING THEM TO THE OFFICE FOR PROCESSING.   °DO NOT GIVE THEM TO YOUR DOCTOR. ° °1. A prescription for pain medication may be given to you upon discharge.  Take your pain medication as prescribed, if needed.  If narcotic   pain medicine is not needed, then you may take acetaminophen (Tylenol) or ibuprofen (Advil) as needed. °2. Take your usually prescribed medications unless otherwise directed. °3. If you need a refill on your pain medication, please contact your pharmacy.  They will contact our office to request authorization. Prescriptions will not be filled after 5pm or on  week-ends. °4. You should follow a light diet the first few days after arrival home, such as soup and crackers, etc.  Be sure to include lots of fluids daily. °5. Most patients will experience some swelling and bruising in the area of the incisions.  Ice packs will help.  Swelling and bruising can take several days to resolve.  °6. It is common to experience some constipation if taking pain medication after surgery.  Increasing fluid intake and taking a stool softener (such as Colace) will usually help or prevent this problem from occurring.  A mild laxative (Milk of Magnesia or Miralax) should be taken according to package instructions if there are no bowel movements after 48 hours. °7. Unless discharge instructions indicate otherwise, you may remove your bandages 24-48 hours after surgery, and you may shower at that time.  You may have steri-strips (small skin tapes) in place directly over the incision.  These strips should be left on the skin for 7-10 days.  If your surgeon used skin glue on the incision, you may shower in 24 hours.  The glue will flake off over the next 2-3 weeks.  Any sutures or staples will be removed at the office during your follow-up visit. °8. ACTIVITIES:  You may resume regular (light) daily activities beginning the next day--such as daily self-care, walking, climbing stairs--gradually increasing activities as tolerated.  You may have sexual intercourse when it is comfortable.  Refrain from any heavy lifting or straining until approved by your doctor. °a. You may drive when you are no longer taking prescription pain medication, you can comfortably wear a seatbelt, and you can safely maneuver your car and apply brakes. °b. RETURN TO WORK:  __________________________________________________________ °9. You should see your doctor in the office for a follow-up appointment approximately 2-3 weeks after your surgery.  Make sure that you call for this appointment within a day or two after you  arrive home to insure a convenient appointment time. °10. OTHER INSTRUCTIONS: __________________________________________________________________________________________________________________________ __________________________________________________________________________________________________________________________ °WHEN TO CALL YOUR DOCTOR: °1. Fever over 101.0 °2. Inability to urinate °3. Continued bleeding from incision. °4. Increased pain, redness, or drainage from the incision. °5. Increasing abdominal pain ° °The clinic staff is available to answer your questions during regular business hours.  Please don’t hesitate to call and ask to speak to one of the nurses for clinical concerns.  If you have a medical emergency, go to the nearest emergency room or call 911.  A surgeon from [facility] is always on call at the hospital. °[address], [address], [place], [place]  [address] ? [address] [address], [place],    [address] °[phone] ? [phone] ? [phone] [phone] °Web site: [email or web address] ° °

## 2015-09-05 NOTE — Anesthesia Postprocedure Evaluation (Signed)
Anesthesia Post Note  Patient: [name] [name]  Procedure(s) Performed: Procedure(s) (LRB): ATTEMPTED LAPAROSCOPIC CHOLECYSTECTOMY, EXPLORATORY LAPAROTOMY WITH CHOLECYSTECTOMY (N/A)  Patient location during evaluation: PACU Anesthesia Type: General Level of consciousness: awake and alert Pain management: pain level controlled Vital Signs Assessment: post-procedure vital signs reviewed and stable Respiratory status: spontaneous breathing, nonlabored ventilation, respiratory function stable and patient connected to nasal cannula oxygen Cardiovascular status: blood pressure returned to baseline and stable Postop Assessment: no signs of nausea or vomiting Anesthetic complications: no    Last Vitals:  Filed Vitals:   09/05/15 1745 09/05/15 1800  BP: 168/62   Pulse: 72   Temp:  36.8 C  Resp: 20     Last Pain:  Filed Vitals:   09/05/15 1801  PainSc: 10-Worst pain ever                 Montez Hageman

## 2015-09-06 ENCOUNTER — Encounter (HOSPITAL_COMMUNITY): Payer: Self-pay | Admitting: General Surgery

## 2015-09-06 LAB — GLUCOSE, CAPILLARY
GLUCOSE-CAPILLARY: 147 mg/dL — AB (ref 65–99)
GLUCOSE-CAPILLARY: 146 mg/dL — AB (ref 65–99)
GLUCOSE-CAPILLARY: 157 mg/dL — AB (ref 65–99)
Glucose-Capillary: 151 mg/dL — ABNORMAL HIGH (ref 65–99)

## 2015-09-06 LAB — COMPREHENSIVE METABOLIC PANEL
ALK PHOS: 175 U/L — AB (ref 38–126)
ALT: 44 U/L (ref 14–54)
ANION GAP: 4 — AB (ref 5–15)
AST: 62 U/L — ABNORMAL HIGH (ref 15–41)
Albumin: 2.2 g/dL — ABNORMAL LOW (ref 3.5–5.0)
BILIRUBIN TOTAL: 0.4 mg/dL (ref 0.3–1.2)
BUN: 24 mg/dL — ABNORMAL HIGH (ref 6–20)
CALCIUM: 8.8 mg/dL — AB (ref 8.9–10.3)
CO2: 24 mmol/L (ref 22–32)
Chloride: 107 mmol/L (ref 101–111)
Creatinine, Ser: 1.33 mg/dL — ABNORMAL HIGH (ref 0.44–1.00)
GFR, EST AFRICAN AMERICAN: 44 mL/min — AB (ref >60)
GFR, EST NON AFRICAN AMERICAN: 38 mL/min — AB (ref >60)
GLUCOSE: 163 mg/dL — AB (ref 65–99)
POTASSIUM: 6.2 mmol/L — AB (ref 3.5–5.1)
Sodium: 135 mmol/L (ref 135–145)
TOTAL PROTEIN: 5.4 g/dL — AB (ref 6.5–8.1)

## 2015-09-06 LAB — BASIC METABOLIC PANEL
Anion gap: 7 (ref 5–15)
BUN: 24 mg/dL — AB (ref 6–20)
CO2: 26 mmol/L (ref 22–32)
CREATININE: 1.32 mg/dL — AB (ref 0.44–1.00)
Calcium: 9 mg/dL (ref 8.9–10.3)
Chloride: 104 mmol/L (ref 101–111)
GFR calc Af Amer: 45 mL/min — ABNORMAL LOW (ref >60)
GFR, EST NON AFRICAN AMERICAN: 39 mL/min — AB (ref >60)
GLUCOSE: 148 mg/dL — AB (ref 65–99)
POTASSIUM: 6 mmol/L — AB (ref 3.5–5.1)
Sodium: 137 mmol/L (ref 135–145)

## 2015-09-06 LAB — CBC
HCT: 29.5 % — ABNORMAL LOW (ref 36.0–46.0)
Hemoglobin: 8.7 g/dL — ABNORMAL LOW (ref 12.0–15.0)
MCH: 25.5 pg — AB (ref 26.0–34.0)
MCHC: 29.5 g/dL — ABNORMAL LOW (ref 30.0–36.0)
MCV: 86.5 fL (ref 78.0–100.0)
PLATELETS: 385 10*3/uL (ref 150–400)
RBC: 3.41 MIL/uL — AB (ref 3.87–5.11)
RDW: 17 % — ABNORMAL HIGH (ref 11.5–15.5)
WBC: 12.2 10*3/uL — ABNORMAL HIGH (ref 4.0–10.5)

## 2015-09-06 LAB — POTASSIUM
POTASSIUM: 4.7 mmol/L (ref 3.5–5.1)
Potassium: 5.2 mmol/L — ABNORMAL HIGH (ref 3.5–5.1)
Potassium: 5.7 mmol/L — ABNORMAL HIGH (ref 3.5–5.1)
Potassium: 5.2 mmol/L — ABNORMAL HIGH (ref 3.5–5.1)

## 2015-09-06 MED ORDER — SODIUM POLYSTYRENE SULFONATE 15 GM/60ML PO SUSP
15.0000 g | Freq: Once | ORAL | Status: AC
  Administered 2015-09-06: 15 g via ORAL
  Filled 2015-09-06: qty 60

## 2015-09-06 NOTE — Progress Notes (Signed)
PROGRESS NOTE    [name] [name]  [id number] DOB: 1940-10-12 DOA: 08/28/2015 PCP: [name], MD   Brief Narrative:  75 y.o. Female with past medical history hypertension, diabetes, dyslipidemia, chronic kidney disease stage IV. She presented to Dr. Ronnie Derby office because of inability to walk. She was found to have low blood pressure and liver function enzymes were elevated. She was sent to ER for further evaluation.  In ED, patient was hemodynamically stable. Blood work was notable for leukocytosis of 12.5, hemoglobin 8.1, platelets 405, creatinine 2.48. Abdominal ultrasound showed cholelithiasis without sonographic evidence of acute cholecystitis. She has mild right hydronephrosis with ureteral stent and then severe left hydronephrosis. MRI of thoracic spine did not show spinal stenosis despite chronic T12 compression fracture. 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 She was also found to have 2 right-sided pulmonary nodules 8 mm and 5 mm and CT at 3-6 months is recommended for follow-up and if the nodules are stable at the time of repeat and further CT scan is suggested at 18-24 months for low risk patients option of but recommended for high-risk patients. 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 In regards to cholelithiasis, GI has seen her in consultation. Patient needs to be off of Plavix for 5 days prior to ERCP.  Assessment & Plan   Hypotension with weakness  -Hypotension resolved -No evidence of spinal stenosis in the thoracic spine however she does probably have some stenosis in cervical spine  -Troponin level normal -PT evaluated patient, no further therapy needed  Cholelithiasis/Choledocolithiasis  -No sonographic evidence of acute cholecystitis -LFT's improving  -AST 60, ALT 45, ALP 294 and normal bilirubin on 6/26 -Plavix held 5  days (starting 6/23) for ERCP with sphincterotomy and stone extraction on 6/28:  -Acute hepatitis panel negative -Cardiology consulted for clearance, felt patient is able to proceed with surgery -General surgery consulted and appreciated, s/p laparoscopic cholecystectomy  -Currently on heart healthy/carb mod diet, tolerating well  Urinary tract infection / leukocytosis -Leukocytosis resolved -Large leukocytes on admission with many bacteria -Ancef discontinued -Urine culture with multiple species none predominant   Hypothyroidism -Continue Synthroid -TSH within normal limits  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 She was on Bactrim which could have exacerbated kidney function. Bactrim on hold -Creatinine continues to improve, 1.33 -Continue to monitor BMP  Anemia of chronic kidney disease -Hgb 7.3 on 6/24 -Given 1 U PRBC 6/24 -GI mentioned they may do EGD when they do ERCP but will follow up on their recomedations -hemoglobin is stable at 8.7 -Continue to monitor CBC  Hyperkalemia -Given kayexalate as potassium was 6.2, currently 5.2 -Continue to monitor BMP  Severe protein calorie malnutrition -In the context of chronic illness -Seen by dietician   DVT Prophylaxis  SCDs  Code Status: Full  Family Communication: None at bedside  Disposition Plan: Admitted. Pending PT. Possible discharge within 24 hours.  Consultants Gastroenterology  General surgery Cardiology   Procedures  Echocardiogram ERCP  Antibiotics   Anti-infectives    Start     Dose/Rate Route Frequency Ordered Stop   09/03/15 1300  [MAR Hold]  ampicillin-sulbactam (  UNASYN) 1.5 g in sodium chloride  0.9 % 50 mL IVPB     (MAR Hold since 09/03/15 1219)   1.5 g 100 mL/hr over 30 Minutes Intravenous To ShortStay Surgical 09/03/15 0733 09/03/15 1347   08/29/15 0000  ceFAZolin (ANCEF) 500 mg in dextrose 5 % 100 mL IVPB  Status:  Discontinued     500 mg 210 mL/hr over 30 Minutes Intravenous Every 12 hours 08/28/15 2307 09/01/15 1623   08/28/15 2230  sulfamethoxazole-trimethoprim (BACTRIM DS,SEPTRA DS) 800-160 MG per tablet 1 tablet     1 tablet Oral  Once 08/28/15 2215 08/28/15 2232      Subjective:   [name] [name] seen and examined today.  Patient denies nausea, vomiting, abdominal pain.  Complains of muscle spasms and feeling weak. Denies chest pain, shortness of breath.    Objective:   Filed Vitals:   09/05/15 1825 09/05/15 2015 09/05/15 2229 09/06/15 0508  BP: 178/54 157/50 155/52 144/54  Pulse: 70 81 80 69  Temp:  97.7 F (36.5 C)  97.6 F (36.4 C)  TempSrc:  Oral  Oral  Resp: 19 19  19   Height:      Weight:    58.469 kg (128 lb 14.4 oz)  SpO2: 100% 100% 100% 100%    Intake/Output Summary (Last 24 hours) at 09/06/15 1151 Last data filed at 09/05/15 2345  Gross per 24 hour  Intake   1250 ml  Output    475 ml  Net    775 ml   Filed Weights   09/04/15 0613 09/05/15 0529 09/06/15 0508  Weight: 58.559 kg (129 lb 1.6 oz) 58.559 kg (129 lb 1.6 oz) 58.469 kg (128 lb 14.4 oz)    Exam  General: Well developed, well nourished, no distress  HEENT: NCAT, mucous membranes moist.   Cardiovascular: S1 S2 auscultated, RRR  Respiratory: Clear to auscultation   Abdomen: Soft, mildly TTP, nondistended, + bowel sounds, +hernia, RUQ dressing clean and intact  Extremities: warm dry without cyanosis clubbing or edema  Neuro: AAOx3, nonfocal  Psych: Normal affect and demeanor, pleasant   Data Reviewed: I have personally reviewed following labs and imaging studies  CBC:  Recent Labs Lab 09/01/15 0305 09/02/15 0723 09/04/15 0218 09/05/15 0249 09/06/15 0255  WBC 10.1  9.4 9.0 8.2 12.2*  HGB 9.6* 9.5* 9.4* 9.3* 8.7*  HCT 31.5* 31.5* 30.2* 31.2* 29.5*  MCV 81.2 81.8 83.2 86.9 86.5  PLT 354 348 367 388 0000000   Basic Metabolic Panel:  Recent Labs Lab 09/02/15 0723 09/04/15 0218 09/05/15 0249 09/06/15 0255 09/06/15 0725 09/06/15 1017  NA 141 139 137 135 137  --   K 4.3 5.0 5.1 6.2* 6.0* 5.2*  CL 107 109 105 107 104  --   CO2 24 22 25 24 26   --   GLUCOSE 163* 144* 159* 163* 148*  --   BUN 27* 30* 27* 24* 24*  --   CREATININE 1.66* 1.52* 1.36* 1.33* 1.32*  --   CALCIUM 9.3 9.1 9.1 8.8* 9.0  --    GFR: Estimated Creatinine Clearance: 34.5 mL/min (by C-G formula based on Cr of 1.32). Liver Function Tests:  Recent Labs Lab 08/31/15 0259 09/01/15 0305 09/04/15 0218 09/06/15 0255  AST 46* 60* 45* 62*  ALT 61* 45 60* 44  ALKPHOS 308* 294* 222* 175*  BILITOT 0.6 1.1 0.5 0.4  PROT 5.3* 5.7* 5.7* 5.4*  ALBUMIN 1.7* 1.8* 2.0* 2.2*   No results for input(s): LIPASE, AMYLASE in the last 168  hours. No results for input(s): AMMONIA in the last 168 hours. Coagulation Profile: No results for input(s): INR, PROTIME in the last 168 hours. Cardiac Enzymes: No results for input(s): CKTOTAL, CKMB, CKMBINDEX, TROPONINI in the last 168 hours. BNP (last 3 results) No results for input(s): PROBNP in the last 8760 hours. HbA1C: No results for input(s): HGBA1C in the last 72 hours. CBG:  Recent Labs Lab 09/05/15 1656 09/05/15 1840 09/05/15 2018 09/06/15 0621 09/06/15 1110  GLUCAP 170* 186* 201* 147* 146*   Lipid Profile: No results for input(s): CHOL, HDL, LDLCALC, TRIG, CHOLHDL, LDLDIRECT in the last 72 hours. Thyroid Function Tests: No results for input(s): TSH, T4TOTAL, FREET4, T3FREE, THYROIDAB in the last 72 hours. Anemia Panel: No results for input(s): VITAMINB12, FOLATE, FERRITIN, TIBC, IRON, RETICCTPCT in the last 72 hours. Urine analysis:    Component Value Date/Time   COLORURINE YELLOW 08/28/2015 1812   APPEARANCEUR TURBID*  08/28/2015 1812   LABSPEC 1.021 08/28/2015 1812   PHURINE 5.5 08/28/2015 1812   GLUCOSEU NEGATIVE 08/28/2015 1812   GLUCOSEU NEGATIVE 07/03/2015 1352   HGBUR LARGE* 08/28/2015 1812   BILIRUBINUR NEGATIVE 08/28/2015 1812   KETONESUR 15* 08/28/2015 1812   PROTEINUR >300* 08/28/2015 1812   UROBILINOGEN 0.2 07/03/2015 1352   NITRITE NEGATIVE 08/28/2015 1812   LEUKOCYTESUR LARGE* 08/28/2015 1812   Sepsis Labs: @LABRCNTIP (procalcitonin:4,lacticidven:4)  ) Recent Results (from the past 240 hour(s))  Urine culture     Status: Abnormal   Collection Time: 08/28/15  6:31 PM  Result Value Ref Range Status   Specimen Description URINE, RANDOM  Final   Special Requests Immunocompromised  Final   Culture MULTIPLE SPECIES PRESENT, SUGGEST RECOLLECTION (A)  Final   Report Status 08/30/2015 FINAL  Final  MRSA PCR Screening     Status: None   Collection Time: 08/29/15  6:11 AM  Result Value Ref Range Status   MRSA by PCR NEGATIVE NEGATIVE Final    Comment:        The GeneXpert MRSA Assay (FDA approved for NASAL specimens only), is one component of a comprehensive MRSA colonization surveillance program. It is not intended to diagnose MRSA infection nor to guide or monitor treatment for MRSA infections.       Radiology Studies: No results found.   Scheduled Meds: . aspirin  81 mg Oral QHS  . docusate sodium  100 mg Oral BID  . feeding supplement (GLUCERNA SHAKE)  237 mL Oral TID BM  . insulin aspart  0-9 Units Subcutaneous TID WC  . latanoprost  1 drop Both Eyes QHS  . levothyroxine  125 mcg Oral QAC breakfast  . multivitamin with minerals  1 tablet Oral Daily  . pantoprazole  40 mg Oral Daily  . sodium chloride flush  3 mL Intravenous Q12H   Continuous Infusions: . lactated ringers 10 mL/hr at 09/05/15 1833     LOS: 9 days   Time Spent in minutes   30 minutes  Soniya Ashraf D.O. on 09/06/2015 at 11:51 AM  Between 7am to 7pm - Pager - [phone]  After 7pm go to  [email or web address] - password [email or web address]  And look for the night coverage person covering for me after hours  [facility] Office  [phone]

## 2015-09-06 NOTE — Progress Notes (Signed)
1 Day Post-Op  Subjective: Sore tolerating diet    Objective: Vital signs in last 24 hours: Temp:  [97.3 F (36.3 C)-98.3 F (36.8 C)] 97.6 F (36.4 C) (07/01 0508) Pulse Rate:  [68-81] 69 (07/01 0508) Resp:  [19-20] 19 (07/01 0508) BP: (144-178)/(50-62) 144/54 mmHg (07/01 0508) SpO2:  [100 %] 100 % (07/01 0508) Weight:  [58.469 kg (128 lb 14.4 oz)] 58.469 kg (128 lb 14.4 oz) (07/01 0508) Last BM Date: 09/05/15  Intake/Output from previous day: 06/30 0701 - 07/01 0700 In: 1368 [P.O.:118; I.V.:1000; IV Piggyback:250] Out: 475 [Urine:375; Blood:100] Intake/Output this shift:    Incision/Wound:RUQ dressing dry and clean  abdomen sore   Lab Results:   Recent Labs  09/05/15 0249 09/06/15 0255  WBC 8.2 12.2*  HGB 9.3* 8.7*  HCT 31.2* 29.5*  PLT 388 385   BMET  Recent Labs  09/05/15 0249 09/06/15 0255  NA 137 135  K 5.1 6.2*  CL 105 107  CO2 25 24  GLUCOSE 159* 163*  BUN 27* 24*  CREATININE 1.36* 1.33*  CALCIUM 9.1 8.8*   PT/INR No results for input(s): LABPROT, INR in the last 72 hours. ABG No results for input(s): PHART, HCO3 in the last 72 hours.  Invalid input(s): PCO2, PO2  Studies/Results: No results found.  Anti-infectives: Anti-infectives    Start     Dose/Rate Route Frequency Ordered Stop   09/03/15 1300  [MAR Hold]  ampicillin-sulbactam (UNASYN) 1.5 g in sodium chloride 0.9 % 50 mL IVPB     (MAR Hold since 09/03/15 1219)   1.5 g 100 mL/hr over 30 Minutes Intravenous To ShortStay Surgical 09/03/15 0733 09/03/15 1347   08/29/15 0000  ceFAZolin (ANCEF) 500 mg in dextrose 5 % 100 mL IVPB  Status:  Discontinued     500 mg 210 mL/hr over 30 Minutes Intravenous Every 12 hours 08/28/15 2307 09/01/15 1623   08/28/15 2230  sulfamethoxazole-trimethoprim (BACTRIM DS,SEPTRA DS) 800-160 MG per tablet 1 tablet     1 tablet Oral  Once 08/28/15 2215 08/28/15 2232      Assessment/Plan: s/p Procedure(s): ATTEMPTED LAPAROSCOPIC CHOLECYSTECTOMY, EXPLORATORY  LAPAROTOMY WITH CHOLECYSTECTOMY (N/A) Ambulate patient Home once she ambulates and PT clears her  On regular diet now Follow up at [facility] 10 days  Home when medical cleared  Ok to go home from surgery standpoint Sunday   LOS: 9 days    Bethannie Iglehart A. 09/06/2015

## 2015-09-07 LAB — GLUCOSE, CAPILLARY
GLUCOSE-CAPILLARY: 177 mg/dL — AB (ref 65–99)
Glucose-Capillary: 141 mg/dL — ABNORMAL HIGH (ref 65–99)
Glucose-Capillary: 168 mg/dL — ABNORMAL HIGH (ref 65–99)
Glucose-Capillary: 146 mg/dL — ABNORMAL HIGH (ref 65–99)

## 2015-09-07 LAB — CBC
HEMATOCRIT: 28.2 % — AB (ref 36.0–46.0)
HEMOGLOBIN: 8.2 g/dL — AB (ref 12.0–15.0)
MCH: 26 pg (ref 26.0–34.0)
MCHC: 29.1 g/dL — ABNORMAL LOW (ref 30.0–36.0)
MCV: 89.5 fL (ref 78.0–100.0)
Platelets: 353 10*3/uL (ref 150–400)
RBC: 3.15 MIL/uL — AB (ref 3.87–5.11)
RDW: 17.4 % — ABNORMAL HIGH (ref 11.5–15.5)
WBC: 11.1 10*3/uL — AB (ref 4.0–10.5)

## 2015-09-07 LAB — POTASSIUM: Potassium: 4.8 mmol/L (ref 3.5–5.1)

## 2015-09-07 LAB — BASIC METABOLIC PANEL
ANION GAP: 5 (ref 5–15)
BUN: 26 mg/dL — ABNORMAL HIGH (ref 6–20)
CALCIUM: 8.6 mg/dL — AB (ref 8.9–10.3)
CHLORIDE: 103 mmol/L (ref 101–111)
CO2: 27 mmol/L (ref 22–32)
Creatinine, Ser: 1.43 mg/dL — ABNORMAL HIGH (ref 0.44–1.00)
GFR calc Af Amer: 41 mL/min — ABNORMAL LOW (ref >60)
GFR calc non Af Amer: 35 mL/min — ABNORMAL LOW (ref >60)
GLUCOSE: 184 mg/dL — AB (ref 65–99)
POTASSIUM: 4.4 mmol/L (ref 3.5–5.1)
Sodium: 135 mmol/L (ref 135–145)

## 2015-09-07 MED ORDER — HYDROCODONE-ACETAMINOPHEN 5-325 MG PO TABS
1.0000 | ORAL_TABLET | Freq: Four times a day (QID) | ORAL | Status: DC | PRN
  Administered 2015-09-07 – 2015-09-08 (×3): 1 via ORAL
  Filled 2015-09-07 (×4): qty 1

## 2015-09-07 NOTE — Progress Notes (Signed)
PROGRESS NOTE    [name] [name]  [id number] DOB: August 07, 1940 DOA: 08/28/2015 PCP: [name] Snare, MD   Brief Narrative:  75 y.o. Female with past medical history hypertension, diabetes, dyslipidemia, chronic kidney disease stage IV. She presented to [name] office because of inability to walk. She was found to have low blood pressure and liver function enzymes were elevated. She was sent to ER for further evaluation.  In ED, patient was hemodynamically stable. Blood work was notable for leukocytosis of 12.5, hemoglobin 8.1, platelets 405, creatinine 2.48. Abdominal ultrasound showed cholelithiasis without sonographic evidence of acute cholecystitis. She has mild right hydronephrosis with ureteral stent and then severe left hydronephrosis. MRI of thoracic spine did not show spinal stenosis despite chronic T12 compression fracture. 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 She was also found to have 2 right-sided pulmonary nodules 8 mm and 5 mm and CT at 3-6 months is recommended for follow-up and if the nodules are stable at the time of repeat and further CT scan is suggested at 18-24 months for low risk patients option of but recommended for high-risk patients. 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 In regards to cholelithiasis, GI has seen her in consultation. Patient needs to be off of Plavix for 5 days prior to ERCP. S/p lap chole.   Assessment & Plan   Hypotension with weakness  -Hypotension resolved -No evidence of spinal stenosis in the thoracic spine however she does probably have some stenosis in cervical spine  -Troponin level normal -PT evaluated patient 6/29, no further therapy needed -Patient feels she cannot get out of bed since her surgery as she has pain.  Does not feel she is strong enough.  PT reconsulted for evaluation and  treatment.  Cholelithiasis/Choledocolithiasis  -No sonographic evidence of acute cholecystitis -LFT's improving  -AST 60, ALT 45, ALP 294 and normal bilirubin on 6/26 -Plavix held 5 days (starting 6/23) for ERCP with sphincterotomy and stone extraction on 6/28:  -Acute hepatitis panel negative -Cardiology consulted for clearance, felt patient is able to proceed with surgery -General surgery consulted and appreciated, s/p laparoscopic cholecystectomy  -Currently on heart healthy/carb mod diet, tolerating well  Urinary tract infection / leukocytosis -Large leukocytes on admission with many bacteria -Ancef discontinued -Urine culture with multiple species none predominant   Hypothyroidism -Continue Synthroid -TSH within normal limits  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 She was on Bactrim which could have exacerbated kidney function. Bactrim on hold -Creatinine continues to improve, 1.33 -Continue to monitor BMP  Anemia of chronic kidney disease -Hgb 7.3 on 6/24 -Given 1 U PRBC 6/24 -GI mentioned they may do EGD when they do ERCP but will follow up on their recomedations -hemoglobin is stable at 8.2 (continues to drop slightly) -If hemoglobin continues to drop, will transfuse. -Continue to monitor CBC  Hyperkalemia -Resolved, potassium 4.4 today. -Continue to monitor BMP  Severe protein calorie malnutrition -In the context of chronic illness -Seen by dietician   DVT Prophylaxis  SCDs  Code Status: Full  Family Communication: None at bedside  Disposition Plan: Admitted. Pending PT. Possible discharge within 24 hours.  Continue to monitor  hemoglobin  Consultants Gastroenterology  General surgery Cardiology   Procedures  Echocardiogram ERCP Laparoscopic cholecystectomy   Antibiotics   Anti-infectives    Start     Dose/Rate Route Frequency Ordered Stop   09/03/15 1300  [MAR Hold]  ampicillin-sulbactam (UNASYN) 1.5 g in sodium chloride 0.9 % 50 mL IVPB     (MAR Hold since 09/03/15 1219)   1.5 g 100 mL/hr over 30 Minutes Intravenous To ShortStay Surgical 09/03/15 0733 09/03/15 1347   08/29/15 0000  ceFAZolin (ANCEF) 500 mg in dextrose 5 % 100 mL IVPB  Status:  Discontinued     500 mg 210 mL/hr over 30 Minutes Intravenous Every 12 hours 08/28/15 2307 09/01/15 1623   08/28/15 2230  sulfamethoxazole-trimethoprim (BACTRIM DS,SEPTRA DS) 800-160 MG per tablet 1 tablet     1 tablet Oral  Once 08/28/15 2215 08/28/15 2232      Subjective:   [name] [name] seen and examined today.  Patient feels weak and complains of abdominal soreness and pain when she is exerting herself. Pain subsides at rest. She is afraid she is too weak.  Patient denies nausea, vomiting, chest pain, shortness of breath.    Objective:   Filed Vitals:   09/06/15 0508 09/06/15 1443 09/06/15 1939 09/07/15 0620  BP: 144/54 139/44 131/48 164/55  Pulse: 69 65 86 69  Temp: 97.6 F (36.4 C) 97.8 F (36.6 C) 97.9 F (36.6 C) 97.8 F (36.6 C)  TempSrc: Oral  Oral Oral  Resp: 19 18 18 18   Height:      Weight: 58.469 kg (128 lb 14.4 oz)   59.24 kg (130 lb 9.6 oz)  SpO2: 100% 98% 95% 95%    Intake/Output Summary (Last 24 hours) at 09/07/15 1053 Last data filed at 09/07/15 0800  Gross per 24 hour  Intake    480 ml  Output   1250 ml  Net   -770 ml   Filed Weights   09/05/15 0529 09/06/15 0508 09/07/15 0620  Weight: 58.559 kg (129 lb 1.6 oz) 58.469 kg (128 lb 14.4 oz) 59.24 kg (130 lb 9.6 oz)    Exam  General: Well developed, well nourished, no apparent distress  HEENT: NCAT, mucous membranes moist.   Cardiovascular: S1 S2 auscultated,  RRR  Respiratory: Clear to auscultation, no wheezing.  Abdomen: Soft, mildly TTP, nondistended, + bowel sounds, +hernia, RUQ  Extremities: warm dry without cyanosis clubbing or edema  Neuro: AAOx3, nonfocal  Psych: Appropriate mood and affect   Data Reviewed: I have personally reviewed following labs and imaging studies  CBC:  Recent Labs Lab 09/02/15 0723 09/04/15 0218 09/05/15 0249 09/06/15 0255 09/07/15 0230  WBC 9.4 9.0 8.2 12.2* 11.1*  HGB 9.5* 9.4* 9.3* 8.7* 8.2*  HCT 31.5* 30.2* 31.2* 29.5* 28.2*  MCV 81.8 83.2 86.9 86.5 89.5  PLT 348 367 388 385 0000000   Basic Metabolic Panel:  Recent Labs Lab 09/04/15 0218 09/05/15 0249 09/06/15 0255 09/06/15 0725  09/06/15 1457 09/06/15 1915 09/06/15 2246 09/07/15 0230 09/07/15 0658  NA 139 137 135 137  --   --   --   --  135  --   K 5.0 5.1 6.2* 6.0*  < > 5.7* 5.2* 4.7 4.4 4.8  CL 109 105 107 104  --   --   --   --  103  --   CO2 22 25 24 26   --   --   --   --  27  --  GLUCOSE 144* 159* 163* 148*  --   --   --   --  184*  --   BUN 30* 27* 24* 24*  --   --   --   --  26*  --   CREATININE 1.52* 1.36* 1.33* 1.32*  --   --   --   --  1.43*  --   CALCIUM 9.1 9.1 8.8* 9.0  --   --   --   --  8.6*  --   < > = values in this interval not displayed. GFR: Estimated Creatinine Clearance: 32.3 mL/min (by C-G formula based on Cr of 1.43). Liver Function Tests:  Recent Labs Lab 09/01/15 0305 09/04/15 0218 09/06/15 0255  AST 60* 45* 62*  ALT 45 60* 44  ALKPHOS 294* 222* 175*  BILITOT 1.1 0.5 0.4  PROT 5.7* 5.7* 5.4*  ALBUMIN 1.8* 2.0* 2.2*   No results for input(s): LIPASE, AMYLASE in the last 168 hours. No results for input(s): AMMONIA in the last 168 hours. Coagulation Profile: No results for input(s): INR, PROTIME in the last 168 hours. Cardiac Enzymes: No results for input(s): CKTOTAL, CKMB, CKMBINDEX, TROPONINI in the last 168 hours. BNP (last 3 results) No results for input(s): PROBNP in the last 8760  hours. HbA1C: No results for input(s): HGBA1C in the last 72 hours. CBG:  Recent Labs Lab 09/06/15 0621 09/06/15 1110 09/06/15 1618 09/06/15 2129 09/07/15 0619  GLUCAP 147* 146* 157* 151* 141*   Lipid Profile: No results for input(s): CHOL, HDL, LDLCALC, TRIG, CHOLHDL, LDLDIRECT in the last 72 hours. Thyroid Function Tests: No results for input(s): TSH, T4TOTAL, FREET4, T3FREE, THYROIDAB in the last 72 hours. Anemia Panel: No results for input(s): VITAMINB12, FOLATE, FERRITIN, TIBC, IRON, RETICCTPCT in the last 72 hours. Urine analysis:    Component Value Date/Time   COLORURINE YELLOW 08/28/2015 1812   APPEARANCEUR TURBID* 08/28/2015 1812   LABSPEC 1.021 08/28/2015 1812   PHURINE 5.5 08/28/2015 1812   GLUCOSEU NEGATIVE 08/28/2015 1812   GLUCOSEU NEGATIVE 07/03/2015 1352   HGBUR LARGE* 08/28/2015 1812   BILIRUBINUR NEGATIVE 08/28/2015 1812   KETONESUR 15* 08/28/2015 1812   PROTEINUR >300* 08/28/2015 1812   UROBILINOGEN 0.2 07/03/2015 1352   NITRITE NEGATIVE 08/28/2015 1812   LEUKOCYTESUR LARGE* 08/28/2015 1812   Sepsis Labs: @LABRCNTIP (procalcitonin:4,lacticidven:4)  ) Recent Results (from the past 240 hour(s))  Urine culture     Status: Abnormal   Collection Time: 08/28/15  6:31 PM  Result Value Ref Range Status   Specimen Description URINE, RANDOM  Final   Special Requests Immunocompromised  Final   Culture MULTIPLE SPECIES PRESENT, SUGGEST RECOLLECTION (A)  Final   Report Status 08/30/2015 FINAL  Final  MRSA PCR Screening     Status: None   Collection Time: 08/29/15  6:11 AM  Result Value Ref Range Status   MRSA by PCR NEGATIVE NEGATIVE Final    Comment:        The GeneXpert MRSA Assay (FDA approved for NASAL specimens only), is one component of a comprehensive MRSA colonization surveillance program. It is not intended to diagnose MRSA infection nor to guide or monitor treatment for MRSA infections.       Radiology Studies: No results  found.   Scheduled Meds: . aspirin  81 mg Oral QHS  . docusate sodium  100 mg Oral BID  . feeding supplement (GLUCERNA SHAKE)  237 mL Oral TID BM  . insulin aspart  0-9 Units Subcutaneous TID WC  . latanoprost  1 drop Both Eyes QHS  . levothyroxine  125 mcg Oral QAC breakfast  . multivitamin with minerals  1 tablet Oral Daily  . pantoprazole  40 mg Oral Daily  . sodium chloride flush  3 mL Intravenous Q12H   Continuous Infusions: . lactated ringers Stopped (09/07/15 0634)     LOS: 10 days   Time Spent in minutes   30 minutes  Hala Narula D.O. on 09/07/2015 at 10:53 AM  Between 7am to 7pm - Pager - [phone]  After 7pm go to [email or web address] - password [email or web address]  And look for the night coverage person covering for me after hours  [facility] Office  [phone]

## 2015-09-07 NOTE — Progress Notes (Addendum)
Physical Therapy Treatment Patient Details Name: [name] [name] MRN: [id number] DOB: 11-03-1940 Today's Date: 2015/09/11    History of Present Illness Pt adm with hypotension, weakness, and Cholelithiasis. PMH - PVD, AAA, CKD, CAD, DM    PT Comments    Patient seen for reassessment of mobility post procedure. Patient ambulated increased distance without complication using 4 wheeled walker. Patient able to perform self care and functional tasks without assist. At this time, anticipate patient will be safe for d/c home.  Will recommend HHPT to address concerns [id number], but overall anticipate patient will do well. Recommend intermittent supervision.   Follow Up Recommendations  Home health PT;Supervision - Intermittent     Equipment Recommendations  None recommended by PT    Recommendations for Other Services       Precautions / Restrictions Precautions Precautions: Fall Restrictions Weight Bearing Restrictions: No    Mobility  Bed Mobility Overal bed mobility: Modified Independent                Transfers Overall transfer level: Needs assistance Equipment used: 4-wheeled walker Transfers: Sit to/from Stand Sit to Stand: Supervision            Ambulation/Gait Ambulation/Gait assistance: Supervision Ambulation Distance (Feet): 300 Feet Assistive device: 4-wheeled walker Gait Pattern/deviations: Step-through pattern;Decreased stride length   Gait velocity interpretation: at or above normal speed for age/gender General Gait Details: no difficulties with gait, steady with use of rollator   Stairs            Wheelchair Mobility    Modified Rankin (Stroke Patients Only)       Balance     Sitting balance-Leahy Scale: Good     Standing balance support: No upper extremity supported Standing balance-Leahy Scale: Fair Standing balance comment: able to perform self care functional tasks without assist                    Cognition  Arousal/Alertness: Awake/alert Behavior During Therapy: WFL for tasks assessed/performed Overall Cognitive Status: Within Functional Limits for tasks assessed                      Exercises      General Comments        Pertinent Vitals/Pain Pain Assessment: 0-10 Pain Score: 3  Pain Location: abdominal Pain Descriptors / Indicators: Sore    Home Living                      Prior Function            PT Goals (current goals can now be found in the care plan section) Acute Rehab PT Goals Patient Stated Goal: return home PT Goal Formulation: With patient Time For Goal Achievement: 09/09/15 Potential to Achieve Goals: Good Progress towards PT goals: Progressing toward goals    Frequency  Min 3X/week    PT Plan Current plan remains appropriate    Co-evaluation             End of Session Equipment Utilized During Treatment: Gait belt Activity Tolerance: Patient tolerated treatment well Patient left: in chair;with call bell/phone within reach     Time: 1125-1142 PT Time Calculation (min) (ACUTE ONLY): 17 min  Charges:  $Gait Training: 8-22 mins                    G CodesDuncan Dull 09/11/15, 11:48 AM Alben Deeds, PT  DPT  [phone]

## 2015-09-07 NOTE — Progress Notes (Signed)
2 Days Post-Op  Subjective: Eating, sore  Objective: Vital signs in last 24 hours: Temp:  [97.8 F (36.6 C)-97.9 F (36.6 C)] 97.8 F (36.6 C) (07/02 0620) Pulse Rate:  [65-86] 69 (07/02 0620) Resp:  [18] 18 (07/02 0620) BP: (131-164)/(44-55) 164/55 mmHg (07/02 0620) SpO2:  [95 %-98 %] 95 % (07/02 0620) Weight:  [59.24 kg (130 lb 9.6 oz)] 59.24 kg (130 lb 9.6 oz) (07/02 0620) Last BM Date: 09/05/15  Intake/Output from previous day: 07/01 0701 - 07/02 0700 In: 480 [P.O.:480] Out: 1250 [Urine:1250] Intake/Output this shift:    General appearance: alert and cooperative GI: soft, expected tenderness, incisions CDI, dressings left off  Lab Results:   Recent Labs  09/06/15 0255 09/07/15 0230  WBC 12.2* 11.1*  HGB 8.7* 8.2*  HCT 29.5* 28.2*  PLT 385 353   BMET  Recent Labs  09/06/15 0725  09/07/15 0230 09/07/15 0658  NA 137  --  135  --   K 6.0*  < > 4.4 4.8  CL 104  --  103  --   CO2 26  --  27  --   GLUCOSE 148*  --  184*  --   BUN 24*  --  26*  --   CREATININE 1.32*  --  1.43*  --   CALCIUM 9.0  --  8.6*  --   < > = values in this interval not displayed. PT/INR No results for input(s): LABPROT, INR in the last 72 hours. ABG No results for input(s): PHART, HCO3 in the last 72 hours.  Invalid input(s): PCO2, PO2  Studies/Results: No results found.  Anti-infectives: Anti-infectives    Start     Dose/Rate Route Frequency Ordered Stop   09/03/15 1300  [MAR Hold]  ampicillin-sulbactam (UNASYN) 1.5 g in sodium chloride 0.9 % 50 mL IVPB     (MAR Hold since 09/03/15 1219)   1.5 g 100 mL/hr over 30 Minutes Intravenous To ShortStay Surgical 09/03/15 0733 09/03/15 1347   08/29/15 0000  ceFAZolin (ANCEF) 500 mg in dextrose 5 % 100 mL IVPB  Status:  Discontinued     500 mg 210 mL/hr over 30 Minutes Intravenous Every 12 hours 08/28/15 2307 09/01/15 1623   08/28/15 2230  sulfamethoxazole-trimethoprim (BACTRIM DS,SEPTRA DS) 800-160 MG per tablet 1 tablet     1  tablet Oral  Once 08/28/15 2215 08/28/15 2232      Assessment/Plan: s/p Procedure(s): ATTEMPTED LAPAROSCOPIC CHOLECYSTECTOMY, EXPLORATORY LAPAROTOMY WITH CHOLECYSTECTOMY (N/A) S/P lap converted to open cholecystectomy POD#2 Doing well PT/OT I D/W Dr. Ree Kida at the bedside  LOS: 10 days    [name] [name] 09/07/2015

## 2015-09-08 LAB — COMPREHENSIVE METABOLIC PANEL
ALBUMIN: 2.1 g/dL — AB (ref 3.5–5.0)
ALK PHOS: 145 U/L — AB (ref 38–126)
ALT: 17 U/L (ref 14–54)
AST: 23 U/L (ref 15–41)
Anion gap: 16 — ABNORMAL HIGH (ref 5–15)
BUN: 26 mg/dL — ABNORMAL HIGH (ref 6–20)
CALCIUM: 10.2 mg/dL (ref 8.9–10.3)
CO2: 27 mmol/L (ref 22–32)
CREATININE: 1.36 mg/dL — AB (ref 0.44–1.00)
Chloride: 98 mmol/L — ABNORMAL LOW (ref 101–111)
GFR calc Af Amer: 43 mL/min — ABNORMAL LOW (ref >60)
GFR calc non Af Amer: 37 mL/min — ABNORMAL LOW (ref >60)
GLUCOSE: 162 mg/dL — AB (ref 65–99)
Potassium: 5.1 mmol/L (ref 3.5–5.1)
SODIUM: 141 mmol/L (ref 135–145)
Total Bilirubin: 0.6 mg/dL (ref 0.3–1.2)
Total Protein: 5.2 g/dL — ABNORMAL LOW (ref 6.5–8.1)

## 2015-09-08 LAB — GLUCOSE, CAPILLARY
GLUCOSE-CAPILLARY: 125 mg/dL — AB (ref 65–99)
Glucose-Capillary: 139 mg/dL — ABNORMAL HIGH (ref 65–99)
Glucose-Capillary: 206 mg/dL — ABNORMAL HIGH (ref 65–99)

## 2015-09-08 LAB — CBC
HCT: 28.6 % — ABNORMAL LOW (ref 36.0–46.0)
HEMOGLOBIN: 8.5 g/dL — AB (ref 12.0–15.0)
MCH: 26.2 pg (ref 26.0–34.0)
MCHC: 29.7 g/dL — ABNORMAL LOW (ref 30.0–36.0)
MCV: 88.3 fL (ref 78.0–100.0)
Platelets: 362 10*3/uL (ref 150–400)
RBC: 3.24 MIL/uL — AB (ref 3.87–5.11)
RDW: 17.3 % — ABNORMAL HIGH (ref 11.5–15.5)
WBC: 10.2 10*3/uL (ref 4.0–10.5)

## 2015-09-08 MED ORDER — GLUCERNA SHAKE PO LIQD: 237.0000 mL | Freq: Three times a day (TID) | ORAL | Status: DC

## 2015-09-08 MED ORDER — POLYETHYLENE GLYCOL 3350 17 G PO PACK: 17.0000 g | PACK | Freq: Every day | ORAL | Status: DC | PRN

## 2015-09-08 NOTE — Progress Notes (Signed)
Spoke w pt. She has hx of [facility] w [facility]. She liked them and will use them again. Going to [employer] [facility] in [place] for week [phone] then back to her home. Ref to [facility] for hhpt. Poss dc today.

## 2015-09-08 NOTE — Progress Notes (Signed)
[facility] Progress Note  3 Days Post-Op  Subjective: Abdominal pain improving. Worse with movement. Ambulating with PT. Tolerating diet without N/V. +flatus. No BM.  Objective: Vital signs in last 24 hours: Temp:  [97.4 F (36.3 C)-97.9 F (36.6 C)] 97.4 F (36.3 C) (07/03 0612) Pulse Rate:  [68-72] 69 (07/03 0612) Resp:  [16-20] 16 (07/03 0612) BP: (145-160)/(50-64) 160/64 mmHg (07/03 0612) SpO2:  [96 %-100 %] 96 % (07/03 0612) Weight:  [58.786 kg (129 lb 9.6 oz)] 58.786 kg (129 lb 9.6 oz) (07/03 0612) Last BM Date: 09/05/15  Intake/Output from previous day: 07/02 0701 - 07/03 0700 In: 720 [P.O.:720] Out: 1950 [Urine:1950] Intake/Output this shift: Total I/O In: 240 [P.O.:240] Out: 300 [Urine:300]  PE: Gen:  Alert, NAD, pleasant Pulm:  CTA, no W/R/R Abd: Soft, appropriately tender, ND, +BS, staples/incisions c/d/i without erythema or discharge  Lab Results:   Recent Labs  09/07/15 0230 09/08/15 0220  WBC 11.1* 10.2  HGB 8.2* 8.5*  HCT 28.2* 28.6*  PLT 353 362   BMET  Recent Labs  09/07/15 0230 09/07/15 0658 09/08/15 0220  NA 135  --  141  K 4.4 4.8 5.1  CL 103  --  98*  CO2 27  --  27  GLUCOSE 184*  --  162*  BUN 26*  --  26*  CREATININE 1.43*  --  1.36*  CALCIUM 8.6*  --  10.2   PT/INR No results for input(s): LABPROT, INR in the last 72 hours. CMP     Component Value Date/Time   NA 141 09/08/2015 0220   K 5.1 09/08/2015 0220   CL 98* 09/08/2015 0220   CO2 27 09/08/2015 0220   GLUCOSE 162* 09/08/2015 0220   BUN 26* 09/08/2015 0220   CREATININE 1.36* 09/08/2015 0220   CREATININE 1.35* 01/04/2013 0915   CALCIUM 10.2 09/08/2015 0220   PROT 5.2* 09/08/2015 0220   ALBUMIN 2.1* 09/08/2015 0220   AST 23 09/08/2015 0220   ALT 17 09/08/2015 0220   ALKPHOS 145* 09/08/2015 0220   BILITOT 0.6 09/08/2015 0220   GFRNONAA 37* 09/08/2015 0220   GFRAA 43* 09/08/2015 0220   Lipase     Component Value Date/Time   LIPASE 25  12/04/2009 1820   Studies/Results: No results found.  Anti-infectives: Anti-infectives    Start     Dose/Rate Route Frequency Ordered Stop   09/03/15 1300  [MAR Hold]  ampicillin-sulbactam (UNASYN) 1.5 g in sodium chloride 0.9 % 50 mL IVPB     (MAR Hold since 09/03/15 1219)   1.5 g 100 mL/hr over 30 Minutes Intravenous To ShortStay Surgical 09/03/15 0733 09/03/15 1347   08/29/15 0000  ceFAZolin (ANCEF) 500 mg in dextrose 5 % 100 mL IVPB  Status:  Discontinued     500 mg 210 mL/hr over 30 Minutes Intravenous Every 12 hours 08/28/15 2307 09/01/15 1623   08/28/15 2230  sulfamethoxazole-trimethoprim (BACTRIM DS,SEPTRA DS) 800-160 MG per tablet 1 tablet     1 tablet Oral  Once 08/28/15 2215 08/28/15 2232     Assessment/Plan S/p laparoscopic converted to open cholecystectomy, POD#3 - tolerating diet - PT/OT  - Dispo: cleared for discharge home with [facility] PT. will need follow-up and staple removal with [facility] (appointment scheduled for 7/19 and 10:15 am)   LOS: 11 days    Jill Alexanders , [facility] 09/08/2015, 10:08 AM Pager: [phone] Mon-Fri 7:00 am-4:30 pm Sat-Sun 7:00 am-11:30 am

## 2015-09-08 NOTE — Progress Notes (Signed)
Physical Therapy Treatment Patient Details Name: KENNETTE HERRA MRN: [id number] DOB: 1941-03-07 Today's Date: 09/08/2015    History of Present Illness Pt adm with hypotension, weakness, and Cholelithiasis. PMH - PVD, AAA, CKD, CAD, DM    PT Comments    Pt progressing well. Pt to continue with HHPT upon d/c.  Follow Up Recommendations  Home health PT;Supervision - Intermittent     Equipment Recommendations  None recommended by PT    Recommendations for Other Services       Precautions / Restrictions Precautions Precautions: Fall Restrictions Weight Bearing Restrictions: No    Mobility  Bed Mobility Overal bed mobility: Modified Independent                Transfers   Equipment used: Rolling walker (2 wheeled)   Sit to Stand: Supervision         General transfer comment: No physical assist needed. Pt demo safe technique.  Ambulation/Gait Ambulation/Gait assistance: Supervision Ambulation Distance (Feet): 250 Feet Assistive device: Rolling walker (2 wheeled) Gait Pattern/deviations: Step-through pattern;Decreased stride length;Trunk flexed Gait velocity: mildly decreased   General Gait Details: steady gait   Stairs            Wheelchair Mobility    Modified Rankin (Stroke Patients Only)       Balance     Sitting balance-Leahy Scale: Good       Standing balance-Leahy Scale: Fair                      Cognition Arousal/Alertness: Awake/alert Behavior During Therapy: WFL for tasks assessed/performed Overall Cognitive Status: Within Functional Limits for tasks assessed                      Exercises      General Comments        Pertinent Vitals/Pain Pain Assessment: 0-10 Pain Score: 3  Pain Location: Abdomen during mobility Pain Descriptors / Indicators: Sore Pain Intervention(s): Monitored during session;Repositioned    Home Living                      Prior Function            PT Goals  (current goals can now be found in the care plan section) Acute Rehab PT Goals Patient Stated Goal: return home PT Goal Formulation: With patient Time For Goal Achievement: 09/09/15 Potential to Achieve Goals: Good Progress towards PT goals: Progressing toward goals    Frequency  Min 3X/week    PT Plan Current plan remains appropriate    Co-evaluation             End of Session Equipment Utilized During Treatment: Gait belt Activity Tolerance: Patient tolerated treatment well Patient left: in chair;with call bell/phone within reach     Time: 1004-1017 PT Time Calculation (min) (ACUTE ONLY): 13 min  Charges:  $Gait Training: 8-22 mins                    G Codes:      Lorriane Shire 09/08/2015, 11:23 AM

## 2015-09-08 NOTE — Care Management Important Message (Signed)
Important Message  Patient Details  Name: [name] [name] MRN: [id number] Date of Birth: 04-Dec-1940   Medicare Important Message Given:  Yes    Nathen May 09/08/2015, 10:44 AM

## 2015-09-08 NOTE — Discharge Summary (Signed)
Physician Discharge Summary  [name] [name] [id number] DOB: 1940-07-11 DOA: 08/28/2015  PCP: [name], MD  Admit date: 08/28/2015 Discharge date: 09/08/2015  Time spent: 45 minutes  Recommendations for Outpatient Follow-up:  Patient will be discharged to home with home health PT.  Patient will need to follow up with primary care provider within one week of discharge, repeat CBC and BMP.  Follow up with surgery in 7-10 days.  Patient should continue medications as prescribed.  Patient should follow a heart healthy/carb modified diet.   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 Sonia Side [name] is a 75 y.o. female, w/ Dm2, CAD, AAA, CHF (EF 50-55%), CKD stage 4,  Diverticulosis apparently was at Dr. Ronnie Derby office and could not walk and her bp was low and her liver function was abnormal along with her hemoglobin. Pt was sent to ER for evaluation of weakness in her bilateral legs , as well as numbness " like my legs went to sleep" Pt states numbness has disappeared. Pt has chronic back pain. Pt denies headache, vision change, dysarthria, fever, chills, n/v, abd pain, diarrhea, brbpr. Pt notes n/v x3 5 days ago.   In ED, pt found to have anemia, hgb=8.6 and mild acute on chronic renal failure. Pt also had marked liver abnormality, and choledocholithiasis  on MRI. Pt will be admitted for hypotension, choledocholithiasis and abnormal liver function, and acute on chronic renal failure.   Hospital Course:  Hypotension with weakness  -Hypotension resolved -No evidence of spinal stenosis in the thoracic spine however she does probably have some stenosis in cervical spine  -Troponin level normal -PT evaluated patient 6/29, no further therapy needed -Patient feels she cannot get out of bed since her surgery as she has pain. Does not feel she is strong enough. PT reconsulted for evaluation and treatment= recommended home health.  Cholelithiasis/Choledocolithiasis  -No sonographic evidence of acute cholecystitis -LFT's improving  -AST 23, ALT 17  -Plavix held 5 days (starting 6/23) for ERCP with sphincterotomy and stone extraction on 6/28 -Acute hepatitis panel negative -Cardiology consulted for clearance, felt patient is able to proceed with surgery -General surgery consulted and appreciated, s/p laparoscopic cholecystectomy  -Currently on heart healthy/carb mod diet, tolerating well  Urinary tract infection / leukocytosis -Large leukocytes on admission with many bacteria -Ancef discontinued -Urine culture with multiple species none predominant  -Leukocytosis resovled  Hypothyroidism -Continue Synthroid -TSH within normal limits  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 She was on Bactrim which could have exacerbated kidney function. Bactrim on hold -Creatinine continues to improve, 1.36 -Continue to monitor BMP  Anemia of chronic kidney disease -Hgb 7.3 on 6/24 -Given 1 U PRBC  6/24 -GI mentioned they may do  EGD when they do ERCP but will follow up on their recomedations -hemoglobin is stable at 8.5  -If hemoglobin continues to drop, will transfuse. -Continue to monitor CBC  Hyperkalemia -Resolved, potassium 5.1 today. -Continue to monitor BMP  Severe protein calorie malnutrition -In the context of chronic illness -Seen by dietician   Consultants Gastroenterology  General surgery Cardiology   Procedures  Echocardiogram ERCP Laparoscopic cholecystectomy   Discharge Exam: Filed Vitals:   09/07/15 2001 09/08/15 0612  BP: 147/58 160/64  Pulse: 72 69  Temp: 97.9 F (36.6 C) 97.4 F (36.3 C)  Resp: 20 16   Exam  General: Well developed, well nourished, no apparent distress  HEENT: NCAT, mucous membranes moist.   Cardiovascular: S1 S2 auscultated, RRR, no murmurs  Respiratory: Clear to auscultation with equal chest rise, no wheezing  Abdomen: Soft, mildly TTP, nondistended, + bowel sounds, +hernia  Extremities: warm dry without cyanosis clubbing or edema  Neuro: AAOx3, nonfocal  Psych: Appropriate mood and affect, pleasant  Discharge Instructions      Discharge Instructions    Discharge instructions    Complete by:  As directed   Patient will be discharged to home with home health PT.  Patient will need to follow up with primary care provider within one week of discharge, repeat CBC and BMP.  Follow up with surgery in 7-10 days.  Patient should continue medications as prescribed.  Patient should follow a heart healthy/carb modified diet.            Medication List    TAKE these medications        albuterol 108 (90 Base) MCG/ACT inhaler  Commonly known as:  PROVENTIL HFA;VENTOLIN HFA  Inhale 2 puffs into the lungs every 6 (six) hours as needed for wheezing.     aspirin 81 MG chewable tablet  Chew 81 mg by mouth at bedtime.     calcium-vitamin D 500-200 MG-UNIT tablet  Commonly known as:  OSCAL WITH D  Take 1 tablet by mouth every morning.      clopidogrel 75 MG tablet  Commonly known as:  PLAVIX  TAKE 1 TABLET EVERY MORNING     Cranberry 250 MG Tabs  Take 1 tablet by mouth daily.     CRESTOR 20 MG tablet  Generic drug:  rosuvastatin  take 1 tablet by mouth once daily     feeding supplement (GLUCERNA SHAKE) Liqd  Take 237 mLs by mouth 3 (three) times daily between meals.     ferrous sulfate 325 (65 FE) MG tablet  Take 325 mg by mouth 3 (three) times a week.     furosemide 20 MG tablet  Commonly known as:  LASIX  take 1 tablet by mouth once daily     HYDROcodone-acetaminophen 5-325 MG tablet  Commonly known as:  NORCO/VICODIN  Take 1 tablet every 6 hours as needed for back pain     insulin aspart 100 UNIT/ML FlexPen  Commonly known as:  NOVOLOG  Inject 8 units three times a day with meals     Insulin Detemir 100 UNIT/ML Pen  Commonly known as:  LEVEMIR FLEXTOUCH  Inject 22 units at bedtime.     lactobacillus acidophilus Tabs tablet  Take 2 tablets by mouth 3 (three) times daily.     latanoprost 0.005 % ophthalmic solution  Commonly known as:  XALATAN  Place 1 drop into both eyes at bedtime.     levothyroxine 125 MCG tablet  Commonly known as:  SYNTHROID, LEVOTHROID  TAKE 1 TABLET DAILY     metoCLOPramide 5 MG tablet  Commonly known as:  REGLAN  Take 1 tablet (5 mg total) by mouth 4 (four) times daily.     metoprolol succinate 25 MG 24 hr tablet  Commonly known as:  TOPROL-XL  TAKE 1/2 TABLET (=12.5MG )  EVERY MORNING     omeprazole 20 MG capsule  Commonly known as:  PRILOSEC  TAKE 1 CAPSULE DAILY     phenazopyridine 200 MG tablet  Commonly known as:  PYRIDIUM  Take 1 tablet (200 mg total) by mouth 3 (three) times daily as needed for pain.     polyethylene glycol packet  Commonly known as:  MIRALAX / GLYCOLAX  Take 17 g by mouth daily as needed for moderate constipation.     vitamin C 500 MG tablet  Commonly known as:  ASCORBIC ACID  Take 500 mg by mouth every morning.     Vitamin D  (Ergocalciferol) 50000 units Caps capsule  Commonly known as:  DRISDOL  TAKE 1 CAPSULE BY MOUTH EVERY 7 DAYS ON THURSDAYS       No Known Allergies Follow-up Information    Follow up with Jerene Bears, MD On 11/12/2015.   Specialty:  Gastroenterology   Why:  9:45 follow up with GI MD.    Contact information:   [address] [place] [address] [phone]       Follow up with [facility] On 09/24/2015.   Specialty:  General Surgery   Why:  Your appointment is at 10:15, be at the office 30 minutes Canner for check in.   Contact information:   [address] [place] [address] [phone]       Follow up with Quay Burow, MD. Go on 10/08/2015.   Specialties:  Cardiology, Radiology   Why:  @10am  for post hospital   Contact information:   [address] [place] [address] [phone]       Follow up with [facility], MD. Schedule an appointment as soon as possible for a visit in 1 week.   Specialty:  Endocrinology   Why:  Hospital follow up   Contact information:   [place]  [id number] [phone]        The results of significant diagnostics from this hospitalization (including imaging, microbiology, ancillary and laboratory) are listed below for reference.    Significant Diagnostic Studies: Ct Chest Wo Contrast  08/29/2015  CLINICAL DATA:  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 Further evaluation requested. Initial encounter. EXAM: CT CHEST WITHOUT CONTRAST TECHNIQUE: Multidetector CT imaging of the chest was performed following the standard protocol without IV contrast. COMPARISON:  Chest radiograph from 04/06/2012, CT of the abdomen and pelvis from 03/08/2012, and MRI of the thoracic spine performed earlier today at 7:27 p.m. 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 This progresses to the level of the proximal abdominal aorta, where the aorta measures 3.8 cm at the diaphragm. It is normal in caliber starting at the level of the superior mesenteric artery. The ascending thoracic aorta and aortic arch are grossly unremarkable, aside from scattered calcification along the thoracic abdominal aorta. Calcification is noted at the origin of the  superior mesenteric artery. Diffuse coronary artery calcifications are seen. Mediastinum: The mediastinum is otherwise unremarkable in appearance. No mediastinal lymphadenopathy is seen. No pericardial effusion is identified. Scattered calcification is noted along the proximal great vessels. The patient is status post median sternotomy. The visualized portions of thyroid gland are grossly unremarkable. No axillary lymphadenopathy is seen. Lungs/Pleura: Prominent high density at the left lung base reflects prior talc pleurodesis, with left-sided extrapleural fat again noted. Mild associated scarring is seen. Trace right-sided pleural fluid is noted. A 8 mm nodule is noted at the right lower lobe (image 96 of 164), while a 5 mm pleural-based nodule is noted at the right upper lobe (image 40 of 164). The right lung is otherwise clear. Upper Abdomen: The visualized portions of the liver and spleen are grossly unremarkable. Stones are noted dependently within the gallbladder. Postoperative change is noted. There is severe chronic left-sided hydronephrosis, with severe chronic left renal atrophy. Minimal right-sided renal atrophy is suggested, new from the prior study. Musculoskeletal: There is chronic compression deformity involving vertebral body T12. Degenerative change is noted at the lower cervical spine, with chronic osseous fusion noted at C7-T1. IMPRESSION: 1. 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 The aorta measures 3.8 cm at the diaphragm. It is normal in caliber beginning at the level of the superior mesenteric artery. Recommend annual imaging followup by MRA or CTA, as deemed clinically appropriate. 2. Diffuse coronary artery calcifications seen. 3. Scattered calcification along the thoracic and proximal abdominal aorta, with calcification at the origin of the superior mesenteric artery. 4. Prominent high density at the left lung base reflects prior talc pleurodesis, with mild associated scarring. 5. Trace right-sided pleural fluid noted. 6. Two right-sided pulmonary nodules noted, measuring 8 mm and 5 mm. Non-contrast chest CT at 3-6 months is recommended. If the nodules are stable at time of repeat CT, then future CT at 18-24 months (from today's scan) is considered optional for low-risk patients, but is recommended for high-risk patients. This recommendation follows the consensus statement: Guidelines for Management of Incidental Pulmonary Nodules Detected on CT Images:From the Fleischner Society 2017; published online before print (10.1148/radiol.IJ:2314499). 7. Cholelithiasis.  Gallbladder otherwise unremarkable. 8. Severe chronic left-sided hydronephrosis, with severe chronic left renal atrophy. Minimal right renal atrophy suggested, new from the prior study. 9. Chronic compression deformity involving vertebral body T12. Electronically Signed   By: Garald Balding M.D.   On: 08/29/2015 00:46   Mr Brain Wo Contrast  08/29/2015  CLINICAL DATA:  Initial evaluation for acute weakness. EXAM: MRI HEAD WITHOUT CONTRAST TECHNIQUE: Multiplanar, multiecho pulse sequences of the brain and surrounding structures were obtained without intravenous contrast. COMPARISON:  Prior CT from 06/07/2012 as well as arteriogram from 12/02/2004. FINDINGS: Mild diffuse prominence of the CSF containing spaces is compatible with generalized age-related  cerebral atrophy. Patchy T2/FLAIR hyperintensity within the periventricular and deep white matter both cerebral hemispheres most consistent with chronic small vessel ischemic disease. Few scattered superimposed remote lacunar infarctions present. Probable small remote lacunar infarct within the left thalamus. Small remote infarcts within the left cerebellar hemisphere. Encephalomalacia within the left parietal and occipital lobes also consistent with remote infarcts. No abnormal foci of restricted diffusion to suggest acute infarct. Gray-white matter differentiation maintained. There is an abnormal flow void within the right internal carotid artery, which may related to slow flow and/ or occlusion. A severe stenosis was present within the  right internal carotid artery at site of prior endarterectomy on prior arteriogram from 2006. Abnormal flow void within the left vertebral artery is well, which may be related to slow flow and/or occlusion. Few scattered punctate foci susceptibility artifact present within the supratentorial and infratentorial brain, likely small chronic micro hemorrhages, most likely related a chronic underlying hypertension. No mass lesion, midline shift, or mass effect. No hydrocephalus. No extra-axial fluid collection. Major dural sinuses are patent. Craniocervical junction within normal limits. Visualized upper cervical spine without acute abnormality para Pituitary gland within normal limits. No acute abnormality about the globes and orbits. Patient is status post bilateral lens extraction. Paranasal sinuses are clear. Trace opacity within left mastoid air cells. Inner ear structures normal. Bone marrow signal intensity within normal limits. No scalp soft tissue abnormality. IMPRESSION: 1. No acute intracranial infarct or other process identified. 2. 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 Additional scattered remote  small vessel lacunar infarcts. 3. Abnormal flow voids within the right ICA and distal left vertebral artery, which may be related to slow flow and/or occlusion. 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 Patchy slow flow was also present within the left vertebral artery at that time as well. 4. Age-related cerebral atrophy with moderate chronic small vessel ischemic disease. Electronically Signed   By: Jeannine Boga M.D.   On: 08/29/2015 01:31   Mr Thoracic Spine Wo Contrast  08/28/2015  CLINICAL DATA:  75 year old female with 3 weeks of bilateral lower extremity weakness. Ataxia. Unintentional weight loss for 2 months. Anemia. Initial encounter. EXAM: MRI THORACIC SPINE WITHOUT CONTRAST TECHNIQUE: Multiplanar, multisequence MR imaging of the thoracic spine was performed. No intravenous contrast was administered. COMPARISON:  Chest radiographs 04/06/2013. Lumbar MRI from today reported separately. CT Abdomen and Pelvis 03/08/2012. FINDINGS: Limited sagittal imaging of the cervical spine suggests widespread advanced cervical spine disc and endplate degeneration (series 3, image 7). There is evidence of associated spinal stenosis, perhaps maximal at C3-C4. There is severe disc space loss at C7-T1 associated with mild spondylolisthesis and facet hypertrophy. No significant spinal stenosis results at this level. There is moderate to severe left C8 foraminal stenosis. There is a chronic mild to moderate T12 compression fracture. Elsewhere thoracic vertebral height and alignment is preserved. No marrow edema or evidence of acute osseous abnormality. The thoracic spinal canal is capacious. There are occasional small thoracic disc protrusions. At T11-T12 there is disc osteophyte complex - in part related to the mild T12 posterior superior endplate retropulsion - in addition to mild posterior element hypertrophy, but no  significant thoracic spinal stenosis results. Spinal cord signal is within normal limits at all visualized levels. The conus medullaris occurs below the L1 level, see lumbar comparison from today. There is abnormal increased signal intensity in the left lower lobe (series 8, image 32), a within nonspecific appearance. There is aneurysmal enlargement of the descending thoracic aorta at the T9-T10 level measuring 4.3 cm with mural plaque or thrombus (series 8, image 30). There is also a small right lung pulmonary nodule on image 26. Negative visualized upper abdominal viscera. IMPRESSION: 1. No thoracic spinal stenosis, despite a chronic T12 compression fracture. Very mild for age thoracic spine degenerative changes. 2. Advanced cervical spine degeneration suspected with probable cervical spinal stenosis. Cervical spine MRI would evaluate further. 3. Recommend Chest CT (IV contrast preferred) to evaluate  nonspecific abnormal left lower lobe opacity, right lower lobe lung nodule, and to further evaluate a chronic but progressed descending thoracic aortic fusiform aneurysm to the abdomen CT on 03/08/2012. The descending thoracic aortic aneurysm diameters estimated at 4.3 cm on this exam Electronically Signed   By: Genevie Ann M.D.   On: 08/28/2015 20:23   Mr Lumbar Spine Wo Contrast  08/28/2015  CLINICAL DATA:  75 year old female with 3 weeks of bilateral lower extremity weakness. Ataxia. Unintentional weight loss for 2 months. Anemia. Initial encounter. EXAM: MRI LUMBAR SPINE WITHOUT CONTRAST TECHNIQUE: Multiplanar, multisequence MR imaging of the lumbar spine was performed. No intravenous contrast was administered. COMPARISON:  Thoracic spine MRI from today reported separately. [facility] retrograde pyelogram 08/01/2015. Renal ultrasound 01/09/2015. CT Abdomen and Pelvis 03/08/2012 FINDINGS: Segmentation:  Normal Alignment: Chronic T12 compression fracture unchanged since 2014. Lumbar vertebral height  and alignment is stable. There is mild retrolisthesis at L2-L3. Vertebrae: No marrow edema or evidence of acute osseous abnormality. Conus medullaris: Extends to the L1-L2 and appears normal. No lower thoracic spinal cord signal abnormality. Paraspinal and other soft tissues: Cholelithiasis. Furthermore, the CBD is dilated up to 16 mm diameter and there are low signal filling defects in the distal common bile duct as seen on series 6, image 10. These measure up to 10 mm diameter. The visible gallbladder does not appear inflamed. Chronic severe left renal atrophy, left hydronephrosis, and left hydroureter. Fluid fluid levels in the left renal collecting system. There is evidence of a right ureteral stent. There is mild right and hydroureter which is new since 2014. Diverticulosis of the sigmoid colon is partially visible in the pelvis. Disc levels: Mild lumbar spine degeneration above the L4 vertebral level. No significant lumbar spinal stenosis at those levels. Also, there are no significant degenerative changes at L5-S1. L4-L5: Circumferential disc bulge with superimposed right paracentral broad-based disc protrusion. Superimposed moderate facet and ligament flavum hypertrophy with a blunted and facet joint fluid. Moderate spinal stenosis (series 6, image 26). Mild right greater than left L4 foraminal stenosis. IMPRESSION: 1. Cholelithiasis AND Choledocholithiasis with dilated CBD up to 16 mm. See series 6, images 4 through 13. Recommend Gastroenterology consultation. 2. Fairly mild for age lumbar spine degeneration. Isolated moderate lumbar spinal stenosis at L4-L5 related to disc and posterior element degeneration. No acute osseous abnormality. 3. 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 There appears to be a right side ureteral stent in place. 4. See also thoracic spine MRI findings from today reported separately. Electronically Signed   By: Genevie Ann M.D.   On: 08/28/2015 20:39   US Abdomen  Complete  08/29/2015  CLINICAL DATA:  75 year old female with abnormal liver function. EXAM: ABDOMEN ULTRASOUND COMPLETE COMPARISON:  Renal ultrasound dated 01/09/2015 and lumbar spine MRI dated 08/28/2015 FINDINGS: Gallbladder: There are multiple stones within the gallbladder. There is no gallbladder wall thickening or pericholecystic fluid. Negative sonographic Murphy's sign. Common bile duct: Diameter: Dilated measuring up to 12 mm in the midportion. Stones noted in the central CBD on the lumbar MRI. Liver: No focal lesion identified. Within normal limits in parenchymal echogenicity. IVC: No abnormality visualized. Pancreas: Visualized portion unremarkable. Spleen: Size and appearance within normal limits. Right Kidney: Length: 9.6 cm. There is a 1.4 x 1 3 x 1.2 cm exophytic cyst from the anterior cortex of the right renal interpolar region. A right ureteral stent is partially visualized. There is mild right hydronephrosis. Left Kidney: Length: 11.9 cm. There is severe  left hydronephrosis with left renal parenchymal atrophy and cortical thinning. There is diffuse increased left renal parenchymal echogenicity. Abdominal aorta: The aorta measures 3.3 cm in diameter. The distal aorta is not visualized. Other findings: There is layering debris within the urinary bladder. Correlation with urinalysis recommended to exclude UTI. IMPRESSION: Cholelithiasis without sonographic evidence of acute cholecystitis. Mild right hydronephrosis. A ureteral stent is partially visualized in the right renal pelvis. Severe left hydronephrosis with parenchymal atrophy and cortical thinning of the left kidney. 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 CT may provide better evaluation of the aorta. Electronically Signed   By: Anner Crete M.D.   On: 08/29/2015 05:25   Dg Chest Port 1 View  09/03/2015  CLINICAL DATA:  Shortness of breath EXAM: PORTABLE CHEST 1 VIEW COMPARISON:  CT 08/28/2015 FINDINGS: Prior CABG. Left basilar scarring.  Heart is normal size. No acute confluent airspace opacities or effusions. No acute bony abnormality. IMPRESSION: No active disease. Electronically Signed   By: Rolm Baptise M.D.   On: 09/03/2015 15:34   Dg Ercp  09/03/2015  CLINICAL DATA:  75 year old female with cholelithiasis and choledocholithiasis EXAM: ERCP TECHNIQUE: Multiple spot images obtained with the fluoroscopic device and submitted for interpretation post-procedure. FLUOROSCOPY TIME:  If the device does not provide the exposure index: Fluoroscopy Time: 2 minutes reported. Please see GI note for further detail. Number of Acquired Images:  0 COMPARISON:  Right upper quadrant ultrasound 08/29/2015 FINDINGS: A total of 7 intraoperative spot images demonstrate a flexible endoscope in the descending duodenum followed by cannulation of the common bile duct and cholangiogram. Multiple faceted filling defects are present within the knee markedly dilated common duct concerning for choledocholithiasis. Subsequent images demonstrate sphincterotomy and balloon sweep of the common bile duct. Incidentally, there is a right-sided double-J ureteral stent which is partially imaged. IMPRESSION: 1. Choledocholithiasis with moderate intra and extrahepatic biliary ductal dilatation. 2. ERCP with sphincterotomy and balloon sweep of the bile duct. These images were submitted for radiologic interpretation only. Please see the procedural report for the amount of contrast and the fluoroscopy time utilized. Electronically Signed   By: Jacqulynn Cadet M.D.   On: 09/03/2015 14:50    Microbiology: No results found for this or any previous visit (from the past 240 hour(s)).   Labs: Basic Metabolic Panel:  Recent Labs Lab 09/05/15 0249 09/06/15 0255 09/06/15 0725  09/06/15 1915 09/06/15 2246 09/07/15 0230 09/07/15 0658 09/08/15 0220  NA 137 135 137  --   --   --  135  --  141  K 5.1 6.2* 6.0*  < > 5.2* 4.7 4.4 4.8 5.1  CL 105 107 104  --   --   --  103  --   98*  CO2 25 24 26   --   --   --  27  --  27  GLUCOSE 159* 163* 148*  --   --   --  184*  --  162*  BUN 27* 24* 24*  --   --   --  26*  --  26*  CREATININE 1.36* 1.33* 1.32*  --   --   --  1.43*  --  1.36*  CALCIUM 9.1 8.8* 9.0  --   --   --  8.6*  --  10.2  < > = values in this interval not displayed. Liver Function Tests:  Recent Labs Lab 09/04/15 0218 09/06/15 0255 09/08/15 0220  AST 45* 62* 23  ALT 60* 44 17  ALKPHOS 222*  175* 145*  BILITOT 0.5 0.4 0.6  PROT 5.7* 5.4* 5.2*  ALBUMIN 2.0* 2.2* 2.1*   No results for input(s): LIPASE, AMYLASE in the last 168 hours. No results for input(s): AMMONIA in the last 168 hours. CBC:  Recent Labs Lab 09/04/15 0218 09/05/15 0249 09/06/15 0255 09/07/15 0230 09/08/15 0220  WBC 9.0 8.2 12.2* 11.1* 10.2  HGB 9.4* 9.3* 8.7* 8.2* 8.5*  HCT 30.2* 31.2* 29.5* 28.2* 28.6*  MCV 83.2 86.9 86.5 89.5 88.3  PLT 367 388 385 353 362   Cardiac Enzymes: No results for input(s): CKTOTAL, CKMB, CKMBINDEX, TROPONINI in the last 168 hours. BNP: BNP (last 3 results) No results for input(s): BNP in the last 8760 hours.  ProBNP (last 3 results) No results for input(s): PROBNP in the last 8760 hours.  CBG:  Recent Labs Lab 09/07/15 0619 09/07/15 1104 09/07/15 1623 09/07/15 2123 09/08/15 0610  GLUCAP 141* 168* 146* 177* 139*       Signed:  Cristal Ford  [facility] 09/08/2015, 9:58 AM

## 2015-09-09 DIAGNOSIS — G8929 Other chronic pain: Secondary | ICD-10-CM | POA: Diagnosis not present

## 2015-09-09 DIAGNOSIS — M549 Dorsalgia, unspecified: Secondary | ICD-10-CM | POA: Diagnosis not present

## 2015-09-09 DIAGNOSIS — E1151 Type 2 diabetes mellitus with diabetic peripheral angiopathy without gangrene: Secondary | ICD-10-CM | POA: Diagnosis not present

## 2015-09-09 DIAGNOSIS — I255 Ischemic cardiomyopathy: Secondary | ICD-10-CM | POA: Diagnosis not present

## 2015-09-09 DIAGNOSIS — N184 Chronic kidney disease, stage 4 (severe): Secondary | ICD-10-CM | POA: Diagnosis not present

## 2015-09-09 DIAGNOSIS — I2583 Coronary atherosclerosis due to lipid rich plaque: Secondary | ICD-10-CM | POA: Diagnosis not present

## 2015-09-09 DIAGNOSIS — K8070 Calculus of gallbladder and bile duct without cholecystitis without obstruction: Secondary | ICD-10-CM | POA: Diagnosis not present

## 2015-09-09 DIAGNOSIS — K219 Gastro-esophageal reflux disease without esophagitis: Secondary | ICD-10-CM | POA: Diagnosis not present

## 2015-09-09 DIAGNOSIS — I13 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 1 through stage 4 chronic kidney disease, or unspecified chronic kidney disease: Secondary | ICD-10-CM | POA: Diagnosis not present

## 2015-09-09 DIAGNOSIS — Z951 Presence of aortocoronary bypass graft: Secondary | ICD-10-CM | POA: Diagnosis not present

## 2015-09-09 DIAGNOSIS — Z7982 Long term (current) use of aspirin: Secondary | ICD-10-CM | POA: Diagnosis not present

## 2015-09-09 DIAGNOSIS — I2510 Atherosclerotic heart disease of native coronary artery without angina pectoris: Secondary | ICD-10-CM | POA: Diagnosis not present

## 2015-09-09 DIAGNOSIS — E785 Hyperlipidemia, unspecified: Secondary | ICD-10-CM | POA: Diagnosis not present

## 2015-09-09 DIAGNOSIS — E1169 Type 2 diabetes mellitus with other specified complication: Secondary | ICD-10-CM | POA: Diagnosis not present

## 2015-09-09 DIAGNOSIS — Z794 Long term (current) use of insulin: Secondary | ICD-10-CM | POA: Diagnosis not present

## 2015-09-09 DIAGNOSIS — K5790 Diverticulosis of intestine, part unspecified, without perforation or abscess without bleeding: Secondary | ICD-10-CM | POA: Diagnosis not present

## 2015-09-09 DIAGNOSIS — D631 Anemia in chronic kidney disease: Secondary | ICD-10-CM | POA: Diagnosis not present

## 2015-09-09 DIAGNOSIS — N390 Urinary tract infection, site not specified: Secondary | ICD-10-CM | POA: Diagnosis not present

## 2015-09-09 DIAGNOSIS — E43 Unspecified severe protein-calorie malnutrition: Secondary | ICD-10-CM | POA: Diagnosis not present

## 2015-09-09 DIAGNOSIS — I509 Heart failure, unspecified: Secondary | ICD-10-CM | POA: Diagnosis not present

## 2015-09-09 DIAGNOSIS — L89890 Pressure ulcer of other site, unstageable: Secondary | ICD-10-CM | POA: Diagnosis not present

## 2015-09-09 DIAGNOSIS — E1121 Type 2 diabetes mellitus with diabetic nephropathy: Secondary | ICD-10-CM | POA: Diagnosis not present

## 2015-09-09 DIAGNOSIS — L89150 Pressure ulcer of sacral region, unstageable: Secondary | ICD-10-CM | POA: Diagnosis not present

## 2015-09-09 DIAGNOSIS — E1122 Type 2 diabetes mellitus with diabetic chronic kidney disease: Secondary | ICD-10-CM | POA: Diagnosis not present

## 2015-09-09 DIAGNOSIS — Z7902 Long term (current) use of antithrombotics/antiplatelets: Secondary | ICD-10-CM | POA: Diagnosis not present

## 2015-09-10 ENCOUNTER — Telehealth: Payer: Self-pay | Admitting: Endocrinology

## 2015-09-10 NOTE — Telephone Encounter (Signed)
[name], physical therapist with [facility] called, he stated; started care after recent hospitalization, requests verbal orders to continue therapy for 2x a week for 3 weeks, then 1x a week for 2 weeks.  Additional request for skilled nursing care for pressure sore treatment and medication management. PT has several unstageable wounds on sacrum and feet and is at risk for skin breakdown.

## 2015-09-10 NOTE — Telephone Encounter (Signed)
PT requests call back, [phone]

## 2015-09-10 NOTE — Telephone Encounter (Signed)
Requested a call back from the pt to discuss.  

## 2015-09-10 NOTE — Telephone Encounter (Signed)
ok 

## 2015-09-10 NOTE — Telephone Encounter (Signed)
His CB# [phone]

## 2015-09-10 NOTE — Telephone Encounter (Signed)
I contacted [name] [name] with [facility] and gave verbal ok from Dr. Dwyane Dee.

## 2015-09-15 DIAGNOSIS — N390 Urinary tract infection, site not specified: Secondary | ICD-10-CM | POA: Diagnosis not present

## 2015-09-15 DIAGNOSIS — I13 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 1 through stage 4 chronic kidney disease, or unspecified chronic kidney disease: Secondary | ICD-10-CM | POA: Diagnosis not present

## 2015-09-15 DIAGNOSIS — E1121 Type 2 diabetes mellitus with diabetic nephropathy: Secondary | ICD-10-CM | POA: Diagnosis not present

## 2015-09-15 DIAGNOSIS — N184 Chronic kidney disease, stage 4 (severe): Secondary | ICD-10-CM | POA: Diagnosis not present

## 2015-09-15 DIAGNOSIS — E1122 Type 2 diabetes mellitus with diabetic chronic kidney disease: Secondary | ICD-10-CM | POA: Diagnosis not present

## 2015-09-15 DIAGNOSIS — I509 Heart failure, unspecified: Secondary | ICD-10-CM | POA: Diagnosis not present

## 2015-09-17 DIAGNOSIS — N184 Chronic kidney disease, stage 4 (severe): Secondary | ICD-10-CM | POA: Diagnosis not present

## 2015-09-17 DIAGNOSIS — I509 Heart failure, unspecified: Secondary | ICD-10-CM | POA: Diagnosis not present

## 2015-09-17 DIAGNOSIS — E1122 Type 2 diabetes mellitus with diabetic chronic kidney disease: Secondary | ICD-10-CM | POA: Diagnosis not present

## 2015-09-17 DIAGNOSIS — E1121 Type 2 diabetes mellitus with diabetic nephropathy: Secondary | ICD-10-CM | POA: Diagnosis not present

## 2015-09-17 DIAGNOSIS — I13 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 1 through stage 4 chronic kidney disease, or unspecified chronic kidney disease: Secondary | ICD-10-CM | POA: Diagnosis not present

## 2015-09-17 DIAGNOSIS — N390 Urinary tract infection, site not specified: Secondary | ICD-10-CM | POA: Diagnosis not present

## 2015-09-22 DIAGNOSIS — I509 Heart failure, unspecified: Secondary | ICD-10-CM | POA: Diagnosis not present

## 2015-09-22 DIAGNOSIS — I13 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 1 through stage 4 chronic kidney disease, or unspecified chronic kidney disease: Secondary | ICD-10-CM | POA: Diagnosis not present

## 2015-09-22 DIAGNOSIS — N390 Urinary tract infection, site not specified: Secondary | ICD-10-CM | POA: Diagnosis not present

## 2015-09-22 DIAGNOSIS — E1121 Type 2 diabetes mellitus with diabetic nephropathy: Secondary | ICD-10-CM | POA: Diagnosis not present

## 2015-09-22 DIAGNOSIS — E1122 Type 2 diabetes mellitus with diabetic chronic kidney disease: Secondary | ICD-10-CM | POA: Diagnosis not present

## 2015-09-22 DIAGNOSIS — N184 Chronic kidney disease, stage 4 (severe): Secondary | ICD-10-CM | POA: Diagnosis not present

## 2015-09-22 MED ORDER — ROSUVASTATIN CALCIUM 20 MG PO TABS: 20.0000 mg | ORAL_TABLET | Freq: Every day | ORAL | Status: DC

## 2015-09-22 NOTE — Telephone Encounter (Signed)
Rx submitted per pt's request.  

## 2015-09-22 NOTE — Telephone Encounter (Signed)
Patient need a refill of medication CRESTOR 20 MG tablet send to  [facility] [place], [place] to [employer] [phone] (Phone) [phone] (Fax)

## 2015-09-25 ENCOUNTER — Telehealth: Payer: Self-pay | Admitting: Endocrinology

## 2015-09-25 DIAGNOSIS — E1122 Type 2 diabetes mellitus with diabetic chronic kidney disease: Secondary | ICD-10-CM | POA: Diagnosis not present

## 2015-09-25 DIAGNOSIS — I509 Heart failure, unspecified: Secondary | ICD-10-CM | POA: Diagnosis not present

## 2015-09-25 DIAGNOSIS — N390 Urinary tract infection, site not specified: Secondary | ICD-10-CM | POA: Diagnosis not present

## 2015-09-25 DIAGNOSIS — E1121 Type 2 diabetes mellitus with diabetic nephropathy: Secondary | ICD-10-CM | POA: Diagnosis not present

## 2015-09-25 DIAGNOSIS — I13 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 1 through stage 4 chronic kidney disease, or unspecified chronic kidney disease: Secondary | ICD-10-CM | POA: Diagnosis not present

## 2015-09-25 DIAGNOSIS — N184 Chronic kidney disease, stage 4 (severe): Secondary | ICD-10-CM | POA: Diagnosis not present

## 2015-09-25 NOTE — Telephone Encounter (Signed)
Patient right foot is bruised it hurts when she walk on it. Please advise

## 2015-09-25 NOTE — Telephone Encounter (Signed)
She needs to go to the urgent care center or orthopedic doctor

## 2015-09-25 NOTE — Telephone Encounter (Signed)
Attempted to reach the pt. Pt was unavailable. Will try again at a later time.  

## 2015-09-25 NOTE — Telephone Encounter (Signed)
PT returning your phone call 

## 2015-09-25 NOTE — Telephone Encounter (Signed)
Bottom of her foot is really sore please call # [phone]

## 2015-09-25 NOTE — Telephone Encounter (Signed)
I contacted the pt and advised of note below via vm. Requested a call back if she would like to discuss.

## 2015-09-26 DIAGNOSIS — N390 Urinary tract infection, site not specified: Secondary | ICD-10-CM | POA: Diagnosis not present

## 2015-09-26 DIAGNOSIS — E1121 Type 2 diabetes mellitus with diabetic nephropathy: Secondary | ICD-10-CM | POA: Diagnosis not present

## 2015-09-26 DIAGNOSIS — E1122 Type 2 diabetes mellitus with diabetic chronic kidney disease: Secondary | ICD-10-CM | POA: Diagnosis not present

## 2015-09-26 DIAGNOSIS — I13 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 1 through stage 4 chronic kidney disease, or unspecified chronic kidney disease: Secondary | ICD-10-CM | POA: Diagnosis not present

## 2015-09-26 DIAGNOSIS — N184 Chronic kidney disease, stage 4 (severe): Secondary | ICD-10-CM | POA: Diagnosis not present

## 2015-09-26 DIAGNOSIS — I509 Heart failure, unspecified: Secondary | ICD-10-CM | POA: Diagnosis not present

## 2015-09-29 ENCOUNTER — Telehealth: Payer: Self-pay | Admitting: Endocrinology

## 2015-09-29 ENCOUNTER — Other Ambulatory Visit: Payer: Self-pay | Admitting: Endocrinology

## 2015-09-29 ENCOUNTER — Telehealth: Payer: Self-pay

## 2015-09-29 DIAGNOSIS — N390 Urinary tract infection, site not specified: Secondary | ICD-10-CM | POA: Diagnosis not present

## 2015-09-29 DIAGNOSIS — I13 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 1 through stage 4 chronic kidney disease, or unspecified chronic kidney disease: Secondary | ICD-10-CM | POA: Diagnosis not present

## 2015-09-29 DIAGNOSIS — I509 Heart failure, unspecified: Secondary | ICD-10-CM | POA: Diagnosis not present

## 2015-09-29 DIAGNOSIS — N184 Chronic kidney disease, stage 4 (severe): Secondary | ICD-10-CM | POA: Diagnosis not present

## 2015-09-29 DIAGNOSIS — E1122 Type 2 diabetes mellitus with diabetic chronic kidney disease: Secondary | ICD-10-CM | POA: Diagnosis not present

## 2015-09-29 DIAGNOSIS — L97501 Non-pressure chronic ulcer of other part of unspecified foot limited to breakdown of skin: Secondary | ICD-10-CM

## 2015-09-29 DIAGNOSIS — E1121 Type 2 diabetes mellitus with diabetic nephropathy: Secondary | ICD-10-CM | POA: Diagnosis not present

## 2015-09-29 NOTE — Telephone Encounter (Signed)
Called and notified home health nurse of verbal order to dress the ulcer. Advised her to call back if any questions or concerns.

## 2015-09-29 NOTE — Telephone Encounter (Signed)
Podiatry referral has been done, she can start taking gabapentin 300 mg 3 times a day as needed for pain. Meanwhile nurse can start dressing the ulcer, clean with peroxide and apply triple antibiotic

## 2015-09-29 NOTE — Telephone Encounter (Signed)
[place] form [facility]  Need a verbal order for patient has stage two bed sore on bottom, and having bad foot foot pain. Need order  For 2 weeks to one week , please advise [phone]

## 2015-09-29 NOTE — Telephone Encounter (Signed)
Physical therapy has been coming out to see patient, nurse came to see her today and noticied stage 2 ulcer on bottom which is open to air; patient complaints of severe feet pain when walking; soreness when walking. Nurse asked if maybe we could get special shoes or a Podiatrist referral for patient?  Also need to get a verbal order if it would be okay for a nurse to go twice the rest of this week to see patient, and once the following couple of weeks to assess and treat ulcer. Nurse asked if there were any regulations you would like for them to do regarding the wound care or just do what they would normally do for those types of wounds? The nurse would also follow up on foot pain for patient. [facility] for care.   Please advise. Thank you!

## 2015-09-30 ENCOUNTER — Other Ambulatory Visit: Payer: Self-pay

## 2015-09-30 MED ORDER — GABAPENTIN 300 MG PO CAPS: 300.0000 mg | ORAL_CAPSULE | Freq: Three times a day (TID) | ORAL | 3 refills | Status: DC | PRN

## 2015-09-30 NOTE — Telephone Encounter (Signed)
Can u call in rx for th gabapentin for the pt foot pain to [facility]

## 2015-10-01 ENCOUNTER — Encounter: Payer: Self-pay | Admitting: Endocrinology

## 2015-10-01 ENCOUNTER — Ambulatory Visit (INDEPENDENT_AMBULATORY_CARE_PROVIDER_SITE_OTHER): Payer: Medicare Other | Admitting: Endocrinology

## 2015-10-01 VITALS — BP 140/70 | HR 66 | Wt 134.0 lb

## 2015-10-01 DIAGNOSIS — N390 Urinary tract infection, site not specified: Secondary | ICD-10-CM | POA: Diagnosis not present

## 2015-10-01 DIAGNOSIS — D649 Anemia, unspecified: Secondary | ICD-10-CM

## 2015-10-01 DIAGNOSIS — N183 Chronic kidney disease, stage 3 unspecified: Secondary | ICD-10-CM

## 2015-10-01 DIAGNOSIS — E1122 Type 2 diabetes mellitus with diabetic chronic kidney disease: Secondary | ICD-10-CM | POA: Diagnosis not present

## 2015-10-01 DIAGNOSIS — E119 Type 2 diabetes mellitus without complications: Secondary | ICD-10-CM | POA: Diagnosis not present

## 2015-10-01 DIAGNOSIS — I6523 Occlusion and stenosis of bilateral carotid arteries: Secondary | ICD-10-CM

## 2015-10-01 DIAGNOSIS — I509 Heart failure, unspecified: Secondary | ICD-10-CM | POA: Diagnosis not present

## 2015-10-01 DIAGNOSIS — E1121 Type 2 diabetes mellitus with diabetic nephropathy: Secondary | ICD-10-CM | POA: Diagnosis not present

## 2015-10-01 DIAGNOSIS — E1142 Type 2 diabetes mellitus with diabetic polyneuropathy: Secondary | ICD-10-CM

## 2015-10-01 DIAGNOSIS — I13 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 1 through stage 4 chronic kidney disease, or unspecified chronic kidney disease: Secondary | ICD-10-CM | POA: Diagnosis not present

## 2015-10-01 DIAGNOSIS — N184 Chronic kidney disease, stage 4 (severe): Secondary | ICD-10-CM | POA: Diagnosis not present

## 2015-10-01 LAB — URINALYSIS, ROUTINE W REFLEX MICROSCOPIC
BILIRUBIN URINE: NEGATIVE
Ketones, ur: NEGATIVE
Nitrite: NEGATIVE
PH: 5.5 (ref 5.0–8.0)
Specific Gravity, Urine: 1.02 (ref 1.000–1.030)
Total Protein, Urine: 100 — AB
Urine Glucose: NEGATIVE
Urobilinogen, UA: 0.2 (ref 0.0–1.0)

## 2015-10-01 LAB — CBC WITH DIFFERENTIAL/PLATELET
BASOS ABS: 0 10*3/uL (ref 0.0–0.1)
Basophils Relative: 0.7 % (ref 0.0–3.0)
EOS ABS: 0.4 10*3/uL (ref 0.0–0.7)
Eosinophils Relative: 5.1 % — ABNORMAL HIGH (ref 0.0–5.0)
HEMATOCRIT: 28.5 % — AB (ref 36.0–46.0)
Hemoglobin: 9.4 g/dL — ABNORMAL LOW (ref 12.0–15.0)
LYMPHS ABS: 1.6 10*3/uL (ref 0.7–4.0)
LYMPHS PCT: 21.9 % (ref 12.0–46.0)
MCHC: 33 g/dL (ref 30.0–36.0)
MCV: 84 fl (ref 78.0–100.0)
MONOS PCT: 7.9 % (ref 3.0–12.0)
Monocytes Absolute: 0.6 10*3/uL (ref 0.1–1.0)
NEUTROS PCT: 64.4 % (ref 43.0–77.0)
Neutro Abs: 4.7 10*3/uL (ref 1.4–7.7)
Platelets: 310 10*3/uL (ref 150.0–400.0)
RBC: 3.39 Mil/uL — AB (ref 3.87–5.11)
RDW: 22 % — ABNORMAL HIGH (ref 11.5–15.5)
WBC: 7.3 10*3/uL (ref 4.0–10.5)

## 2015-10-01 LAB — COMPREHENSIVE METABOLIC PANEL
ALK PHOS: 97 U/L (ref 39–117)
ALT: 7 U/L (ref 0–35)
AST: 12 U/L (ref 0–37)
Albumin: 3.2 g/dL — ABNORMAL LOW (ref 3.5–5.2)
BILIRUBIN TOTAL: 0.3 mg/dL (ref 0.2–1.2)
BUN: 30 mg/dL — ABNORMAL HIGH (ref 6–23)
CO2: 22 meq/L (ref 19–32)
CREATININE: 1.39 mg/dL — AB (ref 0.40–1.20)
Calcium: 8.9 mg/dL (ref 8.4–10.5)
Chloride: 109 mEq/L (ref 96–112)
GFR: 39.34 mL/min — AB (ref >60.00)
GLUCOSE: 145 mg/dL — AB (ref 70–99)
Potassium: 5.4 mEq/L — ABNORMAL HIGH (ref 3.5–5.1)
Sodium: 137 mEq/L (ref 135–145)
TOTAL PROTEIN: 6.5 g/dL (ref 6.0–8.3)

## 2015-10-01 LAB — IBC PANEL
Iron: 33 ug/dL — ABNORMAL LOW (ref 42–145)
SATURATION RATIOS: 13.7 % — AB (ref 20.0–50.0)
Transferrin: 172 mg/dL — ABNORMAL LOW (ref 212.0–360.0)

## 2015-10-01 MED ORDER — HYDROCODONE-ACETAMINOPHEN 5-325 MG PO TABS: ORAL_TABLET | ORAL | 0 refills | Status: DC

## 2015-10-01 NOTE — Progress Notes (Signed)
Patient ID: [name] [name], female   DOB: [birth year], 75 y.o.   MRN: [id number]   Reason for Appointment: Follow-up of various problems, post hospital visit  History of Present Illness   The patient has the following complaints:   Pain in her feet.  She is complaining of her feet being tender and hurting on trying to walk and feel like they are bruised.  She is currently using Band-Aids and other cushioning to walk on them.  Was prescribed gabapentin a couple of days ago and has not started this as yet.  She said that she has a small decubitus ulcer on the back of her sacrum and the visiting nurse feels that it is healing  She has occasional pinkish or reddish discoloration to her urine but no burning or discomfort with urination or frequency.  Her urine culture showed mixed flora in the hospital.  Not clear what antibiotics she obtained in the hospital  RENAL dysfunction:  Renal function is relatively better since our hospital admission, probably improved with hydration   Lab Results  Component Value Date   CREATININE 1.36 (H) 09/08/2015   CREATININE 1.43 (H) 09/07/2015   CREATININE 1.32 (H) 09/06/2015   CREATININE 1.33 (H) 09/06/2015   Lab Results  Component Value Date   CREATININE 1.36 (H) 09/08/2015   BUN 26 (H) 09/08/2015   NA 141 09/08/2015   K 5.1 09/08/2015   CL 98 (L) 09/08/2015   CO2 27 09/08/2015      Type 2 diabetes mellitus, date of diagnosis: 1986.   The insulin regimen is: Levemir 20 units hs, Novolog 6-8 ac twice a day   Type 2 diabetes  has been treated in the last few years with low dose basal bolus insulin regimen. She has not been taking  any oral hypoglycemic drugs including metformin partly because of GI side effects  A1c is fairly good at 5.6, previously 6.4  She is checking her blood sugar at various times but infrequently recently Blood sugars are variable and as high as 220 and the morning but this week down to 106 Blood sugars  in the afternoons and [place] evenings range from 84-178 She has been consistent with her insulin She appears to have lost some weight  Side effects from medications: Diarrhea from metformin and nausea and vomiting from GLP-1 drugs   Monitors blood glucose: Less than 1 times a day  Glucometer: One Touch.  Blood Glucose readings: Recent average blood sugar 120 with a range 67-248, checking at various times  Meals: she is usually eating low fat meals especially recently meals usually at 11 AM,and supper at 5 pm.  Calorie intake: Usually controlled. Moderate Carbs Physical activity: exercise: Unable to do any   Dietician visit: Most recent:, 5/13.   Wt Readings from Last 3 Encounters:  10/01/15 134 lb (60.8 kg)  09/08/15 129 lb 9.6 oz (58.8 kg)  08/28/15 137 lb (62.1 kg)    Lab Results  Component Value Date   HGBA1C 5.6 08/29/2015   HGBA1C 6.4 06/30/2015   HGBA1C 5.6 12/27/2014   Lab Results  Component Value Date   MICROALBUR 119.4 (H) 06/30/2015   LDLCALC 49 09/13/2013   CREATININE 1.36 (H) 09/08/2015     3.  HYPOTHYROIDISM: She has had long-standing primary hypothyroidism TSH has been relatively low and because of her level of 0.21 she was told to take only 6-1/2 tablets a week of her 125 g dose TSH again is Low normal but in  the normal range now   Lab Results  Component Value Date   TSH 0.496 08/29/2015   TSH 0.34 (L) 08/27/2015   TSH 0.62 06/30/2015   FREET4 1.09 06/30/2015   FREET4 1.33 07/22/2014   FREET4 1.29 04/24/2014          Medication List       Accurate as of 10/01/15  4:10 PM. Always use your most recent med list.          albuterol 108 (90 Base) MCG/ACT inhaler Commonly known as:  PROVENTIL HFA;VENTOLIN HFA Inhale 2 puffs into the lungs every 6 (six) hours as needed for wheezing.   aspirin 81 MG chewable tablet Chew 81 mg by mouth at bedtime.   calcium-vitamin D 500-200 MG-UNIT tablet Commonly known as:  OSCAL WITH D Take 1 tablet by  mouth every morning.   clopidogrel 75 MG tablet Commonly known as:  PLAVIX TAKE 1 TABLET EVERY MORNING   Cranberry 250 MG Tabs Take 1 tablet by mouth daily.   feeding supplement (GLUCERNA SHAKE) Liqd Take 237 mLs by mouth 3 (three) times daily between meals.   ferrous sulfate 325 (65 FE) MG tablet Take 325 mg by mouth 3 (three) times a week.   furosemide 20 MG tablet Commonly known as:  LASIX take 1 tablet by mouth once daily   gabapentin 300 MG capsule Commonly known as:  NEURONTIN Take 1 capsule (300 mg total) by mouth 3 (three) times daily as needed.   HYDROcodone-acetaminophen 5-325 MG tablet Commonly known as:  NORCO/VICODIN Take 1 tablet every 6 hours as needed for back pain   insulin aspart 100 UNIT/ML FlexPen Commonly known as:  NOVOLOG Inject 8 units three times a day with meals   Insulin Detemir 100 UNIT/ML Pen Commonly known as:  LEVEMIR FLEXTOUCH Inject 22 units at bedtime.   lactobacillus acidophilus Tabs tablet Take 2 tablets by mouth 3 (three) times daily.   latanoprost 0.005 % ophthalmic solution Commonly known as:  XALATAN Place 1 drop into both eyes at bedtime.   levothyroxine 125 MCG tablet Commonly known as:  SYNTHROID, LEVOTHROID TAKE 1 TABLET DAILY   metoCLOPramide 5 MG tablet Commonly known as:  REGLAN Take 1 tablet (5 mg total) by mouth 4 (four) times daily.   metoprolol succinate 25 MG 24 hr tablet Commonly known as:  TOPROL-XL TAKE 1/2 TABLET (=12.5MG )  EVERY MORNING   omeprazole 20 MG capsule Commonly known as:  PRILOSEC TAKE 1 CAPSULE DAILY   phenazopyridine 200 MG tablet Commonly known as:  PYRIDIUM Take 1 tablet (200 mg total) by mouth 3 (three) times daily as needed for pain.   polyethylene glycol packet Commonly known as:  MIRALAX / GLYCOLAX Take 17 g by mouth daily as needed for moderate constipation.   rosuvastatin 20 MG tablet Commonly known as:  CRESTOR Take 1 tablet (20 mg total) by mouth daily.   VITAMIN B 12  PO Take by mouth.   vitamin C 500 MG tablet Commonly known as:  ASCORBIC ACID Take 500 mg by mouth every morning.   Vitamin D (Ergocalciferol) 50000 units Caps capsule Commonly known as:  DRISDOL TAKE 1 CAPSULE BY MOUTH EVERY 7 DAYS ON THURSDAYS       Allergies: No Known Allergies  Past Medical History:  Diagnosis Date  . Bilateral hydronephrosis   . Carotid artery stenosis    carotid doppler 06/2012 - Right CCA/Bulb/ICA with chronic occlusion; L vertebral artery with abnormal blood flow; L Bulb/Prox ICA  s/p  endarterectomy with mild fibrous plaque, 50% diameter reduction  . CHF (congestive heart failure) ([place])   . CKD (chronic kidney disease), stage III   . Coronary artery disease due to lipid rich plaque cardiologist-  dr berry   s/p CABG x6 1997-- cath 12-09-2009 occluded vein to obtuse marginal branch and ramus branch with patent vien to PDA and patent LIMA to LAD, ef 40%-- Myoview 11-24-2011, nonischemic  . Dyspnea on exertion   . GERD (gastroesophageal reflux disease)   . History of sepsis    10-18-2014 w/ acute pyelonephritis  . Hyperlipidemia   . Hypertension   . Hypothyroidism   . Ischemic cardiomyopathy    03-25-2010-- per lasts echo EF  50-55%  . PAD (peripheral artery disease) ([place])    09/2010 LEAs - R ABI of 0.45, occluded fem-pop bypass graft, L ABI of 0.59 with occluded SFA; severe arterial insuff  . PVD (peripheral vascular disease) with claudication ([place])    last duplex 07-04-2015 -- Right CCA and ICA chronic occlusion, [id number] LICA, Patent vertebral arteries w/ antegrade flow, bilateral normal subclavian arteries   . Retroperitoneal fibrosis   . Type 2 diabetes mellitus ([place])    monitored by dr Dwyane Dee    Past Surgical History:  Procedure Laterality Date  . AORTA - BILATERAL FEMORAL ARTERY BYPASS GRAFT  1997   and RIGHT FEM-POP   . CARDIAC CATHETERIZATION  12-09-2009  dr al little   EF >40%-- occluded vein to OM & ramus branches, patent vein to PDA, patent  LIMA to LAD (Dr. [name], [facility]) - later had thrombectomy of R fem-pop bypass ad R common femoral & profunda femoris artery (Dr. Oneida Alar)  . CARDIOVASCULAR STRESS TEST  11-24-2011   dr berry   Low Risk study: fixed basal to mid inferior attenuation artifact, no reversible ischemia,  normal LV function and wall motion , ef 67%  . CAROTID ENDARTERECTOMY Bilateral right 1994//  left ?  Marland Kitchen CATARACT EXTRACTION W/ INTRAOCULAR LENS  IMPLANT, BILATERAL  2006  . CHOLECYSTECTOMY N/A 09/05/2015   Procedure: ATTEMPTED LAPAROSCOPIC CHOLECYSTECTOMY, EXPLORATORY LAPAROTOMY WITH CHOLECYSTECTOMY;  Surgeon: Autumn Messing III, MD;  Location: [facility];  Service: General;  Laterality: N/A;  . CORONARY ARTERY BYPASS GRAFT  1997   x6; internal mammary to LAD, SVG to ramus #1 & #2, SVG to OM, SVG to PDA,   . CYSTOSCOPY W/ RETROGRADES Right 08/01/2015   Procedure: CYSTOSCOPY WITH RETROGRADE PYELOGRAM;  Surgeon: Ardis Hughs, MD;  Location: [facility];  Service: Urology;  Laterality: Right;  . CYSTOSCOPY W/ URETERAL STENT PLACEMENT  03/10/2012   Procedure: CYSTOSCOPY WITH RETROGRADE PYELOGRAM/URETERAL STENT PLACEMENT;  Surgeon: Hanley Ben, MD;  Location: [facility];  Service: Urology;  Laterality: Left;  . CYSTOSCOPY W/ URETERAL STENT PLACEMENT Bilateral 03/07/2015   Procedure: BILATERAL RETROGRADE PYELOGRAM AND RIGHT URETERAL STENT PLACEMENT;  Surgeon: Ardis Hughs, MD;  Location: [facility];  Service: Urology;  Laterality: Bilateral;  . CYSTOSCOPY W/ URETERAL STENT PLACEMENT Right 08/01/2015   Procedure: CYSTOSCOPY WITH STENT REPLACEMENT;  Surgeon: Ardis Hughs, MD;  Location: [facility];  Service: Urology;  Laterality: Right;  . ERCP N/A 09/03/2015   Procedure: ENDOSCOPIC RETROGRADE CHOLANGIOPANCREATOGRAPHY (ERCP);  Surgeon: Irene Shipper, MD;  Location: [facility];  Service: Endoscopy;  Laterality: N/A;  . REPAIR RIGHT FEMORAL PSEUDOANEUYSM/  RIGHT FEM-POP BYPASS  GRAFT/  DEBRIDEMENT RIGHT LOWER EXTREMITIY VENOUS STATUS ULCERS X2  01-12-2005  . TOTAL ABDOMINAL HYSTERECTOMY W/ BILATERAL SALPINGOOPHORECTOMY  1986  .  TRANSTHORACIC ECHOCARDIOGRAM  03/25/2010   EF 99991111, LV systolic function low normal with mild inferoseptal hypocontractility; LA mildly dilated; mod MR; mild TR, RV systolic pressure elevated, mild pulm HTN; AV mildly sclerotic; mild pulm valve regurg; aortic root sclerosis/calcif     Family History  Problem Relation Age of Onset  . Congestive Heart Failure Mother   . Diabetes Mother   . Stroke Father   . Cancer Maternal Aunt     Breast cancer    Social History:  reports that she quit smoking about 6 years ago. She has a 60.00 pack-year smoking history. She has never used smokeless tobacco. She reports that she drinks about 1.8 oz of alcohol per week . She reports that she does not use drugs.  Review of Systems -     Hyperlipidemia: Has history of high LDL and triglycerides/low HDL  Was changed from Lipitor to Crestor because of higher LDL and she was not wanting to pay the higher cost of adding Zetia   LDL is improved partly from her weight loss and decreased appetite  Tends to have high triglycerides also despite taking fenofibrate   Lab Results  Component Value Date   CHOL 131 06/30/2015   HDL 26.90 (L) 06/30/2015   LDLCALC 49 09/13/2013   LDLDIRECT 59.0 06/30/2015   TRIG 272.0 (H) 06/30/2015   CHOLHDL 5 06/30/2015   . LIVER dysfunction: This was possibly related to cholecystitis in the hospital and this resolved after surgery   Lab Results  Component Value Date   ALT 17 09/08/2015    Iron Deficiency anemia:  Has had mild anemia with iron deficiency Previously. Not on iron Currently.  Had severe anemia in the hospital but etiology not determine and she was given a transfusion Negative Hemoccult was found  She is taking her B12 as before  Lab Results  Component Value Date   WBC 10.2 09/08/2015   HGB 8.5 (L)  09/08/2015   HCT 28.6 (L) 09/08/2015   MCV 88.3 09/08/2015   PLT 362 09/08/2015   Lab Results  Component Value Date   OCCULTBLD NEGATIVE 08/28/2015   OCCULTBLD POSITIVE 11/28/2009   OCCULTBLD NEGATIVE 11/19/2009    Vitamin D deficiency: She has been on supplements and taking her weekly dose, Had a therapeutic level in 1/17   She is again complaining of low back pain which is chronic and wants a refill of her hydrocortisone   Examination:   BP 140/70 (BP Location: Left Arm, Patient Position: Sitting)   Pulse 66   Wt 134 lb (60.8 kg)   SpO2 97%   BMI 21.63 kg/m   Body mass index is 21.63 kg/m.    She is alert and pleasant, conversing normally Gait appears normal Foot exam shows normal distal monofilament sensation Plantar surfaces are normal to inspection No ankle edema   Assesment/PLAN:   Foot pain: May be neuropathy as she is having increasing sensitivity on the bottom of her feet without any obvious tenderness or lesions  ANEMIA: This is relatively significant with hemoglobin 8.5 in the hospital and has not had a follow-up in 7/3 Currently not on iron Has been seen by gastroenterologist in the hospital Will need evaluation today including iron levels She will continue B12  DIABETES: Blood sugars have been variable but the last few days appear to be improving This is despite her weight loss She will continue her  current basal bolus insulin  HYPOTHYROIDISM: Her TSH will be followed periodically  Renal insufficiency: This  needs to be followed up  ?  Hematuria: Will check urinalysis today.  She appears to have chronic pyuria anyway  [facility]  10/01/15

## 2015-10-02 ENCOUNTER — Other Ambulatory Visit: Payer: Self-pay

## 2015-10-02 DIAGNOSIS — N184 Chronic kidney disease, stage 4 (severe): Secondary | ICD-10-CM | POA: Diagnosis not present

## 2015-10-02 DIAGNOSIS — E1121 Type 2 diabetes mellitus with diabetic nephropathy: Secondary | ICD-10-CM | POA: Diagnosis not present

## 2015-10-02 DIAGNOSIS — I13 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 1 through stage 4 chronic kidney disease, or unspecified chronic kidney disease: Secondary | ICD-10-CM | POA: Diagnosis not present

## 2015-10-02 DIAGNOSIS — E1122 Type 2 diabetes mellitus with diabetic chronic kidney disease: Secondary | ICD-10-CM | POA: Diagnosis not present

## 2015-10-02 DIAGNOSIS — N390 Urinary tract infection, site not specified: Secondary | ICD-10-CM | POA: Diagnosis not present

## 2015-10-02 DIAGNOSIS — I509 Heart failure, unspecified: Secondary | ICD-10-CM | POA: Diagnosis not present

## 2015-10-02 MED ORDER — CIPROFLOXACIN HCL 500 MG PO TABS: 500.0000 mg | ORAL_TABLET | Freq: Two times a day (BID) | ORAL | 0 refills | Status: DC

## 2015-10-03 DIAGNOSIS — E1121 Type 2 diabetes mellitus with diabetic nephropathy: Secondary | ICD-10-CM | POA: Diagnosis not present

## 2015-10-03 DIAGNOSIS — N390 Urinary tract infection, site not specified: Secondary | ICD-10-CM | POA: Diagnosis not present

## 2015-10-03 DIAGNOSIS — E1122 Type 2 diabetes mellitus with diabetic chronic kidney disease: Secondary | ICD-10-CM | POA: Diagnosis not present

## 2015-10-03 DIAGNOSIS — I509 Heart failure, unspecified: Secondary | ICD-10-CM | POA: Diagnosis not present

## 2015-10-03 DIAGNOSIS — I13 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 1 through stage 4 chronic kidney disease, or unspecified chronic kidney disease: Secondary | ICD-10-CM | POA: Diagnosis not present

## 2015-10-03 DIAGNOSIS — N184 Chronic kidney disease, stage 4 (severe): Secondary | ICD-10-CM | POA: Diagnosis not present

## 2015-10-06 ENCOUNTER — Other Ambulatory Visit: Payer: Self-pay

## 2015-10-06 MED ORDER — PEN NEEDLES 31G X 6 MM MISC: 2 refills | Status: DC

## 2015-10-06 NOTE — Telephone Encounter (Signed)
PT called and stated the drug store is waiting for approval from [place] to fill her pen needles.

## 2015-10-06 NOTE — Telephone Encounter (Signed)
Rx submitted for Pen needles.

## 2015-10-07 ENCOUNTER — Telehealth: Payer: Self-pay | Admitting: Endocrinology

## 2015-10-07 DIAGNOSIS — I13 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 1 through stage 4 chronic kidney disease, or unspecified chronic kidney disease: Secondary | ICD-10-CM | POA: Diagnosis not present

## 2015-10-07 DIAGNOSIS — I509 Heart failure, unspecified: Secondary | ICD-10-CM | POA: Diagnosis not present

## 2015-10-07 DIAGNOSIS — E1122 Type 2 diabetes mellitus with diabetic chronic kidney disease: Secondary | ICD-10-CM | POA: Diagnosis not present

## 2015-10-07 DIAGNOSIS — N184 Chronic kidney disease, stage 4 (severe): Secondary | ICD-10-CM | POA: Diagnosis not present

## 2015-10-07 DIAGNOSIS — N390 Urinary tract infection, site not specified: Secondary | ICD-10-CM | POA: Diagnosis not present

## 2015-10-07 DIAGNOSIS — E1121 Type 2 diabetes mellitus with diabetic nephropathy: Secondary | ICD-10-CM | POA: Diagnosis not present

## 2015-10-07 NOTE — Telephone Encounter (Signed)
Pt is requesting a call back would not specify

## 2015-10-08 ENCOUNTER — Ambulatory Visit (INDEPENDENT_AMBULATORY_CARE_PROVIDER_SITE_OTHER): Payer: Medicare Other | Admitting: Cardiovascular Disease

## 2015-10-08 ENCOUNTER — Encounter: Payer: Self-pay | Admitting: Cardiovascular Disease

## 2015-10-08 DIAGNOSIS — I2510 Atherosclerotic heart disease of native coronary artery without angina pectoris: Secondary | ICD-10-CM

## 2015-10-08 DIAGNOSIS — I779 Disorder of arteries and arterioles, unspecified: Secondary | ICD-10-CM

## 2015-10-08 DIAGNOSIS — I6523 Occlusion and stenosis of bilateral carotid arteries: Secondary | ICD-10-CM | POA: Diagnosis not present

## 2015-10-08 DIAGNOSIS — I714 Abdominal aortic aneurysm, without rupture, unspecified: Secondary | ICD-10-CM

## 2015-10-08 DIAGNOSIS — I739 Peripheral vascular disease, unspecified: Secondary | ICD-10-CM | POA: Diagnosis not present

## 2015-10-08 DIAGNOSIS — I10 Essential (primary) hypertension: Secondary | ICD-10-CM

## 2015-10-08 DIAGNOSIS — I2583 Coronary atherosclerosis due to lipid rich plaque: Principal | ICD-10-CM

## 2015-10-08 NOTE — Telephone Encounter (Signed)
Requested a call back from the pt to discuss.  

## 2015-10-08 NOTE — Assessment & Plan Note (Signed)
History of carotid artery disease with Dopplers performed 06/26/15 revealing an occluded right common and internal carotid artery and moderate left ICA stenosis. This will be repeated on an annual basis.

## 2015-10-08 NOTE — Assessment & Plan Note (Signed)
History of CAD status post coronary artery bypass grafting X 6 in 1997. Cardiac catheterization performed by Dr. [name] 12/09/09 revealed an occluded vein to an OM and ramus branch, patent vein to the PDA and a patent LIMA to the LAD. Her EF was 40% at that time. She had a Myoview stress test performed 11/24/11 which was nonischemic. Recent echo revealed an EF of 55%. She denies chest pain or shortness of breath.

## 2015-10-08 NOTE — Progress Notes (Signed)
10/08/2015 [name] [name]   [birth year]  [id number]  Primary Physician [name], MD Primary Cardiologist: [name] MD [name]  HPI:  The patient is a 75 year old, mildly overweight, divorced Caucasian female, mother of 1 child who I last saw in the office 01/08/15.  She has a history of CAD status post coronary artery bypass grafting x6 in 1997. She has had bilateral carotid endarterectomies, aortobifemoral bypass grafting and right fem-pop bypass grafting with lifestyle-limiting claudication. Cath performed by Dr. [name] December 09, 2009, revealed an occluded vein to an OM and ramus branches with a patent vein to a PDA and a patent LIMA to the LAD. Her EF was 40% at that time. She quit smoking November 2011. She had an echo performed March 25, 2010, that showed normal LV systolic function and a Myoview performed November 24, 2011, which was nonischemic. She denies chest pain or shortness of breath. Her last lower extremity Dopplers performed in our office October 05, 2011, revealed a right ABI of 0.45 with an occluded fem-pop bypass graft and a left ABI of 0.59 with an occluded left SFA.since I saw her back 7 months ago she's remained clinically stable. She denies chest pain or shortness of breath. Her lower extremity arterial Dopplers show progression of disease on the right but though I do not think she is revascularizable given her comorbidities. She denies claudication. She was recently admitted with cholecystitis and underwent laparoscopic cholecystectomy. She is slowly recovering from this. A 2-D echocardiogram revealed an ejection fraction of 55% performed 08/29/15.   Current Outpatient Prescriptions  Medication Sig Dispense Refill  . albuterol (PROVENTIL HFA;VENTOLIN HFA) 108 (90 BASE) MCG/ACT inhaler Inhale 2 puffs into the lungs every 6 (six) hours as needed for wheezing. 1 Inhaler 5  . aspirin 81 MG chewable tablet Chew 81 mg by mouth at bedtime.     .  calcium-vitamin D (OSCAL WITH D) 500-200 MG-UNIT per tablet Take 1 tablet by mouth every morning.    . clopidogrel (PLAVIX) 75 MG tablet TAKE 1 TABLET EVERY MORNING 90 tablet 1  . Cranberry 250 MG TABS Take 1 tablet by mouth daily.     . Cyanocobalamin (VITAMIN B 12 PO) Take by mouth.    . feeding supplement, GLUCERNA SHAKE, (GLUCERNA SHAKE) LIQD Take 237 mLs by mouth 3 (three) times daily between meals.  0  . ferrous sulfate 325 (65 FE) MG tablet Take 325 mg by mouth 3 (three) times a week.     . gabapentin (NEURONTIN) 300 MG capsule Take 1 capsule (300 mg total) by mouth 3 (three) times daily as needed. 90 capsule 3  . HYDROcodone-acetaminophen (NORCO/VICODIN) 5-325 MG tablet Take 1 tablet every 6 hours as needed for back pain 60 tablet 0  . insulin aspart (NOVOLOG) 100 UNIT/ML FlexPen Inject 8 units three times a day with meals (Patient taking differently: 6 Units. Inject 8 units three times a day with meals) 15 mL 5  . Insulin Detemir (LEVEMIR FLEXTOUCH) 100 UNIT/ML Pen Inject 22 units at bedtime. (Patient taking differently: Inject 18 Units into the skin at bedtime. Inject 20 units at bedtime.) 30 mL 1  . Insulin Pen Needle (PEN NEEDLES) 31G X 6 MM MISC Use to inject insulin 4 times per day. 150 each 2  . lactobacillus acidophilus (BACID) TABS tablet Take 2 tablets by mouth 3 (three) times daily.    Marland Kitchen latanoprost (XALATAN) 0.005 % ophthalmic solution Place 1 drop into both eyes at bedtime.  0  . levothyroxine (SYNTHROID, LEVOTHROID) 125 MCG tablet TAKE 1 TABLET DAILY (Patient taking differently: TAKE 1 TABLET DAILY---  takes in am takes only 1/2 tablet on Saturday) 90 tablet 3  . metoprolol succinate (TOPROL-XL) 25 MG 24 hr tablet TAKE 1/2 TABLET (=12.5MG )  EVERY MORNING 45 tablet 3  . omeprazole (PRILOSEC) 20 MG capsule TAKE 1 CAPSULE DAILY (Patient taking differently: TAKE 1 CAPSULE DAILY--  takes in am) 90 capsule 3  . polyethylene glycol (MIRALAX / GLYCOLAX) packet Take 17 g by mouth daily  as needed for moderate constipation. 14 each 0  . rosuvastatin (CRESTOR) 20 MG tablet Take 1 tablet (20 mg total) by mouth daily. 90 tablet 1  . vitamin C (ASCORBIC ACID) 500 MG tablet Take 500 mg by mouth every morning.    . Vitamin D, Ergocalciferol, (DRISDOL) 50000 units CAPS capsule TAKE 1 CAPSULE BY MOUTH EVERY 7 DAYS ON THURSDAYS 4 capsule 5   No current facility-administered medications for this visit.     No Known Allergies  Social History   Social History  . Marital status: Divorced    Spouse name: N/A  . Number of children: 1  . Years of education: 55   Occupational History  . Not on file.   Social History Main Topics  . Smoking status: Former Smoker    Packs/day: 2.00    Years: 30.00    Quit date: 06/07/2009  . Smokeless tobacco: Never Used  . Alcohol use 1.8 oz/week    3 Standard drinks or equivalent per week     Comment: occasuional  . Drug use: No  . Sexual activity: Not on file   Other Topics Concern  . Not on file   Social History Narrative  . No narrative on file     Review of Systems: General: negative for chills, fever, night sweats or weight changes.  Cardiovascular: negative for chest pain, dyspnea on exertion, edema, orthopnea, palpitations, paroxysmal nocturnal dyspnea or shortness of breath Dermatological: negative for rash Respiratory: negative for cough or wheezing Urologic: negative for hematuria Abdominal: negative for nausea, vomiting, diarrhea, bright red blood per rectum, melena, or hematemesis Neurologic: negative for visual changes, syncope, or dizziness All other systems reviewed and are otherwise negative except as noted above.    Blood pressure (!) 134/42, pulse 62, height 5\' 6"  (1.676 m), weight 138 lb (62.6 kg).  General appearance: alert and no distress Neck: no adenopathy, no JVD, supple, symmetrical, trachea midline, thyroid not enlarged, symmetric, no tenderness/mass/nodules and Bilateral carotid bruits left much  (right Lungs: clear to auscultation bilaterally Heart: regular rate and rhythm, S1, S2 normal, no murmur, click, rub or gallop Extremities: extremities normal, atraumatic, no cyanosis or edema  EKG not performed today  ASSESSMENT AND PLAN:   CAD (coronary artery disease) History of CAD status post coronary artery bypass grafting X 6 in 1997. Cardiac catheterization performed by Dr. [name] 12/09/09 revealed an occluded vein to an OM and ramus branch, patent vein to the PDA and a patent LIMA to the LAD. Her EF was 40% at that time. She had a Myoview stress test performed 11/24/11 which was nonischemic. Recent echo revealed an EF of 55%. She denies chest pain or shortness of breath.  HTN (hypertension) History of hypertension blood pressure measured 134/42. She is on metoprolol. Continue current meds at current dosing  AAA (abdominal aortic aneurysm) History of abdominal aortic aneurysm status post aortobifemoral bypass grafting and femoropopliteal bypass grafting as well.  Claudication in peripheral  vascular disease ([place]) History of peripheral arterial disease status post aortobifemoral bypass grafting and right femoropopliteal bypass grafting. Dopplers performed 10/05/11 revealed a right ABI of 0.45 with an occluded femoropopliteal bypass graft and left ABI 0.59 with an occluded left SFA. She really denies claudication.  Carotid artery disease (HCC) History of carotid artery disease with Dopplers performed 06/26/15 revealing an occluded right common and internal carotid artery and moderate left ICA stenosis. This will be repeated on an annual basis.      [name] MD FACP,FACC,FAHA, [facility] 10/08/2015 10:31 AM

## 2015-10-08 NOTE — Assessment & Plan Note (Signed)
History of abdominal aortic aneurysm status post aortobifemoral bypass grafting and femoropopliteal bypass grafting as well.

## 2015-10-08 NOTE — Assessment & Plan Note (Signed)
History of hypertension blood pressure measured 134/42. She is on metoprolol. Continue current meds at current dosing

## 2015-10-08 NOTE — Patient Instructions (Signed)

## 2015-10-08 NOTE — Assessment & Plan Note (Signed)
History of peripheral arterial disease status post aortobifemoral bypass grafting and right femoropopliteal bypass grafting. Dopplers performed 10/05/11 revealed a right ABI of 0.45 with an occluded femoropopliteal bypass graft and left ABI 0.59 with an occluded left SFA. She really denies claudication.

## 2015-10-10 ENCOUNTER — Ambulatory Visit (INDEPENDENT_AMBULATORY_CARE_PROVIDER_SITE_OTHER): Payer: Medicare Other | Admitting: Podiatry

## 2015-10-10 ENCOUNTER — Encounter: Payer: Self-pay | Admitting: Podiatry

## 2015-10-10 VITALS — BP 98/57 | HR 66 | Resp 12

## 2015-10-10 DIAGNOSIS — B351 Tinea unguium: Secondary | ICD-10-CM

## 2015-10-10 DIAGNOSIS — M79605 Pain in left leg: Secondary | ICD-10-CM

## 2015-10-10 DIAGNOSIS — M79672 Pain in left foot: Secondary | ICD-10-CM | POA: Diagnosis not present

## 2015-10-10 DIAGNOSIS — E1149 Type 2 diabetes mellitus with other diabetic neurological complication: Secondary | ICD-10-CM | POA: Diagnosis not present

## 2015-10-10 DIAGNOSIS — I6523 Occlusion and stenosis of bilateral carotid arteries: Secondary | ICD-10-CM

## 2015-10-10 DIAGNOSIS — Q828 Other specified congenital malformations of skin: Secondary | ICD-10-CM

## 2015-10-10 DIAGNOSIS — E1151 Type 2 diabetes mellitus with diabetic peripheral angiopathy without gangrene: Secondary | ICD-10-CM | POA: Diagnosis not present

## 2015-10-10 DIAGNOSIS — E1140 Type 2 diabetes mellitus with diabetic neuropathy, unspecified: Secondary | ICD-10-CM | POA: Diagnosis not present

## 2015-10-10 DIAGNOSIS — M79671 Pain in right foot: Secondary | ICD-10-CM | POA: Diagnosis not present

## 2015-10-10 DIAGNOSIS — M79604 Pain in right leg: Secondary | ICD-10-CM

## 2015-10-10 NOTE — Progress Notes (Signed)
   Subjective:    Patient ID: [name] [name], female    DOB: [birth year], 75 y.o.   MRN: [id number]  HPI Chief Complaint  Patient presents with  . Nail Problem    PT REQUESTING FOR TOENAILS/CALLUS DEBRIDEMENT      Review of Systems  Musculoskeletal: Positive for gait problem.  Skin: Positive for color change.       Objective:   Physical Exam        Assessment & Plan:

## 2015-10-12 NOTE — Progress Notes (Signed)
Subjective:     Patient ID: [name] [name], female   DOB: 09/28/1940, 75 y.o.   MRN: [id number]  HPI patient presents with significant nail disease bilateral with thickness and brittle debris and also keratotic lesions plantar aspect both feet that are painful   Review of Systems  All other systems reviewed and are negative.      Objective:   Physical Exam  Constitutional: She is oriented to person, place, and time.  Cardiovascular: Intact distal pulses.   Musculoskeletal: Normal range of motion.  Neurological: She is oriented to person, place, and time.  Skin: Skin is warm and dry.  Nursing note and vitals reviewed.  I noted neurological sensation to be diminished both sharp Dole vibratory and mild diminishment of pulses PT DP. Patient's noted to have significant keratotic lesions plantar aspect fifth metatarsal both feet with at risk neuropathic condition and thick yellow brittle nailbeds 1-5 both feet that are E long gaited and painful when pressed. 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 Debrided nailbeds 1-5 both feet with no iatrogenic bleeding and lesions bilateral with no iatrogenic bleeding and recommended long-term diabetic shoes which we will get permission for. Patient will be seen back to recheck

## 2015-10-15 DIAGNOSIS — E1121 Type 2 diabetes mellitus with diabetic nephropathy: Secondary | ICD-10-CM | POA: Diagnosis not present

## 2015-10-15 DIAGNOSIS — E1122 Type 2 diabetes mellitus with diabetic chronic kidney disease: Secondary | ICD-10-CM | POA: Diagnosis not present

## 2015-10-15 DIAGNOSIS — I509 Heart failure, unspecified: Secondary | ICD-10-CM | POA: Diagnosis not present

## 2015-10-15 DIAGNOSIS — N390 Urinary tract infection, site not specified: Secondary | ICD-10-CM | POA: Diagnosis not present

## 2015-10-15 DIAGNOSIS — N184 Chronic kidney disease, stage 4 (severe): Secondary | ICD-10-CM | POA: Diagnosis not present

## 2015-10-15 DIAGNOSIS — I13 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 1 through stage 4 chronic kidney disease, or unspecified chronic kidney disease: Secondary | ICD-10-CM | POA: Diagnosis not present

## 2015-10-15 NOTE — Telephone Encounter (Signed)
See note below and please advise, Thanks! 

## 2015-10-15 NOTE — Telephone Encounter (Signed)
Catlin from [facility] stated that patient stage 2 bedsore is not getting any better with the direction you gave. She recommend Hydrocolloid dressing and patient is also bleeding when she pee, hinking uti Please advise

## 2015-10-15 NOTE — Telephone Encounter (Signed)
I contacted Caitlin with [facility] and advised of note below via vm. Requested a call back if she had any other questions.

## 2015-10-15 NOTE — Telephone Encounter (Signed)
She can have the dressing recommended.  She needs to talk to her urologist about blood in the urine

## 2015-10-18 ENCOUNTER — Emergency Department (HOSPITAL_COMMUNITY): Payer: Medicare Other

## 2015-10-18 ENCOUNTER — Encounter (HOSPITAL_COMMUNITY): Payer: Self-pay | Admitting: Internal Medicine

## 2015-10-18 ENCOUNTER — Inpatient Hospital Stay (HOSPITAL_COMMUNITY)
Admission: EM | Admit: 2015-10-18 | Discharge: 2015-10-23 | DRG: 378 | Disposition: A | Discharge status: Home Health | Payer: Medicare Other | Attending: Obstetrics and Gynecology | Admitting: Obstetrics and Gynecology

## 2015-10-18 DIAGNOSIS — I714 Abdominal aortic aneurysm, without rupture, unspecified: Secondary | ICD-10-CM | POA: Diagnosis present

## 2015-10-18 DIAGNOSIS — D122 Benign neoplasm of ascending colon: Secondary | ICD-10-CM | POA: Diagnosis not present

## 2015-10-18 DIAGNOSIS — I255 Ischemic cardiomyopathy: Secondary | ICD-10-CM | POA: Diagnosis present

## 2015-10-18 DIAGNOSIS — E785 Hyperlipidemia, unspecified: Secondary | ICD-10-CM | POA: Diagnosis present

## 2015-10-18 DIAGNOSIS — R1032 Left lower quadrant pain: Secondary | ICD-10-CM | POA: Diagnosis not present

## 2015-10-18 DIAGNOSIS — I13 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 1 through stage 4 chronic kidney disease, or unspecified chronic kidney disease: Secondary | ICD-10-CM | POA: Diagnosis not present

## 2015-10-18 DIAGNOSIS — Z9841 Cataract extraction status, right eye: Secondary | ICD-10-CM | POA: Diagnosis not present

## 2015-10-18 DIAGNOSIS — D638 Anemia in other chronic diseases classified elsewhere: Secondary | ICD-10-CM | POA: Diagnosis present

## 2015-10-18 DIAGNOSIS — Z7982 Long term (current) use of aspirin: Secondary | ICD-10-CM | POA: Diagnosis not present

## 2015-10-18 DIAGNOSIS — N136 Pyonephrosis: Secondary | ICD-10-CM | POA: Diagnosis not present

## 2015-10-18 DIAGNOSIS — E1151 Type 2 diabetes mellitus with diabetic peripheral angiopathy without gangrene: Secondary | ICD-10-CM | POA: Diagnosis not present

## 2015-10-18 DIAGNOSIS — E1122 Type 2 diabetes mellitus with diabetic chronic kidney disease: Secondary | ICD-10-CM | POA: Diagnosis present

## 2015-10-18 DIAGNOSIS — K5790 Diverticulosis of intestine, part unspecified, without perforation or abscess without bleeding: Secondary | ICD-10-CM | POA: Diagnosis not present

## 2015-10-18 DIAGNOSIS — N179 Acute kidney failure, unspecified: Secondary | ICD-10-CM | POA: Diagnosis present

## 2015-10-18 DIAGNOSIS — E039 Hypothyroidism, unspecified: Secondary | ICD-10-CM | POA: Diagnosis not present

## 2015-10-18 DIAGNOSIS — N183 Chronic kidney disease, stage 3 unspecified: Secondary | ICD-10-CM | POA: Diagnosis present

## 2015-10-18 DIAGNOSIS — E1140 Type 2 diabetes mellitus with diabetic neuropathy, unspecified: Secondary | ICD-10-CM | POA: Diagnosis not present

## 2015-10-18 DIAGNOSIS — Z87891 Personal history of nicotine dependence: Secondary | ICD-10-CM | POA: Diagnosis not present

## 2015-10-18 DIAGNOSIS — Z951 Presence of aortocoronary bypass graft: Secondary | ICD-10-CM | POA: Diagnosis not present

## 2015-10-18 DIAGNOSIS — K635 Polyp of colon: Secondary | ICD-10-CM | POA: Diagnosis not present

## 2015-10-18 DIAGNOSIS — N189 Chronic kidney disease, unspecified: Secondary | ICD-10-CM

## 2015-10-18 DIAGNOSIS — D649 Anemia, unspecified: Secondary | ICD-10-CM | POA: Diagnosis not present

## 2015-10-18 DIAGNOSIS — D62 Acute posthemorrhagic anemia: Secondary | ICD-10-CM | POA: Diagnosis present

## 2015-10-18 DIAGNOSIS — E119 Type 2 diabetes mellitus without complications: Secondary | ICD-10-CM

## 2015-10-18 DIAGNOSIS — Z7902 Long term (current) use of antithrombotics/antiplatelets: Secondary | ICD-10-CM

## 2015-10-18 DIAGNOSIS — I2583 Coronary atherosclerosis due to lipid rich plaque: Secondary | ICD-10-CM | POA: Diagnosis present

## 2015-10-18 DIAGNOSIS — D123 Benign neoplasm of transverse colon: Secondary | ICD-10-CM | POA: Diagnosis not present

## 2015-10-18 DIAGNOSIS — K921 Melena: Principal | ICD-10-CM | POA: Diagnosis present

## 2015-10-18 DIAGNOSIS — I509 Heart failure, unspecified: Secondary | ICD-10-CM | POA: Diagnosis present

## 2015-10-18 DIAGNOSIS — R918 Other nonspecific abnormal finding of lung field: Secondary | ICD-10-CM | POA: Diagnosis not present

## 2015-10-18 DIAGNOSIS — Z79899 Other long term (current) drug therapy: Secondary | ICD-10-CM

## 2015-10-18 DIAGNOSIS — E11649 Type 2 diabetes mellitus with hypoglycemia without coma: Secondary | ICD-10-CM

## 2015-10-18 DIAGNOSIS — K219 Gastro-esophageal reflux disease without esophagitis: Secondary | ICD-10-CM | POA: Diagnosis present

## 2015-10-18 DIAGNOSIS — Z794 Long term (current) use of insulin: Secondary | ICD-10-CM

## 2015-10-18 DIAGNOSIS — R5381 Other malaise: Secondary | ICD-10-CM | POA: Diagnosis not present

## 2015-10-18 DIAGNOSIS — R319 Hematuria, unspecified: Secondary | ICD-10-CM | POA: Diagnosis present

## 2015-10-18 DIAGNOSIS — D120 Benign neoplasm of cecum: Secondary | ICD-10-CM | POA: Diagnosis not present

## 2015-10-18 DIAGNOSIS — Z961 Presence of intraocular lens: Secondary | ICD-10-CM | POA: Diagnosis present

## 2015-10-18 DIAGNOSIS — Z9842 Cataract extraction status, left eye: Secondary | ICD-10-CM | POA: Diagnosis not present

## 2015-10-18 DIAGNOSIS — K922 Gastrointestinal hemorrhage, unspecified: Secondary | ICD-10-CM | POA: Diagnosis not present

## 2015-10-18 DIAGNOSIS — L8990 Pressure ulcer of unspecified site, unspecified stage: Secondary | ICD-10-CM | POA: Diagnosis present

## 2015-10-18 DIAGNOSIS — K5730 Diverticulosis of large intestine without perforation or abscess without bleeding: Secondary | ICD-10-CM | POA: Diagnosis present

## 2015-10-18 DIAGNOSIS — I10 Essential (primary) hypertension: Secondary | ICD-10-CM | POA: Diagnosis present

## 2015-10-18 HISTORY — DX: Calculus of bile duct without cholangitis or cholecystitis without obstruction: K80.50

## 2015-10-18 LAB — COMPREHENSIVE METABOLIC PANEL
ALT: 10 U/L — AB (ref 14–54)
AST: 16 U/L (ref 15–41)
Albumin: 2.5 g/dL — ABNORMAL LOW (ref 3.5–5.0)
Alkaline Phosphatase: 61 U/L (ref 38–126)
Anion gap: 10 (ref 5–15)
BUN: 46 mg/dL — AB (ref 6–20)
CHLORIDE: 109 mmol/L (ref 101–111)
CO2: 16 mmol/L — AB (ref 22–32)
CREATININE: 1.79 mg/dL — AB (ref 0.44–1.00)
Calcium: 8 mg/dL — ABNORMAL LOW (ref 8.9–10.3)
GFR calc Af Amer: 31 mL/min — ABNORMAL LOW (ref >60)
GFR calc non Af Amer: 27 mL/min — ABNORMAL LOW (ref >60)
Glucose, Bld: 134 mg/dL — ABNORMAL HIGH (ref 65–99)
Potassium: 4.3 mmol/L (ref 3.5–5.1)
SODIUM: 135 mmol/L (ref 135–145)
Total Bilirubin: 0.4 mg/dL (ref 0.3–1.2)
Total Protein: 4.9 g/dL — ABNORMAL LOW (ref 6.5–8.1)

## 2015-10-18 LAB — LACTIC ACID, PLASMA: LACTIC ACID, VENOUS: 1.5 mmol/L (ref 0.5–1.9)

## 2015-10-18 LAB — CBC WITH DIFFERENTIAL/PLATELET
Basophils Absolute: 0 10*3/uL (ref 0.0–0.1)
Basophils Relative: 0 %
EOS ABS: 0.2 10*3/uL (ref 0.0–0.7)
EOS PCT: 2 %
HCT: 16.9 % — ABNORMAL LOW (ref 36.0–46.0)
HEMOGLOBIN: 5.1 g/dL — AB (ref 12.0–15.0)
LYMPHS ABS: 1.6 10*3/uL (ref 0.7–4.0)
LYMPHS PCT: 16 %
MCH: 27.6 pg (ref 26.0–34.0)
MCHC: 30.2 g/dL (ref 30.0–36.0)
MCV: 91.4 fL (ref 78.0–100.0)
MONOS PCT: 5 %
Monocytes Absolute: 0.5 10*3/uL (ref 0.1–1.0)
NEUTROS PCT: 77 %
Neutro Abs: 7.5 10*3/uL (ref 1.7–7.7)
Platelets: 292 10*3/uL (ref 150–400)
RBC: 1.85 MIL/uL — ABNORMAL LOW (ref 3.87–5.11)
RDW: 19.6 % — AB (ref 11.5–15.5)
WBC: 9.8 10*3/uL (ref 4.0–10.5)

## 2015-10-18 LAB — GLUCOSE, CAPILLARY
Glucose-Capillary: 118 mg/dL — ABNORMAL HIGH (ref 65–99)
Glucose-Capillary: 151 mg/dL — ABNORMAL HIGH (ref 65–99)

## 2015-10-18 LAB — I-STAT TROPONIN, ED: TROPONIN I, POC: 0.03 ng/mL (ref 0.00–0.08)

## 2015-10-18 LAB — PROTIME-INR
INR: 1.2
Prothrombin Time: 15.3 seconds — ABNORMAL HIGH (ref 11.4–15.2)

## 2015-10-18 LAB — PREPARE RBC (CROSSMATCH)

## 2015-10-18 LAB — LIPASE, BLOOD: Lipase: 16 U/L (ref 11–51)

## 2015-10-18 LAB — POC OCCULT BLOOD, ED: Fecal Occult Bld: POSITIVE — AB

## 2015-10-18 MED ORDER — TRAZODONE HCL 50 MG PO TABS: 25.0000 mg | ORAL_TABLET | Freq: Every evening | ORAL | Status: DC | PRN

## 2015-10-18 MED ORDER — ONDANSETRON HCL 4 MG/2ML IJ SOLN: 4.0000 mg | Freq: Four times a day (QID) | INTRAMUSCULAR | Status: DC | PRN

## 2015-10-18 MED ORDER — ACETAMINOPHEN 325 MG PO TABS: 650.0000 mg | ORAL_TABLET | Freq: Four times a day (QID) | ORAL | Status: DC | PRN

## 2015-10-18 MED ORDER — ACETAMINOPHEN 650 MG RE SUPP: 650.0000 mg | Freq: Four times a day (QID) | RECTAL | Status: DC | PRN

## 2015-10-18 MED ORDER — SODIUM CHLORIDE 0.9 % IV SOLN
10.0000 mL/h | Freq: Once | INTRAVENOUS | Status: AC
  Administered 2015-10-18: 10 mL/h via INTRAVENOUS

## 2015-10-18 MED ORDER — PANTOPRAZOLE SODIUM 40 MG IV SOLR
40.0000 mg | Freq: Once | INTRAVENOUS | Status: AC
  Administered 2015-10-18: 40 mg via INTRAVENOUS
  Filled 2015-10-18: qty 40

## 2015-10-18 MED ORDER — SODIUM CHLORIDE 0.9 % IV SOLN: INTRAVENOUS | Status: DC

## 2015-10-18 MED ORDER — HYDROCODONE-ACETAMINOPHEN 5-325 MG PO TABS
1.0000 | ORAL_TABLET | Freq: Four times a day (QID) | ORAL | Status: DC | PRN
  Administered 2015-10-18 – 2015-10-20 (×2): 1 via ORAL
  Filled 2015-10-18 (×2): qty 1

## 2015-10-18 MED ORDER — SODIUM CHLORIDE 0.9 % IV BOLUS (SEPSIS)
1000.0000 mL | Freq: Once | INTRAVENOUS | Status: AC
  Administered 2015-10-18: 1000 mL via INTRAVENOUS

## 2015-10-18 MED ORDER — LATANOPROST 0.005 % OP SOLN
1.0000 [drp] | Freq: Every day | OPHTHALMIC | Status: DC
  Administered 2015-10-18 – 2015-10-22 (×5): 1 [drp] via OPHTHALMIC
  Filled 2015-10-18: qty 2.5

## 2015-10-18 MED ORDER — ROSUVASTATIN CALCIUM 10 MG PO TABS
20.0000 mg | ORAL_TABLET | Freq: Every day | ORAL | Status: DC
  Administered 2015-10-19 – 2015-10-22 (×4): 20 mg via ORAL
  Filled 2015-10-18 (×4): qty 2

## 2015-10-18 MED ORDER — SODIUM CHLORIDE 0.9 % IV SOLN
8.0000 mg/h | INTRAVENOUS | Status: DC
  Administered 2015-10-18 – 2015-10-19 (×2): 8 mg/h via INTRAVENOUS
  Filled 2015-10-18 (×4): qty 80

## 2015-10-18 MED ORDER — ALBUTEROL SULFATE (2.5 MG/3ML) 0.083% IN NEBU: 2.5000 mg | INHALATION_SOLUTION | Freq: Four times a day (QID) | RESPIRATORY_TRACT | Status: DC | PRN

## 2015-10-18 MED ORDER — LEVOTHYROXINE SODIUM 25 MCG PO TABS
125.0000 ug | ORAL_TABLET | Freq: Every day | ORAL | Status: DC
  Administered 2015-10-19 – 2015-10-23 (×5): 125 ug via ORAL
  Filled 2015-10-18 (×6): qty 1

## 2015-10-18 MED ORDER — METOPROLOL SUCCINATE ER 25 MG PO TB24
12.5000 mg | ORAL_TABLET | Freq: Every day | ORAL | Status: DC
  Administered 2015-10-19 – 2015-10-23 (×5): 12.5 mg via ORAL
  Filled 2015-10-18 (×5): qty 1

## 2015-10-18 MED ORDER — GABAPENTIN 300 MG PO CAPS
300.0000 mg | ORAL_CAPSULE | Freq: Three times a day (TID) | ORAL | Status: DC
  Administered 2015-10-18 – 2015-10-23 (×14): 300 mg via ORAL
  Filled 2015-10-18 (×14): qty 1

## 2015-10-18 MED ORDER — ALBUTEROL SULFATE HFA 108 (90 BASE) MCG/ACT IN AERS: 2.0000 | INHALATION_SPRAY | Freq: Four times a day (QID) | RESPIRATORY_TRACT | Status: DC | PRN

## 2015-10-18 MED ORDER — ONDANSETRON HCL 4 MG PO TABS: 4.0000 mg | ORAL_TABLET | Freq: Four times a day (QID) | ORAL | Status: DC | PRN

## 2015-10-18 NOTE — ED Triage Notes (Signed)
Per [facility] called out due to weakness, unable to stand.  Patient reports diarrhea with dark stools mixed with bright red blood x2 d ays.  Cholecystectomy in July.  Patient had kidney stent placed in May.  Patient also complains of lower back and lower abdominal pain that she says feels like menstrual cramping.  Patient is alert at this time.

## 2015-10-18 NOTE — Consult Note (Addendum)
Consultation  Referring Provider:     Dr. Nehemiah Settle [facility] Primary Care Physician:  [name], MD Primary Gastroenterologist:        Pyrtle Reason for Consultation:     GI bleed     Impression / Plan:   Melena/Upper GI bleed Acute blood loss anemia in setting of chronic disease anemia Clopidogrel + ASA use long-term  - CAD, Carotid disease, PVD   Plan for EGD tomorrow w/ possible bleeding Tx - ill be somehat higher risk as Plavix will be on board Agree w/ blood and PPI The risks and benefits as well as alternatives of endoscopic procedure(s) have been discussed and reviewed. All questions answered. The patient agrees to proceed.         HPI:   [name] [name] is a 75 y.o. female with a 2 d hx of diarrhea described as dark with progressive weakness - melena/heme + in ED and Hgb 5 from baseline 9. Feels better w/ IVF and blood that has started to transfuse. No dysphagia, change in bowels, appetite or eating issues prior to onset. Had a colonoscopy poor prep in 2011 did not repeat - EGD negative then. Reports reviewed Iron and TIB chronically low. On omeprazole 20 mg qd we think. No there NSAID's/salicylates other than ASA Had ERCP and CBD stone removal; 08/2015 Henrene Pastor) - report and images viewed UGI tract ok  Past Medical History:  Diagnosis Date  . Bilateral hydronephrosis   . Carotid artery stenosis    carotid doppler 06/2012 - Right CCA/Bulb/ICA with chronic occlusion; L vertebral artery with abnormal blood flow; L Bulb/Prox ICA  s/p endarterectomy with mild fibrous plaque, 50% diameter reduction  . CHF (congestive heart failure) ([place])   . Choledocholithiasis 2017  . CKD (chronic kidney disease), stage III   . Coronary artery disease due to lipid rich plaque cardiologist-  dr berry   s/p CABG x6 1997-- cath 12-09-2009 occluded vein to obtuse marginal branch and ramus branch with patent vien to PDA and patent LIMA to LAD, ef 40%-- Myoview 11-24-2011, nonischemic  . Dyspnea on  exertion   . GERD (gastroesophageal reflux disease)   . History of sepsis    10-18-2014 w/ acute pyelonephritis  . Hyperlipidemia   . Hypertension   . Hypothyroidism   . Ischemic cardiomyopathy    03-25-2010-- per lasts echo EF  50-55%  . PAD (peripheral artery disease) ([place])    09/2010 LEAs - R ABI of 0.45, occluded fem-pop bypass graft, L ABI of 0.59 with occluded SFA; severe arterial insuff  . PVD (peripheral vascular disease) with claudication ([place])    last duplex 07-04-2015 -- Right CCA and ICA chronic occlusion, [id number] LICA, Patent vertebral arteries w/ antegrade flow, bilateral normal subclavian arteries   . Retroperitoneal fibrosis   . Type 2 diabetes mellitus ([place])    monitored by dr Dwyane Dee    Past Surgical History:  Procedure Laterality Date  . AORTA - BILATERAL FEMORAL ARTERY BYPASS GRAFT  1997   and RIGHT FEM-POP   . CARDIAC CATHETERIZATION  12-09-2009  dr al little   EF >40%-- occluded vein to OM & ramus branches, patent vein to PDA, patent LIMA to LAD (Dr. [name], [facility]) - later had thrombectomy of R fem-pop bypass ad R common femoral & profunda femoris artery (Dr. Oneida Alar)  . CARDIOVASCULAR STRESS TEST  11-24-2011   dr berry   Low Risk study: fixed basal to mid inferior attenuation artifact, no reversible ischemia,  normal LV function and  wall motion , ef 67%  . CAROTID ENDARTERECTOMY Bilateral right 1994//  left ?  Marland Kitchen CATARACT EXTRACTION W/ INTRAOCULAR LENS  IMPLANT, BILATERAL  2006  . CHOLECYSTECTOMY N/A 09/05/2015   Procedure: ATTEMPTED LAPAROSCOPIC CHOLECYSTECTOMY, EXPLORATORY LAPAROTOMY WITH CHOLECYSTECTOMY;  Surgeon: Autumn Messing III, MD;  Location: [place];  Service: General;  Laterality: N/A;  . COLONOSCOPY    . CORONARY ARTERY BYPASS GRAFT  1997   x6; internal mammary to LAD, SVG to ramus #1 & #2, SVG to OM, SVG to PDA,   . CYSTOSCOPY W/ RETROGRADES Right 08/01/2015   Procedure: CYSTOSCOPY WITH RETROGRADE PYELOGRAM;  Surgeon: Ardis Hughs, MD;  Location[facility];  Service: Urology;  Laterality: Right;  . CYSTOSCOPY W/ URETERAL STENT PLACEMENT  03/10/2012   Procedure: CYSTOSCOPY WITH RETROGRADE PYELOGRAM/URETERAL STENT PLACEMENT;  Surgeon: Hanley Ben, MD;  Location: [facility];  Service: Urology;  Laterality: Left;  . CYSTOSCOPY W/ URETERAL STENT PLACEMENT Bilateral 03/07/2015   Procedure: BILATERAL RETROGRADE PYELOGRAM AND RIGHT URETERAL STENT PLACEMENT;  Surgeon: Ardis Hughs, MD;  Location[facility];  Service: Urology;  Laterality: Bilateral;  . CYSTOSCOPY W/ URETERAL STENT PLACEMENT Right 08/01/2015   Procedure: CYSTOSCOPY WITH STENT REPLACEMENT;  Surgeon: Ardis Hughs, MD;  Location: [facility];  Service: Urology;  Laterality: Right;  . ERCP N/A 09/03/2015   Procedure: ENDOSCOPIC RETROGRADE CHOLANGIOPANCREATOGRAPHY (ERCP);  Surgeon: Irene Shipper, MD;  Location: [facility];  Service: Endoscopy;  Laterality: N/A;  . ESOPHAGOGASTRODUODENOSCOPY    . REPAIR RIGHT FEMORAL PSEUDOANEUYSM/  RIGHT FEM-POP BYPASS GRAFT/  DEBRIDEMENT RIGHT LOWER EXTREMITIY VENOUS STATUS ULCERS X2  01-12-2005  . TOTAL ABDOMINAL HYSTERECTOMY W/ BILATERAL SALPINGOOPHORECTOMY  1986  . TRANSTHORACIC ECHOCARDIOGRAM  03/25/2010   EF 99991111, LV systolic function low normal with mild inferoseptal hypocontractility; LA mildly dilated; mod MR; mild TR, RV systolic pressure elevated, mild pulm HTN; AV mildly sclerotic; mild pulm valve regurg; aortic root sclerosis/calcif     Family History  Problem Relation Age of Onset  . Congestive Heart Failure Mother   . Diabetes Mother   . Stroke Father   . Cancer Maternal Aunt     Breast cancer    Social History  Substance Use Topics  . Smoking status: Former Smoker    Packs/day: 2.00    Years: 30.00    Quit date: 06/07/2009  . Smokeless tobacco: Never Used  . Alcohol use 1.8 oz/week    3 Standard drinks or equivalent per week     Comment: occasuional   Social History    Social History Narrative   Lives alone   Worked 68 yrs [employer]   1 daughter      Current Facility-Administered Medications  Medication Dose Route Frequency Provider Last Rate Last Dose  . 0.9 %  sodium chloride infusion   Intravenous Continuous Isla Pence, MD      . 0.9 %  sodium chloride infusion  10 mL/hr Intravenous Once Isla Pence, MD      . acetaminophen (TYLENOL) tablet 650 mg  650 mg Oral Q6H PRN Willia Craze, NP       Or  . acetaminophen (TYLENOL) suppository 650 mg  650 mg Rectal Q6H PRN Willia Craze, NP      . albuterol (PROVENTIL HFA;VENTOLIN HFA) 108 (90 Base) MCG/ACT inhaler 2 puff  2 puff Inhalation Q6H PRN Willia Craze, NP      . gabapentin (NEURONTIN) capsule 300 mg  300  mg Oral TID Willia Craze, NP      . HYDROcodone-acetaminophen (NORCO/VICODIN) 5-325 MG per tablet 1 tablet  1 tablet Oral Q6H PRN Willia Craze, NP      . latanoprost (XALATAN) 0.005 % ophthalmic solution 1 drop  1 drop Both Eyes QHS Willia Craze, NP      . Derrill Memo ON 10/19/2015] levothyroxine (SYNTHROID, LEVOTHROID) tablet 125 mcg  125 mcg Oral QAC breakfast Willia Craze, NP      . Derrill Memo ON 10/19/2015] metoprolol succinate (TOPROL-XL) 24 hr tablet 12.5 mg  12.5 mg Oral Daily Willia Craze, NP      . ondansetron [facility]) tablet 4 mg  4 mg Oral Q6H PRN Willia Craze, NP       Or  . ondansetron [facility]) injection 4 mg  4 mg Intravenous Q6H PRN Willia Craze, NP      . pantoprazole (PROTONIX) 80 mg in sodium chloride 0.9 % 250 mL (0.32 mg/mL) infusion  8 mg/hr Intravenous Continuous Isla Pence, MD 25 mL/hr at 10/18/15 1610 8 mg/hr at 10/18/15 1610  . rosuvastatin (CRESTOR) tablet 20 mg  20 mg Oral Daily Willia Craze, NP      . traZODone (DESYREL) tablet 25 mg  25 mg Oral QHS PRN Willia Craze, NP       Current Outpatient Prescriptions  Medication Sig Dispense Refill  . albuterol (PROVENTIL HFA;VENTOLIN HFA) 108 (90 BASE) MCG/ACT inhaler  Inhale 2 puffs into the lungs every 6 (six) hours as needed for wheezing. 1 Inhaler 5  . aspirin 81 MG chewable tablet Chew 81 mg by mouth at bedtime.     . calcium-vitamin D (OSCAL WITH D) 500-200 MG-UNIT per tablet Take 1 tablet by mouth every morning.    . clopidogrel (PLAVIX) 75 MG tablet TAKE 1 TABLET EVERY MORNING 90 tablet 1  . Cranberry 250 MG TABS Take 1 tablet by mouth daily.     . Cyanocobalamin (VITAMIN B 12 PO) Take 1 tablet by mouth daily.     . ferrous sulfate 325 (65 FE) MG tablet Take 325 mg by mouth every evening.     . gabapentin (NEURONTIN) 300 MG capsule Take 300 mg by mouth 3 (three) times daily as needed. For pain on the bottom of feet per patient    . HYDROcodone-acetaminophen (NORCO/VICODIN) 5-325 MG tablet Take 1 tablet every 6 hours as needed for back pain 60 tablet 0  . insulin aspart (NOVOLOG) 100 UNIT/ML FlexPen Inject 8 units three times a day with meals (Patient taking differently: 6 Units. Inject 8 units three times a day with meals) 15 mL 5  . Insulin Detemir (LEVEMIR FLEXTOUCH) 100 UNIT/ML Pen Inject 22 units at bedtime. (Patient taking differently: Inject 18 Units into the skin at bedtime. ) 30 mL 1  . latanoprost (XALATAN) 0.005 % ophthalmic solution Place 1 drop into both eyes at bedtime.  0  . levothyroxine (SYNTHROID, LEVOTHROID) 125 MCG tablet Take 125 mcg by mouth daily before breakfast.    . metoprolol succinate (TOPROL-XL) 25 MG 24 hr tablet TAKE 1/2 TABLET (=12.5MG )  EVERY MORNING 45 tablet 3  . omeprazole (PRILOSEC) 20 MG capsule TAKE 1 CAPSULE DAILY (Patient taking differently: TAKE 1 CAPSULE DAILY--  takes in am) 90 capsule 3  . polyethylene glycol (MIRALAX / GLYCOLAX) packet Take 17 g by mouth daily as needed for moderate constipation. 14 each 0  . rosuvastatin (CRESTOR) 20 MG tablet Take 1 tablet (20 mg total)  by mouth daily. 90 tablet 1  . vitamin C (ASCORBIC ACID) 500 MG tablet Take 500 mg by mouth every morning.    . Vitamin D, Ergocalciferol,  (DRISDOL) 50000 units CAPS capsule TAKE 1 CAPSULE BY MOUTH EVERY 7 DAYS ON THURSDAYS 4 capsule 5  . feeding supplement, GLUCERNA SHAKE, (GLUCERNA SHAKE) LIQD Take 237 mLs by mouth 3 (three) times daily between meals. (Patient not taking: Reported on 10/18/2015)  0  . gabapentin (NEURONTIN) 300 MG capsule Take 1 capsule (300 mg total) by mouth 3 (three) times daily as needed. (Patient not taking: Reported on 10/18/2015) 90 capsule 3  . Insulin Pen Needle (PEN NEEDLES) 31G X 6 MM MISC Use to inject insulin 4 times per day. 150 each 2  . levothyroxine (SYNTHROID, LEVOTHROID) 125 MCG tablet TAKE 1 TABLET DAILY (Patient not taking: Reported on 10/18/2015) 90 tablet 3    Allergies as of 10/18/2015  . (No Known Allergies)     Review of Systems:    This is positive for those things mentioned in the HPI. All other review of systems are negative.       Physical Exam:  Vital signs in last 24 hours: Temp:  [97.6 F (36.4 C)-97.8 F (36.6 C)] 97.8 F (36.6 C) (08/12 1611) Pulse Rate:  [72-78] 76 (08/12 1615) Resp:  [14-22] 17 (08/12 1615) BP: (100-112)/(50-88) 112/50 (08/12 1615) SpO2:  [95 %-100 %] 100 % (08/12 1615)    General:  Elderly and chronically ill and pale Eyes:  anicteric. ENT:   Mouth and posterior pharynx free of lesions. Poor dentition Neck:   supple w/o thyromegaly or mass.  Lungs: Clear to auscultation bilaterally. Heart:   S1S2, no rubs, murmurs, gallops. Abdomen:  soft, non-tender, no hepatosplenomegaly, hernia, or mass and BS+.  Lymph:  no cervical or supraclavicular adenopathy. Extremities:   no edema Neuro:  A&O x 3.  Psych:  appropriate mood and  Affect.   Data Reviewed:   LAB RESULTS:  Recent Labs  10/18/15 1345  WBC 9.8  HGB 5.1*  HCT 16.9*  PLT 292   BMET  Recent Labs  10/18/15 1345  NA 135  K 4.3  CL 109  CO2 16*  GLUCOSE 134*  BUN 46*  CREATININE 1.79*  CALCIUM 8.0*   LFT  Recent Labs  10/18/15 1345  PROT 4.9*  ALBUMIN 2.5*    AST 16  ALT 10*  ALKPHOS 61  BILITOT 0.4   PT/INR  Recent Labs  10/18/15 1345  LABPROT 15.3*  INR 1.20    STUDIES: Dg Chest Portable 1 View  Result Date: 10/18/2015 CLINICAL DATA:  Dark stool as well as stool with bright red blood x2 days. Hx of cholecystectomy on September 05, 2015. Pt denies any chest and abdominal pain at this time. Pt denies SOB and vomiting. Hx of CABG, CHF, DM, HTN, CKD EXAM: PORTABLE CHEST 1 VIEW COMPARISON:  09/03/2015 FINDINGS: There is focal opacity at the left lung base. This is consistent with pneumonia in the proper clinical setting. It may be due to atelectasis. There lungs are hyperexpanded but otherwise clear. Cardiac silhouette is normal in size. There stable changes from previous CABG surgery. No mediastinal or hilar masses or evidence of adenopathy. No pleural effusion or pneumothorax. IMPRESSION: Left lung base opacity consistent with pneumonia or atelectasis. Electronically Signed   By: Lajean Manes M.D.   On: 10/18/2015 14:17     Thanks   LOS: 0 days   @[place]ordero Surette  Simonne Maffucci, MD, [facility] @  10/18/2015, 4:45 PM

## 2015-10-18 NOTE — H&P (Signed)
History and Physical    [name] [name] [id number] DOB: [birth year] DOA: 10/18/2015  PCP: [name], MD  Patient coming from:   Home  Chief Complaint: Weakness, blood in stool  HPI: [name] [name] is a 75 y.o. female with multiple medical problems into limited to GERD , chronic kidney disease, coronary artery disease, hypertension, congestive heart failure, and type 2 diabetes problems.. Patient presented by EMS to the emergency department for weakness. . Patient is on chronic iron, she is really unsure about the blood in her stools but believes that they have been very dark over the last 2 days. Her stools have definitely been loose but she gets loose stools about once a month . Patient complained of lower back pain and lower abdominal pain upon arrival to the emergency department but she denies any abdominal pain to me. No nausea. She feels weak , no chest pain . Patient is on Plavix, she takes a baby aspirin every day. Endoscopy 2011 for nausea and vomiting showed duodenitis and probable reflux esophagitis. Colonoscopy 2011 for evaluation of transverse colonic stricture seen on CT showed scattered diverticula throughout the colon. The prep was poor but colon was normal up to the proximal right colon. Cecum  could not be evaluated.  ED Course:  Afebrile, hemodynamically stable. White count normal, hemoglobin 5.1, baseline mid 8 to mid 9 range. Her MCV is 91.  Review of Systems: As per HPI, otherwise 10 point review of systems negative.   Past Medical History:  Diagnosis Date  . Bilateral hydronephrosis   . Carotid artery stenosis    carotid doppler 06/2012 - Right CCA/Bulb/ICA with chronic occlusion; L vertebral artery with abnormal blood flow; L Bulb/Prox ICA  s/p endarterectomy with mild fibrous plaque, 50% diameter reduction  . CHF (congestive heart failure) ([place])   . Choledocholithiasis 2017  . CKD (chronic kidney disease), stage III   . Coronary artery disease due to lipid rich  plaque cardiologist-  dr berry   s/p CABG x6 1997-- cath 12-09-2009 occluded vein to obtuse marginal branch and ramus branch with patent vien to PDA and patent LIMA to LAD, ef 40%-- Myoview 11-24-2011, nonischemic  . Dyspnea on exertion   . GERD (gastroesophageal reflux disease)   . History of sepsis    10-18-2014 w/ acute pyelonephritis  . Hyperlipidemia   . Hypertension   . Hypothyroidism   . Ischemic cardiomyopathy    03-25-2010-- per lasts echo EF  50-55%  . PAD (peripheral artery disease) ([place])    09/2010 LEAs - R ABI of 0.45, occluded fem-pop bypass graft, L ABI of 0.59 with occluded SFA; severe arterial insuff  . PVD (peripheral vascular disease) with claudication ([place])    last duplex 07-04-2015 -- Right CCA and ICA chronic occlusion, [id number] LICA, Patent vertebral arteries w/ antegrade flow, bilateral normal subclavian arteries   . Retroperitoneal fibrosis   . Type 2 diabetes mellitus ([place])    monitored by dr Dwyane Dee    Past Surgical History:  Procedure Laterality Date  . AORTA - BILATERAL FEMORAL ARTERY BYPASS GRAFT  1997   and RIGHT FEM-POP   . CARDIAC CATHETERIZATION  12-09-2009  dr al little   EF >40%-- occluded vein to OM & ramus branches, patent vein to PDA, patent LIMA to LAD (Dr. [name], [facility]) - later had thrombectomy of R fem-pop bypass ad R common femoral & profunda femoris artery (Dr. Oneida Alar)  . CARDIOVASCULAR STRESS TEST  11-24-2011   dr berry   Low Risk study: fixed  basal to mid inferior attenuation artifact, no reversible ischemia,  normal LV function and wall motion , ef 67%  . CAROTID ENDARTERECTOMY Bilateral right 1994//  left ?  Marland Kitchen CATARACT EXTRACTION W/ INTRAOCULAR LENS  IMPLANT, BILATERAL  2006  . CHOLECYSTECTOMY N/A 09/05/2015   Procedure: ATTEMPTED LAPAROSCOPIC CHOLECYSTECTOMY, EXPLORATORY LAPAROTOMY WITH CHOLECYSTECTOMY;  Surgeon: Autumn Messing III, MD;  Location: [facility];  Service: General;  Laterality: N/A;  . COLONOSCOPY    . CORONARY ARTERY BYPASS GRAFT  1997     x6; internal mammary to LAD, SVG to ramus #1 & #2, SVG to OM, SVG to PDA,   . CYSTOSCOPY W/ RETROGRADES Right 08/01/2015   Procedure: CYSTOSCOPY WITH RETROGRADE PYELOGRAM;  Surgeon: Ardis Hughs, MD;  Location: [facility];  Service: Urology;  Laterality: Right;  . CYSTOSCOPY W/ URETERAL STENT PLACEMENT  03/10/2012   Procedure: CYSTOSCOPY WITH RETROGRADE PYELOGRAM/URETERAL STENT PLACEMENT;  Surgeon: Hanley Ben, MD;  Location: [facility];  Service: Urology;  Laterality: Left;  . CYSTOSCOPY W/ URETERAL STENT PLACEMENT Bilateral 03/07/2015   Procedure: BILATERAL RETROGRADE PYELOGRAM AND RIGHT URETERAL STENT PLACEMENT;  Surgeon: Ardis Hughs, MD;  Location: [facility];  Service: Urology;  Laterality: Bilateral;  . CYSTOSCOPY W/ URETERAL STENT PLACEMENT Right 08/01/2015   Procedure: CYSTOSCOPY WITH STENT REPLACEMENT;  Surgeon: Ardis Hughs, MD;  Location: [facility];  Service: Urology;  Laterality: Right;  . ERCP N/A 09/03/2015   Procedure: ENDOSCOPIC RETROGRADE CHOLANGIOPANCREATOGRAPHY (ERCP);  Surgeon: Irene Shipper, MD;  Location: [facility]COPY;  Service: Endoscopy;  Laterality: N/A;  . ESOPHAGOGASTRODUODENOSCOPY    . REPAIR RIGHT FEMORAL PSEUDOANEUYSM/  RIGHT FEM-POP BYPASS GRAFT/  DEBRIDEMENT RIGHT LOWER EXTREMITIY VENOUS STATUS ULCERS X2  01-12-2005  . TOTAL ABDOMINAL HYSTERECTOMY W/ BILATERAL SALPINGOOPHORECTOMY  1986  . TRANSTHORACIC ECHOCARDIOGRAM  03/25/2010   EF 99991111, LV systolic function low normal with mild inferoseptal hypocontractility; LA mildly dilated; mod MR; mild TR, RV systolic pressure elevated, mild pulm HTN; AV mildly sclerotic; mild pulm valve regurg; aortic root sclerosis/calcif     Social History   Social History  . Marital status: Divorced    Spouse name: N/A  . Number of children: 1  . Years of education: 68   Occupational History  . Not on file.   Social History Main Topics  . Smoking status: Former  Smoker    Packs/day: 2.00    Years: 30.00    Quit date: 06/07/2009  . Smokeless tobacco: Never Used  . Alcohol use 1.8 oz/week    3 Standard drinks or equivalent per week     Comment: occasuional  . Drug use: No  . Sexual activity: Not on file   Other Topics Concern  . Not on file   Social History Narrative  . No narrative on file   Patient lives alone. She has a cane for ambulation. No Known Allergies  Family History  Problem Relation Age of Onset  . Congestive Heart Failure Mother   . Diabetes Mother   . Stroke Father   . Cancer Maternal Aunt     Breast cancer    Prior to Admission medications   Medication Sig Start Date End Date Taking? Authorizing Provider  albuterol (PROVENTIL HFA;VENTOLIN HFA) 108 (90 BASE) MCG/ACT inhaler Inhale 2 puffs into the lungs every 6 (six) hours as needed for wheezing. 02/22/14  Yes [name], MD  aspirin 81 MG chewable tablet Chew 81 mg by mouth at bedtime.    Yes  Historical Provider, MD  calcium-vitamin D (OSCAL WITH D) 500-200 MG-UNIT per tablet Take 1 tablet by mouth every morning.   Yes Historical Provider, MD  clopidogrel (PLAVIX) 75 MG tablet TAKE 1 TABLET EVERY MORNING 06/30/15  Yes [name], MD  Cranberry 250 MG TABS Take 1 tablet by mouth daily.    Yes Historical Provider, MD  Cyanocobalamin (VITAMIN B 12 PO) Take 1 tablet by mouth daily.    Yes Historical Provider, MD  ferrous sulfate 325 (65 FE) MG tablet Take 325 mg by mouth every evening.    Yes Historical Provider, MD  gabapentin (NEURONTIN) 300 MG capsule Take 300 mg by mouth 3 (three) times daily as needed. For pain on the bottom of feet per patient   Yes Historical Provider, MD  HYDROcodone-acetaminophen (NORCO/VICODIN) 5-325 MG tablet Take 1 tablet every 6 hours as needed for back pain 10/01/15  Yes [name], MD  insulin aspart (NOVOLOG) 100 UNIT/ML FlexPen Inject 8 units three times a day with meals Patient taking differently: 6 Units. Inject 8 units three times a day with  meals 09/30/14  Yes [name], MD  Insulin Detemir (LEVEMIR FLEXTOUCH) 100 UNIT/ML Pen Inject 22 units at bedtime. Patient taking differently: Inject 18 Units into the skin at bedtime.  01/13/15  Yes [name], MD  latanoprost (XALATAN) 0.005 % ophthalmic solution Place 1 drop into both eyes at bedtime. 03/11/14  Yes Historical Provider, MD  levothyroxine (SYNTHROID, LEVOTHROID) 125 MCG tablet Take 125 mcg by mouth daily before breakfast.   Yes Historical Provider, MD  metoprolol succinate (TOPROL-XL) 25 MG 24 hr tablet TAKE 1/2 TABLET (=12.5MG )  EVERY MORNING 06/24/15  Yes [name], MD  omeprazole (PRILOSEC) 20 MG capsule TAKE 1 CAPSULE DAILY Patient taking differently: TAKE 1 CAPSULE DAILY--  takes in am 02/12/15  Yes [name], MD  polyethylene glycol (MIRALAX / GLYCOLAX) packet Take 17 g by mouth daily as needed for moderate constipation. 09/08/15  Yes Maryann Mikhail, DO  rosuvastatin (CRESTOR) 20 MG tablet Take 1 tablet (20 mg total) by mouth daily. 09/22/15  Yes [name], MD  vitamin C (ASCORBIC ACID) 500 MG tablet Take 500 mg by mouth every morning.   Yes Historical Provider, MD  Vitamin D, Ergocalciferol, (DRISDOL) 50000 units CAPS capsule TAKE 1 CAPSULE BY MOUTH EVERY 7 DAYS ON THURSDAYS 08/19/15  Yes [name], MD  feeding supplement, GLUCERNA SHAKE, (GLUCERNA SHAKE) LIQD Take 237 mLs by mouth 3 (three) times daily between meals. Patient not taking: Reported on 10/18/2015 09/08/15   Velta Addison Mikhail, DO  gabapentin (NEURONTIN) 300 MG capsule Take 1 capsule (300 mg total) by mouth 3 (three) times daily as needed. Patient not taking: Reported on 10/18/2015 09/30/15   [name], MD  Insulin Pen Needle (PEN NEEDLES) 31G X 6 MM MISC Use to inject insulin 4 times per day. 10/06/15   [name], MD  levothyroxine (SYNTHROID, LEVOTHROID) 125 MCG tablet TAKE 1 TABLET DAILY Patient not taking: Reported on 10/18/2015 02/12/15   [name], MD    Physical Exam: Vitals:   10/18/15 1348 10/18/15 1430  10/18/15 1553  BP: 107/88 100/75 (!) 103/51  Pulse: 72 73 78  Resp: 14 16 22   Temp: 97.6 F (36.4 C)  97.7 F (36.5 C)  TempSrc: Oral  Oral  SpO2: 95% 100% 100%    Constitutional:  Pleasant, thin white female in NAD, calm, comfortable Vitals:   10/18/15 1348 10/18/15 1430 10/18/15 1553  BP: 107/88 100/75 (!) 103/51  Pulse:  72 73 78  Resp: 14 16 22   Temp: 97.6 F (36.4 C)  97.7 F (36.5 C)  TempSrc: Oral  Oral  SpO2: 95% 100% 100%   Eyes: PER, lids and conjunctivae normal ENMT: Mucous membranes are moist. Posterior pharynx clear of any exudate or lesions..  Neck: normal, supple, no masses Respiratory: Bibasilar crackles, right greater than left. Normal respiratory effort  Cardiovascular: Regular rate and rhythm, murmurs present, 1-2+ lateral reduction of any edema extremity edema. 2+ dorsal pedis pulses.   Abdomen: no tenderness, no masses palpated. No hepatomegaly. Bowel sounds positive.. Dark red blood in vault  Musculoskeletal: no clubbing / cyanosis. No joint deformity upper and lower extremities. Good ROM, no contractures. Normal muscle tone.  Skin: no rashes, lesions, ulcers. Large bruise over right hip/buttock Neurologic: CN 2-12 grossly intact. Sensation intact, Strength 5/5 in all 4.  Psychiatric: Normal judgment and insight. Alert and oriented x 3. Normal mood.     Labs on Admission: I have personally reviewed following labs and imaging studies  Radiological Exams on Admission: Dg Chest Portable 1 View  Result Date: 10/18/2015 CLINICAL DATA:  Dark stool as well as stool with bright red blood x2 days. Hx of cholecystectomy on September 05, 2015. Pt denies any chest and abdominal pain at this time. Pt denies SOB and vomiting. Hx of CABG, CHF, DM, HTN, CKD EXAM: PORTABLE CHEST 1 VIEW COMPARISON:  09/03/2015 FINDINGS: There is focal opacity at the left lung base. This is consistent with pneumonia in the proper clinical setting. It may be due to atelectasis. There lungs are  hyperexpanded but otherwise clear. Cardiac silhouette is normal in size. There stable changes from previous CABG surgery. No mediastinal or hilar masses or evidence of adenopathy. No pleural effusion or pneumothorax. IMPRESSION: Left lung base opacity consistent with pneumonia or atelectasis. Electronically Signed   By: Lajean Manes M.D.   On: 10/18/2015 14:17    EKG: Independently reviewed.   EKG Interpretation  Date/Time:  Saturday October 18 2015 13:42:39 EDT Ventricular Rate:  69 PR Interval:    QRS Duration: 88 QT Interval:  410 QTC Calculation: 440 R Axis:   78 Text Interpretation:  Sinus rhythm Repol abnrm suggests ischemia, diffuse leads st depression inferiorally and laterally Confirmed by [facility] MD, JULIE ([id number]) on 10/18/2015 2:38:49 PM       Assessment/Plan   Active Problems:   Diabetes mellitus, type II ([place])   HTN (hypertension)   AAA (abdominal aortic aneurysm) ([facility])   Hypothyroidism   Chronic kidney disease, stage III (moderate)   GI bleed      Gastrointestinal bleeding on Plavix. Upper versus lower, likely former. Patient describes dark to black stools over the last couple days but she has reddish colored blood in vault right now. Patient is hemodynamically stable.           - place in  [facility] consulted - just seen by Silvano Rusk, MD. For EGD in am -PPI gtt -clears ok, NPO after midnight  Acute on chronic anemia. Baseline hemoglobin mid 8 to mid 9 range. It was ~9.4 a few days ago, now down to 5.1 today --2 units of blood already ordered, 1st in progress     AKI superimposed on CKD 3 . Baseline Cr 1.39, up to 1.79  -avoid nephrotoxic medications.  -follow bmet  Abnormal CXR, bibasilar crackles on exam. CXR raises Suspicion of a left lung base pneumonia versus atelectasis.  No coughing, no respiratory distress. Normal white  count. .Will hold off on Antibiotics, monitor for now  DM2.  Followed outpatient by Endocrine -hold home  insulin -cbg, SSI  Hx of diastolic dysfunction, grade 2 on June 2017 echo. EF preserved at 55% -monitor I+O  Hypothyroidism.  . -Continue home Synthroid  CAD, remote CABG  Peripheral vascular disease.    DVT prophylaxis:   SCDs  Code Status:     Full code  Family Communication:    Treatment plan discussed with  daughter in the room and she understands and agrees with the plan..  Disposition Plan:   Discharge home in 24-48 hours              Consults called:  EDP called. [facility]  Admission status:   Admission -  Medical bed   Tye Savoy NP [facility] Pager [phone]  If 7PM-7AM, please contact night-coverage [email or web address] Password [facility]  10/18/2015, 3:58 PM

## 2015-10-18 NOTE — ED Provider Notes (Signed)
[facility] DEPT Provider Note   CSN: [id number] Arrival date & time: 10/18/15  1342  First Provider Contact:  None       History   Chief Complaint Chief Complaint  Patient presents with  . Weakness    HPI [name] [name] is a 75 y.o. female.  Pt called EMS today due to weakness and the inability to stand.  She said that she's had diarrhea that had been black with some blood mixed into it.  Pt is on plavix.  She c/o lower back and lower abd pain.  She has also noticed some blood in her urine.  She had a stent placed in her right kidney in May.      Past Medical History:  Diagnosis Date  . Bilateral hydronephrosis   . Carotid artery stenosis    carotid doppler 06/2012 - Right CCA/Bulb/ICA with chronic occlusion; L vertebral artery with abnormal blood flow; L Bulb/Prox ICA  s/p endarterectomy with mild fibrous plaque, 50% diameter reduction  . CHF (congestive heart failure) ([place])   . CKD (chronic kidney disease), stage III   . Coronary artery disease due to lipid rich plaque cardiologist-  dr berry   s/p CABG x6 1997-- cath 12-09-2009 occluded vein to obtuse marginal branch and ramus branch with patent vien to PDA and patent LIMA to LAD, ef 40%-- Myoview 11-24-2011, nonischemic  . Dyspnea on exertion   . GERD (gastroesophageal reflux disease)   . History of sepsis    10-18-2014 w/ acute pyelonephritis  . Hyperlipidemia   . Hypertension   . Hypothyroidism   . Ischemic cardiomyopathy    03-25-2010-- per lasts echo EF  50-55%  . PAD (peripheral artery disease) ([place])    09/2010 LEAs - R ABI of 0.45, occluded fem-pop bypass graft, L ABI of 0.59 with occluded SFA; severe arterial insuff  . PVD (peripheral vascular disease) with claudication ([place])    last duplex 07-04-2015 -- Right CCA and ICA chronic occlusion, [id number] LICA, Patent vertebral arteries w/ antegrade flow, bilateral normal subclavian arteries   . Retroperitoneal fibrosis   . Type 2 diabetes mellitus ([place])    monitored by dr Dwyane Dee    Patient Active Problem List   Diagnosis Date Noted  . GI bleed 10/18/2015  . Preop cardiovascular exam 09/04/2015  . Pain   . Weakness   . Abnormal liver function   . Choledocholithiasis   . Nausea without vomiting   . Pressure ulcer 08/29/2015  . Protein-calorie malnutrition, severe 08/29/2015  . Hypotension 08/28/2015  . Lung nodule   . Sepsis ([place]) 10/17/2014  . Hyperkalemia 10/17/2014  . Acute pyelonephritis 10/16/2014  . Hyperlipidemia 01/08/2014  . Type II or unspecified type diabetes mellitus with renal manifestations, uncontrolled 01/10/2013  . Carotid artery disease ([place]) 01/03/2013  . DOE (dyspnea on exertion) 11/20/2012  . Claudication in peripheral vascular disease ([place]) 11/20/2012  . Bradycardia 11/20/2012  . Chronic kidney disease, stage III (moderate) 11/08/2012  . Other and unspecified hyperlipidemia 10/01/2012  . Unspecified hypothyroidism 10/01/2012  . Chronic combined systolic and diastolic congestive heart failure ([place]) 06/08/2012  . CHF (congestive heart failure) ([place]) 05/07/2012  . Abdominal pain 03/09/2012  . Altered mental status 03/09/2012  . Diabetes mellitus, type II ([place]) 03/09/2012  . Arthritis 03/09/2012  . HTN (hypertension) 03/09/2012  . Acute encephalopathy 03/09/2012  . Normocytic anemia 03/09/2012  . Elevated LFTs 03/09/2012  . AAA (abdominal aortic aneurysm) ([place]) 03/09/2012  . UTI (lower urinary tract infection) 03/08/2012  . CAD (  coronary artery disease) 03/08/2012    Past Surgical History:  Procedure Laterality Date  . AORTA - BILATERAL FEMORAL ARTERY BYPASS GRAFT  1997   and RIGHT FEM-POP   . CARDIAC CATHETERIZATION  12-09-2009  dr al little   EF >40%-- occluded vein to OM & ramus branches, patent vein to PDA, patent LIMA to LAD (Dr. [name], [facility]) - later had thrombectomy of R fem-pop bypass ad R common femoral & profunda femoris artery (Dr. Oneida Alar)  . CARDIOVASCULAR STRESS TEST  11-24-2011   dr berry    Low Risk study: fixed basal to mid inferior attenuation artifact, no reversible ischemia,  normal LV function and wall motion , ef 67%  . CAROTID ENDARTERECTOMY Bilateral right 1994//  left ?  Marland Kitchen CATARACT EXTRACTION W/ INTRAOCULAR LENS  IMPLANT, BILATERAL  2006  . CHOLECYSTECTOMY N/A 09/05/2015   Procedure: ATTEMPTED LAPAROSCOPIC CHOLECYSTECTOMY, EXPLORATORY LAPAROTOMY WITH CHOLECYSTECTOMY;  Surgeon: Autumn Messing III, MD;  Location: [facility];  Service: General;  Laterality: N/A;  . CORONARY ARTERY BYPASS GRAFT  1997   x6; internal mammary to LAD, SVG to ramus #1 & #2, SVG to OM, SVG to PDA,   . CYSTOSCOPY W/ RETROGRADES Right 08/01/2015   Procedure: CYSTOSCOPY WITH RETROGRADE PYELOGRAM;  Surgeon: Ardis Hughs, MD;  Location: [facility];  Service: Urology;  Laterality: Right;  . CYSTOSCOPY W/ URETERAL STENT PLACEMENT  03/10/2012   Procedure: CYSTOSCOPY WITH RETROGRADE PYELOGRAM/URETERAL STENT PLACEMENT;  Surgeon: Hanley Ben, MD;  Location: [facility];  Service: Urology;  Laterality: Left;  . CYSTOSCOPY W/ URETERAL STENT PLACEMENT Bilateral 03/07/2015   Procedure: BILATERAL RETROGRADE PYELOGRAM AND RIGHT URETERAL STENT PLACEMENT;  Surgeon: Ardis Hughs, MD;  Location: [facility];  Service: Urology;  Laterality: Bilateral;  . CYSTOSCOPY W/ URETERAL STENT PLACEMENT Right 08/01/2015   Procedure: CYSTOSCOPY WITH STENT REPLACEMENT;  Surgeon: Ardis Hughs, MD;  Location: [facility];  Service: Urology;  Laterality: Right;  . ERCP N/A 09/03/2015   Procedure: ENDOSCOPIC RETROGRADE CHOLANGIOPANCREATOGRAPHY (ERCP);  Surgeon: Irene Shipper, MD;  Location: [facility];  Service: Endoscopy;  Laterality: N/A;  . REPAIR RIGHT FEMORAL PSEUDOANEUYSM/  RIGHT FEM-POP BYPASS GRAFT/  DEBRIDEMENT RIGHT LOWER EXTREMITIY VENOUS STATUS ULCERS X2  01-12-2005  . TOTAL ABDOMINAL HYSTERECTOMY W/ BILATERAL SALPINGOOPHORECTOMY  1986  . TRANSTHORACIC ECHOCARDIOGRAM  03/25/2010    EF 99991111, LV systolic function low normal with mild inferoseptal hypocontractility; LA mildly dilated; mod MR; mild TR, RV systolic pressure elevated, mild pulm HTN; AV mildly sclerotic; mild pulm valve regurg; aortic root sclerosis/calcif     OB History    No data available       Home Medications    Prior to Admission medications   Medication Sig Start Date End Date Taking? Authorizing Provider  albuterol (PROVENTIL HFA;VENTOLIN HFA) 108 (90 BASE) MCG/ACT inhaler Inhale 2 puffs into the lungs every 6 (six) hours as needed for wheezing. 02/22/14   [name], MD  aspirin 81 MG chewable tablet Chew 81 mg by mouth at bedtime.     Historical Provider, MD  calcium-vitamin D (OSCAL WITH D) 500-200 MG-UNIT per tablet Take 1 tablet by mouth every morning.    Historical Provider, MD  clopidogrel (PLAVIX) 75 MG tablet TAKE 1 TABLET EVERY MORNING 06/30/15   [name], MD  Cranberry 250 MG TABS Take 1 tablet by mouth daily.     Historical Provider, MD  Cyanocobalamin (VITAMIN B 12 PO) Take by mouth.    Historical Provider,  MD  feeding supplement, GLUCERNA SHAKE, (GLUCERNA SHAKE) LIQD Take 237 mLs by mouth 3 (three) times daily between meals. 09/08/15   Maryann Mikhail, DO  ferrous sulfate 325 (65 FE) MG tablet Take 325 mg by mouth 3 (three) times a week.     Historical Provider, MD  gabapentin (NEURONTIN) 300 MG capsule Take 1 capsule (300 mg total) by mouth 3 (three) times daily as needed. 09/30/15   [name], MD  HYDROcodone-acetaminophen (NORCO/VICODIN) 5-325 MG tablet Take 1 tablet every 6 hours as needed for back pain 10/01/15   [name], MD  insulin aspart (NOVOLOG) 100 UNIT/ML FlexPen Inject 8 units three times a day with meals Patient taking differently: 6 Units. Inject 8 units three times a day with meals 09/30/14   [name], MD  Insulin Detemir (LEVEMIR FLEXTOUCH) 100 UNIT/ML Pen Inject 22 units at bedtime. Patient taking differently: Inject 18 Units into the skin at bedtime. Inject 20  units at bedtime. 01/13/15   [name], MD  Insulin Pen Needle (PEN NEEDLES) 31G X 6 MM MISC Use to inject insulin 4 times per day. 10/06/15   [name], MD  lactobacillus acidophilus (BACID) TABS tablet Take 2 tablets by mouth 3 (three) times daily.    Historical Provider, MD  latanoprost (XALATAN) 0.005 % ophthalmic solution Place 1 drop into both eyes at bedtime. 03/11/14   Historical Provider, MD  levothyroxine (SYNTHROID, LEVOTHROID) 125 MCG tablet TAKE 1 TABLET DAILY Patient taking differently: TAKE 1 TABLET DAILY---  takes in am takes only 1/2 tablet on Saturday 02/12/15   [name], MD  metoprolol succinate (TOPROL-XL) 25 MG 24 hr tablet TAKE 1/2 TABLET (=12.5MG )  EVERY MORNING 06/24/15   [name], MD  omeprazole (PRILOSEC) 20 MG capsule TAKE 1 CAPSULE DAILY Patient taking differently: TAKE 1 CAPSULE DAILY--  takes in am 02/12/15   [name], MD  polyethylene glycol (MIRALAX / GLYCOLAX) packet Take 17 g by mouth daily as needed for moderate constipation. 09/08/15   Maryann Mikhail, DO  rosuvastatin (CRESTOR) 20 MG tablet Take 1 tablet (20 mg total) by mouth daily. 09/22/15   [name], MD  vitamin C (ASCORBIC ACID) 500 MG tablet Take 500 mg by mouth every morning.    Historical Provider, MD  Vitamin D, Ergocalciferol, (DRISDOL) 50000 units CAPS capsule TAKE 1 CAPSULE BY MOUTH EVERY 7 DAYS ON THURSDAYS 08/19/15   [name], MD    Family History Family History  Problem Relation Age of Onset  . Congestive Heart Failure Mother   . Diabetes Mother   . Stroke Father   . Cancer Maternal Aunt     Breast cancer    Social History Social History  Substance Use Topics  . Smoking status: Former Smoker    Packs/day: 2.00    Years: 30.00    Quit date: 06/07/2009  . Smokeless tobacco: Never Used  . Alcohol use 1.8 oz/week    3 Standard drinks or equivalent per week     Comment: occasuional     Allergies   Review of patient's allergies indicates no known allergies.   Review of  Systems Review of Systems  Constitutional: Positive for fatigue.  Gastrointestinal: Positive for abdominal pain, blood in stool and diarrhea.  Neurological: Positive for weakness.  All other systems reviewed and are negative.    Physical Exam Updated Vital Signs BP 100/75   Pulse 73   Temp 97.6 F (36.4 C) (Oral)   Resp 16   LMP  (Exact Date)  SpO2 100%   Physical Exam  Constitutional: She is oriented to person, place, and time. She appears well-developed and well-nourished.  HENT:  Head: Normocephalic and atraumatic.  Right Ear: External ear normal.  Left Ear: External ear normal.  Nose: Nose normal.  Mouth/Throat: Oropharynx is clear and moist.  Eyes: EOM are normal. Pupils are equal, round, and reactive to light.  Neck: Normal range of motion. Neck supple.  Cardiovascular: Normal rate, regular rhythm, normal heart sounds and intact distal pulses.   Pulmonary/Chest: Effort normal and breath sounds normal.  Abdominal: Soft. Bowel sounds are normal.  Genitourinary: Rectal exam shows guaiac positive stool.     Musculoskeletal: Normal range of motion.  Neurological: She is alert and oriented to person, place, and time.  Skin: Skin is warm. There is pallor.  Psychiatric: She has a normal mood and affect. Her behavior is normal. Judgment and thought content normal.  Nursing note and vitals reviewed.    ED Treatments / Results  Labs (all labs ordered are listed, but only abnormal results are displayed) Labs Reviewed  COMPREHENSIVE METABOLIC PANEL - Abnormal; Notable for the following:       Result Value   CO2 16 (*)    Glucose, Bld 134 (*)    BUN 46 (*)    Creatinine, Ser 1.79 (*)    Calcium 8.0 (*)    Total Protein 4.9 (*)    Albumin 2.5 (*)    ALT 10 (*)    GFR calc non Af Amer 27 (*)    GFR calc Af Amer 31 (*)    All other components within normal limits  CBC WITH DIFFERENTIAL/PLATELET - Abnormal; Notable for the following:    RBC 1.85 (*)    Hemoglobin  5.1 (*)    HCT 16.9 (*)    RDW 19.6 (*)    All other components within normal limits  PROTIME-INR - Abnormal; Notable for the following:    Prothrombin Time 15.3 (*)    All other components within normal limits  POC OCCULT BLOOD, ED - Abnormal; Notable for the following:    Fecal Occult Bld POSITIVE (*)    All other components within normal limits  LACTIC ACID, PLASMA  LIPASE, BLOOD  URINALYSIS, ROUTINE W REFLEX MICROSCOPIC (NOT AT [facility])  I-STAT TROPOININ, ED  TYPE AND SCREEN  PREPARE RBC (CROSSMATCH)    EKG  EKG Interpretation  Date/Time:  Saturday October 18 2015 13:42:39 EDT Ventricular Rate:  69 PR Interval:    QRS Duration: 88 QT Interval:  410 QTC Calculation: 440 R Axis:   78 Text Interpretation:  Sinus rhythm Repol abnrm suggests ischemia, diffuse leads st depression inferiorally and laterally Confirmed by [facility] MD, Yanelli Zapanta ([id number]) on 10/18/2015 2:38:49 PM       Radiology Dg Chest Portable 1 View  Result Date: 10/18/2015 CLINICAL DATA:  Dark stool as well as stool with bright red blood x2 days. Hx of cholecystectomy on September 05, 2015. Pt denies any chest and abdominal pain at this time. Pt denies SOB and vomiting. Hx of CABG, CHF, DM, HTN, CKD EXAM: PORTABLE CHEST 1 VIEW COMPARISON:  09/03/2015 FINDINGS: There is focal opacity at the left lung base. This is consistent with pneumonia in the proper clinical setting. It may be due to atelectasis. There lungs are hyperexpanded but otherwise clear. Cardiac silhouette is normal in size. There stable changes from previous CABG surgery. No mediastinal or hilar masses or evidence of adenopathy. No pleural effusion or pneumothorax. IMPRESSION: Left  lung base opacity consistent with pneumonia or atelectasis. Electronically Signed   By: Lajean Manes M.D.   On: 10/18/2015 14:17    Procedures Procedures (including critical care time)  Medications Ordered in ED Medications  sodium chloride 0.9 % bolus 1,000 mL (1,000 mLs  Intravenous New Bag/Given 10/18/15 1421)    And  0.9 %  sodium chloride infusion (not administered)  0.9 %  sodium chloride infusion (not administered)  pantoprazole (PROTONIX) 80 mg in sodium chloride 0.9 % 250 mL (0.32 mg/mL) infusion (not administered)  pantoprazole (PROTONIX) injection 40 mg (40 mg Intravenous Given 10/18/15 1421)     Initial Impression / Assessment and Plan / ED Course  I have reviewed the triage vital signs and the nursing notes.  Pertinent labs & imaging results that were available during my care of the patient were reviewed by me and considered in my medical decision making (see chart for details).  Clinical Course  Pt d/w the hospitalist for admission.  They will admit.  They requested protonix drip.  Transfusion was also started for pt in the ED.  Pt has seen Dr. Hilarie Fredrickson with [facility], so I will contact [place].  Pt d/w Dr. Carlean Purl and he will consult on patient.  Final Clinical Impressions(s) / ED Diagnoses   Final diagnoses:  Acute GI bleeding  Symptomatic anemia  CRI (chronic renal insufficiency), unspecified stage    New Prescriptions New Prescriptions   No medications on file     Isla Pence, MD 10/19/15 [phone]

## 2015-10-19 ENCOUNTER — Encounter (HOSPITAL_COMMUNITY)
Admission: EM | Disposition: A | Discharge status: Home Health | Payer: Self-pay | Source: Home / Self Care | Attending: Internal Medicine

## 2015-10-19 ENCOUNTER — Encounter (HOSPITAL_COMMUNITY): Payer: Self-pay | Admitting: *Deleted

## 2015-10-19 HISTORY — PX: ESOPHAGOGASTRODUODENOSCOPY: SHX5428

## 2015-10-19 LAB — CBC
HEMATOCRIT: 24.3 % — AB (ref 36.0–46.0)
HEMATOCRIT: 31.5 % — AB (ref 36.0–46.0)
HEMOGLOBIN: 7.7 g/dL — AB (ref 12.0–15.0)
Hemoglobin: 9.6 g/dL — ABNORMAL LOW (ref 12.0–15.0)
MCH: 28.1 pg (ref 26.0–34.0)
MCH: 27.7 pg (ref 26.0–34.0)
MCHC: 31.7 g/dL (ref 30.0–36.0)
MCHC: 30.5 g/dL (ref 30.0–36.0)
MCV: 88.7 fL (ref 78.0–100.0)
MCV: 91 fL (ref 78.0–100.0)
PLATELETS: 340 10*3/uL (ref 150–400)
Platelets: 261 10*3/uL (ref 150–400)
RBC: 2.74 MIL/uL — ABNORMAL LOW (ref 3.87–5.11)
RBC: 3.46 MIL/uL — ABNORMAL LOW (ref 3.87–5.11)
RDW: 17.4 % — ABNORMAL HIGH (ref 11.5–15.5)
RDW: 18.5 % — AB (ref 11.5–15.5)
WBC: 9.1 10*3/uL (ref 4.0–10.5)
WBC: 10.2 10*3/uL (ref 4.0–10.5)

## 2015-10-19 LAB — GLUCOSE, CAPILLARY
GLUCOSE-CAPILLARY: 103 mg/dL — AB (ref 65–99)
GLUCOSE-CAPILLARY: 103 mg/dL — AB (ref 65–99)
GLUCOSE-CAPILLARY: 230 mg/dL — AB (ref 65–99)
Glucose-Capillary: 125 mg/dL — ABNORMAL HIGH (ref 65–99)

## 2015-10-19 LAB — BASIC METABOLIC PANEL
ANION GAP: 9 (ref 5–15)
BUN: 39 mg/dL — ABNORMAL HIGH (ref 6–20)
CALCIUM: 7.7 mg/dL — AB (ref 8.9–10.3)
CHLORIDE: 112 mmol/L — AB (ref 101–111)
CO2: 17 mmol/L — AB (ref 22–32)
Creatinine, Ser: 1.57 mg/dL — ABNORMAL HIGH (ref 0.44–1.00)
GFR calc Af Amer: 36 mL/min — ABNORMAL LOW (ref >60)
GFR calc non Af Amer: 31 mL/min — ABNORMAL LOW (ref >60)
GLUCOSE: 100 mg/dL — AB (ref 65–99)
Potassium: 4.7 mmol/L (ref 3.5–5.1)
Sodium: 138 mmol/L (ref 135–145)

## 2015-10-19 SURGERY — EGD (ESOPHAGOGASTRODUODENOSCOPY)
Anesthesia: Moderate Sedation

## 2015-10-19 MED ORDER — MIDAZOLAM HCL 10 MG/2ML IJ SOLN
INTRAMUSCULAR | Status: DC | PRN
  Administered 2015-10-19: 2 mg via INTRAVENOUS
  Administered 2015-10-19: 1 mg via INTRAVENOUS

## 2015-10-19 MED ORDER — FENTANYL CITRATE (PF) 100 MCG/2ML IJ SOLN
INTRAMUSCULAR | Status: DC | PRN
  Administered 2015-10-19: 25 ug via INTRAVENOUS

## 2015-10-19 MED ORDER — POLYETHYLENE GLYCOL 3350 17 GM/SCOOP PO POWD
1.0000 | Freq: Once | ORAL | Status: AC
  Administered 2015-10-19: 255 g via ORAL
  Filled 2015-10-19 (×2): qty 255

## 2015-10-19 MED ORDER — LACTATED RINGERS IV SOLN: INTRAVENOUS | Status: DC

## 2015-10-19 MED ORDER — MIDAZOLAM HCL 5 MG/ML IJ SOLN
INTRAMUSCULAR | Status: AC
  Filled 2015-10-19: qty 2

## 2015-10-19 MED ORDER — FENTANYL CITRATE (PF) 100 MCG/2ML IJ SOLN
INTRAMUSCULAR | Status: AC
  Filled 2015-10-19: qty 2

## 2015-10-19 MED ORDER — PANTOPRAZOLE SODIUM 40 MG PO TBEC
40.0000 mg | DELAYED_RELEASE_TABLET | Freq: Every day | ORAL | Status: DC
  Administered 2015-10-20 – 2015-10-22 (×3): 40 mg via ORAL
  Filled 2015-10-19 (×3): qty 1

## 2015-10-19 MED ORDER — BUTAMBEN-TETRACAINE-BENZOCAINE 2-2-14 % EX AERO
INHALATION_SPRAY | CUTANEOUS | Status: DC | PRN
  Administered 2015-10-19: 2 via TOPICAL

## 2015-10-19 MED ORDER — EPINEPHRINE HCL 0.1 MG/ML IJ SOSY
PREFILLED_SYRINGE | INTRAMUSCULAR | Status: AC
  Filled 2015-10-19: qty 10

## 2015-10-19 MED ORDER — DIPHENHYDRAMINE HCL 50 MG/ML IJ SOLN
INTRAMUSCULAR | Status: AC
  Filled 2015-10-19: qty 1

## 2015-10-19 MED ORDER — SODIUM CHLORIDE 0.9 % IV SOLN: INTRAVENOUS | Status: DC

## 2015-10-19 MED ORDER — METOCLOPRAMIDE HCL 5 MG/ML IJ SOLN: 10.0000 mg | Freq: Once | INTRAMUSCULAR | Status: DC

## 2015-10-19 MED ORDER — BISACODYL 5 MG PO TBEC
20.0000 mg | DELAYED_RELEASE_TABLET | Freq: Once | ORAL | Status: AC
  Administered 2015-10-19: 20 mg via ORAL
  Filled 2015-10-19: qty 4

## 2015-10-19 MED ORDER — POLYETHYLENE GLYCOL 3350 17 GM/SCOOP PO POWD
0.5000 | Freq: Once | ORAL | Status: AC
  Administered 2015-10-19: 127.5 g via ORAL
  Filled 2015-10-19: qty 255

## 2015-10-19 MED ORDER — SPOT INK MARKER SYRINGE KIT
PACK | SUBMUCOSAL | Status: AC
  Filled 2015-10-19: qty 5

## 2015-10-19 NOTE — Progress Notes (Signed)
PROGRESS NOTE    [name] [name]  [id number] DOB: [birth year] DOA: 10/18/2015 PCP: [name], MD Brief Narrative: [name] [name] is a 75 y.o. female with multiple medical problems into limited to GERD , chronic kidney disease, coronary artery disease, hypertension, congestive heart failure, and type 2 diabetes problems.. Patient presented by EMS to the emergency department for weakness. She was found to be heme positive with a hb of 5.1 down from baseline of 8-9. Patient is on Plavix, she takes a baby aspirin every day. Gi consulted, EGD negative, colonoscopy tomorrow  Assessment & Plan: GI bleed/Acute blood loss anemia -heme positive  -continue PPI -EGD negative -ASA/plavix on hold -plan for colonoscopy tomorrow  Acute blood loss anemia -Baseline hemoglobin mid 8 to mid 9 range.  -Hb 5.1 on admission -s/p 2 units PRBC     AKI superimposed on CKD 3 . Baseline Cr 1.39, up to 1.79  -improving -back to baseline  Abnormal CXR -CXr with left lung base pneumonia versus atelectasis.  No coughing, no respiratory distress. Normal white count. .Will hold off on Antibiotics, monitor for now  DM2.  Followed outpatient by Endocrine -hold home insulin -Stable, SSI  Hx of diastolic dysfunction, grade 2 on June 2017 echo. EF preserved at 55% -stop IVF, monitor -euvolemic at this time  Hypothyroidism.  . -Continue home Synthroid  CAD, remote CABG -stable, hold ASA/plavix, continue toprol/statin -followed by Dr.Berry -last stress test in 9/13 was non ischemic, on medical management per Cards  PAD -s/p Bilateral CEA, aortobifem by pass and R fem pop bypass -hold ASA/Plavix at this time  H/o retroperitoneal fibrosis and R hydronephrosis -s/p R ureteral stents followed by Dr.Herrick, exchanging q 46months  DVT prophylaxis:   SCDs  Code Status:     Full code  Family Communication: None at bedside  Disposition Plan:   Discharge home in 24-48 hours         Consultants:    [facility]   Procedures: EGD 8/13: normal   Subjective: Feels better after blood, no other issues  Objective: Vitals:   10/19/15 0910 10/19/15 0920 10/19/15 1118 10/19/15 1130  BP: (!) 152/43  (!) 138/53   Pulse: (!) 59  (!) 57   Resp: 17     Temp:    98.1 F (36.7 C)  TempSrc:    Oral  SpO2: 99% 94%  97%  Weight:      Height:        Intake/Output Summary (Last 24 hours) at 10/19/15 1157 Last data filed at 10/19/15 0549  Gross per 24 hour  Intake             1245 ml  Output             1000 ml  Net              245 ml   Filed Weights   10/18/15 1713 10/19/15 0341  Weight: 63 kg (138 lb 12.8 oz) 66.3 kg (146 lb 1.6 oz)    Examination:  General exam: Appears calm and comfortable  Respiratory system: Clear to auscultation. Respiratory effort normal. Cardiovascular system: S1 & S2 heard, RRR. No JVD, murmurs, rubs, gallops or clicks. No pedal edema. Gastrointestinal system: Abdomen is nondistended, soft and nontender. No organomegaly or masses felt. Normal bowel sounds heard. Central nervous system: Alert and oriented. No focal neurological deficits. Extremities: Symmetric 5 x 5 power. Skin: No rashes, lesions or ulcers Psychiatry: Judgement and insight appear normal. Mood & affect  appropriate.     Data Reviewed: I have personally reviewed following labs and imaging studies  CBC:  Recent Labs Lab 10/18/15 1345 10/19/15 0121  WBC 9.8 9.1  NEUTROABS 7.5  --   HGB 5.1* 7.7*  HCT 16.9* 24.3*  MCV 91.4 88.7  PLT 292 0000000   Basic Metabolic Panel:  Recent Labs Lab 10/18/15 1345 10/19/15 0121  NA 135 138  K 4.3 4.7  CL 109 112*  CO2 16* 17*  GLUCOSE 134* 100*  BUN 46* 39*  CREATININE 1.79* 1.57*  CALCIUM 8.0* 7.7*   GFR: Estimated Creatinine Clearance: 29.4 mL/min (by C-G formula based on SCr of 1.57 mg/dL). Liver Function Tests:  Recent Labs Lab 10/18/15 1345  AST 16  ALT 10*  ALKPHOS 61  BILITOT 0.4  PROT 4.9*  ALBUMIN 2.5*    Recent  Labs Lab 10/18/15 1345  LIPASE 16   No results for input(s): AMMONIA in the last 168 hours. Coagulation Profile:  Recent Labs Lab 10/18/15 1345  INR 1.20   Cardiac Enzymes: No results for input(s): CKTOTAL, CKMB, CKMBINDEX, TROPONINI in the last 168 hours. BNP (last 3 results) No results for input(s): PROBNP in the last 8760 hours. HbA1C: No results for input(s): HGBA1C in the last 72 hours. CBG:  Recent Labs Lab 10/18/15 1712 10/18/15 2049 10/19/15 0725 10/19/15 1110  GLUCAP 118* 151* 103* 103*   Lipid Profile: No results for input(s): CHOL, HDL, LDLCALC, TRIG, CHOLHDL, LDLDIRECT in the last 72 hours. Thyroid Function Tests: No results for input(s): TSH, T4TOTAL, FREET4, T3FREE, THYROIDAB in the last 72 hours. Anemia Panel: No results for input(s): VITAMINB12, FOLATE, FERRITIN, TIBC, IRON, RETICCTPCT in the last 72 hours. Urine analysis:    Component Value Date/Time   COLORURINE YELLOW 10/01/2015 1630   APPEARANCEUR CLEAR 10/01/2015 1630   LABSPEC 1.020 10/01/2015 1630   PHURINE 5.5 10/01/2015 1630   GLUCOSEU NEGATIVE 10/01/2015 1630   HGBUR LARGE (A) 10/01/2015 1630   BILIRUBINUR NEGATIVE 10/01/2015 1630   KETONESUR NEGATIVE 10/01/2015 1630   PROTEINUR >300 (A) 08/28/2015 1812   UROBILINOGEN 0.2 10/01/2015 1630   NITRITE NEGATIVE 10/01/2015 1630   LEUKOCYTESUR LARGE (A) 10/01/2015 1630   Sepsis Labs: @LABRCNTIP (procalcitonin:4,lacticidven:4)  )No results found for this or any previous visit (from the past 240 hour(s)).       Radiology Studies: Dg Chest Portable 1 View  Result Date: 10/18/2015 CLINICAL DATA:  Dark stool as well as stool with bright red blood x2 days. Hx of cholecystectomy on September 05, 2015. Pt denies any chest and abdominal pain at this time. Pt denies SOB and vomiting. Hx of CABG, CHF, DM, HTN, CKD EXAM: PORTABLE CHEST 1 VIEW COMPARISON:  09/03/2015 FINDINGS: There is focal opacity at the left lung base. This is consistent with  pneumonia in the proper clinical setting. It may be due to atelectasis. There lungs are hyperexpanded but otherwise clear. Cardiac silhouette is normal in size. There stable changes from previous CABG surgery. No mediastinal or hilar masses or evidence of adenopathy. No pleural effusion or pneumothorax. IMPRESSION: Left lung base opacity consistent with pneumonia or atelectasis. Electronically Signed   By: Lajean Manes M.D.   On: 10/18/2015 14:17        Scheduled Meds: . bisacodyl  20 mg Oral Once  . gabapentin  300 mg Oral TID  . latanoprost  1 drop Both Eyes QHS  . levothyroxine  125 mcg Oral QAC breakfast  . metoCLOPramide (REGLAN) injection  10 mg Intravenous Once  .  metoCLOPramide (REGLAN) injection  10 mg Intravenous Once  . metoprolol succinate  12.5 mg Oral Daily  . [START ON 10/20/2015] pantoprazole  40 mg Oral QAC breakfast  . polyethylene glycol powder  0.5 Container Oral Once  . polyethylene glycol powder  1 Container Oral Once  . rosuvastatin  20 mg Oral q1800   Continuous Infusions: . [START ON 10/20/2015] lactated ringers       LOS: 1 day    Time spent: 85min    Domenic Polite, MD [facility] Pager [phone]  If 7PM-7AM, please contact night-coverage [email or web address] Password [facility] 10/19/2015, 11:57 AM

## 2015-10-19 NOTE — Brief Op Note (Signed)
10/18/2015 - 10/19/2015  9:29 AM  PATIENT:  [name] [name]  75 y.o. female  PRE-OPERATIVE DIAGNOSIS:  GI bleed  POST-OPERATIVE DIAGNOSIS:  normal EGD  PROCEDURE:  Procedure(s): ESOPHAGOGASTRODUODENOSCOPY (EGD) (N/A)  SURGEON:  Surgeon(s) and Role:    * Gatha Mayer, MD - Primary  PHYSICIAN ASSISTANT: ANESTHESIA:   IV sedation fentanyl and Versed  Normal egd WILL NEED COLONOSCOPY TOMORROW

## 2015-10-19 NOTE — Op Note (Signed)
[facility] Patient Name: [name] [name] Procedure Date : 10/19/2015 MRN: [id number] Attending MD: Gatha Mayer , MD Date of Birth: 08-15-1940 CSN: [id number] Age: 75 Admit Type: Inpatient Procedure:                Upper GI endoscopy Indications:              Iron deficiency anemia, Melena Providers:                Gatha Mayer, MD, Kingsley Plan, RN, Corliss Parish, Technician Referring MD:              Medicines:                Fentanyl 25 micrograms IV, Midazolam 3 mg IV,                            Cetacaine spray Complications:            No immediate complications. Estimated Blood Loss:     Estimated blood loss: none. Procedure:                Pre-Anesthesia Assessment:                           - Prior to the procedure, a History and Physical                            was performed, and patient medications and                            allergies were reviewed. The patient's tolerance of                            previous anesthesia was also reviewed. The risks                            and benefits of the procedure and the sedation                            options and risks were discussed with the patient.                            All questions were answered, and informed consent                            was obtained. Prior Anticoagulants: The patient                            last took aspirin 1 day and Plavix (clopidogrel) 1                            day prior to the procedure. ASA Grade Assessment:  III - A patient with severe systemic disease. After                            reviewing the risks and benefits, the patient was                            deemed in satisfactory condition to undergo the                            procedure.                           After obtaining informed consent, the endoscope was                            passed under direct vision. Throughout the                       procedure, the patient's blood pressure, pulse, and                            oxygen saturations were monitored continuously. The                            [id number]) scope was introduced through the                            mouth, and advanced to the second part of duodenum.                            The upper GI endoscopy was accomplished without                            difficulty. The patient tolerated the procedure                            well. Scope In: Scope Out: Findings:      The esophagus was normal.      The stomach was normal.      The examined duodenum was normal.      The cardia and gastric fundus were normal on retroflexion. Impression:               - Normal esophagus.                           - Normal stomach.                           - Normal examined duodenum.                           - No specimens collected. Moderate Sedation:      Moderate (conscious) sedation was administered by the endoscopy nurse       and supervised by the endoscopist. The following parameters were       monitored: oxygen saturation, heart rate, blood pressure, respiratory  rate, EKG, adequacy of pulmonary ventilation, and response to care.       Total physician intraservice time was 9 minutes. Recommendation:           - Return patient to hospital ward for ongoing care.                           - Clear liquid diet today.                           - Perform a colonoscopy tomorrow.                           - Continue present medications. Procedure Code(s):        --- Professional ---                           [phone], Esophagogastroduodenoscopy, flexible,                            transoral; diagnostic, including collection of                            specimen(s) by brushing or washing, when performed                            (separate procedure) Diagnosis Code(s):        --- Professional ---                           D50.9, Iron deficiency  anemia, unspecified                           K92.1, Melena (includes Hematochezia) CPT copyright 2016 [employer]. All rights reserved. The codes documented in this report are preliminary and upon coder review may  be revised to meet current compliance requirements. Gatha Mayer, MD 10/19/2015 10:17:28 AM This report has been signed electronically. Number of Addenda: 0

## 2015-10-19 NOTE — Anesthesia Preprocedure Evaluation (Addendum)
Anesthesia Evaluation  Patient identified by MRN, date of birth, ID band Patient awake    Reviewed: Allergy & Precautions, NPO status , Patient's Chart, lab work & pertinent test results, reviewed documented beta blocker date and time   Airway Mallampati: II  TM Distance: >3 FB Neck ROM: Full    Dental   Pulmonary shortness of breath and with exertion, former smoker,    breath sounds clear to auscultation       Cardiovascular Exercise Tolerance: Poor hypertension, Pt. on medications and Pt. 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, occluded right fem-pop bypass CABG 1997 EKG- NSR with repolarization abnormality suggesting ischemia ant/inf/lat walls Echo- LVEF 55% improved from 2011 15% Diffuse hypokinesia, Grade 2 diastolic dysfunction   Neuro/Psych PSYCHIATRIC DISORDERS    GI/Hepatic GERD  Medicated and Controlled,Melena  GI bleed Hx/o Severe Protein calorie malnutrition   Endo/Other  diabetes, Poorly Controlled, Type 2, Insulin DependentHypothyroidism Hyperlipidemia  Renal/GU Renal diseaseCKD stage III  negative genitourinary   Musculoskeletal  (+) Arthritis ,   Abdominal   Peds  Hematology  (+) anemia ,   Anesthesia Other Findings   Reproductive/Obstetrics                           Anesthesia Physical Anesthesia Plan  ASA: III  Anesthesia Plan: MAC   Post-op Pain Management:    Induction: Intravenous  Airway Management Planned: Natural Airway, Simple Face Mask and Nasal Cannula  Additional Equipment:   Intra-op Plan:   Post-operative Plan:   Informed Consent: I have reviewed the patients History and Physical, chart, labs and discussed the procedure including the risks, benefits and alternatives for  the proposed anesthesia with the patient or authorized representative who has indicated his/her understanding and acceptance.     Plan Discussed with:   Anesthesia Plan Comments:         Anesthesia Quick Evaluation

## 2015-10-20 ENCOUNTER — Encounter (HOSPITAL_COMMUNITY): Payer: Self-pay | Admitting: Certified Registered Nurse Anesthetist

## 2015-10-20 LAB — GLUCOSE, CAPILLARY
GLUCOSE-CAPILLARY: 117 mg/dL — AB (ref 65–99)
GLUCOSE-CAPILLARY: 102 mg/dL — AB (ref 65–99)
Glucose-Capillary: 113 mg/dL — ABNORMAL HIGH (ref 65–99)
Glucose-Capillary: 102 mg/dL — ABNORMAL HIGH (ref 65–99)

## 2015-10-20 LAB — BASIC METABOLIC PANEL
Anion gap: 6 (ref 5–15)
BUN: 25 mg/dL — AB (ref 6–20)
CHLORIDE: 117 mmol/L — AB (ref 101–111)
CO2: 17 mmol/L — AB (ref 22–32)
CREATININE: 1.34 mg/dL — AB (ref 0.44–1.00)
Calcium: 8 mg/dL — ABNORMAL LOW (ref 8.9–10.3)
GFR calc Af Amer: 44 mL/min — ABNORMAL LOW (ref >60)
GFR calc non Af Amer: 38 mL/min — ABNORMAL LOW (ref >60)
Glucose, Bld: 107 mg/dL — ABNORMAL HIGH (ref 65–99)
POTASSIUM: 3.9 mmol/L (ref 3.5–5.1)
Sodium: 140 mmol/L (ref 135–145)

## 2015-10-20 LAB — CBC
HEMATOCRIT: 26.7 % — AB (ref 36.0–46.0)
HEMOGLOBIN: 8.4 g/dL — AB (ref 12.0–15.0)
MCH: 28.3 pg (ref 26.0–34.0)
MCHC: 31.5 g/dL (ref 30.0–36.0)
MCV: 89.9 fL (ref 78.0–100.0)
Platelets: 289 10*3/uL (ref 150–400)
RBC: 2.97 MIL/uL — ABNORMAL LOW (ref 3.87–5.11)
RDW: 18.2 % — ABNORMAL HIGH (ref 11.5–15.5)
WBC: 7.1 10*3/uL (ref 4.0–10.5)

## 2015-10-20 MED ORDER — PEG-KCL-NACL-NASULF-NA ASC-C 100 G PO SOLR
1.0000 | Freq: Once | ORAL | Status: AC
  Administered 2015-10-20: 200 g via ORAL
  Filled 2015-10-20 (×2): qty 1

## 2015-10-20 MED ORDER — METOCLOPRAMIDE HCL 5 MG/ML IJ SOLN
5.0000 mg | Freq: Once | INTRAMUSCULAR | Status: AC
  Administered 2015-10-20: 5 mg via INTRAVENOUS
  Filled 2015-10-20: qty 2

## 2015-10-20 MED ORDER — METOCLOPRAMIDE HCL 5 MG/ML IJ SOLN
5.0000 mg | Freq: Once | INTRAMUSCULAR | Status: AC
  Administered 2015-10-21: 5 mg via INTRAVENOUS
  Filled 2015-10-20: qty 2

## 2015-10-20 MED ORDER — PEG-KCL-NACL-NASULF-NA ASC-C 100 G PO SOLR
1.0000 | Freq: Once | ORAL | Status: AC
  Administered 2015-10-20: 200 g via ORAL
  Filled 2015-10-20: qty 1

## 2015-10-20 NOTE — Progress Notes (Signed)
PROGRESS NOTE    [name] [name]  [id number] DOB: [birth year] DOA: 10/18/2015 PCP: [name], MD Brief Narrative: GLENDENE [name] is a 75 y.o. female with multiple medical problems into limited to GERD , chronic kidney disease, coronary artery disease, hypertension, congestive heart failure, and type 2 diabetes problems.. Patient presented by EMS to the emergency department for weakness. She was found to be heme positive with a hb of 5.1 down from baseline of 8-9. Patient is on Plavix, she takes a baby aspirin every day. Gi consulted, EGD negative, colonoscopy tomorrow  Assessment & Plan: GI bleed/Acute blood loss anemia -heme positive  -continue PPI -EGD negative -ASA/plavix on hold -plan for colonoscopy tomorrow, postponed due to inadequate prep  Acute blood loss anemia -Baseline hemoglobin mid 8 to mid 9 range.  -Hb 5.1 on admission -s/p 2 units PRBC, now Hb is 8.4 -iron defi anemia noted from 10/01/15     AKI superimposed on CKD 3 . Baseline Cr 1.39, up to 1.79  -improving -back to baseline  Abnormal CXR -CXr with left lung base pneumonia versus atelectasis.  No coughing, no respiratory distress. Normal white count. .Will hold off on Antibiotics, monitor for now  DM2.  Followed outpatient by Endocrine -hold home insulin -Stable, SSI  Hx of diastolic dysfunction, grade 2 on June 2017 echo. EF preserved at 55% -stop IVF, monitor -euvolemic at this time  Hypothyroidism.  . -Continue home Synthroid  CAD, remote CABG -stable, hold ASA/plavix, continue toprol/statin -followed by Dr.Berry -last stress test in 9/13 was non ischemic, on medical management per Cards  PAD -s/p Bilateral CEA, aortobifem by pass and R fem pop bypass -hold ASA/Plavix at this time  H/o retroperitoneal fibrosis and R hydronephrosis -s/p R ureteral stents followed by Dr.Herrick, exchanging q 58month  DVT prophylaxis:   SCDs  Code Status:     Full code  Family Communication:  None at bedside  Disposition Plan:   Discharge home in 24-48 hours         Consultants:   [facility]   Procedures: EGD 8/13: normal   Subjective: No complaints, feels ok  Objective: Vitals:   10/20/15 0433 10/20/15 0800 10/20/15 0818 10/20/15 1213  BP: (!) 154/46  (!) 163/48   Pulse: (!) 57 (!) 53 (!) 56 (!) 58  Resp: _0 Temp: 98.2 F (36.8 C) 98.5 F (36.9 C)  98.1 F (36.7 C)  TempSrc: Oral Oral  Oral  SpO2: 97% 98%  97%  Weight: 65 kg (143 lb 3.2 oz)     Height:        Intake/Output Summary (Last 24 hours) at 10/20/15 1454 Last data filed at 10/20/15 1022  Gross per 24 hour  Intake              900 ml  Output              350 ml  Net              550 ml   Filed Weights   10/18/15 1713 10/19/15 0341 10/20/15 0433  Weight: 63 kg (138 lb 12.8 oz) 66.3 kg (146 lb 1.6 oz) 65 kg (143 lb 3.2 oz)    Examination:  General exam: Appears calm and comfortable  Respiratory system: Clear to auscultation. Respiratory effort normal. Cardiovascular system: S1 & S2 heard, RRR. No JVD, murmurs, rubs, gallops or clicks. No pedal edema. Gastrointestinal system: Abdomen is nondistended, soft and nontender. No organomegaly or masses felt.  Normal bowel sounds heard. Central nervous system: Alert and oriented. No focal neurological deficits. Extremities: Symmetric 5 x 5 power. Skin: No rashes, lesions or ulcers Psychiatry: Judgement and insight appear normal. Mood & affect appropriate.     Data Reviewed: I have personally reviewed following labs and imaging studies  CBC:  Recent Labs Lab 10/18/15 1345 10/19/15 0121 10/19/15 2047 10/20/15 0548  WBC 9.8 9.1 10.2 7.1  NEUTROABS 7.5  --   --   --   HGB 5.1* 7.7* 9.6* 8.4*  HCT 16.9* 24.3* 31.5* 26.7*  MCV 91.4 88.7 91.0 89.9  PLT 292 261 340 333   Basic Metabolic Panel:  Recent Labs Lab 10/18/15 1345 10/19/15 0121 10/20/15 0548  NA 135 138 140  K 4.3 4.7 3.9  CL 109 112* 117*  CO2 16* 17* 17*  GLUCOSE 134*  100* 107*  BUN 46* 39* 25*  CREATININE 1.79* 1.57* 1.34*  CALCIUM 8.0* 7.7* 8.0*   GFR: Estimated Creatinine Clearance: 34.5 mL/min (by C-G formula based on SCr of 1.34 mg/dL). Liver Function Tests:  Recent Labs Lab 10/18/15 1345  AST 16  ALT 10*  ALKPHOS 61  BILITOT 0.4  PROT 4.9*  ALBUMIN 2.5*    Recent Labs Lab 10/18/15 1345  LIPASE 16   No results for input(s): AMMONIA in the last 168 hours. Coagulation Profile:  Recent Labs Lab 10/18/15 1345  INR 1.20   Cardiac Enzymes: No results for input(s): CKTOTAL, CKMB, CKMBINDEX, TROPONINI in the last 168 hours. BNP (last 3 results) No results for input(s): PROBNP in the last 8760 hours. HbA1C: No results for input(s): HGBA1C in the last 72 hours. CBG:  Recent Labs Lab 10/19/15 1110 10/19/15 1643 10/19/15 2027 10/20/15 0720 10/20/15 1107  GLUCAP 103* 125* 230* 113* 117*   Lipid Profile: No results for input(s): CHOL, HDL, LDLCALC, TRIG, CHOLHDL, LDLDIRECT in the last 72 hours. Thyroid Function Tests: No results for input(s): TSH, T4TOTAL, FREET4, T3FREE, THYROIDAB in the last 72 hours. Anemia Panel: No results for input(s): VITAMINB12, FOLATE, FERRITIN, TIBC, IRON, RETICCTPCT in the last 72 hours. Urine analysis:    Component Value Date/Time   COLORURINE YELLOW 10/01/2015 1630   APPEARANCEUR CLEAR 10/01/2015 1630   LABSPEC 1.020 10/01/2015 1630   PHURINE 5.5 10/01/2015 1630   GLUCOSEU NEGATIVE 10/01/2015 1630   HGBUR LARGE (A) 10/01/2015 1630   BILIRUBINUR NEGATIVE 10/01/2015 1630   KETONESUR NEGATIVE 10/01/2015 1630   PROTEINUR >300 (A) 08/28/2015 1812   UROBILINOGEN 0.2 10/01/2015 1630   NITRITE NEGATIVE 10/01/2015 1630   LEUKOCYTESUR LARGE (A) 10/01/2015 1630   Sepsis Labs: _0 (procalcitonin:4,lacticidven:4)  )No results found for this or any previous visit (from the past 240 hour(s)).       Radiology Studies: No results found.      Scheduled Meds: . gabapentin  300 mg Oral  TID  . latanoprost  1 drop Both Eyes QHS  . levothyroxine  125 mcg Oral QAC breakfast  . metoCLOPramide (REGLAN) injection  5 mg Intravenous Once   Followed by  . [START ON 10/21/2015] metoCLOPramide (REGLAN) injection  5 mg Intravenous Once  . metoprolol succinate  12.5 mg Oral Daily  . pantoprazole  40 mg Oral QAC breakfast  . peg 3350 powder  1 kit Oral Once  . rosuvastatin  20 mg Oral q1800   Continuous Infusions:     LOS: 2 days    Time spent: 47mn    PDomenic Polite MD [facility] Pager [phone] If 7PM-7AM, please contact  night-coverage [email or web address] Password [email or web address] 10/20/2015, 2:54 PM

## 2015-10-20 NOTE — Progress Notes (Signed)
Daily Rounding Note  10/20/2015, 10:13 AM  LOS: 2 days   SUBJECTIVE:   Chief complaint: diarrhea Loose stools during bowel prep.  Completed Miralax prep but not clear and blood noted in stool.  Pt not sure if blood ws from urine but staff reports it was fecal blood. She has chronic hematuria  OBJECTIVE:         Vital signs in last 24 hours:    Temp:  [97.4 F (36.3 C)-98.8 F (37.1 C)] 98.5 F (36.9 C) (08/14 0800) Pulse Rate:  [53-62] 56 (08/14 0818) Resp:  [18-22] 19 (08/14 0800) BP: (138-167)/(46-55) 163/48 (08/14 0818) SpO2:  [97 %-100 %] 98 % (08/14 0800) Weight:  [65 kg (143 lb 3.2 oz)] 65 kg (143 lb 3.2 oz) (08/14 0433) Last BM Date: 10/19/15 Filed Weights   10/18/15 1713 10/19/15 0341 10/20/15 0433  Weight: 63 kg (138 lb 12.8 oz) 66.3 kg (146 lb 1.6 oz) 65 kg (143 lb 3.2 oz)   General: pleasant, looks chronically ill.  comfortable   Heart: RRR Chest: clear bil.  No cough or SOB Abdomen: soft, NT, ND.   Rectal:  Empty of stool, no masses or blood  Extremities: no CCE Neuro/Psych:  Pleasant, oriented x 3 and fully alert.  Calm.  No limb weakness or tremor.   Intake/Output from previous day: 08/13 0701 - 08/14 0700 In: 660 [P.O.:660] Out: 550 [Urine:550]  Intake/Output this shift: No intake/output data recorded.  Lab Results:  Recent Labs  10/19/15 0121 10/19/15 2047 10/20/15 0548  WBC 9.1 10.2 7.1  HGB 7.7* 9.6* 8.4*  HCT 24.3* 31.5* 26.7*  PLT 261 340 289   BMET  Recent Labs  10/18/15 1345 10/19/15 0121 10/20/15 0548  NA 135 138 140  K 4.3 4.7 3.9  CL 109 112* 117*  CO2 16* 17* 17*  GLUCOSE 134* 100* 107*  BUN 46* 39* 25*  CREATININE 1.79* 1.57* 1.34*  CALCIUM 8.0* 7.7* 8.0*   LFT  Recent Labs  10/18/15 1345  PROT 4.9*  ALBUMIN 2.5*  AST 16  ALT 10*  ALKPHOS 61  BILITOT 0.4   PT/INR  Recent Labs  10/18/15 1345  LABPROT 15.3*  INR 1.20   Hepatitis Panel No  results for input(s): HEPBSAG, HCVAB, HEPAIGM, HEPBIGM in the last 72 hours.  Studies/Results: Dg Chest Portable 1 View  Result Date: 10/18/2015 CLINICAL DATA:  Dark stool as well as stool with bright red blood x2 days. Hx of cholecystectomy on September 05, 2015. Pt denies any chest and abdominal pain at this time. Pt denies SOB and vomiting. Hx of CABG, CHF, DM, HTN, CKD EXAM: PORTABLE CHEST 1 VIEW COMPARISON:  09/03/2015 FINDINGS: There is focal opacity at the left lung base. This is consistent with pneumonia in the proper clinical setting. It may be due to atelectasis. There lungs are hyperexpanded but otherwise clear. Cardiac silhouette is normal in size. There stable changes from previous CABG surgery. No mediastinal or hilar masses or evidence of adenopathy. No pleural effusion or pneumothorax. IMPRESSION: Left lung base opacity consistent with pneumonia or atelectasis. Electronically Signed   By: Lajean Manes M.D.   On: 10/18/2015 14:17    ASSESMENT:   *  Melena, IDA.  In setting of Plavix/ASA.   8/13 EGD: normal.  Unrevealing.. Colonoscopy planned for 8/14 has been rescheduled.   *  Acute blood loss anemia and anemia of chronic dz. S/p PRBC x 2.    *  Hematuria.  Chronic.  S/p ureteral stent 07/2015.  This may be source of anemia.   *  09/03/15 ERCP with sphinct and CBD stone removal.  6/30 attempted lap chole, converted to ex lap with cholecystectomy due to dense adhesions.     PLAN   *  Colonoscopy rescheduled for tomorrow at 0800 Will finish the first liter of movi prep today/now and start another spit dose prep tonite/[place] AM.  Pt aware.     Azucena Freed  10/20/2015, 10:13 AM Pager: [phone]

## 2015-10-20 NOTE — Progress Notes (Signed)
Patient completed bowel prep successfully overnight. However, last BM was not clearing; demonstrated large clots with dark brown, watery stool.  Noted for GI follow-up and recommendations this AM.  Continuing to monitor output. Will document further if bowel movements begin to clear before change of shift this AM.

## 2015-10-20 NOTE — Progress Notes (Signed)
Called to let Dr Ardis Hughs know that patient had finished prep, but stools were not clear and still some solids.  New orders given for Moviprep.  Talked to Gerald Stabs, RN on the floor to let him know of the new orders and that we would change the patients scheduled time today for the colonoscopy

## 2015-10-20 NOTE — Progress Notes (Addendum)
Spoke to Theodis Aguas, PA earlier today.  She stated to instruct patient to finish movie prep from this am then to  give patient the second half of her first movie prep order today at 4pm.  If patient is not having clear bowel movements give patient half of second movi prep tonight.  Per order this dose will be due at 0100.  (there are 2 packs per box of movi prep, only give one leaving one pack for waste) Will pass this on to nightshift RN.

## 2015-10-21 ENCOUNTER — Encounter (HOSPITAL_COMMUNITY)
Admission: EM | Disposition: A | Discharge status: Home Health | Payer: Self-pay | Source: Home / Self Care | Attending: Internal Medicine

## 2015-10-21 ENCOUNTER — Inpatient Hospital Stay (HOSPITAL_COMMUNITY): Payer: Medicare Other | Admitting: Anesthesiology

## 2015-10-21 ENCOUNTER — Encounter (HOSPITAL_COMMUNITY): Payer: Self-pay | Admitting: *Deleted

## 2015-10-21 DIAGNOSIS — D122 Benign neoplasm of ascending colon: Secondary | ICD-10-CM

## 2015-10-21 DIAGNOSIS — D120 Benign neoplasm of cecum: Secondary | ICD-10-CM

## 2015-10-21 DIAGNOSIS — D123 Benign neoplasm of transverse colon: Secondary | ICD-10-CM

## 2015-10-21 HISTORY — PX: COLONOSCOPY: SHX174

## 2015-10-21 HISTORY — PX: GIVENS CAPSULE STUDY: SHX5432

## 2015-10-21 HISTORY — PX: COLONOSCOPY WITH PROPOFOL: SHX5780

## 2015-10-21 LAB — BASIC METABOLIC PANEL
Anion gap: 9 (ref 5–15)
BUN: 15 mg/dL (ref 6–20)
CALCIUM: 8.6 mg/dL — AB (ref 8.9–10.3)
CHLORIDE: 117 mmol/L — AB (ref 101–111)
CO2: 18 mmol/L — AB (ref 22–32)
CREATININE: 1.31 mg/dL — AB (ref 0.44–1.00)
GFR, EST AFRICAN AMERICAN: 45 mL/min — AB (ref >60)
GFR, EST NON AFRICAN AMERICAN: 39 mL/min — AB (ref >60)
Glucose, Bld: 104 mg/dL — ABNORMAL HIGH (ref 65–99)
POTASSIUM: 4 mmol/L (ref 3.5–5.1)
SODIUM: 144 mmol/L (ref 135–145)

## 2015-10-21 LAB — CBC
HEMATOCRIT: 32.6 % — AB (ref 36.0–46.0)
HEMOGLOBIN: 10 g/dL — AB (ref 12.0–15.0)
MCH: 28.1 pg (ref 26.0–34.0)
MCHC: 30.7 g/dL (ref 30.0–36.0)
MCV: 91.6 fL (ref 78.0–100.0)
Platelets: 308 10*3/uL (ref 150–400)
RBC: 3.56 MIL/uL — AB (ref 3.87–5.11)
RDW: 18.5 % — ABNORMAL HIGH (ref 11.5–15.5)
WBC: 5.6 10*3/uL (ref 4.0–10.5)

## 2015-10-21 LAB — GLUCOSE, CAPILLARY
GLUCOSE-CAPILLARY: 103 mg/dL — AB (ref 65–99)
GLUCOSE-CAPILLARY: 134 mg/dL — AB (ref 65–99)
Glucose-Capillary: 124 mg/dL — ABNORMAL HIGH (ref 65–99)

## 2015-10-21 SURGERY — COLONOSCOPY WITH PROPOFOL
Anesthesia: Monitor Anesthesia Care

## 2015-10-21 SURGERY — IMAGING PROCEDURE, GI TRACT, INTRALUMINAL, VIA CAPSULE

## 2015-10-21 MED ORDER — LIDOCAINE HCL (CARDIAC) 20 MG/ML IV SOLN
INTRAVENOUS | Status: DC | PRN
  Administered 2015-10-21: 40 mg via INTRATRACHEAL

## 2015-10-21 MED ORDER — SODIUM CHLORIDE 0.9 % IV SOLN: INTRAVENOUS | Status: DC

## 2015-10-21 MED ORDER — LACTATED RINGERS IV SOLN
INTRAVENOUS | Status: DC
  Administered 2015-10-21: 1000 mL via INTRAVENOUS

## 2015-10-21 MED ORDER — PROPOFOL 10 MG/ML IV BOLUS
INTRAVENOUS | Status: DC | PRN
  Administered 2015-10-21 (×2): 25 mg via INTRAVENOUS
  Administered 2015-10-21: 20 mg via INTRAVENOUS
  Administered 2015-10-21 (×8): 25 mg via INTRAVENOUS

## 2015-10-21 SURGICAL SUPPLY — 1 items: TOWEL COTTON PACK 4EA (MISCELLANEOUS) ×6 IMPLANT

## 2015-10-21 NOTE — Progress Notes (Signed)
[facility]  Patient Status: Active (receiving services up to time of hospitalization)  [facility] is providing the following services: RN and PT  If patient discharges after hours, please call [phone].   [name] [name] 10/21/2015, 4:13 PM

## 2015-10-21 NOTE — Plan of Care (Signed)
Problem: Safety: Goal: Ability to remain free from injury will improve Outcome: Progressing RN instructed patient to call and wait for staff assistance prior to getting out of bed.  Patient stated understanding and has made no attempts to get out of bed unassisted thus far this shift.  RN and nurse tech providing safe environment this shift.

## 2015-10-21 NOTE — Progress Notes (Signed)
PROGRESS NOTE    [name] [name]  [id number] DOB: [birth year] DOA: 10/18/2015 PCP: [name], MD Brief Narrative: [name] [name] is a 75 y.o. female with multiple medical problems into limited to GERD , chronic kidney disease, coronary artery disease, hypertension, congestive heart failure, and type 2 diabetes problems.. Patient presented by EMS to the emergency department for weakness. She was found to be heme positive with a hb of 5.1 down from baseline of 8-9. Patient is on Plavix, she takes a baby aspirin every day. Given 2 units PRBC Gi consulted, EGD negative, colonoscopy s/p polypectomy, negative for source of blood loss, capsule planned  Assessment & Plan: GI bleed/Acute blood loss anemia -heme positive on admission -continue PPI -EGD negative 8/13 -ASA/plavix on hold -s/p colonoscopy this am, which was notable for 4 polyps s/p polypectomy no source of blood loss identified, GI planning Capsule endoscopy  Acute blood loss anemia -Baseline hemoglobin mid 8 to mid 9 range.  -Hb 5.1 on admission -s/p 2 units PRBC, Hb improved to 8.4, 10 today -iron defi anemia noted from 10/01/15     AKI superimposed on CKD 3 . Baseline Cr 1.39, up to 1.79  -improving -back to baseline  Abnormal CXR -CXr with left lung base pneumonia versus atelectasis.  No coughing, no respiratory distress. Normal white count. .Will hold off on Antibiotics, monitor for now -needs FU CXR in 3-4weeks  DM2.  Followed outpatient by Endocrine -hold home insulin -Stable, SSI  Hx of diastolic dysfunction, grade 2 on June 2017 echo. EF preserved at 55% -stopped IVF, monitor -euvolemic at this time  Hypothyroidism.  . -Continue home Synthroid  CAD, remote CABG -stable, hold ASA/plavix, continue toprol/statin -followed by Dr.Berry -last stress test in 9/13 was non ischemic, on medical management per Cards  PAD -s/p Bilateral CEA, aortobifem by pass and R fem pop bypass -hold ASA/Plavix at  this time  H/o retroperitoneal fibrosis and R hydronephrosis -s/p R ureteral stents followed by Dr.Herrick, exchanging q 79months  DVT prophylaxis:   SCDs  Code Status:     Full code  Family Communication: None at bedside  Disposition Plan:   Discharge home in 24 hours if stable pending Capsule study    Consultants:   [facility]   Procedures: EGD 8/13: normal   Subjective: No complaints, feels ok, just back from colonoscopy  Objective: Vitals:   10/21/15 0724 10/21/15 0831 10/21/15 0846 10/21/15 1205  BP: (!) 186/50 102/64 (!) 149/49   Pulse:      Resp: 17 17 15    Temp: 98 F (36.7 C)     TempSrc: Oral     SpO2: 100% 100% 100%   Weight:    59.9 kg (132 lb)  Height:    5\' 6"  (1.676 m)    Intake/Output Summary (Last 24 hours) at 10/21/15 1502 Last data filed at 10/21/15 0900  Gross per 24 hour  Intake             1340 ml  Output                0 ml  Net             1340 ml   Filed Weights   10/20/15 0433 10/21/15 0536 10/21/15 1205  Weight: 65 kg (143 lb 3.2 oz) 60.2 kg (132 lb 12.8 oz) 59.9 kg (132 lb)    Examination:  General exam: Appears calm and comfortable  Respiratory system: Clear to auscultation. Respiratory effort normal. Cardiovascular system:  S1 & S2 heard, RRR. No JVD, murmurs, rubs, gallops or clicks. No pedal edema. Gastrointestinal system: Abdomen is nondistended, soft and nontender. No organomegaly or masses felt. Normal bowel sounds heard. Central nervous system: Alert and oriented. No focal neurological deficits. Extremities: Symmetric 5 x 5 power. Skin: No rashes, lesions or ulcers Psychiatry: Judgement and insight appear normal. Mood & affect appropriate.     Data Reviewed: I have personally reviewed following labs and imaging studies  CBC:  Recent Labs Lab 10/18/15 1345 10/19/15 0121 10/19/15 2047 10/20/15 0548 10/21/15 0420  WBC 9.8 9.1 10.2 7.1 5.6  NEUTROABS 7.5  --   --   --   --   HGB 5.1* 7.7* 9.6* 8.4* 10.0*  HCT 16.9*  24.3* 31.5* 26.7* 32.6*  MCV 91.4 88.7 91.0 89.9 91.6  PLT 292 261 340 289 [id number]   Basic Metabolic Panel:  Recent Labs Lab 10/18/15 1345 10/19/15 0121 10/20/15 0548 10/21/15 0420  NA 135 138 140 144  K 4.3 4.7 3.9 4.0  CL 109 112* 117* 117*  CO2 16* 17* 17* 18*  GLUCOSE 134* 100* 107* 104*  BUN 46* 39* 25* 15  CREATININE 1.79* 1.57* 1.34* 1.31*  CALCIUM 8.0* 7.7* 8.0* 8.6*   GFR: Estimated Creatinine Clearance: 35.3 mL/min (by C-G formula based on SCr of 1.31 mg/dL). Liver Function Tests:  Recent Labs Lab 10/18/15 1345  AST 16  ALT 10*  ALKPHOS 61  BILITOT 0.4  PROT 4.9*  ALBUMIN 2.5*    Recent Labs Lab 10/18/15 1345  LIPASE 16   No results for input(s): AMMONIA in the last 168 hours. Coagulation Profile:  Recent Labs Lab 10/18/15 1345  INR 1.20   Cardiac Enzymes: No results for input(s): CKTOTAL, CKMB, CKMBINDEX, TROPONINI in the last 168 hours. BNP (last 3 results) No results for input(s): PROBNP in the last 8760 hours. HbA1C: No results for input(s): HGBA1C in the last 72 hours. CBG:  Recent Labs Lab 10/20/15 0720 10/20/15 1107 10/20/15 1631 10/20/15 2104 10/21/15 1132  GLUCAP 113* 117* 102* 102* 103*   Lipid Profile: No results for input(s): CHOL, HDL, LDLCALC, TRIG, CHOLHDL, LDLDIRECT in the last 72 hours. Thyroid Function Tests: No results for input(s): TSH, T4TOTAL, FREET4, T3FREE, THYROIDAB in the last 72 hours. Anemia Panel: No results for input(s): VITAMINB12, FOLATE, FERRITIN, TIBC, IRON, RETICCTPCT in the last 72 hours. Urine analysis:    Component Value Date/Time   COLORURINE YELLOW 10/01/2015 1630   APPEARANCEUR CLEAR 10/01/2015 1630   LABSPEC 1.020 10/01/2015 1630   PHURINE 5.5 10/01/2015 1630   GLUCOSEU NEGATIVE 10/01/2015 1630   HGBUR LARGE (A) 10/01/2015 1630   BILIRUBINUR NEGATIVE 10/01/2015 1630   KETONESUR NEGATIVE 10/01/2015 1630   PROTEINUR >300 (A) 08/28/2015 1812   UROBILINOGEN 0.2 10/01/2015 1630   NITRITE  NEGATIVE 10/01/2015 1630   LEUKOCYTESUR LARGE (A) 10/01/2015 1630   Sepsis Labs: @LABRCNTIP (procalcitonin:4,lacticidven:4)  )No results found for this or any previous visit (from the past 240 hour(s)).       Radiology Studies: No results found.      Scheduled Meds: . gabapentin  300 mg Oral TID  . latanoprost  1 drop Both Eyes QHS  . levothyroxine  125 mcg Oral QAC breakfast  . metoprolol succinate  12.5 mg Oral Daily  . pantoprazole  40 mg Oral QAC breakfast  . rosuvastatin  20 mg Oral q1800   Continuous Infusions:     LOS: 3 days    Time spent: 58min    [name] [name]  Broadus John, MD [facility] Pager [phone]  If 7PM-7AM, please contact night-coverage [email or web address] Password [email or web address] 10/21/2015, 3:02 PM

## 2015-10-21 NOTE — Progress Notes (Signed)
Patient recent blood pressure 177/59, heart rate this shift 50-60's.  Patient does not have any PRN blood pressure medication(s) ordered.  [facility] text paged via Amion with this information.

## 2015-10-21 NOTE — Progress Notes (Signed)
Patient ingested given capsule for small bowel endoscopy study. This is 12 hour test. Patient to wear belt and recorder until 0010 10/22/15. Written instructions reviewed with patient and Merleen Nicely, Therapist, sports.

## 2015-10-21 NOTE — Anesthesia Procedure Notes (Signed)
Procedure Name: MAC Date/Time: 10/21/2015 8:02 AM Performed by: Mariea Clonts Pre-anesthesia Checklist: Patient identified, Emergency Drugs available, Suction available, Patient being monitored and Timeout performed Patient Re-evaluated:Patient Re-evaluated prior to inductionOxygen Delivery Method: Nasal cannula

## 2015-10-21 NOTE — H&P (View-Only) (Signed)
Daily Rounding Note  10/20/2015, 10:13 AM  LOS: 2 days   SUBJECTIVE:   Chief complaint: diarrhea Loose stools during bowel prep.  Completed Miralax prep but not clear and blood noted in stool.  Pt not sure if blood ws from urine but staff reports it was fecal blood. She has chronic hematuria  OBJECTIVE:         Vital signs in last 24 hours:    Temp:  [97.4 F (36.3 C)-98.8 F (37.1 C)] 98.5 F (36.9 C) (08/14 0800) Pulse Rate:  [53-62] 56 (08/14 0818) Resp:  [18-22] 19 (08/14 0800) BP: (138-167)/(46-55) 163/48 (08/14 0818) SpO2:  [97 %-100 %] 98 % (08/14 0800) Weight:  [65 kg (143 lb 3.2 oz)] 65 kg (143 lb 3.2 oz) (08/14 0433) Last BM Date: 10/19/15 Filed Weights   10/18/15 1713 10/19/15 0341 10/20/15 0433  Weight: 63 kg (138 lb 12.8 oz) 66.3 kg (146 lb 1.6 oz) 65 kg (143 lb 3.2 oz)   General: pleasant, looks chronically ill.  comfortable   Heart: RRR Chest: clear bil.  No cough or SOB Abdomen: soft, NT, ND.   Rectal:  Empty of stool, no masses or blood  Extremities: no CCE Neuro/Psych:  Pleasant, oriented x 3 and fully alert.  Calm.  No limb weakness or tremor.   Intake/Output from previous day: 08/13 0701 - 08/14 0700 In: 660 [P.O.:660] Out: 550 [Urine:550]  Intake/Output this shift: No intake/output data recorded.  Lab Results:  Recent Labs  10/19/15 0121 10/19/15 2047 10/20/15 0548  WBC 9.1 10.2 7.1  HGB 7.7* 9.6* 8.4*  HCT 24.3* 31.5* 26.7*  PLT 261 340 289   BMET  Recent Labs  10/18/15 1345 10/19/15 0121 10/20/15 0548  NA 135 138 140  K 4.3 4.7 3.9  CL 109 112* 117*  CO2 16* 17* 17*  GLUCOSE 134* 100* 107*  BUN 46* 39* 25*  CREATININE 1.79* 1.57* 1.34*  CALCIUM 8.0* 7.7* 8.0*   LFT  Recent Labs  10/18/15 1345  PROT 4.9*  ALBUMIN 2.5*  AST 16  ALT 10*  ALKPHOS 61  BILITOT 0.4   PT/INR  Recent Labs  10/18/15 1345  LABPROT 15.3*  INR 1.20   Hepatitis Panel No  results for input(s): HEPBSAG, HCVAB, HEPAIGM, HEPBIGM in the last 72 hours.  Studies/Results: Dg Chest Portable 1 View  Result Date: 10/18/2015 CLINICAL DATA:  Dark stool as well as stool with bright red blood x2 days. Hx of cholecystectomy on September 05, 2015. Pt denies any chest and abdominal pain at this time. Pt denies SOB and vomiting. Hx of CABG, CHF, DM, HTN, CKD EXAM: PORTABLE CHEST 1 VIEW COMPARISON:  09/03/2015 FINDINGS: There is focal opacity at the left lung base. This is consistent with pneumonia in the proper clinical setting. It may be due to atelectasis. There lungs are hyperexpanded but otherwise clear. Cardiac silhouette is normal in size. There stable changes from previous CABG surgery. No mediastinal or hilar masses or evidence of adenopathy. No pleural effusion or pneumothorax. IMPRESSION: Left lung base opacity consistent with pneumonia or atelectasis. Electronically Signed   By: Lajean Manes M.D.   On: 10/18/2015 14:17    ASSESMENT:   *  Melena, IDA.  In setting of Plavix/ASA.   8/13 EGD: normal.  Unrevealing.. Colonoscopy planned for 8/14 has been rescheduled.   *  Acute blood loss anemia and anemia of chronic dz. S/p PRBC x 2.    *  Hematuria.  Chronic.  S/p ureteral stent 07/2015.  This may be source of anemia.   *  09/03/15 ERCP with sphinct and CBD stone removal.  6/30 attempted lap chole, converted to ex lap with cholecystectomy due to dense adhesions.     PLAN   *  Colonoscopy rescheduled for tomorrow at 0800 Will finish the first liter of movi prep today/now and start another spit dose prep tonite/[facility] AM.  Pt aware.     Azucena Freed  10/20/2015, 10:13 AM Pager: [phone]

## 2015-10-21 NOTE — Interval H&P Note (Signed)
History and Physical Interval Note:  10/21/2015 7:42 AM  [name] [name]  has presented today for surgery, with the diagnosis of melena, GI bleed  The various methods of treatment have been discussed with the patient and family. After consideration of risks, benefits and other options for treatment, the patient has consented to  Procedure(s): COLONOSCOPY WITH PROPOFOL (N/A) as a surgical intervention .  The patient's history has been reviewed, patient examined, no change in status, stable for surgery.  I have reviewed the patient's chart and labs.  Questions were answered to the patient's satisfaction.     Milus Banister

## 2015-10-21 NOTE — Anesthesia Postprocedure Evaluation (Signed)
Anesthesia Post Note  Patient: [name] [name]  Procedure(s) Performed: Procedure(s) (LRB): COLONOSCOPY WITH PROPOFOL (N/A)  Patient location during evaluation: PACU Anesthesia Type: MAC Level of consciousness: awake and alert Pain management: pain level controlled Vital Signs Assessment: post-procedure vital signs reviewed and stable Respiratory status: spontaneous breathing, nonlabored ventilation, respiratory function stable and patient connected to nasal cannula oxygen Cardiovascular status: stable and blood pressure returned to baseline Anesthetic complications: no    Last Vitals:  Vitals:   10/21/15 0831 10/21/15 0846  BP: 102/64 (!) 149/49  Pulse:    Resp: 17 15  Temp:      Last Pain:  Vitals:   10/21/15 0724  TempSrc: Oral  PainSc:                  Tiajuana Amass

## 2015-10-21 NOTE — Care Management Important Message (Signed)
Important Message  Patient Details  Name: [name] [name] MRN: [id number] Date of Birth: 1941-01-04   Medicare Important Message Given:  Yes    Nathen May 10/21/2015, 10:44 AM

## 2015-10-21 NOTE — Op Note (Signed)
[facility] Patient Name: [name] [name] Procedure Date : 10/21/2015 MRN: [id number] Attending MD: Milus Banister , MD Date of Birth: December 15, 1940 CSN: [id number] Age: 75 Admit Type: Inpatient Procedure:                Colonoscopy Indications:              Melena, acute on chronic anemia; recent EGD was                            normal (Dr. Carlean Purl) Providers:                Milus Banister, MD, Cleda Daub, RN, William Dalton, Technician Referring MD:              Medicines:                Monitored Anesthesia Care Complications:            No immediate complications. Estimated blood loss:                            None. Estimated Blood Loss:     Estimated blood loss: none. Procedure:                Pre-Anesthesia Assessment:                           - Prior to the procedure, a History and Physical                            was performed, and patient medications and                            allergies were reviewed. The patient's tolerance of                            previous anesthesia was also reviewed. The risks                            and benefits of the procedure and the sedation                            options and risks were discussed with the patient.                            All questions were answered, and informed consent                            was obtained. Prior Anticoagulants: The patient has                            taken ASA and Plavix (clopidogrel), last dose was                            3-4  days prior to procedure. ASA Grade Assessment:                            III - A patient with severe systemic disease. After                            reviewing the risks and benefits, the patient was                            deemed in satisfactory condition to undergo the                            procedure.                           After obtaining informed consent, the colonoscope     was passed under direct vision. Throughout the                            procedure, the patient's blood pressure, pulse, and                            oxygen saturations were monitored continuously. The                            [id number] scope was introduced through                            the anus and advanced to the the cecum, identified                            by appendiceal orifice and ileocecal valve. The                            colonoscopy was performed without difficulty. The                            patient tolerated the procedure well. The quality                            of the bowel preparation was excellent. The                            ileocecal valve, appendiceal orifice, and rectum                            were photographed. Findings:      Four sessile polyps were found in the transverse colon, ascending colon       and cecum. The polyps were 3 to 5 mm in size. These polyps were removed       with a cold snare. Resection was complete, two of the polyps were       retrieved      Many small and large-mouthed diverticula were found in the left colon.  The exam was otherwise without abnormality on direct and retroflexion       views. Impression:               - Four 3 to 5 mm polyps in the transverse colon, in                            the ascending colon and in the cecum, removed with                            a cold snare. Complete resection. Partial retrieval.                           - Diverticulosis in the left colon.                           - These findings probably do not explain her dark                            stools, acute on chronic anemia. Moderate Sedation:      none Recommendation:           - Return patient to hospital ward for ongoing care.                            We will arrange small bowel capsule study (possibly                            today). She was on two antiplatelet agents at                             admission (ASA and plavix), need to consider if it                            is safe from CAD standpoint for her to resume only                            one of those meds.                           - Resume regular diet after capsule study.                           - Continue present medications.                           - Await pathology results to determine timing,                            necessity of surveillance colonsocopy. Procedure Code(s):        --- Professional ---                           [phone], Colonoscopy, flexible; with removal of  tumor(s), polyp(s), or other lesion(s) by snare                            technique Diagnosis Code(s):        --- Professional ---                           D12.3, Benign neoplasm of transverse colon (hepatic                            flexure or splenic flexure)                           D12.2, Benign neoplasm of ascending colon                           D12.0, Benign neoplasm of cecum                           K92.1, Melena (includes Hematochezia)                           K57.30, Diverticulosis of large intestine without                            perforation or abscess without bleeding CPT copyright 2016 American Medical [employer]. All rights reserved. The codes documented in this report are preliminary and upon coder review may  be revised to meet current compliance requirements. Milus Banister, MD 10/21/2015 8:36:53 AM This report has been signed electronically. Number of Addenda: 0

## 2015-10-21 NOTE — Transfer of Care (Signed)
Immediate Anesthesia Transfer of Care Note  Patient: [name] [name]  Procedure(s) Performed: Procedure(s): COLONOSCOPY WITH PROPOFOL (N/A)  Patient Location: PACU  Anesthesia Type:MAC  Level of Consciousness: awake, alert  and oriented  Airway & Oxygen Therapy: Patient Spontanous Breathing and Patient connected to nasal cannula oxygen  Post-op Assessment: Report given to RN and Post -op Vital signs reviewed and stable  Post vital signs: Reviewed and stable  Last Vitals:  Vitals:   10/21/15 0536 10/21/15 0724  BP: (!) 177/59 (!) 186/50  Pulse: 66   Resp: 18 17  Temp: 36.3 C 36.7 C    Last Pain:  Vitals:   10/21/15 0724  TempSrc: Oral  PainSc:       Patients Stated Pain Goal: 0 ([id number])  Complications: No apparent anesthesia complications

## 2015-10-22 ENCOUNTER — Encounter (HOSPITAL_COMMUNITY): Payer: Self-pay | Admitting: Gastroenterology

## 2015-10-22 DIAGNOSIS — K922 Gastrointestinal hemorrhage, unspecified: Secondary | ICD-10-CM

## 2015-10-22 DIAGNOSIS — K921 Melena: Secondary | ICD-10-CM

## 2015-10-22 DIAGNOSIS — N189 Chronic kidney disease, unspecified: Secondary | ICD-10-CM

## 2015-10-22 DIAGNOSIS — N183 Chronic kidney disease, stage 3 (moderate): Secondary | ICD-10-CM

## 2015-10-22 DIAGNOSIS — D62 Acute posthemorrhagic anemia: Secondary | ICD-10-CM

## 2015-10-22 LAB — CBC
HCT: 26.9 % — ABNORMAL LOW (ref 36.0–46.0)
HEMOGLOBIN: 8.4 g/dL — AB (ref 12.0–15.0)
MCH: 28.6 pg (ref 26.0–34.0)
MCHC: 31.2 g/dL (ref 30.0–36.0)
MCV: 91.5 fL (ref 78.0–100.0)
PLATELETS: 284 10*3/uL (ref 150–400)
RBC: 2.94 MIL/uL — ABNORMAL LOW (ref 3.87–5.11)
RDW: 18.2 % — ABNORMAL HIGH (ref 11.5–15.5)
WBC: 6.2 10*3/uL (ref 4.0–10.5)

## 2015-10-22 LAB — TYPE AND SCREEN
ABO/RH(D): O POS
ANTIBODY SCREEN: POSITIVE
DONOR AG TYPE: NEGATIVE
DONOR AG TYPE: NEGATIVE
DONOR AG TYPE: NEGATIVE
Donor AG Type: NEGATIVE
UNIT DIVISION: 0
Unit division: 0
Unit division: 0
Unit division: 0

## 2015-10-22 LAB — BASIC METABOLIC PANEL
ANION GAP: 7 (ref 5–15)
BUN: 13 mg/dL (ref 6–20)
CALCIUM: 8.3 mg/dL — AB (ref 8.9–10.3)
CO2: 18 mmol/L — AB (ref 22–32)
CREATININE: 1.34 mg/dL — AB (ref 0.44–1.00)
Chloride: 116 mmol/L — ABNORMAL HIGH (ref 101–111)
GFR, EST AFRICAN AMERICAN: 44 mL/min — AB (ref >60)
GFR, EST NON AFRICAN AMERICAN: 38 mL/min — AB (ref >60)
GLUCOSE: 110 mg/dL — AB (ref 65–99)
Potassium: 4.3 mmol/L (ref 3.5–5.1)
Sodium: 141 mmol/L (ref 135–145)

## 2015-10-22 LAB — GLUCOSE, CAPILLARY
Glucose-Capillary: 112 mg/dL — ABNORMAL HIGH (ref 65–99)
Glucose-Capillary: 118 mg/dL — ABNORMAL HIGH (ref 65–99)

## 2015-10-22 MED ORDER — HYDRALAZINE HCL 20 MG/ML IJ SOLN
5.0000 mg | INTRAMUSCULAR | Status: DC | PRN
  Administered 2015-10-22: 5 mg via INTRAVENOUS
  Filled 2015-10-22: qty 1

## 2015-10-22 NOTE — Care Management Note (Addendum)
Case Management Note  Patient Details  Name: [name] [name] MRN: [id number] Date of Birth: June 24, 1940  Subjective/Objective: Pt presented with Hgb 5.1 down from baseline 8-9. Post EGD, Colonoscopy and Capsule Endoscopy. Plan to monitor HGB and recheck in am. IF stable plan for d/c home. Pt is from home.                    Action/Plan: Pt was asleep at the time of visit- however plan will be to go home with daughter once stable. PT to evaluate and CM to monitor for disposition needs.  Expected Discharge Date:                  Expected Discharge Plan:  [place]  In-House Referral:  NA  Discharge planning Services  CM Consult  Post Acute Care Choice:   Resumption of Home Health Services.  Choice offered to:   [place]  DME Arranged:   N/A DME Agency:   N/A  HH Arranged:   RN, PT HH Agency:   [facility]  Status of Service:  In process, will continue to follow  If discussed at Long Length of Stay Meetings, dates discussed:    Additional Comments: 1700 10-22-15 Jacqlyn Krauss, RN,BSN [phone] Pt is currently active with [facility]. MD to place order for resumption services once stable. No further needs from CM at this time.  Bethena Roys, RN 10/22/2015, 2:33 PM

## 2015-10-22 NOTE — Progress Notes (Signed)
PROGRESS NOTE    [name] [name]  [id number] DOB: 12/02/1940 DOA: 10/18/2015 PCP: [name], MD Brief Narrative: [name] [name] is a 75 y.o. female with multiple medical problems into limited to GERD , chronic kidney disease, coronary artery disease, hypertension, congestive heart failure, and type 2 diabetes problems.. Patient presented by EMS to the emergency department for weakness. She was found to be heme positive with a hb of 5.1 down from baseline of 8-9. Patient is on Plavix, she takes a baby aspirin every day. Given 2 units PRBC Gi consulted, EGD negative, colonoscopy s/p polypectomy, negative for source of blood loss, capsule planned  Assessment & Plan: GI bleed/Acute blood loss anemia -heme positive on admission -continue PPI -EGD negative 8/13 -ASA/plavix on hold -s/p colonoscopy this am, which was notable for 4 polyps s/p polypectomy no source of blood loss identified, GI planning Capsule endoscopy Hold further  transfusion.  Would recheck Hgb in AM.  If stable, can discharge home in am   Acute blood loss anemia -Baseline hemoglobin mid 8 to mid 9 range.  -Hb 5.1 on admission, now 8.4  -s/p 2 units PRBC, Hb improved to 8.4,  -iron defi anemia noted from 10/01/15     AKI superimposed on CKD 3 . Baseline Cr 1.39, up to 1.79  -improving -back to baseline  Abnormal CXR -CXr with left lung base pneumonia versus atelectasis.  No coughing, no respiratory distress. Normal white count. .Will hold off on Antibiotics, monitor for now -needs FU CXR in 3-4weeks  DM2.  Followed outpatient by Endocrine -hold home insulin -Stable, SSI  Hx of diastolic dysfunction, grade 2 on June 2017 echo. EF preserved at 55% -stopped IVF, monitor -euvolemic at this time  Hypothyroidism.  . -Continue home Synthroid  CAD, remote CABG -stable, hold ASA/plavix, continue toprol/statin -followed by Dr.Berry -last stress test in 9/13 was non ischemic, on medical management per  Cards  PAD -s/p Bilateral CEA, aortobifem by pass and R fem pop bypass -hold ASA/Plavix at this time  H/o retroperitoneal fibrosis and R hydronephrosis -s/p R ureteral stents followed by Dr.Herrick, exchanging q 29months  DVT prophylaxis:   SCDs  Code Status:     Full code  Family Communication: None at bedside  Disposition Plan:   Discharge home in 24 hours if stable pending Capsule study    Consultants:   [facility]   Procedures: EGD 8/13: normal   Subjective: No complaints, feels ok, just back from colonoscopy  Objective: Vitals:   10/21/15 1500 10/21/15 2100 10/22/15 0500 10/22/15 1339  BP: 133/61 (!) 153/64 (!) 117/48   Pulse: 63 63 61   Resp: 17 17 18    Temp: 97.9 F (36.6 C) 97.6 F (36.4 C) 98.3 F (36.8 C) 98.2 F (36.8 C)  TempSrc: Oral Oral Oral Oral  SpO2: 98% 100% 98% 98%  Weight:   60.1 kg (132 lb 8 oz)   Height:        Intake/Output Summary (Last 24 hours) at 10/22/15 1512 Last data filed at 10/22/15 1437  Gross per 24 hour  Intake              480 ml  Output              450 ml  Net               30 ml   Filed Weights   10/21/15 0536 10/21/15 1205 10/22/15 0500  Weight: 60.2 kg (132 lb 12.8 oz) 59.9 kg (  132 lb) 60.1 kg (132 lb 8 oz)    Examination:  General exam: Appears calm and comfortable  Respiratory system: Clear to auscultation. Respiratory effort normal. Cardiovascular system: S1 & S2 heard, RRR. No JVD, murmurs, rubs, gallops or clicks. No pedal edema. Gastrointestinal system: Abdomen is nondistended, soft and nontender. No organomegaly or masses felt. Normal bowel sounds heard. Central nervous system: Alert and oriented. No focal neurological deficits. Extremities: Symmetric 5 x 5 power. Skin: No rashes, lesions or ulcers Psychiatry: Judgement and insight appear normal. Mood & affect appropriate.     Data Reviewed: I have personally reviewed following labs and imaging studies  CBC:  Recent Labs Lab 10/18/15 1345  10/19/15 0121 10/19/15 2047 10/20/15 0548 10/21/15 0420 10/22/15 0616  WBC 9.8 9.1 10.2 7.1 5.6 6.2  NEUTROABS 7.5  --   --   --   --   --   HGB 5.1* 7.7* 9.6* 8.4* 10.0* 8.4*  HCT 16.9* 24.3* 31.5* 26.7* 32.6* 26.9*  MCV 91.4 88.7 91.0 89.9 91.6 91.5  PLT 292 261 340 289 308 [id number]   Basic Metabolic Panel:  Recent Labs Lab 10/18/15 1345 10/19/15 0121 10/20/15 0548 10/21/15 0420 10/22/15 0616  NA 135 138 140 144 141  K 4.3 4.7 3.9 4.0 4.3  CL 109 112* 117* 117* 116*  CO2 16* 17* 17* 18* 18*  GLUCOSE 134* 100* 107* 104* 110*  BUN 46* 39* 25* 15 13  CREATININE 1.79* 1.57* 1.34* 1.31* 1.34*  CALCIUM 8.0* 7.7* 8.0* 8.6* 8.3*   GFR: Estimated Creatinine Clearance: 34.5 mL/min (by C-G formula based on SCr of 1.34 mg/dL). Liver Function Tests:  Recent Labs Lab 10/18/15 1345  AST 16  ALT 10*  ALKPHOS 61  BILITOT 0.4  PROT 4.9*  ALBUMIN 2.5*    Recent Labs Lab 10/18/15 1345  LIPASE 16   No results for input(s): AMMONIA in the last 168 hours. Coagulation Profile:  Recent Labs Lab 10/18/15 1345  INR 1.20   Cardiac Enzymes: No results for input(s): CKTOTAL, CKMB, CKMBINDEX, TROPONINI in the last 168 hours. BNP (last 3 results) No results for input(s): PROBNP in the last 8760 hours. HbA1C: No results for input(s): HGBA1C in the last 72 hours. CBG:  Recent Labs Lab 10/21/15 1132 10/21/15 1611 10/21/15 2124 10/22/15 0726 10/22/15 1107  GLUCAP 103* 134* 124* 112* 118*   Lipid Profile: No results for input(s): CHOL, HDL, LDLCALC, TRIG, CHOLHDL, LDLDIRECT in the last 72 hours. Thyroid Function Tests: No results for input(s): TSH, T4TOTAL, FREET4, T3FREE, THYROIDAB in the last 72 hours. Anemia Panel: No results for input(s): VITAMINB12, FOLATE, FERRITIN, TIBC, IRON, RETICCTPCT in the last 72 hours. Urine analysis:    Component Value Date/Time   COLORURINE YELLOW 10/01/2015 1630   APPEARANCEUR CLEAR 10/01/2015 1630   LABSPEC 1.020 10/01/2015 1630    PHURINE 5.5 10/01/2015 1630   GLUCOSEU NEGATIVE 10/01/2015 1630   HGBUR LARGE (A) 10/01/2015 1630   BILIRUBINUR NEGATIVE 10/01/2015 1630   KETONESUR NEGATIVE 10/01/2015 1630   PROTEINUR >300 (A) 08/28/2015 1812   UROBILINOGEN 0.2 10/01/2015 1630   NITRITE NEGATIVE 10/01/2015 1630   LEUKOCYTESUR LARGE (A) 10/01/2015 1630   Sepsis Labs: @LABRCNTIP (procalcitonin:4,lacticidven:4)  )No results found for this or any previous visit (from the past 240 hour(s)).       Radiology Studies: No results found.      Scheduled Meds: . gabapentin  300 mg Oral TID  . latanoprost  1 drop Both Eyes QHS  . levothyroxine  125 mcg  Oral QAC breakfast  . metoprolol succinate  12.5 mg Oral Daily  . rosuvastatin  20 mg Oral q1800   Continuous Infusions:     LOS: 4 days    Time spent: 16min    Reyne Dumas , MD [facility] Pager [phone]  If 7PM-7AM, please contact night-coverage [email or web address] Password [email or web address] 10/22/2015, 3:12 PM

## 2015-10-22 NOTE — Evaluation (Signed)
Physical Therapy Evaluation Patient Details Name: [name] [name] MRN: [id number] DOB: 1940/05/31 Today's Date: 10/22/2015   History of Present Illness   75 y.o. female presents for generalized weakness in setting of GI bleed. PMH includes GERD , chronic kidney disease, coronary artery disease, hypertension, congestive heart failure, and type 2 diabetes problems  Clinical Impression  Patient demonstrates deficits in functional mobility as indicated below. Will need continued skilled PT to address deficits and maximize function. Will see as indicated and progress as tolerated.    Follow Up Recommendations Home health PT;Supervision - Intermittent    Equipment Recommendations  None recommended by PT    Recommendations for Other Services       Precautions / Restrictions Precautions Precautions: Fall Restrictions Weight Bearing Restrictions: No      Mobility  Bed Mobility Overal bed mobility: Needs Assistance Bed Mobility: Supine to Sit     Supine to sit: Supervision     General bed mobility comments: Vcs for positioning, increased time to perform HOB elevated  Transfers Overall transfer level: Needs assistance Equipment used: Rolling walker (2 wheeled) Transfers: Sit to/from Stand Sit to Stand: Supervision         General transfer comment: no physical assist required, increased time, Vcs for hand placement and upright posture  Ambulation/Gait Ambulation/Gait assistance: Supervision Ambulation Distance (Feet): 210 Feet Assistive device: Rolling walker (2 wheeled) Gait Pattern/deviations: Step-through pattern;Decreased stride length;Antalgic;Drifts right/left;Trunk flexed;Narrow base of support Gait velocity: decreased Gait velocity interpretation: Below normal speed for age/gender General Gait Details: patient steady with use of RW, 2 standing rest breaks, reports pain in bilateral feet during ambulation  Stairs            Wheelchair Mobility     Modified Rankin (Stroke Patients Only)       Balance Overall balance assessment: Needs assistance   Sitting balance-Leahy Scale: Good       Standing balance-Leahy Scale: Fair Standing balance comment: use of Rw for stability at this time                             Pertinent Vitals/Pain Pain Assessment: 0-10 Pain Score: 6  Pain Location: bilateral feet Pain Descriptors / Indicators: Pins and needles Pain Intervention(s): Monitored during session    Home Living Family/patient expects to be discharged to:: Private residence Living Arrangements: Alone Available Help at Discharge: Family;Available PRN/intermittently Type of Home: Apartment Home Access: Stairs to enter   Entrance Stairs-Number of Steps: 1 Home Layout: One level Home Equipment: Walker - 2 wheels;Walker - 4 wheels;Bedside commode;Tub bench;Grab bars - tub/shower;Hand held shower head Additional Comments: plan to go to daughters upon discharge for a few days    Prior Function Level of Independence: Independent               Hand Dominance   Dominant Hand: Right    Extremity/Trunk Assessment   Upper Extremity Assessment: Generalized weakness           Lower Extremity Assessment: Generalized weakness         Communication   Communication: No difficulties  Cognition Arousal/Alertness: Awake/alert Behavior During Therapy: WFL for tasks assessed/performed Overall Cognitive Status: Within Functional Limits for tasks assessed                      General Comments      Exercises        Assessment/Plan    PT Assessment  Patient needs continued PT services  PT Diagnosis Difficulty walking;Abnormality of gait;Generalized weakness   PT Problem List Decreased strength;Decreased activity tolerance;Decreased balance;Decreased mobility;Pain  PT Treatment Interventions DME instruction;Gait training;Stair training;Functional mobility training;Therapeutic  activities;Therapeutic exercise;Balance training;Patient/family education   PT Goals (Current goals can be found in the Care Plan section) Acute Rehab PT Goals Patient Stated Goal: to get back to independence PT Goal Formulation: With patient Time For Goal Achievement: 11/05/15 Potential to Achieve Goals: Good    Frequency Min 3X/week   Barriers to discharge        Co-evaluation               End of Session Equipment Utilized During Treatment: Gait belt Activity Tolerance: Patient tolerated treatment well Patient left: in chair;with call bell/phone within reach Nurse Communication: Mobility status         Time: [id number] PT Time Calculation (min) (ACUTE ONLY): 22 min   Charges:   PT Evaluation $PT Eval Moderate Complexity: 1 Procedure     PT G CodesDuncan Dull 25-Oct-2015, 4:44 PM Alben Deeds, [place] DPT  [phone]

## 2015-10-22 NOTE — Progress Notes (Signed)
Daily Rounding Note  10/22/2015, 8:23 AM  LOS: 4 days   SUBJECTIVE:   Chief complaint: weakness    Feels well.  Not a lot of walking due to pins and needles pain due to diabetic neuropathy.  Eating well.  No Bms since colonoscopy.  Some limited now resolved pain in lower abdomen post colonoscopy.    OBJECTIVE:         Vital signs in last 24 hours:    Temp:  [97.6 F (36.4 C)-98.3 F (36.8 C)] 98.3 F (36.8 C) (08/16 0500) Pulse Rate:  [61-63] 61 (08/16 0500) Resp:  [15-18] 18 (08/16 0500) BP: (102-153)/(48-64) 117/48 (08/16 0500) SpO2:  [98 %-100 %] 98 % (08/16 0500) Weight:  [59.9 kg (132 lb)-60.1 kg (132 lb 8 oz)] 60.1 kg (132 lb 8 oz) (08/16 0500) Last BM Date: 10/20/15 Filed Weights   10/21/15 0536 10/21/15 1205 10/22/15 0500  Weight: 60.2 kg (132 lb 12.8 oz) 59.9 kg (132 lb) 60.1 kg (132 lb 8 oz)   General: pale, somewhat frail.  Comfortable and alert   Heart: RRR Chest: clear bil.  No labored breathing or cough Abdomen: soft, NT, ND.  Active BS  Extremities: no CCE Neuro/Psych:  Oriented x 3.  No tremor.  No gross deficits.   Intake/Output from previous day: 08/15 0701 - 08/16 0700 In: 500 [I.V.:500] Out: 450 [Urine:450]  Intake/Output this shift: No intake/output data recorded.  Lab Results:  Recent Labs  10/20/15 0548 10/21/15 0420 10/22/15 0616  WBC 7.1 5.6 6.2  HGB 8.4* 10.0* 8.4*  HCT 26.7* 32.6* 26.9*  PLT 289 308 284   BMET  Recent Labs  10/20/15 0548 10/21/15 0420 10/22/15 0616  NA 140 144 141  K 3.9 4.0 4.3  CL 117* 117* 116*  CO2 17* 18* 18*  GLUCOSE 107* 104* 110*  BUN 25* 15 13  CREATININE 1.34* 1.31* 1.34*  CALCIUM 8.0* 8.6* 8.3*   LFT No results for input(s): PROT, ALBUMIN, AST, ALT, ALKPHOS, BILITOT, BILIDIR, IBILI in the last 72 hours. PT/INR No results for input(s): LABPROT, INR in the last 72 hours. Hepatitis Panel No results for input(s): HEPBSAG, HCVAB,  HEPAIGM, HEPBIGM in the last 72 hours.  Studies/Results: No results found.   Scheduled Meds: . gabapentin  300 mg Oral TID  . latanoprost  1 drop Both Eyes QHS  . levothyroxine  125 mcg Oral QAC breakfast  . metoprolol succinate  12.5 mg Oral Daily  . pantoprazole  40 mg Oral QAC breakfast  . rosuvastatin  20 mg Oral q1800   Continuous Infusions: . sodium chloride     PRN Meds:.acetaminophen **OR** acetaminophen, albuterol, HYDROcodone-acetaminophen, [DISCONTINUED] ondansetron **OR** ondansetron (ZOFRAN) IV, traZODone   ASSESMENT:   *  Acute blood loss on chronic dz anemia in setting ASA/Plavix.  S/p PRBCs x 2 on 8/12   10/19/15 EGD Normal  10/21/15 colonoscopy: polypectomies x 4.  Left colon tics 8/15 capsule endo: completed late that day.  Will not be read for at least 24 hours Hgb down another 1.5 grams in last 24 hours but the levels are c/w 48 hours ago   *  CAD/CABG 1997, carotid dz/CEA, PVD.  On Plavix ASA.     PLAN   *  Read capsule endo.  In mean time continue to hold Plavix, do we need to d/w Dr Gwenlyn Found her cardiologist? Follow Hgb.   ? Infusion of parenteral iron, iron level is low.  No  ferritin obtained.   *  Would not opt for more transfusion.  Would recheck Hgb in AM.  If stable, can discharge home.   *  As EGD negative and has no GERD sxs, stopped Protonix.         Azucena Freed  10/22/2015, 8:23 AM Pager: [phone]  I have discussed the case with the PA, and that is the plan I formulated. I personally interviewed and examined the patient.  Melena Blood loss anemia Coronary artery disease, cerebrovascular disease, peripheral arterial disease, on dual antiplatelet therapy until this admission.  Her hemoglobin dropped significantly from yesterday, but she has not had any further passage of melena. Therefore, she will remain in the hospital tomorrow for further observation. She is eating well and denies abdominal pain. If her hemoglobin is stable tomorrow  morning, she can be discharged home in her video capsule study will be read by one of our physicians by [place] next week. It would be helpful to know if cardiology feels she can be off Plavix, either temporarily or permanently.    Nelida Meuse III Pager [phone]  Mon-Fri 8a-5p [phone] after 5p, weekends, holidays

## 2015-10-22 NOTE — Progress Notes (Signed)
Daily Rounding Note  10/22/2015, 7:00 PM  LOS: 4 days   SUBJECTIVE:   Chief complaint: weakness    Feels well.  Not a lot of walking due to pins and needles pain due to diabetic neuropathy.  Eating well.  No Bms since colonoscopy.  Some limited now resolved pain in lower abdomen post colonoscopy.    OBJECTIVE:         Vital signs in last 24 hours:    Temp:  [97.6 F (36.4 C)-98.3 F (36.8 C)] 98.2 F (36.8 C) (08/16 1339) Pulse Rate:  [61-63] 61 (08/16 0500) Resp:  [17-18] 18 (08/16 0500) BP: (117-153)/(48-64) 117/48 (08/16 0500) SpO2:  [98 %-100 %] 98 % (08/16 1339) Weight:  [132 lb 8 oz (60.1 kg)] 132 lb 8 oz (60.1 kg) (08/16 0500) Last BM Date: 10/20/15 Filed Weights   10/21/15 0536 10/21/15 1205 10/22/15 0500  Weight: 132 lb 12.8 oz (60.2 kg) 132 lb (59.9 kg) 132 lb 8 oz (60.1 kg)   General: pale, somewhat frail.  Comfortable and alert   Heart: RRR Chest: clear bil.  No labored breathing or cough Abdomen: soft, NT, ND.  Active BS  Extremities: no CCE Neuro/Psych:  Oriented x 3.  No tremor.  No gross deficits.   Intake/Output from previous day: 08/15 0701 - 08/16 0700 In: 500 [I.V.:500] Out: 450 [Urine:450]  Intake/Output this shift: Total I/O In: 720 [P.O.:720] Out: 400 [Urine:400]  Lab Results:  Recent Labs  10/20/15 0548 10/21/15 0420 10/22/15 0616  WBC 7.1 5.6 6.2  HGB 8.4* 10.0* 8.4*  HCT 26.7* 32.6* 26.9*  PLT 289 308 284   BMET  Recent Labs  10/20/15 0548 10/21/15 0420 10/22/15 0616  NA 140 144 141  K 3.9 4.0 4.3  CL 117* 117* 116*  CO2 17* 18* 18*  GLUCOSE 107* 104* 110*  BUN 25* 15 13  CREATININE 1.34* 1.31* 1.34*  CALCIUM 8.0* 8.6* 8.3*   LFT No results for input(s): PROT, ALBUMIN, AST, ALT, ALKPHOS, BILITOT, BILIDIR, IBILI in the last 72 hours. PT/INR No results for input(s): LABPROT, INR in the last 72 hours. Hepatitis Panel No results for input(s): HEPBSAG, HCVAB,  HEPAIGM, HEPBIGM in the last 72 hours.  Studies/Results: No results found.   Scheduled Meds: . gabapentin  300 mg Oral TID  . latanoprost  1 drop Both Eyes QHS  . levothyroxine  125 mcg Oral QAC breakfast  . metoprolol succinate  12.5 mg Oral Daily  . rosuvastatin  20 mg Oral q1800   Continuous Infusions:   PRN Meds:.acetaminophen **OR** acetaminophen, albuterol, HYDROcodone-acetaminophen, [DISCONTINUED] ondansetron **OR** ondansetron (ZOFRAN) IV, traZODone   ASSESMENT:   *  Acute blood loss on chronic dz anemia in setting ASA/Plavix.  S/p PRBCs x 2 on 8/12   10/19/15 EGD Normal  10/21/15 colonoscopy: polypectomies x 4.  Left colon tics 8/15 capsule endo: completed late that day.  Will not be read for at least 24 hours Hgb down another 1.5 grams in last 24 hours but the levels are c/w 48 hours ago   *  CAD/CABG 1997, carotid dz/CEA, PVD.  On Plavix ASA.     PLAN   *  Read capsule endo.  In mean time continue to hold Plavix, do we need to d/w Dr Gwenlyn Found her cardiologist? Follow Hgb.   ? Infusion of parenteral iron, iron level is low.  No ferritin obtained.   *  Would not opt for more transfusion.  Would recheck Hgb in AM.  If stable, can discharge home.   *  As EGD negative and has no GERD sxs, stopped Protonix.         Nelida Meuse III  10/22/2015, 7:00 PM Pager: [phone]  I have discussed the case with the PA, and that is the plan I formulated. I personally interviewed and examined the patient.  Melena Blood loss anemia Coronary artery disease, cerebrovascular disease, peripheral arterial disease, on dual antiplatelet therapy until this admission.  Her hemoglobin dropped significantly from yesterday, but she has not had any further passage of melena. Therefore, she will remain in the hospital tomorrow for further observation. She is eating well and denies abdominal pain. If her hemoglobin is stable tomorrow morning, she can be discharged home in her video capsule  study will be read by one of our physicians by [place] next week. It would be helpful to know if cardiology feels she can be off Plavix, either temporarily or permanently.I was tentatively planning for her to go home on aspirin but off to the video capsule study can be read in order to know if any further endoscopic intervention such as enteroscopy is necessary.    Nelida Meuse III Pager [phone]  Mon-Fri 8a-5p [phone] after 5p, weekends, holidays

## 2015-10-23 DIAGNOSIS — D649 Anemia, unspecified: Secondary | ICD-10-CM

## 2015-10-23 LAB — CBC
HEMATOCRIT: 27.5 % — AB (ref 36.0–46.0)
Hemoglobin: 8.6 g/dL — ABNORMAL LOW (ref 12.0–15.0)
MCH: 28.4 pg (ref 26.0–34.0)
MCHC: 31.3 g/dL (ref 30.0–36.0)
MCV: 90.8 fL (ref 78.0–100.0)
Platelets: 285 10*3/uL (ref 150–400)
RBC: 3.03 MIL/uL — ABNORMAL LOW (ref 3.87–5.11)
RDW: 18.2 % — AB (ref 11.5–15.5)
WBC: 7.6 10*3/uL (ref 4.0–10.5)

## 2015-10-23 MED ORDER — CLOPIDOGREL BISULFATE 75 MG PO TABS: 75.0000 mg | ORAL_TABLET | Freq: Every morning | ORAL | 1 refills | Status: AC

## 2015-10-23 NOTE — Discharge Summary (Signed)
Physician Discharge Summary  [name] [name] MRN: [id number] DOB/AGE: May 04, 1940 75 y.o.  PCP: [name], MD   Admit date: 10/18/2015 Discharge date: 10/23/2015  Discharge Diagnoses:    Active Problems:   Diabetes mellitus, type II ([place])   HTN (hypertension)   AAA (abdominal aortic aneurysm) (HCC)   Hypothyroidism   Chronic kidney disease, stage III (moderate)   Pressure ulcer   GI bleed   Melena   Acute blood loss anemia    Follow-up recommendations Follow-up with PCP in 3-5 days , including all  additional recommended appointments as below Follow-up CBC, CMP in 3-5 days GI prefers she stay off Plavix until GI can read capsule endoscopy, ok to resume low dose ASA at discharge If hemoglobin stable, PCP can allow patient to resume Plavix GI to read capsule endoscopy study [place] next week     Current Discharge Medication List    CONTINUE these medications which have CHANGED   Details  clopidogrel (PLAVIX) 75 MG tablet Take 1 tablet (75 mg total) by mouth every morning. Qty: 90 tablet, Refills: 1      CONTINUE these medications which have NOT CHANGED   Details  albuterol (PROVENTIL HFA;VENTOLIN HFA) 108 (90 BASE) MCG/ACT inhaler Inhale 2 puffs into the lungs every 6 (six) hours as needed for wheezing. Qty: 1 Inhaler, Refills: 5   Associated Diagnoses: Dyspnea    aspirin 81 MG chewable tablet Chew 81 mg by mouth at bedtime.     calcium-vitamin D (OSCAL WITH D) 500-200 MG-UNIT per tablet Take 1 tablet by mouth every morning.    Cranberry 250 MG TABS Take 1 tablet by mouth daily.     Cyanocobalamin (VITAMIN B 12 PO) Take 1 tablet by mouth daily.     ferrous sulfate 325 (65 FE) MG tablet Take 325 mg by mouth every evening.     !! gabapentin (NEURONTIN) 300 MG capsule Take 300 mg by mouth 3 (three) times daily as needed. For pain on the bottom of feet per patient    HYDROcodone-acetaminophen (NORCO/VICODIN) 5-325 MG tablet Take 1 tablet every 6 hours as needed for  back pain Qty: 60 tablet, Refills: 0    insulin aspart (NOVOLOG) 100 UNIT/ML FlexPen Inject 8 units three times a day with meals Qty: 15 mL, Refills: 5    Insulin Detemir (LEVEMIR FLEXTOUCH) 100 UNIT/ML Pen Inject 22 units at bedtime. Qty: 30 mL, Refills: 1    latanoprost (XALATAN) 0.005 % ophthalmic solution Place 1 drop into both eyes at bedtime. Refills: 0    levothyroxine (SYNTHROID, LEVOTHROID) 125 MCG tablet Take 125 mcg by mouth daily before breakfast.    metoprolol succinate (TOPROL-XL) 25 MG 24 hr tablet TAKE 1/2 TABLET (=12.5MG)  EVERY MORNING Qty: 45 tablet, Refills: 3    omeprazole (PRILOSEC) 20 MG capsule TAKE 1 CAPSULE DAILY Qty: 90 capsule, Refills: 3    polyethylene glycol (MIRALAX / GLYCOLAX) packet Take 17 g by mouth daily as needed for moderate constipation. Qty: 14 each, Refills: 0    rosuvastatin (CRESTOR) 20 MG tablet Take 1 tablet (20 mg total) by mouth daily. Qty: 90 tablet, Refills: 1    vitamin C (ASCORBIC ACID) 500 MG tablet Take 500 mg by mouth every morning.    Vitamin D, Ergocalciferol, (DRISDOL) 50000 units CAPS capsule TAKE 1 CAPSULE BY MOUTH EVERY 7 DAYS ON THURSDAYS Qty: 4 capsule, Refills: 5    feeding supplement, GLUCERNA SHAKE, (GLUCERNA SHAKE) LIQD Take 237 mLs by mouth 3 (three) times daily between meals.  Refills: 0    !! gabapentin (NEURONTIN) 300 MG capsule Take 1 capsule (300 mg total) by mouth 3 (three) times daily as needed. Qty: 90 capsule, Refills: 3    Insulin Pen Needle (PEN NEEDLES) 31G X 6 MM MISC Use to inject insulin 4 times per day. Qty: 150 each, Refills: 2     !! - Potential duplicate medications found. Please discuss with provider.       Discharge Condition: *Stable    Discharge Instructions Get Medicines reviewed and adjusted: Please take all your medications with you for your next visit with your Primary MD  Please request your Primary MD to go over all hospital tests and procedure/radiological results at  the follow up, please ask your Primary MD to get all Hospital records sent to his/her office.  If you experience worsening of your admission symptoms, develop shortness of breath, life threatening emergency, suicidal or homicidal thoughts you must seek medical attention immediately by calling 911 or calling your MD immediately if symptoms less severe.  You must read complete instructions/literature along with all the possible adverse reactions/side effects for all the Medicines you take and that have been prescribed to you. Take any new Medicines after you have completely understood and accpet all the possible adverse reactions/side effects.   Do not drive when taking Pain medications.   Do not take more than prescribed Pain, Sleep and Anxiety Medications  Special Instructions: If you have smoked or chewed Tobacco in the last 2 yrs please stop smoking, stop any regular Alcohol and or any Recreational drug use.  Wear Seat belts while driving.  Please note  You were cared for by a hospitalist during your hospital stay. Once you are discharged, your primary care physician will handle any further medical issues. Please note that NO REFILLS for any discharge medications will be authorized once you are discharged, as it is imperative that you return to your primary care physician (or establish a relationship with a primary care physician if you do not have one) for your aftercare needs so that they can reassess your need for medications and monitor your lab values.  Discharge Instructions    Diet - low sodium heart healthy    Complete by:  As directed   Diet - low sodium heart healthy    Complete by:  As directed   Increase activity slowly    Complete by:  As directed   Increase activity slowly    Complete by:  As directed       No Known Allergies    Disposition: 01-Home or Self Care   Consults: * Gastroenterology     Significant Diagnostic Studies:  Dg Chest Portable 1  View  Result Date: 10/18/2015 CLINICAL DATA:  Dark stool as well as stool with bright red blood x2 days. Hx of cholecystectomy on September 05, 2015. Pt denies any chest and abdominal pain at this time. Pt denies SOB and vomiting. Hx of CABG, CHF, DM, HTN, CKD EXAM: PORTABLE CHEST 1 VIEW COMPARISON:  09/03/2015 FINDINGS: There is focal opacity at the left lung base. This is consistent with pneumonia in the proper clinical setting. It may be due to atelectasis. There lungs are hyperexpanded but otherwise clear. Cardiac silhouette is normal in size. There stable changes from previous CABG surgery. No mediastinal or hilar masses or evidence of adenopathy. No pleural effusion or pneumothorax. IMPRESSION: Left lung base opacity consistent with pneumonia or atelectasis. Electronically Signed   By: Dedra Skeens.D.  On: 10/18/2015 14:17        Filed Weights   10/21/15 1205 10/22/15 0500 10/23/15 0516  Weight: 59.9 kg (132 lb) 60.1 kg (132 lb 8 oz) 59.9 kg (132 lb 1.6 oz)     Microbiology: No results found for this or any previous visit (from the past 240 hour(s)).     Blood Culture    Component Value Date/Time   SDES URINE, RANDOM 08/28/2015 1831   SPECREQUEST Immunocompromised 08/28/2015 1831   CULT MULTIPLE SPECIES PRESENT, SUGGEST RECOLLECTION (A) 08/28/2015 1831   REPTSTATUS 08/30/2015 FINAL 08/28/2015 1831      Labs: Results for orders placed or performed during the hospital encounter of 10/18/15 (from the past 48 hour(s))  Glucose, capillary     Status: Abnormal   Collection Time: 10/21/15  4:11 PM  Result Value Ref Range   Glucose-Capillary 134 (H) 65 - 99 mg/dL  Glucose, capillary     Status: Abnormal   Collection Time: 10/21/15  9:24 PM  Result Value Ref Range   Glucose-Capillary 124 (H) 65 - 99 mg/dL  Basic metabolic panel     Status: Abnormal   Collection Time: 10/22/15  6:16 AM  Result Value Ref Range   Sodium 141 135 - 145 mmol/L   Potassium 4.3 3.5 - 5.1 mmol/L    Chloride 116 (H) 101 - 111 mmol/L   CO2 18 (L) 22 - 32 mmol/L   Glucose, Bld 110 (H) 65 - 99 mg/dL   BUN 13 6 - 20 mg/dL   Creatinine, Ser 1.34 (H) 0.44 - 1.00 mg/dL   Calcium 8.3 (L) 8.9 - 10.3 mg/dL   GFR calc non Af Amer 38 (L) >60 mL/min   GFR calc Af Amer 44 (L) >60 mL/min    Comment: (NOTE) The eGFR has been calculated using the CKD EPI equation. This calculation has not been validated in all clinical situations. eGFR's persistently <60 mL/min signify possible Chronic Kidney Disease.    Anion gap 7 5 - 15  CBC     Status: Abnormal   Collection Time: 10/22/15  6:16 AM  Result Value Ref Range   WBC 6.2 4.0 - 10.5 K/uL   RBC 2.94 (L) 3.87 - 5.11 MIL/uL   Hemoglobin 8.4 (L) 12.0 - 15.0 g/dL   HCT 26.9 (L) 36.0 - 46.0 %   MCV 91.5 78.0 - 100.0 fL   MCH 28.6 26.0 - 34.0 pg   MCHC 31.2 30.0 - 36.0 g/dL   RDW 18.2 (H) 11.5 - 15.5 %   Platelets 284 150 - 400 K/uL  Glucose, capillary     Status: Abnormal   Collection Time: 10/22/15  7:26 AM  Result Value Ref Range   Glucose-Capillary 112 (H) 65 - 99 mg/dL  Glucose, capillary     Status: Abnormal   Collection Time: 10/22/15 11:07 AM  Result Value Ref Range   Glucose-Capillary 118 (H) 65 - 99 mg/dL   Comment 1 Notify RN    Comment 2 Document in Chart   CBC     Status: Abnormal   Collection Time: 10/23/15  3:30 AM  Result Value Ref Range   WBC 7.6 4.0 - 10.5 K/uL   RBC 3.03 (L) 3.87 - 5.11 MIL/uL   Hemoglobin 8.6 (L) 12.0 - 15.0 g/dL   HCT 27.5 (L) 36.0 - 46.0 %   MCV 90.8 78.0 - 100.0 fL   MCH 28.4 26.0 - 34.0 pg   MCHC 31.3 30.0 - 36.0 g/dL   RDW  18.2 (H) 11.5 - 15.5 %   Platelets 285 150 - 400 K/uL     Lipid Panel     Component Value Date/Time   CHOL 131 06/30/2015 0820   TRIG 272.0 (H) 06/30/2015 0820   HDL 26.90 (L) 06/30/2015 0820   CHOLHDL 5 06/30/2015 0820   VLDL 54.4 (H) 06/30/2015 0820   LDLCALC 49 09/13/2013 0931   LDLDIRECT 59.0 06/30/2015 0820     Lab Results  Component Value Date   HGBA1C 5.6  08/29/2015   HGBA1C 6.4 06/30/2015   HGBA1C 5.6 12/27/2014        HPI :*  [name] [name] a 75 y.o.femalewith multiple medical problems into limited toGERD , chronic kidney disease, coronary artery disease, hypertension, congestive heart failure,and type 2 diabetes problems..Patient presented by EMS to the emergency department for weakness. She was found to be heme positive with a hb of 5.1 down from baseline of 8-9. Patient is on Plavix, she takes a baby aspirin every day. Given 2 units PRBC Gi consulted, EGD negative, colonoscopy s/p polypectomy, negative for source of blood loss, patient also status post capsule endoscopy  HOSPITAL COURSE * GI bleed/Acute blood loss anemia Acute blood loss on chronic dz anemia in setting ASA/Plavix.  S/p PRBCs x 2 on 8/12  10/19/15 EGD Normal  10/21/15 colonoscopy: polypectomies x 4. Left colon tics 8/15 capsule endo: completed . Results of capsule endoscopy not available until [place] next week Hemoglobin stable for 48 hours  Hold Plavix until results of capsule endoscopy available, continue aspirin, follow CBC closely  Acute blood loss anemia -Baseline hemoglobin mid 8 to mid 9 range.  -Hb 5.1 on admission, now 8.4  -s/p 2 units PRBC, Hb improved to 8.4,  -iron defi anemia noted from 10/01/15  AKI superimposed on CKD 3 . Baseline Cr 1.39, up to 1.79> now 1.34 prior to discharge  -improving -back to baseline  Abnormal CXR -CXr with left lung base pneumonia versus atelectasis. No coughing, no respiratory distress. Normal white count..Will hold off on Antibiotics, monitor for now -needs FU CXR in 3-4weeks  DM2. Followed outpatient by Endocrine -hold home insulin    Hx of diastolic dysfunction,grade 2 on June 2017 echo. EF preserved at 55% -stopped IVF, monitor -euvolemic at this time  Hypothyroidism. . -Continue home Synthroid  CAD, remote CABG -stable, hold ASA/plavix, continue toprol/statin -followed by  Dr.Berry -last stress test in 9/13 was non ischemic, on medical management per Cards  PAD -s/p Bilateral CEA, aortobifem by pass and R fem pop bypass -hold ASA/Plavix at this time  H/o retroperitoneal fibrosis and R hydronephrosis -s/p R ureteral stents followed by Dr.Herrick, exchanging q 86month    Discharge Exam:  * Blood pressure (!) 144/61, pulse (!) 58, temperature 98.1 F (36.7 C), temperature source Oral, resp. rate 16, height '5\' 6"'  (1.676 m), weight 59.9 kg (132 lb 1.6 oz), SpO2 98 %.  General exam: Appears calm and comfortable  Respiratory system: Clear to auscultation. Respiratory effort normal. Cardiovascular system: S1 & S2 heard, RRR. No JVD, murmurs, rubs, gallops or clicks. No pedal edema. Gastrointestinal system: Abdomen is nondistended, soft and nontender. No organomegaly or masses felt. Normal bowel sounds heard. Central nervous system: Alert and oriented. No focal neurological deficits. Extremities: Symmetric 5 x 5 power. Skin: No rashes, lesions or ulcers Psychiatry: Judgement and insight appear normal. Mood & affect appropriate    Follow-up Information    KUMAR,AJAY, MD. Schedule an appointment as soon as possible for a visit in  2 day(s).   Specialty:  Endocrinology Why:  Hospital follow-up Contact information: [address] [place] [address] [phone]           Signed: Reyne [facility] 10/23/2015, 12:10 PM        Time spent >45 mins

## 2015-10-23 NOTE — Progress Notes (Signed)
Discharge instructions reviewed with patient. Patient has no questions at this time. IV d/c. Patient waiting on daughter to her pick up.

## 2015-10-23 NOTE — Care Management Important Message (Signed)
Important Message  Patient Details  Name: [name] [name] MRN: [id number] Date of Birth: 1940-11-13   Medicare Important Message Given:  Yes    [name] 10/23/2015, 11:45 AM

## 2015-10-23 NOTE — Progress Notes (Signed)
Daily Rounding Note  10/23/2015, 9:27 AM  LOS: 5 days   SUBJECTIVE:   Chief complaint:     No BM.  No complaints  OBJECTIVE:         Vital signs in last 24 hours:    Temp:  [97.7 F (36.5 C)-98.2 F (36.8 C)] 98.1 F (36.7 C) (08/17 0516) Pulse Rate:  [58-74] 58 (08/17 0516) Resp:  [16] 16 (08/17 0516) BP: (128-187)/(51-69) 128/51 (08/17 0516) SpO2:  [98 %-100 %] 98 % (08/17 0516) Weight:  [59.9 kg (132 lb 1.6 oz)] 59.9 kg (132 lb 1.6 oz) (08/17 0516) Last BM Date: 10/20/15 Filed Weights   10/21/15 1205 10/22/15 0500 10/23/15 0516  Weight: 59.9 kg (132 lb) 60.1 kg (132 lb 8 oz) 59.9 kg (132 lb 1.6 oz)   General: pale, somewhat chronically ill looking   Heart: RRR Chest: clear bil.  No dyspnea or cough Abdomen: soft, NT, ND.  Active BS  Extremities: no CCE Neuro/Psych:  Pleasant, cooperative.  No weakness. Oriented x 3 and fully alert.   Intake/Output from previous day: 08/16 0701 - 08/17 0700 In: 720 [P.O.:720] Out: 1550 [Urine:1550]  Intake/Output this shift: Total I/O In: 360 [P.O.:360] Out: 350 [Urine:350]  Lab Results:  Recent Labs  10/21/15 0420 10/22/15 0616 10/23/15 0330  WBC 5.6 6.2 7.6  HGB 10.0* 8.4* 8.6*  HCT 32.6* 26.9* 27.5*  PLT 308 284 285   BMET  Recent Labs  10/21/15 0420 10/22/15 0616  NA 144 141  K 4.0 4.3  CL 117* 116*  CO2 18* 18*  GLUCOSE 104* 110*  BUN 15 13  CREATININE 1.31* 1.34*  CALCIUM 8.6* 8.3*   LFT No results for input(s): PROT, ALBUMIN, AST, ALT, ALKPHOS, BILITOT, BILIDIR, IBILI in the last 72 hours. PT/INR No results for input(s): LABPROT, INR in the last 72 hours. Hepatitis Panel No results for input(s): HEPBSAG, HCVAB, HEPAIGM, HEPBIGM in the last 72 hours.  Studies/Results: No results found.  ASSESMENT:   *  Acute blood loss on chronic dz anemia in setting ASA/Plavix.  S/p PRBCs x 2 on 8/12   10/19/15 EGD Normal  10/21/15 colonoscopy:  polypectomies x 4.  Left colon tics 8/15 capsule endo: completed late that day.  Will not be read for at least 24 hours Hgb down another 1.5 grams in last 24 hours but the levels are c/w 48 hours ago   *  CAD/CABG 1997, carotid dz/CEA, PVD. Plavix ASA on hold   PLAN   *  Discharge home today.    *  Capsule endo not yet read.   *  GI prefers she stay off Plavix until GI can read capsule endoscopy, ok to resume low dose ASA at discharge.  Do not see Cardiologists input in charting.   *  Has scheduled lab work at [facility] office set for late next week with follow up office visit previously scheduled for the following week.  She should have CBC drawn if not already ordered.      Azucena Freed  10/23/2015, 9:27 AM Pager: [phone]  I have discussed the case with the PA, and that is the plan I formulated. I personally interviewed and examined the patient.  CC: melena  Her hgb is stable, and I feel she can be discharged home today on aspirin and off plavix (for now) if agreeable to patient's cardiologist. Our practice will read her video capsule study be [place] next week and contact her  for follow up. Ms Dann knows to return to the hospital if she develops recurrent melena.    Nelida Meuse III Pager [phone]  Mon-Fri 8a-5p [phone] after 5p, weekends, holidays

## 2015-10-23 NOTE — Consult Note (Signed)
   [facility] CM Inpatient Consult   10/23/2015  KINZLEY SAVELL 1941/02/22 [id number]  Met with the patient regarding the benefits of [facility] services.Explained that [facility] is a covered benefit of insurance. Review information for [facility] Care Management and a folder was provided with contact information.  Explained that [facility] does not interfere with or replace any services arranged by the inpatient care management staff.  Patient declined services with [facility].  Patient states, "I will have [facility] and they will be monitoring me."  Explained the difference, patient alerted to the 24 hour nurse advise line.  Patient states she is appreciative of the services but doesn't feel the need for both.  Encouraged patient to call if needs arise. A brochure with contact information was given as well.  For questions, please contact:  Natividad Brood, RN BSN [facility] Liaison  [phone] business mobile phone Toll free office [phone]

## 2015-10-23 NOTE — Progress Notes (Signed)
Alerted donna w adv homecare of pt for disch home today.

## 2015-10-24 ENCOUNTER — Telehealth: Payer: Self-pay | Admitting: *Deleted

## 2015-10-24 NOTE — Telephone Encounter (Signed)
Pt states the areas where Dr. Paulla Dolly cut off callouses are sore.  I told pt to daily for the next 3-4 days soak in warm salt water, and cover with neosporing ointment with lidocaine, report redness, streaking or drainage to our office and we will get her in to be seen. Pt states understanding.

## 2015-10-27 ENCOUNTER — Encounter: Payer: Self-pay | Admitting: *Deleted

## 2015-10-29 ENCOUNTER — Telehealth: Payer: Self-pay | Admitting: Gastroenterology

## 2015-10-29 NOTE — Telephone Encounter (Signed)
I read [name] [name]'s video capsule study.  There was no active bleeding.  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  Very common cause of the type of bleeding she had.  They cannot be reached with a scope to get rid of them. The plan is stay on aspirin, OFF plavix, continue iron tablets at current dose and see Dr Hilarie Fredrickson as scheduled on 9/6 to discuss findings and check blood count

## 2015-10-30 NOTE — Telephone Encounter (Signed)
Unable to reach pt by phone, letter mailed to pt. 

## 2015-11-03 ENCOUNTER — Other Ambulatory Visit (INDEPENDENT_AMBULATORY_CARE_PROVIDER_SITE_OTHER): Payer: Medicare Other

## 2015-11-03 ENCOUNTER — Other Ambulatory Visit: Payer: Self-pay

## 2015-11-03 ENCOUNTER — Ambulatory Visit (INDEPENDENT_AMBULATORY_CARE_PROVIDER_SITE_OTHER): Payer: Medicare Other | Admitting: Endocrinology

## 2015-11-03 ENCOUNTER — Encounter: Payer: Self-pay | Admitting: Endocrinology

## 2015-11-03 VITALS — BP 131/52 | HR 69 | Ht 66.0 in | Wt 135.0 lb

## 2015-11-03 DIAGNOSIS — E119 Type 2 diabetes mellitus without complications: Secondary | ICD-10-CM | POA: Diagnosis not present

## 2015-11-03 DIAGNOSIS — D649 Anemia, unspecified: Secondary | ICD-10-CM | POA: Diagnosis not present

## 2015-11-03 DIAGNOSIS — E063 Autoimmune thyroiditis: Secondary | ICD-10-CM

## 2015-11-03 DIAGNOSIS — N183 Chronic kidney disease, stage 3 unspecified: Secondary | ICD-10-CM

## 2015-11-03 DIAGNOSIS — M545 Low back pain, unspecified: Secondary | ICD-10-CM

## 2015-11-03 DIAGNOSIS — I6523 Occlusion and stenosis of bilateral carotid arteries: Secondary | ICD-10-CM | POA: Diagnosis not present

## 2015-11-03 DIAGNOSIS — E038 Other specified hypothyroidism: Secondary | ICD-10-CM

## 2015-11-03 LAB — CBC WITH DIFFERENTIAL/PLATELET
BASOS PCT: 0.4 % (ref 0.0–3.0)
Basophils Absolute: 0 10*3/uL (ref 0.0–0.1)
EOS PCT: 4.7 % (ref 0.0–5.0)
Eosinophils Absolute: 0.3 10*3/uL (ref 0.0–0.7)
HEMATOCRIT: 30.4 % — AB (ref 36.0–46.0)
HEMOGLOBIN: 10 g/dL — AB (ref 12.0–15.0)
Lymphocytes Relative: 19.1 % (ref 12.0–46.0)
Lymphs Abs: 1.3 10*3/uL (ref 0.7–4.0)
MCHC: 33.1 g/dL (ref 30.0–36.0)
MCV: 89.3 fl (ref 78.0–100.0)
MONOS PCT: 8.1 % (ref 3.0–12.0)
Monocytes Absolute: 0.6 10*3/uL (ref 0.1–1.0)
Neutro Abs: 4.7 10*3/uL (ref 1.4–7.7)
Neutrophils Relative %: 67.7 % (ref 43.0–77.0)
Platelets: 204 10*3/uL (ref 150.0–400.0)
RBC: 3.4 Mil/uL — AB (ref 3.87–5.11)
RDW: 18 % — AB (ref 11.5–15.5)
WBC: 7 10*3/uL (ref 4.0–10.5)

## 2015-11-03 LAB — COMPREHENSIVE METABOLIC PANEL
ALBUMIN: 3.3 g/dL — AB (ref 3.5–5.2)
ALK PHOS: 65 U/L (ref 39–117)
ALT: 6 U/L (ref 0–35)
AST: 11 U/L (ref 0–37)
BUN: 31 mg/dL — AB (ref 6–23)
CALCIUM: 8.3 mg/dL — AB (ref 8.4–10.5)
CO2: 25 mEq/L (ref 19–32)
Chloride: 108 mEq/L (ref 96–112)
Creatinine, Ser: 1.68 mg/dL — ABNORMAL HIGH (ref 0.40–1.20)
GFR: 31.61 mL/min — AB (ref >60.00)
Glucose, Bld: 152 mg/dL — ABNORMAL HIGH (ref 70–99)
POTASSIUM: 5.1 meq/L (ref 3.5–5.1)
Sodium: 139 mEq/L (ref 135–145)
TOTAL PROTEIN: 6.1 g/dL (ref 6.0–8.3)
Total Bilirubin: 0.2 mg/dL (ref 0.2–1.2)

## 2015-11-03 MED ORDER — HYDROCODONE-ACETAMINOPHEN 5-325 MG PO TABS: ORAL_TABLET | ORAL | 0 refills | Status: DC

## 2015-11-03 NOTE — Progress Notes (Signed)
Please let patient know that the anemia is better Kidney to slightly worse, increase fluid intake and avoid any medications like Advil

## 2015-11-03 NOTE — Patient Instructions (Signed)
Take iron daily  Check blood sugars on waking up 3x per week   Also check blood sugars about 2 hours after a meal and do this after different meals by rotation  Recommended blood sugar levels on waking up is 90-130 and about 2 hours after meal is 130-160  Please bring your blood sugar monitor to each visit, thank you

## 2015-11-03 NOTE — Progress Notes (Signed)
Patient ID: [name] [name], female   DOB: [birth year], 75 y.o.   MRN: [id number]   Reason for Appointment: Follow-up of various problems, post hospital visit  History of Present Illness   GI bleeding:  She had severe anemia in the hospital and endoscopy did not show a source of bleeding, partly had capsule endoscopy and no information about this is available in her record After transfusion she was discharged, due to follow-up with gastroenterologist Currently taking iron only 3 times a week, previously had constipation with this   Lab Results  Component Value Date   WBC 7.6 10/23/2015   HGB 8.6 (L) 10/23/2015   HCT 27.5 (L) 10/23/2015   MCV 90.8 10/23/2015   PLT 285 10/23/2015      RENAL dysfunction:  Renal function is Stable No recurrence of hyperkalemia   Lab Results  Component Value Date   CREATININE 1.34 (H) 10/22/2015   CREATININE 1.31 (H) 10/21/2015   CREATININE 1.34 (H) 10/20/2015   CREATININE 1.57 (H) 10/19/2015   Lab Results  Component Value Date   CREATININE 1.34 (H) 10/22/2015   BUN 13 10/22/2015   NA 141 10/22/2015   K 4.3 10/22/2015   CL 116 (H) 10/22/2015   CO2 18 (L) 10/22/2015      Type 2 DIABETES mellitus, date of diagnosis: 1986.   The insulin regimen is: Levemir 18 units hs, Novolog 6-8 ac twice a day   Type 2 diabetes  has been treated in the last few years with low dose basal bolus insulin regimen. She has not been taking  any oral hypoglycemic drugs including metformin partly because of GI side effects  A1c is fairly good at 5.6 in June  She is checking her blood sugar at various times but infrequently recently Blood sugars are variable in the mornings but fairly good the last 3 days, recent range 107-182 before noon Evening blood sugars: 87-144 She is compliant with her insulin and probably taking mostly 6 units instead of 8 of NovoLog at mealtimes Appetite is fairly good and no hypoglycemia present t  Side effects  from medications: Diarrhea from metformin and nausea and vomiting from GLP-1 drugs   Monitors blood glucose: Less than 1 times a day  Glucometer: One Touch.    Blood Glucose readings: As above  Meals: she is usually eating low fat meals especially recently meals usually at 11 AM,and supper at 5 pm.  Calorie intake: Usually controlled. Moderate Carbs Physical activity: exercise: Unable to do any   Dietician visit: Most recent:, 5/13.   Wt Readings from Last 3 Encounters:  11/03/15 135 lb (61.2 kg)  10/23/15 132 lb 1.6 oz (59.9 kg)  10/08/15 138 lb (62.6 kg)    Lab Results  Component Value Date   HGBA1C 5.6 08/29/2015   HGBA1C 6.4 06/30/2015   HGBA1C 5.6 12/27/2014   Lab Results  Component Value Date   MICROALBUR 119.4 (H) 06/30/2015   LDLCALC 49 09/13/2013   CREATININE 1.34 (H) 10/22/2015     3.  HYPOTHYROIDISM: She has had long-standing primary hypothyroidism TSH has been low Normal  usually and because of her level of 0.21 she was told to take only 6-1/2 tablets a week of her 125 g dose   Lab Results  Component Value Date   TSH 0.496 08/29/2015   TSH 0.34 (L) 08/27/2015   TSH 0.62 06/30/2015   FREET4 1.09 06/30/2015   FREET4 1.33 07/22/2014   FREET4 1.29 04/24/2014  Medication List       Accurate as of 11/03/15  3:11 PM. Always use your most recent med list.          albuterol 108 (90 Base) MCG/ACT inhaler Commonly known as:  PROVENTIL HFA;VENTOLIN HFA Inhale 2 puffs into the lungs every 6 (six) hours as needed for wheezing.   aspirin 81 MG chewable tablet Chew 81 mg by mouth at bedtime.   calcium-vitamin D 500-200 MG-UNIT tablet Commonly known as:  OSCAL WITH D Take 1 tablet by mouth every morning.   Cranberry 250 MG Tabs Take 1 tablet by mouth daily.   feeding supplement (GLUCERNA SHAKE) Liqd Take 237 mLs by mouth 3 (three) times daily between meals.   ferrous sulfate 325 (65 FE) MG tablet Take 325 mg by mouth every evening.     gabapentin 300 MG capsule Commonly known as:  NEURONTIN Take 300 mg by mouth 3 (three) times daily as needed. For pain on the bottom of feet per patient   gabapentin 300 MG capsule Commonly known as:  NEURONTIN Take 1 capsule (300 mg total) by mouth 3 (three) times daily as needed.   HYDROcodone-acetaminophen 5-325 MG tablet Commonly known as:  NORCO/VICODIN Take 1 tablet every 6 hours as needed for back pain   insulin aspart 100 UNIT/ML FlexPen Commonly known as:  NOVOLOG Inject 8 units three times a day with meals   Insulin Detemir 100 UNIT/ML Pen Commonly known as:  LEVEMIR FLEXTOUCH Inject 22 units at bedtime.   latanoprost 0.005 % ophthalmic solution Commonly known as:  XALATAN Place 1 drop into both eyes at bedtime.   levothyroxine 125 MCG tablet Commonly known as:  SYNTHROID, LEVOTHROID Take 125 mcg by mouth daily before breakfast.   metoprolol succinate 25 MG 24 hr tablet Commonly known as:  TOPROL-XL TAKE 1/2 TABLET (=12.5MG )  EVERY MORNING   omeprazole 20 MG capsule Commonly known as:  PRILOSEC TAKE 1 CAPSULE DAILY   Pen Needles 31G X 6 MM Misc Use to inject insulin 4 times per day.   polyethylene glycol packet Commonly known as:  MIRALAX / GLYCOLAX Take 17 g by mouth daily as needed for moderate constipation.   rosuvastatin 20 MG tablet Commonly known as:  CRESTOR Take 1 tablet (20 mg total) by mouth daily.   VITAMIN B 12 PO Take 1 tablet by mouth daily.   vitamin C 500 MG tablet Commonly known as:  ASCORBIC ACID Take 500 mg by mouth every morning.   Vitamin D (Ergocalciferol) 50000 units Caps capsule Commonly known as:  DRISDOL TAKE 1 CAPSULE BY MOUTH EVERY 7 DAYS ON THURSDAYS       Allergies: No Known Allergies  Past Medical History:  Diagnosis Date  . AAA (abdominal aortic aneurysm) ([place])   . Bilateral hydronephrosis   . Carotid artery stenosis    carotid doppler 06/2012 - Right CCA/Bulb/ICA with chronic occlusion; L vertebral artery  with abnormal blood flow; L Bulb/Prox ICA  s/p endarterectomy with mild fibrous plaque, 50% diameter reduction  . CHF (congestive heart failure) ([place])   . Choledocholithiasis 2017  . CKD (chronic kidney disease), stage III   . Coronary artery disease due to lipid rich plaque cardiologist-  dr [facility]   s/p CABG x6 1997-- cath 12-09-2009 occluded vein to obtuse marginal branch and ramus branch with patent vien to PDA and patent LIMA to LAD, ef 40%-- Myoview 11-24-2011, nonischemic  . Diverticulosis   . Dyspnea on exertion   . GERD (gastroesophageal reflux  disease)   . GI bleed   . History of sepsis    10-18-2014 w/ acute pyelonephritis  . Hyperlipidemia   . Hypertension   . Hypothyroidism   . Ischemic cardiomyopathy    03-25-2010-- per lasts echo EF  50-55%  . PAD (peripheral artery disease) ([place])    09/2010 LEAs - R ABI of 0.45, occluded fem-pop bypass graft, L ABI of 0.59 with occluded SFA; severe arterial insuff  . PVD (peripheral vascular disease) with claudication ([place])    last duplex 07-04-2015 -- Right CCA and ICA chronic occlusion, [id number] LICA, Patent vertebral arteries w/ antegrade flow, bilateral normal subclavian arteries   . Retroperitoneal fibrosis   . Tubular adenoma of colon   . Type 2 diabetes mellitus ([place])    monitored by dr Dwyane Dee    Past Surgical History:  Procedure Laterality Date  . AORTA - BILATERAL FEMORAL ARTERY BYPASS GRAFT  1997   and RIGHT FEM-POP   . CARDIAC CATHETERIZATION  12-09-2009  dr al little   EF >40%-- occluded vein to OM & ramus branches, patent vein to PDA, patent LIMA to LAD (Dr. [name], [facility]) - later had thrombectomy of R fem-pop bypass ad R common femoral & profunda femoris artery (Dr. Oneida Alar)  . CARDIOVASCULAR STRESS TEST  11-24-2011   dr berry   Low Risk study: fixed basal to mid inferior attenuation artifact, no reversible ischemia,  normal LV function and wall motion , ef 67%  . CAROTID ENDARTERECTOMY Bilateral right 1994//  left ?  Marland Kitchen  CATARACT EXTRACTION W/ INTRAOCULAR LENS  IMPLANT, BILATERAL  2006  . CHOLECYSTECTOMY N/A 09/05/2015   Procedure: ATTEMPTED LAPAROSCOPIC CHOLECYSTECTOMY, EXPLORATORY LAPAROTOMY WITH CHOLECYSTECTOMY;  Surgeon: Autumn Messing III, MD;  Location: [place];  Service: General;  Laterality: N/A;  . COLONOSCOPY    . COLONOSCOPY  10/21/2015  . COLONOSCOPY WITH PROPOFOL N/A 10/21/2015   Procedure: COLONOSCOPY WITH PROPOFOL;  Surgeon: Milus Banister, MD;  Location: [place];  Service: Endoscopy;  Laterality: N/A;  . CORONARY ARTERY BYPASS GRAFT  1997   x6; internal mammary to LAD, SVG to ramus #1 & #2, SVG to OM, SVG to PDA,   . CYSTOSCOPY W/ RETROGRADES Right 08/01/2015   Procedure: CYSTOSCOPY WITH RETROGRADE PYELOGRAM;  Surgeon: Ardis Hughs, MD;  Location: [facility];  Service: Urology;  Laterality: Right;  . CYSTOSCOPY W/ URETERAL STENT PLACEMENT  03/10/2012   Procedure: CYSTOSCOPY WITH RETROGRADE PYELOGRAM/URETERAL STENT PLACEMENT;  Surgeon: Hanley Ben, MD;  Location: [facility];  Service: Urology;  Laterality: Left;  . CYSTOSCOPY W/ URETERAL STENT PLACEMENT Bilateral 03/07/2015   Procedure: BILATERAL RETROGRADE PYELOGRAM AND RIGHT URETERAL STENT PLACEMENT;  Surgeon: Ardis Hughs, MD;  Location[facility];  Service: Urology;  Laterality: Bilateral;  . CYSTOSCOPY W/ URETERAL STENT PLACEMENT Right 08/01/2015   Procedure: CYSTOSCOPY WITH STENT REPLACEMENT;  Surgeon: Ardis Hughs, MD;  Location[facility];  Service: Urology;  Laterality: Right;  . ERCP N/A 09/03/2015   Procedure: ENDOSCOPIC RETROGRADE CHOLANGIOPANCREATOGRAPHY (ERCP);  Surgeon: Irene Shipper, MD;  Location: [facility] ENDOSCOPY;  Service: Endoscopy;  Laterality: N/A;  . ESOPHAGOGASTRODUODENOSCOPY    . ESOPHAGOGASTRODUODENOSCOPY N/A 10/19/2015   Procedure: ESOPHAGOGASTRODUODENOSCOPY (EGD);  Surgeon: Gatha Mayer, MD;  Location: [facility] ENDOSCOPY;  Service: Endoscopy;  Laterality: N/A;  . GIVENS  CAPSULE STUDY  10/21/2015  . GIVENS CAPSULE STUDY N/A 10/21/2015   Procedure: GIVENS CAPSULE STUDY;  Surgeon: Milus Banister, MD;  Location: [place];  Service:  Endoscopy;  Laterality: N/A;  . REPAIR RIGHT FEMORAL PSEUDOANEUYSM/  RIGHT FEM-POP BYPASS GRAFT/  DEBRIDEMENT RIGHT LOWER EXTREMITIY VENOUS STATUS ULCERS X2  01-12-2005  . TOTAL ABDOMINAL HYSTERECTOMY W/ BILATERAL SALPINGOOPHORECTOMY  1986  . TRANSTHORACIC ECHOCARDIOGRAM  03/25/2010   EF 99991111, LV systolic function low normal with mild inferoseptal hypocontractility; LA mildly dilated; mod MR; mild TR, RV systolic pressure elevated, mild pulm HTN; AV mildly sclerotic; mild pulm valve regurg; aortic root sclerosis/calcif     Family History  Problem Relation Age of Onset  . Congestive Heart Failure Mother   . Diabetes Mother   . Stroke Father   . Cancer Maternal Aunt     Breast cancer    Social History:  reports that she quit smoking about 6 years ago. She has a 60.00 pack-year smoking history. She has never used smokeless tobacco. She reports that she drinks about 1.8 oz of alcohol per week . She reports that she does not use drugs.  Review of Systems -     Hyperlipidemia: Has history of high LDL and triglycerides/low HDL  Was changed from Lipitor to Crestor because of higher LDL and she was not wanting to pay the higher cost of adding Zetia   LDL is improved partly from her weight loss and decreased appetite  Tends to have high triglycerides also despite taking fenofibrate   Lab Results  Component Value Date   CHOL 131 06/30/2015   HDL 26.90 (L) 06/30/2015   LDLCALC 49 09/13/2013   LDLDIRECT 59.0 06/30/2015   TRIG 272.0 (H) 06/30/2015   CHOLHDL 5 06/30/2015   .  Iron Deficiency anemia:  See discussion above   She is taking her B12 as before  Lab Results  Component Value Date   WBC 7.6 10/23/2015   HGB 8.6 (L) 10/23/2015   HCT 27.5 (L) 10/23/2015   MCV 90.8 10/23/2015   PLT 285 10/23/2015   Lab Results    Component Value Date   OCCULTBLD POSITIVE (A) 10/18/2015   OCCULTBLD NEGATIVE 08/28/2015   OCCULTBLD POSITIVE 11/28/2009    Vitamin D deficiency: She has been on supplements and taking her weekly dose, Had a therapeutic level in 1/17   She is Has recurrent low back pain which is chronic and wants a refill of her hydrocodone, 60 tablets given today   Examination:   BP (!) 131/52   Pulse 69   Ht 5\' 6"  (1.676 m)   Wt 135 lb (61.2 kg)   LMP  (Exact Date)   BMI 21.79 kg/m   Body mass index is 21.79 kg/m.       Assesment/PLAN:   ANEMIA: She has persistent and worsening anemia Also recent GI bleeding of unclear etiology Plavix has been stopped Still awaiting further evaluation from gastroenterologist and follow-up  Recommendations:  Increase iron to daily   Follow-up with gastroenterologist  Hemoglobin to be checked today Consider iron infusion of iron still significantly low  DIABETES: Blood sugars have reasonably well-controlled She will continue her  current basal bolus insulin  HYPOTHYROIDISM: Her TSH will be followed on the next visit  History of renal dysfunction: Need follow-up, currently not on any diuretics or agents to worsening renal function  Labs to be reviewed today and further management discussed with her  Total visit time for review of multiple problems, hospital records, counseling 25 minutes   Jeslin Bazinet  11/03/15   Addendum: Hemoglobin improved, renal function slightly worse.  Will need follow-up

## 2015-11-12 ENCOUNTER — Encounter: Payer: Self-pay | Admitting: Internal Medicine

## 2015-11-12 ENCOUNTER — Encounter (INDEPENDENT_AMBULATORY_CARE_PROVIDER_SITE_OTHER): Payer: Self-pay

## 2015-11-12 ENCOUNTER — Ambulatory Visit (INDEPENDENT_AMBULATORY_CARE_PROVIDER_SITE_OTHER): Payer: Medicare Other | Admitting: Internal Medicine

## 2015-11-12 ENCOUNTER — Telehealth: Payer: Self-pay | Admitting: Cardiovascular Disease

## 2015-11-12 VITALS — BP 124/72 | HR 74 | Ht 66.0 in | Wt 139.2 lb

## 2015-11-12 DIAGNOSIS — K5520 Angiodysplasia of colon without hemorrhage: Secondary | ICD-10-CM | POA: Diagnosis not present

## 2015-11-12 DIAGNOSIS — I6523 Occlusion and stenosis of bilateral carotid arteries: Secondary | ICD-10-CM

## 2015-11-12 DIAGNOSIS — D509 Iron deficiency anemia, unspecified: Secondary | ICD-10-CM

## 2015-11-12 NOTE — Telephone Encounter (Signed)
Provider acknowledged, patient to follow up as scheduled.

## 2015-11-12 NOTE — Telephone Encounter (Signed)
That is fine with me.

## 2015-11-12 NOTE — Telephone Encounter (Signed)
Pt saw Dr. Hilarie Fredrickson today - stopped plavix due to finding of AVM.  Attempted to return Dottie's call, unable to reach at this time. Routed to Dr. Gwenlyn Found as FYI/for recommendations.

## 2015-11-12 NOTE — Telephone Encounter (Signed)
New Message   Dottie from Dr. Clare Charon office states Dr. Daryll Brod has stop pts plavix because of finding of small AVM. An appt was make for pt to discuss the plavix with the AVM on 9/12 with Dr. Gwenlyn Found.

## 2015-11-12 NOTE — Progress Notes (Signed)
Subjective:    Patient ID: [name] [name], female    DOB: 08-31-1940, 75 y.o.   MRN: [id number]  HPI [name] [name] is a 75 year old female with a history of iron deficiency anemia, small bowel angiodysplasias, colon polyps, choledocholithiasis status post ERCP and cholecystectomy who is here for follow-up after recent hospital discharge. She was in the hospital and seen by the GI consult team between 10/18/2015 and discharge on 10/23/2015. She presented with melena and profound anemia requiring transfusion. Her hemoglobin is noted to be 5 g/dL. Her Plavix was held and she underwent endoscopic evaluation. Colonoscopy was performed on 10/21/2015 by Dr. Ardis Hughs. This revealed 4 subcentimeter colon polyps removed from the cecum, ascending and transverse colon. These were found to be tubular adenomas. There was left colon diverticulosis. EGD was performed on 10/19/2015 by Silvano Rusk which was normal. She then had capsule endoscopy performed prior to discharge and showed multiple nonbleeding angiodysplastic lesions in the mid and distal bowel.   She is followed up with her primary care Dr. Dwyane Dee. Recent hemoglobin was rechecked 9 days ago and increased to 10.0. She reports that she's been feeling well. Good appetite. No nausea or vomiting. She is taking oral iron daily. She has not resumed Plavix. She reports her bowel movements is regular she's actually had less constipation recently. She denies melena or rectal bleeding. She denies abdominal pain. She is taking Prilosec 20 mg daily and MiraLAX as needed.  Review of Systems As per history of present illness, otherwise negative  Current Medications, Allergies, Past Medical History, Past Surgical History, Family History and Social History were reviewed in [employer] record.     Objective:   Physical Exam BP 124/72   Pulse 74   Ht 5\' 6"  (1.676 m)   Wt 139 lb 3 oz (63.1 kg)   LMP  (Exact Date)   BMI 22.47 kg/m    Constitutional: Well-developed and well-nourished. No distress. HEENT: Normocephalic and atraumatic. Oropharynx is clear and moist. No oropharyngeal exudate. Conjunctivae are pale.  No scleral icterus. Neck: Neck supple. Trachea midline.i. Abdominal: Soft, nontender, nondistended. Bowel sounds active throughout. Well-healed right upper quadrant transverse scar Extremities: no clubbing, cyanosis, or edema Neurological: Alert and oriented to person place and time. Skin: Skin is warm and dry.  Psychiatric: Normal mood and affect. Behavior is normal.  CBC    Component Value Date/Time   WBC 7.0 11/03/2015 1550   RBC 3.40 (L) 11/03/2015 1550   HGB 10.0 (L) 11/03/2015 1550   HCT 30.4 (L) 11/03/2015 1550   PLT 204.0 11/03/2015 1550   MCV 89.3 11/03/2015 1550   MCH 28.4 10/23/2015 0330   MCHC 33.1 11/03/2015 1550   RDW 18.0 (H) 11/03/2015 1550   LYMPHSABS 1.3 11/03/2015 1550   MONOABS 0.6 11/03/2015 1550   EOSABS 0.3 11/03/2015 1550   BASOSABS 0.0 11/03/2015 1550   Iron/TIBC/Ferritin/ %Sat    Component Value Date/Time   IRON 33 (L) 10/01/2015 1630   TIBC 199 (L) 11/15/2009 0520   FERRITIN 178 11/15/2009 0520   IRONPCTSAT 13.7 (L) 10/01/2015 1630       Assessment & Plan:  75 year old female with a history of iron deficiency anemia, small bowel angiodysplasias, colon polyps, choledocholithiasis status post ERCP and cholecystectomy who is here for follow-up after recent hospital discharge.1  1. Small bowel angiodysplastic lesions/iron deficiency anemia -- she did have a profound anemia likely from multiple small bowel angiodysplastic lesions. These lesions are in the mid and distal bowel and  would be very difficult, if not impossible to reach with even deep enteroscopy at a tertiary care center. Currently her Plavix has been on hold and she's had no further melena with increasing hemoglobin. I recommend she continue oral iron supplementation with close monitoring of hemoglobin and iron  studies. If iron remains low despite oral iron I would recommend IV iron infusions. She has follow-up labs already in place and follow-up later this month with Dr. Dwyane Dee, her PCP. She will remain on low-dose omeprazole 20 mg daily. Ideally Plavix would remain off but I would recommend that she follow-up with Dr. Gwenlyn Found her cardiologist to let him know of her recent GI bleeding, known small bowel angiodysplastic lesions to have his opinion regarding Plavix. From a GI perspective low-dose aspirin is felt likely safe with close monitoring of blood counts.  25 minutes spent with the patient today. Greater than 50% was spent in counseling and coordination of care with the patient

## 2015-11-12 NOTE — Patient Instructions (Addendum)
You have been scheduled for a follow up with Dr Gwenlyn Found at [facility] location) on 11/18/15 at 9:45 am to discuss Plavix options.  Follow up with Dr Dwyane Dee as scheduled for labs.  Continue oral iron every day.  Follow up with Dr Hilarie Fredrickson in 6 months.  If you are age 74 or older, your body mass index should be between 23-30. Your Body mass index is 22.47 kg/m. If this is out of the aforementioned range listed, please consider follow up with your Primary Care Provider.  If you are age 79 or younger, your body mass index should be between 19-25. Your Body mass index is 22.47 kg/m. If this is out of the aformentioned range listed, please consider follow up with your Primary Care Provider.

## 2015-11-13 NOTE — Progress Notes (Signed)
OK to stop plavix from my point of view

## 2015-11-18 ENCOUNTER — Ambulatory Visit: Payer: Medicare Other | Admitting: Cardiovascular Disease

## 2015-11-26 ENCOUNTER — Other Ambulatory Visit: Payer: Medicare Other

## 2015-11-26 ENCOUNTER — Ambulatory Visit (INDEPENDENT_AMBULATORY_CARE_PROVIDER_SITE_OTHER): Payer: Medicare Other | Admitting: Cardiovascular Disease

## 2015-11-26 ENCOUNTER — Encounter: Payer: Self-pay | Admitting: Cardiovascular Disease

## 2015-11-26 DIAGNOSIS — I2510 Atherosclerotic heart disease of native coronary artery without angina pectoris: Secondary | ICD-10-CM

## 2015-11-26 DIAGNOSIS — I2583 Coronary atherosclerosis due to lipid rich plaque: Principal | ICD-10-CM

## 2015-11-26 NOTE — Patient Instructions (Signed)
Medication Instructions:  NO CHANGES.   Follow-Up: We request that you follow-up in: 6 MONTHS with an extender and in 12 MONTHS with Dr Andria Rhein will receive a reminder letter in the mail two months in advance. If you don't receive a letter, please call our office to schedule the follow-up appointment.   If you need a refill on your cardiac medications before your next appointment, please call your pharmacy.

## 2015-11-26 NOTE — Assessment & Plan Note (Signed)
[name] [name] returns today to discuss the need for Plavix. She has no recent stents. She recently had a GI bleed and was told to come off Plavix which I think is reasonable. She will see mid-level back in 6 months and me back in one year.

## 2015-11-26 NOTE — Progress Notes (Signed)
[name] [name] returns today to discuss the need for Plavix. She has no recent stents. She recently had a GI bleed and was told to come off Plavix which I think is reasonable. She will see mid-level back in 6 months and me back in one year.

## 2015-11-27 ENCOUNTER — Other Ambulatory Visit (INDEPENDENT_AMBULATORY_CARE_PROVIDER_SITE_OTHER): Payer: Medicare Other

## 2015-11-27 DIAGNOSIS — E038 Other specified hypothyroidism: Secondary | ICD-10-CM

## 2015-11-27 DIAGNOSIS — E119 Type 2 diabetes mellitus without complications: Secondary | ICD-10-CM | POA: Diagnosis not present

## 2015-11-27 DIAGNOSIS — D649 Anemia, unspecified: Secondary | ICD-10-CM

## 2015-11-27 DIAGNOSIS — E063 Autoimmune thyroiditis: Secondary | ICD-10-CM

## 2015-11-27 LAB — HEMOGLOBIN A1C: HEMOGLOBIN A1C: 5.4 % (ref 4.6–6.5)

## 2015-11-27 LAB — CBC
HEMATOCRIT: 32.6 % — AB (ref 36.0–46.0)
HEMOGLOBIN: 10.8 g/dL — AB (ref 12.0–15.0)
MCHC: 33.1 g/dL (ref 30.0–36.0)
MCV: 88.9 fl (ref 78.0–100.0)
Platelets: 252 10*3/uL (ref 150.0–400.0)
RBC: 3.66 Mil/uL — AB (ref 3.87–5.11)
RDW: 16.4 % — AB (ref 11.5–15.5)
WBC: 5.7 10*3/uL (ref 4.0–10.5)

## 2015-11-27 LAB — TSH: TSH: 4.33 u[IU]/mL (ref 0.35–4.50)

## 2015-11-27 LAB — COMPREHENSIVE METABOLIC PANEL
ALT: 9 U/L (ref 0–35)
AST: 14 U/L (ref 0–37)
Albumin: 3.2 g/dL — ABNORMAL LOW (ref 3.5–5.2)
Alkaline Phosphatase: 77 U/L (ref 39–117)
BILIRUBIN TOTAL: 0.2 mg/dL (ref 0.2–1.2)
BUN: 28 mg/dL — ABNORMAL HIGH (ref 6–23)
CALCIUM: 8.7 mg/dL (ref 8.4–10.5)
CHLORIDE: 106 meq/L (ref 96–112)
CO2: 29 meq/L (ref 19–32)
Creatinine, Ser: 1.41 mg/dL — ABNORMAL HIGH (ref 0.40–1.20)
GFR: 38.68 mL/min — AB (ref >60.00)
GLUCOSE: 75 mg/dL (ref 70–99)
POTASSIUM: 4.1 meq/L (ref 3.5–5.1)
Sodium: 141 mEq/L (ref 135–145)
Total Protein: 6.5 g/dL (ref 6.0–8.3)

## 2015-11-27 LAB — T4, FREE: FREE T4: 0.91 ng/dL (ref 0.60–1.60)

## 2015-11-27 LAB — IBC PANEL
Iron: 43 ug/dL (ref 42–145)
SATURATION RATIOS: 17.9 % — AB (ref 20.0–50.0)
Transferrin: 172 mg/dL — ABNORMAL LOW (ref 212.0–360.0)

## 2015-12-02 ENCOUNTER — Encounter: Payer: Self-pay | Admitting: Endocrinology

## 2015-12-02 ENCOUNTER — Ambulatory Visit (INDEPENDENT_AMBULATORY_CARE_PROVIDER_SITE_OTHER): Payer: Medicare Other | Admitting: Endocrinology

## 2015-12-02 VITALS — BP 132/70 | HR 67 | Temp 98.0°F | Resp 14 | Ht 66.0 in | Wt 141.2 lb

## 2015-12-02 DIAGNOSIS — N183 Chronic kidney disease, stage 3 unspecified: Secondary | ICD-10-CM

## 2015-12-02 DIAGNOSIS — E063 Autoimmune thyroiditis: Secondary | ICD-10-CM

## 2015-12-02 DIAGNOSIS — E119 Type 2 diabetes mellitus without complications: Secondary | ICD-10-CM

## 2015-12-02 DIAGNOSIS — E559 Vitamin D deficiency, unspecified: Secondary | ICD-10-CM

## 2015-12-02 DIAGNOSIS — Z23 Encounter for immunization: Secondary | ICD-10-CM

## 2015-12-02 DIAGNOSIS — D649 Anemia, unspecified: Secondary | ICD-10-CM | POA: Diagnosis not present

## 2015-12-02 DIAGNOSIS — E782 Mixed hyperlipidemia: Secondary | ICD-10-CM

## 2015-12-02 DIAGNOSIS — E038 Other specified hypothyroidism: Secondary | ICD-10-CM

## 2015-12-02 MED ORDER — HYDROCODONE-ACETAMINOPHEN 5-325 MG PO TABS: ORAL_TABLET | ORAL | 0 refills | Status: DC

## 2015-12-02 NOTE — Progress Notes (Signed)
Patient ID: [name] [name], female   DOB: 09/08/1940, 75 y.o.   MRN: [id number]   Reason for Appointment: Follow-up of various problems  History of Present Illness   ANEMIA:  She had severe anemia in the hospital and she apparently had some bleeding from a small intestine Doing better with stopping Plavix. On her own she is taking 81 mg aspirin at night. Currently taking iron daily, previously had constipation with this Iron saturation is improving but not yet normal   Lab Results  Component Value Date   WBC 5.7 11/27/2015   HGB 10.8 (L) 11/27/2015   HCT 32.6 (L) 11/27/2015   MCV 88.9 11/27/2015   PLT 252.0 11/27/2015    RENAL dysfunction:  Renal function is Relatively better without any specific intervention  No recurrence of hyperkalemia   Lab Results  Component Value Date   CREATININE 1.41 (H) 11/27/2015   CREATININE 1.68 (H) 11/03/2015   CREATININE 1.34 (H) 10/22/2015   CREATININE 1.31 (H) 10/21/2015   Lab Results  Component Value Date   CREATININE 1.41 (H) 11/27/2015   BUN 28 (H) 11/27/2015   NA 141 11/27/2015   K 4.1 11/27/2015   CL 106 11/27/2015   CO2 29 11/27/2015      Type 2 DIABETES mellitus, date of diagnosis: 1986.   The insulin regimen is: Levemir 18 units hs, Novolog 6-6 ac twice a day   Type 2 diabetes  has been treated in the last few years with low dose basal bolus insulin regimen. She has not been taking  any oral hypoglycemic drugs including metformin partly because of GI side effects  A1c is fairly good at 5.4  She is checking her blood sugar at breakfast time mostly and only occasionally in the afternoons and evenings Blood sugars are very consistent now in the mornings Also not high after her evening meal She is compliant with her insulin and  taking mostly 6 units with both her meals Appetite is fairly good and her weight is just a little better Also no hypoglycemia present   Side effects from medications: Diarrhea  from metformin and nausea and vomiting from GLP-1 drugs   Monitors blood glucose: Less than 1 times a day  Glucometer: One Touch.    Mean values apply above for all meters except median for One Touch  PRE-MEAL Fasting Lunch Dinner Bedtime Overall  Glucose range: 99-157    100-132    Mean/median:     112     Meals: she is usually eating low fat meals especially recently meals usually at 11 AM,and supper at 5 pm.  Calorie intake: Usually controlled. Moderate Carbs Physical activity: exercise: Unable to do any   Dietician visit: Most recent:, 5/13.   Wt Readings from Last 3 Encounters:  12/02/15 141 lb 3.2 oz (64 kg)  11/26/15 139 lb 12.8 oz (63.4 kg)  11/12/15 139 lb 3 oz (63.1 kg)    Lab Results  Component Value Date   HGBA1C 5.4 11/27/2015   HGBA1C 5.6 08/29/2015   HGBA1C 6.4 06/30/2015   Lab Results  Component Value Date   MICROALBUR 119.4 (H) 06/30/2015   LDLCALC 49 09/13/2013   CREATININE 1.41 (H) 11/27/2015     3.  HYPOTHYROIDISM: She has had long-standing primary hypothyroidism TSH has been low Normal  usually  She is taking 6-1/2 tablets per week of her 125 g dose  TSH is now slightly high, no fatigue recently   Lab Results  Component Value  Date   TSH 4.33 11/27/2015   TSH 0.496 08/29/2015   TSH 0.34 (L) 08/27/2015   FREET4 0.91 11/27/2015   FREET4 1.09 06/30/2015   FREET4 1.33 07/22/2014    OTHER active problems: See review of systems      Medication List       Accurate as of 12/02/15  1:13 PM. Always use your most recent med list.          albuterol 108 (90 Base) MCG/ACT inhaler Commonly known as:  PROVENTIL HFA;VENTOLIN HFA Inhale 2 puffs into the lungs every 6 (six) hours as needed for wheezing.   aspirin 81 MG chewable tablet Chew 81 mg by mouth at bedtime.   calcium-vitamin D 500-200 MG-UNIT tablet Commonly known as:  OSCAL WITH D Take 1 tablet by mouth every morning.   Cranberry 250 MG Tabs Take 1 tablet by mouth daily.     feeding supplement (GLUCERNA SHAKE) Liqd Take 237 mLs by mouth 3 (three) times daily between meals.   ferrous sulfate 325 (65 FE) MG tablet Take 325 mg by mouth every evening.   gabapentin 300 MG capsule Commonly known as:  NEURONTIN Take 1 capsule (300 mg total) by mouth 3 (three) times daily as needed.   HYDROcodone-acetaminophen 5-325 MG tablet Commonly known as:  NORCO/VICODIN Take 1 tablet every 6 hours as needed for back pain   insulin aspart 100 UNIT/ML FlexPen Commonly known as:  NOVOLOG Inject 8 units three times a day with meals   Insulin Detemir 100 UNIT/ML Pen Commonly known as:  LEVEMIR FLEXTOUCH Inject 22 units at bedtime.   latanoprost 0.005 % ophthalmic solution Commonly known as:  XALATAN Place 1 drop into both eyes at bedtime.   levothyroxine 125 MCG tablet Commonly known as:  SYNTHROID, LEVOTHROID Take 125 mcg by mouth daily before breakfast.   metoprolol succinate 25 MG 24 hr tablet Commonly known as:  TOPROL-XL TAKE 1/2 TABLET (=12.5MG )  EVERY MORNING   omeprazole 20 MG capsule Commonly known as:  PRILOSEC TAKE 1 CAPSULE DAILY   Pen Needles 31G X 6 MM Misc Use to inject insulin 4 times per day.   polyethylene glycol packet Commonly known as:  MIRALAX / GLYCOLAX Take 17 g by mouth daily as needed for moderate constipation.   rosuvastatin 20 MG tablet Commonly known as:  CRESTOR Take 1 tablet (20 mg total) by mouth daily.   VITAMIN B 12 PO Take 1 tablet by mouth daily.   vitamin C 500 MG tablet Commonly known as:  ASCORBIC ACID Take 500 mg by mouth every morning.   Vitamin D (Ergocalciferol) 50000 units Caps capsule Commonly known as:  DRISDOL TAKE 1 CAPSULE BY MOUTH EVERY 7 DAYS ON THURSDAYS       Allergies: No Known Allergies  Past Medical History:  Diagnosis Date  . AAA (abdominal aortic aneurysm) ([place])   . Bilateral hydronephrosis   . Carotid artery stenosis    carotid doppler 06/2012 - Right CCA/Bulb/ICA with chronic  occlusion; L vertebral artery with abnormal blood flow; L Bulb/Prox ICA  s/p endarterectomy with mild fibrous plaque, 50% diameter reduction  . CHF (congestive heart failure) ([place])   . Choledocholithiasis 2017  . CKD (chronic kidney disease), stage III   . Coronary artery disease due to lipid rich plaque cardiologist-  dr berry   s/p CABG x6 1997-- cath 12-09-2009 occluded vein to obtuse marginal branch and ramus branch with patent vien to PDA and patent LIMA to LAD, ef 40%-- Myoview 11-24-2011,  nonischemic  . Diverticulosis   . Dyspnea on exertion   . GERD (gastroesophageal reflux disease)   . GI bleed   . History of sepsis    10-18-2014 w/ acute pyelonephritis  . Hyperlipidemia   . Hypertension   . Hypothyroidism   . Ischemic cardiomyopathy    03-25-2010-- per lasts echo EF  50-55%  . PAD (peripheral artery disease) ([place])    09/2010 LEAs - R ABI of 0.45, occluded fem-pop bypass graft, L ABI of 0.59 with occluded SFA; severe arterial insuff  . PVD (peripheral vascular disease) with claudication ([place])    last duplex 07-04-2015 -- Right CCA and ICA chronic occlusion, 25-00% LICA, Patent vertebral arteries w/ antegrade flow, bilateral normal subclavian arteries   . Retroperitoneal fibrosis   . Tubular adenoma of colon   . Type 2 diabetes mellitus ([place])    monitored by dr Dwyane Dee    Past Surgical History:  Procedure Laterality Date  . AORTA - BILATERAL FEMORAL ARTERY BYPASS GRAFT  1997   and RIGHT FEM-POP   . CARDIAC CATHETERIZATION  12-09-2009  dr al little   EF >40%-- occluded vein to OM & ramus branches, patent vein to PDA, patent LIMA to LAD (Dr. [name], [facility]) - later had thrombectomy of R fem-pop bypass ad R common femoral & profunda femoris artery (Dr. Oneida Alar)  . CARDIOVASCULAR STRESS TEST  11-24-2011   dr berry   Low Risk study: fixed basal to mid inferior attenuation artifact, no reversible ischemia,  normal LV function and wall motion , ef 67%  . CAROTID ENDARTERECTOMY Bilateral  right 1994//  left ?  Marland Kitchen CATARACT EXTRACTION W/ INTRAOCULAR LENS  IMPLANT, BILATERAL  2006  . CHOLECYSTECTOMY N/A 09/05/2015   Procedure: ATTEMPTED LAPAROSCOPIC CHOLECYSTECTOMY, EXPLORATORY LAPAROTOMY WITH CHOLECYSTECTOMY;  Surgeon: Autumn Messing III, MD;  Location: [facility];  Service: General;  Laterality: N/A;  . COLONOSCOPY    . COLONOSCOPY  10/21/2015  . COLONOSCOPY WITH PROPOFOL N/A 10/21/2015   Procedure: COLONOSCOPY WITH PROPOFOL;  Surgeon: Milus Banister, MD;  Location: [facility];  Service: Endoscopy;  Laterality: N/A;  . CORONARY ARTERY BYPASS GRAFT  1997   x6; internal mammary to LAD, SVG to ramus #1 & #2, SVG to OM, SVG to PDA,   . CYSTOSCOPY W/ RETROGRADES Right 08/01/2015   Procedure: CYSTOSCOPY WITH RETROGRADE PYELOGRAM;  Surgeon: Ardis Hughs, MD;  Location: [facility];  Service: Urology;  Laterality: Right;  . CYSTOSCOPY W/ URETERAL STENT PLACEMENT  03/10/2012   Procedure: CYSTOSCOPY WITH RETROGRADE PYELOGRAM/URETERAL STENT PLACEMENT;  Surgeon: Hanley Ben, MD;  Location: [facility];  Service: Urology;  Laterality: Left;  . CYSTOSCOPY W/ URETERAL STENT PLACEMENT Bilateral 03/07/2015   Procedure: BILATERAL RETROGRADE PYELOGRAM AND RIGHT URETERAL STENT PLACEMENT;  Surgeon: Ardis Hughs, MD;  Location: [facility];  Service: Urology;  Laterality: Bilateral;  . CYSTOSCOPY W/ URETERAL STENT PLACEMENT Right 08/01/2015   Procedure: CYSTOSCOPY WITH STENT REPLACEMENT;  Surgeon: Ardis Hughs, MD;  [facility];  Service: Urology;  Laterality: Right;  . ERCP N/A 09/03/2015   Procedure: ENDOSCOPIC RETROGRADE CHOLANGIOPANCREATOGRAPHY (ERCP);  Surgeon: Irene Shipper, MD;  Location[facility]COPY;  Service: Endoscopy;  Laterality: N/A;  . ESOPHAGOGASTRODUODENOSCOPY    . ESOPHAGOGASTRODUODENOSCOPY N/A 10/19/2015   Procedure: ESOPHAGOGASTRODUODENOSCOPY (EGD);  Surgeon: Gatha Mayer, MD;  Location[facility]COPY;  Service: Endoscopy;   Laterality: N/A;  . GIVENS CAPSULE STUDY  10/21/2015  . GIVENS CAPSULE STUDY N/A 10/21/2015  Procedure: GIVENS CAPSULE STUDY;  Surgeon: Milus Banister, MD;  Location: [facility];  Service: Endoscopy;  Laterality: N/A;  . REPAIR RIGHT FEMORAL PSEUDOANEUYSM/  RIGHT FEM-POP BYPASS GRAFT/  DEBRIDEMENT RIGHT LOWER EXTREMITIY VENOUS STATUS ULCERS X2  01-12-2005  . TOTAL ABDOMINAL HYSTERECTOMY W/ BILATERAL SALPINGOOPHORECTOMY  1986  . TRANSTHORACIC ECHOCARDIOGRAM  03/25/2010   EF 86-76%, LV systolic function low normal with mild inferoseptal hypocontractility; LA mildly dilated; mod MR; mild TR, RV systolic pressure elevated, mild pulm HTN; AV mildly sclerotic; mild pulm valve regurg; aortic root sclerosis/calcif     Family History  Problem Relation Age of Onset  . Congestive Heart Failure Mother   . Diabetes Mother   . Stroke Father   . Cancer Maternal Aunt     Breast cancer    Social History:  reports that she quit smoking about 6 years ago. She has a 60.00 pack-year smoking history. She has never used smokeless tobacco. She reports that she drinks about 1.8 oz of alcohol per week . She reports that she does not use drugs.  Review of Systems -     Hyperlipidemia: Has history of high LDL and triglycerides/low HDL  Was changed from Lipitor to Crestor because of higher LDL and she was not wanting to pay the higher cost of adding Zetia   LDL is improved   Tends to have high triglycerides also despite taking fenofibrate   Lab Results  Component Value Date   CHOL 131 06/30/2015   HDL 26.90 (L) 06/30/2015   LDLCALC 49 09/13/2013   LDLDIRECT 59.0 06/30/2015   TRIG 272.0 (H) 06/30/2015   CHOLHDL 5 06/30/2015    GI evaluation: Done in the hospital for positive Hemoccult  Lab Results  Component Value Date   OCCULTBLD POSITIVE (A) 10/18/2015   OCCULTBLD NEGATIVE 08/28/2015   OCCULTBLD POSITIVE 11/28/2009    Vitamin D deficiency: She has been on supplements and taking her weekly  dose, Had a therapeutic level in 1/17   She is Has recurrent low back pain which is chronic and wants a refill of her hydrocodone For next month   Examination:   BP 132/70   Pulse 67   Temp 98 F (36.7 C)   Resp 14   Ht 5\' 6"  (1.676 m)   Wt 141 lb 3.2 oz (64 kg)   LMP  (Exact Date)   SpO2 95%   BMI 22.79 kg/m   Body mass index is 22.79 kg/m.       Assesment/PLAN:   ANEMIA: She has gradually improvement in her hemoglobin, now 10.8 Plavix has been stopped She will continue on supplements, iron saturation  is nearly normal now  RENAL dysfunction: This is improved now  DIABETES: Blood sugars have reasonably well-controlled She will continue her  current basal bolus insulin  HYPOTHYROIDISM: Her TSH is high normal and she can take to Synthroid 1 tablet every single day   Influenza vaccine given  Recommended scheduling mammogram  Total visit time for reviewing multiple problems, downloading meter, advice on medications, diabetes management and type of influenza vaccine = 25 minutes     [facility]  12/02/15

## 2015-12-02 NOTE — Patient Instructions (Addendum)
Take Vitamin C with iron  Take full Synthroid daily  Mammogram at [facility]

## 2015-12-07 ENCOUNTER — Other Ambulatory Visit: Payer: Self-pay | Admitting: Endocrinology

## 2015-12-07 DIAGNOSIS — R0600 Dyspnea, unspecified: Secondary | ICD-10-CM

## 2015-12-15 ENCOUNTER — Other Ambulatory Visit: Payer: Self-pay | Admitting: Endocrinology

## 2015-12-15 DIAGNOSIS — Z1231 Encounter for screening mammogram for malignant neoplasm of breast: Secondary | ICD-10-CM

## 2016-01-05 ENCOUNTER — Ambulatory Visit
Admission: RE | Admit: 2016-01-05 | Discharge: 2016-01-05 | Disposition: A | Discharge status: Home / Self Care | Payer: Medicare Other | Source: Ambulatory Visit | Attending: Endocrinology | Admitting: Endocrinology

## 2016-01-05 DIAGNOSIS — Z1231 Encounter for screening mammogram for malignant neoplasm of breast: Secondary | ICD-10-CM

## 2016-01-06 DIAGNOSIS — H40053 Ocular hypertension, bilateral: Secondary | ICD-10-CM | POA: Diagnosis not present

## 2016-01-06 DIAGNOSIS — H35373 Puckering of macula, bilateral: Secondary | ICD-10-CM | POA: Diagnosis not present

## 2016-01-06 DIAGNOSIS — E119 Type 2 diabetes mellitus without complications: Secondary | ICD-10-CM | POA: Diagnosis not present

## 2016-01-12 ENCOUNTER — Telehealth: Payer: Self-pay | Admitting: Endocrinology

## 2016-01-12 NOTE — Telephone Encounter (Signed)
Patient has a crick in her neck and it is very painful and ask if you would send in a muscles relaxer to her pharmacy.   [facility], [place] [phone] (Phone) [phone] (Fax)

## 2016-01-13 ENCOUNTER — Other Ambulatory Visit: Payer: Self-pay

## 2016-01-13 ENCOUNTER — Encounter: Payer: Self-pay | Admitting: Podiatry

## 2016-01-13 ENCOUNTER — Ambulatory Visit (INDEPENDENT_AMBULATORY_CARE_PROVIDER_SITE_OTHER): Payer: Medicare Other | Admitting: Podiatry

## 2016-01-13 VITALS — Ht 66.0 in | Wt 141.0 lb

## 2016-01-13 DIAGNOSIS — M79604 Pain in right leg: Secondary | ICD-10-CM

## 2016-01-13 DIAGNOSIS — M79605 Pain in left leg: Secondary | ICD-10-CM

## 2016-01-13 DIAGNOSIS — E1151 Type 2 diabetes mellitus with diabetic peripheral angiopathy without gangrene: Secondary | ICD-10-CM

## 2016-01-13 DIAGNOSIS — B351 Tinea unguium: Secondary | ICD-10-CM | POA: Diagnosis not present

## 2016-01-13 DIAGNOSIS — M79676 Pain in unspecified toe(s): Secondary | ICD-10-CM | POA: Diagnosis not present

## 2016-01-13 MED ORDER — METHOCARBAMOL 500 MG PO TABS: 500.0000 mg | ORAL_TABLET | Freq: Three times a day (TID) | ORAL | 0 refills | Status: DC

## 2016-01-13 NOTE — Telephone Encounter (Signed)
Ordered 01/13/16

## 2016-01-13 NOTE — Telephone Encounter (Signed)
Patient is calling on the status of last message  Please advise

## 2016-01-13 NOTE — Progress Notes (Signed)
Complaint:  Visit Type: Patient returns to my office for continued preventative foot care services. Complaint: Patient states" my nails have grown long and thick and become painful to walk and wear shoes" Patient has been diagnosed with DM The patient presents for preventative foot care services. No changes to ROS.  She says the callus under her right big toe joint has finally become pain-free.  Podiatric Exam: Vascular: dorsalis pedis and posterior tibial pulses are diminished   bilateral. Capillary return is immediate. Temperature gradient is WNL. Skin turgor WNL  Sensorium: Diminished Semmes Weinstein monofilament test. Normal tactile sensation bilaterally. Nail Exam: Pt has thick disfigured discolored nails with subungual debris noted bilateral entire nail hallux through fifth toenails Ulcer Exam: There is no evidence of ulcer or pre-ulcerative changes or infection. Orthopedic Exam: Muscle tone and strength are WNL. No limitations in general ROM. No crepitus or effusions noted. Foot type and digits show no abnormalities. Bony prominences are unremarkable. Skin: No Porokeratosis. No infection or ulcers  Diagnosis:  Onychomycosis, , Pain in right toe, pain in left toes  Treatment & Plan Procedures and Treatment: Consent by patient was obtained for treatment procedures. The patient understood the discussion of treatment and procedures well. All questions were answered thoroughly reviewed. Debridement of mycotic and hypertrophic toenails, 1 through 5 bilateral and clearing of subungual debris. No ulceration, no infection noted.  Return Visit-Office Procedure: Patient instructed to return to the office for a follow up visit 3 months for continued evaluation and treatment.    Gardiner Barefoot DPM

## 2016-01-13 NOTE — Telephone Encounter (Signed)
Robaxin 500 mg 3 times a day as needed

## 2016-01-22 ENCOUNTER — Ambulatory Visit
Admission: RE | Admit: 2016-01-22 | Discharge: 2016-01-22 | Disposition: A | Discharge status: Home / Self Care | Payer: Medicare Other | Source: Ambulatory Visit | Attending: Endocrinology | Admitting: Endocrinology

## 2016-01-22 ENCOUNTER — Encounter: Payer: Self-pay | Admitting: Endocrinology

## 2016-01-22 ENCOUNTER — Ambulatory Visit (INDEPENDENT_AMBULATORY_CARE_PROVIDER_SITE_OTHER): Payer: Medicare Other | Admitting: Endocrinology

## 2016-01-22 ENCOUNTER — Telehealth: Payer: Self-pay | Admitting: Endocrinology

## 2016-01-22 VITALS — BP 130/78 | HR 70 | Ht 66.0 in | Wt 146.0 lb

## 2016-01-22 DIAGNOSIS — I6523 Occlusion and stenosis of bilateral carotid arteries: Secondary | ICD-10-CM

## 2016-01-22 DIAGNOSIS — D649 Anemia, unspecified: Secondary | ICD-10-CM

## 2016-01-22 DIAGNOSIS — E119 Type 2 diabetes mellitus without complications: Secondary | ICD-10-CM | POA: Diagnosis not present

## 2016-01-22 DIAGNOSIS — R0602 Shortness of breath: Secondary | ICD-10-CM

## 2016-01-22 LAB — CBC
HCT: 33 % — ABNORMAL LOW (ref 36.0–46.0)
Hemoglobin: 10.7 g/dL — ABNORMAL LOW (ref 12.0–15.0)
MCHC: 32.5 g/dL (ref 30.0–36.0)
MCV: 86.1 fl (ref 78.0–100.0)
PLATELETS: 261 10*3/uL (ref 150.0–400.0)
RBC: 3.83 Mil/uL — ABNORMAL LOW (ref 3.87–5.11)
RDW: 15.4 % (ref 11.5–15.5)
WBC: 7.9 10*3/uL (ref 4.0–10.5)

## 2016-01-22 LAB — COMPREHENSIVE METABOLIC PANEL
ALBUMIN: 3.6 g/dL (ref 3.5–5.2)
ALK PHOS: 67 U/L (ref 39–117)
ALT: 8 U/L (ref 0–35)
AST: 14 U/L (ref 0–37)
BILIRUBIN TOTAL: 0.4 mg/dL (ref 0.2–1.2)
BUN: 25 mg/dL — ABNORMAL HIGH (ref 6–23)
CALCIUM: 9.3 mg/dL (ref 8.4–10.5)
CO2: 28 mEq/L (ref 19–32)
Chloride: 106 mEq/L (ref 96–112)
Creatinine, Ser: 1.57 mg/dL — ABNORMAL HIGH (ref 0.40–1.20)
GFR: 34.16 mL/min — AB (ref >60.00)
GLUCOSE: 149 mg/dL — AB (ref 70–99)
Potassium: 4.6 mEq/L (ref 3.5–5.1)
Sodium: 140 mEq/L (ref 135–145)
TOTAL PROTEIN: 6.8 g/dL (ref 6.0–8.3)

## 2016-01-22 MED ORDER — FUROSEMIDE 20 MG PO TABS: 20.0000 mg | ORAL_TABLET | Freq: Every day | ORAL | 0 refills | Status: DC

## 2016-01-22 NOTE — Progress Notes (Addendum)
Subjective:     Patient ID: [name] [name], female   DOB: March 31, 1940, 75 y.o.   MRN: [id number]  HPI Chief complaint: Shortness of breath  She has been having shortness of breath for the last 2 days or so. Although initially it was more related to lying down she now is having some dyspnea on walking also Her O2 saturation on her previous visit was 95 and it is lower today No complaints of chest pain or cough  Review of Systems     Objective:   Physical Exam  Neck: No JVD present.  Cardiovascular: Normal rate, regular rhythm and normal heart sounds.  Exam reveals no gallop.   No murmur heard. Pulmonary/Chest: Effort normal. No respiratory distress. She has no wheezes. She has rales.  Bibasilar rales present posteriorly especially on the left  Musculoskeletal: She exhibits no edema.  Skin: Skin is warm.    BP 130/78   Pulse 70   Ht 5\' 6"  (1.676 m)   Wt 146 lb (66.2 kg)   LMP  (Exact Date)   SpO2 (!) 89%   BMI 23.57 kg/m       Assessment:      Probable mild CHF Rule out anemia Check renal function again     Plan:     Chest x-ray, CBC and CMP Most likely may need diuretics.  If not able to find etiology on chest x-ray may need ER evaluation     [name] [name]   ADDENDUM: Chest x-ray was reviewed and appears normal.  Formal report not available However since her renal function is slightly worse would not like to do a chest CT scan today She previously has had improved dyspnea from a trial of Lasix 20 mg daily and will send this again.  Patient was informed on the phone She will call in the morning if not improved Also will need to noncontrast CT scan to be done because of her finding of small lung nodules in June  [name] [name]

## 2016-01-22 NOTE — Telephone Encounter (Signed)
Pt called and wanted to know if she would be getting anything for the shortness of breath.

## 2016-01-22 NOTE — Addendum Note (Signed)
Addended by: [name] on: 01/22/2016 05:13 PM   Modules accepted: Orders

## 2016-01-23 ENCOUNTER — Emergency Department (HOSPITAL_COMMUNITY): Payer: Medicare Other

## 2016-01-23 ENCOUNTER — Inpatient Hospital Stay (HOSPITAL_COMMUNITY)
Admission: EM | Admit: 2016-01-23 | Discharge: 2016-01-26 | DRG: 291 | Disposition: A | Discharge status: Home / Self Care | Payer: Medicare Other | Attending: Family Medicine | Admitting: Family Medicine

## 2016-01-23 ENCOUNTER — Encounter (HOSPITAL_COMMUNITY): Payer: Self-pay | Admitting: Emergency Medicine

## 2016-01-23 DIAGNOSIS — K5520 Angiodysplasia of colon without hemorrhage: Secondary | ICD-10-CM | POA: Diagnosis present

## 2016-01-23 DIAGNOSIS — I13 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 1 through stage 4 chronic kidney disease, or unspecified chronic kidney disease: Principal | ICD-10-CM | POA: Diagnosis present

## 2016-01-23 DIAGNOSIS — Z833 Family history of diabetes mellitus: Secondary | ICD-10-CM

## 2016-01-23 DIAGNOSIS — J9601 Acute respiratory failure with hypoxia: Secondary | ICD-10-CM | POA: Diagnosis present

## 2016-01-23 DIAGNOSIS — N179 Acute kidney failure, unspecified: Secondary | ICD-10-CM | POA: Diagnosis present

## 2016-01-23 DIAGNOSIS — K219 Gastro-esophageal reflux disease without esophagitis: Secondary | ICD-10-CM | POA: Diagnosis present

## 2016-01-23 DIAGNOSIS — Z7982 Long term (current) use of aspirin: Secondary | ICD-10-CM | POA: Diagnosis not present

## 2016-01-23 DIAGNOSIS — E118 Type 2 diabetes mellitus with unspecified complications: Secondary | ICD-10-CM

## 2016-01-23 DIAGNOSIS — Z87891 Personal history of nicotine dependence: Secondary | ICD-10-CM

## 2016-01-23 DIAGNOSIS — I739 Peripheral vascular disease, unspecified: Secondary | ICD-10-CM

## 2016-01-23 DIAGNOSIS — I509 Heart failure, unspecified: Secondary | ICD-10-CM | POA: Diagnosis not present

## 2016-01-23 DIAGNOSIS — J449 Chronic obstructive pulmonary disease, unspecified: Secondary | ICD-10-CM | POA: Diagnosis present

## 2016-01-23 DIAGNOSIS — E039 Hypothyroidism, unspecified: Secondary | ICD-10-CM | POA: Diagnosis present

## 2016-01-23 DIAGNOSIS — Z79899 Other long term (current) drug therapy: Secondary | ICD-10-CM | POA: Diagnosis not present

## 2016-01-23 DIAGNOSIS — R0602 Shortness of breath: Secondary | ICD-10-CM | POA: Diagnosis not present

## 2016-01-23 DIAGNOSIS — N189 Chronic kidney disease, unspecified: Secondary | ICD-10-CM

## 2016-01-23 DIAGNOSIS — E785 Hyperlipidemia, unspecified: Secondary | ICD-10-CM | POA: Diagnosis present

## 2016-01-23 DIAGNOSIS — E1151 Type 2 diabetes mellitus with diabetic peripheral angiopathy without gangrene: Secondary | ICD-10-CM | POA: Diagnosis present

## 2016-01-23 DIAGNOSIS — I484 Atypical atrial flutter: Secondary | ICD-10-CM | POA: Diagnosis present

## 2016-01-23 DIAGNOSIS — Z951 Presence of aortocoronary bypass graft: Secondary | ICD-10-CM | POA: Diagnosis present

## 2016-01-23 DIAGNOSIS — J9600 Acute respiratory failure, unspecified whether with hypoxia or hypercapnia: Secondary | ICD-10-CM | POA: Diagnosis not present

## 2016-01-23 DIAGNOSIS — Z823 Family history of stroke: Secondary | ICD-10-CM

## 2016-01-23 DIAGNOSIS — E11649 Type 2 diabetes mellitus with hypoglycemia without coma: Secondary | ICD-10-CM

## 2016-01-23 DIAGNOSIS — I272 Pulmonary hypertension, unspecified: Secondary | ICD-10-CM | POA: Diagnosis present

## 2016-01-23 DIAGNOSIS — I255 Ischemic cardiomyopathy: Secondary | ICD-10-CM | POA: Diagnosis present

## 2016-01-23 DIAGNOSIS — I480 Paroxysmal atrial fibrillation: Secondary | ICD-10-CM | POA: Diagnosis present

## 2016-01-23 DIAGNOSIS — I2583 Coronary atherosclerosis due to lipid rich plaque: Secondary | ICD-10-CM | POA: Diagnosis present

## 2016-01-23 DIAGNOSIS — E119 Type 2 diabetes mellitus without complications: Secondary | ICD-10-CM

## 2016-01-23 DIAGNOSIS — E1122 Type 2 diabetes mellitus with diabetic chronic kidney disease: Secondary | ICD-10-CM | POA: Diagnosis present

## 2016-01-23 DIAGNOSIS — I5043 Acute on chronic combined systolic (congestive) and diastolic (congestive) heart failure: Secondary | ICD-10-CM | POA: Diagnosis present

## 2016-01-23 DIAGNOSIS — I5023 Acute on chronic systolic (congestive) heart failure: Secondary | ICD-10-CM | POA: Diagnosis not present

## 2016-01-23 DIAGNOSIS — Z8249 Family history of ischemic heart disease and other diseases of the circulatory system: Secondary | ICD-10-CM

## 2016-01-23 DIAGNOSIS — I2510 Atherosclerotic heart disease of native coronary artery without angina pectoris: Secondary | ICD-10-CM | POA: Diagnosis present

## 2016-01-23 DIAGNOSIS — R0902 Hypoxemia: Secondary | ICD-10-CM

## 2016-01-23 DIAGNOSIS — I4892 Unspecified atrial flutter: Secondary | ICD-10-CM

## 2016-01-23 DIAGNOSIS — N183 Chronic kidney disease, stage 3 (moderate): Secondary | ICD-10-CM | POA: Diagnosis present

## 2016-01-23 DIAGNOSIS — I5041 Acute combined systolic (congestive) and diastolic (congestive) heart failure: Secondary | ICD-10-CM | POA: Diagnosis not present

## 2016-01-23 DIAGNOSIS — K922 Gastrointestinal hemorrhage, unspecified: Secondary | ICD-10-CM | POA: Diagnosis present

## 2016-01-23 DIAGNOSIS — I714 Abdominal aortic aneurysm, without rupture, unspecified: Secondary | ICD-10-CM | POA: Diagnosis present

## 2016-01-23 DIAGNOSIS — Z803 Family history of malignant neoplasm of breast: Secondary | ICD-10-CM

## 2016-01-23 DIAGNOSIS — Z794 Long term (current) use of insulin: Secondary | ICD-10-CM | POA: Diagnosis not present

## 2016-01-23 DIAGNOSIS — I5031 Acute diastolic (congestive) heart failure: Secondary | ICD-10-CM | POA: Diagnosis not present

## 2016-01-23 DIAGNOSIS — R0609 Other forms of dyspnea: Secondary | ICD-10-CM

## 2016-01-23 DIAGNOSIS — I10 Essential (primary) hypertension: Secondary | ICD-10-CM | POA: Diagnosis present

## 2016-01-23 LAB — COMPREHENSIVE METABOLIC PANEL
ALBUMIN: 3.5 g/dL (ref 3.5–5.0)
ALT: 10 U/L — AB (ref 14–54)
AST: 17 U/L (ref 15–41)
Alkaline Phosphatase: 69 U/L (ref 38–126)
Anion gap: 9 (ref 5–15)
BILIRUBIN TOTAL: 0.7 mg/dL (ref 0.3–1.2)
BUN: 27 mg/dL — AB (ref 6–20)
CHLORIDE: 107 mmol/L (ref 101–111)
CO2: 24 mmol/L (ref 22–32)
CREATININE: 1.56 mg/dL — AB (ref 0.44–1.00)
Calcium: 9.1 mg/dL (ref 8.9–10.3)
GFR calc Af Amer: 37 mL/min — ABNORMAL LOW (ref >60)
GFR, EST NON AFRICAN AMERICAN: 32 mL/min — AB (ref >60)
GLUCOSE: 141 mg/dL — AB (ref 65–99)
POTASSIUM: 4.7 mmol/L (ref 3.5–5.1)
Sodium: 140 mmol/L (ref 135–145)
TOTAL PROTEIN: 7 g/dL (ref 6.5–8.1)

## 2016-01-23 LAB — CBC WITH DIFFERENTIAL/PLATELET
BASOS ABS: 0 10*3/uL (ref 0.0–0.1)
Basophils Relative: 0 %
EOS ABS: 0 10*3/uL (ref 0.0–0.7)
EOS PCT: 1 %
HCT: 34.7 % — ABNORMAL LOW (ref 36.0–46.0)
HEMOGLOBIN: 10.8 g/dL — AB (ref 12.0–15.0)
LYMPHS PCT: 12 %
Lymphs Abs: 0.8 10*3/uL (ref 0.7–4.0)
MCH: 28.2 pg (ref 26.0–34.0)
MCHC: 31.1 g/dL (ref 30.0–36.0)
MCV: 90.6 fL (ref 78.0–100.0)
Monocytes Absolute: 0.3 10*3/uL (ref 0.1–1.0)
Monocytes Relative: 5 %
NEUTROS PCT: 82 %
Neutro Abs: 5.6 10*3/uL (ref 1.7–7.7)
PLATELETS: 249 10*3/uL (ref 150–400)
RBC: 3.83 MIL/uL — AB (ref 3.87–5.11)
RDW: 14.8 % (ref 11.5–15.5)
WBC: 6.8 10*3/uL (ref 4.0–10.5)

## 2016-01-23 LAB — GLUCOSE, CAPILLARY
GLUCOSE-CAPILLARY: 121 mg/dL — AB (ref 65–99)
Glucose-Capillary: 130 mg/dL — ABNORMAL HIGH (ref 65–99)

## 2016-01-23 LAB — D-DIMER, QUANTITATIVE (NOT AT ARMC): D DIMER QUANT: 1.63 ug{FEU}/mL — AB (ref 0.00–0.50)

## 2016-01-23 LAB — TROPONIN I
Troponin I: 0.03 ng/mL (ref <0.03)
Troponin I: 0.04 ng/mL (ref <0.03)

## 2016-01-23 LAB — BRAIN NATRIURETIC PEPTIDE: B Natriuretic Peptide: 1040.5 pg/mL — ABNORMAL HIGH (ref 0.0–100.0)

## 2016-01-23 MED ORDER — ASPIRIN 81 MG PO CHEW
81.0000 mg | CHEWABLE_TABLET | Freq: Every day | ORAL | Status: DC
  Administered 2016-01-24 – 2016-01-26 (×3): 81 mg via ORAL
  Filled 2016-01-23 (×3): qty 1

## 2016-01-23 MED ORDER — SODIUM CHLORIDE 0.9 % IV SOLN: 250.0000 mL | INTRAVENOUS | Status: DC | PRN

## 2016-01-23 MED ORDER — INSULIN DETEMIR 100 UNIT/ML ~~LOC~~ SOLN
18.0000 [IU] | Freq: Every day | SUBCUTANEOUS | Status: DC
  Administered 2016-01-23 – 2016-01-25 (×3): 18 [IU] via SUBCUTANEOUS
  Filled 2016-01-23 (×4): qty 0.18

## 2016-01-23 MED ORDER — INSULIN DETEMIR 100 UNIT/ML FLEXPEN: 18.0000 [IU] | PEN_INJECTOR | Freq: Every day | SUBCUTANEOUS | Status: DC

## 2016-01-23 MED ORDER — FUROSEMIDE 10 MG/ML IJ SOLN
40.0000 mg | Freq: Once | INTRAMUSCULAR | Status: AC
  Administered 2016-01-23: 40 mg via INTRAVENOUS
  Filled 2016-01-23: qty 4

## 2016-01-23 MED ORDER — LEVOTHYROXINE SODIUM 25 MCG PO TABS
125.0000 ug | ORAL_TABLET | Freq: Every day | ORAL | Status: DC
  Administered 2016-01-24 – 2016-01-26 (×3): 125 ug via ORAL
  Filled 2016-01-23 (×3): qty 1

## 2016-01-23 MED ORDER — METHOCARBAMOL 500 MG PO TABS
500.0000 mg | ORAL_TABLET | Freq: Three times a day (TID) | ORAL | Status: DC
  Administered 2016-01-23 – 2016-01-26 (×8): 500 mg via ORAL
  Filled 2016-01-23 (×8): qty 1

## 2016-01-23 MED ORDER — SODIUM CHLORIDE 0.9% FLUSH
3.0000 mL | Freq: Two times a day (BID) | INTRAVENOUS | Status: DC
  Administered 2016-01-23 – 2016-01-26 (×6): 3 mL via INTRAVENOUS

## 2016-01-23 MED ORDER — PANTOPRAZOLE SODIUM 40 MG PO TBEC
40.0000 mg | DELAYED_RELEASE_TABLET | Freq: Every day | ORAL | Status: DC
  Administered 2016-01-24 – 2016-01-26 (×3): 40 mg via ORAL
  Filled 2016-01-23 (×3): qty 1

## 2016-01-23 MED ORDER — FUROSEMIDE 10 MG/ML IJ SOLN
40.0000 mg | Freq: Every day | INTRAMUSCULAR | Status: DC
  Administered 2016-01-24: 40 mg via INTRAVENOUS
  Filled 2016-01-23: qty 4

## 2016-01-23 MED ORDER — CALCIUM CARBONATE-VITAMIN D 500-200 MG-UNIT PO TABS
1.0000 | ORAL_TABLET | Freq: Every morning | ORAL | Status: DC
  Administered 2016-01-24 – 2016-01-26 (×3): 1 via ORAL
  Filled 2016-01-23 (×3): qty 1

## 2016-01-23 MED ORDER — HYDROCODONE-ACETAMINOPHEN 5-325 MG PO TABS
1.0000 | ORAL_TABLET | Freq: Four times a day (QID) | ORAL | Status: DC | PRN
  Administered 2016-01-25 – 2016-01-26 (×2): 1 via ORAL
  Filled 2016-01-23 (×2): qty 1

## 2016-01-23 MED ORDER — ALBUTEROL SULFATE HFA 108 (90 BASE) MCG/ACT IN AERS: 1.0000 | INHALATION_SPRAY | Freq: Four times a day (QID) | RESPIRATORY_TRACT | Status: DC | PRN

## 2016-01-23 MED ORDER — LATANOPROST 0.005 % OP SOLN
1.0000 [drp] | Freq: Every day | OPHTHALMIC | Status: DC
  Administered 2016-01-23 – 2016-01-25 (×3): 1 [drp] via OPHTHALMIC
  Filled 2016-01-23: qty 2.5

## 2016-01-23 MED ORDER — ACETAMINOPHEN 325 MG PO TABS
650.0000 mg | ORAL_TABLET | ORAL | Status: DC | PRN
  Administered 2016-01-23 – 2016-01-26 (×3): 650 mg via ORAL
  Filled 2016-01-23 (×3): qty 2

## 2016-01-23 MED ORDER — ROSUVASTATIN CALCIUM 20 MG PO TABS
20.0000 mg | ORAL_TABLET | Freq: Every day | ORAL | Status: DC
  Administered 2016-01-24 – 2016-01-25 (×2): 20 mg via ORAL
  Filled 2016-01-23 (×2): qty 1

## 2016-01-23 MED ORDER — ONDANSETRON HCL 4 MG/2ML IJ SOLN: 4.0000 mg | Freq: Four times a day (QID) | INTRAMUSCULAR | Status: DC | PRN

## 2016-01-23 MED ORDER — FERROUS SULFATE 325 (65 FE) MG PO TABS
325.0000 mg | ORAL_TABLET | Freq: Every evening | ORAL | Status: DC
  Administered 2016-01-23 – 2016-01-25 (×3): 325 mg via ORAL
  Filled 2016-01-23 (×3): qty 1

## 2016-01-23 MED ORDER — GABAPENTIN 300 MG PO CAPS: 300.0000 mg | ORAL_CAPSULE | Freq: Three times a day (TID) | ORAL | Status: DC | PRN

## 2016-01-23 MED ORDER — POLYETHYLENE GLYCOL 3350 17 G PO PACK: 17.0000 g | PACK | Freq: Every day | ORAL | Status: DC | PRN

## 2016-01-23 MED ORDER — ENOXAPARIN SODIUM 30 MG/0.3ML ~~LOC~~ SOLN
30.0000 mg | SUBCUTANEOUS | Status: DC
  Administered 2016-01-23 – 2016-01-25 (×3): 30 mg via SUBCUTANEOUS
  Filled 2016-01-23 (×3): qty 0.3

## 2016-01-23 MED ORDER — VITAMIN C 500 MG PO TABS
500.0000 mg | ORAL_TABLET | Freq: Every day | ORAL | Status: DC
  Administered 2016-01-23 – 2016-01-25 (×3): 500 mg via ORAL
  Filled 2016-01-23 (×3): qty 1

## 2016-01-23 MED ORDER — SODIUM CHLORIDE 0.9% FLUSH: 3.0000 mL | INTRAVENOUS | Status: DC | PRN

## 2016-01-23 MED ORDER — ALBUTEROL SULFATE (2.5 MG/3ML) 0.083% IN NEBU: 2.5000 mg | INHALATION_SOLUTION | Freq: Four times a day (QID) | RESPIRATORY_TRACT | Status: DC | PRN

## 2016-01-23 MED ORDER — INSULIN ASPART 100 UNIT/ML ~~LOC~~ SOLN
0.0000 [IU] | Freq: Three times a day (TID) | SUBCUTANEOUS | Status: DC
  Administered 2016-01-24 – 2016-01-26 (×3): 1 [IU] via SUBCUTANEOUS

## 2016-01-23 MED ORDER — METOPROLOL SUCCINATE ER 25 MG PO TB24
25.0000 mg | ORAL_TABLET | Freq: Every day | ORAL | Status: DC
  Administered 2016-01-24 – 2016-01-26 (×3): 25 mg via ORAL
  Filled 2016-01-23 (×3): qty 1

## 2016-01-23 MED ORDER — INSULIN ASPART 100 UNIT/ML ~~LOC~~ SOLN: 0.0000 [IU] | Freq: Every day | SUBCUTANEOUS | Status: DC

## 2016-01-23 NOTE — ED Triage Notes (Signed)
Pt complaint of SOB for a few days; pt sent by PCP for low oxygen levels. Hx of CHF.

## 2016-01-23 NOTE — Progress Notes (Signed)
Please let patient know that the x-ray result is normal and if she is still having trouble breathing will need to send her for CT scan

## 2016-01-23 NOTE — Progress Notes (Signed)
MD notified of critical lab and orders obtained. Eulas Post, RN

## 2016-01-23 NOTE — ED Notes (Signed)
Patient transported to X-ray 

## 2016-01-23 NOTE — ED Provider Notes (Signed)
[facility] DEPT Provider Note   CSN: [id number] Arrival date & time: 01/23/16  1316     History   Chief Complaint Chief Complaint  Patient presents with  . Shortness of Breath    HPI [name] [name] is a 75 y.o. female.  HPI  Patient presents with dyspnea on exertion, and orthopnea. Symptoms began about one week ago. Patient acknowledges history CHF, states that she has not taken Lasix in at least the last few months, but after restarting his medication over the past 2 days she has had persistent worsening symptoms. No focal pain.  There is generalized weakness.  Patient has spoken with her physician, and after symptoms persisted, she was sent here for evaluation. No fever, chills, confusion, this rotation, abdominal discomfort.  Past Medical History:  Diagnosis Date  . AAA (abdominal aortic aneurysm) ([place])   . Bilateral hydronephrosis   . Carotid artery stenosis    carotid doppler 06/2012 - Right CCA/Bulb/ICA with chronic occlusion; L vertebral artery with abnormal blood flow; L Bulb/Prox ICA  s/p endarterectomy with mild fibrous plaque, 50% diameter reduction  . CHF (congestive heart failure) ([place])   . Choledocholithiasis 2017  . CKD (chronic kidney disease), stage III   . Coronary artery disease due to lipid rich plaque cardiologist-  dr berry   s/p CABG x6 1997-- cath 12-09-2009 occluded vein to obtuse marginal branch and ramus branch with patent vien to PDA and patent LIMA to LAD, ef 40%-- Myoview 11-24-2011, nonischemic  . Diverticulosis   . Dyspnea on exertion   . GERD (gastroesophageal reflux disease)   . GI bleed   . History of sepsis    10-18-2014 w/ acute pyelonephritis  . Hyperlipidemia   . Hypertension   . Hypothyroidism   . Ischemic cardiomyopathy    03-25-2010-- per lasts echo EF  50-55%  . PAD (peripheral artery disease) ([place])    09/2010 LEAs - R ABI of 0.45, occluded fem-pop bypass graft, L ABI of 0.59 with occluded SFA; severe arterial insuff  .  PVD (peripheral vascular disease) with claudication ([place])    last duplex 07-04-2015 -- Right CCA and ICA chronic occlusion, 97-67% LICA, Patent vertebral arteries w/ antegrade flow, bilateral normal subclavian arteries   . Retroperitoneal fibrosis   . Tubular adenoma of colon   . Type 2 diabetes mellitus ([place])    monitored by dr Dwyane Dee    Patient Active Problem List   Diagnosis Date Noted  . Melena   . Acute blood loss anemia   . GI bleed 10/18/2015  . Preop cardiovascular exam 09/04/2015  . Pain   . Weakness   . Abnormal liver function   . Choledocholithiasis   . Nausea without vomiting   . Pressure ulcer 08/29/2015  . Protein-calorie malnutrition, severe 08/29/2015  . Hypotension 08/28/2015  . Lung nodule   . Sepsis ([place]) 10/17/2014  . Hyperkalemia 10/17/2014  . Acute pyelonephritis 10/16/2014  . Hyperlipidemia 01/08/2014  . Type II or unspecified type diabetes mellitus with renal manifestations, uncontrolled(250.42) 01/10/2013  . Carotid artery disease ([place]) 01/03/2013  . DOE (dyspnea on exertion) 11/20/2012  . Claudication in peripheral vascular disease ([place]) 11/20/2012  . Bradycardia 11/20/2012  . Chronic kidney disease, stage III (moderate) 11/08/2012  . Other and unspecified hyperlipidemia 10/01/2012  . Hypothyroidism 10/01/2012  . Chronic combined systolic and diastolic congestive heart failure ([place]) 06/08/2012  . CHF (congestive heart failure) ([place]) 05/07/2012  . Abdominal pain 03/09/2012  . Altered mental status 03/09/2012  . Diabetes mellitus,  type II ([place]) 03/09/2012  . Arthritis 03/09/2012  . HTN (hypertension) 03/09/2012  . Acute encephalopathy 03/09/2012  . Normocytic anemia 03/09/2012  . Elevated LFTs 03/09/2012  . AAA (abdominal aortic aneurysm) ([place]) 03/09/2012  . UTI (lower urinary tract infection) 03/08/2012  . CAD (coronary artery disease) 03/08/2012    Past Surgical History:  Procedure Laterality Date  . AORTA - BILATERAL FEMORAL ARTERY BYPASS  GRAFT  1997   and RIGHT FEM-POP   . CARDIAC CATHETERIZATION  12-09-2009  dr al little   EF >40%-- occluded vein to OM & ramus branches, patent vein to PDA, patent LIMA to LAD (Dr. [name], [facility]) - later had thrombectomy of R fem-pop bypass ad R common femoral & profunda femoris artery (Dr. Oneida Alar)  . CARDIOVASCULAR STRESS TEST  11-24-2011   dr berry   Low Risk study: fixed basal to mid inferior attenuation artifact, no reversible ischemia,  normal LV function and wall motion , ef 67%  . CAROTID ENDARTERECTOMY Bilateral right 1994//  left ?  Marland Kitchen CATARACT EXTRACTION W/ INTRAOCULAR LENS  IMPLANT, BILATERAL  2006  . CHOLECYSTECTOMY N/A 09/05/2015   Procedure: ATTEMPTED LAPAROSCOPIC CHOLECYSTECTOMY, EXPLORATORY LAPAROTOMY WITH CHOLECYSTECTOMY;  Surgeon: Autumn Messing III, MD;  Location: [facility];  Service: General;  Laterality: N/A;  . COLONOSCOPY    . COLONOSCOPY  10/21/2015  . COLONOSCOPY WITH PROPOFOL N/A 10/21/2015   Procedure: COLONOSCOPY WITH PROPOFOL;  Surgeon: Milus Banister, MD;  Location: [place];  Service: Endoscopy;  Laterality: N/A;  . CORONARY ARTERY BYPASS GRAFT  1997   x6; internal mammary to LAD, SVG to ramus #1 & #2, SVG to OM, SVG to PDA,   . CYSTOSCOPY W/ RETROGRADES Right 08/01/2015   Procedure: CYSTOSCOPY WITH RETROGRADE PYELOGRAM;  Surgeon: Ardis Hughs, MD;  Location: [facility];  Service: Urology;  Laterality: Right;  . CYSTOSCOPY W/ URETERAL STENT PLACEMENT  03/10/2012   Procedure: CYSTOSCOPY WITH RETROGRADE PYELOGRAM/URETERAL STENT PLACEMENT;  Surgeon: Hanley Ben, MD;  Location: [facility];  Service: Urology;  Laterality: Left;  . CYSTOSCOPY W/ URETERAL STENT PLACEMENT Bilateral 03/07/2015   Procedure: BILATERAL RETROGRADE PYELOGRAM AND RIGHT URETERAL STENT PLACEMENT;  Surgeon: Ardis Hughs, MD;  Location: [facility];  Service: Urology;  Laterality: Bilateral;  . CYSTOSCOPY W/ URETERAL STENT PLACEMENT Right 08/01/2015   Procedure:  CYSTOSCOPY WITH STENT REPLACEMENT;  Surgeon: Ardis Hughs, MD;  Location: [facility];  Service: Urology;  Laterality: Right;  . ERCP N/A 09/03/2015   Procedure: ENDOSCOPIC RETROGRADE CHOLANGIOPANCREATOGRAPHY (ERCP);  Surgeon: Irene Shipper, MD;  Location: [facility];  Service: Endoscopy;  Laterality: N/A;  . ESOPHAGOGASTRODUODENOSCOPY    . ESOPHAGOGASTRODUODENOSCOPY N/A 10/19/2015   Procedure: ESOPHAGOGASTRODUODENOSCOPY (EGD);  Surgeon: Gatha Mayer, MD;  Location: [facility];  Service: Endoscopy;  Laterality: N/A;  . GIVENS CAPSULE STUDY  10/21/2015  . GIVENS CAPSULE STUDY N/A 10/21/2015   Procedure: GIVENS CAPSULE STUDY;  Surgeon: Milus Banister, MD;  Location: [facility];  Service: Endoscopy;  Laterality: N/A;  . REPAIR RIGHT FEMORAL PSEUDOANEUYSM/  RIGHT FEM-POP BYPASS GRAFT/  DEBRIDEMENT RIGHT LOWER EXTREMITIY VENOUS STATUS ULCERS X2  01-12-2005  . TOTAL ABDOMINAL HYSTERECTOMY W/ BILATERAL SALPINGOOPHORECTOMY  1986  . TRANSTHORACIC ECHOCARDIOGRAM  03/25/2010   EF 62-69%, LV systolic function low normal with mild inferoseptal hypocontractility; LA mildly dilated; mod MR; mild TR, RV systolic pressure elevated, mild pulm HTN; AV mildly sclerotic; mild pulm valve regurg; aortic root sclerosis/calcif     OB History  No data available       Home Medications    Prior to Admission medications   Medication Sig Start Date End Date Taking? Authorizing Provider  aspirin 81 MG chewable tablet Chew 81 mg by mouth daily.    Yes Historical Provider, MD  calcium-vitamin D (OSCAL WITH D) 500-200 MG-UNIT per tablet Take 1 tablet by mouth every morning.   Yes Historical Provider, MD  Cyanocobalamin (VITAMIN B 12 PO) Take 1 tablet by mouth daily.    Yes Historical Provider, MD  ferrous sulfate 325 (65 FE) MG tablet Take 325 mg by mouth every evening.    Yes Historical Provider, MD  furosemide (LASIX) 20 MG tablet Take 1 tablet (20 mg total) by mouth daily. 01/22/16  Yes [name], MD  gabapentin (NEURONTIN) 300 MG capsule Take 1 capsule (300 mg total) by mouth 3 (three) times daily as needed. 09/30/15  Yes [name], MD  HYDROcodone-acetaminophen (NORCO/VICODIN) 5-325 MG tablet Take 1 tablet every 6 hours as needed for back pain 12/02/15  Yes [name], MD  insulin aspart (NOVOLOG) 100 UNIT/ML FlexPen Inject 8 units three times a day with meals Patient taking differently: 6 Units. Inject 8 units three times a day with meals 09/30/14  Yes [name], MD  Insulin Detemir (LEVEMIR FLEXTOUCH) 100 UNIT/ML Pen Inject 22 units at bedtime. Patient taking differently: Inject 18 Units into the skin at bedtime.  01/13/15  Yes [name], MD  latanoprost (XALATAN) 0.005 % ophthalmic solution Place 1 drop into both eyes at bedtime. 03/11/14  Yes Historical Provider, MD  levothyroxine (SYNTHROID, LEVOTHROID) 125 MCG tablet Take 125 mcg by mouth daily before breakfast.   Yes Historical Provider, MD  methocarbamol (ROBAXIN) 500 MG tablet Take 1 tablet (500 mg total) by mouth 3 (three) times daily. As needed 01/13/16  Yes [name], MD  metoprolol succinate (TOPROL-XL) 25 MG 24 hr tablet TAKE 1/2 TABLET (=12.5MG )  EVERY MORNING 06/24/15  Yes [name], MD  omeprazole (PRILOSEC) 20 MG capsule TAKE 1 CAPSULE DAILY Patient taking differently: TAKE 1 CAPSULE DAILY--  takes in am 02/12/15  Yes [name], MD  polyethylene glycol (MIRALAX / GLYCOLAX) packet Take 17 g by mouth daily as needed for moderate constipation. 09/08/15  Yes Maryann Mikhail, DO  rosuvastatin (CRESTOR) 20 MG tablet Take 1 tablet (20 mg total) by mouth daily. 09/22/15  Yes [name], MD  VENTOLIN HFA 108 (90 Base) MCG/ACT inhaler inhale 2 puffs by mouth INTO THE LUNGS every 6 hours if needed for wheezing 12/08/15  Yes [name], MD  vitamin C (ASCORBIC ACID) 500 MG tablet Take 500 mg by mouth at bedtime.    Yes Historical Provider, MD  Vitamin D, Ergocalciferol, (DRISDOL) 50000 units CAPS capsule TAKE 1 CAPSULE BY MOUTH EVERY  7 DAYS ON THURSDAYS 08/19/15  Yes [name], MD  Cranberry 250 MG TABS Take 1 tablet by mouth daily.     Historical Provider, MD  feeding supplement, GLUCERNA SHAKE, (GLUCERNA SHAKE) LIQD Take 237 mLs by mouth 3 (three) times daily between meals. Patient not taking: Reported on 01/23/2016 09/08/15   Velta Addison Mikhail, DO  Insulin Pen Needle (PEN NEEDLES) 31G X 6 MM MISC Use to inject insulin 4 times per day. 10/06/15   [name], MD    Family History Family History  Problem Relation Age of Onset  . Congestive Heart Failure Mother   . Diabetes Mother   . Stroke Father   . Cancer Maternal Aunt  Breast cancer    Social History Social History  Substance Use Topics  . Smoking status: Former Smoker    Packs/day: 2.00    Years: 30.00    Quit date: 06/07/2009  . Smokeless tobacco: Never Used  . Alcohol use 1.8 oz/week    3 Standard drinks or equivalent per week     Comment: ocassional     Allergies   Patient has no known allergies.   Review of Systems Review of Systems  Constitutional:       Per HPI, otherwise negative  HENT:       Per HPI, otherwise negative  Respiratory:       Per HPI, otherwise negative  Cardiovascular:       Per HPI, otherwise negative  Gastrointestinal: Negative for vomiting.  Endocrine:       Negative aside from HPI  Genitourinary:       Neg aside from HPI   Musculoskeletal:       Per HPI, otherwise negative  Skin: Negative.   Neurological: Negative for syncope.     Physical Exam Updated Vital Signs BP 158/68 (BP Location: Right Arm)   Pulse 66   Temp 97.8 F (36.6 C) (Oral)   Resp 25   Wt 146 lb (66.2 kg)   LMP  (Exact Date)   SpO2 93%   BMI 23.57 kg/m   Physical Exam  Constitutional: She is oriented to person, place, and time. She appears well-developed and well-nourished. No distress.  HENT:  Head: Normocephalic and atraumatic.  Eyes: Conjunctivae and EOM are normal.  Cardiovascular: Normal rate and regular rhythm.     Pulmonary/Chest: Effort normal. No stridor. No respiratory distress. She has decreased breath sounds.  Abdominal: She exhibits no distension.  Musculoskeletal: She exhibits no edema.  Neurological: She is alert and oriented to person, place, and time. No cranial nerve deficit.  Skin: Skin is warm and dry.  Psychiatric: She has a normal mood and affect.  Nursing note and vitals reviewed.    ED Treatments / Results  Labs (all labs ordered are listed, but only abnormal results are displayed) Labs Reviewed  COMPREHENSIVE METABOLIC PANEL - Abnormal; Notable for the following:       Result Value   Glucose, Bld 141 (*)    BUN 27 (*)    Creatinine, Ser 1.56 (*)    ALT 10 (*)    GFR calc non Af Amer 32 (*)    GFR calc Af Amer 37 (*)    All other components within normal limits  CBC WITH DIFFERENTIAL/PLATELET - Abnormal; Notable for the following:    RBC 3.83 (*)    Hemoglobin 10.8 (*)    HCT 34.7 (*)    All other components within normal limits  D-DIMER, QUANTITATIVE (NOT AT [facility]) - Abnormal; Notable for the following:    D-Dimer, Quant 1.63 (*)    All other components within normal limits  GLUCOSE, CAPILLARY - Abnormal; Notable for the following:    Glucose-Capillary 121 (*)    All other components within normal limits  TROPONIN I  BRAIN NATRIURETIC PEPTIDE  TROPONIN I  TROPONIN I  TROPONIN I  BASIC METABOLIC PANEL  HEMOGLOBIN A1C    Initial labs notable for elevated creatinine, patient not a candidate for scan with contrast.   EKG  EKG Interpretation  Date/Time:  Friday January 23 2016 13:28:34 EST Ventricular Rate:  113 PR Interval:    QRS Duration: 91 QT Interval:  335 QTC Calculation: 460  R Axis:   99 Text Interpretation:  Atrial fibrillation ST-t wave abnormality Abnormal ekg Confirmed by Carmin Muskrat  MD [phone]) on 01/23/2016 1:59:24 PM       Radiology Dg Chest 2 View  Result Date: 01/23/2016 CLINICAL DATA:  Shortness of breath. EXAM: CHEST  2 VIEW  COMPARISON:  Radiographs of January 22, 2016. FINDINGS: Stable cardiomediastinal silhouette. No pneumothorax or pleural effusion is noted. Status post coronary artery bypass graft. Stable scarring and pleural thickening is noted in left lung base. Right lung is clear. No acute abnormality seen. Bony thorax is unremarkable. IMPRESSION: Stable left basilar scarring and pleural thickening. No acute cardiopulmonary abnormality seen. Electronically Signed   By: Marijo Conception, M.D.   On: 01/23/2016 14:17   Dg Chest 2 View  Result Date: 01/23/2016 CLINICAL DATA:  Increasing shortness of breath for the past week. History of CHF, hypertension, ischemic cardiomyopathy, and coronary artery disease status post CABG. EXAM: CHEST  2 VIEW COMPARISON:  Portable chest x-ray of October 18, 2015 FINDINGS: The lungs are hyperinflated. There is chronic blunting of the costophrenic angles. Increased density along the pleural surface at the left lung base is consistent with previous talc pleurodesis. There is no alveolar infiltrate. The heart is normal in size. The pulmonary vascularity is not engorged. There are post CABG changes. There is calcification in the wall of the aortic arch. The bony thorax exhibits no acute abnormality. IMPRESSION: COPD. Chronic pleuro parenchymal changes especially at the left lung base. No CHF or alveolar pneumonia. Previous CABG. If the patient has dyspnea persists, chest CT scanning would be a useful next imaging step. Thoracic aortic atherosclerosis. Electronically Signed   By: David  Martinique M.D.   On: 01/23/2016 07:31    Procedures Procedures (including critical care time)  Medications Ordered in ED Medications  methocarbamol (ROBAXIN) tablet 500 mg (not administered)  HYDROcodone-acetaminophen (NORCO/VICODIN) 5-325 MG per tablet 1 tablet (not administered)  levothyroxine (SYNTHROID, LEVOTHROID) tablet 125 mcg (not administered)  gabapentin (NEURONTIN) capsule 300 mg (not administered)    rosuvastatin (CRESTOR) tablet 20 mg (not administered)  polyethylene glycol (MIRALAX / GLYCOLAX) packet 17 g (not administered)  pantoprazole (PROTONIX) EC tablet 40 mg (not administered)  latanoprost (XALATAN) 0.005 % ophthalmic solution 1 drop (not administered)  aspirin chewable tablet 81 mg (not administered)  calcium-vitamin D (OSCAL WITH D) 500-200 MG-UNIT per tablet 1 tablet (not administered)  ferrous sulfate tablet 325 mg (not administered)  vitamin C (ASCORBIC ACID) tablet 500 mg (not administered)  sodium chloride flush (NS) 0.9 % injection 3 mL (not administered)  sodium chloride flush (NS) 0.9 % injection 3 mL (not administered)  0.9 %  sodium chloride infusion (not administered)  furosemide (LASIX) injection 40 mg (not administered)  acetaminophen (TYLENOL) tablet 650 mg (not administered)  ondansetron (ZOFRAN) injection 4 mg (not administered)  enoxaparin (LOVENOX) injection 30 mg (not administered)  metoprolol succinate (TOPROL-XL) 24 hr tablet 25 mg (not administered)  albuterol (PROVENTIL) (2.5 MG/3ML) 0.083% nebulizer solution 2.5 mg (not administered)  insulin detemir (LEVEMIR) injection 18 Units (not administered)  insulin aspart (novoLOG) injection 0-5 Units (not administered)  insulin aspart (novoLOG) injection 0-9 Units (not administered)  furosemide (LASIX) injection 40 mg (40 mg Intravenous Given 01/23/16 1600)   Pulse ox symmetry 85% on room air abnormal This corrects to an appropriate level with supplemental oxygen.   Initial Impression / Assessment and Plan / ED Course  I have reviewed the triage vital signs and the nursing notes.  Pertinent  labs & imaging results that were available during my care of the patient were reviewed by me and considered in my medical decision making (see chart for details).  Clinical Course     Elderly female with multiple medical issues including CHF presents with worsening dyspnea on exertion, orthopnea. Here the patient,  at rest, is a symptomatically, but with motion, patient become symptomatic quickly. Evaluation here concerning for worsening congestive heart failure versus other new pathology as the patient has new oxygen requirement.   Patient is hemodynamically stable, but given concern for this new oxygen requirement the patient was admitted for further evaluation and management.   Carmin Muskrat, MD 01/23/16 [phone]

## 2016-01-23 NOTE — H&P (Signed)
History and Physical        Hospital Admission Note Date: 01/23/2016  Patient name: [name] [name] Medical record number: [id number] Date of birth: [birth year] Age: 75 y.o. Gender: female  PCP: [name], MD  Primary cardiologist, Dr. Donnella Bi Referring physician: Dr Vanita Panda  Patient coming from: home   Chief Complaint:  Shortness of breath for last few days  HPI: Patient is a 75 year old female with history of hypertension, hyperlipidemia, hypothyroidism, diabetes mellitus, CAD, status post CABG CHF, EF 33%, grade 2 diastolic dysfunction presented to ED with shortness of breath. Patient brought with her she's been having shortness of breath for last 4-5 days. She's been having trouble sleeping flat in the bed, and has been having dyspnea with exertion from going from her bedroom to the bathroom and returning back. She was seen by her PCP on 11/16 yesterday and was noticed to have hypoxia with rails on examination. Patient denied any chest pain, any productive cough fevers or chills. Denied any weight gain or lower leg edema. Chest x-ray was ordered by her PCP and appeared to be normal. Patient was given Lasix of 20 mg and she felt somewhat better with the Lasix. In ED, patient was still noticed to be somewhat hypoxic with O2 sats of 89% on room air. ED work-up/course:  Sodium 140, potassium 4.7, BUN 27, creatinine 1.5, LFTs normal, WBC 6.8, troponin less than 0.03  Review of Systems: Positives marked in 'bold' Constitutional: Denies fever, chills, diaphoresis, poor appetite and fatigue.  HEENT: Denies photophobia, eye pain, redness, hearing loss, ear pain, congestion, sore throat, rhinorrhea, sneezing, mouth sores, trouble swallowing, neck pain, neck stiffness and tinnitus.   Respiratory: Please see history of present illness   Cardiovascular: Denies chest pain,  palpitations and leg swelling.  Gastrointestinal: Denies nausea, vomiting, abdominal pain, diarrhea, constipation, blood in stool and abdominal distention.  Genitourinary: Denies dysuria, urgency, frequency, hematuria, flank pain and difficulty urinating.  Musculoskeletal: Denies myalgias, back pain, joint swelling, arthralgias and gait problem.  Skin: Denies pallor, rash and wound.  Neurological: Denies dizziness, seizures, syncope, weakness, light-headedness, numbness and headaches.  Hematological: Denies adenopathy. Easy bruising, personal or family bleeding history  Psychiatric/Behavioral: Denies suicidal ideation, mood changes, confusion, nervousness, sleep disturbance and agitation  Past Medical History: Past Medical History:  Diagnosis Date  . AAA (abdominal aortic aneurysm) ([place])   . Bilateral hydronephrosis   . Carotid artery stenosis    carotid doppler 06/2012 - Right CCA/Bulb/ICA with chronic occlusion; L vertebral artery with abnormal blood flow; L Bulb/Prox ICA  s/p endarterectomy with mild fibrous plaque, 50% diameter reduction  . CHF (congestive heart failure) ([place])   . Choledocholithiasis 2017  . CKD (chronic kidney disease), stage III   . Coronary artery disease due to lipid rich plaque cardiologist-  dr berry   s/p CABG x6 1997-- cath 12-09-2009 occluded vein to obtuse marginal branch and ramus branch with patent vien to PDA and patent LIMA to LAD, ef 40%-- Myoview 11-24-2011, nonischemic  . Diverticulosis   . Dyspnea on exertion   . GERD (gastroesophageal reflux disease)   . GI bleed   . History of sepsis    10-18-2014 w/ acute  pyelonephritis  . Hyperlipidemia   . Hypertension   . Hypothyroidism   . Ischemic cardiomyopathy    03-25-2010-- per lasts echo EF  50-55%  . PAD (peripheral artery disease) ([place])    09/2010 LEAs - R ABI of 0.45, occluded fem-pop bypass graft, L ABI of 0.59 with occluded SFA; severe arterial insuff  . PVD (peripheral vascular disease) with  claudication ([place])    last duplex 07-04-2015 -- Right CCA and ICA chronic occlusion, 01-60% LICA, Patent vertebral arteries w/ antegrade flow, bilateral normal subclavian arteries   . Retroperitoneal fibrosis   . Tubular adenoma of colon   . Type 2 diabetes mellitus ([place])    monitored by dr Dwyane Dee    Past Surgical History:  Procedure Laterality Date  . AORTA - BILATERAL FEMORAL ARTERY BYPASS GRAFT  1997   and RIGHT FEM-POP   . CARDIAC CATHETERIZATION  12-09-2009  dr al little   EF >40%-- occluded vein to OM & ramus branches, patent vein to PDA, patent LIMA to LAD (Dr. [name], [facility]) - later had thrombectomy of R fem-pop bypass ad R common femoral & profunda femoris artery (Dr. Oneida Alar)  . CARDIOVASCULAR STRESS TEST  11-24-2011   dr berry   Low Risk study: fixed basal to mid inferior attenuation artifact, no reversible ischemia,  normal LV function and wall motion , ef 67%  . CAROTID ENDARTERECTOMY Bilateral right 1994//  left ?  Marland Kitchen CATARACT EXTRACTION W/ INTRAOCULAR LENS  IMPLANT, BILATERAL  2006  . CHOLECYSTECTOMY N/A 09/05/2015   Procedure: ATTEMPTED LAPAROSCOPIC CHOLECYSTECTOMY, EXPLORATORY LAPAROTOMY WITH CHOLECYSTECTOMY;  Surgeon: Autumn Messing III, MD;  Location: [place];  Service: General;  Laterality: N/A;  . COLONOSCOPY    . COLONOSCOPY  10/21/2015  . COLONOSCOPY WITH PROPOFOL N/A 10/21/2015   Procedure: COLONOSCOPY WITH PROPOFOL;  Surgeon: Milus Banister, MD;  Location: [place];  Service: Endoscopy;  Laterality: N/A;  . CORONARY ARTERY BYPASS GRAFT  1997   x6; internal mammary to LAD, SVG to ramus #1 & #2, SVG to OM, SVG to PDA,   . CYSTOSCOPY W/ RETROGRADES Right 08/01/2015   Procedure: CYSTOSCOPY WITH RETROGRADE PYELOGRAM;  Surgeon: Ardis Hughs, MD;  Location: [facility];  Service: Urology;  Laterality: Right;  . CYSTOSCOPY W/ URETERAL STENT PLACEMENT  03/10/2012   Procedure: CYSTOSCOPY WITH RETROGRADE PYELOGRAM/URETERAL STENT PLACEMENT;  Surgeon: Hanley Ben, MD;  Location: [facility];  Service: Urology;  Laterality: Left;  . CYSTOSCOPY W/ URETERAL STENT PLACEMENT Bilateral 03/07/2015   Procedure: BILATERAL RETROGRADE PYELOGRAM AND RIGHT URETERAL STENT PLACEMENT;  Surgeon: Ardis Hughs, MD;  Location: [facility];  Service: Urology;  Laterality: Bilateral;  . CYSTOSCOPY W/ URETERAL STENT PLACEMENT Right 08/01/2015   Procedure: CYSTOSCOPY WITH STENT REPLACEMENT;  Surgeon: Ardis Hughs, MD;  Location: [facility];  Service: Urology;  Laterality: Right;  . ERCP N/A 09/03/2015   Procedure: ENDOSCOPIC RETROGRADE CHOLANGIOPANCREATOGRAPHY (ERCP);  Surgeon: Irene Shipper, MD;  Location: [facility]COPY;  Service: Endoscopy;  Laterality: N/A;  . ESOPHAGOGASTRODUODENOSCOPY    . ESOPHAGOGASTRODUODENOSCOPY N/A 10/19/2015   Procedure: ESOPHAGOGASTRODUODENOSCOPY (EGD);  Surgeon: Gatha Mayer, MD;  Location: [facility]COPY;  Service: Endoscopy;  Laterality: N/A;  . GIVENS CAPSULE STUDY  10/21/2015  . GIVENS CAPSULE STUDY N/A 10/21/2015   Procedure: GIVENS CAPSULE STUDY;  Surgeon: Milus Banister, MD;  Location: [place];  Service: Endoscopy;  Laterality: N/A;  . REPAIR RIGHT FEMORAL PSEUDOANEUYSM/  RIGHT FEM-POP BYPASS GRAFT/  DEBRIDEMENT RIGHT  LOWER EXTREMITIY VENOUS STATUS ULCERS X2  01-12-2005  . TOTAL ABDOMINAL HYSTERECTOMY W/ BILATERAL SALPINGOOPHORECTOMY  1986  . TRANSTHORACIC ECHOCARDIOGRAM  03/25/2010   EF 92-11%, LV systolic function low normal with mild inferoseptal hypocontractility; LA mildly dilated; mod MR; mild TR, RV systolic pressure elevated, mild pulm HTN; AV mildly sclerotic; mild pulm valve regurg; aortic root sclerosis/calcif     Medications: Prior to Admission medications   Medication Sig Start Date End Date Taking? Authorizing Provider  aspirin 81 MG chewable tablet Chew 81 mg by mouth daily.    Yes Historical Provider, MD  calcium-vitamin D (OSCAL WITH D) 500-200 MG-UNIT per tablet Take 1 tablet by  mouth every morning.   Yes Historical Provider, MD  Cyanocobalamin (VITAMIN B 12 PO) Take 1 tablet by mouth daily.    Yes Historical Provider, MD  ferrous sulfate 325 (65 FE) MG tablet Take 325 mg by mouth every evening.    Yes Historical Provider, MD  furosemide (LASIX) 20 MG tablet Take 1 tablet (20 mg total) by mouth daily. 01/22/16  Yes [name], MD  gabapentin (NEURONTIN) 300 MG capsule Take 1 capsule (300 mg total) by mouth 3 (three) times daily as needed. 09/30/15  Yes [name], MD  HYDROcodone-acetaminophen (NORCO/VICODIN) 5-325 MG tablet Take 1 tablet every 6 hours as needed for back pain 12/02/15  Yes [name], MD  insulin aspart (NOVOLOG) 100 UNIT/ML FlexPen Inject 8 units three times a day with meals Patient taking differently: 6 Units. Inject 8 units three times a day with meals 09/30/14  Yes [name], MD  Insulin Detemir (LEVEMIR FLEXTOUCH) 100 UNIT/ML Pen Inject 22 units at bedtime. Patient taking differently: Inject 18 Units into the skin at bedtime.  01/13/15  Yes [name], MD  latanoprost (XALATAN) 0.005 % ophthalmic solution Place 1 drop into both eyes at bedtime. 03/11/14  Yes Historical Provider, MD  levothyroxine (SYNTHROID, LEVOTHROID) 125 MCG tablet Take 125 mcg by mouth daily before breakfast.   Yes Historical Provider, MD  methocarbamol (ROBAXIN) 500 MG tablet Take 1 tablet (500 mg total) by mouth 3 (three) times daily. As needed 01/13/16  Yes [name], MD  metoprolol succinate (TOPROL-XL) 25 MG 24 hr tablet TAKE 1/2 TABLET (=12.5MG )  EVERY MORNING 06/24/15  Yes [name], MD  omeprazole (PRILOSEC) 20 MG capsule TAKE 1 CAPSULE DAILY Patient taking differently: TAKE 1 CAPSULE DAILY--  takes in am 02/12/15  Yes [name], MD  polyethylene glycol (MIRALAX / GLYCOLAX) packet Take 17 g by mouth daily as needed for moderate constipation. 09/08/15  Yes Maryann Mikhail, DO  rosuvastatin (CRESTOR) 20 MG tablet Take 1 tablet (20 mg total) by mouth daily. 09/22/15  Yes [name],  MD  VENTOLIN HFA 108 (90 Base) MCG/ACT inhaler inhale 2 puffs by mouth INTO THE LUNGS every 6 hours if needed for wheezing 12/08/15  Yes [name], MD  vitamin C (ASCORBIC ACID) 500 MG tablet Take 500 mg by mouth at bedtime.    Yes Historical Provider, MD  Vitamin D, Ergocalciferol, (DRISDOL) 50000 units CAPS capsule TAKE 1 CAPSULE BY MOUTH EVERY 7 DAYS ON THURSDAYS 08/19/15  Yes [name], MD  Cranberry 250 MG TABS Take 1 tablet by mouth daily.     Historical Provider, MD  feeding supplement, GLUCERNA SHAKE, (GLUCERNA SHAKE) LIQD Take 237 mLs by mouth 3 (three) times daily between meals. Patient not taking: Reported on 01/23/2016 09/08/15   Velta Addison Mikhail, DO  Insulin Pen Needle (PEN NEEDLES) 31G X 6 MM  MISC Use to inject insulin 4 times per day. 10/06/15   [name], MD    Allergies:  No Known Allergies  Social History:  reports that she quit smoking about 6 years ago. She has a 60.00 pack-year smoking history. She has never used smokeless tobacco. She reports that she drinks about 1.8 oz of alcohol per week . She reports that she does not use drugs.  Family History: Family History  Problem Relation Age of Onset  . Congestive Heart Failure Mother   . Diabetes Mother   . Stroke Father   . Cancer Maternal Aunt     Breast cancer    Physical Exam: Blood pressure 158/68, pulse 66, temperature 97.8 F (36.6 C), temperature source Oral, resp. rate 25, weight 66.2 kg (146 lb), SpO2 93 %. General: Alert, awake, oriented x3, in no acute distress. HEENT: normocephalic, atraumatic, anicteric sclera, pink conjunctiva, pupils equal and reactive to light and accomodation, oropharynx clear Neck: supple, no masses or lymphadenopathy, no goiter, no bruits  Heart: Regular rate and rhythm, without murmurs, rubs or gallops. Lungs: Bibasilar crackles bilaterally Abdomen: Soft, nontender, nondistended, positive bowel sounds, no masses. Extremities: No clubbing, cyanosis or edema with positive pedal  pulses. Neuro: Grossly intact, no focal neurological deficits, strength 5/5 upper and lower extremities bilaterally Psych: alert and oriented x 3, normal mood and affect Skin: no rashes or lesions, warm and dry   LABS on Admission:  Basic Metabolic Panel:  Recent Labs Lab 01/22/16 1456 01/23/16 1413  NA 140 140  K 4.6 4.7  CL 106 107  CO2 28 24  GLUCOSE 149* 141*  BUN 25* 27*  CREATININE 1.57* 1.56*  CALCIUM 9.3 9.1   Liver Function Tests:  Recent Labs Lab 01/22/16 1456 01/23/16 1413  AST 14 17  ALT 8 10*  ALKPHOS 67 69  BILITOT 0.4 0.7  PROT 6.8 7.0  ALBUMIN 3.6 3.5   No results for input(s): LIPASE, AMYLASE in the last 168 hours. No results for input(s): AMMONIA in the last 168 hours. CBC:  Recent Labs Lab 01/22/16 1456 01/23/16 1413  WBC 7.9 6.8  NEUTROABS  --  5.6  HGB 10.7* 10.8*  HCT 33.0* 34.7*  MCV 86.1 90.6  PLT 261.0 249   Cardiac Enzymes:  Recent Labs Lab 01/23/16 1413  TROPONINI <0.03   BNP: Invalid input(s): POCBNP CBG: No results for input(s): GLUCAP in the last 168 hours.  Radiological Exams on Admission:  No results found.  *I have personally reviewed the images above*  EKG: Independently reviewed. EKG showed 113, atrial fibrillation, nonspecific ST-T wave changes   Assessment/Plan Principal Problem:   Acute on chronic Diastolic CHF (congestive heart failure) (HCC) - Placed on CHF protocol, strict I's and O's and daily weights - Started on IV Lasix 40 mg daily, monitor creatinine function closely - Cardiology consulted - Obtain d-dimer, serial troponins, 2-D echocardiogram - I will hold off on CT angiogram of the chest, creatinine 1.56 at this time unless d-dimer positive  Active Problems: Atrial fibrillation by EKG - No prior history of atrial fibrillation per patient - Reviewed cardiology notes by Dr. Quay Burow from September 2017, she recently had GI bleed and was taken off Plavix at that time. She is not a  candidate for anticoagulation.    Diabetes mellitus, type II ([place]) - Placed on sliding scale insulin, follow hemoglobin A1c  Hypertension - Continue Toprol-XL  Hyperlipidemia - Continue Crestor   Hypothyroidism -Continue Synthroid    DVT prophylaxis:  Lovenox    CODE STATUS:  full CODE STATUS   Consults called: cardiology  Family Communication: Admission, patients condition and plan of care including tests being ordered have been discussed with the patient and daughter who indicates understanding and agree with the plan and Code Status  Admission status: tele inpatient   Disposition plan: Further plan will depend as patient's clinical course evolves and further radiologic and laboratory data become available. Likely home when stable  At the time of admission, it appears that the appropriate admission status for this patient is INPATIENT . This is judged to be reasonable and necessary in order to provide the required intensity of service to ensure the patient's safety given the presenting symptoms, physical exam findings, and initial radiographic and laboratory data in the context of their chronic comorbidities.        Time Spent on Admission: 67mins   Devansh Riese M.D. [facility] 01/23/2016, 3:47 PM Pager: [phone]  If 7PM-7AM, please contact night-coverage [email or web address] Password [email or web address]

## 2016-01-23 NOTE — ED Notes (Signed)
ED Provider at bedside. 

## 2016-01-23 NOTE — ED Notes (Signed)
Post transferring to bed/activity oxygen level 89% on RA. Pt oxygen level post triage and resting 93-95% on RA.

## 2016-01-23 NOTE — Progress Notes (Signed)
CRITICAL VALUE ALERT  Critical value received:  Troponin  Date of notification:  01/23/16  Time of notification: 1840  Critical value read back: yes  Nurse who received alert: Jodell Cipro  MD notified (1st page):  1800  Time of first page:  MD notified (2nd page):  Time of second page:  Responding MD:  Time MD responded:

## 2016-01-24 ENCOUNTER — Inpatient Hospital Stay (HOSPITAL_COMMUNITY): Payer: Medicare Other

## 2016-01-24 DIAGNOSIS — I5041 Acute combined systolic (congestive) and diastolic (congestive) heart failure: Secondary | ICD-10-CM

## 2016-01-24 DIAGNOSIS — I509 Heart failure, unspecified: Secondary | ICD-10-CM

## 2016-01-24 DIAGNOSIS — I484 Atypical atrial flutter: Secondary | ICD-10-CM

## 2016-01-24 LAB — ECHOCARDIOGRAM COMPLETE
E decel time: 218 msec
EERAT: 18.51
FS: 20 % — AB (ref 28–44)
HEIGHTINCHES: 66 in
IVS/LV PW RATIO, ED: 0.63
LA ID, A-P, ES: 44 mm
LA diam end sys: 44 mm
LA diam index: 2.55 cm/m2
LA vol A4C: 80.4 ml
LA vol index: 41.9 mL/m2
LA vol: 72.2 mL
LDCA: 2.84 cm2
LV E/e' medial: 18.51
LV E/e'average: 18.51
LV TDI E'LATERAL: 4.35
LVELAT: 4.35 cm/s
LVOT diameter: 19 mm
MV Dec: 218
MV pk A vel: 50.4 m/s
MVPG: 3 mmHg
MVPKEVEL: 80.5 m/s
PISA EROA: 0.08 cm2
PW: 16.2 mm — AB (ref 0.6–1.1)
TDI e' medial: 5
VTI: 189 cm
WEIGHTICAEL: 2244.8 [oz_av]

## 2016-01-24 LAB — BASIC METABOLIC PANEL
ANION GAP: 9 (ref 5–15)
BUN: 31 mg/dL — ABNORMAL HIGH (ref 6–20)
CO2: 27 mmol/L (ref 22–32)
Calcium: 8.8 mg/dL — ABNORMAL LOW (ref 8.9–10.3)
Chloride: 104 mmol/L (ref 101–111)
Creatinine, Ser: 2.05 mg/dL — ABNORMAL HIGH (ref 0.44–1.00)
GFR calc Af Amer: 26 mL/min — ABNORMAL LOW (ref >60)
GFR, EST NON AFRICAN AMERICAN: 23 mL/min — AB (ref >60)
GLUCOSE: 48 mg/dL — AB (ref 65–99)
POTASSIUM: 4.4 mmol/L (ref 3.5–5.1)
Sodium: 140 mmol/L (ref 135–145)

## 2016-01-24 LAB — TROPONIN I: TROPONIN I: 0.03 ng/mL — AB (ref <0.03)

## 2016-01-24 LAB — GLUCOSE, CAPILLARY
GLUCOSE-CAPILLARY: 79 mg/dL (ref 65–99)
GLUCOSE-CAPILLARY: 143 mg/dL — AB (ref 65–99)
Glucose-Capillary: 112 mg/dL — ABNORMAL HIGH (ref 65–99)

## 2016-01-24 NOTE — Progress Notes (Addendum)
PROGRESS NOTE  [name]  GGE:[id number] DOB: 1940-07-28 DOA: 01/23/2016 PCP: [name], MD  Outpatient Specialists: Cardiology: Dr. Gwenlyn Found  Brief Narrative: Patient is a 75 year old female with history of CAD s/p CABG 1997, HTN, HLD, hypothyroidism, Y6TK, and diastolic CHF presented 35/46 for dyspnea on exertion. Also having orthopnea and mild pedal edema. The prior day she was noted to be hypoxemic with crackles on exam by PCP. CXR ordered by PCP appeared normal. On arrival to the ED she was hypoxemic, and admitted for further work up. IV diuresis was started and cardiology was consulted for acute CHF as well as atrial flutter/AFib at admission which has since resolved.  Assessment & Plan: Principal Problem:   Acute on chronic systolic CHF (congestive heart failure) (HCC) Active Problems:   Diabetes mellitus, type II (HCC)   Acute CHF (congestive heart failure) (HCC)  Acute hypoxemic respiratory failure: Due to acute CHF as below.  - Treat CHF as below. - DDimer checked at admission and is elevated: As she has a history of GI bleed, risks of anticoagulation even if there is a PE are very high. Plan to check V/Q scan if no improvement with diuresis.  Acute on chronic diastolic CHF: Echocardiogram June 2017 showed normal LV systolic function with moderate left ventricular hypertrophy. Grade 2 diastolic dysfunction with elevated LV filling pressure. - Continue lasix 40mg  IV daily, cardiology recommendations appreciated, considering transition to po in AM. - Strict I/O, daily wt  - Repeat echocardiogram ordered  Atypical atrial flutter: Resolved, now NSR. No prior history of atrial fibrillation per patient. Last TSH wnl. - Increase metoprolol to 50mg  daily per cardiology.   CAD s/p CABG 1997: Nuclear study 2013 without ischemia.  - Plavix stopped due to h/o GI bleed.  - Continue ASA, statin, beta blocker  Angiodysplasia s/p GI bleed: Thought to be at continued high risk for GI  bleeding with anticoagulation. Patient aware of risks and benefits of anticoagulation and agrees with holding. - Continue ASA - Monitor CBC, signs of bleeding  History of T2DM: Last HbA1c was recently 5.4% - Placed on sliding scale insulin  Hypertension - Continue Toprol-XL at 50mg  daily  Hyperlipidemia - Continue Crestor   Hypothyroidism: TSH 4.33 recently, will not recheck at this time.  -Continue Synthroid    DVT prophylaxis: lovenox Code Status: Full Family Communication: None at bedside. Disposition Plan: Discharge to home once acute exacerbation of CHF resolved. Currently requiring oxygen and IV diuresis.  Consultants:   Cardiology, Dr. Stanford Breed  Procedures:   Echo ordered  Antimicrobials:  None   Subjective: Patient feels improved from admission, still some dyspnea with any exertion. Completely independent at home. No chest pain. Leg swelling improved.   Objective: Vitals:   01/23/16 1714 01/23/16 2027 01/24/16 0452 01/24/16 0624  BP:  (!) 157/76 (!) 156/76   Pulse: (!) 106 73 62   Resp: 20 (!) 24 20   Temp:  97.8 F (36.6 C) 97.7 F (36.5 C)   TempSrc:  Oral Oral   SpO2:  96% 98%   Weight:    63.6 kg (140 lb 4.8 oz)  Height:        Intake/Output Summary (Last 24 hours) at 01/24/16 1156 Last data filed at 01/24/16 0630  Gross per 24 hour  Intake              660 ml  Output             2200 ml  Net            -  1540 ml   Filed Weights   01/23/16 1325 01/23/16 1642 01/24/16 0624  Weight: 66.2 kg (146 lb) 64.9 kg (143 lb) 63.6 kg (140 lb 4.8 oz)    Examination: General exam: 75 y.o. female in no distress  Respiratory system: Non-labored breathing with supplemental oxygen at rest. Bibasilar crackles. Cardiovascular system: Regular rate and rhythm. No murmur, rub, or gallop. No JVD, and no pedal edema. Gastrointestinal system: Abdomen soft, non-tender, non-distended, with normoactive bowel sounds. No organomegaly or masses felt. Central nervous  system: Alert and oriented. No focal neurological deficits. Extremities: Warm, no deformities Skin: No rashes, lesions no ulcers Psychiatry: Judgement and insight appear normal. Mood & affect appropriate.   Data Reviewed: I have personally reviewed following labs and imaging studies  CBC:  Recent Labs Lab 01/22/16 1456 01/23/16 1413  WBC 7.9 6.8  NEUTROABS  --  5.6  HGB 10.7* 10.8*  HCT 33.0* 34.7*  MCV 86.1 90.6  PLT 261.0 397   Basic Metabolic Panel:  Recent Labs Lab 01/22/16 1456 01/23/16 1413 01/24/16 0443  NA 140 140 140  K 4.6 4.7 4.4  CL 106 107 104  CO2 28 24 27   GLUCOSE 149* 141* 48*  BUN 25* 27* 31*  CREATININE 1.57* 1.56* 2.05*  CALCIUM 9.3 9.1 8.8*   GFR: Estimated Creatinine Clearance: 22.5 mL/min (by C-G formula based on SCr of 2.05 mg/dL (H)). Liver Function Tests:  Recent Labs Lab 01/22/16 1456 01/23/16 1413  AST 14 17  ALT 8 10*  ALKPHOS 67 69  BILITOT 0.4 0.7  PROT 6.8 7.0  ALBUMIN 3.6 3.5   No results for input(s): LIPASE, AMYLASE in the last 168 hours. No results for input(s): AMMONIA in the last 168 hours. Coagulation Profile: No results for input(s): INR, PROTIME in the last 168 hours. Cardiac Enzymes:  Recent Labs Lab 01/23/16 1413 01/23/16 1653 01/23/16 2217 01/24/16 0443  TROPONINI <0.03 0.03* 0.04* 0.03*   BNP (last 3 results) No results for input(s): PROBNP in the last 8760 hours. HbA1C: No results for input(s): HGBA1C in the last 72 hours. CBG:  Recent Labs Lab 01/23/16 1704 01/23/16 2036 01/24/16 0745 01/24/16 1132  GLUCAP 121* 130* 79 112*   Lipid Profile: No results for input(s): CHOL, HDL, LDLCALC, TRIG, CHOLHDL, LDLDIRECT in the last 72 hours. Thyroid Function Tests: No results for input(s): TSH, T4TOTAL, FREET4, T3FREE, THYROIDAB in the last 72 hours. Anemia Panel: No results for input(s): VITAMINB12, FOLATE, FERRITIN, TIBC, IRON, RETICCTPCT in the last 72 hours. Urine analysis:    Component Value  Date/Time   COLORURINE YELLOW 10/01/2015 1630   APPEARANCEUR CLEAR 10/01/2015 1630   LABSPEC 1.020 10/01/2015 1630   PHURINE 5.5 10/01/2015 1630   GLUCOSEU NEGATIVE 10/01/2015 1630   HGBUR LARGE (A) 10/01/2015 1630   BILIRUBINUR NEGATIVE 10/01/2015 1630   KETONESUR NEGATIVE 10/01/2015 1630   PROTEINUR >300 (A) 08/28/2015 1812   UROBILINOGEN 0.2 10/01/2015 1630   NITRITE NEGATIVE 10/01/2015 1630   LEUKOCYTESUR LARGE (A) 10/01/2015 1630   Sepsis Labs: @LABRCNTIP (procalcitonin:4,lacticidven:4)  )No results found for this or any previous visit (from the past 240 hour(s)).   Radiology Studies: Dg Chest 2 View  Result Date: 01/23/2016 CLINICAL DATA:  Shortness of breath. EXAM: CHEST  2 VIEW COMPARISON:  Radiographs of January 22, 2016. FINDINGS: Stable cardiomediastinal silhouette. No pneumothorax or pleural effusion is noted. Status post coronary artery bypass graft. Stable scarring and pleural thickening is noted in left lung base. Right lung is clear. No acute abnormality seen. Bony  thorax is unremarkable. IMPRESSION: Stable left basilar scarring and pleural thickening. No acute cardiopulmonary abnormality seen. Electronically Signed   By: Marijo Conception, M.D.   On: 01/23/2016 14:17   Dg Chest 2 View  Result Date: 01/23/2016 CLINICAL DATA:  Increasing shortness of breath for the past week. History of CHF, hypertension, ischemic cardiomyopathy, and coronary artery disease status post CABG. EXAM: CHEST  2 VIEW COMPARISON:  Portable chest x-ray of October 18, 2015 FINDINGS: The lungs are hyperinflated. There is chronic blunting of the costophrenic angles. Increased density along the pleural surface at the left lung base is consistent with previous talc pleurodesis. There is no alveolar infiltrate. The heart is normal in size. The pulmonary vascularity is not engorged. There are post CABG changes. There is calcification in the wall of the aortic arch. The bony thorax exhibits no acute  abnormality. IMPRESSION: COPD. Chronic pleuro parenchymal changes especially at the left lung base. No CHF or alveolar pneumonia. Previous CABG. If the patient has dyspnea persists, chest CT scanning would be a useful next imaging step. Thoracic aortic atherosclerosis. Electronically Signed   By: David  Martinique M.D.   On: 01/23/2016 07:31    Scheduled Meds: . aspirin  81 mg Oral Daily  . calcium-vitamin D  1 tablet Oral q morning - 10a  . enoxaparin (LOVENOX) injection  30 mg Subcutaneous Q24H  . ferrous sulfate  325 mg Oral QPM  . furosemide  40 mg Intravenous Daily  . insulin aspart  0-5 Units Subcutaneous QHS  . insulin aspart  0-9 Units Subcutaneous TID WC  . insulin detemir  18 Units Subcutaneous QHS  . latanoprost  1 drop Both Eyes QHS  . levothyroxine  125 mcg Oral QAC breakfast  . methocarbamol  500 mg Oral TID  . metoprolol succinate  25 mg Oral Daily  . pantoprazole  40 mg Oral Daily  . rosuvastatin  20 mg Oral Daily  . sodium chloride flush  3 mL Intravenous Q12H  . vitamin C  500 mg Oral QHS   Continuous Infusions:   LOS: 1 day   Time spent: 25 minutes.  Vance Gather, MD [facility] Pager [phone]  If 7PM-7AM, please contact night-coverage [email or web address] Password [email or web address] 01/24/2016, 11:56 AM

## 2016-01-24 NOTE — Progress Notes (Signed)
  Echocardiogram 2D Echocardiogram has been performed.  [name] [name] 01/24/2016, 3:20 PM

## 2016-01-24 NOTE — Consult Note (Signed)
Primary cardiologist: Dr Gwenlyn Found  HPI: 75 yo female with PMH CAD s/p CABG, ICM for evaluation of acute on chronic systolic CHF and atrial fibrillation. Patient is status post coronary artery bypass and graft in 1997. Nuclear study September 2013 showed no ischemia. Last echocardiogram June 2017 showed normal LV systolic function with moderate left ventricular hypertrophy. Grade 2 diastolic dysfunction with elevated LV filling pressure. Patient was recently admitted following GI bleed. Her hemoglobin decreased to 5.1 and she required transfusion. EGD normal 8/17; colonoscopy revealed diverticulosis and polyps removed; capsule endoscopy revealed multiple nonbleeding angiodysplastic lesions in mid and distal bowel. Patient states that for the 2 days prior to admission she noticed increasing dyspnea on exertion, orthopnea but no PND. She apparently had mild pedal edema as well. She denies chest pain, palpitations or syncope. No fevers, chills or productive cough.  Medications Prior to Admission  Medication Sig Dispense Refill  . aspirin 81 MG chewable tablet Chew 81 mg by mouth daily.     . calcium-vitamin D (OSCAL WITH D) 500-200 MG-UNIT per tablet Take 1 tablet by mouth every morning.    . Cyanocobalamin (VITAMIN B 12 PO) Take 1 tablet by mouth daily.     . ferrous sulfate 325 (65 FE) MG tablet Take 325 mg by mouth every evening.     . furosemide (LASIX) 20 MG tablet Take 1 tablet (20 mg total) by mouth daily. 30 tablet 0  . gabapentin (NEURONTIN) 300 MG capsule Take 1 capsule (300 mg total) by mouth 3 (three) times daily as needed. 90 capsule 3  . HYDROcodone-acetaminophen (NORCO/VICODIN) 5-325 MG tablet Take 1 tablet every 6 hours as needed for back pain 60 tablet 0  . insulin aspart (NOVOLOG) 100 UNIT/ML FlexPen Inject 8 units three times a day with meals (Patient taking differently: 6 Units. Inject 8 units three times a day with meals) 15 mL 5  . Insulin Detemir (LEVEMIR FLEXTOUCH) 100 UNIT/ML  Pen Inject 22 units at bedtime. (Patient taking differently: Inject 18 Units into the skin at bedtime. ) 30 mL 1  . latanoprost (XALATAN) 0.005 % ophthalmic solution Place 1 drop into both eyes at bedtime.  0  . levothyroxine (SYNTHROID, LEVOTHROID) 125 MCG tablet Take 125 mcg by mouth daily before breakfast.    . methocarbamol (ROBAXIN) 500 MG tablet Take 1 tablet (500 mg total) by mouth 3 (three) times daily. As needed 90 tablet 0  . metoprolol succinate (TOPROL-XL) 25 MG 24 hr tablet TAKE 1/2 TABLET (=12.5MG)  EVERY MORNING 45 tablet 3  . omeprazole (PRILOSEC) 20 MG capsule TAKE 1 CAPSULE DAILY (Patient taking differently: TAKE 1 CAPSULE DAILY--  takes in am) 90 capsule 3  . polyethylene glycol (MIRALAX / GLYCOLAX) packet Take 17 g by mouth daily as needed for moderate constipation. 14 each 0  . rosuvastatin (CRESTOR) 20 MG tablet Take 1 tablet (20 mg total) by mouth daily. 90 tablet 1  . VENTOLIN HFA 108 (90 Base) MCG/ACT inhaler inhale 2 puffs by mouth INTO THE LUNGS every 6 hours if needed for wheezing 18 Inhaler 4  . vitamin C (ASCORBIC ACID) 500 MG tablet Take 500 mg by mouth at bedtime.     . Vitamin D, Ergocalciferol, (DRISDOL) 50000 units CAPS capsule TAKE 1 CAPSULE BY MOUTH EVERY 7 DAYS ON THURSDAYS 4 capsule 5  . Cranberry 250 MG TABS Take 1 tablet by mouth daily.     . feeding supplement, GLUCERNA SHAKE, (GLUCERNA SHAKE) LIQD Take 237 mLs by mouth  3 (three) times daily between meals. (Patient not taking: Reported on 01/23/2016)  0  . Insulin Pen Needle (PEN NEEDLES) 31G X 6 MM MISC Use to inject insulin 4 times per day. 150 each 2    No Known Allergies  Past Medical History:  Diagnosis Date  . AAA (abdominal aortic aneurysm) ([place])   . Bilateral hydronephrosis   . Carotid artery stenosis    carotid doppler 06/2012 - Right CCA/Bulb/ICA with chronic occlusion; L vertebral artery with abnormal blood flow; L Bulb/Prox ICA  s/p endarterectomy with mild fibrous plaque, 50% diameter  reduction  . CHF (congestive heart failure) ([place])   . Choledocholithiasis 2017  . CKD (chronic kidney disease), stage III   . Coronary artery disease due to lipid rich plaque cardiologist-  dr berry   s/p CABG x6 1997-- cath 12-09-2009 occluded vein to obtuse marginal branch and ramus branch with patent vien to PDA and patent LIMA to LAD, ef 40%-- Myoview 11-24-2011, nonischemic  . Diverticulosis   . Dyspnea on exertion   . GERD (gastroesophageal reflux disease)   . GI bleed   . History of sepsis    10-18-2014 w/ acute pyelonephritis  . Hyperlipidemia   . Hypertension   . Hypothyroidism   . Ischemic cardiomyopathy    03-25-2010-- per lasts echo EF  50-55%  . PAD (peripheral artery disease) ([place])    09/2010 LEAs - R ABI of 0.45, occluded fem-pop bypass graft, L ABI of 0.59 with occluded SFA; severe arterial insuff  . PVD (peripheral vascular disease) with claudication ([place])    last duplex 07-04-2015 -- Right CCA and ICA chronic occlusion, 66-44% LICA, Patent vertebral arteries w/ antegrade flow, bilateral normal subclavian arteries   . Retroperitoneal fibrosis   . Tubular adenoma of colon   . Type 2 diabetes mellitus ([place])    monitored by dr Dwyane Dee    Past Surgical History:  Procedure Laterality Date  . AORTA - BILATERAL FEMORAL ARTERY BYPASS GRAFT  1997   and RIGHT FEM-POP   . CARDIAC CATHETERIZATION  12-09-2009  dr al little   EF >40%-- occluded vein to OM & ramus branches, patent vein to PDA, patent LIMA to LAD (Dr. [name], [facility]) - later had thrombectomy of R fem-pop bypass ad R common femoral & profunda femoris artery (Dr. Oneida Alar)  . CARDIOVASCULAR STRESS TEST  11-24-2011   dr berry   Low Risk study: fixed basal to mid inferior attenuation artifact, no reversible ischemia,  normal LV function and wall motion , ef 67%  . CAROTID ENDARTERECTOMY Bilateral right 1994//  left ?  Marland Kitchen CATARACT EXTRACTION W/ INTRAOCULAR LENS  IMPLANT, BILATERAL  2006  . CHOLECYSTECTOMY N/A 09/05/2015    Procedure: ATTEMPTED LAPAROSCOPIC CHOLECYSTECTOMY, EXPLORATORY LAPAROTOMY WITH CHOLECYSTECTOMY;  Surgeon: Autumn Messing III, MD;  Location: [place];  Service: General;  Laterality: N/A;  . COLONOSCOPY    . COLONOSCOPY  10/21/2015  . COLONOSCOPY WITH PROPOFOL N/A 10/21/2015   Procedure: COLONOSCOPY WITH PROPOFOL;  Surgeon: Milus Banister, MD;  Location: [place];  Service: Endoscopy;  Laterality: N/A;  . CORONARY ARTERY BYPASS GRAFT  1997   x6; internal mammary to LAD, SVG to ramus #1 & #2, SVG to OM, SVG to PDA,   . CYSTOSCOPY W/ RETROGRADES Right 08/01/2015   Procedure: CYSTOSCOPY WITH RETROGRADE PYELOGRAM;  Surgeon: Ardis Hughs, MD;  Location: [facility];  Service: Urology;  Laterality: Right;  . CYSTOSCOPY W/ URETERAL STENT PLACEMENT  03/10/2012   Procedure: CYSTOSCOPY WITH  RETROGRADE PYELOGRAM/URETERAL STENT PLACEMENT;  Surgeon: Hanley Ben, MD;  Location: [facility];  Service: Urology;  Laterality: Left;  . CYSTOSCOPY W/ URETERAL STENT PLACEMENT Bilateral 03/07/2015   Procedure: BILATERAL RETROGRADE PYELOGRAM AND RIGHT URETERAL STENT PLACEMENT;  Surgeon: Ardis Hughs, MD;  Location: [facility];  Service: Urology;  Laterality: Bilateral;  . CYSTOSCOPY W/ URETERAL STENT PLACEMENT Right 08/01/2015   Procedure: CYSTOSCOPY WITH STENT REPLACEMENT;  Surgeon: Ardis Hughs, MD;  Location[facility];  Service: Urology;  Laterality: Right;  . ERCP N/A 09/03/2015   Procedure: ENDOSCOPIC RETROGRADE CHOLANGIOPANCREATOGRAPHY (ERCP);  Surgeon: Irene Shipper, MD;  Location: [facility] ENDOSCOPY;  Service: Endoscopy;  Laterality: N/A;  . ESOPHAGOGASTRODUODENOSCOPY    . ESOPHAGOGASTRODUODENOSCOPY N/A 10/19/2015   Procedure: ESOPHAGOGASTRODUODENOSCOPY (EGD);  Surgeon: Gatha Mayer, MD;  Location: [facility] ENDOSCOPY;  Service: Endoscopy;  Laterality: N/A;  . GIVENS CAPSULE STUDY  10/21/2015  . GIVENS CAPSULE STUDY N/A 10/21/2015   Procedure: GIVENS CAPSULE STUDY;   Surgeon: Milus Banister, MD;  Location: [place];  Service: Endoscopy;  Laterality: N/A;  . REPAIR RIGHT FEMORAL PSEUDOANEUYSM/  RIGHT FEM-POP BYPASS GRAFT/  DEBRIDEMENT RIGHT LOWER EXTREMITIY VENOUS STATUS ULCERS X2  01-12-2005  . TOTAL ABDOMINAL HYSTERECTOMY W/ BILATERAL SALPINGOOPHORECTOMY  1986  . TRANSTHORACIC ECHOCARDIOGRAM  03/25/2010   EF 61-95%, LV systolic function low normal with mild inferoseptal hypocontractility; LA mildly dilated; mod MR; mild TR, RV systolic pressure elevated, mild pulm HTN; AV mildly sclerotic; mild pulm valve regurg; aortic root sclerosis/calcif     Social History   Social History  . Marital status: Divorced    Spouse name: N/A  . Number of children: 1  . Years of education: 71   Occupational History  . Not on file.   Social History Main Topics  . Smoking status: Former Smoker    Packs/day: 2.00    Years: 30.00    Quit date: 06/07/2009  . Smokeless tobacco: Never Used  . Alcohol use 1.8 oz/week    3 Standard drinks or equivalent per week     Comment: ocassional  . Drug use: No  . Sexual activity: Not on file   Other Topics Concern  . Not on file   Social History Narrative   Lives alone   Worked 65 yrs [employer]   1 daughter    Family History  Problem Relation Age of Onset  . Congestive Heart Failure Mother   . Diabetes Mother   . Stroke Father   . Cancer Maternal Aunt     Breast cancer    ROS:  Neck pain but no fevers or chills, productive cough, hemoptysis, dysphasia, odynophagia, melena, hematochezia, dysuria, hematuria, rash, seizure activity, claudication. Remaining systems are negative.  Physical Exam:   Blood pressure (!) 156/76, pulse 62, temperature 97.7 F (36.5 C), temperature source Oral, resp. rate 20, height _0  (1.676 m), weight 140 lb 4.8 oz (63.6 kg), SpO2 98 %.  General:  Well developed/frail in NAD Skin warm/dry Patient not depressed No peripheral clubbing Back-normal HEENT-normal/normal  eyelids Neck supple/normal carotid upstroke bilaterally; no bruits; no JVD; no thyromegaly chest - mildly diminished BS throughout CV - RRR/normal S1 and S2; no murmurs, rubs or gallops;  PMI nondisplaced Abdomen -NT/ND, no HSM, no mass, + bowel sounds, no bruit 2+ femoral pulses, no bruits Ext-no edema, chords; diminished distal pulses Neuro-grossly nonfocal  ECG atrial flutter, right axis deviation, left ventricular hypertrophy with repolarization abnormality.   Results for orders  placed or performed during the hospital encounter of 01/23/16 (from the past 48 hour(s))  Comprehensive metabolic panel     Status: Abnormal   Collection Time: 01/23/16  2:13 PM  Result Value Ref Range   Sodium 140 135 - 145 mmol/L   Potassium 4.7 3.5 - 5.1 mmol/L   Chloride 107 101 - 111 mmol/L   CO2 24 22 - 32 mmol/L   Glucose, Bld 141 (H) 65 - 99 mg/dL   BUN 27 (H) 6 - 20 mg/dL   Creatinine, Ser 1.56 (H) 0.44 - 1.00 mg/dL   Calcium 9.1 8.9 - 10.3 mg/dL   Total Protein 7.0 6.5 - 8.1 g/dL   Albumin 3.5 3.5 - 5.0 g/dL   AST 17 15 - 41 U/L   ALT 10 (L) 14 - 54 U/L   Alkaline Phosphatase 69 38 - 126 U/L   Total Bilirubin 0.7 0.3 - 1.2 mg/dL   GFR calc non Af Amer 32 (L) >60 mL/min   GFR calc Af Amer 37 (L) >60 mL/min    Comment: (NOTE) The eGFR has been calculated using the CKD EPI equation. This calculation has not been validated in all clinical situations. eGFR's persistently <60 mL/min signify possible Chronic Kidney Disease.    Anion gap 9 5 - 15  CBC WITH DIFFERENTIAL     Status: Abnormal   Collection Time: 01/23/16  2:13 PM  Result Value Ref Range   WBC 6.8 4.0 - 10.5 K/uL   RBC 3.83 (L) 3.87 - 5.11 MIL/uL   Hemoglobin 10.8 (L) 12.0 - 15.0 g/dL   HCT 34.7 (L) 36.0 - 46.0 %   MCV 90.6 78.0 - 100.0 fL   MCH 28.2 26.0 - 34.0 pg   MCHC 31.1 30.0 - 36.0 g/dL   RDW 14.8 11.5 - 15.5 %   Platelets 249 150 - 400 K/uL   Neutrophils Relative % 82 %   Neutro Abs 5.6 1.7 - 7.7 K/uL   Lymphocytes  Relative 12 %   Lymphs Abs 0.8 0.7 - 4.0 K/uL   Monocytes Relative 5 %   Monocytes Absolute 0.3 0.1 - 1.0 K/uL   Eosinophils Relative 1 %   Eosinophils Absolute 0.0 0.0 - 0.7 K/uL   Basophils Relative 0 %   Basophils Absolute 0.0 0.0 - 0.1 K/uL  Troponin I (MHP)     Status: None   Collection Time: 01/23/16  2:13 PM  Result Value Ref Range   Troponin I <0.03 <0.03 ng/mL  Brain natriuretic peptide     Status: Abnormal   Collection Time: 01/23/16  2:13 PM  Result Value Ref Range   B Natriuretic Peptide 1,040.5 (H) 0.0 - 100.0 pg/mL  D-dimer, quantitative (not at [facility])     Status: Abnormal   Collection Time: 01/23/16  2:13 PM  Result Value Ref Range   D-Dimer, Quant 1.63 (H) 0.00 - 0.50 ug/mL-FEU    Comment: (NOTE) At the manufacturer cut-off of 0.50 ug/mL FEU, this assay has been documented to exclude PE with a sensitivity and negative predictive value of 97 to 99%.  At this time, this assay has not been approved by the FDA to exclude DVT/VTE. Results should be correlated with clinical presentation.   Troponin I (q 6hr x 3)     Status: Abnormal   Collection Time: 01/23/16  4:53 PM  Result Value Ref Range   Troponin I 0.03 (HH) <0.03 ng/mL    Comment: CRITICAL RESULT CALLED TO, READ BACK BY AND VERIFIED WITH:  GUNDLACH,D AT 0626 ON 01/23/16 BY MOSLEY,J   Glucose, capillary     Status: Abnormal   Collection Time: 01/23/16  5:04 PM  Result Value Ref Range   Glucose-Capillary 121 (H) 65 - 99 mg/dL  Glucose, capillary     Status: Abnormal   Collection Time: 01/23/16  8:36 PM  Result Value Ref Range   Glucose-Capillary 130 (H) 65 - 99 mg/dL  Troponin I (q 6hr x 3)     Status: Abnormal   Collection Time: 01/23/16 10:17 PM  Result Value Ref Range   Troponin I 0.04 (HH) <0.03 ng/mL    Comment: CRITICAL VALUE NOTED.  VALUE IS CONSISTENT WITH PREVIOUSLY REPORTED AND CALLED VALUE.  Troponin I (q 6hr x 3)     Status: Abnormal   Collection Time: 01/24/16  4:43 AM  Result Value Ref  Range   Troponin I 0.03 (HH) <0.03 ng/mL    Comment: CRITICAL VALUE NOTED.  VALUE IS CONSISTENT WITH PREVIOUSLY REPORTED AND CALLED VALUE.  Basic metabolic panel     Status: Abnormal   Collection Time: 01/24/16  4:43 AM  Result Value Ref Range   Sodium 140 135 - 145 mmol/L   Potassium 4.4 3.5 - 5.1 mmol/L   Chloride 104 101 - 111 mmol/L   CO2 27 22 - 32 mmol/L   Glucose, Bld 48 (L) 65 - 99 mg/dL   BUN 31 (H) 6 - 20 mg/dL   Creatinine, Ser 2.05 (H) 0.44 - 1.00 mg/dL   Calcium 8.8 (L) 8.9 - 10.3 mg/dL   GFR calc non Af Amer 23 (L) >60 mL/min   GFR calc Af Amer 26 (L) >60 mL/min    Comment: (NOTE) The eGFR has been calculated using the CKD EPI equation. This calculation has not been validated in all clinical situations. eGFR's persistently <60 mL/min signify possible Chronic Kidney Disease.    Anion gap 9 5 - 15    Dg Chest 2 View  Result Date: 01/23/2016 CLINICAL DATA:  Shortness of breath. EXAM: CHEST  2 VIEW COMPARISON:  Radiographs of January 22, 2016. FINDINGS: Stable cardiomediastinal silhouette. No pneumothorax or pleural effusion is noted. Status post coronary artery bypass graft. Stable scarring and pleural thickening is noted in left lung base. Right lung is clear. No acute abnormality seen. Bony thorax is unremarkable. IMPRESSION: Stable left basilar scarring and pleural thickening. No acute cardiopulmonary abnormality seen. Electronically Signed   By: Marijo Conception, M.D.   On: 01/23/2016 14:17   Dg Chest 2 View  Result Date: 01/23/2016 CLINICAL DATA:  Increasing shortness of breath for the past week. History of CHF, hypertension, ischemic cardiomyopathy, and coronary artery disease status post CABG. EXAM: CHEST  2 VIEW COMPARISON:  Portable chest x-ray of October 18, 2015 FINDINGS: The lungs are hyperinflated. There is chronic blunting of the costophrenic angles. Increased density along the pleural surface at the left lung base is consistent with previous talc pleurodesis.  There is no alveolar infiltrate. The heart is normal in size. The pulmonary vascularity is not engorged. There are post CABG changes. There is calcification in the wall of the aortic arch. The bony thorax exhibits no acute abnormality. IMPRESSION: COPD. Chronic pleuro parenchymal changes especially at the left lung base. No CHF or alveolar pneumonia. Previous CABG. If the patient has dyspnea persists, chest CT scanning would be a useful next imaging step. Thoracic aortic atherosclerosis. Electronically Signed   By: David  Martinique M.D.   On: 01/23/2016 07:31    Assessment/Plan  1 acute on chronic combined systolic/diastolic congestive heart failure-patient with mild congestive heart failure symptoms on admission. They appear to be improving. Would continue Lasix 40 mg IV daily and transition to oral Lasix tomorrow if patient continues to improve. Repeat echocardiogram. Follow renal function closely.  2 atypical atrial flutter-patient appeared to be in atrial flutter on admission but has now converted to sinus rhythm. Recent TSH normal. Awake follow-up echocardiogram. I will increase metoprolol 50 mg daily for improved rate control if atrial arrhythmias recur. CHADSvasc 6. She would benefit from anticoagulation long-term. However she was recently discharged following admission for GI bleed. She has residual diverticula and angiodysplastic lesions. I am therefore hesitant to add anticoagulation at this point. Note her Plavix was discontinued recently because it was felt to contribute to bleeding. Continue aspirin. Patient understands there is a higher risk of stroke off of anticoagulation but I feel risk outweighs benefit.  3 acute on chronic stage III kidney disease-follow renal function closely with diuresis.  4 Coronary artery disease-continue aspirin and statin.   5 Hypertension-blood pressure elevated. Increase metoprolol as outlined and follow.   Kirk Ruths MD 01/24/2016, 6:53 AM

## 2016-01-25 DIAGNOSIS — I5023 Acute on chronic systolic (congestive) heart failure: Secondary | ICD-10-CM

## 2016-01-25 LAB — CBC
HCT: 34.9 % — ABNORMAL LOW (ref 36.0–46.0)
Hemoglobin: 10.9 g/dL — ABNORMAL LOW (ref 12.0–15.0)
MCH: 27.9 pg (ref 26.0–34.0)
MCHC: 31.2 g/dL (ref 30.0–36.0)
MCV: 89.5 fL (ref 78.0–100.0)
PLATELETS: 254 10*3/uL (ref 150–400)
RBC: 3.9 MIL/uL (ref 3.87–5.11)
RDW: 14.8 % (ref 11.5–15.5)
WBC: 6 10*3/uL (ref 4.0–10.5)

## 2016-01-25 LAB — BASIC METABOLIC PANEL
Anion gap: 9 (ref 5–15)
BUN: 48 mg/dL — AB (ref 6–20)
CALCIUM: 8.9 mg/dL (ref 8.9–10.3)
CHLORIDE: 100 mmol/L — AB (ref 101–111)
CO2: 29 mmol/L (ref 22–32)
CREATININE: 2.1 mg/dL — AB (ref 0.44–1.00)
GFR calc Af Amer: 26 mL/min — ABNORMAL LOW (ref >60)
GFR, EST NON AFRICAN AMERICAN: 22 mL/min — AB (ref >60)
Glucose, Bld: 90 mg/dL (ref 65–99)
Potassium: 4.4 mmol/L (ref 3.5–5.1)
SODIUM: 138 mmol/L (ref 135–145)

## 2016-01-25 LAB — HEMOGLOBIN A1C
HEMOGLOBIN A1C: 6 % — AB (ref 4.8–5.6)
MEAN PLASMA GLUCOSE: 126 mg/dL

## 2016-01-25 LAB — GLUCOSE, CAPILLARY
GLUCOSE-CAPILLARY: 136 mg/dL — AB (ref 65–99)
Glucose-Capillary: 148 mg/dL — ABNORMAL HIGH (ref 65–99)
Glucose-Capillary: 97 mg/dL (ref 65–99)
Glucose-Capillary: 82 mg/dL (ref 65–99)
Glucose-Capillary: 125 mg/dL — ABNORMAL HIGH (ref 65–99)

## 2016-01-25 MED ORDER — ISOSORBIDE MONONITRATE ER 30 MG PO TB24
30.0000 mg | ORAL_TABLET | Freq: Every day | ORAL | Status: DC
  Administered 2016-01-25 – 2016-01-26 (×2): 30 mg via ORAL
  Filled 2016-01-25 (×2): qty 1

## 2016-01-25 MED ORDER — HYDRALAZINE HCL 10 MG PO TABS
10.0000 mg | ORAL_TABLET | Freq: Three times a day (TID) | ORAL | Status: DC
  Administered 2016-01-25 – 2016-01-26 (×5): 10 mg via ORAL
  Filled 2016-01-25 (×5): qty 1

## 2016-01-25 MED ORDER — FUROSEMIDE 20 MG PO TABS
20.0000 mg | ORAL_TABLET | Freq: Every day | ORAL | Status: DC
  Administered 2016-01-25: 20 mg via ORAL
  Filled 2016-01-25: qty 1

## 2016-01-25 NOTE — Progress Notes (Signed)
Nutrition Brief Note  Patient identified on the Malnutrition Screening Tool (MST) Report  Patient's weight has remained stable over the past 6 months per chart. Pt eating 85-90% of meals at this time.  Wt Readings from Last 15 Encounters:  01/25/16 138 lb 6.4 oz (62.8 kg)  01/22/16 146 lb (66.2 kg)  01/13/16 141 lb (64 kg)  12/02/15 141 lb 3.2 oz (64 kg)  11/26/15 139 lb 12.8 oz (63.4 kg)  11/12/15 139 lb 3 oz (63.1 kg)  11/03/15 135 lb (61.2 kg)  10/23/15 132 lb 1.6 oz (59.9 kg)  10/08/15 138 lb (62.6 kg)  10/01/15 134 lb (60.8 kg)  09/08/15 129 lb 9.6 oz (58.8 kg)  08/28/15 137 lb (62.1 kg)  08/01/15 137 lb (62.1 kg)  07/03/15 148 lb (67.1 kg)  04/04/15 152 lb 6.4 oz (69.1 kg)    Body mass index is 22.34 kg/m. Patient meets criteria for normal range based on current BMI.   Current diet order is Heart Health/CHO modified, patient is consuming approximately 85-90% of meals at this time. Labs and medications reviewed.   No nutrition interventions warranted at this time. If nutrition issues arise, please consult RD.   Clayton Bibles, MS, RD, LDN Pager: [phone] After Hours Pager: [phone]

## 2016-01-25 NOTE — Progress Notes (Signed)
Patient Name: [name] [name] Date of Encounter: 01/25/2016  Primary Cardiologist: Dr [facility] Problem List     Principal Problem:   Acute on chronic systolic CHF (congestive heart failure) (HCC) Active Problems:   Diabetes mellitus, type II (HCC)   Acute CHF (congestive heart failure) (HCC)     Subjective   Dyspnea improving; no chest pain  Inpatient Medications    Scheduled Meds: . aspirin  81 mg Oral Daily  . calcium-vitamin D  1 tablet Oral q morning - 10a  . enoxaparin (LOVENOX) injection  30 mg Subcutaneous Q24H  . ferrous sulfate  325 mg Oral QPM  . furosemide  40 mg Intravenous Daily  . insulin aspart  0-5 Units Subcutaneous QHS  . insulin aspart  0-9 Units Subcutaneous TID WC  . insulin detemir  18 Units Subcutaneous QHS  . latanoprost  1 drop Both Eyes QHS  . levothyroxine  125 mcg Oral QAC breakfast  . methocarbamol  500 mg Oral TID  . metoprolol succinate  25 mg Oral Daily  . pantoprazole  40 mg Oral Daily  . rosuvastatin  20 mg Oral Daily  . sodium chloride flush  3 mL Intravenous Q12H  . vitamin C  500 mg Oral QHS   Continuous Infusions:  PRN Meds: sodium chloride, acetaminophen, albuterol, gabapentin, HYDROcodone-acetaminophen, ondansetron (ZOFRAN) IV, polyethylene glycol, sodium chloride flush   Vital Signs    Vitals:   01/24/16 1338 01/24/16 2131 01/25/16 0500 01/25/16 0645  BP: (!) 152/57 (!) 157/63  (!) 150/61  Pulse: 63 66  64  Resp: 20 20  20   Temp: 97.7 F (36.5 C) 98.2 F (36.8 C)  98.1 F (36.7 C)  TempSrc: Oral Oral  Oral  SpO2: 98% 97%  98%  Weight:   138 lb 6.4 oz (62.8 kg)   Height:        Intake/Output Summary (Last 24 hours) at 01/25/16 0715 Last data filed at 01/25/16 0645  Gross per 24 hour  Intake              600 ml  Output             3375 ml  Net            -2775 ml   Filed Weights   01/23/16 1642 01/24/16 0624 01/25/16 0500  Weight: 143 lb (64.9 kg) 140 lb 4.8 oz (63.6 kg) 138 lb 6.4 oz (62.8 kg)     Physical Exam   GEN: Well nourished, well developed, in no acute distress.  HEENT: Grossly normal.  Neck: Supple Cardiac: RRR Respiratory:  CTA GI: Soft, nontender, nondistended Skin: warm and dry, no rash. Neuro:  Grossly intact. Psych: AAOx3.  Normal affect.  Labs    CBC  Recent Labs  01/23/16 1413 01/25/16 0452  WBC 6.8 6.0  NEUTROABS 5.6  --   HGB 10.8* 10.9*  HCT 34.7* 34.9*  MCV 90.6 89.5  PLT 249 703   Basic Metabolic Panel  Recent Labs  01/24/16 0443 01/25/16 0452  NA 140 138  K 4.4 4.4  CL 104 100*  CO2 27 29  GLUCOSE 48* 90  BUN 31* 48*  CREATININE 2.05* 2.10*  CALCIUM 8.8* 8.9   Liver Function Tests  Recent Labs  01/22/16 1456 01/23/16 1413  AST 14 17  ALT 8 10*  ALKPHOS 67 69  BILITOT 0.4 0.7  PROT 6.8 7.0  ALBUMIN 3.6 3.5   Cardiac Enzymes  Recent Labs  01/23/16  1653 01/23/16 2217 01/24/16 0443  TROPONINI 0.03* 0.04* 0.03*   BNP Invalid input(s): POCBNP D-Dimer  Recent Labs  01/23/16 1413  DDIMER 1.63*     Telemetry    Sinus with PVC - Personally Reviewed  Radiology    Dg Chest 2 View  Result Date: 01/23/2016 CLINICAL DATA:  Shortness of breath. EXAM: CHEST  2 VIEW COMPARISON:  Radiographs of January 22, 2016. FINDINGS: Stable cardiomediastinal silhouette. No pneumothorax or pleural effusion is noted. Status post coronary artery bypass graft. Stable scarring and pleural thickening is noted in left lung base. Right lung is clear. No acute abnormality seen. Bony thorax is unremarkable. IMPRESSION: Stable left basilar scarring and pleural thickening. No acute cardiopulmonary abnormality seen. Electronically Signed   By: Marijo Conception, M.D.   On: 01/23/2016 14:17      Patient Profile     75 year old female with past medical history of coronary artery disease status post coronary artery bypass graft, ischemic cardiomyopathy, recent GI bleed diabetes mellitus, hypertension and hyperlipidemia admitted with acute  on chronic combined systolic/diastolic congestive heart failure and atypical flutter. Patient converted to sinus rhythm. Follow-up echocardiogram shows newly reduced LV function.   Assessment & Plan    1 acute on chronic combined systolic/diastolic congestive heart failure-patient improving. Would change lasix to 20 mg daily. Follow renal function closely. Echo shows EF 20-25; grade 2 DD; mild MR; moderate LAE.  2 atypical atrial flutter-patient appeared to be in atrial flutter on admission but converted to sinus rhythm. I will continue metoprolol for rate control if atrial arrhythmias recur. CHADSvasc 6. She would benefit from anticoagulation long-term. However she was recently discharged following admission for GI bleed. She has residual diverticula and angiodysplastic lesions. I am therefore hesitant to add anticoagulation at this point. Note her Plavix was discontinued recently because it was felt to contribute to bleeding. Continue aspirin. Patient understands there is a higher risk of stroke off of anticoagulation but I feel risk outweighs benefit.  3 acute on chronic stage III kidney disease-Renal function worse today likely related to diuresis; change lasix to 20 mg daily and follow  4 Coronary artery disease-continue aspirin and statin.   5 Hypertension-blood pressure elevated. Add hydralazine/nitrates.  6 Cardiomyopathy-LV function is newly reduced. Etiology unclear. She has not been having chest pain. She was in atrial flutter with rapid ventricular response. Question tachycardia mediated. Continue Toprol. Add hydralazine/nitrates. No ACE inhibitor given renal insufficiency. We can arrange an outpatient nuclear study for risk stratification following discharge. However she would be high risk for contrast nephropathy if catheterization required. Echocardiogram can be repeated 3 months after medications fully titrated to see if LV function improves.  Probable discharge tomorrow morning  if stable and follow-up with Dr. Gwenlyn Found.   Signed, Kirk Ruths, MD  01/25/2016, 7:15 AM

## 2016-01-25 NOTE — Progress Notes (Signed)
PROGRESS NOTE  TAWONDA LEGASPI  VHQ:[id number] DOB: Jun 07, 1940 DOA: 01/23/2016 PCP: [name], MD  Outpatient Specialists: Cardiology: Dr. Gwenlyn Found  Brief Narrative: Patient is a 75 year old female with history of CAD s/p CABG 1997, HTN, HLD, hypothyroidism, U1LK, and diastolic CHF presented 44/01 for dyspnea on exertion. Also having orthopnea and mild pedal edema. The prior day she was noted to be hypoxemic with crackles on exam by PCP. CXR ordered by PCP appeared normal. On arrival to the ED she was hypoxemic, and admitted for further work up. IV diuresis was started and cardiology was consulted for acute CHF as well as atrial flutter/AFib at admission which has since resolved. Echocardiogram revealed new combined CHF. Diuresis has been effective.   Assessment & Plan: Principal Problem:   Acute on chronic systolic CHF (congestive heart failure) (HCC) Active Problems:   Diabetes mellitus, type II (HCC)   Acute CHF (congestive heart failure) ([place])  Acute hypoxemic respiratory failure: Resolved. - Treat CHF as below. - DDimer checked at admission and is elevated: No V/Q scan planned due to resolution of symptoms with diuresis.  Acute on chronic systolic and diastolic CHF: Echocardiogram 11/18 showed EF 20-25%; grade 2 DD; mild MR; moderate LAE. - Sardiology recommendations appreciated: Transitioning to lasix 20mg , anticipate discharge in AM if stable.  - No symptoms to suggest ischemic etiology, possibly tachycardia-mediated with AFlutter w/RVR on admission. - Work up to continue as outpatient with Dr. Gwenlyn Found. Would be at risk for contrast nephropathy with cath, consider nuclear stress test.  - Strict I/O, daily wt   Atypical atrial flutter: Resolved, now NSR. No prior history of atrial fibrillation per patient. Last TSH wnl. - Increased metoprolol to 50mg  daily per cardiology. HR 58 this AM.  CAD s/p CABG 1997: Nuclear study 2013 without ischemia.  - Plavix stopped due to h/o GI bleed.   - Continue ASA, statin, beta blocker  Angiodysplasia s/p GI bleed: Thought to be at continued high risk for GI bleeding with anticoagulation. Patient aware of risks and benefits of anticoagulation and agrees with holding. - Continue ASA - Monitor CBC, signs of bleeding  History of T2DM: Last HbA1c was recently 5.4% - Placed on sliding scale insulin  Hypertension - Continue Toprol-XL at 50mg  daily  Hyperlipidemia - Continue Crestor   Hypothyroidism: TSH 4.33 recently, will not recheck at this time.  -Continue Synthroid    DVT prophylaxis: lovenox Code Status: Full Family Communication: None at bedside. Disposition Plan: Discharge to home once acute exacerbation of CHF resolved.  Consultants:   Cardiology, Dr. Stanford Breed  Procedures:   Echocardiogram 01/24/2016:  Study Conclusions  - Left ventricle: The cavity size was normal. Wall thickness was   normal. Systolic function was severely reduced. The estimated   ejection fraction was in the range of 20% to 25%. Wall motion was   normal; there were no regional wall motion abnormalities.   Features are consistent with a pseudonormal left ventricular   filling pattern, with concomitant abnormal relaxation and   increased filling pressure (grade 2 diastolic dysfunction).   Doppler parameters are consistent with high ventricular filling   pressure. - Mitral valve: Calcified annulus. There was mild regurgitation. - Left atrium: The atrium was moderately dilated.  Impressions:  - Severe global reduction in LV systolic function; grade 2   diastolic dysfunction with elevated LV filling pressure; mild MR;   moderate LAE.  Antimicrobials:  None   Subjective: Patient feels improved from admission, no chest pain, no dyspnea. Completely independent at home.  Objective: Vitals:   01/24/16 2131 01/25/16 0500 01/25/16 0645 01/25/16 1528  BP: (!) 157/63  (!) 150/61 (!) 107/55  Pulse: 66  64 64  Resp: 20  20 19   Temp:  98.2 F (36.8 C)  98.1 F (36.7 C) 98.1 F (36.7 C)  TempSrc: Oral  Oral Oral  SpO2: 97%  98% 97%  Weight:  62.8 kg (138 lb 6.4 oz)    Height:        Intake/Output Summary (Last 24 hours) at 01/25/16 1538 Last data filed at 01/25/16 1528  Gross per 24 hour  Intake              480 ml  Output             2652 ml  Net            -2172 ml   Filed Weights   01/23/16 1642 01/24/16 0624 01/25/16 0500  Weight: 64.9 kg (143 lb) 63.6 kg (140 lb 4.8 oz) 62.8 kg (138 lb 6.4 oz)    Examination: General exam: 75 y.o. female in no distress  Respiratory system: Non-labored breathing without O2. No crackles or wheezes. Cardiovascular system: Regular rate and rhythm. No murmur, rub, or gallop. No JVD, and no pedal edema. Gastrointestinal system: Abdomen soft, non-tender, non-distended, with normoactive bowel sounds. No organomegaly or masses felt. Central nervous system: Alert and oriented. No focal neurological deficits. Extremities: Warm, no deformities Skin: No rashes, lesions no ulcers Psychiatry: Judgement and insight appear normal. Mood & affect appropriate.   Data Reviewed: I have personally reviewed following labs and imaging studies  CBC:  Recent Labs Lab 01/22/16 1456 01/23/16 1413 01/25/16 0452  WBC 7.9 6.8 6.0  NEUTROABS  --  5.6  --   HGB 10.7* 10.8* 10.9*  HCT 33.0* 34.7* 34.9*  MCV 86.1 90.6 89.5  PLT 261.0 249 676   Basic Metabolic Panel:  Recent Labs Lab 01/22/16 1456 01/23/16 1413 01/24/16 0443 01/25/16 0452  NA 140 140 140 138  K 4.6 4.7 4.4 4.4  CL 106 107 104 100*  CO2 28 24 27 29   GLUCOSE 149* 141* 48* 90  BUN 25* 27* 31* 48*  CREATININE 1.57* 1.56* 2.05* 2.10*  CALCIUM 9.3 9.1 8.8* 8.9   GFR: Estimated Creatinine Clearance: 22 mL/min (by C-G formula based on SCr of 2.1 mg/dL (H)). Liver Function Tests:  Recent Labs Lab 01/22/16 1456 01/23/16 1413  AST 14 17  ALT 8 10*  ALKPHOS 67 69  BILITOT 0.4 0.7  PROT 6.8 7.0  ALBUMIN 3.6 3.5    No results for input(s): LIPASE, AMYLASE in the last 168 hours. No results for input(s): AMMONIA in the last 168 hours. Coagulation Profile: No results for input(s): INR, PROTIME in the last 168 hours. Cardiac Enzymes:  Recent Labs Lab 01/23/16 1413 01/23/16 1653 01/23/16 2217 01/24/16 0443  TROPONINI <0.03 0.03* 0.04* 0.03*   BNP (last 3 results) No results for input(s): PROBNP in the last 8760 hours. HbA1C:  Recent Labs  01/23/16 2217  HGBA1C 6.0*   CBG:  Recent Labs Lab 01/24/16 1132 01/24/16 1621 01/24/16 2134 01/25/16 0737 01/25/16 1130  GLUCAP 112* 143* 148* 97 136*   Lipid Profile: No results for input(s): CHOL, HDL, LDLCALC, TRIG, CHOLHDL, LDLDIRECT in the last 72 hours. Thyroid Function Tests: No results for input(s): TSH, T4TOTAL, FREET4, T3FREE, THYROIDAB in the last 72 hours. Anemia Panel: No results for input(s): VITAMINB12, FOLATE, FERRITIN, TIBC, IRON, RETICCTPCT in the last 72 hours.  Urine analysis:    Component Value Date/Time   COLORURINE YELLOW 10/01/2015 1630   APPEARANCEUR CLEAR 10/01/2015 1630   LABSPEC 1.020 10/01/2015 1630   PHURINE 5.5 10/01/2015 1630   GLUCOSEU NEGATIVE 10/01/2015 1630   HGBUR LARGE (A) 10/01/2015 1630   BILIRUBINUR NEGATIVE 10/01/2015 1630   KETONESUR NEGATIVE 10/01/2015 1630   PROTEINUR >300 (A) 08/28/2015 1812   UROBILINOGEN 0.2 10/01/2015 1630   NITRITE NEGATIVE 10/01/2015 1630   LEUKOCYTESUR LARGE (A) 10/01/2015 1630   Sepsis Labs: @LABRCNTIP (procalcitonin:4,lacticidven:4)  )No results found for this or any previous visit (from the past 240 hour(s)).   Radiology Studies: No results found.  Scheduled Meds: . aspirin  81 mg Oral Daily  . calcium-vitamin D  1 tablet Oral q morning - 10a  . enoxaparin (LOVENOX) injection  30 mg Subcutaneous Q24H  . ferrous sulfate  325 mg Oral QPM  . furosemide  20 mg Oral Daily  . hydrALAZINE  10 mg Oral Q8H  . insulin aspart  0-5 Units Subcutaneous QHS  . insulin  aspart  0-9 Units Subcutaneous TID WC  . insulin detemir  18 Units Subcutaneous QHS  . isosorbide mononitrate  30 mg Oral Daily  . latanoprost  1 drop Both Eyes QHS  . levothyroxine  125 mcg Oral QAC breakfast  . methocarbamol  500 mg Oral TID  . metoprolol succinate  25 mg Oral Daily  . pantoprazole  40 mg Oral Daily  . rosuvastatin  20 mg Oral Daily  . sodium chloride flush  3 mL Intravenous Q12H  . vitamin C  500 mg Oral QHS   Continuous Infusions:   LOS: 2 days   Time spent: 25 minutes.  Vance Gather, MD [facility] Pager [phone]  If 7PM-7AM, please contact night-coverage [email or web address] Password [facility] 01/25/2016, 3:38 PM

## 2016-01-26 DIAGNOSIS — K5520 Angiodysplasia of colon without hemorrhage: Secondary | ICD-10-CM

## 2016-01-26 DIAGNOSIS — N179 Acute kidney failure, unspecified: Secondary | ICD-10-CM

## 2016-01-26 DIAGNOSIS — I5043 Acute on chronic combined systolic (congestive) and diastolic (congestive) heart failure: Secondary | ICD-10-CM

## 2016-01-26 DIAGNOSIS — Z951 Presence of aortocoronary bypass graft: Secondary | ICD-10-CM

## 2016-01-26 DIAGNOSIS — I739 Peripheral vascular disease, unspecified: Secondary | ICD-10-CM

## 2016-01-26 DIAGNOSIS — I480 Paroxysmal atrial fibrillation: Secondary | ICD-10-CM

## 2016-01-26 DIAGNOSIS — I2510 Atherosclerotic heart disease of native coronary artery without angina pectoris: Secondary | ICD-10-CM

## 2016-01-26 DIAGNOSIS — N189 Chronic kidney disease, unspecified: Secondary | ICD-10-CM

## 2016-01-26 DIAGNOSIS — I4892 Unspecified atrial flutter: Secondary | ICD-10-CM

## 2016-01-26 LAB — BASIC METABOLIC PANEL
ANION GAP: 8 (ref 5–15)
BUN: 60 mg/dL — ABNORMAL HIGH (ref 6–20)
CHLORIDE: 103 mmol/L (ref 101–111)
CO2: 29 mmol/L (ref 22–32)
Calcium: 8.7 mg/dL — ABNORMAL LOW (ref 8.9–10.3)
Creatinine, Ser: 2.38 mg/dL — ABNORMAL HIGH (ref 0.44–1.00)
GFR calc Af Amer: 22 mL/min — ABNORMAL LOW (ref >60)
GFR, EST NON AFRICAN AMERICAN: 19 mL/min — AB (ref >60)
GLUCOSE: 87 mg/dL (ref 65–99)
POTASSIUM: 4.7 mmol/L (ref 3.5–5.1)
SODIUM: 140 mmol/L (ref 135–145)

## 2016-01-26 LAB — GLUCOSE, CAPILLARY
GLUCOSE-CAPILLARY: 90 mg/dL (ref 65–99)
GLUCOSE-CAPILLARY: 122 mg/dL — AB (ref 65–99)

## 2016-01-26 MED ORDER — ISOSORBIDE MONONITRATE ER 30 MG PO TB24: 30.0000 mg | ORAL_TABLET | Freq: Every day | ORAL | 0 refills | Status: DC

## 2016-01-26 MED ORDER — ATORVASTATIN CALCIUM 40 MG PO TABS: 40.0000 mg | ORAL_TABLET | Freq: Every day | ORAL | Status: DC

## 2016-01-26 MED ORDER — HYDRALAZINE HCL 10 MG PO TABS: 10.0000 mg | ORAL_TABLET | Freq: Three times a day (TID) | ORAL | 0 refills | Status: DC

## 2016-01-26 MED ORDER — METOPROLOL SUCCINATE ER 25 MG PO TB24: 25.0000 mg | ORAL_TABLET | Freq: Every day | ORAL | 0 refills | Status: DC

## 2016-01-26 MED ORDER — ROSUVASTATIN CALCIUM 20 MG PO TABS: 10.0000 mg | ORAL_TABLET | Freq: Every day | ORAL | 0 refills | Status: DC

## 2016-01-26 NOTE — Discharge Summary (Signed)
Physician Discharge Summary  [name] [name] EHU:[id number] DOB: [birth year] DOA: 01/23/2016  PCP: [name], MD  Admit date: 01/23/2016 Discharge date: 01/26/2016  Admitted From: Home Disposition: Home   Recommendations for Outpatient Follow-up:  1. Follow up with cardiology, Dr. Gwenlyn Found, in 1-2 weeks. Consider nuclear stress test given new systolic dysfunction.  2. Please obtain BMP to monitor renal function in one week, discharge creatinine 2.38. Put on lasix 20mg  daily and decreased crestor to 10mg  due to renal impairment 3. Follow up heart rhythm/rate, increased metoprolol succinate to 25mg  (from 12.5mg ) daily 4. Follow up heart failure/HTN: Started imdur, hydralazine. Discharge weight 139 lbs.   Home Health: None Equipment/Devices: None  Discharge Condition: Stable CODE STATUS: Full Diet recommendation: Heart healthy  Brief/Interim Summary: Patient is a 75 year old female with history of CAD s/p CABG 1997, HTN, HLD, hypothyroidism, Z8HY, and diastolic CHF presented 85/02 for dyspnea on exertion. Also having orthopnea and mild pedal edema. The prior day she was noted to be hypoxemic with crackles on exam by PCP. CXR ordered by PCP appeared normal. On arrival to the ED she was hypoxemic, and admitted for further work up. IV diuresis was started and cardiology was consulted for acute CHF as well as atrial flutter/AFib at admission which has since resolved. Echocardiogram revealed new combined CHF. Diuresis has been effective.   Discharge Diagnoses:  Principal Problem:   Acute on chronic combined systolic and diastolic CHF (congestive heart failure) (HCC) Active Problems:   CAD (coronary artery disease)   Diabetes mellitus, type II (HCC)   HTN (hypertension)   AAA (abdominal aortic aneurysm) (HCC)   GI bleed   Atypical atrial flutter (HCC)   Acute kidney injury superimposed on chronic kidney disease (HCC)   S/P CABG (coronary artery bypass graft)   PAD (peripheral artery  disease) (HCC)   Angiodysplasia of gastrointestinal tract   PAF (paroxysmal atrial fibrillation) ([place])  Acute hypoxemic respiratory failure: Resolved. - Treat CHF as below. - DDimer checked at admission and is elevated: No V/Q scan planned due to resolution of symptoms with diuresis.  Acute on chronic systolic and diastolic CHF: Echocardiogram 11/18 showed EF 20-25%; grade 2 DD; mild MR; moderate LAE. - Sardiology recommendations appreciated: Transitioning to lasix 20mg , anticipate discharge in AM if stable.  - No symptoms to suggest ischemic etiology, possibly tachycardia-mediated with AFlutter w/RVR on admission. - Work up to continue as outpatient with Dr. Gwenlyn Found. Would be at risk for contrast nephropathy with cath, consider nuclear stress test.  - Strict I/O, daily wt   Atypical atrial flutter: Resolved, now NSR. No prior history of atrial fibrillation per patient. Last TSH wnl. - Increased metoprolol to 50mg  daily per cardiology. HR 58 this AM.  CAD s/p CABG 1997: Nuclear study 2013 without ischemia.  - Plavix stopped due to h/o GI bleed.  - Continue ASA, statin, beta blocker  Angiodysplasia s/p GI bleed: Thought to be at continued high risk for GI bleeding with anticoagulation. Patient aware of risks and benefits of anticoagulation and agrees with holding. - Continue ASA - Monitor CBC, signs of bleeding  History of T2DM: Last HbA1c was recently 5.4% - Placed on sliding scale insulin  Hypertension - Continue Toprol-XL at 25mg  daily  Hyperlipidemia - Continue Crestor   Hypothyroidism: TSH 4.33 recently, will not recheck at this time.  -Continue Synthroid   Discharge Instructions Discharge Instructions    (HEART FAILURE PATIENTS) Call MD:  Anytime you have any of the following symptoms: 1) 3 pound weight gain in 24  hours or 5 pounds in 1 week 2) shortness of breath, with or without a dry hacking cough 3) swelling in the hands, feet or stomach 4) if you have to sleep  on extra pillows at night in order to breathe.    Complete by:  As directed    Call MD for:  difficulty breathing, headache or visual disturbances    Complete by:  As directed    Diet - low sodium heart healthy    Complete by:  As directed    Discharge instructions    Complete by:  As directed    - START taking hydralazine three times daily and isosorbide (imdur) once daily to help with heart failure. - INCREASE metoprolol to a full tablet (25mg ) once daily to help keep your heart in rhythm. - DECREASE crestor to half a tablet (10mg ) due to impaired kidney function. - KEEP taking lasix 20mg  daily - Follow up with your kidney doctor and Dr. Gwenlyn Found within the next 1 - 2 weeks.   Increase activity slowly    Complete by:  As directed        Medication List    TAKE these medications   aspirin 81 MG chewable tablet Chew 81 mg by mouth daily.   calcium-vitamin D 500-200 MG-UNIT tablet Commonly known as:  OSCAL WITH D Take 1 tablet by mouth every morning.   Cranberry 250 MG Tabs Take 1 tablet by mouth daily.   feeding supplement (GLUCERNA SHAKE) Liqd Take 237 mLs by mouth 3 (three) times daily between meals.   ferrous sulfate 325 (65 FE) MG tablet Take 325 mg by mouth every evening.   furosemide 20 MG tablet Commonly known as:  LASIX Take 1 tablet (20 mg total) by mouth daily.   gabapentin 300 MG capsule Commonly known as:  NEURONTIN Take 1 capsule (300 mg total) by mouth 3 (three) times daily as needed.   hydrALAZINE 10 MG tablet Commonly known as:  APRESOLINE Take 1 tablet (10 mg total) by mouth every 8 (eight) hours.   HYDROcodone-acetaminophen 5-325 MG tablet Commonly known as:  NORCO/VICODIN Take 1 tablet every 6 hours as needed for back pain   insulin aspart 100 UNIT/ML FlexPen Commonly known as:  NOVOLOG Inject 8 units three times a day with meals What changed:  how much to take  additional instructions   Insulin Detemir 100 UNIT/ML Pen Commonly known as:   LEVEMIR FLEXTOUCH Inject 22 units at bedtime. What changed:  how much to take  how to take this  when to take this  additional instructions   isosorbide mononitrate 30 MG 24 hr tablet Commonly known as:  IMDUR Take 1 tablet (30 mg total) by mouth daily. Start taking on:  01/27/2016   latanoprost 0.005 % ophthalmic solution Commonly known as:  XALATAN Place 1 drop into both eyes at bedtime.   levothyroxine 125 MCG tablet Commonly known as:  SYNTHROID, LEVOTHROID Take 125 mcg by mouth daily before breakfast.   methocarbamol 500 MG tablet Commonly known as:  ROBAXIN Take 1 tablet (500 mg total) by mouth 3 (three) times daily. As needed   metoprolol succinate 25 MG 24 hr tablet Commonly known as:  TOPROL-XL Take 1 tablet (25 mg total) by mouth daily. What changed:  See the new instructions.   omeprazole 20 MG capsule Commonly known as:  PRILOSEC TAKE 1 CAPSULE DAILY What changed:  See the new instructions.   Pen Needles 31G X 6 MM Misc Use to inject insulin  4 times per day.   polyethylene glycol packet Commonly known as:  MIRALAX / GLYCOLAX Take 17 g by mouth daily as needed for moderate constipation.   rosuvastatin 20 MG tablet Commonly known as:  CRESTOR Take 0.5 tablets (10 mg total) by mouth daily. What changed:  how much to take   VENTOLIN HFA 108 (90 Base) MCG/ACT inhaler Generic drug:  albuterol inhale 2 puffs by mouth INTO THE LUNGS every 6 hours if needed for wheezing   VITAMIN B 12 PO Take 1 tablet by mouth daily.   vitamin C 500 MG tablet Commonly known as:  ASCORBIC ACID Take 500 mg by mouth at bedtime.   Vitamin D (Ergocalciferol) 50000 units Caps capsule Commonly known as:  DRISDOL TAKE 1 CAPSULE BY MOUTH EVERY 7 DAYS ON [place], MD Follow up.   Specialty:  Endocrinology Contact information: [place] [address] [phone]        Quay Burow, MD.  Schedule an appointment as soon as possible for a visit in 1 week(s).   Specialties:  Cardiology, Radiology Contact information: [address] [place] [address] [phone]          No Known Allergies  Consultations:  Cardiology, [facility]  Procedures/Studies: Dg Chest 2 View  Result Date: 01/23/2016 CLINICAL DATA:  Shortness of breath. EXAM: CHEST  2 VIEW COMPARISON:  Radiographs of January 22, 2016. FINDINGS: Stable cardiomediastinal silhouette. No pneumothorax or pleural effusion is noted. Status post coronary artery bypass graft. Stable scarring and pleural thickening is noted in left lung base. Right lung is clear. No acute abnormality seen. Bony thorax is unremarkable. IMPRESSION: Stable left basilar scarring and pleural thickening. No acute cardiopulmonary abnormality seen. Electronically Signed   By: Marijo Conception, M.D.   On: 01/23/2016 14:17   Dg Chest 2 View  Result Date: 01/23/2016 CLINICAL DATA:  Increasing shortness of breath for the past week. History of CHF, hypertension, ischemic cardiomyopathy, and coronary artery disease status post CABG. EXAM: CHEST  2 VIEW COMPARISON:  Portable chest x-ray of October 18, 2015 FINDINGS: The lungs are hyperinflated. There is chronic blunting of the costophrenic angles. Increased density along the pleural surface at the left lung base is consistent with previous talc pleurodesis. There is no alveolar infiltrate. The heart is normal in size. The pulmonary vascularity is not engorged. There are post CABG changes. There is calcification in the wall of the aortic arch. The bony thorax exhibits no acute abnormality. IMPRESSION: COPD. Chronic pleuro parenchymal changes especially at the left lung base. No CHF or alveolar pneumonia. Previous CABG. If the patient has dyspnea persists, chest CT scanning would be a useful next imaging step. Thoracic aortic atherosclerosis. Electronically Signed   By: David  Martinique M.D.   On:  01/23/2016 07:31   Mm Digital Screening Bilateral  Result Date: 01/06/2016 CLINICAL DATA:  Screening. EXAM: DIGITAL SCREENING BILATERAL MAMMOGRAM WITH CAD COMPARISON:  Previous exam(s). ACR Breast Density Category b: There are scattered areas of fibroglandular density. FINDINGS: There are no findings suspicious for malignancy. Images were processed with CAD. IMPRESSION: No mammographic evidence of malignancy. A result letter of this screening mammogram will be mailed directly to the patient. RECOMMENDATION: Screening mammogram in one year. (Code:SM-B-01Y) BI-RADS CATEGORY  1: Negative. Electronically Signed   By: Dorise Bullion III M.D   On: 01/06/2016 14:17    Echocardiogram 01/24/2016:  Study Conclusions  -  Left ventricle: The cavity size was normal. Wall thickness was normal. Systolic function was severely reduced. The estimated ejection fraction was in the range of 20% to 25%. Wall motion was normal; there were no regional wall motion abnormalities. Features are consistent with a pseudonormal left ventricular filling pattern, with concomitant abnormal relaxation and increased filling pressure (grade 2 diastolic dysfunction). Doppler parameters are consistent with high ventricular filling pressure. - Mitral valve: Calcified annulus. There was mild regurgitation. - Left atrium: The atrium was moderately dilated.  Impressions:  - Severe global reduction in LV systolic function; grade 2 diastolic dysfunction with elevated LV filling pressure; mild MR; moderate LAE.  Subjective: Patient feels improved from admission, no chest pain, no dyspnea. Completely independent at home.  Discharge Exam: Vitals:   01/26/16 0436 01/26/16 1535  BP: (!) 132/48 (!) 144/65  Pulse: 64 62  Resp: 18   Temp: 98.3 F (36.8 C)    Vitals:   01/25/16 1528 01/25/16 2048 01/26/16 0436 01/26/16 1535  BP: (!) 107/55 (!) 146/55 (!) 132/48 (!) 144/65  Pulse: 64 66 64 62  Resp: 19  18 18    Temp: 98.1 F (36.7 C) 97.9 F (36.6 C) 98.3 F (36.8 C)   TempSrc: Oral Oral Oral   SpO2: 97% 97% 98%   Weight:   63.3 kg (139 lb 9.6 oz)   Height:       General: Pt is alert, awake, not in acute distress Cardiovascular: RRR, no murmur. No JVD. No edema Respiratory: Nonlabored, clear Abdominal: Soft, NT, ND, bowel sounds + Extremities: no edema, no cyanosis  The results of significant diagnostics from this hospitalization (including imaging, microbiology, ancillary and laboratory) are listed below for reference.    Labs: BNP (last 3 results)  Recent Labs  01/23/16 1413  BNP 4,431.5*   Basic Metabolic Panel:  Recent Labs Lab 01/22/16 1456 01/23/16 1413 01/24/16 0443 01/25/16 0452 01/26/16 0419  NA 140 140 140 138 140  K 4.6 4.7 4.4 4.4 4.7  CL 106 107 104 100* 103  CO2 28 24 27 29 29   GLUCOSE 149* 141* 48* 90 87  BUN 25* 27* 31* 48* 60*  CREATININE 1.57* 1.56* 2.05* 2.10* 2.38*  CALCIUM 9.3 9.1 8.8* 8.9 8.7*   Liver Function Tests:  Recent Labs Lab 01/22/16 1456 01/23/16 1413  AST 14 17  ALT 8 10*  ALKPHOS 67 69  BILITOT 0.4 0.7  PROT 6.8 7.0  ALBUMIN 3.6 3.5   CBC:  Recent Labs Lab 01/22/16 1456 01/23/16 1413 01/25/16 0452  WBC 7.9 6.8 6.0  NEUTROABS  --  5.6  --   HGB 10.7* 10.8* 10.9*  HCT 33.0* 34.7* 34.9*  MCV 86.1 90.6 89.5  PLT 261.0 249 254   Cardiac Enzymes:  Recent Labs Lab 01/23/16 1413 01/23/16 1653 01/23/16 2217 01/24/16 0443  TROPONINI <0.03 0.03* 0.04* 0.03*   CBG:  Recent Labs Lab 01/25/16 1130 01/25/16 1612 01/25/16 2043 01/26/16 0739 01/26/16 1151  GLUCAP 136* 82 125* 90 122*   D-Dimer No results for input(s): DDIMER in the last 72 hours. Hgb A1c  Recent Labs  01/23/16 2217  HGBA1C 6.0*   Urinalysis    Component Value Date/Time   COLORURINE YELLOW 10/01/2015 1630   APPEARANCEUR CLEAR 10/01/2015 1630   LABSPEC 1.020 10/01/2015 1630   PHURINE 5.5 10/01/2015 1630   GLUCOSEU NEGATIVE  10/01/2015 1630   HGBUR LARGE (A) 10/01/2015 1630   BILIRUBINUR NEGATIVE 10/01/2015 1630   KETONESUR NEGATIVE 10/01/2015 1630   PROTEINUR >  300 (A) 08/28/2015 1812   UROBILINOGEN 0.2 10/01/2015 1630   NITRITE NEGATIVE 10/01/2015 1630   LEUKOCYTESUR LARGE (A) 10/01/2015 1630   Time coordinating discharge: Over 30 minutes  Vance Gather, MD  [facility] 01/26/2016, 6:42 PM Pager [phone]  If 7PM-7AM, please contact night-coverage [email or web address] Password [email or web address]

## 2016-01-26 NOTE — Progress Notes (Signed)
Pt states that she will not need HH services at present time.

## 2016-01-26 NOTE — Progress Notes (Signed)
Patient Name: [name] [name] Date of Encounter: 01/26/2016  Primary Cardiologist: Dr. Serena Croissant Problem List     Principal Problem:   Acute on chronic combined systolic and diastolic CHF (congestive heart failure) (HCC) Active Problems:   CAD (coronary artery disease)   Diabetes mellitus, type II (HCC)   HTN (hypertension)   AAA (abdominal aortic aneurysm) (HCC)   GI bleed   Atypical atrial flutter (HCC)   Acute kidney injury superimposed on chronic kidney disease (HCC)   S/P CABG (coronary artery bypass graft)   PAD (peripheral artery disease) (HCC)   Angiodysplasia of gastrointestinal tract    Subjective   Feeling well this AM. Was unaware of any fluttering this AM or yesterday. No CP or SOB.  Inpatient Medications    . aspirin  81 mg Oral Daily  . atorvastatin  40 mg Oral q1800  . calcium-vitamin D  1 tablet Oral q morning - 10a  . enoxaparin (LOVENOX) injection  30 mg Subcutaneous Q24H  . ferrous sulfate  325 mg Oral QPM  . hydrALAZINE  10 mg Oral Q8H  . insulin aspart  0-5 Units Subcutaneous QHS  . insulin aspart  0-9 Units Subcutaneous TID WC  . insulin detemir  18 Units Subcutaneous QHS  . isosorbide mononitrate  30 mg Oral Daily  . latanoprost  1 drop Both Eyes QHS  . levothyroxine  125 mcg Oral QAC breakfast  . methocarbamol  500 mg Oral TID  . metoprolol succinate  25 mg Oral Daily  . pantoprazole  40 mg Oral Daily  . sodium chloride flush  3 mL Intravenous Q12H  . vitamin C  500 mg Oral QHS    Vital Signs    Vitals:   01/25/16 0645 01/25/16 1528 01/25/16 2048 01/26/16 0436  BP: (!) 150/61 (!) 107/55 (!) 146/55 (!) 132/48  Pulse: 64 64 66 64  Resp: 20 19 18 18   Temp: 98.1 F (36.7 C) 98.1 F (36.7 C) 97.9 F (36.6 C) 98.3 F (36.8 C)  TempSrc: Oral Oral Oral Oral  SpO2: 98% 97% 97% 98%  Weight:    139 lb 9.6 oz (63.3 kg)  Height:        Intake/Output Summary (Last 24 hours) at 01/26/16 0842 Last data filed at 01/26/16 0824  Gross per 24 hour  Intake              720 ml  Output             1852 ml  Net            -1132 ml   Filed Weights   01/24/16 0624 01/25/16 0500 01/26/16 0436  Weight: 140 lb 4.8 oz (63.6 kg) 138 lb 6.4 oz (62.8 kg) 139 lb 9.6 oz (63.3 kg)    Physical Exam    General: Well developed, well nourished pale WF in no acute distress. HEENT: Normocephalic, atraumatic, sclera non-icteric, no xanthomas, nares are without discharge. Neck: Negative for carotid bruits. JVP not elevated. Lungs: Clear bilaterally to auscultation without wheezes, rales, or rhonchi. Breathing is unlabored. Cardiac: RRR S1 S2 without murmurs, rubs, or gallops.  Abdomen: Soft, non-tender, non-distended with normoactive bowel sounds. No rebound/guarding. Extremities: No clubbing or cyanosis. No edema. Distal pedal pulses are 2+ and equal bilaterally. Skin: Warm and dry, no significant rash. Neuro: Alert and oriented X 3. Sensation in tact. Follows commands. Psych:  Responds to questions appropriately with a normal affect.  Labs    CBC  Recent Labs  01/23/16 1413 01/25/16 0452  WBC 6.8 6.0  NEUTROABS 5.6  --   HGB 10.8* 10.9*  HCT 34.7* 34.9*  MCV 90.6 89.5  PLT 249 846   Basic Metabolic Panel  Recent Labs  01/25/16 0452 01/26/16 0419  NA 138 140  K 4.4 4.7  CL 100* 103  CO2 29 29  GLUCOSE 90 87  BUN 48* 60*  CREATININE 2.10* 2.38*  CALCIUM 8.9 8.7*   Liver Function Tests  Recent Labs  01/23/16 1413  AST 17  ALT 10*  ALKPHOS 69  BILITOT 0.7  PROT 7.0  ALBUMIN 3.5   No results for input(s): LIPASE, AMYLASE in the last 72 hours. Cardiac Enzymes  Recent Labs  01/23/16 1653 01/23/16 2217 01/24/16 0443  TROPONINI 0.03* 0.04* 0.03*   BNP Invalid input(s): POCBNP D-Dimer  Recent Labs  01/23/16 1413  DDIMER 1.63*   Hemoglobin A1C  Recent Labs  01/23/16 2217  HGBA1C 6.0*     Telemetry    Yesterday late morning - brief run of what appears to be atrial fib This AM -  brief run of Afib vs flutter.  Radiology    Dg Chest 2 View  Result Date: 01/23/2016 CLINICAL DATA:  Shortness of breath. EXAM: CHEST  2 VIEW COMPARISON:  Radiographs of January 22, 2016. FINDINGS: Stable cardiomediastinal silhouette. No pneumothorax or pleural effusion is noted. Status post coronary artery bypass graft. Stable scarring and pleural thickening is noted in left lung base. Right lung is clear. No acute abnormality seen. Bony thorax is unremarkable. IMPRESSION: Stable left basilar scarring and pleural thickening. No acute cardiopulmonary abnormality seen. Electronically Signed   By: Marijo Conception, M.D.   On: 01/23/2016 14:17   Dg Chest 2 View  Result Date: 01/23/2016 CLINICAL DATA:  Increasing shortness of breath for the past week. History of CHF, hypertension, ischemic cardiomyopathy, and coronary artery disease status post CABG. EXAM: CHEST  2 VIEW COMPARISON:  Portable chest x-ray of October 18, 2015 FINDINGS: The lungs are hyperinflated. There is chronic blunting of the costophrenic angles. Increased density along the pleural surface at the left lung base is consistent with previous talc pleurodesis. There is no alveolar infiltrate. The heart is normal in size. The pulmonary vascularity is not engorged. There are post CABG changes. There is calcification in the wall of the aortic arch. The bony thorax exhibits no acute abnormality. IMPRESSION: COPD. Chronic pleuro parenchymal changes especially at the left lung base. No CHF or alveolar pneumonia. Previous CABG. If the patient has dyspnea persists, chest CT scanning would be a useful next imaging step. Thoracic aortic atherosclerosis. Electronically Signed   By: David  Martinique M.D.   On: 01/23/2016 07:31   Mm Digital Screening Bilateral  Result Date: 01/06/2016 CLINICAL DATA:  Screening. EXAM: DIGITAL SCREENING BILATERAL MAMMOGRAM WITH CAD COMPARISON:  Previous exam(s). ACR Breast Density Category b: There are scattered areas of  fibroglandular density. FINDINGS: There are no findings suspicious for malignancy. Images were processed with CAD. IMPRESSION: No mammographic evidence of malignancy. A result letter of this screening mammogram will be mailed directly to the patient. RECOMMENDATION: Screening mammogram in one year. (Code:SM-B-01Y) BI-RADS CATEGORY  1: Negative. 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 2D Echo 01/24/16: EF 20-25%, grade 2 DD, high vent filling pressure, mild MR, mod LAE (55% in 08/2015).  Assessment & Plan    1. Acute on chronic combined CHF with new cardiomyopathy - weight is now back near baseline. Cr has risen further. Will hold diuretic today. Consider gentle IV hydration. Etiology of cardiomyopathy unclear, possibly tachy-mediated but could also be related to progressive CAD. Unable to proceed with invasive ischemic eval given her recent GIB. Would manage rhythm and reass LVF down the road. No ACEI/ARB with AKI. Continue Imdur/hydralazine.  2. Atypical atrial flutter/also possible atrial fib on 11/19 - in general, has maintained NSR except for brief occasional breakthrough runs. CHADSvasc 6. She would benefit from anticoagulation long-term from a stroke perspective, but was recently discharged following admission for GI bleed and asked to remain off Plavix for this reason. She has residual diverticula and angiodysplastic lesions. Therefore, anticoagulation beyond ASA has not been initiated this  admission. Will review with Dr. Gwenlyn Found whether there would be utility in placing her on amiodarone for maintenance of NSR since she cannot be anticoagulated. I am not sure given her GI status that she would be a Watchman candidate but this is something to consider depending on future clinical course. Check thyroid function with next labs for completeness.  3. AKI on CKD stage III [baseline 1.4-1.8] - as above. Continued rise in Cr.  4. CAD - continue ASA, BB, statin, Imdur. No anginal sx.   5. Hypertension - follow off diuretic.  Signed, Charlie Pitter, PA-C  01/26/2016, 8:42 AM   Agree with findings by Melina Copa PA-C  Good diuresis. Breathing back to baseline. Scr elevated . Etiology  of reduced EF still not clear. On approp meds. She prob should go home on low dose lasix PO  (20 mg). Can re check 2 D as OP. She is in NSR now. Agree with  No NOAC given recent GIB. She appears dry on exam. Lungs clear . Cor RRR. No periph edema. No Amio for now either. Will need TOC 7 with one of our MLPs then ROV with me 6-8 weeks.   [name], M.D., [place], [facility], [place] [address] [employer], [place]  [address]  [phone] 01/26/2016 12:06 PM

## 2016-02-02 ENCOUNTER — Other Ambulatory Visit (INDEPENDENT_AMBULATORY_CARE_PROVIDER_SITE_OTHER): Payer: Medicare Other

## 2016-02-02 DIAGNOSIS — E782 Mixed hyperlipidemia: Secondary | ICD-10-CM

## 2016-02-02 DIAGNOSIS — E038 Other specified hypothyroidism: Secondary | ICD-10-CM

## 2016-02-02 DIAGNOSIS — R7989 Other specified abnormal findings of blood chemistry: Secondary | ICD-10-CM

## 2016-02-02 DIAGNOSIS — N183 Chronic kidney disease, stage 3 unspecified: Secondary | ICD-10-CM

## 2016-02-02 DIAGNOSIS — E063 Autoimmune thyroiditis: Secondary | ICD-10-CM

## 2016-02-02 DIAGNOSIS — E559 Vitamin D deficiency, unspecified: Secondary | ICD-10-CM

## 2016-02-02 LAB — LIPID PANEL
CHOL/HDL RATIO: 5
Cholesterol: 162 mg/dL (ref 0–200)
HDL: 30.8 mg/dL — AB (ref >39.00)
NONHDL: 131.21
TRIGLYCERIDES: 264 mg/dL — AB (ref 0.0–149.0)
VLDL: 52.8 mg/dL — ABNORMAL HIGH (ref 0.0–40.0)

## 2016-02-02 LAB — URINALYSIS, ROUTINE W REFLEX MICROSCOPIC
Bilirubin Urine: NEGATIVE
HGB URINE DIPSTICK: NEGATIVE
KETONES UR: NEGATIVE
NITRITE: NEGATIVE
SPECIFIC GRAVITY, URINE: 1.01 (ref 1.000–1.030)
TOTAL PROTEIN, URINE-UPE24: 30 — AB
URINE GLUCOSE: NEGATIVE
UROBILINOGEN UA: 0.2 (ref 0.0–1.0)
pH: 6 (ref 5.0–8.0)

## 2016-02-02 LAB — TSH: TSH: 2.04 u[IU]/mL (ref 0.35–4.50)

## 2016-02-02 LAB — MICROALBUMIN / CREATININE URINE RATIO
CREATININE, U: 62.8 mg/dL
Microalb Creat Ratio: 39.3 mg/g — ABNORMAL HIGH (ref 0.0–30.0)
Microalb, Ur: 24.7 mg/dL — ABNORMAL HIGH (ref 0.0–1.9)

## 2016-02-02 LAB — VITAMIN D 25 HYDROXY (VIT D DEFICIENCY, FRACTURES): VITD: 74.83 ng/mL (ref 30.00–100.00)

## 2016-02-02 LAB — LDL CHOLESTEROL, DIRECT: Direct LDL: 69 mg/dL

## 2016-02-02 LAB — T4, FREE: Free T4: 1.26 ng/dL (ref 0.60–1.60)

## 2016-02-04 ENCOUNTER — Ambulatory Visit (INDEPENDENT_AMBULATORY_CARE_PROVIDER_SITE_OTHER): Payer: Medicare Other | Admitting: Cardiology

## 2016-02-04 ENCOUNTER — Encounter: Payer: Self-pay | Admitting: Cardiology

## 2016-02-04 DIAGNOSIS — I484 Atypical atrial flutter: Secondary | ICD-10-CM | POA: Diagnosis not present

## 2016-02-04 DIAGNOSIS — Z951 Presence of aortocoronary bypass graft: Secondary | ICD-10-CM

## 2016-02-04 DIAGNOSIS — I5043 Acute on chronic combined systolic (congestive) and diastolic (congestive) heart failure: Secondary | ICD-10-CM

## 2016-02-04 DIAGNOSIS — N183 Chronic kidney disease, stage 3 unspecified: Secondary | ICD-10-CM

## 2016-02-04 DIAGNOSIS — K284 Chronic or unspecified gastrojejunal ulcer with hemorrhage: Secondary | ICD-10-CM

## 2016-02-04 NOTE — Assessment & Plan Note (Signed)
Low risk Myoview 2013

## 2016-02-04 NOTE — Patient Instructions (Signed)
Your physician recommends that you return for lab work in: prior to your next visit.  Your physician recommends that you schedule a follow-up appointment in: 6 weeks with Dr Gwenlyn Found.

## 2016-02-04 NOTE — Progress Notes (Signed)
02/04/2016 [name] [name]   [birth year]  [id number]  Primary Physician [name], MD Primary Cardiologist: Dr Gwenlyn Found  HPI:  75 year old female with past medical history of CAD, status post coronary artery bypass graft in 1997 with low risk Myoview in 2013, ischemic cardiomyopathy with an EF of 55% by echo in June 2017, recent GI bleed secondary to AVMs, diabetes mellitus, hypertension, CRI-3, and hyperlipidemia admitted 01/23/16 with acute congestive heart failure and atypical flutter. Patient converted to sinus rhythm with rate control. Follow-up echocardiogram shows newly reduced LV function of 20-25%. The pt's medications were adjusted. The plan is to repeat an echo in 3 months (newly depressed EF may be attributed to A flutter with RVR). Myoview was mentioned but she would be a high risk cath with her degree of renal disease, CRI3-4. She is in the office today for a TOC f/u. She denies any further unusual dyspnea. She says she never had palpitations. She is to see her urologist tomorrow to have a ureteral stent removed, she has only one functioning kidney.    Current Outpatient Prescriptions  Medication Sig Dispense Refill  . aspirin 81 MG chewable tablet Chew 81 mg by mouth daily.     . calcium-vitamin D (OSCAL WITH D) 500-200 MG-UNIT per tablet Take 1 tablet by mouth every morning.    . Cranberry 250 MG TABS Take 1 tablet by mouth daily.     . Cyanocobalamin (VITAMIN B 12 PO) Take 1 tablet by mouth daily.     . ferrous sulfate 325 (65 FE) MG tablet Take 325 mg by mouth every evening.     . furosemide (LASIX) 20 MG tablet Take 1 tablet (20 mg total) by mouth daily. 30 tablet 0  . gabapentin (NEURONTIN) 300 MG capsule Take 1 capsule (300 mg total) by mouth 3 (three) times daily as needed. 90 capsule 3  . hydrALAZINE (APRESOLINE) 10 MG tablet Take 1 tablet (10 mg total) by mouth every 8 (eight) hours. 90 tablet 0  . HYDROcodone-acetaminophen (NORCO/VICODIN) 5-325 MG tablet Take 1 tablet  every 6 hours as needed for back pain 60 tablet 0  . insulin aspart (NOVOLOG) 100 UNIT/ML FlexPen Inject 8 units three times a day with meals (Patient taking differently: 6 Units. Inject 8 units three times a day with meals) 15 mL 5  . Insulin Detemir (LEVEMIR FLEXTOUCH) 100 UNIT/ML Pen Inject 22 units at bedtime. (Patient taking differently: Inject 18 Units into the skin at bedtime. ) 30 mL 1  . Insulin Pen Needle (PEN NEEDLES) 31G X 6 MM MISC Use to inject insulin 4 times per day. 150 each 2  . isosorbide mononitrate (IMDUR) 30 MG 24 hr tablet Take 1 tablet (30 mg total) by mouth daily. 30 tablet 0  . latanoprost (XALATAN) 0.005 % ophthalmic solution Place 1 drop into both eyes at bedtime.  0  . levothyroxine (SYNTHROID, LEVOTHROID) 125 MCG tablet Take 125 mcg by mouth daily before breakfast.    . methocarbamol (ROBAXIN) 500 MG tablet Take 1 tablet (500 mg total) by mouth 3 (three) times daily. As needed 90 tablet 0  . metoprolol succinate (TOPROL-XL) 25 MG 24 hr tablet Take 1 tablet (25 mg total) by mouth daily. 30 tablet 0  . omeprazole (PRILOSEC) 20 MG capsule TAKE 1 CAPSULE DAILY (Patient taking differently: TAKE 1 CAPSULE DAILY--  takes in am) 90 capsule 3  . polyethylene glycol (MIRALAX / GLYCOLAX) packet Take 17 g by mouth daily as needed for moderate constipation. [place]  each 0  . rosuvastatin (CRESTOR) 20 MG tablet Take 0.5 tablets (10 mg total) by mouth daily. 30 tablet 0  . VENTOLIN HFA 108 (90 Base) MCG/ACT inhaler inhale 2 puffs by mouth INTO THE LUNGS every 6 hours if needed for wheezing 18 Inhaler 4  . vitamin C (ASCORBIC ACID) 500 MG tablet Take 500 mg by mouth at bedtime.     . Vitamin D, Ergocalciferol, (DRISDOL) 50000 units CAPS capsule TAKE 1 CAPSULE BY MOUTH EVERY 7 DAYS ON THURSDAYS 4 capsule 5   No current facility-administered medications for this visit.     No Known Allergies  Social History   Social History  . Marital status: Divorced    Spouse name: N/A  . Number of  children: 1  . Years of education: 15   Occupational History  . Not on file.   Social History Main Topics  . Smoking status: Former Smoker    Packs/day: 2.00    Years: 30.00    Quit date: 06/07/2009  . Smokeless tobacco: Never Used  . Alcohol use 1.8 oz/week    3 Standard drinks or equivalent per week     Comment: ocassional  . Drug use: No  . Sexual activity: Not on file   Other Topics Concern  . Not on file   Social History Narrative   Lives alone   Worked 3 yrs [employer] [place]   1 daughter     Review of Systems: General: negative for chills, fever, night sweats or weight changes.  Cardiovascular: negative for chest pain, dyspnea on exertion, edema, orthopnea, palpitations, paroxysmal nocturnal dyspnea or shortness of breath Dermatological: negative for rash Respiratory: negative for cough or wheezing Urologic: negative for hematuria Abdominal: negative for nausea, vomiting, diarrhea, bright red blood per rectum, melena, or hematemesis Neurologic: negative for visual changes, syncope, or dizziness All other systems reviewed and are otherwise negative except as noted above.    Blood pressure (!) 156/60, pulse 72, height 5\' 6"  (1.676 m), weight 140 lb (63.5 kg).  General appearance: alert, cooperative, appears older than stated age and no distress Neck: no JVD and bilat carotid bruits, RCE scar Lungs: clear to auscultation bilaterally and kyphosis Heart: regular rate and rhythm Extremities: extremities normal, atraumatic, no cyanosis or edema Skin: pale, cool, dry Neurologic: Grossly normal  EKG NSR, inferior lat TWI is old  ASSESSMENT AND PLAN:   Acute on chronic combined systolic and diastolic CHF  Discharged 97/35/32- new LVD by echo, new transient atrial flutter  Atypical atrial flutter ([place]) New onset 01/23/16- converted spontaneously  Chronic kidney disease, stage III (moderate) No ACE or ARB  Hx of CABG '97 Low risk Myoview 2013  GI  bleed Known AVMs- not a candidate for anticoagulation or Plavix   PLAN  Same Rx. See Dr Gwenlyn Found in 6 weeks check BMP and CBC before that visit. She'll need an echo February.   Kerin Ransom PA-C 02/04/2016 2:48 PM

## 2016-02-04 NOTE — Assessment & Plan Note (Signed)
New onset 01/23/16- converted spontaneously

## 2016-02-04 NOTE — Assessment & Plan Note (Signed)
Known AVMs- not a candidate for anticoagulation or Plavix

## 2016-02-04 NOTE — Assessment & Plan Note (Signed)
Discharged 01/26/16- new LVD by echo, new transient atrial flutter

## 2016-02-04 NOTE — Assessment & Plan Note (Signed)
No ACE or ARB

## 2016-02-05 ENCOUNTER — Ambulatory Visit: Payer: Medicare Other | Admitting: Endocrinology

## 2016-02-05 DIAGNOSIS — N135 Crossing vessel and stricture of ureter without hydronephrosis: Secondary | ICD-10-CM | POA: Diagnosis not present

## 2016-02-09 ENCOUNTER — Encounter: Payer: Self-pay | Admitting: Endocrinology

## 2016-02-09 ENCOUNTER — Ambulatory Visit (INDEPENDENT_AMBULATORY_CARE_PROVIDER_SITE_OTHER): Payer: Medicare Other | Admitting: Endocrinology

## 2016-02-09 VITALS — BP 92/48 | HR 60 | Temp 97.6°F | Wt 140.0 lb

## 2016-02-09 DIAGNOSIS — I5042 Chronic combined systolic (congestive) and diastolic (congestive) heart failure: Secondary | ICD-10-CM

## 2016-02-09 DIAGNOSIS — D649 Anemia, unspecified: Secondary | ICD-10-CM

## 2016-02-09 DIAGNOSIS — N184 Chronic kidney disease, stage 4 (severe): Secondary | ICD-10-CM | POA: Diagnosis not present

## 2016-02-09 DIAGNOSIS — E119 Type 2 diabetes mellitus without complications: Secondary | ICD-10-CM

## 2016-02-09 DIAGNOSIS — E559 Vitamin D deficiency, unspecified: Secondary | ICD-10-CM

## 2016-02-09 DIAGNOSIS — I6523 Occlusion and stenosis of bilateral carotid arteries: Secondary | ICD-10-CM

## 2016-02-09 LAB — COMPREHENSIVE METABOLIC PANEL
ALBUMIN: 3.5 g/dL (ref 3.5–5.2)
ALK PHOS: 62 U/L (ref 39–117)
ALT: 8 U/L (ref 0–35)
AST: 14 U/L (ref 0–37)
BUN: 57 mg/dL — ABNORMAL HIGH (ref 6–23)
CALCIUM: 8.8 mg/dL (ref 8.4–10.5)
CHLORIDE: 105 meq/L (ref 96–112)
CO2: 22 mEq/L (ref 19–32)
Creatinine, Ser: 2.13 mg/dL — ABNORMAL HIGH (ref 0.40–1.20)
GFR: 24.02 mL/min — AB (ref >60.00)
Glucose, Bld: 121 mg/dL — ABNORMAL HIGH (ref 70–99)
POTASSIUM: 4.2 meq/L (ref 3.5–5.1)
Sodium: 139 mEq/L (ref 135–145)
TOTAL PROTEIN: 6.7 g/dL (ref 6.0–8.3)
Total Bilirubin: 0.4 mg/dL (ref 0.2–1.2)

## 2016-02-09 LAB — CBC WITH DIFFERENTIAL/PLATELET
BASOS PCT: 0.3 % (ref 0.0–3.0)
Basophils Absolute: 0 10*3/uL (ref 0.0–0.1)
EOS PCT: 1.9 % (ref 0.0–5.0)
Eosinophils Absolute: 0.2 10*3/uL (ref 0.0–0.7)
HEMATOCRIT: 33.3 % — AB (ref 36.0–46.0)
HEMOGLOBIN: 10.7 g/dL — AB (ref 12.0–15.0)
LYMPHS PCT: 10.7 % — AB (ref 12.0–46.0)
Lymphs Abs: 1.1 10*3/uL (ref 0.7–4.0)
MCHC: 32.2 g/dL (ref 30.0–36.0)
MCV: 86 fl (ref 78.0–100.0)
MONO ABS: 0.4 10*3/uL (ref 0.1–1.0)
MONOS PCT: 4.3 % (ref 3.0–12.0)
Neutro Abs: 8.5 10*3/uL — ABNORMAL HIGH (ref 1.4–7.7)
Neutrophils Relative %: 82.8 % — ABNORMAL HIGH (ref 43.0–77.0)
Platelets: 227 10*3/uL (ref 150.0–400.0)
RBC: 3.87 Mil/uL (ref 3.87–5.11)
RDW: 15.8 % — AB (ref 11.5–15.5)
WBC: 10.3 10*3/uL (ref 4.0–10.5)

## 2016-02-09 LAB — BRAIN NATRIURETIC PEPTIDE: Pro B Natriuretic peptide (BNP): 119 pg/mL — ABNORMAL HIGH (ref 0.0–100.0)

## 2016-02-09 MED ORDER — HYDROCODONE-ACETAMINOPHEN 5-325 MG PO TABS: ORAL_TABLET | ORAL | 0 refills | Status: DC

## 2016-02-09 NOTE — Patient Instructions (Signed)
Vitamin D, 2x per month

## 2016-02-09 NOTE — Progress Notes (Signed)
Patient ID: [name] [name], female   DOB: 10-Jan-1941, 75 y.o.   MRN: [id number]   Reason for Appointment: Follow-up of various problems  History of Present Illness   ANEMIA:  This was worse when she had GI bleeding and Plavix was stopped, currently on 81 mg aspirin only Continues to be taking iron daily, no side effects reported Hemoglobin relatively stable  Lab Results  Component Value Date   WBC 6.0 01/25/2016   HGB 10.9 (L) 01/25/2016   HCT 34.9 (L) 01/25/2016   MCV 89.5 01/25/2016   PLT 254 01/25/2016     CHF: She was admitted with shortness of breath and orthopnea on 01/23/16 She has a low ejection fraction of 20-25 % and is now on hydralazine along with 20 mg Lasix Her BNP was over 1000 in the hospital Also she had atrial flutter when she had her echocardiogram done  She does not feel out of breath and can lie down and sleep  RENAL dysfunction:  Renal function is Relatively worse since her hospitalization for presumed CHF She was continued on 20 mg Lasix No recurrence of hyperkalemia   Lab Results  Component Value Date   CREATININE 2.38 (H) 01/26/2016   CREATININE 2.10 (H) 01/25/2016   CREATININE 2.05 (H) 01/24/2016   CREATININE 1.56 (H) 01/23/2016   Lab Results  Component Value Date   CREATININE 2.38 (H) 01/26/2016   BUN 60 (H) 01/26/2016   NA 140 01/26/2016   K 4.7 01/26/2016   CL 103 01/26/2016   CO2 29 01/26/2016      Type 2 DIABETES mellitus, date of diagnosis: 1986.   The insulin regimen is: Levemir 18 units hs, Novolog 6-6 ac twice a day   Type 2 diabetes  has been treated in the last few years with low dose basal bolus insulin regimen. She has not been taking  any oral hypoglycemic drugs including metformin partly because of GI side effects  A1c is fairly good at 6  She Has not brought her monitor, she thinks she has had a couple of sporadic high readings but possibly from forgetting Levemir Does not think her sugars are high  after her evening meal  Side effects from medications: Diarrhea from metformin and nausea and vomiting from GLP-1 drugs   Monitors blood glucose: Less than 1 times a day  Glucometer: One Touch.  Readings not available   Meals: she is usually eating low fat meals especially recently meals usually at 11 AM,and supper at 5 pm.  Calorie intake: Usually controlled. Moderate Carbs Physical activity: exercise: Unable to do any   Dietician visit: Most recent:, 5/13.   Wt Readings from Last 3 Encounters:  02/09/16 140 lb (63.5 kg)  02/04/16 140 lb (63.5 kg)  01/26/16 139 lb 9.6 oz (63.3 kg)    Lab Results  Component Value Date   HGBA1C 6.0 (H) 01/23/2016   HGBA1C 5.4 11/27/2015   HGBA1C 5.6 08/29/2015   Lab Results  Component Value Date   MICROALBUR 24.7 (H) 02/02/2016   LDLCALC 49 09/13/2013   CREATININE 2.38 (H) 01/26/2016     3.  HYPOTHYROIDISM: She has had long-standing primary hypothyroidism TSH has been low Normal  usually  She is taking Levothyroxine, 125 g dose  TSH is Back to normal   Lab Results  Component Value Date   TSH 2.04 02/02/2016   TSH 4.33 11/27/2015   TSH 0.496 08/29/2015   FREET4 1.26 02/02/2016   FREET4 0.91 11/27/2015  FREET4 1.09 06/30/2015    OTHER active problems: See review of systems      Medication List       Accurate as of 02/09/16 10:11 AM. Always use your most recent med list.          aspirin 81 MG chewable tablet Chew 81 mg by mouth daily.   calcium-vitamin D 500-200 MG-UNIT tablet Commonly known as:  OSCAL WITH D Take 1 tablet by mouth every morning.   Cranberry 250 MG Tabs Take 1 tablet by mouth daily.   ferrous sulfate 325 (65 FE) MG tablet Take 325 mg by mouth every evening.   furosemide 20 MG tablet Commonly known as:  LASIX Take 1 tablet (20 mg total) by mouth daily.   hydrALAZINE 10 MG tablet Commonly known as:  APRESOLINE Take 1 tablet (10 mg total) by mouth every 8 (eight) hours.     HYDROcodone-acetaminophen 5-325 MG tablet Commonly known as:  NORCO/VICODIN Take 1 tablet every 6 hours as needed for back pain   insulin aspart 100 UNIT/ML FlexPen Commonly known as:  NOVOLOG Inject 8 units three times a day with meals   Insulin Detemir 100 UNIT/ML Pen Commonly known as:  LEVEMIR FLEXTOUCH Inject 22 units at bedtime.   isosorbide mononitrate 30 MG 24 hr tablet Commonly known as:  IMDUR Take 1 tablet (30 mg total) by mouth daily.   latanoprost 0.005 % ophthalmic solution Commonly known as:  XALATAN Place 1 drop into both eyes at bedtime.   levothyroxine 125 MCG tablet Commonly known as:  SYNTHROID, LEVOTHROID Take 125 mcg by mouth daily before breakfast.   methocarbamol 500 MG tablet Commonly known as:  ROBAXIN Take 1 tablet (500 mg total) by mouth 3 (three) times daily. As needed   metoprolol succinate 25 MG 24 hr tablet Commonly known as:  TOPROL-XL Take 1 tablet (25 mg total) by mouth daily.   omeprazole 20 MG capsule Commonly known as:  PRILOSEC TAKE 1 CAPSULE DAILY   Pen Needles 31G X 6 MM Misc Use to inject insulin 4 times per day.   polyethylene glycol packet Commonly known as:  MIRALAX / GLYCOLAX Take 17 g by mouth daily as needed for moderate constipation.   rosuvastatin 20 MG tablet Commonly known as:  CRESTOR Take 0.5 tablets (10 mg total) by mouth daily.   VENTOLIN HFA 108 (90 Base) MCG/ACT inhaler Generic drug:  albuterol inhale 2 puffs by mouth INTO THE LUNGS every 6 hours if needed for wheezing   VITAMIN B 12 PO Take 1 tablet by mouth daily.   vitamin C 500 MG tablet Commonly known as:  ASCORBIC ACID Take 500 mg by mouth at bedtime.   Vitamin D (Ergocalciferol) 50000 units Caps capsule Commonly known as:  DRISDOL TAKE 1 CAPSULE BY MOUTH EVERY 7 DAYS ON THURSDAYS       Allergies: No Known Allergies  Past Medical History:  Diagnosis Date  . AAA (abdominal aortic aneurysm) ([place])   . Bilateral hydronephrosis   .  Carotid artery stenosis    carotid doppler 06/2012 - Right CCA/Bulb/ICA with chronic occlusion; L vertebral artery with abnormal blood flow; L Bulb/Prox ICA  s/p endarterectomy with mild fibrous plaque, 50% diameter reduction  . CHF (congestive heart failure) ([place])   . Choledocholithiasis 2017  . CKD (chronic kidney disease), stage III   . Coronary artery disease due to lipid rich plaque cardiologist-  dr berry   s/p CABG x6 1997-- cath 12-09-2009 occluded vein to obtuse marginal  branch and ramus branch with patent vien to PDA and patent LIMA to LAD, ef 40%-- Myoview 11-24-2011, nonischemic  . Diverticulosis   . Dyspnea on exertion   . GERD (gastroesophageal reflux disease)   . GI bleed   . History of sepsis    10-18-2014 w/ acute pyelonephritis  . Hyperlipidemia   . Hypertension   . Hypothyroidism   . Ischemic cardiomyopathy    03-25-2010-- per lasts echo EF  50-55%  . PAD (peripheral artery disease) ([place])    09/2010 LEAs - R ABI of 0.45, occluded fem-pop bypass graft, L ABI of 0.59 with occluded SFA; severe arterial insuff  . PVD (peripheral vascular disease) with claudication ([place])    last duplex 07-04-2015 -- Right CCA and ICA chronic occlusion, 38-75% LICA, Patent vertebral arteries w/ antegrade flow, bilateral normal subclavian arteries   . Retroperitoneal fibrosis   . Tubular adenoma of colon   . Type 2 diabetes mellitus ([place])    monitored by dr Dwyane Dee    Past Surgical History:  Procedure Laterality Date  . AORTA - BILATERAL FEMORAL ARTERY BYPASS GRAFT  1997   and RIGHT FEM-POP   . CARDIAC CATHETERIZATION  12-09-2009  dr al little   EF >40%-- occluded vein to OM & ramus branches, patent vein to PDA, patent LIMA to LAD (Dr. [name], [facility]) - later had thrombectomy of R fem-pop bypass ad R common femoral & profunda femoris artery (Dr. Oneida Alar)  . CARDIOVASCULAR STRESS TEST  11-24-2011   dr berry   Low Risk study: fixed basal to mid inferior attenuation artifact, no reversible  ischemia,  normal LV function and wall motion , ef 67%  . CAROTID ENDARTERECTOMY Bilateral right 1994//  left ?  Marland Kitchen CATARACT EXTRACTION W/ INTRAOCULAR LENS  IMPLANT, BILATERAL  2006  . CHOLECYSTECTOMY N/A 09/05/2015   Procedure: ATTEMPTED LAPAROSCOPIC CHOLECYSTECTOMY, EXPLORATORY LAPAROTOMY WITH CHOLECYSTECTOMY;  Surgeon: Autumn Messing III, MD;  Location: [facility];  Service: General;  Laterality: N/A;  . COLONOSCOPY    . COLONOSCOPY  10/21/2015  . COLONOSCOPY WITH PROPOFOL N/A 10/21/2015   Procedure: COLONOSCOPY WITH PROPOFOL;  Surgeon: Milus Banister, MD;  Location: [facility];  Service: Endoscopy;  Laterality: N/A;  . CORONARY ARTERY BYPASS GRAFT  1997   x6; internal mammary to LAD, SVG to ramus #1 & #2, SVG to OM, SVG to PDA,   . CYSTOSCOPY W/ RETROGRADES Right 08/01/2015   Procedure: CYSTOSCOPY WITH RETROGRADE PYELOGRAM;  Surgeon: Ardis Hughs, MD;  Location: [facility];  Service: Urology;  Laterality: Right;  . CYSTOSCOPY W/ URETERAL STENT PLACEMENT  03/10/2012   Procedure: CYSTOSCOPY WITH RETROGRADE PYELOGRAM/URETERAL STENT PLACEMENT;  Surgeon: Hanley Ben, MD;  Location: [facility];  Service: Urology;  Laterality: Left;  . CYSTOSCOPY W/ URETERAL STENT PLACEMENT Bilateral 03/07/2015   Procedure: BILATERAL RETROGRADE PYELOGRAM AND RIGHT URETERAL STENT PLACEMENT;  Surgeon: Ardis Hughs, MD;  Location: [facility];  Service: Urology;  Laterality: Bilateral;  . CYSTOSCOPY W/ URETERAL STENT PLACEMENT Right 08/01/2015   Procedure: CYSTOSCOPY WITH STENT REPLACEMENT;  Surgeon: Ardis Hughs, MD;  Location: [facility];  Service: Urology;  Laterality: Right;  . ERCP N/A 09/03/2015   Procedure: ENDOSCOPIC RETROGRADE CHOLANGIOPANCREATOGRAPHY (ERCP);  Surgeon: Irene Shipper, MD;  Location: [facility] ENDOSCOPY;  Service: Endoscopy;  Laterality: N/A;  . ESOPHAGOGASTRODUODENOSCOPY    . ESOPHAGOGASTRODUODENOSCOPY N/A 10/19/2015   Procedure:  ESOPHAGOGASTRODUODENOSCOPY (EGD);  Surgeon: Gatha Mayer, MD;  Location[facility] END[facility];  Service: Endoscopy;  Laterality: N/A;  . GIVENS CAPSULE STUDY  10/21/2015  . GIVENS CAPSULE STUDY N/A 10/21/2015   Procedure: GIVENS CAPSULE STUDY;  Surgeon: Milus Banister, MD;  Location: [facility];  Service: Endoscopy;  Laterality: N/A;  . REPAIR RIGHT FEMORAL PSEUDOANEUYSM/  RIGHT FEM-POP BYPASS GRAFT/  DEBRIDEMENT RIGHT LOWER EXTREMITIY VENOUS STATUS ULCERS X2  01-12-2005  . TOTAL ABDOMINAL HYSTERECTOMY W/ BILATERAL SALPINGOOPHORECTOMY  1986  . TRANSTHORACIC ECHOCARDIOGRAM  03/25/2010   EF 53-97%, LV systolic function low normal with mild inferoseptal hypocontractility; LA mildly dilated; mod MR; mild TR, RV systolic pressure elevated, mild pulm HTN; AV mildly sclerotic; mild pulm valve regurg; aortic root sclerosis/calcif     Family History  Problem Relation Age of Onset  . Congestive Heart Failure Mother   . Diabetes Mother   . Stroke Father   . Cancer Maternal Aunt     Breast cancer    Social History:  reports that she quit smoking about 6 years ago. She has a 60.00 pack-year smoking history. She has never used smokeless tobacco. She reports that she drinks about 1.8 oz of alcohol per week . She reports that she does not use drugs.  Review of Systems -     Hyperlipidemia: Has history of high LDL and triglycerides/low HDL  Was changed from Lipitor to Crestor because of higher LDL and she was not wanting to pay the higher cost of adding Zetia   LDL is Controlled, her Crestor was reduced to half a tablet probably because of renal dysfunction in the hospital   Tends to have high triglycerides also despite taking fenofibrate   Lab Results  Component Value Date   CHOL 162 02/02/2016   HDL 30.80 (L) 02/02/2016   LDLCALC 49 09/13/2013   LDLDIRECT 69.0 02/02/2016   TRIG 264.0 (H) 02/02/2016   CHOLHDL 5 02/02/2016     Vitamin D deficiency: She has been on supplements and taking her  weekly dose,Her level is high normal now  She is Has recurrent low back pain which is chronic and wants a refill of her hydrocodone    Examination:   BP 122/60 (BP Location: Left Arm, Patient Position: Sitting, Cuff Size: Normal)   Pulse 60   Temp 97.6 F (36.4 C) (Oral)   Wt 140 lb (63.5 kg)   LMP  (Exact Date)   BMI 22.60 kg/m   Body mass index is 22.6 kg/m.    Heart sounds normal. No pedal edema present    Assesment/PLAN:   CHF: This may have been precipitated by her atrial fibrillation She is on Lasix, hydralazine and metoprolol Clinically doing well, recently seen by cardiologist. BNP to be checked in follow-up today  RENAL insufficiency: Her creatinine increased significantly probably from getting diuresis in the hospital Blood pressure is also low normal today without symptoms Will recheck her labs and decide on the doses of diuretics and antihypertensives and coordination with the cardiologist    ANEMIA: She needs follow-up, last hemoglobin stable at 10.9 She will continue on supplements with iron   VITAMIN D deficiency: Her level is high normal and she can take her vitamin D every other week now  DIABETES: Blood sugars have  been reasonably well-controlled She will continue her  current basal bolus insulin-Same doses and bring her monitor on the next visit  HYPOTHYROIDISM: Her TSH is back to normal with current regimen of 125 levothyroxine and will continue   Total visit time for evaluation and management of multiple problems, review of hospital  records, labs = 25 minutes   [facility]  02/09/16

## 2016-02-09 NOTE — Addendum Note (Signed)
Addended by: Kaylyn Lim I on: 02/09/2016 01:34 PM   Modules accepted: Orders

## 2016-02-10 NOTE — Progress Notes (Signed)
Please let patient know that the kidney test is still not better, reduce Lasix to every other day since heart failure test is much better

## 2016-02-12 ENCOUNTER — Other Ambulatory Visit: Payer: Self-pay | Admitting: Urology

## 2016-02-12 ENCOUNTER — Encounter (HOSPITAL_COMMUNITY): Payer: Self-pay | Admitting: *Deleted

## 2016-02-12 NOTE — Progress Notes (Signed)
Case reviewed by Dr Lissa Hoard

## 2016-02-12 NOTE — Progress Notes (Signed)
lov dr Dwyane Dee 02/09/16, lov l kilroy pa 02/04/16, cbc 02/10/16, hgba1c 11/17, chest 01/23/16, eccho 01/24/16, ekg 02/04/16  ALL IN EPIC

## 2016-02-13 ENCOUNTER — Ambulatory Visit (HOSPITAL_COMMUNITY): Payer: Medicare Other | Admitting: Anesthesiology

## 2016-02-13 ENCOUNTER — Ambulatory Visit (HOSPITAL_COMMUNITY)
Admission: RE | Admit: 2016-02-13 | Discharge: 2016-02-13 | Disposition: A | Discharge status: Home / Self Care | Payer: Medicare Other | Source: Ambulatory Visit | Attending: Urology | Admitting: Urology

## 2016-02-13 ENCOUNTER — Encounter (HOSPITAL_COMMUNITY): Payer: Self-pay | Admitting: *Deleted

## 2016-02-13 ENCOUNTER — Ambulatory Visit (HOSPITAL_COMMUNITY): Payer: Medicare Other

## 2016-02-13 ENCOUNTER — Encounter (HOSPITAL_COMMUNITY)
Admission: RE | Disposition: A | Discharge status: Home / Self Care | Payer: Self-pay | Source: Ambulatory Visit | Attending: Urology

## 2016-02-13 DIAGNOSIS — N1330 Unspecified hydronephrosis: Secondary | ICD-10-CM | POA: Diagnosis present

## 2016-02-13 DIAGNOSIS — Z90710 Acquired absence of both cervix and uterus: Secondary | ICD-10-CM | POA: Diagnosis not present

## 2016-02-13 DIAGNOSIS — K219 Gastro-esophageal reflux disease without esophagitis: Secondary | ICD-10-CM | POA: Diagnosis not present

## 2016-02-13 DIAGNOSIS — Z466 Encounter for fitting and adjustment of urinary device: Secondary | ICD-10-CM | POA: Diagnosis not present

## 2016-02-13 DIAGNOSIS — Z951 Presence of aortocoronary bypass graft: Secondary | ICD-10-CM | POA: Insufficient documentation

## 2016-02-13 DIAGNOSIS — Z79899 Other long term (current) drug therapy: Secondary | ICD-10-CM | POA: Diagnosis not present

## 2016-02-13 DIAGNOSIS — N135 Crossing vessel and stricture of ureter without hydronephrosis: Secondary | ICD-10-CM | POA: Diagnosis not present

## 2016-02-13 DIAGNOSIS — I110 Hypertensive heart disease with heart failure: Secondary | ICD-10-CM | POA: Diagnosis not present

## 2016-02-13 DIAGNOSIS — I5042 Chronic combined systolic (congestive) and diastolic (congestive) heart failure: Secondary | ICD-10-CM | POA: Diagnosis not present

## 2016-02-13 DIAGNOSIS — Z7982 Long term (current) use of aspirin: Secondary | ICD-10-CM | POA: Diagnosis not present

## 2016-02-13 DIAGNOSIS — E118 Type 2 diabetes mellitus with unspecified complications: Secondary | ICD-10-CM | POA: Diagnosis not present

## 2016-02-13 DIAGNOSIS — Z4682 Encounter for fitting and adjustment of non-vascular catheter: Secondary | ICD-10-CM | POA: Diagnosis not present

## 2016-02-13 DIAGNOSIS — I739 Peripheral vascular disease, unspecified: Secondary | ICD-10-CM | POA: Insufficient documentation

## 2016-02-13 DIAGNOSIS — N131 Hydronephrosis with ureteral stricture, not elsewhere classified: Secondary | ICD-10-CM | POA: Insufficient documentation

## 2016-02-13 DIAGNOSIS — Z87891 Personal history of nicotine dependence: Secondary | ICD-10-CM | POA: Diagnosis not present

## 2016-02-13 DIAGNOSIS — I119 Hypertensive heart disease without heart failure: Secondary | ICD-10-CM | POA: Diagnosis not present

## 2016-02-13 DIAGNOSIS — I2510 Atherosclerotic heart disease of native coronary artery without angina pectoris: Secondary | ICD-10-CM | POA: Diagnosis not present

## 2016-02-13 DIAGNOSIS — Z419 Encounter for procedure for purposes other than remedying health state, unspecified: Secondary | ICD-10-CM

## 2016-02-13 HISTORY — PX: CYSTOSCOPY W/ URETERAL STENT PLACEMENT: SHX1429

## 2016-02-13 LAB — BASIC METABOLIC PANEL
Anion gap: 9 (ref 5–15)
BUN: 43 mg/dL — AB (ref 6–20)
CHLORIDE: 108 mmol/L (ref 101–111)
CO2: 23 mmol/L (ref 22–32)
CREATININE: 1.6 mg/dL — AB (ref 0.44–1.00)
Calcium: 9.2 mg/dL (ref 8.9–10.3)
GFR calc Af Amer: 36 mL/min — ABNORMAL LOW (ref >60)
GFR, EST NON AFRICAN AMERICAN: 31 mL/min — AB (ref >60)
GLUCOSE: 147 mg/dL — AB (ref 65–99)
Potassium: 3.5 mmol/L (ref 3.5–5.1)
SODIUM: 140 mmol/L (ref 135–145)

## 2016-02-13 LAB — GLUCOSE, CAPILLARY
GLUCOSE-CAPILLARY: 120 mg/dL — AB (ref 65–99)
Glucose-Capillary: 127 mg/dL — ABNORMAL HIGH (ref 65–99)

## 2016-02-13 SURGERY — CYSTOSCOPY, FLEXIBLE, WITH STENT REPLACEMENT
Anesthesia: General | Laterality: Right

## 2016-02-13 MED ORDER — LACTATED RINGERS IV SOLN
INTRAVENOUS | Status: DC
  Administered 2016-02-13: 1000 mL via INTRAVENOUS

## 2016-02-13 MED ORDER — CIPROFLOXACIN IN D5W 400 MG/200ML IV SOLN
400.0000 mg | INTRAVENOUS | Status: AC
  Administered 2016-02-13: 400 mg via INTRAVENOUS

## 2016-02-13 MED ORDER — LIDOCAINE 2% (20 MG/ML) 5 ML SYRINGE
INTRAMUSCULAR | Status: DC | PRN
  Administered 2016-02-13: 30 mg via INTRAVENOUS

## 2016-02-13 MED ORDER — IOHEXOL 300 MG/ML  SOLN
INTRAMUSCULAR | Status: DC | PRN
  Administered 2016-02-13: 5.5 mL via URETHRAL

## 2016-02-13 MED ORDER — OXYCODONE HCL 5 MG/5ML PO SOLN
5.0000 mg | Freq: Once | ORAL | Status: DC | PRN
  Filled 2016-02-13: qty 5

## 2016-02-13 MED ORDER — PHENAZOPYRIDINE HCL 200 MG PO TABS: 200.0000 mg | ORAL_TABLET | Freq: Three times a day (TID) | ORAL | 0 refills | Status: DC | PRN

## 2016-02-13 MED ORDER — ONDANSETRON HCL 4 MG/2ML IJ SOLN
INTRAMUSCULAR | Status: DC | PRN
  Administered 2016-02-13: 4 mg via INTRAVENOUS

## 2016-02-13 MED ORDER — FENTANYL CITRATE (PF) 100 MCG/2ML IJ SOLN
INTRAMUSCULAR | Status: DC | PRN
  Administered 2016-02-13 (×2): 50 ug via INTRAVENOUS

## 2016-02-13 MED ORDER — FENTANYL CITRATE (PF) 100 MCG/2ML IJ SOLN
INTRAMUSCULAR | Status: AC
  Filled 2016-02-13: qty 2

## 2016-02-13 MED ORDER — PROPOFOL 10 MG/ML IV BOLUS
INTRAVENOUS | Status: DC | PRN
  Administered 2016-02-13: 120 mg via INTRAVENOUS

## 2016-02-13 MED ORDER — STERILE WATER FOR IRRIGATION IR SOLN
Status: DC | PRN
  Administered 2016-02-13: 3000 mL

## 2016-02-13 MED ORDER — MIDAZOLAM HCL 2 MG/2ML IJ SOLN
INTRAMUSCULAR | Status: AC
  Filled 2016-02-13: qty 2

## 2016-02-13 MED ORDER — ONDANSETRON HCL 4 MG/2ML IJ SOLN: 4.0000 mg | Freq: Four times a day (QID) | INTRAMUSCULAR | Status: DC | PRN

## 2016-02-13 MED ORDER — FENTANYL CITRATE (PF) 100 MCG/2ML IJ SOLN: 25.0000 ug | INTRAMUSCULAR | Status: DC | PRN

## 2016-02-13 MED ORDER — OXYCODONE HCL 5 MG PO TABS: 5.0000 mg | ORAL_TABLET | Freq: Once | ORAL | Status: DC | PRN

## 2016-02-13 MED ORDER — CIPROFLOXACIN IN D5W 400 MG/200ML IV SOLN
INTRAVENOUS | Status: AC
  Filled 2016-02-13: qty 200

## 2016-02-13 MED ORDER — ONDANSETRON HCL 4 MG/2ML IJ SOLN
INTRAMUSCULAR | Status: AC
  Filled 2016-02-13: qty 2

## 2016-02-13 MED ORDER — PROPOFOL 10 MG/ML IV BOLUS
INTRAVENOUS | Status: AC
  Filled 2016-02-13: qty 20

## 2016-02-13 SURGICAL SUPPLY — 11 items
BAG URO CATCHER STRL LF (MISCELLANEOUS) ×3 IMPLANT
CATH INTERMIT  6FR 70CM (CATHETERS) ×3 IMPLANT
CLOTH BEACON ORANGE TIMEOUT ST (SAFETY) ×3 IMPLANT
GLOVE BIOGEL M STRL SZ7.5 (GLOVE) ×3 IMPLANT
GOWN STRL REUS W/TWL LRG LVL3 (GOWN DISPOSABLE) ×6 IMPLANT
GUIDEWIRE STR DUAL SENSOR (WIRE) ×3 IMPLANT
MANIFOLD NEPTUNE II (INSTRUMENTS) ×3 IMPLANT
PACK CYSTO (CUSTOM PROCEDURE TRAY) ×3 IMPLANT
STENT URET 6FRX24 CONTOUR (STENTS) ×3 IMPLANT
TUBING CONNECTING 10 (TUBING) ×2 IMPLANT
TUBING CONNECTING 10' (TUBING) ×1

## 2016-02-13 NOTE — Discharge Instructions (Signed)
General Anesthesia, Adult, Care After These instructions provide you with information about caring for yourself after your procedure. Your health care provider may also give you more specific instructions. Your treatment has been planned according to current medical practices, but problems sometimes occur. Call your health care provider if you have any problems or questions after your procedure. What can I expect after the procedure? After the procedure, it is common to have:  Vomiting.  A sore throat.  Mental slowness. It is common to feel:  Nauseous.  Cold or shivery.  Sleepy.  Tired.  Sore or achy, even in parts of your body where you did not have surgery. Follow these instructions at home: For at least 24 hours after the procedure:  Do not:  Participate in activities where you could fall or become injured.  Drive.  Use heavy machinery.  Drink alcohol.  Take sleeping pills or medicines that cause drowsiness.  Make important decisions or sign legal documents.  Take care of children on your own.  Rest. Eating and drinking  If you vomit, drink water, juice, or soup when you can drink without vomiting.  Drink enough fluid to keep your urine clear or pale yellow.  Make sure you have little or no nausea before eating solid foods.  Follow the diet recommended by your health care provider. General instructions  Have a responsible adult stay with you until you are awake and alert.  Return to your normal activities as told by your health care provider. Ask your health care provider what activities are safe for you.  Take over-the-counter and prescription medicines only as told by your health care provider.  If you smoke, do not smoke without supervision.  Keep all follow-up visits as told by your health care provider. This is important. Contact a health care provider if:  You continue to have nausea or vomiting at home, and medicines are not helpful.  You  cannot drink fluids or start eating again.  You cannot urinate after 8-12 hours.  You develop a skin rash.  You have fever.  You have increasing redness at the site of your procedure. Get help right away if:  You have difficulty breathing.  You have chest pain.  You have unexpected bleeding.  You feel that you are having a life-threatening or urgent problem. This information is not intended to replace advice given to you by your health care provider. Make sure you discuss any questions you have with your health care provider. Document Released: 05/31/2000 Document Revised: 07/28/2015 Document Reviewed: 02/06/2015 Elsevier Interactive Patient Education  2017 [place] INSTRUCTIONS FOR KIDNEY STONE/URETERAL STENT   MEDICATIONS:  1.  Resume all your other meds from home - except do not take any extra narcotic pain meds that you may have at home.  2. Pyridium is to help with the burning/stinging when you urinate.   ACTIVITY:  1. No strenuous activity x 1week  2. No driving while on narcotic pain medications  3. Drink plenty of water  4. Continue to walk at home - you can still get blood clots when you are at home, so keep active, but don't over do it.  5. May return to work/school tomorrow or when you feel ready   BATHING:  1. You can shower and we recommend daily showers   SIGNS/SYMPTOMS TO CALL:  Please call us if you have a fever greater than 101.5, uncontrolled nausea/vomiting, uncontrolled pain, dizziness, unable to urinate, bloody urine, chest pain, shortness of breath,  leg swelling, leg pain, redness around wound, drainage from wound, or any other concerns or questions.   You can reach [place] at [phone].   FOLLOW-UP:  1. You have an appointment in 6 months to re-evaluate and schedule your next exchange.

## 2016-02-13 NOTE — H&P (Signed)
Urethral Stricture  HPI: [name] [name] is a 75 year-old female established patient who is here for further eval and management of a ureteral stricture.  Her problem has been present for a few years. She has had previous surgery as a possible source of her ureteral stricture. She has had chronic stent for treatment of their ureteral stricture.   She is currently not having trouble urinating. She is not having problems getting her urine stream started. The patient does not complain of urinary leakage. She is not urinating more frequently now than usual. She does not dribble at the end of urination.   She is not having flank pain. She does have a history of urinary infections. Her renal function is normal. She did not see blood in her urine.   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 The left side was initially obstructed and managed with a stent. Differential function demonstrated little function and stent was removed. Right hydro developed in Fall 2016. No being managed with stents, not a good surgical candidate otherwise. July 2017 patient was having blood in stool. Plavix medication was causing blood vessels to bleed and therefore had gallbladder removed and plavix was stopped.  01/26/16 BUN 60.000 and creatinine, serum 2.380.     ALLERGIES: None   MEDICATIONS: Crestor 10 mg tablet  Albuterol Sulfate (2.5 MG/3ML) 0.083% Inhalation Nebulization Solution Inhalation  Aspirin 81 MG TABS Oral  Calcium/Vitamin D/Minerals TABS Oral  Ferrous Sulfate 325 (65 Fe) MG Oral Tablet Delayed Release Oral  Furosemide 20 MG Oral Tablet Oral  Hydralazine Hcl  Hydrocodone-Acetaminophen 5-500 MG TABS Oral  Levemir FlexPen 100 UNIT/ML SOLN Subcutaneous  Magnesium Oxide 400 MG Oral Tablet Oral  Metoprolol Succinate ER 25 MG Oral Tablet Extended Release 24 Hour Oral  NovoLOG FlexPen 100 UNIT/ML SOLN Subcutaneous  Omeprazole 20 MG Oral Capsule Delayed Release Oral  Synthroid 137 MCG Oral Tablet  Oral  Vitamin B-12 TABS Oral  Vitamin C TABS Oral  Vitamin D (Ergocalciferol) 50000 UNIT Oral Capsule Oral     GU PSH: Cystoscopy Insert Stent - 08/06/2015, 03/20/2015, 2014, 2014 Hysterectomy Unilat SO - 2014      PSH Notes: Cystoscopy With Insertion Of Ureteral Stent Right, Cystoscopy With Insertion Of Ureteral Stent Right, Cesarean Section, Hysterectomy, Coronary Artery Single Venous Bypass Graft, Cystoscopy With Insertion Of Ureteral Stent, Cystoscopy With Insertion Of Ureteral Stent Left   NON-GU PSH: Cesarean Delivery Only - 2014 Cholecystectomy, 09/2015 Coronary Artery Bypass Grafting (cabg) - 2014    GU PMH: Urinary Tract Inf, Unspec site, Urinary tract infection - 07/28/2015 Hydronephrosis Unspec, Bilateral hydronephrosis - 03/27/2015, Hydronephrosis, left, - 2016 Renal Cysts, Simple, Renal cyst, acquired - 2014      PMH Notes: The patient has a history of retroperitoneal fibrosis and in 2015 she had left sided hydronephrosis. She had a stent placed in the spring of 2015 followed by renogram demonstrating 18% function on the left kidney. Her stent was subsequently removed.  Recently she developed bilateral hydronephrosis, renogram demonstrated bilateral obstruction with 85% right function and 15% left sided function a significant change from her previous ultrasounds and renograms. In Jan 2015 she had no hydro or evidence of right sided obstruction.   02/21/15 bilateral RPGs- left side showed a normal distal ureter to the pelvic brim where it stopped abruptly and I was unable to opacify the proximal portion. On the right side showed a normal distal ureter with abrupt narrowing at the pelvic brim and proximal dilation. A right sided stent was  placed.   She has a history of recurrent infections. In 2015 she had 4 Escherichia coli infections.   03/2015: treated for UTI - keflex - no growth. recommdended lactobacillus, as well as cranberry tablets twice daily   2/17: BUN/Cr- 30/1.78      NON-GU PMH: Encounter for general adult medical examination without abnormal findings, Encounter for preventive health examination - 01/14/2015 Personal history of other diseases of the circulatory system, History of hypertension - 2014, History of cardiac disorder, - 2014 Personal history of other endocrine, nutritional and metabolic disease, History of hypothyroidism - 2014, History of diabetes mellitus, - 2014, History of hypercholesterolemia, - 2014    FAMILY HISTORY: Diabetes - Mother Family Health Status Number - Father Father Deceased At Age38 ___ - Father Hypertension - Runs In Family Mother Deceased At Age 22 from diabetic complicati - Father nephrolithiasis - Runs In Family Transient Ischemic Attack - Mother   SOCIAL HISTORY: Marital Status: Single     Notes: Former smoker, Tobacco use, Marital History - Single, Retired From Work, Alcohol Use, Caffeine Use   REVIEW OF SYSTEMS:    GU Review Female:   Patient denies frequent urination, hard to postpone urination, burning /pain with urination, get up at night to urinate, leakage of urine, stream starts and stops, trouble starting your stream, have to strain to urinate, and currently pregnant.  Gastrointestinal (Upper):   Patient denies nausea, vomiting, and indigestion/ heartburn.  Gastrointestinal (Lower):   Patient denies constipation and diarrhea.  Constitutional:   Patient denies fever, night sweats, weight loss, and fatigue.  Skin:   Patient denies skin rash/ lesion and itching.  Eyes:   Patient denies blurred vision and double vision.  Ears/ Nose/ Throat:   Patient denies sore throat and sinus problems.  Hematologic/Lymphatic:   Patient denies swollen glands and easy bruising.  Cardiovascular:   Patient denies leg swelling and chest pains.  Respiratory:   Patient denies cough and shortness of breath.  Endocrine:   Patient denies excessive thirst.  Musculoskeletal:   Patient reports back pain. Patient denies joint pain.   Neurological:   Patient denies headaches and dizziness.  Psychologic:   Patient denies depression and anxiety.   Notes: Lawrence   VITAL SIGNS:      02/05/2016 02:25 PM  Weight 138 lb / 62.6 kg  BP 120/62 mmHg  Pulse 60 /min  Temperature 97.6 F / 36 C   MULTI-SYSTEM PHYSICAL EXAMINATION:    Constitutional: Well-nourished. No physical deformities. Normally developed. Good grooming.  Neck: Neck symmetrical, not swollen. Normal tracheal position.  Respiratory: No labored breathing, no use of accessory muscles. clear to auscultation  Cardiovascular: Normal temperature, normal extremity pulses, no swelling, no varicosities. Regular rate and rhythm  Lymphatic: No enlargement of neck, axillae, groin.  Skin: No paleness, no jaundice, no cyanosis. No lesion, no ulcer, no rash.  Neurologic / Psychiatric: Oriented to time, oriented to place, oriented to person. No depression, no anxiety, no agitation.  Gastrointestinal: No mass, no tenderness, no rigidity, non obese abdomen.  Eyes: Normal conjunctivae. Normal eyelids.  Ears, Nose, Mouth, and Throat: Left ear no scars, no lesions, no masses. Right ear no scars, no lesions, no masses. Nose no scars, no lesions, no masses. Normal hearing. Normal lips.  Musculoskeletal: Normal gait and station of head and neck.     PAST DATA REVIEWED:  Source Of History:  Patient  Records Review:   Previous Hospital Records, Previous Patient Records   PROCEDURES:  Urinalysis w/Scope Dipstick Dipstick Cont'd Micro  Color: Straw Bilirubin: Neg WBC/hpf: 20 - 40/hpf  Appearance: Cloudy Ketones: Neg RBC/hpf: NS (Not Seen)  Specific Gravity: 1.015 Blood: Neg Bacteria: NS (Not Seen)  pH: 5.5 Protein: Trace Cystals: NS (Not Seen)  Glucose: Neg Urobilinogen: 0.2 Casts: NS (Not Seen)    Nitrites: Neg Trichomonas: Not Present    Leukocyte Esterase: 1+ Mucous: Not Present      Epithelial Cells: 0 - 5/hpf      Yeast: NS (Not Seen)      Sperm: Not Present     ASSESSMENT:      ICD-10 Details  1 GU:   Crossing vessel and stricture of ureter w/o hydronephrosis - N13.5 Right, 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 This is being managed withchronic indwelling stents. She is due to have her stent changed soon, her last stent was exchanged 6 months prior. 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   PLAN:           Orders Labs Urine Culture and Sensitivity          Document Letter(s):  Created for Patient: Clinical Summary         Notes:   The patient was recently discharged from the hospital for a CHF exacerbation. Currently she looks and feels well. She looks as good as she has in some time. I think that we should proceed to the OR to have her stent exchanged ASAP. We discussed the procedure, she's been through this before. She understands the risk and the benefits.

## 2016-02-13 NOTE — Op Note (Signed)
Preoperative diagnosis:  1. Bilateral ureteral strictures   Postoperative diagnosis:  1. same   Procedure: 1.  cystoscopy, right retrograde pyelograms, right ureteral stent exchange  Surgeon: Ardis Hughs, MD  Anesthesia: General  Complications: None  Intraoperative findings:  5 French open ureteral catheter was used to perform a right retrograde pyelogram using 5.5 cc of Omnipaque contrast.  This demonstrated a mildly dilated right renal pelvis with a normal caliber ureter.  There were no filling defects.  EBL: Minimal  Specimens: None  Indication: BRINNLEY LACAP is a 75 y.o. patient with   History of ureteral strictures within her pelvis secondary to retroperitoneal fibrosis.  She initially developed a left-sided stricture that was intermittently treated with double-J ureteral stent.  However, a Lasix renogram demonstrated minimal function and as such the stent was removed.  She subsequently developed right hydronephrosis.  This has now been treated with chronic indwelling stents which have been changed every 6 months.  She is due for her stent to be exchanged today..  After reviewing the management options for treatment, he elected to proceed with the above surgical procedure(s). We have discussed the potential benefits and risks of the procedure, side effects of the proposed treatment, the likelihood of the patient achieving the goals of the procedure, and any potential problems that might occur during the procedure or recuperation. Informed consent has been obtained.  Description of procedure:  The patient was taken to the operating room and general anesthesia was induced.  The patient was placed in the dorsal lithotomy position, prepped and draped in the usual sterile fashion, and preoperative antibiotics were administered. A preoperative time-out was performed.     A 21 French 30 cystoscope was gently passed through the patient's urethra and into the bladder.  A 360  cystoscopic evaluation was then performed with no significant bladder abnormalities.  I then grasped the stent with a stent grasper that was emanating from the patient's right ureteral orifice and pulled to the urethral meatus.  I then wired distended with a 0.038 sensor wire and advanced it up to the right renal pelvis removing stent over the wire.  I then exchanged the wire for a 5 [place] open-ended ureteral catheter and performed a retrograde pyelogram with the above findings.  I then reintroduced the 0.038 sensor wire through the open-ended catheter and removed the catheter over the wire.  This point I advanced a 6 French 24 cm double-J ureteral stent over the wire and into the right renal pelvis under fluoroscopic guidance.  I advanced the stent up to the patient's renal pelvis and then gently applying pressure to the patient's pubic bone with the wire in between the finger this wire was removed leaving the stent behind.  I then use the beak of the cystoscope and drained the patient's bladder.  Fluoroscopic images demonstrated a nice curl in the patient's bladder as well as in the renal pelvis.  The patient was subsequently extubated and returned to PACU in stable condition.  The patient will be scheduled for follow-up in 6 months for preop evaluation and plan stent exchange.  Ardis Hughs, M.D.

## 2016-02-13 NOTE — Transfer of Care (Signed)
Immediate Anesthesia Transfer of Care Note  Patient: [name] [name]  Procedure(s) Performed: Procedure(s): RIGHT URETERAL STENT EXCHANGE (Right)  Patient Location: PACU  Anesthesia Type:General  Level of Consciousness:  sedated, patient cooperative and responds to stimulation  Airway & Oxygen Therapy:Patient Spontanous Breathing and Patient connected to face mask oxgen  Post-op Assessment:  Report given to PACU RN and Post -op Vital signs reviewed and stable  Post vital signs:  Reviewed and stable  Last Vitals:  Vitals:   02/13/16 0904  BP: (!) 112/37  Pulse: (!) 57  Resp: 16  Temp: 14.4 C    Complications: No apparent anesthesia complications

## 2016-02-13 NOTE — Anesthesia Postprocedure Evaluation (Signed)
Anesthesia Post Note  Patient: [name] [name]  Procedure(s) Performed: Procedure(s) (LRB): RIGHT URETERAL STENT EXCHANGE (Right)  Patient location during evaluation: PACU Anesthesia Type: General Level of consciousness: awake and alert and patient cooperative Pain management: pain level controlled Vital Signs Assessment: post-procedure vital signs reviewed and stable Respiratory status: spontaneous breathing and respiratory function stable Cardiovascular status: stable Anesthetic complications: no    Last Vitals:  Vitals:   02/13/16 1314 02/13/16 1323  BP: 124/86 (!) 138/46  Pulse: (!) 51 (!) 52  Resp: 18 16  Temp: 36.4 C 36.4 C    Last Pain:  Vitals:   02/13/16 1253  TempSrc:   PainSc: 0-No pain                 Apostolos Blagg S

## 2016-02-13 NOTE — Anesthesia Preprocedure Evaluation (Signed)
Anesthesia Evaluation  Patient identified by MRN, date of birth, ID band Patient awake    Reviewed: Allergy & Precautions, H&P , NPO status , Patient's Chart, lab work & pertinent test results  Airway Mallampati: II   Neck ROM: full    Dental   Pulmonary former smoker,    breath sounds clear to auscultation       Cardiovascular hypertension, + CAD, + Peripheral Vascular Disease, +CHF and + DOE   Rhythm:regular Rate:Normal  Stress test (2013): non-ischemic   Neuro/Psych    GI/Hepatic GERD  ,  Endo/Other  diabetes, Type 2Hypothyroidism   Renal/GU Renal InsufficiencyRenal disease     Musculoskeletal  (+) Arthritis ,   Abdominal   Peds  Hematology   Anesthesia Other Findings   Reproductive/Obstetrics                             Anesthesia Physical Anesthesia Plan  ASA: III  Anesthesia Plan: General   Post-op Pain Management:    Induction: Intravenous  Airway Management Planned: LMA  Additional Equipment:   Intra-op Plan:   Post-operative Plan:   Informed Consent: I have reviewed the patients History and Physical, chart, labs and discussed the procedure including the risks, benefits and alternatives for the proposed anesthesia with the patient or authorized representative who has indicated his/her understanding and acceptance.     Plan Discussed with: CRNA, Anesthesiologist and Surgeon  Anesthesia Plan Comments:         Anesthesia Quick Evaluation

## 2016-02-13 NOTE — Anesthesia Procedure Notes (Signed)
Procedure Name: LMA Insertion Date/Time: 02/13/2016 12:24 PM Performed by: Lajuana Carry E Pre-anesthesia Checklist: Patient identified, Emergency Drugs available, Suction available and Patient being monitored Patient Re-evaluated:Patient Re-evaluated prior to inductionOxygen Delivery Method: Circle system utilized Preoxygenation: Pre-oxygenation with 100% oxygen Intubation Type: IV induction Ventilation: Mask ventilation without difficulty LMA: LMA inserted LMA Size: 4.0 Number of attempts: 1 Placement Confirmation: positive ETCO2 and breath sounds checked- equal and bilateral Dental Injury: Teeth and Oropharynx as per pre-operative assessment

## 2016-02-16 ENCOUNTER — Encounter (HOSPITAL_COMMUNITY): Payer: Self-pay | Admitting: Urology

## 2016-02-17 ENCOUNTER — Other Ambulatory Visit: Payer: Self-pay | Admitting: Endocrinology

## 2016-02-17 ENCOUNTER — Encounter (HOSPITAL_COMMUNITY): Payer: Self-pay | Admitting: Urology

## 2016-02-25 ENCOUNTER — Other Ambulatory Visit: Payer: Self-pay | Admitting: Endocrinology

## 2016-03-07 ENCOUNTER — Other Ambulatory Visit: Payer: Self-pay | Admitting: Endocrinology

## 2016-03-10 DIAGNOSIS — N183 Chronic kidney disease, stage 3 (moderate): Secondary | ICD-10-CM | POA: Diagnosis not present

## 2016-03-10 DIAGNOSIS — I484 Atypical atrial flutter: Secondary | ICD-10-CM | POA: Diagnosis not present

## 2016-03-10 LAB — CBC
HCT: 35.8 % (ref 35.0–45.0)
Hemoglobin: 11 g/dL — ABNORMAL LOW (ref 11.7–15.5)
MCH: 27.7 pg (ref 27.0–33.0)
MCHC: 30.7 g/dL — ABNORMAL LOW (ref 32.0–36.0)
MCV: 90.2 fL (ref 80.0–100.0)
MPV: 11.3 fL (ref 7.5–12.5)
Platelets: 213 10*3/uL (ref 140–400)
RBC: 3.97 MIL/uL (ref 3.80–5.10)
RDW: 16 % — ABNORMAL HIGH (ref 11.0–15.0)
WBC: 8.3 10*3/uL (ref 3.8–10.8)

## 2016-03-10 NOTE — Telephone Encounter (Signed)
Patient is call on the status of her vitamin D results please advise

## 2016-03-10 NOTE — Telephone Encounter (Signed)
Okay to refill? Please advise. Thank you!

## 2016-03-11 ENCOUNTER — Other Ambulatory Visit: Payer: Self-pay

## 2016-03-11 LAB — BASIC METABOLIC PANEL
BUN: 34 mg/dL — ABNORMAL HIGH (ref 7–25)
CO2: 19 mmol/L — ABNORMAL LOW (ref 20–31)
Calcium: 8.7 mg/dL (ref 8.6–10.4)
Chloride: 108 mmol/L (ref 98–110)
Creat: 1.38 mg/dL — ABNORMAL HIGH (ref 0.60–0.93)
Glucose, Bld: 132 mg/dL — ABNORMAL HIGH (ref 65–99)
Potassium: 4.5 mmol/L (ref 3.5–5.3)
Sodium: 142 mmol/L (ref 135–146)

## 2016-03-11 MED ORDER — VITAMIN D (ERGOCALCIFEROL) 1.25 MG (50000 UNIT) PO CAPS: ORAL_CAPSULE | ORAL | 3 refills | Status: DC

## 2016-03-11 NOTE — Telephone Encounter (Signed)
Please change the prescription to 1 capsule every 14 days

## 2016-03-17 ENCOUNTER — Ambulatory Visit (INDEPENDENT_AMBULATORY_CARE_PROVIDER_SITE_OTHER): Payer: Medicare Other | Admitting: Cardiovascular Disease

## 2016-03-17 ENCOUNTER — Encounter: Payer: Self-pay | Admitting: Cardiovascular Disease

## 2016-03-17 ENCOUNTER — Encounter (INDEPENDENT_AMBULATORY_CARE_PROVIDER_SITE_OTHER): Payer: Self-pay

## 2016-03-17 VITALS — BP 144/66 | HR 68 | Ht 66.0 in | Wt 141.0 lb

## 2016-03-17 DIAGNOSIS — I739 Peripheral vascular disease, unspecified: Secondary | ICD-10-CM

## 2016-03-17 DIAGNOSIS — I4892 Unspecified atrial flutter: Secondary | ICD-10-CM

## 2016-03-17 DIAGNOSIS — I779 Disorder of arteries and arterioles, unspecified: Secondary | ICD-10-CM

## 2016-03-17 DIAGNOSIS — E7800 Pure hypercholesterolemia, unspecified: Secondary | ICD-10-CM | POA: Diagnosis not present

## 2016-03-17 DIAGNOSIS — I10 Essential (primary) hypertension: Secondary | ICD-10-CM | POA: Diagnosis not present

## 2016-03-17 NOTE — Assessment & Plan Note (Signed)
History of atrial flutter during hospitalization back in the fall which converted to sinus rhythm. That time her EF was in the 25% range. She has no symptoms of heart failure. She is in sinus rhythm today. It was elected not to place her on oral anticoagulation because of history of GI bleed. We'll recheck a 2-D echocardiogram.

## 2016-03-17 NOTE — Assessment & Plan Note (Signed)
History of carotid artery disease status post bilateral carotid endarterectomies in the past. Her most recent carotid Dopplers performed 07/04/15 revealed an occluded right carotid with moderate left ICA stenosis. We will repeat her carotid Doppler studies in April of this year.

## 2016-03-17 NOTE — Assessment & Plan Note (Signed)
History of hypertension blood pressure measured 144/66. She is on hydralazine and metoprolol. Continue current meds at current dosing

## 2016-03-17 NOTE — Assessment & Plan Note (Signed)
History of abdominal aortic aneurysm status post aortobifemoral bypass grafting.

## 2016-03-17 NOTE — Assessment & Plan Note (Signed)
History of hyperlipidemia on statin therapy with recent lipid profile performed 02/02/16 revealed a total cholesterol 162 and triglyceride level of 264. She does admit to dietary indiscretion.

## 2016-03-17 NOTE — Assessment & Plan Note (Signed)
History of coronary artery bypass grafting X 6 in 1997. Her last Miley performed 11/24/2011 was nonischemic. She denies chest pain or shortness of breath.

## 2016-03-17 NOTE — Assessment & Plan Note (Signed)
History of peripheral arterial disease status post right femoropopliteal pass grafting with Dopplers performed 12/13/12 revealing an occluded femoropopliteal bypass graft, a right ABI 0.38 with an occluded right SFA and left ABI 0.58 with occluded left SFA. The patient does complain of some claudication however because of her comorbidities and moderate renal insufficiency we have decided to treat her medically.

## 2016-03-17 NOTE — Progress Notes (Signed)
03/17/2016 [name] [name]   13-Mar-1940  [id number]  Primary Physician [name], MD Primary Cardiologist: [name] MD [name]  HPI:  The patient is a 76 year old, mildly overweight, divorced Caucasian female, mother of 1 child who I last saw in the office 11/26/15.  She has a history of CAD status post coronary artery bypass grafting x6 in 1997. She has had bilateral carotid endarterectomies, aortobifemoral bypass grafting and right fem-pop bypass grafting with lifestyle-limiting claudication. Cath performed by Dr. [name] December 09, 2009, revealed an occluded vein to an OM and ramus branches with a patent vein to a PDA and a patent LIMA to the LAD. Her EF was 40% at that time. She quit smoking November 2011. She had an echo performed March 25, 2010, that showed normal LV systolic function and a Myoview performed November 24, 2011, which was nonischemic. She denies chest pain or shortness of breath. Her last lower extremity Dopplers performed in our office October 05, 2011, revealed a right ABI of 0.45 with an occluded fem-pop bypass graft and a left ABI of 0.59 with an occluded left SFA.since I saw her back 7 months ago she's remained clinically stable. She denies chest pain or shortness of breath. Her lower extremity arterial Dopplers show progression of disease on the right but though I do not think she is revascularizable given her comorbidities. She denies claudication. She was recently admitted with cholecystitis and underwent laparoscopic cholecystectomy. She is slowly recovering from this. A 2-D echocardiogram revealed an ejection fraction of 55% performed 08/29/15. She was hospitalized 01/23/16 with acute congestive heart failure and a flutter converted to sinus rhythm. EF at that time was 20-25%. He was elected not to anticoagulate her because of history of GI bleed nor to pursue cardiac catheterization. Since that time she denies chest pain or shortness of  breath.    Current Outpatient Prescriptions  Medication Sig Dispense Refill  . aspirin 325 MG tablet Take 650 mg by mouth as needed for moderate pain or headache.    Marland Kitchen aspirin 81 MG chewable tablet Chew 81 mg by mouth daily.     . calcium-vitamin D (OSCAL WITH D) 500-200 MG-UNIT per tablet Take 1 tablet by mouth every morning.    . Cranberry 250 MG TABS Take 250 mg by mouth daily.     . Cyanocobalamin (VITAMIN B 12 PO) Take 1 tablet by mouth daily.     . ferrous sulfate 325 (65 FE) MG tablet Take 325 mg by mouth every evening.     . furosemide (LASIX) 20 MG tablet Take 20 mg by mouth every other day.    . hydrALAZINE (APRESOLINE) 10 MG tablet Take 1 tablet (10 mg total) by mouth every 8 (eight) hours. 90 tablet 0  . HYDROcodone-acetaminophen (NORCO/VICODIN) 5-325 MG tablet Take 1 tablet every 12 hours as needed for back pain 60 tablet 0  . insulin aspart (NOVOLOG) 100 UNIT/ML FlexPen Inject 8 units three times a day with meals (Patient taking differently: 6 Units. Inject 6 units three times a day with meals) 15 mL 5  . Insulin Pen Needle (PEN NEEDLES) 31G X 6 MM MISC Use to inject insulin 4 times per day. 150 each 2  . isosorbide mononitrate (IMDUR) 30 MG 24 hr tablet Take 1 tablet (30 mg total) by mouth daily. 30 tablet 0  . latanoprost (XALATAN) 0.005 % ophthalmic solution Place 1 drop into both eyes at bedtime.  0  . LEVEMIR FLEXTOUCH 100  UNIT/ML Pen INJECT 22 UNITS AT BEDTIME 30 mL 1  . levothyroxine (SYNTHROID, LEVOTHROID) 125 MCG tablet Take 125 mcg by mouth daily before breakfast.    . methocarbamol (ROBAXIN) 500 MG tablet Take 1 tablet (500 mg total) by mouth 3 (three) times daily. As needed 90 tablet 0  . metoprolol succinate (TOPROL-XL) 25 MG 24 hr tablet Take 1 tablet (25 mg total) by mouth daily. 30 tablet 0  . omeprazole (PRILOSEC) 20 MG capsule TAKE 1 CAPSULE DAILY 90 capsule 3  . polyethylene glycol (MIRALAX / GLYCOLAX) packet Take 17 g by mouth daily as needed for moderate  constipation. 14 each 0  . rosuvastatin (CRESTOR) 20 MG tablet Take 0.5 tablets (10 mg total) by mouth daily. 30 tablet 0  . VENTOLIN HFA 108 (90 Base) MCG/ACT inhaler inhale 2 puffs by mouth INTO THE LUNGS every 6 hours if needed for wheezing 18 Inhaler 4  . vitamin C (ASCORBIC ACID) 500 MG tablet Take 500 mg by mouth at bedtime.     . Vitamin D, Ergocalciferol, (DRISDOL) 50000 units CAPS capsule TAKE 1 CAPSULE BY MOUTH EVERY 14 DAYS 8 capsule 3   No current facility-administered medications for this visit.     No Known Allergies  Social History   Social History  . Marital status: Divorced    Spouse name: N/A  . Number of children: 1  . Years of education: 64   Occupational History  . Not on file.   Social History Main Topics  . Smoking status: Former Smoker    Packs/day: 2.00    Years: 30.00    Quit date: 06/07/2009  . Smokeless tobacco: Never Used  . Alcohol use 1.8 oz/week    3 Standard drinks or equivalent per week     Comment: ocassional  . Drug use: No  . Sexual activity: Not on file   Other Topics Concern  . Not on file   Social History Narrative   Lives alone   Worked 71 yrs [employer]   1 daughter     Review of Systems: General: negative for chills, fever, night sweats or weight changes.  Cardiovascular: negative for chest pain, dyspnea on exertion, edema, orthopnea, palpitations, paroxysmal nocturnal dyspnea or shortness of breath Dermatological: negative for rash Respiratory: negative for cough or wheezing Urologic: negative for hematuria Abdominal: negative for nausea, vomiting, diarrhea, bright red blood per rectum, melena, or hematemesis Neurologic: negative for visual changes, syncope, or dizziness All other systems reviewed and are otherwise negative except as noted above.    Blood pressure (!) 144/66, pulse 68, height 5\' 6"  (1.676 m), weight 141 lb (64 kg).  General appearance: alert and no distress Neck: no adenopathy, no JVD, supple,  symmetrical, trachea midline, thyroid not enlarged, symmetric, no tenderness/mass/nodules and Soft left carotid bruit Lungs: clear to auscultation bilaterally Heart: regular rate and rhythm, S1, S2 normal, no murmur, click, rub or gallop Extremities: extremities normal, atraumatic, no cyanosis or edema  EKG sinus rhythm 68 with nonspecific ST and T-wave changes and occasional PVC. I personally. Reviewed this EKG  ASSESSMENT AND PLAN:   Hx of CABG '97 History of coronary artery bypass grafting X 6 in 1997. Her last [place] performed 11/24/2011 was nonischemic. She denies chest pain or shortness of breath.  HTN (hypertension) History of hypertension blood pressure measured 144/66. She is on hydralazine and metoprolol. Continue current meds at current dosing  AAA (abdominal aortic aneurysm) (HCC) History of abdominal aortic aneurysm status post aortobifemoral bypass  grafting.  Claudication in peripheral vascular disease ([place]) History of peripheral arterial disease status post right femoropopliteal pass grafting with Dopplers performed 12/13/12 revealing an occluded femoropopliteal bypass graft, a right ABI 0.38 with an occluded right SFA and left ABI 0.58 with occluded left SFA. The patient does complain of some claudication however because of her comorbidities and moderate renal insufficiency we have decided to treat her medically.  Carotid artery disease ([place]) History of carotid artery disease status post bilateral carotid endarterectomies in the past. Her most recent carotid Dopplers performed 07/04/15 revealed an occluded right carotid with moderate left ICA stenosis. We will repeat her carotid Doppler studies in April of this year.  Hyperlipidemia History of hyperlipidemia on statin therapy with recent lipid profile performed 02/02/16 revealed a total cholesterol 162 and triglyceride level of 264. She does admit to dietary indiscretion.  Atrial flutter ([place]) History of atrial flutter during  hospitalization back in the fall which converted to sinus rhythm. That time her EF was in the 25% range. She has no symptoms of heart failure. She is in sinus rhythm today. It was elected not to place her on oral anticoagulation because of history of GI bleed. We'll recheck a 2-D echocardiogram.      [name] MD [facility], [facility] 03/17/2016 2:58 PM

## 2016-03-17 NOTE — Patient Instructions (Addendum)
Medication Instructions: Your physician recommends that you continue on your current medications as directed. Please refer to the Current Medication list given to you today.   Testing/Procedures: Schedule Carotid dopplers---April 2018  Your physician has requested that you have an echocardiogram. Echocardiography is a painless test that uses sound waves to create images of your heart. It provides your doctor with information about the size and shape of your heart and how well your heart's chambers and valves are working. This procedure takes approximately one hour. There are no restrictions for this procedure.  Follow-Up: We request that you follow-up in: 6 months with Kerin Ransom, PA-C and in 12 months with Dr Andria Rhein will receive a reminder letter in the mail two months in advance. If you don't receive a letter, please call our office to schedule the follow-up appointment.  If you need a refill on your cardiac medications before your next appointment, please call your pharmacy.

## 2016-03-19 ENCOUNTER — Other Ambulatory Visit (INDEPENDENT_AMBULATORY_CARE_PROVIDER_SITE_OTHER): Payer: Medicare Other

## 2016-03-19 ENCOUNTER — Telehealth: Payer: Self-pay | Admitting: Endocrinology

## 2016-03-19 DIAGNOSIS — N184 Chronic kidney disease, stage 4 (severe): Secondary | ICD-10-CM | POA: Diagnosis not present

## 2016-03-19 LAB — BASIC METABOLIC PANEL
BUN: 42 mg/dL — ABNORMAL HIGH (ref 6–23)
CO2: 25 meq/L (ref 19–32)
Calcium: 8.9 mg/dL (ref 8.4–10.5)
Chloride: 108 mEq/L (ref 96–112)
Creatinine, Ser: 1.68 mg/dL — ABNORMAL HIGH (ref 0.40–1.20)
GFR: 31.57 mL/min — ABNORMAL LOW (ref >60.00)
GLUCOSE: 107 mg/dL — AB (ref 70–99)
POTASSIUM: 4.4 meq/L (ref 3.5–5.1)
SODIUM: 139 meq/L (ref 135–145)

## 2016-03-19 NOTE — Telephone Encounter (Signed)
Pt is coming in for her labs today and wants to know if she can pick up a prescription for her Hydrocodone when she comes in.

## 2016-03-22 ENCOUNTER — Encounter: Payer: Self-pay | Admitting: Endocrinology

## 2016-03-22 ENCOUNTER — Ambulatory Visit (INDEPENDENT_AMBULATORY_CARE_PROVIDER_SITE_OTHER): Payer: Medicare Other | Admitting: Endocrinology

## 2016-03-22 VITALS — BP 108/40 | HR 66 | Ht 66.0 in | Wt 139.0 lb

## 2016-03-22 DIAGNOSIS — E119 Type 2 diabetes mellitus without complications: Secondary | ICD-10-CM

## 2016-03-22 DIAGNOSIS — E038 Other specified hypothyroidism: Secondary | ICD-10-CM

## 2016-03-22 DIAGNOSIS — I5030 Unspecified diastolic (congestive) heart failure: Secondary | ICD-10-CM | POA: Diagnosis not present

## 2016-03-22 DIAGNOSIS — E782 Mixed hyperlipidemia: Secondary | ICD-10-CM

## 2016-03-22 DIAGNOSIS — E063 Autoimmune thyroiditis: Secondary | ICD-10-CM

## 2016-03-22 DIAGNOSIS — E559 Vitamin D deficiency, unspecified: Secondary | ICD-10-CM

## 2016-03-22 DIAGNOSIS — N289 Disorder of kidney and ureter, unspecified: Secondary | ICD-10-CM

## 2016-03-22 MED ORDER — HYDROCODONE-ACETAMINOPHEN 5-325 MG PO TABS: ORAL_TABLET | ORAL | 0 refills | Status: DC

## 2016-03-22 NOTE — Patient Instructions (Addendum)
Hydralazine 2x daily  Crestor take 1 tab

## 2016-03-22 NOTE — Telephone Encounter (Signed)
Rx printed and given to Dr. Dwyane Dee to sign so the patient can pick up during her 115 pm appointment today.

## 2016-03-22 NOTE — Progress Notes (Signed)
Patient ID: [name] [name], female   DOB: 01-14-1941, 76 y.o.   MRN: [id number]   Reason for Appointment: Follow-up of various problems  History of Present Illness    RENAL dysfunction:  Renal function is variable Lasix was reduced to every other day because of relatively high creatinine in 12/17 No recurrence of hyperkalemia   Lab Results  Component Value Date   CREATININE 1.68 (H) 03/19/2016   CREATININE 1.38 (H) 03/10/2016   CREATININE 1.60 (H) 02/13/2016   CREATININE 2.13 (H) 02/09/2016   Lab Results  Component Value Date   CREATININE 1.68 (H) 03/19/2016   BUN 42 (H) 03/19/2016   NA 139 03/19/2016   K 4.4 03/19/2016   CL 108 03/19/2016   CO2 25 03/19/2016      CHF: She was admitted with shortness of breath and orthopnea on 01/23/16 She has a low ejection fraction of 20-25 % and is  on hydralazine 3 times a day along with 20 mg Lasix every second day Recently seen by a cardiologist Also she had atrial flutter which has resolved   She does not feel out of breath during the day and can lie down comfortably   Type 2 DIABETES mellitus, date of diagnosis: 1986.   The insulin regimen is: Levemir 18 units hs, Novolog 8 ac twice a day   Type 2 diabetes  has been treated in the last few years with low dose basal bolus insulin regimen. She has not been taking  any oral hypoglycemic drugs including metformin partly because of GI side effects  A1c is fairly good at 6.0 done in November  Blood sugars from review of monitor showed that her fasting readings are usually consistent Has some readings in the evenings before and after supper Has only a couple of relatively high readings over 200, she thinks these were right after eating Also no hypoglycemia Currently not doing any activity except a little walking Weight is about the same  Side effects from medications: Diarrhea from metformin and nausea and vomiting from GLP-1 drugs   Monitors blood glucose:  Less than 1 times a day  Glucometer: One Touch.  Readings not available   Meals: she is usually eating low fat meals especially recently meals usually at 11 AM,and supper at 5 pm.  Calorie intake: Usually controlled. Moderate Carbs Physical activity: exercise: Minimal  Dietician visit: Most recent:, 5/13.   Wt Readings from Last 3 Encounters:  03/22/16 139 lb (63 kg)  03/17/16 141 lb (64 kg)  02/13/16 138 lb 6 oz (62.8 kg)    Lab Results  Component Value Date   HGBA1C 6.0 (H) 01/23/2016   HGBA1C 5.4 11/27/2015   HGBA1C 5.6 08/29/2015   Lab Results  Component Value Date   MICROALBUR 24.7 (H) 02/02/2016   LDLCALC 49 09/13/2013   CREATININE 1.68 (H) 03/19/2016    ANEMIA:   This was worse when she had GI bleeding and Plavix was stopped Hemoglobin is improving and is taking iron regularly She is currently on 81 mg aspirin    Lab Results  Component Value Date   WBC 8.3 03/10/2016   HGB 11.0 (L) 03/10/2016   HCT 35.8 03/10/2016   MCV 90.2 03/10/2016   PLT 213 03/10/2016      HYPOTHYROIDISM: She has had long-standing primary hypothyroidism TSH has been more stable lately She is taking Levothyroxine, 125 g dose, does take it regularly in the morning No complaints of unusual fatigue   Lab Results  Component Value Date   TSH 2.04 02/02/2016   TSH 4.33 11/27/2015   TSH 0.496 08/29/2015   FREET4 1.26 02/02/2016   FREET4 0.91 11/27/2015   FREET4 1.09 06/30/2015    OTHER active problems: See review of systems    Allergies as of 03/22/2016   No Known Allergies     Medication List       Accurate as of 03/22/16  8:11 PM. Always use your most recent med list.          aspirin 325 MG tablet Take 650 mg by mouth as needed for moderate pain or headache.   aspirin 81 MG chewable tablet Chew 81 mg by mouth daily.   calcium-vitamin D 500-200 MG-UNIT tablet Commonly known as:  OSCAL WITH D Take 1 tablet by mouth every morning.   Cranberry 250 MG  Tabs Take 250 mg by mouth daily.   ferrous sulfate 325 (65 FE) MG tablet Take 325 mg by mouth every evening.   furosemide 20 MG tablet Commonly known as:  LASIX Take 20 mg by mouth every other day.   hydrALAZINE 10 MG tablet Commonly known as:  APRESOLINE Take 1 tablet (10 mg total) by mouth every 8 (eight) hours.   HYDROcodone-acetaminophen 5-325 MG tablet Commonly known as:  NORCO/VICODIN Take 1 tablet every 12 hours as needed for back pain   insulin aspart 100 UNIT/ML FlexPen Commonly known as:  NOVOLOG Inject 8 units three times a day with meals   isosorbide mononitrate 30 MG 24 hr tablet Commonly known as:  IMDUR Take 1 tablet (30 mg total) by mouth daily.   latanoprost 0.005 % ophthalmic solution Commonly known as:  XALATAN Place 1 drop into both eyes at bedtime.   LEVEMIR FLEXTOUCH 100 UNIT/ML Pen Generic drug:  Insulin Detemir INJECT 22 UNITS AT BEDTIME   levothyroxine 125 MCG tablet Commonly known as:  SYNTHROID, LEVOTHROID Take 125 mcg by mouth daily before breakfast.   methocarbamol 500 MG tablet Commonly known as:  ROBAXIN Take 1 tablet (500 mg total) by mouth 3 (three) times daily. As needed   metoprolol succinate 25 MG 24 hr tablet Commonly known as:  TOPROL-XL Take 1 tablet (25 mg total) by mouth daily.   omeprazole 20 MG capsule Commonly known as:  PRILOSEC TAKE 1 CAPSULE DAILY   Pen Needles 31G X 6 MM Misc Use to inject insulin 4 times per day.   polyethylene glycol packet Commonly known as:  MIRALAX / GLYCOLAX Take 17 g by mouth daily as needed for moderate constipation.   rosuvastatin 20 MG tablet Commonly known as:  CRESTOR Take 0.5 tablets (10 mg total) by mouth daily.   VENTOLIN HFA 108 (90 Base) MCG/ACT inhaler Generic drug:  albuterol inhale 2 puffs by mouth INTO THE LUNGS every 6 hours if needed for wheezing   VITAMIN B 12 PO Take 1 tablet by mouth daily.   vitamin C 500 MG tablet Commonly known as:  ASCORBIC ACID Take 500  mg by mouth at bedtime.   Vitamin D (Ergocalciferol) 50000 units Caps capsule Commonly known as:  DRISDOL TAKE 1 CAPSULE BY MOUTH EVERY 14 DAYS       Allergies: No Known Allergies  Past Medical History:  Diagnosis Date  . AAA (abdominal aortic aneurysm) ([place])   . Bilateral hydronephrosis   . Carotid artery stenosis    carotid doppler 06/2012 - Right CCA/Bulb/ICA with chronic occlusion; L vertebral artery with abnormal blood flow; L Bulb/Prox ICA  s/p endarterectomy  with mild fibrous plaque, 50% diameter reduction  . CHF (congestive heart failure) ([place])   . Choledocholithiasis 2017  . CKD (chronic kidney disease), stage III   . Coronary artery disease due to lipid rich plaque cardiologist-  dr berry   s/p CABG x6 1997-- cath 12-09-2009 occluded vein to obtuse marginal branch and ramus branch with patent vien to PDA and patent LIMA to LAD, ef 40%-- Myoview 11-24-2011, nonischemic  . Diverticulosis   . Dyspnea on exertion   . GERD (gastroesophageal reflux disease)   . GI bleed   . History of sepsis    10-18-2014 w/ acute pyelonephritis  . Hyperlipidemia   . Hypertension   . Hypothyroidism   . Ischemic cardiomyopathy    03-25-2010-- per lasts echo EF  50-55%  . PAD (peripheral artery disease) ([place])    09/2010 LEAs - R ABI of 0.45, occluded fem-pop bypass graft, L ABI of 0.59 with occluded SFA; severe arterial insuff  . PVD (peripheral vascular disease) with claudication ([place])    last duplex 07-04-2015 -- Right CCA and ICA chronic occlusion, 94-50% LICA, Patent vertebral arteries w/ antegrade flow, bilateral normal subclavian arteries   . Retroperitoneal fibrosis   . Tubular adenoma of colon   . Type 2 diabetes mellitus ([place])    monitored by dr Dwyane Dee    Past Surgical History:  Procedure Laterality Date  . AORTA - BILATERAL FEMORAL ARTERY BYPASS GRAFT  1997   and RIGHT FEM-POP   . CARDIAC CATHETERIZATION  12-09-2009  dr al little   EF >40%-- occluded vein to OM & ramus  branches, patent vein to PDA, patent LIMA to LAD (Dr. [name], [facility]) - later had thrombectomy of R fem-pop bypass ad R common femoral & profunda femoris artery (Dr. Oneida Alar)  . CARDIOVASCULAR STRESS TEST  11-24-2011   dr berry   Low Risk study: fixed basal to mid inferior attenuation artifact, no reversible ischemia,  normal LV function and wall motion , ef 67%  . CAROTID ENDARTERECTOMY Bilateral right 1994//  left ?  Marland Kitchen CATARACT EXTRACTION W/ INTRAOCULAR LENS  IMPLANT, BILATERAL  2006  . CHOLECYSTECTOMY N/A 09/05/2015   Procedure: ATTEMPTED LAPAROSCOPIC CHOLECYSTECTOMY, EXPLORATORY LAPAROTOMY WITH CHOLECYSTECTOMY;  Surgeon: Autumn Messing III, MD;  Location: [facility];  Service: General;  Laterality: N/A;  . COLONOSCOPY    . COLONOSCOPY  10/21/2015  . COLONOSCOPY WITH PROPOFOL N/A 10/21/2015   Procedure: COLONOSCOPY WITH PROPOFOL;  Surgeon: Milus Banister, MD;  Location: [facility];  Service: Endoscopy;  Laterality: N/A;  . CORONARY ARTERY BYPASS GRAFT  1997   x6; internal mammary to LAD, SVG to ramus #1 & #2, SVG to OM, SVG to PDA,   . CYSTOSCOPY W/ RETROGRADES Right 08/01/2015   Procedure: CYSTOSCOPY WITH RETROGRADE PYELOGRAM;  Surgeon: Ardis Hughs, MD;  Location: [facility];  Service: Urology;  Laterality: Right;  . CYSTOSCOPY W/ URETERAL STENT PLACEMENT  03/10/2012   Procedure: CYSTOSCOPY WITH RETROGRADE PYELOGRAM/URETERAL STENT PLACEMENT;  Surgeon: Hanley Ben, MD;  Location: [facility];  Service: Urology;  Laterality: Left;  . CYSTOSCOPY W/ URETERAL STENT PLACEMENT Bilateral 03/07/2015   Procedure: BILATERAL RETROGRADE PYELOGRAM AND RIGHT URETERAL STENT PLACEMENT;  Surgeon: Ardis Hughs, MD;  Location: [facility];  Service: Urology;  Laterality: Bilateral;  . CYSTOSCOPY W/ URETERAL STENT PLACEMENT Right 08/01/2015   Procedure: CYSTOSCOPY WITH STENT REPLACEMENT;  Surgeon: Ardis Hughs, MD;  Location: [facility];  Service: Urology;   Laterality: Right;  .  CYSTOSCOPY W/ URETERAL STENT PLACEMENT Right 02/13/2016   Procedure: RIGHT URETERAL STENT EXCHANGE;  Surgeon: Ardis Hughs, MD;  Location: [facility];  Service: Urology;  Laterality: Right;  . ERCP N/A 09/03/2015   Procedure: ENDOSCOPIC RETROGRADE CHOLANGIOPANCREATOGRAPHY (ERCP);  Surgeon: Irene Shipper, MD;  [facility];  Service: Endoscopy;  Laterality: N/A;  . ESOPHAGOGASTRODUODENOSCOPY    . ESOPHAGOGASTRODUODENOSCOPY N/A 10/19/2015   Procedure: ESOPHAGOGASTRODUODENOSCOPY (EGD);  Surgeon: Gatha Mayer, MD;  Location: [facility];  Service: Endoscopy;  Laterality: N/A;  . GIVENS CAPSULE STUDY  10/21/2015  . GIVENS CAPSULE STUDY N/A 10/21/2015   Procedure: GIVENS CAPSULE STUDY;  Surgeon: Milus Banister, MD;  Location: [facility];  Service: Endoscopy;  Laterality: N/A;  . REPAIR RIGHT FEMORAL PSEUDOANEUYSM/  RIGHT FEM-POP BYPASS GRAFT/  DEBRIDEMENT RIGHT LOWER EXTREMITIY VENOUS STATUS ULCERS X2  01-12-2005  . TOTAL ABDOMINAL HYSTERECTOMY W/ BILATERAL SALPINGOOPHORECTOMY  1986  . TRANSTHORACIC ECHOCARDIOGRAM  03/25/2010   EF 86-76%, LV systolic function low normal with mild inferoseptal hypocontractility; LA mildly dilated; mod MR; mild TR, RV systolic pressure elevated, mild pulm HTN; AV mildly sclerotic; mild pulm valve regurg; aortic root sclerosis/calcif     Family History  Problem Relation Age of Onset  . Congestive Heart Failure Mother   . Diabetes Mother   . Stroke Father   . Cancer Maternal Aunt     Breast cancer    Social History:  reports that she quit smoking about 6 years ago. She has a 60.00 pack-year smoking history. She has never used smokeless tobacco. She reports that she drinks about 1.8 oz of alcohol per week . She reports that she does not use drugs.  Review of Systems -     Hyperlipidemia: Has history of high LDL and triglycerides/low HDL  Was changed from Lipitor to Crestor because of higher LDL and she was not wanting to pay the  higher cost of adding Zetia   LDL is Controlled, her Crestor was reduced to half a tablet because of renal dysfunction in the hospital   Tends to have high triglycerides also despite taking fenofibrate   Lab Results  Component Value Date   CHOL 162 02/02/2016   HDL 30.80 (L) 02/02/2016   LDLCALC 49 09/13/2013   LDLDIRECT 69.0 02/02/2016   TRIG 264.0 (H) 02/02/2016   CHOLHDL 5 02/02/2016     Vitamin D deficiency: She has been on supplements and taking her 50,000 units every other week based on her last level  Still has on and off low back pain which is chronic    Examination:   BP (!) 108/40 (BP Location: Left Arm)   Pulse 66   Ht 5\' 6"  (1.676 m)   Wt 139 lb (63 kg)   LMP  (Exact Date)   SpO2 92%   BMI 22.44 kg/m   Body mass index is 22.44 kg/m.    Initial blood pressure was 720 systolic No pedal edema present    Assesment/PLAN:   CHF: This may have been precipitated by her atrial fibrillation She is on Lasix, hydralazine and metoprolol Clinically doing well, no changes made by cardiologist recently Symptomatically doing well even with taking Lasix every other day and will continue She can reduce hydralazine to twice a day because of low-normal blood pressure and increase in creatinine  RENAL insufficiency: Likely to be from vascular insufficiency and glomerular sclerosis No history of diabetic nephropathy Her creatinine is slightly higher compared to a few days ago and may be  related to low normal blood pressure She can reduce hydralazine to twice a day, she is also able to comply with this better  ANEMIA: last hemoglobin stable at 10.9 She will continue on supplements with iron  HYPERLIPIDEMIA: She needs to be on high-dose statin because of her risk factors, Crestor was reduced when her renal function was worse but improved renal function she can go back to 20 mg daily  DIABETES: Blood sugars have  been mostly well-controlled, including after meals She will  continue her  current basal bolus insulin with which she is quite compliant Discussed checking blood sugars at various times and blood sugar targets Again encouraged her to be more active now that she is feeling better  Will check A1c on the next visit  HYPOTHYROIDISM: Her TSH has been normal with current regimen of 125 levothyroxine and will recheck on the next visit  Encouraged her to be checking her feet regularly   Total visit time for evaluation and management of multiple problems, review of hospital records, labs = 25 minutes   [facility]  03/22/16

## 2016-03-23 ENCOUNTER — Emergency Department (HOSPITAL_COMMUNITY): Payer: Medicare Other

## 2016-03-23 ENCOUNTER — Encounter (HOSPITAL_COMMUNITY): Payer: Self-pay

## 2016-03-23 ENCOUNTER — Inpatient Hospital Stay (HOSPITAL_COMMUNITY)
Admission: EM | Admit: 2016-03-23 | Discharge: 2016-03-25 | DRG: 690 | Disposition: A | Discharge status: Home Health | Payer: Medicare Other | Attending: Internal Medicine | Admitting: Internal Medicine

## 2016-03-23 ENCOUNTER — Telehealth: Payer: Self-pay | Admitting: Endocrinology

## 2016-03-23 ENCOUNTER — Observation Stay (HOSPITAL_COMMUNITY): Payer: Medicare Other

## 2016-03-23 DIAGNOSIS — Z951 Presence of aortocoronary bypass graft: Secondary | ICD-10-CM

## 2016-03-23 DIAGNOSIS — M25562 Pain in left knee: Secondary | ICD-10-CM | POA: Diagnosis present

## 2016-03-23 DIAGNOSIS — M25561 Pain in right knee: Secondary | ICD-10-CM | POA: Diagnosis not present

## 2016-03-23 DIAGNOSIS — I5042 Chronic combined systolic (congestive) and diastolic (congestive) heart failure: Secondary | ICD-10-CM | POA: Diagnosis not present

## 2016-03-23 DIAGNOSIS — N390 Urinary tract infection, site not specified: Secondary | ICD-10-CM | POA: Diagnosis not present

## 2016-03-23 DIAGNOSIS — K219 Gastro-esophageal reflux disease without esophagitis: Secondary | ICD-10-CM | POA: Diagnosis present

## 2016-03-23 DIAGNOSIS — R319 Hematuria, unspecified: Secondary | ICD-10-CM | POA: Diagnosis present

## 2016-03-23 DIAGNOSIS — S8991XA Unspecified injury of right lower leg, initial encounter: Secondary | ICD-10-CM | POA: Diagnosis not present

## 2016-03-23 DIAGNOSIS — E1122 Type 2 diabetes mellitus with diabetic chronic kidney disease: Secondary | ICD-10-CM | POA: Diagnosis not present

## 2016-03-23 DIAGNOSIS — Z7982 Long term (current) use of aspirin: Secondary | ICD-10-CM

## 2016-03-23 DIAGNOSIS — I13 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 1 through stage 4 chronic kidney disease, or unspecified chronic kidney disease: Secondary | ICD-10-CM | POA: Diagnosis not present

## 2016-03-23 DIAGNOSIS — N189 Chronic kidney disease, unspecified: Secondary | ICD-10-CM | POA: Diagnosis present

## 2016-03-23 DIAGNOSIS — N179 Acute kidney failure, unspecified: Secondary | ICD-10-CM | POA: Diagnosis present

## 2016-03-23 DIAGNOSIS — W1830XA Fall on same level, unspecified, initial encounter: Secondary | ICD-10-CM | POA: Diagnosis present

## 2016-03-23 DIAGNOSIS — Z79899 Other long term (current) drug therapy: Secondary | ICD-10-CM

## 2016-03-23 DIAGNOSIS — E119 Type 2 diabetes mellitus without complications: Secondary | ICD-10-CM

## 2016-03-23 DIAGNOSIS — N183 Chronic kidney disease, stage 3 (moderate): Secondary | ICD-10-CM | POA: Diagnosis present

## 2016-03-23 DIAGNOSIS — W19XXXA Unspecified fall, initial encounter: Secondary | ICD-10-CM | POA: Diagnosis present

## 2016-03-23 DIAGNOSIS — N3000 Acute cystitis without hematuria: Secondary | ICD-10-CM | POA: Diagnosis not present

## 2016-03-23 DIAGNOSIS — E860 Dehydration: Secondary | ICD-10-CM | POA: Diagnosis present

## 2016-03-23 DIAGNOSIS — Z9841 Cataract extraction status, right eye: Secondary | ICD-10-CM

## 2016-03-23 DIAGNOSIS — Z794 Long term (current) use of insulin: Secondary | ICD-10-CM

## 2016-03-23 DIAGNOSIS — M25571 Pain in right ankle and joints of right foot: Secondary | ICD-10-CM

## 2016-03-23 DIAGNOSIS — Z961 Presence of intraocular lens: Secondary | ICD-10-CM | POA: Diagnosis present

## 2016-03-23 DIAGNOSIS — Z9842 Cataract extraction status, left eye: Secondary | ICD-10-CM

## 2016-03-23 DIAGNOSIS — E1151 Type 2 diabetes mellitus with diabetic peripheral angiopathy without gangrene: Secondary | ICD-10-CM | POA: Diagnosis not present

## 2016-03-23 DIAGNOSIS — Y92512 Supermarket, store or market as the place of occurrence of the external cause: Secondary | ICD-10-CM

## 2016-03-23 DIAGNOSIS — Z87891 Personal history of nicotine dependence: Secondary | ICD-10-CM

## 2016-03-23 DIAGNOSIS — Z466 Encounter for fitting and adjustment of urinary device: Secondary | ICD-10-CM | POA: Diagnosis not present

## 2016-03-23 DIAGNOSIS — Z95828 Presence of other vascular implants and grafts: Secondary | ICD-10-CM

## 2016-03-23 DIAGNOSIS — E039 Hypothyroidism, unspecified: Secondary | ICD-10-CM | POA: Diagnosis present

## 2016-03-23 DIAGNOSIS — S8992XA Unspecified injury of left lower leg, initial encounter: Secondary | ICD-10-CM | POA: Diagnosis not present

## 2016-03-23 LAB — CBC
HCT: 33 % — ABNORMAL LOW (ref 36.0–46.0)
Hemoglobin: 10.5 g/dL — ABNORMAL LOW (ref 12.0–15.0)
MCH: 28.3 pg (ref 26.0–34.0)
MCHC: 31.8 g/dL (ref 30.0–36.0)
MCV: 88.9 fL (ref 78.0–100.0)
PLATELETS: 194 10*3/uL (ref 150–400)
RBC: 3.71 MIL/uL — ABNORMAL LOW (ref 3.87–5.11)
RDW: 15.3 % (ref 11.5–15.5)
WBC: 9 10*3/uL (ref 4.0–10.5)

## 2016-03-23 LAB — URINALYSIS, ROUTINE W REFLEX MICROSCOPIC
BILIRUBIN URINE: NEGATIVE
Glucose, UA: NEGATIVE mg/dL
Ketones, ur: NEGATIVE mg/dL
NITRITE: NEGATIVE
PH: 5 (ref 5.0–8.0)
Protein, ur: 100 mg/dL — AB
SPECIFIC GRAVITY, URINE: 1.013 (ref 1.005–1.030)

## 2016-03-23 LAB — BASIC METABOLIC PANEL
Anion gap: 14 (ref 5–15)
BUN: 53 mg/dL — AB (ref 6–20)
CO2: 21 mmol/L — ABNORMAL LOW (ref 22–32)
CREATININE: 2.03 mg/dL — AB (ref 0.44–1.00)
Calcium: 8.9 mg/dL (ref 8.9–10.3)
Chloride: 106 mmol/L (ref 101–111)
GFR calc Af Amer: 27 mL/min — ABNORMAL LOW (ref >60)
GFR, EST NON AFRICAN AMERICAN: 23 mL/min — AB (ref >60)
Glucose, Bld: 96 mg/dL (ref 65–99)
Potassium: 4.5 mmol/L (ref 3.5–5.1)
Sodium: 141 mmol/L (ref 135–145)

## 2016-03-23 LAB — I-STAT TROPONIN, ED: TROPONIN I, POC: 0 ng/mL (ref 0.00–0.08)

## 2016-03-23 MED ORDER — SODIUM CHLORIDE 0.9 % IV SOLN
Freq: Once | INTRAVENOUS | Status: AC
  Administered 2016-03-23: 23:00:00 via INTRAVENOUS

## 2016-03-23 MED ORDER — DEXTROSE 5 % IV SOLN
1.0000 g | Freq: Once | INTRAVENOUS | Status: AC
  Administered 2016-03-23: 1 g via INTRAVENOUS
  Filled 2016-03-23: qty 10

## 2016-03-23 MED ORDER — HYDROCODONE-ACETAMINOPHEN 5-325 MG PO TABS
1.0000 | ORAL_TABLET | Freq: Once | ORAL | Status: AC
  Administered 2016-03-23: 1 via ORAL
  Filled 2016-03-23: qty 1

## 2016-03-23 NOTE — ED Notes (Signed)
ED Provider at bedside. 

## 2016-03-23 NOTE — ED Provider Notes (Signed)
Emergency Department Provider Note   I have reviewed the triage vital signs and the nursing notes.   HISTORY  Chief Complaint Fall and Weakness   HPI [name] [name] is a 76 y.o. female with PMH of AAA, CHF, CKD, CAD, GERD, and PVD presents to the emergent permit for evaluation of lower extremity weakness and fall today. The patient was shopping and feel like her legs "gave out" near the end of her shopping. No chest pain, palpitations, or SOB. No lightheadedness. No LOC. No sudden severe HA. Patient denies any recent illness or medication changes. She is currently complaining of pain in bilateral knees and ankles. No head trauma. No vomiting since the fall.   Past Medical History:  Diagnosis Date  . AAA (abdominal aortic aneurysm) ([place])   . Bilateral hydronephrosis   . Carotid artery stenosis    carotid doppler 06/2012 - Right CCA/Bulb/ICA with chronic occlusion; L vertebral artery with abnormal blood flow; L Bulb/Prox ICA  s/p endarterectomy with mild fibrous plaque, 50% diameter reduction  . CHF (congestive heart failure) ([place])   . Choledocholithiasis 2017  . CKD (chronic kidney disease), stage III   . Coronary artery disease due to lipid rich plaque cardiologist-  dr berry   s/p CABG x6 1997-- cath 12-09-2009 occluded vein to obtuse marginal branch and ramus branch with patent vien to PDA and patent LIMA to LAD, ef 40%-- Myoview 11-24-2011, nonischemic  . Diverticulosis   . Dyspnea on exertion   . GERD (gastroesophageal reflux disease)   . GI bleed   . History of sepsis    10-18-2014 w/ acute pyelonephritis  . Hyperlipidemia   . Hypertension   . Hypothyroidism   . Ischemic cardiomyopathy    03-25-2010-- per lasts echo EF  50-55%  . PAD (peripheral artery disease) ([place])    09/2010 LEAs - R ABI of 0.45, occluded fem-pop bypass graft, L ABI of 0.59 with occluded SFA; severe arterial insuff  . PVD (peripheral vascular disease) with claudication ([place])    last duplex  07-04-2015 -- Right CCA and ICA chronic occlusion, 17-51% LICA, Patent vertebral arteries w/ antegrade flow, bilateral normal subclavian arteries   . Retroperitoneal fibrosis   . Tubular adenoma of colon   . Type 2 diabetes mellitus ([place])    monitored by dr Dwyane Dee    Patient Active Problem List   Diagnosis Date Noted  . Fall 03/24/2016  . UTI (urinary tract infection) 03/23/2016  . Atrial flutter ([place]) 01/26/2016  . Acute kidney injury superimposed on chronic kidney disease ([place]) 01/26/2016  . PAD (peripheral artery disease) ([place]) 01/26/2016  . Angiodysplasia of gastrointestinal tract 01/26/2016  . Acute on chronic combined systolic and diastolic CHF  02/58/5277  . Melena   . Acute blood loss anemia   . GI bleed 10/18/2015  . Preop cardiovascular exam 09/04/2015  . Pain   . Weakness   . Abnormal liver function   . Choledocholithiasis   . Nausea without vomiting   . Pressure ulcer 08/29/2015  . Protein-calorie malnutrition, severe 08/29/2015  . Lung nodule   . Sepsis ([place]) 10/17/2014  . Hyperkalemia 10/17/2014  . Acute pyelonephritis 10/16/2014  . Hyperlipidemia 01/08/2014  . Type II or unspecified type diabetes mellitus with renal manifestations, uncontrolled(250.42) 01/10/2013  . Carotid artery disease ([place]) 01/03/2013  . DOE (dyspnea on exertion) 11/20/2012  . Claudication in peripheral vascular disease ([place]) 11/20/2012  . Bradycardia 11/20/2012  . Chronic kidney disease, stage III (moderate) 11/08/2012  . Other and  unspecified hyperlipidemia 10/01/2012  . Hypothyroidism 10/01/2012  . Chronic combined systolic and diastolic congestive heart failure ([place]) 06/08/2012  . Abdominal pain 03/09/2012  . Altered mental status 03/09/2012  . Diabetes mellitus, type II ([place]) 03/09/2012  . Arthritis 03/09/2012  . HTN (hypertension) 03/09/2012  . Acute encephalopathy 03/09/2012  . Normocytic anemia 03/09/2012  . Elevated LFTs 03/09/2012  . AAA (abdominal aortic aneurysm) ([place])  03/09/2012  . UTI (lower urinary tract infection) 03/08/2012  . Hx of CABG '97 03/08/2012    Past Surgical History:  Procedure Laterality Date  . AORTA - BILATERAL FEMORAL ARTERY BYPASS GRAFT  1997   and RIGHT FEM-POP   . CARDIAC CATHETERIZATION  12-09-2009  dr al little   EF >40%-- occluded vein to OM & ramus branches, patent vein to PDA, patent LIMA to LAD (Dr. [name], [facility]) - later had thrombectomy of R fem-pop bypass ad R common femoral & profunda femoris artery (Dr. Oneida Alar)  . CARDIOVASCULAR STRESS TEST  11-24-2011   dr berry   Low Risk study: fixed basal to mid inferior attenuation artifact, no reversible ischemia,  normal LV function and wall motion , ef 67%  . CAROTID ENDARTERECTOMY Bilateral right 1994//  left ?  Marland Kitchen CATARACT EXTRACTION W/ INTRAOCULAR LENS  IMPLANT, BILATERAL  2006  . CHOLECYSTECTOMY N/A 09/05/2015   Procedure: ATTEMPTED LAPAROSCOPIC CHOLECYSTECTOMY, EXPLORATORY LAPAROTOMY WITH CHOLECYSTECTOMY;  Surgeon: Autumn Messing III, MD;  Location: [place];  Service: General;  Laterality: N/A;  . COLONOSCOPY    . COLONOSCOPY  10/21/2015  . COLONOSCOPY WITH PROPOFOL N/A 10/21/2015   Procedure: COLONOSCOPY WITH PROPOFOL;  Surgeon: Milus Banister, MD;  Location: [place];  Service: Endoscopy;  Laterality: N/A;  . CORONARY ARTERY BYPASS GRAFT  1997   x6; internal mammary to LAD, SVG to ramus #1 & #2, SVG to OM, SVG to PDA,   . CYSTOSCOPY W/ RETROGRADES Right 08/01/2015   Procedure: CYSTOSCOPY WITH RETROGRADE PYELOGRAM;  Surgeon: Ardis Hughs, MD;  [facility];  Service: Urology;  Laterality: Right;  . CYSTOSCOPY W/ URETERAL STENT PLACEMENT  03/10/2012   Procedure: CYSTOSCOPY WITH RETROGRADE PYELOGRAM/URETERAL STENT PLACEMENT;  Surgeon: Hanley Ben, MD;  Location: [facility];  Service: Urology;  Laterality: Left;  . CYSTOSCOPY W/ URETERAL STENT PLACEMENT Bilateral 03/07/2015   Procedure: BILATERAL RETROGRADE PYELOGRAM AND RIGHT URETERAL STENT PLACEMENT;   Surgeon: Ardis Hughs, MD;  Location: [facility];  Service: Urology;  Laterality: Bilateral;  . CYSTOSCOPY W/ URETERAL STENT PLACEMENT Right 08/01/2015   Procedure: CYSTOSCOPY WITH STENT REPLACEMENT;  Surgeon: Ardis Hughs, MD;  Location[facility];  Service: Urology;  Laterality: Right;  . CYSTOSCOPY W/ URETERAL STENT PLACEMENT Right 02/13/2016   Procedure: RIGHT URETERAL STENT EXCHANGE;  Surgeon: Ardis Hughs, MD;  Location: [facility];  Service: Urology;  Laterality: Right;  . ERCP N/A 09/03/2015   Procedure: ENDOSCOPIC RETROGRADE CHOLANGIOPANCREATOGRAPHY (ERCP);  Surgeon: Irene Shipper, MD;  Location[facility];  Service: Endoscopy;  Laterality: N/A;  . ESOPHAGOGASTRODUODENOSCOPY    . ESOPHAGOGASTRODUODENOSCOPY N/A 10/19/2015   Procedure: ESOPHAGOGASTRODUODENOSCOPY (EGD);  Surgeon: Gatha Mayer, MD;  Location: [facility] ENDOSCOPY;  Service: Endoscopy;  Laterality: N/A;  . GIVENS CAPSULE STUDY  10/21/2015  . GIVENS CAPSULE STUDY N/A 10/21/2015   Procedure: GIVENS CAPSULE STUDY;  Surgeon: Milus Banister, MD;  Location: [place];  Service: Endoscopy;  Laterality: N/A;  . REPAIR RIGHT FEMORAL PSEUDOANEUYSM/  RIGHT FEM-POP BYPASS GRAFT/  DEBRIDEMENT RIGHT LOWER EXTREMITIY  VENOUS STATUS ULCERS X2  01-12-2005  . TOTAL ABDOMINAL HYSTERECTOMY W/ BILATERAL SALPINGOOPHORECTOMY  1986  . TRANSTHORACIC ECHOCARDIOGRAM  03/25/2010   EF 85-88%, LV systolic function low normal with mild inferoseptal hypocontractility; LA mildly dilated; mod MR; mild TR, RV systolic pressure elevated, mild pulm HTN; AV mildly sclerotic; mild pulm valve regurg; aortic root sclerosis/calcif       Allergies Patient has no known allergies.  Family History  Problem Relation Age of Onset  . Congestive Heart Failure Mother   . Diabetes Mother   . Stroke Father   . Cancer Maternal Aunt     Breast cancer    Social History Social History  Substance Use Topics  . Smoking status:  Former Smoker    Packs/day: 2.00    Years: 30.00    Quit date: 06/07/2009  . Smokeless tobacco: Never Used  . Alcohol use 1.8 oz/week    3 Standard drinks or equivalent per week     Comment: ocassional    Review of Systems  Constitutional: No fever/chills. Positive weakness and fall.  Eyes: No visual changes. ENT: No sore throat. Cardiovascular: Denies chest pain. Respiratory: Denies shortness of breath. Gastrointestinal: No abdominal pain.  No nausea, no vomiting.  No diarrhea.  No constipation. Genitourinary: Negative for dysuria. Musculoskeletal: Negative for back pain. Positive bilateral knee and ankle pain.  Skin: Negative for rash. Neurological: Negative for headaches, focal weakness or numbness.  10-point ROS otherwise negative.  ____________________________________________   PHYSICAL EXAM:  VITAL SIGNS: ED Triage Vitals  Enc Vitals Group     BP 03/23/16 1634 (!) 152/44     Pulse Rate 03/23/16 1634 70     Resp 03/23/16 1634 18     Temp 03/23/16 1634 97.7 F (36.5 C)     Temp Source 03/23/16 1634 Oral     SpO2 03/23/16 1634 95 %     Pain Score 03/23/16 1705 5   Constitutional: Alert and oriented. Well appearing and in no acute distress. Eyes: Conjunctivae are normal. PERRL. EOMI. Head: Atraumatic. Nose: No congestion/rhinnorhea. Mouth/Throat: Mucous membranes are moist.  Neck: No stridor. No cervical spine tenderness to palpation. Cardiovascular: Normal rate, regular rhythm. Good peripheral circulation. Grossly normal heart sounds.   Respiratory: Normal respiratory effort.  No retractions. Lungs CTAB. Gastrointestinal: Soft and nontender. No distention.  Musculoskeletal: No lower extremity tenderness nor edema. No gross deformities of extremities. Full ROM of bilateral hips, knees, and ankles.  Neurologic:  Normal speech and language. No gross focal neurologic deficits are appreciated.  Skin:  Skin is warm, dry and intact. No rash noted. Mild bruising to the  bilateral knees.  Psychiatric: Mood and affect are normal. Speech and behavior are normal.  ____________________________________________   LABS (all labs ordered are listed, but only abnormal results are displayed)  Labs Reviewed  BASIC METABOLIC PANEL - Abnormal; Notable for the following:       Result Value   CO2 21 (*)    BUN 53 (*)    Creatinine, Ser 2.03 (*)    GFR calc non Af Amer 23 (*)    GFR calc Af Amer 27 (*)    All other components within normal limits  CBC - Abnormal; Notable for the following:    RBC 3.71 (*)    Hemoglobin 10.5 (*)    HCT 33.0 (*)    All other components within normal limits  URINALYSIS, ROUTINE W REFLEX MICROSCOPIC - Abnormal; Notable for the following:    APPearance HAZY (*)  Hgb urine dipstick MODERATE (*)    Protein, ur 100 (*)    Leukocytes, UA LARGE (*)    Bacteria, UA RARE (*)    Squamous Epithelial / LPF 0-5 (*)    All other components within normal limits  BASIC METABOLIC PANEL - Abnormal; Notable for the following:    Chloride 112 (*)    CO2 20 (*)    Glucose, Bld 120 (*)    BUN 47 (*)    Creatinine, Ser 1.64 (*)    Calcium 8.2 (*)    GFR calc non Af Amer 30 (*)    GFR calc Af Amer 34 (*)    All other components within normal limits  CBC - Abnormal; Notable for the following:    RBC 3.32 (*)    Hemoglobin 9.5 (*)    HCT 29.7 (*)    All other components within normal limits  TSH - Abnormal; Notable for the following:    TSH 0.235 (*)    All other components within normal limits  GLUCOSE, CAPILLARY - Abnormal; Notable for the following:    Glucose-Capillary 111 (*)    All other components within normal limits  CBG MONITORING, ED - Abnormal; Notable for the following:    Glucose-Capillary 156 (*)    All other components within normal limits  URINE CULTURE  I-STAT TROPOININ, ED   ____________________________________________  EKG   EKG Interpretation  Date/Time:  Tuesday March 23 2016 19:25:32 EST Ventricular  Rate:  66 PR Interval:    QRS Duration: 85 QT Interval:  402 QTC Calculation: 422 R Axis:   84 Text Interpretation:  Sinus rhythm Ventricular premature complex Borderline right axis deviation Probable LVH with secondary repol abnrm ST depr, consider ischemia, inferior leads Minimal ST elevation, lateral leads No STEMI.  Confirmed by LONG MD, JOSHUA [phone]) on 03/23/2016 7:31:56 PM Also confirmed by LONG MD, JOSHUA [phone]), editor Lorenda Cahill CT, Leda Gauze [phone])  on 03/24/2016 8:14:10 AM       ____________________________________________  RADIOLOGY  Dg Knee 2 Views Left  Result Date: 03/23/2016 CLINICAL DATA:  [place] to knees, bilateral knee pain EXAM: LEFT KNEE - 1-2 VIEW COMPARISON:  None. FINDINGS: Surgical clips in the soft tissues. Extensive vascular calcification. No fracture or dislocation. Mild medial and lateral joint space compartment degenerative changes. IMPRESSION: No acute osseous abnormality.  Dense vascular calcifications Electronically Signed   By: Donavan Foil M.D.   On: 03/23/2016 20:10   Dg Knee 2 Views Right  Result Date: 03/23/2016 CLINICAL DATA:  Fall with pain in the knees EXAM: RIGHT KNEE - 1-2 VIEW COMPARISON:  None. FINDINGS: Multiple soft tissue surgical clips. Dense vascular calcifications. No acute displaced fracture or dislocation. Mild narrowing of the medial and lateral joint space compartments. Mild joint space calcifications are noted. IMPRESSION: 1. No acute osseous abnormality 2. Mild degenerative changes.  Chondrocalcinosis. Electronically Signed   By: Donavan Foil M.D.   On: 03/23/2016 20:11   Dg Ankle Complete Right  Result Date: 03/24/2016 CLINICAL DATA:  Patient states fall at home last pm, c/o pain entire right ankle EXAM: RIGHT ANKLE - COMPLETE 3+ VIEW COMPARISON:  None. FINDINGS: Ankle mortise intact. The talar dome is normal. No malleolar fracture. The calcaneus is normal. IMPRESSION: No fracture or dislocation. Electronically Signed   By: Suzy Bouchard M.D.   On: 03/24/2016 08:52   Dg Abdomen 1 View  Result Date: 03/23/2016 CLINICAL DATA:  Stent placement EXAM: ABDOMEN - 1 VIEW COMPARISON:  02/13/2016, 03/27/2015,  10/18/2015 FINDINGS: Right-sided ureteral stent with proximal aspect overlying expected location of right kidney and distal portion overlying expected location of the bladder. Coarse calcification in the left upper quadrant, could reflect pleural calcification. Multiple surgical clips in the upper abdomen. Nonobstructed gas pattern. Extensive vascular calcification. No discrete calcifications are identified. IMPRESSION: Right-sided ureteral stent as above.  Nonobstructed gas pattern Electronically Signed   By: Donavan Foil M.D.   On: 03/23/2016 23:21    ____________________________________________   PROCEDURES  Procedure(s) performed:   Procedures  None ____________________________________________   INITIAL IMPRESSION / ASSESSMENT AND PLAN / ED COURSE  Pertinent labs & imaging results that were available during my care of the patient were reviewed by me and considered in my medical decision making (see chart for details).  Patient presents to the ED for evaluation of LE weakness after a fall. Plain films of the LEs are negative. Labs consistent with UTI and mild AKI. She has associate CHF (EF 20%) so will start on gentle fluid rate along with abx.   Discussed patient's case with , Dr. Tamala Julian.  Recommend admission to med-surg, obs bed.  I will place holding orders per their request. Patient and family (if present) updated with plan. Care transferred to hospitlaist service.  I reviewed all nursing notes, vitals, pertinent old records, EKGs, labs, imaging (as available).  ____________________________________________  FINAL CLINICAL IMPRESSION(S) / ED DIAGNOSES  Final diagnoses:  Urinary tract infection with hematuria, site unspecified  Fall, initial encounter     MEDICATIONS GIVEN DURING THIS  VISIT:  Medications  HYDROcodone-acetaminophen (NORCO/VICODIN) 5-325 MG per tablet 1 tablet (1 tablet Oral Given 03/24/16 1020)  metoprolol succinate (TOPROL-XL) 24 hr tablet 25 mg (25 mg Oral Given 03/24/16 1019)  isosorbide mononitrate (IMDUR) 24 hr tablet 30 mg (30 mg Oral Given 03/24/16 1019)  rosuvastatin (CRESTOR) tablet 20 mg (20 mg Oral Given 03/24/16 1019)  polyethylene glycol (MIRALAX / GLYCOLAX) packet 17 g (not administered)  pantoprazole (PROTONIX) EC tablet 40 mg (40 mg Oral Given 03/24/16 1019)  latanoprost (XALATAN) 0.005 % ophthalmic solution 1 drop (1 drop Both Eyes Given 03/24/16 0120)  aspirin chewable tablet 81 mg (81 mg Oral Given 03/24/16 1019)  ferrous sulfate tablet 325 mg (not administered)  calcium-vitamin D (OSCAL WITH D) 500-200 MG-UNIT per tablet 1 tablet (1 tablet Oral Given 03/24/16 1019)  vitamin C (ASCORBIC ACID) tablet 500 mg (500 mg Oral Given 03/24/16 0119)  heparin injection 5,000 Units (not administered)  ondansetron (ZOFRAN) tablet 4 mg (not administered)    Or  ondansetron (ZOFRAN) injection 4 mg (not administered)  albuterol (PROVENTIL) (2.5 MG/3ML) 0.083% nebulizer solution 2.5 mg (not administered)  insulin aspart (novoLOG) injection 0-9 Units (0 Units Subcutaneous Not Given 03/24/16 0752)  insulin aspart (novoLOG) injection 0-5 Units (0 Units Subcutaneous Hold 03/24/16 0015)  cefTRIAXone (ROCEPHIN) 1 g in dextrose 5 % 50 mL IVPB (not administered)  0.9 %  sodium chloride infusion (not administered)  cefTRIAXone (ROCEPHIN) 1 g in dextrose 5 % 50 mL IVPB (0 g Intravenous Stopped 03/24/16 0113)  HYDROcodone-acetaminophen (NORCO/VICODIN) 5-325 MG per tablet 1 tablet (1 tablet Oral Given 03/23/16 2332)  0.9 %  sodium chloride infusion ( Intravenous Transfusing/Transfer 03/24/16 0400)     NEW OUTPATIENT MEDICATIONS STARTED DURING THIS VISIT:  None   Note:  This document was prepared using Dragon voice recognition software and may include unintentional  dictation errors.  Nanda Quinton, MD Emergency Medicine   Margette Fast, MD 03/24/16 [phone]

## 2016-03-23 NOTE — Telephone Encounter (Signed)
Pt called and wanted to verify that Dr. Dwyane Dee does want her to go back to taking a whole Crestor tablet.  She said you may leave her a message if she is not home.

## 2016-03-23 NOTE — ED Notes (Signed)
RN contacted Pharmacy to request orders be verified.

## 2016-03-23 NOTE — ED Triage Notes (Addendum)
Patient states she was heading to a bench at the store because her legs were feeling weak and "legs gave out" before getting there." Patient states I get like this before I finish shopping   Patient denies "fainting". Patient states she went to PCP yesterday and BP dropped when standing.

## 2016-03-23 NOTE — H&P (Signed)
History and Physical    MAGHEN GROUP EXB:[id number] DOB: 07-24-1940 DOA: 03/23/2016  Referring MD/NP/PA: Dr. Laverta Baltimore PCP: [name], MD  Patient coming from: [place] store brought by her daughter  Chief Complaint: Fall  HPI: [name] [name] is a 76 y.o. female with medical history significant of HTN, HLD, atrial flutter, CHF last EF 20-25%in 01/2016, b/l hydronephrosis s/p right ureteral stent in 02/2016; who presents after having a fall in the grocery store today around 3 PM. Patient states that she was walking and she felt her legs getting weak. She tried to get to a bench, but did not make it before falling onto her knees. States it felt as though her legs went to sleep. Denies any trauma to her head, palpitations, or loss of consciousness. Reports similar symptoms happening approximately 6 months ago when she again states it felt as though her legs fell asleep. Patient's daughter brought her here to hospital for further evaluation. At this time patient continues to complain of knee pain. Associated symptoms include vaginal itching and back pain which she notes is chronic. Denies chest pain, shortness of breath, dysuria, frequency of urination, change in appetite, or leg swelling. The only recent medication change patient notes was that she was increased from 10 mg of Crestor to 20 mg.   ED Course: Upon admission to the emergency department patient patient was noted have vitals within normal limits. Lab work revealed WBC 9, hemoglobin of 10.5, BUN 53, creatinine 2.03( baseline previously noted to be 1.3-1.6). UA was positive for signs of infection. Imaging studies of the bilateral knees showed no acute fractures Patient was started on Rocephin in the ED.  Review of Systems: As per HPI otherwise 10 point review of systems negative.   Past Medical History:  Diagnosis Date  . AAA (abdominal aortic aneurysm) ([place])   . Bilateral hydronephrosis   . Carotid artery stenosis    carotid doppler  06/2012 - Right CCA/Bulb/ICA with chronic occlusion; L vertebral artery with abnormal blood flow; L Bulb/Prox ICA  s/p endarterectomy with mild fibrous plaque, 50% diameter reduction  . CHF (congestive heart failure) ([place])   . Choledocholithiasis 2017  . CKD (chronic kidney disease), stage III   . Coronary artery disease due to lipid rich plaque cardiologist-  dr berry   s/p CABG x6 1997-- cath 12-09-2009 occluded vein to obtuse marginal branch and ramus branch with patent vien to PDA and patent LIMA to LAD, ef 40%-- Myoview 11-24-2011, nonischemic  . Diverticulosis   . Dyspnea on exertion   . GERD (gastroesophageal reflux disease)   . GI bleed   . History of sepsis    10-18-2014 w/ acute pyelonephritis  . Hyperlipidemia   . Hypertension   . Hypothyroidism   . Ischemic cardiomyopathy    03-25-2010-- per lasts echo EF  50-55%  . PAD (peripheral artery disease) ([place])    09/2010 LEAs - R ABI of 0.45, occluded fem-pop bypass graft, L ABI of 0.59 with occluded SFA; severe arterial insuff  . PVD (peripheral vascular disease) with claudication ([place])    last duplex 07-04-2015 -- Right CCA and ICA chronic occlusion, 10-27% LICA, Patent vertebral arteries w/ antegrade flow, bilateral normal subclavian arteries   . Retroperitoneal fibrosis   . Tubular adenoma of colon   . Type 2 diabetes mellitus ([place])    monitored by dr Dwyane Dee    Past Surgical History:  Procedure Laterality Date  . AORTA - BILATERAL FEMORAL ARTERY BYPASS GRAFT  1997   and  RIGHT FEM-POP   . CARDIAC CATHETERIZATION  12-09-2009  dr al little   EF >40%-- occluded vein to OM & ramus branches, patent vein to PDA, patent LIMA to LAD (Dr. [name], [facility]) - later had thrombectomy of R fem-pop bypass ad R common femoral & profunda femoris artery (Dr. Oneida Alar)  . CARDIOVASCULAR STRESS TEST  11-24-2011   dr berry   Low Risk study: fixed basal to mid inferior attenuation artifact, no reversible ischemia,  normal LV function and wall motion , ef  67%  . CAROTID ENDARTERECTOMY Bilateral right 1994//  left ?  Marland Kitchen CATARACT EXTRACTION W/ INTRAOCULAR LENS  IMPLANT, BILATERAL  2006  . CHOLECYSTECTOMY N/A 09/05/2015   Procedure: ATTEMPTED LAPAROSCOPIC CHOLECYSTECTOMY, EXPLORATORY LAPAROTOMY WITH CHOLECYSTECTOMY;  Surgeon: Autumn Messing III, MD;  Location: [facility];  Service: General;  Laterality: N/A;  . COLONOSCOPY    . COLONOSCOPY  10/21/2015  . COLONOSCOPY WITH PROPOFOL N/A 10/21/2015   Procedure: COLONOSCOPY WITH PROPOFOL;  Surgeon: Milus Banister, MD;  Location: [facility];  Service: Endoscopy;  Laterality: N/A;  . CORONARY ARTERY BYPASS GRAFT  1997   x6; internal mammary to LAD, SVG to ramus #1 & #2, SVG to OM, SVG to PDA,   . CYSTOSCOPY W/ RETROGRADES Right 08/01/2015   Procedure: CYSTOSCOPY WITH RETROGRADE PYELOGRAM;  Surgeon: Ardis Hughs, MD;  Location[facility];  Service: Urology;  Laterality: Right;  . CYSTOSCOPY W/ URETERAL STENT PLACEMENT  03/10/2012   Procedure: CYSTOSCOPY WITH RETROGRADE PYELOGRAM/URETERAL STENT PLACEMENT;  Surgeon: Hanley Ben, MD;  Location: [facility];  Service: Urology;  Laterality: Left;  . CYSTOSCOPY W/ URETERAL STENT PLACEMENT Bilateral 03/07/2015   Procedure: BILATERAL RETROGRADE PYELOGRAM AND RIGHT URETERAL STENT PLACEMENT;  Surgeon: Ardis Hughs, MD;  [facility];  Service: Urology;  Laterality: Bilateral;  . CYSTOSCOPY W/ URETERAL STENT PLACEMENT Right 08/01/2015   Procedure: CYSTOSCOPY WITH STENT REPLACEMENT;  Surgeon: Ardis Hughs, MD;  Location: [facility];  Service: Urology;  Laterality: Right;  . CYSTOSCOPY W/ URETERAL STENT PLACEMENT Right 02/13/2016   Procedure: RIGHT URETERAL STENT EXCHANGE;  Surgeon: Ardis Hughs, MD;  Location: [facility];  Service: Urology;  Laterality: Right;  . ERCP N/A 09/03/2015   Procedure: ENDOSCOPIC RETROGRADE CHOLANGIOPANCREATOGRAPHY (ERCP);  Surgeon: Irene Shipper, MD;  Location: [facility];   Service: Endoscopy;  Laterality: N/A;  . ESOPHAGOGASTRODUODENOSCOPY    . ESOPHAGOGASTRODUODENOSCOPY N/A 10/19/2015   Procedure: ESOPHAGOGASTRODUODENOSCOPY (EGD);  Surgeon: Gatha Mayer, MD;  Location: [facility];  Service: Endoscopy;  Laterality: N/A;  . GIVENS CAPSULE STUDY  10/21/2015  . GIVENS CAPSULE STUDY N/A 10/21/2015   Procedure: GIVENS CAPSULE STUDY;  Surgeon: Milus Banister, MD;  Location: [facility];  Service: Endoscopy;  Laterality: N/A;  . REPAIR RIGHT FEMORAL PSEUDOANEUYSM/  RIGHT FEM-POP BYPASS GRAFT/  DEBRIDEMENT RIGHT LOWER EXTREMITIY VENOUS STATUS ULCERS X2  01-12-2005  . TOTAL ABDOMINAL HYSTERECTOMY W/ BILATERAL SALPINGOOPHORECTOMY  1986  . TRANSTHORACIC ECHOCARDIOGRAM  03/25/2010   EF 16-10%, LV systolic function low normal with mild inferoseptal hypocontractility; LA mildly dilated; mod MR; mild TR, RV systolic pressure elevated, mild pulm HTN; AV mildly sclerotic; mild pulm valve regurg; aortic root sclerosis/calcif      reports that she quit smoking about 6 years ago. She has a 60.00 pack-year smoking history. She has never used smokeless tobacco. She reports that she drinks about 1.8 oz of alcohol per week . She reports that she does not use drugs.  No  Known Allergies  Family History  Problem Relation Age of Onset  . Congestive Heart Failure Mother   . Diabetes Mother   . Stroke Father   . Cancer Maternal Aunt     Breast cancer    Prior to Admission medications   Medication Sig Start Date End Date Taking? Authorizing Provider  aspirin 81 MG chewable tablet Chew 81 mg by mouth daily.    Yes Historical Provider, MD  calcium-vitamin D (OSCAL WITH D) 500-200 MG-UNIT per tablet Take 1 tablet by mouth every morning.   Yes Historical Provider, MD  Cranberry 250 MG TABS Take 250 mg by mouth daily.    Yes Historical Provider, MD  Cyanocobalamin (VITAMIN B 12 PO) Take 1 tablet by mouth daily.    Yes Historical Provider, MD  ferrous sulfate 325 (65 FE) MG tablet Take  325 mg by mouth every evening.    Yes Historical Provider, MD  furosemide (LASIX) 20 MG tablet Take 20 mg by mouth every other day.   Yes Historical Provider, MD  hydrALAZINE (APRESOLINE) 10 MG tablet Take 1 tablet (10 mg total) by mouth every 8 (eight) hours. Patient taking differently: Take 10 mg by mouth 2 (two) times daily.  01/26/16  Yes Patrecia Pour, MD  insulin aspart (NOVOLOG) 100 UNIT/ML FlexPen Inject 8 units three times a day with meals Patient taking differently: Inject 8 Units into the skin 3 (three) times daily with meals.  09/30/14  Yes [name], MD  isosorbide mononitrate (IMDUR) 30 MG 24 hr tablet Take 1 tablet (30 mg total) by mouth daily. 01/27/16  Yes Patrecia Pour, MD  latanoprost (XALATAN) 0.005 % ophthalmic solution Place 1 drop into both eyes at bedtime. 03/11/14  Yes Historical Provider, MD  LEVEMIR FLEXTOUCH 100 UNIT/ML Pen INJECT 22 UNITS AT BEDTIME Patient taking differently: INJECT 18 UNITS AT BEDTIME 02/18/16  Yes [name], MD  levothyroxine (SYNTHROID, LEVOTHROID) 125 MCG tablet Take 125 mcg by mouth daily before breakfast.   Yes Historical Provider, MD  metoprolol succinate (TOPROL-XL) 25 MG 24 hr tablet Take 1 tablet (25 mg total) by mouth daily. 01/26/16  Yes Patrecia Pour, MD  omeprazole (PRILOSEC) 20 MG capsule TAKE 1 CAPSULE DAILY Patient taking differently: TAKE 1 CAPSULE PO DAILY 02/12/15  Yes [name], MD  rosuvastatin (CRESTOR) 20 MG tablet Take 0.5 tablets (10 mg total) by mouth daily. Patient taking differently: Take 20 mg by mouth daily.  01/26/16  Yes Patrecia Pour, MD  VENTOLIN HFA 108 [phone] Base) MCG/ACT inhaler inhale 2 puffs by mouth INTO THE LUNGS every 6 hours if needed for wheezing 12/08/15  Yes [name], MD  vitamin C (ASCORBIC ACID) 500 MG tablet Take 500 mg by mouth at bedtime.    Yes Historical Provider, MD  Vitamin D, Ergocalciferol, (DRISDOL) 50000 units CAPS capsule TAKE 1 CAPSULE BY MOUTH EVERY 14 DAYS 03/11/16  Yes [name], MD  aspirin  325 MG tablet Take 650 mg by mouth as needed for moderate pain or headache.    Historical Provider, MD  HYDROcodone-acetaminophen (NORCO/VICODIN) 5-325 MG tablet Take 1 tablet every 12 hours as needed for back pain 03/22/16   [name], MD  Insulin Pen Needle (PEN NEEDLES) 31G X 6 MM MISC Use to inject insulin 4 times per day. 10/06/15   [name], MD  methocarbamol (ROBAXIN) 500 MG tablet Take 1 tablet (500 mg total) by mouth 3 (three) times daily. As needed Patient not taking: Reported on 03/23/2016  01/13/16   [name], MD  polyethylene glycol (MIRALAX / GLYCOLAX) packet Take 17 g by mouth daily as needed for moderate constipation. 09/08/15   Cristal Ford, DO    Physical Exam:    Constitutional: Elderly female in NAD, calm, comfortable Vitals:   03/23/16 1634 03/23/16 1934 03/23/16 1936 03/23/16 2337  BP: (!) 152/44  163/76 97/73  Pulse: 70  68 80  Resp: 18  16 18   Temp: 97.7 F (36.5 C)     TempSrc: Oral     SpO2: 95%  100% 97%  Weight:  63 kg (139 lb)    Height:  5\' 6"  (1.676 m)     Eyes: PERRL, lids and conjunctivae normal ENMT: Mucous membranes are moist. Posterior pharynx clear of any exudate or lesions.Normal dentition.  Neck: normal, supple, no masses, no thyromegaly Respiratory: clear to auscultation bilaterally, no wheezing, no crackles. Normal respiratory effort. No accessory muscle use.  Cardiovascular: Regular rate and rhythm, no murmurs / rubs / gallops. No extremity edema. 2+ pedal pulses. No carotid bruits.  Abdomen: no tenderness, no masses palpated. No hepatosplenomegaly. Bowel sounds positive.  Musculoskeletal: no clubbing / cyanosis. No joint deformity upper and lower extremities. Good ROM, no contractures. Normal muscle tone.  Skin: Abrasions to the bilateral knees. No induration Neurologic: CN 2-12 grossly intact. Sensation intact, DTR normal. Strength 5/5 in all 4.  Psychiatric: Normal judgment and insight. Alert and oriented x 3. Normal mood.     Labs  on Admission: I have personally reviewed following labs and imaging studies  CBC:  Recent Labs Lab 03/23/16 1942  WBC 9.0  HGB 10.5*  HCT 33.0*  MCV 88.9  PLT 161   Basic Metabolic Panel:  Recent Labs Lab 03/19/16 1059 03/23/16 1942  NA 139 141  K 4.4 4.5  CL 108 106  CO2 25 21*  GLUCOSE 107* 96  BUN 42* 53*  CREATININE 1.68* 2.03*  CALCIUM 8.9 8.9   GFR: Estimated Creatinine Clearance: 22.8 mL/min (by C-G formula based on SCr of 2.03 mg/dL (H)). Liver Function Tests: No results for input(s): AST, ALT, ALKPHOS, BILITOT, PROT, ALBUMIN in the last 168 hours. No results for input(s): LIPASE, AMYLASE in the last 168 hours. No results for input(s): AMMONIA in the last 168 hours. Coagulation Profile: No results for input(s): INR, PROTIME in the last 168 hours. Cardiac Enzymes: No results for input(s): CKTOTAL, CKMB, CKMBINDEX, TROPONINI in the last 168 hours. BNP (last 3 results)  Recent Labs  02/09/16 1334  PROBNP 119.0*   HbA1C: No results for input(s): HGBA1C in the last 72 hours. CBG: No results for input(s): GLUCAP in the last 168 hours. Lipid Profile: No results for input(s): CHOL, HDL, LDLCALC, TRIG, CHOLHDL, LDLDIRECT in the last 72 hours. Thyroid Function Tests: No results for input(s): TSH, T4TOTAL, FREET4, T3FREE, THYROIDAB in the last 72 hours. Anemia Panel: No results for input(s): VITAMINB12, FOLATE, FERRITIN, TIBC, IRON, RETICCTPCT in the last 72 hours. Urine analysis:    Component Value Date/Time   COLORURINE YELLOW 03/23/2016 1717   APPEARANCEUR HAZY (A) 03/23/2016 1717   LABSPEC 1.013 03/23/2016 1717   PHURINE 5.0 03/23/2016 1717   GLUCOSEU NEGATIVE 03/23/2016 1717   GLUCOSEU NEGATIVE 02/02/2016 0926   HGBUR MODERATE (A) 03/23/2016 1717   BILIRUBINUR NEGATIVE 03/23/2016 1717   KETONESUR NEGATIVE 03/23/2016 1717   PROTEINUR 100 (A) 03/23/2016 1717   UROBILINOGEN 0.2 02/02/2016 0926   NITRITE NEGATIVE 03/23/2016 1717   LEUKOCYTESUR LARGE  (A) 03/23/2016 1717  Sepsis Labs: No results found for this or any previous visit (from the past 240 hour(s)).   Radiological Exams on Admission: Dg Knee 2 Views Left  Result Date: 03/23/2016 CLINICAL DATA:  [place] to knees, bilateral knee pain EXAM: LEFT KNEE - 1-2 VIEW COMPARISON:  None. FINDINGS: Surgical clips in the soft tissues. Extensive vascular calcification. No fracture or dislocation. Mild medial and lateral joint space compartment degenerative changes. IMPRESSION: No acute osseous abnormality.  Dense vascular calcifications Electronically Signed   By: Donavan Foil M.D.   On: 03/23/2016 20:10   Dg Knee 2 Views Right  Result Date: 03/23/2016 CLINICAL DATA:  Fall with pain in the knees EXAM: RIGHT KNEE - 1-2 VIEW COMPARISON:  None. FINDINGS: Multiple soft tissue surgical clips. Dense vascular calcifications. No acute displaced fracture or dislocation. Mild narrowing of the medial and lateral joint space compartments. Mild joint space calcifications are noted. IMPRESSION: 1. No acute osseous abnormality 2. Mild degenerative changes.  Chondrocalcinosis. Electronically Signed   By: Donavan Foil M.D.   On: 03/23/2016 20:11   Dg Abdomen 1 View  Result Date: 03/23/2016 CLINICAL DATA:  Stent placement EXAM: ABDOMEN - 1 VIEW COMPARISON:  02/13/2016, 03/27/2015, 10/18/2015 FINDINGS: Right-sided ureteral stent with proximal aspect overlying expected location of right kidney and distal portion overlying expected location of the bladder. Coarse calcification in the left upper quadrant, could reflect pleural calcification. Multiple surgical clips in the upper abdomen. Nonobstructed gas pattern. Extensive vascular calcification. No discrete calcifications are identified. IMPRESSION: Right-sided ureteral stent as above.  Nonobstructed gas pattern Electronically Signed   By: Donavan Foil M.D.   On: 03/23/2016 23:21    EKG: Independently reviewed. Sinus rhythm with PVC  Assessment/Plan Fall with  bilateral knee pain: Acute. Imaging studies showed no acute fractures. - Admit to a MedSurg bed - Hydrocodone prn pain  - Physical therapy to eval and treat  - Social work consult  UTI (urinary tract infection) - Follow-up urine culture  - Continue Rocephin  Acute kidney injury on chronic kidney disease stage III: Patient presents with elevated creatinine 2.03 and a BUN of 53. Baseline creatinine previously 1.3-1.6. Question aspect of dehydration.  - Giving gentle IV fluids - Follow-up repeat BMP  Congestive heart failure: Patient's last echocardiogram showed EF of 20-25%. Patient was scheduled to have a repeat echocardiogram on the 26th of this month by Dr. Gwenlyn Found. - Continue isosorbide mononitrate, hydralazine, metoprolol, and  aspirin - Monitor ins and outs    Diabetes mellitus type 2: Patient last hemoglobin A1c was 6. - Hypoglycemic protocol - CBGs every before meals and at bedtime with sensitive sliding scale insulin   Essential hypertension - Continue home regimen as tolerated  Hypothyroidism - Check TSH - Continue levothyroxine    GERD - Pharmacy substitution of Protonix   DVT prophylaxis: heparin   Code Status: full Family Communication: No family bedside Disposition Plan: Discharge home once medically stable Consults called: None Admission status: Observation  Norval Morton MD [facility] Pager [phone]  If 7PM-7AM, please contact night-coverage [email or web address] Password [facility]  03/23/2016, 11:53 PM

## 2016-03-24 ENCOUNTER — Observation Stay (HOSPITAL_COMMUNITY): Payer: Medicare Other

## 2016-03-24 DIAGNOSIS — N179 Acute kidney failure, unspecified: Secondary | ICD-10-CM

## 2016-03-24 DIAGNOSIS — K219 Gastro-esophageal reflux disease without esophagitis: Secondary | ICD-10-CM | POA: Diagnosis present

## 2016-03-24 DIAGNOSIS — N183 Chronic kidney disease, stage 3 (moderate): Secondary | ICD-10-CM | POA: Diagnosis present

## 2016-03-24 DIAGNOSIS — M25562 Pain in left knee: Secondary | ICD-10-CM | POA: Diagnosis present

## 2016-03-24 DIAGNOSIS — R319 Hematuria, unspecified: Secondary | ICD-10-CM | POA: Diagnosis present

## 2016-03-24 DIAGNOSIS — I13 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 1 through stage 4 chronic kidney disease, or unspecified chronic kidney disease: Secondary | ICD-10-CM | POA: Diagnosis present

## 2016-03-24 DIAGNOSIS — Z961 Presence of intraocular lens: Secondary | ICD-10-CM | POA: Diagnosis present

## 2016-03-24 DIAGNOSIS — N3000 Acute cystitis without hematuria: Secondary | ICD-10-CM | POA: Diagnosis not present

## 2016-03-24 DIAGNOSIS — N390 Urinary tract infection, site not specified: Secondary | ICD-10-CM | POA: Diagnosis present

## 2016-03-24 DIAGNOSIS — Z95828 Presence of other vascular implants and grafts: Secondary | ICD-10-CM | POA: Diagnosis not present

## 2016-03-24 DIAGNOSIS — N189 Chronic kidney disease, unspecified: Secondary | ICD-10-CM

## 2016-03-24 DIAGNOSIS — E1122 Type 2 diabetes mellitus with diabetic chronic kidney disease: Secondary | ICD-10-CM | POA: Diagnosis present

## 2016-03-24 DIAGNOSIS — Z7982 Long term (current) use of aspirin: Secondary | ICD-10-CM | POA: Diagnosis not present

## 2016-03-24 DIAGNOSIS — Z951 Presence of aortocoronary bypass graft: Secondary | ICD-10-CM | POA: Diagnosis not present

## 2016-03-24 DIAGNOSIS — I5042 Chronic combined systolic (congestive) and diastolic (congestive) heart failure: Secondary | ICD-10-CM | POA: Diagnosis not present

## 2016-03-24 DIAGNOSIS — E1151 Type 2 diabetes mellitus with diabetic peripheral angiopathy without gangrene: Secondary | ICD-10-CM | POA: Diagnosis present

## 2016-03-24 DIAGNOSIS — Z87891 Personal history of nicotine dependence: Secondary | ICD-10-CM | POA: Diagnosis not present

## 2016-03-24 DIAGNOSIS — Z9842 Cataract extraction status, left eye: Secondary | ICD-10-CM | POA: Diagnosis not present

## 2016-03-24 DIAGNOSIS — S99911A Unspecified injury of right ankle, initial encounter: Secondary | ICD-10-CM | POA: Diagnosis not present

## 2016-03-24 DIAGNOSIS — M25571 Pain in right ankle and joints of right foot: Secondary | ICD-10-CM | POA: Diagnosis not present

## 2016-03-24 DIAGNOSIS — E860 Dehydration: Secondary | ICD-10-CM | POA: Diagnosis present

## 2016-03-24 DIAGNOSIS — E118 Type 2 diabetes mellitus with unspecified complications: Secondary | ICD-10-CM | POA: Diagnosis not present

## 2016-03-24 DIAGNOSIS — Z9841 Cataract extraction status, right eye: Secondary | ICD-10-CM | POA: Diagnosis not present

## 2016-03-24 DIAGNOSIS — M25561 Pain in right knee: Secondary | ICD-10-CM | POA: Diagnosis present

## 2016-03-24 DIAGNOSIS — Y92512 Supermarket, store or market as the place of occurrence of the external cause: Secondary | ICD-10-CM | POA: Diagnosis not present

## 2016-03-24 DIAGNOSIS — W19XXXA Unspecified fall, initial encounter: Secondary | ICD-10-CM | POA: Diagnosis not present

## 2016-03-24 DIAGNOSIS — Z79899 Other long term (current) drug therapy: Secondary | ICD-10-CM | POA: Diagnosis not present

## 2016-03-24 DIAGNOSIS — W1830XA Fall on same level, unspecified, initial encounter: Secondary | ICD-10-CM | POA: Diagnosis present

## 2016-03-24 DIAGNOSIS — Z794 Long term (current) use of insulin: Secondary | ICD-10-CM | POA: Diagnosis not present

## 2016-03-24 DIAGNOSIS — E039 Hypothyroidism, unspecified: Secondary | ICD-10-CM | POA: Diagnosis present

## 2016-03-24 LAB — BASIC METABOLIC PANEL
Anion gap: 7 (ref 5–15)
BUN: 47 mg/dL — AB (ref 6–20)
CHLORIDE: 112 mmol/L — AB (ref 101–111)
CO2: 20 mmol/L — AB (ref 22–32)
CREATININE: 1.64 mg/dL — AB (ref 0.44–1.00)
Calcium: 8.2 mg/dL — ABNORMAL LOW (ref 8.9–10.3)
GFR calc non Af Amer: 30 mL/min — ABNORMAL LOW (ref >60)
GFR, EST AFRICAN AMERICAN: 34 mL/min — AB (ref >60)
Glucose, Bld: 120 mg/dL — ABNORMAL HIGH (ref 65–99)
Potassium: 4.1 mmol/L (ref 3.5–5.1)
Sodium: 139 mmol/L (ref 135–145)

## 2016-03-24 LAB — CBG MONITORING, ED: Glucose-Capillary: 156 mg/dL — ABNORMAL HIGH (ref 65–99)

## 2016-03-24 LAB — GLUCOSE, CAPILLARY
GLUCOSE-CAPILLARY: 137 mg/dL — AB (ref 65–99)
Glucose-Capillary: 111 mg/dL — ABNORMAL HIGH (ref 65–99)
Glucose-Capillary: 136 mg/dL — ABNORMAL HIGH (ref 65–99)

## 2016-03-24 LAB — CBC
HCT: 29.7 % — ABNORMAL LOW (ref 36.0–46.0)
Hemoglobin: 9.5 g/dL — ABNORMAL LOW (ref 12.0–15.0)
MCH: 28.6 pg (ref 26.0–34.0)
MCHC: 32 g/dL (ref 30.0–36.0)
MCV: 89.5 fL (ref 78.0–100.0)
PLATELETS: 184 10*3/uL (ref 150–400)
RBC: 3.32 MIL/uL — AB (ref 3.87–5.11)
RDW: 15.5 % (ref 11.5–15.5)
WBC: 7.8 10*3/uL (ref 4.0–10.5)

## 2016-03-24 LAB — TSH: TSH: 0.235 u[IU]/mL — AB (ref 0.350–4.500)

## 2016-03-24 MED ORDER — DEXTROSE 5 % IV SOLN
1.0000 g | INTRAVENOUS | Status: DC
  Administered 2016-03-24: 1 g via INTRAVENOUS
  Filled 2016-03-24 (×2): qty 10

## 2016-03-24 MED ORDER — HYDRALAZINE HCL 10 MG PO TABS
10.0000 mg | ORAL_TABLET | Freq: Two times a day (BID) | ORAL | Status: DC
  Filled 2016-03-24: qty 1

## 2016-03-24 MED ORDER — HYDROCODONE-ACETAMINOPHEN 5-325 MG PO TABS
1.0000 | ORAL_TABLET | ORAL | Status: DC | PRN
  Administered 2016-03-24: 2 via ORAL
  Administered 2016-03-25: 1 via ORAL
  Filled 2016-03-24: qty 1
  Filled 2016-03-24: qty 2

## 2016-03-24 MED ORDER — SODIUM CHLORIDE 0.9 % IV SOLN
INTRAVENOUS | Status: AC
  Administered 2016-03-24: 11:00:00 via INTRAVENOUS

## 2016-03-24 MED ORDER — PANTOPRAZOLE SODIUM 40 MG PO TBEC
40.0000 mg | DELAYED_RELEASE_TABLET | Freq: Every day | ORAL | Status: DC
  Administered 2016-03-24 – 2016-03-25 (×2): 40 mg via ORAL
  Filled 2016-03-24 (×2): qty 1

## 2016-03-24 MED ORDER — CALCIUM CARBONATE-VITAMIN D 500-200 MG-UNIT PO TABS
1.0000 | ORAL_TABLET | Freq: Every day | ORAL | Status: DC
  Administered 2016-03-24 – 2016-03-25 (×2): 1 via ORAL
  Filled 2016-03-24 (×2): qty 1

## 2016-03-24 MED ORDER — FERROUS SULFATE 325 (65 FE) MG PO TABS
325.0000 mg | ORAL_TABLET | Freq: Every evening | ORAL | Status: DC
  Administered 2016-03-24: 325 mg via ORAL
  Filled 2016-03-24: qty 1

## 2016-03-24 MED ORDER — ALBUTEROL SULFATE (2.5 MG/3ML) 0.083% IN NEBU: 2.5000 mg | INHALATION_SOLUTION | RESPIRATORY_TRACT | Status: DC | PRN

## 2016-03-24 MED ORDER — ONDANSETRON HCL 4 MG PO TABS: 4.0000 mg | ORAL_TABLET | Freq: Four times a day (QID) | ORAL | Status: DC | PRN

## 2016-03-24 MED ORDER — ISOSORBIDE MONONITRATE ER 30 MG PO TB24
30.0000 mg | ORAL_TABLET | Freq: Every day | ORAL | Status: DC
Start: 2016-03-24 — End: 2016-03-25
  Administered 2016-03-24 – 2016-03-25 (×2): 30 mg via ORAL
  Filled 2016-03-24 (×3): qty 1

## 2016-03-24 MED ORDER — HYDROCODONE-ACETAMINOPHEN 5-325 MG PO TABS
1.0000 | ORAL_TABLET | Freq: Four times a day (QID) | ORAL | Status: DC | PRN
  Administered 2016-03-24 (×2): 1 via ORAL
  Filled 2016-03-24 (×2): qty 1

## 2016-03-24 MED ORDER — ONDANSETRON HCL 4 MG/2ML IJ SOLN: 4.0000 mg | Freq: Four times a day (QID) | INTRAMUSCULAR | Status: DC | PRN

## 2016-03-24 MED ORDER — METOPROLOL SUCCINATE ER 25 MG PO TB24
25.0000 mg | ORAL_TABLET | Freq: Every day | ORAL | Status: DC
  Administered 2016-03-24 – 2016-03-25 (×2): 25 mg via ORAL
  Filled 2016-03-24 (×3): qty 1

## 2016-03-24 MED ORDER — VITAMIN C 500 MG PO TABS
500.0000 mg | ORAL_TABLET | Freq: Every day | ORAL | Status: DC
  Administered 2016-03-24 (×2): 500 mg via ORAL
  Filled 2016-03-24 (×2): qty 1

## 2016-03-24 MED ORDER — POLYETHYLENE GLYCOL 3350 17 G PO PACK: 17.0000 g | PACK | Freq: Every day | ORAL | Status: DC | PRN

## 2016-03-24 MED ORDER — HEPARIN SODIUM (PORCINE) 5000 UNIT/ML IJ SOLN
5000.0000 [IU] | Freq: Three times a day (TID) | INTRAMUSCULAR | Status: DC
  Administered 2016-03-24 – 2016-03-25 (×3): 5000 [IU] via SUBCUTANEOUS
  Filled 2016-03-24 (×5): qty 1

## 2016-03-24 MED ORDER — MORPHINE SULFATE (PF) 2 MG/ML IV SOLN: 2.0000 mg | INTRAVENOUS | Status: DC | PRN

## 2016-03-24 MED ORDER — INSULIN ASPART 100 UNIT/ML ~~LOC~~ SOLN
0.0000 [IU] | Freq: Three times a day (TID) | SUBCUTANEOUS | Status: DC
Start: 2016-03-24 — End: 2016-03-25
  Administered 2016-03-24 (×2): 1 [IU] via SUBCUTANEOUS

## 2016-03-24 MED ORDER — ASPIRIN 81 MG PO CHEW
81.0000 mg | CHEWABLE_TABLET | Freq: Every day | ORAL | Status: DC
Start: 2016-03-24 — End: 2016-03-25
  Administered 2016-03-24 – 2016-03-25 (×2): 81 mg via ORAL
  Filled 2016-03-24 (×2): qty 1

## 2016-03-24 MED ORDER — ROSUVASTATIN CALCIUM 20 MG PO TABS
20.0000 mg | ORAL_TABLET | Freq: Every day | ORAL | Status: DC
  Administered 2016-03-24 – 2016-03-25 (×2): 20 mg via ORAL
  Filled 2016-03-24 (×3): qty 1

## 2016-03-24 MED ORDER — LATANOPROST 0.005 % OP SOLN
1.0000 [drp] | Freq: Every day | OPHTHALMIC | Status: DC
  Administered 2016-03-24 (×2): 1 [drp] via OPHTHALMIC
  Filled 2016-03-24: qty 2.5

## 2016-03-24 MED ORDER — INSULIN ASPART 100 UNIT/ML ~~LOC~~ SOLN
0.0000 [IU] | Freq: Every day | SUBCUTANEOUS | Status: DC
  Administered 2016-03-24: 0 [IU] via SUBCUTANEOUS

## 2016-03-24 NOTE — Evaluation (Addendum)
Physical Therapy Evaluation Patient Details Name: [name] [name] MRN: [id number] DOB: 09-04-1940 Today's Date: 03/24/2016   History of Present Illness  [name] [name] is an 76 y.o. female with medical history significant of HTN, HLD, atrial flutter, CHF last EF 20-25%in 01/2016, b/l hydronephrosis s/p right ureteral stent in 02/2016; who presents after having a fall on 03/23/16. Found with possible UTI and Aacute  kidney failure.  Clinical Impression  The patient tolerated transfer to [facility] with min/mod assist. Encouraged patient to get  To [facility] each need.  Patient reports that she will stay with daughter. Pt admitted with above diagnosis. Pt currently with functional limitations due to the deficits listed below (see PT Problem List).  Pt will benefit from skilled PT to increase their independence and safety with mobility to allow discharge to the venue listed below.   The xray of right ankle is negative.   The Patient may benefit from a right ankle splint called "ASO" which is  Ordered from orthopedic technician-phone [phone]. thanks     Follow Up Recommendations Home health PT;Supervision/Assistance - 24 hour    Equipment Recommendations  None recommended by PT    Recommendations for Other Services       Precautions / Restrictions Precautions Precautions: Fall      Mobility  Bed Mobility Overal bed mobility: Modified Independent                Transfers Overall transfer level: Needs assistance Equipment used: Rolling walker (2 wheeled) Transfers: Sit to/from [facility] Sit to Stand: Mod assist Stand pivot transfers: Min assist;Mod assist       General transfer comment: mod assistance without the RW, stand and pivot, antalgic on the right foot pivot to [facility]. Stood with RW and pivoted to Bed from [facility] with min assistance  Ambulation/Gait      NT          Stairs            Wheelchair Mobility    Modified Rankin (Stroke Patients Only)        Balance                                             Pertinent Vitals/Pain Pain Assessment: 0-10 Pain Score: 7  Pain Location: right ankle, especially when  performs active ROM, inversion and eversion Pain Descriptors / Indicators: Discomfort;Grimacing;Guarding;Sharp Pain Intervention(s): Limited activity within patient's tolerance;Monitored during session    Home Living Family/patient expects to be discharged to:: Private residence Living Arrangements: Alone;Children Available Help at Discharge: Family Type of Home: House Home Access: Stairs to enter   [employer] of Steps: 1 Home Layout: One level Home Equipment: [place] - 2 wheels;Walker - 4 wheels;Bedside commode;Tub bench;Grab bars - tub/shower;Hand held shower head Additional Comments: plan to go to daughters upon discharge for a few days    Prior Function Level of Independence: Independent               Hand Dominance        Extremity/Trunk Assessment   Upper Extremity Assessment Upper Extremity Assessment: Overall WFL for tasks assessed    Lower Extremity Assessment Lower Extremity Assessment: RLE deficits/detail RLE Deficits / Details: decreased active dorsiflexion associated with pain , does not tolerate dorsi beyond neutral       Communication   Communication: No difficulties  Cognition Arousal/Alertness:  Awake/alert Behavior During Therapy: WFL for tasks assessed/performed Overall Cognitive Status: Within Functional Limits for tasks assessed                      General Comments      Exercises     Assessment/Plan    PT Assessment Patient needs continued PT services  PT Problem List Decreased strength;Decreased range of motion;Decreased activity tolerance;Pain;Decreased mobility          PT Treatment Interventions DME instruction;Gait training;Functional mobility training;Therapeutic activities;Patient/family education;Therapeutic exercise     PT Goals (Current goals can be found in the Care Plan section)  Acute Rehab PT Goals Patient Stated Goal: to walk without pain PT Goal Formulation: With patient Time For Goal Achievement: 03/31/16 Potential to Achieve Goals: Good    Frequency Min 3X/week   Barriers to discharge        Co-evaluation               End of Session   Activity Tolerance: Patient tolerated treatment well Patient left: in bed;with call bell/phone within reach;with bed alarm set Nurse Communication: Mobility status    Functional Assessment Tool Used: clinical judgement Functional Limitation: Mobility: Walking and moving around Mobility: Walking and Moving Around Current Status (Z6109): At least 20 percent but less than 40 percent impaired, limited or restricted Mobility: Walking and Moving Around Goal Status [phone]): 0 percent impaired, limited or restricted    Time: 1348-1415 PT Time Calculation (min) (ACUTE ONLY): 27 min   Charges:     PT Treatments $Therapeutic Activity: 8-22 mins   PT G Codes:   PT G-Codes **NOT FOR INPATIENT CLASS** Functional Assessment Tool Used: clinical judgement Functional Limitation: Mobility: Walking and moving around Mobility: Walking and Moving Around Current Status (U9811): At least 20 percent but less than 40 percent impaired, limited or restricted Mobility: Walking and Moving Around Goal Status [phone]): 0 percent impaired, limited or restricted    [name] [name] 03/24/2016, 2:50 PM Tresa Endo PT [phone]

## 2016-03-24 NOTE — Progress Notes (Signed)
[facility] PROGRESS NOTE    Progress Note  [name] [name]  VEH:[id number] DOB: Aug 25, 1940 DOA: 03/23/2016 PCP: [name], MD     Brief Narrative:   [name] [name] is an 76 y.o. female with medical history significant of HTN, HLD, atrial flutter, CHF last EF 20-25%in 01/2016, b/l hydronephrosis s/p right ureteral stent in 02/2016; who presents after having a fall in the grocery store today around 3 PM. Patient states that she was walking and she felt her legs getting weak.Image of the knees bilateral shoulder fractures, lab work showed a white blood cell count of 9 with a creatinine of 2.0 her previous baseline is 1.3-1.6  Assessment/Plan:   Possible UTI (urinary tract infection): UA showed multiple red blood cells negative nitrates and rare bacteria's. She has no leukocytosis, no fevers. We'll continue IV Rocephin, urine cultures are pending.  Acute kidney injury: Baseline creatinine 1.8.6 question dehydration Antihypertensive medication, except for metoprolol and Imdur.  Chronic systolic heart failure: Lasix and hydralazine. To allow renal perfusion.  Diabetes mellitus type 2: With [name] [name]'s A1c of 6.0. Continue sliding scale insulin. Continue sliding scale insulin.   DVT prophylaxis: lovenox Family Communication:none Disposition Plan/Barrier to D/C: unable to determine Code Status:     Code Status Orders        Start     Ordered   03/24/16 0010  Full code  Continuous     03/24/16 0014    Code Status History    Date Active Date Inactive Code Status Order ID Comments User Context   01/23/2016  4:36 PM 01/26/2016  8:09 PM Full Code [id number]  Mendel Corning, MD Inpatient   10/18/2015  4:43 PM 10/23/2015  4:10 PM Full Code [id number]  Willia Craze, NP ED   08/29/2015  3:56 AM 09/08/2015  9:52 PM Full Code [id number]  Jani Gravel, MD ED   10/16/2014  9:26 PM 10/18/2014  5:21 PM Full Code [id number]  Reubin Milan, MD Inpatient   06/07/2012 10:24 PM 06/12/2012   7:37 PM Full Code [id number]  Merton Border, MD Inpatient   05/07/2012  4:41 PM 05/08/2012  3:36 PM Full Code [id number]  Thurnell Lose, MD Inpatient        IV Access:    Peripheral IV   Procedures and diagnostic studies:   Dg Knee 2 Views Left  Result Date: 03/23/2016 CLINICAL DATA:  [place] to knees, bilateral knee pain EXAM: LEFT KNEE - 1-2 VIEW COMPARISON:  None. FINDINGS: Surgical clips in the soft tissues. Extensive vascular calcification. No fracture or dislocation. Mild medial and lateral joint space compartment degenerative changes. IMPRESSION: No acute osseous abnormality.  Dense vascular calcifications Electronically Signed   By: Donavan Foil M.D.   On: 03/23/2016 20:10   Dg Knee 2 Views Right  Result Date: 03/23/2016 CLINICAL DATA:  Fall with pain in the knees EXAM: RIGHT KNEE - 1-2 VIEW COMPARISON:  None. FINDINGS: Multiple soft tissue surgical clips. Dense vascular calcifications. No acute displaced fracture or dislocation. Mild narrowing of the medial and lateral joint space compartments. Mild joint space calcifications are noted. IMPRESSION: 1. No acute osseous abnormality 2. Mild degenerative changes.  Chondrocalcinosis. Electronically Signed   By: Donavan Foil M.D.   On: 03/23/2016 20:11   Dg Abdomen 1 View  Result Date: 03/23/2016 CLINICAL DATA:  Stent placement EXAM: ABDOMEN - 1 VIEW COMPARISON:  02/13/2016, 03/27/2015, 10/18/2015 FINDINGS: Right-sided ureteral stent with proximal aspect overlying expected location of right kidney and distal portion overlying expected  location of the bladder. Coarse calcification in the left upper quadrant, could reflect pleural calcification. Multiple surgical clips in the upper abdomen. Nonobstructed gas pattern. Extensive vascular calcification. No discrete calcifications are identified. IMPRESSION: Right-sided ureteral stent as above.  Nonobstructed gas pattern Electronically Signed   By: Donavan Foil M.D.   On: 03/23/2016 23:21      Medical Consultants:    None.  Anti-Infectives:   Rocephin  Subjective:    [name] [name] complaining of ankle pain.  Objective:    Vitals:   03/24/16 0158 03/24/16 0200 03/24/16 0300 03/24/16 0415  BP:  (!) 123/52 (!) 145/49 (!) 122/98  Pulse: 71 71 73 69  Resp: 18 23 20 18   Temp:    98.5 F (36.9 C)  TempSrc:      SpO2: 95% 97% 97% 98%  Weight:    64.1 kg (141 lb 6.4 oz)  Height:    5\' 6"  (1.676 m)   No intake or output data in the 24 hours ending 03/24/16 0737 Filed Weights   03/23/16 1934 03/24/16 0415  Weight: 63 kg (139 lb) 64.1 kg (141 lb 6.4 oz)    Exam: General exam: In no acute distress. Respiratory system: Good air movement and clear to auscultation. Cardiovascular system: S1 & S2 heard, RRR. No JVD. Gastrointestinal system: Abdomen is nondistended, soft and nontender.  Central nervous system: Alert and oriented. No focal neurological deficits. Extremities: No pedal edema. Skin: No rashes, lesions or ulcers Psychiatry: Judgement and insight appear normal. Mood & affect appropriate.    Data Reviewed:    Labs: Basic Metabolic Panel:  Recent Labs Lab 03/19/16 1059 03/23/16 1942  NA 139 141  K 4.4 4.5  CL 108 106  CO2 25 21*  GLUCOSE 107* 96  BUN 42* 53*  CREATININE 1.68* 2.03*  CALCIUM 8.9 8.9   GFR Estimated Creatinine Clearance: 22.8 mL/min (by C-G formula based on SCr of 2.03 mg/dL (H)). Liver Function Tests: No results for input(s): AST, ALT, ALKPHOS, BILITOT, PROT, ALBUMIN in the last 168 hours. No results for input(s): LIPASE, AMYLASE in the last 168 hours. No results for input(s): AMMONIA in the last 168 hours. Coagulation profile No results for input(s): INR, PROTIME in the last 168 hours.  CBC:  Recent Labs Lab 03/23/16 1942  WBC 9.0  HGB 10.5*  HCT 33.0*  MCV 88.9  PLT 194   Cardiac Enzymes: No results for input(s): CKTOTAL, CKMB, CKMBINDEX, TROPONINI in the last 168 hours. BNP (last 3  results)  Recent Labs  02/09/16 1334  PROBNP 119.0*   CBG:  Recent Labs Lab 03/24/16 0118  GLUCAP 156*   D-Dimer: No results for input(s): DDIMER in the last 72 hours. Hgb A1c: No results for input(s): HGBA1C in the last 72 hours. Lipid Profile: No results for input(s): CHOL, HDL, LDLCALC, TRIG, CHOLHDL, LDLDIRECT in the last 72 hours. Thyroid function studies: No results for input(s): TSH, T4TOTAL, T3FREE, THYROIDAB in the last 72 hours.  Invalid input(s): FREET3 Anemia work up: No results for input(s): VITAMINB12, FOLATE, FERRITIN, TIBC, IRON, RETICCTPCT in the last 72 hours. Sepsis Labs:  Recent Labs Lab 03/23/16 1942  WBC 9.0   Microbiology No results found for this or any previous visit (from the past 240 hour(s)).   Medications:   . aspirin  81 mg Oral Daily  . calcium-vitamin D  1 tablet Oral Q breakfast  . ferrous sulfate  325 mg Oral QPM  . heparin  5,000 Units Subcutaneous Q8H  .  hydrALAZINE  10 mg Oral BID  . insulin aspart  0-5 Units Subcutaneous QHS  . insulin aspart  0-9 Units Subcutaneous TID WC  . isosorbide mononitrate  30 mg Oral Daily  . latanoprost  1 drop Both Eyes QHS  . metoprolol succinate  25 mg Oral Daily  . pantoprazole  40 mg Oral Daily  . rosuvastatin  20 mg Oral Daily  . vitamin C  500 mg Oral QHS   Continuous Infusions:  Time spent: 25 min   LOS: 0 days   Charlynne Cousins  [facility] Pager [phone]  *Please refer to [email or web address], password [email or web address] to get updated schedule on who will round on this patient, as hospitalists switch teams weekly. If 7PM-7AM, please contact night-coverage at [email or web address], password [email or web address] for any overnight needs.  03/24/2016, 7:37 AM

## 2016-03-24 NOTE — Progress Notes (Signed)
CSW consulted to assist with home meds. CSW is unable to assist with this request.  CSW signing off.  Werner Lean LCSW [phone]

## 2016-03-24 NOTE — ED Notes (Signed)
Patient placed on 2L o2 [address]

## 2016-03-25 DIAGNOSIS — E118 Type 2 diabetes mellitus with unspecified complications: Secondary | ICD-10-CM

## 2016-03-25 DIAGNOSIS — W19XXXA Unspecified fall, initial encounter: Secondary | ICD-10-CM

## 2016-03-25 DIAGNOSIS — Z794 Long term (current) use of insulin: Secondary | ICD-10-CM

## 2016-03-25 LAB — GLUCOSE, CAPILLARY
GLUCOSE-CAPILLARY: 97 mg/dL (ref 65–99)
Glucose-Capillary: 107 mg/dL — ABNORMAL HIGH (ref 65–99)

## 2016-03-25 MED ORDER — HYDROCODONE-ACETAMINOPHEN 5-325 MG PO TABS: ORAL_TABLET | ORAL | 0 refills | Status: DC

## 2016-03-25 MED ORDER — SULFAMETHOXAZOLE-TRIMETHOPRIM 800-160 MG PO TABS: 1.0000 | ORAL_TABLET | Freq: Two times a day (BID) | ORAL | 0 refills | Status: DC

## 2016-03-25 NOTE — Care Management Note (Signed)
Case Management Note  Patient Details  Name: KAPRICE KAGE MRN: [id number] Date of Birth: 08/12/1940  Subjective/Objective:            76 yo admitted with Fall.        Action/Plan: Pt from home alone and plans to stay with daughter [name] for a week or two at discharge. This CM offered choice for home health services and [facility] was chosen. [facility] rep contacted for referral. Daughter's phone number give to [facility] rep [phone]). Pt has all equipment needed and no other CM needs communicated.  Expected Discharge Date:  03/25/16               Expected Discharge Plan:  [place]  In-House Referral:     Discharge planning Services  CM Consult  Post Acute Care Choice:  Home Health Choice offered to:  Patient  DME Arranged:    DME Agency:     HH Arranged:  PT [address]:  [place]  Status of Service:  In process, will continue to follow  If discussed at Long Length of Stay Meetings, dates discussed:    Additional CommentsLynnell Catalan, RN 03/25/2016, 10:00 AM  [phone]

## 2016-03-25 NOTE — Progress Notes (Signed)
Physical Therapy Treatment Patient Details Name: [name] [name] MRN: [id number] DOB: [birth year] Today's Date: 03/25/2016    History of Present Illness [name] [name] is an 76 y.o. female with medical history significant of HTN, HLD, atrial flutter, CHF last EF 20-25%in 01/2016, b/l hydronephrosis s/p right ureteral stent in 02/2016; who presents after having a fall on 03/23/16. Found with possible UTI and Aacute  kidney failure. Pt now has a right ankle brace that seems to be helping her with pain managment so that she is able bear weight more easiily     PT Comments    Pt is much improved today with less pain and increased mobility and gait with walker.  She feels that she will be able to go home with daughter and safely get into home.  She is looking forward to getting stronger with home health PT   Follow Up Recommendations  Home health PT;Supervision/Assistance - 24 hour     Equipment Recommendations  None recommended by PT    Recommendations for Other Services       Precautions / Restrictions Restrictions Weight Bearing Restrictions: No    Mobility  Bed Mobility Overal bed mobility: Modified Independent                Transfers Overall transfer level: Needs assistance Equipment used: Rolling walker (2 wheeled) Transfers: Sit to/from Stand Sit to Stand: Min assist         General transfer comment: extra time, occasional verbal cues to push up with arms   Ambulation/Gait Ambulation/Gait assistance: Min assist Ambulation Distance (Feet): 75 Feet (x2) Assistive device: Rolling walker (2 wheeled) Gait Pattern/deviations: Step-to pattern;Decreased step length - left;Decreased stance time - right;Antalgic (decreased weight bearing on right, RW weigh bearing assist ) Gait velocity: slow   General Gait Details: pt improved with verbal cues for posture and technique.  did better after RW handles lowered so she could use arms for more weight bearing assist     Stairs            Wheelchair Mobility    Modified Rankin (Stroke Patients Only)       Balance Overall balance assessment: Modified Independent (with RW for assist in standing)                                  Cognition Arousal/Alertness: Awake/alert Behavior During Therapy: WFL for tasks assessed/performed Overall Cognitive Status: Within Functional Limits for tasks assessed                      Exercises General Exercises - Upper Extremity Shoulder Flexion: AROM;Both;5 reps General Exercises - Lower Extremity Gluteal Sets: Strengthening;Both;5 reps Heel Slides: Strengthening;Both;5 reps Straight Leg Raises: Strengthening;Both;5 reps    General Comments General comments (skin integrity, edema, etc.): ankle support on right ankle       Pertinent Vitals/Pain Pain Assessment: 0-10 Pain Score: 4  (pt reports her ankle pain is much better than yesterday ) Pain Location: right ankle Pain Descriptors / Indicators: Discomfort;Sharp Pain Intervention(s): Monitored during session;Repositioned    Home Living                      Prior Function            PT Goals (current goals can now be found in the care plan section) Acute Rehab PT Goals Patient Stated Goal: to walk without  pain Progress towards PT goals: Progressing toward goals    Frequency    Min 3X/week      PT Plan Current plan remains appropriate    Co-evaluation             End of Session   Activity Tolerance: Patient tolerated treatment well Patient left: in chair;with call bell/phone within reach;with chair alarm set     Time: 1071-2524 PT Time Calculation (min) (ACUTE ONLY): 28 min  Charges:  $Gait Training: 8-22 mins $Therapeutic Exercise: 8-22 mins                    G Codes:     Teresa K. Owens Shark, PT  [name] [name] 03/25/2016, 9:42 AM

## 2016-03-25 NOTE — Discharge Summary (Signed)
Physician Discharge Summary  [name] [name] RCV:[id number] DOB: [birth year] DOA: 03/23/2016  PCP: [name], MD  Admit date: 03/23/2016 Discharge date: 03/25/2016  Admitted From: home Disposition:  Home  Recommendations for Outpatient Follow-up:  1. Follow up with PCP in 1-2 weeks 2. Please obtain BMP/CBC in one week 3. Return home with physical therapy.  Home Health:home Equipment/Devices:none  Discharge Condition:stable CODE STATUS:full Diet recommendation: Heart Healthy  Brief/Interim Summary: 76 y.o. female with medical history significant ofHTN, HLD, atrial flutter, CHF last EF 20-25%in 01/2016, b/l hydronephrosis s/p right ureteral stent in 02/2016; who presents after having a fall in the grocery store today around 3 PM. Patient states that she was walking and she felt her legs getting weak.Image of the knees bilateral shoulder fractures, lab work showed a white blood cell count of 9 with a creatinine of 2.0 her previous baseline is 1.3-1.6  Discharge Diagnoses:  Principal Problem:   Fall Active Problems:   Diabetes mellitus, type II ([place])   Chronic combined systolic and diastolic congestive heart failure (HCC)   Hypothyroidism   Acute kidney injury superimposed on chronic kidney disease (HCC)   UTI (urinary tract infection)  Possible UTI (urinary tract infection): UA showed multiple red blood cells negative nitrates and rare bacteria's. She has no leukocytosis, no fevers. We'll continue IV Rocephin, urine cultures are negative till date. Her history seem convincing for dysuria and with a new fall I am concerned about a possible urinary tract infection and patient with a recent urological instrumentation. We'll start her on Bactrim which she will continue as an outpatient.  Acute kidney injury: Baseline creatinine 1.8, probably due to to UTI. She was started on IV fluid hydration with Cr. returning to baseline.  Chronic systolic heart failure: Antihypertensive  medications held on admission. She will resume her regimen at home no changes were made.  Diabetes mellitus type 2: No changes made to her regimen, her A1c is 6.0.   Discharge Instructions  Discharge Instructions    Diet - low sodium heart healthy    Complete by:  As directed    Increase activity slowly    Complete by:  As directed      Allergies as of 03/25/2016   No Known Allergies     Medication List    STOP taking these medications   methocarbamol 500 MG tablet Commonly known as:  ROBAXIN     TAKE these medications   aspirin 81 MG chewable tablet Chew 81 mg by mouth daily. What changed:  Another medication with the same name was removed. Continue taking this medication, and follow the directions you see here.   calcium-vitamin D 500-200 MG-UNIT tablet Commonly known as:  OSCAL WITH D Take 1 tablet by mouth every morning.   Cranberry 250 MG Tabs Take 250 mg by mouth daily.   ferrous sulfate 325 (65 FE) MG tablet Take 325 mg by mouth every evening.   furosemide 20 MG tablet Commonly known as:  LASIX Take 20 mg by mouth every other day.   hydrALAZINE 10 MG tablet Commonly known as:  APRESOLINE Take 1 tablet (10 mg total) by mouth every 8 (eight) hours. What changed:  when to take this   HYDROcodone-acetaminophen 5-325 MG tablet Commonly known as:  NORCO/VICODIN Take 1 tablet every 12 hours as needed for back pain   insulin aspart 100 UNIT/ML FlexPen Commonly known as:  NOVOLOG Inject 8 units three times a day with meals What changed:  how much to take  how  to take this  when to take this  additional instructions   isosorbide mononitrate 30 MG 24 hr tablet Commonly known as:  IMDUR Take 1 tablet (30 mg total) by mouth daily.   latanoprost 0.005 % ophthalmic solution Commonly known as:  XALATAN Place 1 drop into both eyes at bedtime.   LEVEMIR FLEXTOUCH 100 UNIT/ML Pen Generic drug:  Insulin Detemir INJECT 22 UNITS AT BEDTIME What  changed:  See the new instructions.   levothyroxine 125 MCG tablet Commonly known as:  SYNTHROID, LEVOTHROID Take 125 mcg by mouth daily before breakfast.   metoprolol succinate 25 MG 24 hr tablet Commonly known as:  TOPROL-XL Take 1 tablet (25 mg total) by mouth daily.   omeprazole 20 MG capsule Commonly known as:  PRILOSEC TAKE 1 CAPSULE DAILY What changed:  See the new instructions.   Pen Needles 31G X 6 MM Misc Use to inject insulin 4 times per day.   polyethylene glycol packet Commonly known as:  MIRALAX / GLYCOLAX Take 17 g by mouth daily as needed for moderate constipation.   rosuvastatin 20 MG tablet Commonly known as:  CRESTOR Take 0.5 tablets (10 mg total) by mouth daily. What changed:  how much to take   sulfamethoxazole-trimethoprim 800-160 MG tablet Commonly known as:  BACTRIM DS,SEPTRA DS Take 1 tablet by mouth 2 (two) times daily.   VENTOLIN HFA 108 (90 Base) MCG/ACT inhaler Generic drug:  albuterol inhale 2 puffs by mouth INTO THE LUNGS every 6 hours if needed for wheezing   VITAMIN B 12 PO Take 1 tablet by mouth daily.   vitamin C 500 MG tablet Commonly known as:  ASCORBIC ACID Take 500 mg by mouth at bedtime.   Vitamin D (Ergocalciferol) 50000 units Caps capsule Commonly known as:  DRISDOL TAKE 1 CAPSULE BY MOUTH EVERY 14 DAYS       No Known Allergies  Consultations:  None   Procedures/Studies: Dg Knee 2 Views Left  Result Date: 03/23/2016 CLINICAL DATA:  [place] to knees, bilateral knee pain EXAM: LEFT KNEE - 1-2 VIEW COMPARISON:  None. FINDINGS: Surgical clips in the soft tissues. Extensive vascular calcification. No fracture or dislocation. Mild medial and lateral joint space compartment degenerative changes. IMPRESSION: No acute osseous abnormality.  Dense vascular calcifications Electronically Signed   By: Donavan Foil M.D.   On: 03/23/2016 20:10   Dg Knee 2 Views Right  Result Date: 03/23/2016 CLINICAL DATA:  Fall with pain in the  knees EXAM: RIGHT KNEE - 1-2 VIEW COMPARISON:  None. FINDINGS: Multiple soft tissue surgical clips. Dense vascular calcifications. No acute displaced fracture or dislocation. Mild narrowing of the medial and lateral joint space compartments. Mild joint space calcifications are noted. IMPRESSION: 1. No acute osseous abnormality 2. Mild degenerative changes.  Chondrocalcinosis. Electronically Signed   By: Donavan Foil M.D.   On: 03/23/2016 20:11   Dg Ankle Complete Right  Result Date: 03/24/2016 CLINICAL DATA:  Patient states fall at home last pm, c/o pain entire right ankle EXAM: RIGHT ANKLE - COMPLETE 3+ VIEW COMPARISON:  None. FINDINGS: Ankle mortise intact. The talar dome is normal. No malleolar fracture. The calcaneus is normal. IMPRESSION: No fracture or dislocation. Electronically Signed   By: Suzy Bouchard M.D.   On: 03/24/2016 08:52   Dg Abdomen 1 View  Result Date: 03/23/2016 CLINICAL DATA:  Stent placement EXAM: ABDOMEN - 1 VIEW COMPARISON:  02/13/2016, 03/27/2015, 10/18/2015 FINDINGS: Right-sided ureteral stent with proximal aspect overlying expected location of right kidney and  distal portion overlying expected location of the bladder. Coarse calcification in the left upper quadrant, could reflect pleural calcification. Multiple surgical clips in the upper abdomen. Nonobstructed gas pattern. Extensive vascular calcification. No discrete calcifications are identified. IMPRESSION: Right-sided ureteral stent as above.  Nonobstructed gas pattern Electronically Signed   By: Donavan Foil M.D.   On: 03/23/2016 23:21       Subjective: No complains except for her ankle pain.  Discharge Exam: Vitals:   03/24/16 2235 03/25/16 0612  BP: (!) 157/93 (!) 175/53  Pulse: (!) 55 67  Resp: 20 20  Temp: 98 F (36.7 C) 97.9 F (36.6 C)   Vitals:   03/24/16 0415 03/24/16 1449 03/24/16 2235 03/25/16 0612  BP: (!) 122/98 125/76 (!) 157/93 (!) 175/53  Pulse: 69 (!) 55 (!) 55 67  Resp: 18 17 20  20   Temp: 98.5 F (36.9 C) 98.3 F (36.8 C) 98 F (36.7 C) 97.9 F (36.6 C)  TempSrc:  Oral Oral Oral  SpO2: 98% 100% 96% 100%  Weight: 64.1 kg (141 lb 6.4 oz)   64.1 kg (141 lb 6.4 oz)  Height: 5\' 6"  (1.676 m)       General: Pt is alert, awake, not in acute distress Cardiovascular: RRR, S1/S2 +, no rubs, no gallops Respiratory: CTA bilaterally, no wheezing, no rhonchi Abdominal: Soft, NT, ND, bowel sounds + Extremities: no edema, no cyanosis    The results of significant diagnostics from this hospitalization (including imaging, microbiology, ancillary and laboratory) are listed below for reference.     Microbiology: No results found for this or any previous visit (from the past 240 hour(s)).   Labs: BNP (last 3 results)  Recent Labs  01/23/16 1413  BNP 5,465.0*   Basic Metabolic Panel:  Recent Labs Lab 03/19/16 1059 03/23/16 1942 03/24/16 0756  NA 139 141 139  K 4.4 4.5 4.1  CL 108 106 112*  CO2 25 21* 20*  GLUCOSE 107* 96 120*  BUN 42* 53* 47*  CREATININE 1.68* 2.03* 1.64*  CALCIUM 8.9 8.9 8.2*   Liver Function Tests: No results for input(s): AST, ALT, ALKPHOS, BILITOT, PROT, ALBUMIN in the last 168 hours. No results for input(s): LIPASE, AMYLASE in the last 168 hours. No results for input(s): AMMONIA in the last 168 hours. CBC:  Recent Labs Lab 03/23/16 1942 03/24/16 0756  WBC 9.0 7.8  HGB 10.5* 9.5*  HCT 33.0* 29.7*  MCV 88.9 89.5  PLT 194 184   Cardiac Enzymes: No results for input(s): CKTOTAL, CKMB, CKMBINDEX, TROPONINI in the last 168 hours. BNP: Invalid input(s): POCBNP CBG:  Recent Labs Lab 03/24/16 0118 03/24/16 0739 03/24/16 1157 03/24/16 1828 03/25/16 0726  GLUCAP 156* 111* 136* 137* 97   D-Dimer No results for input(s): DDIMER in the last 72 hours. Hgb A1c No results for input(s): HGBA1C in the last 72 hours. Lipid Profile No results for input(s): CHOL, HDL, LDLCALC, TRIG, CHOLHDL, LDLDIRECT in the last 72  hours. Thyroid function studies  Recent Labs  03/24/16 0756  TSH 0.235*   Anemia work up No results for input(s): VITAMINB12, FOLATE, FERRITIN, TIBC, IRON, RETICCTPCT in the last 72 hours. Urinalysis    Component Value Date/Time   COLORURINE YELLOW 03/23/2016 1717   APPEARANCEUR HAZY (A) 03/23/2016 1717   LABSPEC 1.013 03/23/2016 1717   PHURINE 5.0 03/23/2016 1717   GLUCOSEU NEGATIVE 03/23/2016 1717   GLUCOSEU NEGATIVE 02/02/2016 0926   HGBUR MODERATE (A) 03/23/2016 1717   BILIRUBINUR NEGATIVE 03/23/2016 1717  KETONESUR NEGATIVE 03/23/2016 1717   PROTEINUR 100 (A) 03/23/2016 1717   UROBILINOGEN 0.2 02/02/2016 0926   NITRITE NEGATIVE 03/23/2016 1717   LEUKOCYTESUR LARGE (A) 03/23/2016 1717   Sepsis Labs Invalid input(s): PROCALCITONIN,  WBC,  LACTICIDVEN Microbiology No results found for this or any previous visit (from the past 240 hour(s)).   Time coordinating discharge: Over 30 minutes  SIGNED:   Charlynne Cousins, MD  [facility] 03/25/2016, 7:56 AM Pager   If 7PM-7AM, please contact night-coverage [email or web address] Password [email or web address]

## 2016-03-25 NOTE — Progress Notes (Signed)
Nursing Discharge Summary  Patient ID: [name] [name] MRN: [id number] DOB/AGE: [birth year] 75 y.o.  Admit date: 03/23/2016 Discharge date: 03/25/2016  Discharged Condition: good  Disposition: 01-Home or Self Care    Prescriptions Given: Prescriptions for Vicodin and Bactrim given for patient.  Patient follow up appointments and medications discussed.  Patient and daughter both verbalized understanding without further questions.    Means of Discharge: Patient to be taken downstairs via wheelchair to be discharged home via private vehicle.  Signed: Buel Ream 03/25/2016, 3:31 PM

## 2016-03-26 ENCOUNTER — Other Ambulatory Visit: Payer: Self-pay | Admitting: *Deleted

## 2016-03-26 LAB — URINE CULTURE: Culture: 40000 — AB

## 2016-03-26 MED ORDER — ISOSORBIDE MONONITRATE ER 30 MG PO TB24: 30.0000 mg | ORAL_TABLET | Freq: Every day | ORAL | 3 refills | Status: DC

## 2016-03-26 NOTE — Telephone Encounter (Signed)
Pt is aware of the directions for the crestor via voicemail

## 2016-03-26 NOTE — Telephone Encounter (Signed)
I told her when she was in the office to take the full tablet of Crestor

## 2016-03-29 DIAGNOSIS — N390 Urinary tract infection, site not specified: Secondary | ICD-10-CM | POA: Diagnosis not present

## 2016-03-29 DIAGNOSIS — I255 Ischemic cardiomyopathy: Secondary | ICD-10-CM | POA: Diagnosis not present

## 2016-03-29 DIAGNOSIS — Z951 Presence of aortocoronary bypass graft: Secondary | ICD-10-CM | POA: Diagnosis not present

## 2016-03-29 DIAGNOSIS — E1122 Type 2 diabetes mellitus with diabetic chronic kidney disease: Secondary | ICD-10-CM | POA: Diagnosis not present

## 2016-03-29 DIAGNOSIS — E1151 Type 2 diabetes mellitus with diabetic peripheral angiopathy without gangrene: Secondary | ICD-10-CM | POA: Diagnosis not present

## 2016-03-29 DIAGNOSIS — Z794 Long term (current) use of insulin: Secondary | ICD-10-CM | POA: Diagnosis not present

## 2016-03-29 DIAGNOSIS — K219 Gastro-esophageal reflux disease without esophagitis: Secondary | ICD-10-CM | POA: Diagnosis not present

## 2016-03-29 DIAGNOSIS — I5042 Chronic combined systolic (congestive) and diastolic (congestive) heart failure: Secondary | ICD-10-CM | POA: Diagnosis not present

## 2016-03-29 DIAGNOSIS — Z9181 History of falling: Secondary | ICD-10-CM | POA: Diagnosis not present

## 2016-03-29 DIAGNOSIS — E785 Hyperlipidemia, unspecified: Secondary | ICD-10-CM | POA: Diagnosis not present

## 2016-03-29 DIAGNOSIS — I2510 Atherosclerotic heart disease of native coronary artery without angina pectoris: Secondary | ICD-10-CM | POA: Diagnosis not present

## 2016-03-29 DIAGNOSIS — I13 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 1 through stage 4 chronic kidney disease, or unspecified chronic kidney disease: Secondary | ICD-10-CM | POA: Diagnosis not present

## 2016-03-29 DIAGNOSIS — Z7982 Long term (current) use of aspirin: Secondary | ICD-10-CM | POA: Diagnosis not present

## 2016-03-29 DIAGNOSIS — N183 Chronic kidney disease, stage 3 (moderate): Secondary | ICD-10-CM | POA: Diagnosis not present

## 2016-03-31 DIAGNOSIS — N183 Chronic kidney disease, stage 3 (moderate): Secondary | ICD-10-CM | POA: Diagnosis not present

## 2016-03-31 DIAGNOSIS — I13 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 1 through stage 4 chronic kidney disease, or unspecified chronic kidney disease: Secondary | ICD-10-CM | POA: Diagnosis not present

## 2016-03-31 DIAGNOSIS — N390 Urinary tract infection, site not specified: Secondary | ICD-10-CM | POA: Diagnosis not present

## 2016-03-31 DIAGNOSIS — E1122 Type 2 diabetes mellitus with diabetic chronic kidney disease: Secondary | ICD-10-CM | POA: Diagnosis not present

## 2016-03-31 DIAGNOSIS — I2510 Atherosclerotic heart disease of native coronary artery without angina pectoris: Secondary | ICD-10-CM | POA: Diagnosis not present

## 2016-03-31 DIAGNOSIS — I5042 Chronic combined systolic (congestive) and diastolic (congestive) heart failure: Secondary | ICD-10-CM | POA: Diagnosis not present

## 2016-04-02 ENCOUNTER — Other Ambulatory Visit (HOSPITAL_COMMUNITY): Payer: Medicare Other

## 2016-04-02 ENCOUNTER — Other Ambulatory Visit: Payer: Self-pay | Admitting: *Deleted

## 2016-04-02 ENCOUNTER — Telehealth: Payer: Self-pay | Admitting: Cardiovascular Disease

## 2016-04-02 DIAGNOSIS — E1122 Type 2 diabetes mellitus with diabetic chronic kidney disease: Secondary | ICD-10-CM | POA: Diagnosis not present

## 2016-04-02 DIAGNOSIS — N183 Chronic kidney disease, stage 3 (moderate): Secondary | ICD-10-CM | POA: Diagnosis not present

## 2016-04-02 DIAGNOSIS — I13 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 1 through stage 4 chronic kidney disease, or unspecified chronic kidney disease: Secondary | ICD-10-CM | POA: Diagnosis not present

## 2016-04-02 DIAGNOSIS — N390 Urinary tract infection, site not specified: Secondary | ICD-10-CM | POA: Diagnosis not present

## 2016-04-02 DIAGNOSIS — I2510 Atherosclerotic heart disease of native coronary artery without angina pectoris: Secondary | ICD-10-CM | POA: Diagnosis not present

## 2016-04-02 DIAGNOSIS — I5042 Chronic combined systolic (congestive) and diastolic (congestive) heart failure: Secondary | ICD-10-CM | POA: Diagnosis not present

## 2016-04-02 NOTE — Telephone Encounter (Signed)
New Message    *STAT* If patient is at the pharmacy, call can be transferred to refill team.   1. Which medications need to be refilled? (please list name of each medication and dose if known) hydrALAZINE (APRESOLINE) 10 MG tablet  2. Which pharmacy/location (including street and city if local pharmacy) is medication to be sent to? [facility] [address]  3. Do they need a 30 day or 90 day supply? 90 day

## 2016-04-02 NOTE — Telephone Encounter (Signed)
According to the chart patient is to be taking hydralazine 10 mg TID but the chart reports she is taking BID. Will forward to dr berry to confirm which dose the patient needs to take.

## 2016-04-04 NOTE — Telephone Encounter (Signed)
TID is the approp dosing

## 2016-04-05 ENCOUNTER — Other Ambulatory Visit: Payer: Self-pay | Admitting: Endocrinology

## 2016-04-05 MED ORDER — HYDRALAZINE HCL 10 MG PO TABS: 10.0000 mg | ORAL_TABLET | Freq: Three times a day (TID) | ORAL | 5 refills | Status: DC

## 2016-04-05 NOTE — Telephone Encounter (Signed)
Med refilled, called patient to inform, no answer or VM pickup when dialed.

## 2016-04-05 NOTE — Telephone Encounter (Signed)
Pt calling back to request refill from previous message, pt also said she is taking the Hydralazine 10 mg bid, Berry's note states TID is the appropriate dosage-[facility]

## 2016-04-06 DIAGNOSIS — N183 Chronic kidney disease, stage 3 (moderate): Secondary | ICD-10-CM | POA: Diagnosis not present

## 2016-04-06 DIAGNOSIS — I5042 Chronic combined systolic (congestive) and diastolic (congestive) heart failure: Secondary | ICD-10-CM | POA: Diagnosis not present

## 2016-04-06 DIAGNOSIS — I13 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 1 through stage 4 chronic kidney disease, or unspecified chronic kidney disease: Secondary | ICD-10-CM | POA: Diagnosis not present

## 2016-04-06 DIAGNOSIS — E1122 Type 2 diabetes mellitus with diabetic chronic kidney disease: Secondary | ICD-10-CM | POA: Diagnosis not present

## 2016-04-06 DIAGNOSIS — I2510 Atherosclerotic heart disease of native coronary artery without angina pectoris: Secondary | ICD-10-CM | POA: Diagnosis not present

## 2016-04-06 DIAGNOSIS — N390 Urinary tract infection, site not specified: Secondary | ICD-10-CM | POA: Diagnosis not present

## 2016-04-08 DIAGNOSIS — E1122 Type 2 diabetes mellitus with diabetic chronic kidney disease: Secondary | ICD-10-CM | POA: Diagnosis not present

## 2016-04-08 DIAGNOSIS — I13 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 1 through stage 4 chronic kidney disease, or unspecified chronic kidney disease: Secondary | ICD-10-CM | POA: Diagnosis not present

## 2016-04-08 DIAGNOSIS — I2510 Atherosclerotic heart disease of native coronary artery without angina pectoris: Secondary | ICD-10-CM | POA: Diagnosis not present

## 2016-04-08 DIAGNOSIS — N390 Urinary tract infection, site not specified: Secondary | ICD-10-CM | POA: Diagnosis not present

## 2016-04-08 DIAGNOSIS — I5042 Chronic combined systolic (congestive) and diastolic (congestive) heart failure: Secondary | ICD-10-CM | POA: Diagnosis not present

## 2016-04-08 DIAGNOSIS — N183 Chronic kidney disease, stage 3 (moderate): Secondary | ICD-10-CM | POA: Diagnosis not present

## 2016-04-12 ENCOUNTER — Other Ambulatory Visit (HOSPITAL_COMMUNITY): Payer: Medicare Other

## 2016-04-13 ENCOUNTER — Ambulatory Visit: Payer: Medicare Other | Admitting: Podiatry

## 2016-04-13 DIAGNOSIS — I2510 Atherosclerotic heart disease of native coronary artery without angina pectoris: Secondary | ICD-10-CM | POA: Diagnosis not present

## 2016-04-13 DIAGNOSIS — N390 Urinary tract infection, site not specified: Secondary | ICD-10-CM | POA: Diagnosis not present

## 2016-04-13 DIAGNOSIS — E1122 Type 2 diabetes mellitus with diabetic chronic kidney disease: Secondary | ICD-10-CM | POA: Diagnosis not present

## 2016-04-13 DIAGNOSIS — N183 Chronic kidney disease, stage 3 (moderate): Secondary | ICD-10-CM | POA: Diagnosis not present

## 2016-04-13 DIAGNOSIS — I5042 Chronic combined systolic (congestive) and diastolic (congestive) heart failure: Secondary | ICD-10-CM | POA: Diagnosis not present

## 2016-04-13 DIAGNOSIS — I13 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 1 through stage 4 chronic kidney disease, or unspecified chronic kidney disease: Secondary | ICD-10-CM | POA: Diagnosis not present

## 2016-04-15 ENCOUNTER — Ambulatory Visit (HOSPITAL_COMMUNITY): Payer: Medicare Other | Attending: Cardiology

## 2016-04-15 ENCOUNTER — Other Ambulatory Visit: Payer: Self-pay

## 2016-04-15 DIAGNOSIS — I714 Abdominal aortic aneurysm, without rupture: Secondary | ICD-10-CM | POA: Diagnosis not present

## 2016-04-15 DIAGNOSIS — E785 Hyperlipidemia, unspecified: Secondary | ICD-10-CM | POA: Insufficient documentation

## 2016-04-15 DIAGNOSIS — I4892 Unspecified atrial flutter: Secondary | ICD-10-CM | POA: Insufficient documentation

## 2016-04-15 DIAGNOSIS — I340 Nonrheumatic mitral (valve) insufficiency: Secondary | ICD-10-CM | POA: Insufficient documentation

## 2016-04-15 DIAGNOSIS — I10 Essential (primary) hypertension: Secondary | ICD-10-CM | POA: Insufficient documentation

## 2016-04-15 DIAGNOSIS — I071 Rheumatic tricuspid insufficiency: Secondary | ICD-10-CM | POA: Insufficient documentation

## 2016-04-16 DIAGNOSIS — N183 Chronic kidney disease, stage 3 (moderate): Secondary | ICD-10-CM | POA: Diagnosis not present

## 2016-04-16 DIAGNOSIS — E1122 Type 2 diabetes mellitus with diabetic chronic kidney disease: Secondary | ICD-10-CM | POA: Diagnosis not present

## 2016-04-16 DIAGNOSIS — N390 Urinary tract infection, site not specified: Secondary | ICD-10-CM | POA: Diagnosis not present

## 2016-04-16 DIAGNOSIS — I13 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 1 through stage 4 chronic kidney disease, or unspecified chronic kidney disease: Secondary | ICD-10-CM | POA: Diagnosis not present

## 2016-04-16 DIAGNOSIS — I5042 Chronic combined systolic (congestive) and diastolic (congestive) heart failure: Secondary | ICD-10-CM | POA: Diagnosis not present

## 2016-04-16 DIAGNOSIS — I2510 Atherosclerotic heart disease of native coronary artery without angina pectoris: Secondary | ICD-10-CM | POA: Diagnosis not present

## 2016-04-20 ENCOUNTER — Other Ambulatory Visit: Payer: Self-pay

## 2016-04-20 ENCOUNTER — Telehealth: Payer: Self-pay | Admitting: Endocrinology

## 2016-04-20 DIAGNOSIS — N183 Chronic kidney disease, stage 3 (moderate): Secondary | ICD-10-CM | POA: Diagnosis not present

## 2016-04-20 DIAGNOSIS — I2510 Atherosclerotic heart disease of native coronary artery without angina pectoris: Secondary | ICD-10-CM | POA: Diagnosis not present

## 2016-04-20 DIAGNOSIS — N390 Urinary tract infection, site not specified: Secondary | ICD-10-CM | POA: Diagnosis not present

## 2016-04-20 DIAGNOSIS — I5042 Chronic combined systolic (congestive) and diastolic (congestive) heart failure: Secondary | ICD-10-CM | POA: Diagnosis not present

## 2016-04-20 DIAGNOSIS — E1122 Type 2 diabetes mellitus with diabetic chronic kidney disease: Secondary | ICD-10-CM | POA: Diagnosis not present

## 2016-04-20 DIAGNOSIS — I13 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 1 through stage 4 chronic kidney disease, or unspecified chronic kidney disease: Secondary | ICD-10-CM | POA: Diagnosis not present

## 2016-04-20 MED ORDER — METOPROLOL SUCCINATE ER 25 MG PO TB24: 25.0000 mg | ORAL_TABLET | Freq: Every day | ORAL | 1 refills | Status: DC

## 2016-04-20 NOTE — Telephone Encounter (Signed)
See message and please advise, Thanks!  

## 2016-04-20 NOTE — Telephone Encounter (Signed)
I contacted Clair Gulling and gave approval for physical therapy orders.

## 2016-04-20 NOTE — Telephone Encounter (Signed)
[name] [name] from [facility] stated he see patient for Home health therapy. He need to have additional visits with her 2x a week for 1 week. I x a week  until March 22nd. [phone]   Please advise

## 2016-04-20 NOTE — Telephone Encounter (Signed)
I contacted Jim with [facility]. The visits would be to address physical therapy and the reason would be to reduce fall risk, functional ability and balance strength.

## 2016-04-20 NOTE — Telephone Encounter (Signed)
Is this physical therapy?  I need justification for further therapy

## 2016-04-20 NOTE — Telephone Encounter (Signed)
Approved.  

## 2016-04-22 DIAGNOSIS — N183 Chronic kidney disease, stage 3 (moderate): Secondary | ICD-10-CM | POA: Diagnosis not present

## 2016-04-22 DIAGNOSIS — E1122 Type 2 diabetes mellitus with diabetic chronic kidney disease: Secondary | ICD-10-CM | POA: Diagnosis not present

## 2016-04-22 DIAGNOSIS — I2510 Atherosclerotic heart disease of native coronary artery without angina pectoris: Secondary | ICD-10-CM | POA: Diagnosis not present

## 2016-04-22 DIAGNOSIS — I13 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 1 through stage 4 chronic kidney disease, or unspecified chronic kidney disease: Secondary | ICD-10-CM | POA: Diagnosis not present

## 2016-04-22 DIAGNOSIS — N390 Urinary tract infection, site not specified: Secondary | ICD-10-CM | POA: Diagnosis not present

## 2016-04-22 DIAGNOSIS — I5042 Chronic combined systolic (congestive) and diastolic (congestive) heart failure: Secondary | ICD-10-CM | POA: Diagnosis not present

## 2016-04-27 DIAGNOSIS — N390 Urinary tract infection, site not specified: Secondary | ICD-10-CM | POA: Diagnosis not present

## 2016-04-27 DIAGNOSIS — I13 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 1 through stage 4 chronic kidney disease, or unspecified chronic kidney disease: Secondary | ICD-10-CM | POA: Diagnosis not present

## 2016-04-27 DIAGNOSIS — E1122 Type 2 diabetes mellitus with diabetic chronic kidney disease: Secondary | ICD-10-CM | POA: Diagnosis not present

## 2016-04-27 DIAGNOSIS — N183 Chronic kidney disease, stage 3 (moderate): Secondary | ICD-10-CM | POA: Diagnosis not present

## 2016-04-27 DIAGNOSIS — I5042 Chronic combined systolic (congestive) and diastolic (congestive) heart failure: Secondary | ICD-10-CM | POA: Diagnosis not present

## 2016-04-27 DIAGNOSIS — I2510 Atherosclerotic heart disease of native coronary artery without angina pectoris: Secondary | ICD-10-CM | POA: Diagnosis not present

## 2016-04-29 ENCOUNTER — Other Ambulatory Visit: Payer: Self-pay

## 2016-04-29 MED ORDER — HYDROCODONE-ACETAMINOPHEN 5-325 MG PO TABS
ORAL_TABLET | ORAL | 0 refills | Status: DC
Start: 2016-04-29 — End: 2016-04-30

## 2016-04-29 NOTE — Telephone Encounter (Signed)
Ready for pick up

## 2016-04-29 NOTE — Telephone Encounter (Signed)
need prescription for   HYDROcodone-acetaminophen (NORCO/VICODIN) 5-325 MG tablet

## 2016-04-29 NOTE — Telephone Encounter (Signed)
Waiting for signature

## 2016-04-30 ENCOUNTER — Other Ambulatory Visit: Payer: Self-pay

## 2016-04-30 DIAGNOSIS — I2510 Atherosclerotic heart disease of native coronary artery without angina pectoris: Secondary | ICD-10-CM | POA: Diagnosis not present

## 2016-04-30 DIAGNOSIS — I5042 Chronic combined systolic (congestive) and diastolic (congestive) heart failure: Secondary | ICD-10-CM | POA: Diagnosis not present

## 2016-04-30 DIAGNOSIS — N390 Urinary tract infection, site not specified: Secondary | ICD-10-CM | POA: Diagnosis not present

## 2016-04-30 DIAGNOSIS — E1122 Type 2 diabetes mellitus with diabetic chronic kidney disease: Secondary | ICD-10-CM | POA: Diagnosis not present

## 2016-04-30 DIAGNOSIS — I13 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 1 through stage 4 chronic kidney disease, or unspecified chronic kidney disease: Secondary | ICD-10-CM | POA: Diagnosis not present

## 2016-04-30 DIAGNOSIS — N183 Chronic kidney disease, stage 3 (moderate): Secondary | ICD-10-CM | POA: Diagnosis not present

## 2016-04-30 MED ORDER — HYDROCODONE-ACETAMINOPHEN 5-325 MG PO TABS: ORAL_TABLET | ORAL | 0 refills | Status: DC

## 2016-04-30 NOTE — Telephone Encounter (Signed)
Patient stated that she was given 15 pills, for HYDROcodone-acetaminophen (NORCO/VICODIN) 5-325 MG tablet she is supposed to get 60 please advise.

## 2016-04-30 NOTE — Telephone Encounter (Signed)
Was in the system for 15 not sure why but ordering 45 more will be ready for pick up on 05/03/16

## 2016-05-04 ENCOUNTER — Ambulatory Visit (INDEPENDENT_AMBULATORY_CARE_PROVIDER_SITE_OTHER): Payer: Medicare Other | Admitting: Podiatry

## 2016-05-04 ENCOUNTER — Encounter: Payer: Self-pay | Admitting: Podiatry

## 2016-05-04 DIAGNOSIS — E1151 Type 2 diabetes mellitus with diabetic peripheral angiopathy without gangrene: Secondary | ICD-10-CM

## 2016-05-04 DIAGNOSIS — M79604 Pain in right leg: Secondary | ICD-10-CM

## 2016-05-04 DIAGNOSIS — M79605 Pain in left leg: Secondary | ICD-10-CM

## 2016-05-04 DIAGNOSIS — B351 Tinea unguium: Secondary | ICD-10-CM

## 2016-05-04 NOTE — Progress Notes (Signed)
Complaint:  Visit Type: Patient returns to my office for continued preventative foot care services. Complaint: Patient states" my nails have grown long and thick and become painful to walk and wear shoes" Patient has been diagnosed with DM The patient presents for preventative foot care services. No changes to ROS.  She says the callus under her right big toe joint has finally become pain-free.  Podiatric Exam: Vascular: dorsalis pedis and posterior tibial pulses are diminished   bilateral. Capillary return is immediate. Temperature gradient is WNL. Skin turgor WNL  Sensorium: Diminished Semmes Weinstein monofilament test. Normal tactile sensation bilaterally. Nail Exam: Pt has thick disfigured discolored nails with subungual debris noted bilateral entire nail hallux through fifth toenails Ulcer Exam: There is no evidence of ulcer or pre-ulcerative changes or infection. Orthopedic Exam: Muscle tone and strength are WNL. No limitations in general ROM. No crepitus or effusions noted. Foot type and digits show no abnormalities. Bony prominences are unremarkable. Skin: No Porokeratosis. No infection or ulcers  Diagnosis:  Onychomycosis, , Pain in right toe, pain in left toes  Treatment & Plan Procedures and Treatment: Consent by patient was obtained for treatment procedures. The patient understood the discussion of treatment and procedures well. All questions were answered thoroughly reviewed. Debridement of mycotic and hypertrophic toenails, 1 through 5 bilateral and clearing of subungual debris. No ulceration, no infection noted.  Return Visit-Office Procedure: Patient instructed to return to the office for a follow up visit 3 months for continued evaluation and treatment.    Gardiner Barefoot DPM

## 2016-05-05 DIAGNOSIS — I13 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 1 through stage 4 chronic kidney disease, or unspecified chronic kidney disease: Secondary | ICD-10-CM | POA: Diagnosis not present

## 2016-05-05 DIAGNOSIS — I2510 Atherosclerotic heart disease of native coronary artery without angina pectoris: Secondary | ICD-10-CM | POA: Diagnosis not present

## 2016-05-05 DIAGNOSIS — I5042 Chronic combined systolic (congestive) and diastolic (congestive) heart failure: Secondary | ICD-10-CM | POA: Diagnosis not present

## 2016-05-05 DIAGNOSIS — E1122 Type 2 diabetes mellitus with diabetic chronic kidney disease: Secondary | ICD-10-CM | POA: Diagnosis not present

## 2016-05-05 DIAGNOSIS — N390 Urinary tract infection, site not specified: Secondary | ICD-10-CM | POA: Diagnosis not present

## 2016-05-05 DIAGNOSIS — N183 Chronic kidney disease, stage 3 (moderate): Secondary | ICD-10-CM | POA: Diagnosis not present

## 2016-05-06 ENCOUNTER — Encounter: Payer: Self-pay | Admitting: Internal Medicine

## 2016-05-07 ENCOUNTER — Other Ambulatory Visit: Payer: Self-pay

## 2016-05-07 DIAGNOSIS — H40053 Ocular hypertension, bilateral: Secondary | ICD-10-CM | POA: Diagnosis not present

## 2016-05-07 MED ORDER — GLUCOSE BLOOD VI STRP: ORAL_STRIP | 4 refills | Status: DC

## 2016-05-07 NOTE — Telephone Encounter (Signed)
Per note below hydrocodone has been ready for pick-up since 05/03/16 strips ordered

## 2016-05-07 NOTE — Telephone Encounter (Signed)
Patient is calling about her medication    HYDROcodone-acetaminophen (NORCO/VICODIN) 5-325 MG tablet 45 tablet

## 2016-05-07 NOTE — Telephone Encounter (Signed)
Calling on the status of test strip refill and HYDROcodone  Please advise

## 2016-05-11 DIAGNOSIS — N183 Chronic kidney disease, stage 3 (moderate): Secondary | ICD-10-CM | POA: Diagnosis not present

## 2016-05-11 DIAGNOSIS — N390 Urinary tract infection, site not specified: Secondary | ICD-10-CM | POA: Diagnosis not present

## 2016-05-11 DIAGNOSIS — E1122 Type 2 diabetes mellitus with diabetic chronic kidney disease: Secondary | ICD-10-CM | POA: Diagnosis not present

## 2016-05-11 DIAGNOSIS — I2510 Atherosclerotic heart disease of native coronary artery without angina pectoris: Secondary | ICD-10-CM | POA: Diagnosis not present

## 2016-05-11 DIAGNOSIS — I13 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 1 through stage 4 chronic kidney disease, or unspecified chronic kidney disease: Secondary | ICD-10-CM | POA: Diagnosis not present

## 2016-05-11 DIAGNOSIS — I5042 Chronic combined systolic (congestive) and diastolic (congestive) heart failure: Secondary | ICD-10-CM | POA: Diagnosis not present

## 2016-05-18 DIAGNOSIS — I5042 Chronic combined systolic (congestive) and diastolic (congestive) heart failure: Secondary | ICD-10-CM | POA: Diagnosis not present

## 2016-05-18 DIAGNOSIS — N390 Urinary tract infection, site not specified: Secondary | ICD-10-CM | POA: Diagnosis not present

## 2016-05-18 DIAGNOSIS — N183 Chronic kidney disease, stage 3 (moderate): Secondary | ICD-10-CM | POA: Diagnosis not present

## 2016-05-18 DIAGNOSIS — I2510 Atherosclerotic heart disease of native coronary artery without angina pectoris: Secondary | ICD-10-CM | POA: Diagnosis not present

## 2016-05-18 DIAGNOSIS — E1122 Type 2 diabetes mellitus with diabetic chronic kidney disease: Secondary | ICD-10-CM | POA: Diagnosis not present

## 2016-05-18 DIAGNOSIS — I13 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 1 through stage 4 chronic kidney disease, or unspecified chronic kidney disease: Secondary | ICD-10-CM | POA: Diagnosis not present

## 2016-05-21 ENCOUNTER — Other Ambulatory Visit (INDEPENDENT_AMBULATORY_CARE_PROVIDER_SITE_OTHER): Payer: Medicare Other

## 2016-05-21 ENCOUNTER — Other Ambulatory Visit: Payer: Self-pay

## 2016-05-21 DIAGNOSIS — E038 Other specified hypothyroidism: Secondary | ICD-10-CM | POA: Diagnosis not present

## 2016-05-21 DIAGNOSIS — E782 Mixed hyperlipidemia: Secondary | ICD-10-CM | POA: Diagnosis not present

## 2016-05-21 DIAGNOSIS — E063 Autoimmune thyroiditis: Secondary | ICD-10-CM

## 2016-05-21 DIAGNOSIS — E559 Vitamin D deficiency, unspecified: Secondary | ICD-10-CM | POA: Diagnosis not present

## 2016-05-21 DIAGNOSIS — E119 Type 2 diabetes mellitus without complications: Secondary | ICD-10-CM

## 2016-05-21 LAB — COMPREHENSIVE METABOLIC PANEL
ALBUMIN: 3.5 g/dL (ref 3.5–5.2)
ALK PHOS: 67 U/L (ref 39–117)
ALT: 9 U/L (ref 0–35)
AST: 14 U/L (ref 0–37)
BUN: 33 mg/dL — ABNORMAL HIGH (ref 6–23)
CO2: 26 mEq/L (ref 19–32)
Calcium: 8.8 mg/dL (ref 8.4–10.5)
Chloride: 108 mEq/L (ref 96–112)
Creatinine, Ser: 1.34 mg/dL — ABNORMAL HIGH (ref 0.40–1.20)
GFR: 40.97 mL/min — ABNORMAL LOW (ref >60.00)
Glucose, Bld: 69 mg/dL — ABNORMAL LOW (ref 70–99)
POTASSIUM: 3.8 meq/L (ref 3.5–5.1)
Sodium: 140 mEq/L (ref 135–145)
TOTAL PROTEIN: 6.4 g/dL (ref 6.0–8.3)
Total Bilirubin: 0.4 mg/dL (ref 0.2–1.2)

## 2016-05-21 LAB — LIPID PANEL
CHOL/HDL RATIO: 4
Cholesterol: 129 mg/dL (ref 0–200)
HDL: 35.9 mg/dL — AB (ref >39.00)
LDL CALC: 60 mg/dL (ref 0–99)
NONHDL: 93.15
Triglycerides: 167 mg/dL — ABNORMAL HIGH (ref 0.0–149.0)
VLDL: 33.4 mg/dL (ref 0.0–40.0)

## 2016-05-21 LAB — TSH: TSH: 0.12 u[IU]/mL — ABNORMAL LOW (ref 0.35–4.50)

## 2016-05-21 LAB — HEMOGLOBIN A1C: Hgb A1c MFr Bld: 6.3 % (ref 4.6–6.5)

## 2016-05-21 LAB — VITAMIN D 25 HYDROXY (VIT D DEFICIENCY, FRACTURES): VITD: 56.44 ng/mL (ref 30.00–100.00)

## 2016-05-24 ENCOUNTER — Ambulatory Visit: Payer: Medicare Other | Admitting: Endocrinology

## 2016-05-25 ENCOUNTER — Other Ambulatory Visit: Payer: Self-pay | Admitting: Endocrinology

## 2016-05-25 ENCOUNTER — Telehealth: Payer: Self-pay | Admitting: Endocrinology

## 2016-05-25 ENCOUNTER — Other Ambulatory Visit: Payer: Self-pay

## 2016-05-25 ENCOUNTER — Other Ambulatory Visit: Payer: Medicare Other

## 2016-05-25 MED ORDER — ROSUVASTATIN CALCIUM 20 MG PO TABS: 20.0000 mg | ORAL_TABLET | Freq: Every day | ORAL | 2 refills | Status: DC

## 2016-05-27 DIAGNOSIS — I5042 Chronic combined systolic (congestive) and diastolic (congestive) heart failure: Secondary | ICD-10-CM | POA: Diagnosis not present

## 2016-05-27 DIAGNOSIS — I2510 Atherosclerotic heart disease of native coronary artery without angina pectoris: Secondary | ICD-10-CM | POA: Diagnosis not present

## 2016-05-27 DIAGNOSIS — E1122 Type 2 diabetes mellitus with diabetic chronic kidney disease: Secondary | ICD-10-CM | POA: Diagnosis not present

## 2016-05-27 DIAGNOSIS — N390 Urinary tract infection, site not specified: Secondary | ICD-10-CM | POA: Diagnosis not present

## 2016-05-27 DIAGNOSIS — N183 Chronic kidney disease, stage 3 (moderate): Secondary | ICD-10-CM | POA: Diagnosis not present

## 2016-05-27 DIAGNOSIS — I13 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 1 through stage 4 chronic kidney disease, or unspecified chronic kidney disease: Secondary | ICD-10-CM | POA: Diagnosis not present

## 2016-05-28 ENCOUNTER — Encounter: Payer: Self-pay | Admitting: Endocrinology

## 2016-05-28 ENCOUNTER — Ambulatory Visit (INDEPENDENT_AMBULATORY_CARE_PROVIDER_SITE_OTHER): Payer: Medicare Other | Admitting: Endocrinology

## 2016-05-28 VITALS — BP 104/76 | HR 59 | Ht 66.0 in | Wt 143.0 lb

## 2016-05-28 DIAGNOSIS — E038 Other specified hypothyroidism: Secondary | ICD-10-CM

## 2016-05-28 DIAGNOSIS — N183 Chronic kidney disease, stage 3 unspecified: Secondary | ICD-10-CM

## 2016-05-28 DIAGNOSIS — D649 Anemia, unspecified: Secondary | ICD-10-CM

## 2016-05-28 DIAGNOSIS — Z794 Long term (current) use of insulin: Secondary | ICD-10-CM

## 2016-05-28 DIAGNOSIS — E063 Autoimmune thyroiditis: Secondary | ICD-10-CM

## 2016-05-28 DIAGNOSIS — E782 Mixed hyperlipidemia: Secondary | ICD-10-CM

## 2016-05-28 DIAGNOSIS — E1165 Type 2 diabetes mellitus with hyperglycemia: Secondary | ICD-10-CM

## 2016-05-28 LAB — URINALYSIS, ROUTINE W REFLEX MICROSCOPIC
Bilirubin Urine: NEGATIVE
KETONES UR: NEGATIVE
Nitrite: NEGATIVE
SPECIFIC GRAVITY, URINE: 1.01 (ref 1.000–1.030)
URINE GLUCOSE: NEGATIVE
UROBILINOGEN UA: 0.2 (ref 0.0–1.0)
pH: 5.5 (ref 5.0–8.0)

## 2016-05-28 NOTE — Patient Instructions (Addendum)
Levemir 16 units  THYROID MED: [facility] Saturdays

## 2016-05-28 NOTE — Progress Notes (Signed)
Patient ID: [name] [name], female   DOB: 22-Jan-1941, 76 y.o.   MRN: [id number]   Reason for Appointment: Follow-up of various problems  History of Present Illness    RENAL dysfunction:  Renal function is variable, recently appears to be better Does not have diabetic nephropathy May have had worsening of renal function in January when she was hospitalized partly because of UTI   No recurrence of hyperkalemia   Lab Results  Component Value Date   CREATININE 1.34 (H) 05/21/2016   CREATININE 1.64 (H) 03/24/2016   CREATININE 2.03 (H) 03/23/2016   CREATININE 1.68 (H) 03/19/2016   Lab Results  Component Value Date   CREATININE 1.34 (H) 05/21/2016   BUN 33 (H) 05/21/2016   NA 140 05/21/2016   K 3.8 05/21/2016   CL 108 05/21/2016   CO2 26 05/21/2016      CHF: She was admitted with shortness of breath and orthopnea on 01/23/16 She has a low ejection fraction of 20-25 % and is  on hydralazine 2 times a day along with 20 mg Lasix every second day Also she had atrial flutter which has resolved   She does not feel out of breath during the day and can lie down comfortably She does have a cardiologist   Type 2 DIABETES mellitus, date of diagnosis: 1986.   The insulin regimen is: Levemir 18 units hs, Novolog 8 ac twice a day   Type 2 diabetes  has been treated in the last few years with low dose basal bolus insulin regimen. She has not been taking  any oral hypoglycemic drugs including metformin partly because of GI side effects  A1c is fairly good at 0.3, previously 6.0 done in November  Blood sugars from review of monitor showed that her fasting readings are trending to be lower at times, her lab glucose was 69 Has slightly variable readings in the afternoons and evenings before and after supper, has only a couple of readings over 150, mostly in the evening For various reasons she does not exercise Weight is about the same  Side effects from medications:  Diarrhea from metformin and nausea and vomiting from GLP-1 drugs   Monitors blood glucose: Less than 1 times a day  Glucometer: One Touch.  Readings  Mean values apply above for all meters except median for One Touch  PRE-MEAL Fasting Lunch Dinner Bedtime Overall  Glucose range: 69-144  88-153   75-177    Mean/median: 106     113     Meals: she is usually eating low fat meals especially recently meals usually at 11 AM,and supper at 5 pm.  Calorie intake: Usually controlled. Moderate Carbs Physical activity: exercise: Minimal  Dietician visit: Most recent:, 5/13.   Wt Readings from Last 3 Encounters:  05/28/16 143 lb (64.9 kg)  03/25/16 141 lb 6.4 oz (64.1 kg)  03/22/16 139 lb (63 kg)    Lab Results  Component Value Date   HGBA1C 6.3 05/21/2016   HGBA1C 6.0 (H) 01/23/2016   HGBA1C 5.4 11/27/2015   Lab Results  Component Value Date   MICROALBUR 24.7 (H) 02/02/2016   LDLCALC 60 05/21/2016   CREATININE 1.34 (H) 05/21/2016    ANEMIA:  Taking iron supplements   Lab Results  Component Value Date   WBC 7.8 03/24/2016   HGB 9.5 (L) 03/24/2016   HCT 29.7 (L) 03/24/2016   MCV 89.5 03/24/2016   PLT 184 03/24/2016      HYPOTHYROIDISM: She has had  long-standing primary hypothyroidism She is taking Levothyroxine, 125 g dose, does take it regularly in the morning No complaints of unusual fatigue Even though her dose has not been changed her TSH is significantly low now   Lab Results  Component Value Date   TSH 0.12 (L) 05/21/2016   TSH 0.235 (L) 03/24/2016   TSH 2.04 02/02/2016   FREET4 1.26 02/02/2016   FREET4 0.91 11/27/2015   FREET4 1.09 06/30/2015    OTHER active problems: See review of systems    Allergies as of 05/28/2016   No Known Allergies     Medication List       Accurate as of 05/28/16  1:21 PM. Always use your most recent med list.          aspirin 81 MG chewable tablet Chew 81 mg by mouth daily.   calcium-vitamin D 500-200 MG-UNIT  tablet Commonly known as:  OSCAL WITH D Take 1 tablet by mouth every morning.   Cranberry 250 MG Tabs Take 250 mg by mouth daily.   ferrous sulfate 325 (65 FE) MG tablet Take 325 mg by mouth every evening.   furosemide 20 MG tablet Commonly known as:  LASIX Take 20 mg by mouth every other day.   glucose blood test strip Commonly known as:  ONE TOUCH ULTRA TEST Use to test blood sugar 1-2 times daily Dx code- E11.9   hydrALAZINE 10 MG tablet Commonly known as:  APRESOLINE Take 1 tablet (10 mg total) by mouth every 8 (eight) hours.   HYDROcodone-acetaminophen 5-325 MG tablet Commonly known as:  NORCO/VICODIN Take 1 tablet every 12 hours as needed for back pain   insulin aspart 100 UNIT/ML FlexPen Commonly known as:  NOVOLOG Inject 8 units three times a day with meals   isosorbide mononitrate 30 MG 24 hr tablet Commonly known as:  IMDUR Take 1 tablet (30 mg total) by mouth daily.   latanoprost 0.005 % ophthalmic solution Commonly known as:  XALATAN Place 1 drop into both eyes at bedtime.   LEVEMIR FLEXTOUCH 100 UNIT/ML Pen Generic drug:  Insulin Detemir INJECT 22 UNITS AT BEDTIME   levothyroxine 125 MCG tablet Commonly known as:  SYNTHROID, LEVOTHROID Take 125 mcg by mouth daily before breakfast.   levothyroxine 125 MCG tablet Commonly known as:  SYNTHROID, LEVOTHROID TAKE 1 TABLET DAILY   metoprolol succinate 25 MG 24 hr tablet Commonly known as:  TOPROL-XL Take 1 tablet (25 mg total) by mouth daily.   omeprazole 20 MG capsule Commonly known as:  PRILOSEC TAKE 1 CAPSULE DAILY   Pen Needles 31G X 6 MM Misc Use to inject insulin 4 times per day.   polyethylene glycol packet Commonly known as:  MIRALAX / GLYCOLAX Take 17 g by mouth daily as needed for moderate constipation.   rosuvastatin 20 MG tablet Commonly known as:  CRESTOR Take 1 tablet (20 mg total) by mouth daily. Take 1 tablet daily   rosuvastatin 20 MG tablet Commonly known as:  CRESTOR TAKE  1 TABLET DAILY   sulfamethoxazole-trimethoprim 800-160 MG tablet Commonly known as:  BACTRIM DS,SEPTRA DS Take 1 tablet by mouth 2 (two) times daily.   VENTOLIN HFA 108 (90 Base) MCG/ACT inhaler Generic drug:  albuterol inhale 2 puffs by mouth INTO THE LUNGS every 6 hours if needed for wheezing   VITAMIN B 12 PO Take 1 tablet by mouth daily.   vitamin C 500 MG tablet Commonly known as:  ASCORBIC ACID Take 500 mg by mouth at bedtime.  Vitamin D (Ergocalciferol) 50000 units Caps capsule Commonly known as:  DRISDOL TAKE 1 CAPSULE BY MOUTH EVERY 14 DAYS       Allergies: No Known Allergies  Past Medical History:  Diagnosis Date  . AAA (abdominal aortic aneurysm) ([place])   . Bilateral hydronephrosis   . Carotid artery stenosis    carotid doppler 06/2012 - Right CCA/Bulb/ICA with chronic occlusion; L vertebral artery with abnormal blood flow; L Bulb/Prox ICA  s/p endarterectomy with mild fibrous plaque, 50% diameter reduction  . CHF (congestive heart failure) ([place])   . Choledocholithiasis 2017  . CKD (chronic kidney disease), stage III   . Coronary artery disease due to lipid rich plaque cardiologist-  dr berry   s/p CABG x6 1997-- cath 12-09-2009 occluded vein to obtuse marginal branch and ramus branch with patent vien to PDA and patent LIMA to LAD, ef 40%-- Myoview 11-24-2011, nonischemic  . Diverticulosis   . Dyspnea on exertion   . GERD (gastroesophageal reflux disease)   . GI bleed   . History of sepsis    10-18-2014 w/ acute pyelonephritis  . Hyperlipidemia   . Hypertension   . Hypothyroidism   . Ischemic cardiomyopathy    03-25-2010-- per lasts echo EF  50-55%  . PAD (peripheral artery disease) ([place])    09/2010 LEAs - R ABI of 0.45, occluded fem-pop bypass graft, L ABI of 0.59 with occluded SFA; severe arterial insuff  . PVD (peripheral vascular disease) with claudication ([place])    last duplex 07-04-2015 -- Right CCA and ICA chronic occlusion, 06-23% LICA, Patent  vertebral arteries w/ antegrade flow, bilateral normal subclavian arteries   . Retroperitoneal fibrosis   . Tubular adenoma of colon   . Type 2 diabetes mellitus ([place])    monitored by dr Dwyane Dee    Past Surgical History:  Procedure Laterality Date  . AORTA - BILATERAL FEMORAL ARTERY BYPASS GRAFT  1997   and RIGHT FEM-POP   . CARDIAC CATHETERIZATION  12-09-2009  dr al little   EF >40%-- occluded vein to OM & ramus branches, patent vein to PDA, patent LIMA to LAD (Dr. [name], [facility]) - later had thrombectomy of R fem-pop bypass ad R common femoral & profunda femoris artery (Dr. Oneida Alar)  . CARDIOVASCULAR STRESS TEST  11-24-2011   dr berry   Low Risk study: fixed basal to mid inferior attenuation artifact, no reversible ischemia,  normal LV function and wall motion , ef 67%  . CAROTID ENDARTERECTOMY Bilateral right 1994//  left ?  Marland Kitchen CATARACT EXTRACTION W/ INTRAOCULAR LENS  IMPLANT, BILATERAL  2006  . CHOLECYSTECTOMY N/A 09/05/2015   Procedure: ATTEMPTED LAPAROSCOPIC CHOLECYSTECTOMY, EXPLORATORY LAPAROTOMY WITH CHOLECYSTECTOMY;  Surgeon: Autumn Messing III, MD;  Location: [place];  Service: General;  Laterality: N/A;  . COLONOSCOPY    . COLONOSCOPY  10/21/2015  . COLONOSCOPY WITH PROPOFOL N/A 10/21/2015   Procedure: COLONOSCOPY WITH PROPOFOL;  Surgeon: Milus Banister, MD;  Location: [place];  Service: Endoscopy;  Laterality: N/A;  . CORONARY ARTERY BYPASS GRAFT  1997   x6; internal mammary to LAD, SVG to ramus #1 & #2, SVG to OM, SVG to PDA,   . CYSTOSCOPY W/ RETROGRADES Right 08/01/2015   Procedure: CYSTOSCOPY WITH RETROGRADE PYELOGRAM;  Surgeon: Ardis Hughs, MD;  Location: [facility];  Service: Urology;  Laterality: Right;  . CYSTOSCOPY W/ URETERAL STENT PLACEMENT  03/10/2012   Procedure: CYSTOSCOPY WITH RETROGRADE PYELOGRAM/URETERAL STENT PLACEMENT;  Surgeon: Hanley Ben, MD;  Location: [facility];  Service: Urology;  Laterality: Left;  . CYSTOSCOPY W/ URETERAL STENT PLACEMENT  Bilateral 03/07/2015   Procedure: BILATERAL RETROGRADE PYELOGRAM AND RIGHT URETERAL STENT PLACEMENT;  Surgeon: Ardis Hughs, MD;  Location: [facility];  Service: Urology;  Laterality: Bilateral;  . CYSTOSCOPY W/ URETERAL STENT PLACEMENT Right 08/01/2015   Procedure: CYSTOSCOPY WITH STENT REPLACEMENT;  Surgeon: Ardis Hughs, MD;  Location: [facility];  Service: Urology;  Laterality: Right;  . CYSTOSCOPY W/ URETERAL STENT PLACEMENT Right 02/13/2016   Procedure: RIGHT URETERAL STENT EXCHANGE;  Surgeon: Ardis Hughs, MD;  Location: [facility];  Service: Urology;  Laterality: Right;  . ERCP N/A 09/03/2015   Procedure: ENDOSCOPIC RETROGRADE CHOLANGIOPANCREATOGRAPHY (ERCP);  Surgeon: Irene Shipper, MD;  Location: [facility] ENDOSCOPY;  Service: Endoscopy;  Laterality: N/A;  . ESOPHAGOGASTRODUODENOSCOPY    . ESOPHAGOGASTRODUODENOSCOPY N/A 10/19/2015   Procedure: ESOPHAGOGASTRODUODENOSCOPY (EGD);  Surgeon: Gatha Mayer, MD;  Location: [facility];  Service: Endoscopy;  Laterality: N/A;  . GIVENS CAPSULE STUDY  10/21/2015  . GIVENS CAPSULE STUDY N/A 10/21/2015   Procedure: GIVENS CAPSULE STUDY;  Surgeon: Milus Banister, MD;  Location: [place];  Service: Endoscopy;  Laterality: N/A;  . REPAIR RIGHT FEMORAL PSEUDOANEUYSM/  RIGHT FEM-POP BYPASS GRAFT/  DEBRIDEMENT RIGHT LOWER EXTREMITIY VENOUS STATUS ULCERS X2  01-12-2005  . TOTAL ABDOMINAL HYSTERECTOMY W/ BILATERAL SALPINGOOPHORECTOMY  1986  . TRANSTHORACIC ECHOCARDIOGRAM  03/25/2010   EF 36-14%, LV systolic function low normal with mild inferoseptal hypocontractility; LA mildly dilated; mod MR; mild TR, RV systolic pressure elevated, mild pulm HTN; AV mildly sclerotic; mild pulm valve regurg; aortic root sclerosis/calcif     Family History  Problem Relation Age of Onset  . Congestive Heart Failure Mother   . Diabetes Mother   . Stroke Father   . Cancer Maternal Aunt     Breast cancer    Social History:   reports that she quit smoking about 6 years ago. She has a 60.00 pack-year smoking history. She has never used smokeless tobacco. She reports that she drinks about 1.8 oz of alcohol per week . She reports that she does not use drugs.  Review of Systems -     Hyperlipidemia: Has history of high LDL and triglycerides/low HDL  Was changed from Lipitor to Crestor because of higher LDL and she was not wanting to pay the higher cost of adding Zetia   LDL is Controlled  Tends to have high triglycerides , Now appearing better LDL is also reduced   Lab Results  Component Value Date   CHOL 129 05/21/2016   HDL 35.90 (L) 05/21/2016   LDLCALC 60 05/21/2016   LDLDIRECT 69.0 02/02/2016   TRIG 167.0 (H) 05/21/2016   CHOLHDL 4 05/21/2016     Vitamin D deficiency: She has been on supplements and taking her 50,000 units every other week  Her recent level is normal  Still has on and off low back pain which is chronic    History of UTI: Patient requests urinalysis, currently symptomatic.  Previously treated with Bactrim in the hospital but did not have a positive ulcer  Examination:   BP 104/76   Pulse (!) 59   Ht 5\' 6"  (1.676 m)   Wt 143 lb (64.9 kg)   SpO2 93%   BMI 23.08 kg/m   Body mass index is 23.08 kg/m.      No  edema present    Assesment/PLAN:   CHF: This may have been precipitated by her  atrial fibrillation She is on Lasix, hydralazine and metoprolol Clinically doing well, taking Lasix every other day with control of shortness of breath  RENAL insufficiency: Likely to be from vascular insufficiency and glomerular sclerosis No history of diabetic nephropathy Her creatinine is improved now and does tend to fluctuate Hydralazine was reduced by 1 tablet on the last visit Blood pressure tends to be low normal  ANEMIA: Will need follow-up on the next visit  HYPERLIPIDEMIA: She has better results with increasing Crestor back to 20 mg Triglycerides are also  better  DIABETES: Blood sugars have  been mostly well-controlled, including after meals She does appear to have low normal readings fasting occasionally including in the lab She has good postprandial control and A1c is excellent at 6.3 She will cut down her Levemir by 2 units for now  HYPOTHYROIDISM: Her TSH is now low which is unusual 125 g Since she has a large supply at home she will take the same dose but skip 1 day a week  Urinalysis to be done today  Patient Instructions  Levemir 16 units  THYROID MED: SKIP Saturdays  Total visit time for reviewing office and hospital records, labs, evaluation and management, counseling = 25 minutes   [facility]  05/28/16

## 2016-05-29 NOTE — Progress Notes (Signed)
Please let patient know that the urinalysis showed no significant infection

## 2016-06-17 ENCOUNTER — Other Ambulatory Visit: Payer: Self-pay | Admitting: Endocrinology

## 2016-06-29 ENCOUNTER — Encounter: Payer: Self-pay | Admitting: Internal Medicine

## 2016-06-29 ENCOUNTER — Other Ambulatory Visit (INDEPENDENT_AMBULATORY_CARE_PROVIDER_SITE_OTHER): Payer: Medicare Other

## 2016-06-29 ENCOUNTER — Ambulatory Visit (INDEPENDENT_AMBULATORY_CARE_PROVIDER_SITE_OTHER): Payer: Medicare Other | Admitting: Internal Medicine

## 2016-06-29 VITALS — BP 140/60 | HR 63 | Ht 66.0 in | Wt 147.6 lb

## 2016-06-29 DIAGNOSIS — K5520 Angiodysplasia of colon without hemorrhage: Secondary | ICD-10-CM

## 2016-06-29 DIAGNOSIS — R195 Other fecal abnormalities: Secondary | ICD-10-CM

## 2016-06-29 DIAGNOSIS — D509 Iron deficiency anemia, unspecified: Secondary | ICD-10-CM

## 2016-06-29 LAB — CBC WITH DIFFERENTIAL/PLATELET
BASOS ABS: 0.1 10*3/uL (ref 0.0–0.1)
Basophils Relative: 0.6 % (ref 0.0–3.0)
EOS ABS: 0.2 10*3/uL (ref 0.0–0.7)
Eosinophils Relative: 2.5 % (ref 0.0–5.0)
HEMATOCRIT: 35.9 % — AB (ref 36.0–46.0)
Hemoglobin: 11.6 g/dL — ABNORMAL LOW (ref 12.0–15.0)
LYMPHS PCT: 16.2 % (ref 12.0–46.0)
Lymphs Abs: 1.5 10*3/uL (ref 0.7–4.0)
MCHC: 32.2 g/dL (ref 30.0–36.0)
MCV: 89.6 fl (ref 78.0–100.0)
MONO ABS: 0.7 10*3/uL (ref 0.1–1.0)
Monocytes Relative: 7.1 % (ref 3.0–12.0)
NEUTROS ABS: 6.8 10*3/uL (ref 1.4–7.7)
Neutrophils Relative %: 73.6 % (ref 43.0–77.0)
Platelets: 224 10*3/uL (ref 150.0–400.0)
RBC: 4.01 Mil/uL (ref 3.87–5.11)
RDW: 14.6 % (ref 11.5–15.5)
WBC: 9.2 10*3/uL (ref 4.0–10.5)

## 2016-06-29 LAB — FERRITIN: Ferritin: 149 ng/mL (ref 10.0–291.0)

## 2016-06-29 LAB — IBC PANEL
IRON: 38 ug/dL — AB (ref 42–145)
SATURATION RATIOS: 14.3 % — AB (ref 20.0–50.0)
TRANSFERRIN: 190 mg/dL — AB (ref 212.0–360.0)

## 2016-06-29 NOTE — Patient Instructions (Addendum)
Your physician has requested that you go to the basement for the following lab work before leaving today: CBC, IBC, Ferritin  Continue oral iron.   Continue omeprazole 20 mg daily.  You may use imodium as needed for your diarrhea.  If your diarrhea increases from once daily, please call us for possible stool testing.  If you are age 75 or older, your body mass index should be between 23-30. Your Body mass index is 23.82 kg/m. If this is out of the aforementioned range listed, please consider follow up with your Primary Care Provider.  If you are age 51 or younger, your body mass index should be between 19-25. Your Body mass index is 23.82 kg/m. If this is out of the aformentioned range listed, please consider follow up with your Primary Care Provider.

## 2016-06-29 NOTE — Progress Notes (Signed)
Subjective:    Patient ID: [name] [name], female    DOB: 06-16-1940, 76 y.o.   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 She also has a history of CHF, AAA, peripheral vascular disease, CAD, history of ureteral stenosis, diabetes and is maintained on chronic aspirin. She is taking iron supplementation once a day along with omeprazole 20 mg daily.  Since being seen here in September she has been hospitalized on 2 occasions, once for exacerbation of heart failure.  She reports most recently she's been feeling fairly well. No recent falls. She denies abdominal pain. Appetite has been good. She denies dysphagia and odynophagia. She continues oral iron and thus stools are dark but she denies melena and rectal bleeding. She has noticed over the past several months she will have approximately one day of loose stools or diarrhea where she can go 3-4 times in one day. The other days the week seem to be normal. She can go as long as 10 days without diarrhea. When she has diarrhea the stools are urgent. But not associated with abdominal pain. She has remained off of her Plavix. She has not needed MiraLAX.  Review of Systems As per history of present illness, otherwise negative  Current Medications, Allergies, Past Medical History, Past Surgical History, Family History and Social History were reviewed in [employer] record.     Objective:   Physical Exam BP 140/60   Pulse 63   Ht 5\' 6"  (1.676 m)   Wt 147 lb 9.6 oz (67 kg)   SpO2 93%   BMI 23.82 kg/m  Constitutional: Well-developed and well-nourished. No distress. HEENT: Normocephalic and atraumatic.  No scleral icterus. Neck: Neck supple. Trachea midline. Cardiovascular: Normal rate,  regular rhythm and intact distal pulses. 2/6 sem Pulmonary/chest: Effort normal and breath sounds normal. No wheezing, rales or rhonchi. Abdominal: Soft, nontender, nondistended. Bowel sounds active throughout.   Extremities: no clubbing, cyanosis, or edema Neurological: Alert and oriented to person place and time. Skin: Skin is warm and dry. Psychiatric: Normal mood and affect. Behavior is normal.  CBC    Component Value Date/Time   WBC 9.2 06/29/2016 1404   RBC 4.01 06/29/2016 1404   HGB 11.6 (L) 06/29/2016 1404   HCT 35.9 (L) 06/29/2016 1404   PLT 224.0 06/29/2016 1404   MCV 89.6 06/29/2016 1404   MCH 28.6 03/24/2016 0756   MCHC 32.2 06/29/2016 1404   RDW 14.6 06/29/2016 1404   LYMPHSABS 1.5 06/29/2016 1404   MONOABS 0.7 06/29/2016 1404   EOSABS 0.2 06/29/2016 1404   BASOSABS 0.1 06/29/2016 1404   CMP     Component Value Date/Time   NA 140 05/21/2016 0849   K 3.8 05/21/2016 0849   CL 108 05/21/2016 0849   CO2 26 05/21/2016 0849   GLUCOSE 69 (L) 05/21/2016 0849   BUN 33 (H) 05/21/2016 0849   CREATININE 1.34 (H) 05/21/2016 0849   CREATININE 1.38 (H) 03/10/2016 1218   CALCIUM 8.8 05/21/2016 0849   PROT 6.4 05/21/2016 0849   ALBUMIN 3.5 05/21/2016 0849   AST 14 05/21/2016 0849   ALT 9 05/21/2016 0849   ALKPHOS 67 05/21/2016 0849   BILITOT 0.4 05/21/2016 0849   GFRNONAA 30 (L) 03/24/2016 0756   GFRAA 34 (L) 03/24/2016 0756  Iron/TIBC/Ferritin/ %Sat    Component Value Date/Time   IRON 38 (L) 06/29/2016 1404   TIBC 199 (L) 11/15/2009 0520   FERRITIN 149.0 06/29/2016 1404   IRONPCTSAT 14.3 (L) 06/29/2016 1404       Assessment & Plan:  76 year old female with a complex medical history including 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 Hemoglobin is 11.6. Iron studies show a mixed picture with normal ferritin though percent sat and iron is low. This is multifactorial with anemia of chronic disease. Ongoing to bump up her oral iron to see if this further helps her iron saturation and blood counts. Increase ferrous sulfate 325 mg twice a day. Repeat CBC and iron studies in 3 months. Continue omeprazole given her history.  2.  Diarrhea/loose stools -- very intermittent happening one day a week or less. Not consistent with an infectious etiology. She has used Imodium with success. She can continue over-the-counter Imodium per box instruction for this symptom. She is asked to notify me if it becomes more frequent or should she develop abdominal pain or any evidence of bleeding. She voices understanding.  25 minutes spent with the patient today. Greater than 50% was spent in counseling and coordination of care with the patient

## 2016-06-30 ENCOUNTER — Other Ambulatory Visit: Payer: Self-pay

## 2016-07-01 ENCOUNTER — Other Ambulatory Visit: Payer: Self-pay

## 2016-07-01 DIAGNOSIS — D509 Iron deficiency anemia, unspecified: Secondary | ICD-10-CM

## 2016-07-02 ENCOUNTER — Ambulatory Visit (HOSPITAL_COMMUNITY)
Admission: RE | Admit: 2016-07-02 | Discharge: 2016-07-02 | Disposition: A | Discharge status: Home / Self Care | Payer: Medicare Other | Source: Ambulatory Visit | Attending: Cardiovascular Disease | Admitting: Cardiovascular Disease

## 2016-07-02 DIAGNOSIS — I779 Disorder of arteries and arterioles, unspecified: Secondary | ICD-10-CM | POA: Diagnosis not present

## 2016-07-02 DIAGNOSIS — E785 Hyperlipidemia, unspecified: Secondary | ICD-10-CM | POA: Insufficient documentation

## 2016-07-02 DIAGNOSIS — E119 Type 2 diabetes mellitus without complications: Secondary | ICD-10-CM | POA: Insufficient documentation

## 2016-07-02 DIAGNOSIS — I2510 Atherosclerotic heart disease of native coronary artery without angina pectoris: Secondary | ICD-10-CM | POA: Diagnosis not present

## 2016-07-02 DIAGNOSIS — Z87891 Personal history of nicotine dependence: Secondary | ICD-10-CM | POA: Insufficient documentation

## 2016-07-02 DIAGNOSIS — I10 Essential (primary) hypertension: Secondary | ICD-10-CM | POA: Diagnosis not present

## 2016-07-02 DIAGNOSIS — I6523 Occlusion and stenosis of bilateral carotid arteries: Secondary | ICD-10-CM | POA: Diagnosis not present

## 2016-07-02 DIAGNOSIS — I739 Peripheral vascular disease, unspecified: Secondary | ICD-10-CM | POA: Insufficient documentation

## 2016-07-06 ENCOUNTER — Other Ambulatory Visit: Payer: Self-pay | Admitting: Cardiovascular Disease

## 2016-07-06 ENCOUNTER — Telehealth: Payer: Self-pay | Admitting: Cardiovascular Disease

## 2016-07-06 DIAGNOSIS — I779 Disorder of arteries and arterioles, unspecified: Secondary | ICD-10-CM

## 2016-07-06 DIAGNOSIS — I739 Peripheral vascular disease, unspecified: Principal | ICD-10-CM

## 2016-07-06 NOTE — Telephone Encounter (Signed)
VAS US CAROTID  Order: [id number]  Status:  Final result Visible to patient:  No (Not Released) Dx:  Bilateral carotid artery disease ([place])  Notes recorded by Therisa Doyne on 07/06/2016 at 9:28 AM EDT Left detail message with results, ok per DPR, and to call back if any questions.  Repeat order entered.  ------  Notes recorded by [name], MD on 07/05/2016 at 8:57 AM EDT No change from prior study. Repeat in 12 months.

## 2016-07-23 ENCOUNTER — Other Ambulatory Visit: Payer: Self-pay

## 2016-07-23 MED ORDER — HYDROCODONE-ACETAMINOPHEN 5-325 MG PO TABS: ORAL_TABLET | ORAL | 0 refills | Status: DC

## 2016-07-26 ENCOUNTER — Other Ambulatory Visit: Payer: Self-pay

## 2016-07-26 ENCOUNTER — Other Ambulatory Visit (INDEPENDENT_AMBULATORY_CARE_PROVIDER_SITE_OTHER): Payer: Medicare Other

## 2016-07-26 DIAGNOSIS — N183 Chronic kidney disease, stage 3 unspecified: Secondary | ICD-10-CM

## 2016-07-26 DIAGNOSIS — E063 Autoimmune thyroiditis: Secondary | ICD-10-CM | POA: Diagnosis not present

## 2016-07-26 DIAGNOSIS — D649 Anemia, unspecified: Secondary | ICD-10-CM

## 2016-07-26 LAB — BASIC METABOLIC PANEL
BUN: 27 mg/dL — AB (ref 6–23)
CO2: 27 mEq/L (ref 19–32)
Calcium: 9.4 mg/dL (ref 8.4–10.5)
Chloride: 106 mEq/L (ref 96–112)
Creatinine, Ser: 1.47 mg/dL — ABNORMAL HIGH (ref 0.40–1.20)
GFR: 36.8 mL/min — AB (ref >60.00)
GLUCOSE: 92 mg/dL (ref 70–99)
Potassium: 3.5 mEq/L (ref 3.5–5.1)
Sodium: 142 mEq/L (ref 135–145)

## 2016-07-26 LAB — TSH: TSH: 0.39 u[IU]/mL (ref 0.35–4.50)

## 2016-07-26 LAB — T4, FREE: Free T4: 1.31 ng/dL (ref 0.60–1.60)

## 2016-07-26 MED ORDER — HYDROCODONE-ACETAMINOPHEN 5-325 MG PO TABS: ORAL_TABLET | ORAL | 0 refills | Status: DC

## 2016-07-29 ENCOUNTER — Ambulatory Visit (INDEPENDENT_AMBULATORY_CARE_PROVIDER_SITE_OTHER): Payer: Medicare Other | Admitting: Endocrinology

## 2016-07-29 ENCOUNTER — Encounter: Payer: Self-pay | Admitting: Endocrinology

## 2016-07-29 ENCOUNTER — Other Ambulatory Visit: Payer: Self-pay | Admitting: Cardiology

## 2016-07-29 VITALS — BP 128/68 | HR 71 | Ht 66.0 in | Wt 145.0 lb

## 2016-07-29 DIAGNOSIS — D649 Anemia, unspecified: Secondary | ICD-10-CM

## 2016-07-29 DIAGNOSIS — E119 Type 2 diabetes mellitus without complications: Secondary | ICD-10-CM

## 2016-07-29 DIAGNOSIS — E063 Autoimmune thyroiditis: Secondary | ICD-10-CM | POA: Diagnosis not present

## 2016-07-29 DIAGNOSIS — N183 Chronic kidney disease, stage 3 unspecified: Secondary | ICD-10-CM

## 2016-07-29 LAB — URINALYSIS, ROUTINE W REFLEX MICROSCOPIC
BILIRUBIN URINE: NEGATIVE
Ketones, ur: NEGATIVE
Nitrite: NEGATIVE
Specific Gravity, Urine: 1.02 (ref 1.000–1.030)
TOTAL PROTEIN, URINE-UPE24: 100 — AB
UROBILINOGEN UA: 0.2 (ref 0.0–1.0)
Urine Glucose: NEGATIVE
pH: 5.5 (ref 5.0–8.0)

## 2016-07-29 LAB — CBC WITH DIFFERENTIAL/PLATELET
BASOS ABS: 0 10*3/uL (ref 0.0–0.1)
Basophils Relative: 0.6 % (ref 0.0–3.0)
EOS PCT: 2.7 % (ref 0.0–5.0)
Eosinophils Absolute: 0.2 10*3/uL (ref 0.0–0.7)
HEMATOCRIT: 35 % — AB (ref 36.0–46.0)
HEMOGLOBIN: 11.4 g/dL — AB (ref 12.0–15.0)
LYMPHS PCT: 14.9 % (ref 12.0–46.0)
Lymphs Abs: 1.1 10*3/uL (ref 0.7–4.0)
MCHC: 32.7 g/dL (ref 30.0–36.0)
MCV: 88.9 fl (ref 78.0–100.0)
MONOS PCT: 6.6 % (ref 3.0–12.0)
Monocytes Absolute: 0.5 10*3/uL (ref 0.1–1.0)
Neutro Abs: 5.7 10*3/uL (ref 1.4–7.7)
Neutrophils Relative %: 75.2 % (ref 43.0–77.0)
Platelets: 217 10*3/uL (ref 150.0–400.0)
RBC: 3.94 Mil/uL (ref 3.87–5.11)
RDW: 14.6 % (ref 11.5–15.5)
WBC: 7.6 10*3/uL (ref 4.0–10.5)

## 2016-07-29 NOTE — Progress Notes (Signed)
Patient ID: [name] [name], female   DOB: 11-05-1940, 76 y.o.   MRN: [id number]   Reason for Appointment: Follow-up of various problems  History of Present Illness    RENAL dysfunction:  Renal function is variable, recently appears to be slightly worse but not compared to baseline previously  Does not have diabetic nephropathy    Lab Results  Component Value Date   CREATININE 1.47 (H) 07/26/2016   CREATININE 1.34 (H) 05/21/2016   CREATININE 1.64 (H) 03/24/2016   CREATININE 2.03 (H) 03/23/2016   Lab Results  Component Value Date   CREATININE 1.47 (H) 07/26/2016   BUN 27 (H) 07/26/2016   NA 142 07/26/2016   K 3.5 07/26/2016   CL 106 07/26/2016   CO2 27 07/26/2016      CHF: She was admitted with shortness of breath and orthopnea on 01/23/16 She has a low ejection fraction of 20-25 % and is  on hydralazine 2 times a day along with 20 mg Lasix every second day Also she had atrial flutter which has resolved   She does not feel out of breath  She does have Follow-up with a cardiologist   Type 2 DIABETES mellitus, date of diagnosis: 1986.   The insulin regimen is: Levemir 16 units hs, Novolog 8 ac twice a day   Type 2 diabetes  has been treated in the last few years with low dose basal bolus insulin regimen. She has not been taking  any oral hypoglycemic drugs including metformin partly because of GI side effects  A1c in March was 6.3  Current management and problems:  Even with reducing her Levemir by 2 units on the last visit her fasting readings are averaging only about 108 and has had a reading of 60 also  Her weight and appetite are about the same  She does check readings after supper and these are variable but last night was only 68  She is not able to exercise because of back pain and fear of falling when walking  Side effects from medications: Diarrhea from metformin and nausea and vomiting from GLP-1 drugs   Monitors blood glucose: Less than  1 times a day  Glucometer: One Touch.  Readings as follows: FASTING range 60-133 Evening range 68-1 94 with evening median about 114   Meals: she is usually eating low fat meals especially recently meals usually at 11 AM,and supper at 5 pm.  Calorie intake: Usually controlled. Moderate Carbs Physical activity: exercise: Minimal  Dietician visit: Most recent:, 5/13.   Wt Readings from Last 3 Encounters:  07/29/16 145 lb (65.8 kg)  06/29/16 147 lb 9.6 oz (67 kg)  05/28/16 143 lb (64.9 kg)    Lab Results  Component Value Date   HGBA1C 6.3 05/21/2016   HGBA1C 6.0 (H) 01/23/2016   HGBA1C 5.4 11/27/2015   Lab Results  Component Value Date   MICROALBUR 24.7 (H) 02/02/2016   LDLCALC 60 05/21/2016   CREATININE 1.47 (H) 07/26/2016    ANEMIA:  Taking iron supplements, was told by gastroenterologist to take 2 pills and she takes these at bedtime   Lab Results  Component Value Date   WBC 9.2 06/29/2016   HGB 11.6 (L) 06/29/2016   HCT 35.9 (L) 06/29/2016   MCV 89.6 06/29/2016   PLT 224.0 06/29/2016      HYPOTHYROIDISM: She has had long-standing primary hypothyroidism She is taking Levothyroxine, 125 g dose, does take it regularly in the morningAnd the dose was reduced to  6 days a week on the last visit No complaints of palpitations or shakiness No unusual fatigue TSH is still low normal  Lab Results  Component Value Date   TSH 0.39 07/26/2016   TSH 0.12 (L) 05/21/2016   TSH 0.235 (L) 03/24/2016   FREET4 1.31 07/26/2016   FREET4 1.26 02/02/2016   FREET4 0.91 11/27/2015    OTHER active problems: See review of systems    Allergies as of 07/29/2016   No Known Allergies     Medication List       Accurate as of 07/29/16  1:15 PM. Always use your most recent med list.          aspirin 81 MG chewable tablet Chew 81 mg by mouth daily.   calcium-vitamin D 500-200 MG-UNIT tablet Commonly known as:  OSCAL WITH D Take 1 tablet by mouth every morning.     Cranberry 250 MG Tabs Take 250 mg by mouth daily.   ferrous sulfate 325 (65 FE) MG tablet Take 650 mg by mouth every evening.   furosemide 20 MG tablet Commonly known as:  LASIX take 1 tablet by mouth once daily   glucose blood test strip Commonly known as:  ONE TOUCH ULTRA TEST Use to test blood sugar 1-2 times daily Dx code- E11.9   hydrALAZINE 10 MG tablet Commonly known as:  APRESOLINE Take 1 tablet (10 mg total) by mouth every 8 (eight) hours.   HYDROcodone-acetaminophen 5-325 MG tablet Commonly known as:  NORCO/VICODIN Take 1 tablet every 12 hours as needed for back pain   insulin aspart 100 UNIT/ML FlexPen Commonly known as:  NOVOLOG Inject 8 units three times a day with meals   isosorbide mononitrate 30 MG 24 hr tablet Commonly known as:  IMDUR Take 1 tablet (30 mg total) by mouth daily.   latanoprost 0.005 % ophthalmic solution Commonly known as:  XALATAN Place 1 drop into both eyes at bedtime.   LEVEMIR FLEXTOUCH 100 UNIT/ML Pen Generic drug:  Insulin Detemir INJECT 22 UNITS AT BEDTIME   levothyroxine 125 MCG tablet Commonly known as:  SYNTHROID, LEVOTHROID TAKE 1 TABLET DAILY   metoprolol succinate 25 MG 24 hr tablet Commonly known as:  TOPROL-XL Take 1 tablet (25 mg total) by mouth daily.   omeprazole 20 MG capsule Commonly known as:  PRILOSEC TAKE 1 CAPSULE DAILY   Pen Needles 31G X 6 MM Misc Use to inject insulin 4 times per day.   polyethylene glycol packet Commonly known as:  MIRALAX / GLYCOLAX Take 17 g by mouth daily as needed for moderate constipation.   rosuvastatin 20 MG tablet Commonly known as:  CRESTOR Take 1 tablet (20 mg total) by mouth daily. Take 1 tablet daily   VENTOLIN HFA 108 (90 Base) MCG/ACT inhaler Generic drug:  albuterol inhale 2 puffs by mouth INTO THE LUNGS every 6 hours if needed for wheezing   VITAMIN B 12 PO Take 1 tablet by mouth daily.   vitamin C 500 MG tablet Commonly known as:  ASCORBIC ACID Take  500 mg by mouth at bedtime.   Vitamin D (Ergocalciferol) 50000 units Caps capsule Commonly known as:  DRISDOL TAKE 1 CAPSULE BY MOUTH EVERY 14 DAYS       Allergies: No Known Allergies  Past Medical History:  Diagnosis Date  . AAA (abdominal aortic aneurysm) ([place])   . Bilateral hydronephrosis   . Carotid artery stenosis    carotid doppler 06/2012 - Right CCA/Bulb/ICA with chronic occlusion; L vertebral artery  with abnormal blood flow; L Bulb/Prox ICA  s/p endarterectomy with mild fibrous plaque, 50% diameter reduction  . CHF (congestive heart failure) ([place])   . Choledocholithiasis 2017  . CKD (chronic kidney disease), stage III   . Coronary artery disease due to lipid rich plaque cardiologist-  dr berry   s/p CABG x6 1997-- cath 12-09-2009 occluded vein to obtuse marginal branch and ramus branch with patent vien to PDA and patent LIMA to LAD, ef 40%-- Myoview 11-24-2011, nonischemic  . Diverticulosis   . Dyspnea on exertion   . GERD (gastroesophageal reflux disease)   . GI bleed   . History of sepsis    10-18-2014 w/ acute pyelonephritis  . Hyperlipidemia   . Hypertension   . Hypothyroidism   . Ischemic cardiomyopathy    03-25-2010-- per lasts echo EF  50-55%  . PAD (peripheral artery disease) ([place])    09/2010 LEAs - R ABI of 0.45, occluded fem-pop bypass graft, L ABI of 0.59 with occluded SFA; severe arterial insuff  . PVD (peripheral vascular disease) with claudication ([place])    last duplex 07-04-2015 -- Right CCA and ICA chronic occlusion, 62-83% LICA, Patent vertebral arteries w/ antegrade flow, bilateral normal subclavian arteries   . Retroperitoneal fibrosis   . Tubular adenoma of colon   . Type 2 diabetes mellitus ([place])    monitored by dr Dwyane Dee    Past Surgical History:  Procedure Laterality Date  . AORTA - BILATERAL FEMORAL ARTERY BYPASS GRAFT  1997   and RIGHT FEM-POP   . CARDIAC CATHETERIZATION  12-09-2009  dr al little   EF >40%-- occluded vein to OM & ramus  branches, patent vein to PDA, patent LIMA to LAD (Dr. [name], [facility]) - later had thrombectomy of R fem-pop bypass ad R common femoral & profunda femoris artery (Dr. Oneida Alar)  . CARDIOVASCULAR STRESS TEST  11-24-2011   dr berry   Low Risk study: fixed basal to mid inferior attenuation artifact, no reversible ischemia,  normal LV function and wall motion , ef 67%  . CAROTID ENDARTERECTOMY Bilateral right 1994//  left ?  Marland Kitchen CATARACT EXTRACTION W/ INTRAOCULAR LENS  IMPLANT, BILATERAL  2006  . CHOLECYSTECTOMY N/A 09/05/2015   Procedure: ATTEMPTED LAPAROSCOPIC CHOLECYSTECTOMY, EXPLORATORY LAPAROTOMY WITH CHOLECYSTECTOMY;  Surgeon: Autumn Messing III, MD;  Location: [facility];  Service: General;  Laterality: N/A;  . COLONOSCOPY    . COLONOSCOPY  10/21/2015  . COLONOSCOPY WITH PROPOFOL N/A 10/21/2015   Procedure: COLONOSCOPY WITH PROPOFOL;  Surgeon: Milus Banister, MD;  Location: [facility];  Service: Endoscopy;  Laterality: N/A;  . CORONARY ARTERY BYPASS GRAFT  1997   x6; internal mammary to LAD, SVG to ramus #1 & #2, SVG to OM, SVG to PDA,   . CYSTOSCOPY W/ RETROGRADES Right 08/01/2015   Procedure: CYSTOSCOPY WITH RETROGRADE PYELOGRAM;  Surgeon: Ardis Hughs, MD;  [facility];  Service: Urology;  Laterality: Right;  . CYSTOSCOPY W/ URETERAL STENT PLACEMENT  03/10/2012   Procedure: CYSTOSCOPY WITH RETROGRADE PYELOGRAM/URETERAL STENT PLACEMENT;  Surgeon: Hanley Ben, MD;  Location: [facility];  Service: Urology;  Laterality: Left;  . CYSTOSCOPY W/ URETERAL STENT PLACEMENT Bilateral 03/07/2015   Procedure: BILATERAL RETROGRADE PYELOGRAM AND RIGHT URETERAL STENT PLACEMENT;  Surgeon: Ardis Hughs, MD;  Location: [facility];  Service: Urology;  Laterality: Bilateral;  . CYSTOSCOPY W/ URETERAL STENT PLACEMENT Right 08/01/2015   Procedure: CYSTOSCOPY WITH STENT REPLACEMENT;  Surgeon: Ardis Hughs, MD;  Location: [facility];  Service: Urology;   Laterality: Right;  . CYSTOSCOPY W/ URETERAL STENT PLACEMENT Right 02/13/2016   Procedure: RIGHT URETERAL STENT EXCHANGE;  Surgeon: Ardis Hughs, MD;  Location: [facility];  Service: Urology;  Laterality: Right;  . ERCP N/A 09/03/2015   Procedure: ENDOSCOPIC RETROGRADE CHOLANGIOPANCREATOGRAPHY (ERCP);  Surgeon: Irene Shipper, MD;  Location: [facility];  Service: Endoscopy;  Laterality: N/A;  . ESOPHAGOGASTRODUODENOSCOPY    . ESOPHAGOGASTRODUODENOSCOPY N/A 10/19/2015   Procedure: ESOPHAGOGASTRODUODENOSCOPY (EGD);  Surgeon: Gatha Mayer, MD;  Location: [facility];  Service: Endoscopy;  Laterality: N/A;  . GIVENS CAPSULE STUDY  10/21/2015  . GIVENS CAPSULE STUDY N/A 10/21/2015   Procedure: GIVENS CAPSULE STUDY;  Surgeon: Milus Banister, MD;  Location: [facility];  Service: Endoscopy;  Laterality: N/A;  . REPAIR RIGHT FEMORAL PSEUDOANEUYSM/  RIGHT FEM-POP BYPASS GRAFT/  DEBRIDEMENT RIGHT LOWER EXTREMITIY VENOUS STATUS ULCERS X2  01-12-2005  . TOTAL ABDOMINAL HYSTERECTOMY W/ BILATERAL SALPINGOOPHORECTOMY  1986  . TRANSTHORACIC ECHOCARDIOGRAM  03/25/2010   EF 35-57%, LV systolic function low normal with mild inferoseptal hypocontractility; LA mildly dilated; mod MR; mild TR, RV systolic pressure elevated, mild pulm HTN; AV mildly sclerotic; mild pulm valve regurg; aortic root sclerosis/calcif     Family History  Problem Relation Age of Onset  . Congestive Heart Failure Mother   . Diabetes Mother   . Stroke Father   . Cancer Maternal Aunt        Breast cancer    Social History:  reports that she quit smoking about 7 years ago. She has a 60.00 pack-year smoking history. She has never used smokeless tobacco. She reports that she drinks about 1.8 oz of alcohol per week . She reports that she does not use drugs.  Review of Systems -     Hyperlipidemia: Has history of high LDL and triglycerides/low HDL  Was changed from Lipitor to Crestor because of higher LDL and she was not wanting to pay  the higher cost of adding Zetia   LDL is Controlled  Tends to have high triglycerides , Now appearing better LDL is also reduced   Lab Results  Component Value Date   CHOL 129 05/21/2016   HDL 35.90 (L) 05/21/2016   LDLCALC 60 05/21/2016   LDLDIRECT 69.0 02/02/2016   TRIG 167.0 (H) 05/21/2016   CHOLHDL 4 05/21/2016     Vitamin D deficiency: She has been on supplements and taking her 50,000 units every other week  Her level is normal  Still has on and off low back pain which is chronic    History of UTI:   currently symptomatic.     Examination:   BP 128/68   Pulse 71   Ht 5\' 6"  (1.676 m)   Wt 145 lb (65.8 kg)   SpO2 95%   BMI 23.40 kg/m   Body mass index is 23.4 kg/m.        Assesment/PLAN:    RENAL insufficiency:   Has some variability, slightly higher compared to last visit  Appears to be from vascular insufficiency and glomerular sclerosis No history of diabetic nephropathy   ANEMIA: Will need follow-up   Recommended taking iron with food not at bedtime She probably will have mild chronic anemia related to renal insufficiency  DIABETES: Blood sugars have  been mostly well-controlled, including after meals She does appear to have low normal readings fasting and occasionally after supper also Last A1c was 6.3  She will cut down her Levemir by 2  units as also NovoLog by 2 units  HYPOTHYROIDISM: Her TSH is now low with taking 125 g 6 days a week Since she has just ordered her refills will have her take 5-1/2 tablets a week now  Urinalysis to be done today  Patient Instructions  Thyroid pill: skip 1/7 and 1/2 another weekday   Levemir 14 units , Novolog 6 twice a day   Total visit time for reviewing office and hospital records, labs, evaluation and management, counseling = 25 minutes   [name] [name]  07/29/16    Note: This office note was prepared with Dragon voice recognition system technology. Any transcriptional errors that result from this  process are unintentional.

## 2016-07-29 NOTE — Patient Instructions (Addendum)
Thyroid pill: skip 1/7 and 1/2 another weekday   Levemir 14 units , Novolog 6 twice a day

## 2016-07-29 NOTE — Addendum Note (Signed)
Addended by: Kaylyn Lim I on: 07/29/2016 01:59 PM   Modules accepted: Orders

## 2016-07-30 NOTE — Telephone Encounter (Signed)
REFILL 

## 2016-08-02 NOTE — Progress Notes (Signed)
Please call to let patient know that the urine shows infection, can she come for culture test today?

## 2016-08-03 ENCOUNTER — Ambulatory Visit (INDEPENDENT_AMBULATORY_CARE_PROVIDER_SITE_OTHER): Payer: Medicare Other | Admitting: Podiatry

## 2016-08-03 ENCOUNTER — Encounter: Payer: Self-pay | Admitting: Podiatry

## 2016-08-03 ENCOUNTER — Other Ambulatory Visit: Payer: Self-pay

## 2016-08-03 ENCOUNTER — Other Ambulatory Visit: Payer: Medicare Other

## 2016-08-03 DIAGNOSIS — N390 Urinary tract infection, site not specified: Secondary | ICD-10-CM

## 2016-08-03 DIAGNOSIS — B351 Tinea unguium: Secondary | ICD-10-CM | POA: Diagnosis not present

## 2016-08-03 DIAGNOSIS — E1151 Type 2 diabetes mellitus with diabetic peripheral angiopathy without gangrene: Secondary | ICD-10-CM

## 2016-08-03 NOTE — Progress Notes (Signed)
Complaint:  Visit Type: Patient returns to my office for continued preventative foot care services. Complaint: Patient states" my nails have grown long and thick and become painful to walk and wear shoes" Patient has been diagnosed with DM The patient presents for preventative foot care services. No changes to ROS.  She says the callus under her right big toe joint is  pain-free.  Podiatric Exam: Vascular: dorsalis pedis and posterior tibial pulses are diminished   bilateral. Capillary return is immediate. Temperature gradient is WNL. Skin turgor WNL  Sensorium: Diminished Semmes Weinstein monofilament test. Normal tactile sensation bilaterally. Nail Exam: Pt has thick disfigured discolored nails with subungual debris noted bilateral entire nail hallux through fifth toenails Ulcer Exam: There is no evidence of ulcer or pre-ulcerative changes or infection. Orthopedic Exam: Muscle tone and strength are WNL. No limitations in general ROM. No crepitus or effusions noted. Foot type and digits show no abnormalities. Bony prominences are unremarkable. Skin: No Porokeratosis. No infection or ulcers  Diagnosis:  Onychomycosis, , Pain in right toe, pain in left toes  Treatment & Plan Procedures and Treatment: Consent by patient was obtained for treatment procedures. The patient understood the discussion of treatment and procedures well. All questions were answered thoroughly reviewed. Debridement of mycotic and hypertrophic toenails, 1 through 5 bilateral and clearing of subungual debris. No ulceration, no infection noted.   Return Visit-Office Procedure: Patient instructed to return to the office for a follow up visit 3 months for continued evaluation and treatment.    Lowana Hable DPM 

## 2016-08-04 LAB — URINE CULTURE

## 2016-08-05 NOTE — Progress Notes (Signed)
Please call to let patient know that the culture does not show bacterial growth and she needs to talk to her urologist about urine showing white blood cells

## 2016-08-09 DIAGNOSIS — N135 Crossing vessel and stricture of ureter without hydronephrosis: Secondary | ICD-10-CM | POA: Diagnosis not present

## 2016-08-16 ENCOUNTER — Other Ambulatory Visit: Payer: Self-pay | Admitting: Urology

## 2016-08-18 ENCOUNTER — Other Ambulatory Visit: Payer: Self-pay | Admitting: Endocrinology

## 2016-08-25 ENCOUNTER — Telehealth: Payer: Self-pay | Admitting: Endocrinology

## 2016-08-25 NOTE — Telephone Encounter (Signed)
Patient attempting to return phone call. Asked for call back when possible.

## 2016-08-26 NOTE — Telephone Encounter (Signed)
Requested a call back from the patient to further discuss.  

## 2016-08-26 NOTE — Telephone Encounter (Signed)
Left vm requesting a call back

## 2016-09-07 DIAGNOSIS — H40053 Ocular hypertension, bilateral: Secondary | ICD-10-CM | POA: Diagnosis not present

## 2016-09-07 NOTE — Progress Notes (Signed)
[facility]IOLOGY Kerin Ransom 02-04-16 CHEST XRAY 01-23-16 EPIC VASCULAR US CAROTID 07-02-16 EPIC EKG 03-23-16 ABNORMAL EPIC, WILL REPEAT WITH PRE OP 09-09-16

## 2016-09-07 NOTE — Patient Instructions (Addendum)
[name] [name]  09/07/2016   Your procedure is scheduled on: Friday 09-17-16  Report to [facility] Main  Entrance Take [place]  elevators to 3rd floor to  [place] at 1100 AM.   Call this number if you have problems the morning of surgery [phone]    Remember: ONLY 1 PERSON MAY GO WITH YOU TO SHORT STAY TO GET  READY MORNING OF [place].  Do not eat food:After Midnight.CLEAR LIQUIDS FROM MIDNIGHT UNTIL 700AM -THEN NOTHING BY MOUTH AFTER 700 AM DAY OF SURGERY     Take these medicines the morning of surgery with A SIP OF WATER: HYDROCODONE IF NEEDED, ISOSORBIDE MONONITRATE (IMDUR, LEVOTHYROXINE (SYNTHROID), METOPROLOL SUCCINATE(TOPROLOL XL), OMEPRAZOLE (PRILOSEC), ROSUVASTATIN (CRESTOR), VENTOLIN INHALER IF NEEDED AND BRING INHALER WITH YOU.  DO NOT TAKE ANY DIABETIC MEDICATIONS DAY OF YOUR SURGERY                               You may not have any metal on your body including hair pins and              piercings  Do not wear jewelry, make-up, lotions, powders or perfumes, deodorant             Do not wear nail polish.              Men may shave face and neck.   Do not bring valuables to the hospital. [place].  Contacts, dentures or bridgework may not be worn into surgery.  Leave suitcase in the car. After surgery it may be brought to your room.     Patients discharged the day of surgery will not be allowed to drive home.  Name and phone number of your driver:daughter kelly Fischel cell [phone]  Special Instructions: N/A              Please read over the following fact sheets you were given: _____________________________________________________________________             How to Manage Your Diabetes Before and After Surgery  Why is it important to control my blood sugar before and after surgery? . Improving blood sugar levels before and after surgery helps healing and can limit  problems. . A way of improving blood sugar control is eating a healthy diet by: o  Eating less sugar and carbohydrates o  Increasing activity/exercise o  Talking with your doctor about reaching your blood sugar goals . High blood sugars (greater than 180 mg/dL) can raise your risk of infections and slow your recovery, so you will need to focus on controlling your diabetes during the weeks before surgery. . Make sure that the doctor who takes care of your diabetes knows about your planned surgery including the date and location.  How do I manage my blood sugar before surgery? . Check your blood sugar at least 4 times a day, starting 2 days before surgery, to make sure that the level is not too high or low. o Check your blood sugar the morning of your surgery when you wake up and every 2 hours until you get to the Short Stay unit. . If your blood sugar is less than 70 mg/dL, you will need to treat for  low blood sugar: o Do not take insulin. o Treat a low blood sugar (less than 70 mg/dL) with  cup of clear juice (cranberry or apple), 4 glucose tablets, OR glucose gel. o Recheck blood sugar in 15 minutes after treatment (to make sure it is greater than 70 mg/dL). If your blood sugar is not greater than 70 mg/dL on recheck, call [phone] for further instructions. . Report your blood sugar to the short stay nurse when you get to Short Stay.  . If you are admitted to the hospital after surgery: o Your blood sugar will be checked by the staff and you will probably be given insulin after surgery (instead of oral diabetes medicines) to make sure you have good blood sugar levels. o The goal for blood sugar control after surgery is 80-180 mg/dL.   WHAT DO I DO ABOUT MY DIABETES MEDICATION?    . THE NIGHT BEFORE SURGERY, take 7 units of LEVEMIR       insulin.      . If your CBG is greater than 220 mg/dL, you may take  of your sliding scale  . (correction) dose of insulin.novolog     Patient  Signature:  Date:   Nurse Signature:  Date:   Reviewed and Endorsed by [facility] Patient Education Committee, August 2015   CLEAR LIQUID DIET   Foods Allowed                                                                     Foods Excluded  Coffee and tea, regular and decaf                             liquids that you cannot  Plain Jell-O in any flavor                                             see through such as: Fruit ices (not with fruit pulp)                                     milk, soups, orange juice  Iced Popsicles                                    All solid food Carbonated beverages, regular and diet                                    Cranberry, grape and apple juices Sports drinks like Gatorade Lightly seasoned clear broth or consume(fat free) Sugar, honey syrup  Sample Menu Breakfast                                Lunch  Supper Cranberry juice                    Beef broth                            Chicken broth Jell-O                                     Grape juice                           Apple juice Coffee or tea                        Jell-O                                      Popsicle                                                Coffee or tea                        Coffee or tea  _____________________________________________________________________  [facility] - Preparing for Surgery Before surgery, you can play an important role.  Because skin is not sterile, your skin needs to be as free of germs as possible.  You can reduce the number of germs on your skin by washing with CHG (chlorahexidine gluconate) soap before surgery.  CHG is an antiseptic cleaner which kills germs and bonds with the skin to continue killing germs even after washing. Please DO NOT use if you have an allergy to CHG or antibacterial soaps.  If your skin becomes reddened/irritated stop using the CHG and inform your nurse when you arrive at Short  Stay. Do not shave (including legs and underarms) for at least 48 hours prior to the first CHG shower.  You may shave your face/neck. Please follow these instructions carefully:  1.  Shower with CHG Soap the night before surgery and the  morning of Surgery.  2.  If you choose to wash your hair, wash your hair first as usual with your  normal  shampoo.  3.  After you shampoo, rinse your hair and body thoroughly to remove the  shampoo.                           4.  Use CHG as you would any other liquid soap.  You can apply chg directly  to the skin and wash                       Gently with a scrungie or clean washcloth.  5.  Apply the CHG Soap to your body ONLY FROM THE NECK DOWN.   Do not use on face/ open                           Wound or open sores. Avoid contact with eyes, ears mouth and genitals (private parts).  Wash face,  Genitals (private parts) with your normal soap.             6.  Wash thoroughly, paying special attention to the area where your surgery  will be performed.  7.  Thoroughly rinse your body with warm water from the neck down.  8.  DO NOT shower/wash with your normal soap after using and rinsing off  the CHG Soap.                9.  Pat yourself dry with a clean towel.            10.  Wear clean pajamas.            11.  Place clean sheets on your bed the night of your first shower and do not  sleep with pets. Day of Surgery : Do not apply any lotions/deodorants the morning of surgery.  Please wear clean clothes to the hospital/surgery center.

## 2016-09-09 ENCOUNTER — Encounter (HOSPITAL_COMMUNITY): Payer: Self-pay

## 2016-09-09 ENCOUNTER — Encounter (HOSPITAL_COMMUNITY)
Admission: RE | Admit: 2016-09-09 | Discharge: 2016-09-09 | Disposition: A | Discharge status: Home / Self Care | Payer: Medicare Other | Source: Ambulatory Visit | Attending: Urology | Admitting: Urology

## 2016-09-09 DIAGNOSIS — R001 Bradycardia, unspecified: Secondary | ICD-10-CM | POA: Insufficient documentation

## 2016-09-09 DIAGNOSIS — Z01810 Encounter for preprocedural cardiovascular examination: Secondary | ICD-10-CM | POA: Diagnosis not present

## 2016-09-09 DIAGNOSIS — Z01812 Encounter for preprocedural laboratory examination: Secondary | ICD-10-CM | POA: Diagnosis not present

## 2016-09-09 LAB — BASIC METABOLIC PANEL
ANION GAP: 10 (ref 5–15)
BUN: 31 mg/dL — ABNORMAL HIGH (ref 6–20)
CALCIUM: 8.6 mg/dL — AB (ref 8.9–10.3)
CO2: 24 mmol/L (ref 22–32)
CREATININE: 1.49 mg/dL — AB (ref 0.44–1.00)
Chloride: 108 mmol/L (ref 101–111)
GFR calc Af Amer: 38 mL/min — ABNORMAL LOW (ref >60)
GFR, EST NON AFRICAN AMERICAN: 33 mL/min — AB (ref >60)
Glucose, Bld: 149 mg/dL — ABNORMAL HIGH (ref 65–99)
Potassium: 3.8 mmol/L (ref 3.5–5.1)
Sodium: 142 mmol/L (ref 135–145)

## 2016-09-09 LAB — GLUCOSE, CAPILLARY: GLUCOSE-CAPILLARY: 146 mg/dL — AB (ref 65–99)

## 2016-09-09 LAB — CBC
HCT: 33.3 % — ABNORMAL LOW (ref 36.0–46.0)
HEMOGLOBIN: 10.6 g/dL — AB (ref 12.0–15.0)
MCH: 28.1 pg (ref 26.0–34.0)
MCHC: 31.8 g/dL (ref 30.0–36.0)
MCV: 88.3 fL (ref 78.0–100.0)
PLATELETS: 206 10*3/uL (ref 150–400)
RBC: 3.77 MIL/uL — ABNORMAL LOW (ref 3.87–5.11)
RDW: 14.3 % (ref 11.5–15.5)
WBC: 7.1 10*3/uL (ref 4.0–10.5)

## 2016-09-10 LAB — HEMOGLOBIN A1C
HEMOGLOBIN A1C: 5.9 % — AB (ref 4.8–5.6)
MEAN PLASMA GLUCOSE: 123 mg/dL

## 2016-09-13 ENCOUNTER — Ambulatory Visit: Payer: Medicare Other | Admitting: Cardiology

## 2016-09-16 MED ORDER — GENTAMICIN IN SALINE 1-0.9 MG/ML-% IV SOLN
100.0000 mg | INTRAVENOUS | Status: AC
  Administered 2016-09-17: 100 mg via INTRAVENOUS
  Filled 2016-09-16: qty 100

## 2016-09-17 ENCOUNTER — Ambulatory Visit (HOSPITAL_COMMUNITY)
Admission: RE | Admit: 2016-09-17 | Discharge: 2016-09-17 | Disposition: A | Discharge status: Home / Self Care | Payer: Medicare Other | Source: Ambulatory Visit | Attending: Urology | Admitting: Urology

## 2016-09-17 ENCOUNTER — Ambulatory Visit (HOSPITAL_COMMUNITY): Payer: Medicare Other | Admitting: Anesthesiology

## 2016-09-17 ENCOUNTER — Encounter (HOSPITAL_COMMUNITY): Payer: Self-pay | Admitting: Anesthesiology

## 2016-09-17 ENCOUNTER — Encounter (HOSPITAL_COMMUNITY)
Admission: RE | Disposition: A | Discharge status: Home / Self Care | Payer: Self-pay | Source: Ambulatory Visit | Attending: Urology

## 2016-09-17 ENCOUNTER — Ambulatory Visit (HOSPITAL_COMMUNITY): Payer: Medicare Other

## 2016-09-17 DIAGNOSIS — K922 Gastrointestinal hemorrhage, unspecified: Secondary | ICD-10-CM | POA: Diagnosis not present

## 2016-09-17 DIAGNOSIS — I2510 Atherosclerotic heart disease of native coronary artery without angina pectoris: Secondary | ICD-10-CM | POA: Diagnosis not present

## 2016-09-17 DIAGNOSIS — N281 Cyst of kidney, acquired: Secondary | ICD-10-CM | POA: Insufficient documentation

## 2016-09-17 DIAGNOSIS — I509 Heart failure, unspecified: Secondary | ICD-10-CM | POA: Insufficient documentation

## 2016-09-17 DIAGNOSIS — E7800 Pure hypercholesterolemia, unspecified: Secondary | ICD-10-CM | POA: Insufficient documentation

## 2016-09-17 DIAGNOSIS — E1151 Type 2 diabetes mellitus with diabetic peripheral angiopathy without gangrene: Secondary | ICD-10-CM | POA: Diagnosis not present

## 2016-09-17 DIAGNOSIS — E785 Hyperlipidemia, unspecified: Secondary | ICD-10-CM | POA: Diagnosis not present

## 2016-09-17 DIAGNOSIS — Z794 Long term (current) use of insulin: Secondary | ICD-10-CM | POA: Insufficient documentation

## 2016-09-17 DIAGNOSIS — Z951 Presence of aortocoronary bypass graft: Secondary | ICD-10-CM | POA: Diagnosis not present

## 2016-09-17 DIAGNOSIS — Z7982 Long term (current) use of aspirin: Secondary | ICD-10-CM | POA: Insufficient documentation

## 2016-09-17 DIAGNOSIS — Z87891 Personal history of nicotine dependence: Secondary | ICD-10-CM | POA: Insufficient documentation

## 2016-09-17 DIAGNOSIS — Z841 Family history of disorders of kidney and ureter: Secondary | ICD-10-CM | POA: Insufficient documentation

## 2016-09-17 DIAGNOSIS — Z419 Encounter for procedure for purposes other than remedying health state, unspecified: Secondary | ICD-10-CM

## 2016-09-17 DIAGNOSIS — K219 Gastro-esophageal reflux disease without esophagitis: Secondary | ICD-10-CM | POA: Diagnosis not present

## 2016-09-17 DIAGNOSIS — I10 Essential (primary) hypertension: Secondary | ICD-10-CM | POA: Diagnosis not present

## 2016-09-17 DIAGNOSIS — I110 Hypertensive heart disease with heart failure: Secondary | ICD-10-CM | POA: Insufficient documentation

## 2016-09-17 DIAGNOSIS — Z79891 Long term (current) use of opiate analgesic: Secondary | ICD-10-CM | POA: Diagnosis not present

## 2016-09-17 DIAGNOSIS — E039 Hypothyroidism, unspecified: Secondary | ICD-10-CM | POA: Diagnosis not present

## 2016-09-17 DIAGNOSIS — Z79899 Other long term (current) drug therapy: Secondary | ICD-10-CM | POA: Insufficient documentation

## 2016-09-17 DIAGNOSIS — N135 Crossing vessel and stricture of ureter without hydronephrosis: Secondary | ICD-10-CM | POA: Diagnosis not present

## 2016-09-17 HISTORY — PX: CYSTOSCOPY W/ URETERAL STENT PLACEMENT: SHX1429

## 2016-09-17 LAB — GLUCOSE, CAPILLARY
GLUCOSE-CAPILLARY: 130 mg/dL — AB (ref 65–99)
GLUCOSE-CAPILLARY: 106 mg/dL — AB (ref 65–99)

## 2016-09-17 SURGERY — CYSTOSCOPY, WITH RETROGRADE PYELOGRAM AND URETERAL STENT INSERTION
Anesthesia: General | Laterality: Right

## 2016-09-17 MED ORDER — LIDOCAINE 2% (20 MG/ML) 5 ML SYRINGE
INTRAMUSCULAR | Status: AC
  Filled 2016-09-17: qty 5

## 2016-09-17 MED ORDER — LIDOCAINE HCL (CARDIAC) 20 MG/ML IV SOLN
INTRAVENOUS | Status: DC | PRN
  Administered 2016-09-17: 50 mg via INTRAVENOUS

## 2016-09-17 MED ORDER — LIDOCAINE HCL 2 % EX GEL
CUTANEOUS | Status: AC
  Filled 2016-09-17: qty 5

## 2016-09-17 MED ORDER — LACTATED RINGERS IV SOLN
INTRAVENOUS | Status: DC
  Administered 2016-09-17 (×2): via INTRAVENOUS

## 2016-09-17 MED ORDER — PHENAZOPYRIDINE HCL 200 MG PO TABS: 200.0000 mg | ORAL_TABLET | Freq: Three times a day (TID) | ORAL | 0 refills | Status: DC | PRN

## 2016-09-17 MED ORDER — PROPOFOL 10 MG/ML IV BOLUS
INTRAVENOUS | Status: DC | PRN
  Administered 2016-09-17: 100 mg via INTRAVENOUS

## 2016-09-17 MED ORDER — IOHEXOL 300 MG/ML  SOLN
INTRAMUSCULAR | Status: DC | PRN
  Administered 2016-09-17: 10 mL

## 2016-09-17 MED ORDER — SODIUM CHLORIDE 0.9 % IR SOLN
Status: DC | PRN
  Administered 2016-09-17: 4000 mL via INTRAVESICAL

## 2016-09-17 MED ORDER — ONDANSETRON HCL 4 MG/2ML IJ SOLN
INTRAMUSCULAR | Status: DC | PRN
  Administered 2016-09-17: 4 mg via INTRAVENOUS

## 2016-09-17 MED ORDER — ONDANSETRON HCL 4 MG/2ML IJ SOLN
INTRAMUSCULAR | Status: AC
  Filled 2016-09-17: qty 2

## 2016-09-17 MED ORDER — FENTANYL CITRATE (PF) 100 MCG/2ML IJ SOLN
INTRAMUSCULAR | Status: AC
  Filled 2016-09-17: qty 2

## 2016-09-17 MED ORDER — FENTANYL CITRATE (PF) 100 MCG/2ML IJ SOLN
INTRAMUSCULAR | Status: DC | PRN
  Administered 2016-09-17: 50 ug via INTRAVENOUS

## 2016-09-17 MED ORDER — LIDOCAINE HCL 2 % EX GEL
CUTANEOUS | Status: DC | PRN
  Administered 2016-09-17: 1

## 2016-09-17 MED ORDER — PROPOFOL 10 MG/ML IV BOLUS
INTRAVENOUS | Status: AC
  Filled 2016-09-17: qty 40

## 2016-09-17 MED ORDER — FENTANYL CITRATE (PF) 100 MCG/2ML IJ SOLN: 25.0000 ug | INTRAMUSCULAR | Status: DC | PRN

## 2016-09-17 MED ORDER — PROPOFOL 10 MG/ML IV BOLUS
INTRAVENOUS | Status: AC
  Filled 2016-09-17: qty 20

## 2016-09-17 SURGICAL SUPPLY — 19 items
BAG URO CATCHER STRL LF (MISCELLANEOUS) ×3 IMPLANT
BASKET DAKOTA 1.9FR 11X120 (BASKET) IMPLANT
BASKET ZERO TIP NITINOL 2.4FR (BASKET) IMPLANT
CATH URET 5FR 28IN OPEN ENDED (CATHETERS) ×3 IMPLANT
CLOTH BEACON ORANGE TIMEOUT ST (SAFETY) ×3 IMPLANT
COVER SURGICAL LIGHT HANDLE (MISCELLANEOUS) ×3 IMPLANT
GLOVE BIOGEL M STRL SZ7.5 (GLOVE) ×3 IMPLANT
GOWN STRL REUS W/TWL XL LVL3 (GOWN DISPOSABLE) ×3 IMPLANT
GUIDEWIRE ANG ZIPWIRE 038X150 (WIRE) IMPLANT
GUIDEWIRE STR DUAL SENSOR (WIRE) ×3 IMPLANT
MANIFOLD NEPTUNE II (INSTRUMENTS) ×3 IMPLANT
PACK CYSTO (CUSTOM PROCEDURE TRAY) ×3 IMPLANT
SHEATH ACCESS URETERAL 24CM (SHEATH) IMPLANT
SHEATH ACCESS URETERAL 38CM (SHEATH) IMPLANT
SHEATH ACCESS URETERAL 54CM (SHEATH) IMPLANT
STENT URET 6FRX24 CONTOUR (STENTS) ×3 IMPLANT
TUBING CONNECTING 10 (TUBING) ×2 IMPLANT
TUBING CONNECTING 10' (TUBING) ×1
WIRE COONS/BENSON .038X145CM (WIRE) IMPLANT

## 2016-09-17 NOTE — Anesthesia Procedure Notes (Signed)
Procedure Name: LMA Insertion Date/Time: 09/17/2016 2:48 PM Performed by: Glory Buff Pre-anesthesia Checklist: Patient identified, Emergency Drugs available, Suction available, Patient being monitored and Timeout performed Patient Re-evaluated:Patient Re-evaluated prior to induction Oxygen Delivery Method: Circle system utilized Preoxygenation: Pre-oxygenation with 100% oxygen Induction Type: IV induction Ventilation: Mask ventilation without difficulty LMA: LMA inserted LMA Size: 4.0 Number of attempts: 1 Placement Confirmation: positive ETCO2 Tube secured with: Tape Dental Injury: Teeth and Oropharynx as per pre-operative assessment

## 2016-09-17 NOTE — Transfer of Care (Signed)
Immediate Anesthesia Transfer of Care Note  Patient: [name] [name]  Procedure(s) Performed: Procedure(s): CYSTOSCOPY WITH RIGHT  RETROGRADE PYELOGRAM RIGHT URETERAL STENT EXCHANGE (Right)  Patient Location: PACU  Anesthesia Type:General  Level of Consciousness: awake, alert  and oriented  Airway & Oxygen Therapy: Patient Spontanous Breathing and Patient connected to face mask oxygen  Post-op Assessment: Report given to RN and Post -op Vital signs reviewed and stable  Post vital signs: Reviewed and stable  Last Vitals:  Vitals:   09/17/16 1102  BP: (!) 154/62  Pulse: 66  Resp: 18  Temp: 36.7 C    Last Pain:  Vitals:   09/17/16 1102  TempSrc: Oral      Patients Stated Pain Goal: 4 (21/78/37 [id number])  Complications: No apparent anesthesia complications

## 2016-09-17 NOTE — Discharge Instructions (Signed)
DISCHARGE INSTRUCTIONS FOR KIDNEY STONE/URETERAL STENT   MEDICATIONS:  1.  Resume all your other meds from home - except do not take any extra narcotic pain meds that you may have at home.  2. Pyridium is to help with the burning/stinging when you urinate.     ACTIVITY:  1. No strenuous activity x 1week  2. No driving while on narcotic pain medications  3. Drink plenty of water  4. Continue to walk at home - you can still get blood clots when you are at home, so keep active, but don't over do it.  5. May return to work/school tomorrow or when you feel ready   BATHING:  1. You can shower and we recommend daily showers  2. You have a string coming from your urethra: The stent string is attached to your ureteral stent. Do not pull on this.   SIGNS/SYMPTOMS TO CALL:  Please call us if you have a fever greater than 101.5, uncontrolled nausea/vomiting, uncontrolled pain, dizziness, unable to urinate, bloody urine, chest pain, shortness of breath, leg swelling, leg pain, redness around wound, drainage from wound, or any other concerns or questions.   You can reach [place] at [phone].   FOLLOW-UP:  1. You will be scheduled for follow-up in 5 months.

## 2016-09-17 NOTE — Op Note (Signed)
Preoperative diagnosis:  1. Right ureteral obstruction   Postoperative diagnosis:  1. same   Procedure: 1. Cystoscopy, right retrograde pyelogram with interpretation 2. 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  EBL: Minimal  Specimens: None  Indication: [name] [name] is a 76 y.o. patient with right distal ureteral stricture that is being managed with right indwelling stent.  She presents today for stent exchange.  After reviewing the management options for treatment, he elected to proceed with the above surgical procedure(s). We have discussed the potential benefits and risks of the procedure, side effects of the proposed treatment, the likelihood of the patient achieving the goals of the procedure, and any potential problems that might occur during the procedure or recuperation. Informed consent has been obtained.  Description of procedure:  The patient was taken to the operating room and general anesthesia was induced.  The patient was placed in the dorsal lithotomy position, prepped and draped in the usual sterile fashion, and preoperative antibiotics were administered. A preoperative time-out was performed.   A 30 deg cystoscope was gently inserted into the patient's urethra and into her bladder under visual guidance.  The bladder was fully inspected and there was no significant abnormality.  The stent was then grasp and pulled to the urethral meatus.  The stent was then wired with a 0.038 sensor wire and the stent removed over the wire.  The wire was then exchanged with the 73F open ended ureteral catheter and a retrograde pyelogram was performed with the above findings.  The wire was then replaced and passed into the right renal pelvis.  A 24cm x 56F JJ stent was then advanced over the wire and into the renal pelvis.  The  wire was then slowly backed out ensuring that the stent curled.  I then advanced the stent to the urethral meatus and with pressure on the stent removed the wire.  Once the wire was removed the stent popped into the bladder as confirmed by fluoro.  The bladder was emptied and the patient subsequently extubated and returned to the PACU in stable condition.  She will be rescheduled for stent exchange in 6 months.  Ardis Hughs, M.D.

## 2016-09-17 NOTE — H&P (Signed)
Ureteral Stricture  HPI: [name] [name] is a 76 year-old female established patient who is here for further eval and management of a ureteral stricture.  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 The left side was initially obstructed and managed with a stent. Differential function demonstrated little function and stent was removed. Right hydro developed in Fall 2016. No being managed with stents, not a good surgical candidate otherwise. July 2017 patient was having blood in stool. Plavix medication was causing blood vessels to bleed and therefore had gallbladder removed and plavix was stopped.  01/26/16 BUN 60.000 and creatinine, serum 2.380.    June 2018: Stent exchange last completed in December 2017. Pt has tolerated her stent well. No worsening LUTS. Denies right sided flank pain or discomfort. Denies recent fevers or infections. No recent abx treatment.   Her problem has been present for a few years. She has had previous surgery as a possible source of her ureteral stricture. She has had chronic stent for treatment of their ureteral stricture.   She is currently not having trouble urinating. She is not having problems getting her urine stream started. The patient does not complain of urinary leakage. She is not urinating more frequently now than usual. She does not dribble at the end of urination.   She is not having flank pain. She does have a history of urinary infections. Her renal function is normal. She did not see blood in her urine.     ALLERGIES: No Allergies    MEDICATIONS: Crestor 10 mg tablet  Albuterol Sulfate 2.5 mg/3 ml (0.083 %) vial, nebulizer Inhalation  Aspirin 81 MG TABS Oral  Calcium/Vitamin D/Minerals TABS Oral  Ferrous Sulfate 325 (65 Fe) MG Oral Tablet Delayed Release Oral  Furosemide 20 mg tablet Oral  Hydralazine Hcl  Hydrocodone-Acetaminophen 5 mg-500 mg tablet Oral  Levemir FlexPen 100 UNIT/ML SOLN Subcutaneous  Magnesium Oxide 400 MG Oral  Tablet Oral  Metoprolol Succinate 25 mg tablet, extended release 24 hr Oral  Novolog Flexpen 100 unit/ml insulin pen Subcutaneous  Omeprazole 20 mg capsule,delayed release Oral  Synthroid 137 MCG Oral Tablet Oral  Vitamin B-12 TABS Oral  Vitamin C TABS Oral  Vitamin D2 50,000 unit capsule Oral     GU PSH: Cystoscopy Insert Stent, Right - 02/13/2016, 08/06/2015, 03/20/2015, 2014, 2014 Hysterectomy Unilat SO - 2014      PSH Notes: Cystoscopy With Insertion Of Ureteral Stent Right, Cystoscopy With Insertion Of Ureteral Stent Right, Cesarean Section, Hysterectomy, Coronary Artery Single Venous Bypass Graft, Cystoscopy With Insertion Of Ureteral Stent, Cystoscopy With Insertion Of Ureteral Stent Left   NON-GU PSH: CABG (coronary artery bypass grafting) - 2014 Cesarean Delivery Only - 2014 Cholecystectomy (open), 09/2015    GU PMH: Ureteral stricture, Right, 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 This is being managed withchronic indwelling stents. She is due to have her stent changed soon, her last stent was exchanged 6 months prior. 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 - 02/05/2016 Urinary Tract Inf, Unspec site, Urinary tract infection - 07/28/2015 Hydronephrosis Unspec, Bilateral hydronephrosis - 03/27/2015, Hydronephrosis, left, - 2016 Renal cyst, Renal cyst, acquired - 2014      PMH Notes: The patient has a history of retroperitoneal fibrosis and in 2015 she had left sided hydronephrosis. She had a stent placed in the spring of 2015 followed by renogram demonstrating 18% function on the left kidney. Her stent was subsequently removed.  Recently she developed bilateral  hydronephrosis, renogram demonstrated bilateral obstruction with 85% right function and 15% left sided function a significant change from her previous ultrasounds and renograms. In Jan 2015 she had no hydro or evidence of right  sided obstruction.   02/21/15 bilateral RPGs- left side showed a normal distal ureter to the pelvic brim where it stopped abruptly and I was unable to opacify the proximal portion. On the right side showed a normal distal ureter with abrupt narrowing at the pelvic brim and proximal dilation. A right sided stent was placed.   She has a history of recurrent infections. In 2015 she had 4 Escherichia coli infections.   03/2015: treated for UTI - keflex - no growth. recommdended lactobacillus, as well as cranberry tablets twice daily   2/17: BUN/Cr- 30/1.78     NON-GU PMH: Encounter for general adult medical examination without abnormal findings, Encounter for preventive health examination - 01/14/2015 Personal history of other diseases of the circulatory system, History of hypertension - 2014, History of cardiac disorder, - 2014 Personal history of other endocrine, nutritional and metabolic disease, History of hypothyroidism - 2014, History of diabetes mellitus, - 2014, History of hypercholesterolemia, - 2014    FAMILY HISTORY: Diabetes - Mother Family Health Status Number - Father Father Deceased At Age74 ___ - Father Hypertension - Runs In Family Mother Deceased At Age 53 from diabetic complicati - Father nephrolithiasis - Runs In Family Transient Ischemic Attack - Mother   SOCIAL HISTORY: Marital Status: Single     Notes: Former smoker, Tobacco use, Marital History - Single, Retired From Work, Alcohol Use, Caffeine Use   REVIEW OF SYSTEMS:    GU Review Female:   Patient reports get up at night to urinate. Patient denies frequent urination, hard to postpone urination, burning /pain with urination, leakage of urine, stream starts and stops, trouble starting your stream, have to strain to urinate, and being pregnant.  Gastrointestinal (Upper):   Patient denies nausea, vomiting, and indigestion/ heartburn.  Gastrointestinal (Lower):   Patient denies diarrhea and constipation.   Constitutional:   Patient denies fever, night sweats, weight loss, and fatigue.  Skin:   Patient denies skin rash/ lesion and itching.  Eyes:   Patient denies blurred vision and double vision.  Ears/ Nose/ Throat:   Patient denies sore throat and sinus problems.  Hematologic/Lymphatic:   Patient denies swollen glands and easy bruising.  Cardiovascular:   Patient denies leg swelling and chest pains.  Respiratory:   Patient denies cough and shortness of breath.  Endocrine:   Patient denies excessive thirst.  Musculoskeletal:   Patient denies back pain and joint pain.  Neurological:   Patient denies headaches and dizziness.  Psychologic:   Patient denies depression and anxiety.   VITAL SIGNS:      08/09/2016 10:31 AM  Weight 143 lb / 64.86 kg  Height 66 in / 167.64 cm  BP 147/71 mmHg  Pulse 66 /min  Temperature 97.9 F / 37 C  BMI 23.1 kg/m   MULTI-SYSTEM PHYSICAL EXAMINATION:    Constitutional: Well-nourished. No physical deformities. Normally developed. Good grooming.  Neck: Neck symmetrical, not swollen. Normal tracheal position.  Respiratory: No labored breathing, no use of accessory muscles. CTA.  Cardiovascular: Normal temperature, normal extremity pulses, no swelling, no varicosities. RRR.  Lymphatic: No enlargement of neck, axillae, groin.  Skin: No paleness, no jaundice, no cyanosis. No lesion, no ulcer, no rash.  Neurologic / Psychiatric: Oriented to time, oriented to place, oriented to person. No depression, no anxiety, no  agitation.  Gastrointestinal: No mass, no tenderness, no rigidity, non obese abdomen. No flank or suprapubic tenderness.  Musculoskeletal: Spine, ribs, pelvis no bilateral tenderness. Normal gait and station of head and neck.     PAST DATA REVIEWED:  Source Of History:  Patient  Records Review:   Previous Hospital Records, Previous Patient Records  Urine Test Review:   Urinalysis   08/09/16  Urinalysis  Urine Appearance Cloudy   Urine Color Yellow    Urine Glucose Neg   Urine Bilirubin Neg   Urine Ketones Neg   Urine Specific Gravity 1.020   Urine Blood Trace   Urine pH 5.5   Urine Protein 2+   Urine Urobilinogen 0.2   Urine Nitrites Neg   Urine Leukocyte Esterase 1+   Urine WBC/hpf >60/hpf   Urine RBC/hpf NS (Not Seen)   Urine Epithelial Cells 0 - 5/hpf   Urine Bacteria Rare (0-9/hpf)   Urine Mucous Not Present   Urine Yeast NS (Not Seen)   Urine Trichomonas Not Present   Urine Cystals NS (Not Seen)   Urine Casts NS (Not Seen)   Urine Sperm Not Present    PROCEDURES:          Urinalysis w/Scope Dipstick Dipstick Cont'd Micro  Color: Yellow Bilirubin: Neg WBC/hpf: >60/hpf  Appearance: Cloudy Ketones: Neg RBC/hpf: NS (Not Seen)  Specific Gravity: 1.020 Blood: Trace Bacteria: Rare (0-9/hpf)  pH: 5.5 Protein: 2+ Cystals: NS (Not Seen)  Glucose: Neg Urobilinogen: 0.2 Casts: NS (Not Seen)    Nitrites: Neg Trichomonas: Not Present    Leukocyte Esterase: 1+ Mucous: Not Present      Epithelial Cells: 0 - 5/hpf      Yeast: NS (Not Seen)      Sperm: Not Present    ASSESSMENT:      ICD-10 Details  1 GU:   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 Urine for c/s today. Plan to set pt up for right ureteral stent exchange in the coming weeks.

## 2016-09-17 NOTE — Anesthesia Preprocedure Evaluation (Addendum)
Anesthesia Evaluation  Patient identified by MRN, date of birth, ID band Patient awake    Reviewed: Allergy & Precautions, H&P , NPO status , Patient's Chart, lab work & pertinent test results, reviewed documented beta blocker date and time   Airway Mallampati: II  TM Distance: >3 FB Neck ROM: Full    Dental no notable dental hx. (+) Poor Dentition, Dental Advisory Given   Pulmonary neg pulmonary ROS, former smoker,    Pulmonary exam normal breath sounds clear to auscultation       Cardiovascular hypertension, Pt. on medications and Pt. on home beta blockers + CAD, + CABG, + Peripheral Vascular Disease and +CHF  negative cardio ROS   Rhythm:Regular Rate:Normal     Neuro/Psych negative neurological ROS  negative psych ROS   GI/Hepatic Neg liver ROS, GERD  Medicated and Controlled,  Endo/Other  negative endocrine ROSdiabetes, Insulin DependentHypothyroidism   Renal/GU Renal InsufficiencyRenal diseasenegative Renal ROS  negative genitourinary   Musculoskeletal  (+) Arthritis , Osteoarthritis,    Abdominal   Peds  Hematology negative hematology ROS (+) anemia ,   Anesthesia Other Findings   Reproductive/Obstetrics negative OB ROS                            Anesthesia Physical Anesthesia Plan  ASA: III  Anesthesia Plan: General   Post-op Pain Management:    Induction: Intravenous  PONV Risk Score and Plan: 4 or greater and Ondansetron, Dexamethasone and Treatment may vary due to age or medical condition  Airway Management Planned: LMA  Additional Equipment:   Intra-op Plan:   Post-operative Plan: Extubation in OR  Informed Consent: I have reviewed the patients History and Physical, chart, labs and discussed the procedure including the risks, benefits and alternatives for the proposed anesthesia with the patient or authorized representative who has indicated his/her understanding  and acceptance.   Dental advisory given  Plan Discussed with: CRNA  Anesthesia Plan Comments:        Anesthesia Quick Evaluation

## 2016-09-17 NOTE — Anesthesia Postprocedure Evaluation (Signed)
Anesthesia Post Note  Patient: Avira Tillison [place]  Procedure(s) Performed: Procedure(s) (LRB): CYSTOSCOPY WITH RIGHT  RETROGRADE PYELOGRAM RIGHT URETERAL STENT EXCHANGE (Right)     Patient location during evaluation: PACU Anesthesia Type: General Level of consciousness: awake and alert Pain management: pain level controlled Vital Signs Assessment: post-procedure vital signs reviewed and stable Respiratory status: spontaneous breathing, nonlabored ventilation, respiratory function stable and patient connected to nasal cannula oxygen Cardiovascular status: blood pressure returned to baseline and stable Postop Assessment: no signs of nausea or vomiting Anesthetic complications: no    Last Vitals:  Vitals:   09/17/16 1520 09/17/16 1530  BP: (!) 181/68 (!) 184/66  Pulse: (!) 54 (!) 51  Resp: 16 15  Temp: (!) 36.3 C     Last Pain:  Vitals:   09/17/16 1530  TempSrc:   PainSc: 0-No pain                 Coulton Schlink,W. EDMOND

## 2016-09-22 ENCOUNTER — Telehealth: Payer: Self-pay | Admitting: Endocrinology

## 2016-09-22 ENCOUNTER — Other Ambulatory Visit: Payer: Self-pay

## 2016-09-22 MED ORDER — HYDROCODONE-ACETAMINOPHEN 5-325 MG PO TABS: ORAL_TABLET | ORAL | 0 refills | Status: DC

## 2016-09-22 NOTE — Telephone Encounter (Signed)
**  Remind patient they can make refill requests via MyChart**  Medication refill request (Name & Dosage):  Hydrocodone-acetaminophen (NORCO/VICODIN) 5-325 MG TAB  Preferred pharmacy (Name & Address):     Other comments (if applicable): Wants to be able to pick it up 9:15am during lab visit tomorrow 09/23/2016.

## 2016-09-22 NOTE — Telephone Encounter (Signed)
This has been printed, put on table waiting for Dr. Dwyane Dee to sign

## 2016-09-22 NOTE — Telephone Encounter (Signed)
Prescription is at the front desk in the accordion file already signed

## 2016-09-23 ENCOUNTER — Other Ambulatory Visit (INDEPENDENT_AMBULATORY_CARE_PROVIDER_SITE_OTHER): Payer: Medicare Other

## 2016-09-23 DIAGNOSIS — D649 Anemia, unspecified: Secondary | ICD-10-CM

## 2016-09-23 DIAGNOSIS — E119 Type 2 diabetes mellitus without complications: Secondary | ICD-10-CM

## 2016-09-23 DIAGNOSIS — E063 Autoimmune thyroiditis: Secondary | ICD-10-CM | POA: Diagnosis not present

## 2016-09-23 LAB — LDL CHOLESTEROL, DIRECT: LDL DIRECT: 55 mg/dL

## 2016-09-23 LAB — LIPID PANEL
CHOL/HDL RATIO: 4
CHOLESTEROL: 129 mg/dL (ref 0–200)
HDL: 31.4 mg/dL — ABNORMAL LOW (ref >39.00)
NonHDL: 97.68
TRIGLYCERIDES: 326 mg/dL — AB (ref 0.0–149.0)
VLDL: 65.2 mg/dL — ABNORMAL HIGH (ref 0.0–40.0)

## 2016-09-23 LAB — CBC
HEMATOCRIT: 35.6 % — AB (ref 36.0–46.0)
HEMOGLOBIN: 11.6 g/dL — AB (ref 12.0–15.0)
MCHC: 32.6 g/dL (ref 30.0–36.0)
MCV: 89.4 fl (ref 78.0–100.0)
Platelets: 205 10*3/uL (ref 150.0–400.0)
RBC: 3.98 Mil/uL (ref 3.87–5.11)
RDW: 14.5 % (ref 11.5–15.5)
WBC: 6.6 10*3/uL (ref 4.0–10.5)

## 2016-09-23 LAB — T4, FREE: FREE T4: 1.44 ng/dL (ref 0.60–1.60)

## 2016-09-23 LAB — COMPREHENSIVE METABOLIC PANEL
ALBUMIN: 3.4 g/dL — AB (ref 3.5–5.2)
ALK PHOS: 56 U/L (ref 39–117)
ALT: 9 U/L (ref 0–35)
AST: 14 U/L (ref 0–37)
BILIRUBIN TOTAL: 0.3 mg/dL (ref 0.2–1.2)
BUN: 33 mg/dL — AB (ref 6–23)
CALCIUM: 9.2 mg/dL (ref 8.4–10.5)
CHLORIDE: 105 meq/L (ref 96–112)
CO2: 28 mEq/L (ref 19–32)
CREATININE: 1.59 mg/dL — AB (ref 0.40–1.20)
GFR: 33.6 mL/min — ABNORMAL LOW (ref >60.00)
Glucose, Bld: 127 mg/dL — ABNORMAL HIGH (ref 70–99)
Potassium: 3.6 mEq/L (ref 3.5–5.1)
SODIUM: 142 meq/L (ref 135–145)
TOTAL PROTEIN: 6.2 g/dL (ref 6.0–8.3)

## 2016-09-23 LAB — TSH: TSH: 0.64 u[IU]/mL (ref 0.35–4.50)

## 2016-09-23 LAB — HEMOGLOBIN A1C: Hgb A1c MFr Bld: 6 % (ref 4.6–6.5)

## 2016-09-28 ENCOUNTER — Ambulatory Visit (INDEPENDENT_AMBULATORY_CARE_PROVIDER_SITE_OTHER): Payer: Medicare Other | Admitting: Physician Assistant

## 2016-09-28 ENCOUNTER — Encounter: Payer: Self-pay | Admitting: Physician Assistant

## 2016-09-28 VITALS — BP 150/72 | HR 66 | Ht 66.0 in | Wt 147.0 lb

## 2016-09-28 DIAGNOSIS — N183 Chronic kidney disease, stage 3 unspecified: Secondary | ICD-10-CM

## 2016-09-28 DIAGNOSIS — I25810 Atherosclerosis of coronary artery bypass graft(s) without angina pectoris: Secondary | ICD-10-CM | POA: Diagnosis not present

## 2016-09-28 DIAGNOSIS — I714 Abdominal aortic aneurysm, without rupture, unspecified: Secondary | ICD-10-CM

## 2016-09-28 DIAGNOSIS — I4892 Unspecified atrial flutter: Secondary | ICD-10-CM

## 2016-09-28 DIAGNOSIS — I10 Essential (primary) hypertension: Secondary | ICD-10-CM | POA: Diagnosis not present

## 2016-09-28 DIAGNOSIS — I739 Peripheral vascular disease, unspecified: Secondary | ICD-10-CM

## 2016-09-28 DIAGNOSIS — E119 Type 2 diabetes mellitus without complications: Secondary | ICD-10-CM

## 2016-09-28 DIAGNOSIS — E785 Hyperlipidemia, unspecified: Secondary | ICD-10-CM | POA: Diagnosis not present

## 2016-09-28 DIAGNOSIS — I779 Disorder of arteries and arterioles, unspecified: Secondary | ICD-10-CM | POA: Diagnosis not present

## 2016-09-28 DIAGNOSIS — Z794 Long term (current) use of insulin: Secondary | ICD-10-CM | POA: Diagnosis not present

## 2016-09-28 DIAGNOSIS — E039 Hypothyroidism, unspecified: Secondary | ICD-10-CM

## 2016-09-28 MED ORDER — OMEGA-3-ACID ETHYL ESTERS 1 G PO CAPS: 1.0000 g | ORAL_CAPSULE | Freq: Two times a day (BID) | ORAL | 11 refills | Status: DC

## 2016-09-28 NOTE — Patient Instructions (Signed)
Medication Instructions:  Continue current medications  Labwork: None Ordered  Testing/Procedures: Your physician has requested that you have a lower extremity arterial duplex. This test is an ultrasound of the arteries in the legs. It looks at arterial blood flow in the legs. Allow one hour for Lower Arterial scans. There are no restrictions or special instructions Your physician has requested that you have an ankle brachial index (ABI). During this test an ultrasound and blood pressure cuff are used to evaluate the arteries that supply the arms and legs with blood. Allow thirty minutes for this exam. There are no restrictions or special instructions.  Follow-Up: Your physician wants you to follow-up in: 6 Months with Dr Gwenlyn Found. You will receive a reminder letter in the mail two months in advance. If you don't receive a letter, please call our office to schedule the follow-up appointment.   Any Other Special Instructions Will Be Listed Below (If Applicable).   If you need a refill on your cardiac medications before your next appointment, please call your pharmacy.

## 2016-09-28 NOTE — Progress Notes (Signed)
Cardiology Office Note    Date:  09/29/2016   ID:  [name] [name], DOB 08-01-1940, MRN [id number]  PCP:  [name], MD  Cardiologist:  Dr. Gwenlyn Found   Chief Complaint  Patient presents with  . Follow-up    seen for Dr. Gwenlyn Found.    History of Present Illness:  [name] [name] is a 76 y.o. female with PMH of carotid artery stenosis s/p bilateral CEA, AAA, CHF, CKD stage III, CAD s/p CABG x 6, GERD, HTN, HLD, DM II, atrial flutter, hypothyroidism and PAD. She had a history of aortobifemoral bypass and right femoropopliteal bypass graft with lifestyle limiting claudication. Cardiac catheterization on 12/09/2009 revealed occluded vein to OM and ramus branches with patent vein graft to PDA and patent LIMA to LAD. EF was 40% time. She quit smoking in 2011. Echocardiogram obtained in January 2012 showed normal LV systolic function. Myoview obtained on 11/24/2011 was nonischemic. Last lower extremity Doppler obtained on 10/05/2011 revealed right ABI 0.45, occluded femoropopliteal bypass graft and left ABI of 0.59 with occluded left SFA. Her ejection fraction decreased down to 20-25% during her admission in November 2017 where she was treated for acute congestive heart failure and atrial flutter. She was elected not to anticoagulate because of her history of GI bleed nor to pursue cardiac catheterization. She was seen by Dr. Gwenlyn Found again in January 2018. Repeat echocardiogram obtained on 04/15/2016 showed EF 56-38%, grade 1 diastolic dysfunction. Last carotid ultrasound obtained on 07/02/2016 showed a chronically occluded right ICA and ECA, 60-79% stenosis, one year follow-up was recommended.  She has been doing well since the last year. Her carotid artery symptoms to be stable despite significant amount of carotid artery disease. She has been having almost nightly episodes of leg cramps in the past 6 month alone, there were at least 2 times where her legs "fell asleep". It is worse on the right lower extremity  compared to the left. Interestingly enough, when she ambulate with her cane, she denies any significant claudication symptoms. I will obtain a bilateral lower extremity arterial ultrasound with ABI. Otherwise, recent lab work shows her triglyceride is 320, I added Lovaza 1 g twice a day to her medical regiment. She can follow-up in 6 month.    Past Medical History:  Diagnosis Date  . AAA (abdominal aortic aneurysm) ([place])   . Bilateral hydronephrosis   . Carotid artery stenosis    carotid doppler 06/2012 - Right CCA/Bulb/ICA with chronic occlusion; L vertebral artery with abnormal blood flow; L Bulb/Prox ICA  s/p endarterectomy with mild fibrous plaque, 50% diameter reduction  . CHF (congestive heart failure) ([place])   . Choledocholithiasis 2017  . CKD (chronic kidney disease), stage III   . Coronary artery disease due to lipid rich plaque cardiologist-  dr berry   s/p CABG x6 1997-- cath 12-09-2009 occluded vein to obtuse marginal branch and ramus branch with patent vien to PDA and patent LIMA to LAD, ef 40%-- Myoview 11-24-2011, nonischemic  . Diverticulosis   . Dyspnea on exertion   . GERD (gastroesophageal reflux disease)   . GI bleed   . History of sepsis    10-18-2014 w/ acute pyelonephritis  . Hyperlipidemia   . Hypertension   . Hypothyroidism   . Ischemic cardiomyopathy    03-25-2010-- per lasts echo EF  50-55%  . PAD (peripheral artery disease) ([place])    09/2010 LEAs - R ABI of 0.45, occluded fem-pop bypass graft, L ABI of 0.59 with occluded SFA; severe  arterial insuff  . PVD (peripheral vascular disease) with claudication ([place])    last duplex 07-04-2015 -- Right CCA and ICA chronic occlusion, 06-30% LICA, Patent vertebral arteries w/ antegrade flow, bilateral normal subclavian arteries   . Retroperitoneal fibrosis   . Tubular adenoma of colon   . Type 2 diabetes mellitus ([place])    monitored by dr Dwyane Dee    Past Surgical History:  Procedure Laterality Date  . AORTA - BILATERAL  FEMORAL ARTERY BYPASS GRAFT  1997   and RIGHT FEM-POP   . CARDIAC CATHETERIZATION  12-09-2009  dr al little   EF >40%-- occluded vein to OM & ramus branches, patent vein to PDA, patent LIMA to LAD (Dr. [name], [facility]) - later had thrombectomy of R fem-pop bypass ad R common femoral & profunda femoris artery (Dr. Oneida Alar)  . CARDIOVASCULAR STRESS TEST  11-24-2011   dr berry   Low Risk study: fixed basal to mid inferior attenuation artifact, no reversible ischemia,  normal LV function and wall motion , ef 67%  . CAROTID ENDARTERECTOMY Bilateral right 1994//  left ?  Marland Kitchen CATARACT EXTRACTION W/ INTRAOCULAR LENS  IMPLANT, BILATERAL  2006  . CHOLECYSTECTOMY N/A 09/05/2015   Procedure: ATTEMPTED LAPAROSCOPIC CHOLECYSTECTOMY, EXPLORATORY LAPAROTOMY WITH CHOLECYSTECTOMY;  Surgeon: Autumn Messing III, MD;  Location: [facility];  Service: General;  Laterality: N/A;  . COLONOSCOPY    . COLONOSCOPY  10/21/2015  . COLONOSCOPY WITH PROPOFOL N/A 10/21/2015   Procedure: COLONOSCOPY WITH PROPOFOL;  Surgeon: Milus Banister, MD;  Location: [facility];  Service: Endoscopy;  Laterality: N/A;  . CORONARY ARTERY BYPASS GRAFT  1997   x6; internal mammary to LAD, SVG to ramus #1 & #2, SVG to OM, SVG to PDA,   . CYSTOSCOPY W/ RETROGRADES Right 08/01/2015   Procedure: CYSTOSCOPY WITH RETROGRADE PYELOGRAM;  Surgeon: Ardis Hughs, MD;  Location[facility];  Service: Urology;  Laterality: Right;  . CYSTOSCOPY W/ URETERAL STENT PLACEMENT  03/10/2012   Procedure: CYSTOSCOPY WITH RETROGRADE PYELOGRAM/URETERAL STENT PLACEMENT;  Surgeon: Hanley Ben, MD;  Location: [facility];  Service: Urology;  Laterality: Left;  . CYSTOSCOPY W/ URETERAL STENT PLACEMENT Bilateral 03/07/2015   Procedure: BILATERAL RETROGRADE PYELOGRAM AND RIGHT URETERAL STENT PLACEMENT;  Surgeon: Ardis Hughs, MD;  Location: [facility];  Service: Urology;  Laterality: Bilateral;  . CYSTOSCOPY W/ URETERAL STENT PLACEMENT Right  08/01/2015   Procedure: CYSTOSCOPY WITH STENT REPLACEMENT;  Surgeon: Ardis Hughs, MD;  Location: [facility];  Service: Urology;  Laterality: Right;  . CYSTOSCOPY W/ URETERAL STENT PLACEMENT Right 02/13/2016   Procedure: RIGHT URETERAL STENT EXCHANGE;  Surgeon: Ardis Hughs, MD;  Location: [facility];  Service: Urology;  Laterality: Right;  . CYSTOSCOPY W/ URETERAL STENT PLACEMENT Right 09/17/2016   Procedure: CYSTOSCOPY WITH RIGHT  RETROGRADE PYELOGRAM RIGHT URETERAL STENT EXCHANGE;  Surgeon: Ardis Hughs, MD;  Location: [facility];  Service: Urology;  Laterality: Right;  . ERCP N/A 09/03/2015   Procedure: ENDOSCOPIC RETROGRADE CHOLANGIOPANCREATOGRAPHY (ERCP);  Surgeon: Irene Shipper, MD;  Location: [facility];  Service: Endoscopy;  Laterality: N/A;  . ESOPHAGOGASTRODUODENOSCOPY    . ESOPHAGOGASTRODUODENOSCOPY N/A 10/19/2015   Procedure: ESOPHAGOGASTRODUODENOSCOPY (EGD);  Surgeon: Gatha Mayer, MD;  Location: [facility]COPY;  Service: Endoscopy;  Laterality: N/A;  . GIVENS CAPSULE STUDY  10/21/2015  . GIVENS CAPSULE STUDY N/A 10/21/2015   Procedure: GIVENS CAPSULE STUDY;  Surgeon: Milus Banister, MD;  Location: [facility];  Service: Endoscopy;  Laterality: N/A;  .  REPAIR RIGHT FEMORAL PSEUDOANEUYSM/  RIGHT FEM-POP BYPASS GRAFT/  DEBRIDEMENT RIGHT LOWER EXTREMITIY VENOUS STATUS ULCERS X2  01-12-2005  . TOTAL ABDOMINAL HYSTERECTOMY W/ BILATERAL SALPINGOOPHORECTOMY  1986  . TRANSTHORACIC ECHOCARDIOGRAM  03/25/2010   EF 54-00%, LV systolic function low normal with mild inferoseptal hypocontractility; LA mildly dilated; mod MR; mild TR, RV systolic pressure elevated, mild pulm HTN; AV mildly sclerotic; mild pulm valve regurg; aortic root sclerosis/calcif     Current Medications: Outpatient Medications Prior to Visit  Medication Sig Dispense Refill  . aspirin 81 MG chewable tablet Chew 81 mg by mouth daily.     . calcium-vitamin D (OSCAL WITH D) 500-200 MG-UNIT per tablet  Take 1 tablet by mouth every morning.    . Cranberry 250 MG TABS Take 250 mg by mouth daily.     . ferrous sulfate 325 (65 FE) MG tablet Take 650 mg by mouth every evening.     . furosemide (LASIX) 20 MG tablet Take 20 mg by mouth every other day.    Marland Kitchen glucose blood (ONE TOUCH ULTRA TEST) test strip Use to test blood sugar 1-2 times daily Dx code- E11.9 100 each 4  . hydrALAZINE (APRESOLINE) 10 MG tablet Take 1 tablet (10 mg total) by mouth every 8 (eight) hours. 90 tablet 5  . HYDROcodone-acetaminophen (NORCO/VICODIN) 5-325 MG tablet Take 1 tablet every 12 hours as needed for back pain 60 tablet 0  . Insulin Pen Needle (PEN NEEDLES) 31G X 6 MM MISC Use to inject insulin 4 times per day. 150 each 2  . isosorbide mononitrate (IMDUR) 30 MG 24 hr tablet take 1 tablet by mouth once daily 30 tablet 4  . latanoprost (XALATAN) 0.005 % ophthalmic solution Place 1 drop into both eyes at bedtime.  0  . metoprolol succinate (TOPROL-XL) 25 MG 24 hr tablet Take 1 tablet (25 mg total) by mouth daily. 90 tablet 1  . omeprazole (PRILOSEC) 20 MG capsule TAKE 1 CAPSULE DAILY 90 capsule 3  . phenazopyridine (PYRIDIUM) 200 MG tablet Take 1 tablet (200 mg total) by mouth 3 (three) times daily as needed for pain. 10 tablet 0  . polyethylene glycol (MIRALAX / GLYCOLAX) packet Take 17 g by mouth daily as needed for moderate constipation. 14 each 0  . rosuvastatin (CRESTOR) 20 MG tablet Take 1 tablet (20 mg total) by mouth daily. Take 1 tablet daily 90 tablet 2  . VENTOLIN HFA 108 (90 Base) MCG/ACT inhaler inhale 2 puffs by mouth INTO THE LUNGS every 6 hours if needed for wheezing 18 Inhaler 4  . vitamin B-12 (CYANOCOBALAMIN) 1000 MCG tablet Take 1,000 mcg by mouth daily.    . vitamin C (ASCORBIC ACID) 500 MG tablet Take 500 mg by mouth at bedtime.     . Vitamin D, Ergocalciferol, (DRISDOL) 50000 units CAPS capsule TAKE 1 CAPSULE BY MOUTH EVERY 14 DAYS 8 capsule 3  . insulin aspart (NOVOLOG) 100 UNIT/ML FlexPen Inject 8  units three times a day with meals (Patient taking differently: Inject 6 Units into the skin 3 (three) times daily with meals. ) 15 mL 5  . LEVEMIR FLEXTOUCH 100 UNIT/ML Pen INJECT 22 UNITS AT BEDTIME (Patient taking differently: INJECT 14 UNITS AT BEDTIME) 30 mL 1  . levothyroxine (SYNTHROID, LEVOTHROID) 125 MCG tablet TAKE 1 TABLET DAILY (Patient taking differently: TAKE 1 TABLET DAILY, Does not take on Saturday) 90 tablet 3   No facility-administered medications prior to visit.      Allergies:   Patient has  no known allergies.   Social History   Social History  . Marital status: Divorced    Spouse name: N/A  . Number of children: 1  . Years of education: 69   Social History Main Topics  . Smoking status: Former Smoker    Packs/day: 2.00    Years: 30.00    Quit date: 06/07/2009  . Smokeless tobacco: Never Used  . Alcohol use 1.8 oz/week    3 Standard drinks or equivalent per week     Comment: ocassional  . Drug use: No  . Sexual activity: Not Asked   Other Topics Concern  . None   Social History Narrative   Lives alone   Worked 17 yrs [employer]   1 daughter     Family History:  The patient's family history includes Cancer in her maternal aunt; Congestive Heart Failure in her mother; Diabetes in her mother; Stroke in her father.   ROS:   Please see the history of present illness.    ROS All other systems reviewed and are negative.   PHYSICAL EXAM:   VS:  BP (!) 150/72 (BP Location: Left Arm, Cuff Size: Normal)   Pulse 66   Ht 5\' 6"  (1.676 m)   Wt 147 lb (66.7 kg)   BMI 23.73 kg/m    GEN: Well nourished, well developed, in no acute distress  HEENT: normal  Neck: no JVD, carotid bruits, or masses Cardiac: RRR; no murmurs, rubs, or gallops,no edema  Respiratory:  clear to auscultation bilaterally, normal work of breathing GI: soft, nontender, nondistended, + BS MS: no deformity or atrophy  Skin: warm and dry, no rash Neuro:  Alert and Oriented x 3,  Strength and sensation are intact Psych: euthymic mood, full affect  Wt Readings from Last 3 Encounters:  09/29/16 145 lb 9.6 oz (66 kg)  09/28/16 147 lb (66.7 kg)  09/17/16 145 lb (65.8 kg)      Studies/Labs Reviewed:   EKG:  EKG is not ordered today.   Recent Labs: 01/23/2016: B Natriuretic Peptide 1,040.5 02/09/2016: Pro B Natriuretic peptide (BNP) 119.0 09/23/2016: ALT 9; BUN 33; Creatinine, Ser 1.59; Hemoglobin 11.6; Platelets 205.0; Potassium 3.6; Sodium 142; TSH 0.64   Lipid Panel    Component Value Date/Time   CHOL 129 09/23/2016 0932   TRIG 326.0 (H) 09/23/2016 0932   HDL 31.40 (L) 09/23/2016 0932   CHOLHDL 4 09/23/2016 0932   VLDL 65.2 (H) 09/23/2016 0932   LDLCALC 60 05/21/2016 0849   LDLDIRECT 55.0 09/23/2016 0932    Additional studies/ records that were reviewed today include:    Myoview 11/24/2011    Echo 04/15/2016 LV EF: 40% -   45%  Study Conclusions  - Left ventricle: The cavity size was mildly dilated. Wall   thickness was normal. Systolic function was mildly to moderately   reduced. The estimated ejection fraction was in the range of 40%   to 45%. Diffuse hypokinesis. Doppler parameters are consistent   with abnormal left ventricular relaxation (grade 1 diastolic   dysfunction). Doppler parameters are consistent with high   ventricular filling pressure. - Mitral valve: Calcified annulus. There was mild regurgitation. - Left atrium: The atrium was mildly dilated.  Impressions:  - Mild to moderate global reduction in LV systolic function; grade   1 diastolic dysfunction with elevated LV filling pressure; mild   LVE; mild MR; mild LAE.    Carotid US 07/02/2016 Heterogeneous plaque, with shadowing, bilaterally. Chronic occlusion in the right  CCA and ICA. Essentially stable 21-30% LICA stenosis. Normal velocity flow in the right subclavian artery; elevated velocities in the left. Patent vertebral arteries with antegrade flow on the right  and to and fro flow on the left.  ASSESSMENT:    1. PAD (peripheral artery disease) ([facility])   2. Claudication in peripheral vascular disease ([place])   3. Bilateral carotid artery disease ([place])   4. Abdominal aortic aneurysm (AAA) without rupture ([place])   5. CKD (chronic kidney disease), stage III   6. Coronary artery disease involving coronary bypass graft of native heart without angina pectoris   7. Essential hypertension   8. Hyperlipidemia, unspecified hyperlipidemia type   9. Controlled type 2 diabetes mellitus without complication, with long-term current use of insulin ([place])   10. Hypothyroidism, unspecified type   11. Atrial flutter, unspecified type ([place])      PLAN:  In order of problems listed above:  1. PAD: She is having almost nightly episodes of leg cramps, unclear if related to peripheral arterial disease. Interestingly enough, she may not feel the symptom when she ambulate with her cane. It is worse on the right side compared to the left side. He has a history of known severe coronary artery disease, I will repeat a lower extremity Doppler with ABI  2. CAD s/p CABG: No obvious angina.  3. DM 2: On insulin  4. Hypertension: Her blood pressure is elevated today, however normally her blood pressure is in the 120s at home. Would not treat a single elevated blood pressure.  5. Hyperlipidemia: Triglyceride quite elevated, will add Lovenox 1 g twice a day  6. Hypothyroidism: On Synthroid  7. History of atrial flutter: Recent EKG shows she is in sinus rhythm, this is consistent with my physical exam is well. She is currently not on any systemic anticoagulation due to risk for fall.  8. CKD stage III: Recent lab work shows stable renal function    Medication Adjustments/Labs and Tests Ordered: Current medicines are reviewed at length with the patient today.  Concerns regarding medicines are outlined above.  Medication changes, Labs and Tests ordered today are listed in the  Patient Instructions below. Patient Instructions  Medication Instructions:  Continue current medications  Labwork: None Ordered  Testing/Procedures: Your physician has requested that you have a lower extremity arterial duplex. This test is an ultrasound of the arteries in the legs. It looks at arterial blood flow in the legs. Allow one hour for Lower Arterial scans. There are no restrictions or special instructions Your physician has requested that you have an ankle brachial index (ABI). During this test an ultrasound and blood pressure cuff are used to evaluate the arteries that supply the arms and legs with blood. Allow thirty minutes for this exam. There are no restrictions or special instructions.  Follow-Up: Your physician wants you to follow-up in: 6 Months with Dr Gwenlyn Found. You will receive a reminder letter in the mail two months in advance. If you don't receive a letter, please call our office to schedule the follow-up appointment.   Any Other Special Instructions Will Be Listed Below (If Applicable).   If you need a refill on your cardiac medications before your next appointment, please call your pharmacy.      Hilbert Corrigan, [place]  09/29/2016 11:30 PM    [address], [place], [place]  [address] Phone: [phone]; Fax: [phone]

## 2016-09-29 ENCOUNTER — Encounter: Payer: Self-pay | Admitting: Endocrinology

## 2016-09-29 ENCOUNTER — Encounter: Payer: Self-pay | Admitting: Physician Assistant

## 2016-09-29 ENCOUNTER — Ambulatory Visit (INDEPENDENT_AMBULATORY_CARE_PROVIDER_SITE_OTHER): Payer: Medicare Other | Admitting: Endocrinology

## 2016-09-29 VITALS — BP 124/72 | HR 64 | Ht 66.0 in | Wt 145.6 lb

## 2016-09-29 DIAGNOSIS — E063 Autoimmune thyroiditis: Secondary | ICD-10-CM

## 2016-09-29 DIAGNOSIS — N289 Disorder of kidney and ureter, unspecified: Secondary | ICD-10-CM | POA: Diagnosis not present

## 2016-09-29 DIAGNOSIS — D649 Anemia, unspecified: Secondary | ICD-10-CM | POA: Diagnosis not present

## 2016-09-29 DIAGNOSIS — E1165 Type 2 diabetes mellitus with hyperglycemia: Secondary | ICD-10-CM | POA: Diagnosis not present

## 2016-09-29 DIAGNOSIS — E782 Mixed hyperlipidemia: Secondary | ICD-10-CM | POA: Diagnosis not present

## 2016-09-29 DIAGNOSIS — Z794 Long term (current) use of insulin: Secondary | ICD-10-CM | POA: Diagnosis not present

## 2016-09-29 NOTE — Progress Notes (Signed)
Patient ID: [name] [name], female   DOB: 1940/11/28, 76 y.o.   MRN: [id number]   Reason for Appointment: Follow-up of various problems  History of Present Illness    RENAL dysfunction:  Renal function is variable, recently appears to be slightly worse Does not have diabetic nephropathy Currently not on antihypertensives except 25 mg metoprolol No recurrence of abnormal potassium levels  Lab Results  Component Value Date   CREATININE 1.59 (H) 09/23/2016   CREATININE 1.49 (H) 09/09/2016   CREATININE 1.47 (H) 07/26/2016   CREATININE 1.34 (H) 05/21/2016   Lab Results  Component Value Date   CREATININE 1.59 (H) 09/23/2016   BUN 33 (H) 09/23/2016   NA 142 09/23/2016   K 3.6 09/23/2016   CL 105 09/23/2016   CO2 28 09/23/2016      CHF: She was admitted with shortness of breath and orthopnea on 01/23/16 She has a low ejection fraction of 20-25 % and is  on hydralazine 2 times a day along with 20 mg Lasix every second day Also she had atrial flutter which has resolved   She feels fairly good without dyspnea, has seen cardiologist recently   Type 2 DIABETES mellitus, date of diagnosis: 1986.   The insulin regimen is: Levemir 14 units hs, Novolog 6 ac twice a day   Type 2 diabetes  has been treated in the last few years with low dose basal bolus insulin regimen. She has not been taking  any oral hypoglycemic drugs including metformin partly because of GI side effects  A1c in March was 6.3 And is now 6%  Current management and problems:  Even with reducing her mealtime dose by 2 units her blood sugars are frequently low normal after evening meal with one episode of hypoglycemia also  She is having somewhat variable readings after meals but not consistently  Also has minimally higher fasting readings with reducing her Levemir by 2 units previously  Her weight and appetite are about the same, she thinks she is eating small portions anyway  She is not able to  exercise   Side effects from medications: Diarrhea from metformin and nausea and vomiting from GLP-1 drugs   Monitors blood glucose: Less than 1 times a day  Glucometer: One Touch.   Mean values apply above for all meters except median for One Touch  PRE-MEAL Fasting Midday  Dinner Bedtime Overall  Glucose range:  96-1 58       Mean/median: 120  133    131    POST-MEAL PC Breakfast PC Lunch PC Dinner  Glucose range:  114-164   58-1 78   Mean/median:  147  129     Meals: she is usually eating low fat meals especially recently meals usually at 11 AM,and supper at 5 pm.  Calorie intake: Usually controlled. Moderate Carbs Physical activity: exercise: Minimal  Dietician visit: Most recent:, 5/13.   Wt Readings from Last 3 Encounters:  09/29/16 145 lb 9.6 oz (66 kg)  09/28/16 147 lb (66.7 kg)  09/17/16 145 lb (65.8 kg)    Lab Results  Component Value Date   HGBA1C 6.0 09/23/2016   HGBA1C 5.9 (H) 09/09/2016   HGBA1C 6.3 05/21/2016   Lab Results  Component Value Date   MICROALBUR 24.7 (H) 02/02/2016   LDLCALC 60 05/21/2016   CREATININE 1.59 (H) 09/23/2016    ANEMIA:  Taking iron supplements, was told by gastroenterologist to take 2 pills and she takes these at bedtime  Lab Results  Component Value Date   WBC 6.6 09/23/2016   HGB 11.6 (L) 09/23/2016   HCT 35.6 (L) 09/23/2016   MCV 89.4 09/23/2016   PLT 205.0 09/23/2016      HYPOTHYROIDISM: She has had long-standing primary hypothyroidism She is taking Levothyroxine, 125 g dose, does take it regularly in the morningAnd the dose was reduced to 5-1/2 tablets per week on her last visit No unusual fatigue No recent weight change She is very regular with the medication, still is not finished with her 90 day supply  TSH is still low normal  Lab Results  Component Value Date   TSH 0.64 09/23/2016   TSH 0.39 07/26/2016   TSH 0.12 (L) 05/21/2016   FREET4 1.44 09/23/2016   FREET4 1.31 07/26/2016   FREET4 1.26  02/02/2016    OTHER active problems: See review of systems    Allergies as of 09/29/2016   No Known Allergies     Medication List       Accurate as of 09/29/16  1:53 PM. Always use your most recent med list.          aspirin 81 MG chewable tablet Chew 81 mg by mouth daily.   calcium-vitamin D 500-200 MG-UNIT tablet Commonly known as:  OSCAL WITH D Take 1 tablet by mouth every morning.   Cranberry 250 MG Tabs Take 250 mg by mouth daily.   ferrous sulfate 325 (65 FE) MG tablet Take 650 mg by mouth every evening.   furosemide 20 MG tablet Commonly known as:  LASIX Take 20 mg by mouth every other day.   glucose blood test strip Commonly known as:  ONE TOUCH ULTRA TEST Use to test blood sugar 1-2 times daily Dx code- E11.9   hydrALAZINE 10 MG tablet Commonly known as:  APRESOLINE Take 1 tablet (10 mg total) by mouth every 8 (eight) hours.   HYDROcodone-acetaminophen 5-325 MG tablet Commonly known as:  NORCO/VICODIN Take 1 tablet every 12 hours as needed for back pain   isosorbide mononitrate 30 MG 24 hr tablet Commonly known as:  IMDUR take 1 tablet by mouth once daily   latanoprost 0.005 % ophthalmic solution Commonly known as:  XALATAN Place 1 drop into both eyes at bedtime.   LEVEMIR 100 UNIT/ML injection Generic drug:  insulin detemir Inject 14 Units into the skin at bedtime.   levothyroxine 125 MCG tablet Commonly known as:  SYNTHROID, LEVOTHROID Take 125 mcg by mouth daily before breakfast. As directed   metoprolol succinate 25 MG 24 hr tablet Commonly known as:  TOPROL-XL Take 1 tablet (25 mg total) by mouth daily.   NOVOLOG FLEXPEN 100 UNIT/ML FlexPen Generic drug:  insulin aspart Inject 6 Units into the skin 3 (three) times daily with meals.   omega-3 acid ethyl esters 1 g capsule Commonly known as:  LOVAZA Take 1 capsule (1 g total) by mouth 2 (two) times daily.   omeprazole 20 MG capsule Commonly known as:  PRILOSEC TAKE 1 CAPSULE  DAILY   Pen Needles 31G X 6 MM Misc Use to inject insulin 4 times per day.   phenazopyridine 200 MG tablet Commonly known as:  PYRIDIUM Take 1 tablet (200 mg total) by mouth 3 (three) times daily as needed for pain.   polyethylene glycol packet Commonly known as:  MIRALAX / GLYCOLAX Take 17 g by mouth daily as needed for moderate constipation.   rosuvastatin 20 MG tablet Commonly known as:  CRESTOR Take 1 tablet (20 mg  total) by mouth daily. Take 1 tablet daily   VENTOLIN HFA 108 (90 Base) MCG/ACT inhaler Generic drug:  albuterol inhale 2 puffs by mouth INTO THE LUNGS every 6 hours if needed for wheezing   vitamin B-12 1000 MCG tablet Commonly known as:  CYANOCOBALAMIN Take 1,000 mcg by mouth daily.   vitamin C 500 MG tablet Commonly known as:  ASCORBIC ACID Take 500 mg by mouth at bedtime.   Vitamin D (Ergocalciferol) 50000 units Caps capsule Commonly known as:  DRISDOL TAKE 1 CAPSULE BY MOUTH EVERY 14 DAYS       Allergies: No Known Allergies  Past Medical History:  Diagnosis Date  . AAA (abdominal aortic aneurysm) ([place])   . Bilateral hydronephrosis   . Carotid artery stenosis    carotid doppler 06/2012 - Right CCA/Bulb/ICA with chronic occlusion; L vertebral artery with abnormal blood flow; L Bulb/Prox ICA  s/p endarterectomy with mild fibrous plaque, 50% diameter reduction  . CHF (congestive heart failure) ([place])   . Choledocholithiasis 2017  . CKD (chronic kidney disease), stage III   . Coronary artery disease due to lipid rich plaque cardiologist-  dr berry   s/p CABG x6 1997-- cath 12-09-2009 occluded vein to obtuse marginal branch and ramus branch with patent vien to PDA and patent LIMA to LAD, ef 40%-- Myoview 11-24-2011, nonischemic  . Diverticulosis   . Dyspnea on exertion   . GERD (gastroesophageal reflux disease)   . GI bleed   . History of sepsis    10-18-2014 w/ acute pyelonephritis  . Hyperlipidemia   . Hypertension   . Hypothyroidism   .  Ischemic cardiomyopathy    03-25-2010-- per lasts echo EF  50-55%  . PAD (peripheral artery disease) ([place])    09/2010 LEAs - R ABI of 0.45, occluded fem-pop bypass graft, L ABI of 0.59 with occluded SFA; severe arterial insuff  . PVD (peripheral vascular disease) with claudication ([place])    last duplex 07-04-2015 -- Right CCA and ICA chronic occlusion, 84-66% LICA, Patent vertebral arteries w/ antegrade flow, bilateral normal subclavian arteries   . Retroperitoneal fibrosis   . Tubular adenoma of colon   . Type 2 diabetes mellitus ([place])    monitored by dr Dwyane Dee    Past Surgical History:  Procedure Laterality Date  . AORTA - BILATERAL FEMORAL ARTERY BYPASS GRAFT  1997   and RIGHT FEM-POP   . CARDIAC CATHETERIZATION  12-09-2009  dr al little   EF >40%-- occluded vein to OM & ramus branches, patent vein to PDA, patent LIMA to LAD (Dr. [name], [facility]) - later had thrombectomy of R fem-pop bypass ad R common femoral & profunda femoris artery (Dr. Oneida Alar)  . CARDIOVASCULAR STRESS TEST  11-24-2011   dr berry   Low Risk study: fixed basal to mid inferior attenuation artifact, no reversible ischemia,  normal LV function and wall motion , ef 67%  . CAROTID ENDARTERECTOMY Bilateral right 1994//  left ?  Marland Kitchen CATARACT EXTRACTION W/ INTRAOCULAR LENS  IMPLANT, BILATERAL  2006  . CHOLECYSTECTOMY N/A 09/05/2015   Procedure: ATTEMPTED LAPAROSCOPIC CHOLECYSTECTOMY, EXPLORATORY LAPAROTOMY WITH CHOLECYSTECTOMY;  Surgeon: Autumn Messing III, MD;  Location: [facility];  Service: General;  Laterality: N/A;  . COLONOSCOPY    . COLONOSCOPY  10/21/2015  . COLONOSCOPY WITH PROPOFOL N/A 10/21/2015   Procedure: COLONOSCOPY WITH PROPOFOL;  Surgeon: Milus Banister, MD;  Location: [facility];  Service: Endoscopy;  Laterality: N/A;  . CORONARY ARTERY BYPASS GRAFT  1997   x6; internal  mammary to LAD, SVG to ramus #1 & #2, SVG to OM, SVG to PDA,   . CYSTOSCOPY W/ RETROGRADES Right 08/01/2015   Procedure: CYSTOSCOPY WITH RETROGRADE  PYELOGRAM;  Surgeon: Ardis Hughs, MD;  Location: [facility];  Service: Urology;  Laterality: Right;  . CYSTOSCOPY W/ URETERAL STENT PLACEMENT  03/10/2012   Procedure: CYSTOSCOPY WITH RETROGRADE PYELOGRAM/URETERAL STENT PLACEMENT;  Surgeon: Hanley Ben, MD;  Location: [facility];  Service: Urology;  Laterality: Left;  . CYSTOSCOPY W/ URETERAL STENT PLACEMENT Bilateral 03/07/2015   Procedure: BILATERAL RETROGRADE PYELOGRAM AND RIGHT URETERAL STENT PLACEMENT;  Surgeon: Ardis Hughs, MD;  Location: [facility];  Service: Urology;  Laterality: Bilateral;  . CYSTOSCOPY W/ URETERAL STENT PLACEMENT Right 08/01/2015   Procedure: CYSTOSCOPY WITH STENT REPLACEMENT;  Surgeon: Ardis Hughs, MD;  Location: [facility];  Service: Urology;  Laterality: Right;  . CYSTOSCOPY W/ URETERAL STENT PLACEMENT Right 02/13/2016   Procedure: RIGHT URETERAL STENT EXCHANGE;  Surgeon: Ardis Hughs, MD;  Location: [facility];  Service: Urology;  Laterality: Right;  . CYSTOSCOPY W/ URETERAL STENT PLACEMENT Right 09/17/2016   Procedure: CYSTOSCOPY WITH RIGHT  RETROGRADE PYELOGRAM RIGHT URETERAL STENT EXCHANGE;  Surgeon: Ardis Hughs, MD;  Location: [facility];  Service: Urology;  Laterality: Right;  . ERCP N/A 09/03/2015   Procedure: ENDOSCOPIC RETROGRADE CHOLANGIOPANCREATOGRAPHY (ERCP);  Surgeon: Irene Shipper, MD;  Location: [facility];  Service: Endoscopy;  Laterality: N/A;  . ESOPHAGOGASTRODUODENOSCOPY    . ESOPHAGOGASTRODUODENOSCOPY N/A 10/19/2015   Procedure: ESOPHAGOGASTRODUODENOSCOPY (EGD);  Surgeon: Gatha Mayer, MD;  Location: [facility];  Service: Endoscopy;  Laterality: N/A;  . GIVENS CAPSULE STUDY  10/21/2015  . GIVENS CAPSULE STUDY N/A 10/21/2015   Procedure: GIVENS CAPSULE STUDY;  Surgeon: Milus Banister, MD;  Location: [facility];  Service: Endoscopy;  Laterality: N/A;  . REPAIR RIGHT FEMORAL PSEUDOANEUYSM/  RIGHT FEM-POP BYPASS GRAFT/   DEBRIDEMENT RIGHT LOWER EXTREMITIY VENOUS STATUS ULCERS X2  01-12-2005  . TOTAL ABDOMINAL HYSTERECTOMY W/ BILATERAL SALPINGOOPHORECTOMY  1986  . TRANSTHORACIC ECHOCARDIOGRAM  03/25/2010   EF 35-57%, LV systolic function low normal with mild inferoseptal hypocontractility; LA mildly dilated; mod MR; mild TR, RV systolic pressure elevated, mild pulm HTN; AV mildly sclerotic; mild pulm valve regurg; aortic root sclerosis/calcif     Family History  Problem Relation Age of Onset  . Congestive Heart Failure Mother   . Diabetes Mother   . Stroke Father   . Cancer Maternal Aunt        Breast cancer    Social History:  reports that she quit smoking about 7 years ago. She has a 60.00 pack-year smoking history. She has never used smokeless tobacco. She reports that she drinks about 1.8 oz of alcohol per week . She reports that she does not use drugs.  Review of Systems -     Hyperlipidemia: Has history of high LDL and triglycerides/low HDL  Was changed from Lipitor to Crestor because of higher LDL, tolerating this well    LDL is Controlled Tends to have high triglycerides, not clear why her level is over 300, previously 167 She does not think she is eating larger amounts of carbohydrates or higher fat meals and no unusual food intake the night before Her cardiologist wants her to take Lovaza 1 g twice a day which she has not started yet   Lab Results  Component Value Date   CHOL 129 09/23/2016   HDL 31.40 (L) 09/23/2016  LDLCALC 60 05/21/2016   LDLDIRECT 55.0 09/23/2016   TRIG 326.0 (H) 09/23/2016   CHOLHDL 4 09/23/2016     Vitamin D deficiency: She has been on supplements and taking her 50,000 units every other week  Her level is normal this year  Still has on and off low back pain which is chronic    History of UTI:  Recently had a stent replaced, follows with urologist regularly  Examination:   BP 124/72   Pulse 64   Ht 5\' 6"  (1.676 m)   Wt 145 lb 9.6 oz (66 kg)   SpO2  97%   BMI 23.50 kg/m   Body mass index is 23.5 kg/m.        Assesment/PLAN:    RENAL insufficiency:  Likely to be from vascular insufficiency and glomerular sclerosis No history of diabetic nephropathy Creatinine again mildly increased and relatively stable  ANEMIA: Improved, likely related to chronic disease and renal insufficiency Continue taking iron and take it with food again  DIABETES: Blood sugars have  been mostly well-controlled, including after meals She does appear to have low normal readings fasting and occasionally after supper also Last A1c was 6.3  She will cut down her NovoLog by 2 units at dinnertime but continue other doses unchanged  HYPOTHYROIDISM: Her TSH is again low normal with taking 125 g 5-1/2 days a week She will take only 5 tablets per week and will change her next prescription to the 88 g dose  LIPIDS: Rule out clear why her triglycerides are much higher, her diet appears to be about the same Her cardiologist has started her on Lovaza twice a day and will recheck lipids in 3 months  Patient Instructions  Novolog 4 at supper only  NO thyroid 2/7 days  Total visit time for reviewing Recent records, labs, medications, blood sugar download, evaluation and management of multiple problems, counseling = 25 minutes   Franklin Baumbach  09/29/16    Note: This office note was prepared with Dragon voice recognition system technology. Any transcriptional errors that result from this process are unintentional.

## 2016-09-29 NOTE — Patient Instructions (Addendum)
Novolog 4 at supper only  NO thyroid 2/7 days

## 2016-10-06 ENCOUNTER — Other Ambulatory Visit (INDEPENDENT_AMBULATORY_CARE_PROVIDER_SITE_OTHER): Payer: Medicare Other

## 2016-10-06 DIAGNOSIS — D509 Iron deficiency anemia, unspecified: Secondary | ICD-10-CM | POA: Diagnosis not present

## 2016-10-06 LAB — CBC WITH DIFFERENTIAL/PLATELET
BASOS ABS: 0.1 10*3/uL (ref 0.0–0.1)
Basophils Relative: 0.5 % (ref 0.0–3.0)
EOS ABS: 0.2 10*3/uL (ref 0.0–0.7)
Eosinophils Relative: 2.1 % (ref 0.0–5.0)
HEMATOCRIT: 35.1 % — AB (ref 36.0–46.0)
Hemoglobin: 11.3 g/dL — ABNORMAL LOW (ref 12.0–15.0)
LYMPHS ABS: 1.3 10*3/uL (ref 0.7–4.0)
LYMPHS PCT: 11.2 % — AB (ref 12.0–46.0)
MCHC: 32.1 g/dL (ref 30.0–36.0)
MCV: 89.8 fl (ref 78.0–100.0)
MONOS PCT: 7.3 % (ref 3.0–12.0)
Monocytes Absolute: 0.8 10*3/uL (ref 0.1–1.0)
NEUTROS PCT: 78.9 % — AB (ref 43.0–77.0)
Neutro Abs: 8.8 10*3/uL — ABNORMAL HIGH (ref 1.4–7.7)
PLATELETS: 246 10*3/uL (ref 150.0–400.0)
RBC: 3.91 Mil/uL (ref 3.87–5.11)
RDW: 15 % (ref 11.5–15.5)
WBC: 11.1 10*3/uL — ABNORMAL HIGH (ref 4.0–10.5)

## 2016-10-06 LAB — IBC PANEL
Iron: 24 ug/dL — ABNORMAL LOW (ref 42–145)
SATURATION RATIOS: 9.6 % — AB (ref 20.0–50.0)
TRANSFERRIN: 179 mg/dL — AB (ref 212.0–360.0)

## 2016-10-06 LAB — FERRITIN: FERRITIN: 183.1 ng/mL (ref 10.0–291.0)

## 2016-10-10 ENCOUNTER — Other Ambulatory Visit: Payer: Self-pay | Admitting: Endocrinology

## 2016-10-19 ENCOUNTER — Ambulatory Visit (HOSPITAL_COMMUNITY)
Admission: RE | Admit: 2016-10-19 | Discharge: 2016-10-19 | Disposition: A | Discharge status: Home / Self Care | Payer: Medicare Other | Source: Ambulatory Visit | Attending: Cardiovascular Disease | Admitting: Cardiovascular Disease

## 2016-10-19 DIAGNOSIS — E1151 Type 2 diabetes mellitus with diabetic peripheral angiopathy without gangrene: Secondary | ICD-10-CM | POA: Diagnosis not present

## 2016-10-19 DIAGNOSIS — Z87891 Personal history of nicotine dependence: Secondary | ICD-10-CM | POA: Insufficient documentation

## 2016-10-19 DIAGNOSIS — Z95828 Presence of other vascular implants and grafts: Secondary | ICD-10-CM | POA: Insufficient documentation

## 2016-10-19 DIAGNOSIS — I739 Peripheral vascular disease, unspecified: Secondary | ICD-10-CM

## 2016-10-19 DIAGNOSIS — I2510 Atherosclerotic heart disease of native coronary artery without angina pectoris: Secondary | ICD-10-CM | POA: Diagnosis not present

## 2016-10-19 DIAGNOSIS — I10 Essential (primary) hypertension: Secondary | ICD-10-CM | POA: Diagnosis not present

## 2016-10-19 DIAGNOSIS — E785 Hyperlipidemia, unspecified: Secondary | ICD-10-CM | POA: Insufficient documentation

## 2016-10-20 NOTE — Progress Notes (Signed)
Lower extremity doppler stable, followup with Dr. Gwenlyn Found in 5-6 month as previously planned

## 2016-10-21 ENCOUNTER — Encounter: Payer: Self-pay | Admitting: *Deleted

## 2016-11-02 ENCOUNTER — Ambulatory Visit (INDEPENDENT_AMBULATORY_CARE_PROVIDER_SITE_OTHER): Payer: Medicare Other | Admitting: Podiatry

## 2016-11-02 ENCOUNTER — Encounter: Payer: Self-pay | Admitting: Podiatry

## 2016-11-02 ENCOUNTER — Other Ambulatory Visit: Payer: Self-pay | Admitting: Endocrinology

## 2016-11-02 DIAGNOSIS — M79675 Pain in left toe(s): Secondary | ICD-10-CM

## 2016-11-02 DIAGNOSIS — M79674 Pain in right toe(s): Secondary | ICD-10-CM | POA: Diagnosis not present

## 2016-11-02 DIAGNOSIS — B351 Tinea unguium: Secondary | ICD-10-CM

## 2016-11-02 DIAGNOSIS — E1151 Type 2 diabetes mellitus with diabetic peripheral angiopathy without gangrene: Secondary | ICD-10-CM

## 2016-11-02 NOTE — Progress Notes (Addendum)
Complaint:  Visit Type: Patient returns to my office for continued preventative foot care services. Complaint: Patient states" my nails have grown long and thick and become painful to walk and wear shoes" Patient has been diagnosed with DM The patient presents for preventative foot care services. No changes to ROS.  She says the callus under her right big toe joint is  pain-free.  Podiatric Exam: Vascular: dorsalis pedis and posterior tibial pulses are diminished   bilateral. Capillary return is immediate. Temperature gradient is WNL. Skin turgor WNL  Sensorium: Diminished Semmes Weinstein monofilament test. Normal tactile sensation bilaterally. Nail Exam: Pt has thick disfigured discolored nails with subungual debris noted bilateral entire nail hallux through fifth toenails Ulcer Exam: There is no evidence of ulcer or pre-ulcerative changes or infection. Orthopedic Exam: Muscle tone and strength are WNL. No limitations in general ROM. No crepitus or effusions noted. Foot type and digits show no abnormalities. Bony prominences are unremarkable. Skin: No Porokeratosis. No infection or ulcers  Diagnosis:  Onychomycosis, , Pain in right toe, pain in left toes  Treatment & Plan Procedures and Treatment: Consent by patient was obtained for treatment procedures. The patient understood the discussion of treatment and procedures well. All questions were answered thoroughly reviewed. Debridement of mycotic and hypertrophic toenails, 1 through 5 bilateral and clearing of subungual debris. No ulceration, no infection noted. Asymptomatic callus  B/l Return Visit-Office Procedure: Patient instructed to return to the office for a follow up visit 3 months for continued evaluation and treatment.    Gardiner Barefoot DPM

## 2016-11-10 ENCOUNTER — Telehealth: Payer: Self-pay | Admitting: Endocrinology

## 2016-11-10 NOTE — Telephone Encounter (Signed)
MEDICATION: levothyroxine  PHARMACY: [facility], [address] [place] [address] Phone: [phone] Fax: [phone]    IS THIS A 90 DAY SUPPLY : yes  IS PATIENT OUT OF MEDICATION: no  IF NOT; HOW MUCH IS LEFT: 2 weeks left  LAST APPOINTMENT DATE: 07/25  NEXT APPOINTMENT DATE: 10/25  OTHER COMMENTS: Patients dose is supposed to be decreased. Please review chart to alter script if needed.

## 2016-11-11 NOTE — Telephone Encounter (Signed)
Dr. Dwyane Dee is out of the office today and tomorrow. Could you help with this since the patient is out of medication? Thanks!

## 2016-11-11 NOTE — Telephone Encounter (Signed)
Dr. Darnell Level, can you please advise me on this patient? She has been getting her Levothyroxine from a different provider and I see 2 different dosages and just want to confirm the most recent dosage to make sure she receives the appropriate dose. Is this patient to receive 5 tablets per 7 days (weekly) of Levothyroxine 125 mcg? Please advise and confirm if okay to fill. Thanks!

## 2016-11-11 NOTE — Telephone Encounter (Signed)
Oh sorry!  From his note: Her TSH is again low normal with taking 125 g 5-1/2 days a week She will take only 5 tablets per week and will change her next prescription to the 88 g dose Let's send the 88 mcg to take daily.

## 2016-11-11 NOTE — Telephone Encounter (Signed)
Routing to Dr. Dwyane Dee.

## 2016-11-12 ENCOUNTER — Other Ambulatory Visit: Payer: Self-pay

## 2016-11-12 MED ORDER — LEVOTHYROXINE SODIUM 88 MCG PO TABS: 88.0000 ug | ORAL_TABLET | Freq: Every day | ORAL | 3 refills | Status: DC

## 2016-11-12 NOTE — Telephone Encounter (Signed)
Called patient and left a voice message to let her know that I have sent in a new prescription for the Levothyroxine 88 mcg to the [facility].

## 2016-11-26 ENCOUNTER — Other Ambulatory Visit: Payer: Self-pay | Admitting: Endocrinology

## 2016-11-26 ENCOUNTER — Telehealth: Payer: Self-pay | Admitting: Endocrinology

## 2016-11-26 NOTE — Telephone Encounter (Signed)
Called patient and let her know that I have already sent these in to her pharmacy for her.

## 2016-11-26 NOTE — Telephone Encounter (Signed)
MEDICATION: nano ultrafine pen needles  PHARMACY:   [facility], [place] - [address] AT [phone] (Phone) [phone] (Fax)     IS THIS A 90 DAY SUPPLY : Y  IS PATIENT OUT OF MEDICATION:  N  IF NOT; HOW MUCH IS LEFT: 10-15   LAST APPOINTMENT DATE: 09/29/16  NEXT APPOINTMENT DATE: 12/30/16  OTHER COMMENTS:    **Let patient know to contact pharmacy at the end of the day to make sure medication is ready. **  ** Please notify patient to allow 48-72 hours to process**  **Encourage patient to contact the pharmacy for refills or they can request refills through [facility]**

## 2016-11-30 ENCOUNTER — Telehealth: Payer: Self-pay | Admitting: Endocrinology

## 2016-11-30 NOTE — Telephone Encounter (Signed)
Can you please advise on this

## 2016-11-30 NOTE — Telephone Encounter (Signed)
Having muscle cramps in legs at night.  and would like a call back to discuss.  Ty,  -LL

## 2016-11-30 NOTE — Telephone Encounter (Signed)
Gave the patient advice and she verbalized an understanding and had no further questions at this time

## 2016-11-30 NOTE — Telephone Encounter (Signed)
Please advise 

## 2016-11-30 NOTE — Telephone Encounter (Signed)
She can try taking both magnesium supplements OTC and tonic water at bedtime

## 2016-12-02 DIAGNOSIS — M47816 Spondylosis without myelopathy or radiculopathy, lumbar region: Secondary | ICD-10-CM | POA: Diagnosis not present

## 2016-12-07 DIAGNOSIS — H35373 Puckering of macula, bilateral: Secondary | ICD-10-CM | POA: Diagnosis not present

## 2016-12-07 DIAGNOSIS — E119 Type 2 diabetes mellitus without complications: Secondary | ICD-10-CM | POA: Diagnosis not present

## 2016-12-07 DIAGNOSIS — H40053 Ocular hypertension, bilateral: Secondary | ICD-10-CM | POA: Diagnosis not present

## 2016-12-09 DIAGNOSIS — M545 Low back pain: Secondary | ICD-10-CM | POA: Diagnosis not present

## 2016-12-24 ENCOUNTER — Other Ambulatory Visit: Payer: Self-pay

## 2016-12-24 MED ORDER — HYDROCODONE-ACETAMINOPHEN 5-325 MG PO TABS: ORAL_TABLET | ORAL | 0 refills | Status: DC

## 2016-12-24 NOTE — Telephone Encounter (Signed)
Patient need a refill of her hydrocodone will pickmit up mon

## 2016-12-24 NOTE — Telephone Encounter (Signed)
I have printed out this prescription and placed it on Dr. Jodelle Green desk for him to sign.

## 2016-12-24 NOTE — Telephone Encounter (Signed)
Called patient and let her know that I have placed this prescription in the file folder and she can ask for it when she comes in on Monday at the front desk.

## 2016-12-27 ENCOUNTER — Other Ambulatory Visit (INDEPENDENT_AMBULATORY_CARE_PROVIDER_SITE_OTHER): Payer: Medicare Other

## 2016-12-27 DIAGNOSIS — E1165 Type 2 diabetes mellitus with hyperglycemia: Secondary | ICD-10-CM

## 2016-12-27 DIAGNOSIS — D649 Anemia, unspecified: Secondary | ICD-10-CM

## 2016-12-27 DIAGNOSIS — E782 Mixed hyperlipidemia: Secondary | ICD-10-CM | POA: Diagnosis not present

## 2016-12-27 DIAGNOSIS — E063 Autoimmune thyroiditis: Secondary | ICD-10-CM | POA: Diagnosis not present

## 2016-12-27 DIAGNOSIS — Z794 Long term (current) use of insulin: Secondary | ICD-10-CM | POA: Diagnosis not present

## 2016-12-27 DIAGNOSIS — M48061 Spinal stenosis, lumbar region without neurogenic claudication: Secondary | ICD-10-CM | POA: Diagnosis not present

## 2016-12-27 LAB — LIPID PANEL
CHOL/HDL RATIO: 5
Cholesterol: 131 mg/dL (ref 0–200)
HDL: 26.8 mg/dL — AB (ref >39.00)
NONHDL: 104.04
Triglycerides: 280 mg/dL — ABNORMAL HIGH (ref 0.0–149.0)
VLDL: 56 mg/dL — AB (ref 0.0–40.0)

## 2016-12-27 LAB — CBC
HCT: 36.1 % (ref 36.0–46.0)
HEMOGLOBIN: 11.4 g/dL — AB (ref 12.0–15.0)
MCHC: 31.4 g/dL (ref 30.0–36.0)
MCV: 93.5 fl (ref 78.0–100.0)
PLATELETS: 240 10*3/uL (ref 150.0–400.0)
RBC: 3.86 Mil/uL — ABNORMAL LOW (ref 3.87–5.11)
RDW: 16.6 % — ABNORMAL HIGH (ref 11.5–15.5)
WBC: 7.7 10*3/uL (ref 4.0–10.5)

## 2016-12-27 LAB — COMPREHENSIVE METABOLIC PANEL
ALT: 8 U/L (ref 0–35)
AST: 13 U/L (ref 0–37)
Albumin: 3.4 g/dL — ABNORMAL LOW (ref 3.5–5.2)
Alkaline Phosphatase: 64 U/L (ref 39–117)
BUN: 40 mg/dL — ABNORMAL HIGH (ref 6–23)
CALCIUM: 9.3 mg/dL (ref 8.4–10.5)
CO2: 20 meq/L (ref 19–32)
Chloride: 106 mEq/L (ref 96–112)
Creatinine, Ser: 1.95 mg/dL — ABNORMAL HIGH (ref 0.40–1.20)
GFR: 26.53 mL/min — AB (ref >60.00)
GLUCOSE: 119 mg/dL — AB (ref 70–99)
Potassium: 5.6 mEq/L — ABNORMAL HIGH (ref 3.5–5.1)
Sodium: 137 mEq/L (ref 135–145)
Total Bilirubin: 0.3 mg/dL (ref 0.2–1.2)
Total Protein: 6.6 g/dL (ref 6.0–8.3)

## 2016-12-27 LAB — MICROALBUMIN / CREATININE URINE RATIO
CREATININE, U: 58.7 mg/dL
MICROALB UR: 19.4 mg/dL — AB (ref 0.0–1.9)
Microalb Creat Ratio: 33 mg/g — ABNORMAL HIGH (ref 0.0–30.0)

## 2016-12-27 LAB — URINALYSIS, ROUTINE W REFLEX MICROSCOPIC
Bilirubin Urine: NEGATIVE
HGB URINE DIPSTICK: NEGATIVE
Ketones, ur: NEGATIVE
Nitrite: NEGATIVE
SPECIFIC GRAVITY, URINE: 1.02 (ref 1.000–1.030)
URINE GLUCOSE: NEGATIVE
UROBILINOGEN UA: 0.2 (ref 0.0–1.0)
pH: 6 (ref 5.0–8.0)

## 2016-12-27 LAB — LDL CHOLESTEROL, DIRECT: LDL DIRECT: 60 mg/dL

## 2016-12-27 LAB — T4, FREE: FREE T4: 1.2 ng/dL (ref 0.60–1.60)

## 2016-12-27 LAB — TSH: TSH: 1.14 u[IU]/mL (ref 0.35–4.50)

## 2016-12-27 LAB — HEMOGLOBIN A1C: HEMOGLOBIN A1C: 6.3 % (ref 4.6–6.5)

## 2016-12-30 ENCOUNTER — Encounter: Payer: Self-pay | Admitting: Endocrinology

## 2016-12-30 ENCOUNTER — Ambulatory Visit (INDEPENDENT_AMBULATORY_CARE_PROVIDER_SITE_OTHER): Payer: Medicare Other | Admitting: Endocrinology

## 2016-12-30 VITALS — BP 110/58 | HR 94 | Ht 66.0 in | Wt 141.6 lb

## 2016-12-30 DIAGNOSIS — Z794 Long term (current) use of insulin: Secondary | ICD-10-CM | POA: Diagnosis not present

## 2016-12-30 DIAGNOSIS — N289 Disorder of kidney and ureter, unspecified: Secondary | ICD-10-CM | POA: Diagnosis not present

## 2016-12-30 DIAGNOSIS — E875 Hyperkalemia: Secondary | ICD-10-CM | POA: Diagnosis not present

## 2016-12-30 DIAGNOSIS — E1165 Type 2 diabetes mellitus with hyperglycemia: Secondary | ICD-10-CM

## 2016-12-30 DIAGNOSIS — D638 Anemia in other chronic diseases classified elsewhere: Secondary | ICD-10-CM

## 2016-12-30 LAB — BASIC METABOLIC PANEL
BUN: 45 mg/dL — AB (ref 6–23)
CO2: 22 mEq/L (ref 19–32)
CREATININE: 1.89 mg/dL — AB (ref 0.40–1.20)
Calcium: 9 mg/dL (ref 8.4–10.5)
Chloride: 108 mEq/L (ref 96–112)
GFR: 27.5 mL/min — AB (ref >60.00)
Glucose, Bld: 175 mg/dL — ABNORMAL HIGH (ref 70–99)
Potassium: 5.3 mEq/L — ABNORMAL HIGH (ref 3.5–5.1)
Sodium: 137 mEq/L (ref 135–145)

## 2016-12-30 NOTE — Patient Instructions (Signed)
Stop hydralazine  Low potassium diet  4-6 more Novolog if sugars go up with Cortisone shot

## 2016-12-30 NOTE — Progress Notes (Signed)
Patient ID: [name] [name], female   DOB: 1940-09-19, 76 y.o.   MRN: [id number]   Reason for Appointment: Follow-up of various problems  History of Present Illness    RENAL dysfunction:  Renal function is variable,  appears to be slightly worse, previous creatinine about 1.5 Does not have diabetic nephropathy She also appears to have high potassium level which is unusual, previously below normal  Currently on hydralazine 10 mg 3 times a day and 25 mg metoprolol She takes Lasix 20 mg every other day  She says her diet is usually not high in citrus foods, bananas or baked potatoes She may take occasional magnesium for muscle cramps  Lab Results  Component Value Date   CREATININE 1.95 (H) 12/27/2016   CREATININE 1.59 (H) 09/23/2016   CREATININE 1.49 (H) 09/09/2016   CREATININE 1.47 (H) 07/26/2016   Lab Results  Component Value Date   CREATININE 1.95 (H) 12/27/2016   BUN 40 (H) 12/27/2016   NA 137 12/27/2016   K 5.6 (H) 12/27/2016   CL 106 12/27/2016   CO2 20 12/27/2016     Type 2 DIABETES mellitus, date of diagnosis: 1986.   The insulin regimen is: Levemir 14 units hs, Novolog 6 ac twice a day   Type 2 diabetes  has been treated in the last few years with low dose basal bolus insulin regimen. She has not been taking  any oral hypoglycemic drugs including metformin partly because of GI side effects  A1c Is ranging from 5.9-6.3, now 6.3  Current management and problems:  Her blood sugars recently have been excellent and recently ranging mostly between about 100 up to 130  Only once had a relatively high reading of 206 late afternoon  No hypoglycemia also   She is consistent with taking her mealtime insulin before eating  Also fasting readings are looking fairly good with 14 of Levemir  Her weight has gone down a little recently  Usually eating small portions  She is not able to exercise because of back pain  Side effects from medications:  Diarrhea from metformin and nausea and vomiting from GLP-1 drugs   Monitors blood glucose: Less than 1 times a day  Glucometer: One Touch.   Blood sugar readings as above  Average for the last 14 and 30 days respectively = 113 and 120   Meals: she is usually eating low fat meals especially recently meals usually at 11 AM,and supper at 5 pm.  Calorie intake: Usually controlled. Moderate Carbs Physical activity: exercise: Minimal  Dietician visit: Most recent:, 5/13.   Wt Readings from Last 3 Encounters:  12/30/16 141 lb 9.6 oz (64.2 kg)  09/29/16 145 lb 9.6 oz (66 kg)  09/28/16 147 lb (66.7 kg)    Lab Results  Component Value Date   HGBA1C 6.3 12/27/2016   HGBA1C 6.0 09/23/2016   HGBA1C 5.9 (H) 09/09/2016   Lab Results  Component Value Date   MICROALBUR 19.4 (H) 12/27/2016   LDLCALC 60 05/21/2016   CREATININE 1.95 (H) 12/27/2016    ANEMIA:  Taking iron supplements Hemoglobin has been mildly low, about the same now   Lab Results  Component Value Date   WBC 7.7 12/27/2016   HGB 11.4 (L) 12/27/2016   HCT 36.1 12/27/2016   MCV 93.5 12/27/2016   PLT 240.0 12/27/2016      HYPOTHYROIDISM: She has had long-standing primary hypothyroidism She is taking Levothyroxine, 88 g daily more recently  Previously had required some reduction  in her doses  She does feel fairly good overall and TSH is consistently normal and not as low this visit  Lab Results  Component Value Date   TSH 1.14 12/27/2016   TSH 0.64 09/23/2016   TSH 0.39 07/26/2016   FREET4 1.20 12/27/2016   FREET4 1.44 09/23/2016   FREET4 1.31 07/26/2016    OTHER active problems: See review of systems    Allergies as of 12/30/2016   No Known Allergies     Medication List       Accurate as of 12/30/16  3:14 PM. Always use your most recent med list.          aspirin 81 MG chewable tablet Chew 81 mg by mouth daily.   B-D ULTRAFINE III SHORT PEN 31G X 8 MM Misc Generic drug:  Insulin Pen  Needle USE TO INJECT INSULIN FOUR TIMES DAILY   calcium-vitamin D 500-200 MG-UNIT tablet Commonly known as:  OSCAL WITH D Take 1 tablet by mouth every morning.   ferrous sulfate 325 (65 FE) MG tablet Take 650 mg by mouth every evening.   furosemide 20 MG tablet Commonly known as:  LASIX TAKE 1 TABLET BY MOUTH ONCE DAILY   glucose blood test strip Commonly known as:  ONE TOUCH ULTRA TEST Use to test blood sugar 1-2 times daily Dx code- E11.9   hydrALAZINE 10 MG tablet Commonly known as:  APRESOLINE Take 1 tablet (10 mg total) by mouth every 8 (eight) hours.   HYDROcodone-acetaminophen 5-325 MG tablet Commonly known as:  NORCO/VICODIN Take 1 tablet every 12 hours as needed for back pain   isosorbide mononitrate 30 MG 24 hr tablet Commonly known as:  IMDUR take 1 tablet by mouth once daily   latanoprost 0.005 % ophthalmic solution Commonly known as:  XALATAN Place 1 drop into both eyes at bedtime.   LEVEMIR 100 UNIT/ML injection Generic drug:  insulin detemir Inject 14 Units into the skin at bedtime.   levothyroxine 88 MCG tablet Commonly known as:  SYNTHROID, LEVOTHROID Take 1 tablet (88 mcg total) by mouth daily.   metoprolol succinate 25 MG 24 hr tablet Commonly known as:  TOPROL-XL TAKE 1 TABLET DAILY   NOVOLOG FLEXPEN 100 UNIT/ML FlexPen Generic drug:  insulin aspart Inject 6 Units into the skin 3 (three) times daily with meals.   omega-3 acid ethyl esters 1 g capsule Commonly known as:  LOVAZA Take 1 capsule (1 g total) by mouth 2 (two) times daily.   omeprazole 20 MG capsule Commonly known as:  PRILOSEC TAKE 1 CAPSULE DAILY   polyethylene glycol packet Commonly known as:  MIRALAX / GLYCOLAX Take 17 g by mouth daily as needed for moderate constipation.   rosuvastatin 20 MG tablet Commonly known as:  CRESTOR Take 1 tablet (20 mg total) by mouth daily. Take 1 tablet daily   VENTOLIN HFA 108 (90 Base) MCG/ACT inhaler Generic drug:  albuterol inhale  2 puffs by mouth INTO THE LUNGS every 6 hours if needed for wheezing   vitamin B-12 1000 MCG tablet Commonly known as:  CYANOCOBALAMIN Take 1,000 mcg by mouth daily.   vitamin C 500 MG tablet Commonly known as:  ASCORBIC ACID Take 500 mg by mouth at bedtime.   Vitamin D (Ergocalciferol) 50000 units Caps capsule Commonly known as:  DRISDOL TAKE 1 CAPSULE BY MOUTH EVERY 14 DAYS       Allergies: No Known Allergies  Past Medical History:  Diagnosis Date  . AAA (abdominal aortic aneurysm) (  [place])   . Bilateral hydronephrosis   . Carotid artery stenosis    carotid doppler 06/2012 - Right CCA/Bulb/ICA with chronic occlusion; L vertebral artery with abnormal blood flow; L Bulb/Prox ICA  s/p endarterectomy with mild fibrous plaque, 50% diameter reduction  . CHF (congestive heart failure) ([place])   . Choledocholithiasis 2017  . CKD (chronic kidney disease), stage III ([place])   . Coronary artery disease due to lipid rich plaque cardiologist-  dr berry   s/p CABG x6 1997-- cath 12-09-2009 occluded vein to obtuse marginal branch and ramus branch with patent vien to PDA and patent LIMA to LAD, ef 40%-- Myoview 11-24-2011, nonischemic  . Diverticulosis   . Dyspnea on exertion   . GERD (gastroesophageal reflux disease)   . GI bleed   . History of sepsis    10-18-2014 w/ acute pyelonephritis  . Hyperlipidemia   . Hypertension   . Hypothyroidism   . Ischemic cardiomyopathy    03-25-2010-- per lasts echo EF  50-55%  . PAD (peripheral artery disease) ([place])    09/2010 LEAs - R ABI of 0.45, occluded fem-pop bypass graft, L ABI of 0.59 with occluded SFA; severe arterial insuff  . PVD (peripheral vascular disease) with claudication ([place])    last duplex 07-04-2015 -- Right CCA and ICA chronic occlusion, 71-24% LICA, Patent vertebral arteries w/ antegrade flow, bilateral normal subclavian arteries   . Retroperitoneal fibrosis   . Tubular adenoma of colon   . Type 2 diabetes mellitus ([place])    monitored  by dr Dwyane Dee    Past Surgical History:  Procedure Laterality Date  . AORTA - BILATERAL FEMORAL ARTERY BYPASS GRAFT  1997   and RIGHT FEM-POP   . CARDIAC CATHETERIZATION  12-09-2009  dr al little   EF >40%-- occluded vein to OM & ramus branches, patent vein to PDA, patent LIMA to LAD (Dr. [name], [facility]) - later had thrombectomy of R fem-pop bypass ad R common femoral & profunda femoris artery (Dr. Oneida Alar)  . CARDIOVASCULAR STRESS TEST  11-24-2011   dr berry   Low Risk study: fixed basal to mid inferior attenuation artifact, no reversible ischemia,  normal LV function and wall motion , ef 67%  . CAROTID ENDARTERECTOMY Bilateral right 1994//  left ?  Marland Kitchen CATARACT EXTRACTION W/ INTRAOCULAR LENS  IMPLANT, BILATERAL  2006  . CHOLECYSTECTOMY N/A 09/05/2015   Procedure: ATTEMPTED LAPAROSCOPIC CHOLECYSTECTOMY, EXPLORATORY LAPAROTOMY WITH CHOLECYSTECTOMY;  Surgeon: Autumn Messing III, MD;  Location: [place];  Service: General;  Laterality: N/A;  . COLONOSCOPY    . COLONOSCOPY  10/21/2015  . COLONOSCOPY WITH PROPOFOL N/A 10/21/2015   Procedure: COLONOSCOPY WITH PROPOFOL;  Surgeon: Milus Banister, MD;  Location: [place];  Service: Endoscopy;  Laterality: N/A;  . CORONARY ARTERY BYPASS GRAFT  1997   x6; internal mammary to LAD, SVG to ramus #1 & #2, SVG to OM, SVG to PDA,   . CYSTOSCOPY W/ RETROGRADES Right 08/01/2015   Procedure: CYSTOSCOPY WITH RETROGRADE PYELOGRAM;  Surgeon: Ardis Hughs, MD;  Location[facility];  Service: Urology;  Laterality: Right;  . CYSTOSCOPY W/ URETERAL STENT PLACEMENT  03/10/2012   Procedure: CYSTOSCOPY WITH RETROGRADE PYELOGRAM/URETERAL STENT PLACEMENT;  Surgeon: Hanley Ben, MD;  Location: [facility];  Service: Urology;  Laterality: Left;  . CYSTOSCOPY W/ URETERAL STENT PLACEMENT Bilateral 03/07/2015   Procedure: BILATERAL RETROGRADE PYELOGRAM AND RIGHT URETERAL STENT PLACEMENT;  Surgeon: Ardis Hughs, MD;  Location: [facility];  Service:  Urology;  Laterality: Bilateral;  . CYSTOSCOPY W/ URETERAL STENT PLACEMENT Right 08/01/2015   Procedure: CYSTOSCOPY WITH STENT REPLACEMENT;  Surgeon: Ardis Hughs, MD;  Location: [facility];  Service: Urology;  Laterality: Right;  . CYSTOSCOPY W/ URETERAL STENT PLACEMENT Right 02/13/2016   Procedure: RIGHT URETERAL STENT EXCHANGE;  Surgeon: Ardis Hughs, MD;  Location: [facility];  Service: Urology;  Laterality: Right;  . CYSTOSCOPY W/ URETERAL STENT PLACEMENT Right 09/17/2016   Procedure: CYSTOSCOPY WITH RIGHT  RETROGRADE PYELOGRAM RIGHT URETERAL STENT EXCHANGE;  Surgeon: Ardis Hughs, MD;  Location: [facility];  Service: Urology;  Laterality: Right;  . ERCP N/A 09/03/2015   Procedure: ENDOSCOPIC RETROGRADE CHOLANGIOPANCREATOGRAPHY (ERCP);  Surgeon: Irene Shipper, MD;  Location: [facility]COPY;  Service: Endoscopy;  Laterality: N/A;  . ESOPHAGOGASTRODUODENOSCOPY    . ESOPHAGOGASTRODUODENOSCOPY N/A 10/19/2015   Procedure: ESOPHAGOGASTRODUODENOSCOPY (EGD);  Surgeon: Gatha Mayer, MD;  Location[facility]COPY;  Service: Endoscopy;  Laterality: N/A;  . GIVENS CAPSULE STUDY  10/21/2015  . GIVENS CAPSULE STUDY N/A 10/21/2015   Procedure: GIVENS CAPSULE STUDY;  Surgeon: Milus Banister, MD;  Location: [place];  Service: Endoscopy;  Laterality: N/A;  . REPAIR RIGHT FEMORAL PSEUDOANEUYSM/  RIGHT FEM-POP BYPASS GRAFT/  DEBRIDEMENT RIGHT LOWER EXTREMITIY VENOUS STATUS ULCERS X2  01-12-2005  . TOTAL ABDOMINAL HYSTERECTOMY W/ BILATERAL SALPINGOOPHORECTOMY  1986  . TRANSTHORACIC ECHOCARDIOGRAM  03/25/2010   EF 81-82%, LV systolic function low normal with mild inferoseptal hypocontractility; LA mildly dilated; mod MR; mild TR, RV systolic pressure elevated, mild pulm HTN; AV mildly sclerotic; mild pulm valve regurg; aortic root sclerosis/calcif     Family History  Problem Relation Age of Onset  . Congestive Heart Failure Mother   . Diabetes Mother   . Stroke Father   . Cancer  Maternal Aunt        Breast cancer    Social History:  reports that she quit smoking about 7 years ago. She has a 60.00 pack-year smoking history. She has never used smokeless tobacco. She reports that she drinks about 1.8 oz of alcohol per week . She reports that she does not use drugs.  Review of Systems -     Hyperlipidemia: Has history of high LDL and triglycerides/low HDL  Was changed from Lipitor to Crestor because of higher LDL, tolerating this well    LDL is Controlled Tends to have high triglycerides, not clear why her level is over 300, previously 167 She does not think she is eating larger amounts of carbohydrates or higher fat meals and no unusual food intake the night before Her cardiologist wants her to take Lovaza 1 g twice a day which she has not started yet   Lab Results  Component Value Date   CHOL 131 12/27/2016   HDL 26.80 (L) 12/27/2016   LDLCALC 60 05/21/2016   LDLDIRECT 60.0 12/27/2016   TRIG 280.0 (H) 12/27/2016   CHOLHDL 5 12/27/2016     Vitamin D deficiency: She has been on supplements and taking her 50,000 units every other week  Her level is normal this year  Her orthopedic doctor is treating her for the low back pain which is chronic  Recently has been recommended ?  Epidural steroid and she is concerned about side effects from this   History of UTI:  had a stent replaced, follows with urologist regularly    CHF: She was admitted with shortness of breath and orthopnea on 01/23/16 This has been followed by cardiologist She has  a low ejection fraction of 20-25 % and is  on hydralazine 2 times a day along with 20 mg Lasix every second day Also she had atrial flutter which has resolved   She feels fairly good without dyspnea  Examination:   BP (!) 110/58   Pulse 94   Ht 5\' 6"  (1.676 m)   Wt 141 lb 9.6 oz (64.2 kg)   SpO2 95%   BMI 22.85 kg/m   Body mass index is 22.85 kg/m.     Standing blood pressure 110/58 No ankle edema present     Assesment/PLAN:    RENAL insufficiency:  Likely to be from vascular insufficiency and glomerular sclerosis However creatinine is higher than usual and not clear why, high blood pressure is not any lower than usual  HYPOKALEMIA: Potassium is 5.6 on the 22nd and will need to be rechecked today Will prescribe Kayexalate if this is again high Given patient information on hyperkalemia and diet  ANEMIA: Likely to be from chronic disease and fairly stable Continue taking iron and take it with food again  DIABETES: Her A1c is 6.3 which is adequate She is taking low dose insulin with fairly even blood sugar control both morning and evening Her weight is down slightly Overall doing well with her diet Not able to do much exercise  HYPOTHYROIDISM: Her TSH is back to normal and she will continue the same regimen   LIPIDS:  Continues to have higher triglycerides despite using Lovaza May not be able to take Fenofibrate because of renal dysfunction   she will have having influenza vaccine on her visit in 3 weeks   Patient Instructions  Stop hydralazine  Low potassium diet  4-6 more Novolog if sugars go up with Cortisone shot    total time for visit = 25 minutes for review of multiple problems, review of home blood sugar records, labs, previous records, counseling   [facility]  12/30/16    Note: This office note was prepared with Dragon voice recognition system technology. Any transcriptional errors that result from this process are unintentional.  Addendum: Potassium 5.3 today, will treat with diet alone

## 2016-12-31 DIAGNOSIS — M48061 Spinal stenosis, lumbar region without neurogenic claudication: Secondary | ICD-10-CM | POA: Diagnosis not present

## 2016-12-31 DIAGNOSIS — M545 Low back pain: Secondary | ICD-10-CM | POA: Diagnosis not present

## 2017-01-03 ENCOUNTER — Other Ambulatory Visit: Payer: Self-pay | Admitting: Cardiovascular Disease

## 2017-01-03 NOTE — Progress Notes (Signed)
Please call to let patient know that the potassium level is not as high, if she does not have a low potassium diet will need to send this to her

## 2017-01-05 ENCOUNTER — Telehealth: Payer: Self-pay | Admitting: Endocrinology

## 2017-01-05 NOTE — Telephone Encounter (Signed)
Please advise 

## 2017-01-05 NOTE — Telephone Encounter (Signed)
Patient ask if you could call her in some something to the pharmacy for her cough, it is bad and nothing is helping, send to [facility] on [address]

## 2017-01-05 NOTE — Telephone Encounter (Signed)
Called patient and she stated that she is having just the dry cough at night and will try the Mucinex. She also stated that she has not had a fever or discolored sputum. I also let patient know that I was sending the Hyperkalemia information in the mail to her.

## 2017-01-05 NOTE — Telephone Encounter (Signed)
If she is just having a dry cough she can take OTC Mucinex DM.  Please check.  She is having any fever, dyspnea or discolored sputum

## 2017-01-10 ENCOUNTER — Telehealth: Payer: Self-pay

## 2017-01-10 ENCOUNTER — Other Ambulatory Visit: Payer: Self-pay | Admitting: *Deleted

## 2017-01-10 ENCOUNTER — Other Ambulatory Visit: Payer: Self-pay

## 2017-01-10 MED ORDER — ALBUTEROL SULFATE HFA 108 (90 BASE) MCG/ACT IN AERS: 2.0000 | INHALATION_SPRAY | Freq: Four times a day (QID) | RESPIRATORY_TRACT | 2 refills | Status: DC | PRN

## 2017-01-10 MED ORDER — ISOSORBIDE MONONITRATE ER 30 MG PO TB24: 30.0000 mg | ORAL_TABLET | Freq: Every day | ORAL | 4 refills | Status: DC

## 2017-01-10 NOTE — Telephone Encounter (Signed)
Called patient and let her know that I have sent to the pharmacy for her and also recommended Desitin for diaper rash and she can ask her pharmacist when she goes to pick up her medication.

## 2017-01-10 NOTE — Telephone Encounter (Signed)
Called patient and she stated that she does not have discolored sputum and she does not know if she has had a fever and she does not have shortness of breath. She stated that she feels like it has not broke loose yet.  She also asked if there is a cream or something to put on her bed sore or ulcer she stated? Please advise.

## 2017-01-10 NOTE — Telephone Encounter (Signed)
She can try albuterol inhaler, 2 puffs up to 4 times a day in addition to Mucinex.  Also on her bed sore she can apply something like diaper rash cream and cover with a transparent dressing

## 2017-01-10 NOTE — Telephone Encounter (Signed)
Patient called and states that the Mucinex DM is not working she states she needs something else for the cough. Patient states she also have a bed sore on her Buttock on the edge of her spine. Patient is requesting a call back. Her call back number is [phone]. Please advise. Thank you

## 2017-01-10 NOTE — Telephone Encounter (Signed)
Please advise 

## 2017-01-13 DIAGNOSIS — M48061 Spinal stenosis, lumbar region without neurogenic claudication: Secondary | ICD-10-CM | POA: Diagnosis not present

## 2017-01-14 DIAGNOSIS — M48061 Spinal stenosis, lumbar region without neurogenic claudication: Secondary | ICD-10-CM | POA: Diagnosis not present

## 2017-01-14 DIAGNOSIS — M545 Low back pain: Secondary | ICD-10-CM | POA: Diagnosis not present

## 2017-01-14 DIAGNOSIS — M47816 Spondylosis without myelopathy or radiculopathy, lumbar region: Secondary | ICD-10-CM | POA: Diagnosis not present

## 2017-01-24 ENCOUNTER — Emergency Department (HOSPITAL_COMMUNITY): Payer: Medicare Other

## 2017-01-24 ENCOUNTER — Inpatient Hospital Stay (HOSPITAL_COMMUNITY)
Admission: EM | Admit: 2017-01-24 | Discharge: 2017-01-27 | DRG: 683 | Disposition: A | Discharge status: Home Health | Payer: Medicare Other | Attending: Internal Medicine | Admitting: Internal Medicine

## 2017-01-24 ENCOUNTER — Encounter (HOSPITAL_COMMUNITY): Payer: Self-pay

## 2017-01-24 ENCOUNTER — Other Ambulatory Visit: Payer: Self-pay

## 2017-01-24 DIAGNOSIS — I4892 Unspecified atrial flutter: Secondary | ICD-10-CM | POA: Diagnosis not present

## 2017-01-24 DIAGNOSIS — I10 Essential (primary) hypertension: Secondary | ICD-10-CM | POA: Diagnosis not present

## 2017-01-24 DIAGNOSIS — N390 Urinary tract infection, site not specified: Secondary | ICD-10-CM | POA: Diagnosis present

## 2017-01-24 DIAGNOSIS — Z8249 Family history of ischemic heart disease and other diseases of the circulatory system: Secondary | ICD-10-CM

## 2017-01-24 DIAGNOSIS — E869 Volume depletion, unspecified: Secondary | ICD-10-CM | POA: Diagnosis present

## 2017-01-24 DIAGNOSIS — G8929 Other chronic pain: Secondary | ICD-10-CM | POA: Diagnosis present

## 2017-01-24 DIAGNOSIS — E119 Type 2 diabetes mellitus without complications: Secondary | ICD-10-CM

## 2017-01-24 DIAGNOSIS — M545 Low back pain: Secondary | ICD-10-CM

## 2017-01-24 DIAGNOSIS — I13 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 1 through stage 4 chronic kidney disease, or unspecified chronic kidney disease: Secondary | ICD-10-CM | POA: Diagnosis present

## 2017-01-24 DIAGNOSIS — E039 Hypothyroidism, unspecified: Secondary | ICD-10-CM | POA: Diagnosis present

## 2017-01-24 DIAGNOSIS — Z7982 Long term (current) use of aspirin: Secondary | ICD-10-CM

## 2017-01-24 DIAGNOSIS — N184 Chronic kidney disease, stage 4 (severe): Secondary | ICD-10-CM | POA: Diagnosis not present

## 2017-01-24 DIAGNOSIS — N10 Acute tubulo-interstitial nephritis: Secondary | ICD-10-CM | POA: Diagnosis not present

## 2017-01-24 DIAGNOSIS — L89609 Pressure ulcer of unspecified heel, unspecified stage: Secondary | ICD-10-CM

## 2017-01-24 DIAGNOSIS — E785 Hyperlipidemia, unspecified: Secondary | ICD-10-CM | POA: Diagnosis present

## 2017-01-24 DIAGNOSIS — E11649 Type 2 diabetes mellitus with hypoglycemia without coma: Secondary | ICD-10-CM

## 2017-01-24 DIAGNOSIS — Z951 Presence of aortocoronary bypass graft: Secondary | ICD-10-CM

## 2017-01-24 DIAGNOSIS — N189 Chronic kidney disease, unspecified: Secondary | ICD-10-CM | POA: Diagnosis present

## 2017-01-24 DIAGNOSIS — N183 Chronic kidney disease, stage 3 unspecified: Secondary | ICD-10-CM | POA: Diagnosis present

## 2017-01-24 DIAGNOSIS — Z823 Family history of stroke: Secondary | ICD-10-CM

## 2017-01-24 DIAGNOSIS — M48061 Spinal stenosis, lumbar region without neurogenic claudication: Secondary | ICD-10-CM | POA: Diagnosis present

## 2017-01-24 DIAGNOSIS — L89152 Pressure ulcer of sacral region, stage 2: Secondary | ICD-10-CM | POA: Diagnosis present

## 2017-01-24 DIAGNOSIS — I739 Peripheral vascular disease, unspecified: Secondary | ICD-10-CM | POA: Diagnosis present

## 2017-01-24 DIAGNOSIS — N179 Acute kidney failure, unspecified: Secondary | ICD-10-CM | POA: Diagnosis not present

## 2017-01-24 DIAGNOSIS — N3001 Acute cystitis with hematuria: Secondary | ICD-10-CM

## 2017-01-24 DIAGNOSIS — E1122 Type 2 diabetes mellitus with diabetic chronic kidney disease: Secondary | ICD-10-CM | POA: Diagnosis present

## 2017-01-24 DIAGNOSIS — N2889 Other specified disorders of kidney and ureter: Secondary | ICD-10-CM | POA: Diagnosis present

## 2017-01-24 DIAGNOSIS — Z833 Family history of diabetes mellitus: Secondary | ICD-10-CM

## 2017-01-24 DIAGNOSIS — D649 Anemia, unspecified: Secondary | ICD-10-CM | POA: Diagnosis not present

## 2017-01-24 DIAGNOSIS — I5042 Chronic combined systolic (congestive) and diastolic (congestive) heart failure: Secondary | ICD-10-CM | POA: Diagnosis present

## 2017-01-24 DIAGNOSIS — L8990 Pressure ulcer of unspecified site, unspecified stage: Secondary | ICD-10-CM

## 2017-01-24 DIAGNOSIS — Z794 Long term (current) use of insulin: Secondary | ICD-10-CM

## 2017-01-24 DIAGNOSIS — K219 Gastro-esophageal reflux disease without esophagitis: Secondary | ICD-10-CM | POA: Diagnosis present

## 2017-01-24 DIAGNOSIS — I255 Ischemic cardiomyopathy: Secondary | ICD-10-CM | POA: Diagnosis present

## 2017-01-24 DIAGNOSIS — I2510 Atherosclerotic heart disease of native coronary artery without angina pectoris: Secondary | ICD-10-CM | POA: Diagnosis present

## 2017-01-24 DIAGNOSIS — Z79899 Other long term (current) drug therapy: Secondary | ICD-10-CM

## 2017-01-24 DIAGNOSIS — E1151 Type 2 diabetes mellitus with diabetic peripheral angiopathy without gangrene: Secondary | ICD-10-CM | POA: Diagnosis present

## 2017-01-24 LAB — CBC WITH DIFFERENTIAL/PLATELET
Basophils Absolute: 0 10*3/uL (ref 0.0–0.1)
Basophils Relative: 0 %
EOS ABS: 0.2 10*3/uL (ref 0.0–0.7)
Eosinophils Relative: 3 %
HEMATOCRIT: 35.8 % — AB (ref 36.0–46.0)
HEMOGLOBIN: 11.4 g/dL — AB (ref 12.0–15.0)
LYMPHS ABS: 1.3 10*3/uL (ref 0.7–4.0)
Lymphocytes Relative: 15 %
MCH: 29.5 pg (ref 26.0–34.0)
MCHC: 31.8 g/dL (ref 30.0–36.0)
MCV: 92.7 fL (ref 78.0–100.0)
Monocytes Absolute: 0.6 10*3/uL (ref 0.1–1.0)
Monocytes Relative: 7 %
NEUTROS ABS: 6.2 10*3/uL (ref 1.7–7.7)
NEUTROS PCT: 75 %
Platelets: 319 10*3/uL (ref 150–400)
RBC: 3.86 MIL/uL — AB (ref 3.87–5.11)
RDW: 16.1 % — ABNORMAL HIGH (ref 11.5–15.5)
WBC: 8.3 10*3/uL (ref 4.0–10.5)

## 2017-01-24 LAB — URINALYSIS, ROUTINE W REFLEX MICROSCOPIC
Bilirubin Urine: NEGATIVE
GLUCOSE, UA: NEGATIVE mg/dL
Ketones, ur: NEGATIVE mg/dL
NITRITE: NEGATIVE
PROTEIN: NEGATIVE mg/dL
Specific Gravity, Urine: 1.013 (ref 1.005–1.030)
pH: 5 (ref 5.0–8.0)

## 2017-01-24 LAB — BASIC METABOLIC PANEL
Anion gap: 8 (ref 5–15)
BUN: 58 mg/dL — AB (ref 6–20)
CHLORIDE: 118 mmol/L — AB (ref 101–111)
CO2: 14 mmol/L — ABNORMAL LOW (ref 22–32)
Calcium: 9.3 mg/dL (ref 8.9–10.3)
Creatinine, Ser: 2.47 mg/dL — ABNORMAL HIGH (ref 0.44–1.00)
GFR calc non Af Amer: 18 mL/min — ABNORMAL LOW (ref >60)
GFR, EST AFRICAN AMERICAN: 21 mL/min — AB (ref >60)
Glucose, Bld: 112 mg/dL — ABNORMAL HIGH (ref 65–99)
POTASSIUM: 4.6 mmol/L (ref 3.5–5.1)
SODIUM: 140 mmol/L (ref 135–145)

## 2017-01-24 MED ORDER — LACTATED RINGERS IV BOLUS (SEPSIS)
1000.0000 mL | Freq: Once | INTRAVENOUS | Status: AC
  Administered 2017-01-24: 1000 mL via INTRAVENOUS

## 2017-01-24 MED ORDER — MORPHINE SULFATE (PF) 4 MG/ML IV SOLN
4.0000 mg | Freq: Once | INTRAVENOUS | Status: AC
Start: 2017-01-24 — End: 2017-01-24
  Administered 2017-01-24: 4 mg via INTRAVENOUS
  Filled 2017-01-24: qty 1

## 2017-01-24 MED ORDER — SODIUM CHLORIDE 0.9 % IV BOLUS (SEPSIS)
1000.0000 mL | Freq: Once | INTRAVENOUS | Status: AC
  Administered 2017-01-24: 1000 mL via INTRAVENOUS

## 2017-01-24 MED ORDER — INSULIN ASPART 100 UNIT/ML ~~LOC~~ SOLN: 0.0000 [IU] | Freq: Every day | SUBCUTANEOUS | Status: DC

## 2017-01-24 MED ORDER — INSULIN ASPART 100 UNIT/ML ~~LOC~~ SOLN: 0.0000 [IU] | Freq: Three times a day (TID) | SUBCUTANEOUS | Status: DC

## 2017-01-24 NOTE — ED Notes (Signed)
Family at bedside. 

## 2017-01-24 NOTE — ED Provider Notes (Signed)
[facility] DEPT Provider Note   CSN: [id number] Arrival date & time: 01/24/17  1510     History   Chief Complaint Chief Complaint  Patient presents with  . Back Pain    HPI [name] [name] is a 76 y.o. female.  HPI Pt has been having back pain troubles.  She saw her doctor previously and was given an epidural.  It was doing better but then about a week ago it started acting up again.  The pain is in the lower back.  It radiates down to the waist.  No trouble with weakness or numbness.  Urinating OK.   Pt normally walks with a cane.  She is feeling worse and is having trouble walking.  She called her doctor and was told to try ice and tylenol.  She is seeing Dr Lynann Bologna.  She had an MRI in June 2017.  Dr Lynann Bologna did do more recent images.  It gets worse with activity. Past Medical History:  Diagnosis Date  . AAA (abdominal aortic aneurysm) ([place])   . Bilateral hydronephrosis   . Carotid artery stenosis    carotid doppler 06/2012 - Right CCA/Bulb/ICA with chronic occlusion; L vertebral artery with abnormal blood flow; L Bulb/Prox ICA  s/p endarterectomy with mild fibrous plaque, 50% diameter reduction  . CHF (congestive heart failure) ([place])   . Choledocholithiasis 2017  . CKD (chronic kidney disease), stage III ([place])   . Coronary artery disease due to lipid rich plaque cardiologist-  dr berry   s/p CABG x6 1997-- cath 12-09-2009 occluded vein to obtuse marginal branch and ramus branch with patent vien to PDA and patent LIMA to LAD, ef 40%-- Myoview 11-24-2011, nonischemic  . Diverticulosis   . Dyspnea on exertion   . GERD (gastroesophageal reflux disease)   . GI bleed   . History of sepsis    10-18-2014 w/ acute pyelonephritis  . Hyperlipidemia   . Hypertension   . Hypothyroidism   . Ischemic cardiomyopathy    03-25-2010-- per lasts echo EF  50-55%  . PAD (peripheral artery disease) ([place])    09/2010 LEAs - R ABI of 0.45, occluded fem-pop bypass  graft, L ABI of 0.59 with occluded SFA; severe arterial insuff  . PVD (peripheral vascular disease) with claudication ([place])    last duplex 07-04-2015 -- Right CCA and ICA chronic occlusion, 28-41% LICA, Patent vertebral arteries w/ antegrade flow, bilateral normal subclavian arteries   . Retroperitoneal fibrosis   . Tubular adenoma of colon   . Type 2 diabetes mellitus ([place])    monitored by dr Dwyane Dee      Past Surgical History:  Procedure Laterality Date  . AORTA - BILATERAL FEMORAL ARTERY BYPASS GRAFT  1997   and RIGHT FEM-POP   . ATTEMPTED LAPAROSCOPIC CHOLECYSTECTOMY, EXPLORATORY LAPAROTOMY WITH CHOLECYSTECTOMY N/A 09/05/2015   Performed by Jovita Kussmaul, MD at [facility]  . BILATERAL RETROGRADE PYELOGRAM AND RIGHT URETERAL STENT PLACEMENT Bilateral 03/07/2015   Performed by Ardis Hughs, MD at [facility]  . CARDIAC CATHETERIZATION  12-09-2009  dr al little   EF >40%-- occluded vein to OM & ramus branches, patent vein to PDA, patent LIMA to LAD (Dr. [name], [facility]) - later had thrombectomy of R fem-pop bypass ad R common femoral & profunda femoris artery (Dr. Oneida Alar)  . CARDIOVASCULAR STRESS TEST  11-24-2011   dr berry   Low Risk study: fixed basal to mid inferior attenuation artifact, no reversible ischemia,  normal  LV function and wall motion , ef 67%  . CAROTID ENDARTERECTOMY Bilateral right 1994//  left ?  Marland Kitchen CATARACT EXTRACTION W/ INTRAOCULAR LENS  IMPLANT, BILATERAL  2006  . COLONOSCOPY    . COLONOSCOPY  10/21/2015  . COLONOSCOPY WITH PROPOFOL N/A 10/21/2015   Performed by Milus Banister, MD at [facility] ENDOSCOPY  . CORONARY ARTERY BYPASS GRAFT  1997   x6; internal mammary to LAD, SVG to ramus #1 & #2, SVG to OM, SVG to PDA,   . CYSTOSCOPY WITH RETROGRADE PYELOGRAM Right 08/01/2015   Performed by Ardis Hughs, MD at [facility]  . CYSTOSCOPY WITH RETROGRADE PYELOGRAM/URETERAL STENT PLACEMENT Left 03/10/2012   Performed by Hanley Ben, MD at  [facility] ORS  . CYSTOSCOPY WITH RIGHT  RETROGRADE PYELOGRAM RIGHT URETERAL STENT EXCHANGE Right 09/17/2016   Performed by Ardis Hughs, MD at [facility] ORS  . CYSTOSCOPY WITH STENT REPLACEMENT Right 08/01/2015   Performed by Ardis Hughs, MD at [facility] (ERCP) N/A 09/03/2015   Performed by Irene Shipper, MD at [facility]  . ESOPHAGOGASTRODUODENOSCOPY    . ESOPHAGOGASTRODUODENOSCOPY (EGD) N/A 10/19/2015   Performed by Gatha Mayer, MD at [facility]  . GIVENS CAPSULE STUDY  10/21/2015  . GIVENS CAPSULE STUDY N/A 10/21/2015   Performed by Milus Banister, MD at [facility] ENDOSCOPY  . REPAIR RIGHT FEMORAL PSEUDOANEUYSM/  RIGHT FEM-POP BYPASS GRAFT/  DEBRIDEMENT RIGHT LOWER EXTREMITIY VENOUS STATUS ULCERS X2  01-12-2005  . RIGHT URETERAL STENT EXCHANGE Right 02/13/2016   Performed by Ardis Hughs, MD at [facility]  . TOTAL ABDOMINAL HYSTERECTOMY W/ BILATERAL SALPINGOOPHORECTOMY  1986  . TRANSTHORACIC ECHOCARDIOGRAM  03/25/2010   EF 34-19%, LV systolic function low normal with mild inferoseptal hypocontractility; LA mildly dilated; mod MR; mild TR, RV systolic pressure elevated, mild pulm HTN; AV mildly sclerotic; mild pulm valve regurg; aortic root sclerosis/calcif     OB History    No data available       Home Medications    Prior to Admission medications   Medication Sig Start Date End Date Taking? Authorizing Provider  albuterol (PROVENTIL HFA;VENTOLIN HFA) 108 (90 Base) MCG/ACT inhaler Inhale 2 puffs every 6 (six) hours as needed into the lungs for wheezing or shortness of breath. 01/10/17  Yes [name], MD  aspirin 81 MG chewable tablet Chew 81 mg by mouth daily.    Yes [provider]  calcium-vitamin D (OSCAL WITH D) 500-200 MG-UNIT per tablet Take 1 tablet by mouth every morning.   Yes [provider]  ferrous sulfate 325 (65 FE) MG tablet Take 650 mg by mouth every evening.    Yes [provider]  furosemide (LASIX) 20 MG tablet TAKE 1 TABLET BY MOUTH ONCE DAILY Patient taking differently: TAKE 1 TABLET BY MOUTH EVERY OTHER DAY 10/10/16  Yes [name], MD  HYDROcodone-acetaminophen (NORCO/VICODIN) 5-325 MG tablet Take 1 tablet every 12 hours as needed for back pain 12/24/16  Yes [name], MD  insulin aspart (NOVOLOG FLEXPEN) 100 UNIT/ML FlexPen Inject 4 Units 3 (three) times daily with meals into the skin.    Yes [provider]  insulin detemir (LEVEMIR) 100 UNIT/ML injection Inject 14 Units into the skin at bedtime.   Yes [provider]  isosorbide mononitrate (IMDUR) 30 MG 24 hr tablet Take 1 tablet (30 mg total) daily by mouth. 01/10/17  Yes [name], MD  latanoprost Ivin Poot)  0.005 % ophthalmic solution Place 1 drop into both eyes at bedtime. 03/11/14  Yes [provider]  levothyroxine (SYNTHROID, LEVOTHROID) 88 MCG tablet Take 1 tablet (88 mcg total) by mouth daily. 11/12/16  Yes [name], MD  metoprolol succinate (TOPROL-XL) 25 MG 24 hr tablet TAKE 1 TABLET DAILY Patient taking differently: TAKE 1 TABLET PO DAILY 11/03/16  Yes [name], MD  omega-3 acid ethyl esters (LOVAZA) 1 g capsule Take 1 capsule (1 g total) by mouth 2 (two) times daily. 09/28/16  Yes Meng, Isaac Laud, PA  omeprazole (PRILOSEC) 20 MG capsule TAKE 1 CAPSULE DAILY 04/05/16  Yes [name], MD  rosuvastatin (CRESTOR) 20 MG tablet Take 1 tablet (20 mg total) by mouth daily. Take 1 tablet daily 05/25/16  Yes [name], MD  vitamin B-12 (CYANOCOBALAMIN) 1000 MCG tablet Take 1,000 mcg by mouth daily.   Yes [provider]  vitamin C (ASCORBIC ACID) 500 MG tablet Take 500 mg by mouth at bedtime.    Yes [provider]  B-D ULTRAFINE III SHORT PEN 31G X 8 MM MISC USE TO INJECT INSULIN FOUR TIMES DAILY 11/26/16   [name], MD  glucose blood (ONE TOUCH ULTRA TEST) test strip Use to test blood sugar 1-2 times daily Dx code- E11.9 05/07/16   [name], MD    hydrALAZINE (APRESOLINE) 10 MG tablet TAKE 1 TABLET BY MOUTH EVERY 8 HOURS Patient not taking: Reported on 01/24/2017 01/04/17   [name], MD  polyethylene glycol (MIRALAX / Floria Raveling) packet Take 17 g by mouth daily as needed for moderate constipation. 09/08/15   Mikhail, Velta Addison, DO  VENTOLIN HFA 108 (90 Base) MCG/ACT inhaler inhale 2 puffs by mouth INTO THE LUNGS every 6 hours if needed for wheezing Patient not taking: Reported on 01/24/2017 12/08/15   [name], MD  Vitamin D, Ergocalciferol, (DRISDOL) 50000 units CAPS capsule TAKE 1 CAPSULE BY MOUTH EVERY 14 DAYS 03/11/16   [name], MD    Family History Family History  Problem Relation Age of Onset  . Congestive Heart Failure Mother   . Diabetes Mother   . Stroke Father   . Cancer Maternal Aunt        Breast cancer    Social History Social History   Tobacco Use  . Smoking status: Former Smoker    Packs/day: 2.00    Years: 30.00    Pack years: 60.00    Last attempt to quit: 06/07/2009    Years since quitting: 7.6  . Smokeless tobacco: Never Used  Substance Use Topics  . Alcohol use: Yes    Alcohol/week: 1.8 oz    Types: 3 Standard drinks or equivalent per week    Comment: ocassional  . Drug use: No     Allergies   Patient has no known allergies.   Review of Systems Review of Systems  All other systems reviewed and are negative.    Physical Exam Updated Vital Signs BP (!) 141/73 (BP Location: Right Arm)   Pulse 68   Temp (!) 97.5 F (36.4 C) (Oral)   Resp 18   Ht 1.676 m (5\' 6" )   Wt 60.3 kg (133 lb)   SpO2 98%   BMI 21.47 kg/m   Physical Exam  Constitutional: No distress.  HENT:  Head: Normocephalic and atraumatic.  Right Ear: External ear normal.  Left Ear: External ear normal.  Mouth/Throat: No oropharyngeal exudate.  Eyes: Conjunctivae are normal. Right eye exhibits no discharge. Left eye exhibits no  discharge. No scleral icterus.  Neck: Neck supple. No tracheal deviation present.   Cardiovascular: Normal rate, regular rhythm and intact distal pulses.  Pulmonary/Chest: Effort normal and breath sounds normal. No stridor. No respiratory distress. She has no wheezes. She has no rales.  Abdominal: Soft. Bowel sounds are normal. She exhibits no distension. There is no tenderness. There is no rebound and no guarding.  Musculoskeletal: She exhibits no edema.  ttp lumbar spine, small superficial decub at sacrum  Neurological: She is alert. She has normal strength. No cranial nerve deficit (no facial droop, extraocular movements intact, no slurred speech) or sensory deficit. She exhibits normal muscle tone. She displays no seizure activity. Coordination normal.  Skin: Skin is warm and dry. No rash noted. She is not diaphoretic.  Psychiatric: She has a normal mood and affect.  Nursing note and vitals reviewed.    ED Treatments / Results  Labs (all labs ordered are listed, but only abnormal results are displayed) Labs Reviewed  CBC WITH DIFFERENTIAL/PLATELET - Abnormal; Notable for the following components:      Result Value   RBC 3.86 (*)    Hemoglobin 11.4 (*)    HCT 35.8 (*)    RDW 16.1 (*)    All other components within normal limits  BASIC METABOLIC PANEL - Abnormal; Notable for the following components:   Chloride 118 (*)    CO2 14 (*)    Glucose, Bld 112 (*)    BUN 58 (*)    Creatinine, Ser 2.47 (*)    GFR calc non Af Amer 18 (*)    GFR calc Af Amer 21 (*)    All other components within normal limits  URINALYSIS, ROUTINE W REFLEX MICROSCOPIC - Abnormal; Notable for the following components:   APPearance CLOUDY (*)    Hgb urine dipstick LARGE (*)    Leukocytes, UA LARGE (*)    Bacteria, UA RARE (*)    Squamous Epithelial / LPF 6-30 (*)    All other components within normal limits     Radiology Dg Lumbar Spine Complete  Result Date: 01/24/2017 CLINICAL DATA:  Low back pain.  Worsening back pain EXAM: LUMBAR SPINE - COMPLETE 4+ VIEW COMPARISON:  X-ray  and 116 18 FINDINGS: Again demonstrated RIGHT double-J ureteral stent. The inferior aspect the stent is not imaged Normal alignment of lumbar vertebral bodies. No loss of vertebral body height or disc height. No pars fracture. No subluxation. Pleurodesis noted in the LEFT lung base. IMPRESSION: 1. No acute findings lumbar spine. 2. Extensive vascular atherosclerotic calcification. 3. RIGHT ureteral stent in place Electronically Signed   By: Suzy Bouchard M.D.   On: 01/24/2017 20:31    Procedures Procedures (including critical care time)  Medications Ordered in ED Medications  lactated ringers bolus 1,000 mL (not administered)  sodium chloride 0.9 % bolus 1,000 mL (1,000 mLs Intravenous New Bag/Given 01/24/17 2129)  morphine 4 MG/ML injection 4 mg (4 mg Intravenous Given 01/24/17 2124)     Initial Impression / Assessment and Plan / ED Course  I have reviewed the triage vital signs and the nursing notes.  Pertinent labs & imaging results that were available during my care of the patient were reviewed by me and considered in my medical decision making (see chart for details).   Patient presented to the emergency room with complaints of worsening low back pain and weakness.  Previously she had been seeing an orthopedic doctor.  She was given spinal injection and was told that  surgery would be the last option.  Patient was also concerned that she was getting dehydrated and may have a urinary tract infection.  Laboratory tests are notable for an acute on chronic kidney injury.  His creatinine is elevated 2.47.  Her bicarb is also low at 14.  Urinalysis is consistent with a urinary tract infection I will start the patient on IV antibiotics.  We will continue IV fluids.  I will consult the medical service for admission.  Patient does have renal stents I will order an ultrasound to make sure there is no signs of hydronephrosis.  Final Clinical Impressions(s) / ED Diagnoses   Final diagnoses:   Chronic midline low back pain without sciatica  Acute cystitis with hematuria  Acute renal failure superimposed on chronic kidney disease, unspecified CKD stage, unspecified acute renal failure type [facility])    ED Discharge Orders    None       Dorie Rank, MD 01/24/17 2228

## 2017-01-24 NOTE — ED Notes (Signed)
Unable to collect labs patient is not in room

## 2017-01-24 NOTE — ED Triage Notes (Signed)
Patient c/o mid and lower back pain that has been worse in the past week. Patient states she has been treated for arthritis in her back.

## 2017-01-24 NOTE — H&P (Signed)
[facility] History and Physical  [name] [name] WLS:[id number] DOB: 1940/05/27 DOA: 01/24/2017  Referring physician: Dr Tomi Bamberger PCP: [name], MD   Chief Complaint: Back pain, gen weakness  HPI: [name] [name] is a 76 y.o. female with hx of AAA, CKD 3, GI bleed, HTN, HL, ICM/ PAD/ CAD, DM2 who presented to ED with gen'd weakness and back pain.  Hx of arthritis in her back.  LS spine xrays were unremarkable.  Labs show pyuria on UA, and creat up at 2.47, baseline creat 1.6- 1.9.  Asked to see for admission.   Pt says she had a spinal epidural shot recently.  About a week ago her back pain started to flare up again.  Also she developed a head cold.  Her back pain kept getting worse and over the last 2 days has barely been able to walk.  Has been taking tylenol, occ takes vicodin but then ran out.  No fever or dysuria, had chills this morning first time.  No n/v, +some mild diarrhea.  No abd pain or CP, no SOB.  +odor to the urine.  Back pain is mid lower back w/o radiation.    She has a stent in the R ureter which gets changed about every 6 mos, next time due is Jan 2019.   Patient is divorced, has one daughter who just left a few min ago.  Pt lives in [place] and dtr lives in [place]. NO tob etoh.    Chart: Jul 17 - gen weakness, LE weakness, hypotension. ^LFT /s, had ERCP with stone extraction then lap chole after cards clearance.  UTI. DM2 on insulin. CKD IV Aug 17 - gen weakness, heme + stool, Hb 5.1, on plavix and asa. Got 2u prbc. EGD normal, colon w/ removal 4 polyps, left colon tics. Capsule endo, results pending at dc. Hx remote CABG '97, also hx bilat CEA and aortobifem bypass with R fem-pop bypass. Hx RPF and R hydronephrosis.  R ureteral stents every 6 mos exchange per dr Louis Meckel.  Nov 17 - acute/ chron diast CHF, CAD, DM, HTN, GI bleed, atrial flutter, a/c renal. LVEF 20-25%, treated with lasix.  Jan 18 - fell in grocery store, legs got weak.  Dx was possible UTI, rx  rocephin, ucx negative. Creat 1.8 > 1.4.  DM2   ROS  denies CP  no joint pain   no HA  no blurry vision  no rash  no diarrhea  no nausea/ vomiting  no dysuria  no difficulty voiding  no change in urine color    Past Medical History  Past Medical History:  Diagnosis Date  . AAA (abdominal aortic aneurysm) ([place])   . Bilateral hydronephrosis   . Carotid artery stenosis    carotid doppler 06/2012 - Right CCA/Bulb/ICA with chronic occlusion; L vertebral artery with abnormal blood flow; L Bulb/Prox ICA  s/p endarterectomy with mild fibrous plaque, 50% diameter reduction  . CHF (congestive heart failure) ([place])   . Choledocholithiasis 2017  . CKD (chronic kidney disease), stage III ([place])   . Coronary artery disease due to lipid rich plaque cardiologist-  dr berry   s/p CABG x6 1997-- cath 12-09-2009 occluded vein to obtuse marginal branch and ramus branch with patent vien to PDA and patent LIMA to LAD, ef 40%-- Myoview 11-24-2011, nonischemic  . Diverticulosis   . Dyspnea on exertion   . GERD (gastroesophageal reflux disease)   . GI bleed   . History of sepsis  10-18-2014 w/ acute pyelonephritis  . Hyperlipidemia   . Hypertension   . Hypothyroidism   . Ischemic cardiomyopathy    03-25-2010-- per lasts echo EF  50-55%  . PAD (peripheral artery disease) ([place])    09/2010 LEAs - R ABI of 0.45, occluded fem-pop bypass graft, L ABI of 0.59 with occluded SFA; severe arterial insuff  . PVD (peripheral vascular disease) with claudication ([place])    last duplex 07-04-2015 -- Right CCA and ICA chronic occlusion, 92-42% LICA, Patent vertebral arteries w/ antegrade flow, bilateral normal subclavian arteries   . Retroperitoneal fibrosis   . Tubular adenoma of colon   . Type 2 diabetes mellitus ([place])    monitored by dr Dwyane Dee   Past Surgical History  Past Surgical History:  Procedure Laterality Date  . AORTA - BILATERAL FEMORAL ARTERY BYPASS GRAFT  1997   and RIGHT FEM-POP   . ATTEMPTED  LAPAROSCOPIC CHOLECYSTECTOMY, EXPLORATORY LAPAROTOMY WITH CHOLECYSTECTOMY N/A 09/05/2015   Performed by Jovita Kussmaul, MD at [place]  . BILATERAL RETROGRADE PYELOGRAM AND RIGHT URETERAL STENT PLACEMENT Bilateral 03/07/2015   Performed by Ardis Hughs, MD at [facility]  . CARDIAC CATHETERIZATION  12-09-2009  dr al little   EF >40%-- occluded vein to OM & ramus branches, patent vein to PDA, patent LIMA to LAD (Dr. [name], [facility]) - later had thrombectomy of R fem-pop bypass ad R common femoral & profunda femoris artery (Dr. Oneida Alar)  . CARDIOVASCULAR STRESS TEST  11-24-2011   dr berry   Low Risk study: fixed basal to mid inferior attenuation artifact, no reversible ischemia,  normal LV function and wall motion , ef 67%  . CAROTID ENDARTERECTOMY Bilateral right 1994//  left ?  Marland Kitchen CATARACT EXTRACTION W/ INTRAOCULAR LENS  IMPLANT, BILATERAL  2006  . COLONOSCOPY    . COLONOSCOPY  10/21/2015  . COLONOSCOPY WITH PROPOFOL N/A 10/21/2015   Performed by Milus Banister, MD at [facility] ENDOSCOPY  . CORONARY ARTERY BYPASS GRAFT  1997   x6; internal mammary to LAD, SVG to ramus #1 & #2, SVG to OM, SVG to PDA,   . CYSTOSCOPY WITH RETROGRADE PYELOGRAM Right 08/01/2015   Performed by Ardis Hughs, MD at [facility]  . CYSTOSCOPY WITH RETROGRADE PYELOGRAM/URETERAL STENT PLACEMENT Left 03/10/2012   Performed by Hanley Ben, MD at [facility] ORS  . CYSTOSCOPY WITH RIGHT  RETROGRADE PYELOGRAM RIGHT URETERAL STENT EXCHANGE Right 09/17/2016   Performed by Ardis Hughs, MD at [facility] ORS  . CYSTOSCOPY WITH STENT REPLACEMENT Right 08/01/2015   Performed by Ardis Hughs, MD at [place] (ERCP) N/A 09/03/2015   Performed by Irene Shipper, MD at [place]  . ESOPHAGOGASTRODUODENOSCOPY    . ESOPHAGOGASTRODUODENOSCOPY (EGD) N/A 10/19/2015   Performed by Gatha Mayer, MD at [place]  . GIVENS CAPSULE STUDY   10/21/2015  . GIVENS CAPSULE STUDY N/A 10/21/2015   Performed by Milus Banister, MD at [facility] ENDOSCOPY  . REPAIR RIGHT FEMORAL PSEUDOANEUYSM/  RIGHT FEM-POP BYPASS GRAFT/  DEBRIDEMENT RIGHT LOWER EXTREMITIY VENOUS STATUS ULCERS X2  01-12-2005  . RIGHT URETERAL STENT EXCHANGE Right 02/13/2016   Performed by Ardis Hughs, MD at [facility] ORS  . TOTAL ABDOMINAL HYSTERECTOMY W/ BILATERAL SALPINGOOPHORECTOMY  1986  . TRANSTHORACIC ECHOCARDIOGRAM  03/25/2010   EF 68-34%, LV systolic function low normal with mild inferoseptal hypocontractility; LA mildly dilated; mod MR; mild TR, RV systolic pressure elevated, mild  pulm HTN; AV mildly sclerotic; mild pulm valve regurg; aortic root sclerosis/calcif    Family History  Family History  Problem Relation Age of Onset  . Congestive Heart Failure Mother   . Diabetes Mother   . Stroke Father   . Cancer Maternal Aunt        Breast cancer   Social History  reports that she quit smoking about 7 years ago. She has a 60.00 pack-year smoking history. she has never used smokeless tobacco. She reports that she drinks about 1.8 oz of alcohol per week. She reports that she does not use drugs. Allergies No Known Allergies Home medications Prior to Admission medications   Medication Sig Start Date End Date Taking? Authorizing Provider  albuterol (PROVENTIL HFA;VENTOLIN HFA) 108 (90 Base) MCG/ACT inhaler Inhale 2 puffs every 6 (six) hours as needed into the lungs for wheezing or shortness of breath. 01/10/17  Yes [name], MD  aspirin 81 MG chewable tablet Chew 81 mg by mouth daily.    Yes [provider]  calcium-vitamin D (OSCAL WITH D) 500-200 MG-UNIT per tablet Take 1 tablet by mouth every morning.   Yes [provider]  ferrous sulfate 325 (65 FE) MG tablet Take 650 mg by mouth every evening.    Yes [provider]  furosemide (LASIX) 20 MG tablet TAKE 1 TABLET BY MOUTH ONCE DAILY Patient taking differently: TAKE 1 TABLET BY MOUTH EVERY  OTHER DAY 10/10/16  Yes [name], MD  HYDROcodone-acetaminophen (NORCO/VICODIN) 5-325 MG tablet Take 1 tablet every 12 hours as needed for back pain 12/24/16  Yes [name], MD  insulin aspart (NOVOLOG FLEXPEN) 100 UNIT/ML FlexPen Inject 4 Units 3 (three) times daily with meals into the skin.    Yes [provider]  insulin detemir (LEVEMIR) 100 UNIT/ML injection Inject 14 Units into the skin at bedtime.   Yes [provider]  isosorbide mononitrate (IMDUR) 30 MG 24 hr tablet Take 1 tablet (30 mg total) daily by mouth. 01/10/17  Yes [name], MD  latanoprost (XALATAN) 0.005 % ophthalmic solution Place 1 drop into both eyes at bedtime. 03/11/14  Yes [provider]  levothyroxine (SYNTHROID, LEVOTHROID) 88 MCG tablet Take 1 tablet (88 mcg total) by mouth daily. 11/12/16  Yes [name], MD  metoprolol succinate (TOPROL-XL) 25 MG 24 hr tablet TAKE 1 TABLET DAILY Patient taking differently: TAKE 1 TABLET PO DAILY 11/03/16  Yes [name], MD  omega-3 acid ethyl esters (LOVAZA) 1 g capsule Take 1 capsule (1 g total) by mouth 2 (two) times daily. 09/28/16  Yes Meng, Isaac Laud, PA  omeprazole (PRILOSEC) 20 MG capsule TAKE 1 CAPSULE DAILY 04/05/16  Yes [name], MD  rosuvastatin (CRESTOR) 20 MG tablet Take 1 tablet (20 mg total) by mouth daily. Take 1 tablet daily 05/25/16  Yes [name], MD  vitamin B-12 (CYANOCOBALAMIN) 1000 MCG tablet Take 1,000 mcg by mouth daily.   Yes [provider]  vitamin C (ASCORBIC ACID) 500 MG tablet Take 500 mg by mouth at bedtime.    Yes [provider]  B-D ULTRAFINE III SHORT PEN 31G X 8 MM MISC USE TO INJECT INSULIN FOUR TIMES DAILY 11/26/16   [name], MD  glucose blood (ONE TOUCH ULTRA TEST) test strip Use to test blood sugar 1-2 times daily Dx code- E11.9 05/07/16   [name], MD  hydrALAZINE (APRESOLINE) 10 MG tablet TAKE 1 TABLET BY MOUTH EVERY 8 HOURS Patient not taking: Reported on 01/24/2017 01/04/17  [name], MD  polyethylene glycol [facility] Floria Raveling) packet Take 17 g by mouth daily as needed for moderate constipation. 09/08/15   Mikhail, Velta Addison, DO  VENTOLIN HFA 108 (90 Base) MCG/ACT inhaler inhale 2 puffs by mouth INTO THE LUNGS every 6 hours if needed for wheezing Patient not taking: Reported on 01/24/2017 12/08/15   [name], MD  Vitamin D, Ergocalciferol, (DRISDOL) 50000 units CAPS capsule TAKE 1 CAPSULE BY MOUTH EVERY 14 DAYS 03/11/16   [name], MD   Liver Function Tests No results for input(s): AST, ALT, ALKPHOS, BILITOT, PROT, ALBUMIN in the last 168 hours. No results for input(s): LIPASE, AMYLASE in the last 168 hours. CBC Recent Labs  Lab 01/24/17 2057  WBC 8.3  NEUTROABS 6.2  HGB 11.4*  HCT 35.8*  MCV 92.7  PLT 537   Basic Metabolic Panel Recent Labs  Lab 01/24/17 2057  NA 140  K 4.6  CL 118*  CO2 14*  GLUCOSE 112*  BUN 58*  CREATININE 2.47*  CALCIUM 9.3     Vitals:   01/24/17 1619 01/24/17 1621 01/24/17 2018  BP: (!) 124/54  (!) 141/73  Pulse: 61  68  Resp: 16  18  Temp: (!) 97.4 F (36.3 C)  (!) 97.5 F (36.4 C)  TempSrc: Oral  Oral  SpO2: 100%  98%  Weight:  60.3 kg (133 lb)   Height:  '5\' 6"'  (1.676 m)    Exam: Gen elderly somewhat frail WF, pale No rash, cyanosis or gangrene Sclera anicteric, throat clear and dry  No jvd or bruits, bilat CEA scars Chest clear bilat RRR no MRG Abd soft ntnd no mass or ascites +bs, scars well healed GU defer MS no joint effusions or deformity Ext no LE or UE edema / no wounds or ulcers Neuro is alert, Ox 3 , nf, gen weak    Home meds: -lasix 20 qd/ toprol xl 25 qd/ hydralazine 10 tid -asa 81/ imdur 30 qd/ crestor 20 qd -insulin detemir 14 hs/ novolog 4u tid  -albuterol / ca w D/ norco prn/ T4/ lovaza/ prilosec 20 qd/ B12/ vit C/ miralax prn/ ventolin HFA  Na 140 K 4.6  CO2 14  VUN 58  Cr 2.47   Ca 9.3  eGFR 20  WBC 8k  Hb 11.4 plt 319 UA > cloudy, large LE/ Hb, 5.0, rare bact, TNTC WBC and  tntc RBC's, 6-30 epis  Lumbar spine xray >  1. No acute findings lumbar spine. 2. Extensive vascular atherosclerotic calcification. 3. RIGHT ureteral stent in place   Assessment: 1. Pyelonephritis, acute - IV abx 2. RPF w/ secondary ureteral strictures - has R ureteral stent. Renal US pend.  3. Gen'd weakness - gait difficulty 4. Back pain - acute on chronic 5. AKI on CKD 3/4 - left kidney minimally functional (by lasix renogram in the past); R kidney w ureteral stent, give IVF's with bicarb gtt.  Use caution given low LVEF 6. Vol depletion - hold lasix 7. CAD hx CABG and ICM (EF 20-25%) 8. Severe PAD - hx aortobifem, fem-pop, bilat CEA 9. DM2 - cont insulin , + SSI   Plan - as above       [place] D [facility] Pager [phone]   If 7PM-7AM, please contact night-coverage [email or web address] Password [email or web address] 01/24/2017, 10:42 PM

## 2017-01-24 NOTE — ED Notes (Signed)
Assigned@ [id number] room [id number]

## 2017-01-25 DIAGNOSIS — N390 Urinary tract infection, site not specified: Secondary | ICD-10-CM | POA: Diagnosis present

## 2017-01-25 DIAGNOSIS — L8990 Pressure ulcer of unspecified site, unspecified stage: Secondary | ICD-10-CM

## 2017-01-25 DIAGNOSIS — N10 Acute tubulo-interstitial nephritis: Secondary | ICD-10-CM | POA: Diagnosis not present

## 2017-01-25 DIAGNOSIS — E1122 Type 2 diabetes mellitus with diabetic chronic kidney disease: Secondary | ICD-10-CM | POA: Diagnosis present

## 2017-01-25 DIAGNOSIS — Z79899 Other long term (current) drug therapy: Secondary | ICD-10-CM | POA: Diagnosis not present

## 2017-01-25 DIAGNOSIS — L89609 Pressure ulcer of unspecified heel, unspecified stage: Secondary | ICD-10-CM

## 2017-01-25 DIAGNOSIS — M48061 Spinal stenosis, lumbar region without neurogenic claudication: Secondary | ICD-10-CM | POA: Diagnosis present

## 2017-01-25 DIAGNOSIS — I5042 Chronic combined systolic (congestive) and diastolic (congestive) heart failure: Secondary | ICD-10-CM | POA: Diagnosis present

## 2017-01-25 DIAGNOSIS — N2889 Other specified disorders of kidney and ureter: Secondary | ICD-10-CM | POA: Diagnosis present

## 2017-01-25 DIAGNOSIS — N184 Chronic kidney disease, stage 4 (severe): Secondary | ICD-10-CM | POA: Diagnosis present

## 2017-01-25 DIAGNOSIS — N179 Acute kidney failure, unspecified: Secondary | ICD-10-CM | POA: Diagnosis not present

## 2017-01-25 DIAGNOSIS — D649 Anemia, unspecified: Secondary | ICD-10-CM | POA: Diagnosis present

## 2017-01-25 DIAGNOSIS — G8929 Other chronic pain: Secondary | ICD-10-CM | POA: Diagnosis present

## 2017-01-25 DIAGNOSIS — I2510 Atherosclerotic heart disease of native coronary artery without angina pectoris: Secondary | ICD-10-CM | POA: Diagnosis present

## 2017-01-25 DIAGNOSIS — I13 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 1 through stage 4 chronic kidney disease, or unspecified chronic kidney disease: Secondary | ICD-10-CM | POA: Diagnosis present

## 2017-01-25 DIAGNOSIS — E1151 Type 2 diabetes mellitus with diabetic peripheral angiopathy without gangrene: Secondary | ICD-10-CM | POA: Diagnosis present

## 2017-01-25 DIAGNOSIS — E785 Hyperlipidemia, unspecified: Secondary | ICD-10-CM | POA: Diagnosis present

## 2017-01-25 DIAGNOSIS — Z823 Family history of stroke: Secondary | ICD-10-CM | POA: Diagnosis not present

## 2017-01-25 DIAGNOSIS — Z794 Long term (current) use of insulin: Secondary | ICD-10-CM | POA: Diagnosis not present

## 2017-01-25 DIAGNOSIS — Z7982 Long term (current) use of aspirin: Secondary | ICD-10-CM | POA: Diagnosis not present

## 2017-01-25 DIAGNOSIS — I255 Ischemic cardiomyopathy: Secondary | ICD-10-CM | POA: Diagnosis present

## 2017-01-25 DIAGNOSIS — L89152 Pressure ulcer of sacral region, stage 2: Secondary | ICD-10-CM | POA: Diagnosis present

## 2017-01-25 DIAGNOSIS — E039 Hypothyroidism, unspecified: Secondary | ICD-10-CM | POA: Diagnosis present

## 2017-01-25 DIAGNOSIS — Z951 Presence of aortocoronary bypass graft: Secondary | ICD-10-CM | POA: Diagnosis not present

## 2017-01-25 DIAGNOSIS — N189 Chronic kidney disease, unspecified: Secondary | ICD-10-CM | POA: Diagnosis not present

## 2017-01-25 DIAGNOSIS — I4892 Unspecified atrial flutter: Secondary | ICD-10-CM | POA: Diagnosis present

## 2017-01-25 DIAGNOSIS — N3001 Acute cystitis with hematuria: Secondary | ICD-10-CM | POA: Diagnosis not present

## 2017-01-25 DIAGNOSIS — Z833 Family history of diabetes mellitus: Secondary | ICD-10-CM | POA: Diagnosis not present

## 2017-01-25 DIAGNOSIS — E869 Volume depletion, unspecified: Secondary | ICD-10-CM | POA: Diagnosis present

## 2017-01-25 DIAGNOSIS — K219 Gastro-esophageal reflux disease without esophagitis: Secondary | ICD-10-CM | POA: Diagnosis present

## 2017-01-25 LAB — GLUCOSE, CAPILLARY
Glucose-Capillary: 92 mg/dL (ref 65–99)
Glucose-Capillary: 82 mg/dL (ref 65–99)
Glucose-Capillary: 106 mg/dL — ABNORMAL HIGH (ref 65–99)
Glucose-Capillary: 120 mg/dL — ABNORMAL HIGH (ref 65–99)
Glucose-Capillary: 139 mg/dL — ABNORMAL HIGH (ref 65–99)

## 2017-01-25 LAB — BASIC METABOLIC PANEL
ANION GAP: 9 (ref 5–15)
BUN: 48 mg/dL — AB (ref 6–20)
CHLORIDE: 118 mmol/L — AB (ref 101–111)
CO2: 14 mmol/L — ABNORMAL LOW (ref 22–32)
Calcium: 8.3 mg/dL — ABNORMAL LOW (ref 8.9–10.3)
Creatinine, Ser: 1.95 mg/dL — ABNORMAL HIGH (ref 0.44–1.00)
GFR calc non Af Amer: 24 mL/min — ABNORMAL LOW (ref >60)
GFR, EST AFRICAN AMERICAN: 28 mL/min — AB (ref >60)
GLUCOSE: 92 mg/dL (ref 65–99)
POTASSIUM: 4.3 mmol/L (ref 3.5–5.1)
Sodium: 141 mmol/L (ref 135–145)

## 2017-01-25 LAB — CBC
HEMATOCRIT: 32.6 % — AB (ref 36.0–46.0)
Hemoglobin: 10.2 g/dL — ABNORMAL LOW (ref 12.0–15.0)
MCH: 29.2 pg (ref 26.0–34.0)
MCHC: 31.3 g/dL (ref 30.0–36.0)
MCV: 93.4 fL (ref 78.0–100.0)
Platelets: 250 10*3/uL (ref 150–400)
RBC: 3.49 MIL/uL — ABNORMAL LOW (ref 3.87–5.11)
RDW: 16.2 % — AB (ref 11.5–15.5)
WBC: 8.6 10*3/uL (ref 4.0–10.5)

## 2017-01-25 MED ORDER — ENOXAPARIN SODIUM 30 MG/0.3ML ~~LOC~~ SOLN
30.0000 mg | SUBCUTANEOUS | Status: DC
  Administered 2017-01-25 – 2017-01-27 (×3): 30 mg via SUBCUTANEOUS
  Filled 2017-01-25 (×3): qty 0.3

## 2017-01-25 MED ORDER — VITAMIN C 500 MG PO TABS
500.0000 mg | ORAL_TABLET | Freq: Every day | ORAL | Status: DC
  Administered 2017-01-25 – 2017-01-26 (×2): 500 mg via ORAL
  Filled 2017-01-25 (×2): qty 1

## 2017-01-25 MED ORDER — ONDANSETRON HCL 4 MG/2ML IJ SOLN: 4.0000 mg | Freq: Four times a day (QID) | INTRAMUSCULAR | Status: DC | PRN

## 2017-01-25 MED ORDER — ALBUTEROL SULFATE HFA 108 (90 BASE) MCG/ACT IN AERS: 2.0000 | INHALATION_SPRAY | Freq: Four times a day (QID) | RESPIRATORY_TRACT | Status: DC | PRN

## 2017-01-25 MED ORDER — LIP MEDEX EX OINT
TOPICAL_OINTMENT | CUTANEOUS | Status: DC | PRN
  Filled 2017-01-25: qty 7

## 2017-01-25 MED ORDER — ASPIRIN 81 MG PO CHEW
81.0000 mg | CHEWABLE_TABLET | Freq: Every day | ORAL | Status: DC
  Administered 2017-01-25 – 2017-01-27 (×3): 81 mg via ORAL
  Filled 2017-01-25 (×3): qty 1

## 2017-01-25 MED ORDER — ONDANSETRON HCL 4 MG PO TABS: 4.0000 mg | ORAL_TABLET | Freq: Four times a day (QID) | ORAL | Status: DC | PRN

## 2017-01-25 MED ORDER — POLYETHYLENE GLYCOL 3350 17 G PO PACK
17.0000 g | PACK | Freq: Every day | ORAL | Status: DC | PRN
  Administered 2017-01-27: 17 g via ORAL
  Filled 2017-01-25: qty 1

## 2017-01-25 MED ORDER — VITAMIN B-12 1000 MCG PO TABS
1000.0000 ug | ORAL_TABLET | Freq: Every day | ORAL | Status: DC
  Administered 2017-01-25 – 2017-01-27 (×3): 1000 ug via ORAL
  Filled 2017-01-25 (×3): qty 1

## 2017-01-25 MED ORDER — ISOSORBIDE MONONITRATE ER 30 MG PO TB24
30.0000 mg | ORAL_TABLET | Freq: Every day | ORAL | Status: DC
  Administered 2017-01-25 – 2017-01-27 (×3): 30 mg via ORAL
  Filled 2017-01-25 (×3): qty 1

## 2017-01-25 MED ORDER — LATANOPROST 0.005 % OP SOLN
1.0000 [drp] | Freq: Every day | OPHTHALMIC | Status: DC
Start: 2017-01-25 — End: 2017-01-27
  Administered 2017-01-25 – 2017-01-26 (×2): 1 [drp] via OPHTHALMIC
  Filled 2017-01-25: qty 2.5

## 2017-01-25 MED ORDER — LEVOTHYROXINE SODIUM 88 MCG PO TABS
88.0000 ug | ORAL_TABLET | Freq: Every day | ORAL | Status: DC
  Administered 2017-01-25 – 2017-01-27 (×3): 88 ug via ORAL
  Filled 2017-01-25 (×3): qty 1

## 2017-01-25 MED ORDER — INSULIN DETEMIR 100 UNIT/ML ~~LOC~~ SOLN
10.0000 [IU] | Freq: Every day | SUBCUTANEOUS | Status: DC
  Administered 2017-01-25 – 2017-01-26 (×3): 10 [IU] via SUBCUTANEOUS
  Filled 2017-01-25 (×4): qty 0.1

## 2017-01-25 MED ORDER — OMEGA-3-ACID ETHYL ESTERS 1 G PO CAPS
1.0000 g | ORAL_CAPSULE | Freq: Two times a day (BID) | ORAL | Status: DC
  Administered 2017-01-25 – 2017-01-27 (×5): 1 g via ORAL
  Filled 2017-01-25 (×5): qty 1

## 2017-01-25 MED ORDER — ROSUVASTATIN CALCIUM 20 MG PO TABS
20.0000 mg | ORAL_TABLET | Freq: Every day | ORAL | Status: DC
  Administered 2017-01-25 – 2017-01-27 (×3): 20 mg via ORAL
  Filled 2017-01-25 (×3): qty 1

## 2017-01-25 MED ORDER — DEXTROSE 5 % IV SOLN
1.0000 g | Freq: Every day | INTRAVENOUS | Status: DC
  Administered 2017-01-25 (×2): 1 g via INTRAVENOUS
  Filled 2017-01-25 (×2): qty 10

## 2017-01-25 MED ORDER — HYDROCODONE-ACETAMINOPHEN 5-325 MG PO TABS
1.0000 | ORAL_TABLET | Freq: Four times a day (QID) | ORAL | Status: DC | PRN
  Administered 2017-01-25 – 2017-01-26 (×4): 1 via ORAL
  Administered 2017-01-26 – 2017-01-27 (×2): 2 via ORAL
  Filled 2017-01-25: qty 2
  Filled 2017-01-25 (×3): qty 1
  Filled 2017-01-25: qty 2
  Filled 2017-01-25: qty 1

## 2017-01-25 MED ORDER — METOPROLOL SUCCINATE ER 25 MG PO TB24
25.0000 mg | ORAL_TABLET | Freq: Every day | ORAL | Status: DC
  Administered 2017-01-25 – 2017-01-27 (×3): 25 mg via ORAL
  Filled 2017-01-25 (×3): qty 1

## 2017-01-25 MED ORDER — PANTOPRAZOLE SODIUM 40 MG PO TBEC
40.0000 mg | DELAYED_RELEASE_TABLET | Freq: Every day | ORAL | Status: DC
  Administered 2017-01-25 – 2017-01-27 (×3): 40 mg via ORAL
  Filled 2017-01-25 (×3): qty 1

## 2017-01-25 MED ORDER — ALBUTEROL SULFATE (2.5 MG/3ML) 0.083% IN NEBU: 2.5000 mg | INHALATION_SOLUTION | Freq: Four times a day (QID) | RESPIRATORY_TRACT | Status: DC | PRN

## 2017-01-25 MED ORDER — STERILE WATER FOR INJECTION IV SOLN
INTRAVENOUS | Status: AC
  Administered 2017-01-25 (×2): via INTRAVENOUS
  Filled 2017-01-25 (×3): qty 850

## 2017-01-25 NOTE — Progress Notes (Addendum)
PROGRESS NOTE    [name] [name]  FYB:[id number] DOB: [birth year] DOA: 01/24/2017 PCP: [name], MD (Confirm with patient/family/NH records and if not entered, this HAS to be entered at [facility] point of entry. "No PCP" if truly none.)   Brief Narrative: (Start on day 1 of progress note - keep it brief and live) [name] [name] is a 76 y.o. female with hx of AAA, CKD 3, GI bleed, HTN, HL, ICM/ PAD/ CAD, DM2 who presented to ED with gen'd weakness and back pain.  Hx of arthritis in her back.  LS spine xrays were unremarkable.  Labs show pyuria on UA, and creat up at 2.47, baseline creat 1.6- 1.9.  Asked to see for admission.   Pt says she had a spinal epidural shot recently.  About a week ago her back pain started to flare up again.  Also she developed a head cold.  Her back pain kept getting worse and over the last 2 days has barely been able to walk.  Has been taking tylenol, occ takes vicodin but then ran out.  No fever or dysuria, had chills this morning first time.  No n/v, +some mild diarrhea.  No abd pain or CP, no SOB.  +odor to the urine.  Back pain is mid lower back w/o radiation.    She has a stent in the R ureter which gets changed about every 6 mos, next time due is Jan 2019.   Patient is divorced, has one daughter who just left a few min ago.  Pt lives in [place] and dtr lives in [place]. NO tob etoh.       Assessment & Plan:   Principal Problem:   Acute pyelonephritis Active Problems:   Diabetes mellitus, type II (HCC)   HTN (hypertension)   Normocytic anemia   Chronic combined systolic and diastolic congestive heart failure (HCC)   Chronic kidney disease, stage III (moderate) (HCC)   Acute kidney injury superimposed on chronic kidney disease (HCC)   PAD (peripheral artery disease) (HCC)   AKI (acute kidney injury) ([place])   1. UTI: being treated for pyelo.  No reported fevers or WBC, but reported chills.  UA with WBC's, RBC's, large LE's.  No clear CVA tenderness on  exam (maybe on L?).  Lower back pain with UTI may be more due to her chronic back pain? But for now treating as pyelonephritis. 1. Continue IV abx 2. Continue IVF  2. AKI on CKD 3/4  Hx bilateral hydroneprhosis s/p R ureteral Stent: L kidney minimally functional (01/2015 lasix renogram) - Baseline creatinine varies widely, from ~1.3 - ~1.9. 1. Renal US with dilated R intrarenal collecting system and pelvis with demnostration of R sided ureteral jet suggesting patency of R ureter (findings represent caliectasis rather than hydronephrosis).  Dilated L ureter but demonstrable L ureteral jet stream within bladder (patent but chronically dilated L ureter is suggested).  Also lobular simple L upper pole renal cyst.  Discussed with Dr. Louis Meckel who recommended continued treatment as we are for UTI and ctm renal function, outpatient f/u with urology unless not continuing to improve. 2. Currently receiving bicarb IVF 3. Caution with IVF given low EF  3. Acute on Chronic Back Pain  Gen'd weakness - gait difficulty 1. PT 2. Pain med prn  4. Vol depletion - hold lasix, IVF as above  5. CAD hx CABG and ICM (EF 40-45% from 04/2016 echo)  6. Severe PAD - hx aortobifem, fem-pop, bilat CEA  7.  DM2 - cont insulin , + SSI  8. Stage 2 sacral decub - foam dressing   DVT prophylaxis: lovenox Code Status: full  Family Communication: none at bedise Disposition Plan: pending improvement  Consultants:   [place] by phone  Procedures: (Don't include imaging studies which can be auto populated. Include things that cannot be auto populated i.e. Echo, Carotid and venous dopplers, Foley, Bipap, HD, tubes/drains, wound vac, central lines etc)  Renal US  Antimicrobials: (specify start and planned stop date. Auto populated tables are space occupying and do not give end dates) Anti-infectives (From admission, onward)   Start     Dose/Rate Route Frequency Ordered Stop   01/25/17 0200  cefTRIAXone (ROCEPHIN) 1 g in  dextrose 5 % 50 mL IVPB     1 g 100 mL/hr over 30 Minutes Intravenous Daily at bedtime 01/25/17 0149         Subjective: Pain in back.   No fevers, but did notice chills.  1 week ago had cortisone shot.  Can barely walk now she feels like.  No dysuria or frequency.  Objective: Vitals:   01/24/17 2319 01/25/17 0113 01/25/17 0244 01/25/17 0556  BP: (!) 129/57 (!) 148/72 (!) 143/47 (!) 124/50  Pulse: 66 66 69 62  Resp: 18 18 18 18   Temp: (!) 97.5 F (36.4 C) 97.8 F (36.6 C) 97.7 F (36.5 C) 97.8 F (36.6 C)  TempSrc: Oral Oral Oral Oral  SpO2: 100% 100% 98% 100%  Weight:  60.8 kg (134 lb 0.6 oz)  65.2 kg (143 lb 11.8 oz)  Height:  5\' 6"  (1.676 m)      Intake/Output Summary (Last 24 hours) at 01/25/2017 0904 Last data filed at 01/25/2017 0557 Gross per 24 hour  Intake -  Output 0 ml  Net 0 ml   Filed Weights   01/24/17 1621 01/25/17 0113 01/25/17 0556  Weight: 60.3 kg (133 lb) 60.8 kg (134 lb 0.6 oz) 65.2 kg (143 lb 11.8 oz)    Examination:  General exam: Appears calm and comfortable  Respiratory system: Clear to auscultation. Respiratory effort normal. Cardiovascular system: S1 & S2 heard, RRR. No JVD, murmurs, rubs, gallops or clicks. No pedal edema. Gastrointestinal system: Abdomen is nondistended, soft and nontender. No organomegaly or masses felt. Normal bowel sounds heard. Mild L sided CVA tenderness Central nervous system: Alert and oriented. No focal neurological deficits. Extremities: Symmetric 5 x 5 power to upper and lower extremities Skin: Stage 2 pressure ulcer to sacrum Psychiatry: Judgement and insight appear normal. Mood & affect appropriate.     Data Reviewed: I have personally reviewed following labs and imaging studies  CBC: Recent Labs  Lab 01/24/17 2057 01/25/17 0535  WBC 8.3 8.6  NEUTROABS 6.2  --   HGB 11.4* 10.2*  HCT 35.8* 32.6*  MCV 92.7 93.4  PLT 319 701   Basic Metabolic Panel: Recent Labs  Lab 01/24/17 2057  01/25/17 0535  NA 140 141  K 4.6 4.3  CL 118* 118*  CO2 14* 14*  GLUCOSE 112* 92  BUN 58* 48*  CREATININE 2.47* 1.95*  CALCIUM 9.3 8.3*   GFR: Estimated Creatinine Clearance: 23.3 mL/min (A) (by C-G formula based on SCr of 1.95 mg/dL (H)). Liver Function Tests: No results for input(s): AST, ALT, ALKPHOS, BILITOT, PROT, ALBUMIN in the last 168 hours. No results for input(s): LIPASE, AMYLASE in the last 168 hours. No results for input(s): AMMONIA in the last 168 hours. Coagulation Profile: No results for input(s): INR, PROTIME  in the last 168 hours. Cardiac Enzymes: No results for input(s): CKTOTAL, CKMB, CKMBINDEX, TROPONINI in the last 168 hours. BNP (last 3 results) Recent Labs    02/09/16 1334  PROBNP 119.0*   HbA1C: No results for input(s): HGBA1C in the last 72 hours. CBG: Recent Labs  Lab 01/25/17 0320 01/25/17 0729  GLUCAP 92 82   Lipid Profile: No results for input(s): CHOL, HDL, LDLCALC, TRIG, CHOLHDL, LDLDIRECT in the last 72 hours. Thyroid Function Tests: No results for input(s): TSH, T4TOTAL, FREET4, T3FREE, THYROIDAB in the last 72 hours. Anemia Panel: No results for input(s): VITAMINB12, FOLATE, FERRITIN, TIBC, IRON, RETICCTPCT in the last 72 hours. Sepsis Labs: No results for input(s): PROCALCITON, LATICACIDVEN in the last 168 hours.  No results found for this or any previous visit (from the past 240 hour(s)).       Radiology Studies: Dg Lumbar Spine Complete  Result Date: 01/24/2017 CLINICAL DATA:  Low back pain.  Worsening back pain EXAM: LUMBAR SPINE - COMPLETE 4+ VIEW COMPARISON:  X-ray and 116 18 FINDINGS: Again demonstrated RIGHT double-J ureteral stent. The inferior aspect the stent is not imaged Normal alignment of lumbar vertebral bodies. No loss of vertebral body height or disc height. No pars fracture. No subluxation. Pleurodesis noted in the LEFT lung base. IMPRESSION: 1. No acute findings lumbar spine. 2. Extensive vascular  atherosclerotic calcification. 3. RIGHT ureteral stent in place Electronically Signed   By: Suzy Bouchard M.D.   On: 01/24/2017 20:31   US Renal  Result Date: 01/24/2017 CLINICAL DATA:  Acute on chronic renal failure. History of ureteral stents. EXAM: RENAL / URINARY TRACT ULTRASOUND COMPLETE COMPARISON:  01/09/2015 FINDINGS: Right Kidney: Length: 9.5 cm. Moderate dilatation of the intrarenal collecting system of the right kidney with moderate prominence of the right renal pelvis. Findings would be in keeping chronic caliectasis given bilateral ureteral jets demonstrated on images of the bladder. No nephrolithiasis or focal mass. Renal cortical thinning to 5-6 mm in thickness. Left Kidney: Length: 9.4 cm. Thinning of the left renal cortex with a 2.3 x 3.3 x 3.5 cm lobular cyst in the upper pole. Renal cortical thinning with loss cortical-medullary distinction. Moderate-to-marked dilatation of the left ureter is noted with demonstrated left ureteral jet suggesting patency. Bladder: Appears normal for degree of bladder distention. IMPRESSION: 1. Dilated right intrarenal collecting system and pelvis with demonstration of right-sided ureteral jet suggesting patency of the right ureter. Findings likely represent caliectasis as opposed to hydronephrosis. 2. Lobular simple appearing left upper pole renal cyst measuring 2.3 x 3.3 x 3.5 cm. 3. Dilated left ureter but given a demonstrable left ureteral jet stream within the bladder, a patent but chronically dilated left ureter is suggested. 4. Cortical thinning of the right kidney with loss of cortical-medullary distinction of the left kidney. Electronically Signed   By: Ashley Royalty M.D.   On: 01/24/2017 23:22        Scheduled Meds: . aspirin  81 mg Oral Daily  . enoxaparin (LOVENOX) injection  30 mg Subcutaneous Q24H  . insulin detemir  10 Units Subcutaneous QHS  . isosorbide mononitrate  30 mg Oral Daily  . latanoprost  1 drop Both Eyes QHS  .  levothyroxine  88 mcg Oral QAC breakfast  . metoprolol succinate  25 mg Oral Daily  . omega-3 acid ethyl esters  1 g Oral BID  . pantoprazole  40 mg Oral Daily  . rosuvastatin  20 mg Oral Daily  . vitamin B-12  1,000 mcg  Oral Daily  . vitamin C  500 mg Oral QHS   Continuous Infusions: . cefTRIAXone (ROCEPHIN)  IV Stopped (01/25/17 0333)  .  sodium bicarbonate (isotonic) infusion in sterile water 75 mL/hr at 01/25/17 0304     LOS: 0 days    Time spent: over 49 min    Fayrene Helper, MD [facility] Pager [phone]  If 7PM-7AM, please contact night-coverage [email or web address] Password [email or web address] 01/25/2017, 9:04 AM

## 2017-01-25 NOTE — ED Notes (Signed)
New room [phone]

## 2017-01-25 NOTE — Evaluation (Signed)
Physical Therapy Evaluation Patient Details Name: [name] [name] MRN: [id number] DOB: 11-06-1940 Today's Date: 01/25/2017   History of Present Illness  76 yo female with H/O HTN, DM, CHF, right ureteral stent admitted for  back pain, unrelieved by various methods, including epidural.  Clinical Impression  *the patient ambulated with min guard a short distance in the room using RW.  Increased pain complaints with being up ambulating. Pt admitted with above diagnosis. Pt currently with functional limitations due to the deficits listed below (see PT Problem List).  Pt will benefit from skilled PT to increase their independence and safety with mobility to allow discharge to the venue listed below.     Follow Up Recommendations Home health PT    Equipment Recommendations  None recommended by PT    Recommendations for Other Services       Precautions / Restrictions Precautions Precautions: Fall Required Braces or Orthoses: Spinal Brace Spinal Brace: Other (comment) Spinal Brace Comments: sounds like a corset that she wears, not in room      Mobility  Bed Mobility Overal bed mobility: Needs Assistance Bed Mobility: Supine to Sit;Sit to Supine     Supine to sit: Supervision;HOB elevated Sit to supine: Supervision;HOB elevated   General bed mobility comments: use of rails, no physical assist.  Transfers Overall transfer level: Needs assistance Equipment used: Rolling walker (2 wheeled) Transfers: Sit to/from Stand Sit to Stand: Supervision         General transfer comment: from bed and recliner  Ambulation/Gait Ambulation/Gait assistance: Min guard Ambulation Distance (Feet): 25 Feet Assistive device: Rolling walker (2 wheeled) Gait Pattern/deviations: Step-through pattern     General Gait Details: ambulated to [facility] x 8' then around room with RW. reports pain begins to worsen with time upright.   Stairs            Wheelchair Mobility    Modified Rankin  (Stroke Patients Only)       Balance                                             Pertinent Vitals/Pain Pain Assessment: 0-10 Pain Score: 7  Pain Location: from 0 to 7 with activity Pain Descriptors / Indicators: Aching;Shooting Pain Intervention(s): Monitored during session;Premedicated before session;Repositioned    Home Living Family/patient expects to be discharged to:: Private residence Living Arrangements: Alone;Children Available Help at Discharge: Family Type of Home: House Home Access: Stairs to enter     Home Layout: One level Home Equipment: Environmental consultant - 2 wheels;Walker - 4 wheels;Bedside commode;Tub bench;Grab bars - tub/shower;Hand held shower head Additional Comments: plan to go to daughters upon discharge for a few days, daughter  goes home at lunch, works 5 minutes from daughter's home where patient  plans to go,    Prior Function Level of Independence: Needs assistance   Gait / Transfers Assistance Needed: has been using [place], limited getting out of house.  states 20' is max distance that she tolerates/           Hand Dominance        Extremity/Trunk Assessment   Upper Extremity Assessment Upper Extremity Assessment: Generalized weakness    Lower Extremity Assessment Lower Extremity Assessment: Generalized weakness    Cervical / Trunk Assessment Cervical / Trunk Assessment: Kyphotic  Communication      Cognition Arousal/Alertness: Awake/alert Behavior During Therapy: [facility] for  tasks assessed/performed Overall Cognitive Status: Within Functional Limits for tasks assessed                                        General Comments      Exercises     Assessment/Plan    PT Assessment Patient needs continued PT services  PT Problem List Decreased activity tolerance;Decreased balance;Decreased mobility;Decreased knowledge of precautions;Decreased safety awareness;Pain;Decreased knowledge of use of DME       PT  Treatment Interventions DME instruction;Functional mobility training;Gait training;Patient/family education;Therapeutic activities    PT Goals (Current goals can be found in the Care Plan section)  Acute Rehab PT Goals Patient Stated Goal: to get some relief , then go to my daughter's house PT Goal Formulation: With patient Time For Goal Achievement: 02/01/17 Potential to Achieve Goals: Good    Frequency Min 2X/week   Barriers to discharge Decreased caregiver support      Co-evaluation               AM-PAC PT "6 Clicks" Daily Activity  Outcome Measure Difficulty turning over in bed (including adjusting bedclothes, sheets and blankets)?: A Little Difficulty moving from lying on back to sitting on the side of the bed? : A Little Difficulty sitting down on and standing up from a chair with arms (e.g., wheelchair, bedside commode, etc,.)?: A Little Help needed moving to and from a bed to chair (including a wheelchair)?: A Little Help needed walking in hospital room?: A Lot Help needed climbing 3-5 steps with a railing? : A Lot 6 Click Score: 16    End of Session   Activity Tolerance: Patient limited by pain Patient left: with call bell/phone within reach;with bed alarm set Nurse Communication: Mobility status PT Visit Diagnosis: Unsteadiness on feet (R26.81)    Time: 4158-3094 PT Time Calculation (min) (ACUTE ONLY): 16 min   Charges:   PT Evaluation $PT Eval Low Complexity: 1 Low     PT G Codes:   PT G-Codes **NOT FOR INPATIENT CLASS** Functional Assessment Tool Used: AM-PAC 6 Clicks Basic Mobility;Clinical judgement Functional Limitation: Mobility: Walking and moving around Mobility: Walking and Moving Around Current Status (M7680): At least 20 percent but less than 40 percent impaired, limited or restricted Mobility: Walking and Moving Around Goal Status [phone]): At least 1 percent but less than 20 percent impaired, limited or restricted    [facility]  PT [phone] }  Claretha Cooper 01/25/2017, 2:04 PM

## 2017-01-26 DIAGNOSIS — N3001 Acute cystitis with hematuria: Secondary | ICD-10-CM

## 2017-01-26 LAB — BASIC METABOLIC PANEL
ANION GAP: 5 (ref 5–15)
BUN: 35 mg/dL — AB (ref 6–20)
CHLORIDE: 109 mmol/L (ref 101–111)
CO2: 26 mmol/L (ref 22–32)
Calcium: 8.2 mg/dL — ABNORMAL LOW (ref 8.9–10.3)
Creatinine, Ser: 1.89 mg/dL — ABNORMAL HIGH (ref 0.44–1.00)
GFR calc Af Amer: 29 mL/min — ABNORMAL LOW (ref >60)
GFR calc non Af Amer: 25 mL/min — ABNORMAL LOW (ref >60)
Glucose, Bld: 95 mg/dL (ref 65–99)
POTASSIUM: 4.1 mmol/L (ref 3.5–5.1)
Sodium: 140 mmol/L (ref 135–145)

## 2017-01-26 LAB — CBC
HEMATOCRIT: 31.1 % — AB (ref 36.0–46.0)
HEMOGLOBIN: 10 g/dL — AB (ref 12.0–15.0)
MCH: 29.6 pg (ref 26.0–34.0)
MCHC: 32.2 g/dL (ref 30.0–36.0)
MCV: 92 fL (ref 78.0–100.0)
PLATELETS: 250 10*3/uL (ref 150–400)
RBC: 3.38 MIL/uL — AB (ref 3.87–5.11)
RDW: 16.4 % — ABNORMAL HIGH (ref 11.5–15.5)
WBC: 7.1 10*3/uL (ref 4.0–10.5)

## 2017-01-26 LAB — URINE CULTURE: CULTURE: NO GROWTH

## 2017-01-26 LAB — GLUCOSE, CAPILLARY
GLUCOSE-CAPILLARY: 126 mg/dL — AB (ref 65–99)
GLUCOSE-CAPILLARY: 100 mg/dL — AB (ref 65–99)
GLUCOSE-CAPILLARY: 145 mg/dL — AB (ref 65–99)
Glucose-Capillary: 124 mg/dL — ABNORMAL HIGH (ref 65–99)

## 2017-01-26 MED ORDER — LIDOCAINE 5 % EX PTCH
1.0000 | MEDICATED_PATCH | CUTANEOUS | Status: DC
  Administered 2017-01-26 – 2017-01-27 (×2): 1 via TRANSDERMAL
  Filled 2017-01-26 (×3): qty 1

## 2017-01-26 NOTE — Progress Notes (Signed)
Discharge planning, spoke with patient and spouse at beside. Chose [facility] for [facility] services, contacted [facility] for referral. Has RW and 3-n-. [phone]

## 2017-01-26 NOTE — Progress Notes (Signed)
PROGRESS NOTE    CHRISTEENA [name]  SWN:[id number] DOB: [birth year] DOA: 01/24/2017 PCP: [name], MD Brief Narrative: [name] [name] is a 76 y.o. female with hx of AAA, CKD 3, GI bleed, HTN, HL, ICM/ PAD/ CAD, DM2 who presented to ED with gen'd weakness and back pain.  Hx of arthritis in her back.  LS spine xrays were unremarkable.  Labs show pyuria on UA, and creat up at 2.47, baseline creat 1.6- 1.9.  -Patient has lumbar spinal stenosis predominantly at L4-L5, followed by Dr. Almedia Balls at [place], she had an epidural spinal injection 1 week prior to admission. -Presented to the emergency room with weakness, worsening back pain, difficulty ambulating in addition she also ran out of her Vicodin. In the emergency room noted to have worsening kidney function with creatinine of 2.7 and possible urinary infection, off note she has a stent in her right ureter which is changed every 6 months, next due January 2019  Assessment & Plan:   1. Acute on chronic low back pain - Long-standing history of lumbar spinal stenosis - Followed by Dr.VOytek -orthopedics, status post epidural spinal injection - X-rays are stable, MRI from 5 months ago reviewed, showed severe L4-L5 spinal stenosis - Per discussion she is felt to be a poor surgical candidate due to multitude of medical problems - Continue oxycodone per home regimen, add lidocaine patch - Home physical therapy  2. Possible UTI/ suspected pyelonephritis on admission -Urine cultures are negative, will stop antibiotics and monitor today -Follow-up follow-up with urology for stent change January 2019  3. 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  Dilated L ureter but demonstrable L ureteral jet stream within bladder (patent but chronically dilated L ureter is suggested).  Also lobular simple L upper pole renal cyst. Dr. Florene Glen discussed these findings with urologist Dr. Louis Meckel who recommended continued treatment, recommended to continue current treatment and follow-up with urology -Creatinine improving, will cut down IV fluids today  4 . Vol depletion -Lasix held, adequately hydrated will cut down IV fluids today -Resume by mouth Lasix tomorrow if stable  5. CAD hx CABG and ICM (EF 40-45% from 04/2016 echo) -Euvolemic, presumably 6 tomorrow stable -Continue aspirin and statin  6. Severe PAD - hx aortobifem, fem-pop, bilat CEA  -Continue aspirin and statin  7. DM2 - cont insulin , + SSI -Stable  8. stage 2 sacral decub - foam dressing   DVT prophylaxis: lovenox Code Status: full  Family Communication: No family at bedside Disposition Plan: Home with home health tomorrow if stable  Consultants:   [place] by phone  Procedures:   Renal US  Antimicrobials: Anti-infectives (From admission, onward)   Start     Dose/Rate Route Frequency Ordered Stop   01/25/17 0200  cefTRIAXone (ROCEPHIN) 1 g in dextrose 5 % 50 mL IVPB  Status:  Discontinued     1 g 100 mL/hr over 30 Minutes Intravenous Daily at bedtime 01/25/17 0149 01/26/17 1133       Subjective: Pain in back.   No fevers, but did notice chills.  1 week ago had cortisone shot.  Can barely walk now she feels like.  No dysuria or  frequency.  Objective: Vitals:   01/25/17 1400 01/25/17 2106 01/26/17 0538 01/26/17 0953  BP: 129/64 (!) 150/65 (!) 147/66 (!) 138/51  Pulse: 65 67 74 69  Resp: 18 17 18    Temp: (!) 97.5 F (36.4 C) 98.2 F (36.8 C) 97.7 F (36.5 C)   TempSrc: Oral Oral Oral   SpO2: 100% 100% 99%   Weight:      Height:        Intake/Output Summary (Last 24 hours) at  01/26/2017 1349 Last data filed at 01/26/2017 1000 Gross per 24 hour  Intake 890 ml  Output 200 ml  Net 690 ml   Filed Weights   01/24/17 1621 01/25/17 0113 01/25/17 0556  Weight: 60.3 kg (133 lb) 60.8 kg (134 lb 0.6 oz) 65.2 kg (143 lb 11.8 oz)    Examination:  General exam: Appears calm and comfortable  Respiratory system: Clear to auscultation. Respiratory effort normal. Cardiovascular system: S1 & S2 heard, RRR. No JVD, murmurs, rubs, gallops or clicks. No pedal edema. Gastrointestinal system: Abdomen is nondistended, soft and nontender. No organomegaly or masses felt. Normal bowel sounds heard. Mild L sided CVA tenderness Central nervous system: Alert and oriented. No focal neurological deficits. Extremities: Symmetric 5 x 5 power to upper and lower extremities Skin: Stage 2 pressure ulcer to sacrum Psychiatry: Judgement and insight appear normal. Mood & affect appropriate.     Data Reviewed: I have personally reviewed following labs and imaging studies  CBC: Recent Labs  Lab 01/24/17 2057 01/25/17 0535 01/26/17 0617  WBC 8.3 8.6 7.1  NEUTROABS 6.2  --   --   HGB 11.4* 10.2* 10.0*  HCT 35.8* 32.6* 31.1*  MCV 92.7 93.4 92.0  PLT 319 250 500   Basic Metabolic Panel: Recent Labs  Lab 01/24/17 2057 01/25/17 0535 01/26/17 0617  NA 140 141 140  K 4.6 4.3 4.1  CL 118* 118* 109  CO2 14* 14* 26  GLUCOSE 112* 92 95  BUN 58* 48* 35*  CREATININE 2.47* 1.95* 1.89*  CALCIUM 9.3 8.3* 8.2*   GFR: Estimated Creatinine Clearance: 24.1 mL/min (A) (by C-G formula based on SCr of 1.89 mg/dL (H)). Liver Function Tests: No results for input(s): AST, ALT, ALKPHOS, BILITOT, PROT, ALBUMIN in the last 168 hours. No results for input(s): LIPASE, AMYLASE in the last 168 hours. No results for input(s): AMMONIA in the last 168 hours. Coagulation Profile: No results for input(s): INR, PROTIME in the last 168 hours. Cardiac Enzymes: No results for input(s): CKTOTAL, CKMB,  CKMBINDEX, TROPONINI in the last 168 hours. BNP (last 3 results) Recent Labs    02/09/16 1334  PROBNP 119.0*   HbA1C: No results for input(s): HGBA1C in the last 72 hours. CBG: Recent Labs  Lab 01/25/17 1154 01/25/17 1659 01/25/17 2110 01/26/17 0736 01/26/17 1150  GLUCAP 106* 120* 139* 126* 100*   Lipid Profile: No results for input(s): CHOL, HDL, LDLCALC, TRIG, CHOLHDL, LDLDIRECT in the last 72 hours. Thyroid Function Tests: No results for input(s): TSH, T4TOTAL, FREET4, T3FREE, THYROIDAB in the last 72 hours. Anemia Panel: No results for input(s): VITAMINB12, FOLATE, FERRITIN, TIBC, IRON, RETICCTPCT in the last 72 hours. Sepsis Labs: No results for input(s): PROCALCITON, LATICACIDVEN in the last 168 hours.  Recent Results (from the past 240 hour(s))  Culture, Urine     Status: None   Collection Time: 01/25/17  9:30 AM  Result Value Ref Range Status   Specimen Description URINE, CLEAN CATCH  Final   Special Requests NONE  Final   Culture   Final    NO GROWTH Performed at [facility], [place] [address], [place], [place] [address]    Report Status 01/26/2017 FINAL  Final         Radiology Studies: Dg Lumbar Spine Complete  Result Date: 01/24/2017 CLINICAL DATA:  Low back pain.  Worsening back pain EXAM: LUMBAR SPINE - COMPLETE 4+ VIEW COMPARISON:  X-ray and 116 18 FINDINGS: Again demonstrated RIGHT double-J ureteral stent. The inferior aspect the stent is not imaged Normal alignment of lumbar vertebral bodies. No loss of vertebral body height or disc height. No pars fracture. No subluxation. Pleurodesis noted in the LEFT lung base. IMPRESSION: 1. No acute findings lumbar spine. 2. Extensive vascular atherosclerotic calcification. 3. RIGHT ureteral stent in place Electronically Signed   By: Suzy Bouchard M.D.   On: 01/24/2017 20:31   US Renal  Result Date: 01/24/2017 CLINICAL DATA:  Acute on chronic renal failure. History of ureteral stents. EXAM: RENAL /  URINARY TRACT ULTRASOUND COMPLETE COMPARISON:  01/09/2015 FINDINGS: Right Kidney: Length: 9.5 cm. Moderate dilatation of the intrarenal collecting system of the right kidney with moderate prominence of the right renal pelvis. Findings would be in keeping chronic caliectasis given bilateral ureteral jets demonstrated on images of the bladder. No nephrolithiasis or focal mass. Renal cortical thinning to 5-6 mm in thickness. Left Kidney: Length: 9.4 cm. Thinning of the left renal cortex with a 2.3 x 3.3 x 3.5 cm lobular cyst in the upper pole. Renal cortical thinning with loss cortical-medullary distinction. Moderate-to-marked dilatation of the left ureter is noted with demonstrated left ureteral jet suggesting patency. Bladder: Appears normal for degree of bladder distention. IMPRESSION: 1. Dilated right intrarenal collecting system and pelvis with demonstration of right-sided ureteral jet suggesting patency of the right ureter. Findings likely represent caliectasis as opposed to hydronephrosis. 2. Lobular simple appearing left upper pole renal cyst measuring 2.3 x 3.3 x 3.5 cm. 3. Dilated left ureter but given a demonstrable left ureteral jet stream within the bladder, a patent but chronically dilated left ureter is suggested. 4. Cortical thinning of the right kidney with loss of cortical-medullary distinction of the left kidney. Electronically Signed   By: Ashley Royalty M.D.   On: 01/24/2017 23:22        Scheduled Meds: . aspirin  81 mg Oral Daily  . enoxaparin (LOVENOX) injection  30 mg Subcutaneous Q24H  . insulin detemir  10 Units Subcutaneous QHS  . isosorbide mononitrate  30 mg Oral Daily  . latanoprost  1 drop Both Eyes QHS  . levothyroxine  88 mcg Oral QAC breakfast  . lidocaine  1 patch Transdermal Q24H  . metoprolol succinate  25 mg Oral Daily  . omega-3 acid ethyl esters  1 g Oral BID  . pantoprazole  40 mg Oral Daily  . rosuvastatin  20 mg Oral Daily  . vitamin B-12  1,000 mcg Oral Daily    . vitamin C  500 mg Oral QHS   Continuous Infusions:    LOS: 1 day    Time spent: over 30 min    Domenic Polite, MD [facility] Page via [email or web address], password [email or web address]  If 7PM-7AM, please contact night-coverage [email or web address] Password [email or web address] 01/26/2017, 1:49 PM

## 2017-01-27 LAB — BASIC METABOLIC PANEL
ANION GAP: 9 (ref 5–15)
BUN: 33 mg/dL — AB (ref 6–20)
CO2: 26 mmol/L (ref 22–32)
Calcium: 8.6 mg/dL — ABNORMAL LOW (ref 8.9–10.3)
Chloride: 106 mmol/L (ref 101–111)
Creatinine, Ser: 1.71 mg/dL — ABNORMAL HIGH (ref 0.44–1.00)
GFR calc Af Amer: 33 mL/min — ABNORMAL LOW (ref >60)
GFR, EST NON AFRICAN AMERICAN: 28 mL/min — AB (ref >60)
Glucose, Bld: 107 mg/dL — ABNORMAL HIGH (ref 65–99)
POTASSIUM: 4 mmol/L (ref 3.5–5.1)
SODIUM: 141 mmol/L (ref 135–145)

## 2017-01-27 MED ORDER — HYDROCODONE-ACETAMINOPHEN 5-325 MG PO TABS: 1.0000 | ORAL_TABLET | Freq: Four times a day (QID) | ORAL | 0 refills | Status: DC | PRN

## 2017-01-27 MED ORDER — INSULIN DETEMIR 100 UNIT/ML ~~LOC~~ SOLN: 10.0000 [IU] | Freq: Every day | SUBCUTANEOUS | Status: DC

## 2017-01-27 MED ORDER — INSULIN ASPART 100 UNIT/ML FLEXPEN: 3.0000 [IU] | PEN_INJECTOR | Freq: Three times a day (TID) | SUBCUTANEOUS | Status: DC

## 2017-01-27 MED ORDER — LIDOCAINE 5 % EX PTCH: 1.0000 | MEDICATED_PATCH | CUTANEOUS | 0 refills | Status: DC

## 2017-01-27 NOTE — Progress Notes (Signed)
DC instructions reviewed with patient and daughter. Daughters number provided to [facility] for follow up. All questions followed up and answered. IV removed. Lidocaine patch placed prior to DC. Education provided. Patient and daughter express understanding.

## 2017-01-27 NOTE — Discharge Summary (Signed)
Physician Discharge Summary  [name] [name] JSH:[id number] DOB: [birth year] DOA: 01/24/2017  PCP: [name], MD  Admit date: 01/24/2017 Discharge date: 01/27/2017  Time spent: 35 minutes  Recommendations for Outpatient Follow-up:  1. PCP Dr.Kumar in 1 week, please check Bmet at Follow up 2. Orthopedics Dr.Anna VOytek in 2weeks 3. Home health PT 4. FU with Urology in January for stent exchange   Discharge Diagnoses:    Low back pain   Diabetes mellitus, type II ([place])   HTN (hypertension)   Normocytic anemia   Chronic combined systolic and diastolic congestive heart failure (HCC)   Chronic kidney disease, stage III (moderate) (HCC)   Acute kidney injury superimposed on chronic kidney disease ([place])   PAD (peripheral artery disease) ([place])   AKI (acute kidney injury) ([place])   Ureteral stent  Discharge Condition: stable  Diet recommendation: Diabetic, low sodium  Filed Weights   01/24/17 1621 01/25/17 0113 01/25/17 0556  Weight: 60.3 kg (133 lb) 60.8 kg (134 lb 0.6 oz) 65.2 kg (143 lb 11.8 oz)    History of present illness:  [name] [name] a 76 y.o.femalewith hx of AAA, CKD 3, GI bleed, HTN, HL, ICM/ PAD/ CAD, DM2 who presented to ED with gen'd weakness and back pain. Hx of arthritis in her back. LS spine xrays were unremarkable. Labs show pyuria on UA, and creat up at 2.47, baseline creat 1.6- 1.9.  -Patient has lumbar spinal stenosis predominantly at L4-L5, followed by Dr. Almedia Balls at [facility], she had an epidural spinal injection 1 week prior to admission. -Presented to the emergency room with weakness, worsening back pain, difficulty ambulating in addition she also ran out of her Vicodin.   Hospital Course:  1. Acute on chronic low back pain - Long-standing history of lumbar spinal stenosis - Followed by Dr.VOytek -orthopedics, status post epidural spinal injection x2 - X-rays are stable, MRI from 5 months ago reviewed, showed severe L4-L5  spinal stenosis - Per discussion she is felt to be a poor surgical candidate due to multitude of medical problems - Continue oxycodone per home regimen, added lidocaine patch with good effect -Physical therapy evaluation completed home physical therapy set up at discharge  2. Possible UTI/ pyelonephritis suspected on admission -Urine cultures negative, stopped antibiotics yesterday -Follow-up follow-up with urology for stent change January 2019  3. AKI on CKD 3/4 with bilateral hydronephrosis and right ureteral stent -Her left kidney is minimally functional based on Lasix renogram in 01/2015 -Baseline creatinine varies widely between 1.3 and 1.9 -Renal US with dilated R intrarenal collecting system and pelvis with demnostration of R sided ureteral jet suggesting patency of R ureter (findings represent caliectasis rather than hydronephrosis).  Dilated L ureter but demonstrable L ureteral jet stream within bladder (patent but chronically dilated L ureter is suggested).  Dr. Florene Glen discussed these findings with urologist Dr. Louis Meckel who recommended  to continue current treatment and follow-up with urology -Creatinine improved, back to baseline, stopped IVF yesterday, resume lasix 20mg  daily  4 . Vol depletion -Lasix held, adequately hydrated , then stopped IVF -Resumed PO Lasix today at discharge  5. CAD hx CABG and ICM (EF 40-45% from 04/2016 echo) -Euvolemic, presumably 6 tomorrow stable -Continue aspirin and statin  6. Severe PAD - hx aortobifem, fem-pop, bilat CEA  -Continue aspirin and statin  7. DM2 - cont insulin , + SSI per home regimen -Stable  8. stage 2 sacral decub - foam dressing    Consultations:  Discussed with urology  Dr.Herrick  Discharge Exam: Vitals:   01/26/17 2115 01/27/17 0559  BP: (!) 126/53 138/64  Pulse: 65 60  Resp: 18 18  Temp: 98.7 F (37.1 C) 98 F (36.7 C)  SpO2: 100% 98%    General: AAOx3 Cardiovascular: S1S2/RRR Respiratory:  CTAB  Discharge Instructions   Discharge Instructions    Diet - low sodium heart healthy   Complete by:  As directed    Diet Carb Modified   Complete by:  As directed    Increase activity slowly   Complete by:  As directed      Discharge Medication List as of 01/27/2017 10:43 AM    START taking these medications   Details  lidocaine (LIDODERM) 5 % Place 1 patch onto the skin daily. Remove & Discard patch within 12 hours or as directed by MD, Starting Thu 01/27/2017, Print      CONTINUE these medications which have CHANGED   Details  insulin aspart (NOVOLOG FLEXPEN) 100 UNIT/ML FlexPen Inject 3 Units into the skin 3 (three) times daily with meals., Starting Thu 01/27/2017, No Print    insulin detemir (LEVEMIR) 100 UNIT/ML injection Inject 0.1 mLs (10 Units total) into the skin at bedtime., Starting Thu 01/27/2017, No Print    HYDROcodone-acetaminophen (NORCO/VICODIN) 5-325 MG tablet Take 1 tablet by mouth every 6 (six) hours as needed for moderate pain or severe pain. Take 1 tablet every 12 hours as needed for back pain, Starting Thu 01/27/2017, Print      CONTINUE these medications which have NOT CHANGED   Details  albuterol (PROVENTIL HFA;VENTOLIN HFA) 108 (90 Base) MCG/ACT inhaler Inhale 2 puffs every 6 (six) hours as needed into the lungs for wheezing or shortness of breath., Starting Mon 01/10/2017, Normal    aspirin 81 MG chewable tablet Chew 81 mg by mouth daily. , Historical Med    calcium-vitamin D (OSCAL WITH D) 500-200 MG-UNIT per tablet Take 1 tablet by mouth every morning., Historical Med    ferrous sulfate 325 (65 FE) MG tablet Take 650 mg by mouth every evening. , Historical Med    furosemide (LASIX) 20 MG tablet TAKE 1 TABLET BY MOUTH ONCE DAILY, Normal    isosorbide mononitrate (IMDUR) 30 MG 24 hr tablet Take 1 tablet (30 mg total) daily by mouth., Starting Mon 01/10/2017, Normal    latanoprost (XALATAN) 0.005 % ophthalmic solution Place 1 drop into both eyes  at bedtime., Starting Mon 03/11/2014, Historical Med    levothyroxine (SYNTHROID, LEVOTHROID) 88 MCG tablet Take 1 tablet (88 mcg total) by mouth daily., Starting Fri 11/12/2016, Normal    metoprolol succinate (TOPROL-XL) 25 MG 24 hr tablet TAKE 1 TABLET DAILY, Normal    omega-3 acid ethyl esters (LOVAZA) 1 g capsule Take 1 capsule (1 g total) by mouth 2 (two) times daily., Starting Tue 09/28/2016, Normal    omeprazole (PRILOSEC) 20 MG capsule TAKE 1 CAPSULE DAILY, Normal    rosuvastatin (CRESTOR) 20 MG tablet Take 1 tablet (20 mg total) by mouth daily. Take 1 tablet daily, Starting Tue 05/25/2016, Normal    vitamin B-12 (CYANOCOBALAMIN) 1000 MCG tablet Take 1,000 mcg by mouth daily., Historical Med    vitamin C (ASCORBIC ACID) 500 MG tablet Take 500 mg by mouth at bedtime. , Historical Med    B-D ULTRAFINE III SHORT PEN 31G X 8 MM MISC USE TO INJECT INSULIN FOUR TIMES DAILY, Normal    glucose blood (ONE TOUCH ULTRA TEST) test strip Use to test blood sugar 1-2 times daily  Dx code- E11.9, Print    polyethylene glycol (MIRALAX / GLYCOLAX) packet Take 17 g by mouth daily as needed for moderate constipation., Starting Mon 09/08/2015, Normal    Vitamin D, Ergocalciferol, (DRISDOL) 50000 units CAPS capsule TAKE 1 CAPSULE BY MOUTH EVERY 14 DAYS, Normal       No Known Allergies Follow-up Information    [facility].   Specialty:  [place] Why:  physical therapy and bath aide Contact information: [address] [place] [address] [phone]        [name], MD. Schedule an appointment as soon as possible for a visit in 1 week(s).   Specialty:  Endocrinology Contact information: [place] [address] [phone]        Almedia Balls, MD. Schedule an appointment as soon as possible for a visit in 2 week(s).   Specialty:  Orthopedic Surgery Contact information: [place] [address] [place] [address] [phone]            The results of significant diagnostics from this hospitalization (including imaging, microbiology, ancillary and laboratory) are listed below for reference.    Significant Diagnostic Studies: Dg Lumbar Spine Complete  Result Date: 01/24/2017 CLINICAL DATA:  Low back pain.  Worsening back pain EXAM: LUMBAR SPINE - COMPLETE 4+ VIEW COMPARISON:  X-ray and 116 18 FINDINGS: Again demonstrated RIGHT double-J ureteral stent. The inferior aspect the stent is not imaged Normal alignment of lumbar vertebral bodies. No loss of vertebral body height or disc height. No pars fracture. No subluxation. Pleurodesis noted in the LEFT lung base. IMPRESSION: 1. No acute findings lumbar spine. 2. Extensive vascular atherosclerotic calcification. 3. RIGHT ureteral stent in place Electronically Signed   By: Suzy Bouchard M.D.   On: 01/24/2017 20:31   US Renal  Result Date: 01/24/2017 CLINICAL DATA:  Acute on chronic renal failure. History of ureteral stents. EXAM: RENAL / URINARY TRACT ULTRASOUND COMPLETE COMPARISON:  01/09/2015 FINDINGS: Right Kidney: Length: 9.5 cm. Moderate dilatation of the intrarenal collecting system of the right kidney with moderate prominence of the right renal pelvis. Findings would be in keeping chronic caliectasis given bilateral ureteral jets demonstrated on images of the bladder. No nephrolithiasis or focal mass. Renal cortical thinning to 5-6 mm in thickness. Left Kidney: Length: 9.4 cm. Thinning of the left renal cortex with a 2.3 x 3.3 x 3.5 cm lobular cyst in the upper pole. Renal cortical thinning with loss cortical-medullary distinction. Moderate-to-marked dilatation of the left ureter is noted with demonstrated left ureteral jet suggesting patency. Bladder: Appears normal for degree of bladder distention. IMPRESSION: 1. Dilated right intrarenal collecting system and pelvis with demonstration of right-sided ureteral jet suggesting patency of the  right ureter. Findings likely represent caliectasis as opposed to hydronephrosis. 2. Lobular simple appearing left upper pole renal cyst measuring 2.3 x 3.3 x 3.5 cm. 3. Dilated left ureter but given a demonstrable left ureteral jet stream within the bladder, a patent but chronically dilated left ureter is suggested. 4. Cortical thinning of the right kidney with loss of cortical-medullary distinction of the left kidney. Electronically Signed   By: Ashley Royalty M.D.   On: 01/24/2017 23:22    Microbiology: Recent Results (from the past 240 hour(s))  Culture, Urine     Status: None   Collection Time: 01/25/17  9:30 AM  Result Value Ref Range Status   Specimen Description URINE, CLEAN CATCH  Final  Special Requests NONE  Final   Culture   Final    NO GROWTH Performed at [facility], [place] [address] [place], [place] [id number]    Report Status 01/26/2017 FINAL  Final     Labs: Basic Metabolic Panel: Recent Labs  Lab 01/24/17 2057 01/25/17 0535 01/26/17 0617 01/27/17 0556  NA 140 141 140 141  K 4.6 4.3 4.1 4.0  CL 118* 118* 109 106  CO2 14* 14* 26 26  GLUCOSE 112* 92 95 107*  BUN 58* 48* 35* 33*  CREATININE 2.47* 1.95* 1.89* 1.71*  CALCIUM 9.3 8.3* 8.2* 8.6*   Liver Function Tests: No results for input(s): AST, ALT, ALKPHOS, BILITOT, PROT, ALBUMIN in the last 168 hours. No results for input(s): LIPASE, AMYLASE in the last 168 hours. No results for input(s): AMMONIA in the last 168 hours. CBC: Recent Labs  Lab 01/24/17 2057 01/25/17 0535 01/26/17 0617  WBC 8.3 8.6 7.1  NEUTROABS 6.2  --   --   HGB 11.4* 10.2* 10.0*  HCT 35.8* 32.6* 31.1*  MCV 92.7 93.4 92.0  PLT 319 250 250   Cardiac Enzymes: No results for input(s): CKTOTAL, CKMB, CKMBINDEX, TROPONINI in the last 168 hours. BNP: BNP (last 3 results) No results for input(s): BNP in the last 8760 hours.  ProBNP (last 3 results) Recent Labs    02/09/16 1334  PROBNP 119.0*    CBG: Recent Labs  Lab  01/25/17 2110 01/26/17 0736 01/26/17 1150 01/26/17 1710 01/26/17 2208  GLUCAP 139* 126* 100* 124* 145*       Signed:  Domenic Polite MD.  [facility] 01/27/2017, 12:28 PM

## 2017-02-01 ENCOUNTER — Ambulatory Visit: Payer: Medicare Other | Admitting: Podiatry

## 2017-02-01 ENCOUNTER — Telehealth: Payer: Self-pay | Admitting: Endocrinology

## 2017-02-01 NOTE — Telephone Encounter (Signed)
LM for pt to call back to schedule appt per Dr. Ronnie Derby request

## 2017-02-01 NOTE — Telephone Encounter (Signed)
Patient needs post hospital follow-up visit within a week or so.  Please call patient to change appointment, same day labs okay

## 2017-02-03 ENCOUNTER — Telehealth: Payer: Self-pay | Admitting: Endocrinology

## 2017-02-03 NOTE — Telephone Encounter (Signed)
Hospitalist ordered home care. Holly at [facility] called. She says the pt is doing well and does not appear to need any home health. Just to inform Dwyane Dee.

## 2017-02-03 NOTE — Telephone Encounter (Signed)
FYI

## 2017-02-08 ENCOUNTER — Other Ambulatory Visit: Payer: Medicare Other

## 2017-02-08 ENCOUNTER — Ambulatory Visit: Payer: Medicare Other

## 2017-02-08 DIAGNOSIS — N135 Crossing vessel and stricture of ureter without hydronephrosis: Secondary | ICD-10-CM | POA: Diagnosis not present

## 2017-02-09 ENCOUNTER — Other Ambulatory Visit: Payer: Self-pay | Admitting: Urology

## 2017-02-10 ENCOUNTER — Ambulatory Visit: Payer: Medicare Other | Admitting: Endocrinology

## 2017-02-21 ENCOUNTER — Telehealth: Payer: Self-pay | Admitting: Endocrinology

## 2017-02-21 ENCOUNTER — Other Ambulatory Visit: Payer: Self-pay | Admitting: Endocrinology

## 2017-02-21 MED ORDER — HYDROCODONE-ACETAMINOPHEN 5-325 MG PO TABS: 1.0000 | ORAL_TABLET | Freq: Two times a day (BID) | ORAL | 0 refills | Status: DC | PRN

## 2017-02-21 NOTE — Telephone Encounter (Signed)
Patient stated she need a refill of HYDROcodone-acetaminophen (NORCO/VICODIN) 5-325 MG tablet [[id number]]   She will like to pick it up tomorrow when she come to get her labs

## 2017-02-21 NOTE — Telephone Encounter (Signed)
Sent!

## 2017-02-21 NOTE — Telephone Encounter (Signed)
Did you want to do this prescription? Please see message.

## 2017-02-22 ENCOUNTER — Other Ambulatory Visit: Payer: Medicare Other

## 2017-02-22 ENCOUNTER — Encounter: Payer: Self-pay | Admitting: Endocrinology

## 2017-02-22 ENCOUNTER — Ambulatory Visit (INDEPENDENT_AMBULATORY_CARE_PROVIDER_SITE_OTHER): Payer: Medicare Other

## 2017-02-22 ENCOUNTER — Other Ambulatory Visit (INDEPENDENT_AMBULATORY_CARE_PROVIDER_SITE_OTHER): Payer: Medicare Other

## 2017-02-22 DIAGNOSIS — E875 Hyperkalemia: Secondary | ICD-10-CM | POA: Diagnosis not present

## 2017-02-22 DIAGNOSIS — D638 Anemia in other chronic diseases classified elsewhere: Secondary | ICD-10-CM

## 2017-02-22 DIAGNOSIS — Z23 Encounter for immunization: Secondary | ICD-10-CM

## 2017-02-22 LAB — CBC
HEMATOCRIT: 36.5 % (ref 36.0–46.0)
HEMOGLOBIN: 11.6 g/dL — AB (ref 12.0–15.0)
MCHC: 31.7 g/dL (ref 30.0–36.0)
MCV: 94.7 fl (ref 78.0–100.0)
Platelets: 240 10*3/uL (ref 150.0–400.0)
RBC: 3.85 Mil/uL — AB (ref 3.87–5.11)
RDW: 15.5 % (ref 11.5–15.5)
WBC: 9.6 10*3/uL (ref 4.0–10.5)

## 2017-02-22 LAB — BASIC METABOLIC PANEL
BUN: 41 mg/dL — ABNORMAL HIGH (ref 6–23)
CALCIUM: 8.8 mg/dL (ref 8.4–10.5)
CO2: 27 meq/L (ref 19–32)
CREATININE: 2.41 mg/dL — AB (ref 0.40–1.20)
Chloride: 100 mEq/L (ref 96–112)
GFR: 20.77 mL/min — ABNORMAL LOW (ref >60.00)
GLUCOSE: 77 mg/dL (ref 70–99)
Potassium: 4.7 mEq/L (ref 3.5–5.1)
Sodium: 137 mEq/L (ref 135–145)

## 2017-02-22 NOTE — Telephone Encounter (Signed)
Need medication HYDROcodone-acetaminophen (NORCO/VICODIN) 5-325 MG tablet [[id number]]   sent in for 60 pills  Pharmacy:  [facility] [place], [place] AT [place]

## 2017-02-23 NOTE — Telephone Encounter (Signed)
Called patient and she stated that she usually gets 60 pills from you for this prescription. She asked if you could send for 60 pills.  Please advise.

## 2017-02-23 NOTE — Telephone Encounter (Signed)
I have just prescribed 30 tablets and can send another prescription in 15 days or give her a handwritten prescription on her visit

## 2017-02-23 NOTE — Telephone Encounter (Signed)
Called patient and gave her the message from Dr. Dwyane Dee. She stated that she will be coming in this week and will get a hand written prescription. She stated that she has been taking 2 daily and she can hardly move without taking these.

## 2017-02-24 ENCOUNTER — Ambulatory Visit (INDEPENDENT_AMBULATORY_CARE_PROVIDER_SITE_OTHER): Payer: Medicare Other | Admitting: Endocrinology

## 2017-02-24 ENCOUNTER — Encounter: Payer: Self-pay | Admitting: Endocrinology

## 2017-02-24 VITALS — BP 120/70 | HR 56 | Ht 66.0 in | Wt 134.6 lb

## 2017-02-24 DIAGNOSIS — N289 Disorder of kidney and ureter, unspecified: Secondary | ICD-10-CM | POA: Diagnosis not present

## 2017-02-24 DIAGNOSIS — E119 Type 2 diabetes mellitus without complications: Secondary | ICD-10-CM

## 2017-02-24 DIAGNOSIS — I25810 Atherosclerosis of coronary artery bypass graft(s) without angina pectoris: Secondary | ICD-10-CM

## 2017-02-24 MED ORDER — HYDROCODONE-ACETAMINOPHEN 5-325 MG PO TABS: 1.0000 | ORAL_TABLET | Freq: Two times a day (BID) | ORAL | 0 refills | Status: DC | PRN

## 2017-02-24 NOTE — Patient Instructions (Signed)
Stop Lasix  More fluids

## 2017-02-24 NOTE — Progress Notes (Signed)
Patient ID: [name] [name], female   DOB: [birth year], 76 y.o.   MRN: [id number]   Reason for Appointment: Follow-up of various problems  History of Present Illness    RENAL dysfunction:  Renal function is variable,  appears to be again worse, previous creatinine generally stable about 1.5 Does not have diabetic nephropathy She was also admitted to the hospital last month and it better with hydration  Her hydralazine has been stopped by another physician and she is on metoprolol from cardiologist She takes Lasix 20 mg every other day  She does not complain of feeling lightheaded on standing up  Also on her last visit she was told to cut back on high potassium foods although her diet was usually fairly good Potassium is back to normal consistently    Lab Results  Component Value Date   CREATININE 2.41 (H) 02/22/2017   CREATININE 1.71 (H) 01/27/2017   CREATININE 1.89 (H) 01/26/2017   CREATININE 1.95 (H) 01/25/2017   Lab Results  Component Value Date   CREATININE 2.41 (H) 02/22/2017   BUN 41 (H) 02/22/2017   NA 137 02/22/2017   K 4.7 02/22/2017   CL 100 02/22/2017   CO2 27 02/22/2017     Type 2 DIABETES mellitus, date of diagnosis: 1986.   The insulin regimen is: Levemir 14 units hs, Novolog 6 ac twice a day   Type 2 diabetes  has been treated in the last few years with low dose basal bolus insulin regimen. She has not been taking  any oral hypoglycemic drugs including metformin partly because of GI side effects  A1c Is ranging from 5.9-6.3  Current management and problems:  She appears to have lost weight again, has had hospitalization about a month ago  However she thinks her appetite is fairly good recently  Blood sugars are fairly stable throughout the day with only a couple of readings over her 140 and no hypoglycemia  Taking insulin consistently as directed including NovoLog  She is not able to exercise because of back pain  Side effects  from medications: Diarrhea from metformin and nausea and vomiting from GLP-1 drugs   Monitors blood glucose: Less than 1 times a day  Glucometer: One Touch.   Blood sugar readings   AVERAGE midday about 110, afternoon and evening average ranging from 132 up to 145 Overall median 123 Blood sugar range 95-1 57   Meals: she is usually eating low fat meals especially recently meals usually at 11 AM,and supper at 5 pm.  Calorie intake: Usually controlled. Moderate Carbs Physical activity: exercise: Minimal  Dietician visit: Most recent:, 5/13.   Wt Readings from Last 3 Encounters:  02/24/17 134 lb 9.6 oz (61.1 kg)  01/25/17 143 lb 11.8 oz (65.2 kg)  12/30/16 141 lb 9.6 oz (64.2 kg)    Lab Results  Component Value Date   HGBA1C 6.3 12/27/2016   HGBA1C 6.0 09/23/2016   HGBA1C 5.9 (H) 09/09/2016   Lab Results  Component Value Date   MICROALBUR 19.4 (H) 12/27/2016   LDLCALC 60 05/21/2016   CREATININE 2.41 (H) 02/22/2017    ANEMIA:  Taking iron supplements Hemoglobin has been mildly low, recently slightly better  Lab Results  Component Value Date   WBC 9.6 02/22/2017   HGB 11.6 (L) 02/22/2017   HCT 36.5 02/22/2017   MCV 94.7 02/22/2017   PLT 240.0 02/22/2017      HYPOTHYROIDISM: She has had long-standing primary hypothyroidism She is taking Levothyroxine, 88 g  daily   She does feel fairly good overall and TSH is consistently normal   Lab Results  Component Value Date   TSH 1.14 12/27/2016   TSH 0.64 09/23/2016   TSH 0.39 07/26/2016   FREET4 1.20 12/27/2016   FREET4 1.44 09/23/2016   FREET4 1.31 07/26/2016    OTHER active problems: See review of systems    Allergies as of 02/24/2017   No Known Allergies     Medication List        Accurate as of 02/24/17  3:48 PM. Always use your most recent med list.          albuterol 108 (90 Base) MCG/ACT inhaler Commonly known as:  PROVENTIL HFA;VENTOLIN HFA Inhale 2 puffs every 6 (six) hours as needed  into the lungs for wheezing or shortness of breath.   aspirin 81 MG chewable tablet Chew 81 mg by mouth daily.   B-D ULTRAFINE III SHORT PEN 31G X 8 MM Misc Generic drug:  Insulin Pen Needle USE TO INJECT INSULIN FOUR TIMES DAILY   calcium-vitamin D 500-200 MG-UNIT tablet Commonly known as:  OSCAL WITH D Take 1 tablet by mouth every morning.   ferrous sulfate 325 (65 FE) MG tablet Take 650 mg by mouth every evening.   furosemide 20 MG tablet Commonly known as:  LASIX TAKE 1 TABLET BY MOUTH ONCE DAILY   glucose blood test strip Commonly known as:  ONE TOUCH ULTRA TEST Use to test blood sugar 1-2 times daily Dx code- E11.9   HYDROcodone-acetaminophen 5-325 MG tablet Commonly known as:  NORCO/VICODIN Take 1 tablet by mouth 2 (two) times daily as needed for moderate pain or severe pain.   insulin aspart 100 UNIT/ML FlexPen Commonly known as:  NOVOLOG FLEXPEN Inject 3 Units into the skin 3 (three) times daily with meals.   insulin detemir 100 UNIT/ML injection Commonly known as:  LEVEMIR Inject 0.1 mLs (10 Units total) into the skin at bedtime.   isosorbide mononitrate 30 MG 24 hr tablet Commonly known as:  IMDUR Take 1 tablet (30 mg total) daily by mouth.   latanoprost 0.005 % ophthalmic solution Commonly known as:  XALATAN Place 1 drop into both eyes at bedtime.   levothyroxine 88 MCG tablet Commonly known as:  SYNTHROID, LEVOTHROID Take 1 tablet (88 mcg total) by mouth daily.   lidocaine 5 % Commonly known as:  LIDODERM Place 1 patch onto the skin daily. Remove & Discard patch within 12 hours or as directed by MD   metoprolol succinate 25 MG 24 hr tablet Commonly known as:  TOPROL-XL TAKE 1 TABLET DAILY   omega-3 acid ethyl esters 1 g capsule Commonly known as:  LOVAZA Take 1 capsule (1 g total) by mouth 2 (two) times daily.   omeprazole 20 MG capsule Commonly known as:  PRILOSEC TAKE 1 CAPSULE DAILY   polyethylene glycol packet Commonly known as:   MIRALAX / GLYCOLAX Take 17 g by mouth daily as needed for moderate constipation.   rosuvastatin 20 MG tablet Commonly known as:  CRESTOR Take 1 tablet (20 mg total) by mouth daily. Take 1 tablet daily   vitamin B-12 1000 MCG tablet Commonly known as:  CYANOCOBALAMIN Take 1,000 mcg by mouth daily.   vitamin C 500 MG tablet Commonly known as:  ASCORBIC ACID Take 500 mg by mouth at bedtime.   Vitamin D (Ergocalciferol) 50000 units Caps capsule Commonly known as:  DRISDOL TAKE 1 CAPSULE BY MOUTH EVERY 14 DAYS  Allergies: No Known Allergies  Past Medical History:  Diagnosis Date  . AAA (abdominal aortic aneurysm) ([place])   . Bilateral hydronephrosis   . Carotid artery stenosis    carotid doppler 06/2012 - Right CCA/Bulb/ICA with chronic occlusion; L vertebral artery with abnormal blood flow; L Bulb/Prox ICA  s/p endarterectomy with mild fibrous plaque, 50% diameter reduction  . CHF (congestive heart failure) ([place])   . Choledocholithiasis 2017  . CKD (chronic kidney disease), stage III ([place])   . Coronary artery disease due to lipid rich plaque cardiologist-  dr berry   s/p CABG x6 1997-- cath 12-09-2009 occluded vein to obtuse marginal branch and ramus branch with patent vien to PDA and patent LIMA to LAD, ef 40%-- Myoview 11-24-2011, nonischemic  . Diverticulosis   . Dyspnea on exertion   . GERD (gastroesophageal reflux disease)   . GI bleed   . History of sepsis    10-18-2014 w/ acute pyelonephritis  . Hyperlipidemia   . Hypertension   . Hypothyroidism   . Ischemic cardiomyopathy    03-25-2010-- per lasts echo EF  50-55%  . PAD (peripheral artery disease) ([place])    09/2010 LEAs - R ABI of 0.45, occluded fem-pop bypass graft, L ABI of 0.59 with occluded SFA; severe arterial insuff  . PVD (peripheral vascular disease) with claudication ([place])    last duplex 07-04-2015 -- Right CCA and ICA chronic occlusion, 92-11% LICA, Patent vertebral arteries w/ antegrade flow, bilateral  normal subclavian arteries   . Retroperitoneal fibrosis   . Tubular adenoma of colon   . Type 2 diabetes mellitus ([place])    monitored by dr Dwyane Dee    Past Surgical History:  Procedure Laterality Date  . AORTA - BILATERAL FEMORAL ARTERY BYPASS GRAFT  1997   and RIGHT FEM-POP   . CARDIAC CATHETERIZATION  12-09-2009  dr al little   EF >40%-- occluded vein to OM & ramus branches, patent vein to PDA, patent LIMA to LAD (Dr. [name], [facility]) - later had thrombectomy of R fem-pop bypass ad R common femoral & profunda femoris artery (Dr. Oneida Alar)  . CARDIOVASCULAR STRESS TEST  11-24-2011   dr berry   Low Risk study: fixed basal to mid inferior attenuation artifact, no reversible ischemia,  normal LV function and wall motion , ef 67%  . CAROTID ENDARTERECTOMY Bilateral right 1994//  left ?  Marland Kitchen CATARACT EXTRACTION W/ INTRAOCULAR LENS  IMPLANT, BILATERAL  2006  . CHOLECYSTECTOMY N/A 09/05/2015   Procedure: ATTEMPTED LAPAROSCOPIC CHOLECYSTECTOMY, EXPLORATORY LAPAROTOMY WITH CHOLECYSTECTOMY;  Surgeon: Autumn Messing III, MD;  Location: [place];  Service: General;  Laterality: N/A;  . COLONOSCOPY    . COLONOSCOPY  10/21/2015  . COLONOSCOPY WITH PROPOFOL N/A 10/21/2015   Procedure: COLONOSCOPY WITH PROPOFOL;  Surgeon: Milus Banister, MD;  Location: [place];  Service: Endoscopy;  Laterality: N/A;  . CORONARY ARTERY BYPASS GRAFT  1997   x6; internal mammary to LAD, SVG to ramus #1 & #2, SVG to OM, SVG to PDA,   . CYSTOSCOPY W/ RETROGRADES Right 08/01/2015   Procedure: CYSTOSCOPY WITH RETROGRADE PYELOGRAM;  Surgeon: Ardis Hughs, MD;  [facility];  Service: Urology;  Laterality: Right;  . CYSTOSCOPY W/ URETERAL STENT PLACEMENT  03/10/2012   Procedure: CYSTOSCOPY WITH RETROGRADE PYELOGRAM/URETERAL STENT PLACEMENT;  Surgeon: Hanley Ben, MD;  Location: [facility];  Service: Urology;  Laterality: Left;  . CYSTOSCOPY W/ URETERAL STENT PLACEMENT Bilateral 03/07/2015   Procedure: BILATERAL  RETROGRADE PYELOGRAM AND RIGHT URETERAL STENT  PLACEMENT;  Surgeon: Ardis Hughs, MD;  [facility];  Service: Urology;  Laterality: Bilateral;  . CYSTOSCOPY W/ URETERAL STENT PLACEMENT Right 08/01/2015   Procedure: CYSTOSCOPY WITH STENT REPLACEMENT;  Surgeon: Ardis Hughs, MD;  Location: [facility];  Service: Urology;  Laterality: Right;  . CYSTOSCOPY W/ URETERAL STENT PLACEMENT Right 02/13/2016   Procedure: RIGHT URETERAL STENT EXCHANGE;  Surgeon: Ardis Hughs, MD;  Location: [facility];  Service: Urology;  Laterality: Right;  . CYSTOSCOPY W/ URETERAL STENT PLACEMENT Right 09/17/2016   Procedure: CYSTOSCOPY WITH RIGHT  RETROGRADE PYELOGRAM RIGHT URETERAL STENT EXCHANGE;  Surgeon: Ardis Hughs, MD;  Location: [facility];  Service: Urology;  Laterality: Right;  . ERCP N/A 09/03/2015   Procedure: ENDOSCOPIC RETROGRADE CHOLANGIOPANCREATOGRAPHY (ERCP);  Surgeon: Irene Shipper, MD;  Location: [facility]COPY;  Service: Endoscopy;  Laterality: N/A;  . ESOPHAGOGASTRODUODENOSCOPY    . ESOPHAGOGASTRODUODENOSCOPY N/A 10/19/2015   Procedure: ESOPHAGOGASTRODUODENOSCOPY (EGD);  Surgeon: Gatha Mayer, MD;  Location: [facility] ENDOSCOPY;  Service: Endoscopy;  Laterality: N/A;  . GIVENS CAPSULE STUDY  10/21/2015  . GIVENS CAPSULE STUDY N/A 10/21/2015   Procedure: GIVENS CAPSULE STUDY;  Surgeon: Milus Banister, MD;  Location: [place];  Service: Endoscopy;  Laterality: N/A;  . REPAIR RIGHT FEMORAL PSEUDOANEUYSM/  RIGHT FEM-POP BYPASS GRAFT/  DEBRIDEMENT RIGHT LOWER EXTREMITIY VENOUS STATUS ULCERS X2  01-12-2005  . TOTAL ABDOMINAL HYSTERECTOMY W/ BILATERAL SALPINGOOPHORECTOMY  1986  . TRANSTHORACIC ECHOCARDIOGRAM  03/25/2010   EF 40-97%, LV systolic function low normal with mild inferoseptal hypocontractility; LA mildly dilated; mod MR; mild TR, RV systolic pressure elevated, mild pulm HTN; AV mildly sclerotic; mild pulm valve regurg; aortic root sclerosis/calcif      Family History  Problem Relation Age of Onset  . Congestive Heart Failure Mother   . Diabetes Mother   . Stroke Father   . Cancer Maternal Aunt        Breast cancer    Social History:  reports that she quit smoking about 7 years ago. She has a 60.00 pack-year smoking history. she has never used smokeless tobacco. She reports that she drinks about 1.8 oz of alcohol per week. She reports that she does not use drugs.  Review of Systems -     Hyperlipidemia: Has history of high LDL and triglycerides/low HDL  Was changed from Lipitor to Crestor because of higher LDL, tolerating this well    LDL is Controlled Tends to have high triglycerides, not clear why her level is over 300, previously 167 She does not think she is eating larger amounts of carbohydrates or higher fat meals and no unusual food intake the night before Her cardiologist wants her to take Lovaza 1 g twice a day which she has not started yet   Lab Results  Component Value Date   CHOL 131 12/27/2016   HDL 26.80 (L) 12/27/2016   LDLCALC 60 05/21/2016   LDLDIRECT 60.0 12/27/2016   TRIG 280.0 (H) 12/27/2016   CHOLHDL 5 12/27/2016     Vitamin D deficiency: She has been on supplements and taking her 50,000 units every other week  Her level is normal this year  Her orthopedic doctor is treating her for the low back pain which is chronic    History of UTI:  Needs a stent replaced next month, follows with urologist regularly   CHF: She was admitted with shortness of breath and orthopnea on 01/23/16 This has been followed by cardiologist She has  a low ejection fraction of 20-25 %  Currently on metoprolol and 20 mg Lasix every second day Also she had atrial flutter which has resolved   She feels fairly good, no dyspnea now  Examination:   BP 120/70   Pulse (!) 56   Ht 5\' 6"  (1.676 m)   Wt 134 lb 9.6 oz (61.1 kg)   SpO2 94%   BMI 21.73 kg/m   Body mass index is 21.73 kg/m.     Standing blood pressure  85/45  Lungs clear No ankle edema present   Assesment/PLAN:    RENAL insufficiency:   This is chronic and now appears to be relatively worse With her weight loss and orthostatic hypotension she is likely getting overdiuresis Currently on only metoprolol for blood pressure/cardiac benefits  Recommended we stop her Lasix and take it only as needed when she has any dyspnea Needs to come back in 10 days for repeat renal functions She is also going to get her renal stent replaced next month and not clear if she may have some obstruction  HYPOKALEMIA: Potassium is back to normal  ANEMIA: Likely to be from chronic disease and fairly stable Continue taking iron  DIABETES: Her A1c is 6.3 in October with fairly good blood sugars at home now She will continue her insulin regimen unchanged  Chronic and recurrent low back pain: She is taking hydrocodone when she is more active and she will get a prescription of 60 tablets to take with her today   There are no Patient Instructions on file for this visit.    [name]  02/24/17    Note: This office note was prepared with Dragon voice recognition system technology. Any transcriptional errors that result from this process are unintentional.

## 2017-03-02 ENCOUNTER — Ambulatory Visit: Payer: Medicare Other | Admitting: Podiatry

## 2017-03-08 ENCOUNTER — Other Ambulatory Visit: Payer: Self-pay | Admitting: Endocrinology

## 2017-03-09 ENCOUNTER — Encounter: Payer: Self-pay | Admitting: Podiatry

## 2017-03-09 ENCOUNTER — Ambulatory Visit (INDEPENDENT_AMBULATORY_CARE_PROVIDER_SITE_OTHER): Payer: Medicare Other | Admitting: Podiatry

## 2017-03-09 DIAGNOSIS — M79675 Pain in left toe(s): Secondary | ICD-10-CM

## 2017-03-09 DIAGNOSIS — M79674 Pain in right toe(s): Secondary | ICD-10-CM

## 2017-03-09 DIAGNOSIS — E1151 Type 2 diabetes mellitus with diabetic peripheral angiopathy without gangrene: Secondary | ICD-10-CM

## 2017-03-09 DIAGNOSIS — B351 Tinea unguium: Secondary | ICD-10-CM

## 2017-03-09 NOTE — Progress Notes (Signed)
Complaint:  Visit Type: Patient returns to my office for continued preventative foot care services. Complaint: Patient states" my nails have grown long and thick and become painful to walk and wear shoes" Patient has been diagnosed with DM The patient presents for preventative foot care services. No changes to ROS.  She says the callus under her right big toe joint is  pain-free.  Podiatric Exam: Vascular: dorsalis pedis and posterior tibial pulses are diminished   bilateral. Capillary return is immediate. Temperature gradient is WNL. Skin turgor WNL  Sensorium: Diminished Semmes Weinstein monofilament test. Normal tactile sensation bilaterally. Nail Exam: Pt has thick disfigured discolored nails with subungual debris noted bilateral entire nail hallux through fifth toenails Ulcer Exam: There is no evidence of ulcer or pre-ulcerative changes or infection. Orthopedic Exam: Muscle tone and strength are WNL. No limitations in general ROM. No crepitus or effusions noted. Foot type and digits show no abnormalities. Bony prominences are unremarkable. Skin: No Porokeratosis. No infection or ulcers  Diagnosis:  Onychomycosis, , Pain in right toe, pain in left toes  Treatment & Plan Procedures and Treatment: Consent by patient was obtained for treatment procedures. The patient understood the discussion of treatment and procedures well. All questions were answered thoroughly reviewed. Debridement of mycotic and hypertrophic toenails, 1 through 5 bilateral and clearing of subungual debris. No ulceration, no infection noted. Asymptomatic callus  B/l Return Visit-Office Procedure: Patient instructed to return to the office for a follow up visit 3 months for continued evaluation and treatment.    Gardiner Barefoot DPM

## 2017-03-10 ENCOUNTER — Other Ambulatory Visit (INDEPENDENT_AMBULATORY_CARE_PROVIDER_SITE_OTHER): Payer: Medicare Other

## 2017-03-10 DIAGNOSIS — N289 Disorder of kidney and ureter, unspecified: Secondary | ICD-10-CM | POA: Diagnosis not present

## 2017-03-10 LAB — BASIC METABOLIC PANEL
BUN: 29 mg/dL — ABNORMAL HIGH (ref 6–23)
CHLORIDE: 106 meq/L (ref 96–112)
CO2: 26 meq/L (ref 19–32)
Calcium: 9.2 mg/dL (ref 8.4–10.5)
Creatinine, Ser: 1.63 mg/dL — ABNORMAL HIGH (ref 0.40–1.20)
GFR: 32.61 mL/min — ABNORMAL LOW (ref >60.00)
Glucose, Bld: 93 mg/dL (ref 70–99)
POTASSIUM: 5.3 meq/L — AB (ref 3.5–5.1)
Sodium: 138 mEq/L (ref 135–145)

## 2017-03-11 NOTE — Telephone Encounter (Signed)
I contacted the patient and she requested the labs resulted from today be sent to [facility] because she has an upcoming appointment with them next week. Labs submitted.

## 2017-03-11 NOTE — Patient Instructions (Addendum)
[name] [name]  03/11/2017   Your procedure is scheduled on: 03-18-17  Report to [facility] Main  Entrance  Report to admitting at 830 AM  Call this number if you have problems the morning of surgery [phone]   Remember: Do not eat food or drink liquids :After Midnight.     Take these medicines the morning of surgery with A SIP OF WATER:HYDROCODONE IF NEEDED, ISOSORBIDE MONONITRATE (IMDUR), ALBUTEROL INHALER PRN AND BRING INHALER, ROSUVASTATIN (CRESTOR), METOPROLOL SUCCINATE (TOPROLOL XL), LEVOTHYROXINE (SYNTHROID), OMEPRAZOLE (PROTONIX)  DO NOT TAKE ANY DIABETIC MEDICATIONS DAY OF YOUR SURGERY                               You may not have any metal on your body including hair pins and              piercings  Do not wear jewelry, make-up, lotions, powders or perfumes, deodorant             Do not wear nail polish.  Do not shave  48 hours prior to surgery.               Do not bring valuables to the hospital. [place].  Contacts, dentures or bridgework may not be worn into surgery.  Leave suitcase in the car. After surgery it may be brought to your room.     Patients discharged the day of surgery will not be allowed to drive home.  Name and phone number of your driver:daughter [name] [name] [phone] cell  Special Instructions: N/A              Please read over the following fact sheets you were given: _____________________________________________________________________             How to Manage Your Diabetes Before and After Surgery  Why is it important to control my blood sugar before and after surgery? . Improving blood sugar levels before and after surgery helps healing and can limit problems. . A way of improving blood sugar control is eating a healthy diet by: o  Eating less sugar and carbohydrates o  Increasing activity/exercise o  Talking with your doctor about reaching your blood sugar  goals . High blood sugars (greater than 180 mg/dL) can raise your risk of infections and slow your recovery, so you will need to focus on controlling your diabetes during the weeks before surgery. . Make sure that the doctor who takes care of your diabetes knows about your planned surgery including the date and location.  How do I manage my blood sugar before surgery? . Check your blood sugar at least 4 times a day, starting 2 days before surgery, to make sure that the level is not too high or low. o Check your blood sugar the morning of your surgery when you wake up and every 2 hours until you get to the Short Stay unit. . If your blood sugar is less than 70 mg/dL, you will need to treat for low blood sugar: o Do not take insulin. o Treat a low blood sugar (less than 70 mg/dL) with  cup of clear juice (cranberry or apple), 4 glucose tablets, OR glucose gel. o Recheck blood sugar in 15 minutes after  treatment (to make sure it is greater than 70 mg/dL). If your blood sugar is not greater than 70 mg/dL on recheck, call [phone] for further instructions. . Report your blood sugar to the short stay nurse when you get to Short Stay.  . If you are admitted to the hospital after surgery: o Your blood sugar will be checked by the staff and you will probably be given insulin after surgery (instead of oral diabetes medicines) to make sure you have good blood sugar levels. o The goal for blood sugar control after surgery is 80-180 mg/dL.   WHAT DO I DO ABOUT MY DIABETES MEDICATION? THE DAY BEFORE SURGERY TAKE YOUR NOVOLOG AS USUAL  . THE NIGHT BEFORE SURGERY, take 5 UNITS OF LEVEMIR  insulin.       THE MORNING OF SURGERY, TAKE NO INSULIN     [facility] - Preparing for Surgery Before surgery, you can play an important role.  Because skin is not sterile, your skin needs to be as free of germs as possible.  You can reduce the number of germs on your skin by washing with CHG (chlorahexidine  gluconate) soap before surgery.  CHG is an antiseptic cleaner which kills germs and bonds with the skin to continue killing germs even after washing. Please DO NOT use if you have an allergy to CHG or antibacterial soaps.  If your skin becomes reddened/irritated stop using the CHG and inform your nurse when you arrive at Short Stay. Do not shave (including legs and underarms) for at least 48 hours prior to the first CHG shower.  You may shave your face/neck. Please follow these instructions carefully:  1.  Shower with CHG Soap the night before surgery and the  morning of Surgery.  2.  If you choose to wash your hair, wash your hair first as usual with your  normal  shampoo.  3.  After you shampoo, rinse your hair and body thoroughly to remove the  shampoo.                           4.  Use CHG as you would any other liquid soap.  You can apply chg directly  to the skin and wash                       Gently with a scrungie or clean washcloth.  5.  Apply the CHG Soap to your body ONLY FROM THE NECK DOWN.   Do not use on face/ open                           Wound or open sores. Avoid contact with eyes, ears mouth and genitals (private parts).                       Wash face,  Genitals (private parts) with your normal soap.             6.  Wash thoroughly, paying special attention to the area where your surgery  will be performed.  7.  Thoroughly rinse your body with warm water from the neck down.  8.  DO NOT shower/wash with your normal soap after using and rinsing off  the CHG Soap.                9.  Pat yourself dry with a clean towel.  10.  Wear clean pajamas.            11.  Place clean sheets on your bed the night of your first shower and do not  sleep with pets. Day of Surgery : Do not apply any lotions/deodorants the morning of surgery.  Please wear clean clothes to the hospital/surgery center.  FAILURE TO FOLLOW THESE INSTRUCTIONS MAY RESULT IN THE CANCELLATION OF YOUR  SURGERY PATIENT SIGNATURE_________________________________  NURSE SIGNATURE__________________________________  ________________________________________________________________________

## 2017-03-11 NOTE — Telephone Encounter (Signed)
Patient asked to call her today she has some information for you.

## 2017-03-11 NOTE — Progress Notes (Addendum)
[facility] 09-28-16 MENG HO PA 09-28-16 Epic EKG 09-09-16 Epic CHEST CT 623-17 Epic ECHO 04-15-16 Epic

## 2017-03-11 NOTE — Progress Notes (Signed)
Please call to let patient know that the kidney test is better but potassium is high.  She needs to cut back on bananas, oranges, large amounts of potatoes and dairy products

## 2017-03-15 ENCOUNTER — Encounter (HOSPITAL_COMMUNITY)
Admission: RE | Admit: 2017-03-15 | Discharge: 2017-03-15 | Disposition: A | Discharge status: Home / Self Care | Payer: Medicare Other | Source: Ambulatory Visit | Attending: Urology | Admitting: Urology

## 2017-03-15 ENCOUNTER — Other Ambulatory Visit: Payer: Self-pay

## 2017-03-15 ENCOUNTER — Encounter (HOSPITAL_COMMUNITY): Payer: Self-pay

## 2017-03-15 DIAGNOSIS — Z01810 Encounter for preprocedural cardiovascular examination: Secondary | ICD-10-CM | POA: Diagnosis not present

## 2017-03-15 DIAGNOSIS — N189 Chronic kidney disease, unspecified: Secondary | ICD-10-CM | POA: Diagnosis not present

## 2017-03-15 DIAGNOSIS — Z951 Presence of aortocoronary bypass graft: Secondary | ICD-10-CM | POA: Diagnosis not present

## 2017-03-15 DIAGNOSIS — E1122 Type 2 diabetes mellitus with diabetic chronic kidney disease: Secondary | ICD-10-CM | POA: Diagnosis not present

## 2017-03-15 DIAGNOSIS — I2510 Atherosclerotic heart disease of native coronary artery without angina pectoris: Secondary | ICD-10-CM | POA: Diagnosis not present

## 2017-03-15 DIAGNOSIS — I13 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 1 through stage 4 chronic kidney disease, or unspecified chronic kidney disease: Secondary | ICD-10-CM | POA: Diagnosis not present

## 2017-03-15 DIAGNOSIS — Z01812 Encounter for preprocedural laboratory examination: Secondary | ICD-10-CM | POA: Diagnosis not present

## 2017-03-15 DIAGNOSIS — Z79890 Hormone replacement therapy (postmenopausal): Secondary | ICD-10-CM | POA: Diagnosis not present

## 2017-03-15 DIAGNOSIS — E7800 Pure hypercholesterolemia, unspecified: Secondary | ICD-10-CM | POA: Diagnosis not present

## 2017-03-15 DIAGNOSIS — Z794 Long term (current) use of insulin: Secondary | ICD-10-CM | POA: Diagnosis not present

## 2017-03-15 DIAGNOSIS — N136 Pyonephrosis: Secondary | ICD-10-CM | POA: Diagnosis not present

## 2017-03-15 DIAGNOSIS — E039 Hypothyroidism, unspecified: Secondary | ICD-10-CM | POA: Diagnosis not present

## 2017-03-15 DIAGNOSIS — Z79899 Other long term (current) drug therapy: Secondary | ICD-10-CM | POA: Diagnosis not present

## 2017-03-15 LAB — BASIC METABOLIC PANEL
ANION GAP: 6 (ref 5–15)
BUN: 30 mg/dL — ABNORMAL HIGH (ref 6–20)
CALCIUM: 8.7 mg/dL — AB (ref 8.9–10.3)
CO2: 25 mmol/L (ref 22–32)
CREATININE: 1.75 mg/dL — AB (ref 0.44–1.00)
Chloride: 108 mmol/L (ref 101–111)
GFR, EST AFRICAN AMERICAN: 32 mL/min — AB (ref >60)
GFR, EST NON AFRICAN AMERICAN: 27 mL/min — AB (ref >60)
Glucose, Bld: 129 mg/dL — ABNORMAL HIGH (ref 65–99)
Potassium: 5.5 mmol/L — ABNORMAL HIGH (ref 3.5–5.1)
SODIUM: 139 mmol/L (ref 135–145)

## 2017-03-15 LAB — CBC
HCT: 35.6 % — ABNORMAL LOW (ref 36.0–46.0)
Hemoglobin: 10.9 g/dL — ABNORMAL LOW (ref 12.0–15.0)
MCH: 29.5 pg (ref 26.0–34.0)
MCHC: 30.6 g/dL (ref 30.0–36.0)
MCV: 96.5 fL (ref 78.0–100.0)
PLATELETS: 244 10*3/uL (ref 150–400)
RBC: 3.69 MIL/uL — ABNORMAL LOW (ref 3.87–5.11)
RDW: 14.3 % (ref 11.5–15.5)
WBC: 7.6 10*3/uL (ref 4.0–10.5)

## 2017-03-15 LAB — HEMOGLOBIN A1C
Hgb A1c MFr Bld: 5.4 % (ref 4.8–5.6)
MEAN PLASMA GLUCOSE: 108.28 mg/dL

## 2017-03-15 LAB — GLUCOSE, CAPILLARY: GLUCOSE-CAPILLARY: 132 mg/dL — AB (ref 65–99)

## 2017-03-15 NOTE — Progress Notes (Signed)
bmet results routed to dr berrick by epic fax

## 2017-03-16 MED ORDER — ONDANSETRON HCL 4 MG/2ML IJ SOLN
INTRAMUSCULAR | Status: AC
  Filled 2017-03-16: qty 2

## 2017-03-16 MED ORDER — SUCCINYLCHOLINE CHLORIDE 200 MG/10ML IV SOSY
PREFILLED_SYRINGE | INTRAVENOUS | Status: AC
  Filled 2017-03-16: qty 10

## 2017-03-16 MED ORDER — PROPOFOL 10 MG/ML IV BOLUS
INTRAVENOUS | Status: AC
  Filled 2017-03-16: qty 40

## 2017-03-16 MED ORDER — DEXAMETHASONE SODIUM PHOSPHATE 10 MG/ML IJ SOLN
INTRAMUSCULAR | Status: AC
  Filled 2017-03-16: qty 1

## 2017-03-16 MED ORDER — LIDOCAINE 2% (20 MG/ML) 5 ML SYRINGE
INTRAMUSCULAR | Status: AC
  Filled 2017-03-16: qty 5

## 2017-03-16 MED ORDER — SUGAMMADEX SODIUM 200 MG/2ML IV SOLN
INTRAVENOUS | Status: AC
  Filled 2017-03-16: qty 2

## 2017-03-16 MED ORDER — ROCURONIUM BROMIDE 50 MG/5ML IV SOSY
PREFILLED_SYRINGE | INTRAVENOUS | Status: AC
  Filled 2017-03-16: qty 10

## 2017-03-16 NOTE — Progress Notes (Signed)
Final EKG done 03/15/17-epic

## 2017-03-17 MED ORDER — GENTAMICIN SULFATE 40 MG/ML IJ SOLN
300.0000 mg | INTRAMUSCULAR | Status: DC
Start: 2017-03-18 — End: 2017-03-18
  Filled 2017-03-17: qty 7.5

## 2017-03-17 NOTE — Anesthesia Preprocedure Evaluation (Addendum)
Anesthesia Evaluation  Patient identified by MRN, date of birth, ID band Patient awake    Reviewed: Allergy & Precautions, NPO status , Patient's Chart, lab work & pertinent test results  Airway Mallampati: II  TM Distance: >3 FB Neck ROM: Full    Dental no notable dental hx. (+) Poor Dentition   Pulmonary neg pulmonary ROS, former smoker,    Pulmonary exam normal breath sounds clear to auscultation       Cardiovascular hypertension, + CAD, + Peripheral Vascular Disease, +CHF and + DOE  negative cardio ROS Normal cardiovascular exam Rhythm:Regular Rate:Normal     Neuro/Psych negative neurological ROS  negative psych ROS   GI/Hepatic negative GI ROS, Neg liver ROS, GERD  ,  Endo/Other  negative endocrine ROSdiabetesHypothyroidism   Renal/GU Renal diseasenegative Renal ROS  negative genitourinary   Musculoskeletal negative musculoskeletal ROS (+)   Abdominal   Peds  Hematology negative hematology ROS (+) anemia ,   Anesthesia Other Findings   Reproductive/Obstetrics negative OB ROS                            Anesthesia Physical Anesthesia Plan  ASA: III  Anesthesia Plan: General   Post-op Pain Management:    Induction: Intravenous  PONV Risk Score and Plan: Treatment may vary due to age or medical condition, Dexamethasone and Ondansetron  Airway Management Planned: LMA  Additional Equipment:   Intra-op Plan:   Post-operative Plan: Extubation in OR  Informed Consent: I have reviewed the patients History and Physical, chart, labs and discussed the procedure including the risks, benefits and alternatives for the proposed anesthesia with the patient or authorized representative who has indicated his/her understanding and acceptance.   Dental advisory given  Plan Discussed with: CRNA  Anesthesia Plan Comments:         Anesthesia Quick Evaluation

## 2017-03-18 ENCOUNTER — Ambulatory Visit (HOSPITAL_COMMUNITY): Payer: Medicare Other | Admitting: Anesthesiology

## 2017-03-18 ENCOUNTER — Encounter (HOSPITAL_COMMUNITY): Payer: Self-pay | Admitting: *Deleted

## 2017-03-18 ENCOUNTER — Ambulatory Visit (HOSPITAL_COMMUNITY)
Admission: RE | Admit: 2017-03-18 | Discharge: 2017-03-18 | Disposition: A | Discharge status: Home / Self Care | Payer: Medicare Other | Source: Ambulatory Visit | Attending: Urology | Admitting: Urology

## 2017-03-18 ENCOUNTER — Ambulatory Visit (HOSPITAL_COMMUNITY): Payer: Medicare Other

## 2017-03-18 ENCOUNTER — Encounter (HOSPITAL_COMMUNITY)
Admission: RE | Disposition: A | Discharge status: Home / Self Care | Payer: Self-pay | Source: Ambulatory Visit | Attending: Urology

## 2017-03-18 DIAGNOSIS — N136 Pyonephrosis: Secondary | ICD-10-CM | POA: Diagnosis not present

## 2017-03-18 DIAGNOSIS — I2510 Atherosclerotic heart disease of native coronary artery without angina pectoris: Secondary | ICD-10-CM | POA: Diagnosis not present

## 2017-03-18 DIAGNOSIS — N135 Crossing vessel and stricture of ureter without hydronephrosis: Secondary | ICD-10-CM | POA: Diagnosis not present

## 2017-03-18 DIAGNOSIS — Z79899 Other long term (current) drug therapy: Secondary | ICD-10-CM | POA: Insufficient documentation

## 2017-03-18 DIAGNOSIS — Z79890 Hormone replacement therapy (postmenopausal): Secondary | ICD-10-CM | POA: Insufficient documentation

## 2017-03-18 DIAGNOSIS — I13 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 1 through stage 4 chronic kidney disease, or unspecified chronic kidney disease: Secondary | ICD-10-CM | POA: Insufficient documentation

## 2017-03-18 DIAGNOSIS — N189 Chronic kidney disease, unspecified: Secondary | ICD-10-CM | POA: Insufficient documentation

## 2017-03-18 DIAGNOSIS — Z01810 Encounter for preprocedural cardiovascular examination: Secondary | ICD-10-CM | POA: Insufficient documentation

## 2017-03-18 DIAGNOSIS — Z794 Long term (current) use of insulin: Secondary | ICD-10-CM | POA: Insufficient documentation

## 2017-03-18 DIAGNOSIS — Z951 Presence of aortocoronary bypass graft: Secondary | ICD-10-CM | POA: Diagnosis not present

## 2017-03-18 DIAGNOSIS — E1122 Type 2 diabetes mellitus with diabetic chronic kidney disease: Secondary | ICD-10-CM | POA: Diagnosis not present

## 2017-03-18 DIAGNOSIS — E7800 Pure hypercholesterolemia, unspecified: Secondary | ICD-10-CM | POA: Insufficient documentation

## 2017-03-18 DIAGNOSIS — E039 Hypothyroidism, unspecified: Secondary | ICD-10-CM | POA: Diagnosis not present

## 2017-03-18 DIAGNOSIS — N261 Atrophy of kidney (terminal): Secondary | ICD-10-CM | POA: Diagnosis not present

## 2017-03-18 DIAGNOSIS — Z01812 Encounter for preprocedural laboratory examination: Secondary | ICD-10-CM | POA: Insufficient documentation

## 2017-03-18 DIAGNOSIS — I714 Abdominal aortic aneurysm, without rupture: Secondary | ICD-10-CM | POA: Diagnosis not present

## 2017-03-18 DIAGNOSIS — N390 Urinary tract infection, site not specified: Secondary | ICD-10-CM | POA: Diagnosis not present

## 2017-03-18 HISTORY — PX: CYSTOSCOPY W/ URETERAL STENT PLACEMENT: SHX1429

## 2017-03-18 LAB — GLUCOSE, CAPILLARY
GLUCOSE-CAPILLARY: 109 mg/dL — AB (ref 65–99)
Glucose-Capillary: 103 mg/dL — ABNORMAL HIGH (ref 65–99)

## 2017-03-18 SURGERY — CYSTOSCOPY, WITH RETROGRADE PYELOGRAM AND URETERAL STENT INSERTION
Anesthesia: General | Laterality: Bilateral

## 2017-03-18 MED ORDER — BELLADONNA-OPIUM 16.2-30 MG RE SUPP
RECTAL | Status: AC
  Filled 2017-03-18: qty 1

## 2017-03-18 MED ORDER — PROPOFOL 10 MG/ML IV BOLUS
INTRAVENOUS | Status: DC | PRN
  Administered 2017-03-18: 130 mg via INTRAVENOUS

## 2017-03-18 MED ORDER — ONDANSETRON HCL 4 MG/2ML IJ SOLN
INTRAMUSCULAR | Status: AC
  Filled 2017-03-18: qty 2

## 2017-03-18 MED ORDER — FENTANYL CITRATE (PF) 100 MCG/2ML IJ SOLN
INTRAMUSCULAR | Status: DC | PRN
  Administered 2017-03-18: 25 ug via INTRAVENOUS

## 2017-03-18 MED ORDER — DEXAMETHASONE SODIUM PHOSPHATE 10 MG/ML IJ SOLN
INTRAMUSCULAR | Status: AC
  Filled 2017-03-18: qty 1

## 2017-03-18 MED ORDER — LIDOCAINE 2% (20 MG/ML) 5 ML SYRINGE
INTRAMUSCULAR | Status: AC
  Filled 2017-03-18: qty 5

## 2017-03-18 MED ORDER — LACTATED RINGERS IV SOLN
INTRAVENOUS | Status: DC
  Administered 2017-03-18: 09:00:00 via INTRAVENOUS

## 2017-03-18 MED ORDER — FENTANYL CITRATE (PF) 100 MCG/2ML IJ SOLN
INTRAMUSCULAR | Status: AC
Start: 2017-03-18 — End: 2017-03-18
  Filled 2017-03-18: qty 2

## 2017-03-18 MED ORDER — EPHEDRINE 5 MG/ML INJ
INTRAVENOUS | Status: AC
  Filled 2017-03-18: qty 10

## 2017-03-18 MED ORDER — PROMETHAZINE HCL 25 MG/ML IJ SOLN: 6.2500 mg | INTRAMUSCULAR | Status: DC | PRN

## 2017-03-18 MED ORDER — SODIUM CHLORIDE 0.9 % IR SOLN
Status: DC | PRN
  Administered 2017-03-18: 3000 mL

## 2017-03-18 MED ORDER — ACETAMINOPHEN 500 MG PO TABS
ORAL_TABLET | ORAL | Status: AC
  Filled 2017-03-18: qty 2

## 2017-03-18 MED ORDER — ACETAMINOPHEN 500 MG PO TABS
1000.0000 mg | ORAL_TABLET | Freq: Once | ORAL | Status: AC
  Administered 2017-03-18: 1000 mg via ORAL

## 2017-03-18 MED ORDER — HYDROCODONE-ACETAMINOPHEN 7.5-325 MG PO TABS: 1.0000 | ORAL_TABLET | Freq: Once | ORAL | Status: DC | PRN

## 2017-03-18 MED ORDER — DEXAMETHASONE SODIUM PHOSPHATE 10 MG/ML IJ SOLN
INTRAMUSCULAR | Status: DC | PRN
  Administered 2017-03-18: 10 mg via INTRAVENOUS

## 2017-03-18 MED ORDER — EPHEDRINE SULFATE-NACL 50-0.9 MG/10ML-% IV SOSY
PREFILLED_SYRINGE | INTRAVENOUS | Status: DC | PRN
  Administered 2017-03-18: 10 mg via INTRAVENOUS

## 2017-03-18 MED ORDER — GENTAMICIN SULFATE 40 MG/ML IJ SOLN
1.5000 mg/kg | INTRAVENOUS | Status: AC
  Administered 2017-03-18: 80 mg via INTRAVENOUS
  Filled 2017-03-18: qty 2.25

## 2017-03-18 MED ORDER — SODIUM CHLORIDE 0.9 % IV SOLN
INTRAVENOUS | Status: DC | PRN
  Administered 2017-03-18: 35 mL

## 2017-03-18 MED ORDER — LIDOCAINE 2% (20 MG/ML) 5 ML SYRINGE
INTRAMUSCULAR | Status: DC | PRN
  Administered 2017-03-18: 100 mg via INTRAVENOUS

## 2017-03-18 MED ORDER — PROPOFOL 10 MG/ML IV BOLUS
INTRAVENOUS | Status: AC
  Filled 2017-03-18: qty 20

## 2017-03-18 MED ORDER — BELLADONNA ALKALOIDS-OPIUM 16.2-60 MG RE SUPP
RECTAL | Status: DC | PRN
  Administered 2017-03-18: 1 via RECTAL

## 2017-03-18 MED ORDER — ONDANSETRON HCL 4 MG/2ML IJ SOLN
INTRAMUSCULAR | Status: DC | PRN
  Administered 2017-03-18: 4 mg via INTRAVENOUS

## 2017-03-18 MED ORDER — ACETAMINOPHEN 10 MG/ML IV SOLN: 1000.0000 mg | Freq: Once | INTRAVENOUS | Status: DC | PRN

## 2017-03-18 MED ORDER — GLYCOPYRROLATE 0.2 MG/ML IV SOSY
PREFILLED_SYRINGE | INTRAVENOUS | Status: DC | PRN
  Administered 2017-03-18: 0.4 mg via INTRAVENOUS

## 2017-03-18 MED ORDER — HYDROMORPHONE HCL 1 MG/ML IJ SOLN: 0.2500 mg | INTRAMUSCULAR | Status: DC | PRN

## 2017-03-18 MED ORDER — MEPERIDINE HCL 50 MG/ML IJ SOLN: 6.2500 mg | INTRAMUSCULAR | Status: DC | PRN

## 2017-03-18 SURGICAL SUPPLY — 20 items
BAG URO CATCHER STRL LF (MISCELLANEOUS) ×3 IMPLANT
BASKET DAKOTA 1.9FR 11X120 (BASKET) IMPLANT
BASKET ZERO TIP NITINOL 2.4FR (BASKET) IMPLANT
CATH URET 5FR 28IN OPEN ENDED (CATHETERS) ×3 IMPLANT
CLOTH BEACON ORANGE TIMEOUT ST (SAFETY) ×3 IMPLANT
COVER FOOTSWITCH UNIV (MISCELLANEOUS) IMPLANT
COVER SURGICAL LIGHT HANDLE (MISCELLANEOUS) ×3 IMPLANT
GLOVE BIOGEL M STRL SZ7.5 (GLOVE) ×3 IMPLANT
GOWN STRL REUS W/TWL XL LVL3 (GOWN DISPOSABLE) ×3 IMPLANT
GUIDEWIRE ANG ZIPWIRE 038X150 (WIRE) IMPLANT
GUIDEWIRE STR DUAL SENSOR (WIRE) ×3 IMPLANT
MANIFOLD NEPTUNE II (INSTRUMENTS) ×3 IMPLANT
PACK CYSTO (CUSTOM PROCEDURE TRAY) ×3 IMPLANT
SHEATH ACCESS URETERAL 24CM (SHEATH) IMPLANT
SHEATH ACCESS URETERAL 54CM (SHEATH) IMPLANT
SHEATH URETERAL 12FRX35CM (MISCELLANEOUS) IMPLANT
STENT URET 6FRX24 CONTOUR (STENTS) ×3 IMPLANT
TUBING CONNECTING 10 (TUBING) ×2 IMPLANT
TUBING CONNECTING 10' (TUBING) ×1
WIRE COONS/BENSON .038X145CM (WIRE) IMPLANT

## 2017-03-18 NOTE — Progress Notes (Signed)
PACU Nursing Note: Discharge Note: Pt ready for DC to home post operative. Family here with pt. Pt able to ambulate to restroom utilizing cane, gait very steady, req min assistance. Pt tolerating PO fluids and crackers well. Cont to deny any pain, VSS. DC instructions rev with pt and family, copy of instructions provided. Discussed (1) pain management and to call MD if pain exceeds pain meds (2) discussed signs and symptoms of infection and able to ck temp at home (3) importance of handwashing (4) post op general anesthesia diet (5) activity at home post general anesthesia (6) importance of being safe since due to being a high fall risk for the first 24 hours post general anesthesia. Pt escorted to exit via wheelchair and assisted into POV. Teach Back method used and copy of instructions provided

## 2017-03-18 NOTE — Discharge Instructions (Signed)
DISCHARGE INSTRUCTIONS FOR KIDNEY STONE/URETERAL STENT   MEDICATIONS:  1.  Resume all your other meds from home - except do not take any extra narcotic pain meds that you may have at home.   ACTIVITY:  1. No strenuous activity x 1week  2. No driving while on narcotic pain medications  3. Drink plenty of water  4. Continue to walk at home - you can still get blood clots when you are at home, so keep active, but don't over do it.  5. May return to work/school tomorrow or when you feel ready   BATHING:  1. You can shower and we recommend daily showers   SIGNS/SYMPTOMS TO CALL:  Please call us if you have a fever greater than 101.5, uncontrolled nausea/vomiting, uncontrolled pain, dizziness, unable to urinate, bloody urine, chest pain, shortness of breath, leg swelling, leg pain, redness around wound, drainage from wound, or any other concerns or questions.   You can reach [place] at [phone].   FOLLOW-UP:  1. You will be seen in 5 months in clinic in preparation for stent exchange in 6 months

## 2017-03-18 NOTE — Transfer of Care (Signed)
Immediate Anesthesia Transfer of Care Note  Patient: [name] [name]  Procedure(s) Performed: CYSTOSCOPY WITH BILATERAL  RETROGRADE PYELOGRAM RIGHT URETERAL STENT [place] (Bilateral )  Patient Location: PACU  Anesthesia Type:General  Level of Consciousness: sedated  Airway & Oxygen Therapy: Patient Spontanous Breathing and Patient connected to face mask oxygen  Post-op Assessment: Report given to RN and Post -op Vital signs reviewed and stable  Post vital signs: Reviewed and stable  Last Vitals:  Vitals:   03/18/17 0846  BP: (!) 175/72  Pulse: (!) 54  Resp: 16  Temp: 36.7 C  SpO2: 95%    Last Pain:  Vitals:   03/18/17 0846  TempSrc: Oral         Complications: No apparent anesthesia complications

## 2017-03-18 NOTE — Op Note (Signed)
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 Atrophic nonfunctional left kidney  Postoperative diagnosis:  1. Same  Procedure: 1. Cystoscopy, bilateral retrograde pyelogram with interpretation 2. Right ureteral stent exchange  Surgeon: Ardis Hughs, MD  Anesthesia: General  Complications: None  Intraoperative findings:  #1: The right retrograde pyelogram demonstrated a ureteral stricture right at the iliac vessels with proximal hydronephrosis and significant medial deviation of the ureter.  Distal to the stricture the ureter was normal.  A 24 cm x6 French double-J stent was exchanged.  #2: The left retrograde pyelogram demonstrated a high-grade ureteral stricture at the level of the iliac vessels with severe proximal hydroureteronephrosis.  No stent was placed on this side given previous functional assessment of the left kidney.  EBL: Minimal  Specimens: None  Indication: [name] [name] is a 77 y.o. 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  She has had serial exchanges of the right ureteral stent.  The left stent was removed several years ago because of a nonfunctional kidney.  However, recently it was noted that there was a left ureteral jet on ultrasound.  We discussed obtaining a left retrograde pyelogram for more detailed or close evaluation.  After reviewing the management options for treatment, he elected to proceed with the above surgical procedure(s). We have discussed the potential benefits and risks of the procedure, side effects of the proposed treatment, the likelihood of the patient achieving the goals of the procedure, and any potential problems that might occur during the procedure or recuperation. Informed consent has been obtained.  Description of procedure:  The patient was taken to the operating room and general anesthesia was induced.  The patient was placed  in the dorsal lithotomy position, prepped and draped in the usual sterile fashion, and preoperative antibiotics were administered. A preoperative time-out was performed.   At 30 degrees 21 French cystoscope was gently passed through the patient's urethra into the bladder.  A 360 degrees cystoscopic evaluation was performed.  There were no significant mucosal abnormalities.  The bladder looked good without any significant ureteral edema or mucosal changes from the stent.  I grasped the stent emanating from the patient's right ureteral orifice at the distal aspect and pulled it to the urethral meatus.  I then advanced a 0.038 sensor wire through the stent and into the right renal pelvis under fluoroscopic guidance.  The stent was then easily removed.  There is no significant encrustation.  I then exchanged a wire for a 5 [place] open-ended ureteral catheter and pulled the catheter back to the distal ureter.  I then performed a retrograde pyelogram with the above findings.  I then re-placed the 0.038 sensor wire through the open-ended catheter and into the right renal pelvis.  I then passed a 24 cm x6 [place] double-J ureteral stent over the wire and placed in the upper pole under fluoroscopic guidance.  Once the stent was noted to be well positioned in the upper pole I advanced the stent to the urethral meatus and held the stent back while removing the wire.  I then advanced the scope back into the patient's bladder noting a nice curl within the bladder.  At this point we performed a left retrograde pyelogram using 10 cc of Omnipaque through 5 [place] open-ended ureteral catheter with the above findings.  Given the severe/high-grade stricture and proximal hydroureteronephrosis and then nonfunctional kidney I opted to not leave a stent.  The bladder was subsequently  emptied.  The scope was removed.  A B and O suppository was placed in the patient's rectum.  She was subsequently extubated to the PACU in stable  condition.  Disposition: The patient will be scheduled for follow-up in 5 months for preop for stent exchange in 6 months.  Ardis Hughs, M.D.

## 2017-03-18 NOTE — Anesthesia Procedure Notes (Signed)
Procedure Name: LMA Insertion Date/Time: 03/18/2017 10:10 AM Performed by: Lind Covert, CRNA Pre-anesthesia Checklist: Patient identified, Emergency Drugs available, Suction available, Patient being monitored and Timeout performed Patient Re-evaluated:Patient Re-evaluated prior to induction Oxygen Delivery Method: Circle system utilized Preoxygenation: Pre-oxygenation with 100% oxygen Induction Type: IV induction LMA: LMA inserted LMA Size: 4.0 Number of attempts: 1 Placement Confirmation: breath sounds checked- equal and bilateral and positive ETCO2 Tube secured with: Tape Dental Injury: Teeth and Oropharynx as per pre-operative assessment

## 2017-03-18 NOTE — H&P (Signed)
Ureteral Stricture  HPI: [name] [name] is a 77 year-old female established patient who is here for further eval and management of a ureteral stricture.  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 The left side was initially obstructed and managed with a stent. Differential function demonstrated little function and stent was removed. Right hydro developed in Fall 2016. No being managed with ureteral stents, not a good surgical candidate otherwise.   June 2018: Stent exchange last completed in December 2017. Pt has tolerated her stent well. No worsening LUTS. Denies right sided flank pain or discomfort. Denies recent fevers or infections. No recent abx treatment.   02/08/17: Here today for pre-op appointment for on-going 6 month ureteral stent exchange. She states being hospitalized for dehydration and urinary tract infection over the Thanksgiving holiday. She sought evaluation after having worsening lower back pain post-lumbar spine injection by her orthopedist. Patient wasn't discharged with any antimicrobials. I have yet to review hospital notes. She did have a renal ultrasound on 11/19 which is available in PACS. Afebrile today. Denies bothersome voiding symptoms. She has not had dysuria or gross hematuria.   Her problem has been present for a few years. She has had previous surgery as a possible source of her ureteral stricture. She has had chronic stent for treatment of their ureteral stricture.   She is currently not having trouble urinating. She is not having problems getting her urine stream started. The patient does not complain of urinary leakage. She is not urinating more frequently now than usual. She does not dribble at the end of urination.   She is not having flank pain. She does have a history of urinary infections. Her renal function is normal. She did not see blood in her urine.     ALLERGIES: None   MEDICATIONS: Crestor 20 mg tablet  Levothyroxine Sodium 88 mcg  tablet  Albuterol Sulfate 2.5 mg/3 ml (0.083 %) vial, nebulizer Inhalation  Aspirin 81 MG TABS Oral  Calcium/Vitamin D/Minerals TABS Oral  Ferrous Sulfate 325 (65 Fe) MG Oral Tablet Delayed Release Oral  Furosemide 20 mg tablet Oral  Hydrocodone-Acetaminophen 5 mg-325 mg tablet  Isosorbide Mononitrate Er 30 mg tablet, extended release 24 hr  Latanoprost 0.005 % drops  Levemir FlexPen 100 UNIT/ML SOLN Subcutaneous  Lidoderm 5 % adhesive patch, medicated  Lovaza 1 gram capsule  Metoprolol Succinate 25 mg tablet, extended release 24 hr Oral  Miralax  Novolog Flexpen 100 unit/ml insulin pen Subcutaneous  Omeprazole 20 mg capsule,delayed release Oral  Vitamin B-12 TABS Oral  Vitamin C TABS Oral  Vitamin D2 50,000 unit capsule Oral     GU PSH: Cystoscopy Insert Stent, Right - 09/17/2016, Right - 02/13/2016, 2017, 2017, 2014, 2014 Hysterectomy Unilat SO - 2014      [place] Notes: Cystoscopy With Insertion Of Ureteral Stent Right, Cystoscopy With Insertion Of Ureteral Stent Right, Cesarean Section, Hysterectomy, Coronary Artery Single Venous Bypass Graft, Cystoscopy With Insertion Of Ureteral Stent, Cystoscopy With Insertion Of Ureteral Stent Left   NON-GU PSH: CABG (coronary artery bypass grafting) - 2014 Cesarean Delivery Only - 2014 Cholecystectomy (open), 09/2015    GU PMH: Ureteral stricture, Right, 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 This is being managed withchronic indwelling stents. She is due to have her stent changed soon, her last stent was exchanged 6 months prior. 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 - 02/05/2016 Urinary Tract Inf, Unspec site, Urinary tract infection - 2017 Hydronephrosis  Unspec, Bilateral hydronephrosis - 2017, Hydronephrosis, left, - 2016 Renal cyst, Renal cyst, acquired - 2014      PMH Notes: The patient has a history of retroperitoneal fibrosis and in 2015  she had left sided hydronephrosis. She had a stent placed in the spring of 2015 followed by renogram demonstrating 18% function on the left kidney. Her stent was subsequently removed.  Recently she developed bilateral hydronephrosis, renogram demonstrated bilateral obstruction with 85% right function and 15% left sided function a significant change from her previous ultrasounds and renograms. In Jan 2015 she had no hydro or evidence of right sided obstruction.   02/21/15 bilateral RPGs- left side showed a normal distal ureter to the pelvic brim where it stopped abruptly and I was unable to opacify the proximal portion. On the right side showed a normal distal ureter with abrupt narrowing at the pelvic brim and proximal dilation. A right sided stent was placed.   She has a history of recurrent infections. In 2015 she had 4 Escherichia coli infections.   03/2015: treated for UTI - keflex - no growth. recommdended lactobacillus, as well as cranberry tablets twice daily   2/17: BUN/Cr- 30/1.78     NON-GU PMH: Encounter for general adult medical examination without abnormal findings, Encounter for preventive health examination - 2016 Personal history of other diseases of the circulatory system, History of hypertension - 2014, History of cardiac disorder, - 2014 Personal history of other endocrine, nutritional and metabolic disease, History of hypothyroidism - 2014, History of diabetes mellitus, - 2014, History of hypercholesterolemia, - 2014    FAMILY HISTORY: Diabetes - Mother Family Health Status Number - Father Father Deceased At Age12 ___ - Father Hypertension - Runs In Family Mother Deceased At Age 14 from diabetic complicati - Father nephrolithiasis - Runs In Family Transient Ischemic Attack - Mother   SOCIAL HISTORY: Marital Status: Single Preferred Language: English; Ethnicity: Not Hispanic Or Latino; Race: White     Notes: Former smoker, Tobacco use, Marital History - Single, Retired  From Work, Alcohol Use, Caffeine Use   REVIEW OF SYSTEMS:    GU Review Female:   Patient reports get up at night to urinate. Patient denies frequent urination, hard to postpone urination, burning /pain with urination, leakage of urine, stream starts and stops, trouble starting your stream, have to strain to urinate, and being pregnant.  Gastrointestinal (Upper):   Patient denies nausea, vomiting, and indigestion/ heartburn.  Gastrointestinal (Lower):   Patient denies diarrhea and constipation.  Constitutional:   Patient denies fever, night sweats, weight loss, and fatigue.  Skin:   Patient denies skin rash/ lesion and itching.  Eyes:   Patient denies blurred vision and double vision.  Ears/ Nose/ Throat:   Patient denies sore throat and sinus problems.  Hematologic/Lymphatic:   Patient denies swollen glands and easy bruising.  Cardiovascular:   Patient denies leg swelling and chest pains.  Respiratory:   Patient denies cough and shortness of breath.  Endocrine:   Patient denies excessive thirst.  Musculoskeletal:   Patient denies back pain and joint pain.  Neurological:   Patient denies headaches and dizziness.  Psychologic:   Patient denies depression and anxiety.   Notes: Reviewed previous review of systems 08/09/2016. No changes.   VITAL SIGNS:      02/08/2017 10:44 AM  Weight 137 lb / 62.14 kg  Height 66 in / 167.64 cm  BP 118/48 mmHg  Pulse 61 /min  Temperature 97.1 F / 36.1 C  BMI 22.1  kg/m   MULTI-SYSTEM PHYSICAL EXAMINATION:    Constitutional: Well-nourished. No physical deformities. Normally developed. Good grooming.  Neck: Neck symmetrical, not swollen. Normal tracheal position.  Respiratory: No labored breathing, no use of accessory muscles. CTA.  Cardiovascular: Normal temperature, normal extremity pulses, no swelling, no varicosities. RRR.  Lymphatic: No enlargement of neck, axillae, groin.  Skin: No paleness, no jaundice, no cyanosis. No lesion, no ulcer, no rash.   Neurologic / Psychiatric: Oriented to time, oriented to place, oriented to person. No depression, no anxiety, no agitation.  Gastrointestinal: No mass, no tenderness, no rigidity, non obese abdomen. No flank or suprapubic tenderness.  Musculoskeletal: Spine, ribs, pelvis no bilateral tenderness. Normal gait and station of head and neck.     PAST DATA REVIEWED:  Source Of History:  Patient  Records Review:   Previous Hospital Records, Previous Patient Records  Urine Test Review:   Urinalysis, Urine Culture  X-Ray Review: Renal Ultrasound: Reviewed Films.     PROCEDURES:          Urinalysis w/Scope Dipstick Dipstick Cont'd Micro  Color: Yellow Bilirubin: Neg WBC/hpf: 40 - 60/hpf  Appearance: Cloudy Ketones: Neg RBC/hpf: 0 - 2/hpf  Specific Gravity: 1.015 Blood: 1+ Bacteria: Mod (26-50/hpf)  pH: 5.5 Protein: Trace Cystals: NS (Not Seen)  Glucose: Neg Urobilinogen: 0.2 Casts: NS (Not Seen)    Nitrites: Neg Trichomonas: Not Present    Leukocyte Esterase: 2+ Mucous: Not Present      Epithelial Cells: 10 - 20/hpf      Yeast: NS (Not Seen)      Sperm: Not Present    ASSESSMENT:      ICD-10 Details  1 GU:   Ureteral stricture - N13.5 Right   PLAN:           Orders Labs Urine Culture          Schedule Return Visit/Planned Activity: 3 Weeks - Schedule Surgery          Document Letter(s):  Created for Patient: Clinical Summary         Notes:   I'll make Dr Louis Meckel aware of recent hospitalization and review the u/s with him. Further review leads me to believe she was hospitalized for dehydration and not a recurrent UTi. I'll order bilateral RPG's due to the ureteral patency noted on the left which was previously thought to be permanently and irreversibly obstructed. Urinalysis sent for culture to service preprocedural baseline lab value. I'll give green sheet to one of the schedulers later today with plan for her having her set up for exchange in the operating room with her  urologist within the next 30 days.

## 2017-03-18 NOTE — Anesthesia Postprocedure Evaluation (Signed)
Anesthesia Post Note  Patient: [name] [name]  Procedure(s) Performed: CYSTOSCOPY WITH BILATERAL  RETROGRADE PYELOGRAM RIGHT URETERAL STENT [place] (Bilateral )     Patient location during evaluation: PACU Anesthesia Type: General Level of consciousness: awake and alert Pain management: pain level controlled Vital Signs Assessment: post-procedure vital signs reviewed and stable Respiratory status: spontaneous breathing, nonlabored ventilation, respiratory function stable and patient connected to nasal cannula oxygen Cardiovascular status: blood pressure returned to baseline and stable Postop Assessment: no apparent nausea or vomiting Anesthetic complications: no    Last Vitals:  Vitals:   03/18/17 1146 03/18/17 1215  BP: (!) 154/66 (!) 162/78  Pulse: (!) 57 62  Resp: 16 16  Temp: 36.6 C   SpO2: 96% 97%    Last Pain:  Vitals:   03/18/17 0846  TempSrc: Oral                 Barnet Glasgow

## 2017-03-21 ENCOUNTER — Other Ambulatory Visit: Payer: Self-pay | Admitting: Endocrinology

## 2017-03-21 ENCOUNTER — Encounter (HOSPITAL_COMMUNITY): Payer: Self-pay | Admitting: Urology

## 2017-03-29 ENCOUNTER — Telehealth: Payer: Self-pay | Admitting: Endocrinology

## 2017-03-29 ENCOUNTER — Encounter: Payer: Self-pay | Admitting: Cardiovascular Disease

## 2017-03-29 NOTE — Telephone Encounter (Signed)
Patient needs script for Hydrocodone -60 pills sent to [facility] on [address]

## 2017-03-30 ENCOUNTER — Other Ambulatory Visit: Payer: Self-pay | Admitting: Endocrinology

## 2017-03-30 ENCOUNTER — Telehealth: Payer: Self-pay

## 2017-03-30 MED ORDER — HYDROCODONE-ACETAMINOPHEN 5-325 MG PO TABS: 1.0000 | ORAL_TABLET | Freq: Two times a day (BID) | ORAL | 0 refills | Status: DC | PRN

## 2017-03-30 NOTE — Telephone Encounter (Signed)
Left vm letting patinet her prescription is up front ready for pick-up

## 2017-03-30 NOTE — Telephone Encounter (Signed)
Prescription for hydrocodone is at the front ready for patient pick-up

## 2017-03-30 NOTE — Telephone Encounter (Signed)
Printed, unable to stand electronically

## 2017-03-31 ENCOUNTER — Other Ambulatory Visit: Payer: Self-pay | Admitting: Endocrinology

## 2017-04-01 ENCOUNTER — Other Ambulatory Visit: Payer: Self-pay | Admitting: Endocrinology

## 2017-04-01 DIAGNOSIS — N183 Chronic kidney disease, stage 3 unspecified: Secondary | ICD-10-CM

## 2017-04-01 DIAGNOSIS — E063 Autoimmune thyroiditis: Secondary | ICD-10-CM

## 2017-04-04 ENCOUNTER — Other Ambulatory Visit (INDEPENDENT_AMBULATORY_CARE_PROVIDER_SITE_OTHER): Payer: Medicare Other

## 2017-04-04 DIAGNOSIS — E063 Autoimmune thyroiditis: Secondary | ICD-10-CM

## 2017-04-04 DIAGNOSIS — N183 Chronic kidney disease, stage 3 unspecified: Secondary | ICD-10-CM

## 2017-04-04 LAB — CBC WITH DIFFERENTIAL/PLATELET
BASOS ABS: 0 10*3/uL (ref 0.0–0.1)
Basophils Relative: 0.7 % (ref 0.0–3.0)
EOS ABS: 0.2 10*3/uL (ref 0.0–0.7)
Eosinophils Relative: 2.7 % (ref 0.0–5.0)
HCT: 38.1 % (ref 36.0–46.0)
Hemoglobin: 12.2 g/dL (ref 12.0–15.0)
LYMPHS ABS: 1 10*3/uL (ref 0.7–4.0)
Lymphocytes Relative: 14.4 % (ref 12.0–46.0)
MCHC: 31.9 g/dL (ref 30.0–36.0)
MCV: 93.6 fl (ref 78.0–100.0)
MONO ABS: 0.4 10*3/uL (ref 0.1–1.0)
Monocytes Relative: 6.2 % (ref 3.0–12.0)
NEUTROS ABS: 5.2 10*3/uL (ref 1.4–7.7)
NEUTROS PCT: 76 % (ref 43.0–77.0)
PLATELETS: 205 10*3/uL (ref 150.0–400.0)
RBC: 4.07 Mil/uL (ref 3.87–5.11)
RDW: 15.3 % (ref 11.5–15.5)
WBC: 6.9 10*3/uL (ref 4.0–10.5)

## 2017-04-04 LAB — COMPREHENSIVE METABOLIC PANEL
ALK PHOS: 69 U/L (ref 39–117)
ALT: 9 U/L (ref 0–35)
AST: 14 U/L (ref 0–37)
Albumin: 3.4 g/dL — ABNORMAL LOW (ref 3.5–5.2)
BILIRUBIN TOTAL: 0.3 mg/dL (ref 0.2–1.2)
BUN: 38 mg/dL — AB (ref 6–23)
CO2: 20 mEq/L (ref 19–32)
Calcium: 9.3 mg/dL (ref 8.4–10.5)
Chloride: 111 mEq/L (ref 96–112)
Creatinine, Ser: 1.74 mg/dL — ABNORMAL HIGH (ref 0.40–1.20)
GFR: 30.24 mL/min — AB (ref >60.00)
GLUCOSE: 113 mg/dL — AB (ref 70–99)
Potassium: 5.7 mEq/L — ABNORMAL HIGH (ref 3.5–5.1)
SODIUM: 140 meq/L (ref 135–145)
TOTAL PROTEIN: 6.3 g/dL (ref 6.0–8.3)

## 2017-04-04 LAB — TSH: TSH: 4.57 u[IU]/mL — AB (ref 0.35–4.50)

## 2017-04-07 ENCOUNTER — Ambulatory Visit: Payer: Medicare Other | Admitting: Endocrinology

## 2017-04-08 ENCOUNTER — Ambulatory Visit: Payer: Medicare Other | Admitting: Cardiovascular Disease

## 2017-04-13 ENCOUNTER — Ambulatory Visit (INDEPENDENT_AMBULATORY_CARE_PROVIDER_SITE_OTHER): Payer: Medicare Other | Admitting: Cardiovascular Disease

## 2017-04-13 ENCOUNTER — Encounter: Payer: Self-pay | Admitting: Cardiovascular Disease

## 2017-04-13 ENCOUNTER — Telehealth: Payer: Self-pay | Admitting: Endocrinology

## 2017-04-13 DIAGNOSIS — Z951 Presence of aortocoronary bypass graft: Secondary | ICD-10-CM | POA: Diagnosis not present

## 2017-04-13 DIAGNOSIS — I10 Essential (primary) hypertension: Secondary | ICD-10-CM | POA: Diagnosis not present

## 2017-04-13 DIAGNOSIS — I714 Abdominal aortic aneurysm, without rupture, unspecified: Secondary | ICD-10-CM

## 2017-04-13 DIAGNOSIS — I6523 Occlusion and stenosis of bilateral carotid arteries: Secondary | ICD-10-CM | POA: Diagnosis not present

## 2017-04-13 DIAGNOSIS — I739 Peripheral vascular disease, unspecified: Secondary | ICD-10-CM | POA: Diagnosis not present

## 2017-04-13 DIAGNOSIS — E875 Hyperkalemia: Secondary | ICD-10-CM

## 2017-04-13 NOTE — Assessment & Plan Note (Signed)
History of PAD with minimal claudication and lower extremity arterial Doppler studies performed 10/19/16 revealing a right ABI 0.75 and a left of 1.2. She did have high frequency signals in both distal common femoral arteries.

## 2017-04-13 NOTE — Assessment & Plan Note (Signed)
History of essential hypertension blood pressure measured at 145/63. She is on Toprol. Continue current meds at current dosing.

## 2017-04-13 NOTE — Progress Notes (Signed)
04/13/2017 [name] [name]   June 30, 1940  [id number]  Primary Physician [name], MD Primary Cardiologist: [name] MD FACP, [place], [place], [place]  HPI:  [name] [name] is a 77 y.o.  mildly overweight, divorced Caucasian female, mother of 1 child who I last saw in the office  03/17/16. She has a history of CAD status post coronary artery bypass grafting x6 in 1997. She has had bilateral carotid endarterectomies, aortobifemoral bypass grafting and right fem-pop bypass grafting with lifestyle-limiting claudication. Cath performed by Dr. [name] December 09, 2009, revealed an occluded vein to an OM and ramus branches with a patent vein to a PDA and a patent LIMA to the LAD. Her EF was 40% at that time. She quit smoking November 2011. She had an echo performed March 25, 2010, that showed normal LV systolic function and a Myoview performed November 24, 2011, which was nonischemic. She denies chest pain or shortness of breath. Her last lower extremity Dopplers performed in our office October 05, 2011, revealed a right ABI of 0.45 with an occluded fem-pop bypass graft and a left ABI of 0.59 with an occluded left SFA.since I saw her back 7 months ago she's remained clinically stable. She denies chest pain or shortness of breath. Her lower extremity arterial Dopplers show progression of disease on the right but though I do not think she is revascularizable given her comorbidities. She denies claudication. She was recently admitted with cholecystitis and underwent laparoscopic cholecystectomy. She is slowly recovering from this. A 2-D echocardiogram revealed an ejection fraction of 55% performed 08/29/15. She was hospitalized 01/23/16 with acute congestive heart failure and a flutter converted to sinus rhythm. EF at that time was 20-25%. He was elected not to anticoagulate her because of history of GI bleed nor to pursue cardiac catheterization. Since that time she denies chest pain or shortness of  breath. Since I saw her a year ago she denies chest pain, shortness of breath or claudication. She had carotid Dopplers revealed occluded right carotid with moderate left ICA stenosis and lower extremity Dopplers to reveal a right ABI 0.75 for left of 1.2 with high frequency signals in both common femoral arteries.     Current Meds  Medication Sig  . acetaminophen (TYLENOL) 650 MG CR tablet Take 1,300 mg by mouth every 8 (eight) hours as needed for pain.  Marland Kitchen albuterol (PROVENTIL HFA;VENTOLIN HFA) 108 (90 Base) MCG/ACT inhaler Inhale 2 puffs every 6 (six) hours as needed into the lungs for wheezing or shortness of breath.  Marland Kitchen aspirin 81 MG chewable tablet Chew 81 mg by mouth daily.   . B-D ULTRAFINE III SHORT PEN 31G X 8 MM MISC USE TO INJECT INSULIN FOUR TIMES DAILY  . calcium-vitamin D (OSCAL WITH D) 500-200 MG-UNIT per tablet Take 1 tablet by mouth every morning.  . ferrous sulfate 325 (65 FE) MG tablet Take 325 mg by mouth every evening.   Marland Kitchen glucose blood (ONE TOUCH ULTRA TEST) test strip Use to test blood sugar 1-2 times daily Dx code- E11.9  . HYDROcodone-acetaminophen (NORCO/VICODIN) 5-325 MG tablet Take 1 tablet by mouth 2 (two) times daily as needed for moderate pain or severe pain.  Marland Kitchen insulin aspart (NOVOLOG FLEXPEN) 100 UNIT/ML FlexPen Inject 3 Units into the skin 3 (three) times daily with meals. (Patient taking differently: Inject 2 Units into the skin 3 (three) times daily with meals. )  . insulin detemir (LEVEMIR) 100 UNIT/ML injection Inject 10 Units into the skin  at bedtime.  . isosorbide mononitrate (IMDUR) 30 MG 24 hr tablet Take 1 tablet (30 mg total) daily by mouth.  . latanoprost (XALATAN) 0.005 % ophthalmic solution Place 1 drop into both eyes at bedtime.  Marland Kitchen levothyroxine (SYNTHROID, LEVOTHROID) 88 MCG tablet Take 1 tablet (88 mcg total) by mouth daily.  Marland Kitchen lidocaine (LIDODERM) 5 % Place 1 patch onto the skin daily. Remove & Discard patch within 12 hours or as directed by MD  (Patient taking differently: Place 1 patch onto the skin daily as needed (pain). Remove & Discard patch within 12 hours or as directed by MD)  . Loperamide HCl (IMODIUM A-D PO) Take by mouth as needed.  . metoprolol succinate (TOPROL-XL) 25 MG 24 hr tablet TAKE 1 TABLET DAILY (Patient taking differently: TAKE 1 TABLET  BY MOUTH ONCE DAILY)  . omeprazole (PRILOSEC) 20 MG capsule TAKE 1 CAPSULE DAILY  . polyethylene glycol (MIRALAX / GLYCOLAX) packet Take 17 g by mouth daily as needed for moderate constipation.  . rosuvastatin (CRESTOR) 20 MG tablet TAKE 1 TABLET DAILY  . vitamin B-12 (CYANOCOBALAMIN) 1000 MCG tablet Take 1,000 mcg by mouth daily.  . vitamin C (ASCORBIC ACID) 500 MG tablet Take 500 mg by mouth at bedtime.   . Vitamin D, Ergocalciferol, (DRISDOL) 50000 units CAPS capsule TAKE 1 CAPSULE BY MOUTH EVERY 14 DAYS (Patient taking differently: Take 50,000 Units by mouth every 14 (fourteen) days. TAKE 1 CAPSULE BY MOUTH EVERY 14 DAYS)     No Known Allergies  Social History   Socioeconomic History  . Marital status: Divorced    Spouse name: Not on file  . Number of children: 1  . Years of education: 49  . Highest education level: Not on file  Social Needs  . Financial resource strain: Not on file  . Food insecurity - worry: Not on file  . Food insecurity - inability: Not on file  . Transportation needs - medical: Not on file  . Transportation needs - non-medical: Not on file  Occupational History  . Not on file  Tobacco Use  . Smoking status: Former Smoker    Packs/day: 2.00    Years: 30.00    Pack years: 60.00    Last attempt to quit: 06/07/2009    Years since quitting: 7.8  . Smokeless tobacco: Never Used  Substance and Sexual Activity  . Alcohol use: Yes    Alcohol/week: 1.8 oz    Types: 3 Standard drinks or equivalent per week    Comment: ocassional  . Drug use: No  . Sexual activity: Not on file  Other Topics Concern  . Not on file  Social History Narrative    Lives alone   Worked 37 yrs [employer]   1 daughter     Review of Systems: General: negative for chills, fever, night sweats or weight changes.  Cardiovascular: negative for chest pain, dyspnea on exertion, edema, orthopnea, palpitations, paroxysmal nocturnal dyspnea or shortness of breath Dermatological: negative for rash Respiratory: negative for cough or wheezing Urologic: negative for hematuria Abdominal: negative for nausea, vomiting, diarrhea, bright red blood per rectum, melena, or hematemesis Neurologic: negative for visual changes, syncope, or dizziness All other systems reviewed and are otherwise negative except as noted above.    Blood pressure (!) 145/63, pulse (!) 54, height 5\' 6"  (1.676 m), weight 140 lb 9.6 oz (63.8 kg), SpO2 98 %.  General appearance: alert and no distress Neck: no adenopathy, no JVD, supple, symmetrical, trachea midline, thyroid  not enlarged, symmetric, no tenderness/mass/nodules and Bilateral carotid bruits left lateral right Lungs: clear to auscultation bilaterally Heart: regular rate and rhythm, S1, S2 normal, no murmur, click, rub or gallop Extremities: extremities normal, atraumatic, no cyanosis or edema Pulses: Diminished pedal pulses bilaterally Skin: Skin color, texture, turgor normal. No rashes or lesions Neurologic: Alert and oriented X 3, normal strength and tone. Normal symmetric reflexes. Normal coordination and gait  EKG not performed today  ASSESSMENT AND PLAN:   HTN (hypertension) History of essential hypertension blood pressure measured at 145/63. She is on Toprol. Continue current meds at current dosing.  Hx of CABG '97 History of coronary artery bypass grafting 6 in 1997. Her last Myoview performed in 2013 was nonischemic. She denies chest pain or shortness of breath.  AAA (abdominal aortic aneurysm) (HCC) History of abdominal aortic aneurysm status post aortobifemoral bypass grafting along with right femoropopliteal  bypass grafting because of claudication.  Claudication in peripheral vascular disease ([place]) History of PAD with minimal claudication and lower extremity arterial Doppler studies performed 10/19/16 revealing a right ABI 0.75 and a left of 1.2. She did have high frequency signals in both distal common femoral arteries.  Carotid artery disease (HCC) History of carotid artery disease status post bilateral carotid endarterectomies remotely with recent carotid Doppler study performed 07/02/16 revealing an occluded right carotid both common and internal and moderate left ICA stenosis. We will recheck in one year.      [name] MD FACP,FACC,FAHA, [facility] 04/13/2017 3:38 PM

## 2017-04-13 NOTE — Telephone Encounter (Signed)
Please tell her that her potassium was high and I need to have it checked again this week

## 2017-04-13 NOTE — Patient Instructions (Signed)
Medication Instructions: Your physician recommends that you continue on your current medications as directed. Please refer to the Current Medication list given to you today.   Testing/Procedures: Your physician has requested that you have a carotid duplex due in May. This test is an ultrasound of the carotid arteries in your neck. It looks at blood flow through these arteries that supply the brain with blood. Allow one hour for this exam. There are no restrictions or special instructions.  Due in August: Your physician has requested that you have a lower extremity arterial duplex. During this test, ultrasound is used to evaluate arterial blood flow in the legs. Allow one hour for this exam. There are no restrictions or special instructions.  Your physician has requested that you have an ankle brachial index (ABI). During this test an ultrasound and blood pressure cuff are used to evaluate the arteries that supply the arms and legs with blood. Allow thirty minutes for this exam. There are no restrictions or special instructions.  Follow-Up: Your physician wants you to follow-up in: 1 year with Dr. Gwenlyn Found. You will receive a reminder letter in the mail two months in advance. If you don't receive a letter, please call our office to schedule the follow-up appointment.  If you need a refill on your cardiac medications before your next appointment, please call your pharmacy.

## 2017-04-13 NOTE — Assessment & Plan Note (Signed)
History of coronary artery bypass grafting 6 in 1997. Her last Myoview performed in 2013 was nonischemic. She denies chest pain or shortness of breath.

## 2017-04-13 NOTE — Assessment & Plan Note (Signed)
History of abdominal aortic aneurysm status post aortobifemoral bypass grafting along with right femoropopliteal bypass grafting because of claudication.

## 2017-04-13 NOTE — Assessment & Plan Note (Signed)
History of carotid artery disease status post bilateral carotid endarterectomies remotely with recent carotid Doppler study performed 07/02/16 revealing an occluded right carotid both common and internal and moderate left ICA stenosis. We will recheck in one year.

## 2017-04-14 NOTE — Telephone Encounter (Signed)
Called patient and she asked if any of her medications are causing her potassium to elevate?  Patient asked if she could eat certain proteins other than chicken such as eggs and pork?  Also are there any foods she needs to avoid? She stated she has not been eating bananas or fruit, no fish or beef, and she drinks diet coke. I advised to her to make sure she is drinking water to stay hydrated and she stated that she is not drinking hardly much water.  Patient also stated that she is going out of town and she will be able to go to [facility] on Wednesday of next week to have her potassium rechecked.  Please advise.

## 2017-04-14 NOTE — Telephone Encounter (Signed)
Patient is asking you to call her concerning the lab work. Please advise

## 2017-04-14 NOTE — Telephone Encounter (Signed)
High potassium is not from medications and if it continues and will need to have her see a nephrologist

## 2017-04-14 NOTE — Telephone Encounter (Signed)
Pt needs to ask some questions about the potasium in her blood,  Please advise

## 2017-04-14 NOTE — Telephone Encounter (Signed)
Please advise on foods. Please see previous message.

## 2017-04-14 NOTE — Telephone Encounter (Signed)
She can pick up the printout of high potassium foods from my office

## 2017-04-14 NOTE — Telephone Encounter (Signed)
Called patient and left a voice message and let her know that her potassium was high and that Dr. Dwyane Dee wants her to come in for another potassium test this week and gave her our phone number to call to get added to the lab schedule or she can go to [facility] [place] as a walk in if she wants.

## 2017-04-15 NOTE — Telephone Encounter (Signed)
Yes to all. 

## 2017-04-15 NOTE — Telephone Encounter (Signed)
Called patient and she stated that she has this list and there are some foods not listed.  She is asking if pork, eggs, and vegetables are okay to eat? She also asked about regular white bread is okay to eat? She has cut back on several of the foods on the list.  Please advise.

## 2017-04-18 NOTE — Telephone Encounter (Signed)
Called patient and left a voice message to let her know that pork, eggs, vegetables, and white bread are okay to eat per Dr. Dwyane Dee.

## 2017-04-19 ENCOUNTER — Other Ambulatory Visit (INDEPENDENT_AMBULATORY_CARE_PROVIDER_SITE_OTHER): Payer: Medicare Other

## 2017-04-19 DIAGNOSIS — E875 Hyperkalemia: Secondary | ICD-10-CM

## 2017-04-19 LAB — POTASSIUM: Potassium: 5.1 mEq/L (ref 3.5–5.1)

## 2017-04-19 NOTE — Progress Notes (Signed)
Please call to let patient know that the lab results are normal and no further action needed

## 2017-04-21 NOTE — Telephone Encounter (Signed)
Spoke to patient. Gave results and verified previous vmail left received. Pt stated received previous vmail and verbalized understanding of lab results.

## 2017-04-21 NOTE — Telephone Encounter (Signed)
-----   Message from [name], MD sent at 04/19/2017 12:34 PM EST ----- Please call to let patient know that the lab results are normal and no further action needed

## 2017-04-29 ENCOUNTER — Encounter: Payer: Self-pay | Admitting: Endocrinology

## 2017-04-29 ENCOUNTER — Ambulatory Visit (INDEPENDENT_AMBULATORY_CARE_PROVIDER_SITE_OTHER): Payer: Medicare Other | Admitting: Endocrinology

## 2017-04-29 VITALS — BP 130/70 | HR 62 | Ht 66.0 in | Wt 138.4 lb

## 2017-04-29 DIAGNOSIS — N183 Chronic kidney disease, stage 3 unspecified: Secondary | ICD-10-CM

## 2017-04-29 DIAGNOSIS — E119 Type 2 diabetes mellitus without complications: Secondary | ICD-10-CM

## 2017-04-29 DIAGNOSIS — D508 Other iron deficiency anemias: Secondary | ICD-10-CM

## 2017-04-29 DIAGNOSIS — E063 Autoimmune thyroiditis: Secondary | ICD-10-CM

## 2017-04-29 DIAGNOSIS — I6523 Occlusion and stenosis of bilateral carotid arteries: Secondary | ICD-10-CM

## 2017-04-29 DIAGNOSIS — E875 Hyperkalemia: Secondary | ICD-10-CM | POA: Diagnosis not present

## 2017-04-29 MED ORDER — INSULIN PEN NEEDLE 32G X 4 MM MISC: 1 refills | Status: AC

## 2017-04-29 NOTE — Progress Notes (Signed)
Patient ID: [name] [name], female   DOB: Apr 09, 1940, 77 y.o.   MRN: [id number]   Reason for Appointment: Follow-up of various problems  History of Present Illness    RENAL dysfunction:  Renal function is variable,  appears to be again worse, previous creatinine generally stable about 1.5 Does not have diabetic nephropathy  Her only blood pressure treatment is metoprolol from cardiologist  She was told to stop her Lasix because of her worsening renal function, he is not having any edema from this  HYPERKALEMIA:  She was told to cut back on high potassium foods and given a list of high potassium foods Her compliance with this is usually fairly good Potassium is back to normal recently although still upper normal She thinks that this is also from increasing water intake    Lab Results  Component Value Date   CREATININE 1.74 (H) 04/04/2017   CREATININE 1.75 (H) 03/15/2017   CREATININE 1.63 (H) 03/10/2017   CREATININE 2.41 (H) 02/22/2017   Lab Results  Component Value Date   CREATININE 1.74 (H) 04/04/2017   BUN 38 (H) 04/04/2017   NA 140 04/04/2017   K 5.1 04/19/2017   CL 111 04/04/2017   CO2 20 04/04/2017     Type 2 DIABETES mellitus, date of diagnosis: 1986.   The insulin regimen is: Levemir 10 units hs, Novolog 3 ac twice a day   Type 2 diabetes  has been treated in the last few years with low dose basal bolus insulin regimen. She has not been taking  any oral hypoglycemic drugs   A1c Is ranging from 5.9-6.3 but is now lower at 5.4 in January  Current management and problems:  She did not bring her monitor  However she thinks her blood sugars are fairly close to normal usually  Her weight has been down previously after hospitalization but is coming back up and stable this month  She is very consistent with taking her insulin as directed  No hypoglycemia reported  She is not able to exercise because of back pain  Side effects from  medications: Diarrhea from metformin and nausea and vomiting from GLP-1 drugs   Monitors blood glucose: Less than 1 times a day  Glucometer: One Touch.   Blood sugar readings by recall:  98-125 in the morning 145 after meals   Meals: she is usually eating low fat meals especially recently meals usually at 11 AM,and supper at 5 pm.  Calorie intake: Usually controlled. Moderate Carbs Physical activity: exercise: Minimal  Dietician visit: Most recent:, 5/13.   Wt Readings from Last 3 Encounters:  04/29/17 138 lb 6.4 oz (62.8 kg)  04/13/17 140 lb 9.6 oz (63.8 kg)  03/18/17 135 lb (61.2 kg)    Lab Results  Component Value Date   HGBA1C 5.4 03/15/2017   HGBA1C 6.3 12/27/2016   HGBA1C 6.0 09/23/2016   Lab Results  Component Value Date   MICROALBUR 19.4 (H) 12/27/2016   LDLCALC 60 05/21/2016   CREATININE 1.74 (H) 04/04/2017    ANEMIA:  Taking iron supplement, last iron saturation was low  Hemoglobin has been mildly low previously but now normal   Lab Results  Component Value Date   WBC 6.9 04/04/2017   HGB 12.2 04/04/2017   HCT 38.1 04/04/2017   MCV 93.6 04/04/2017   PLT 205.0 04/04/2017      HYPOTHYROIDISM: She has had long-standing primary hypothyroidism She is taking Levothyroxine, currently 88 g daily She takes this consistently in  the morning without any iron at the same time   She does feel fairly good overall without new fatigue However TSH is higher than usual Her weight has also gone up somewhat  Lab Results  Component Value Date   TSH 4.57 (H) 04/04/2017   TSH 1.14 12/27/2016   TSH 0.64 09/23/2016   FREET4 1.20 12/27/2016   FREET4 1.44 09/23/2016   FREET4 1.31 07/26/2016    OTHER active problems: See review of systems    Allergies as of 04/29/2017   No Known Allergies     Medication List        Accurate as of 04/29/17  3:12 PM. Always use your most recent med list.          acetaminophen 650 MG CR tablet Commonly known as:   TYLENOL Take 1,300 mg by mouth every 8 (eight) hours as needed for pain.   albuterol 108 (90 Base) MCG/ACT inhaler Commonly known as:  PROVENTIL HFA;VENTOLIN HFA Inhale 2 puffs every 6 (six) hours as needed into the lungs for wheezing or shortness of breath.   aspirin 81 MG chewable tablet Chew 81 mg by mouth daily.   calcium-vitamin D 500-200 MG-UNIT tablet Commonly known as:  OSCAL WITH D Take 1 tablet by mouth every morning.   ferrous sulfate 325 (65 FE) MG tablet Take 325 mg by mouth every evening.   glucose blood test strip Commonly known as:  ONE TOUCH ULTRA TEST Use to test blood sugar 1-2 times daily Dx code- E11.9   HYDROcodone-acetaminophen 5-325 MG tablet Commonly known as:  NORCO/VICODIN Take 1 tablet by mouth 2 (two) times daily as needed for moderate pain or severe pain.   IMODIUM A-D PO Take by mouth as needed.   insulin aspart 100 UNIT/ML FlexPen Commonly known as:  NOVOLOG FLEXPEN Inject 3 Units into the skin 3 (three) times daily with meals.   Insulin Pen Needle 32G X 4 MM Misc Use 4 times daily   isosorbide mononitrate 30 MG 24 hr tablet Commonly known as:  IMDUR Take 1 tablet (30 mg total) daily by mouth.   latanoprost 0.005 % ophthalmic solution Commonly known as:  XALATAN Place 1 drop into both eyes at bedtime.   LEVEMIR 100 UNIT/ML injection Generic drug:  insulin detemir Inject 10 Units into the skin at bedtime.   levothyroxine 88 MCG tablet Commonly known as:  SYNTHROID, LEVOTHROID Take 1 tablet (88 mcg total) by mouth daily.   lidocaine 5 % Commonly known as:  LIDODERM Place 1 patch onto the skin daily. Remove & Discard patch within 12 hours or as directed by MD   magnesium gluconate 500 MG tablet Commonly known as:  MAGONATE Take 500 mg by mouth daily.   metoprolol succinate 25 MG 24 hr tablet Commonly known as:  TOPROL-XL TAKE 1 TABLET DAILY   omeprazole 20 MG capsule Commonly known as:  PRILOSEC TAKE 1 CAPSULE DAILY     polyethylene glycol packet Commonly known as:  MIRALAX / GLYCOLAX Take 17 g by mouth daily as needed for moderate constipation.   rosuvastatin 20 MG tablet Commonly known as:  CRESTOR TAKE 1 TABLET DAILY   vitamin B-12 1000 MCG tablet Commonly known as:  CYANOCOBALAMIN Take 1,000 mcg by mouth daily.   vitamin C 500 MG tablet Commonly known as:  ASCORBIC ACID Take 500 mg by mouth at bedtime.   Vitamin D (Ergocalciferol) 50000 units Caps capsule Commonly known as:  DRISDOL TAKE 1 CAPSULE BY MOUTH EVERY 14  DAYS       Allergies: No Known Allergies  Past Medical History:  Diagnosis Date  . AAA (abdominal aortic aneurysm) ([place])   . Bilateral hydronephrosis   . Carotid artery stenosis    carotid doppler 06/2012 - Right CCA/Bulb/ICA with chronic occlusion; L vertebral artery with abnormal blood flow; L Bulb/Prox ICA  s/p endarterectomy with mild fibrous plaque, 50% diameter reduction  . CHF (congestive heart failure) ([place])   . Choledocholithiasis 2017  . CKD (chronic kidney disease), stage III ([place])   . Coronary artery disease due to lipid rich plaque cardiologist-  dr berry   s/p CABG x6 1997-- cath 12-09-2009 occluded vein to obtuse marginal branch and ramus branch with patent vien to PDA and patent LIMA to LAD, ef 40%-- Myoview 11-24-2011, nonischemic  . Diverticulosis   . Dyspnea on exertion   . GERD (gastroesophageal reflux disease)   . GI bleed   . History of sepsis    10-18-2014 w/ acute pyelonephritis  . Hyperlipidemia   . Hypertension   . Hypothyroidism   . Ischemic cardiomyopathy    03-25-2010-- per lasts echo EF  50-55%  . PAD (peripheral artery disease) ([place])    09/2010 LEAs - R ABI of 0.45, occluded fem-pop bypass graft, L ABI of 0.59 with occluded SFA; severe arterial insuff  . PVD (peripheral vascular disease) with claudication ([place])    last duplex 07-04-2015 -- Right CCA and ICA chronic occlusion, 92-42% LICA, Patent vertebral arteries w/ antegrade flow,  bilateral normal subclavian arteries   . Retroperitoneal fibrosis   . Tubular adenoma of colon   . Type 2 diabetes mellitus ([place])    monitored by dr Dwyane Dee    Past Surgical History:  Procedure Laterality Date  . AORTA - BILATERAL FEMORAL ARTERY BYPASS GRAFT  1997   and RIGHT FEM-POP   . CARDIAC CATHETERIZATION  12-09-2009  dr al little   EF >40%-- occluded vein to OM & ramus branches, patent vein to PDA, patent LIMA to LAD (Dr. [name], [facility]) - later had thrombectomy of R fem-pop bypass ad R common femoral & profunda femoris artery (Dr. Oneida Alar)  . CARDIOVASCULAR STRESS TEST  11-24-2011   dr berry   Low Risk study: fixed basal to mid inferior attenuation artifact, no reversible ischemia,  normal LV function and wall motion , ef 67%  . CAROTID ENDARTERECTOMY Bilateral right 1994//  left ?  Marland Kitchen CATARACT EXTRACTION W/ INTRAOCULAR LENS  IMPLANT, BILATERAL  2006  . CHOLECYSTECTOMY N/A 09/05/2015   Procedure: ATTEMPTED LAPAROSCOPIC CHOLECYSTECTOMY, EXPLORATORY LAPAROTOMY WITH CHOLECYSTECTOMY;  Surgeon: Autumn Messing III, MD;  Location: [place];  Service: General;  Laterality: N/A;  . COLONOSCOPY    . COLONOSCOPY  10/21/2015  . COLONOSCOPY WITH PROPOFOL N/A 10/21/2015   Procedure: COLONOSCOPY WITH PROPOFOL;  Surgeon: Milus Banister, MD;  Location: [place];  Service: Endoscopy;  Laterality: N/A;  . CORONARY ARTERY BYPASS GRAFT  1997   x6; internal mammary to LAD, SVG to ramus #1 & #2, SVG to OM, SVG to PDA,   . CYSTOSCOPY W/ RETROGRADES Right 08/01/2015   Procedure: CYSTOSCOPY WITH RETROGRADE PYELOGRAM;  Surgeon: Ardis Hughs, MD;  Location: [facility];  Service: Urology;  Laterality: Right;  . CYSTOSCOPY W/ URETERAL STENT PLACEMENT  03/10/2012   Procedure: CYSTOSCOPY WITH RETROGRADE PYELOGRAM/URETERAL STENT PLACEMENT;  Surgeon: Hanley Ben, MD;  Location: [facility];  Service: Urology;  Laterality: Left;  . CYSTOSCOPY W/ URETERAL STENT PLACEMENT Bilateral 03/07/2015   Procedure:  BILATERAL RETROGRADE PYELOGRAM AND RIGHT URETERAL STENT PLACEMENT;  Surgeon: Ardis Hughs, MD;  Location[facility];  Service: Urology;  Laterality: Bilateral;  . CYSTOSCOPY W/ URETERAL STENT PLACEMENT Right 08/01/2015   Procedure: CYSTOSCOPY WITH STENT REPLACEMENT;  Surgeon: Ardis Hughs, MD;  Location: [facility];  Service: Urology;  Laterality: Right;  . CYSTOSCOPY W/ URETERAL STENT PLACEMENT Right 02/13/2016   Procedure: RIGHT URETERAL STENT EXCHANGE;  Surgeon: Ardis Hughs, MD;  Location: [facility];  Service: Urology;  Laterality: Right;  . CYSTOSCOPY W/ URETERAL STENT PLACEMENT Right 09/17/2016   Procedure: CYSTOSCOPY WITH RIGHT  RETROGRADE PYELOGRAM RIGHT URETERAL STENT EXCHANGE;  Surgeon: Ardis Hughs, MD;  Location: [facility];  Service: Urology;  Laterality: Right;  . CYSTOSCOPY W/ URETERAL STENT PLACEMENT Bilateral 03/18/2017   Procedure: CYSTOSCOPY WITH BILATERAL  RETROGRADE PYELOGRAM RIGHT URETERAL STENT [place];  Surgeon: Ardis Hughs, MD;  Location: [facility];  Service: Urology;  Laterality: Bilateral;  . ERCP N/A 09/03/2015   Procedure: ENDOSCOPIC RETROGRADE CHOLANGIOPANCREATOGRAPHY (ERCP);  Surgeon: Irene Shipper, MD;  Location: [facility];  Service: Endoscopy;  Laterality: N/A;  . ESOPHAGOGASTRODUODENOSCOPY    . ESOPHAGOGASTRODUODENOSCOPY N/A 10/19/2015   Procedure: ESOPHAGOGASTRODUODENOSCOPY (EGD);  Surgeon: Gatha Mayer, MD;  Location: [facility];  Service: Endoscopy;  Laterality: N/A;  . GIVENS CAPSULE STUDY  10/21/2015  . GIVENS CAPSULE STUDY N/A 10/21/2015   Procedure: GIVENS CAPSULE STUDY;  Surgeon: Milus Banister, MD;  Location: [place];  Service: Endoscopy;  Laterality: N/A;  . REPAIR RIGHT FEMORAL PSEUDOANEUYSM/  RIGHT FEM-POP BYPASS GRAFT/  DEBRIDEMENT RIGHT LOWER EXTREMITIY VENOUS STATUS ULCERS X2  01-12-2005  . TOTAL ABDOMINAL HYSTERECTOMY W/ BILATERAL SALPINGOOPHORECTOMY  1986  . TRANSTHORACIC  ECHOCARDIOGRAM  03/25/2010   EF 83-38%, LV systolic function low normal with mild inferoseptal hypocontractility; LA mildly dilated; mod MR; mild TR, RV systolic pressure elevated, mild pulm HTN; AV mildly sclerotic; mild pulm valve regurg; aortic root sclerosis/calcif     Family History  Problem Relation Age of Onset  . Congestive Heart Failure Mother   . Diabetes Mother   . Stroke Father   . Cancer Maternal Aunt        Breast cancer    Social History:  reports that she quit smoking about 7 years ago. She has a 60.00 pack-year smoking history. she has never used smokeless tobacco. She reports that she drinks about 1.8 oz of alcohol per week. She reports that she does not use drugs.  Review of Systems -     Hyperlipidemia: Has history of high LDL and triglycerides/low HDL  Was changed from Lipitor to Crestor because of higher LDL, tolerating this well    LDL is Controlled Tends to have high triglycerides, not clear why her level is over 300, previously 167 She does not think she is eating larger amounts of carbohydrates or higher fat meals and no unusual food intake the night before Her cardiologist wants her to take Lovaza 1 g twice a day which she has not started yet   Lab Results  Component Value Date   CHOL 131 12/27/2016   HDL 26.80 (L) 12/27/2016   LDLCALC 60 05/21/2016   LDLDIRECT 60.0 12/27/2016   TRIG 280.0 (H) 12/27/2016   CHOLHDL 5 12/27/2016     Vitamin D deficiency: She has been on supplements and taking her 50,000 units every other week  Her level is normal this year  Her orthopedic doctor is treating her for the  low back pain which is chronic    History of UTI:  Needs a stent replaced next month, follows with urologist regularly   CHF: She was admitted with shortness of breath and orthopnea on 01/23/16 This has been followed by cardiologist She has a low ejection fraction of 20-25 %  Currently on metoprolol and 20 mg Lasix every second day Also she had  atrial flutter which has resolved   She feels fairly good, no dyspnea now  Examination:   BP 130/70 (BP Location: Left Arm, Patient Position: Sitting, Cuff Size: Normal)   Pulse 62   Ht 5\' 6"  (1.676 m)   Wt 138 lb 6.4 oz (62.8 kg)   SpO2 95%   BMI 22.34 kg/m   Body mass index is 22.34 kg/m.     Standing blood pressure 85/45  Lungs clear No ankle edema present   Assesment/PLAN:    RENAL insufficiency:   This is chronic and now appears to be relatively worse With her weight loss and orthostatic hypotension she is likely getting overdiuresis Currently on only metoprolol for blood pressure/cardiac benefits  Recommended we stop her Lasix and take it only as needed when she has any dyspnea Needs to come back in 10 days for repeat renal functions She is also going to get her renal stent replaced next month and not clear if she may have some obstruction  HYPOKALEMIA: Potassium is back to normal  ANEMIA: Likely to be from chronic disease and fairly stable Continue taking iron  DIABETES: Her A1c is 6.3 in October with fairly good blood sugars at home now She will continue her insulin regimen unchanged  Chronic and recurrent low back pain: She is taking hydrocodone when she is more active and she will get a prescription of 60 tablets to take with her today   Patient Instructions  SYNTHROID 1 1/2 PILLS ON SUNDAYS and 1 on other days     [name]  04/29/17    Note: This office note was prepared with Dragon voice recognition system technology. Any transcriptional errors that result from this process are unintentional.              Patient ID: [name] [name], female   DOB: Jul 22, 1940, 77 y.o.   MRN: [id number]   Reason for Appointment: Follow-up of various problems  History of Present Illness    RENAL dysfunction:  Renal function is variable,  appears to be again worse, previous creatinine generally stable about 1.5 Does not have diabetic nephropathy She  was also admitted to the hospital last month and it better with hydration  Her hydralazine has been stopped by another physician and she is on metoprolol from cardiologist She takes Lasix 20 mg every other day  She does not complain of feeling lightheaded on standing up  Also on her last visit she was told to cut back on high potassium foods although her diet was usually fairly good Potassium is back to normal consistently   Lab Results  Component Value Date   CREATININE 1.74 (H) 04/04/2017   CREATININE 1.75 (H) 03/15/2017   CREATININE 1.63 (H) 03/10/2017   CREATININE 2.41 (H) 02/22/2017   Lab Results  Component Value Date   CREATININE 1.74 (H) 04/04/2017   BUN 38 (H) 04/04/2017   NA 140 04/04/2017   K 5.1 04/19/2017   CL 111 04/04/2017   CO2 20 04/04/2017     Type 2 DIABETES mellitus, date of diagnosis: 1986.   The insulin regimen is:  Levemir 14 units hs, Novolog 6 ac twice a day   Type 2 diabetes  has been treated in the last few years with low dose basal bolus insulin regimen. She has not been taking  any oral hypoglycemic drugs including metformin partly because of GI side effects  A1c Is ranging from 5.9-6.3  Current management and problems:  She appears to have lost weight again, has had hospitalization about a month ago  However she thinks her appetite is fairly good recently  Blood sugars are fairly stable throughout the day with only a couple of readings over her 140 and no hypoglycemia  Taking insulin consistently as directed including NovoLog  She is not able to exercise because of back pain  Side effects from medications: Diarrhea from metformin and nausea and vomiting from GLP-1 drugs   Monitors blood glucose: Less than 1 times a day  Glucometer: One Touch.   Blood sugar readings   AVERAGE midday about 110, afternoon and evening average ranging from 132 up to 145 Overall median 123 Blood sugar range 95-1 57   Meals: she is usually eating low  fat meals especially recently meals usually at 11 AM,and supper at 5 pm.  Calorie intake: Usually controlled. Moderate Carbs Physical activity: exercise: Minimal  Dietician visit: Most recent:, 5/13.   Wt Readings from Last 3 Encounters:  04/29/17 138 lb 6.4 oz (62.8 kg)  04/13/17 140 lb 9.6 oz (63.8 kg)  03/18/17 135 lb (61.2 kg)    Lab Results  Component Value Date   HGBA1C 5.4 03/15/2017   HGBA1C 6.3 12/27/2016   HGBA1C 6.0 09/23/2016   Lab Results  Component Value Date   MICROALBUR 19.4 (H) 12/27/2016   LDLCALC 60 05/21/2016   CREATININE 1.74 (H) 04/04/2017    ANEMIA:  Taking iron supplements Hemoglobin has been mildly low, recently slightly better  Lab Results  Component Value Date   WBC 6.9 04/04/2017   HGB 12.2 04/04/2017   HCT 38.1 04/04/2017   MCV 93.6 04/04/2017   PLT 205.0 04/04/2017      HYPOTHYROIDISM: She has had long-standing primary hypothyroidism She is taking Levothyroxine, 88 g daily   She does feel fairly good overall and TSH is consistently normal   Lab Results  Component Value Date   TSH 4.57 (H) 04/04/2017   TSH 1.14 12/27/2016   TSH 0.64 09/23/2016   FREET4 1.20 12/27/2016   FREET4 1.44 09/23/2016   FREET4 1.31 07/26/2016    OTHER active problems: See review of systems    Allergies as of 04/29/2017   No Known Allergies     Medication List        Accurate as of 04/29/17  3:12 PM. Always use your most recent med list.          acetaminophen 650 MG CR tablet Commonly known as:  TYLENOL Take 1,300 mg by mouth every 8 (eight) hours as needed for pain.   albuterol 108 (90 Base) MCG/ACT inhaler Commonly known as:  PROVENTIL HFA;VENTOLIN HFA Inhale 2 puffs every 6 (six) hours as needed into the lungs for wheezing or shortness of breath.   aspirin 81 MG chewable tablet Chew 81 mg by mouth daily.   calcium-vitamin D 500-200 MG-UNIT tablet Commonly known as:  OSCAL WITH D Take 1 tablet by mouth every morning.   ferrous  sulfate 325 (65 FE) MG tablet Take 325 mg by mouth every evening.   glucose blood test strip Commonly known as:  ONE TOUCH ULTRA TEST  Use to test blood sugar 1-2 times daily Dx code- E11.9   HYDROcodone-acetaminophen 5-325 MG tablet Commonly known as:  NORCO/VICODIN Take 1 tablet by mouth 2 (two) times daily as needed for moderate pain or severe pain.   IMODIUM A-D PO Take by mouth as needed.   insulin aspart 100 UNIT/ML FlexPen Commonly known as:  NOVOLOG FLEXPEN Inject 3 Units into the skin 3 (three) times daily with meals.   Insulin Pen Needle 32G X 4 MM Misc Use 4 times daily   isosorbide mononitrate 30 MG 24 hr tablet Commonly known as:  IMDUR Take 1 tablet (30 mg total) daily by mouth.   latanoprost 0.005 % ophthalmic solution Commonly known as:  XALATAN Place 1 drop into both eyes at bedtime.   LEVEMIR 100 UNIT/ML injection Generic drug:  insulin detemir Inject 10 Units into the skin at bedtime.   levothyroxine 88 MCG tablet Commonly known as:  SYNTHROID, LEVOTHROID Take 1 tablet (88 mcg total) by mouth daily.   lidocaine 5 % Commonly known as:  LIDODERM Place 1 patch onto the skin daily. Remove & Discard patch within 12 hours or as directed by MD   magnesium gluconate 500 MG tablet Commonly known as:  MAGONATE Take 500 mg by mouth daily.   metoprolol succinate 25 MG 24 hr tablet Commonly known as:  TOPROL-XL TAKE 1 TABLET DAILY   omeprazole 20 MG capsule Commonly known as:  PRILOSEC TAKE 1 CAPSULE DAILY   polyethylene glycol packet Commonly known as:  MIRALAX / GLYCOLAX Take 17 g by mouth daily as needed for moderate constipation.   rosuvastatin 20 MG tablet Commonly known as:  CRESTOR TAKE 1 TABLET DAILY   vitamin B-12 1000 MCG tablet Commonly known as:  CYANOCOBALAMIN Take 1,000 mcg by mouth daily.   vitamin C 500 MG tablet Commonly known as:  ASCORBIC ACID Take 500 mg by mouth at bedtime.   Vitamin D (Ergocalciferol) 50000 units Caps  capsule Commonly known as:  DRISDOL TAKE 1 CAPSULE BY MOUTH EVERY 14 DAYS       Allergies: No Known Allergies  Past Medical History:  Diagnosis Date  . AAA (abdominal aortic aneurysm) ([place])   . Bilateral hydronephrosis   . Carotid artery stenosis    carotid doppler 06/2012 - Right CCA/Bulb/ICA with chronic occlusion; L vertebral artery with abnormal blood flow; L Bulb/Prox ICA  s/p endarterectomy with mild fibrous plaque, 50% diameter reduction  . CHF (congestive heart failure) ([place])   . Choledocholithiasis 2017  . CKD (chronic kidney disease), stage III ([place])   . Coronary artery disease due to lipid rich plaque cardiologist-  dr berry   s/p CABG x6 1997-- cath 12-09-2009 occluded vein to obtuse marginal branch and ramus branch with patent vien to PDA and patent LIMA to LAD, ef 40%-- Myoview 11-24-2011, nonischemic  . Diverticulosis   . Dyspnea on exertion   . GERD (gastroesophageal reflux disease)   . GI bleed   . History of sepsis    10-18-2014 w/ acute pyelonephritis  . Hyperlipidemia   . Hypertension   . Hypothyroidism   . Ischemic cardiomyopathy    03-25-2010-- per lasts echo EF  50-55%  . PAD (peripheral artery disease) ([place])    09/2010 LEAs - R ABI of 0.45, occluded fem-pop bypass graft, L ABI of 0.59 with occluded SFA; severe arterial insuff  . PVD (peripheral vascular disease) with claudication ([place])    last duplex 07-04-2015 -- Right CCA and ICA chronic occlusion, 34-19% LICA,  Patent vertebral arteries w/ antegrade flow, bilateral normal subclavian arteries   . Retroperitoneal fibrosis   . Tubular adenoma of colon   . Type 2 diabetes mellitus ([place])    monitored by dr Dwyane Dee    Past Surgical History:  Procedure Laterality Date  . AORTA - BILATERAL FEMORAL ARTERY BYPASS GRAFT  1997   and RIGHT FEM-POP   . CARDIAC CATHETERIZATION  12-09-2009  dr al little   EF >40%-- occluded vein to OM & ramus branches, patent vein to PDA, patent LIMA to LAD (Dr. [name], [facility]) -  later had thrombectomy of R fem-pop bypass ad R common femoral & profunda femoris artery (Dr. Oneida Alar)  . CARDIOVASCULAR STRESS TEST  11-24-2011   dr berry   Low Risk study: fixed basal to mid inferior attenuation artifact, no reversible ischemia,  normal LV function and wall motion , ef 67%  . CAROTID ENDARTERECTOMY Bilateral right 1994//  left ?  Marland Kitchen CATARACT EXTRACTION W/ INTRAOCULAR LENS  IMPLANT, BILATERAL  2006  . CHOLECYSTECTOMY N/A 09/05/2015   Procedure: ATTEMPTED LAPAROSCOPIC CHOLECYSTECTOMY, EXPLORATORY LAPAROTOMY WITH CHOLECYSTECTOMY;  Surgeon: Autumn Messing III, MD;  Location: [place];  Service: General;  Laterality: N/A;  . COLONOSCOPY    . COLONOSCOPY  10/21/2015  . COLONOSCOPY WITH PROPOFOL N/A 10/21/2015   Procedure: COLONOSCOPY WITH PROPOFOL;  Surgeon: Milus Banister, MD;  Location: [place];  Service: Endoscopy;  Laterality: N/A;  . CORONARY ARTERY BYPASS GRAFT  1997   x6; internal mammary to LAD, SVG to ramus #1 & #2, SVG to OM, SVG to PDA,   . CYSTOSCOPY W/ RETROGRADES Right 08/01/2015   Procedure: CYSTOSCOPY WITH RETROGRADE PYELOGRAM;  Surgeon: Ardis Hughs, MD;  Location: [facility];  Service: Urology;  Laterality: Right;  . CYSTOSCOPY W/ URETERAL STENT PLACEMENT  03/10/2012   Procedure: CYSTOSCOPY WITH RETROGRADE PYELOGRAM/URETERAL STENT PLACEMENT;  Surgeon: Hanley Ben, MD;  Location: [facility];  Service: Urology;  Laterality: Left;  . CYSTOSCOPY W/ URETERAL STENT PLACEMENT Bilateral 03/07/2015   Procedure: BILATERAL RETROGRADE PYELOGRAM AND RIGHT URETERAL STENT PLACEMENT;  Surgeon: Ardis Hughs, MD;  Location: [facility];  Service: Urology;  Laterality: Bilateral;  . CYSTOSCOPY W/ URETERAL STENT PLACEMENT Right 08/01/2015   Procedure: CYSTOSCOPY WITH STENT REPLACEMENT;  Surgeon: Ardis Hughs, MD;  Location: [facility];  Service: Urology;  Laterality: Right;  . CYSTOSCOPY W/ URETERAL STENT PLACEMENT Right 02/13/2016    Procedure: RIGHT URETERAL STENT EXCHANGE;  Surgeon: Ardis Hughs, MD;  [facility];  Service: Urology;  Laterality: Right;  . CYSTOSCOPY W/ URETERAL STENT PLACEMENT Right 09/17/2016   Procedure: CYSTOSCOPY WITH RIGHT  RETROGRADE PYELOGRAM RIGHT URETERAL STENT EXCHANGE;  Surgeon: Ardis Hughs, MD;  [facility];  Service: Urology;  Laterality: Right;  . CYSTOSCOPY W/ URETERAL STENT PLACEMENT Bilateral 03/18/2017   Procedure: CYSTOSCOPY WITH BILATERAL  RETROGRADE PYELOGRAM RIGHT URETERAL STENT [place];  Surgeon: Ardis Hughs, MD;  [facility];  Service: Urology;  Laterality: Bilateral;  . ERCP N/A 09/03/2015   Procedure: ENDOSCOPIC RETROGRADE CHOLANGIOPANCREATOGRAPHY (ERCP);  Surgeon: Irene Shipper, MD;  Location: [facility];  Service: Endoscopy;  Laterality: N/A;  . ESOPHAGOGASTRODUODENOSCOPY    . ESOPHAGOGASTRODUODENOSCOPY N/A 10/19/2015   Procedure: ESOPHAGOGASTRODUODENOSCOPY (EGD);  Surgeon: Gatha Mayer, MD;  Location: [facility];  Service: Endoscopy;  Laterality: N/A;  . GIVENS CAPSULE STUDY  10/21/2015  . GIVENS CAPSULE STUDY N/A 10/21/2015   Procedure: GIVENS CAPSULE STUDY;  Surgeon: Melene Plan  Ardis Hughs, MD;  Location: [place];  Service: Endoscopy;  Laterality: N/A;  . REPAIR RIGHT FEMORAL PSEUDOANEUYSM/  RIGHT FEM-POP BYPASS GRAFT/  DEBRIDEMENT RIGHT LOWER EXTREMITIY VENOUS STATUS ULCERS X2  01-12-2005  . TOTAL ABDOMINAL HYSTERECTOMY W/ BILATERAL SALPINGOOPHORECTOMY  1986  . TRANSTHORACIC ECHOCARDIOGRAM  03/25/2010   EF 62-83%, LV systolic function low normal with mild inferoseptal hypocontractility; LA mildly dilated; mod MR; mild TR, RV systolic pressure elevated, mild pulm HTN; AV mildly sclerotic; mild pulm valve regurg; aortic root sclerosis/calcif     Family History  Problem Relation Age of Onset  . Congestive Heart Failure Mother   . Diabetes Mother   . Stroke Father   . Cancer Maternal Aunt        Breast cancer    Social  History:  reports that she quit smoking about 7 years ago. She has a 60.00 pack-year smoking history. she has never used smokeless tobacco. She reports that she drinks about 1.8 oz of alcohol per week. She reports that she does not use drugs.  Review of Systems -     Hyperlipidemia: Has history of high LDL and triglycerides/low HDL  Was changed from Lipitor to Crestor because of higher LDL, tolerating this well    LDL is below 70 Triglycerides were higher in the last visit    Lab Results  Component Value Date   CHOL 131 12/27/2016   HDL 26.80 (L) 12/27/2016   LDLCALC 60 05/21/2016   LDLDIRECT 60.0 12/27/2016   TRIG 280.0 (H) 12/27/2016   CHOLHDL 5 12/27/2016     Vitamin D deficiency: She has been on supplements and taking her 50,000 units every other week  Her level is normal this year  Her orthopedic doctor is treating her for the low back pain which is chronic   MUSCLE cramps: Somewhat better with taking magnesium pill at bedtime  CHF history: She was admitted with shortness of breath and orthopnea on 01/23/16 This has been followed by cardiologist She has a low ejection fraction of 20-25 %  Currently on metoprolol  Not on Lasix and does not have shortness of breath Also she had atrial flutter which has resolved    Examination:   BP 130/70 (BP Location: Left Arm, Patient Position: Sitting, Cuff Size: Normal)   Pulse 62   Ht 5\' 6"  (1.676 m)   Wt 138 lb 6.4 oz (62.8 kg)   SpO2 95%   BMI 22.34 kg/m   Body mass index is 22.34 kg/m.    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 but tends to have high readings even without intake of high potassium  foods Will need to get nephrology consultation for this and CKD She does not want to do this but discussed that we need to have at least a baseline consultation done  ANEMIA: Better with supplementing with iron and hemoglobin last was normal  DIABETES: Her A1c is surprisingly low at 5.4 No hypoglycemia She will continue her insulin regimen unchanged, Levemir had been reduced  MUSCLE cramps: She has nighttime muscle cramps and she thinks they are better with magnesium and asking about taking 2 tablets since the gram recurs about 6 hours later and advised her  that she can do so  HYPOTHYROIDISM: Although she is asymptomatic her TSH is high at 4.6 and she is taking only 88 g May be requiring higher doses because of her weight gain She will go up to 100 g on the next prescription but since she has a 90 day supply she can take an extra half tablet weekly  Patient Instructions  SYNTHROID 1 1/2 PILLS ON SUNDAYS and 1 on other days   Total visit time for evaluation and management of multiple problems, counseling regarding diabetes, hyperkalemia, kidney disease, Review of records = 25 minutes  Zyan Coby  04/29/17    Note: This office note was prepared with Dragon voice recognition system technology. Any transcriptional errors that result from this process are unintentional.

## 2017-04-29 NOTE — Patient Instructions (Signed)
SYNTHROID 1 1/2 PILLS ON SUNDAYS and 1 on other days

## 2017-05-02 ENCOUNTER — Other Ambulatory Visit: Payer: Self-pay | Admitting: Endocrinology

## 2017-05-03 ENCOUNTER — Other Ambulatory Visit: Payer: Self-pay | Admitting: *Deleted

## 2017-05-06 ENCOUNTER — Other Ambulatory Visit: Payer: Self-pay | Admitting: Endocrinology

## 2017-05-08 ENCOUNTER — Other Ambulatory Visit: Payer: Self-pay | Admitting: Endocrinology

## 2017-05-11 ENCOUNTER — Other Ambulatory Visit: Payer: Self-pay | Admitting: Cardiovascular Disease

## 2017-05-12 ENCOUNTER — Telehealth: Payer: Self-pay | Admitting: Endocrinology

## 2017-05-12 NOTE — Telephone Encounter (Signed)
Saline nasal spray OTC and allergy medicine such as Claritin or Allegra

## 2017-05-12 NOTE — Telephone Encounter (Signed)
Please advise 

## 2017-05-12 NOTE — Telephone Encounter (Signed)
Pt informed

## 2017-05-12 NOTE — Telephone Encounter (Signed)
Patient would like some advise on what kind of medication she could use. She states she has runny nose, and when she takes a deep breath or blows her nose it is a burning feeling  Please advise

## 2017-05-17 ENCOUNTER — Other Ambulatory Visit: Payer: Self-pay | Admitting: Endocrinology

## 2017-05-17 ENCOUNTER — Telehealth: Payer: Self-pay | Admitting: Endocrinology

## 2017-05-17 MED ORDER — HYDROCODONE-ACETAMINOPHEN 5-325 MG PO TABS: 1.0000 | ORAL_TABLET | Freq: Two times a day (BID) | ORAL | 0 refills | Status: DC | PRN

## 2017-05-17 NOTE — Telephone Encounter (Signed)
Can you please send this?

## 2017-05-17 NOTE — Telephone Encounter (Signed)
Patient requests Dr Dwyane Dee write or send a new RX for Hydrocodone to [facility] on [address] Please call patient at ph# [phone] if RX is written-she will pick up when ready

## 2017-06-01 ENCOUNTER — Other Ambulatory Visit: Payer: Self-pay | Admitting: Endocrinology

## 2017-06-08 ENCOUNTER — Ambulatory Visit (INDEPENDENT_AMBULATORY_CARE_PROVIDER_SITE_OTHER): Payer: Medicare Other | Admitting: Podiatry

## 2017-06-08 ENCOUNTER — Encounter: Payer: Self-pay | Admitting: Podiatry

## 2017-06-08 DIAGNOSIS — M79675 Pain in left toe(s): Secondary | ICD-10-CM

## 2017-06-08 DIAGNOSIS — M79674 Pain in right toe(s): Secondary | ICD-10-CM

## 2017-06-08 DIAGNOSIS — B351 Tinea unguium: Secondary | ICD-10-CM | POA: Diagnosis not present

## 2017-06-08 DIAGNOSIS — E1151 Type 2 diabetes mellitus with diabetic peripheral angiopathy without gangrene: Secondary | ICD-10-CM

## 2017-06-08 NOTE — Progress Notes (Signed)
Complaint:  Visit Type: Patient returns to my office for continued preventative foot care services. Complaint: Patient states" my nails have grown long and thick and become painful to walk and wear shoes" Patient has been diagnosed with DM The patient presents for preventative foot care services. No changes to ROS.  She says the callus under her right big toe joint is  pain-free.  Podiatric Exam: Vascular: dorsalis pedis and posterior tibial pulses are diminished   bilateral. Capillary return is immediate. Temperature gradient is WNL. Skin turgor WNL  Sensorium: Diminished Semmes Weinstein monofilament test. Normal tactile sensation bilaterally. Nail Exam: Pt has thick disfigured discolored nails with subungual debris noted bilateral entire nail hallux through fifth toenails Ulcer Exam: There is no evidence of ulcer or pre-ulcerative changes or infection. Orthopedic Exam: Muscle tone and strength are WNL. No limitations in general ROM. No crepitus or effusions noted. Foot type and digits show no abnormalities. Bony prominences are unremarkable. Skin: No Porokeratosis. No infection or ulcers  Diagnosis:  Onychomycosis, , Pain in right toe, pain in left toes  Treatment & Plan Procedures and Treatment: Consent by patient was obtained for treatment procedures. The patient understood the discussion of treatment and procedures well. All questions were answered thoroughly reviewed. Debridement of mycotic and hypertrophic toenails, 1 through 5 bilateral and clearing of subungual debris. No ulceration, no infection noted. Asymptomatic callus  B/l Return Visit-Office Procedure: Patient instructed to return to the office for a follow up visit 3 months for continued evaluation and treatment.    Gardiner Barefoot DPM

## 2017-06-10 DIAGNOSIS — H40053 Ocular hypertension, bilateral: Secondary | ICD-10-CM | POA: Diagnosis not present

## 2017-06-10 DIAGNOSIS — E119 Type 2 diabetes mellitus without complications: Secondary | ICD-10-CM | POA: Diagnosis not present

## 2017-06-10 DIAGNOSIS — H35373 Puckering of macula, bilateral: Secondary | ICD-10-CM | POA: Diagnosis not present

## 2017-06-23 ENCOUNTER — Telehealth: Payer: Self-pay | Admitting: Endocrinology

## 2017-06-23 ENCOUNTER — Other Ambulatory Visit: Payer: Self-pay | Admitting: Endocrinology

## 2017-06-23 MED ORDER — HYDROCODONE-ACETAMINOPHEN 5-325 MG PO TABS: 1.0000 | ORAL_TABLET | Freq: Two times a day (BID) | ORAL | 0 refills | Status: DC | PRN

## 2017-06-23 NOTE — Telephone Encounter (Signed)
Prescription has been sent 60 tablets no refill

## 2017-06-23 NOTE — Telephone Encounter (Signed)
HYDROcodone-acetaminophen (NORCO/VICODIN) 5-325 MG tablet        Patient would like Dr Dwyane Dee to send in a prescription for her medication listed above.       [facility], [address] AT [address]

## 2017-06-23 NOTE — Telephone Encounter (Signed)
Please advise 

## 2017-07-04 ENCOUNTER — Ambulatory Visit (HOSPITAL_COMMUNITY)
Admission: RE | Admit: 2017-07-04 | Discharge: 2017-07-04 | Disposition: A | Discharge status: Home / Self Care | Payer: Medicare Other | Source: Ambulatory Visit | Attending: Cardiovascular Disease | Admitting: Cardiovascular Disease

## 2017-07-04 DIAGNOSIS — I6523 Occlusion and stenosis of bilateral carotid arteries: Secondary | ICD-10-CM | POA: Diagnosis not present

## 2017-07-07 ENCOUNTER — Other Ambulatory Visit: Payer: Self-pay | Admitting: Endocrinology

## 2017-07-07 ENCOUNTER — Other Ambulatory Visit: Payer: Self-pay

## 2017-07-07 DIAGNOSIS — I739 Peripheral vascular disease, unspecified: Secondary | ICD-10-CM

## 2017-07-25 ENCOUNTER — Other Ambulatory Visit: Payer: Medicare Other

## 2017-07-28 ENCOUNTER — Ambulatory Visit: Payer: Medicare Other | Admitting: Endocrinology

## 2017-08-08 DIAGNOSIS — N135 Crossing vessel and stricture of ureter without hydronephrosis: Secondary | ICD-10-CM | POA: Diagnosis not present

## 2017-08-10 ENCOUNTER — Other Ambulatory Visit (INDEPENDENT_AMBULATORY_CARE_PROVIDER_SITE_OTHER): Payer: Medicare Other

## 2017-08-10 DIAGNOSIS — D508 Other iron deficiency anemias: Secondary | ICD-10-CM | POA: Diagnosis not present

## 2017-08-10 DIAGNOSIS — E063 Autoimmune thyroiditis: Secondary | ICD-10-CM | POA: Diagnosis not present

## 2017-08-10 DIAGNOSIS — E119 Type 2 diabetes mellitus without complications: Secondary | ICD-10-CM

## 2017-08-10 LAB — COMPREHENSIVE METABOLIC PANEL
ALT: 11 U/L (ref 0–35)
AST: 15 U/L (ref 0–37)
Albumin: 3.6 g/dL (ref 3.5–5.2)
Alkaline Phosphatase: 58 U/L (ref 39–117)
BUN: 36 mg/dL — AB (ref 6–23)
CHLORIDE: 108 meq/L (ref 96–112)
CO2: 25 meq/L (ref 19–32)
Calcium: 9.1 mg/dL (ref 8.4–10.5)
Creatinine, Ser: 1.68 mg/dL — ABNORMAL HIGH (ref 0.40–1.20)
GFR: 31.46 mL/min — AB (ref >60.00)
GLUCOSE: 94 mg/dL (ref 70–99)
POTASSIUM: 5.2 meq/L — AB (ref 3.5–5.1)
SODIUM: 139 meq/L (ref 135–145)
Total Bilirubin: 0.3 mg/dL (ref 0.2–1.2)
Total Protein: 6.3 g/dL (ref 6.0–8.3)

## 2017-08-10 LAB — T4, FREE: FREE T4: 0.84 ng/dL (ref 0.60–1.60)

## 2017-08-10 LAB — CBC
HCT: 35.4 % — ABNORMAL LOW (ref 36.0–46.0)
Hemoglobin: 11.9 g/dL — ABNORMAL LOW (ref 12.0–15.0)
MCHC: 33.6 g/dL (ref 30.0–36.0)
MCV: 93.8 fl (ref 78.0–100.0)
PLATELETS: 183 10*3/uL (ref 150.0–400.0)
RBC: 3.77 Mil/uL — AB (ref 3.87–5.11)
RDW: 13.9 % (ref 11.5–15.5)
WBC: 7.6 10*3/uL (ref 4.0–10.5)

## 2017-08-10 LAB — HEMOGLOBIN A1C: Hgb A1c MFr Bld: 5.9 % (ref 4.6–6.5)

## 2017-08-10 LAB — TSH: TSH: 43.69 u[IU]/mL — AB (ref 0.35–4.50)

## 2017-08-16 ENCOUNTER — Ambulatory Visit: Payer: Medicare Other | Admitting: Endocrinology

## 2017-08-16 ENCOUNTER — Encounter: Payer: Self-pay | Admitting: Endocrinology

## 2017-08-16 ENCOUNTER — Ambulatory Visit (INDEPENDENT_AMBULATORY_CARE_PROVIDER_SITE_OTHER): Payer: Medicare Other | Admitting: Endocrinology

## 2017-08-16 VITALS — BP 144/68 | HR 78 | Wt 144.4 lb

## 2017-08-16 DIAGNOSIS — N183 Chronic kidney disease, stage 3 unspecified: Secondary | ICD-10-CM

## 2017-08-16 DIAGNOSIS — E875 Hyperkalemia: Secondary | ICD-10-CM

## 2017-08-16 DIAGNOSIS — I6523 Occlusion and stenosis of bilateral carotid arteries: Secondary | ICD-10-CM | POA: Diagnosis not present

## 2017-08-16 DIAGNOSIS — E063 Autoimmune thyroiditis: Secondary | ICD-10-CM | POA: Diagnosis not present

## 2017-08-16 DIAGNOSIS — D508 Other iron deficiency anemias: Secondary | ICD-10-CM | POA: Diagnosis not present

## 2017-08-16 DIAGNOSIS — E782 Mixed hyperlipidemia: Secondary | ICD-10-CM

## 2017-08-16 MED ORDER — LEVOTHYROXINE SODIUM 125 MCG PO TABS: 125.0000 ug | ORAL_TABLET | Freq: Every day | ORAL | 3 refills | Status: DC

## 2017-08-16 MED ORDER — HYDROCODONE-ACETAMINOPHEN 5-325 MG PO TABS: 1.0000 | ORAL_TABLET | Freq: Two times a day (BID) | ORAL | 0 refills | Status: DC | PRN

## 2017-08-16 NOTE — Patient Instructions (Addendum)
Leave off Bananas, oranges  Leave off magnesium

## 2017-08-16 NOTE — Progress Notes (Signed)
Patient ID: [name] [name], female   DOB: [birth year], 77 y.o.   MRN: [id number]   Reason for Appointment: Follow-up of various problems  History of Present Illness    RENAL dysfunction/HYPERKALEMIA:  Renal function is variable however recently appears to be more stable Does not have diabetic nephropathy Her only blood pressure treatment is metoprolol from cardiologist  She was told to cut back on high potassium foods and given a list of high potassium foods She is not sure about high potassium foods now and asking about information again Potassium is slightly above normal again   Lab Results  Component Value Date   CREATININE 1.68 (H) 08/10/2017   CREATININE 1.74 (H) 04/04/2017   CREATININE 1.75 (H) 03/15/2017   CREATININE 1.63 (H) 03/10/2017   Lab Results  Component Value Date   K 5.2 (H) 08/10/2017      Type 2 DIABETES mellitus, date of diagnosis: 1986.   The insulin regimen is: Levemir 10 units hs, Novolog 3 ac twice a day   Type 2 diabetes  has been treated in the last few years with low dose basal bolus insulin regimen. She has not been taking  any oral hypoglycemic drugs   A1c is again near normal at 5.9, previous range 5.4-6.3 and not after meals   Current management and problems:  She did bring her monitor  However she is checking blood sugars mostly in the mornings and not after meals  Blood sugars are quite stable just above 100 most of the time  She is taking only small doses of NovoLog and occasionally for small meal she will not take any  Her weight is relatively higher now  She is not able to exercise again and limited because of back pain  Side effects from medications: Diarrhea from metformin and nausea and vomiting from GLP-1 drugs   Monitors blood glucose: Less than 1 times a day  Glucometer: One Touch.   Blood sugar readings by DOWNLOAD: AVERAGE 122 with range 91-174 and recently morning sugars ranging from 101 up to  132 Nonfasting 119-142  Meals: she is usually eating low fat meals especially recently meals usually at 11 AM,and supper at 5 pm.  Calorie intake: Usually controlled. Moderate Carbs Physical activity: exercise: Minimal  Dietician visit: Most recent:, 5/13.   Wt Readings from Last 3 Encounters:  08/16/17 144 lb 6.4 oz (65.5 kg)  04/29/17 138 lb 6.4 oz (62.8 kg)  04/13/17 140 lb 9.6 oz (63.8 kg)    Lab Results  Component Value Date   HGBA1C 5.9 08/10/2017   HGBA1C 5.4 03/15/2017   HGBA1C 6.3 12/27/2016   Lab Results  Component Value Date   MICROALBUR 19.4 (H) 12/27/2016   LDLCALC 60 05/21/2016   CREATININE 1.68 (H) 08/10/2017    ANEMIA:  Taking iron supplement, previously iron saturation was low  Hemoglobin has been mildly low recently or low normal   Lab Results  Component Value Date   WBC 7.6 08/10/2017   HGB 11.9 (L) 08/10/2017   HCT 35.4 (L) 08/10/2017   MCV 93.8 08/10/2017   PLT 183.0 08/10/2017      HYPOTHYROIDISM: She has had long-standing primary hypothyroidism She is taking Levothyroxine, currently 88 g daily, 6 and half tablets weekly She was supposed to increase the dose up to 100 mcg but she has got the old prescription filled  She takes this consistently in the morning and is taking the iron in the evening  She complains of feeling  more sleepy, no significant cold intolerance or edema  However TSH is much higher than usual Her weight has also gone up   Lab Results  Component Value Date   TSH 43.69 (H) 08/10/2017   TSH 4.57 (H) 04/04/2017   TSH 1.14 12/27/2016   FREET4 0.84 08/10/2017   FREET4 1.20 12/27/2016   FREET4 1.44 09/23/2016    OTHER active problems: See review of systems    Allergies as of 08/16/2017   No Known Allergies     Medication List        Accurate as of 08/16/17  4:17 PM. Always use your most recent med list.          acetaminophen 650 MG CR tablet Commonly known as:  TYLENOL Take 1,300 mg by mouth every 8  (eight) hours as needed for pain.   albuterol 108 (90 Base) MCG/ACT inhaler Commonly known as:  PROVENTIL HFA;VENTOLIN HFA Inhale 2 puffs every 6 (six) hours as needed into the lungs for wheezing or shortness of breath.   aspirin 81 MG chewable tablet Chew 81 mg by mouth daily.   calcium-vitamin D 500-200 MG-UNIT tablet Commonly known as:  OSCAL WITH D Take 1 tablet by mouth every morning.   ferrous sulfate 325 (65 FE) MG tablet Take 325 mg by mouth every evening.   glucose blood test strip Commonly known as:  ONE TOUCH ULTRA TEST Use to test blood sugar 1-2 times daily Dx code- E11.9   HYDROcodone-acetaminophen 5-325 MG tablet Commonly known as:  NORCO/VICODIN Take 1 tablet by mouth 2 (two) times daily as needed for moderate pain or severe pain.   IMODIUM A-D PO Take by mouth as needed.   Insulin Pen Needle 32G X 4 MM Misc Use 4 times daily   isosorbide mononitrate 30 MG 24 hr tablet Commonly known as:  IMDUR TAKE 1 TABLET(30 MG) BY MOUTH DAILY   latanoprost 0.005 % ophthalmic solution Commonly known as:  XALATAN Place 1 drop into both eyes at bedtime.   LEVEMIR 100 UNIT/ML injection Generic drug:  insulin detemir Inject 10 Units into the skin at bedtime.   lidocaine 5 % Commonly known as:  LIDODERM Place 1 patch onto the skin daily. Remove & Discard patch within 12 hours or as directed by MD   magnesium gluconate 500 MG tablet Commonly known as:  MAGONATE Take 500 mg by mouth 2 (two) times daily.   metoprolol succinate 25 MG 24 hr tablet Commonly known as:  TOPROL-XL TAKE 1 TABLET DAILY   NOVOLOG FLEXPEN 100 UNIT/ML FlexPen Generic drug:  insulin aspart INJECT 8 UNITS 3 TIMES A   DAY WITH MEALS   omeprazole 20 MG capsule Commonly known as:  PRILOSEC TAKE 1 CAPSULE DAILY   polyethylene glycol packet Commonly known as:  MIRALAX / GLYCOLAX Take 17 g by mouth daily as needed for moderate constipation.   rosuvastatin 20 MG tablet Commonly known as:   CRESTOR TAKE 1 TABLET DAILY   vitamin B-12 1000 MCG tablet Commonly known as:  CYANOCOBALAMIN Take 1,000 mcg by mouth daily.   vitamin C 500 MG tablet Commonly known as:  ASCORBIC ACID Take 500 mg by mouth at bedtime.   Vitamin D (Ergocalciferol) 50000 units Caps capsule Commonly known as:  DRISDOL TAKE 1 CAPSULE BY MOUTH EVERY 7 DAYS( ON THURSDAY)       Allergies: No Known Allergies  Past Medical History:  Diagnosis Date  . AAA (abdominal aortic aneurysm) ([place])   . Bilateral hydronephrosis   .  Carotid artery stenosis    carotid doppler 06/2012 - Right CCA/Bulb/ICA with chronic occlusion; L vertebral artery with abnormal blood flow; L Bulb/Prox ICA  s/p endarterectomy with mild fibrous plaque, 50% diameter reduction  . CHF (congestive heart failure) ([place])   . Choledocholithiasis 2017  . CKD (chronic kidney disease), stage III ([place])   . Coronary artery disease due to lipid rich plaque cardiologist-  dr berry   s/p CABG x6 1997-- cath 12-09-2009 occluded vein to obtuse marginal branch and ramus branch with patent vien to PDA and patent LIMA to LAD, ef 40%-- Myoview 11-24-2011, nonischemic  . Diverticulosis   . Dyspnea on exertion   . GERD (gastroesophageal reflux disease)   . GI bleed   . History of sepsis    10-18-2014 w/ acute pyelonephritis  . Hyperlipidemia   . Hypertension   . Hypothyroidism   . Ischemic cardiomyopathy    03-25-2010-- per lasts echo EF  50-55%  . PAD (peripheral artery disease) ([place])    09/2010 LEAs - R ABI of 0.45, occluded fem-pop bypass graft, L ABI of 0.59 with occluded SFA; severe arterial insuff  . PVD (peripheral vascular disease) with claudication ([place])    last duplex 07-04-2015 -- Right CCA and ICA chronic occlusion, 35-36% LICA, Patent vertebral arteries w/ antegrade flow, bilateral normal subclavian arteries   . Retroperitoneal fibrosis   . Tubular adenoma of colon   . Type 2 diabetes mellitus ([place])    monitored by dr Dwyane Dee    Past  Surgical History:  Procedure Laterality Date  . AORTA - BILATERAL FEMORAL ARTERY BYPASS GRAFT  1997   and RIGHT FEM-POP   . CARDIAC CATHETERIZATION  12-09-2009  dr al little   EF >40%-- occluded vein to OM & ramus branches, patent vein to PDA, patent LIMA to LAD (Dr. [name], [facility]) - later had thrombectomy of R fem-pop bypass ad R common femoral & profunda femoris artery (Dr. Oneida Alar)  . CARDIOVASCULAR STRESS TEST  11-24-2011   dr berry   Low Risk study: fixed basal to mid inferior attenuation artifact, no reversible ischemia,  normal LV function and wall motion , ef 67%  . CAROTID ENDARTERECTOMY Bilateral right 1994//  left ?  Marland Kitchen CATARACT EXTRACTION W/ INTRAOCULAR LENS  IMPLANT, BILATERAL  2006  . CHOLECYSTECTOMY N/A 09/05/2015   Procedure: ATTEMPTED LAPAROSCOPIC CHOLECYSTECTOMY, EXPLORATORY LAPAROTOMY WITH CHOLECYSTECTOMY;  Surgeon: Autumn Messing III, MD;  Location: [place];  Service: General;  Laterality: N/A;  . COLONOSCOPY    . COLONOSCOPY  10/21/2015  . COLONOSCOPY WITH PROPOFOL N/A 10/21/2015   Procedure: COLONOSCOPY WITH PROPOFOL;  Surgeon: Milus Banister, MD;  Location: [place];  Service: Endoscopy;  Laterality: N/A;  . CORONARY ARTERY BYPASS GRAFT  1997   x6; internal mammary to LAD, SVG to ramus #1 & #2, SVG to OM, SVG to PDA,   . CYSTOSCOPY W/ RETROGRADES Right 08/01/2015   Procedure: CYSTOSCOPY WITH RETROGRADE PYELOGRAM;  Surgeon: Ardis Hughs, MD;  Location: [facility];  Service: Urology;  Laterality: Right;  . CYSTOSCOPY W/ URETERAL STENT PLACEMENT  03/10/2012   Procedure: CYSTOSCOPY WITH RETROGRADE PYELOGRAM/URETERAL STENT PLACEMENT;  Surgeon: Hanley Ben, MD;  [facility];  Service: Urology;  Laterality: Left;  . CYSTOSCOPY W/ URETERAL STENT PLACEMENT Bilateral 03/07/2015   Procedure: BILATERAL RETROGRADE PYELOGRAM AND RIGHT URETERAL STENT PLACEMENT;  Surgeon: Ardis Hughs, MD;  Location: [facility];  Service: Urology;  Laterality:  Bilateral;  . [place]  Right 08/01/2015   Procedure: CYSTOSCOPY WITH STENT REPLACEMENT;  Surgeon: Ardis Hughs, MD;  Location: [facility];  Service: Urology;  Laterality: Right;  . CYSTOSCOPY W/ URETERAL STENT PLACEMENT Right 02/13/2016   Procedure: RIGHT URETERAL STENT EXCHANGE;  Surgeon: Ardis Hughs, MD;  [facility];  Service: Urology;  Laterality: Right;  . CYSTOSCOPY W/ URETERAL STENT PLACEMENT Right 09/17/2016   Procedure: CYSTOSCOPY WITH RIGHT  RETROGRADE PYELOGRAM RIGHT URETERAL STENT EXCHANGE;  Surgeon: Ardis Hughs, MD;  [facility];  Service: Urology;  Laterality: Right;  . CYSTOSCOPY W/ URETERAL STENT PLACEMENT Bilateral 03/18/2017   Procedure: CYSTOSCOPY WITH BILATERAL  RETROGRADE PYELOGRAM RIGHT URETERAL STENT [place];  Surgeon: Ardis Hughs, MD;  Location[facility];  Service: Urology;  Laterality: Bilateral;  . ERCP N/A 09/03/2015   Procedure: ENDOSCOPIC RETROGRADE CHOLANGIOPANCREATOGRAPHY (ERCP);  Surgeon: Irene Shipper, MD;  Location: [facility];  Service: Endoscopy;  Laterality: N/A;  . ESOPHAGOGASTRODUODENOSCOPY    . ESOPHAGOGASTRODUODENOSCOPY N/A 10/19/2015   Procedure: ESOPHAGOGASTRODUODENOSCOPY (EGD);  Surgeon: Gatha Mayer, MD;  Location: [facility];  Service: Endoscopy;  Laterality: N/A;  . GIVENS CAPSULE STUDY  10/21/2015  . GIVENS CAPSULE STUDY N/A 10/21/2015   Procedure: GIVENS CAPSULE STUDY;  Surgeon: Milus Banister, MD;  Location: [place];  Service: Endoscopy;  Laterality: N/A;  . REPAIR RIGHT FEMORAL PSEUDOANEUYSM/  RIGHT FEM-POP BYPASS GRAFT/  DEBRIDEMENT RIGHT LOWER EXTREMITIY VENOUS STATUS ULCERS X2  01-12-2005  . TOTAL ABDOMINAL HYSTERECTOMY W/ BILATERAL SALPINGOOPHORECTOMY  1986  . TRANSTHORACIC ECHOCARDIOGRAM  03/25/2010   EF 00-92%, LV systolic function low normal with mild inferoseptal hypocontractility; LA mildly dilated; mod MR; mild TR, RV systolic pressure  elevated, mild pulm HTN; AV mildly sclerotic; mild pulm valve regurg; aortic root sclerosis/calcif     Family History  Problem Relation Age of Onset  . Congestive Heart Failure Mother   . Diabetes Mother   . Stroke Father   . Cancer Maternal Aunt        Breast cancer    Social History:  reports that she quit smoking about 8 years ago. She has a 60.00 pack-year smoking history. She has never used smokeless tobacco. She reports that she drinks about 1.8 oz of alcohol per week. She reports that she does not use drugs.  Review of Systems -     Hyperlipidemia: Has history of high LDL and triglycerides/low HDL  Was changed from Lipitor to Crestor because of higher LDL, tolerating this well    LDL is usually in the normal range  Tends to have high triglycerides    Lab Results  Component Value Date   CHOL 131 12/27/2016   HDL 26.80 (L) 12/27/2016   LDLCALC 60 05/21/2016   LDLDIRECT 60.0 12/27/2016   TRIG 280.0 (H) 12/27/2016   CHOLHDL 5 12/27/2016     Vitamin D deficiency: She has been on supplements and taking her 50,000 units every other week  Her level is normal more recently     CHF: She was admitted in 01/23/16 This has been followed by cardiologist She has a low ejection fraction of 20-25 %  Currently on metoprolol and Lasix as needed Also she had atrial flutter in 2018  No recent problems with shortness of breath   Examination:   BP (!) 144/68   Pulse 78   Wt 144 lb 6.4 oz (65.5 kg)   BMI 23.31 kg/m   Body mass index is 23.31 kg/m.    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 and fairly stable near the lower limit of normal  She will continue taking iron, to have iron saturation checked on the next visit  DIABETES on low-dose insulin: Her A1c is indicating excellent control with a level of 5.9 She is gaining weight but this may be more from her hypothyroidism Most likely she can keep her insulin including mealtime dose the same even though she does need to have a better idea of what her sugars are after meals in the evening  HYPOTHYROIDISM: Her TSH is over 40 and she is having some lethargy and sleepiness However unclear why she is having a change in her thyroid levels, she is compliant with her medication as directed and not taking any interacting substances with her supplement in the morning  Since she was previously taking 125 mcg she will go back to this dose but needs follow-up in 2 months  Chronic low back pain: She is taking hydrocodone as needed and will continue to give her this prescription for 60 tablets  History of CHF: No significant symptoms now and she will continue follow-up with cardiologist, also her heart rate appears to be controlled  DIARRHEA: This is intermittent and not clear of etiology, may be related to either magnesium supplements, IBS or postcholecystectomy She can try holding off on her magnesium for now  MUSCLE cramps: She can take tonic water at bedtime  Total visit time for evaluation and management of multiple problems and counseling =25 minutes  Patient Instructions  Leave off Bananas, oranges  Leave off magnesium     [name]  08/16/17    Note: This office note was prepared with Dragon voice recognition system technology. Any transcriptional errors that result from this process are unintentional.

## 2017-09-07 ENCOUNTER — Ambulatory Visit (INDEPENDENT_AMBULATORY_CARE_PROVIDER_SITE_OTHER): Payer: Medicare Other | Admitting: Podiatry

## 2017-09-07 ENCOUNTER — Encounter: Payer: Self-pay | Admitting: Podiatry

## 2017-09-07 DIAGNOSIS — B351 Tinea unguium: Secondary | ICD-10-CM | POA: Diagnosis not present

## 2017-09-07 DIAGNOSIS — M79675 Pain in left toe(s): Secondary | ICD-10-CM

## 2017-09-07 DIAGNOSIS — M79674 Pain in right toe(s): Secondary | ICD-10-CM

## 2017-09-07 DIAGNOSIS — E1151 Type 2 diabetes mellitus with diabetic peripheral angiopathy without gangrene: Secondary | ICD-10-CM

## 2017-09-07 NOTE — Progress Notes (Signed)
Complaint:  Visit Type: Patient returns to my office for continued preventative foot care services. Complaint: Patient states" my nails have grown long and thick and become painful to walk and wear shoes" Patient has been diagnosed with DM The patient presents for preventative foot care services. No changes to ROS.  She says the callus under her right big toe joint is  pain-free.  Podiatric Exam: Vascular: dorsalis pedis and posterior tibial pulses are diminished   bilateral. Capillary return is immediate. Temperature gradient is WNL. Skin turgor WNL  Sensorium: Diminished Semmes Weinstein monofilament test. Normal tactile sensation bilaterally. Nail Exam: Pt has thick disfigured discolored nails with subungual debris noted bilateral entire nail hallux through fifth toenails Ulcer Exam: There is no evidence of ulcer or pre-ulcerative changes or infection. Orthopedic Exam: Muscle tone and strength are WNL. No limitations in general ROM. No crepitus or effusions noted. Foot type and digits show no abnormalities. Bony prominences are unremarkable. Skin: No Porokeratosis. No infection or ulcers  Diagnosis:  Onychomycosis, , Pain in right toe, pain in left toes  Treatment & Plan Procedures and Treatment: Consent by patient was obtained for treatment procedures. The patient understood the discussion of treatment and procedures well. All questions were answered thoroughly reviewed. Debridement of mycotic and hypertrophic toenails, 1 through 5 bilateral and clearing of subungual debris. No ulceration, no infection noted.   Return Visit-Office Procedure: Patient instructed to return to the office for a follow up visit 3 months for continued evaluation and treatment.    Gardiner Barefoot DPM

## 2017-09-12 ENCOUNTER — Telehealth: Payer: Self-pay | Admitting: Emergency Medicine

## 2017-09-12 NOTE — Telephone Encounter (Signed)
[name] [name] from [employer] called stating she tried to scheduled new patient appt for patient. Patient refused until she sees Dr Dwyane Dee again. Thanks.

## 2017-09-13 ENCOUNTER — Other Ambulatory Visit: Payer: Self-pay | Admitting: Endocrinology

## 2017-09-16 DIAGNOSIS — H40053 Ocular hypertension, bilateral: Secondary | ICD-10-CM | POA: Diagnosis not present

## 2017-09-16 DIAGNOSIS — H35373 Puckering of macula, bilateral: Secondary | ICD-10-CM | POA: Diagnosis not present

## 2017-09-16 DIAGNOSIS — E119 Type 2 diabetes mellitus without complications: Secondary | ICD-10-CM | POA: Diagnosis not present

## 2017-09-19 ENCOUNTER — Other Ambulatory Visit: Payer: Self-pay | Admitting: Urology

## 2017-09-20 ENCOUNTER — Telehealth: Payer: Self-pay | Admitting: Endocrinology

## 2017-09-20 NOTE — Telephone Encounter (Signed)
Pt is in need of refill for hydrocodone to be called into [facility] [phone]

## 2017-09-20 NOTE — Telephone Encounter (Signed)
Please refill if appropriate

## 2017-09-21 ENCOUNTER — Other Ambulatory Visit: Payer: Self-pay | Admitting: Endocrinology

## 2017-09-21 MED ORDER — HYDROCODONE-ACETAMINOPHEN 5-325 MG PO TABS: 1.0000 | ORAL_TABLET | Freq: Two times a day (BID) | ORAL | 0 refills | Status: DC | PRN

## 2017-09-29 NOTE — Patient Instructions (Addendum)
[name] [name]  09/29/2017   Your procedure is scheduled on: Thursday 10/06/2017  Report to [facility] Main  Entrance              Report to admitting at   [place] AM    Call this number if you have problems the morning of surgery [phone]    Remember: Do not eat food or drink liquids :After Midnight.   How to Manage Your Diabetes Before and After Surgery  Why is it important to control my blood sugar before and after surgery? . Improving blood sugar levels before and after surgery helps healing and can limit problems. . A way of improving blood sugar control is eating a healthy diet by: o  Eating less sugar and carbohydrates o  Increasing activity/exercise o  Talking with your doctor about reaching your blood sugar goals . High blood sugars (greater than 180 mg/dL) can raise your risk of infections and slow your recovery, so you will need to focus on controlling your diabetes during the weeks before surgery. . Make sure that the doctor who takes care of your diabetes knows about your planned surgery including the date and location.  How do I manage my blood sugar before surgery? . Check your blood sugar at least 4 times a day, starting 2 days before surgery, to make sure that the level is not too high or low. o Check your blood sugar the morning of your surgery when you wake up and every 2 hours until you get to the Short Stay unit. . If your blood sugar is less than 70 mg/dL, you will need to treat for low blood sugar: o Do not take insulin. o Treat a low blood sugar (less than 70 mg/dL) with  cup of clear juice (cranberry or apple), 4 glucose tablets, OR glucose gel. o Recheck blood sugar in 15 minutes after treatment (to make sure it is greater than 70 mg/dL). If your blood sugar is not greater than 70 mg/dL on recheck, call [phone] for further instructions. . Report your blood sugar to the short stay nurse when you get to Short Stay.  . If you  are admitted to the hospital after surgery: o Your blood sugar will be checked by the staff and you will probably be given insulin after surgery (instead of oral diabetes medicines) to make sure you have good blood sugar levels. o The goal for blood sugar control after surgery is 80-180 mg/dL.   WHAT DO I DO ABOUT MY DIABETES MEDICATION?  Marland Kitchen Do not take oral diabetes medicines (pills) the morning of surgery.  . THE NIGHT BEFORE SURGERY, take 5    units of  Levemir      insulin.       .    . If your CBG is greater than 220 mg/dL, you may take  of your sliding scale  . (correction) dose of insulin.       Take these medicines the morning of surgery with A SIP OF WATER: Isosorbide mononitrate (Imdur), Levothyroxine (Synthroid), Metoprolol succinate (Toprol-XL), Rosuvastatin (Crestor), use Albuterol inhaler if needed and bring inhaler with you to the hospital, Hydrocodone if needed   DO NOT [employer]  You may not have any metal on your body including hair pins and              piercings  Do not wear jewelry, make-up, lotions, powders or perfumes, deodorant             Do not wear nail polish.  Do not shave  48 hours prior to surgery.              Men may shave face and neck.   Do not bring valuables to the hospital. [place].  Contacts, dentures or bridgework may not be worn into surgery.  Leave suitcase in the car. After surgery it may be brought to your room.     Patients discharged the day of surgery will not be allowed to drive home.  Name and phone number of your driver:kelly Heatherly daughter cell [phone]  Special Instructions: N/A              Please read over the following fact sheets you were given: _____________________________________________________________________             [facility] - Preparing for Surgery Before surgery, you can play an  important role.  Because skin is not sterile, your skin needs to be as free of germs as possible.  You can reduce the number of germs on your skin by washing with CHG (chlorahexidine gluconate) soap before surgery.  CHG is an antiseptic cleaner which kills germs and bonds with the skin to continue killing germs even after washing. Please DO NOT use if you have an allergy to CHG or antibacterial soaps.  If your skin becomes reddened/irritated stop using the CHG and inform your nurse when you arrive at Short Stay. Do not shave (including legs and underarms) for at least 48 hours prior to the first CHG shower.  You may shave your face/neck. Please follow these instructions carefully:  1.  Shower with CHG Soap the night before surgery and the  morning of Surgery.  2.  If you choose to wash your hair, wash your hair first as usual with your  normal  shampoo.  3.  After you shampoo, rinse your hair and body thoroughly to remove the  shampoo.                           4.  Use CHG as you would any other liquid soap.  You can apply chg directly  to the skin and wash                       Gently with a scrungie or clean washcloth.  5.  Apply the CHG Soap to your body ONLY FROM THE NECK DOWN.   Do not use on face/ open                           Wound or open sores. Avoid contact with eyes, ears mouth and genitals (private parts).                       Wash face,  Genitals (private parts) with your normal soap.             6.  Wash thoroughly, paying special attention to the area where your surgery  will be performed.  7.  Thoroughly rinse your body with warm water from the neck down.  8.  DO NOT shower/wash with your normal soap after using and rinsing off  the CHG Soap.                9.  Pat yourself dry with a clean towel.            10.  Wear clean pajamas.            11.  Place clean sheets on your bed the night of your first shower and do not  sleep with pets. Day of Surgery : Do not apply any  lotions/deodorants the morning of surgery.  Please wear clean clothes to the hospital/surgery center.  FAILURE TO FOLLOW THESE INSTRUCTIONS MAY RESULT IN THE CANCELLATION OF YOUR SURGERY PATIENT SIGNATURE_________________________________  NURSE SIGNATURE__________________________________  ________________________________________________________________________

## 2017-09-29 NOTE — Progress Notes (Signed)
08/10/2017- noted in [facility]  03/15/2017- noted in Epic- EKG  04/15/2016- noted in Epic-ECHO

## 2017-10-03 ENCOUNTER — Encounter (HOSPITAL_COMMUNITY): Payer: Self-pay

## 2017-10-03 ENCOUNTER — Other Ambulatory Visit: Payer: Self-pay

## 2017-10-03 ENCOUNTER — Encounter (HOSPITAL_COMMUNITY)
Admission: RE | Admit: 2017-10-03 | Discharge: 2017-10-03 | Disposition: A | Discharge status: Home / Self Care | Payer: Medicare Other | Source: Ambulatory Visit | Attending: Urology | Admitting: Urology

## 2017-10-03 DIAGNOSIS — N135 Crossing vessel and stricture of ureter without hydronephrosis: Secondary | ICD-10-CM | POA: Diagnosis present

## 2017-10-03 DIAGNOSIS — M1990 Unspecified osteoarthritis, unspecified site: Secondary | ICD-10-CM | POA: Diagnosis not present

## 2017-10-03 DIAGNOSIS — Z87891 Personal history of nicotine dependence: Secondary | ICD-10-CM | POA: Diagnosis not present

## 2017-10-03 DIAGNOSIS — N131 Hydronephrosis with ureteral stricture, not elsewhere classified: Secondary | ICD-10-CM | POA: Diagnosis not present

## 2017-10-03 DIAGNOSIS — Z466 Encounter for fitting and adjustment of urinary device: Secondary | ICD-10-CM | POA: Diagnosis not present

## 2017-10-03 DIAGNOSIS — I714 Abdominal aortic aneurysm, without rupture: Secondary | ICD-10-CM | POA: Diagnosis not present

## 2017-10-03 DIAGNOSIS — Z794 Long term (current) use of insulin: Secondary | ICD-10-CM | POA: Diagnosis not present

## 2017-10-03 DIAGNOSIS — E1151 Type 2 diabetes mellitus with diabetic peripheral angiopathy without gangrene: Secondary | ICD-10-CM | POA: Diagnosis not present

## 2017-10-03 DIAGNOSIS — I2510 Atherosclerotic heart disease of native coronary artery without angina pectoris: Secondary | ICD-10-CM | POA: Diagnosis not present

## 2017-10-03 DIAGNOSIS — I110 Hypertensive heart disease with heart failure: Secondary | ICD-10-CM | POA: Diagnosis not present

## 2017-10-03 DIAGNOSIS — K219 Gastro-esophageal reflux disease without esophagitis: Secondary | ICD-10-CM | POA: Diagnosis not present

## 2017-10-03 DIAGNOSIS — I509 Heart failure, unspecified: Secondary | ICD-10-CM | POA: Diagnosis not present

## 2017-10-03 DIAGNOSIS — E039 Hypothyroidism, unspecified: Secondary | ICD-10-CM | POA: Diagnosis not present

## 2017-10-03 DIAGNOSIS — E7800 Pure hypercholesterolemia, unspecified: Secondary | ICD-10-CM | POA: Diagnosis not present

## 2017-10-03 DIAGNOSIS — Z79899 Other long term (current) drug therapy: Secondary | ICD-10-CM | POA: Diagnosis not present

## 2017-10-03 DIAGNOSIS — Z951 Presence of aortocoronary bypass graft: Secondary | ICD-10-CM | POA: Diagnosis not present

## 2017-10-03 DIAGNOSIS — Z7982 Long term (current) use of aspirin: Secondary | ICD-10-CM | POA: Diagnosis not present

## 2017-10-03 DIAGNOSIS — I252 Old myocardial infarction: Secondary | ICD-10-CM | POA: Diagnosis not present

## 2017-10-03 DIAGNOSIS — N261 Atrophy of kidney (terminal): Secondary | ICD-10-CM | POA: Diagnosis not present

## 2017-10-03 LAB — BASIC METABOLIC PANEL
ANION GAP: 9 (ref 5–15)
BUN: 37 mg/dL — ABNORMAL HIGH (ref 8–23)
CALCIUM: 8.8 mg/dL — AB (ref 8.9–10.3)
CHLORIDE: 116 mmol/L — AB (ref 98–111)
CO2: 19 mmol/L — ABNORMAL LOW (ref 22–32)
CREATININE: 1.78 mg/dL — AB (ref 0.44–1.00)
GFR calc non Af Amer: 27 mL/min — ABNORMAL LOW (ref >60)
GFR, EST AFRICAN AMERICAN: 31 mL/min — AB (ref >60)
Glucose, Bld: 201 mg/dL — ABNORMAL HIGH (ref 70–99)
Potassium: 5 mmol/L (ref 3.5–5.1)
SODIUM: 144 mmol/L (ref 135–145)

## 2017-10-03 LAB — CBC
HCT: 37.9 % (ref 36.0–46.0)
HEMOGLOBIN: 12.1 g/dL (ref 12.0–15.0)
MCH: 30.4 pg (ref 26.0–34.0)
MCHC: 31.9 g/dL (ref 30.0–36.0)
MCV: 95.2 fL (ref 78.0–100.0)
Platelets: 246 10*3/uL (ref 150–400)
RBC: 3.98 MIL/uL (ref 3.87–5.11)
RDW: 13.9 % (ref 11.5–15.5)
WBC: 9.8 10*3/uL (ref 4.0–10.5)

## 2017-10-03 LAB — GLUCOSE, CAPILLARY: Glucose-Capillary: 159 mg/dL — ABNORMAL HIGH (ref 70–99)

## 2017-10-04 ENCOUNTER — Other Ambulatory Visit (INDEPENDENT_AMBULATORY_CARE_PROVIDER_SITE_OTHER): Payer: Medicare Other

## 2017-10-04 DIAGNOSIS — D508 Other iron deficiency anemias: Secondary | ICD-10-CM | POA: Diagnosis not present

## 2017-10-04 DIAGNOSIS — E782 Mixed hyperlipidemia: Secondary | ICD-10-CM | POA: Diagnosis not present

## 2017-10-04 DIAGNOSIS — E063 Autoimmune thyroiditis: Secondary | ICD-10-CM

## 2017-10-04 DIAGNOSIS — E875 Hyperkalemia: Secondary | ICD-10-CM

## 2017-10-04 LAB — LIPID PANEL
Cholesterol: 121 mg/dL (ref 0–200)
HDL: 29.7 mg/dL — AB (ref >39.00)
NonHDL: 91.3
Total CHOL/HDL Ratio: 4
Triglycerides: 221 mg/dL — ABNORMAL HIGH (ref 0.0–149.0)
VLDL: 44.2 mg/dL — AB (ref 0.0–40.0)

## 2017-10-04 LAB — IBC PANEL
Iron: 46 ug/dL (ref 42–145)
Saturation Ratios: 20.9 % (ref 20.0–50.0)
TRANSFERRIN: 157 mg/dL — AB (ref 212.0–360.0)

## 2017-10-04 LAB — LDL CHOLESTEROL, DIRECT: Direct LDL: 59 mg/dL

## 2017-10-04 LAB — TSH: TSH: 1.03 u[IU]/mL (ref 0.35–4.50)

## 2017-10-04 LAB — T4, FREE: Free T4: 1.44 ng/dL (ref 0.60–1.60)

## 2017-10-04 NOTE — Progress Notes (Signed)
bmet results sent to dr Louis Meckel by epic fax

## 2017-10-06 ENCOUNTER — Ambulatory Visit (HOSPITAL_COMMUNITY): Payer: Medicare Other | Admitting: Anesthesiology

## 2017-10-06 ENCOUNTER — Encounter (HOSPITAL_COMMUNITY)
Admission: RE | Disposition: A | Discharge status: Home / Self Care | Payer: Self-pay | Source: Ambulatory Visit | Attending: Urology

## 2017-10-06 ENCOUNTER — Encounter (HOSPITAL_COMMUNITY): Payer: Self-pay | Admitting: Emergency Medicine

## 2017-10-06 ENCOUNTER — Ambulatory Visit (HOSPITAL_COMMUNITY)
Admission: RE | Admit: 2017-10-06 | Discharge: 2017-10-06 | Disposition: A | Discharge status: Home / Self Care | Payer: Medicare Other | Source: Ambulatory Visit | Attending: Urology | Admitting: Urology

## 2017-10-06 ENCOUNTER — Ambulatory Visit (HOSPITAL_COMMUNITY): Payer: Medicare Other

## 2017-10-06 DIAGNOSIS — Z79899 Other long term (current) drug therapy: Secondary | ICD-10-CM | POA: Insufficient documentation

## 2017-10-06 DIAGNOSIS — N131 Hydronephrosis with ureteral stricture, not elsewhere classified: Secondary | ICD-10-CM | POA: Insufficient documentation

## 2017-10-06 DIAGNOSIS — M1990 Unspecified osteoarthritis, unspecified site: Secondary | ICD-10-CM | POA: Insufficient documentation

## 2017-10-06 DIAGNOSIS — N261 Atrophy of kidney (terminal): Secondary | ICD-10-CM | POA: Diagnosis not present

## 2017-10-06 DIAGNOSIS — I110 Hypertensive heart disease with heart failure: Secondary | ICD-10-CM | POA: Diagnosis not present

## 2017-10-06 DIAGNOSIS — Z794 Long term (current) use of insulin: Secondary | ICD-10-CM | POA: Insufficient documentation

## 2017-10-06 DIAGNOSIS — I509 Heart failure, unspecified: Secondary | ICD-10-CM | POA: Insufficient documentation

## 2017-10-06 DIAGNOSIS — N135 Crossing vessel and stricture of ureter without hydronephrosis: Secondary | ICD-10-CM

## 2017-10-06 DIAGNOSIS — Z466 Encounter for fitting and adjustment of urinary device: Secondary | ICD-10-CM | POA: Diagnosis not present

## 2017-10-06 DIAGNOSIS — I714 Abdominal aortic aneurysm, without rupture: Secondary | ICD-10-CM | POA: Insufficient documentation

## 2017-10-06 DIAGNOSIS — K219 Gastro-esophageal reflux disease without esophagitis: Secondary | ICD-10-CM | POA: Diagnosis not present

## 2017-10-06 DIAGNOSIS — E039 Hypothyroidism, unspecified: Secondary | ICD-10-CM | POA: Diagnosis not present

## 2017-10-06 DIAGNOSIS — E7800 Pure hypercholesterolemia, unspecified: Secondary | ICD-10-CM | POA: Insufficient documentation

## 2017-10-06 DIAGNOSIS — I2510 Atherosclerotic heart disease of native coronary artery without angina pectoris: Secondary | ICD-10-CM | POA: Insufficient documentation

## 2017-10-06 DIAGNOSIS — I252 Old myocardial infarction: Secondary | ICD-10-CM | POA: Diagnosis not present

## 2017-10-06 DIAGNOSIS — I25810 Atherosclerosis of coronary artery bypass graft(s) without angina pectoris: Secondary | ICD-10-CM | POA: Diagnosis not present

## 2017-10-06 DIAGNOSIS — Z951 Presence of aortocoronary bypass graft: Secondary | ICD-10-CM | POA: Insufficient documentation

## 2017-10-06 DIAGNOSIS — E1151 Type 2 diabetes mellitus with diabetic peripheral angiopathy without gangrene: Secondary | ICD-10-CM | POA: Insufficient documentation

## 2017-10-06 DIAGNOSIS — Z7982 Long term (current) use of aspirin: Secondary | ICD-10-CM | POA: Insufficient documentation

## 2017-10-06 DIAGNOSIS — Z87891 Personal history of nicotine dependence: Secondary | ICD-10-CM | POA: Insufficient documentation

## 2017-10-06 DIAGNOSIS — E785 Hyperlipidemia, unspecified: Secondary | ICD-10-CM | POA: Diagnosis not present

## 2017-10-06 HISTORY — PX: CYSTOSCOPY WITH STENT PLACEMENT: SHX5790

## 2017-10-06 LAB — GLUCOSE, CAPILLARY
GLUCOSE-CAPILLARY: 117 mg/dL — AB (ref 70–99)
Glucose-Capillary: 109 mg/dL — ABNORMAL HIGH (ref 70–99)

## 2017-10-06 SURGERY — CYSTOSCOPY, WITH STENT INSERTION
Anesthesia: General | Laterality: Right

## 2017-10-06 MED ORDER — PROPOFOL 10 MG/ML IV BOLUS
INTRAVENOUS | Status: AC
  Filled 2017-10-06: qty 20

## 2017-10-06 MED ORDER — FENTANYL CITRATE (PF) 100 MCG/2ML IJ SOLN
INTRAMUSCULAR | Status: AC
  Filled 2017-10-06: qty 2

## 2017-10-06 MED ORDER — OXYCODONE HCL 5 MG PO TABS: 5.0000 mg | ORAL_TABLET | Freq: Once | ORAL | Status: DC | PRN

## 2017-10-06 MED ORDER — FENTANYL CITRATE (PF) 100 MCG/2ML IJ SOLN: 25.0000 ug | INTRAMUSCULAR | Status: DC | PRN

## 2017-10-06 MED ORDER — PROPOFOL 10 MG/ML IV BOLUS
INTRAVENOUS | Status: DC | PRN
  Administered 2017-10-06: 100 mg via INTRAVENOUS

## 2017-10-06 MED ORDER — LIDOCAINE HCL (CARDIAC) PF 100 MG/5ML IV SOSY
PREFILLED_SYRINGE | INTRAVENOUS | Status: DC | PRN
  Administered 2017-10-06: 100 mg via INTRAVENOUS

## 2017-10-06 MED ORDER — IOHEXOL 300 MG/ML  SOLN
INTRAMUSCULAR | Status: DC | PRN
  Administered 2017-10-06: 10 mL

## 2017-10-06 MED ORDER — GLYCOPYRROLATE PF 0.2 MG/ML IJ SOSY
PREFILLED_SYRINGE | INTRAMUSCULAR | Status: AC
  Filled 2017-10-06: qty 1

## 2017-10-06 MED ORDER — GLYCOPYRROLATE 0.2 MG/ML IJ SOLN
INTRAMUSCULAR | Status: DC | PRN
  Administered 2017-10-06: 0.3 mg via INTRAVENOUS

## 2017-10-06 MED ORDER — ONDANSETRON HCL 4 MG/2ML IJ SOLN
INTRAMUSCULAR | Status: DC | PRN
  Administered 2017-10-06: 4 mg via INTRAVENOUS

## 2017-10-06 MED ORDER — EPHEDRINE SULFATE 50 MG/ML IJ SOLN
INTRAMUSCULAR | Status: DC | PRN
  Administered 2017-10-06: 10 mg via INTRAVENOUS

## 2017-10-06 MED ORDER — FENTANYL CITRATE (PF) 100 MCG/2ML IJ SOLN
INTRAMUSCULAR | Status: DC | PRN
  Administered 2017-10-06 (×2): 50 ug via INTRAVENOUS

## 2017-10-06 MED ORDER — PHENYLEPHRINE 40 MCG/ML (10ML) SYRINGE FOR IV PUSH (FOR BLOOD PRESSURE SUPPORT)
PREFILLED_SYRINGE | INTRAVENOUS | Status: AC
  Filled 2017-10-06: qty 10

## 2017-10-06 MED ORDER — STERILE WATER FOR IRRIGATION IR SOLN
Status: DC | PRN
  Administered 2017-10-06: 3000 mL

## 2017-10-06 MED ORDER — OXYCODONE HCL 5 MG/5ML PO SOLN
5.0000 mg | Freq: Once | ORAL | Status: DC | PRN
  Filled 2017-10-06: qty 5

## 2017-10-06 MED ORDER — LACTATED RINGERS IV SOLN
INTRAVENOUS | Status: DC
  Administered 2017-10-06: 07:00:00 via INTRAVENOUS

## 2017-10-06 MED ORDER — EPHEDRINE 5 MG/ML INJ
INTRAVENOUS | Status: AC
  Filled 2017-10-06: qty 10

## 2017-10-06 MED ORDER — ONDANSETRON HCL 4 MG/2ML IJ SOLN: 4.0000 mg | Freq: Four times a day (QID) | INTRAMUSCULAR | Status: DC | PRN

## 2017-10-06 MED ORDER — CEFAZOLIN SODIUM-DEXTROSE 2-4 GM/100ML-% IV SOLN
2.0000 g | INTRAVENOUS | Status: AC
  Administered 2017-10-06: 2 g via INTRAVENOUS
  Filled 2017-10-06: qty 100

## 2017-10-06 SURGICAL SUPPLY — 12 items
BAG URO CATCHER STRL LF (MISCELLANEOUS) ×3 IMPLANT
CATH URET 5FR 28IN OPEN ENDED (CATHETERS) ×3 IMPLANT
CLOTH BEACON ORANGE TIMEOUT ST (SAFETY) ×3 IMPLANT
COVER FOOTSWITCH UNIV (MISCELLANEOUS) IMPLANT
GLOVE BIOGEL M STRL SZ7.5 (GLOVE) ×3 IMPLANT
GOWN STRL REUS W/TWL LRG LVL3 (GOWN DISPOSABLE) ×6 IMPLANT
GUIDEWIRE STR DUAL SENSOR (WIRE) ×3 IMPLANT
MANIFOLD NEPTUNE II (INSTRUMENTS) ×3 IMPLANT
PACK CYSTO (CUSTOM PROCEDURE TRAY) ×3 IMPLANT
STENT URET 6FRX24 CONTOUR (STENTS) ×3 IMPLANT
TUBING CONNECTING 10 (TUBING) ×2 IMPLANT
TUBING CONNECTING 10' (TUBING) ×1

## 2017-10-06 NOTE — Anesthesia Procedure Notes (Signed)
Procedure Name: LMA Insertion Date/Time: 10/06/2017 8:44 AM Performed by: Lavina Hamman, CRNA Pre-anesthesia Checklist: Patient identified, Emergency Drugs available, Suction available, Patient being monitored and Timeout performed Patient Re-evaluated:Patient Re-evaluated prior to induction Oxygen Delivery Method: Circle system utilized Preoxygenation: Pre-oxygenation with 100% oxygen Induction Type: IV induction Ventilation: Mask ventilation without difficulty LMA: LMA inserted Tube size: 4.0 mm Number of attempts: 1 Placement Confirmation: positive ETCO2 and breath sounds checked- equal and bilateral Tube secured with: Tape Dental Injury: Teeth and Oropharynx as per pre-operative assessment

## 2017-10-06 NOTE — Discharge Instructions (Signed)
DISCHARGE INSTRUCTIONS FOR KIDNEY STONE/URETERAL STENT   MEDICATIONS:  1.  Resume all your other meds from home - except do not take any extra narcotic pain meds that you may have at home.    ACTIVITY:  1. No strenuous activity x 1week  2. No driving while on narcotic pain medications  3. Drink plenty of water  4. Continue to walk at home - you can still get blood clots when you are at home, so keep active, but don't over do it.  5. May return to work/school tomorrow or when you feel ready   BATHING:  1. You can shower and we recommend daily showers     SIGNS/SYMPTOMS TO CALL:  Please call us if you have a fever greater than 101.5, uncontrolled nausea/vomiting, uncontrolled pain, dizziness, unable to urinate, bloody urine, chest pain, shortness of breath, leg swelling, leg pain, redness around wound, drainage from wound, or any other concerns or questions.   You can reach [place] at [phone].   FOLLOW-UP:  1. You have an appointment in December with Dr. Louis Meckel

## 2017-10-06 NOTE — Transfer of Care (Signed)
Immediate Anesthesia Transfer of Care Note  Patient: [name] [name]  Procedure(s) Performed: Procedure(s): CYSTOSCOPY, RETROGRADE  WITH RIGHT STENT EXCHANGE (Right)  Patient Location: PACU  Anesthesia Type:General  Level of Consciousness:  sedated, patient cooperative and responds to stimulation  Airway & Oxygen Therapy:Patient Spontanous Breathing and Patient connected to face mask oxgen  Post-op Assessment:  Report given to PACU RN and Post -op Vital signs reviewed and stable  Post vital signs:  Reviewed and stable  Last Vitals:  Vitals:   10/06/17 0651  BP: (!) 175/87  Pulse: (!) 57  Resp: 16  Temp: 36.6 C  SpO2: 241%    Complications: No apparent anesthesia complications

## 2017-10-06 NOTE — H&P (Signed)
Ureteral Stricture  HPI: [name] [name] is a 77 year-old female established patient who is here for further eval and management of a ureteral stricture.  Her problem has been present for a few years. She has had previous surgery as a possible source of her ureteral stricture. She has had chronic stent for treatment of their ureteral stricture.   She is currently not having trouble urinating. She is not having problems getting her urine stream started. The patient does not complain of urinary leakage. She is not urinating more frequently now than usual. She does not dribble at the end of urination.   She is not having flank pain. She does have a history of urinary infections. Her renal function is normal. She did not see blood in her urine.   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 The left side was initially obstructed and managed with a stent. Differential function demonstrated little function and stent was removed. Right hydro developed in Fall 2016. No being managed with ureteral stents, not a good surgical candidate otherwise.   12/18: She states being hospitalized for dehydration and urinary tract infection over the Thanksgiving holiday.   Intv: Presents today for pre-op eval for right stent exchange. The patient has not had any hospitalizations or infections since she was last seen. She is tolerating stent well without any significant symptoms. She denies any frequency, hematuria, or dysuria.     ALLERGIES: None   MEDICATIONS: Crestor 20 mg tablet  Levothyroxine Sodium 88 mcg tablet  Albuterol Sulfate 2.5 mg/3 ml (0.083 %) vial, nebulizer Inhalation  Aspirin 81 MG TABS Oral  Calcium/Vitamin D/Minerals TABS Oral  Ferrous Sulfate 325 (65 Fe) MG Oral Tablet Delayed Release Oral  Furosemide 20 mg tablet Oral  Hydrocodone-Acetaminophen 5 mg-325 mg tablet  Isosorbide Mononitrate Er 30 mg tablet, extended release 24 hr  Latanoprost 0.005 % drops  Levemir FlexPen 100 UNIT/ML  SOLN Subcutaneous  Lidoderm 5 % adhesive patch, medicated  Lovaza 1 gram capsule  Metoprolol Succinate 25 mg tablet, extended release 24 hr Oral  Miralax  Novolog Flexpen 100 unit/ml insulin pen Subcutaneous  Omeprazole 20 mg capsule,delayed release Oral  Vitamin B-12 TABS Oral  Vitamin C TABS Oral  Vitamin D2 50,000 unit capsule Oral     GU PSH: Cystoscopy Insert Stent, Right - 03/18/2017, Right - 09/17/2016, Right - 02/13/2016, 2017, 2017, 2014, 2014 Hysterectomy Unilat SO - 2014      [place] Notes: Cystoscopy With Insertion Of Ureteral Stent Right, Cystoscopy With Insertion Of Ureteral Stent Right, Cesarean Section, Hysterectomy, Coronary Artery Single Venous Bypass Graft, Cystoscopy With Insertion Of Ureteral Stent, Cystoscopy With Insertion Of Ureteral Stent Left   NON-GU PSH: CABG (coronary artery bypass grafting) - 2014 Cesarean Delivery Only - 2014 Cholecystectomy (open), 09/2015    GU PMH: Ureteral stricture, Right - 02/08/2017, Right, 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 This is being managed withchronic indwelling stents. She is due to have her stent changed soon, her last stent was exchanged 6 months prior. 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 She had a stent placed in the spring of 2015 followed by renogram demonstrating 18% function  on the left kidney. Her stent was subsequently removed.  Recently she developed bilateral hydronephrosis, renogram demonstrated bilateral obstruction with 85% right function and 15% left sided function a significant change from her previous  ultrasounds and renograms. In Jan 2015 she had no hydro or evidence of right sided obstruction.   02/21/15 bilateral RPGs- left side showed a normal distal ureter to the pelvic brim where it stopped abruptly and I was unable to opacify the proximal portion. On the right side showed a normal distal ureter with abrupt narrowing at the pelvic brim and proximal dilation. A right sided stent was placed.   She has a history of recurrent infections. In 2015 she had 4 Escherichia coli infections.   03/2015: treated for UTI - keflex - no growth. recommdended lactobacillus, as well as cranberry tablets twice daily   2/17: BUN/Cr- 30/1.78     NON-GU PMH: Encounter for general adult medical examination without abnormal findings, Encounter for preventive health examination - 2016 Personal history of other diseases of the circulatory system, History of hypertension - 2014, History of cardiac disorder, - 2014 Personal history of other endocrine, nutritional and metabolic disease, History of hypothyroidism - 2014, History of diabetes mellitus, - 2014, History of hypercholesterolemia, - 2014    FAMILY HISTORY: Diabetes - Mother Family Health Status Number - Father Father Deceased At Age14 ___ - Father Hypertension - Runs In Family Mother Deceased At Age 8 from diabetic complicati - Father nephrolithiasis - Runs In Family Transient Ischemic Attack - Mother   SOCIAL HISTORY: Marital Status: Single Preferred Language: English; Ethnicity: Not Hispanic Or Latino; Race: White     Notes: Former smoker, Tobacco use, Marital History - Single, Retired From Work, Alcohol Use, Caffeine Use   REVIEW OF SYSTEMS:    GU Review Female:   Patient denies frequent urination, hard to postpone urination, burning /pain with urination, get up at night to urinate, leakage of urine, stream starts and stops, trouble starting your stream, have to strain to urinate, and being pregnant.  Gastrointestinal (Upper):   Patient denies  nausea, vomiting, and indigestion/ heartburn.  Gastrointestinal (Lower):   Patient denies diarrhea and constipation.  Constitutional:   Patient denies fever, night sweats, weight loss, and fatigue.  Skin:   Patient denies skin rash/ lesion and itching.  Eyes:   Patient denies blurred vision and double vision.  Ears/ Nose/ Throat:   Patient denies sore throat and sinus problems.  Hematologic/Lymphatic:   Patient denies swollen glands and easy bruising.  Cardiovascular:   Patient denies leg swelling and chest pains.  Respiratory:   Patient denies cough and shortness of breath.  Endocrine:   Patient denies excessive thirst.  Musculoskeletal:   Patient denies joint pain and back pain.  Neurological:   Patient denies headaches and dizziness.  Psychologic:   Patient denies depression and anxiety.   VITAL SIGNS:      08/08/2017 02:19 PM  Weight 140 lb / 63.5 kg  Height 67 in / 170.18 cm  BP 150/70 mmHg  Heart Rate 52 /min  Temperature 97.4 F / 36.3 C  BMI 21.9 kg/m   MULTI-SYSTEM PHYSICAL EXAMINATION:    Constitutional: Well-nourished. No physical deformities. Normally developed. Good grooming.  Neck: Neck symmetrical, not swollen. Normal tracheal position.  Respiratory: No labored breathing, no use of accessory muscles. Normal breath sounds.  Cardiovascular: Regular rate and rhythm. No murmur, no gallop. Normal temperature, normal extremity pulses, no swelling, no varicosities.  Lymphatic: No enlargement of neck, axillae, groin.  Skin: No paleness, no jaundice, no cyanosis. No lesion, no ulcer, no rash.  Neurologic / Psychiatric: Oriented to time, oriented to place, oriented to person. No depression, no anxiety, no agitation.  Gastrointestinal: No mass, no tenderness, no rigidity, non obese abdomen.  Eyes: Normal conjunctivae. Normal eyelids.  Ears, Nose, Mouth, and Throat: Left ear no scars, no lesions, no masses. Right ear no scars, no lesions, no masses. Nose no scars, no lesions, no  masses. Normal hearing. Normal lips.  Musculoskeletal: Normal gait and station of head and neck.     PAST DATA REVIEWED:  Source Of History:  Patient  Records Review:   Previous Doctor Records, Previous Hospital Records, Previous Patient Records  Urine Test Review:   Urinalysis   PROCEDURES:          Urinalysis w/Scope Dipstick Dipstick Cont'd Micro  Color: Yellow Bilirubin: Neg WBC/hpf: >60/hpf  Appearance: Cloudy Ketones: Neg RBC/hpf: NS (Not Seen)  Specific Gravity: 1.015 Blood: 1+ Bacteria: Many (>50/hpf)  pH: 5.5 Protein: 2+ Cystals: NS (Not Seen)  Glucose: Neg Urobilinogen: 0.2 Casts: NS (Not Seen)    Nitrites: Neg Trichomonas: Not Present    Leukocyte Esterase: 2+ Mucous: Present      Epithelial Cells: 0 - 5/hpf      Yeast: NS (Not Seen)      Sperm: Not Present    ASSESSMENT:      ICD-10 Details  1 GU:   Ureteral stricture - N13.5    PLAN:           Orders Labs Urine Culture          Schedule Return Visit/Planned Activity: ASAP - Schedule Surgery          Document Letter(s):  Created for Patient: Clinical Summary         Notes:   The patient is doing very well. She has not had any issues in the last 5 months including dysuria or hematuria. She has not been treated for urinary tract infections. She has managed to stay out of the hospital.   The patient needs her right stent exchanged under anesthesia. We'll try to get this done 6 months and the last one which was in December. I did send the patient's urine culture today we will plan to have her treated prior to her procedure.

## 2017-10-06 NOTE — OR Nursing (Signed)
Patient ambulatory to restroom with walker assistance. Patient had a steady gait.  Patient tolerating fluids and had a urine occurrence during this time. Urine was clear. Patient denies pain, NAD at this time.

## 2017-10-06 NOTE — Anesthesia Preprocedure Evaluation (Signed)
Anesthesia Evaluation  Patient identified by MRN, date of birth, ID band Patient awake    Reviewed: Allergy & Precautions, H&P , NPO status , Patient's Chart, lab work & pertinent test results  Airway Mallampati: II   Neck ROM: full    Dental   Pulmonary former smoker,    breath sounds clear to auscultation       Cardiovascular hypertension, + CAD, + Past MI, + CABG, + Peripheral Vascular Disease, +CHF and + DOE   Rhythm:regular Rate:Normal  EF 40%.  AAA   Neuro/Psych    GI/Hepatic GERD  ,  Endo/Other  diabetes, Type 2Hypothyroidism   Renal/GU Renal InsufficiencyRenal disease     Musculoskeletal  (+) Arthritis ,   Abdominal   Peds  Hematology   Anesthesia Other Findings   Reproductive/Obstetrics                             Anesthesia Physical Anesthesia Plan  ASA: IV  Anesthesia Plan: General   Post-op Pain Management:    Induction: Intravenous  PONV Risk Score and Plan: 3 and Ondansetron and Treatment may vary due to age or medical condition  Airway Management Planned: LMA  Additional Equipment:   Intra-op Plan:   Post-operative Plan:   Informed Consent: I have reviewed the patients History and Physical, chart, labs and discussed the procedure including the risks, benefits and alternatives for the proposed anesthesia with the patient or authorized representative who has indicated his/her understanding and acceptance.     Plan Discussed with: CRNA, Anesthesiologist and Surgeon  Anesthesia Plan Comments:         Anesthesia Quick Evaluation

## 2017-10-06 NOTE — Anesthesia Postprocedure Evaluation (Signed)
Anesthesia Post Note  Patient: [name] Rozycki  Procedure(s) Performed: CYSTOSCOPY, RETROGRADE  WITH RIGHT STENT EXCHANGE (Right )     Patient location during evaluation: PACU Anesthesia Type: General Level of consciousness: awake and alert Pain management: pain level controlled Vital Signs Assessment: post-procedure vital signs reviewed and stable Respiratory status: spontaneous breathing, nonlabored ventilation, respiratory function stable and patient connected to nasal cannula oxygen Cardiovascular status: blood pressure returned to baseline and stable Postop Assessment: no apparent nausea or vomiting Anesthetic complications: no    Last Vitals:  Vitals:   10/06/17 1000 10/06/17 1016  BP: (!) 188/65 (!) 171/59  Pulse: (!) 59 61  Resp: 17 20  Temp: (!) 36.3 C (!) 36.3 C  SpO2: 96% 98%    Last Pain:  Vitals:   10/06/17 1000  TempSrc:   PainSc: 0-No pain                 Loralyn Rachel S

## 2017-10-06 NOTE — Op Note (Signed)
Preoperative diagnosis:  1. Right ureteral stricture  Postoperative diagnosis:  1. Same  Procedure: 1. Cystoscopy, right retrograde pyelogram with interpretation 2. Right ureteral stent exchange  Surgeon: Ardis Hughs, MD  Anesthesia: General  Complications: None  Intraoperative findings: 1.:  The patient's bladder was normal in appearance aside from the areas of the bladder mucosa where the  stent had created some irritation.  This demonstrated an area of tissue that was sloughing and appeared nonviable.  This was at the bladder dome directly overlying the right trigone.  The remainder of the bladder was normal.  2.:  The right retrograde pyelogram demonstrated a ureteral stricture that went up past the sacrum and there was medial dilation.  There was mild significant hydroureteronephrosis, and no filling defects.  3.:  A 24 cm x 6 French double-J stent was exchanged.  EBL: Minimal  Specimens: None  Indication: [name] [name] is a 77 y.o. patient with history of ureteral strictures.   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  As such, we are exchanging stents on the right side only.  These are being changed every 6 months.  After reviewing the management options for treatment, he elected to proceed with the above surgical procedure(s). We have discussed the potential benefits and risks of the procedure, side effects of the proposed treatment, the likelihood of the patient achieving the goals of the procedure, and any potential problems that might occur during the procedure or recuperation. Informed consent has been obtained.  Description of procedure:  The patient was taken to the operating room and general anesthesia was induced.  The patient was placed in the dorsal lithotomy position, prepped and draped in the usual sterile fashion, and preoperative antibiotics were administered. A preoperative time-out was performed.   A 30 degree 21 French cystoscope was gently  passed to the patient's urethra and into the bladder under visual guidance.   300 degrees cystoscopic evaluation which demonstrated the above findings.  Using a stent grasper the stent was grasped and pulled to the urethral meatus.  A 0.38 sensor wire was then advanced through the stent and into the right renal pelvis removing the stent over the wire.  The stent was noted to be in fairly good condition without any significant encrustation.  The wire was then exchanged for an open-ended catheter and a retrograde pyelogram was then performed with the above findings.  The wire was then repassed exchanged to the open-ended catheter and then a 24 cm x 6 French double-J stent was advanced over the wire and into the right renal pelvis under fluoroscopic guidance.  Once the stent was noted to be well within the renal pelvis it was advanced to the urethral meatus and the wire was removed.  The beak of the cystoscope was then gently passed into the patient's urethra and fluoroscopic copy demonstrated a nice curl within the bladder.  The patient was subsequently extubated and returned to the PACU in stable condition.  She will be scheduled for follow-up stent exchange in 6 months.  Ardis Hughs, M.D.

## 2017-10-07 ENCOUNTER — Other Ambulatory Visit: Payer: Medicare Other

## 2017-10-07 ENCOUNTER — Encounter (HOSPITAL_COMMUNITY): Payer: Self-pay | Admitting: Urology

## 2017-10-12 ENCOUNTER — Encounter: Payer: Self-pay | Admitting: Endocrinology

## 2017-10-12 ENCOUNTER — Ambulatory Visit (INDEPENDENT_AMBULATORY_CARE_PROVIDER_SITE_OTHER): Payer: Medicare Other | Admitting: Endocrinology

## 2017-10-12 VITALS — BP 128/76 | HR 86 | Ht 66.0 in | Wt 138.0 lb

## 2017-10-12 DIAGNOSIS — E119 Type 2 diabetes mellitus without complications: Secondary | ICD-10-CM

## 2017-10-12 DIAGNOSIS — N183 Chronic kidney disease, stage 3 unspecified: Secondary | ICD-10-CM

## 2017-10-12 DIAGNOSIS — E063 Autoimmune thyroiditis: Secondary | ICD-10-CM

## 2017-10-12 DIAGNOSIS — I6523 Occlusion and stenosis of bilateral carotid arteries: Secondary | ICD-10-CM

## 2017-10-12 NOTE — Progress Notes (Signed)
Patient ID: [name] [name], female   DOB: 1940/07/07, 77 y.o.   MRN: [id number]   Reason for Appointment: Follow-up of various problems  History of Present Illness    RENAL dysfunction/HYPERKALEMIA:  Renal function is variable and this year has been fairly stable  Does not have diabetic nephropathy Her only blood pressure treatment is metoprolol from cardiologist  She has been told to cut back on high potassium foods and given a list of high potassium foods She thinks she is following the diet Her potassium is high normal recently   Lab Results  Component Value Date   CREATININE 1.78 (H) 10/03/2017   CREATININE 1.68 (H) 08/10/2017   CREATININE 1.74 (H) 04/04/2017   CREATININE 1.75 (H) 03/15/2017   Lab Results  Component Value Date   K 5.0 10/03/2017      Type 2 DIABETES mellitus, date of diagnosis: 1986.   The insulin regimen is: Levemir 10 units hs, Novolog 3 ac twice a day   Type 2 diabetes  has been treated in the last few years with low dose basal bolus insulin regimen. She has not been taking  any oral hypoglycemic drugs   A1c is again near normal at 5.9, previous range 5.4-6.3 and not after meals   Current management and problems:  She did bring her monitor  Again she is checking blood sugars mostly in the mornings or midday    And not after meals  Blood sugars are quite stable just above 100 most of the time  She is taking only small doses of NovoLog and occasionally for small meal she will not take any  Her weight is relatively higher now  She is not able to exercise again and limited because of back pain  Side effects from medications: Diarrhea from metformin and nausea and vomiting from GLP-1 drugs   Monitors blood glucose: Less than 1 times a day  Glucometer: One Touch.   Blood sugar readings by DOWNLOAD:  MORNING blood sugars range from 107 up to 168 Midday 118-170  AVERAGE overall 135  Meals: she is usually eating low  fat meals especially recently meals usually at 11 AM,and supper at 5 pm.   Calorie intake: Usually controlled. Moderate Carbs Physical activity: exercise: Minimal  Dietician visit: Most recent:, 5/13.   Wt Readings from Last 3 Encounters:  10/12/17 138 lb (62.6 kg)  10/06/17 137 lb (62.1 kg)  10/03/17 137 lb (62.1 kg)    Lab Results  Component Value Date   HGBA1C 5.9 08/10/2017   HGBA1C 5.4 03/15/2017   HGBA1C 6.3 12/27/2016   Lab Results  Component Value Date   MICROALBUR 19.4 (H) 12/27/2016   LDLCALC 60 05/21/2016   CREATININE 1.78 (H) 10/03/2017    ANEMIA:  Taking iron supplement, previously iron saturation was low and this is now normal  Improved hemoglobin recently:   Lab Results  Component Value Date   WBC 9.8 10/03/2017   HGB 12.1 10/03/2017   HCT 37.9 10/03/2017   MCV 95.2 10/03/2017   PLT 246 10/03/2017      HYPOTHYROIDISM: She has had long-standing primary hypothyroidism She is taking Levothyroxine, currently 125 mcg  Previously was taking a lower dose and her TSH was unexpectedly high in June when she was complaining of feeling more tired and sleepy  She takes levothyroxine consistently in the morning and is taking the iron in the evening  She complains of feeling sleepy in the afternoons only and may need a nap  but no other unusual fatigue  TSH is back to normal  Lab Results  Component Value Date   TSH 1.03 10/04/2017   TSH 43.69 (H) 08/10/2017   TSH 4.57 (H) 04/04/2017   FREET4 1.44 10/04/2017   FREET4 0.84 08/10/2017   FREET4 1.20 12/27/2016    OTHER active problems: See review of systems    Allergies as of 10/12/2017   No Known Allergies     Medication List        Accurate as of 10/12/17  1:51 PM. Always use your most recent med list.          acetaminophen 650 MG CR tablet Commonly known as:  TYLENOL Take 650-1,300 mg by mouth every 8 (eight) hours as needed for pain.   albuterol 108 (90 Base) MCG/ACT inhaler Commonly  known as:  PROVENTIL HFA;VENTOLIN HFA Inhale 2 puffs every 6 (six) hours as needed into the lungs for wheezing or shortness of breath.   aspirin 81 MG chewable tablet Chew 81 mg by mouth daily.   calcium-vitamin D 500-200 MG-UNIT tablet Commonly known as:  OSCAL WITH D Take 1 tablet by mouth every morning.   ferrous sulfate 325 (65 FE) MG tablet Take 325 mg by mouth every evening.   glucose blood test strip Commonly known as:  ONE TOUCH ULTRA TEST Use to test blood sugar 1-2 times daily Dx code- E11.9   HYDROcodone-acetaminophen 5-325 MG tablet Commonly known as:  NORCO/VICODIN Take 1 tablet by mouth 2 (two) times daily as needed for moderate pain or severe pain.   IMODIUM A-D 2 MG tablet Generic drug:  loperamide Take 2 mg by mouth as needed for diarrhea or loose stools.   Insulin Pen Needle 32G X 4 MM Misc Use 4 times daily   isosorbide mononitrate 30 MG 24 hr tablet Commonly known as:  IMDUR TAKE 1 TABLET(30 MG) BY MOUTH DAILY   latanoprost 0.005 % ophthalmic solution Commonly known as:  XALATAN Place 1 drop into both eyes at bedtime.   LEVEMIR 100 UNIT/ML injection Generic drug:  insulin detemir Inject 10 Units into the skin at bedtime.   levothyroxine 125 MCG tablet Commonly known as:  SYNTHROID, LEVOTHROID Take 1 tablet (125 mcg total) by mouth daily.   lidocaine 5 % Commonly known as:  LIDODERM Place 1 patch onto the skin daily. Remove & Discard patch within 12 hours or as directed by MD   metoprolol succinate 25 MG 24 hr tablet Commonly known as:  TOPROL-XL TAKE 1 TABLET DAILY   NOVOLOG FLEXPEN 100 UNIT/ML FlexPen Generic drug:  insulin aspart INJECT 8 UNITS 3 TIMES A   DAY WITH MEALS   omeprazole 20 MG capsule Commonly known as:  PRILOSEC TAKE 1 CAPSULE DAILY   polyethylene glycol packet Commonly known as:  MIRALAX / GLYCOLAX Take 17 g by mouth daily as needed for moderate constipation.   rosuvastatin 20 MG tablet Commonly known as:   CRESTOR TAKE 1 TABLET DAILY   vitamin B-12 1000 MCG tablet Commonly known as:  CYANOCOBALAMIN Take 1,000 mcg by mouth daily.   vitamin C 500 MG tablet Commonly known as:  ASCORBIC ACID Take 500 mg by mouth at bedtime.   Vitamin D (Ergocalciferol) 50000 units Caps capsule Commonly known as:  DRISDOL TAKE ONE CAPSULE BY MOUTH EVERY 7 DAYS( ON THURSDAY)       Allergies: No Known Allergies  Past Medical History:  Diagnosis Date  . AAA (abdominal aortic aneurysm) ([place])   . Bilateral  hydronephrosis   . Carotid artery stenosis    carotid doppler 06/2012 - Right CCA/Bulb/ICA with chronic occlusion; L vertebral artery with abnormal blood flow; L Bulb/Prox ICA  s/p endarterectomy with mild fibrous plaque, 50% diameter reduction  . CHF (congestive heart failure) ([place])   . Choledocholithiasis 2017  . CKD (chronic kidney disease), stage III ([place])   . Coronary artery disease due to lipid rich plaque cardiologist-  dr berry   s/p CABG x6 1997-- cath 12-09-2009 occluded vein to obtuse marginal branch and ramus branch with patent vien to PDA and patent LIMA to LAD, ef 40%-- Myoview 11-24-2011, nonischemic  . Diverticulosis   . Dyspnea on exertion   . GERD (gastroesophageal reflux disease)   . GI bleed   . History of sepsis    10-18-2014 w/ acute pyelonephritis  . Hyperlipidemia   . Hypertension   . Hypothyroidism   . Ischemic cardiomyopathy    03-25-2010-- per lasts echo EF  50-55%  . PAD (peripheral artery disease) ([place])    09/2010 LEAs - R ABI of 0.45, occluded fem-pop bypass graft, L ABI of 0.59 with occluded SFA; severe arterial insuff  . PVD (peripheral vascular disease) with claudication ([place])    last duplex 07-04-2015 -- Right CCA and ICA chronic occlusion, 29-24% LICA, Patent vertebral arteries w/ antegrade flow, bilateral normal subclavian arteries   . Retroperitoneal fibrosis   . Tubular adenoma of colon   . Type 2 diabetes mellitus ([place])    monitored by dr Dwyane Dee    Past  Surgical History:  Procedure Laterality Date  . AORTA - BILATERAL FEMORAL ARTERY BYPASS GRAFT  1997   and RIGHT FEM-POP   . CARDIAC CATHETERIZATION  12-09-2009  dr al little   EF >40%-- occluded vein to OM & ramus branches, patent vein to PDA, patent LIMA to LAD (Dr. [name], [facility] - later had thrombectomy of R fem-pop bypass ad R common femoral & profunda femoris artery (Dr. Oneida Alar)  . CARDIOVASCULAR STRESS TEST  11-24-2011   dr berry   Low Risk study: fixed basal to mid inferior attenuation artifact, no reversible ischemia,  normal LV function and wall motion , ef 67%  . CAROTID ENDARTERECTOMY Bilateral right 1994//  left ?  Marland Kitchen CATARACT EXTRACTION W/ INTRAOCULAR LENS  IMPLANT, BILATERAL  2006  . CHOLECYSTECTOMY N/A 09/05/2015   Procedure: ATTEMPTED LAPAROSCOPIC CHOLECYSTECTOMY, EXPLORATORY LAPAROTOMY WITH CHOLECYSTECTOMY;  Surgeon: Autumn Messing III, MD;  Location: [place];  Service: General;  Laterality: N/A;  . COLONOSCOPY    . COLONOSCOPY  10/21/2015  . COLONOSCOPY WITH PROPOFOL N/A 10/21/2015   Procedure: COLONOSCOPY WITH PROPOFOL;  Surgeon: Milus Banister, MD;  Location: [place];  Service: Endoscopy;  Laterality: N/A;  . CORONARY ARTERY BYPASS GRAFT  1997   x6; internal mammary to LAD, SVG to ramus #1 & #2, SVG to OM, SVG to PDA,   . CYSTOSCOPY W/ RETROGRADES Right 08/01/2015   Procedure: CYSTOSCOPY WITH RETROGRADE PYELOGRAM;  Surgeon: Ardis Hughs, MD;  Location: [facility];  Service: Urology;  Laterality: Right;  . CYSTOSCOPY W/ URETERAL STENT PLACEMENT  03/10/2012   Procedure: CYSTOSCOPY WITH RETROGRADE PYELOGRAM/URETERAL STENT PLACEMENT;  Surgeon: Hanley Ben, MD;  Location: [facility];  Service: Urology;  Laterality: Left;  . CYSTOSCOPY W/ URETERAL STENT PLACEMENT Bilateral 03/07/2015   Procedure: BILATERAL RETROGRADE PYELOGRAM AND RIGHT URETERAL STENT PLACEMENT;  Surgeon: Ardis Hughs, MD;  Location: [facility];  Service: Urology;  Laterality:  Bilateral;  . CYSTOSCOPY  W/ URETERAL STENT PLACEMENT Right 08/01/2015   Procedure: CYSTOSCOPY WITH STENT REPLACEMENT;  Surgeon: Ardis Hughs, MD;  Location: [facility];  Service: Urology;  Laterality: Right;  . CYSTOSCOPY W/ URETERAL STENT PLACEMENT Right 02/13/2016   Procedure: RIGHT URETERAL STENT EXCHANGE;  Surgeon: Ardis Hughs, MD;  Location: [facility];  Service: Urology;  Laterality: Right;  . CYSTOSCOPY W/ URETERAL STENT PLACEMENT Right 09/17/2016   Procedure: CYSTOSCOPY WITH RIGHT  RETROGRADE PYELOGRAM RIGHT URETERAL STENT EXCHANGE;  Surgeon: Ardis Hughs, MD;  Location: [facility];  Service: Urology;  Laterality: Right;  . CYSTOSCOPY W/ URETERAL STENT PLACEMENT Bilateral 03/18/2017   Procedure: CYSTOSCOPY WITH BILATERAL  RETROGRADE PYELOGRAM RIGHT URETERAL STENT [place];  Surgeon: Ardis Hughs, MD;  Location: [facility];  Service: Urology;  Laterality: Bilateral;  . CYSTOSCOPY WITH STENT PLACEMENT Right 10/06/2017   Procedure: CYSTOSCOPY, RETROGRADE  WITH RIGHT STENT EXCHANGE;  Surgeon: Ardis Hughs, MD;  Location: [facility];  Service: Urology;  Laterality: Right;  . ERCP N/A 09/03/2015   Procedure: ENDOSCOPIC RETROGRADE CHOLANGIOPANCREATOGRAPHY (ERCP);  Surgeon: Irene Shipper, MD;  Location[facility];  Service: Endoscopy;  Laterality: N/A;  . ESOPHAGOGASTRODUODENOSCOPY    . ESOPHAGOGASTRODUODENOSCOPY N/A 10/19/2015   Procedure: ESOPHAGOGASTRODUODENOSCOPY (EGD);  Surgeon: Gatha Mayer, MD;  Location: [facility] ENDOSCOPY;  Service: Endoscopy;  Laterality: N/A;  . GIVENS CAPSULE STUDY  10/21/2015  . GIVENS CAPSULE STUDY N/A 10/21/2015   Procedure: GIVENS CAPSULE STUDY;  Surgeon: Milus Banister, MD;  Location: [place];  Service: Endoscopy;  Laterality: N/A;  . REPAIR RIGHT FEMORAL PSEUDOANEUYSM/  RIGHT FEM-POP BYPASS GRAFT/  DEBRIDEMENT RIGHT LOWER EXTREMITIY VENOUS STATUS ULCERS X2  01-12-2005  . TOTAL ABDOMINAL HYSTERECTOMY W/ BILATERAL  SALPINGOOPHORECTOMY  1986  . TRANSTHORACIC ECHOCARDIOGRAM  03/25/2010   EF 06-26%, LV systolic function low normal with mild inferoseptal hypocontractility; LA mildly dilated; mod MR; mild TR, RV systolic pressure elevated, mild pulm HTN; AV mildly sclerotic; mild pulm valve regurg; aortic root sclerosis/calcif     Family History  Problem Relation Age of Onset  . Congestive Heart Failure Mother   . Diabetes Mother   . Stroke Father   . Cancer Maternal Aunt        Breast cancer    Social History:  reports that she quit smoking about 8 years ago. She has a 60.00 pack-year smoking history. She has never used smokeless tobacco. She reports that she drinks about 1.8 oz of alcohol per week. She reports that she does not use drugs.  Review of Systems -     Hyperlipidemia: Has history of high LDL and triglycerides/low HDL Last LDL below 70 with Crestor 20 mg   Tends to have high triglycerides    Lab Results  Component Value Date   CHOL 121 10/04/2017   HDL 29.70 (L) 10/04/2017   LDLCALC 60 05/21/2016   LDLDIRECT 59.0 10/04/2017   TRIG 221.0 (H) 10/04/2017   CHOLHDL 4 10/04/2017     Vitamin D deficiency: She has been on supplements and taking her 50,000 units every other week      CHF: No hospitalizations since 2017 This has been followed by cardiologist She has a low ejection fraction of 20-25 %  Currently on metoprolol and Lasix as needed Also she had atrial flutter in 2018  No recent symptoms of dyspnea  DIARRHEA: This is better with stopping magnesium  Examination:   BP 128/76 (BP Location: Left Arm, Patient Position: Sitting, Cuff Size: Normal)  Pulse 86   Ht 5\' 6"  (1.676 m)   Wt 138 lb (62.6 kg)   SpO2 94%   BMI 22.27 kg/m   Body mass index is 22.27 kg/m.    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 Now potassium is upper normal Reminded her to stay on a low potassium diet  ANEMIA: Likely to be from chronic disease with some iron deficiency and labs are all normal now She will stay on iron  DIABETES on low-dose insulin:  She has only occasional higher readings in the morning but discussed need to check more readings after meals Last A1c was 5.9  HYPOTHYROIDISM:   With going back to around 25 mcg of levothyroxine her TSH is back to normal and she will continue the same dose  Daytime somnolence: She is still having symptoms and may be related to some degree of insomnia  Chronic low back pain: She is taking hydrocodone as needed and will continue to give her this prescription for 60 tablets  History of CHF: Doing well, exam normal today and she will continue follow-up with cardiologist No recurrence of atrial arrhythmia      There are no Patient Instructions on file for this visit.    [name] [name]  10/12/17    Note: This office note was prepared with Dragon voice recognition system technology. Any transcriptional errors that result from this process are unintentional.    -

## 2017-10-12 NOTE — Patient Instructions (Signed)
Check blood sugars on waking up  3/7   Also check blood sugars about 2 hours after a meal and do this after different meals by rotation  Recommended blood sugar levels on waking up is 90-130 and about 2 hours after meal is 130-160  Please bring your blood sugar monitor to each visit, thank you

## 2017-10-20 ENCOUNTER — Telehealth: Payer: Self-pay

## 2017-10-20 NOTE — Telephone Encounter (Signed)
Patient requesting refill on hydrocodone.

## 2017-10-21 ENCOUNTER — Other Ambulatory Visit: Payer: Self-pay | Admitting: Endocrinology

## 2017-10-21 MED ORDER — HYDROCODONE-ACETAMINOPHEN 5-325 MG PO TABS: 1.0000 | ORAL_TABLET | Freq: Two times a day (BID) | ORAL | 0 refills | Status: DC | PRN

## 2017-10-21 NOTE — Telephone Encounter (Signed)
Has been sent today

## 2017-10-27 ENCOUNTER — Other Ambulatory Visit: Payer: Self-pay | Admitting: Cardiovascular Disease

## 2017-10-27 DIAGNOSIS — Z95820 Peripheral vascular angioplasty status with implants and grafts: Secondary | ICD-10-CM

## 2017-10-27 DIAGNOSIS — I739 Peripheral vascular disease, unspecified: Secondary | ICD-10-CM

## 2017-11-01 ENCOUNTER — Ambulatory Visit (HOSPITAL_COMMUNITY)
Admission: RE | Admit: 2017-11-01 | Discharge: 2017-11-01 | Disposition: A | Discharge status: Home / Self Care | Payer: Medicare Other | Source: Ambulatory Visit | Attending: Cardiology | Admitting: Cardiology

## 2017-11-01 DIAGNOSIS — I739 Peripheral vascular disease, unspecified: Secondary | ICD-10-CM | POA: Diagnosis not present

## 2017-11-01 DIAGNOSIS — Z95820 Peripheral vascular angioplasty status with implants and grafts: Secondary | ICD-10-CM | POA: Insufficient documentation

## 2017-11-07 ENCOUNTER — Other Ambulatory Visit: Payer: Self-pay | Admitting: Endocrinology

## 2017-11-08 ENCOUNTER — Other Ambulatory Visit: Payer: Self-pay | Admitting: *Deleted

## 2017-11-08 DIAGNOSIS — I739 Peripheral vascular disease, unspecified: Secondary | ICD-10-CM

## 2017-11-13 ENCOUNTER — Other Ambulatory Visit: Payer: Self-pay | Admitting: Endocrinology

## 2017-11-21 ENCOUNTER — Telehealth: Payer: Self-pay | Admitting: Endocrinology

## 2017-11-21 NOTE — Telephone Encounter (Signed)
Pt needs hydocodone ordered please

## 2017-11-22 ENCOUNTER — Other Ambulatory Visit: Payer: Self-pay | Admitting: Endocrinology

## 2017-11-22 MED ORDER — HYDROCODONE-ACETAMINOPHEN 5-325 MG PO TABS: 1.0000 | ORAL_TABLET | Freq: Two times a day (BID) | ORAL | 0 refills | Status: DC | PRN

## 2017-11-22 NOTE — Telephone Encounter (Signed)
Please order

## 2017-12-07 ENCOUNTER — Encounter: Payer: Self-pay | Admitting: Podiatry

## 2017-12-07 ENCOUNTER — Ambulatory Visit (INDEPENDENT_AMBULATORY_CARE_PROVIDER_SITE_OTHER): Payer: Medicare Other | Admitting: Podiatry

## 2017-12-07 DIAGNOSIS — M79675 Pain in left toe(s): Secondary | ICD-10-CM

## 2017-12-07 DIAGNOSIS — M79674 Pain in right toe(s): Secondary | ICD-10-CM

## 2017-12-07 DIAGNOSIS — B351 Tinea unguium: Secondary | ICD-10-CM

## 2017-12-07 DIAGNOSIS — E1151 Type 2 diabetes mellitus with diabetic peripheral angiopathy without gangrene: Secondary | ICD-10-CM | POA: Diagnosis not present

## 2017-12-07 NOTE — Progress Notes (Signed)
Complaint:  Visit Type: Patient returns to my office for continued preventative foot care services. Complaint: Patient states" my nails have grown long and thick and become painful to walk and wear shoes" Patient has been diagnosed with DM The patient presents for preventative foot care services. No changes to ROS.  She says the callus under her right big toe joint is  pain-free.  Podiatric Exam: Vascular: dorsalis pedis and posterior tibial pulses are diminished   bilateral. Capillary return is immediate. Temperature gradient is WNL. Skin turgor WNL  Sensorium: Diminished Semmes Weinstein monofilament test. Normal tactile sensation bilaterally. Nail Exam: Pt has thick disfigured discolored nails with subungual debris noted bilateral entire nail hallux through fifth toenails Ulcer Exam: There is no evidence of ulcer or pre-ulcerative changes or infection. Orthopedic Exam: Muscle tone and strength are WNL. No limitations in general ROM. No crepitus or effusions noted. Foot type and digits show no abnormalities. Bony prominences are unremarkable. Skin: No Porokeratosis. No infection or ulcers  Diagnosis:  Onychomycosis, , Pain in right toe, pain in left toes  Treatment & Plan Procedures and Treatment: Consent by patient was obtained for treatment procedures. The patient understood the discussion of treatment and procedures well. All questions were answered thoroughly reviewed. Debridement of mycotic and hypertrophic toenails, 1 through 5 bilateral and clearing of subungual debris. No ulceration, no infection noted.   Return Visit-Office Procedure: Patient instructed to return to the office for a follow up visit 3 months for continued evaluation and treatment.    Gardiner Barefoot DPM

## 2017-12-08 ENCOUNTER — Other Ambulatory Visit: Payer: Self-pay | Admitting: Endocrinology

## 2017-12-20 ENCOUNTER — Encounter: Payer: Self-pay | Admitting: Family Medicine

## 2017-12-20 DIAGNOSIS — H40053 Ocular hypertension, bilateral: Secondary | ICD-10-CM | POA: Diagnosis not present

## 2017-12-20 DIAGNOSIS — H35373 Puckering of macula, bilateral: Secondary | ICD-10-CM | POA: Diagnosis not present

## 2017-12-20 DIAGNOSIS — E119 Type 2 diabetes mellitus without complications: Secondary | ICD-10-CM | POA: Diagnosis not present

## 2017-12-20 LAB — HM DIABETES EYE EXAM

## 2017-12-22 ENCOUNTER — Telehealth: Payer: Self-pay | Admitting: Endocrinology

## 2017-12-22 ENCOUNTER — Other Ambulatory Visit: Payer: Self-pay | Admitting: Endocrinology

## 2017-12-22 MED ORDER — HYDROCODONE-ACETAMINOPHEN 5-325 MG PO TABS: 1.0000 | ORAL_TABLET | Freq: Two times a day (BID) | ORAL | 0 refills | Status: DC | PRN

## 2017-12-22 NOTE — Telephone Encounter (Signed)
Patient is calling to her her Hydrocodone refilled.  She uses [facility]

## 2017-12-24 ENCOUNTER — Other Ambulatory Visit: Payer: Self-pay | Admitting: Endocrinology

## 2018-01-12 ENCOUNTER — Other Ambulatory Visit (INDEPENDENT_AMBULATORY_CARE_PROVIDER_SITE_OTHER): Payer: Medicare Other

## 2018-01-12 DIAGNOSIS — E063 Autoimmune thyroiditis: Secondary | ICD-10-CM | POA: Diagnosis not present

## 2018-01-12 DIAGNOSIS — E119 Type 2 diabetes mellitus without complications: Secondary | ICD-10-CM

## 2018-01-12 LAB — MICROALBUMIN / CREATININE URINE RATIO
Creatinine,U: 46.9 mg/dL
Microalb Creat Ratio: 39.5 mg/g — ABNORMAL HIGH (ref 0.0–30.0)
Microalb, Ur: 18.6 mg/dL — ABNORMAL HIGH (ref 0.0–1.9)

## 2018-01-12 LAB — COMPREHENSIVE METABOLIC PANEL
ALBUMIN: 3.6 g/dL (ref 3.5–5.2)
ALT: 9 U/L (ref 0–35)
AST: 13 U/L (ref 0–37)
Alkaline Phosphatase: 59 U/L (ref 39–117)
BUN: 40 mg/dL — ABNORMAL HIGH (ref 6–23)
CALCIUM: 9.4 mg/dL (ref 8.4–10.5)
CHLORIDE: 108 meq/L (ref 96–112)
CO2: 23 meq/L (ref 19–32)
Creatinine, Ser: 2.04 mg/dL — ABNORMAL HIGH (ref 0.40–1.20)
GFR: 25.11 mL/min — ABNORMAL LOW (ref >60.00)
Glucose, Bld: 101 mg/dL — ABNORMAL HIGH (ref 70–99)
POTASSIUM: 5.1 meq/L (ref 3.5–5.1)
Sodium: 141 mEq/L (ref 135–145)
Total Bilirubin: 0.3 mg/dL (ref 0.2–1.2)
Total Protein: 6.5 g/dL (ref 6.0–8.3)

## 2018-01-12 LAB — T4, FREE: Free T4: 0.96 ng/dL (ref 0.60–1.60)

## 2018-01-12 LAB — HEMOGLOBIN A1C: HEMOGLOBIN A1C: 5.8 % (ref 4.6–6.5)

## 2018-01-12 LAB — TSH: TSH: 1.4 u[IU]/mL (ref 0.35–4.50)

## 2018-01-15 ENCOUNTER — Other Ambulatory Visit: Payer: Self-pay | Admitting: Endocrinology

## 2018-01-16 ENCOUNTER — Encounter: Payer: Self-pay | Admitting: Endocrinology

## 2018-01-16 ENCOUNTER — Ambulatory Visit (INDEPENDENT_AMBULATORY_CARE_PROVIDER_SITE_OTHER): Payer: Medicare Other | Admitting: Endocrinology

## 2018-01-16 VITALS — BP 124/68 | HR 74 | Ht 65.5 in | Wt 139.0 lb

## 2018-01-16 DIAGNOSIS — E119 Type 2 diabetes mellitus without complications: Secondary | ICD-10-CM | POA: Diagnosis not present

## 2018-01-16 DIAGNOSIS — I6523 Occlusion and stenosis of bilateral carotid arteries: Secondary | ICD-10-CM | POA: Diagnosis not present

## 2018-01-16 DIAGNOSIS — G459 Transient cerebral ischemic attack, unspecified: Secondary | ICD-10-CM

## 2018-01-16 DIAGNOSIS — N183 Chronic kidney disease, stage 3 unspecified: Secondary | ICD-10-CM

## 2018-01-16 DIAGNOSIS — Z23 Encounter for immunization: Secondary | ICD-10-CM | POA: Diagnosis not present

## 2018-01-16 DIAGNOSIS — E782 Mixed hyperlipidemia: Secondary | ICD-10-CM

## 2018-01-16 MED ORDER — CLOPIDOGREL BISULFATE 75 MG PO TABS: 75.0000 mg | ORAL_TABLET | Freq: Every day | ORAL | 3 refills | Status: DC

## 2018-01-16 NOTE — Patient Instructions (Signed)
Take omeperazole as needed

## 2018-01-16 NOTE — Progress Notes (Signed)
Patient ID: [name] [name], female   DOB: May 12, 1940, 77 y.o.   MRN: [id number]   Reason for Appointment: Follow-up of various problems  History of Present Illness   NUMBNESS right arm  She apparently had an episode where her right hand felt numb and her arm was somewhat heavy and difficult to coordinate with although was still able to move it.  This started after she had fallen asleep in the chair in the evening but did not get better till the next morning She had no other associated symptoms, weakness or numbness or headache She did not report this until today  She does have up to 59% stenosis on the left carotid artery, right internal carotid artery is occluded as of the studies from April done by cardiologist She is only taking 81 mg aspirin  RENAL dysfunction/HYPERKALEMIA:  Renal function is variable and this time it has worsened slightly Occasionally her higher creatinine may be related to hydronephrosis but she did have a stent placed in August She does have a mild stable increase in microalbumin but no gross proteinuria  Potassium is upper normal again She has been told to cut back on high potassium foods and given a list of high potassium foods She thinks she is watching her potassium in the diet   Lab Results  Component Value Date   CREATININE 2.04 (H) 01/12/2018   CREATININE 1.78 (H) 10/03/2017   CREATININE 1.68 (H) 08/10/2017   CREATININE 1.74 (H) 04/04/2017   Lab Results  Component Value Date   K 5.1 01/12/2018      Type 2 DIABETES mellitus, date of diagnosis: 1986.   The insulin regimen is: Levemir 10 units hs, Novolog 3-4 ac twice a day   Type 2 diabetes  has been treated in the last few years with low dose basal bolus insulin regimen. She has not been taking  any oral hypoglycemic drugs   A1c is again near normal at 5.9, previous range 5.4-6.3 and not after meals   Current management and problems:  She has been on a very stable dose of  insulin with low doses especially mealtime dose  Blood sugars are excellent throughout the day with only rare blood sugars over 150 after meals  Fasting readings are good without overnight hypoglycemia  She does check her sugars at various times after lunch or dinner  Because of various aches and pains and fatigue she does not exercise much  Weight is stable  Side effects from medications: Diarrhea from metformin and nausea and vomiting from GLP-1 drugs   Monitors blood glucose: Less than 1 times a day  Glucometer: One Touch.   Blood sugar readings by DOWNLOAD:  FASTING blood sugars Range 100-144, average 120  Nonfasting after 5-6 PM = 106-165 with average about 125  MEDIAN overall 123  Meals: she is usually eating low fat meals especially recently meals usually at 11 AM,and supper at 5 pm.   Calorie intake: Usually controlled. Moderate Carbs Physical activity: exercise: Minimal  Dietician visit: Most recent:, 5/13.   Wt Readings from Last 3 Encounters:  01/16/18 139 lb (63 kg)  10/12/17 138 lb (62.6 kg)  10/06/17 137 lb (62.1 kg)    Lab Results  Component Value Date   HGBA1C 5.8 01/12/2018   HGBA1C 5.9 08/10/2017   HGBA1C 5.4 03/15/2017   Lab Results  Component Value Date   MICROALBUR 18.6 (H) 01/12/2018   LDLCALC 60 05/21/2016   CREATININE 2.04 (H) 01/12/2018  ANEMIA:  Has had iron deficiency, taking iron supplement, last hemoglobin normal   Lab Results  Component Value Date   WBC 9.8 10/03/2017   HGB 12.1 10/03/2017   HCT 37.9 10/03/2017   MCV 95.2 10/03/2017   PLT 246 10/03/2017      HYPOTHYROIDISM: She has had long-standing primary hypothyroidism She is taking Levothyroxine once daily, currently 125 mcg  She takes levothyroxine consistently in the morning and is taking the iron in the evening No complaints of fatigue  TSH is now consistently normal  Lab Results  Component Value Date   TSH 1.40 01/12/2018   TSH 1.03 10/04/2017   TSH  43.69 (H) 08/10/2017   FREET4 0.96 01/12/2018   FREET4 1.44 10/04/2017   FREET4 0.84 08/10/2017    OTHER active problems: See review of systems    Allergies as of 01/16/2018   No Known Allergies     Medication List        Accurate as of 01/16/18  1:36 PM. Always use your most recent med list.          acetaminophen 650 MG CR tablet Commonly known as:  TYLENOL Take 650-1,300 mg by mouth every 8 (eight) hours as needed for pain.   albuterol 108 (90 Base) MCG/ACT inhaler Commonly known as:  PROVENTIL HFA;VENTOLIN HFA Inhale 2 puffs every 6 (six) hours as needed into the lungs for wheezing or shortness of breath.   aspirin 81 MG chewable tablet Chew 81 mg by mouth daily.   calcium-vitamin D 500-200 MG-UNIT tablet Commonly known as:  OSCAL WITH D Take 1 tablet by mouth every morning.   ferrous sulfate 325 (65 FE) MG tablet Take 325 mg by mouth every evening.   glucose blood test strip Use to test blood sugar 1-2 times daily Dx code- E11.9   HYDROcodone-acetaminophen 5-325 MG tablet Commonly known as:  NORCO/VICODIN Take 1 tablet by mouth 2 (two) times daily as needed for moderate pain or severe pain.   IMODIUM A-D 2 MG tablet Generic drug:  loperamide Take 2 mg by mouth as needed for diarrhea or loose stools.   Insulin Pen Needle 32G X 4 MM Misc Use 4 times daily   isosorbide mononitrate 30 MG 24 hr tablet Commonly known as:  IMDUR TAKE 1 TABLET(30 MG) BY MOUTH DAILY   latanoprost 0.005 % ophthalmic solution Commonly known as:  XALATAN Place 1 drop into both eyes at bedtime.   LEVEMIR 100 UNIT/ML injection Generic drug:  insulin detemir Inject 10 Units into the skin at bedtime.   levothyroxine 125 MCG tablet Commonly known as:  SYNTHROID, LEVOTHROID TAKE 1 TABLET(125 MCG) BY MOUTH DAILY   lidocaine 5 % Commonly known as:  LIDODERM Place 1 patch onto the skin daily. Remove & Discard patch within 12 hours or as directed by MD   metoprolol succinate  25 MG 24 hr tablet Commonly known as:  TOPROL-XL TAKE 1 TABLET DAILY   NOVOLOG FLEXPEN 100 UNIT/ML FlexPen Generic drug:  insulin aspart INJECT 8 UNITS 3 TIMES A   DAY WITH MEALS   omeprazole 20 MG capsule Commonly known as:  PRILOSEC TAKE 1 CAPSULE DAILY   polyethylene glycol packet Commonly known as:  MIRALAX / GLYCOLAX Take 17 g by mouth daily as needed for moderate constipation.   rosuvastatin 20 MG tablet Commonly known as:  CRESTOR TAKE 1 TABLET DAILY   vitamin B-12 1000 MCG tablet Commonly known as:  CYANOCOBALAMIN Take 1,000 mcg by mouth daily.  vitamin C 500 MG tablet Commonly known as:  ASCORBIC ACID Take 500 mg by mouth at bedtime.   Vitamin D (Ergocalciferol) 1.25 MG (50000 UT) Caps capsule Commonly known as:  DRISDOL TAKE 1 CAPSULE BY MOUTH EVERY 7 DAYS(ON THURSDAY)       Allergies: No Known Allergies  Past Medical History:  Diagnosis Date  . AAA (abdominal aortic aneurysm) ([place])   . Bilateral hydronephrosis   . Carotid artery stenosis    carotid doppler 06/2012 - Right CCA/Bulb/ICA with chronic occlusion; L vertebral artery with abnormal blood flow; L Bulb/Prox ICA  s/p endarterectomy with mild fibrous plaque, 50% diameter reduction  . CHF (congestive heart failure) ([place])   . Choledocholithiasis 2017  . CKD (chronic kidney disease), stage III ([place])   . Coronary artery disease due to lipid rich plaque cardiologist-  dr berry   s/p CABG x6 1997-- cath 12-09-2009 occluded vein to obtuse marginal branch and ramus branch with patent vien to PDA and patent LIMA to LAD, ef 40%-- Myoview 11-24-2011, nonischemic  . Diverticulosis   . Dyspnea on exertion   . GERD (gastroesophageal reflux disease)   . GI bleed   . History of sepsis    10-18-2014 w/ acute pyelonephritis  . Hyperlipidemia   . Hypertension   . Hypothyroidism   . Ischemic cardiomyopathy    03-25-2010-- per lasts echo EF  50-55%  . PAD (peripheral artery disease) ([place])    09/2010 LEAs - R ABI  of 0.45, occluded fem-pop bypass graft, L ABI of 0.59 with occluded SFA; severe arterial insuff  . PVD (peripheral vascular disease) with claudication ([place])    last duplex 07-04-2015 -- Right CCA and ICA chronic occlusion, 21-30% LICA, Patent vertebral arteries w/ antegrade flow, bilateral normal subclavian arteries   . Retroperitoneal fibrosis   . Tubular adenoma of colon   . Type 2 diabetes mellitus ([place])    monitored by dr Dwyane Dee    Past Surgical History:  Procedure Laterality Date  . AORTA - BILATERAL FEMORAL ARTERY BYPASS GRAFT  1997   and RIGHT FEM-POP   . CARDIAC CATHETERIZATION  12-09-2009  dr al little   EF >40%-- occluded vein to OM & ramus branches, patent vein to PDA, patent LIMA to LAD (Dr. [name], [facility]) - later had thrombectomy of R fem-pop bypass ad R common femoral & profunda femoris artery (Dr. Oneida Alar)  . CARDIOVASCULAR STRESS TEST  11-24-2011   dr berry   Low Risk study: fixed basal to mid inferior attenuation artifact, no reversible ischemia,  normal LV function and wall motion , ef 67%  . CAROTID ENDARTERECTOMY Bilateral right 1994//  left ?  Marland Kitchen CATARACT EXTRACTION W/ INTRAOCULAR LENS  IMPLANT, BILATERAL  2006  . CHOLECYSTECTOMY N/A 09/05/2015   Procedure: ATTEMPTED LAPAROSCOPIC CHOLECYSTECTOMY, EXPLORATORY LAPAROTOMY WITH CHOLECYSTECTOMY;  Surgeon: Autumn Messing III, MD;  Location: [facility];  Service: General;  Laterality: N/A;  . COLONOSCOPY    . COLONOSCOPY  10/21/2015  . COLONOSCOPY WITH PROPOFOL N/A 10/21/2015   Procedure: COLONOSCOPY WITH PROPOFOL;  Surgeon: Milus Banister, MD;  Location: [facility];  Service: Endoscopy;  Laterality: N/A;  . CORONARY ARTERY BYPASS GRAFT  1997   x6; internal mammary to LAD, SVG to ramus #1 & #2, SVG to OM, SVG to PDA,   . CYSTOSCOPY W/ RETROGRADES Right 08/01/2015   Procedure: CYSTOSCOPY WITH RETROGRADE PYELOGRAM;  Surgeon: Ardis Hughs, MD;  Location: [facility];  Service: Urology;  Laterality: Right;  . CYSTOSCOPY  W/ URETERAL STENT PLACEMENT  03/10/2012   Procedure: CYSTOSCOPY WITH RETROGRADE PYELOGRAM/URETERAL STENT PLACEMENT;  Surgeon: Hanley Ben, MD;  Location: [facility];  Service: Urology;  Laterality: Left;  . CYSTOSCOPY W/ URETERAL STENT PLACEMENT Bilateral 03/07/2015   Procedure: BILATERAL RETROGRADE PYELOGRAM AND RIGHT URETERAL STENT PLACEMENT;  Surgeon: Ardis Hughs, MD;  Location: [facility];  Service: Urology;  Laterality: Bilateral;  . CYSTOSCOPY W/ URETERAL STENT PLACEMENT Right 08/01/2015   Procedure: CYSTOSCOPY WITH STENT REPLACEMENT;  Surgeon: Ardis Hughs, MD;  Location: [facility];  Service: Urology;  Laterality: Right;  . CYSTOSCOPY W/ URETERAL STENT PLACEMENT Right 02/13/2016   Procedure: RIGHT URETERAL STENT EXCHANGE;  Surgeon: Ardis Hughs, MD;  Location: [facility];  Service: Urology;  Laterality: Right;  . CYSTOSCOPY W/ URETERAL STENT PLACEMENT Right 09/17/2016   Procedure: CYSTOSCOPY WITH RIGHT  RETROGRADE PYELOGRAM RIGHT URETERAL STENT EXCHANGE;  Surgeon: Ardis Hughs, MD;  Location: [facility];  Service: Urology;  Laterality: Right;  . CYSTOSCOPY W/ URETERAL STENT PLACEMENT Bilateral 03/18/2017   Procedure: CYSTOSCOPY WITH BILATERAL  RETROGRADE PYELOGRAM RIGHT URETERAL STENT [place];  Surgeon: Ardis Hughs, MD;  Location: [facility];  Service: Urology;  Laterality: Bilateral;  . CYSTOSCOPY WITH STENT PLACEMENT Right 10/06/2017   Procedure: CYSTOSCOPY, RETROGRADE  WITH RIGHT STENT EXCHANGE;  Surgeon: Ardis Hughs, MD;  Location: [facility];  Service: Urology;  Laterality: Right;  . ERCP N/A 09/03/2015   Procedure: ENDOSCOPIC RETROGRADE CHOLANGIOPANCREATOGRAPHY (ERCP);  Surgeon: Irene Shipper, MD;  Location: [facility]COPY;  Service: Endoscopy;  Laterality: N/A;  . ESOPHAGOGASTRODUODENOSCOPY    . ESOPHAGOGASTRODUODENOSCOPY N/A 10/19/2015   Procedure: ESOPHAGOGASTRODUODENOSCOPY (EGD);  Surgeon: Gatha Mayer, MD;  Location: [facility]  ENDOSCOPY;  Service: Endoscopy;  Laterality: N/A;  . GIVENS CAPSULE STUDY  10/21/2015  . GIVENS CAPSULE STUDY N/A 10/21/2015   Procedure: GIVENS CAPSULE STUDY;  Surgeon: Milus Banister, MD;  Location: [place];  Service: Endoscopy;  Laterality: N/A;  . REPAIR RIGHT FEMORAL PSEUDOANEUYSM/  RIGHT FEM-POP BYPASS GRAFT/  DEBRIDEMENT RIGHT LOWER EXTREMITIY VENOUS STATUS ULCERS X2  01-12-2005  . TOTAL ABDOMINAL HYSTERECTOMY W/ BILATERAL SALPINGOOPHORECTOMY  1986  . TRANSTHORACIC ECHOCARDIOGRAM  03/25/2010   EF 76-81%, LV systolic function low normal with mild inferoseptal hypocontractility; LA mildly dilated; mod MR; mild TR, RV systolic pressure elevated, mild pulm HTN; AV mildly sclerotic; mild pulm valve regurg; aortic root sclerosis/calcif     Family History  Problem Relation Age of Onset  . Congestive Heart Failure Mother   . Diabetes Mother   . Stroke Father   . Cancer Maternal Aunt        Breast cancer    Social History:  reports that she quit smoking about 8 years ago. She has a 60.00 pack-year smoking history. She has never used smokeless tobacco. She reports that she drinks about 3.0 standard drinks of alcohol per week. She reports that she does not use drugs.  Review of Systems -     Hyperlipidemia: Has history of high LDL and triglycerides/low HDL Last LDL below 70 with Crestor 20 mg  Usually has high triglycerides, under 250   Lab Results  Component Value Date   CHOL 121 10/04/2017   HDL 29.70 (L) 10/04/2017   LDLCALC 60 05/21/2016   LDLDIRECT 59.0 10/04/2017   TRIG 221.0 (H) 10/04/2017   CHOLHDL 4 10/04/2017     Vitamin D deficiency: She has been on supplements and taking her 50,000 units every other week  History of CHF: No hospitalizations since 2017 This has been followed by cardiologist She has a low ejection fraction of 20-25 %  Currently on metoprolol and Lasix as needed Also she had atrial flutter in 2018  No recent symptoms of  dyspnea   Examination:   BP 124/68   Pulse 74   Ht 5' 5.5" (1.664 m)   Wt 139 lb (63 kg)   SpO2 95%   BMI 22.78 kg/m   Body mass index is 22.78 kg/m.    She looks well Bilateral carotid bruits heard  Heart sounds normal with regular rhythm  No edema  Right hand grip normal   Assesment/PLAN:    RIGHT arm numbness episode: She may have had a TIA, discussed that it is unlikely to be from cervical disc protrusion since it only lasted overnight and not associated with any unusual activity She does have carotid stenosis  Recommendations: Recheck carotid Doppler studies She will follow-up with Dr. 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 along with some history of hydronephrosis:   Creatinine is slightly higher now  No recent problems with obstructive uropathy and has had her ureteral stents replaced every 6 months including in August She is not having any issues with blood pressure being low or dehydration and creatinine will need to be rechecked in a month If consistently higher will refer her back to urologist  HYPERKALEMIA: Mild and stable, 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 potassium is upper normal recently Needs to be on a low potassium diet  ANEMIA: We will recheck on the next visit, last hemoglobin normal  DIABETES type II on low-dose basal bolus insulin program:  She has only occasional higher readings in the morning but discussed need to check more readings after meals A1c is usually upper normal She will continue the same regimen Needs to have some protein with every meal  HYPOTHYROIDISM:   Well-controlled on stable with 125 mcg levothyroxine  Chronic low back pain: She is taking hydrocodone as needed and will continue this as needed, prescription will be due on  01/22/2018  History of CHF: No recurrence No recurrence of atrial fibrillation  Influenza vaccine given  Total visit time for evaluation and management of multiple problems and counseling =25 minutes   There are no Patient Instructions on file for this visit.    [name]  01/16/18    Note: This office note was prepared with Dragon voice recognition system technology. Any transcriptional errors that result from this process are unintentional.    -

## 2018-01-19 DIAGNOSIS — N390 Urinary tract infection, site not specified: Secondary | ICD-10-CM | POA: Diagnosis not present

## 2018-01-19 DIAGNOSIS — N135 Crossing vessel and stricture of ureter without hydronephrosis: Secondary | ICD-10-CM | POA: Diagnosis not present

## 2018-01-19 DIAGNOSIS — R3912 Poor urinary stream: Secondary | ICD-10-CM | POA: Diagnosis not present

## 2018-01-20 ENCOUNTER — Telehealth: Payer: Self-pay | Admitting: Cardiovascular Disease

## 2018-01-20 DIAGNOSIS — I739 Peripheral vascular disease, unspecified: Principal | ICD-10-CM

## 2018-01-20 DIAGNOSIS — I779 Disorder of arteries and arterioles, unspecified: Secondary | ICD-10-CM

## 2018-01-20 NOTE — Telephone Encounter (Signed)
Returned call to pt she states that she had an appt with Dr Dwyane Dee and he had his handheld [place] machine and states that she has more carotid blockages and should call and let us know and schedule carotid doppler. She has an appt 12-18 and would like to know if we can schedule it before this so he can go over it with her at that appt. I see that her last carotid was 2016.

## 2018-01-20 NOTE — Telephone Encounter (Signed)
Per pt call stated she saw her pcp Dr Dwyane Dee Monday and he thinks there are block ages on the left side of her neck she would like a call back to speak to someone about the next steps.

## 2018-01-22 ENCOUNTER — Other Ambulatory Visit: Payer: Self-pay | Admitting: Endocrinology

## 2018-01-23 ENCOUNTER — Telehealth: Payer: Self-pay | Admitting: Endocrinology

## 2018-01-23 ENCOUNTER — Other Ambulatory Visit: Payer: Self-pay | Admitting: Endocrinology

## 2018-01-23 MED ORDER — HYDROCODONE-ACETAMINOPHEN 5-325 MG PO TABS: 1.0000 | ORAL_TABLET | Freq: Two times a day (BID) | ORAL | 0 refills | Status: DC | PRN

## 2018-01-23 NOTE — Telephone Encounter (Signed)
Patient has been advised

## 2018-01-23 NOTE — Telephone Encounter (Signed)
SPOKE TO PATIENT - SHE IS AWARE  DR BERRY WILL NOT FILL PAIN MEDICATION .  THIS ENCOUNTER OPENED ON INCORRECT MESSAGE.

## 2018-01-23 NOTE — Telephone Encounter (Signed)
Please refill if appropriate

## 2018-01-23 NOTE — Telephone Encounter (Signed)
Left message for pt to call to schedule carotid dopplers. Order placed.

## 2018-01-23 NOTE — Telephone Encounter (Signed)
Per [facility] "Caller says that the pharmacy has not received her hydrocodone refill. Please call it in. She ran out."

## 2018-01-23 NOTE — Telephone Encounter (Signed)
Refill has been sent, she should take it only as needed: Hydrocodone prescription

## 2018-01-23 NOTE — Telephone Encounter (Signed)
Follow Up : ° ° °Patient returning call back. °

## 2018-01-23 NOTE — Telephone Encounter (Signed)
Patient just requested refill today have sent to MD which is who has to fill it

## 2018-01-23 NOTE — Telephone Encounter (Signed)
Patient requests RX for Hydrocodone be sent to [facility] on [address] asap. Patient is going out of town tomorrow morning.

## 2018-01-23 NOTE — Telephone Encounter (Signed)
Patient requests a RX for Hydrocodone pain medication. Patient is going out of town tomorrow morning and is requesting to pick up medication today at [facility] on [address]

## 2018-02-06 ENCOUNTER — Other Ambulatory Visit: Payer: Self-pay | Admitting: Cardiovascular Disease

## 2018-02-06 ENCOUNTER — Ambulatory Visit (HOSPITAL_COMMUNITY)
Admission: RE | Admit: 2018-02-06 | Discharge: 2018-02-06 | Disposition: A | Discharge status: Home / Self Care | Payer: Medicare Other | Source: Ambulatory Visit | Attending: Cardiology | Admitting: Cardiology

## 2018-02-06 ENCOUNTER — Other Ambulatory Visit: Payer: Self-pay | Admitting: *Deleted

## 2018-02-06 DIAGNOSIS — I739 Peripheral vascular disease, unspecified: Secondary | ICD-10-CM | POA: Diagnosis not present

## 2018-02-06 DIAGNOSIS — I6523 Occlusion and stenosis of bilateral carotid arteries: Secondary | ICD-10-CM

## 2018-02-06 DIAGNOSIS — I779 Disorder of arteries and arterioles, unspecified: Secondary | ICD-10-CM

## 2018-02-09 ENCOUNTER — Other Ambulatory Visit: Payer: Self-pay | Admitting: Endocrinology

## 2018-02-12 ENCOUNTER — Other Ambulatory Visit: Payer: Self-pay | Admitting: Endocrinology

## 2018-02-22 ENCOUNTER — Ambulatory Visit (INDEPENDENT_AMBULATORY_CARE_PROVIDER_SITE_OTHER): Payer: Medicare Other | Admitting: Cardiovascular Disease

## 2018-02-22 ENCOUNTER — Encounter: Payer: Self-pay | Admitting: Cardiovascular Disease

## 2018-02-22 VITALS — BP 134/62 | HR 64 | Ht 65.5 in | Wt 136.0 lb

## 2018-02-22 DIAGNOSIS — I6523 Occlusion and stenosis of bilateral carotid arteries: Secondary | ICD-10-CM

## 2018-02-22 DIAGNOSIS — I739 Peripheral vascular disease, unspecified: Secondary | ICD-10-CM

## 2018-02-22 DIAGNOSIS — I10 Essential (primary) hypertension: Secondary | ICD-10-CM

## 2018-02-22 DIAGNOSIS — E785 Hyperlipidemia, unspecified: Secondary | ICD-10-CM

## 2018-02-22 NOTE — Patient Instructions (Signed)
Medication Instructions:  Your physician recommends that you continue on your current medications as directed. Please refer to the Current Medication list given to you today. If you need a refill on your cardiac medications before your next appointment, please call your pharmacy.   Lab work: none If you have labs (blood work) drawn today and your tests are completely normal, you will receive your results only by: Marland Kitchen MyChart Message (if you have MyChart) OR . A paper copy in the mail If you have any lab test that is abnormal or we need to change your treatment, we will call you to review the results.  Testing/Procedures: Your physician has requested that you have a lower or upper extremity arterial duplex. This test is an ultrasound of the arteries in the legs or arms. It looks at arterial blood flow in the legs and arms. Allow one hour for Lower and Upper Arterial scans. There are no restrictions or special instructions  Your physician has requested that you have an ankle brachial index (ABI). During this test an ultrasound and blood pressure cuff are used to evaluate the arteries that supply the arms and legs with blood. Allow thirty minutes for this exam. There are no restrictions or special instructions.  SCHEDULE AUGUST 2020  Your physician has requested that you have a carotid duplex. This test is an ultrasound of the carotid arteries in your neck. It looks at blood flow through these arteries that supply the brain with blood. Allow one hour for this exam. There are no restrictions or special instructions.  SCHEDULE AUGUST 2020  Follow-Up: At [facility], you and your health needs are our priority.  As part of our continuing mission to provide you with exceptional heart care, we have created designated Provider Care Teams.  These Care Teams include your primary Cardiologist (physician) and Advanced Practice Providers (APPs -  Physician Assistants and Nurse Practitioners) who all work  together to provide you with the care you need, when you need it. You will need a follow up appointment in 12 months.  Please call our office 2 months in advance to schedule this appointment.  You may see DR. BERRY or one of the following Advanced Practice Providers on your designated Care Team:   Kerin Ransom, PA-C [place], [place] . Sande Rives, PA-C

## 2018-02-22 NOTE — Assessment & Plan Note (Signed)
History of bilateral carotid endarterectomies with recent carotid Dopplers performed 02/06/2018 revealing an occluded right carotid with moderately severe left ICA stenosis which has progressed somewhat over the last 6 months.  We will continue to follow this by duplex ultrasound.

## 2018-02-22 NOTE — Assessment & Plan Note (Signed)
History of coronary artery bypass grafting in 1997.  She had cath done by Dr. [name] 12/10/2011 revealing occluded vein to an obtuse marginal branch and ramus branch the patent vein to the PDA and a patent LIMA to the LAD.  Her EF was 40% at that time.  Her ejection fraction has ranged from the 20 to 25% range up to the 55% range back in 2017.  She currently denies shortness of breath.

## 2018-02-22 NOTE — Progress Notes (Signed)
02/22/2018 [name] [name] [facility]   [birth year]  [id number]  Primary Physician [name], MD Primary Cardiologist: [name] MD FACP, [place], [place], [place]  HPI:  [name] [name] is a 77 y.o.  mildly overweight, divorced Caucasian female, mother of 1 child who I last saw in the office  04/13/2017. She has a history of CAD status post coronary artery bypass grafting x6 in 1997. She has had bilateral carotid endarterectomies, aortobifemoral bypass grafting and right fem-pop bypass grafting with lifestyle-limiting claudication. Cath performed by Dr. [name] December 09, 2009, revealed an occluded vein to an OM and ramus branches with a patent vein to a PDA and a patent LIMA to the LAD. Her EF was 40% at that time. She quit smoking November 2011. She had an echo performed March 25, 2010, that showed normal LV systolic function and a Myoview performed November 24, 2011, which was nonischemic. She denies chest pain or shortness of breath. Her last lower extremity Dopplers performed in our office October 05, 2011, revealed a right ABI of 0.45 with an occluded fem-pop bypass graft and a left ABI of 0.59 with an occluded left SFA.since I saw her back 7 months ago she's remained clinically stable. She denies chest pain or shortness of breath. Her lower extremity arterial Dopplers show progression of disease on the right but though I do not think she is revascularizable given her comorbidities. She denies claudication. She was recently admitted with cholecystitis and underwent laparoscopic cholecystectomy. She is slowly recovering from this. A 2-D echocardiogram revealed an ejection fraction of 55% performed 08/29/15. She was hospitalized 01/23/16 with acute congestive heart failure and a flutter converted to sinus rhythm. EF at that time was 20-25%. He was elected not to anticoagulate her because of history of GI bleed nor to pursue cardiac catheterization.She had carotid Dopplers revealed occluded right carotid  with moderate left ICA stenosis and lower extremity Dopplers to reveal a right ABI 0.75 for left of 1.2 with high frequency signals in both common femoral arteries.  Since I saw her a year ago she denies chest pain, shortness of breath or claudication.  Current Meds  Medication Sig  . acetaminophen (TYLENOL) 650 MG CR tablet Take 650-1,300 mg by mouth every 8 (eight) hours as needed for pain.   Marland Kitchen albuterol (PROVENTIL HFA;VENTOLIN HFA) 108 (90 Base) MCG/ACT inhaler INHALE 2 PUFFS INTO THE LUNGS EVERY 6 HOURS AS NEEDED FOR WHEEZING OR SHORTNESS OF BREATH  . aspirin 81 MG chewable tablet Chew 81 mg by mouth daily.   . calcium-vitamin D (OSCAL WITH D) 500-200 MG-UNIT per tablet Take 1 tablet by mouth every morning.  . clopidogrel (PLAVIX) 75 MG tablet Take 1 tablet (75 mg total) by mouth daily.  . ferrous sulfate 325 (65 FE) MG tablet Take 325 mg by mouth every evening.   Marland Kitchen glucose blood (ONE TOUCH ULTRA TEST) test strip USE TO TEST BLOOD SUGAR 1 TO 2 TIMES DAILY AS DIRECTED  . HYDROcodone-acetaminophen (NORCO/VICODIN) 5-325 MG tablet Take 1 tablet by mouth 2 (two) times daily as needed for moderate pain or severe pain.  Marland Kitchen insulin detemir (LEVEMIR) 100 UNIT/ML injection Inject 10 Units into the skin at bedtime.  . Insulin Pen Needle 32G X 4 MM MISC Use 4 times daily  . isosorbide mononitrate (IMDUR) 30 MG 24 hr tablet TAKE 1 TABLET(30 MG) BY MOUTH DAILY  . latanoprost (XALATAN) 0.005 % ophthalmic solution Place 1 drop into both eyes at bedtime.  Marland Kitchen levothyroxine (SYNTHROID,  LEVOTHROID) 125 MCG tablet TAKE 1 TABLET(125 MCG) BY MOUTH DAILY  . levothyroxine (SYNTHROID, LEVOTHROID) 125 MCG tablet TAKE 1 TABLET(125 MCG) BY MOUTH DAILY  . lidocaine (LIDODERM) 5 % Place 1 patch onto the skin daily. Remove & Discard patch within 12 hours or as directed by MD (Patient taking differently: Place 1 patch onto the skin daily as needed (pain). Remove & Discard patch within 12 hours or as directed by MD)  .  loperamide (IMODIUM A-D) 2 MG tablet Take 2 mg by mouth as needed for diarrhea or loose stools.   . metoprolol succinate (TOPROL-XL) 25 MG 24 hr tablet TAKE 1 TABLET DAILY  . NOVOLOG FLEXPEN 100 UNIT/ML FlexPen INJECT 8 UNITS 3 TIMES A   DAY WITH MEALS (Patient taking differently: 3-4 units 3 times a day with meals.)  . omeprazole (PRILOSEC) 20 MG capsule TAKE 1 CAPSULE DAILY  . polyethylene glycol (MIRALAX / GLYCOLAX) packet Take 17 g by mouth daily as needed for moderate constipation.  . rosuvastatin (CRESTOR) 20 MG tablet TAKE 1 TABLET DAILY  . vitamin B-12 (CYANOCOBALAMIN) 1000 MCG tablet Take 1,000 mcg by mouth daily.  . vitamin C (ASCORBIC ACID) 500 MG tablet Take 500 mg by mouth at bedtime.   . Vitamin D, Ergocalciferol, (DRISDOL) 1.25 MG (50000 UT) CAPS capsule TAKE 1 CAPSULE BY MOUTH EVERY 7 DAYS(ON THURSDAY)     No Known Allergies  Social History   Socioeconomic History  . Marital status: Divorced    Spouse name: Not on file  . Number of children: 1  . Years of education: 22  . Highest education level: Not on file  Occupational History  . Not on file  Social Needs  . Financial resource strain: Not on file  . Food insecurity:    Worry: Not on file    Inability: Not on file  . Transportation needs:    Medical: Not on file    Non-medical: Not on file  Tobacco Use  . Smoking status: Former Smoker    Packs/day: 2.00    Years: 30.00    Pack years: 60.00    Last attempt to quit: 06/07/2009    Years since quitting: 8.7  . Smokeless tobacco: Never Used  Substance and Sexual Activity  . Alcohol use: Yes    Alcohol/week: 3.0 standard drinks    Types: 3 Standard drinks or equivalent per week    Comment: ocassional  . Drug use: No  . Sexual activity: Not on file  Lifestyle  . Physical activity:    Days per week: Not on file    Minutes per session: Not on file  . Stress: Not on file  Relationships  . Social connections:    Talks on phone: Not on file    Gets together:  Not on file    Attends religious service: Not on file    Active member of club or organization: Not on file    Attends meetings of clubs or organizations: Not on file    Relationship status: Not on file  . Intimate partner violence:    Fear of current or ex partner: Not on file    Emotionally abused: Not on file    Physically abused: Not on file    Forced sexual activity: Not on file  Other Topics Concern  . Not on file  Social History Narrative   Lives alone   Worked 32 yrs [employer]   1 daughter     Review of Systems:  General: negative for chills, fever, night sweats or weight changes.  Cardiovascular: negative for chest pain, dyspnea on exertion, edema, orthopnea, palpitations, paroxysmal nocturnal dyspnea or shortness of breath Dermatological: negative for rash Respiratory: negative for cough or wheezing Urologic: negative for hematuria Abdominal: negative for nausea, vomiting, diarrhea, bright red blood per rectum, melena, or hematemesis Neurologic: negative for visual changes, syncope, or dizziness All other systems reviewed and are otherwise negative except as noted above.    Blood pressure 134/62, pulse 64, height 5' 5.5" (1.664 m), weight 136 lb (61.7 kg).  General appearance: alert and no distress Neck: no adenopathy, no carotid bruit, no JVD, supple, symmetrical, trachea midline and thyroid not enlarged, symmetric, no tenderness/mass/nodules Lungs: clear to auscultation bilaterally Heart: regular rate and rhythm, S1, S2 normal, no murmur, click, rub or gallop Extremities: extremities normal, atraumatic, no cyanosis or edema Pulses: 2+ and symmetric Skin: Skin color, texture, turgor normal. No rashes or lesions Neurologic: Alert and oriented X 3, normal strength and tone. Normal symmetric reflexes. Normal coordination and gait  EKG sinus rhythm at 64 with evidence of LVH with repolarization changes.  I personally reviewed this EKG.  ASSESSMENT AND PLAN:     HTN (hypertension) History of essential hypertension her blood pressure measured today 134/62 continue current meds at current dosing.  Hx of CABG '97 History of coronary artery bypass grafting in 1997.  She had cath done by Dr. [name] 12/10/2011 revealing occluded vein to an obtuse marginal branch and ramus branch the patent vein to the PDA and a patent LIMA to the LAD.  Her EF was 40% at that time.  Her ejection fraction has ranged from the 20 to 25% range up to the 55% range back in 2017.  She currently denies shortness of breath.  AAA (abdominal aortic aneurysm) ([place]) History of aortobifemoral bypass grafting as well as right femoropopliteal bypass grafting.  Dopplers in the past have revealed an occluded right femoral bypass graft and an occluded left SFA.  She denies claudication.  Her most recent abdominal lower extremity ultrasound performed 11/01/2017 revealed a patent aortobifem bypass graft.  Carotid artery disease ([place]) History of bilateral carotid endarterectomies with recent carotid Dopplers performed 02/06/2018 revealing an occluded right carotid with moderately severe left ICA stenosis which has progressed somewhat over the last 6 months.  We will continue to follow this by duplex ultrasound.  Hyperlipidemia History of hyperlipidemia on statin therapy with lipid profile performed 10/04/2017 revealing total cluster 121, LDL 59 and HDL of 29.      [name] MD FACP,FACC,FAHA, [facility] [place] 02/22/2018 2:20 PM

## 2018-02-22 NOTE — Assessment & Plan Note (Signed)
History of aortobifemoral bypass grafting as well as right femoropopliteal bypass grafting.  Dopplers in the past have revealed an occluded right femoral bypass graft and an occluded left SFA.  She denies claudication.  Her most recent abdominal lower extremity ultrasound performed 11/01/2017 revealed a patent aortobifem bypass graft.

## 2018-02-22 NOTE — Assessment & Plan Note (Signed)
History of hyperlipidemia on statin therapy with lipid profile performed 10/04/2017 revealing total cluster 121, LDL 59 and HDL of 29.

## 2018-02-22 NOTE — Assessment & Plan Note (Signed)
History of essential hypertension her blood pressure measured today 134/62 continue current meds at current dosing.

## 2018-02-23 ENCOUNTER — Telehealth: Payer: Self-pay | Admitting: Endocrinology

## 2018-02-23 ENCOUNTER — Other Ambulatory Visit: Payer: Self-pay | Admitting: Endocrinology

## 2018-02-23 MED ORDER — HYDROCODONE-ACETAMINOPHEN 5-325 MG PO TABS: 1.0000 | ORAL_TABLET | Freq: Two times a day (BID) | ORAL | 0 refills | Status: DC | PRN

## 2018-02-23 NOTE — Telephone Encounter (Signed)
°  Patient stated she is needing Dr Dwyane Dee to send her pain medication to her pharmacy    HYDROcodone-acetaminophen (NORCO/VICODIN) 5-325 MG tablet      [place], [place] - [address] AT [place]

## 2018-02-23 NOTE — Telephone Encounter (Signed)
Please refill if appropriate

## 2018-02-23 NOTE — Telephone Encounter (Signed)
Prescription has been sent.

## 2018-02-24 ENCOUNTER — Other Ambulatory Visit (INDEPENDENT_AMBULATORY_CARE_PROVIDER_SITE_OTHER): Payer: Medicare Other

## 2018-02-24 DIAGNOSIS — N183 Chronic kidney disease, stage 3 unspecified: Secondary | ICD-10-CM

## 2018-02-24 LAB — BASIC METABOLIC PANEL
BUN: 41 mg/dL — ABNORMAL HIGH (ref 6–23)
CHLORIDE: 110 meq/L (ref 96–112)
CO2: 21 mEq/L (ref 19–32)
Calcium: 9.5 mg/dL (ref 8.4–10.5)
Creatinine, Ser: 1.58 mg/dL — ABNORMAL HIGH (ref 0.40–1.20)
GFR: 33.72 mL/min — ABNORMAL LOW (ref >60.00)
Glucose, Bld: 126 mg/dL — ABNORMAL HIGH (ref 70–99)
Potassium: 4.4 mEq/L (ref 3.5–5.1)
Sodium: 140 mEq/L (ref 135–145)

## 2018-03-01 DIAGNOSIS — S8990XA Unspecified injury of unspecified lower leg, initial encounter: Secondary | ICD-10-CM | POA: Diagnosis not present

## 2018-03-01 DIAGNOSIS — E1151 Type 2 diabetes mellitus with diabetic peripheral angiopathy without gangrene: Secondary | ICD-10-CM | POA: Diagnosis present

## 2018-03-01 DIAGNOSIS — S82141A Displaced bicondylar fracture of right tibia, initial encounter for closed fracture: Secondary | ICD-10-CM | POA: Diagnosis present

## 2018-03-01 DIAGNOSIS — Y998 Other external cause status: Secondary | ICD-10-CM | POA: Diagnosis not present

## 2018-03-01 DIAGNOSIS — S31000A Unspecified open wound of lower back and pelvis without penetration into retroperitoneum, initial encounter: Secondary | ICD-10-CM | POA: Diagnosis not present

## 2018-03-01 DIAGNOSIS — R52 Pain, unspecified: Secondary | ICD-10-CM | POA: Diagnosis not present

## 2018-03-01 DIAGNOSIS — M25561 Pain in right knee: Secondary | ICD-10-CM | POA: Diagnosis not present

## 2018-03-01 DIAGNOSIS — E1122 Type 2 diabetes mellitus with diabetic chronic kidney disease: Secondary | ICD-10-CM | POA: Diagnosis present

## 2018-03-01 DIAGNOSIS — Z794 Long term (current) use of insulin: Secondary | ICD-10-CM | POA: Diagnosis not present

## 2018-03-01 DIAGNOSIS — K219 Gastro-esophageal reflux disease without esophagitis: Secondary | ICD-10-CM | POA: Diagnosis present

## 2018-03-01 DIAGNOSIS — Z87891 Personal history of nicotine dependence: Secondary | ICD-10-CM | POA: Diagnosis not present

## 2018-03-01 DIAGNOSIS — S82144A Nondisplaced bicondylar fracture of right tibia, initial encounter for closed fracture: Secondary | ICD-10-CM | POA: Diagnosis not present

## 2018-03-01 DIAGNOSIS — Z79899 Other long term (current) drug therapy: Secondary | ICD-10-CM | POA: Diagnosis not present

## 2018-03-01 DIAGNOSIS — E039 Hypothyroidism, unspecified: Secondary | ICD-10-CM | POA: Diagnosis present

## 2018-03-01 DIAGNOSIS — I739 Peripheral vascular disease, unspecified: Secondary | ICD-10-CM | POA: Diagnosis present

## 2018-03-01 DIAGNOSIS — Z951 Presence of aortocoronary bypass graft: Secondary | ICD-10-CM | POA: Diagnosis not present

## 2018-03-01 DIAGNOSIS — Z90722 Acquired absence of ovaries, bilateral: Secondary | ICD-10-CM | POA: Diagnosis not present

## 2018-03-01 DIAGNOSIS — S82121D Displaced fracture of lateral condyle of right tibia, subsequent encounter for closed fracture with routine healing: Secondary | ICD-10-CM | POA: Diagnosis present

## 2018-03-01 DIAGNOSIS — N189 Chronic kidney disease, unspecified: Secondary | ICD-10-CM | POA: Diagnosis not present

## 2018-03-01 DIAGNOSIS — E119 Type 2 diabetes mellitus without complications: Secondary | ICD-10-CM | POA: Diagnosis not present

## 2018-03-01 DIAGNOSIS — N183 Chronic kidney disease, stage 3 (moderate): Secondary | ICD-10-CM | POA: Diagnosis present

## 2018-03-01 DIAGNOSIS — I7389 Other specified peripheral vascular diseases: Secondary | ICD-10-CM | POA: Diagnosis not present

## 2018-03-01 DIAGNOSIS — N179 Acute kidney failure, unspecified: Secondary | ICD-10-CM | POA: Diagnosis not present

## 2018-03-01 DIAGNOSIS — Z90710 Acquired absence of both cervix and uterus: Secondary | ICD-10-CM | POA: Diagnosis not present

## 2018-03-01 DIAGNOSIS — Z7982 Long term (current) use of aspirin: Secondary | ICD-10-CM | POA: Diagnosis not present

## 2018-03-01 DIAGNOSIS — R269 Unspecified abnormalities of gait and mobility: Secondary | ICD-10-CM | POA: Diagnosis not present

## 2018-03-01 DIAGNOSIS — I509 Heart failure, unspecified: Secondary | ICD-10-CM | POA: Diagnosis present

## 2018-03-01 DIAGNOSIS — S8991XA Unspecified injury of right lower leg, initial encounter: Secondary | ICD-10-CM | POA: Diagnosis not present

## 2018-03-01 DIAGNOSIS — X500XXA Overexertion from strenuous movement or load, initial encounter: Secondary | ICD-10-CM | POA: Diagnosis not present

## 2018-03-01 DIAGNOSIS — I129 Hypertensive chronic kidney disease with stage 1 through stage 4 chronic kidney disease, or unspecified chronic kidney disease: Secondary | ICD-10-CM | POA: Diagnosis not present

## 2018-03-01 DIAGNOSIS — M1711 Unilateral primary osteoarthritis, right knee: Secondary | ICD-10-CM | POA: Diagnosis present

## 2018-03-01 DIAGNOSIS — M11261 Other chondrocalcinosis, right knee: Secondary | ICD-10-CM | POA: Diagnosis not present

## 2018-03-01 DIAGNOSIS — Z743 Need for continuous supervision: Secondary | ICD-10-CM | POA: Diagnosis not present

## 2018-03-01 DIAGNOSIS — Y92009 Unspecified place in unspecified non-institutional (private) residence as the place of occurrence of the external cause: Secondary | ICD-10-CM | POA: Diagnosis not present

## 2018-03-01 DIAGNOSIS — E785 Hyperlipidemia, unspecified: Secondary | ICD-10-CM | POA: Diagnosis present

## 2018-03-01 DIAGNOSIS — I255 Ischemic cardiomyopathy: Secondary | ICD-10-CM | POA: Diagnosis present

## 2018-03-01 DIAGNOSIS — R279 Unspecified lack of coordination: Secondary | ICD-10-CM | POA: Diagnosis not present

## 2018-03-01 DIAGNOSIS — Z7902 Long term (current) use of antithrombotics/antiplatelets: Secondary | ICD-10-CM | POA: Diagnosis not present

## 2018-03-01 DIAGNOSIS — I70211 Atherosclerosis of native arteries of extremities with intermittent claudication, right leg: Secondary | ICD-10-CM | POA: Diagnosis not present

## 2018-03-01 DIAGNOSIS — R0789 Other chest pain: Secondary | ICD-10-CM | POA: Diagnosis not present

## 2018-03-01 DIAGNOSIS — Z9049 Acquired absence of other specified parts of digestive tract: Secondary | ICD-10-CM | POA: Diagnosis not present

## 2018-03-01 DIAGNOSIS — S82121A Displaced fracture of lateral condyle of right tibia, initial encounter for closed fracture: Secondary | ICD-10-CM | POA: Insufficient documentation

## 2018-03-01 DIAGNOSIS — I714 Abdominal aortic aneurysm, without rupture: Secondary | ICD-10-CM | POA: Diagnosis not present

## 2018-03-01 DIAGNOSIS — I13 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 1 through stage 4 chronic kidney disease, or unspecified chronic kidney disease: Secondary | ICD-10-CM | POA: Diagnosis present

## 2018-03-01 DIAGNOSIS — I4891 Unspecified atrial fibrillation: Secondary | ICD-10-CM | POA: Diagnosis present

## 2018-03-01 DIAGNOSIS — I2510 Atherosclerotic heart disease of native coronary artery without angina pectoris: Secondary | ICD-10-CM | POA: Diagnosis present

## 2018-03-01 DIAGNOSIS — I729 Aneurysm of unspecified site: Secondary | ICD-10-CM | POA: Diagnosis not present

## 2018-03-02 ENCOUNTER — Ambulatory Visit: Payer: Medicare Other | Admitting: Endocrinology

## 2018-03-04 DIAGNOSIS — I13 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 1 through stage 4 chronic kidney disease, or unspecified chronic kidney disease: Secondary | ICD-10-CM | POA: Diagnosis present

## 2018-03-04 DIAGNOSIS — E1122 Type 2 diabetes mellitus with diabetic chronic kidney disease: Secondary | ICD-10-CM | POA: Diagnosis present

## 2018-03-04 DIAGNOSIS — I7389 Other specified peripheral vascular diseases: Secondary | ICD-10-CM | POA: Diagnosis not present

## 2018-03-04 DIAGNOSIS — I5189 Other ill-defined heart diseases: Secondary | ICD-10-CM | POA: Diagnosis not present

## 2018-03-04 DIAGNOSIS — M1711 Unilateral primary osteoarthritis, right knee: Secondary | ICD-10-CM | POA: Diagnosis present

## 2018-03-04 DIAGNOSIS — I255 Ischemic cardiomyopathy: Secondary | ICD-10-CM | POA: Diagnosis present

## 2018-03-04 DIAGNOSIS — N390 Urinary tract infection, site not specified: Secondary | ICD-10-CM | POA: Diagnosis not present

## 2018-03-04 DIAGNOSIS — D508 Other iron deficiency anemias: Secondary | ICD-10-CM | POA: Diagnosis not present

## 2018-03-04 DIAGNOSIS — I4891 Unspecified atrial fibrillation: Secondary | ICD-10-CM | POA: Diagnosis present

## 2018-03-04 DIAGNOSIS — I739 Peripheral vascular disease, unspecified: Secondary | ICD-10-CM | POA: Diagnosis present

## 2018-03-04 DIAGNOSIS — N135 Crossing vessel and stricture of ureter without hydronephrosis: Secondary | ICD-10-CM | POA: Diagnosis not present

## 2018-03-04 DIAGNOSIS — I480 Paroxysmal atrial fibrillation: Secondary | ICD-10-CM | POA: Diagnosis not present

## 2018-03-04 DIAGNOSIS — E1151 Type 2 diabetes mellitus with diabetic peripheral angiopathy without gangrene: Secondary | ICD-10-CM | POA: Diagnosis present

## 2018-03-04 DIAGNOSIS — S82144A Nondisplaced bicondylar fracture of right tibia, initial encounter for closed fracture: Secondary | ICD-10-CM | POA: Diagnosis not present

## 2018-03-04 DIAGNOSIS — Z743 Need for continuous supervision: Secondary | ICD-10-CM | POA: Diagnosis not present

## 2018-03-04 DIAGNOSIS — I2510 Atherosclerotic heart disease of native coronary artery without angina pectoris: Secondary | ICD-10-CM | POA: Diagnosis present

## 2018-03-04 DIAGNOSIS — K219 Gastro-esophageal reflux disease without esophagitis: Secondary | ICD-10-CM | POA: Diagnosis not present

## 2018-03-04 DIAGNOSIS — S82201A Unspecified fracture of shaft of right tibia, initial encounter for closed fracture: Secondary | ICD-10-CM | POA: Diagnosis not present

## 2018-03-04 DIAGNOSIS — N1330 Unspecified hydronephrosis: Secondary | ICD-10-CM | POA: Diagnosis not present

## 2018-03-04 DIAGNOSIS — N183 Chronic kidney disease, stage 3 (moderate): Secondary | ICD-10-CM | POA: Diagnosis present

## 2018-03-04 DIAGNOSIS — R279 Unspecified lack of coordination: Secondary | ICD-10-CM | POA: Diagnosis not present

## 2018-03-04 DIAGNOSIS — E038 Other specified hypothyroidism: Secondary | ICD-10-CM | POA: Diagnosis not present

## 2018-03-04 DIAGNOSIS — S82121D Displaced fracture of lateral condyle of right tibia, subsequent encounter for closed fracture with routine healing: Secondary | ICD-10-CM | POA: Diagnosis present

## 2018-03-04 DIAGNOSIS — I729 Aneurysm of unspecified site: Secondary | ICD-10-CM | POA: Diagnosis not present

## 2018-03-04 DIAGNOSIS — S82141D Displaced bicondylar fracture of right tibia, subsequent encounter for closed fracture with routine healing: Secondary | ICD-10-CM | POA: Diagnosis not present

## 2018-03-04 DIAGNOSIS — E119 Type 2 diabetes mellitus without complications: Secondary | ICD-10-CM | POA: Diagnosis not present

## 2018-03-04 DIAGNOSIS — M11261 Other chondrocalcinosis, right knee: Secondary | ICD-10-CM | POA: Diagnosis not present

## 2018-03-04 MED ORDER — LEVOTHYROXINE SODIUM 125 MCG PO TABS
125.00 | ORAL_TABLET | ORAL | Status: DC
Start: 2018-03-05 — End: 2018-03-04

## 2018-03-04 MED ORDER — SODIUM CHLORIDE FLUSH 0.9 % IV SOLN
5.00 | INTRAVENOUS | Status: DC
Start: 2018-03-04 — End: 2018-03-04

## 2018-03-04 MED ORDER — SODIUM CHLORIDE FLUSH 0.9 % IV SOLN
10.00 | INTRAVENOUS | Status: DC
Start: 2018-03-04 — End: 2018-03-04

## 2018-03-04 MED ORDER — ASPIRIN 81 MG PO CHEW
81.00 | CHEWABLE_TABLET | ORAL | Status: DC
Start: 2018-03-05 — End: 2018-03-04

## 2018-03-04 MED ORDER — ISOSORBIDE MONONITRATE ER 30 MG PO TB24
30.00 | ORAL_TABLET | ORAL | Status: DC
Start: 2018-03-05 — End: 2018-03-04

## 2018-03-04 MED ORDER — ALUMINUM-MAGNESIUM-SIMETHICONE 200-200-20 MG/5ML PO SUSP
30.00 | ORAL | Status: DC
Start: ? — End: 2018-03-04

## 2018-03-04 MED ORDER — FERROUS SULFATE 325 (65 FE) MG PO TABS
650.00 | ORAL_TABLET | ORAL | Status: DC
Start: 2018-03-04 — End: 2018-03-04

## 2018-03-04 MED ORDER — PANTOPRAZOLE SODIUM 40 MG PO TBEC
40.00 | DELAYED_RELEASE_TABLET | ORAL | Status: DC
Start: 2018-03-05 — End: 2018-03-04

## 2018-03-04 MED ORDER — DOCUSATE SODIUM 100 MG PO CAPS
100.00 | ORAL_CAPSULE | ORAL | Status: DC
Start: ? — End: 2018-03-04

## 2018-03-04 MED ORDER — VITAMIN B-12 1000 MCG PO TABS
1000.00 | ORAL_TABLET | ORAL | Status: DC
Start: 2018-03-05 — End: 2018-03-04

## 2018-03-04 MED ORDER — SODIUM CHLORIDE FLUSH 0.9 % IV SOLN
10.00 | INTRAVENOUS | Status: DC
Start: ? — End: 2018-03-04

## 2018-03-04 MED ORDER — ACETAMINOPHEN 325 MG PO TABS
650.00 | ORAL_TABLET | ORAL | Status: DC
Start: ? — End: 2018-03-04

## 2018-03-04 MED ORDER — ONDANSETRON 4 MG PO TBDP
4.00 | ORAL_TABLET | ORAL | Status: DC
Start: ? — End: 2018-03-04

## 2018-03-04 MED ORDER — LIDOCAINE 4 % EX PTCH
1.00 | MEDICATED_PATCH | CUTANEOUS | Status: DC
Start: ? — End: 2018-03-04

## 2018-03-04 MED ORDER — ALBUTEROL SULFATE HFA 108 (90 BASE) MCG/ACT IN AERS
2.00 | INHALATION_SPRAY | RESPIRATORY_TRACT | Status: DC
Start: ? — End: 2018-03-04

## 2018-03-04 MED ORDER — GENERIC EXTERNAL MEDICATION
1.00 | Status: DC
Start: 2018-03-05 — End: 2018-03-04

## 2018-03-04 MED ORDER — GLUCAGON HCL RDNA (DIAGNOSTIC) 1 MG IJ SOLR
1.00 | INTRAMUSCULAR | Status: DC
Start: ? — End: 2018-03-04

## 2018-03-04 MED ORDER — HYDROCODONE-ACETAMINOPHEN 5-325 MG PO TABS
1.00 | ORAL_TABLET | ORAL | Status: DC
Start: ? — End: 2018-03-04

## 2018-03-04 MED ORDER — GLUCOSE 40 % PO GEL
15.00 | ORAL | Status: DC
Start: ? — End: 2018-03-04

## 2018-03-04 MED ORDER — SODIUM CHLORIDE FLUSH 0.9 % IV SOLN
5.00 | INTRAVENOUS | Status: DC
Start: ? — End: 2018-03-04

## 2018-03-04 MED ORDER — DEXTROSE 10 % IV SOLN
125.00 | INTRAVENOUS | Status: DC
Start: ? — End: 2018-03-04

## 2018-03-04 MED ORDER — INSULIN LISPRO 100 UNIT/ML ~~LOC~~ SOLN
2.00 | SUBCUTANEOUS | Status: DC
Start: 2018-03-04 — End: 2018-03-04

## 2018-03-04 MED ORDER — TRAMADOL HCL 50 MG PO TABS
50.00 | ORAL_TABLET | ORAL | Status: DC
Start: ? — End: 2018-03-04

## 2018-03-04 MED ORDER — ONDANSETRON HCL 4 MG/2ML IJ SOLN
4.00 | INTRAMUSCULAR | Status: DC
Start: ? — End: 2018-03-04

## 2018-03-04 MED ORDER — ATORVASTATIN CALCIUM 40 MG PO TABS
40.00 | ORAL_TABLET | ORAL | Status: DC
Start: 2018-03-04 — End: 2018-03-04

## 2018-03-04 MED ORDER — ENOXAPARIN SODIUM 30 MG/0.3ML ~~LOC~~ SOLN
30.00 | SUBCUTANEOUS | Status: DC
Start: 2018-03-04 — End: 2018-03-04

## 2018-03-04 MED ORDER — GENERIC EXTERNAL MEDICATION
500.00 | Status: DC
Start: 2018-03-04 — End: 2018-03-04

## 2018-03-04 MED ORDER — GENERIC EXTERNAL MEDICATION
1.00 | Status: DC
Start: 2018-03-04 — End: 2018-03-04

## 2018-03-04 MED ORDER — INSULIN GLARGINE 100 UNIT/ML ~~LOC~~ SOLN
10.00 | SUBCUTANEOUS | Status: DC
Start: 2018-03-04 — End: 2018-03-04

## 2018-03-06 DIAGNOSIS — I7389 Other specified peripheral vascular diseases: Secondary | ICD-10-CM | POA: Diagnosis not present

## 2018-03-06 DIAGNOSIS — I480 Paroxysmal atrial fibrillation: Secondary | ICD-10-CM | POA: Diagnosis not present

## 2018-03-06 DIAGNOSIS — S82201A Unspecified fracture of shaft of right tibia, initial encounter for closed fracture: Secondary | ICD-10-CM | POA: Diagnosis not present

## 2018-03-06 DIAGNOSIS — I5189 Other ill-defined heart diseases: Secondary | ICD-10-CM | POA: Diagnosis not present

## 2018-03-10 DIAGNOSIS — E119 Type 2 diabetes mellitus without complications: Secondary | ICD-10-CM | POA: Diagnosis not present

## 2018-03-10 DIAGNOSIS — S82201A Unspecified fracture of shaft of right tibia, initial encounter for closed fracture: Secondary | ICD-10-CM | POA: Diagnosis not present

## 2018-03-15 ENCOUNTER — Ambulatory Visit: Payer: Medicare Other | Admitting: Podiatry

## 2018-03-23 DIAGNOSIS — S82141D Displaced bicondylar fracture of right tibia, subsequent encounter for closed fracture with routine healing: Secondary | ICD-10-CM | POA: Diagnosis not present

## 2018-03-27 DIAGNOSIS — S82201A Unspecified fracture of shaft of right tibia, initial encounter for closed fracture: Secondary | ICD-10-CM | POA: Diagnosis not present

## 2018-03-27 DIAGNOSIS — N183 Chronic kidney disease, stage 3 (moderate): Secondary | ICD-10-CM | POA: Diagnosis not present

## 2018-03-27 DIAGNOSIS — I2510 Atherosclerotic heart disease of native coronary artery without angina pectoris: Secondary | ICD-10-CM | POA: Diagnosis not present

## 2018-03-27 DIAGNOSIS — D508 Other iron deficiency anemias: Secondary | ICD-10-CM | POA: Diagnosis not present

## 2018-04-12 ENCOUNTER — Telehealth: Payer: Self-pay | Admitting: Endocrinology

## 2018-04-12 DIAGNOSIS — I2510 Atherosclerotic heart disease of native coronary artery without angina pectoris: Secondary | ICD-10-CM | POA: Diagnosis not present

## 2018-04-12 DIAGNOSIS — S82201A Unspecified fracture of shaft of right tibia, initial encounter for closed fracture: Secondary | ICD-10-CM | POA: Diagnosis not present

## 2018-04-12 DIAGNOSIS — E119 Type 2 diabetes mellitus without complications: Secondary | ICD-10-CM | POA: Diagnosis not present

## 2018-04-12 DIAGNOSIS — E038 Other specified hypothyroidism: Secondary | ICD-10-CM | POA: Diagnosis not present

## 2018-04-12 DIAGNOSIS — N135 Crossing vessel and stricture of ureter without hydronephrosis: Secondary | ICD-10-CM | POA: Diagnosis not present

## 2018-04-12 DIAGNOSIS — N183 Chronic kidney disease, stage 3 (moderate): Secondary | ICD-10-CM | POA: Diagnosis not present

## 2018-04-12 DIAGNOSIS — D508 Other iron deficiency anemias: Secondary | ICD-10-CM | POA: Diagnosis not present

## 2018-04-12 DIAGNOSIS — I480 Paroxysmal atrial fibrillation: Secondary | ICD-10-CM | POA: Diagnosis not present

## 2018-04-12 DIAGNOSIS — K219 Gastro-esophageal reflux disease without esophagitis: Secondary | ICD-10-CM | POA: Diagnosis not present

## 2018-04-12 NOTE — Telephone Encounter (Signed)
Patients daughter has called stating that the patient is going to be let out of rehab soon. She states the rehab had called [place] verifying Dr. Dwyane Dee being her PCP and we said he was no longer a PCP. I apologized and told her it was total our mistake because it is out of our habit to state our doctors are not PCP's.  Please call [phone] to inform rehab that Dr.Kumar is her PCP so she can get the correct care she needs.  Also, please call patients daughter [name]) to inform about what is going on. Due to this mistake it has caused the patient to not get home care, a walker, a portable toliet, etc.  Please Advise, thanks

## 2018-04-12 NOTE — Telephone Encounter (Signed)
Called the socail worker for [employer] at the phone number listed below and left a detailed voicemail on secure line explaining that Dr. Dwyane Dee is still in fact the pt's PCP. Also called pt's daughter and informed her of this as well. Pt's daughter verbalized understanding and thanked me for my call. Pt's daughter was informed that I did call and leave a voicemail for the social worker and any paperwork that needs to be completed can be sent to Dr. Ronnie Derby office.

## 2018-04-13 DIAGNOSIS — N1330 Unspecified hydronephrosis: Secondary | ICD-10-CM | POA: Diagnosis not present

## 2018-04-19 ENCOUNTER — Other Ambulatory Visit: Payer: Self-pay | Admitting: Urology

## 2018-04-20 DIAGNOSIS — M810 Age-related osteoporosis without current pathological fracture: Secondary | ICD-10-CM | POA: Diagnosis not present

## 2018-04-20 DIAGNOSIS — Z7189 Other specified counseling: Secondary | ICD-10-CM | POA: Diagnosis not present

## 2018-04-20 DIAGNOSIS — S82121D Displaced fracture of lateral condyle of right tibia, subsequent encounter for closed fracture with routine healing: Secondary | ICD-10-CM | POA: Diagnosis not present

## 2018-04-20 DIAGNOSIS — S82121A Displaced fracture of lateral condyle of right tibia, initial encounter for closed fracture: Secondary | ICD-10-CM | POA: Diagnosis not present

## 2018-04-21 ENCOUNTER — Other Ambulatory Visit: Payer: Self-pay | Admitting: Endocrinology

## 2018-04-25 ENCOUNTER — Telehealth: Payer: Self-pay | Admitting: Endocrinology

## 2018-04-25 NOTE — Telephone Encounter (Signed)
Patient's daughter [name] [phone] called re: Order for Rehab has not been sent. [name] requesting the Order be put in for rehab. Patient is staying at daughter [facility] home at this time and can be reached at the ph# listed above.

## 2018-04-25 NOTE — Telephone Encounter (Signed)
Please advise 

## 2018-04-25 NOTE — Telephone Encounter (Signed)
If the orthopedic doctor has ordered physical therapy did need to continue signing the orders.  She also does not have an appointment to come back for follow-up with labs

## 2018-04-26 NOTE — Telephone Encounter (Signed)
Called and left detailed voicemail on pt's daughters phone with MD message.

## 2018-04-26 NOTE — Telephone Encounter (Signed)
Called pt's daughter and informed her of MD message. Pt's daughter verbalized understanding.

## 2018-04-26 NOTE — Telephone Encounter (Signed)
If the physical therapy was related to her orthopedic problem it should be signed by orthopedic team.  Also needs appointment with labs for follow-up

## 2018-04-26 NOTE — Telephone Encounter (Signed)
Pt's daughter called back and stated that pt was released on 04/14/2018 and got in home equipment but [facility] never got the paperwork. [facility] says they faxed this paperwork to our office, but we have not received anything.  Pt's daughter stated that her Ortho MD has not signed any PT orders, and she thought that since Dr. Dwyane Dee was her PCP, he would do it.  Pt's daughter stated that she has been home for 2 weeks now, and has not been getting therapy of any kind at the moment.

## 2018-04-28 ENCOUNTER — Telehealth: Payer: Self-pay | Admitting: Endocrinology

## 2018-04-28 NOTE — Telephone Encounter (Signed)
Error

## 2018-05-01 DIAGNOSIS — E1122 Type 2 diabetes mellitus with diabetic chronic kidney disease: Secondary | ICD-10-CM | POA: Diagnosis not present

## 2018-05-01 DIAGNOSIS — S82141D Displaced bicondylar fracture of right tibia, subsequent encounter for closed fracture with routine healing: Secondary | ICD-10-CM | POA: Diagnosis not present

## 2018-05-01 DIAGNOSIS — Z794 Long term (current) use of insulin: Secondary | ICD-10-CM | POA: Diagnosis not present

## 2018-05-01 DIAGNOSIS — M1711 Unilateral primary osteoarthritis, right knee: Secondary | ICD-10-CM | POA: Diagnosis not present

## 2018-05-01 DIAGNOSIS — Z79891 Long term (current) use of opiate analgesic: Secondary | ICD-10-CM | POA: Diagnosis not present

## 2018-05-01 DIAGNOSIS — N183 Chronic kidney disease, stage 3 (moderate): Secondary | ICD-10-CM | POA: Diagnosis not present

## 2018-05-01 DIAGNOSIS — I129 Hypertensive chronic kidney disease with stage 1 through stage 4 chronic kidney disease, or unspecified chronic kidney disease: Secondary | ICD-10-CM | POA: Diagnosis not present

## 2018-05-01 DIAGNOSIS — Z7901 Long term (current) use of anticoagulants: Secondary | ICD-10-CM | POA: Diagnosis not present

## 2018-05-09 DIAGNOSIS — E1122 Type 2 diabetes mellitus with diabetic chronic kidney disease: Secondary | ICD-10-CM | POA: Diagnosis not present

## 2018-05-09 DIAGNOSIS — S82141D Displaced bicondylar fracture of right tibia, subsequent encounter for closed fracture with routine healing: Secondary | ICD-10-CM | POA: Diagnosis not present

## 2018-05-09 DIAGNOSIS — I129 Hypertensive chronic kidney disease with stage 1 through stage 4 chronic kidney disease, or unspecified chronic kidney disease: Secondary | ICD-10-CM | POA: Diagnosis not present

## 2018-05-09 DIAGNOSIS — Z794 Long term (current) use of insulin: Secondary | ICD-10-CM | POA: Diagnosis not present

## 2018-05-09 DIAGNOSIS — N183 Chronic kidney disease, stage 3 (moderate): Secondary | ICD-10-CM | POA: Diagnosis not present

## 2018-05-09 DIAGNOSIS — M1711 Unilateral primary osteoarthritis, right knee: Secondary | ICD-10-CM | POA: Diagnosis not present

## 2018-05-11 DIAGNOSIS — I129 Hypertensive chronic kidney disease with stage 1 through stage 4 chronic kidney disease, or unspecified chronic kidney disease: Secondary | ICD-10-CM | POA: Diagnosis not present

## 2018-05-11 DIAGNOSIS — Z794 Long term (current) use of insulin: Secondary | ICD-10-CM | POA: Diagnosis not present

## 2018-05-11 DIAGNOSIS — S82141D Displaced bicondylar fracture of right tibia, subsequent encounter for closed fracture with routine healing: Secondary | ICD-10-CM | POA: Diagnosis not present

## 2018-05-11 DIAGNOSIS — E1122 Type 2 diabetes mellitus with diabetic chronic kidney disease: Secondary | ICD-10-CM | POA: Diagnosis not present

## 2018-05-11 DIAGNOSIS — M1711 Unilateral primary osteoarthritis, right knee: Secondary | ICD-10-CM | POA: Diagnosis not present

## 2018-05-11 DIAGNOSIS — N183 Chronic kidney disease, stage 3 (moderate): Secondary | ICD-10-CM | POA: Diagnosis not present

## 2018-05-15 DIAGNOSIS — E1122 Type 2 diabetes mellitus with diabetic chronic kidney disease: Secondary | ICD-10-CM | POA: Diagnosis not present

## 2018-05-15 DIAGNOSIS — I129 Hypertensive chronic kidney disease with stage 1 through stage 4 chronic kidney disease, or unspecified chronic kidney disease: Secondary | ICD-10-CM | POA: Diagnosis not present

## 2018-05-15 DIAGNOSIS — N183 Chronic kidney disease, stage 3 (moderate): Secondary | ICD-10-CM | POA: Diagnosis not present

## 2018-05-15 DIAGNOSIS — Z794 Long term (current) use of insulin: Secondary | ICD-10-CM | POA: Diagnosis not present

## 2018-05-15 DIAGNOSIS — S82141D Displaced bicondylar fracture of right tibia, subsequent encounter for closed fracture with routine healing: Secondary | ICD-10-CM | POA: Diagnosis not present

## 2018-05-15 DIAGNOSIS — M1711 Unilateral primary osteoarthritis, right knee: Secondary | ICD-10-CM | POA: Diagnosis not present

## 2018-05-16 DIAGNOSIS — Z780 Asymptomatic menopausal state: Secondary | ICD-10-CM | POA: Diagnosis not present

## 2018-05-16 DIAGNOSIS — M85832 Other specified disorders of bone density and structure, left forearm: Secondary | ICD-10-CM | POA: Diagnosis not present

## 2018-05-16 DIAGNOSIS — M810 Age-related osteoporosis without current pathological fracture: Secondary | ICD-10-CM | POA: Diagnosis not present

## 2018-05-16 DIAGNOSIS — Z7189 Other specified counseling: Secondary | ICD-10-CM | POA: Diagnosis not present

## 2018-05-16 DIAGNOSIS — S82121A Displaced fracture of lateral condyle of right tibia, initial encounter for closed fracture: Secondary | ICD-10-CM | POA: Diagnosis not present

## 2018-05-17 ENCOUNTER — Other Ambulatory Visit: Payer: Self-pay | Admitting: Cardiovascular Disease

## 2018-05-17 DIAGNOSIS — M1711 Unilateral primary osteoarthritis, right knee: Secondary | ICD-10-CM | POA: Diagnosis not present

## 2018-05-17 DIAGNOSIS — I129 Hypertensive chronic kidney disease with stage 1 through stage 4 chronic kidney disease, or unspecified chronic kidney disease: Secondary | ICD-10-CM | POA: Diagnosis not present

## 2018-05-17 DIAGNOSIS — S82141D Displaced bicondylar fracture of right tibia, subsequent encounter for closed fracture with routine healing: Secondary | ICD-10-CM | POA: Diagnosis not present

## 2018-05-17 DIAGNOSIS — E1122 Type 2 diabetes mellitus with diabetic chronic kidney disease: Secondary | ICD-10-CM | POA: Diagnosis not present

## 2018-05-17 DIAGNOSIS — Z794 Long term (current) use of insulin: Secondary | ICD-10-CM | POA: Diagnosis not present

## 2018-05-17 DIAGNOSIS — N183 Chronic kidney disease, stage 3 (moderate): Secondary | ICD-10-CM | POA: Diagnosis not present

## 2018-05-17 NOTE — Progress Notes (Signed)
[facility] C[facility] DR. Roderic Palau BERRY 02-22-18 Epic   EKG 02-22-18 Epic  ECHO 04-15-16 Epic

## 2018-05-18 ENCOUNTER — Telehealth: Payer: Self-pay | Admitting: Endocrinology

## 2018-05-18 ENCOUNTER — Other Ambulatory Visit: Payer: Self-pay

## 2018-05-18 MED ORDER — LEVOTHYROXINE SODIUM 125 MCG PO TABS: ORAL_TABLET | ORAL | 3 refills | Status: DC

## 2018-05-18 NOTE — Telephone Encounter (Signed)
Rx sent 

## 2018-05-18 NOTE — Telephone Encounter (Signed)
MEDICATION: levothyroxine (SYNTHROID, LEVOTHROID) 125 MCG tablet  PHARMACY:  [id number] - [place], [place] - [address] AT [facility] OF [address]  IS THIS A 90 DAY SUPPLY :   IS PATIENT OUT OF MEDICATION:  Yes  IF NOT; HOW MUCH IS LEFT:    LAST APPOINTMENT DATE: @2 /21/2020  NEXT APPOINTMENT DATE:@4 /20/2020  DO WE HAVE YOUR PERMISSION TO LEAVE A DETAILED MESSAGE:  OTHER COMMENTS:    **Let patient know to contact pharmacy at the end of the day to make sure medication is ready. **  ** Please notify patient to allow 48-72 hours to process**  **Encourage patient to contact the pharmacy for refills or they can request refills through [facility]**

## 2018-05-19 ENCOUNTER — Other Ambulatory Visit: Payer: Self-pay

## 2018-05-19 ENCOUNTER — Encounter (HOSPITAL_COMMUNITY): Payer: Self-pay

## 2018-05-19 ENCOUNTER — Encounter (HOSPITAL_COMMUNITY)
Admission: RE | Admit: 2018-05-19 | Discharge: 2018-05-19 | Disposition: A | Discharge status: Home / Self Care | Payer: Medicare Other | Source: Ambulatory Visit | Attending: Urology | Admitting: Urology

## 2018-05-19 DIAGNOSIS — Z01812 Encounter for preprocedural laboratory examination: Secondary | ICD-10-CM | POA: Insufficient documentation

## 2018-05-19 HISTORY — DX: Other specified postprocedural states: Z98.890

## 2018-05-19 HISTORY — DX: Unspecified atrial fibrillation: I48.91

## 2018-05-19 HISTORY — DX: Nausea with vomiting, unspecified: R11.2

## 2018-05-19 HISTORY — DX: Iron deficiency anemia, unspecified: D50.9

## 2018-05-19 LAB — CBC
HCT: 38.1 % (ref 36.0–46.0)
Hemoglobin: 11.6 g/dL — ABNORMAL LOW (ref 12.0–15.0)
MCH: 30.2 pg (ref 26.0–34.0)
MCHC: 30.4 g/dL (ref 30.0–36.0)
MCV: 99.2 fL (ref 80.0–100.0)
Platelets: 217 10*3/uL (ref 150–400)
RBC: 3.84 MIL/uL — ABNORMAL LOW (ref 3.87–5.11)
RDW: 13.6 % (ref 11.5–15.5)
WBC: 8.6 10*3/uL (ref 4.0–10.5)
nRBC: 0 % (ref 0.0–0.2)

## 2018-05-19 LAB — BASIC METABOLIC PANEL
Anion gap: 8 (ref 5–15)
BUN: 43 mg/dL — ABNORMAL HIGH (ref 8–23)
CO2: 22 mmol/L (ref 22–32)
CREATININE: 1.32 mg/dL — AB (ref 0.44–1.00)
Calcium: 9.5 mg/dL (ref 8.9–10.3)
Chloride: 112 mmol/L — ABNORMAL HIGH (ref 98–111)
GFR calc Af Amer: 45 mL/min — ABNORMAL LOW (ref >60)
GFR, EST NON AFRICAN AMERICAN: 39 mL/min — AB (ref >60)
Glucose, Bld: 132 mg/dL — ABNORMAL HIGH (ref 70–99)
Potassium: 4.9 mmol/L (ref 3.5–5.1)
Sodium: 142 mmol/L (ref 135–145)

## 2018-05-19 LAB — HEMOGLOBIN A1C
HEMOGLOBIN A1C: 6.3 % — AB (ref 4.8–5.6)
MEAN PLASMA GLUCOSE: 134.11 mg/dL

## 2018-05-19 LAB — GLUCOSE, CAPILLARY: Glucose-Capillary: 138 mg/dL — ABNORMAL HIGH (ref 70–99)

## 2018-05-19 NOTE — Patient Instructions (Addendum)
[name] [name]  05/19/2018   Your procedure is scheduled on: 05-26-2018   Report to [facility] Main  Entrance     Report to admitting at 10:30AM    Call this number if you have problems the morning of surgery [phone]      Remember: Do not eat food or drink liquids :After Midnight. BRUSH YOUR TEETH MORNING OF SURGERY AND RINSE YOUR MOUTH OUT, NO CHEWING GUM CANDY OR MINTS.     Take these medicines the morning of surgery with A SIP OF WATER: isosorbide, LEVOTHYROXINE, OMEPRAZOLE, TYLENOL IF NEEDED, ALBUTEROL INHALER    INSTRUCTIONS FOR YOUR DIABETES MEDICATION  -ONLY TAKE HALF DOSE OF LEVEMIR INSULIN THE NIGHT BEFORE SURGERY  -DO NOT TAKE DINNER DOSE OF NOVOLOG INSULIN THE NIGHT BEFORE SURGERY  -PLEASE CHECK YOUR BLOOD SUGAR THE DAY OF SURGERY.  ONLY TAKE YOUR NOVOLOG INSULIN THE MORNING OF SURGERY IF YOUR BLOOD SUGAR IS GREATER THAN 220. IF SO, ONLY TAKE HALF YOUR NORMAL DOSE OF NOVOLOG INSULIN  REPORT YOUR BLOOD SUGAR TO YOUR NURSE ON  ARRIVAL   IF YOUR BLOOD SUGAR IS LESS THAN 70 THE MORNING SURGERY WHEN YOU CHECK IT AT HOME, YOU NEED TO CALL THE [phone] FOR FURTHER INSTRUCTIONS!                               You may not have any metal on your body including hair pins and              piercings  Do not wear jewelry, make-up, lotions, powders or perfumes, deodorant             Do not wear nail polish.  Do not shave  48 hours prior to surgery.              Men may shave face and neck.   Do not bring valuables to the hospital. [place].  Contacts, dentures or bridgework may not be worn into surgery.  Leave suitcase in the car. After surgery it may be brought to your room.     Patients discharged the day of surgery will not be allowed to drive home. IF YOU ARE HAVING SURGERY AND GOING HOME THE SAME DAY, YOU MUST HAVE AN ADULT TO DRIVE YOU HOME AND BE WITH YOU FOR 24 HOURS. YOU MAY GO  HOME BY TAXI OR UBER OR ORTHERWISE, BUT AN ADULT MUST ACCOMPANY YOU HOME AND STAY WITH YOU FOR 24 HOURS.  Name and phone number of your driver:  Special Instructions: N/A              Please read over the following fact sheets you were given: _____________________________________________________________________             [facility] - Preparing for Surgery Before surgery, you can play an important role.  Because skin is not sterile, your skin needs to be as free of germs as possible.  You can reduce the number of germs on your skin by washing with CHG (chlorahexidine gluconate) soap before surgery.  CHG is an antiseptic cleaner which kills germs and bonds with the skin to continue killing germs even after washing. Please DO NOT use if you have an allergy to CHG or  antibacterial soaps.  If your skin becomes reddened/irritated stop using the CHG and inform your nurse when you arrive at Short Stay. Do not shave (including legs and underarms) for at least 48 hours prior to the first CHG shower.  You may shave your face/neck. Please follow these instructions carefully:  1.  Shower with CHG Soap the night before surgery and the  morning of Surgery.  2.  If you choose to wash your hair, wash your hair first as usual with your  normal  shampoo.  3.  After you shampoo, rinse your hair and body thoroughly to remove the  shampoo.                           4.  Use CHG as you would any other liquid soap.  You can apply chg directly  to the skin and wash                       Gently with a scrungie or clean washcloth.  5.  Apply the CHG Soap to your body ONLY FROM THE NECK DOWN.   Do not use on face/ open                           Wound or open sores. Avoid contact with eyes, ears mouth and genitals (private parts).                       Wash face,  Genitals (private parts) with your normal soap.             6.  Wash thoroughly, paying special attention to the area where your surgery  will be  performed.  7.  Thoroughly rinse your body with warm water from the neck down.  8.  DO NOT shower/wash with your normal soap after using and rinsing off  the CHG Soap.                9.  Pat yourself dry with a clean towel.            10.  Wear clean pajamas.            11.  Place clean sheets on your bed the night of your first shower and do not  sleep with pets. Day of Surgery : Do not apply any lotions/deodorants the morning of surgery.  Please wear clean clothes to the hospital/surgery center.  FAILURE TO FOLLOW THESE INSTRUCTIONS MAY RESULT IN THE CANCELLATION OF YOUR SURGERY PATIENT SIGNATURE_________________________________  NURSE SIGNATURE__________________________________  ________________________________________________________________________

## 2018-05-22 DIAGNOSIS — S82141D Displaced bicondylar fracture of right tibia, subsequent encounter for closed fracture with routine healing: Secondary | ICD-10-CM | POA: Diagnosis not present

## 2018-05-22 DIAGNOSIS — Z794 Long term (current) use of insulin: Secondary | ICD-10-CM | POA: Diagnosis not present

## 2018-05-22 DIAGNOSIS — I129 Hypertensive chronic kidney disease with stage 1 through stage 4 chronic kidney disease, or unspecified chronic kidney disease: Secondary | ICD-10-CM | POA: Diagnosis not present

## 2018-05-22 DIAGNOSIS — N183 Chronic kidney disease, stage 3 (moderate): Secondary | ICD-10-CM | POA: Diagnosis not present

## 2018-05-22 DIAGNOSIS — E1122 Type 2 diabetes mellitus with diabetic chronic kidney disease: Secondary | ICD-10-CM | POA: Diagnosis not present

## 2018-05-22 DIAGNOSIS — M1711 Unilateral primary osteoarthritis, right knee: Secondary | ICD-10-CM | POA: Diagnosis not present

## 2018-05-24 DIAGNOSIS — E1122 Type 2 diabetes mellitus with diabetic chronic kidney disease: Secondary | ICD-10-CM | POA: Diagnosis not present

## 2018-05-24 DIAGNOSIS — M1711 Unilateral primary osteoarthritis, right knee: Secondary | ICD-10-CM | POA: Diagnosis not present

## 2018-05-24 DIAGNOSIS — S82141D Displaced bicondylar fracture of right tibia, subsequent encounter for closed fracture with routine healing: Secondary | ICD-10-CM | POA: Diagnosis not present

## 2018-05-24 DIAGNOSIS — I129 Hypertensive chronic kidney disease with stage 1 through stage 4 chronic kidney disease, or unspecified chronic kidney disease: Secondary | ICD-10-CM | POA: Diagnosis not present

## 2018-05-24 DIAGNOSIS — N183 Chronic kidney disease, stage 3 (moderate): Secondary | ICD-10-CM | POA: Diagnosis not present

## 2018-05-24 DIAGNOSIS — Z794 Long term (current) use of insulin: Secondary | ICD-10-CM | POA: Diagnosis not present

## 2018-05-24 NOTE — Progress Notes (Signed)
Anesthesia Chart Review   Case:  [id number] Date/Time:  05/26/18 1215   Procedure:  CYSTOSCOPY WITH RIGHT RETROGRADE PYELOGRAM RIGHT URETERAL STENT EXCHANGE (Right )   Anesthesia type:  General   Pre-op diagnosis:  RIGHT URETERAL STRICTURE   Location:  [facility] ROOM 02 / [facility]   Surgeon:  Ardis Hughs, MD      DISCUSSION: 78 yo former smoker (60 pack years, quit 06/07/09) with h/o PONV, HTN, hypothyroidism, CAD (s/p CABG x 6 1997),  CHF, HLD, CKD Stage III, PVD, carotid artery stenosis (s/p bilateral endarterectomies, moderately severe left ICA stenosis being followed by cardiologist), AAA s/p aortobifemoral bypass grafting and right fem-pop bypass grafting with lifestyle-limiting claudication, A-fib, DM II, anemia, right ureteral stricture scheduled for above procedure 05/26/18 with Dr. Louis Meckel.   Pt last seen by cardiologist, Dr. Quay Burow, 02/22/2018.  Stable at this visit.  Per Dr. Kennon Holter note pt asymptomatic, aortobifem bypass graft patent on [place] 11/01/17, following moderately severe left ICA stenosis.    Discussed case with Dr. Gifford Shave.  Pt can proceed with planned procedure barring acute status change.  VS: BP (!) 192/59   Pulse 64   Temp 36.4 C (Oral)   Resp 20   Ht 5\' 6"  (1.676 m)   Wt 63.2 kg   SpO2 100%   BMI 22.50 kg/m   PROVIDERS: [name], MD is PCP  Quay Burow, MD is Cardiologist  LABS: Labs reviewed: Acceptable for surgery. (all labs ordered are listed, but only abnormal results are displayed)  Labs Reviewed  GLUCOSE, CAPILLARY - Abnormal; Notable for the following components:      Result Value   Glucose-Capillary 138 (*)    All other components within normal limits  HEMOGLOBIN A1C - Abnormal; Notable for the following components:   Hgb A1c MFr Bld 6.3 (*)    All other components within normal limits  BASIC METABOLIC PANEL - Abnormal; Notable for the following components:   Chloride 112 (*)    Glucose, Bld 132 (*)    BUN 43 (*)    Creatinine, Ser 1.32 (*)    GFR calc non Af Amer 39 (*)    GFR calc Af Amer 45 (*)    All other components within normal limits  CBC - Abnormal; Notable for the following components:   RBC 3.84 (*)    Hemoglobin 11.6 (*)    All other components within normal limits     IMAGES: Carotid Doppler 02/06/2018 Summary: Right Carotid: Velocities in the right ICA are consistent with a total                occlusion. The CCA appears occluded. Left Carotid: Velocities in the left ICA are consistent with a 60-79% stenosis.               Non-hemodynamically significant plaque noted in the CCA. Vertebrals:  Right vertebral artery demonstrates antegrade flow. Left vertebral              artery appears occluded with possible collateral flow visualized. Subclavians: Normal flow hemodynamics were seen in bilateral subclavian              arteries.  EKG: 02/22/18 Rate 64 bpm Normal sinus rhythm  ST & T abnormality, consider lateral ischemia   CV: Echo 04/15/2016 Study Conclusions  - Left ventricle: The cavity size was mildly dilated. Wall   thickness was normal. Systolic function was mildly to moderately   reduced. The estimated ejection fraction was  in the range of 40%   to 45%. Diffuse hypokinesis. Doppler parameters are consistent   with abnormal left ventricular relaxation (grade 1 diastolic   dysfunction). Doppler parameters are consistent with high   ventricular filling pressure. - Mitral valve: Calcified annulus. There was mild regurgitation. - Left atrium: The atrium was mildly dilated.  Impressions:  - Mild to moderate global reduction in LV systolic function; grade   1 diastolic dysfunction with elevated LV filling pressure; mild   LVE; mild MR; mild LAE.  Past Medical History:  Diagnosis Date  . A-fib ([place])   . AAA (abdominal aortic aneurysm) ([place])   . Bilateral hydronephrosis   . Carotid artery stenosis    carotid doppler 06/2012 - Right CCA/Bulb/ICA with chronic  occlusion; L vertebral artery with abnormal blood flow; L Bulb/Prox ICA  s/p endarterectomy with mild fibrous plaque, 50% diameter reduction  . CHF (congestive heart failure) ([place])   . Choledocholithiasis 2017  . CKD (chronic kidney disease), stage III ([place])   . Coronary artery disease due to lipid rich plaque cardiologist-  dr berry   s/p CABG x6 1997-- cath 12-09-2009 occluded vein to obtuse marginal branch and ramus branch with patent vien to PDA and patent LIMA to LAD, ef 40%-- Myoview 11-24-2011, nonischemic  . Diverticulosis   . Dyspnea on exertion   . GERD (gastroesophageal reflux disease)   . GI bleed   . History of sepsis    10-18-2014 w/ acute pyelonephritis  . Hyperlipidemia   . Hypertension   . Hypothyroidism   . Iron deficiency anemia   . Ischemic cardiomyopathy    03-25-2010-- per lasts echo EF  50-55%  . Ischemic cardiomyopathy   . PAD (peripheral artery disease) ([place])    09/2010 LEAs - R ABI of 0.45, occluded fem-pop bypass graft, L ABI of 0.59 with occluded SFA; severe arterial insuff  . PONV (postoperative nausea and vomiting)   . PVD (peripheral vascular disease) with claudication ([place])    last duplex 07-04-2015 -- Right CCA and ICA chronic occlusion, 47-09% LICA, Patent vertebral arteries w/ antegrade flow, bilateral normal subclavian arteries   . Retroperitoneal fibrosis   . Tubular adenoma of colon   . Type 2 diabetes mellitus ([place])    monitored by dr Dwyane Dee    Past Surgical History:  Procedure Laterality Date  . AORTA - BILATERAL FEMORAL ARTERY BYPASS GRAFT  1997   and RIGHT FEM-POP   . CARDIAC CATHETERIZATION  12-09-2009  dr al little   EF >40%-- occluded vein to OM & ramus branches, patent vein to PDA, patent LIMA to LAD (Dr. [name], [facility]) - later had thrombectomy of R fem-pop bypass ad R common femoral & profunda femoris artery (Dr. Oneida Alar)  . CARDIOVASCULAR STRESS TEST  11-24-2011   dr berry   Low Risk study: fixed basal to mid inferior attenuation  artifact, no reversible ischemia,  normal LV function and wall motion , ef 67%  . CAROTID ENDARTERECTOMY Bilateral right 1994//  left ?  Marland Kitchen CATARACT EXTRACTION W/ INTRAOCULAR LENS  IMPLANT, BILATERAL  2006  . CHOLECYSTECTOMY N/A 09/05/2015   Procedure: ATTEMPTED LAPAROSCOPIC CHOLECYSTECTOMY, EXPLORATORY LAPAROTOMY WITH CHOLECYSTECTOMY;  Surgeon: Autumn Messing III, MD;  Location: [place];  Service: General;  Laterality: N/A;  . COLONOSCOPY    . COLONOSCOPY  10/21/2015  . COLONOSCOPY WITH PROPOFOL N/A 10/21/2015   Procedure: COLONOSCOPY WITH PROPOFOL;  Surgeon: Milus Banister, MD;  Location: [place];  Service: Endoscopy;  Laterality: N/A;  . CORONARY ARTERY  BYPASS GRAFT  1997   x6; internal mammary to LAD, SVG to ramus #1 & #2, SVG to OM, SVG to PDA,   . CYSTOSCOPY W/ RETROGRADES Right 08/01/2015   Procedure: CYSTOSCOPY WITH RETROGRADE PYELOGRAM;  Surgeon: Ardis Hughs, MD;  Location: [facility];  Service: Urology;  Laterality: Right;  . CYSTOSCOPY W/ URETERAL STENT PLACEMENT  03/10/2012   Procedure: CYSTOSCOPY WITH RETROGRADE PYELOGRAM/URETERAL STENT PLACEMENT;  Surgeon: Hanley Ben, MD;  [facility];  Service: Urology;  Laterality: Left;  . CYSTOSCOPY W/ URETERAL STENT PLACEMENT Bilateral 03/07/2015   Procedure: BILATERAL RETROGRADE PYELOGRAM AND RIGHT URETERAL STENT PLACEMENT;  Surgeon: Ardis Hughs, MD;  Location[facility];  Service: Urology;  Laterality: Bilateral;  . CYSTOSCOPY W/ URETERAL STENT PLACEMENT Right 08/01/2015   Procedure: CYSTOSCOPY WITH STENT REPLACEMENT;  Surgeon: Ardis Hughs, MD;  Location: [facility];  Service: Urology;  Laterality: Right;  . CYSTOSCOPY W/ URETERAL STENT PLACEMENT Right 02/13/2016   Procedure: RIGHT URETERAL STENT EXCHANGE;  Surgeon: Ardis Hughs, MD;  [facility];  Service: Urology;  Laterality: Right;  . CYSTOSCOPY W/ URETERAL STENT PLACEMENT Right 09/17/2016   Procedure:  CYSTOSCOPY WITH RIGHT  RETROGRADE PYELOGRAM RIGHT URETERAL STENT EXCHANGE;  Surgeon: Ardis Hughs, MD;  [facility];  Service: Urology;  Laterality: Right;  . CYSTOSCOPY W/ URETERAL STENT PLACEMENT Bilateral 03/18/2017   Procedure: CYSTOSCOPY WITH BILATERAL  RETROGRADE PYELOGRAM RIGHT URETERAL STENT [place];  Surgeon: Ardis Hughs, MD;  [facility];  Service: Urology;  Laterality: Bilateral;  . CYSTOSCOPY WITH STENT PLACEMENT Right 10/06/2017   Procedure: CYSTOSCOPY, RETROGRADE  WITH RIGHT STENT EXCHANGE;  Surgeon: Ardis Hughs, MD;  [facility];  Service: Urology;  Laterality: Right;  . ERCP N/A 09/03/2015   Procedure: ENDOSCOPIC RETROGRADE CHOLANGIOPANCREATOGRAPHY (ERCP);  Surgeon: Irene Shipper, MD;  Location[facility];  Service: Endoscopy;  Laterality: N/A;  . ESOPHAGOGASTRODUODENOSCOPY    . ESOPHAGOGASTRODUODENOSCOPY N/A 10/19/2015   Procedure: ESOPHAGOGASTRODUODENOSCOPY (EGD);  Surgeon: Gatha Mayer, MD;  Location: [facility]COPY;  Service: Endoscopy;  Laterality: N/A;  . GIVENS CAPSULE STUDY  10/21/2015  . GIVENS CAPSULE STUDY N/A 10/21/2015   Procedure: GIVENS CAPSULE STUDY;  Surgeon: Milus Banister, MD;  Location: [place];  Service: Endoscopy;  Laterality: N/A;  . KNEE SURGERY Right 03-01-2018-03-04-2018   PLATEAU FRACTURE HOSPITLIZATION   . REPAIR RIGHT FEMORAL PSEUDOANEUYSM/  RIGHT FEM-POP BYPASS GRAFT/  DEBRIDEMENT RIGHT LOWER EXTREMITIY VENOUS STATUS ULCERS X2  01-12-2005  . TOTAL ABDOMINAL HYSTERECTOMY W/ BILATERAL SALPINGOOPHORECTOMY  1986  . TRANSTHORACIC ECHOCARDIOGRAM  03/25/2010   EF 24-23%, LV systolic function low normal with mild inferoseptal hypocontractility; LA mildly dilated; mod MR; mild TR, RV systolic pressure elevated, mild pulm HTN; AV mildly sclerotic; mild pulm valve regurg; aortic root sclerosis/calcif     MEDICATIONS: . acetaminophen (TYLENOL) 325 MG tablet  . albuterol (PROVENTIL HFA;VENTOLIN HFA) 108 (90 Base)  MCG/ACT inhaler  . aspirin 81 MG chewable tablet  . calcium-vitamin D (OSCAL WITH D) 500-200 MG-UNIT per tablet  . clopidogrel (PLAVIX) 75 MG tablet  . ferrous sulfate 325 (65 FE) MG tablet  . furosemide (LASIX) 20 MG tablet  . glucose blood (ONE TOUCH ULTRA TEST) test strip  . HYDROcodone-acetaminophen (NORCO/VICODIN) 5-325 MG tablet  . insulin detemir (LEVEMIR) 100 UNIT/ML injection  . Insulin Pen Needle 32G X 4 MM MISC  . isosorbide mononitrate (IMDUR) 30 MG 24 hr tablet  . latanoprost (XALATAN) 0.005 %  ophthalmic solution  . levothyroxine (SYNTHROID, LEVOTHROID) 125 MCG tablet  . lidocaine (LIDODERM) 5 %  . loperamide (IMODIUM A-D) 2 MG tablet  . metoprolol succinate (TOPROL-XL) 25 MG 24 hr tablet  . NOVOLOG FLEXPEN 100 UNIT/ML FlexPen  . omeprazole (PRILOSEC) 20 MG capsule  . polyethylene glycol (MIRALAX / GLYCOLAX) packet  . rosuvastatin (CRESTOR) 20 MG tablet  . vitamin B-12 (CYANOCOBALAMIN) 1000 MCG tablet  . vitamin C (ASCORBIC ACID) 500 MG tablet  . Vitamin D, Ergocalciferol, (DRISDOL) 1.25 MG (50000 UT) CAPS capsule   No current facility-administered medications for this encounter.      Maia Plan [facility] Pre-Surgical Testing [phone] 05/25/18 3:42 PM

## 2018-05-25 MED ORDER — GENTAMICIN SULFATE 40 MG/ML IJ SOLN
300.0000 mg | INTRAVENOUS | Status: AC
  Administered 2018-05-26: 300 mg via INTRAVENOUS
  Filled 2018-05-25 (×2): qty 7.5

## 2018-05-25 NOTE — Anesthesia Preprocedure Evaluation (Addendum)
Anesthesia Evaluation  Patient identified by MRN, date of birth, ID band Patient awake    Reviewed: Allergy & Precautions, NPO status , Patient's Chart, lab work & pertinent test results, reviewed documented beta blocker date and time   History of Anesthesia Complications (+) PONV  Airway Mallampati: II  TM Distance: >3 FB Neck ROM: Full    Dental  (+) Dental Advisory Given, Poor Dentition,    Pulmonary former smoker,    Pulmonary exam normal breath sounds clear to auscultation       Cardiovascular hypertension, Pt. on home beta blockers and Pt. on medications + CAD, + CABG (1997), + Peripheral Vascular Disease and +CHF  Normal cardiovascular exam+ dysrhythmias Atrial Fibrillation  Rhythm:Regular Rate:Normal  TTE 2/18: EF 40-45%, diffuse hypokinesis, grade 1 diastolic dysfunction, mild MR, mild LAE     Neuro/Psych negative neurological ROS     GI/Hepatic Neg liver ROS, GERD  Medicated and Controlled,  Endo/Other  diabetes, Type 2, Insulin DependentHypothyroidism   Renal/GU Renal InsufficiencyRenal disease     Musculoskeletal  (+) Arthritis ,   Abdominal   Peds  Hematology  (+) anemia ,   Anesthesia Other Findings Day of surgery medications reviewed with the patient.  Reproductive/Obstetrics                           Anesthesia Physical Anesthesia Plan  ASA: III  Anesthesia Plan: General   Post-op Pain Management:    Induction: Intravenous  PONV Risk Score and Plan: 4 or greater and Treatment may vary due to age or medical condition and Ondansetron  Airway Management Planned: LMA  Additional Equipment:   Intra-op Plan:   Post-operative Plan: Extubation in OR  Informed Consent: I have reviewed the patients History and Physical, chart, labs and discussed the procedure including the risks, benefits and alternatives for the proposed anesthesia with the patient or authorized  representative who has indicated his/her understanding and acceptance.     Dental advisory given  Plan Discussed with: CRNA  Anesthesia Plan Comments: (See PAT note 05/19/18, Konrad Felix, PA-C)      Anesthesia Quick Evaluation

## 2018-05-26 ENCOUNTER — Encounter (HOSPITAL_COMMUNITY)
Admission: RE | Disposition: A | Discharge status: Home / Self Care | Payer: Self-pay | Source: Home / Self Care | Attending: Urology

## 2018-05-26 ENCOUNTER — Ambulatory Visit (HOSPITAL_COMMUNITY): Payer: Medicare Other

## 2018-05-26 ENCOUNTER — Ambulatory Visit (HOSPITAL_COMMUNITY)
Admission: RE | Admit: 2018-05-26 | Discharge: 2018-05-26 | Disposition: A | Discharge status: Home / Self Care | Payer: Medicare Other | Attending: Urology | Admitting: Urology

## 2018-05-26 ENCOUNTER — Ambulatory Visit (HOSPITAL_COMMUNITY): Payer: Medicare Other | Admitting: Physician Assistant

## 2018-05-26 ENCOUNTER — Encounter (HOSPITAL_COMMUNITY): Payer: Self-pay | Admitting: Emergency Medicine

## 2018-05-26 ENCOUNTER — Other Ambulatory Visit: Payer: Self-pay

## 2018-05-26 ENCOUNTER — Ambulatory Visit (HOSPITAL_COMMUNITY): Payer: Medicare Other | Admitting: Anesthesiology

## 2018-05-26 DIAGNOSIS — I4891 Unspecified atrial fibrillation: Secondary | ICD-10-CM | POA: Insufficient documentation

## 2018-05-26 DIAGNOSIS — D631 Anemia in chronic kidney disease: Secondary | ICD-10-CM | POA: Insufficient documentation

## 2018-05-26 DIAGNOSIS — Z8249 Family history of ischemic heart disease and other diseases of the circulatory system: Secondary | ICD-10-CM | POA: Insufficient documentation

## 2018-05-26 DIAGNOSIS — Z794 Long term (current) use of insulin: Secondary | ICD-10-CM | POA: Diagnosis not present

## 2018-05-26 DIAGNOSIS — Z79890 Hormone replacement therapy (postmenopausal): Secondary | ICD-10-CM | POA: Insufficient documentation

## 2018-05-26 DIAGNOSIS — Z7982 Long term (current) use of aspirin: Secondary | ICD-10-CM | POA: Diagnosis not present

## 2018-05-26 DIAGNOSIS — E1151 Type 2 diabetes mellitus with diabetic peripheral angiopathy without gangrene: Secondary | ICD-10-CM | POA: Diagnosis not present

## 2018-05-26 DIAGNOSIS — K219 Gastro-esophageal reflux disease without esophagitis: Secondary | ICD-10-CM | POA: Diagnosis not present

## 2018-05-26 DIAGNOSIS — I255 Ischemic cardiomyopathy: Secondary | ICD-10-CM | POA: Insufficient documentation

## 2018-05-26 DIAGNOSIS — I1310 Hypertensive heart and chronic kidney disease without heart failure, with stage 1 through stage 4 chronic kidney disease, or unspecified chronic kidney disease: Secondary | ICD-10-CM | POA: Insufficient documentation

## 2018-05-26 DIAGNOSIS — M1711 Unilateral primary osteoarthritis, right knee: Secondary | ICD-10-CM | POA: Diagnosis not present

## 2018-05-26 DIAGNOSIS — Z87891 Personal history of nicotine dependence: Secondary | ICD-10-CM | POA: Insufficient documentation

## 2018-05-26 DIAGNOSIS — E039 Hypothyroidism, unspecified: Secondary | ICD-10-CM | POA: Diagnosis not present

## 2018-05-26 DIAGNOSIS — I10 Essential (primary) hypertension: Secondary | ICD-10-CM | POA: Diagnosis not present

## 2018-05-26 DIAGNOSIS — Z79899 Other long term (current) drug therapy: Secondary | ICD-10-CM | POA: Insufficient documentation

## 2018-05-26 DIAGNOSIS — E785 Hyperlipidemia, unspecified: Secondary | ICD-10-CM | POA: Diagnosis not present

## 2018-05-26 DIAGNOSIS — I2510 Atherosclerotic heart disease of native coronary artery without angina pectoris: Secondary | ICD-10-CM | POA: Diagnosis not present

## 2018-05-26 DIAGNOSIS — N131 Hydronephrosis with ureteral stricture, not elsewhere classified: Secondary | ICD-10-CM

## 2018-05-26 DIAGNOSIS — N189 Chronic kidney disease, unspecified: Secondary | ICD-10-CM | POA: Insufficient documentation

## 2018-05-26 DIAGNOSIS — E1122 Type 2 diabetes mellitus with diabetic chronic kidney disease: Secondary | ICD-10-CM | POA: Diagnosis not present

## 2018-05-26 DIAGNOSIS — Z419 Encounter for procedure for purposes other than remedying health state, unspecified: Secondary | ICD-10-CM

## 2018-05-26 DIAGNOSIS — Z7902 Long term (current) use of antithrombotics/antiplatelets: Secondary | ICD-10-CM | POA: Diagnosis not present

## 2018-05-26 DIAGNOSIS — N135 Crossing vessel and stricture of ureter without hydronephrosis: Secondary | ICD-10-CM | POA: Diagnosis not present

## 2018-05-26 HISTORY — PX: CYSTOSCOPY W/ URETERAL STENT PLACEMENT: SHX1429

## 2018-05-26 LAB — GLUCOSE, CAPILLARY
Glucose-Capillary: 106 mg/dL — ABNORMAL HIGH (ref 70–99)
Glucose-Capillary: 132 mg/dL — ABNORMAL HIGH (ref 70–99)

## 2018-05-26 SURGERY — CYSTOSCOPY, WITH RETROGRADE PYELOGRAM AND URETERAL STENT INSERTION
Anesthesia: General | Laterality: Right

## 2018-05-26 MED ORDER — LACTATED RINGERS IV SOLN
INTRAVENOUS | Status: DC
  Administered 2018-05-26: 11:00:00 via INTRAVENOUS

## 2018-05-26 MED ORDER — PROPOFOL 10 MG/ML IV BOLUS
INTRAVENOUS | Status: AC
  Filled 2018-05-26: qty 20

## 2018-05-26 MED ORDER — SODIUM CHLORIDE 0.9 % IR SOLN
Status: DC | PRN
  Administered 2018-05-26: 3000 mL

## 2018-05-26 MED ORDER — OXYCODONE HCL 5 MG PO TABS: 5.0000 mg | ORAL_TABLET | Freq: Once | ORAL | Status: DC | PRN

## 2018-05-26 MED ORDER — ACETAMINOPHEN 10 MG/ML IV SOLN: 1000.0000 mg | Freq: Once | INTRAVENOUS | Status: DC | PRN

## 2018-05-26 MED ORDER — ONDANSETRON HCL 4 MG/2ML IJ SOLN
INTRAMUSCULAR | Status: AC
  Filled 2018-05-26: qty 2

## 2018-05-26 MED ORDER — LIDOCAINE HCL (CARDIAC) PF 100 MG/5ML IV SOSY
PREFILLED_SYRINGE | INTRAVENOUS | Status: DC | PRN
  Administered 2018-05-26: 40 mg via INTRAVENOUS

## 2018-05-26 MED ORDER — FENTANYL CITRATE (PF) 100 MCG/2ML IJ SOLN
INTRAMUSCULAR | Status: DC | PRN
  Administered 2018-05-26 (×2): 25 ug via INTRAVENOUS

## 2018-05-26 MED ORDER — FENTANYL CITRATE (PF) 100 MCG/2ML IJ SOLN: 25.0000 ug | INTRAMUSCULAR | Status: DC | PRN

## 2018-05-26 MED ORDER — FENTANYL CITRATE (PF) 100 MCG/2ML IJ SOLN
INTRAMUSCULAR | Status: AC
  Filled 2018-05-26: qty 2

## 2018-05-26 MED ORDER — PROPOFOL 10 MG/ML IV BOLUS
INTRAVENOUS | Status: DC | PRN
  Administered 2018-05-26: 100 mg via INTRAVENOUS

## 2018-05-26 MED ORDER — ONDANSETRON HCL 4 MG/2ML IJ SOLN
INTRAMUSCULAR | Status: DC | PRN
  Administered 2018-05-26: 4 mg via INTRAVENOUS

## 2018-05-26 MED ORDER — OXYCODONE HCL 5 MG/5ML PO SOLN: 5.0000 mg | Freq: Once | ORAL | Status: DC | PRN

## 2018-05-26 MED ORDER — BELLADONNA ALKALOIDS-OPIUM 16.2-30 MG RE SUPP
RECTAL | Status: AC
  Filled 2018-05-26: qty 1

## 2018-05-26 MED ORDER — LIP MEDEX EX OINT
TOPICAL_OINTMENT | CUTANEOUS | Status: AC
  Filled 2018-05-26: qty 7

## 2018-05-26 MED ORDER — LIDOCAINE 2% (20 MG/ML) 5 ML SYRINGE
INTRAMUSCULAR | Status: AC
  Filled 2018-05-26: qty 5

## 2018-05-26 MED ORDER — PROMETHAZINE HCL 25 MG/ML IJ SOLN: 6.2500 mg | INTRAMUSCULAR | Status: DC | PRN

## 2018-05-26 SURGICAL SUPPLY — 20 items
BAG URO CATCHER STRL LF (MISCELLANEOUS) ×3 IMPLANT
BASKET ZERO TIP NITINOL 2.4FR (BASKET) IMPLANT
CATH URET 5FR 28IN OPEN ENDED (CATHETERS) ×3 IMPLANT
CLOTH BEACON ORANGE TIMEOUT ST (SAFETY) ×3 IMPLANT
COVER WAND RF STERILE (DRAPES) IMPLANT
EXTRACTOR STONE 1.7FRX115CM (UROLOGICAL SUPPLIES) IMPLANT
GLOVE BIOGEL M STRL SZ7.5 (GLOVE) ×3 IMPLANT
GOWN STRL REUS W/TWL XL LVL3 (GOWN DISPOSABLE) ×3 IMPLANT
GUIDEWIRE ANG ZIPWIRE 038X150 (WIRE) IMPLANT
GUIDEWIRE STR DUAL SENSOR (WIRE) ×3 IMPLANT
KIT TURNOVER KIT A (KITS) IMPLANT
MANIFOLD NEPTUNE II (INSTRUMENTS) ×3 IMPLANT
PACK CYSTO (CUSTOM PROCEDURE TRAY) ×3 IMPLANT
SHEATH URETERAL 12FRX28CM (UROLOGICAL SUPPLIES) IMPLANT
SHEATH URETERAL 12FRX35CM (MISCELLANEOUS) IMPLANT
STENT URET 6FRX24 CONTOUR (STENTS) ×3 IMPLANT
TUBING CONNECTING 10 (TUBING) ×2 IMPLANT
TUBING CONNECTING 10' (TUBING) ×1
TUBING UROLOGY SET (TUBING) ×3 IMPLANT
WIRE COONS/BENSON .038X145CM (WIRE) IMPLANT

## 2018-05-26 NOTE — Discharge Instructions (Signed)
General Anesthesia, Adult General anesthesia is the use of medicines to make a person "go to sleep" (unconscious) for a medical procedure. General anesthesia must be used for certain procedures, and is often recommended for procedures that:  Last a long time.  Require you to be still or in an unusual position.  Are major and can cause blood loss. The medicines used for general anesthesia are called general anesthetics. As well as making you unconscious for a certain amount of time, these medicines:  Prevent pain.  Control your blood pressure.  Relax your muscles. Tell a health care provider about:  Any allergies you have.  All medicines you are taking, including vitamins, herbs, eye drops, creams, and over-the-counter medicines.  Any problems you or family members have had with anesthetic medicines.  Types of anesthetics you have had in the past.  Any blood disorders you have.  Any surgeries you have had.  Any medical conditions you have.  Any recent upper respiratory, chest, or ear infections.  Any history of: ? Heart or lung conditions, such as heart failure, sleep apnea, asthma, or chronic obstructive pulmonary disease (COPD). ? Armed forces logistics/support/administrative officer. ? Depression or anxiety.  Any tobacco or drug use, including marijuana or alcohol use.  Whether you are pregnant or may be pregnant. What are the risks? Generally, this is a safe procedure. However, problems may occur, including:  Allergic reaction.  Lung and heart problems.  Inhaling food or liquid from the stomach into the lungs (aspiration).  Nerve injury.  Dental injury.  Air in the bloodstream, which can lead to stroke.  Extreme agitation or confusion (delirium) when you wake up from the anesthetic.  Waking up during your procedure and being unable to move. This is rare. These problems are more likely to develop if you are having a major surgery or if you have an advanced or serious medical condition. You  can prevent some of these complications by answering all of your health care provider's questions thoroughly and by following all instructions before your procedure. General anesthesia can cause side effects, including:  Nausea or vomiting.  A sore throat from the breathing tube.  Hoarseness.  Wheezing or coughing.  Shaking chills.  Tiredness.  Body aches.  Anxiety.  Sleepiness or drowsiness.  Confusion or agitation. What happens before the procedure? Staying hydrated Follow instructions from your health care provider about hydration, which may include:  Up to 2 hours before the procedure - you may continue to drink clear liquids, such as water, clear fruit juice, black coffee, and plain tea.  Eating and drinking restrictions Follow instructions from your health care provider about eating and drinking, which may include:  8 hours before the procedure - stop eating heavy meals or foods such as meat, fried foods, or fatty foods.  6 hours before the procedure - stop eating light meals or foods, such as toast or cereal.  6 hours before the procedure - stop drinking milk or drinks that contain milk.  2 hours before the procedure - stop drinking clear liquids. Medicines Ask your health care provider about:  Changing or stopping your regular medicines. This is especially important if you are taking diabetes medicines or blood thinners.  Taking medicines such as aspirin and ibuprofen. These medicines can thin your blood. Do not take these medicines unless your health care provider tells you to take them.  Taking over-the-counter medicines, vitamins, herbs, and supplements. Do not take these during the week before your procedure unless your health care  provider approves them. General instructions  Starting 3-6 weeks before the procedure, do not use any products that contain nicotine or tobacco, such as cigarettes and e-cigarettes. If you need help quitting, ask your health care  provider.  If you brush your teeth on the morning of the procedure, make sure to spit out all of the toothpaste.  Tell your health care provider if you become ill or develop a cold, cough, or fever.  If instructed by your health care provider, bring your sleep apnea device with you on the day of your surgery (if applicable).  Ask your health care provider if you will be going home the same day, the following day, or after a longer hospital stay. ? Plan to have someone take you home from the hospital or clinic. ? Plan to have a responsible adult care for you for at least 24 hours after you leave the hospital or clinic. This is important. What happens during the procedure?   You will be given anesthetics through both of the following: ? A mask placed over your nose and mouth. ? An IV in one of your veins.  You may receive a medicine to help you relax (sedative).  After you are unconscious, a breathing tube may be inserted down your throat to help you breathe. This will be removed before you wake up.  An anesthesia specialist will stay with you throughout your procedure. He or she will: ? Keep you comfortable and safe by continuing to give you medicines and adjusting the amount of medicine that you get. ? Monitor your blood pressure, pulse, and oxygen levels to make sure that the anesthetics do not cause any problems. The procedure may vary among health care providers and hospitals. What happens after the procedure?  Your blood pressure, temperature, heart rate, breathing rate, and blood oxygen level will be monitored until the medicines you were given have worn off.  You will wake up in a recovery area. You may wake up slowly.  If you feel anxious or agitated, you may be given medicine to help you calm down.  If you will be going home the same day, your health care provider may check to make sure you can walk, drink, and urinate.  Your health care provider will treat any pain or  side effects you have before you go home.  Do not drive for 24 hours if you were given a sedative. Summary  General anesthesia is used to keep you still and prevent pain during a procedure.  It is important to tell your health care provider about your medical history and any surgeries you have had, and previous experience with anesthesia.  Follow your health care provider's instructions about when to stop eating, drinking, or taking certain medicines before your procedure.  Plan to have someone take you home from the hospital or clinic. This information is not intended to replace advice given to you by your health care provider. Make sure you discuss any questions you have with your health care provider. Document Released: 06/01/2007 Document Revised: 07/12/2017 Document Reviewed: 10/08/2016 Elsevier Interactive Patient Education  2019 [place] FOR KIDNEY STONE/URETERAL STENT   MEDICATIONS:  1.  Resume all your other meds from home - except do not take any extra narcotic pain meds that you may have at home.   ACTIVITY:  1. No strenuous activity x 1week  2. No driving while on narcotic pain medications  3. Drink plenty of water  4. Continue to  walk at home - you can still get blood clots when you are at home, so keep active, but don't over do it.  5. May return to work/school tomorrow or when you feel ready   BATHING:  1. You can shower and we recommend daily showers    SIGNS/SYMPTOMS TO CALL:  Please call us if you have a fever greater than 101.5, uncontrolled nausea/vomiting, uncontrolled pain, dizziness, unable to urinate, bloody urine, chest pain, shortness of breath, leg swelling, leg pain, redness around wound, drainage from wound, or any other concerns or questions.   You can reach [place] at [phone].   FOLLOW-UP:  1. You have an appointment in 5 months

## 2018-05-26 NOTE — Anesthesia Postprocedure Evaluation (Signed)
Anesthesia Post Note  Patient: [name] [name]  Procedure(s) Performed: CYSTOSCOPY WITH RIGHT RETROGRADE PYELOGRAM RIGHT URETERAL STENT EXCHANGE (Right )     Patient location during evaluation: PACU Anesthesia Type: General Level of consciousness: awake and alert Pain management: pain level controlled Vital Signs Assessment: post-procedure vital signs reviewed and stable Respiratory status: spontaneous breathing, nonlabored ventilation and respiratory function stable Cardiovascular status: blood pressure returned to baseline and stable Postop Assessment: no apparent nausea or vomiting Anesthetic complications: no    Last Vitals:  Vitals:   05/26/18 1400 05/26/18 1415  BP: (!) 170/63 (!) 177/65  Pulse: (!) 57 (!) 59  Resp: 15 16  Temp:  37.2 C  SpO2: 100% 100%    Last Pain:  Vitals:   05/26/18 1430  TempSrc:   PainSc: [place] E Myron Stankovich

## 2018-05-26 NOTE — Transfer of Care (Signed)
Immediate Anesthesia Transfer of Care Note  Patient: [name] [name]  Procedure(s) Performed: CYSTOSCOPY WITH RIGHT RETROGRADE PYELOGRAM RIGHT URETERAL STENT EXCHANGE (Right )  Patient Location: PACU  Anesthesia Type:General  Level of Consciousness: drowsy, patient cooperative and responds to stimulation  Airway & Oxygen Therapy: Patient Spontanous Breathing and Patient connected to face mask oxygen  Post-op Assessment: Report given to RN and Post -op Vital signs reviewed and stable  Post vital signs: Reviewed and stable  Last Vitals:  Vitals Value Taken Time  BP 162/60 05/26/2018  1:30 PM  Temp    Pulse 59 05/26/2018  1:35 PM  Resp 12 05/26/2018  1:35 PM  SpO2 100 % 05/26/2018  1:35 PM  Vitals shown include unvalidated device data.  Last Pain:  Vitals:   05/26/18 1041  TempSrc:   PainSc: 0-No pain      Patients Stated Pain Goal: 2 (67/12/45 8099)  Complications: No apparent anesthesia complications

## 2018-05-26 NOTE — H&P (Signed)
Ureteral Stricture  HPI: [name] [name] is a 78 year-old female established patient who is here for further eval and management of a ureteral stricture.  Her problem has been present for a few years. She has had previous surgery as a possible source of her ureteral stricture. She has had chronic stent for treatment of their ureteral stricture.   She is currently not having trouble urinating. She is not having problems getting her urine stream started. The patient does not complain of urinary leakage. She is not urinating more frequently now than usual. She does not dribble at the end of urination.   She is having flank pain. She does have a history of urinary infections. Her renal function is normal. She did not see blood in her urine.   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 The left side was initially obstructed and managed with a stent. Differential function demonstrated little function and stent was removed. Right hydro developed in Fall 2016. No being managed with ureteral stents, not a good surgical candidate otherwise.   12/18: She states being hospitalized for dehydration and urinary tract infection over the Thanksgiving holiday.   08/08/17: The patient has not had any hospitalizations or infections since she was last seen. She is tolerating stent well without any significant symptoms. She denies any frequency, hematuria, or dysuria.   01/19/18: Patient with above noted history. Most recent right ureteral stent exchange were performed by Dr. Louis Meckel on 10/06/17. She presents today with complaints of a weaker urinary stream and some intermittency. She states that she continues to feels she is able to empty well, though it takes longer. She denies any right flank pain, but does have some lower back pain on the left. She denies bothersome urgency, frequency, dysuria, or gross hematuria. No fever, chills, nausea, or vomiting. She also states that Dr. Dwyane Dee stated that her creatinine  had increased. It is up slightly to 2.04 on 11/11, compared to 1.78 in July 2019. BUN on 11/11 noted to be 40. She admits to drinking very little water daily and mostly diet soda.   Intv: The patient presents today for preop evaluation. She has a right ureteral stent which we have been exchanging on a 6 month interval. She last had her stent exchanged in August 2019. Patient had a recent knee injury and is currently at facility. She is scheduled to leave tomorrow. Beyond her recent fall and knee injury she has done quite well. She denies any significant hematuria or dysuria. She denies any worsening voiding symptoms.     ALLERGIES: None    MEDICATIONS: Crestor 20 mg tablet  Levothyroxine Sodium 125 mcg tablet  Acetaminophen 325 mg tablet  Albuterol Sulfate 2.5 mg/3 ml (0.083 %) vial, nebulizer Inhalation  Aspirin Ec 81 mg tablet, delayed release  Calcium + D  Clopidogrel 75 mg tablet  Ferrous Sulfate 325 mg (65 mg iron) tablet  Ferrous Sulfate 325 (65 Fe) MG Oral Tablet Delayed Release Oral  Hydrocodone-Acetaminophen 5 mg-325 mg tablet  Imodium A-D 2 mg capsule  Isosorbide Mononitrate Er 30 mg tablet, extended release 24 hr  Lasix 20 mg tablet  Latanoprost 0.005 % drops  Levemir FlexPen 100 UNIT/ML SOLN Subcutaneous  Lidoderm 5 % adhesive patch, medicated  Metoprolol Succinate 25 mg tablet, extended release 24 hr Oral  Miralax  Novolog Flexpen 100 unit/ml (3 ml) insulin pen Subcutaneous  Omeprazole 20 mg capsule,delayed release Oral  Rosuvastatin Calcium 20 mg tablet  Sodium Bicarbonate 650 mg tablet  Tylenol Arthritis  650 mg tablet, extended release  Ventolin Hfa 90 mcg hfa aerosol with adapter  Vitamin B-12 TABS Oral  Vitamin C  Vitamin D2 50,000 unit capsule Oral     GU PSH: Cystoscopy Insert Stent, Right - 10/06/2017, Right - 03/18/2017, Right - 09/17/2016, Right - 02/13/2016, 2017, 2017, 2014, 2014 Hysterectomy Unilat SO - 2014      [place] Notes: Cystoscopy With Insertion Of  Ureteral Stent Right, Cystoscopy With Insertion Of Ureteral Stent Right, Cesarean Section, Hysterectomy, Coronary Artery Single Venous Bypass Graft, Cystoscopy With Insertion Of Ureteral Stent, Cystoscopy With Insertion Of Ureteral Stent Left   NON-GU PSH: CABG (coronary artery bypass grafting) - 2014 Cesarean Delivery Only - 2014 Cholecystectomy (open), 09/2015    GU PMH: Ureteral stricture - 01/19/2018, - 08/08/2017, Right, - 02/08/2017, Right, 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 This is being managed withchronic indwelling stents. She is due to have her stent changed soon, her last stent was exchanged 6 months prior. 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 She had a stent placed in the spring of 2015 followed by renogram demonstrating 18% function on the left kidney. Her stent was subsequently removed.  Recently she developed bilateral hydronephrosis, renogram demonstrated bilateral obstruction with 85% right function and 15% left sided function a significant change from her previous ultrasounds and renograms. In Jan 2015 she had no hydro or evidence of right sided obstruction.   02/21/15 bilateral RPGs- left side showed a normal distal ureter to the pelvic brim where it stopped abruptly and I was unable to opacify the proximal portion. On the right side showed a normal distal ureter with abrupt narrowing at the pelvic brim and proximal dilation. A right sided stent was placed.   She has a history of recurrent infections. In 2015 she had 4 Escherichia  coli infections.   03/2015: treated for UTI - keflex - no growth. recommdended lactobacillus, as well as cranberry tablets twice daily   2/17: BUN/Cr- 30/1.78     NON-GU PMH: Encounter for general adult medical examination without abnormal findings, Encounter for preventive health examination - 2016 Personal history of other diseases of the circulatory system, History of cardiac disorder - 2014, History of hypertension, - 2014 Personal history of other endocrine, nutritional and metabolic disease, History of hypercholesterolemia - 2014, History of hypothyroidism, - 2014, History of diabetes mellitus, - 2014 Atherosclerotic heart disease of native coronary artery without angina pectoris GERD History of falling Hyperlipidemia, unspecified 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 1 through stage 4 chronic kidney disease, or unspecified chronic kidney disease Hypothyroidism Ischemic cardiomyopathy Occlusion and stenosis of unspecified carotid 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 Smoked for 30 years.   Tobacco Use Assessment Completed: Used Tobacco in last 30 days? Social Drinker.  Drinks 1 caffeinated drink per day.     Notes: Former smoker, Tobacco use,  Marital History - Single, Retired From Work, Alcohol Use, Caffeine Use   REVIEW OF SYSTEMS:    GU Review Female:   Patient denies frequent urination, hard to postpone urination, burning /pain with urination, get up at night to urinate, leakage of urine, stream starts and stops, trouble starting your stream, have to strain to urinate, and being pregnant.  Gastrointestinal (Upper):   Patient denies nausea, vomiting, and indigestion/ heartburn.  Gastrointestinal (Lower):   Patient denies diarrhea and constipation.  Constitutional:   Patient denies fever, night sweats, weight loss, and fatigue.  Skin:   Patient denies skin rash/ lesion and itching.  Eyes:   Patient denies blurred vision and double vision.  Ears/ Nose/ Throat:   Patient denies sore throat and sinus problems.  Hematologic/Lymphatic:   Patient reports easy bruising. Patient denies swollen glands.  Cardiovascular:   Patient denies leg swelling and chest pains.  Respiratory:   Patient denies cough and shortness of breath.  Endocrine:   Patient denies excessive thirst.  Musculoskeletal:   Patient reports back pain. Patient denies joint pain.  Neurological:   Patient denies headaches and dizziness.  Psychologic:   Patient denies depression and anxiety.   VITAL SIGNS:      04/13/2018 03:20 PM  Weight 137 lb / 62.14 kg  Height 66 in / 167.64 cm  BP 160/70 mmHg  Pulse 72 /min  Temperature 98.6 F / 37 C  BMI 22.1 kg/m   MULTI-SYSTEM PHYSICAL EXAMINATION:    Constitutional: Well-nourished. No physical deformities. Normally developed. Good grooming.  Respiratory: Normal breath sounds. No labored breathing, no use of accessory muscles.   Cardiovascular: Regular rate and rhythm. No murmur, no gallop. Normal temperature, normal extremity pulses, no swelling, no varicosities.      PAST DATA REVIEWED:  Source Of History:  Patient  Records Review:   Previous Doctor Records, Previous Hospital Records, Previous Patient Records, POC Tool    PROCEDURES:          Urinalysis w/Scope Dipstick Dipstick Cont'd Micro  Color: Yellow Bilirubin: Neg mg/dL WBC/hpf: 10 - 20/hpf  Appearance: Cloudy Ketones: Neg mg/dL RBC/hpf: 3 - 10/hpf  Specific Gravity: 1.015 Blood: Trace ery/uL Bacteria: Mod (26-50/hpf)  pH: 7.0 Protein: Trace mg/dL Cystals: NS (Not Seen)  Glucose: Neg mg/dL Urobilinogen: 0.2 mg/dL Casts: NS (Not Seen)    Nitrites: Neg Trichomonas: Not Present    Leukocyte Esterase: 3+ leu/uL Mucous: Not Present      Epithelial Cells: 0 - 5/hpf      Yeast: NS (Not Seen)      Sperm: Not Present    ASSESSMENT:      ICD-10 Details  1 GU:   Ureteral stricture - N13.5    PLAN:           Orders Labs Urine Culture          Document Letter(s):  Created for Patient: Clinical Summary         Notes:   The patient is due for her right ureteral stent exchange. She has been through this procedure before and knows what to expect. I described for again, will try to get  this done for her soon. I will send her urine for culture and plan to treat her week prior to her procedure. She does not need any clearance. She can continue with her aspirin any other anticoagulation medications as this is low risk for bleeding.

## 2018-05-26 NOTE — Anesthesia Procedure Notes (Signed)
Procedure Name: LMA Insertion Date/Time: 05/26/2018 1:05 PM Performed by: Glory Buff, CRNA Pre-anesthesia Checklist: Patient identified, Emergency Drugs available, Suction available and Patient being monitored Patient Re-evaluated:Patient Re-evaluated prior to induction Oxygen Delivery Method: Circle system utilized Preoxygenation: Pre-oxygenation with 100% oxygen Induction Type: IV induction Ventilation: Mask ventilation without difficulty LMA: LMA inserted LMA Size: 4.0 Number of attempts: 1 Placement Confirmation: positive ETCO2 Tube secured with: Tape Dental Injury: Teeth and Oropharynx as per pre-operative assessment

## 2018-05-26 NOTE — Op Note (Signed)
Preoperative diagnosis:  1. Right ureteral stricture  Postoperative diagnosis:  1. Same  Procedure: 1. Cystoscopy, right retrograde pyelogram with interpretation 2. Right ureteral stent exchange  Surgeon: Ardis Hughs, MD  Anesthesia: General  Complications: None  Intraoperative findings: 1.:  The patient's bladder was normal in appearance aside from the areas of the bladder mucosa where the  stent had created some irritation.  The remainder of the bladder was normal.  2.:  The right retrograde pyelogram demonstrated a ureteral stricture that went up past the sacrum and there was medial dilation.  There was mild significant hydroureteronephrosis, and no filling defects.  3.:  A 24 cm x 6 French double-J stent was exchanged.  EBL: Minimal  Specimens: None  Indication: [name] [name] is a 78 y.o. patient with history of ureteral strictures.   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  As such, we are exchanging stents on the right side only.  These are being changed every 6 months.  After reviewing the management options for treatment, he elected to proceed with the above surgical procedure(s). We have discussed the potential benefits and risks of the procedure, side effects of the proposed treatment, the likelihood of the patient achieving the goals of the procedure, and any potential problems that might occur during the procedure or recuperation. Informed consent has been obtained.  Description of procedure:  The patient was taken to the operating room and general anesthesia was induced.  The patient was placed in the dorsal lithotomy position, prepped and draped in the usual sterile fashion, and preoperative antibiotics were administered. A preoperative time-out was performed.   A 30 degree 21 French cystoscope was gently passed to the patient's urethra and into the bladder under visual guidance.   300 degrees cystoscopic evaluation which demonstrated the above  findings.  Using a stent grasper the stent was grasped and pulled to the urethral meatus.  A 0.38 sensor wire was then advanced through the stent and into the right renal pelvis removing the stent over the wire.  The stent was noted to be in fairly good condition without any significant encrustation.  The wire was then exchanged for an open-ended catheter and a retrograde pyelogram was then performed with the above findings.  The wire was then repassed exchanged to the open-ended catheter and then a 24 cm x 6 French double-J stent was advanced over the wire and into the right renal pelvis under fluoroscopic guidance.  Once the stent was noted to be well within the renal pelvis it was advanced to the urethral meatus and the wire was removed.  The beak of the cystoscope was then gently passed into the patient's urethra and fluoroscopic copy demonstrated a nice curl within the bladder.  The patient was subsequently extubated and returned to the PACU in stable condition.  She will be scheduled for follow-up stent exchange in 6 months.  Ardis Hughs, M.D.

## 2018-05-29 ENCOUNTER — Encounter (HOSPITAL_COMMUNITY): Payer: Self-pay | Admitting: Urology

## 2018-05-31 DIAGNOSIS — Z7901 Long term (current) use of anticoagulants: Secondary | ICD-10-CM | POA: Diagnosis not present

## 2018-05-31 DIAGNOSIS — N183 Chronic kidney disease, stage 3 (moderate): Secondary | ICD-10-CM | POA: Diagnosis not present

## 2018-05-31 DIAGNOSIS — Z79891 Long term (current) use of opiate analgesic: Secondary | ICD-10-CM | POA: Diagnosis not present

## 2018-05-31 DIAGNOSIS — M1711 Unilateral primary osteoarthritis, right knee: Secondary | ICD-10-CM | POA: Diagnosis not present

## 2018-05-31 DIAGNOSIS — I129 Hypertensive chronic kidney disease with stage 1 through stage 4 chronic kidney disease, or unspecified chronic kidney disease: Secondary | ICD-10-CM | POA: Diagnosis not present

## 2018-05-31 DIAGNOSIS — E1122 Type 2 diabetes mellitus with diabetic chronic kidney disease: Secondary | ICD-10-CM | POA: Diagnosis not present

## 2018-05-31 DIAGNOSIS — S82141D Displaced bicondylar fracture of right tibia, subsequent encounter for closed fracture with routine healing: Secondary | ICD-10-CM | POA: Diagnosis not present

## 2018-05-31 DIAGNOSIS — Z794 Long term (current) use of insulin: Secondary | ICD-10-CM | POA: Diagnosis not present

## 2018-06-01 ENCOUNTER — Telehealth: Payer: Self-pay

## 2018-06-01 NOTE — Telephone Encounter (Signed)
Pt called and requested a refill for Hyrdocodone and requested that it be sent to [facility] on [address]. In [place].

## 2018-06-02 ENCOUNTER — Other Ambulatory Visit: Payer: Self-pay | Admitting: Endocrinology

## 2018-06-02 MED ORDER — HYDROCODONE-ACETAMINOPHEN 5-325 MG PO TABS: 1.0000 | ORAL_TABLET | Freq: Two times a day (BID) | ORAL | 0 refills | Status: DC | PRN

## 2018-06-02 NOTE — Telephone Encounter (Signed)
Called [name] [name] and informed her of this. [name] [name] stated that she has not had a chance to get a new PCP yet and would like to stay with Dr. Dwyane Dee until she gets a new PCP

## 2018-06-02 NOTE — Telephone Encounter (Signed)
I can send a one-time prescription.  Since she is now seeing a family medicine doctor nearby she will need to get further prescriptions from them

## 2018-06-06 DIAGNOSIS — E1122 Type 2 diabetes mellitus with diabetic chronic kidney disease: Secondary | ICD-10-CM | POA: Diagnosis not present

## 2018-06-06 DIAGNOSIS — S82141D Displaced bicondylar fracture of right tibia, subsequent encounter for closed fracture with routine healing: Secondary | ICD-10-CM | POA: Diagnosis not present

## 2018-06-06 DIAGNOSIS — Z794 Long term (current) use of insulin: Secondary | ICD-10-CM | POA: Diagnosis not present

## 2018-06-06 DIAGNOSIS — M1711 Unilateral primary osteoarthritis, right knee: Secondary | ICD-10-CM | POA: Diagnosis not present

## 2018-06-06 DIAGNOSIS — N183 Chronic kidney disease, stage 3 (moderate): Secondary | ICD-10-CM | POA: Diagnosis not present

## 2018-06-06 DIAGNOSIS — I129 Hypertensive chronic kidney disease with stage 1 through stage 4 chronic kidney disease, or unspecified chronic kidney disease: Secondary | ICD-10-CM | POA: Diagnosis not present

## 2018-06-14 ENCOUNTER — Other Ambulatory Visit: Payer: Self-pay | Admitting: Endocrinology

## 2018-06-14 DIAGNOSIS — Z794 Long term (current) use of insulin: Secondary | ICD-10-CM | POA: Diagnosis not present

## 2018-06-14 DIAGNOSIS — I129 Hypertensive chronic kidney disease with stage 1 through stage 4 chronic kidney disease, or unspecified chronic kidney disease: Secondary | ICD-10-CM | POA: Diagnosis not present

## 2018-06-14 DIAGNOSIS — E1122 Type 2 diabetes mellitus with diabetic chronic kidney disease: Secondary | ICD-10-CM | POA: Diagnosis not present

## 2018-06-14 DIAGNOSIS — S82141D Displaced bicondylar fracture of right tibia, subsequent encounter for closed fracture with routine healing: Secondary | ICD-10-CM | POA: Diagnosis not present

## 2018-06-14 DIAGNOSIS — N183 Chronic kidney disease, stage 3 (moderate): Secondary | ICD-10-CM | POA: Diagnosis not present

## 2018-06-14 DIAGNOSIS — M1711 Unilateral primary osteoarthritis, right knee: Secondary | ICD-10-CM | POA: Diagnosis not present

## 2018-06-21 DIAGNOSIS — Z794 Long term (current) use of insulin: Secondary | ICD-10-CM | POA: Diagnosis not present

## 2018-06-21 DIAGNOSIS — I129 Hypertensive chronic kidney disease with stage 1 through stage 4 chronic kidney disease, or unspecified chronic kidney disease: Secondary | ICD-10-CM | POA: Diagnosis not present

## 2018-06-21 DIAGNOSIS — N183 Chronic kidney disease, stage 3 (moderate): Secondary | ICD-10-CM | POA: Diagnosis not present

## 2018-06-21 DIAGNOSIS — M1711 Unilateral primary osteoarthritis, right knee: Secondary | ICD-10-CM | POA: Diagnosis not present

## 2018-06-21 DIAGNOSIS — S82141D Displaced bicondylar fracture of right tibia, subsequent encounter for closed fracture with routine healing: Secondary | ICD-10-CM | POA: Diagnosis not present

## 2018-06-21 DIAGNOSIS — E1122 Type 2 diabetes mellitus with diabetic chronic kidney disease: Secondary | ICD-10-CM | POA: Diagnosis not present

## 2018-06-23 ENCOUNTER — Other Ambulatory Visit: Payer: Self-pay | Admitting: Endocrinology

## 2018-06-26 ENCOUNTER — Other Ambulatory Visit: Payer: Medicare Other

## 2018-06-28 ENCOUNTER — Other Ambulatory Visit: Payer: Self-pay

## 2018-06-28 ENCOUNTER — Encounter: Payer: Self-pay | Admitting: Endocrinology

## 2018-06-28 ENCOUNTER — Ambulatory Visit (INDEPENDENT_AMBULATORY_CARE_PROVIDER_SITE_OTHER): Payer: Medicare Other | Admitting: Endocrinology

## 2018-06-28 DIAGNOSIS — E119 Type 2 diabetes mellitus without complications: Secondary | ICD-10-CM | POA: Diagnosis not present

## 2018-06-28 DIAGNOSIS — E063 Autoimmune thyroiditis: Secondary | ICD-10-CM | POA: Diagnosis not present

## 2018-06-28 DIAGNOSIS — E559 Vitamin D deficiency, unspecified: Secondary | ICD-10-CM

## 2018-06-28 DIAGNOSIS — E782 Mixed hyperlipidemia: Secondary | ICD-10-CM

## 2018-06-28 NOTE — Progress Notes (Signed)
Patient ID: [name] [name], female   DOB: 15-Jun-1940, 78 y.o.   MRN: [id number]   Today's office visit was provided via telemedicine using phone call Explained to the patient and the the limitations of evaluation and management by telemedicine and the availability of in person appointments.  The patient understood the limitations and agreed to proceed. Patient also understood that the telehealth visit is billable. . Location of the patient: Home . Location of the provider: Office Only the patient and myself were participating in the encounter  Detailed review of her records since her last visit in 11/19 was done including up-to-date studies, procedures, consultant notes and labs   Reason for Appointment: Follow-up of various problems  History of Present Illness   ?  TIA  She apparently had an episode where her right hand felt numb and her arm was somewhat heavy and difficult to coordinate This lasted overnight She was told to start Plavix in addition to her 81 mg aspirin but has not done so However has not had any other episodes  She does have 60-79% stenosis of the left carotid artery compared to up to 59% stenosis previously, right internal carotid artery is occluded  RENAL dysfunction/HYPERKALEMIA:  Renal function is variable and this time it has improved slightly She did have another stent for her hydronephrosis in March She does have a mild stable increase in microalbumin but no gross proteinuria  Potassium is upper normal usually She has been told to cut back on high potassium foods and given a list of high potassium foods Diet has not changed   Lab Results  Component Value Date   CREATININE 1.32 (H) 05/19/2018   CREATININE 1.58 (H) 02/24/2018   CREATININE 2.04 (H) 01/12/2018   CREATININE 1.78 (H) 10/03/2017   Lab Results  Component Value Date   K 4.9 05/19/2018      Type 2 DIABETES mellitus, date of diagnosis: 1986.   The insulin regimen is:  Levemir 10 units hs, Novolog 3-4 ac twice a day before meals  Type 2 diabetes  has been treated in the last few years with low dose basal bolus insulin regimen. She has not been taking  any oral hypoglycemic drugs   A1c is relatively higher 6.3 although done from a different lab, previously range 5.4-6.3 and not after meals   Current management and problems:  She has only a few blood sugars when her phone call was made to review her home readings  Recent fasting range 119-132 and dinnertime 166 she has been on a very stable dose of insulin with low doses especially mealtime dose  She is taking her insulin regularly  She thinks her appetite is fairly good She appears to have however lost 7 pounds although this will be checked at home  Side effects from medications: Diarrhea from metformin and nausea and vomiting from GLP-1 drugs   Monitors blood glucose: Less than 1 times a day  Glucometer: One Touch.   Blood sugar readings as above 30-day average 153  Meals: she is usually eating low fat meals especially recently meals usually at 11 AM,and supper at 5 pm.   Calorie intake: Usually controlled. Moderate Carbs Physical activity: exercise: Minimal  Dietician visit: Most recent:, 5/13.   Wt Readings from Last 3 Encounters:  05/26/18 139 lb 6.4 oz (63.2 kg)  05/19/18 139 lb 6.4 oz (63.2 kg)  02/22/18 136 lb (61.7 kg)    Lab Results  Component Value Date   HGBA1C  6.3 (H) 05/19/2018   HGBA1C 5.8 01/12/2018   HGBA1C 5.9 08/10/2017   Lab Results  Component Value Date   MICROALBUR 18.6 (H) 01/12/2018   LDLCALC 60 05/21/2016   CREATININE 1.32 (H) 05/19/2018    ANEMIA:  Has had iron deficiency and anemia of chronic disease, taking iron supplements as before   Lab Results  Component Value Date   WBC 8.6 05/19/2018   HGB 11.6 (L) 05/19/2018   HCT 38.1 05/19/2018   MCV 99.2 05/19/2018   PLT 217 05/19/2018      HYPOTHYROIDISM: She has had long-standing primary  hypothyroidism She is taking Levothyroxine once daily, 125 mcg  She takes levothyroxine consistently in the morning and is taking the iron in the evening No complaints of unusual tiredness recently  Last TSH normal  Lab Results  Component Value Date   TSH 1.40 01/12/2018   TSH 1.03 10/04/2017   TSH 43.69 (H) 08/10/2017   FREET4 0.96 01/12/2018   FREET4 1.44 10/04/2017   FREET4 0.84 08/10/2017    OTHER active problems: See review of systems    Allergies as of 06/28/2018   No Known Allergies     Medication List       Accurate as of June 28, 2018  2:12 PM. Always use your most recent med list.        acetaminophen 325 MG tablet Commonly known as:  TYLENOL Take 650 mg by mouth every 6 (six) hours as needed for mild pain.   albuterol 108 (90 Base) MCG/ACT inhaler Commonly known as:  VENTOLIN HFA INHALE 2 PUFFS INTO THE LUNGS EVERY 6 HOURS AS NEEDED FOR WHEEZING OR SHORTNESS OF BREATH   aspirin 81 MG chewable tablet Chew 81 mg by mouth daily.   calcium-vitamin D 500-200 MG-UNIT tablet Commonly known as:  OSCAL WITH D Take 1 tablet by mouth every morning.   ferrous sulfate 325 (65 FE) MG tablet Take 325 mg by mouth every evening.   furosemide 20 MG tablet Commonly known as:  LASIX Take 20 mg by mouth daily.   glucose blood test strip Commonly known as:  ONE TOUCH ULTRA TEST USE TO TEST BLOOD SUGAR 1 TO 2 TIMES DAILY AS DIRECTED   HYDROcodone-acetaminophen 5-325 MG tablet Commonly known as:  NORCO/VICODIN Take 1 tablet by mouth 2 (two) times daily as needed for moderate pain or severe pain.   Imodium A-D 2 MG tablet Generic drug:  loperamide Take 2 mg by mouth as needed for diarrhea or loose stools.   Insulin Pen Needle 32G X 4 MM Misc Use 4 times daily   isosorbide mononitrate 30 MG 24 hr tablet Commonly known as:  IMDUR TAKE 1 TABLET BY MOUTH EVERY DAY   latanoprost 0.005 % ophthalmic solution Commonly known as:  XALATAN Place 1 drop into both  eyes at bedtime.   Levemir 100 UNIT/ML injection Generic drug:  insulin detemir Inject 10 Units into the skin at bedtime.   levothyroxine 125 MCG tablet Commonly known as:  SYNTHROID TAKE 1 TABLET(125 MCG) BY MOUTH DAILY   lidocaine 5 % Commonly known as:  LIDODERM Place 1 patch onto the skin daily. Remove & Discard patch within 12 hours or as directed by MD   metoprolol succinate 25 MG 24 hr tablet Commonly known as:  TOPROL-XL TAKE 1 TABLET DAILY   NovoLOG FlexPen 100 UNIT/ML FlexPen Generic drug:  insulin aspart INJECT 8 UNITS 3 TIMES A   DAY WITH MEALS   omeprazole 20 MG capsule  Commonly known as:  PRILOSEC TAKE 1 CAPSULE DAILY   polyethylene glycol 17 g packet Commonly known as:  MIRALAX / GLYCOLAX Take 17 g by mouth daily as needed for moderate constipation.   rosuvastatin 20 MG tablet Commonly known as:  CRESTOR TAKE 1 TABLET DAILY   vitamin B-12 1000 MCG tablet Commonly known as:  CYANOCOBALAMIN Take 1,000 mcg by mouth daily.   vitamin C 500 MG tablet Commonly known as:  ASCORBIC ACID Take 500 mg by mouth at bedtime.   Vitamin D (Ergocalciferol) 1.25 MG (50000 UT) Caps capsule Commonly known as:  DRISDOL TAKE ONE CAPSULE BY MOUTH EVERY 7 DAYS(ON THURSDAYS)       Allergies: No Known Allergies  Past Medical History:  Diagnosis Date  . A-fib ([place])   . AAA (abdominal aortic aneurysm) ([place])   . Bilateral hydronephrosis   . Carotid artery stenosis    carotid doppler 06/2012 - Right CCA/Bulb/ICA with chronic occlusion; L vertebral artery with abnormal blood flow; L Bulb/Prox ICA  s/p endarterectomy with mild fibrous plaque, 50% diameter reduction  . CHF (congestive heart failure) ([place])   . Choledocholithiasis 2017  . CKD (chronic kidney disease), stage III ([place])   . Coronary artery disease due to lipid rich plaque cardiologist-  dr berry   s/p CABG x6 1997-- cath 12-09-2009 occluded vein to obtuse marginal branch and ramus branch with patent vien to PDA  and patent LIMA to LAD, ef 40%-- Myoview 11-24-2011, nonischemic  . Diverticulosis   . Dyspnea on exertion   . GERD (gastroesophageal reflux disease)   . GI bleed   . History of sepsis    10-18-2014 w/ acute pyelonephritis  . Hyperlipidemia   . Hypertension   . Hypothyroidism   . Iron deficiency anemia   . Ischemic cardiomyopathy    03-25-2010-- per lasts echo EF  50-55%  . Ischemic cardiomyopathy   . PAD (peripheral artery disease) ([place])    09/2010 LEAs - R ABI of 0.45, occluded fem-pop bypass graft, L ABI of 0.59 with occluded SFA; severe arterial insuff  . PONV (postoperative nausea and vomiting)   . PVD (peripheral vascular disease) with claudication ([place])    last duplex 07-04-2015 -- Right CCA and ICA chronic occlusion, 40-98% LICA, Patent vertebral arteries w/ antegrade flow, bilateral normal subclavian arteries   . Retroperitoneal fibrosis   . Tubular adenoma of colon   . Type 2 diabetes mellitus ([place])    monitored by dr Dwyane Dee    Past Surgical History:  Procedure Laterality Date  . AORTA - BILATERAL FEMORAL ARTERY BYPASS GRAFT  1997   and RIGHT FEM-POP   . CARDIAC CATHETERIZATION  12-09-2009  dr al little   EF >40%-- occluded vein to OM & ramus branches, patent vein to PDA, patent LIMA to LAD (Dr. [name], [facility]) - later had thrombectomy of R fem-pop bypass ad R common femoral & profunda femoris artery (Dr. Oneida Alar)  . CARDIOVASCULAR STRESS TEST  11-24-2011   dr berry   Low Risk study: fixed basal to mid inferior attenuation artifact, no reversible ischemia,  normal LV function and wall motion , ef 67%  . CAROTID ENDARTERECTOMY Bilateral right 1994//  left ?  Marland Kitchen CATARACT EXTRACTION W/ INTRAOCULAR LENS  IMPLANT, BILATERAL  2006  . CHOLECYSTECTOMY N/A 09/05/2015   Procedure: ATTEMPTED LAPAROSCOPIC CHOLECYSTECTOMY, EXPLORATORY LAPAROTOMY WITH CHOLECYSTECTOMY;  Surgeon: Autumn Messing III, MD;  Location: [facility];  Service: General;  Laterality: N/A;  . COLONOSCOPY    . COLONOSCOPY  10/21/2015  . COLONOSCOPY WITH PROPOFOL N/A 10/21/2015   Procedure: COLONOSCOPY WITH PROPOFOL;  Surgeon: Milus Banister, MD;  Location: [facility];  Service: Endoscopy;  Laterality: N/A;  . CORONARY ARTERY BYPASS GRAFT  1997   x6; internal mammary to LAD, SVG to ramus #1 & #2, SVG to OM, SVG to PDA,   . CYSTOSCOPY W/ RETROGRADES Right 08/01/2015   Procedure: CYSTOSCOPY WITH RETROGRADE PYELOGRAM;  Surgeon: Ardis Hughs, MD;  [facility];  Service: Urology;  Laterality: Right;  . CYSTOSCOPY W/ URETERAL STENT PLACEMENT  03/10/2012   Procedure: CYSTOSCOPY WITH RETROGRADE PYELOGRAM/URETERAL STENT PLACEMENT;  Surgeon: Hanley Ben, MD;  Location: [facility];  Service: Urology;  Laterality: Left;  . CYSTOSCOPY W/ URETERAL STENT PLACEMENT Bilateral 03/07/2015   Procedure: BILATERAL RETROGRADE PYELOGRAM AND RIGHT URETERAL STENT PLACEMENT;  Surgeon: Ardis Hughs, MD;  [facility];  Service: Urology;  Laterality: Bilateral;  . CYSTOSCOPY W/ URETERAL STENT PLACEMENT Right 08/01/2015   Procedure: CYSTOSCOPY WITH STENT REPLACEMENT;  Surgeon: Ardis Hughs, MD;  Location: [facility];  Service: Urology;  Laterality: Right;  . CYSTOSCOPY W/ URETERAL STENT PLACEMENT Right 02/13/2016   Procedure: RIGHT URETERAL STENT EXCHANGE;  Surgeon: Ardis Hughs, MD;  Location: [facility];  Service: Urology;  Laterality: Right;  . CYSTOSCOPY W/ URETERAL STENT PLACEMENT Right 09/17/2016   Procedure: CYSTOSCOPY WITH RIGHT  RETROGRADE PYELOGRAM RIGHT URETERAL STENT EXCHANGE;  Surgeon: Ardis Hughs, MD;  Location: [facility];  Service: Urology;  Laterality: Right;  . CYSTOSCOPY W/ URETERAL STENT PLACEMENT Bilateral 03/18/2017   Procedure: CYSTOSCOPY WITH BILATERAL  RETROGRADE PYELOGRAM RIGHT URETERAL STENT [place];  Surgeon: Ardis Hughs, MD;  Location: [facility];  Service: Urology;  Laterality: Bilateral;  . CYSTOSCOPY W/ URETERAL STENT  PLACEMENT Right 05/26/2018   Procedure: CYSTOSCOPY WITH RIGHT RETROGRADE PYELOGRAM RIGHT URETERAL STENT EXCHANGE;  Surgeon: Ardis Hughs, MD;  Location: [facility];  Service: Urology;  Laterality: Right;  . CYSTOSCOPY WITH STENT PLACEMENT Right 10/06/2017   Procedure: CYSTOSCOPY, RETROGRADE  WITH RIGHT STENT EXCHANGE;  Surgeon: Ardis Hughs, MD;  Location: [facility];  Service: Urology;  Laterality: Right;  . ERCP N/A 09/03/2015   Procedure: ENDOSCOPIC RETROGRADE CHOLANGIOPANCREATOGRAPHY (ERCP);  Surgeon: Irene Shipper, MD;  [facility];  Service: Endoscopy;  Laterality: N/A;  . ESOPHAGOGASTRODUODENOSCOPY    . ESOPHAGOGASTRODUODENOSCOPY N/A 10/19/2015   Procedure: ESOPHAGOGASTRODUODENOSCOPY (EGD);  Surgeon: Gatha Mayer, MD;  Location: [facility];  Service: Endoscopy;  Laterality: N/A;  . GIVENS CAPSULE STUDY  10/21/2015  . GIVENS CAPSULE STUDY N/A 10/21/2015   Procedure: GIVENS CAPSULE STUDY;  Surgeon: Milus Banister, MD;  Location: [facility];  Service: Endoscopy;  Laterality: N/A;  . KNEE SURGERY Right 03-01-2018-03-04-2018   PLATEAU FRACTURE HOSPITLIZATION   . REPAIR RIGHT FEMORAL PSEUDOANEUYSM/  RIGHT FEM-POP BYPASS GRAFT/  DEBRIDEMENT RIGHT LOWER EXTREMITIY VENOUS STATUS ULCERS X2  01-12-2005  . TOTAL ABDOMINAL HYSTERECTOMY W/ BILATERAL SALPINGOOPHORECTOMY  1986  . TRANSTHORACIC ECHOCARDIOGRAM  03/25/2010   EF 53-29%, LV systolic function low normal with mild inferoseptal hypocontractility; LA mildly dilated; mod MR; mild TR, RV systolic pressure elevated, mild pulm HTN; AV mildly sclerotic; mild pulm valve regurg; aortic root sclerosis/calcif     Family History  Problem Relation Age of Onset  . Congestive Heart Failure Mother   . Diabetes Mother   . Stroke Father   . Cancer Maternal Aunt        Breast cancer  Social History:  reports that she quit smoking about 9 years ago. She has a 60.00 pack-year smoking history. She has never used smokeless tobacco. She  reports current alcohol use of about 3.0 standard drinks of alcohol per week. She reports that she does not use drugs.  Review of Systems -    OSTEOPOROSIS: She had Reclast in 2009 No further injections since she has had renal insufficiency  Recent bone density done elsewhere showed she had osteoporosis with T score -3.3 at the left femur on 05/16/2018  She has been recommended Prolia by her treating physician in [facility] and she is waiting for patient assistance program  Hyperlipidemia: Has history of high LDL and triglycerides/low HDL Last LDL below 70 with Crestor 20 mg  Usually has high triglycerides, under 250 However complaining about high cost of rosuvastatin now and asking for change  Lab Results  Component Value Date   CHOL 121 10/04/2017   HDL 29.70 (L) 10/04/2017   LDLCALC 60 05/21/2016   LDLDIRECT 59.0 10/04/2017   TRIG 221.0 (H) 10/04/2017   CHOLHDL 4 10/04/2017     Vitamin D deficiency: She has been continued on supplements and taking her 50,000 units every other week    History of CHF: No hospitalizations since 2017 This has been followed by cardiologist She has a low ejection fraction of 20-25 %  Also she had atrial flutter in 2018    Examination:   There were no vitals taken for this visit.  There is no height or weight on file to calculate BMI.       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 along with history of hydronephrosis:   Creatinine is slightly improved and potassium is not high  OSTEOPOROSIS: This appears to be worsening compared to several years ago when she had weight last and is now being managed at [facility] and she is due to get Prolia after approval from patient assistance Continue vitamin D supplementation also  ANEMIA: Likely from chronic disease and hemoglobin is only slightly low  DIABETES type II on low-dose basal bolus insulin program:  She has only occasional higher readings in the morning but discussed need to check more readings after meals A1c is usually upper normal She will continue the same regimen Needs to have some protein with every meal  HYPOTHYROIDISM:   Well-controlled on stable with 125 mcg levothyroxine and will need follow-up labs  Chronic low back pain: She is taking hydrocodone as needed and will continue this as needed This will not be prescribed From her new PCP  Total visit time with telephone encounter =12 minutes     There are no Patient Instructions on file for this visit.    [name]  06/28/18    Note: This office note was prepared with Dragon voice recognition system technology. Any transcriptional errors that result from this process are unintentional.

## 2018-06-29 ENCOUNTER — Other Ambulatory Visit: Payer: Self-pay

## 2018-06-29 ENCOUNTER — Telehealth: Payer: Self-pay | Admitting: Endocrinology

## 2018-06-29 MED ORDER — CLOPIDOGREL BISULFATE 75 MG PO TABS: 75.0000 mg | ORAL_TABLET | Freq: Every day | ORAL | 0 refills | Status: DC

## 2018-06-29 MED ORDER — CLOPIDOGREL BISULFATE 75 MG PO TABS: 75.0000 mg | ORAL_TABLET | Freq: Every day | ORAL | 3 refills | Status: DC

## 2018-06-29 NOTE — Telephone Encounter (Signed)
Rx sent 

## 2018-06-29 NOTE — Telephone Encounter (Signed)
Patient has called in regards to an appointment she had with Dr.Kumar yesterday and states the pharmacy has not received her Plavix.  Leslie #[id number]   Please Advise, Thanks

## 2018-07-06 ENCOUNTER — Telehealth: Payer: Self-pay | Admitting: Endocrinology

## 2018-07-06 ENCOUNTER — Other Ambulatory Visit: Payer: Self-pay | Admitting: Endocrinology

## 2018-07-06 MED ORDER — HYDROCODONE-ACETAMINOPHEN 5-325 MG PO TABS: 1.0000 | ORAL_TABLET | Freq: Two times a day (BID) | ORAL | 0 refills | Status: DC | PRN

## 2018-07-06 NOTE — Telephone Encounter (Signed)
MEDICATION: Hydrocodone-acetaminophen  PHARMACY:  [facility] [place] [address]   IS THIS A 90 DAY SUPPLY : no  IS PATIENT OUT OF MEDICATION: yes  IF NOT; HOW MUCH IS LEFT:   LAST APPOINTMENT DATE: @4 /23/2020  NEXT APPOINTMENT DATE:@Visit  date not found  DO WE HAVE YOUR PERMISSION TO LEAVE A DETAILED MESSAGE:  OTHER COMMENTS:    **Let patient know to contact pharmacy at the end of the day to make sure medication is ready. **  ** Please notify patient to allow 48-72 hours to process**  **Encourage patient to contact the pharmacy for refills or they can request refills through [facility]**

## 2018-07-06 NOTE — Telephone Encounter (Signed)
Done

## 2018-07-14 ENCOUNTER — Ambulatory Visit: Payer: Medicare Other | Admitting: Family Medicine

## 2018-07-14 DIAGNOSIS — N131 Hydronephrosis with ureteral stricture, not elsewhere classified: Secondary | ICD-10-CM | POA: Diagnosis not present

## 2018-07-14 DIAGNOSIS — R1084 Generalized abdominal pain: Secondary | ICD-10-CM | POA: Diagnosis not present

## 2018-07-14 DIAGNOSIS — R8279 Other abnormal findings on microbiological examination of urine: Secondary | ICD-10-CM | POA: Diagnosis not present

## 2018-07-17 ENCOUNTER — Other Ambulatory Visit: Payer: Self-pay | Admitting: Endocrinology

## 2018-07-17 ENCOUNTER — Other Ambulatory Visit: Payer: Self-pay

## 2018-07-17 ENCOUNTER — Telehealth: Payer: Self-pay | Admitting: Endocrinology

## 2018-07-17 ENCOUNTER — Ambulatory Visit (INDEPENDENT_AMBULATORY_CARE_PROVIDER_SITE_OTHER): Payer: Medicare Other | Admitting: Family Medicine

## 2018-07-17 ENCOUNTER — Encounter: Payer: Self-pay | Admitting: Family Medicine

## 2018-07-17 DIAGNOSIS — E1169 Type 2 diabetes mellitus with other specified complication: Secondary | ICD-10-CM | POA: Diagnosis not present

## 2018-07-17 DIAGNOSIS — E785 Hyperlipidemia, unspecified: Secondary | ICD-10-CM

## 2018-07-17 DIAGNOSIS — I739 Peripheral vascular disease, unspecified: Secondary | ICD-10-CM

## 2018-07-17 DIAGNOSIS — K529 Noninfective gastroenteritis and colitis, unspecified: Secondary | ICD-10-CM | POA: Diagnosis not present

## 2018-07-17 DIAGNOSIS — M159 Polyosteoarthritis, unspecified: Secondary | ICD-10-CM

## 2018-07-17 DIAGNOSIS — I10 Essential (primary) hypertension: Secondary | ICD-10-CM | POA: Diagnosis not present

## 2018-07-17 DIAGNOSIS — E1159 Type 2 diabetes mellitus with other circulatory complications: Secondary | ICD-10-CM | POA: Diagnosis not present

## 2018-07-17 DIAGNOSIS — Z794 Long term (current) use of insulin: Secondary | ICD-10-CM | POA: Diagnosis not present

## 2018-07-17 MED ORDER — FUROSEMIDE 20 MG PO TABS: 20.0000 mg | ORAL_TABLET | Freq: Every day | ORAL | 1 refills | Status: DC

## 2018-07-17 NOTE — Progress Notes (Signed)
Virtual Visit via Telephone Note  I connected with [name] [name] on 07/17/18 at 12:00 PM EDT by telephone and verified that I am speaking with the correct person using two identifiers.   I discussed the limitations, risks, security and privacy concerns of performing an evaluation and management service by telephone and the availability of in person appointments. I also discussed with the patient that there may be a patient responsible charge related to this service. She expressed understanding and agreed to proceed.  Location patient: home Location provider: home office Participants present for the call: patient, provider Patient did not have a visit in the prior 7 days to address this/these issue(s).   History of Present Illness: Ms [name] [name] is a 78 yo female establishing care today.   Former PCP: Dr Dwyane Dee. Last Medicare preventive visit > 1 year ago. She lives alone, her daughter buys her groceries.  She has history of generalized OA, she has been on hydrocodone-acetaminophen 5-325 mg twice daily as needed for many years. Mainly "bad" back pain.  She does not take med daily.  DM2, hypothyroidism, B12 deficiency, and vitamin D deficiency: She follows with Dr. Dwyane Dee. She is on Levemir 10 units daily and NovoLog 4 units twice daily as needed.  Lab Results  Component Value Date   HGBA1C 6.3 (H) 05/19/2018   Lab Results  Component Value Date   TSH 1.40 01/12/2018   Hyperlipidemia, she is on Crestor 20 mg daily. Last time she fill her Crestor, she had to pay $140, so she would like to change medication when she is due for a refill. She follows a low-fat diet. She has tolerated statin medication well.  Lab Results  Component Value Date   CHOL 121 10/04/2017   HDL 29.70 (L) 10/04/2017   LDLCALC 60 05/21/2016   LDLDIRECT 59.0 10/04/2017   TRIG 221.0 (H) 10/04/2017   CHOLHDL 4 10/04/2017    Hypertension, currently she is on Imdur 30 mg daily and metoprolol succinate 25 mg  daily. Denies severe/frequent headache, visual changes, chest pain, dyspnea, palpitation, focal weakness, or edema.  CKD III,she has not noted decreased urine output,gross hematuria,or foam in urine. PAD, she is on Plavix 75 mg daily and Aspirin 81 mg daily. She follows with Dr. Gwenlyn Found.  Diarrhea, she takes Imodium twice daily as needed. She usually takes Imodium if she has 3 or more loose stools per day. Miralax occasionally for constipation. She is not having abdominal pain, changes in bowel habits, nausea, or vomiting. Last colonoscopy 10/21/2015,left side colon diverticula.  GERD, she takes omeprazole 20 mg daily. She tried to discontinue omeprazole but started back with heartburn.   Observations/Objective: Patient sounds cheerful and well on the phone. I do not appreciate any SOB. Speech and thought processing are grossly intact. Patient reported vitals:N/A  BP Readings from Last 3 Encounters:  05/26/18 (!) 166/56  05/19/18 (!) 192/59  02/22/18 134/62    Assessment and Plan:  HTN (hypertension) When reviewing some BPs in the past few months, she had elevated readings in clinic/2020 but in normal range in 02/2018. Elevated BP could be related with stress, she was in the hospital and was undergoing procedures. I cannot see BP readings from urologist visits, she has an appointment next week. For now no changes in current management. Monitor BP regularly if possible. Continue low-salt diet. I will see her back in 3 months.  Diabetes mellitus, type II ([place]) Problem is well controlled. Continue following with Dr. Dwyane Dee.  Generalized osteoarthritis of multiple  sites Pain seems to be well controlled with hydrocodone-acetaminophen 5-325 mg twice daily as needed. We discussed some side effects of medications. She is not sure if Dr. Dwyane Dee will continue filling her prescription, will have discussion in regard to pain management next visit if needed.  PAD (peripheral artery  disease) ([facility]) No changes in Plavix 75 mg daily over the Aspirin 81 mg daily. With some side effect discussed. Continue Crestor 20 mg daily. Continue following with cardiologist.  Hyperlipidemia associated with type 2 diabetes mellitus ([place]) She just got 108-month supply of Crestor 20 mg, so she will continue taking same medication for 3 months. Because of cost of Crestor, we can change to Lipitor 40 mg. Continue low-fat diet.  Chronic diarrhea We discussed some side effects of Imodium. Reporting problem as stable for years. ? IBS. No changes in current management.   Follow Up Instructions:  Follow up in 3 months. Future labs already ordered.   I did not refer this patient for an OV in the next 24 hours for this/these issue(s).  I discussed the assessment and treatment plan with the patient. She was provided an opportunity to ask questions and all were answered. The patient agreed with the plan and demonstrated an understanding of the instructions.    I provided 35 minutes of non-face-to-face time during this encounter.   Ruchel Brandenburger [place], MD

## 2018-07-17 NOTE — Assessment & Plan Note (Signed)
She just got 65-month supply of Crestor 20 mg, so she will continue taking same medication for 3 months. Because of cost of Crestor, we can change to Lipitor 40 mg. Continue low-fat diet.

## 2018-07-17 NOTE — Telephone Encounter (Signed)
Rx sent 

## 2018-07-17 NOTE — Assessment & Plan Note (Signed)
We discussed some side effects of Imodium. Reporting problem as stable for years. ? IBS. No changes in current management.

## 2018-07-17 NOTE — Assessment & Plan Note (Signed)
When reviewing some BPs in the past few months, she had elevated readings in clinic/2020 but in normal range in 02/2018. Elevated BP could be related with stress, she was in the hospital and was undergoing procedures. I cannot see BP readings from urologist visits, she has an appointment next week. For now no changes in current management. Monitor BP regularly if possible. Continue low-salt diet. I will see her back in 3 months.

## 2018-07-17 NOTE — Assessment & Plan Note (Signed)
Problem is well controlled. Continue following with Dr. Dwyane Dee.

## 2018-07-17 NOTE — Assessment & Plan Note (Signed)
No changes in Plavix 75 mg daily over the Aspirin 81 mg daily. With some side effect discussed. Continue Crestor 20 mg daily. Continue following with cardiologist.

## 2018-07-17 NOTE — Telephone Encounter (Signed)
MEDICATION:   furosemide (LASIX) 20 MG tablet    PHARMACY:  [place],  - [address] AT [place]  IS THIS A 90 DAY SUPPLY :   IS PATIENT OUT OF MEDICATION:   IF NOT; HOW MUCH IS LEFT:   LAST APPOINTMENT DATE: @4 /30/2020  NEXT APPOINTMENT DATE:@6 /24/2020  DO WE HAVE YOUR PERMISSION TO LEAVE A DETAILED MESSAGE:  OTHER COMMENTS:    **Let patient know to contact pharmacy at the end of the day to make sure medication is ready. **  ** Please notify patient to allow 48-72 hours to process**  **Encourage patient to contact the pharmacy for refills or they can request refills through [facility]**

## 2018-07-17 NOTE — Assessment & Plan Note (Signed)
Pain seems to be well controlled with hydrocodone-acetaminophen 5-325 mg twice daily as needed. We discussed some side effects of medications. She is not sure if Dr. Dwyane Dee will continue filling her prescription, will have discussion in regard to pain management next visit if needed.

## 2018-07-18 ENCOUNTER — Other Ambulatory Visit: Payer: Self-pay | Admitting: Endocrinology

## 2018-07-20 DIAGNOSIS — N390 Urinary tract infection, site not specified: Secondary | ICD-10-CM | POA: Diagnosis not present

## 2018-07-20 DIAGNOSIS — N135 Crossing vessel and stricture of ureter without hydronephrosis: Secondary | ICD-10-CM | POA: Diagnosis not present

## 2018-07-25 DIAGNOSIS — N1330 Unspecified hydronephrosis: Secondary | ICD-10-CM | POA: Diagnosis not present

## 2018-08-11 ENCOUNTER — Telehealth: Payer: Self-pay | Admitting: Endocrinology

## 2018-08-11 ENCOUNTER — Other Ambulatory Visit: Payer: Self-pay | Admitting: Family Medicine

## 2018-08-11 MED ORDER — HYDROCODONE-ACETAMINOPHEN 5-325 MG PO TABS: 1.0000 | ORAL_TABLET | Freq: Two times a day (BID) | ORAL | 0 refills | Status: DC | PRN

## 2018-08-11 NOTE — Telephone Encounter (Signed)
MEDICATION:   HYDROcodone-acetaminophen (NORCO/VICODIN) 5-325 MG tablet    PHARMACY:   [place], [place] - [address] AT [place] [address] [place] [phone] (Phone) [phone] (Fax)     IS THIS A 90 DAY SUPPLY :  If possible  IS PATIENT OUT OF MEDICATION: No  IF NOT; HOW MUCH IS LEFT: 1 week  LAST APPOINTMENT DATE: @5 /02/2019  NEXT APPOINTMENT DATE:@6 /24/2020  DO WE HAVE YOUR PERMISSION TO LEAVE A DETAILED MESSAGE: Yes  OTHER COMMENTS:    **Let patient know to contact pharmacy at the end of the day to make sure medication is ready. **  ** Please notify patient to allow 48-72 hours to process**  **Encourage patient to contact the pharmacy for refills or they can request refills through [facility]**

## 2018-08-11 NOTE — Telephone Encounter (Signed)
Please refill if appropriate

## 2018-08-11 NOTE — Telephone Encounter (Signed)
I was not asked by pt to continue with pain management, [name] [name] thought she was going to continue following with Dr Dwyane Dee; so this was not discussed in detail during her establish care visit. I am sending to the pharmacy Rx for Hydrocodone-Acetaminophen 5-325 mg bid prn Please advise pt to arrange appt to discuss just pain management before she needs more refills.  Thanks, BJ

## 2018-08-11 NOTE — Telephone Encounter (Signed)
Pain medications will be now filled by PCP as she has been established

## 2018-08-15 NOTE — Telephone Encounter (Signed)
Left detailed message informing patient of directions per Dr. Martinique.

## 2018-08-15 NOTE — Telephone Encounter (Signed)
Patient returning a call to [place]. Attempted to reach office x2, no answer. Please advise.

## 2018-08-15 NOTE — Telephone Encounter (Signed)
Spoke with patient and gave directions per Dr. Martinique. Patient verbalized understanding and stated that she scheduled an appointment for 10/20/2018 with Dr. Martinique.

## 2018-08-16 ENCOUNTER — Encounter: Payer: Self-pay | Admitting: Podiatry

## 2018-08-16 ENCOUNTER — Ambulatory Visit (INDEPENDENT_AMBULATORY_CARE_PROVIDER_SITE_OTHER): Payer: Medicare Other | Admitting: Podiatry

## 2018-08-16 ENCOUNTER — Other Ambulatory Visit: Payer: Self-pay

## 2018-08-16 DIAGNOSIS — L02611 Cutaneous abscess of right foot: Secondary | ICD-10-CM

## 2018-08-16 DIAGNOSIS — L03031 Cellulitis of right toe: Secondary | ICD-10-CM

## 2018-08-16 DIAGNOSIS — M79674 Pain in right toe(s): Secondary | ICD-10-CM | POA: Diagnosis not present

## 2018-08-16 DIAGNOSIS — E1142 Type 2 diabetes mellitus with diabetic polyneuropathy: Secondary | ICD-10-CM

## 2018-08-16 DIAGNOSIS — M79675 Pain in left toe(s): Secondary | ICD-10-CM

## 2018-08-16 DIAGNOSIS — D689 Coagulation defect, unspecified: Secondary | ICD-10-CM

## 2018-08-16 DIAGNOSIS — B351 Tinea unguium: Secondary | ICD-10-CM | POA: Diagnosis not present

## 2018-08-16 NOTE — Progress Notes (Signed)
Complaint:  Visit Type: Patient returns to my office for continued preventative foot care services. Complaint: Patient states" my nails have grown long and thick and become painful to walk and wear shoes" Patient has been diagnosed with DM The patient presents for preventative foot care services. No changes to ROS.  She says the callus under her right big toe joint is  pain-free. Patient has not been seen in over 8 months.  Podiatric Exam: Vascular: dorsalis pedis and posterior tibial pulses are diminished   bilateral. Capillary return is immediate. Temperature gradient is WNL. Skin turgor WNL  Sensorium: Diminished Semmes Weinstein monofilament test. Normal tactile sensation bilaterally. Nail Exam: Pt has thick disfigured discolored nails with subungual debris noted bilateral entire nail hallux through fifth toenails.  Subungual abscess noted left hallux. Ulcer Exam: There is no evidence of ulcer or pre-ulcerative changes or infection. Orthopedic Exam: Muscle tone and strength are WNL. No limitations in general ROM. No crepitus or effusions noted. Foot type and digits show no abnormalities. Bony prominences are unremarkable. Skin: No Porokeratosis. No infection or ulcers  Diagnosis:  Onychomycosis, , Pain in right toe, pain in left toes  Treatment & Plan Procedures and Treatment: Consent by patient was obtained for treatment procedures. The patient understood the discussion of treatment and procedures well. All questions were answered thoroughly reviewed. Debridement of mycotic and hypertrophic toenails, 1 through 5 bilateral and clearing of subungual debris. No ulceration, no infection noted. .  Neosporin /DSD right hallux.  Home soaks were given to this patient.  She was told to soak in only water due to burning from the soap when she soaks in soapy sudsy water. No antibiotics were dispensed.  Return Visit-Office Procedure: Patient instructed to return to the office for a follow up visit 3 months  for continued evaluation and treatment.    Gardiner Barefoot DPM

## 2018-08-21 ENCOUNTER — Telehealth: Payer: Self-pay | Admitting: Podiatry

## 2018-08-21 NOTE — Telephone Encounter (Signed)
Pt toes are still sore from last week. She has been soaking them like instructed but they are still red. Does she need an anti-biotic. Please call patient

## 2018-08-21 NOTE — Telephone Encounter (Signed)
I called pt and asked to give me more information about the toes. Pt states the right great toe in painful at night, but feels better when she dangles it, but the other toes are red too. I asked pt if she ever used epsom salt and she stated yes but not since the toenails had been cut this time. I told pt to use 1/4 cup epsom salt to 1 quart lukewarm water for 15-20 minutes daily and cover with antibiotic ointment and cover with a clean white cotton soak, and I would inform Dr. Prudence Davidson and call again.

## 2018-08-24 ENCOUNTER — Other Ambulatory Visit: Payer: Self-pay | Admitting: Endocrinology

## 2018-08-25 NOTE — Telephone Encounter (Signed)
Rx's filled as ordered

## 2018-08-25 NOTE — Telephone Encounter (Signed)
Do you want to refill of have new PCP refill?

## 2018-08-25 NOTE — Telephone Encounter (Signed)
We can fill the prescription

## 2018-08-28 DIAGNOSIS — S82121D Displaced fracture of lateral condyle of right tibia, subsequent encounter for closed fracture with routine healing: Secondary | ICD-10-CM | POA: Diagnosis not present

## 2018-08-29 NOTE — Progress Notes (Deleted)
Patient ID: [name] [name], female   DOB: 1940-09-24, 78 y.o.   MRN: [id number]   Today's office visit was provided via telemedicine using phone call Explained to the patient and the the limitations of evaluation and management by telemedicine and the availability of in person appointments.  The patient understood the limitations and agreed to proceed. Patient also understood that the telehealth visit is billable. . Location of the patient: Home . Location of the provider: Office Only the patient and myself were participating in the encounter  Detailed review of her records since her last visit in 11/19 was done including up-to-date studies, procedures, consultant notes and labs   Reason for Appointment: Follow-up of various problems  History of Present Illness   ?  TIA  She apparently had an episode where her right hand felt numb and her arm was somewhat heavy and difficult to coordinate This lasted overnight She was told to start Plavix in addition to her 81 mg aspirin but has not done so However has not had any other episodes  She does have 60-79% stenosis of the left carotid artery compared to up to 59% stenosis previously, right internal carotid artery is occluded  RENAL dysfunction/HYPERKALEMIA:  Renal function is variable and this time it has improved slightly She did have another stent for her hydronephrosis in March She does have a mild stable increase in microalbumin but no gross proteinuria  Potassium is upper normal usually She has been told to cut back on high potassium foods and given a list of high potassium foods Diet has not changed   Lab Results  Component Value Date   CREATININE 1.32 (H) 05/19/2018   CREATININE 1.58 (H) 02/24/2018   CREATININE 2.04 (H) 01/12/2018   CREATININE 1.78 (H) 10/03/2017   Lab Results  Component Value Date   K 4.9 05/19/2018      Type 2 DIABETES mellitus, date of diagnosis: 1986.   The insulin regimen is:  Levemir 10 units hs, Novolog 3-4 ac twice a day before meals  Type 2 diabetes  has been treated in the last few years with low dose basal bolus insulin regimen. She has not been taking  any oral hypoglycemic drugs   A1c is relatively higher 6.3 although done from a different lab, previously range 5.4-6.3 and not after meals   Current management and problems:  She has only a few blood sugars when her phone call was made to review her home readings  Recent fasting range 119-132 and dinnertime 166 she has been on a very stable dose of insulin with low doses especially mealtime dose  She is taking her insulin regularly  She thinks her appetite is fairly good She appears to have however lost 7 pounds although this will be checked at home  Side effects from medications: Diarrhea from metformin and nausea and vomiting from GLP-1 drugs   Monitors blood glucose: Less than 1 times a day  Glucometer: One Touch.   Blood sugar readings as above 30-day average 153  Meals: she is usually eating low fat meals especially recently meals usually at 11 AM,and supper at 5 pm.   Calorie intake: Usually controlled. Moderate Carbs Physical activity: exercise: Minimal  Dietician visit: Most recent:, 5/13.   Wt Readings from Last 3 Encounters:  05/26/18 139 lb 6.4 oz (63.2 kg)  05/19/18 139 lb 6.4 oz (63.2 kg)  02/22/18 136 lb (61.7 kg)    Lab Results  Component Value Date   HGBA1C  6.3 (H) 05/19/2018   HGBA1C 5.8 01/12/2018   HGBA1C 5.9 08/10/2017   Lab Results  Component Value Date   MICROALBUR 18.6 (H) 01/12/2018   LDLCALC 60 05/21/2016   CREATININE 1.32 (H) 05/19/2018    ANEMIA:  Has had iron deficiency and anemia of chronic disease, taking iron supplements as before   Lab Results  Component Value Date   WBC 8.6 05/19/2018   HGB 11.6 (L) 05/19/2018   HCT 38.1 05/19/2018   MCV 99.2 05/19/2018   PLT 217 05/19/2018      HYPOTHYROIDISM: She has had long-standing primary  hypothyroidism She is taking Levothyroxine once daily, 125 mcg  She takes levothyroxine consistently in the morning and is taking the iron in the evening No complaints of unusual tiredness recently  Last TSH normal  Lab Results  Component Value Date   TSH 1.40 01/12/2018   TSH 1.03 10/04/2017   TSH 43.69 (H) 08/10/2017   FREET4 0.96 01/12/2018   FREET4 1.44 10/04/2017   FREET4 0.84 08/10/2017    OTHER active problems: See review of systems    Allergies as of 08/30/2018   No Known Allergies     Medication List       Accurate as of August 29, 2018  9:47 PM. If you have any questions, ask your nurse or doctor.        acetaminophen 325 MG tablet Commonly known as: TYLENOL Take 650 mg by mouth every 6 (six) hours as needed for mild pain.   albuterol 108 (90 Base) MCG/ACT inhaler Commonly known as: VENTOLIN HFA INHALE 2 PUFFS INTO THE LUNGS EVERY 6 HOURS AS NEEDED FOR WHEEZING OR SHORTNESS OF BREATH What changed: See the new instructions.   aspirin 81 MG chewable tablet Chew 81 mg by mouth daily.   calcium-vitamin D 500-200 MG-UNIT tablet Commonly known as: OSCAL WITH D Take 1 tablet by mouth every morning.   clopidogrel 75 MG tablet Commonly known as: PLAVIX Take 1 tablet (75 mg total) by mouth daily. Take 1 tablet by mouth once daily.   ferrous sulfate 325 (65 FE) MG tablet Take 325 mg by mouth every evening.   furosemide 20 MG tablet Commonly known as: LASIX TAKE 1 TABLET(20 MG) BY MOUTH DAILY   glucose blood test strip Commonly known as: ONE TOUCH ULTRA TEST USE TO TEST BLOOD SUGAR 1 TO 2 TIMES DAILY AS DIRECTED   HYDROcodone-acetaminophen 5-325 MG tablet Commonly known as: NORCO/VICODIN Take 1 tablet by mouth 2 (two) times daily as needed for moderate pain or severe pain. No more than 50 tabs per month.   Imodium A-D 2 MG tablet Generic drug: loperamide Take 2 mg by mouth as needed for diarrhea or loose stools.   Insulin Pen Needle 32G X 4 MM Misc  Use 4 times daily   isosorbide mononitrate 30 MG 24 hr tablet Commonly known as: IMDUR TAKE 1 TABLET BY MOUTH EVERY DAY   latanoprost 0.005 % ophthalmic solution Commonly known as: XALATAN Place 1 drop into both eyes at bedtime.   Levemir 100 UNIT/ML injection Generic drug: insulin detemir Inject 10 Units into the skin at bedtime.   levothyroxine 125 MCG tablet Commonly known as: SYNTHROID TAKE 1 TABLET(125 MCG) BY MOUTH DAILY   lidocaine 5 % Commonly known as: LIDODERM Place 1 patch onto the skin daily. Remove & Discard patch within 12 hours or as directed by MD What changed:   when to take this  reasons to take this   metoprolol  succinate 25 MG 24 hr tablet Commonly known as: TOPROL-XL TAKE 1 TABLET DAILY   NovoLOG FlexPen 100 UNIT/ML FlexPen Generic drug: insulin aspart INJECT 8 UNITS 3 TIMES A   DAY WITH MEALS What changed: See the new instructions.   omeprazole 20 MG capsule Commonly known as: PRILOSEC Take 1 capsule (20 mg total) by mouth daily.   polyethylene glycol 17 g packet Commonly known as: MIRALAX / GLYCOLAX Take 17 g by mouth daily as needed for moderate constipation.   rosuvastatin 20 MG tablet Commonly known as: CRESTOR TAKE 1 TABLET DAILY   sulfamethoxazole-trimethoprim 400-80 MG tablet Commonly known as: BACTRIM   vitamin B-12 1000 MCG tablet Commonly known as: CYANOCOBALAMIN Take 1,000 mcg by mouth daily.   vitamin C 500 MG tablet Commonly known as: ASCORBIC ACID Take 500 mg by mouth at bedtime.   Vitamin D (Ergocalciferol) 1.25 MG (50000 UT) Caps capsule Commonly known as: DRISDOL TAKE ONE CAPSULE BY MOUTH EVERY 7 DAYS(ON THURSDAYS)       Allergies: No Known Allergies  Past Medical History:  Diagnosis Date  . A-fib ([place])   . AAA (abdominal aortic aneurysm) ([place])   . Bilateral hydronephrosis   . Carotid artery stenosis    carotid doppler 06/2012 - Right CCA/Bulb/ICA with chronic occlusion; L vertebral artery with abnormal  blood flow; L Bulb/Prox ICA  s/p endarterectomy with mild fibrous plaque, 50% diameter reduction  . CHF (congestive heart failure) ([place])   . Choledocholithiasis 2017  . CKD (chronic kidney disease), stage III ([place])   . Coronary artery disease due to lipid rich plaque cardiologist-  dr berry   s/p CABG x6 1997-- cath 12-09-2009 occluded vein to obtuse marginal branch and ramus branch with patent vien to PDA and patent LIMA to LAD, ef 40%-- Myoview 11-24-2011, nonischemic  . Diverticulosis   . Dyspnea on exertion   . GERD (gastroesophageal reflux disease)   . GI bleed   . History of sepsis    10-18-2014 w/ acute pyelonephritis  . Hyperlipidemia   . Hypertension   . Hypothyroidism   . Iron deficiency anemia   . Ischemic cardiomyopathy    03-25-2010-- per lasts echo EF  50-55%  . Ischemic cardiomyopathy   . PAD (peripheral artery disease) ([place])    09/2010 LEAs - R ABI of 0.45, occluded fem-pop bypass graft, L ABI of 0.59 with occluded SFA; severe arterial insuff  . PONV (postoperative nausea and vomiting)   . PVD (peripheral vascular disease) with claudication ([place])    last duplex 07-04-2015 -- Right CCA and ICA chronic occlusion, 54-65% LICA, Patent vertebral arteries w/ antegrade flow, bilateral normal subclavian arteries   . Retroperitoneal fibrosis   . Tubular adenoma of colon   . Type 2 diabetes mellitus ([place])    monitored by dr Dwyane Dee    Past Surgical History:  Procedure Laterality Date  . AORTA - BILATERAL FEMORAL ARTERY BYPASS GRAFT  1997   and RIGHT FEM-POP   . CARDIAC CATHETERIZATION  12-09-2009  dr al little   EF >40%-- occluded vein to OM & ramus branches, patent vein to PDA, patent LIMA to LAD (Dr. [name], [facility]) - later had thrombectomy of R fem-pop bypass ad R common femoral & profunda femoris artery (Dr. Oneida Alar)  . CARDIOVASCULAR STRESS TEST  11-24-2011   dr berry   Low Risk study: fixed basal to mid inferior attenuation artifact, no reversible ischemia,  normal LV  function and wall motion , ef 67%  . CAROTID ENDARTERECTOMY Bilateral  right 1994//  left ?  Marland Kitchen CATARACT EXTRACTION W/ INTRAOCULAR LENS  IMPLANT, BILATERAL  2006  . CHOLECYSTECTOMY N/A 09/05/2015   Procedure: ATTEMPTED LAPAROSCOPIC CHOLECYSTECTOMY, EXPLORATORY LAPAROTOMY WITH CHOLECYSTECTOMY;  Surgeon: Autumn Messing III, MD;  Location: [facility];  Service: General;  Laterality: N/A;  . COLONOSCOPY    . COLONOSCOPY  10/21/2015  . COLONOSCOPY WITH PROPOFOL N/A 10/21/2015   Procedure: COLONOSCOPY WITH PROPOFOL;  Surgeon: Milus Banister, MD;  Location: [facility];  Service: Endoscopy;  Laterality: N/A;  . CORONARY ARTERY BYPASS GRAFT  1997   x6; internal mammary to LAD, SVG to ramus #1 & #2, SVG to OM, SVG to PDA,   . CYSTOSCOPY W/ RETROGRADES Right 08/01/2015   Procedure: CYSTOSCOPY WITH RETROGRADE PYELOGRAM;  Surgeon: Ardis Hughs, MD;  Location: [facility];  Service: Urology;  Laterality: Right;  . CYSTOSCOPY W/ URETERAL STENT PLACEMENT  03/10/2012   Procedure: CYSTOSCOPY WITH RETROGRADE PYELOGRAM/URETERAL STENT PLACEMENT;  Surgeon: Hanley Ben, MD;  Location: [facility];  Service: Urology;  Laterality: Left;  . CYSTOSCOPY W/ URETERAL STENT PLACEMENT Bilateral 03/07/2015   Procedure: BILATERAL RETROGRADE PYELOGRAM AND RIGHT URETERAL STENT PLACEMENT;  Surgeon: Ardis Hughs, MD;  Location: [facility];  Service: Urology;  Laterality: Bilateral;  . CYSTOSCOPY W/ URETERAL STENT PLACEMENT Right 08/01/2015   Procedure: CYSTOSCOPY WITH STENT REPLACEMENT;  Surgeon: Ardis Hughs, MD;  Location: [facility];  Service: Urology;  Laterality: Right;  . CYSTOSCOPY W/ URETERAL STENT PLACEMENT Right 02/13/2016   Procedure: RIGHT URETERAL STENT EXCHANGE;  Surgeon: Ardis Hughs, MD;  Location: [facility];  Service: Urology;  Laterality: Right;  . CYSTOSCOPY W/ URETERAL STENT PLACEMENT Right 09/17/2016   Procedure: CYSTOSCOPY WITH RIGHT  RETROGRADE PYELOGRAM RIGHT  URETERAL STENT EXCHANGE;  Surgeon: Ardis Hughs, MD;  Location: [facility];  Service: Urology;  Laterality: Right;  . CYSTOSCOPY W/ URETERAL STENT PLACEMENT Bilateral 03/18/2017   Procedure: CYSTOSCOPY WITH BILATERAL  RETROGRADE PYELOGRAM RIGHT URETERAL STENT [place];  Surgeon: Ardis Hughs, MD;  Location: [facility];  Service: Urology;  Laterality: Bilateral;  . CYSTOSCOPY W/ URETERAL STENT PLACEMENT Right 05/26/2018   Procedure: CYSTOSCOPY WITH RIGHT RETROGRADE PYELOGRAM RIGHT URETERAL STENT EXCHANGE;  Surgeon: Ardis Hughs, MD;  [facility];  Service: Urology;  Laterality: Right;  . CYSTOSCOPY WITH STENT PLACEMENT Right 10/06/2017   Procedure: CYSTOSCOPY, RETROGRADE  WITH RIGHT STENT EXCHANGE;  Surgeon: Ardis Hughs, MD;  Location[facility];  Service: Urology;  Laterality: Right;  . ERCP N/A 09/03/2015   Procedure: ENDOSCOPIC RETROGRADE CHOLANGIOPANCREATOGRAPHY (ERCP);  Surgeon: Irene Shipper, MD;  Location: [facility] ENDOSCOPY;  Service: Endoscopy;  Laterality: N/A;  . ESOPHAGOGASTRODUODENOSCOPY    . ESOPHAGOGASTRODUODENOSCOPY N/A 10/19/2015   Procedure: ESOPHAGOGASTRODUODENOSCOPY (EGD);  Surgeon: Gatha Mayer, MD;  Location: [facility] ENDOSCOPY;  Service: Endoscopy;  Laterality: N/A;  . GIVENS CAPSULE STUDY  10/21/2015  . GIVENS CAPSULE STUDY N/A 10/21/2015   Procedure: GIVENS CAPSULE STUDY;  Surgeon: Milus Banister, MD;  Location: [place];  Service: Endoscopy;  Laterality: N/A;  . KNEE SURGERY Right 03-01-2018-03-04-2018   PLATEAU FRACTURE HOSPITLIZATION   . REPAIR RIGHT FEMORAL PSEUDOANEUYSM/  RIGHT FEM-POP BYPASS GRAFT/  DEBRIDEMENT RIGHT LOWER EXTREMITIY VENOUS STATUS ULCERS X2  01-12-2005  . TOTAL ABDOMINAL HYSTERECTOMY W/ BILATERAL SALPINGOOPHORECTOMY  1986  . TRANSTHORACIC ECHOCARDIOGRAM  03/25/2010   EF 17-51%, LV systolic function low normal with mild inferoseptal hypocontractility; LA mildly dilated; mod MR; mild TR, RV systolic pressure  elevated, mild pulm HTN;  AV mildly sclerotic; mild pulm valve regurg; aortic root sclerosis/calcif     Family History  Problem Relation Age of Onset  . Congestive Heart Failure Mother   . Diabetes Mother   . Stroke Father   . Cancer Maternal Aunt        Breast cancer    Social History:  reports that she quit smoking about 9 years ago. She has a 60.00 pack-year smoking history. She has never used smokeless tobacco. She reports current alcohol use of about 3.0 standard drinks of alcohol per week. She reports that she does not use drugs.  Review of Systems -    OSTEOPOROSIS: She had Reclast in 2009 No further injections since she has had renal insufficiency  Recent bone density done elsewhere showed she had osteoporosis with T score -3.3 at the left femur on 05/16/2018  She has been recommended Prolia by her treating physician in [facility] and she is waiting for patient assistance program  Hyperlipidemia: Has history of high LDL and triglycerides/low HDL Last LDL below 70 with Crestor 20 mg  Usually has high triglycerides, under 250 However complaining about high cost of rosuvastatin now and asking for change  Lab Results  Component Value Date   CHOL 121 10/04/2017   HDL 29.70 (L) 10/04/2017   LDLCALC 60 05/21/2016   LDLDIRECT 59.0 10/04/2017   TRIG 221.0 (H) 10/04/2017   CHOLHDL 4 10/04/2017     Vitamin D deficiency: She has been continued on supplements and taking her 50,000 units every other week    History of CHF: No hospitalizations since 2017 This has been followed by cardiologist She has a low ejection fraction of 20-25 %  Also she had atrial flutter in 2018    Examination:   There were no vitals taken for this visit.  There is no height or weight on file to calculate BMI.       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 along with history of hydronephrosis:   Creatinine is slightly improved and potassium is not high  OSTEOPOROSIS: This appears to be worsening compared to several years ago when she had weight last and is now being managed at [facility] and she is due to get Prolia after approval from patient assistance Continue vitamin D supplementation also  ANEMIA: Likely from chronic disease and hemoglobin is only slightly low  DIABETES type II on low-dose basal bolus insulin program:  She has only occasional higher readings in the morning but discussed need to check more readings after meals A1c is usually upper normal She will continue the same regimen Needs to have some protein with every meal  HYPOTHYROIDISM:   Well-controlled on stable with 125 mcg levothyroxine and will need follow-up labs  Chronic low back pain: She is taking hydrocodone as needed and will continue this as needed This will not be prescribed From her new PCP  Total visit time  with telephone encounter =12 minutes     There are no Patient Instructions on file for this visit.    [name]  08/29/18    Note: This office note was prepared with Dragon voice recognition system technology. Any transcriptional errors that result from this process are unintentional.

## 2018-08-30 ENCOUNTER — Ambulatory Visit: Payer: Medicare Other | Admitting: Endocrinology

## 2018-08-30 ENCOUNTER — Other Ambulatory Visit: Payer: Self-pay

## 2018-09-01 ENCOUNTER — Other Ambulatory Visit: Payer: Medicare Other

## 2018-09-04 ENCOUNTER — Other Ambulatory Visit (INDEPENDENT_AMBULATORY_CARE_PROVIDER_SITE_OTHER): Payer: Medicare Other

## 2018-09-04 ENCOUNTER — Other Ambulatory Visit: Payer: Self-pay

## 2018-09-04 ENCOUNTER — Ambulatory Visit: Payer: Medicare Other | Admitting: Endocrinology

## 2018-09-04 DIAGNOSIS — E063 Autoimmune thyroiditis: Secondary | ICD-10-CM | POA: Diagnosis not present

## 2018-09-04 DIAGNOSIS — E119 Type 2 diabetes mellitus without complications: Secondary | ICD-10-CM | POA: Diagnosis not present

## 2018-09-04 DIAGNOSIS — E782 Mixed hyperlipidemia: Secondary | ICD-10-CM | POA: Diagnosis not present

## 2018-09-04 DIAGNOSIS — E559 Vitamin D deficiency, unspecified: Secondary | ICD-10-CM | POA: Diagnosis not present

## 2018-09-04 LAB — LIPID PANEL
Cholesterol: 140 mg/dL (ref 0–200)
HDL: 32.7 mg/dL — ABNORMAL LOW (ref >39.00)
NonHDL: 107.09
Total CHOL/HDL Ratio: 4
Triglycerides: 309 mg/dL — ABNORMAL HIGH (ref 0.0–149.0)
VLDL: 61.8 mg/dL — ABNORMAL HIGH (ref 0.0–40.0)

## 2018-09-04 LAB — COMPREHENSIVE METABOLIC PANEL
ALT: 12 U/L (ref 0–35)
AST: 15 U/L (ref 0–37)
Albumin: 3.7 g/dL (ref 3.5–5.2)
Alkaline Phosphatase: 66 U/L (ref 39–117)
BUN: 38 mg/dL — ABNORMAL HIGH (ref 6–23)
CO2: 20 mEq/L (ref 19–32)
Calcium: 9.3 mg/dL (ref 8.4–10.5)
Chloride: 105 mEq/L (ref 96–112)
Creatinine, Ser: 1.6 mg/dL — ABNORMAL HIGH (ref 0.40–1.20)
GFR: 31.22 mL/min — ABNORMAL LOW (ref >60.00)
Glucose, Bld: 149 mg/dL — ABNORMAL HIGH (ref 70–99)
Potassium: 3.8 mEq/L (ref 3.5–5.1)
Sodium: 139 mEq/L (ref 135–145)
Total Bilirubin: 0.3 mg/dL (ref 0.2–1.2)
Total Protein: 6.9 g/dL (ref 6.0–8.3)

## 2018-09-04 LAB — VITAMIN D 25 HYDROXY (VIT D DEFICIENCY, FRACTURES): VITD: 74.45 ng/mL (ref 30.00–100.00)

## 2018-09-04 LAB — TSH: TSH: 0.03 u[IU]/mL — ABNORMAL LOW (ref 0.35–4.50)

## 2018-09-04 LAB — URINALYSIS, ROUTINE W REFLEX MICROSCOPIC
Bilirubin Urine: NEGATIVE
Ketones, ur: NEGATIVE
Nitrite: NEGATIVE
Specific Gravity, Urine: 1.015 (ref 1.000–1.030)
Urine Glucose: NEGATIVE
Urobilinogen, UA: 0.2 (ref 0.0–1.0)
pH: 8.5 — AB (ref 5.0–8.0)

## 2018-09-04 LAB — LDL CHOLESTEROL, DIRECT: Direct LDL: 65 mg/dL

## 2018-09-04 LAB — HEMOGLOBIN A1C: Hgb A1c MFr Bld: 6.5 % (ref 4.6–6.5)

## 2018-09-04 LAB — T4, FREE: Free T4: 1.66 ng/dL — ABNORMAL HIGH (ref 0.60–1.60)

## 2018-09-04 LAB — MICROALBUMIN / CREATININE URINE RATIO
Creatinine,U: 31.7 mg/dL
Microalb Creat Ratio: 32.7 mg/g — ABNORMAL HIGH (ref 0.0–30.0)
Microalb, Ur: 10.4 mg/dL — ABNORMAL HIGH (ref 0.0–1.9)

## 2018-09-06 ENCOUNTER — Other Ambulatory Visit: Payer: Medicare Other

## 2018-09-07 ENCOUNTER — Encounter: Payer: Self-pay | Admitting: Endocrinology

## 2018-09-07 ENCOUNTER — Other Ambulatory Visit: Payer: Self-pay

## 2018-09-07 ENCOUNTER — Ambulatory Visit (INDEPENDENT_AMBULATORY_CARE_PROVIDER_SITE_OTHER): Payer: Medicare Other | Admitting: Endocrinology

## 2018-09-07 DIAGNOSIS — N183 Chronic kidney disease, stage 3 unspecified: Secondary | ICD-10-CM

## 2018-09-07 DIAGNOSIS — E782 Mixed hyperlipidemia: Secondary | ICD-10-CM | POA: Diagnosis not present

## 2018-09-07 DIAGNOSIS — R634 Abnormal weight loss: Secondary | ICD-10-CM | POA: Diagnosis not present

## 2018-09-07 DIAGNOSIS — E119 Type 2 diabetes mellitus without complications: Secondary | ICD-10-CM | POA: Diagnosis not present

## 2018-09-07 DIAGNOSIS — E063 Autoimmune thyroiditis: Secondary | ICD-10-CM | POA: Diagnosis not present

## 2018-09-07 MED ORDER — FENOFIBRATE 54 MG PO TABS: 54.0000 mg | ORAL_TABLET | Freq: Every day | ORAL | 3 refills | Status: DC

## 2018-09-07 MED ORDER — ATORVASTATIN CALCIUM 40 MG PO TABS: 40.0000 mg | ORAL_TABLET | Freq: Every day | ORAL | 3 refills | Status: DC

## 2018-09-07 MED ORDER — LEVOTHYROXINE SODIUM 100 MCG PO TABS: 100.0000 ug | ORAL_TABLET | Freq: Every day | ORAL | 3 refills | Status: DC

## 2018-09-07 NOTE — Progress Notes (Signed)
Patient ID: [name] [name], female   DOB: 1940-03-21, 78 y.o.   MRN: [id number]   Today's office visit was provided via telemedicine using phone call Explained to the patient and the the limitations of evaluation and management by telemedicine and the availability of in person appointments.  The patient understood the limitations and agreed to proceed. Patient also understood that the telehealth visit is billable. . Location of the patient: Home . Location of the provider: Office Only the patient and myself were participating in the encounter  Detailed review of her records since her last visit in 11/19 was done including up-to-date studies, procedures, consultant notes and labs   Reason for Appointment: Follow-up of various problems  History of Present Illness    RENAL dysfunction/HYPERKALEMIA:  Renal function is variable and likely multifactorial including obstructive uropathy She does get a stent replaced every 6 months by urologist Creatinine is slightly worse compared to 3/20 She does have a mild stable increase in microalbumin but no gross proteinuria  Potassium is upper normal but better now  She has been told to cut back on high potassium foods and given a list of high potassium foods Not on diuretics   Lab Results  Component Value Date   CREATININE 1.60 (H) 09/04/2018   CREATININE 1.32 (H) 05/19/2018   CREATININE 1.58 (H) 02/24/2018   CREATININE 2.04 (H) 01/12/2018   Lab Results  Component Value Date   K 3.8 09/04/2018      Type 2 DIABETES mellitus, date of diagnosis: 1986.   The insulin regimen is: Levemir 10 units hs, Novolog 3-4 ac twice a day before meals  Type 2 diabetes  has been treated in the last few years with low dose basal bolus insulin regimen. She has not been taking  any oral hypoglycemic drugs   A1c is 6.5 This is higher than usual, previously range 5.4-6.3   Current management and problems:  She has continued her Levemir  and NovoLog as before  Usually taking NovoLog before eating and may adjust it based on her meal size  Recently fasting appears to be generally higher, fasting lab 149  However checking her blood sugars mostly in the morning hours, occasionally after breakfast  However her diet is variable in content and quantity  She is less active because of back pain Continues to lose weight recently and she is not sure why  Side effects from medications: Diarrhea from metformin and nausea and vomiting from GLP-1 drugs   Monitors blood glucose: Less than 1 times a day, recent readings below  Glucometer: One Touch.    PRE-MEAL  morning Lunch Dinner Bedtime Overall  Glucose range:  125-147      Mean/median:     ?   POST-MEAL  late morning PC Lunch PC Dinner  Glucose range:  133-163    Mean/median:        Meals: she is usually eating low fat meals especially recently meals usually at 11 AM,and supper at 5 pm.   Calorie intake: Usually controlled. Moderate Carbs Physical activity: exercise: Minimal  Dietician visit: Most recent:, 5/13.   Wt Readings from Last 3 Encounters:  05/26/18 139 lb 6.4 oz (63.2 kg)  05/19/18 139 lb 6.4 oz (63.2 kg)  02/22/18 136 lb (61.7 kg)    Lab Results  Component Value Date   HGBA1C 6.5 09/04/2018   HGBA1C 6.3 (H) 05/19/2018   HGBA1C 5.8 01/12/2018   Lab Results  Component Value Date   MICROALBUR  10.4 (H) 09/04/2018   LDLCALC 60 05/21/2016   CREATININE 1.60 (H) 09/04/2018    HYPOTHYROIDISM: She has had long-standing primary hypothyroidism She is taking Levothyroxine once daily, 125 mcg  She takes levothyroxine consistently in the morning She takes iron in the evening Has lost weight for other reasons but she does not feel palpitations or shakiness No unusual fatigue  Last TSH normal but is now suppressed with a high free T4  Lab Results  Component Value Date   TSH 0.03 (L) 09/04/2018   TSH 1.40 01/12/2018   TSH 1.03 10/04/2017   FREET4  1.66 (H) 09/04/2018   FREET4 0.96 01/12/2018   FREET4 1.44 10/04/2017    OTHER active problems addressed today: See review of systems    Allergies as of 09/07/2018   No Known Allergies     Medication List       Accurate as of September 07, 2018  3:48 PM. If you have any questions, ask your nurse or doctor.        acetaminophen 325 MG tablet Commonly known as: TYLENOL Take 650 mg by mouth every 6 (six) hours as needed for mild pain.   albuterol 108 (90 Base) MCG/ACT inhaler Commonly known as: VENTOLIN HFA INHALE 2 PUFFS INTO THE LUNGS EVERY 6 HOURS AS NEEDED FOR WHEEZING OR SHORTNESS OF BREATH What changed: See the new instructions.   aspirin 81 MG chewable tablet Chew 81 mg by mouth daily.   calcium-vitamin D 500-200 MG-UNIT tablet Commonly known as: OSCAL WITH D Take 1 tablet by mouth every morning.   clopidogrel 75 MG tablet Commonly known as: PLAVIX Take 1 tablet (75 mg total) by mouth daily. Take 1 tablet by mouth once daily.   ferrous sulfate 325 (65 FE) MG tablet Take 325 mg by mouth every evening.   furosemide 20 MG tablet Commonly known as: LASIX TAKE 1 TABLET(20 MG) BY MOUTH DAILY   glucose blood test strip Commonly known as: ONE TOUCH ULTRA TEST USE TO TEST BLOOD SUGAR 1 TO 2 TIMES DAILY AS DIRECTED   HYDROcodone-acetaminophen 5-325 MG tablet Commonly known as: NORCO/VICODIN Take 1 tablet by mouth 2 (two) times daily as needed for moderate pain or severe pain. No more than 50 tabs per month.   Imodium A-D 2 MG tablet Generic drug: loperamide Take 2 mg by mouth as needed for diarrhea or loose stools.   Insulin Pen Needle 32G X 4 MM Misc Use 4 times daily   isosorbide mononitrate 30 MG 24 hr tablet Commonly known as: IMDUR TAKE 1 TABLET BY MOUTH EVERY DAY   latanoprost 0.005 % ophthalmic solution Commonly known as: XALATAN Place 1 drop into both eyes at bedtime.   Levemir 100 UNIT/ML injection Generic drug: insulin detemir Inject 10 Units into  the skin at bedtime. Inject 10 units under the skin at bedtime.   levothyroxine 125 MCG tablet Commonly known as: SYNTHROID TAKE 1 TABLET(125 MCG) BY MOUTH DAILY   lidocaine 5 % Commonly known as: LIDODERM Place 1 patch onto the skin daily. Remove & Discard patch within 12 hours or as directed by MD What changed:   when to take this  reasons to take this   metoprolol succinate 25 MG 24 hr tablet Commonly known as: TOPROL-XL TAKE 1 TABLET DAILY   NovoLOG FlexPen 100 UNIT/ML FlexPen Generic drug: insulin aspart INJECT 8 UNITS 3 TIMES A   DAY WITH MEALS What changed: See the new instructions.   omeprazole 20 MG capsule Commonly known  as: PRILOSEC Take 1 capsule (20 mg total) by mouth daily.   polyethylene glycol 17 g packet Commonly known as: MIRALAX / GLYCOLAX Take 17 g by mouth daily as needed for moderate constipation.   rosuvastatin 20 MG tablet Commonly known as: CRESTOR TAKE 1 TABLET DAILY   sulfamethoxazole-trimethoprim 400-80 MG tablet Commonly known as: BACTRIM   vitamin B-12 1000 MCG tablet Commonly known as: CYANOCOBALAMIN Take 1,000 mcg by mouth daily.   vitamin C 500 MG tablet Commonly known as: ASCORBIC ACID Take 500 mg by mouth at bedtime.   Vitamin D (Ergocalciferol) 1.25 MG (50000 UT) Caps capsule Commonly known as: DRISDOL TAKE ONE CAPSULE BY MOUTH EVERY 7 DAYS(ON THURSDAYS)       Allergies: No Known Allergies  Past Medical History:  Diagnosis Date  . A-fib ([place])   . AAA (abdominal aortic aneurysm) ([place])   . Bilateral hydronephrosis   . Carotid artery stenosis    carotid doppler 06/2012 - Right CCA/Bulb/ICA with chronic occlusion; L vertebral artery with abnormal blood flow; L Bulb/Prox ICA  s/p endarterectomy with mild fibrous plaque, 50% diameter reduction  . CHF (congestive heart failure) ([place])   . Choledocholithiasis 2017  . CKD (chronic kidney disease), stage III ([place])   . Coronary artery disease due to lipid rich plaque  cardiologist-  dr berry   s/p CABG x6 1997-- cath 12-09-2009 occluded vein to obtuse marginal branch and ramus branch with patent vien to PDA and patent LIMA to LAD, ef 40%-- Myoview 11-24-2011, nonischemic  . Diverticulosis   . Dyspnea on exertion   . GERD (gastroesophageal reflux disease)   . GI bleed   . History of sepsis    10-18-2014 w/ acute pyelonephritis  . Hyperlipidemia   . Hypertension   . Hypothyroidism   . Iron deficiency anemia   . Ischemic cardiomyopathy    03-25-2010-- per lasts echo EF  50-55%  . Ischemic cardiomyopathy   . PAD (peripheral artery disease) ([place])    09/2010 LEAs - R ABI of 0.45, occluded fem-pop bypass graft, L ABI of 0.59 with occluded SFA; severe arterial insuff  . PONV (postoperative nausea and vomiting)   . PVD (peripheral vascular disease) with claudication ([place])    last duplex 07-04-2015 -- Right CCA and ICA chronic occlusion, 40-08% LICA, Patent vertebral arteries w/ antegrade flow, bilateral normal subclavian arteries   . Retroperitoneal fibrosis   . Tubular adenoma of colon   . Type 2 diabetes mellitus ([place])    monitored by dr Dwyane Dee    Past Surgical History:  Procedure Laterality Date  . AORTA - BILATERAL FEMORAL ARTERY BYPASS GRAFT  1997   and RIGHT FEM-POP   . CARDIAC CATHETERIZATION  12-09-2009  dr al little   EF >40%-- occluded vein to OM & ramus branches, patent vein to PDA, patent LIMA to LAD (Dr. [name], [facility]) - later had thrombectomy of R fem-pop bypass ad R common femoral & profunda femoris artery (Dr. Oneida Alar)  . CARDIOVASCULAR STRESS TEST  11-24-2011   dr berry   Low Risk study: fixed basal to mid inferior attenuation artifact, no reversible ischemia,  normal LV function and wall motion , ef 67%  . CAROTID ENDARTERECTOMY Bilateral right 1994//  left ?  Marland Kitchen CATARACT EXTRACTION W/ INTRAOCULAR LENS  IMPLANT, BILATERAL  2006  . CHOLECYSTECTOMY N/A 09/05/2015   Procedure: ATTEMPTED LAPAROSCOPIC CHOLECYSTECTOMY, EXPLORATORY LAPAROTOMY WITH  CHOLECYSTECTOMY;  Surgeon: Autumn Messing III, MD;  Location: [place];  Service: General;  Laterality: N/A;  .  COLONOSCOPY    . COLONOSCOPY  10/21/2015  . COLONOSCOPY WITH PROPOFOL N/A 10/21/2015   Procedure: COLONOSCOPY WITH PROPOFOL;  Surgeon: Milus Banister, MD;  Location: [place];  Service: Endoscopy;  Laterality: N/A;  . CORONARY ARTERY BYPASS GRAFT  1997   x6; internal mammary to LAD, SVG to ramus #1 & #2, SVG to OM, SVG to PDA,   . CYSTOSCOPY W/ RETROGRADES Right 08/01/2015   Procedure: CYSTOSCOPY WITH RETROGRADE PYELOGRAM;  Surgeon: Ardis Hughs, MD;  Location: [facility];  Service: Urology;  Laterality: Right;  . CYSTOSCOPY W/ URETERAL STENT PLACEMENT  03/10/2012   Procedure: CYSTOSCOPY WITH RETROGRADE PYELOGRAM/URETERAL STENT PLACEMENT;  Surgeon: Hanley Ben, MD;  Location: [facility];  Service: Urology;  Laterality: Left;  . CYSTOSCOPY W/ URETERAL STENT PLACEMENT Bilateral 03/07/2015   Procedure: BILATERAL RETROGRADE PYELOGRAM AND RIGHT URETERAL STENT PLACEMENT;  Surgeon: Ardis Hughs, MD;  [facility];  Service: Urology;  Laterality: Bilateral;  . CYSTOSCOPY W/ URETERAL STENT PLACEMENT Right 08/01/2015   Procedure: CYSTOSCOPY WITH STENT REPLACEMENT;  Surgeon: Ardis Hughs, MD;  Location: [facility];  Service: Urology;  Laterality: Right;  . CYSTOSCOPY W/ URETERAL STENT PLACEMENT Right 02/13/2016   Procedure: RIGHT URETERAL STENT EXCHANGE;  Surgeon: Ardis Hughs, MD;  Location: [facility];  Service: Urology;  Laterality: Right;  . CYSTOSCOPY W/ URETERAL STENT PLACEMENT Right 09/17/2016   Procedure: CYSTOSCOPY WITH RIGHT  RETROGRADE PYELOGRAM RIGHT URETERAL STENT EXCHANGE;  Surgeon: Ardis Hughs, MD;  Location: [facility];  Service: Urology;  Laterality: Right;  . CYSTOSCOPY W/ URETERAL STENT PLACEMENT Bilateral 03/18/2017   Procedure: CYSTOSCOPY WITH BILATERAL  RETROGRADE PYELOGRAM RIGHT URETERAL STENT  [place];  Surgeon: Ardis Hughs, MD;  Location: [facility];  Service: Urology;  Laterality: Bilateral;  . CYSTOSCOPY W/ URETERAL STENT PLACEMENT Right 05/26/2018   Procedure: CYSTOSCOPY WITH RIGHT RETROGRADE PYELOGRAM RIGHT URETERAL STENT EXCHANGE;  Surgeon: Ardis Hughs, MD;  Location: [facility];  Service: Urology;  Laterality: Right;  . CYSTOSCOPY WITH STENT PLACEMENT Right 10/06/2017   Procedure: CYSTOSCOPY, RETROGRADE  WITH RIGHT STENT EXCHANGE;  Surgeon: Ardis Hughs, MD;  Location: [facility];  Service: Urology;  Laterality: Right;  . ERCP N/A 09/03/2015   Procedure: ENDOSCOPIC RETROGRADE CHOLANGIOPANCREATOGRAPHY (ERCP);  Surgeon: Irene Shipper, MD;  Location: [facility];  Service: Endoscopy;  Laterality: N/A;  . ESOPHAGOGASTRODUODENOSCOPY    . ESOPHAGOGASTRODUODENOSCOPY N/A 10/19/2015   Procedure: ESOPHAGOGASTRODUODENOSCOPY (EGD);  Surgeon: Gatha Mayer, MD;  Location: [facility]COPY;  Service: Endoscopy;  Laterality: N/A;  . GIVENS CAPSULE STUDY  10/21/2015  . GIVENS CAPSULE STUDY N/A 10/21/2015   Procedure: GIVENS CAPSULE STUDY;  Surgeon: Milus Banister, MD;  Location: [place];  Service: Endoscopy;  Laterality: N/A;  . KNEE SURGERY Right 03-01-2018-03-04-2018   PLATEAU FRACTURE HOSPITLIZATION   . REPAIR RIGHT FEMORAL PSEUDOANEUYSM/  RIGHT FEM-POP BYPASS GRAFT/  DEBRIDEMENT RIGHT LOWER EXTREMITIY VENOUS STATUS ULCERS X2  01-12-2005  . TOTAL ABDOMINAL HYSTERECTOMY W/ BILATERAL SALPINGOOPHORECTOMY  1986  . TRANSTHORACIC ECHOCARDIOGRAM  03/25/2010   EF 13-08%, LV systolic function low normal with mild inferoseptal hypocontractility; LA mildly dilated; mod MR; mild TR, RV systolic pressure elevated, mild pulm HTN; AV mildly sclerotic; mild pulm valve regurg; aortic root sclerosis/calcif     Family History  Problem Relation Age of Onset  . Congestive Heart Failure Mother   . Diabetes Mother   . Stroke Father   . Cancer Maternal Aunt  Breast cancer    Social  History:  reports that she quit smoking about 9 years ago. She has a 60.00 pack-year smoking history. She has never used smokeless tobacco. She reports current alcohol use of about 3.0 standard drinks of alcohol per week. She reports that she does not use drugs.  Review of Systems -    OSTEOPOROSIS: She had Reclast in 2009 No further injections since she has had renal insufficiency  Recent bone density done elsewhere showed she had osteoporosis with T score -3.3 at the left femur on 05/16/2018  She has been recommended Prolia by her treating physician in [facility] She does not know if she has been approved for patient assistance program to cover the cost  Hyperlipidemia: Has history of high LDL and triglycerides/low HDL  LDL below 70 with Crestor 20 mg  Usually has high triglycerides, now over 300.  She has been good on her diet but previously had been on fenofibrate which she is not taking She is again wanting to switch to Lipitor instead of Crestor because of higher cost as recommended by her insurance   Lab Results  Component Value Date   CHOL 140 09/04/2018   HDL 32.70 (L) 09/04/2018   LDLCALC 60 05/21/2016   LDLDIRECT 65.0 09/04/2018   TRIG 309.0 (H) 09/04/2018   CHOLHDL 4 09/04/2018     Vitamin D deficiency: She has been continued on supplements and taking her 50,000 units every other week    History of CHF: No hospitalizations since 2017 This has been followed by cardiologist She has a low ejection fraction of 20-25 %  Also she had atrial flutter in 2018  History of TIA: Previous episode where her right hand felt numb and her arm was somewhat heavy and difficult to coordinate This lasted overnight She has been on Plavix in addition to her 81 mg aspirin  However has not had any more episodes  Carotid Dopplers: 60-79% stenosis of the left carotid artery compared to up to 59% stenosis previously, right internal carotid artery is occluded   ANEMIA:  Has had iron  deficiency and anemia of chronic disease, taking iron supplements as before Hemoglobin stable  CBC Latest Ref Rng & Units 05/19/2018 10/03/2017 08/10/2017  WBC 4.0 - 10.5 K/uL 8.6 9.8 7.6  Hemoglobin 12.0 - 15.0 g/dL 11.6(L) 12.1 11.9(L)  Hematocrit 36.0 - 46.0 % 38.1 37.9 35.4(L)  Platelets 150 - 400 K/uL 217 246 183.0      Examination:   There were no vitals taken for this visit.  There is no height or weight on file to calculate BMI.       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 along with history of hydronephrosis:   Creatinine is slightly higher than and appears to be fluctuating  OSTEOPOROSIS: She will contact her treating physician at [facility] to see if she is approved for the patient assistance for Prolia  ANEMIA: Likely from chronic disease and hemoglobin stable in the past  DIABETES type II on low-dose basal bolus insulin program:  Her blood sugars are mostly higher fasting A1c is usually upper normal but relatively higher now  She will need to increase her caloric intake because of her weight loss and she can use nutritional supplements OTC  also In the meantime increase Levemir to 12 units  HYPOTHYROIDISM:   Previously stable with 125 mcg levothyroxine Currently her TSH is significantly suppressed and free T4 is high, currently asymptomatic This may be contributing to weight loss and will change her to 100 mcg daily Will need follow-up labs in about 2 months  Chronic low back pain: She is going to be seen by another physician next week for pain management  Lipids: Triglycerides are significantly high even though LDL is controlled with rosuvastatin 20 mg She does not have dietary factors causing high  triglycerides We will go ahead and start her on low-dose fenofibrate compatible with her renal function Also at her request we will try Lipitor 40 mg daily for lower cost  Total visit time with telephone encounter = 16 minutes     There are no Patient Instructions on file for this visit.    [name]  09/07/18    Note: This office note was prepared with Dragon voice recognition system technology. Any transcriptional errors that result from this process are unintentional.

## 2018-09-12 ENCOUNTER — Telehealth: Payer: Self-pay | Admitting: Endocrinology

## 2018-09-12 NOTE — Telephone Encounter (Signed)
Pt stated that when she was in rehab for her knee, she was instructed to stop taking metoprolol. Pt would like to know if you would like her to continue taking it or stop taking.  Pt stated that BP has been stable without taking this medication.

## 2018-09-12 NOTE — Telephone Encounter (Signed)
Patient requests that Olen Cordial call her at ph# [phone] re: does Dr. Dwyane Dee want patient to continue taking metoprolol succinate (TOPROL-XL) 25 MG 24 hr tablet

## 2018-09-12 NOTE — Telephone Encounter (Signed)
She will have to discuss with her cardiologist and not clear why she was told to stop metoprolol

## 2018-09-13 DIAGNOSIS — M47816 Spondylosis without myelopathy or radiculopathy, lumbar region: Secondary | ICD-10-CM | POA: Diagnosis not present

## 2018-09-13 NOTE — Telephone Encounter (Signed)
Called pt and gave her MD message. Pt verbalized understanding. 

## 2018-09-14 ENCOUNTER — Telehealth: Payer: Self-pay | Admitting: Family Medicine

## 2018-09-14 NOTE — Telephone Encounter (Signed)
Message sent to Dr. Jordan for review and approval. 

## 2018-09-14 NOTE — Telephone Encounter (Signed)
Medication Refill - Medication: HYDROcodone-acetaminophen (NORCO/VICODIN) 5-325 MG tablet    Has the patient contacted their pharmacy? No. (Agent: If no, request that the patient contact the pharmacy for the refill.) (Agent: If yes, when and what did the pharmacy advise?)  Preferred Pharmacy (with phone number or street name):  [facility] [place] [place] - [address]  [place] [address]  Phone: [phone] Fax: [phone]  Not a 24 hour pharmacy; exact hours not known.     Agent: Please be advised that RX refills may take up to 3 business days. We ask that you follow-up with your pharmacy.

## 2018-09-15 ENCOUNTER — Other Ambulatory Visit: Payer: Self-pay

## 2018-09-15 MED ORDER — OMEPRAZOLE 20 MG PO CPDR: 20.0000 mg | DELAYED_RELEASE_CAPSULE | Freq: Every day | ORAL | 1 refills | Status: DC

## 2018-09-15 NOTE — Telephone Encounter (Signed)
Patient is call to check on the status of her medication refill. Patient was advised that medication refills can take up to 3 business days. Thank you

## 2018-09-18 ENCOUNTER — Other Ambulatory Visit: Payer: Self-pay | Admitting: Family Medicine

## 2018-09-18 MED ORDER — HYDROCODONE-ACETAMINOPHEN 5-325 MG PO TABS: 1.0000 | ORAL_TABLET | Freq: Two times a day (BID) | ORAL | 0 refills | Status: DC | PRN

## 2018-09-18 NOTE — Telephone Encounter (Signed)
Rx was sent to her pharmacy.  Thanks, BJ 

## 2018-09-29 DIAGNOSIS — S82121D Displaced fracture of lateral condyle of right tibia, subsequent encounter for closed fracture with routine healing: Secondary | ICD-10-CM | POA: Diagnosis not present

## 2018-09-29 DIAGNOSIS — Z7189 Other specified counseling: Secondary | ICD-10-CM | POA: Diagnosis not present

## 2018-09-29 DIAGNOSIS — M810 Age-related osteoporosis without current pathological fracture: Secondary | ICD-10-CM | POA: Diagnosis not present

## 2018-10-11 ENCOUNTER — Telehealth: Payer: Self-pay | Admitting: Endocrinology

## 2018-10-11 DIAGNOSIS — N135 Crossing vessel and stricture of ureter without hydronephrosis: Secondary | ICD-10-CM | POA: Diagnosis not present

## 2018-10-11 DIAGNOSIS — N1330 Unspecified hydronephrosis: Secondary | ICD-10-CM | POA: Diagnosis not present

## 2018-10-11 DIAGNOSIS — R102 Pelvic and perineal pain: Secondary | ICD-10-CM | POA: Diagnosis not present

## 2018-10-11 NOTE — Telephone Encounter (Signed)
Called pt's son again and line was busy at this time.

## 2018-10-11 NOTE — Telephone Encounter (Signed)
She needs to be calling her urologist for the most appropriate treatment

## 2018-10-11 NOTE — Telephone Encounter (Signed)
Patient called to advise that she has a kidney infection and that she needs Dr Dwyane Dee to call something in for her.  Her number for call back is [phone].

## 2018-10-11 NOTE — Telephone Encounter (Signed)
Disregard message below.  Spoke with pt and she stated that she contacted her Urologist already and she has an appointment this afternoon.

## 2018-10-20 ENCOUNTER — Encounter: Payer: Self-pay | Admitting: Family Medicine

## 2018-10-20 ENCOUNTER — Other Ambulatory Visit: Payer: Self-pay

## 2018-10-20 ENCOUNTER — Ambulatory Visit (INDEPENDENT_AMBULATORY_CARE_PROVIDER_SITE_OTHER): Payer: Medicare Other | Admitting: Family Medicine

## 2018-10-20 VITALS — BP 130/76 | HR 74 | Temp 98.5°F | Resp 16 | Ht 66.0 in | Wt 127.5 lb

## 2018-10-20 DIAGNOSIS — M159 Polyosteoarthritis, unspecified: Secondary | ICD-10-CM

## 2018-10-20 DIAGNOSIS — N390 Urinary tract infection, site not specified: Secondary | ICD-10-CM | POA: Diagnosis not present

## 2018-10-20 DIAGNOSIS — I739 Peripheral vascular disease, unspecified: Secondary | ICD-10-CM | POA: Diagnosis not present

## 2018-10-20 DIAGNOSIS — R102 Pelvic and perineal pain: Secondary | ICD-10-CM | POA: Diagnosis not present

## 2018-10-20 DIAGNOSIS — Z79891 Long term (current) use of opiate analgesic: Secondary | ICD-10-CM | POA: Insufficient documentation

## 2018-10-20 DIAGNOSIS — N135 Crossing vessel and stricture of ureter without hydronephrosis: Secondary | ICD-10-CM | POA: Diagnosis not present

## 2018-10-20 DIAGNOSIS — I10 Essential (primary) hypertension: Secondary | ICD-10-CM | POA: Diagnosis not present

## 2018-10-20 MED ORDER — HYDROCODONE-ACETAMINOPHEN 5-325 MG PO TABS: 1.0000 | ORAL_TABLET | Freq: Two times a day (BID) | ORAL | 0 refills | Status: DC | PRN

## 2018-10-20 NOTE — Patient Instructions (Addendum)
A few things to remember from today's visit:   Essential hypertension  PAD (peripheral artery disease) ([place])  After reviewing records, metoprolol was discontinued because of low heart rate. You are taking Plavix for peripheral artery disease (no for coronary artery disease), so you do not have to resume metoprolol at this time. Continue monitoring blood pressure.  Please be sure medication list is accurate. If a new problem present, please set up appointment sooner than planned today.

## 2018-10-20 NOTE — Progress Notes (Signed)
HPI:   [name] [name] is a 78 y.o. female, who is here today with her daughter for chronic pain management.  She was last seen (virtually) on 07/17/18. Since her last visit she has [facility].  Generalized OA and back pain. She has been on Hydrocodone-Acetaminophen 5-325 mg for many years. She has tolerated medications well and it is helping with pain. She takes medication bid as needed.  Unstable gait,she uses a cane and walker at home. Problem has been worse since right knee tibial plateau fracture , 02/2018. Conservative management. Completed PT. Following with ortho periodically.  Back pain and left knee pain can be as bad as 10/10 when walking. Alleviated by rest (2-3/10).  Wheel chair to go for appts . She is also concerned because Metoprolol Tartrate was discontinue after hospital discharge. She was living with her daughter and now is living alone. She is not checking BP's at home. Denies severe/frequent headache, visual changes, chest pain, dyspnea, palpitation, or edema. She is on PLavix 75 mg daily because Hx of PAD.  Last carotis Doplex 02/2018: Right Carotid: Velocities in the right ICA are consistent with a total  occlusion. The CCA appears occluded. Left Carotid: Velocities in the left ICA are consistent with a 60-79% stenosis. Non-hemodynamically significant plaque noted in the CCA. Vertebrals:  Right vertebral artery demonstrates antegrade flow. Left vertebral  artery appears occluded with possible collateral flow visualized. Subclavians: Normal flow hemodynamics were seen in bilateral subclavian arteries.  ABI 10/2017: Right: Resting right ankle-brachial index indicates noncompressible right lower extremity arteries.The right toe-brachial index is abnormal. ABIs are unreliable. Left: Resting left ankle-brachial index indicates noncompressible left lower extremity arteries.The left toe-brachial index is abnormal.  Also on  Atorvastatin 40 mg daily. Negative for Hx of CAD.   Review of Systems  Constitutional: Negative for chills and fever.  HENT: Negative for mouth sores and sore throat.   Respiratory: Negative for cough and wheezing.   Gastrointestinal: Negative for constipation (bowel movements every 2 days.).  Genitourinary: Negative for dysuria and hematuria.  Musculoskeletal: Positive for gait problem. Negative for joint swelling.  Neurological: Negative for syncope and headaches.  Psychiatric/Behavioral: Negative for confusion.  Rest see pertinent positives and negatives per HPI.   Current Outpatient Medications on File Prior to Visit  Medication Sig Dispense Refill   Abaloparatide (TYMLOS) 3120 MCG/1.56ML SOPN Inject into the skin.     acetaminophen (TYLENOL) 325 MG tablet Take 650 mg by mouth every 6 (six) hours as needed for mild pain.     albuterol (PROVENTIL HFA;VENTOLIN HFA) 108 (90 Base) MCG/ACT inhaler INHALE 2 PUFFS INTO THE LUNGS EVERY 6 HOURS AS NEEDED FOR WHEEZING OR SHORTNESS OF BREATH (Patient taking differently: Inhale 2 puffs into the lungs every 6 (six) hours as needed for wheezing or shortness of breath. ) 54 g 0   aspirin 81 MG chewable tablet Chew 81 mg by mouth daily.      atorvastatin (LIPITOR) 40 MG tablet Take 1 tablet (40 mg total) by mouth daily. 90 tablet 3   calcium-vitamin D (OSCAL WITH D) 500-200 MG-UNIT per tablet Take 1 tablet by mouth every morning.     clopidogrel (PLAVIX) 75 MG tablet Take 1 tablet (75 mg total) by mouth daily. Take 1 tablet by mouth once daily. 90 tablet 0   fenofibrate 54 MG tablet Take 1 tablet (54 mg total) by mouth daily. 90 tablet 3   ferrous sulfate 325 (65 FE) MG tablet  Take 325 mg by mouth every evening.      furosemide (LASIX) 20 MG tablet TAKE 1 TABLET(20 MG) BY MOUTH DAILY 90 tablet 0   glucose blood (ONE TOUCH ULTRA TEST) test strip USE TO TEST BLOOD SUGAR 1 TO 2 TIMES DAILY AS DIRECTED 100 each 2   insulin detemir (LEVEMIR)  100 UNIT/ML injection Inject 10 Units into the skin at bedtime. Inject 10 units under the skin at bedtime.     Insulin Pen Needle 32G X 4 MM MISC Use 4 times daily 400 each 1   isosorbide mononitrate (IMDUR) 30 MG 24 hr tablet TAKE 1 TABLET BY MOUTH EVERY DAY 90 tablet 3   latanoprost (XALATAN) 0.005 % ophthalmic solution Place 1 drop into both eyes at bedtime.  0   levothyroxine (SYNTHROID) 100 MCG tablet Take 1 tablet (100 mcg total) by mouth daily. 90 tablet 3   lidocaine (LIDODERM) 5 % Place 1 patch onto the skin daily. Remove & Discard patch within 12 hours or as directed by MD (Patient taking differently: Place 1 patch onto the skin daily as needed (pain). Remove & Discard patch within 12 hours or as directed by MD) 30 patch 0   loperamide (IMODIUM A-D) 2 MG tablet Take 2 mg by mouth as needed for diarrhea or loose stools.      NOVOLOG FLEXPEN 100 UNIT/ML FlexPen INJECT 8 UNITS 3 TIMES A   DAY WITH MEALS (Patient taking differently: Inject 4 Units into the skin 2 (two) times daily as needed for high blood sugar. ) 30 mL 3   omeprazole (PRILOSEC) 20 MG capsule Take 1 capsule (20 mg total) by mouth daily. 90 capsule 1   polyethylene glycol (MIRALAX / GLYCOLAX) packet Take 17 g by mouth daily as needed for moderate constipation. 14 each 0   sulfamethoxazole-trimethoprim (BACTRIM) 400-80 MG tablet      vitamin B-12 (CYANOCOBALAMIN) 1000 MCG tablet Take 1,000 mcg by mouth daily.     vitamin C (ASCORBIC ACID) 500 MG tablet Take 500 mg by mouth at bedtime.      No current facility-administered medications on file prior to visit.      Past Medical History:  Diagnosis Date   A-fib [facility])    AAA (abdominal aortic aneurysm) ([place])    Bilateral hydronephrosis    Carotid artery stenosis    carotid doppler 06/2012 - Right CCA/Bulb/ICA with chronic occlusion; L vertebral artery with abnormal blood flow; L Bulb/Prox ICA  s/p endarterectomy with mild fibrous plaque, 50% diameter reduction     CHF (congestive heart failure) ([place])    Choledocholithiasis 2017   CKD (chronic kidney disease), stage III ([place])    Coronary artery disease due to lipid rich plaque cardiologist-  dr berry   s/p CABG x6 1997-- cath 12-09-2009 occluded vein to obtuse marginal branch and ramus branch with patent vien to PDA and patent LIMA to LAD, ef 40%-- Myoview 11-24-2011, nonischemic   Diverticulosis    Dyspnea on exertion    GERD (gastroesophageal reflux disease)    GI bleed    History of sepsis    10-18-2014 w/ acute pyelonephritis   Hyperlipidemia    Hypertension    Hypothyroidism    Iron deficiency anemia    Ischemic cardiomyopathy    03-25-2010-- per lasts echo EF  50-55%   Ischemic cardiomyopathy    PAD (peripheral artery disease) ([place])    09/2010 LEAs - R ABI of 0.45, occluded fem-pop bypass graft, L ABI of 0.59 with  occluded SFA; severe arterial insuff   PONV (postoperative nausea and vomiting)    PVD (peripheral vascular disease) with claudication ([place])    last duplex 07-04-2015 -- Right CCA and ICA chronic occlusion, 86-76% LICA, Patent vertebral arteries w/ antegrade flow, bilateral normal subclavian arteries    Retroperitoneal fibrosis    Tubular adenoma of colon    Type 2 diabetes mellitus ([place])    monitored by dr Dwyane Dee   No Known Allergies  Social History   Socioeconomic History   Marital status: Divorced    Spouse name: Not on file   Number of children: 1   Years of education: 12   Highest education level: Not on file  Occupational History   Not on file  Social Needs   Financial resource strain: Not on file   Food insecurity    Worry: Not on file    Inability: Not on file   Transportation needs    Medical: Not on file    Non-medical: Not on file  Tobacco Use   Smoking status: Former Smoker    Packs/day: 2.00    Years: 30.00    Pack years: 60.00    Quit date: 06/07/2009    Years since quitting: 9.3   Smokeless tobacco: Never Used   Substance and Sexual Activity   Alcohol use: Yes    Alcohol/week: 3.0 standard drinks    Types: 3 Standard drinks or equivalent per week    Comment: ocassional   Drug use: No   Sexual activity: Not on file  Lifestyle   Physical activity    Days per week: Not on file    Minutes per session: Not on file   Stress: Not on file  Relationships   Social connections    Talks on phone: Not on file    Gets together: Not on file    Attends religious service: Not on file    Active member of club or organization: Not on file    Attends meetings of clubs or organizations: Not on file    Relationship status: Not on file  Other Topics Concern   Not on file  Social History Narrative   Lives alone   Worked 44 yrs [employer]   1 daughter    Vitals:   10/20/18 1426  BP: 130/76  Pulse: 74  Resp: 16  Temp: 98.5 F (36.9 C)  SpO2: 94%   Body mass index is 20.58 kg/m.   Physical Exam  Nursing note and vitals reviewed. Constitutional: She is oriented to person, place, and time. She appears well-developed and well-nourished. No distress.  HENT:  Head: Normocephalic and atraumatic.  Mouth/Throat: Oropharynx is clear and moist and mucous membranes are normal.  Eyes: Pupils are equal, round, and reactive to light. Conjunctivae are normal.  Cardiovascular: Normal rate and regular rhythm.  No murmur heard. DP pulses present.  Respiratory: Effort normal and breath sounds normal. No respiratory distress.  GI: Soft. There is no abdominal tenderness.  Musculoskeletal:        General: No edema.     Comments: RLE muscle atrophy.  Lymphadenopathy:    She has no cervical adenopathy.  Neurological: She is alert and oriented to person, place, and time. She has normal strength. No cranial nerve deficit.  In a wheel chair.  Skin: Skin is warm. No rash noted. No erythema.  Psychiatric: She has a normal mood and affect.  Well groomed, good eye contact.    ASSESSMENT AND  PLAN:  Shakyra was seen today for follow-up.  Diagnoses and all orders for this visit:  Essential hypertension BP adequately controlled today. Apparently metoprolol was discontinued at SNF because episode of bradycardia. BP is adequately controlled. Continue current management. Recommend monitoring BP periodically. Continue low salt diet.  PAD (peripheral artery disease) (HCC) Appropriate foot care,avoidance of trauma. Last carotid US 02/2018. ABI scheduled for 11/2018 Continue Plavix and Pravastatin.  Generalized osteoarthritis of multiple sites Side effects of opioid meds discussed. Fall precautions discussed. Pain seems to be well controlled with Hydrocodone-Acetaminophen, no chnages in current management.  -     HYDROcodone-acetaminophen (NORCO/VICODIN) 5-325 MG tablet; Take 1 tablet by mouth 2 (two) times daily as needed for moderate pain or severe pain.  Chronic prescription opiate use Current guidelines of chronic pain management with controlled meds discussed. [place] controlled substance reviewed. Med contract signed today.  -     HYDROcodone-acetaminophen (NORCO/VICODIN) 5-325 MG tablet; Take 1 tablet by mouth 2 (two) times daily as needed for moderate pain or severe pain.  Other orders -     Discontinue: HYDROcodone-acetaminophen (NORCO/VICODIN) 5-325 MG tablet; Take 1 tablet by mouth 2 (two) times daily as needed for moderate pain or severe pain.  .  Return in about 4 months (around 02/19/2019) for 4-5 months for pain management.    -Ms. Kandee Escalante Pruss was advised to return sooner than planned today if new concerns arise.    Emma Schupp G. [place], MD  [facility]. [place] office.

## 2018-10-23 ENCOUNTER — Other Ambulatory Visit: Payer: Self-pay | Admitting: Endocrinology

## 2018-10-26 ENCOUNTER — Encounter (HOSPITAL_COMMUNITY): Payer: Medicare Other

## 2018-10-30 DIAGNOSIS — N135 Crossing vessel and stricture of ureter without hydronephrosis: Secondary | ICD-10-CM | POA: Diagnosis not present

## 2018-10-30 DIAGNOSIS — N390 Urinary tract infection, site not specified: Secondary | ICD-10-CM | POA: Diagnosis not present

## 2018-11-01 ENCOUNTER — Telehealth: Payer: Self-pay | Admitting: Cardiovascular Disease

## 2018-11-01 ENCOUNTER — Other Ambulatory Visit: Payer: Self-pay | Admitting: Urology

## 2018-11-01 NOTE — Telephone Encounter (Signed)
° ° °  ° °   [facility] Pre-operative Risk Assessment    Request for surgical clearance:  What type of surgery is being performed? CYSTOSCOPY WITH STENT PLACEMENT  When is this surgery scheduled? 12/01/98 1. What type of clearance is required (medical clearance vs. Pharmacy clearance to hold med vs. Both)? both  2. Are there any medications that need to be held prior to surgery and how long? Plavix  3. Practice name and name of physician performing surgery? Dr [name] [name]   4. What is your office phone number [phone] ext [phone]   7.   What is your office fax number [phone]  8.   Anesthesia type (None, local, MAC, general) ? general   [name] [name] 11/01/2018, 12:49 PM  _________________________________________________________________   (provider comments below)

## 2018-11-01 NOTE — Telephone Encounter (Signed)
Dr. Gwenlyn Found, This patient has a history of CAD s/p remote CABG and multiple interventions for PAD (fem-pop bypass graft occluded, occluded SFA, and hx of bilateral carotid endarterectomies. No recent intervention in the last year. We received a request to hold plavix for cystoscopy with stent placement. Can you please give your recommendations?

## 2018-11-01 NOTE — Telephone Encounter (Signed)
Spoke with the patient. She is scheduled for vascular testing at [facility] on 11/28/18. She will need to get out on time so that she can get to the COVID test site by 3pm.(Needed for her upcoming procedure with Dr. Louis Meckel). Adv the patient that the test site [facility] is 5 min away from the [facility] office. Her test are scheduled at [facility] from 8-11am. Adv her that should allow plenty of time for her to get to [facility] before their cut off time. Patient verbalized understanding and voiced appreciation for the call back

## 2018-11-01 NOTE — Telephone Encounter (Signed)
  Patient has some questions about the procedure she is having done on 11/28/18 and would like to speak to the nurse.

## 2018-11-02 NOTE — Telephone Encounter (Signed)
Okay to hold Plavix for her procedure.

## 2018-11-03 NOTE — Telephone Encounter (Signed)
Left message for the patient to call back and speak to the on call preop APP for clearance.

## 2018-11-07 NOTE — Telephone Encounter (Signed)
Called patient and left another message for patient to call back and ask to speak with pre-op APP.   Of note, patient has history of bilateral carotid artery stenosis s/p bilateral CEAs. Most recent carotid ultrasounds in 02/2018 showed totally occluded right ICA and 60-79% stenosis of left ICA. Patient had repeat ultrasounds scheduled for 11/28/2018 prior to cystoscopy but it looks like patient cancelled this. May be good idea for patient to have these repeat studies prior to procedure.

## 2018-11-07 NOTE — Telephone Encounter (Signed)
Dr. Gwenlyn Found,  I called this patient as part of pre-op protocol and sounds like she is doing well from a cardiac standpoint. No chest pain, shortness of breath, lightheadedness, or dizziness. She did mention having to stop and rest when climbing stabs or walking up hill but sounds like this is due to back pain not shortness of breath or chest pain. However, last Echo was in 04/2016 and EF mildly reduced at 40-45% at that time. Procedure (cystoscopy) is low risk and and she has had this procedure multiple times  with last time being 05/2018. I think she is probably okay without any additional work-up. But given her history just wanted to double check with you to see if you feel like she needs repeat Echo as well as repeat carotid ultrasounds given significant carotid disease (she had this scheduled for 11/28/2018 but cancelled it) prior to procedure on 12/01/2018?   Of note, she states she does not want to do any additional test prior to procedure unless absolutely necessary because she has to dry herself to the office and that makes her nervous.  Please let route response back to P [facility] PREOP.  Thanks so much! Roey Coopman

## 2018-11-08 NOTE — Telephone Encounter (Signed)
I called patient and explained no need for any cardiovascular testing prior to procedure.  She is happy with that and grateful for the call.

## 2018-11-08 NOTE — Telephone Encounter (Signed)
   Primary Cardiologist:Jonathan Gwenlyn Found, MD  Chart reviewed as part of pre-operative protocol coverage. Pre-op clearance already addressed by colleagues in earlier phone notes. To summarize recommendations:  Chart reviewed as part of pre-operative protocol coverage. Patient was contacted 11/07/2018 by Sande Rives, PA in reference to pre-operative risk assessment for pending surgery as outlined below.  [name] [name] was last seen on 02/22/18 by Dr. Gwenlyn Found.  Since that day, [name] [name] has done well. She has some activity intolerance such as climbing stairs or walking uphill, mostly due to back pain and not shortness of breath or chest pain.  She does have carotid artery disease but no recent stroke symptoms.  Therefore, based on ACC/AHA guidelines, the patient would be at acceptable risk for the planned procedure without further cardiovascular testing.   Per Dr. Gwenlyn Found, okay to hold Plavix as needed for procedure.  Resume after procedure once okay with surgeon.  Will route this bundled recommendation to requesting provider via Epic fax function. Please call with questions.  [name], NP 11/08/2018, 8:43 AM

## 2018-11-08 NOTE — Telephone Encounter (Signed)
Follow up   Patient states that she is returning call because it was mentioned that she may need an ultrasound before procedure. Please advise.

## 2018-11-08 NOTE — Telephone Encounter (Signed)
I agree [facility], she can have her cystoscopy without any additional studies prior. It's a low risk procedure as you point out

## 2018-11-09 ENCOUNTER — Other Ambulatory Visit (INDEPENDENT_AMBULATORY_CARE_PROVIDER_SITE_OTHER): Payer: Medicare Other

## 2018-11-09 ENCOUNTER — Other Ambulatory Visit: Payer: Self-pay

## 2018-11-09 DIAGNOSIS — E782 Mixed hyperlipidemia: Secondary | ICD-10-CM

## 2018-11-09 DIAGNOSIS — E063 Autoimmune thyroiditis: Secondary | ICD-10-CM

## 2018-11-09 LAB — COMPREHENSIVE METABOLIC PANEL
ALT: 11 U/L (ref 0–35)
AST: 17 U/L (ref 0–37)
Albumin: 3.3 g/dL — ABNORMAL LOW (ref 3.5–5.2)
Alkaline Phosphatase: 54 U/L (ref 39–117)
BUN: 64 mg/dL — ABNORMAL HIGH (ref 6–23)
CO2: 22 mEq/L (ref 19–32)
Calcium: 10 mg/dL (ref 8.4–10.5)
Chloride: 106 mEq/L (ref 96–112)
Creatinine, Ser: 2.54 mg/dL — ABNORMAL HIGH (ref 0.40–1.20)
GFR: 18.31 mL/min — ABNORMAL LOW (ref >60.00)
Glucose, Bld: 134 mg/dL — ABNORMAL HIGH (ref 70–99)
Potassium: 4.6 mEq/L (ref 3.5–5.1)
Sodium: 137 mEq/L (ref 135–145)
Total Bilirubin: 0.3 mg/dL (ref 0.2–1.2)
Total Protein: 6.5 g/dL (ref 6.0–8.3)

## 2018-11-09 LAB — T4, FREE: Free T4: 0.95 ng/dL (ref 0.60–1.60)

## 2018-11-09 LAB — LIPID PANEL
Cholesterol: 154 mg/dL (ref 0–200)
HDL: 33.5 mg/dL — ABNORMAL LOW (ref >39.00)
NonHDL: 120.19
Total CHOL/HDL Ratio: 5
Triglycerides: 269 mg/dL — ABNORMAL HIGH (ref 0.0–149.0)
VLDL: 53.8 mg/dL — ABNORMAL HIGH (ref 0.0–40.0)

## 2018-11-09 LAB — LDL CHOLESTEROL, DIRECT: Direct LDL: 85 mg/dL

## 2018-11-09 LAB — TSH: TSH: 6.73 u[IU]/mL — ABNORMAL HIGH (ref 0.35–4.50)

## 2018-11-15 ENCOUNTER — Other Ambulatory Visit: Payer: Self-pay

## 2018-11-15 ENCOUNTER — Encounter: Payer: Self-pay | Admitting: Endocrinology

## 2018-11-15 ENCOUNTER — Ambulatory Visit (INDEPENDENT_AMBULATORY_CARE_PROVIDER_SITE_OTHER): Payer: Medicare Other | Admitting: Endocrinology

## 2018-11-15 DIAGNOSIS — N183 Chronic kidney disease, stage 3 unspecified: Secondary | ICD-10-CM

## 2018-11-15 DIAGNOSIS — E782 Mixed hyperlipidemia: Secondary | ICD-10-CM | POA: Diagnosis not present

## 2018-11-15 DIAGNOSIS — E441 Mild protein-calorie malnutrition: Secondary | ICD-10-CM | POA: Diagnosis not present

## 2018-11-15 DIAGNOSIS — E063 Autoimmune thyroiditis: Secondary | ICD-10-CM

## 2018-11-15 DIAGNOSIS — E119 Type 2 diabetes mellitus without complications: Secondary | ICD-10-CM

## 2018-11-15 MED ORDER — ATORVASTATIN CALCIUM 80 MG PO TABS: 80.0000 mg | ORAL_TABLET | Freq: Every day | ORAL | 1 refills | Status: DC

## 2018-11-15 NOTE — Patient Instructions (Signed)
INSULIN doses: May increase NovoLog to 5 units when eating a larger meal in the evening  Diet: Avoid high fat dairy products such as butter, cream and baked goods made from butter Avoid fatty meats Start using protein supplements such as diabetic boost or any protein shake with low sugar content  Levothyroxine: Take extra half a pill once a week  Stop fenofibrate Start taking 2 tablets of 40 mg atorvastatin, new prescription for 80 mg has been sent to [facility]  Please have your urologist send [place] lab reports for kidney function after your stent procedure has been done  Please let us know if you want to transfer treatment for your osteoporosis once the medication is approved

## 2018-11-15 NOTE — Progress Notes (Signed)
Patient ID: [name] [name], female   DOB: [birth year], 78 y.o.   MRN: [id number]   Today's office visit was provided via telemedicine using phone call Explained to the patient and the the limitations of evaluation and management by telemedicine and the availability of in person appointments.  The patient understood the limitations and agreed to proceed. Patient also understood that the telehealth visit is billable. . Location of the patient: Home . Location of the provider: Office Only the patient and myself were participating in the encounter    Reason for Appointment: Follow-up of various problems  History of Present Illness    Chronic kidney disease  Renal function is variable and appears to be multifactorial including obstructive uropathy She does get a stent replaced every 6 months by urologist, due to be done this month Creatinine is significantly worse compared to 08/2018 She does have a mild stable increase in microalbumin but no gross proteinuria as of 6/20  Potassium is more consistently normal  She has been told to cut back on high potassium foods and given a list of high potassium foods   Lab Results  Component Value Date   CREATININE 2.54 (H) 11/09/2018   CREATININE 1.60 (H) 09/04/2018   CREATININE 1.32 (H) 05/19/2018   CREATININE 1.58 (H) 02/24/2018   Lab Results  Component Value Date   K 4.6 11/09/2018      Type 2 DIABETES mellitus, date of diagnosis: 1986.   The insulin regimen is: Levemir 12 units hs, Novolog 3-4 ac twice a day before meals  Type 2 diabetes  has been treated in the last few years with low dose basal bolus insulin regimen. She has not been taking  any oral hypoglycemic drugs   A1c is last 6.5   Current management and problems:  She has increased her Levemir to 12 units since her last visit  She will have occasional higher readings after meals based on what she is eating  Sometimes may have sweets like cookies   Otherwise checking blood sugar somewhat infrequently after meals  Her weight is slightly better, at home she has gained 3 pounds since her last visit  Activity level is limited by various factors She takes her insulin doses regularly  Side effects from medications: Diarrhea from metformin and nausea and vomiting from GLP-1 drugs   Monitors blood glucose: Less than 1 times a day, recent readings below  Glucometer: One Touch.    PRE-MEAL  morning Lunch Dinner Bedtime Overall  Glucose range:  125-147      Mean/median:     ?   POST-MEAL  late morning PC Lunch PC Dinner  Glucose range:  133-163    Mean/median:        Meals:  usually at 11 AM,and supper at 5 pm.    Physical activity: exercise: Minimal  Dietician visit: Most recent:, 5/13.   Wt Readings from Last 3 Encounters:  10/20/18 127 lb 8 oz (57.8 kg)  05/26/18 139 lb 6.4 oz (63.2 kg)  05/19/18 139 lb 6.4 oz (63.2 kg)    Lab Results  Component Value Date   HGBA1C 6.5 09/04/2018   HGBA1C 6.3 (H) 05/19/2018   HGBA1C 5.8 01/12/2018   Lab Results  Component Value Date   MICROALBUR 10.4 (H) 09/04/2018   LDLCALC 60 05/21/2016   CREATININE 2.54 (H) 11/09/2018    HYPOTHYROIDISM: She has had long-standing primary hypothyroidism She is taking Levothyroxine once daily, now 100 mcg Dose was reduced because of  subclinical hyperthyroidism in June  She does tend to be sleepy but she thinks this is from not doing much activity No cold intolerance Her weight has improved according to patient  She takes levothyroxine daily before breakfast She takes iron in the evening  TSH is now mildly increased at 6.7  Lab Results  Component Value Date   TSH 6.73 (H) 11/09/2018   TSH 0.03 (L) 09/04/2018   TSH 1.40 01/12/2018   FREET4 0.95 11/09/2018   FREET4 1.66 (H) 09/04/2018   FREET4 0.96 01/12/2018    OTHER active problems addressed today: See review of systems    Allergies as of 11/15/2018   No Known Allergies      Medication List       Accurate as of November 15, 2018  9:25 AM. If you have any questions, ask your nurse or doctor.        acetaminophen 325 MG tablet Commonly known as: TYLENOL Take 650 mg by mouth every 6 (six) hours as needed for mild pain.   albuterol 108 (90 Base) MCG/ACT inhaler Commonly known as: VENTOLIN HFA INHALE 2 PUFFS INTO THE LUNGS EVERY 6 HOURS AS NEEDED FOR WHEEZING OR SHORTNESS OF BREATH What changed: See the new instructions.   aspirin 81 MG chewable tablet Chew 81 mg by mouth daily.   atorvastatin 80 MG tablet Commonly known as: LIPITOR Take 1 tablet (80 mg total) by mouth daily. What changed:   medication strength  how much to take Changed by: [name], MD   calcium-vitamin D 500-200 MG-UNIT tablet Commonly known as: OSCAL WITH D Take 1 tablet by mouth every morning.   clopidogrel 75 MG tablet Commonly known as: PLAVIX Take 1 tablet (75 mg total) by mouth daily. Take 1 tablet by mouth once daily.   fenofibrate 54 MG tablet Take 1 tablet (54 mg total) by mouth daily.   ferrous sulfate 325 (65 FE) MG tablet Take 325 mg by mouth every evening.   furosemide 20 MG tablet Commonly known as: LASIX TAKE 1 TABLET(20 MG) BY MOUTH DAILY   glucose blood test strip Commonly known as: ONE TOUCH ULTRA TEST USE TO TEST BLOOD SUGAR 1 TO 2 TIMES DAILY AS DIRECTED   HYDROcodone-acetaminophen 5-325 MG tablet Commonly known as: NORCO/VICODIN Take 1 tablet by mouth 2 (two) times daily as needed for moderate pain or severe pain.   Imodium A-D 2 MG tablet Generic drug: loperamide Take 2 mg by mouth as needed for diarrhea or loose stools.   Insulin Pen Needle 32G X 4 MM Misc Use 4 times daily   isosorbide mononitrate 30 MG 24 hr tablet Commonly known as: IMDUR TAKE 1 TABLET BY MOUTH EVERY DAY   latanoprost 0.005 % ophthalmic solution Commonly known as: XALATAN Place 1 drop into both eyes at bedtime.   Levemir 100 UNIT/ML injection Generic drug:  insulin detemir Inject 12 Units into the skin at bedtime. Inject 12 units under the skin at bedtime.   levothyroxine 100 MCG tablet Commonly known as: SYNTHROID Take 1 tablet (100 mcg total) by mouth daily.   lidocaine 5 % Commonly known as: LIDODERM Place 1 patch onto the skin daily. Remove & Discard patch within 12 hours or as directed by MD What changed:   when to take this  reasons to take this   NovoLOG FlexPen 100 UNIT/ML FlexPen Generic drug: insulin aspart INJECT 8 UNITS 3 TIMES A   DAY WITH MEALS What changed: See the new instructions.   omeprazole  20 MG capsule Commonly known as: PRILOSEC Take 1 capsule (20 mg total) by mouth daily.   polyethylene glycol 17 g packet Commonly known as: MIRALAX / GLYCOLAX Take 17 g by mouth daily as needed for moderate constipation.   sulfamethoxazole-trimethoprim 400-80 MG tablet Commonly known as: BACTRIM   Tymlos 3120 MCG/1.56ML Sopn Generic drug: Abaloparatide Inject into the skin.   vitamin B-12 1000 MCG tablet Commonly known as: CYANOCOBALAMIN Take 1,000 mcg by mouth daily.   vitamin C 500 MG tablet Commonly known as: ASCORBIC ACID Take 500 mg by mouth at bedtime.   Vitamin D (Ergocalciferol) 1.25 MG (50000 UT) Caps capsule Commonly known as: DRISDOL TAKE ONE CAPSULE BY MOUTH EVERY 7 DAYS(ON THURSDAYS)       Allergies: No Known Allergies  Past Medical History:  Diagnosis Date  . A-fib ([place])   . AAA (abdominal aortic aneurysm) ([place])   . Bilateral hydronephrosis   . Carotid artery stenosis    carotid doppler 06/2012 - Right CCA/Bulb/ICA with chronic occlusion; L vertebral artery with abnormal blood flow; L Bulb/Prox ICA  s/p endarterectomy with mild fibrous plaque, 50% diameter reduction  . CHF (congestive heart failure) ([place])   . Choledocholithiasis 2017  . CKD (chronic kidney disease), stage III ([place])   . Coronary artery disease due to lipid rich plaque cardiologist-  dr berry   s/p CABG x6 1997-- cath  12-09-2009 occluded vein to obtuse marginal branch and ramus branch with patent vien to PDA and patent LIMA to LAD, ef 40%-- Myoview 11-24-2011, nonischemic  . Diverticulosis   . Dyspnea on exertion   . GERD (gastroesophageal reflux disease)   . GI bleed   . History of sepsis    10-18-2014 w/ acute pyelonephritis  . Hyperlipidemia   . Hypertension   . Hypothyroidism   . Iron deficiency anemia   . Ischemic cardiomyopathy    03-25-2010-- per lasts echo EF  50-55%  . Ischemic cardiomyopathy   . PAD (peripheral artery disease) ([place])    09/2010 LEAs - R ABI of 0.45, occluded fem-pop bypass graft, L ABI of 0.59 with occluded SFA; severe arterial insuff  . PONV (postoperative nausea and vomiting)   . PVD (peripheral vascular disease) with claudication ([place])    last duplex 07-04-2015 -- Right CCA and ICA chronic occlusion, 00-86% LICA, Patent vertebral arteries w/ antegrade flow, bilateral normal subclavian arteries   . Retroperitoneal fibrosis   . Tubular adenoma of colon   . Type 2 diabetes mellitus ([place])    monitored by dr Dwyane Dee    Past Surgical History:  Procedure Laterality Date  . AORTA - BILATERAL FEMORAL ARTERY BYPASS GRAFT  1997   and RIGHT FEM-POP   . CARDIAC CATHETERIZATION  12-09-2009  dr al little   EF >40%-- occluded vein to OM & ramus branches, patent vein to PDA, patent LIMA to LAD (Dr. [name], [facility]) - later had thrombectomy of R fem-pop bypass ad R common femoral & profunda femoris artery (Dr. Oneida Alar)  . CARDIOVASCULAR STRESS TEST  11-24-2011   dr berry   Low Risk study: fixed basal to mid inferior attenuation artifact, no reversible ischemia,  normal LV function and wall motion , ef 67%  . CAROTID ENDARTERECTOMY Bilateral right 1994//  left ?  Marland Kitchen CATARACT EXTRACTION W/ INTRAOCULAR LENS  IMPLANT, BILATERAL  2006  . CHOLECYSTECTOMY N/A 09/05/2015   Procedure: ATTEMPTED LAPAROSCOPIC CHOLECYSTECTOMY, EXPLORATORY LAPAROTOMY WITH CHOLECYSTECTOMY;  Surgeon: Autumn Messing III, MD;   Location: [place];  Service: General;  Laterality: N/A;  . COLONOSCOPY    . COLONOSCOPY  10/21/2015  . COLONOSCOPY WITH PROPOFOL N/A 10/21/2015   Procedure: COLONOSCOPY WITH PROPOFOL;  Surgeon: Milus Banister, MD;  Location: [place];  Service: Endoscopy;  Laterality: N/A;  . CORONARY ARTERY BYPASS GRAFT  1997   x6; internal mammary to LAD, SVG to ramus #1 & #2, SVG to OM, SVG to PDA,   . CYSTOSCOPY W/ RETROGRADES Right 08/01/2015   Procedure: CYSTOSCOPY WITH RETROGRADE PYELOGRAM;  Surgeon: Ardis Hughs, MD;  Location: [facility];  Service: Urology;  Laterality: Right;  . CYSTOSCOPY W/ URETERAL STENT PLACEMENT  03/10/2012   Procedure: CYSTOSCOPY WITH RETROGRADE PYELOGRAM/URETERAL STENT PLACEMENT;  Surgeon: Hanley Ben, MD;  Location: [facility];  Service: Urology;  Laterality: Left;  . CYSTOSCOPY W/ URETERAL STENT PLACEMENT Bilateral 03/07/2015   Procedure: BILATERAL RETROGRADE PYELOGRAM AND RIGHT URETERAL STENT PLACEMENT;  Surgeon: Ardis Hughs, MD;  [facility];  Service: Urology;  Laterality: Bilateral;  . CYSTOSCOPY W/ URETERAL STENT PLACEMENT Right 08/01/2015   Procedure: CYSTOSCOPY WITH STENT REPLACEMENT;  Surgeon: Ardis Hughs, MD;  Location: [facility];  Service: Urology;  Laterality: Right;  . CYSTOSCOPY W/ URETERAL STENT PLACEMENT Right 02/13/2016   Procedure: RIGHT URETERAL STENT EXCHANGE;  Surgeon: Ardis Hughs, MD;  Location: [facility];  Service: Urology;  Laterality: Right;  . CYSTOSCOPY W/ URETERAL STENT PLACEMENT Right 09/17/2016   Procedure: CYSTOSCOPY WITH RIGHT  RETROGRADE PYELOGRAM RIGHT URETERAL STENT EXCHANGE;  Surgeon: Ardis Hughs, MD;  Location: [facility];  Service: Urology;  Laterality: Right;  . CYSTOSCOPY W/ URETERAL STENT PLACEMENT Bilateral 03/18/2017   Procedure: CYSTOSCOPY WITH BILATERAL  RETROGRADE PYELOGRAM RIGHT URETERAL STENT [place];  Surgeon: Ardis Hughs, MD;   Location: [facility];  Service: Urology;  Laterality: Bilateral;  . CYSTOSCOPY W/ URETERAL STENT PLACEMENT Right 05/26/2018   Procedure: CYSTOSCOPY WITH RIGHT RETROGRADE PYELOGRAM RIGHT URETERAL STENT EXCHANGE;  Surgeon: Ardis Hughs, MD;  Location: [facility];  Service: Urology;  Laterality: Right;  . CYSTOSCOPY WITH STENT PLACEMENT Right 10/06/2017   Procedure: CYSTOSCOPY, RETROGRADE  WITH RIGHT STENT EXCHANGE;  Surgeon: Ardis Hughs, MD;  Location[facility];  Service: Urology;  Laterality: Right;  . ERCP N/A 09/03/2015   Procedure: ENDOSCOPIC RETROGRADE CHOLANGIOPANCREATOGRAPHY (ERCP);  Surgeon: Irene Shipper, MD;  Location: [facility];  Service: Endoscopy;  Laterality: N/A;  . ESOPHAGOGASTRODUODENOSCOPY    . ESOPHAGOGASTRODUODENOSCOPY N/A 10/19/2015   Procedure: ESOPHAGOGASTRODUODENOSCOPY (EGD);  Surgeon: Gatha Mayer, MD;  Location: [place] [facility];  Service: Endoscopy;  Laterality: N/A;  . GIVENS CAPSULE STUDY  10/21/2015  . GIVENS CAPSULE STUDY N/A 10/21/2015   Procedure: GIVENS CAPSULE STUDY;  Surgeon: Milus Banister, MD;  Location: [place];  Service: Endoscopy;  Laterality: N/A;  . KNEE SURGERY Right 03-01-2018-03-04-2018   PLATEAU FRACTURE HOSPITLIZATION   . REPAIR RIGHT FEMORAL PSEUDOANEUYSM/  RIGHT FEM-POP BYPASS GRAFT/  DEBRIDEMENT RIGHT LOWER EXTREMITIY VENOUS STATUS ULCERS X2  01-12-2005  . TOTAL ABDOMINAL HYSTERECTOMY W/ BILATERAL SALPINGOOPHORECTOMY  1986  . TRANSTHORACIC ECHOCARDIOGRAM  03/25/2010   EF 65-68%, LV systolic function low normal with mild inferoseptal hypocontractility; LA mildly dilated; mod MR; mild TR, RV systolic pressure elevated, mild pulm HTN; AV mildly sclerotic; mild pulm valve regurg; aortic root sclerosis/calcif     Family History  Problem Relation Age of Onset  . Congestive Heart Failure Mother   . Diabetes Mother   . Stroke Father   . Cancer  Maternal Aunt        Breast cancer    Social History:  reports that she quit smoking about 9 years  ago. She has a 60.00 pack-year smoking history. She has never used smokeless tobacco. She reports current alcohol use of about 3.0 standard drinks of alcohol per week. She reports that she does not use drugs.  Review of Systems -    OSTEOPOROSIS: She had Reclast in 2009 No further injections since she has had renal insufficiency  Last bone density done elsewhere showed she had osteoporosis with T score -3.3 at the left femur on 05/16/2018  She has been recommended Prolia by her treating physician in [facility] but she thinks it was not approved also waiting for approval for Tymlos She does not know if she has been approved for patient assistance program She has an appointment in late September  Hyperlipidemia: Has history of high LDL and triglycerides/low HDL  LDL was below 70 with Crestor 20 mg She wanted to switch to generic Lipitor because of cost and with this high triglycerides and LDL are not controlled She thinks she is generally doing well with her diet except for sometimes eating cookies Also on fenofibrate 54 mg  Lab Results  Component Value Date   CHOL 154 11/09/2018   CHOL 140 09/04/2018   CHOL 121 10/04/2017   Lab Results  Component Value Date   HDL 33.50 (L) 11/09/2018   HDL 32.70 (L) 09/04/2018   HDL 29.70 (L) 10/04/2017   Lab Results  Component Value Date   LDLCALC 60 05/21/2016   LDLCALC 49 09/13/2013   [place] 69 08/20/2013   Lab Results  Component Value Date   TRIG 269.0 (H) 11/09/2018   TRIG 309.0 (H) 09/04/2018   TRIG 221.0 (H) 10/04/2017   Lab Results  Component Value Date   CHOLHDL 5 11/09/2018   CHOLHDL 4 09/04/2018   CHOLHDL 4 10/04/2017   Lab Results  Component Value Date   LDLDIRECT 85.0 11/09/2018   LDLDIRECT 65.0 09/04/2018   LDLDIRECT 59.0 10/04/2017      Vitamin D deficiency: She has been on supplements and taking her 50,000 units every other week    History of CHF: No hospitalizations since 2017  She has a low ejection  fraction of 20-25 %  Also she had atrial flutter in 2018 This has been followed by cardiologist  History of TIA: Previous episode where her right hand felt numb and her arm was somewhat heavy and difficult to coordinate This lasted overnight She has been on Plavix in addition to her 81 mg aspirin  However has not had any more episodes  Carotid Dopplers: 60-79% stenosis of the left carotid artery compared to up to 59% stenosis previously, right internal carotid artery is occluded   ANEMIA:  Has had iron deficiency and anemia of chronic disease, taking iron supplements as before Has not followed up with PCP regarding anemia  CBC Latest Ref Rng & Units 05/19/2018 10/03/2017 08/10/2017  WBC 4.0 - 10.5 K/uL 8.6 9.8 7.6  Hemoglobin 12.0 - 15.0 g/dL 11.6(L) 12.1 11.9(L)  Hematocrit 36.0 - 46.0 % 38.1 37.9 35.4(L)  Platelets 150 - 400 K/uL 217 246 183.0      Examination:   There were no vitals taken for this visit.  There is no height or weight on file to calculate BMI.       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 along with history of hydronephrosis:   Creatinine is significantly higher and as before may be related to obstructive uropathy, I do to get stent this month  OSTEOPOROSIS: She will contact her treating physician at [facility] to see if she is approved for the patient assistance for Tymlos  ANEMIA: Likely from chronic disease and hemoglobin will need to be rechecked by her PCP  DIABETES type II on low-dose basal bolus insulin program:  Her blood sugars are occasionally higher after meals depending on her portions and carbohydrates She will increase  her dose to 5 units if eating larger meals in the evening A1c is last 6.5  She will continue Levemir once daily at 12 units  HYPOTHYROIDISM:   TSH is mildly increased with reducing her dose on the last visit She will continue 100 mcg daily  An extra half a tablet once a week  Lipids: Triglycerides are still high and LDL is over 70 now with using Lipitor and start Crestor For now she will increase her Lipitor to 80 mg Discussed low saturated fat diet   Total visit time with telephone encounter = 12 minutes     Patient Instructions  INSULIN doses: May increase NovoLog to 5 units when eating a larger meal in the evening  Diet: Avoid high fat dairy products such as butter, cream and baked goods made from butter Avoid fatty meats Start using protein supplements such as diabetic boost or any protein shake with low sugar content  Levothyroxine: Take extra half a pill once a week  Stop fenofibrate Start taking 2 tablets of 40 mg atorvastatin, new prescription for 80 mg has been sent to [place]  Please have your urologist send [place] lab reports for kidney function after your stent procedure has been done  Please let us know if you want to transfer treatment for your osteoporosis once the medication is approved     [name]  11/15/18    Note: This office note was prepared with Dragon voice recognition system technology. Any transcriptional errors that result from this process are unintentional.

## 2018-11-21 ENCOUNTER — Ambulatory Visit: Payer: Medicare Other | Admitting: Podiatry

## 2018-11-21 NOTE — Patient Instructions (Signed)
DUE TO COVID-19 ONLY ONE VISITOR IS ALLOWED TO COME WITH YOU AND STAY IN THE WAITING ROOM ONLY DURING PRE OP AND PROCEDURE DAY OF SURGERY.  THE 1 VISITOR MAY VISIT WITH YOU AFTER SURGERY IN YOUR PRIVATE ROOM DURING VISITING HOURS ONLY!    YOU NEED TO HAVE A COVID 19 TEST ON_Tues. 9/22______ @_______ , THIS TEST MUST BE DONE BEFORE SURGERY, COME  [place], [address] , [address].  ([place])  ONCE YOUR COVID TEST IS COMPLETED, PLEASE BEGIN THE QUARANTINE INSTRUCTIONS AS OUTLINED IN YOUR HANDOUT.                [name] [name]    Your procedure is scheduled on: Friday 12/01/18   Report to [facility] Main  Entrance   Report to admitting at  7:30 AM     Call this number if you have problems the morning of surgery [phone]    Remember: Do not eat food or drink liquids :After Midnight.   BRUSH YOUR TEETH MORNING OF SURGERY AND RINSE YOUR MOUTH OUT, NO CHEWING GUM CANDY OR MINTS.     Take these medicines the morning of surgery with A SIP OF WATER: Levothyroxine, Prilosec  DO NOT TAKE ANY DIABETIC MEDICATIONS DAY OF YOUR SURGERY       How to Manage Your Diabetes Before and After Surgery  Why is it important to control my blood sugar before and after surgery? . Improving blood sugar levels before and after surgery helps healing and can limit problems. . A way of improving blood sugar control is eating a healthy diet by: o  Eating less sugar and carbohydrates o  Increasing activity/exercise o  Talking with your doctor about reaching your blood sugar goals . High blood sugars (greater than 180 mg/dL) can raise your risk of infections and slow your recovery, so you will need to focus on controlling your diabetes during the weeks before surgery. . Make sure that the doctor who takes care of your diabetes knows about your planned surgery including the date and location.  How do I manage my blood sugar before surgery? . Check your blood sugar at  least 4 times a day, starting 2 days before surgery, to make sure that the level is not too high or low. o Check your blood sugar the morning of your surgery when you wake up and every 2 hours until you get to the Short Stay unit. . If your blood sugar is less than 70 mg/dL, you will need to treat for low blood sugar: o Do not take insulin. o Treat a low blood sugar (less than 70 mg/dL) with  cup of clear juice (cranberry or apple), 4 glucose tablets, OR glucose gel. o Recheck blood sugar in 15 minutes after treatment (to make sure it is greater than 70 mg/dL). If your blood sugar is not greater than 70 mg/dL on recheck, call [phone] for further instructions. . Report your blood sugar to the short stay nurse when you get to Short Stay.  . If you are admitted to the hospital after surgery: o Your blood sugar will be checked by the staff and you will probably be given insulin after surgery (instead of oral diabetes medicines) to make sure you have good blood sugar levels. o The goal for blood sugar control after surgery is 80-180 mg/dL.   WHAT DO I DO ABOUT MY DIABETES MEDICATION?  Marland Kitchen Do not take oral diabetes medicines (pills) the morning of  surgery.  . THE NIGHT BEFORE SURGERY, take  6    units of Levemir      insulin.       . THE MORNING OF SURGERY, take  0  units of   insulin.  . The day of surgery, do not take other diabetes injectables, including Byetta (exenatide), Bydureon (exenatide ER), Victoza (liraglutide), or Trulicity (dulaglutide).  . If your CBG is greater than 220 mg/dL, you may take  of your sliding scale  . (correction) dose of insulin.      You may not have any metal on your body including hair pins and              piercings   Do not wear jewelry, make-up, lotions, powders or perfumes, deodorant             Do not wear nail polish.  Do not shave  48 hours prior to surgery.            Do not bring valuables to the hospital. [place].  Contacts, dentures or bridgework may not be worn into surgery.      Patients discharged the day of surgery will not be allowed to drive home.  IF YOU ARE HAVING SURGERY AND GOING HOME THE SAME DAY, YOU MUST HAVE AN ADULT TO DRIVE YOU HOME AND BE WITH YOU FOR 24 HOURS.  YOU MAY GO HOME BY TAXI OR UBER OR ORTHERWISE, BUT AN ADULT MUST ACCOMPANY YOU HOME AND STAY WITH YOU FOR 24 HOURS.  Name and phone number of your driver:  Special Instructions: N/A              Please read over the following fact sheets you were given: _____________________________________________________________________             [facility] - Preparing for Surgery Before surgery, you can play an important role.   Because skin is not sterile, your skin needs to be as free of germs as possible.   You can reduce the number of germs on your skin by washing with CHG (chlorahexidine gluconate) soap before surgery .  CHG is an antiseptic cleaner which kills germs and bonds with the skin to continue killing germs even after washing. Please DO NOT use if you have an allergy to CHG or antibacterial soaps.   If your skin becomes reddened/irritated stop using the CHG and inform your nurse when you arrive at Short Stay. Do not shave (including legs and underarms) for at least 48 hours prior to the first CHG shower.   Please follow these instructions carefully:  1.  Shower with CHG Soap the night before surgery and the  morning of Surgery.  2.  If you choose to wash your hair, wash your hair first as usual with your  normal  shampoo.  3.  After you shampoo, rinse your hair and body thoroughly to remove the  shampoo.                                        4.  Use CHG as you would any other liquid soap.  You can apply chg directly  to the skin and wash  Gently with a scrungie or clean washcloth.  5.  Apply the CHG Soap to your body ONLY FROM THE NECK DOWN.   Do not use on face/  open                           Wound or open sores. Avoid contact with eyes, ears mouth and genitals (private parts).                       Wash face,  Genitals (private parts) with your normal soap.             6.  Wash thoroughly, paying special attention to the area where your surgery  will be performed.  7.  Thoroughly rinse your body with warm water from the neck down.  8.  DO NOT shower/wash with your normal soap after using and rinsing off  the CHG Soap.             9.  Pat yourself dry with a clean towel.            10.  Wear clean pajamas.            11.  Place clean sheets on your bed the night of your first shower and do not  sleep with pets.  Day of Surgery : Do not apply any lotions/deodorants the morning of surgery.  Please wear clean clothes to the hospital/surgery center.   FAILURE TO FOLLOW THESE INSTRUCTIONS MAY RESULT IN THE CANCELLATION OF YOUR SURGERY PATIENT SIGNATURE_________________________________  NURSE SIGNATURE__________________________________  ________________________________________________________________________

## 2018-11-22 ENCOUNTER — Encounter (HOSPITAL_COMMUNITY): Payer: Self-pay

## 2018-11-22 ENCOUNTER — Other Ambulatory Visit: Payer: Self-pay

## 2018-11-22 ENCOUNTER — Encounter (HOSPITAL_COMMUNITY)
Admission: RE | Admit: 2018-11-22 | Discharge: 2018-11-22 | Disposition: A | Discharge status: Home / Self Care | Payer: Medicare Other | Source: Ambulatory Visit | Attending: Urology | Admitting: Urology

## 2018-11-22 DIAGNOSIS — Z79899 Other long term (current) drug therapy: Secondary | ICD-10-CM | POA: Insufficient documentation

## 2018-11-22 DIAGNOSIS — I4891 Unspecified atrial fibrillation: Secondary | ICD-10-CM | POA: Insufficient documentation

## 2018-11-22 DIAGNOSIS — I2510 Atherosclerotic heart disease of native coronary artery without angina pectoris: Secondary | ICD-10-CM | POA: Insufficient documentation

## 2018-11-22 DIAGNOSIS — Z7902 Long term (current) use of antithrombotics/antiplatelets: Secondary | ICD-10-CM | POA: Insufficient documentation

## 2018-11-22 DIAGNOSIS — K219 Gastro-esophageal reflux disease without esophagitis: Secondary | ICD-10-CM | POA: Insufficient documentation

## 2018-11-22 DIAGNOSIS — E1151 Type 2 diabetes mellitus with diabetic peripheral angiopathy without gangrene: Secondary | ICD-10-CM | POA: Insufficient documentation

## 2018-11-22 DIAGNOSIS — I13 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 1 through stage 4 chronic kidney disease, or unspecified chronic kidney disease: Secondary | ICD-10-CM | POA: Diagnosis not present

## 2018-11-22 DIAGNOSIS — Z79890 Hormone replacement therapy (postmenopausal): Secondary | ICD-10-CM | POA: Insufficient documentation

## 2018-11-22 DIAGNOSIS — N135 Crossing vessel and stricture of ureter without hydronephrosis: Secondary | ICD-10-CM | POA: Diagnosis not present

## 2018-11-22 DIAGNOSIS — E785 Hyperlipidemia, unspecified: Secondary | ICD-10-CM | POA: Diagnosis not present

## 2018-11-22 DIAGNOSIS — E1122 Type 2 diabetes mellitus with diabetic chronic kidney disease: Secondary | ICD-10-CM | POA: Insufficient documentation

## 2018-11-22 DIAGNOSIS — N183 Chronic kidney disease, stage 3 (moderate): Secondary | ICD-10-CM | POA: Diagnosis not present

## 2018-11-22 DIAGNOSIS — I509 Heart failure, unspecified: Secondary | ICD-10-CM | POA: Diagnosis not present

## 2018-11-22 DIAGNOSIS — Z951 Presence of aortocoronary bypass graft: Secondary | ICD-10-CM | POA: Insufficient documentation

## 2018-11-22 DIAGNOSIS — Z794 Long term (current) use of insulin: Secondary | ICD-10-CM | POA: Diagnosis not present

## 2018-11-22 DIAGNOSIS — Z87891 Personal history of nicotine dependence: Secondary | ICD-10-CM | POA: Diagnosis not present

## 2018-11-22 DIAGNOSIS — Z7982 Long term (current) use of aspirin: Secondary | ICD-10-CM | POA: Insufficient documentation

## 2018-11-22 DIAGNOSIS — Z01818 Encounter for other preprocedural examination: Secondary | ICD-10-CM | POA: Insufficient documentation

## 2018-11-22 DIAGNOSIS — D509 Iron deficiency anemia, unspecified: Secondary | ICD-10-CM | POA: Insufficient documentation

## 2018-11-22 LAB — BASIC METABOLIC PANEL
Anion gap: 7 (ref 5–15)
BUN: 44 mg/dL — ABNORMAL HIGH (ref 8–23)
CO2: 24 mmol/L (ref 22–32)
Calcium: 9.6 mg/dL (ref 8.9–10.3)
Chloride: 108 mmol/L (ref 98–111)
Creatinine, Ser: 1.68 mg/dL — ABNORMAL HIGH (ref 0.44–1.00)
GFR calc Af Amer: 34 mL/min — ABNORMAL LOW (ref >60)
GFR calc non Af Amer: 29 mL/min — ABNORMAL LOW (ref >60)
Glucose, Bld: 95 mg/dL (ref 70–99)
Potassium: 5.6 mmol/L — ABNORMAL HIGH (ref 3.5–5.1)
Sodium: 139 mmol/L (ref 135–145)

## 2018-11-22 LAB — CBC
HCT: 36.7 % (ref 36.0–46.0)
Hemoglobin: 11 g/dL — ABNORMAL LOW (ref 12.0–15.0)
MCH: 30.5 pg (ref 26.0–34.0)
MCHC: 30 g/dL (ref 30.0–36.0)
MCV: 101.7 fL — ABNORMAL HIGH (ref 80.0–100.0)
Platelets: 317 10*3/uL (ref 150–400)
RBC: 3.61 MIL/uL — ABNORMAL LOW (ref 3.87–5.11)
RDW: 14 % (ref 11.5–15.5)
WBC: 11.2 10*3/uL — ABNORMAL HIGH (ref 4.0–10.5)
nRBC: 0 % (ref 0.0–0.2)

## 2018-11-22 LAB — GLUCOSE, CAPILLARY: Glucose-Capillary: 94 mg/dL (ref 70–99)

## 2018-11-22 NOTE — Progress Notes (Addendum)
PCP - Dr. Claudean Severance Cardiologist - Dr. Adora Fridge  Chest x-ray - no EKG - 02/22/18 Stress Test - no ECHO - 04/15/16 Cardiac Cath - 2011  Sleep Study - no CPAP -   Fasting Blood Sugar - 95-120 Checks Blood Sugar __1___ times a day  Blood Thinner Instructions:She was told to stop her plavix on 11/25/18 by Dr. Gwenlyn Found.  She had no instructions about her ASA. I told her to call Dr. Shaune Leeks office and ask if she should hold it prior to surgery. Aspirin Instructions: Last Dose:  Anesthesia review:   Patient denies shortness of breath, fever, cough and chest pain at PAT appointment yes  Patient verbalized understanding of instructions that were given to them at the PAT appointment. Patient was also instructed that they will need to review over the PAT instructions again at home before surgery. yes

## 2018-11-23 LAB — HEMOGLOBIN A1C
Hgb A1c MFr Bld: 5.6 % (ref 4.8–5.6)
Mean Plasma Glucose: 114 mg/dL

## 2018-11-24 ENCOUNTER — Telehealth: Payer: Self-pay | Admitting: Family Medicine

## 2018-11-24 ENCOUNTER — Other Ambulatory Visit: Payer: Self-pay | Admitting: Endocrinology

## 2018-11-24 NOTE — Telephone Encounter (Signed)
Message sent to Dr. Jordan for review and approval. 

## 2018-11-24 NOTE — Anesthesia Preprocedure Evaluation (Deleted)
Anesthesia Evaluation    Airway        Dental   Pulmonary former smoker,           Cardiovascular hypertension,      Neuro/Psych    GI/Hepatic   Endo/Other  diabetes  Renal/GU      Musculoskeletal   Abdominal   Peds  Hematology   Anesthesia Other Findings   Reproductive/Obstetrics                             Anesthesia Physical Anesthesia Plan  ASA:   Anesthesia Plan:    Post-op Pain Management:    Induction:   PONV Risk Score and Plan:   Airway Management Planned:   Additional Equipment:   Intra-op Plan:   Post-operative Plan:   Informed Consent:   Plan Discussed with:   Anesthesia Plan Comments: (See PAT note 11/22/2018, Konrad Felix, PA-C)        Anesthesia Quick Evaluation

## 2018-11-24 NOTE — Progress Notes (Signed)
Anesthesia Chart Review   Case: [id number] Date/Time: 12/01/18 0915   Procedure: CYSTOSCOPY WITH STENT PLACEMENT (Right )   Anesthesia type: General   Pre-op diagnosis: RIGHT URETERAL STRICTURE   Location: [facility]   Surgeon: Ardis Hughs, MD      DISCUSSION:78 y.o. former smoker (60 pack years, quit 06/07/09) with h/o PONV, CHF, HTN, HLD, CAD (CABG x 6 1997), GERD, CKD Stage III, PVD, carotid artery stenosis s/p right endarterectomy, A-fib, AAA, DM II, right ureteral stricture scheduled for above procedure 12/01/2018 with Dr. Louis Meckel.   Pt advised to hold Plavix 11/25/2018.    Pt cleared by cardiology 11/08/2018.  Per [name], NP, "Chart reviewed as part of pre-operative protocol coverage. Patient was contacted 11/07/2018 by Sande Rives, PA in reference to pre-operative risk assessment for pending surgery as outlined below.  [name] [name] was last seen on 02/22/18 by Dr. Gwenlyn Found.  Since that day, [name] [name] has done well. She has some activity intolerance such as climbing stairs or walking uphill, mostly due to back pain and not shortness of breath or chest pain.  She does have carotid artery disease but no recent stroke symptoms. Therefore, based on ACC/AHA guidelines, the patient would be at acceptable risk for the planned procedure without further cardiovascular testing. Per Dr. Gwenlyn Found, okay to hold Plavix as needed for procedure.  Resume after procedure once okay with surgeon."  Anticipate pt can proceed with planned procedure barring acute status change.   VS: BP 134/64   Pulse 79   Temp 37 C (Oral)   Resp 18   Ht 5\' 6"  (1.676 m)   Wt 56.9 kg   SpO2 100%   BMI 20.26 kg/m   PROVIDERS: [place], Betty G, MD is PCP   Quay Burow, MD is Cardiologist   [name], MD is Endocrinologist last seen 11/15/2018 LABS: Labs reviewed: Acceptable for surgery. (all labs ordered are listed, but only abnormal results are displayed)  Labs Reviewed   BASIC METABOLIC PANEL - Abnormal; Notable for the following components:      Result Value   Potassium 5.6 (*)    BUN 44 (*)    Creatinine, Ser 1.68 (*)    GFR calc non Af Amer 29 (*)    GFR calc Af Amer 34 (*)    All other components within normal limits  CBC - Abnormal; Notable for the following components:   WBC 11.2 (*)    RBC 3.61 (*)    Hemoglobin 11.0 (*)    MCV 101.7 (*)    All other components within normal limits  GLUCOSE, CAPILLARY  HEMOGLOBIN A1C     IMAGES: VAS US Carotid 02/06/2018 Summary: Right Carotid: Velocities in the right ICA are consistent with a total                occlusion. The CCA appears occluded.  Left Carotid: Velocities in the left ICA are consistent with a 60-79% stenosis.               Non-hemodynamically significant plaque noted in the CCA.  Vertebrals:  Right vertebral artery demonstrates antegrade flow. Left vertebral              artery appears occluded with possible collateral flow visualized. Subclavians: Normal flow hemodynamics were seen in bilateral subclavian              arteries.  *See table(s) above for measurements and observations. Suggest follow up study  in 12 months.  EKG: 02/22/18 Rate 64 bpm Normal sinus rhythm  ST & T wave abnormality, consider lateral ischemia  CV: Echo 04/15/2016 Study Conclusions  - Left ventricle: The cavity size was mildly dilated. Wall   thickness was normal. Systolic function was mildly to moderately   reduced. The estimated ejection fraction was in the range of 40%   to 45%. Diffuse hypokinesis. Doppler parameters are consistent   with abnormal left ventricular relaxation (grade 1 diastolic   dysfunction). Doppler parameters are consistent with high   ventricular filling pressure. - Mitral valve: Calcified annulus. There was mild regurgitation. - Left atrium: The atrium was mildly dilated.  Impressions:  - Mild to moderate global reduction in LV systolic function; grade   1  diastolic dysfunction with elevated LV filling pressure; mild   LVE; mild MR; mild LAE. Past Medical History:  Diagnosis Date  . A-fib ([place])   . AAA (abdominal aortic aneurysm) ([place])   . Bilateral hydronephrosis   . Carotid artery stenosis    carotid doppler 06/2012 - Right CCA/Bulb/ICA with chronic occlusion; L vertebral artery with abnormal blood flow; L Bulb/Prox ICA  s/p endarterectomy with mild fibrous plaque, 50% diameter reduction  . CHF (congestive heart failure) ([place])   . Choledocholithiasis 2017  . CKD (chronic kidney disease), stage III ([place])   . Coronary artery disease due to lipid rich plaque cardiologist-  dr berry   s/p CABG x6 1997-- cath 12-09-2009 occluded vein to obtuse marginal branch and ramus branch with patent vien to PDA and patent LIMA to LAD, ef 40%-- Myoview 11-24-2011, nonischemic  . Diverticulosis   . Dyspnea on exertion   . GERD (gastroesophageal reflux disease)   . GI bleed   . History of sepsis    10-18-2014 w/ acute pyelonephritis  . Hyperlipidemia   . Hypertension   . Hypothyroidism   . Iron deficiency anemia   . Ischemic cardiomyopathy    03-25-2010-- per lasts echo EF  50-55%  . Ischemic cardiomyopathy   . PAD (peripheral artery disease) ([place])    09/2010 LEAs - R ABI of 0.45, occluded fem-pop bypass graft, L ABI of 0.59 with occluded SFA; severe arterial insuff  . PONV (postoperative nausea and vomiting)   . PVD (peripheral vascular disease) with claudication ([place])    last duplex 07-04-2015 -- Right CCA and ICA chronic occlusion, 81-19% LICA, Patent vertebral arteries w/ antegrade flow, bilateral normal subclavian arteries   . Retroperitoneal fibrosis   . Tubular adenoma of colon   . Type 2 diabetes mellitus ([place])    monitored by dr Dwyane Dee    Past Surgical History:  Procedure Laterality Date  . AORTA - BILATERAL FEMORAL ARTERY BYPASS GRAFT  1997   and RIGHT FEM-POP   . CARDIAC CATHETERIZATION  12-09-2009  dr al little   EF >40%-- occluded  vein to OM & ramus branches, patent vein to PDA, patent LIMA to LAD (Dr. [name], [facility]) - later had thrombectomy of R fem-pop bypass ad R common femoral & profunda femoris artery (Dr. Oneida Alar)  . CARDIOVASCULAR STRESS TEST  11-24-2011   dr berry   Low Risk study: fixed basal to mid inferior attenuation artifact, no reversible ischemia,  normal LV function and wall motion , ef 67%  . CAROTID ENDARTERECTOMY Bilateral right 1994//  left ?  Marland Kitchen CATARACT EXTRACTION W/ INTRAOCULAR LENS  IMPLANT, BILATERAL  2006  . CHOLECYSTECTOMY N/A 09/05/2015   Procedure: ATTEMPTED LAPAROSCOPIC CHOLECYSTECTOMY, EXPLORATORY LAPAROTOMY WITH CHOLECYSTECTOMY;  Surgeon: Eddie Dibbles  Daiva Nakayama, MD;  Location: [facility];  Service: General;  Laterality: N/A;  . COLONOSCOPY    . COLONOSCOPY  10/21/2015  . COLONOSCOPY WITH PROPOFOL N/A 10/21/2015   Procedure: COLONOSCOPY WITH PROPOFOL;  Surgeon: Milus Banister, MD;  Location: [facility];  Service: Endoscopy;  Laterality: N/A;  . CORONARY ARTERY BYPASS GRAFT  1997   x6; internal mammary to LAD, SVG to ramus #1 & #2, SVG to OM, SVG to PDA,   . CYSTOSCOPY W/ RETROGRADES Right 08/01/2015   Procedure: CYSTOSCOPY WITH RETROGRADE PYELOGRAM;  Surgeon: Ardis Hughs, MD;  Location: [facility];  Service: Urology;  Laterality: Right;  . CYSTOSCOPY W/ URETERAL STENT PLACEMENT  03/10/2012   Procedure: CYSTOSCOPY WITH RETROGRADE PYELOGRAM/URETERAL STENT PLACEMENT;  Surgeon: Hanley Ben, MD;  [facility];  Service: Urology;  Laterality: Left;  . CYSTOSCOPY W/ URETERAL STENT PLACEMENT Bilateral 03/07/2015   Procedure: BILATERAL RETROGRADE PYELOGRAM AND RIGHT URETERAL STENT PLACEMENT;  Surgeon: Ardis Hughs, MD;  [facility];  Service: Urology;  Laterality: Bilateral;  . CYSTOSCOPY W/ URETERAL STENT PLACEMENT Right 08/01/2015   Procedure: CYSTOSCOPY WITH STENT REPLACEMENT;  Surgeon: Ardis Hughs, MD;  Location: [facility];   Service: Urology;  Laterality: Right;  . CYSTOSCOPY W/ URETERAL STENT PLACEMENT Right 02/13/2016   Procedure: RIGHT URETERAL STENT EXCHANGE;  Surgeon: Ardis Hughs, MD;  [facility];  Service: Urology;  Laterality: Right;  . CYSTOSCOPY W/ URETERAL STENT PLACEMENT Right 09/17/2016   Procedure: CYSTOSCOPY WITH RIGHT  RETROGRADE PYELOGRAM RIGHT URETERAL STENT EXCHANGE;  Surgeon: Ardis Hughs, MD;  [facility];  Service: Urology;  Laterality: Right;  . CYSTOSCOPY W/ URETERAL STENT PLACEMENT Bilateral 03/18/2017   Procedure: CYSTOSCOPY WITH BILATERAL  RETROGRADE PYELOGRAM RIGHT URETERAL STENT [place];  Surgeon: Ardis Hughs, MD;  [facility];  Service: Urology;  Laterality: Bilateral;  . CYSTOSCOPY W/ URETERAL STENT PLACEMENT Right 05/26/2018   Procedure: CYSTOSCOPY WITH RIGHT RETROGRADE PYELOGRAM RIGHT URETERAL STENT EXCHANGE;  Surgeon: Ardis Hughs, MD;  [facility];  Service: Urology;  Laterality: Right;  . CYSTOSCOPY WITH STENT PLACEMENT Right 10/06/2017   Procedure: CYSTOSCOPY, RETROGRADE  WITH RIGHT STENT EXCHANGE;  Surgeon: Ardis Hughs, MD;  [facility];  Service: Urology;  Laterality: Right;  . ERCP N/A 09/03/2015   Procedure: ENDOSCOPIC RETROGRADE CHOLANGIOPANCREATOGRAPHY (ERCP);  Surgeon: Irene Shipper, MD;  Location: [facility] END[facility]COPY;  Service: Endoscopy;  Laterality: N/A;  . ESOPHAGOGASTRODUODENOSCOPY    . ESOPHAGOGASTRODUODENOSCOPY N/A 10/19/2015   Procedure: ESOPHAGOGASTRODUODENOSCOPY (EGD);  Surgeon: Gatha Mayer, MD;  [facility] END[facility]COPY;  Service: Endoscopy;  Laterality: N/A;  . GIVENS CAPSULE STUDY  10/21/2015  . GIVENS CAPSULE STUDY N/A 10/21/2015   Procedure: GIVENS CAPSULE STUDY;  Surgeon: Milus Banister, MD;  Location: [facility];  Service: Endoscopy;  Laterality: N/A;  . KNEE SURGERY Right 03-01-2018-03-04-2018   PLATEAU FRACTURE HOSPITLIZATION   . REPAIR RIGHT FEMORAL PSEUDOANEUYSM/  RIGHT FEM-POP BYPASS GRAFT/   DEBRIDEMENT RIGHT LOWER EXTREMITIY VENOUS STATUS ULCERS X2  01-12-2005  . TOTAL ABDOMINAL HYSTERECTOMY W/ BILATERAL SALPINGOOPHORECTOMY  1986  . TRANSTHORACIC ECHOCARDIOGRAM  03/25/2010   EF 93-79%, LV systolic function low normal with mild inferoseptal hypocontractility; LA mildly dilated; mod MR; mild TR, RV systolic pressure elevated, mild pulm HTN; AV mildly sclerotic; mild pulm valve regurg; aortic root sclerosis/calcif     MEDICATIONS: . acetaminophen (TYLENOL) 650 MG CR tablet  . albuterol (PROVENTIL HFA;VENTOLIN HFA) 108 (90  Base) MCG/ACT inhaler  . aspirin 81 MG chewable tablet  . atorvastatin (LIPITOR) 80 MG tablet  . calcium-vitamin D (OSCAL WITH D) 500-200 MG-UNIT per tablet  . clopidogrel (PLAVIX) 75 MG tablet  . docusate sodium (COLACE) 100 MG capsule  . fenofibrate 54 MG tablet  . ferrous sulfate 325 (65 FE) MG tablet  . furosemide (LASIX) 20 MG tablet  . glucose blood (ONE TOUCH ULTRA TEST) test strip  . HYDROcodone-acetaminophen (NORCO/VICODIN) 5-325 MG tablet  . insulin detemir (LEVEMIR) 100 UNIT/ML injection  . Insulin Pen Needle 32G X 4 MM MISC  . isosorbide mononitrate (IMDUR) 30 MG 24 hr tablet  . latanoprost (XALATAN) 0.005 % ophthalmic solution  . levothyroxine (SYNTHROID) 100 MCG tablet  . lidocaine (LIDODERM) 5 %  . loperamide (IMODIUM A-D) 2 MG tablet  . NOVOLOG FLEXPEN 100 UNIT/ML FlexPen  . omeprazole (PRILOSEC) 20 MG capsule  . trolamine salicylate (ASPERCREME) 10 % cream  . vitamin B-12 (CYANOCOBALAMIN) 1000 MCG tablet  . vitamin C (ASCORBIC ACID) 500 MG tablet  . Vitamin D, Ergocalciferol, (DRISDOL) 1.25 MG (50000 UT) CAPS capsule   No current facility-administered medications for this encounter.      Maia Plan [facility] Pre-Surgical Testing [phone] 11/24/18  11:16 AM

## 2018-11-24 NOTE — Telephone Encounter (Signed)
Pt has called pharm and they will not refill her hydrocodone unless md office call. [facility] [address]

## 2018-11-24 NOTE — Telephone Encounter (Signed)
Can you please verify with pharmacy. She is supposed to have a Rx to be filled 11/20/18,posdated. Thanks, BJ

## 2018-11-27 ENCOUNTER — Encounter (HOSPITAL_COMMUNITY): Payer: Medicare Other

## 2018-11-27 ENCOUNTER — Other Ambulatory Visit: Payer: Self-pay | Admitting: Endocrinology

## 2018-11-27 NOTE — Telephone Encounter (Signed)
Called pharmacy and they stated that Rx is filled and ready for pick up. Lvm informing patient.

## 2018-11-28 ENCOUNTER — Encounter (HOSPITAL_COMMUNITY): Payer: Medicare Other

## 2018-11-28 ENCOUNTER — Other Ambulatory Visit (HOSPITAL_COMMUNITY)
Admission: RE | Admit: 2018-11-28 | Discharge: 2018-11-28 | Disposition: A | Discharge status: Home / Self Care | Payer: Medicare Other | Source: Ambulatory Visit | Attending: Urology | Admitting: Urology

## 2018-11-28 DIAGNOSIS — Z20828 Contact with and (suspected) exposure to other viral communicable diseases: Secondary | ICD-10-CM | POA: Diagnosis not present

## 2018-11-28 DIAGNOSIS — Z01812 Encounter for preprocedural laboratory examination: Secondary | ICD-10-CM | POA: Insufficient documentation

## 2018-11-29 LAB — NOVEL CORONAVIRUS, NAA (HOSP ORDER, SEND-OUT TO REF LAB; TAT 18-24 HRS): SARS-CoV-2, NAA: NOT DETECTED

## 2018-11-30 MED ORDER — GENTAMICIN SULFATE 40 MG/ML IJ SOLN
85.0000 mg | INTRAVENOUS | Status: AC
  Administered 2018-12-01: 85 mg via INTRAVENOUS
  Filled 2018-11-30: qty 2.25

## 2018-12-01 ENCOUNTER — Ambulatory Visit (HOSPITAL_COMMUNITY): Payer: Medicare Other

## 2018-12-01 ENCOUNTER — Ambulatory Visit (HOSPITAL_COMMUNITY): Payer: Medicare Other | Admitting: Physician Assistant

## 2018-12-01 ENCOUNTER — Encounter (HOSPITAL_COMMUNITY): Payer: Self-pay | Admitting: *Deleted

## 2018-12-01 ENCOUNTER — Encounter (HOSPITAL_COMMUNITY)
Admission: RE | Disposition: A | Discharge status: Home / Self Care | Payer: Self-pay | Source: Ambulatory Visit | Attending: Urology

## 2018-12-01 ENCOUNTER — Ambulatory Visit (HOSPITAL_COMMUNITY): Payer: Medicare Other | Admitting: Certified Registered Nurse Anesthetist

## 2018-12-01 ENCOUNTER — Ambulatory Visit (HOSPITAL_COMMUNITY)
Admission: RE | Admit: 2018-12-01 | Discharge: 2018-12-01 | Disposition: A | Discharge status: Home / Self Care | Payer: Medicare Other | Source: Ambulatory Visit | Attending: Urology | Admitting: Urology

## 2018-12-01 DIAGNOSIS — M1990 Unspecified osteoarthritis, unspecified site: Secondary | ICD-10-CM | POA: Insufficient documentation

## 2018-12-01 DIAGNOSIS — N131 Hydronephrosis with ureteral stricture, not elsewhere classified: Secondary | ICD-10-CM

## 2018-12-01 DIAGNOSIS — I13 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 1 through stage 4 chronic kidney disease, or unspecified chronic kidney disease: Secondary | ICD-10-CM | POA: Insufficient documentation

## 2018-12-01 DIAGNOSIS — N184 Chronic kidney disease, stage 4 (severe): Secondary | ICD-10-CM | POA: Diagnosis not present

## 2018-12-01 DIAGNOSIS — I2510 Atherosclerotic heart disease of native coronary artery without angina pectoris: Secondary | ICD-10-CM | POA: Insufficient documentation

## 2018-12-01 DIAGNOSIS — I4891 Unspecified atrial fibrillation: Secondary | ICD-10-CM | POA: Diagnosis not present

## 2018-12-01 DIAGNOSIS — N183 Chronic kidney disease, stage 3 (moderate): Secondary | ICD-10-CM | POA: Diagnosis not present

## 2018-12-01 DIAGNOSIS — Z79899 Other long term (current) drug therapy: Secondary | ICD-10-CM | POA: Diagnosis not present

## 2018-12-01 DIAGNOSIS — Z7982 Long term (current) use of aspirin: Secondary | ICD-10-CM | POA: Insufficient documentation

## 2018-12-01 DIAGNOSIS — E1151 Type 2 diabetes mellitus with diabetic peripheral angiopathy without gangrene: Secondary | ICD-10-CM | POA: Insufficient documentation

## 2018-12-01 DIAGNOSIS — Z7902 Long term (current) use of antithrombotics/antiplatelets: Secondary | ICD-10-CM | POA: Diagnosis not present

## 2018-12-01 DIAGNOSIS — E039 Hypothyroidism, unspecified: Secondary | ICD-10-CM | POA: Insufficient documentation

## 2018-12-01 DIAGNOSIS — I255 Ischemic cardiomyopathy: Secondary | ICD-10-CM | POA: Insufficient documentation

## 2018-12-01 DIAGNOSIS — Z951 Presence of aortocoronary bypass graft: Secondary | ICD-10-CM | POA: Insufficient documentation

## 2018-12-01 DIAGNOSIS — N135 Crossing vessel and stricture of ureter without hydronephrosis: Secondary | ICD-10-CM | POA: Diagnosis not present

## 2018-12-01 DIAGNOSIS — E1122 Type 2 diabetes mellitus with diabetic chronic kidney disease: Secondary | ICD-10-CM | POA: Insufficient documentation

## 2018-12-01 DIAGNOSIS — Z794 Long term (current) use of insulin: Secondary | ICD-10-CM | POA: Diagnosis not present

## 2018-12-01 DIAGNOSIS — I509 Heart failure, unspecified: Secondary | ICD-10-CM | POA: Insufficient documentation

## 2018-12-01 DIAGNOSIS — Z87891 Personal history of nicotine dependence: Secondary | ICD-10-CM | POA: Diagnosis not present

## 2018-12-01 HISTORY — PX: CYSTOSCOPY WITH STENT PLACEMENT: SHX5790

## 2018-12-01 LAB — COMPREHENSIVE METABOLIC PANEL
ALT: 11 U/L (ref 0–44)
AST: 24 U/L (ref 15–41)
Albumin: 2.9 g/dL — ABNORMAL LOW (ref 3.5–5.0)
Alkaline Phosphatase: 55 U/L (ref 38–126)
Anion gap: 12 (ref 5–15)
BUN: 27 mg/dL — ABNORMAL HIGH (ref 8–23)
CO2: 19 mmol/L — ABNORMAL LOW (ref 22–32)
Calcium: 8 mg/dL — ABNORMAL LOW (ref 8.9–10.3)
Chloride: 107 mmol/L (ref 98–111)
Creatinine, Ser: 1.5 mg/dL — ABNORMAL HIGH (ref 0.44–1.00)
GFR calc Af Amer: 39 mL/min — ABNORMAL LOW (ref >60)
GFR calc non Af Amer: 33 mL/min — ABNORMAL LOW (ref >60)
Glucose, Bld: 100 mg/dL — ABNORMAL HIGH (ref 70–99)
Potassium: 4.7 mmol/L (ref 3.5–5.1)
Sodium: 138 mmol/L (ref 135–145)
Total Bilirubin: 0.7 mg/dL (ref 0.3–1.2)
Total Protein: 6.1 g/dL — ABNORMAL LOW (ref 6.5–8.1)

## 2018-12-01 LAB — GLUCOSE, CAPILLARY
Glucose-Capillary: 99 mg/dL (ref 70–99)
Glucose-Capillary: 119 mg/dL — ABNORMAL HIGH (ref 70–99)

## 2018-12-01 SURGERY — CYSTOSCOPY, WITH STENT INSERTION
Anesthesia: General | Laterality: Right

## 2018-12-01 MED ORDER — CIPROFLOXACIN IN D5W 400 MG/200ML IV SOLN
INTRAVENOUS | Status: AC
  Filled 2018-12-01: qty 200

## 2018-12-01 MED ORDER — LACTATED RINGERS IV SOLN
INTRAVENOUS | Status: DC
  Administered 2018-12-01: 08:00:00 via INTRAVENOUS

## 2018-12-01 MED ORDER — OXYCODONE HCL 5 MG PO TABS: 5.0000 mg | ORAL_TABLET | Freq: Once | ORAL | Status: DC | PRN

## 2018-12-01 MED ORDER — ONDANSETRON HCL 4 MG/2ML IJ SOLN: 4.0000 mg | Freq: Once | INTRAMUSCULAR | Status: DC | PRN

## 2018-12-01 MED ORDER — ONDANSETRON HCL 4 MG/2ML IJ SOLN
INTRAMUSCULAR | Status: AC
  Filled 2018-12-01: qty 2

## 2018-12-01 MED ORDER — FENTANYL CITRATE (PF) 100 MCG/2ML IJ SOLN
25.0000 ug | INTRAMUSCULAR | Status: DC | PRN
  Administered 2018-12-01: 50 ug via INTRAVENOUS

## 2018-12-01 MED ORDER — ONDANSETRON HCL 4 MG/2ML IJ SOLN
INTRAMUSCULAR | Status: DC | PRN
  Administered 2018-12-01: 4 mg via INTRAVENOUS

## 2018-12-01 MED ORDER — PHENAZOPYRIDINE HCL 200 MG PO TABS: 200.0000 mg | ORAL_TABLET | Freq: Three times a day (TID) | ORAL | 0 refills | Status: DC | PRN

## 2018-12-01 MED ORDER — FENTANYL CITRATE (PF) 100 MCG/2ML IJ SOLN
INTRAMUSCULAR | Status: DC | PRN
  Administered 2018-12-01 (×4): 25 ug via INTRAVENOUS

## 2018-12-01 MED ORDER — LIDOCAINE 2% (20 MG/ML) 5 ML SYRINGE
INTRAMUSCULAR | Status: AC
  Filled 2018-12-01: qty 5

## 2018-12-01 MED ORDER — CIPROFLOXACIN IN D5W 400 MG/200ML IV SOLN
INTRAVENOUS | Status: DC | PRN
  Administered 2018-12-01: 400 mg via INTRAVENOUS

## 2018-12-01 MED ORDER — FENTANYL CITRATE (PF) 100 MCG/2ML IJ SOLN
INTRAMUSCULAR | Status: AC
  Filled 2018-12-01: qty 2

## 2018-12-01 MED ORDER — PROPOFOL 10 MG/ML IV BOLUS
INTRAVENOUS | Status: AC
  Filled 2018-12-01: qty 20

## 2018-12-01 MED ORDER — OXYCODONE HCL 5 MG/5ML PO SOLN: 5.0000 mg | Freq: Once | ORAL | Status: DC | PRN

## 2018-12-01 MED ORDER — SODIUM CHLORIDE 0.9 % IV SOLN
INTRAVENOUS | Status: DC | PRN
  Administered 2018-12-01: 50 mL

## 2018-12-01 MED ORDER — LIDOCAINE 2% (20 MG/ML) 5 ML SYRINGE
INTRAMUSCULAR | Status: DC | PRN
  Administered 2018-12-01: 60 mg via INTRAVENOUS

## 2018-12-01 MED ORDER — CLOPIDOGREL BISULFATE 75 MG PO TABS: 75.0000 mg | ORAL_TABLET | Freq: Every day | ORAL | 0 refills | Status: DC

## 2018-12-01 MED ORDER — PROPOFOL 10 MG/ML IV BOLUS
INTRAVENOUS | Status: DC | PRN
  Administered 2018-12-01: 100 mg via INTRAVENOUS

## 2018-12-01 SURGICAL SUPPLY — 16 items
BAG URINE LEG 500ML (DRAIN) ×2 IMPLANT
BAG URO CATCHER STRL LF (MISCELLANEOUS) ×3 IMPLANT
CATH FOLEY 2WAY SLVR  5CC 18FR (CATHETERS) ×2
CATH FOLEY 2WAY SLVR 5CC 18FR (CATHETERS) IMPLANT
CATH URET 5FR 28IN OPEN ENDED (CATHETERS) ×3 IMPLANT
CLOTH BEACON ORANGE TIMEOUT ST (SAFETY) ×3 IMPLANT
COVER WAND RF STERILE (DRAPES) IMPLANT
GLOVE BIOGEL M STRL SZ7.5 (GLOVE) ×3 IMPLANT
GOWN STRL REUS W/TWL LRG LVL3 (GOWN DISPOSABLE) ×6 IMPLANT
GUIDEWIRE STR DUAL SENSOR (WIRE) ×3 IMPLANT
KIT TURNOVER KIT A (KITS) ×2 IMPLANT
MANIFOLD NEPTUNE II (INSTRUMENTS) ×3 IMPLANT
PACK CYSTO (CUSTOM PROCEDURE TRAY) ×3 IMPLANT
TUBING CONNECTING 10 (TUBING) ×1 IMPLANT
TUBING CONNECTING 10' (TUBING) ×1
TUBING UROLOGY SET (TUBING) ×2 IMPLANT

## 2018-12-01 NOTE — Op Note (Signed)
Preoperative diagnosis:  1. Right ureteral stricture  Postoperative diagnosis:  1. Same  Procedure: 1. Cystoscopy, right retrograde pyelogram with interpretation 2. Right ureteral stent exchange  Surgeon: Ardis Hughs, MD  Anesthesia: General  Complications: None  Intraoperative findings: The bladder was erythematous with lots of sediment, there was bleeding that occurred at the bladder neck making the entire process more difficult.  The stent was encrusted but easily removed and easily wired.  The retrograde pyelogram demonstrated a hydronephrotic ureter all the way down to the distal ureter with torturous proximal ureter and diffusely dilated and blunted right renal pelvis there were no filling defects.  EBL: Minimal  Specimens: None  Indication: [name] [name] is a 78 y.o. patient with history of retroperitoneal fibrosis and ureteral strictures from her vascular surgeries, which have been managed with chronic indwelling stents.  The left stent was removed several years ago as that kidney appeared to have no function.  As such we have been maintaining her right kidney with serial stent exchanges.  After reviewing the management options for treatment, he elected to proceed with the above surgical procedure(s). We have discussed the potential benefits and risks of the procedure, side effects of the proposed treatment, the likelihood of the patient achieving the goals of the procedure, and any potential problems that might occur during the procedure or recuperation. Informed consent has been obtained.  Description of procedure:  The patient was taken to the operating room and general anesthesia was induced.  The patient was placed in the dorsal lithotomy position, prepped and draped in the usual sterile fashion, and preoperative antibiotics were administered. A preoperative time-out was performed.   22 French surgery cystoscope was gently passed to the patient's urethra into the  bladder under visual guidance.  The bladder was noted to be friable and indurated with lots of sediment.  I irrigated the bladder several times and perform cystoscopy to the best of my capabilities given the decreased visualization.  The bladder neck was very friable and had been irritated and as such was bleeding.  However, there was no other significant abnormalities including mucosal tumors.  I then grasped the stent emanating from the right ureteral orifice and pulled it to the ureteral orifice.  I then advanced a wire up through the stent and into the right renal pelvis removing stent over the wire.  I then advanced a 5 [place] open-ended catheter over the wire, however the wire was inadvertently removed and was unable to pass open-ended catheter.  I then spent the next 20 minutes trying to find the patient's right ureteral orifice.  Once I was able to ultimately cannulated I advanced the open-ended catheter up to the mid ureter and performed retrograde pyelogram slowly pulling back to the distal ureter with the above findings.  I then advanced a sensor wire through the open-ended catheter removed the catheter over the wire.  I next advanced a 22 cm time 6 French double-J stent over the wire and into the right renal pelvis.  Once it was noted to be well within the upper pole I advanced the stent to the bladder neck before removing the wire.  The stent was noted to be well positioned within the kidney as well as the bladder.  I subsequently emptied the bladder and placed an 18 French Foley catheter and inserted 20 cc of sterile water into the Foley balloon.  She was subsequently extubated return the PACU in stable condition.  Ardis Hughs, M.D.

## 2018-12-01 NOTE — H&P (Signed)
Ureteral Stricture  HPI: [name] [name] is a 78 year-old female established patient who is here for further eval and management of a ureteral stricture.  Her problem has been present for a few years. She has had previous surgery as a possible source of her ureteral stricture. She has had chronic stent for treatment of their ureteral stricture.   She is currently having trouble urinating. She is not having problems getting her urine stream started. The patient complains of urinary leakage. She is not urinating more frequently now than usual. She does dribble at the end of urination.   She is having flank pain. She does have a history of urinary infections. Her renal function is normal. She did not see blood in her urine.   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 The left side was initially obstructed and managed with a stent. Differential function demonstrated little function and stent was removed. Right hydro developed in Fall 2016. Now being managed with ureteral stents, not a good surgical candidate otherwise.  12/18: She states being hospitalized for dehydration and urinary tract infection over the Thanksgiving holiday.   04/13/18: Patient had a recent knee injury and is currently at facility.   Intv: The patient presents today for follow-up. She was recently seen and treated for an E coli urinary tract infection. She has had 2 infections over the last 6 months. She is due to have her stent exchanged. She the patient states that she has had persistent frequency, urgency, bladder pressure. She is getting up more at night. She denies any fevers, but does state that she has had some chills. She denies any altered mental status or dizziness.     ALLERGIES: None    MEDICATIONS: Crestor 20 mg tablet  Levothyroxine Sodium 125 mcg tablet  Plavix  Acetaminophen 325 mg tablet  Albuterol Sulfate 2.5 mg/3 ml (0.083 %) vial, nebulizer Inhalation  Aspirin Ec 81 mg tablet, delayed release   Calcium + D  Clopidogrel 75 mg tablet  Ferrous Sulfate 325 mg (65 mg iron) tablet  Ferrous Sulfate 325 (65 Fe) MG Oral Tablet Delayed Release Oral  Hydrocodone-Acetaminophen 5 mg-325 mg tablet  Imodium A-D 2 mg capsule  Isosorbide Mononitrate Er 30 mg tablet, extended release 24 hr  Lasix 20 mg tablet  Latanoprost 0.005 % drops  Levemir FlexPen 100 UNIT/ML SOLN Subcutaneous  Lidoderm 5 % adhesive patch, medicated  Metoprolol Succinate 25 mg tablet, extended release 24 hr Oral  Miralax  Novolog Flexpen 100 unit/ml (3 ml) insulin pen Subcutaneous  Omeprazole 20 mg capsule,delayed release Oral  Rosuvastatin Calcium 20 mg tablet  Sodium Bicarbonate 650 mg tablet  Tylenol Arthritis 650 mg tablet, extended release  Ventolin Hfa 90 mcg hfa aerosol with adapter  Vitamin B-12 TABS Oral  Vitamin C  Vitamin D2 1,250 mcg (50,000 unit) capsule Oral     GU PSH: Cystoscopy Insert Stent, Right - 05/26/2018, Right - 10/06/2017, Right - 2019, Right - 2018, Right - 2017, 2017, 2017, 2014, 2014 Hysterectomy Unilat SO - 2014       PSH Notes: Cystoscopy With Insertion Of Ureteral Stent Right, Cystoscopy With Insertion Of Ureteral Stent Right, Cesarean Section, Hysterectomy, Coronary Artery Single Venous Bypass Graft, Cystoscopy With Insertion Of Ureteral Stent, Cystoscopy With Insertion Of Ureteral Stent Left   NON-GU PSH: CABG (coronary artery bypass grafting) - 2014 Cesarean Delivery Only - 2014 Cholecystectomy (open), 09/2015     GU PMH: Ureteral stricture - 10/20/2018, - 10/11/2018, - 07/20/2018, - 07/14/2018, - 04/13/2018, -  01/19/2018, - 08/08/2017, Right, - 02/08/2017, Right, 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 This is being managed withchronic indwelling stents. She is due to have her stent changed soon, her last stent was exchanged 6 months prior. 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 She had a stent placed in the spring of 2015 followed by renogram demonstrating 18% function on the left kidney. Her stent was subsequently removed.  Recently she developed bilateral hydronephrosis, renogram demonstrated bilateral obstruction with 85% right function and 15% left sided function a significant change from her previous ultrasounds and renograms. In Jan 2015 she had no hydro or evidence of right sided obstruction.   02/21/15 bilateral RPGs- left side showed a normal distal ureter to the pelvic brim where it stopped abruptly and I was unable to opacify the proximal portion. On the right side showed a normal distal ureter with abrupt narrowing at the pelvic brim and proximal dilation. A right sided stent was placed.   She has a history of recurrent infections. In 2015 she had 4 Escherichia coli infections.   03/2015: treated for UTI - keflex - no growth. recommdended lactobacillus, as well as cranberry tablets twice daily   2/17: BUN/Cr- 30/1.78     NON-GU PMH: Pyuria/other UA findings - 07/14/2018 Encounter for general adult medical examination without abnormal findings, Encounter for preventive health examination - 2016 Personal history of other diseases of the circulatory system, History of cardiac disorder - 2014, History of hypertension, - 2014 Personal history of other endocrine, nutritional and metabolic disease, History of diabetes mellitus - 2014, History of hypercholesterolemia, - 2014, History of hypothyroidism, - 2014 Atherosclerotic heart disease of native coronary artery without angina  pectoris GERD History of falling Hyperlipidemia, unspecified 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 1 through stage 4 chronic kidney disease, or unspecified chronic kidney disease Hypothyroidism Ischemic cardiomyopathy Occlusion and stenosis of unspecified carotid artery 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 Smoked for 30 years.   Tobacco Use Assessment Completed: Used Tobacco in last 30 days? Social Drinker.  Drinks 1 caffeinated drink per day.     Notes: Former smoker, Tobacco use, Marital History - Single, Retired From Work, Alcohol Use, Caffeine Use   REVIEW OF SYSTEMS:    GU Review Female:   Patient reports frequent urination, burning /pain with urination, get up at night to urinate, leakage of urine, stream starts and stops, and trouble starting your stream. Patient denies hard to postpone urination, have to strain  to urinate, and being pregnant.  Gastrointestinal (Upper):   Patient denies nausea, vomiting, and indigestion/ heartburn.  Gastrointestinal (Lower):   Patient reports constipation. Patient denies diarrhea.  Constitutional:   Patient denies fever, night sweats, weight loss, and fatigue.  Skin:   Patient denies skin rash/ lesion and itching.  Eyes:    Patient denies blurred vision and double vision.  Ears/ Nose/ Throat:   Patient denies sore throat and sinus problems.  Hematologic/Lymphatic:   Patient denies swollen glands and easy bruising.  Cardiovascular:   Patient denies leg swelling and chest pains.  Respiratory:   Patient denies cough and shortness of breath.  Endocrine:   Patient denies excessive thirst.  Musculoskeletal:   Patient denies back pain and joint pain.  Neurological:   Patient denies headaches and dizziness.  Psychologic:   Patient denies depression and anxiety.   Notes: Incomplete bladder emptying, nocturia, chills    VITAL SIGNS:      10/30/2018 10:57 AM  Weight 128 lb / 58.06 kg  Height 66 in / 167.64 cm  Temperature 97.9 F / 36.6 C  BMI 20.7 kg/m   PAST DATA REVIEWED:  Source Of History:  Patient  Records Review:   Pathology Reports, Previous Doctor Records, Previous Patient Records, POC Tool  Urine Test Review:   Urinalysis, Urine Culture   PROCEDURES:         PVR Ultrasound - 28366  Scanned Volume: 93 cc         Urinalysis w/Scope Dipstick Dipstick Cont'd Micro  Color: Yellow Bilirubin: Neg mg/dL WBC/hpf: >60/hpf  Appearance: Cloudy Ketones: Neg mg/dL RBC/hpf: 20 - 40/hpf  Specific Gravity: 1.015 Blood: 3+ ery/uL Bacteria: Many (>50/hpf)  pH: 8.0 Protein: 3+ mg/dL Cystals: Amorph Phosphates  Glucose: Neg mg/dL Urobilinogen: 0.2 mg/dL Casts: NS (Not Seen)    Nitrites: Neg Trichomonas: Not Present    Leukocyte Esterase: 3+ leu/uL Mucous: Present      Epithelial Cells: 10 - 20/hpf      Yeast: NS (Not Seen)      Sperm: Not Present    Notes: Renal tubular epithelials noted.    ASSESSMENT:      ICD-10 Details  1 GU:   Ureteral stricture - N13.5      PLAN:            Medications New Meds: Phenazopyridine Hcl 200 mg tablet 1 tablet PO Q 8 H PRN   #10  0 Refill(s)            Orders Labs Urine Culture          Document Letter(s):  Created for Patient: Clinical Summary          Notes:   The patient has persistent lower urinary tract symptoms including frequency and persistent bladder pressure getting up once an hour at night. Her urine today does not appear to be infected. I did send a urine culture. I have given her some Myrbetriq to help relieve some of her symptoms as well as a prescription for peridium. We will try to change her stent soon see if this helps improve some of her overactive bladder symptoms.

## 2018-12-01 NOTE — Progress Notes (Signed)
Spoke with Dr. Louis Meckel regarding need to urinate after foley d/c.  Dr. Louis Meckel stated to keep foley in place for 30 more minutes and then d/c foley.  Pt does NOT need to void prior to discharge.

## 2018-12-01 NOTE — Anesthesia Procedure Notes (Signed)
Procedure Name: LMA Insertion Date/Time: 12/01/2018 9:47 AM Performed by: [place] Ayauna Mcnay C, CRNA Pre-anesthesia Checklist: Patient identified, Emergency Drugs available, Suction available and Patient being monitored Patient Re-evaluated:Patient Re-evaluated prior to induction Oxygen Delivery Method: Circle system utilized Preoxygenation: Pre-oxygenation with 100% oxygen Induction Type: IV induction Ventilation: Mask ventilation without difficulty LMA: LMA inserted LMA Size: 4.0 Number of attempts: 1 Airway Equipment and Method: Bite block Placement Confirmation: positive ETCO2 Tube secured with: Tape Dental Injury: Teeth and Oropharynx as per pre-operative assessment

## 2018-12-01 NOTE — Discharge Instructions (Signed)
DISCHARGE INSTRUCTIONS FOR KIDNEY STONE/URETERAL STENT   MEDICATIONS:  1.  Resume all your other meds from home - except do not take any extra narcotic pain meds that you may have at home.  2. Pyridium is to help with the burning/stinging when you urinate.  ACTIVITY:  1. No strenuous activity x 1week  2. No driving while on narcotic pain medications  3. Drink plenty of water  4. Continue to walk at home - you can still get blood clots when you are at home, so keep active, but don't over do it.  5. May return to work/school tomorrow or when you feel ready   BATHING:  1. You can shower and we recommend daily showers   SIGNS/SYMPTOMS TO CALL:  Please call us if you have a fever greater than 101.5, uncontrolled nausea/vomiting, uncontrolled pain, dizziness, unable to urinate, bloody urine, chest pain, shortness of breath, leg swelling, leg pain, redness around wound, drainage from wound, or any other concerns or questions.   You can reach [place] at [phone].   FOLLOW-UP:  1. You should call to make a follow-up appointment in 5 months.     General Anesthesia, Adult, Care After This sheet gives you information about how to care for yourself after your procedure. Your health care provider may also give you more specific instructions. If you have problems or questions, contact your health care provider. What can I expect after the procedure? After the procedure, the following side effects are common:  Pain or discomfort at the IV site.  Nausea.  Vomiting.  Sore throat.  Trouble concentrating.  Feeling cold or chills.  Weak or tired.  Sleepiness and fatigue.  Soreness and body aches. These side effects can affect parts of the body that were not involved in surgery. Follow these instructions at home:  For at least 24 hours after the procedure:  Have a responsible adult stay with you. It is important to have someone help care for you until you are awake and alert.  Rest as  needed.  Do not: ? Participate in activities in which you could fall or become injured. ? Drive. ? Use heavy machinery. ? Drink alcohol. ? Take sleeping pills or medicines that cause drowsiness. ? Make important decisions or sign legal documents. ? Take care of children on your own. Eating and drinking  Follow any instructions from your health care provider about eating or drinking restrictions.  When you feel hungry, start by eating small amounts of foods that are soft and easy to digest (bland), such as toast. Gradually return to your regular diet.  Drink enough fluid to keep your urine pale yellow.  If you vomit, rehydrate by drinking water, juice, or clear broth. General instructions  If you have sleep apnea, surgery and certain medicines can increase your risk for breathing problems. Follow instructions from your health care provider about wearing your sleep device: ? Anytime you are sleeping, including during daytime naps. ? While taking prescription pain medicines, sleeping medicines, or medicines that make you drowsy.  Return to your normal activities as told by your health care provider. Ask your health care provider what activities are safe for you.  Take over-the-counter and prescription medicines only as told by your health care provider.  If you smoke, do not smoke without supervision.  Keep all follow-up visits as told by your health care provider. This is important. Contact a health care provider if:  You have nausea or vomiting that does not get better  with medicine.  You cannot eat or drink without vomiting.  You have pain that does not get better with medicine.  You are unable to pass urine.  You develop a skin rash.  You have a fever.  You have redness around your IV site that gets worse. Get help right away if:  You have difficulty breathing.  You have chest pain.  You have blood in your urine or stool, or you vomit blood. Summary  After the  procedure, it is common to have a sore throat or nausea. It is also common to feel tired.  Have a responsible adult stay with you for the first 24 hours after general anesthesia. It is important to have someone help care for you until you are awake and alert.  When you feel hungry, start by eating small amounts of foods that are soft and easy to digest (bland), such as toast. Gradually return to your regular diet.  Drink enough fluid to keep your urine pale yellow.  Return to your normal activities as told by your health care provider. Ask your health care provider what activities are safe for you. This information is not intended to replace advice given to you by your health care provider. Make sure you discuss any questions you have with your health care provider. Document Released: 05/31/2000 Document Revised: 02/25/2017 Document Reviewed: 10/08/2016 Elsevier Patient Education  2020 [employer].

## 2018-12-01 NOTE — Anesthesia Postprocedure Evaluation (Signed)
Anesthesia Post Note  Patient: [name] [name]  Procedure(s) Performed: CYSTOSCOPY WITH STENT PLACEMENT (Right )     Patient location during evaluation: PACU Anesthesia Type: General Level of consciousness: awake and alert Pain management: pain level controlled Vital Signs Assessment: post-procedure vital signs reviewed and stable Respiratory status: spontaneous breathing, nonlabored ventilation and respiratory function stable Cardiovascular status: blood pressure returned to baseline and stable Postop Assessment: no apparent nausea or vomiting Anesthetic complications: no    Last Vitals:  Vitals:   12/01/18 1215 12/01/18 1250  BP: (!) 149/80 (!) 152/63  Pulse: 79 75  Resp: 16 17  Temp: 36.7 C 36.5 C  SpO2: 96% 95%    Last Pain:  Vitals:   12/01/18 1250  TempSrc: Oral  PainSc:                  Lidia Collum

## 2018-12-01 NOTE — Anesthesia Preprocedure Evaluation (Addendum)
Anesthesia Evaluation  Patient identified by MRN, date of birth, ID band Patient awake    Reviewed: Allergy & Precautions, NPO status , Patient's Chart, lab work & pertinent test results  History of Anesthesia Complications (+) PONV  Airway Mallampati: II  TM Distance: >3 FB Neck ROM: Full    Dental  (+) Dental Advisory Given, Poor Dentition, Missing,    Pulmonary former smoker,    Pulmonary exam normal breath sounds clear to auscultation       Cardiovascular hypertension, + CAD, + CABG (1997), + Peripheral Vascular Disease and +CHF  Normal cardiovascular exam+ dysrhythmias Atrial Fibrillation  Rhythm:Regular Rate:Normal  TTE 2/18: EF 40-45%, diffuse hypokinesis, grade 1 diastolic dysfunction, mild MR, mild LAE     Neuro/Psych Carotid stenosis s/p CEA    GI/Hepatic Neg liver ROS, GERD  Medicated,  Endo/Other  diabetes, Type 2, Insulin DependentHypothyroidism   Renal/GU Renal InsufficiencyRenal disease     Musculoskeletal  (+) Arthritis ,   Abdominal   Peds  Hematology  (+) anemia ,   Anesthesia Other Findings Echo 04/15/16: Mild to moderate global reduction in LV systolic function (EF 40-10%); grade1 diastolic dysfunction with elevated LV filling pressure; mildLVE; mild MR; mild LAE.  Pt cleared by cardiology 11/08/2018.  Per [name], NP, "Chart reviewed as part of pre-operative protocol coverage. Patient was contacted 11/07/2018 by Sande Rives, PA in reference to pre-operative risk assessment for pending surgery as outlined below.  [name] [name] was last seen on 02/22/18 by Dr. Gwenlyn Found.  Since that day, [name] [name] has done well. She has some activity intolerance such as climbing stairs or walking uphill, mostly due to back pain and not shortness of breath or chest pain.  She does have carotid artery disease but no recent stroke symptoms. Therefore, based on ACC/AHA guidelines, the patient would be at  acceptable risk for the planned procedure without further cardiovascular testing. Per Dr. Gwenlyn Found, okay to hold Plavix as needed for procedure.  Resume after procedure once okay with surgeon."  Reproductive/Obstetrics                          Anesthesia Physical  Anesthesia Plan  ASA: III  Anesthesia Plan: General   Post-op Pain Management:    Induction: Intravenous  PONV Risk Score and Plan: 4 or greater and Treatment may vary due to age or medical condition, Ondansetron and Dexamethasone  Airway Management Planned: LMA  Additional Equipment: None  Intra-op Plan:   Post-operative Plan: Extubation in OR  Informed Consent: I have reviewed the patients History and Physical, chart, labs and discussed the procedure including the risks, benefits and alternatives for the proposed anesthesia with the patient or authorized representative who has indicated his/her understanding and acceptance.     Dental advisory given  Plan Discussed with:   Anesthesia Plan Comments:       Anesthesia Quick Evaluation

## 2018-12-01 NOTE — Transfer of Care (Signed)
Immediate Anesthesia Transfer of Care Note  Patient: Chavy [facility]  Procedure(s) Performed: CYSTOSCOPY WITH STENT PLACEMENT (Right )  Patient Location: PACU  Anesthesia Type:General  Level of Consciousness: awake, alert  and oriented  Airway & Oxygen Therapy: Patient Spontanous Breathing and Patient connected to face mask oxygen  Post-op Assessment: Report given to RN and Post -op Vital signs reviewed and stable  Post vital signs: Reviewed and stable  Last Vitals:  Vitals Value Taken Time  BP 178/75 12/01/18 1049  Temp    Pulse 69 12/01/18 1051  Resp 22 12/01/18 1051  SpO2 100 % 12/01/18 1051  Vitals shown include unvalidated device data.  Last Pain:  Vitals:   12/01/18 0811  TempSrc: Oral         Complications: No apparent anesthesia complications

## 2018-12-01 NOTE — Interval H&P Note (Signed)
History and Physical Interval Note:  12/01/2018 9:38 AM  [name] [name]  has presented today for surgery, with the diagnosis of RIGHT URETERAL STRICTURE.  The various methods of treatment have been discussed with the patient and family. After consideration of risks, benefits and other options for treatment, the patient has consented to  Procedure(s): CYSTOSCOPY WITH STENT PLACEMENT (Right) as a surgical intervention.  The patient's history has been reviewed, patient examined, no change in status, stable for surgery.  I have reviewed the patient's chart and labs.  Questions were answered to the patient's satisfaction.     Ardis Hughs

## 2018-12-02 ENCOUNTER — Encounter (HOSPITAL_COMMUNITY): Payer: Self-pay | Admitting: Urology

## 2018-12-12 ENCOUNTER — Emergency Department (HOSPITAL_COMMUNITY)
Admission: EM | Admit: 2018-12-12 | Discharge: 2018-12-12 | Disposition: A | Discharge status: Home / Self Care | Payer: Medicare Other | Attending: Emergency Medicine | Admitting: Emergency Medicine

## 2018-12-12 ENCOUNTER — Encounter (HOSPITAL_COMMUNITY): Payer: Self-pay

## 2018-12-12 ENCOUNTER — Other Ambulatory Visit: Payer: Self-pay

## 2018-12-12 DIAGNOSIS — I2510 Atherosclerotic heart disease of native coronary artery without angina pectoris: Secondary | ICD-10-CM | POA: Insufficient documentation

## 2018-12-12 DIAGNOSIS — Z87891 Personal history of nicotine dependence: Secondary | ICD-10-CM | POA: Insufficient documentation

## 2018-12-12 DIAGNOSIS — Z794 Long term (current) use of insulin: Secondary | ICD-10-CM | POA: Insufficient documentation

## 2018-12-12 DIAGNOSIS — I5042 Chronic combined systolic (congestive) and diastolic (congestive) heart failure: Secondary | ICD-10-CM | POA: Diagnosis not present

## 2018-12-12 DIAGNOSIS — R300 Dysuria: Secondary | ICD-10-CM | POA: Diagnosis present

## 2018-12-12 DIAGNOSIS — N390 Urinary tract infection, site not specified: Secondary | ICD-10-CM | POA: Insufficient documentation

## 2018-12-12 DIAGNOSIS — Z7982 Long term (current) use of aspirin: Secondary | ICD-10-CM | POA: Diagnosis not present

## 2018-12-12 DIAGNOSIS — E039 Hypothyroidism, unspecified: Secondary | ICD-10-CM | POA: Diagnosis not present

## 2018-12-12 DIAGNOSIS — I13 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 1 through stage 4 chronic kidney disease, or unspecified chronic kidney disease: Secondary | ICD-10-CM | POA: Diagnosis not present

## 2018-12-12 DIAGNOSIS — N183 Chronic kidney disease, stage 3 unspecified: Secondary | ICD-10-CM | POA: Diagnosis not present

## 2018-12-12 DIAGNOSIS — E1122 Type 2 diabetes mellitus with diabetic chronic kidney disease: Secondary | ICD-10-CM | POA: Insufficient documentation

## 2018-12-12 DIAGNOSIS — Z951 Presence of aortocoronary bypass graft: Secondary | ICD-10-CM | POA: Diagnosis not present

## 2018-12-12 LAB — URINALYSIS, ROUTINE W REFLEX MICROSCOPIC
Bilirubin Urine: NEGATIVE
Glucose, UA: NEGATIVE mg/dL
Ketones, ur: NEGATIVE mg/dL
Nitrite: NEGATIVE
Protein, ur: 30 mg/dL — AB
RBC / HPF: >50 RBC/hpf — ABNORMAL HIGH (ref 0–5)
Specific Gravity, Urine: 1.013 (ref 1.005–1.030)
WBC, UA: >50 WBC/hpf — ABNORMAL HIGH (ref 0–5)
pH: 5 (ref 5.0–8.0)

## 2018-12-12 MED ORDER — SULFAMETHOXAZOLE-TRIMETHOPRIM 800-160 MG PO TABS: 1.0000 | ORAL_TABLET | Freq: Two times a day (BID) | ORAL | 0 refills | Status: AC

## 2018-12-12 MED ORDER — CEPHALEXIN 500 MG PO CAPS
500.0000 mg | ORAL_CAPSULE | Freq: Once | ORAL | Status: AC
  Administered 2018-12-12: 14:00:00 500 mg via ORAL
  Filled 2018-12-12: qty 1

## 2018-12-12 MED ORDER — CEPHALEXIN 500 MG PO CAPS: 500.0000 mg | ORAL_CAPSULE | Freq: Three times a day (TID) | ORAL | 0 refills | Status: DC

## 2018-12-12 MED ORDER — OXYCODONE-ACETAMINOPHEN 5-325 MG PO TABS: 1.0000 | ORAL_TABLET | Freq: Three times a day (TID) | ORAL | 0 refills | Status: DC | PRN

## 2018-12-12 NOTE — ED Notes (Signed)
Urine culture sent to lab with UA sample 

## 2018-12-12 NOTE — ED Triage Notes (Signed)
Patient states, she has been fighting a UTI for 2 months. Patient states, :"I have been on several antibiotics and right now I am on a 90 day antibiotic treatment.I am burning so bad."

## 2018-12-12 NOTE — ED Provider Notes (Signed)
[facility] DEPT Provider Note   CSN: [id number] Arrival date & time: 12/12/18  5102     History   Chief Complaint Chief Complaint  Patient presents with  . Dysuria    HPI [name] [name] is a 78 y.o. female.     HPI   72yF with dysuria. Chronic ureteral stent for stricture. Most recently exchanged on 12/01/18. Has been having increasing pain/burning since. No fever. No v/d. On chronic abx and pyridium.   Past Medical History:  Diagnosis Date  . A-fib ([place])   . AAA (abdominal aortic aneurysm) ([place])   . Bilateral hydronephrosis   . Carotid artery stenosis    carotid doppler 06/2012 - Right CCA/Bulb/ICA with chronic occlusion; L vertebral artery with abnormal blood flow; L Bulb/Prox ICA  s/p endarterectomy with mild fibrous plaque, 50% diameter reduction  . CHF (congestive heart failure) ([place])   . Choledocholithiasis 2017  . CKD (chronic kidney disease), stage III   . Coronary artery disease due to lipid rich plaque cardiologist-  dr berry   s/p CABG x6 1997-- cath 12-09-2009 occluded vein to obtuse marginal branch and ramus branch with patent vien to PDA and patent LIMA to LAD, ef 40%-- Myoview 11-24-2011, nonischemic  . Diverticulosis   . Dyspnea on exertion   . GERD (gastroesophageal reflux disease)   . GI bleed   . History of sepsis    10-18-2014 w/ acute pyelonephritis  . Hyperlipidemia   . Hypertension   . Hypothyroidism   . Iron deficiency anemia   . Ischemic cardiomyopathy    03-25-2010-- per lasts echo EF  50-55%  . Ischemic cardiomyopathy   . PAD (peripheral artery disease) ([place])    09/2010 LEAs - R ABI of 0.45, occluded fem-pop bypass graft, L ABI of 0.59 with occluded SFA; severe arterial insuff  . PONV (postoperative nausea and vomiting)   . PVD (peripheral vascular disease) with claudication ([place])    last duplex 07-04-2015 -- Right CCA and ICA chronic occlusion, 58-52% LICA, Patent vertebral arteries w/ antegrade flow,  bilateral normal subclavian arteries   . Retroperitoneal fibrosis   . Tubular adenoma of colon   . Type 2 diabetes mellitus ([place])    monitored by dr Dwyane Dee    Patient Active Problem List   Diagnosis Date Noted  . Chronic prescription opiate use 10/20/2018  . Chronic diarrhea 07/17/2018  . Primary osteoarthritis of right knee 03/01/2018  . Closed fracture of lateral portion of right tibial plateau 03/01/2018  . Pressure injury of skin 01/25/2017  . UTI (urinary tract infection) 01/25/2017  . AKI (acute kidney injury) ([place]) 01/24/2017  . Fall 03/24/2016  . Atrial flutter ([place]) 01/26/2016  . Acute kidney injury superimposed on chronic kidney disease ([place]) 01/26/2016  . PAD (peripheral artery disease) ([place]) 01/26/2016  . Acute on chronic combined systolic and diastolic CHF  77/82/4235  . Melena   . Acute blood loss anemia   . GI bleed 10/18/2015  . Preop cardiovascular exam 09/04/2015  . Choledocholithiasis   . Pressure ulcer 08/29/2015  . Protein-calorie malnutrition, severe 08/29/2015  . Lung nodule   . Acute pyelonephritis 10/16/2014  . Hyperlipidemia 01/08/2014  . Type II or unspecified type diabetes mellitus with renal manifestations, uncontrolled(250.42) 01/10/2013  . Carotid artery disease ([place]) 01/03/2013  . DOE (dyspnea on exertion) 11/20/2012  . Claudication in peripheral vascular disease ([place]) 11/20/2012  . Bradycardia 11/20/2012  . Chronic kidney disease, stage III (moderate) ([place]) 11/08/2012  . Hyperlipidemia associated with  type 2 diabetes mellitus ([place]) 10/01/2012  . Hypothyroidism 10/01/2012  . Chronic combined systolic and diastolic congestive heart failure ([place]) 06/08/2012  . Abdominal pain 03/09/2012  . Altered mental status 03/09/2012  . Diabetes mellitus, type II ([place]) 03/09/2012  . Generalized osteoarthritis of multiple sites 03/09/2012  . HTN (hypertension) 03/09/2012  . Acute encephalopathy 03/09/2012  . Normocytic anemia 03/09/2012  . Elevated LFTs  03/09/2012  . AAA (abdominal aortic aneurysm) ([place]) 03/09/2012  . Lower urinary tract infectious disease 03/08/2012  . Hx of CABG '97 03/08/2012    Past Surgical History:  Procedure Laterality Date  . AORTA - BILATERAL FEMORAL ARTERY BYPASS GRAFT  1997   and RIGHT FEM-POP   . CARDIAC CATHETERIZATION  12-09-2009  dr al little   EF >40%-- occluded vein to OM & ramus branches, patent vein to PDA, patent LIMA to LAD (Dr. [name], [facility]) - later had thrombectomy of R fem-pop bypass ad R common femoral & profunda femoris artery (Dr. Oneida Alar)  . CARDIOVASCULAR STRESS TEST  11-24-2011   dr berry   Low Risk study: fixed basal to mid inferior attenuation artifact, no reversible ischemia,  normal LV function and wall motion , ef 67%  . CAROTID ENDARTERECTOMY Bilateral right 1994//  left ?  Marland Kitchen CATARACT EXTRACTION W/ INTRAOCULAR LENS  IMPLANT, BILATERAL  2006  . CHOLECYSTECTOMY N/A 09/05/2015   Procedure: ATTEMPTED LAPAROSCOPIC CHOLECYSTECTOMY, EXPLORATORY LAPAROTOMY WITH CHOLECYSTECTOMY;  Surgeon: Autumn Messing III, MD;  Location: [facility];  Service: General;  Laterality: N/A;  . COLONOSCOPY    . COLONOSCOPY  10/21/2015  . COLONOSCOPY WITH PROPOFOL N/A 10/21/2015   Procedure: COLONOSCOPY WITH PROPOFOL;  Surgeon: Milus Banister, MD;  Location: [facility];  Service: Endoscopy;  Laterality: N/A;  . CORONARY ARTERY BYPASS GRAFT  1997   x6; internal mammary to LAD, SVG to ramus #1 & #2, SVG to OM, SVG to PDA,   . CYSTOSCOPY W/ RETROGRADES Right 08/01/2015   Procedure: CYSTOSCOPY WITH RETROGRADE PYELOGRAM;  Surgeon: Ardis Hughs, MD;  Location: [facility];  Service: Urology;  Laterality: Right;  . CYSTOSCOPY W/ URETERAL STENT PLACEMENT  03/10/2012   Procedure: CYSTOSCOPY WITH RETROGRADE PYELOGRAM/URETERAL STENT PLACEMENT;  Surgeon: Hanley Ben, MD;  Location: [facility];  Service: Urology;  Laterality: Left;  . CYSTOSCOPY W/ URETERAL STENT PLACEMENT Bilateral 03/07/2015   Procedure: BILATERAL  RETROGRADE PYELOGRAM AND RIGHT URETERAL STENT PLACEMENT;  Surgeon: Ardis Hughs, MD;  Location: [facility];  Service: Urology;  Laterality: Bilateral;  . CYSTOSCOPY W/ URETERAL STENT PLACEMENT Right 08/01/2015   Procedure: CYSTOSCOPY WITH STENT REPLACEMENT;  Surgeon: Ardis Hughs, MD;  Location: [facility];  Service: Urology;  Laterality: Right;  . CYSTOSCOPY W/ URETERAL STENT PLACEMENT Right 02/13/2016   Procedure: RIGHT URETERAL STENT EXCHANGE;  Surgeon: Ardis Hughs, MD;  Location: [facility];  Service: Urology;  Laterality: Right;  . CYSTOSCOPY W/ URETERAL STENT PLACEMENT Right 09/17/2016   Procedure: CYSTOSCOPY WITH RIGHT  RETROGRADE PYELOGRAM RIGHT URETERAL STENT EXCHANGE;  Surgeon: Ardis Hughs, MD;  Location: [facility];  Service: Urology;  Laterality: Right;  . CYSTOSCOPY W/ URETERAL STENT PLACEMENT Bilateral 03/18/2017   Procedure: CYSTOSCOPY WITH BILATERAL  RETROGRADE PYELOGRAM RIGHT URETERAL STENT [place];  Surgeon: Ardis Hughs, MD;  Location: [facility];  Service: Urology;  Laterality: Bilateral;  . CYSTOSCOPY W/ URETERAL STENT PLACEMENT Right 05/26/2018   Procedure: CYSTOSCOPY WITH RIGHT RETROGRADE PYELOGRAM RIGHT URETERAL STENT EXCHANGE;  Surgeon: Ardis Hughs, MD;  [facility];  Service: Urology;  Laterality: Right;  . CYSTOSCOPY WITH STENT PLACEMENT Right 10/06/2017   Procedure: CYSTOSCOPY, RETROGRADE  WITH RIGHT STENT EXCHANGE;  Surgeon: Ardis Hughs, MD;  Location[facility];  Service: Urology;  Laterality: Right;  . CYSTOSCOPY WITH STENT PLACEMENT Right 12/01/2018   Procedure: CYSTOSCOPY WITH STENT PLACEMENT;  Surgeon: Ardis Hughs, MD;  [facility];  Service: Urology;  Laterality: Right;  . ERCP N/A 09/03/2015   Procedure: ENDOSCOPIC RETROGRADE CHOLANGIOPANCREATOGRAPHY (ERCP);  Surgeon: Irene Shipper, MD;  Location: [facility];  Service: Endoscopy;  Laterality: N/A;  . ESOPHAGOGASTRODUODENOSCOPY     . ESOPHAGOGASTRODUODENOSCOPY N/A 10/19/2015   Procedure: ESOPHAGOGASTRODUODENOSCOPY (EGD);  Surgeon: Gatha Mayer, MD;  Location: [facility];  Service: Endoscopy;  Laterality: N/A;  . GIVENS CAPSULE STUDY  10/21/2015  . GIVENS CAPSULE STUDY N/A 10/21/2015   Procedure: GIVENS CAPSULE STUDY;  Surgeon: Milus Banister, MD;  Location: [facility];  Service: Endoscopy;  Laterality: N/A;  . KNEE SURGERY Right 03-01-2018-03-04-2018   PLATEAU FRACTURE HOSPITLIZATION   . REPAIR RIGHT FEMORAL PSEUDOANEUYSM/  RIGHT FEM-POP BYPASS GRAFT/  DEBRIDEMENT RIGHT LOWER EXTREMITIY VENOUS STATUS ULCERS X2  01-12-2005  . TOTAL ABDOMINAL HYSTERECTOMY W/ BILATERAL SALPINGOOPHORECTOMY  1986  . TRANSTHORACIC ECHOCARDIOGRAM  03/25/2010   EF 40-34%, LV systolic function low normal with mild inferoseptal hypocontractility; LA mildly dilated; mod MR; mild TR, RV systolic pressure elevated, mild pulm HTN; AV mildly sclerotic; mild pulm valve regurg; aortic root sclerosis/calcif      OB History   No obstetric history on file.      Home Medications    Prior to Admission medications   Medication Sig Start Date End Date Taking? Authorizing Provider  acetaminophen (TYLENOL) 650 MG CR tablet Take 1,300 mg by mouth every 8 (eight) hours as needed for pain.    [provider]  albuterol (PROVENTIL HFA;VENTOLIN HFA) 108 (90 Base) MCG/ACT inhaler INHALE 2 PUFFS INTO THE LUNGS EVERY 6 HOURS AS NEEDED FOR WHEEZING OR SHORTNESS OF BREATH Patient taking differently: Inhale 2 puffs into the lungs every 6 (six) hours as needed for wheezing or shortness of breath.  01/23/18   [name], MD  aspirin 81 MG chewable tablet Chew 81 mg by mouth daily.     [provider]  atorvastatin (LIPITOR) 80 MG tablet Take 1 tablet (80 mg total) by mouth daily. Patient taking differently: Take 80 mg by mouth every evening.  11/15/18   [name], MD  calcium-vitamin D (OSCAL WITH D) 500-200 MG-UNIT per tablet Take 1 tablet by  mouth daily. In the morning    [provider]  clopidogrel (PLAVIX) 75 MG tablet Take 1 tablet (75 mg total) by mouth daily. Take 1 tablet by mouth once daily.   Resume once there is no longer blood in your urine for at least 48 hours. 12/01/18   Ardis Hughs, MD  docusate sodium (COLACE) 100 MG capsule Take 100 mg by mouth 2 (two) times daily.    [provider]  fenofibrate 54 MG tablet Take 1 tablet (54 mg total) by mouth daily. Patient not taking: Reported on 11/17/2018 09/07/18   [name], MD  ferrous sulfate 325 (65 FE) MG tablet Take 325 mg by mouth every evening.     [provider]  furosemide (LASIX) 20 MG tablet TAKE 1 TABLET(20 MG) BY MOUTH DAILY 11/24/18   [name], MD  glucose blood (ONE TOUCH ULTRA TEST) test strip USE TO  TEST BLOOD SUGAR 1 TO 2 TIMES DAILY AS DIRECTED 02/13/18   [name], MD  HYDROcodone-acetaminophen (NORCO/VICODIN) 5-325 MG tablet Take 1 tablet by mouth 2 (two) times daily as needed for moderate pain or severe pain. 10/20/18   [place], Betty G, MD  insulin detemir (LEVEMIR) 100 UNIT/ML injection Inject 12 Units into the skin at bedtime. Inject 12 units under the skin at bedtime.    [provider]  Insulin Pen Needle 32G X 4 MM MISC Use 4 times daily 04/29/17   [name], MD  isosorbide mononitrate (IMDUR) 30 MG 24 hr tablet TAKE 1 TABLET BY MOUTH EVERY DAY Patient taking differently: Take 30 mg by mouth daily.  05/17/18   [name], MD  latanoprost (XALATAN) 0.005 % ophthalmic solution Place 1 drop into both eyes at bedtime. 03/11/14   [provider]  levothyroxine (SYNTHROID) 100 MCG tablet Take 1 tablet (100 mcg total) by mouth daily. Patient taking differently: Take 100-150 mcg by mouth See admin instructions. Take 1.5 tablets (150 mcg) by mouth on Sundays, take 1 tablet (100 mcg) by mouth on all other days of the week 09/07/18   [name], MD  lidocaine (LIDODERM) 5 % Place 1 patch onto the skin  daily. Remove & Discard patch within 12 hours or as directed by MD Patient taking differently: Place 1 patch onto the skin daily as needed (pain). Remove & Discard patch within 12 hours or as directed by MD 01/27/17   Domenic Polite, MD  loperamide (IMODIUM A-D) 2 MG tablet Take 2 mg by mouth as needed for diarrhea or loose stools.     [provider]  NOVOLOG FLEXPEN 100 UNIT/ML FlexPen INJECT 8 UNITS 3 TIMES A   DAY WITH MEALS Patient taking differently: Inject 2 Units into the skin See admin instructions. Inject 2 units under the skin once daily before supper (scheduled), may take with other meals as needed for elevated blood glucose levels. 05/09/17   [name], MD  omeprazole (PRILOSEC) 20 MG capsule Take 1 capsule (20 mg total) by mouth daily. Patient not taking: Reported on 11/17/2018 09/15/18   [name], MD  phenazopyridine (PYRIDIUM) 200 MG tablet Take 1 tablet (200 mg total) by mouth 3 (three) times daily as needed for pain. 12/01/18   Ardis Hughs, MD  trolamine salicylate (ASPERCREME) 10 % cream Apply 1 application topically 4 (four) times daily as needed (arthritis pain.).    [provider]  vitamin B-12 (CYANOCOBALAMIN) 1000 MCG tablet Take 1,000 mcg by mouth every evening.     [provider]  vitamin C (ASCORBIC ACID) 500 MG tablet Take 500 mg by mouth at bedtime.     [provider]  Vitamin D, Ergocalciferol, (DRISDOL) 1.25 MG (50000 UT) CAPS capsule TAKE ONE CAPSULE BY MOUTH EVERY 7 DAYS(ON THURSDAYS) 11/27/18   [name], MD    Family History Family History  Problem Relation Age of Onset  . Congestive Heart Failure Mother   . Diabetes Mother   . Stroke Father   . Cancer Maternal Aunt        Breast cancer    Social History Social History   Tobacco Use  . Smoking status: Former Smoker    Packs/day: 2.00    Years: 30.00    Pack years: 60.00    Quit date: 06/07/2009    Years since quitting: 9.5  . Smokeless tobacco: Never  Used  Substance Use Topics  . Alcohol use: Yes  Alcohol/week: 3.0 standard drinks    Types: 3 Standard drinks or equivalent per week    Comment: ocassional  . Drug use: No     Allergies   Patient has no known allergies.   Review of Systems Review of Systems  All systems reviewed and negative, other than as noted in HPI.  Physical Exam Updated Vital Signs BP 106/63 (BP Location: Left Arm)   Pulse 79   Temp 98.1 F (36.7 C) (Oral)   Resp 20   Ht 5\' 6"  (1.676 m)   Wt 56.9 kg   SpO2 95%   BMI 20.26 kg/m   Physical Exam Vitals signs and nursing note reviewed.  Constitutional:      General: She is not in acute distress.    Appearance: She is well-developed.  HENT:     Head: Normocephalic and atraumatic.  Eyes:     General:        Right eye: No discharge.        Left eye: No discharge.     Conjunctiva/sclera: Conjunctivae normal.  Neck:     Musculoskeletal: Neck supple.  Cardiovascular:     Rate and Rhythm: Normal rate and regular rhythm.     Heart sounds: Normal heart sounds. No murmur. No friction rub. No gallop.   Pulmonary:     Effort: Pulmonary effort is normal. No respiratory distress.     Breath sounds: Normal breath sounds.  Abdominal:     General: There is no distension.     Palpations: Abdomen is soft.     Tenderness: There is abdominal tenderness. There is no guarding or rebound.     Comments: Mild suprapubic TTP  Musculoskeletal:        General: No tenderness.  Skin:    General: Skin is warm and dry.  Neurological:     Mental Status: She is alert.  Psychiatric:        Behavior: Behavior normal.        Thought Content: Thought content normal.      ED Treatments / Results  Labs (all labs ordered are listed, but only abnormal results are displayed) Labs Reviewed  URINALYSIS, ROUTINE W REFLEX MICROSCOPIC - Abnormal; Notable for the following components:      Result Value   APPearance HAZY (*)    Hgb urine dipstick LARGE (*)    Protein,  ur 30 (*)    Leukocytes,Ua LARGE (*)    RBC / HPF >50 (*)    WBC, UA >50 (*)    Bacteria, UA RARE (*)    All other components within normal limits  URINE CULTURE    EKG None  Radiology No results found.  Procedures Procedures (including critical care time)  Medications Ordered in ED Medications - No data to display   Initial Impression / Assessment and Plan / ED Course  I have reviewed the triage vital signs and the nursing notes.  Pertinent labs & imaging results that were available during my care of the patient were reviewed by me and considered in my medical decision making (see chart for details).       69yF with UTI in setting of chronic ureteral stent.  Will place on antibiotics.  She is afebrile.  Generally well-appearing.  She has established urologic care.  Follow-up with them.  Return precautions discussed.  Final Clinical Impressions(s) / ED Diagnoses   Final diagnoses:  Lower urinary tract infectious disease    ED Discharge Orders    None  Virgel Manifold, MD 12/14/18 1038

## 2018-12-13 LAB — URINE CULTURE: Culture: NO GROWTH

## 2018-12-15 DIAGNOSIS — R102 Pelvic and perineal pain: Secondary | ICD-10-CM | POA: Diagnosis not present

## 2018-12-15 DIAGNOSIS — K5909 Other constipation: Secondary | ICD-10-CM | POA: Diagnosis not present

## 2018-12-15 DIAGNOSIS — N135 Crossing vessel and stricture of ureter without hydronephrosis: Secondary | ICD-10-CM | POA: Diagnosis not present

## 2018-12-18 DIAGNOSIS — Z7189 Other specified counseling: Secondary | ICD-10-CM | POA: Diagnosis not present

## 2018-12-18 DIAGNOSIS — L89320 Pressure ulcer of left buttock, unstageable: Secondary | ICD-10-CM | POA: Diagnosis not present

## 2018-12-18 DIAGNOSIS — M7918 Myalgia, other site: Secondary | ICD-10-CM | POA: Diagnosis not present

## 2018-12-18 DIAGNOSIS — L89329 Pressure ulcer of left buttock, unspecified stage: Secondary | ICD-10-CM | POA: Diagnosis not present

## 2018-12-18 DIAGNOSIS — M810 Age-related osteoporosis without current pathological fracture: Secondary | ICD-10-CM | POA: Diagnosis not present

## 2018-12-19 DIAGNOSIS — M25552 Pain in left hip: Secondary | ICD-10-CM | POA: Diagnosis not present

## 2018-12-19 DIAGNOSIS — T83193S Other mechanical complication of other urinary stent, sequela: Secondary | ICD-10-CM | POA: Diagnosis not present

## 2018-12-25 ENCOUNTER — Other Ambulatory Visit: Payer: Self-pay | Admitting: Family Medicine

## 2018-12-25 DIAGNOSIS — M5459 Other low back pain: Secondary | ICD-10-CM | POA: Insufficient documentation

## 2018-12-25 DIAGNOSIS — M545 Low back pain: Secondary | ICD-10-CM | POA: Diagnosis not present

## 2018-12-25 DIAGNOSIS — M48061 Spinal stenosis, lumbar region without neurogenic claudication: Secondary | ICD-10-CM | POA: Diagnosis not present

## 2018-12-25 DIAGNOSIS — R300 Dysuria: Secondary | ICD-10-CM | POA: Diagnosis not present

## 2018-12-25 DIAGNOSIS — M1711 Unilateral primary osteoarthritis, right knee: Secondary | ICD-10-CM | POA: Diagnosis not present

## 2018-12-25 DIAGNOSIS — Z79891 Long term (current) use of opiate analgesic: Secondary | ICD-10-CM

## 2018-12-25 DIAGNOSIS — Z951 Presence of aortocoronary bypass graft: Secondary | ICD-10-CM | POA: Diagnosis not present

## 2018-12-25 DIAGNOSIS — M810 Age-related osteoporosis without current pathological fracture: Secondary | ICD-10-CM | POA: Diagnosis not present

## 2018-12-25 DIAGNOSIS — E1122 Type 2 diabetes mellitus with diabetic chronic kidney disease: Secondary | ICD-10-CM | POA: Diagnosis not present

## 2018-12-25 DIAGNOSIS — I2510 Atherosclerotic heart disease of native coronary artery without angina pectoris: Secondary | ICD-10-CM | POA: Diagnosis not present

## 2018-12-25 DIAGNOSIS — N183 Chronic kidney disease, stage 3 unspecified: Secondary | ICD-10-CM | POA: Diagnosis not present

## 2018-12-25 DIAGNOSIS — M5432 Sciatica, left side: Secondary | ICD-10-CM | POA: Diagnosis not present

## 2018-12-25 DIAGNOSIS — L89329 Pressure ulcer of left buttock, unspecified stage: Secondary | ICD-10-CM | POA: Diagnosis not present

## 2018-12-25 DIAGNOSIS — M5126 Other intervertebral disc displacement, lumbar region: Secondary | ICD-10-CM | POA: Diagnosis not present

## 2018-12-25 DIAGNOSIS — N132 Hydronephrosis with renal and ureteral calculous obstruction: Secondary | ICD-10-CM | POA: Diagnosis not present

## 2018-12-25 DIAGNOSIS — M25552 Pain in left hip: Secondary | ICD-10-CM | POA: Diagnosis not present

## 2018-12-25 DIAGNOSIS — N390 Urinary tract infection, site not specified: Secondary | ICD-10-CM | POA: Diagnosis not present

## 2018-12-25 DIAGNOSIS — M159 Polyosteoarthritis, unspecified: Secondary | ICD-10-CM

## 2018-12-25 DIAGNOSIS — N1330 Unspecified hydronephrosis: Secondary | ICD-10-CM | POA: Diagnosis not present

## 2018-12-25 DIAGNOSIS — M5417 Radiculopathy, lumbosacral region: Secondary | ICD-10-CM | POA: Diagnosis not present

## 2018-12-25 DIAGNOSIS — Z794 Long term (current) use of insulin: Secondary | ICD-10-CM | POA: Diagnosis not present

## 2018-12-25 LAB — HEPATIC FUNCTION PANEL
ALT: 13 (ref 7–35)
AST: 14 (ref 13–35)
Alkaline Phosphatase: 104 (ref 25–125)

## 2018-12-25 LAB — BASIC METABOLIC PANEL
BUN: 36 — AB (ref 4–21)
Creatinine: 1.6 — AB (ref 0.5–1.1)
Glucose: 110
Potassium: 5.2 (ref 3.4–5.3)
Sodium: 138 (ref 137–147)

## 2018-12-25 LAB — CBC AND DIFFERENTIAL
HCT: 30 — AB (ref 36–46)
Hemoglobin: 9.7 — AB (ref 12.0–16.0)
Platelets: 401 — AB (ref 150–399)
WBC: 14.7

## 2018-12-25 NOTE — Telephone Encounter (Signed)
Requested medication (s) are due for refill today: yes  Requested medication (s) are on the active medication list: yes  Last refill:  10/20/2018  Future visit scheduled: yes  Notes to clinic:  Refill cannot be delegated    Requested Prescriptions  Pending Prescriptions Disp Refills   HYDROcodone-acetaminophen (NORCO/VICODIN) 5-325 MG tablet 60 tablet 0    Sig: Take 1 tablet by mouth 2 (two) times daily as needed for moderate pain or severe pain.     Not Delegated - Analgesics:  Opioid Agonist Combinations Failed - 12/25/2018  9:23 AM      Failed - This refill cannot be delegated      Failed - Urine Drug Screen completed in last 360 days.      Passed - Valid encounter within last 6 months    Recent Outpatient Visits          2 months ago Essential hypertension   Therapist, music at [facility] [place], Malka So, MD   5 months ago Hyperlipidemia associated with type 2 diabetes mellitus ([place])   Therapist, [facility] at [facility] [place], Malka So, MD      Future Appointments            In 1 month [place], Malka So, MD [employer] [facility] [place], [facility]

## 2018-12-25 NOTE — Telephone Encounter (Signed)
Patient would like a refill on her HYDROcodone-acetaminophen (NORCO/VICODIN) 5-325 MG tablet medication and have it sent to her preferred pharmacy temporarily [facility] on [address] and [address]. [phone]

## 2018-12-26 DIAGNOSIS — B9689 Other specified bacterial agents as the cause of diseases classified elsewhere: Secondary | ICD-10-CM | POA: Diagnosis not present

## 2018-12-26 DIAGNOSIS — Z87440 Personal history of urinary (tract) infections: Secondary | ICD-10-CM | POA: Diagnosis not present

## 2018-12-26 DIAGNOSIS — E785 Hyperlipidemia, unspecified: Secondary | ICD-10-CM | POA: Diagnosis present

## 2018-12-26 DIAGNOSIS — E1129 Type 2 diabetes mellitus with other diabetic kidney complication: Secondary | ICD-10-CM | POA: Diagnosis not present

## 2018-12-26 DIAGNOSIS — E1122 Type 2 diabetes mellitus with diabetic chronic kidney disease: Secondary | ICD-10-CM | POA: Diagnosis present

## 2018-12-26 DIAGNOSIS — Z79891 Long term (current) use of opiate analgesic: Secondary | ICD-10-CM | POA: Diagnosis not present

## 2018-12-26 DIAGNOSIS — I129 Hypertensive chronic kidney disease with stage 1 through stage 4 chronic kidney disease, or unspecified chronic kidney disease: Secondary | ICD-10-CM | POA: Diagnosis present

## 2018-12-26 DIAGNOSIS — R2681 Unsteadiness on feet: Secondary | ICD-10-CM | POA: Diagnosis not present

## 2018-12-26 DIAGNOSIS — N183 Chronic kidney disease, stage 3 unspecified: Secondary | ICD-10-CM | POA: Diagnosis present

## 2018-12-26 DIAGNOSIS — R52 Pain, unspecified: Secondary | ICD-10-CM | POA: Diagnosis not present

## 2018-12-26 DIAGNOSIS — R911 Solitary pulmonary nodule: Secondary | ICD-10-CM | POA: Diagnosis present

## 2018-12-26 DIAGNOSIS — Z20828 Contact with and (suspected) exposure to other viral communicable diseases: Secondary | ICD-10-CM | POA: Diagnosis present

## 2018-12-26 DIAGNOSIS — R41841 Cognitive communication deficit: Secondary | ICD-10-CM | POA: Diagnosis not present

## 2018-12-26 DIAGNOSIS — M4807 Spinal stenosis, lumbosacral region: Secondary | ICD-10-CM | POA: Diagnosis not present

## 2018-12-26 DIAGNOSIS — I714 Abdominal aortic aneurysm, without rupture: Secondary | ICD-10-CM | POA: Diagnosis not present

## 2018-12-26 DIAGNOSIS — Z79899 Other long term (current) drug therapy: Secondary | ICD-10-CM | POA: Diagnosis not present

## 2018-12-26 DIAGNOSIS — I69828 Other speech and language deficits following other cerebrovascular disease: Secondary | ICD-10-CM | POA: Diagnosis not present

## 2018-12-26 DIAGNOSIS — M48061 Spinal stenosis, lumbar region without neurogenic claudication: Secondary | ICD-10-CM | POA: Diagnosis present

## 2018-12-26 DIAGNOSIS — E1151 Type 2 diabetes mellitus with diabetic peripheral angiopathy without gangrene: Secondary | ICD-10-CM | POA: Diagnosis present

## 2018-12-26 DIAGNOSIS — Z87891 Personal history of nicotine dependence: Secondary | ICD-10-CM | POA: Diagnosis not present

## 2018-12-26 DIAGNOSIS — I739 Peripheral vascular disease, unspecified: Secondary | ICD-10-CM | POA: Diagnosis not present

## 2018-12-26 DIAGNOSIS — N1330 Unspecified hydronephrosis: Secondary | ICD-10-CM | POA: Diagnosis not present

## 2018-12-26 DIAGNOSIS — Z90710 Acquired absence of both cervix and uterus: Secondary | ICD-10-CM | POA: Diagnosis not present

## 2018-12-26 DIAGNOSIS — R531 Weakness: Secondary | ICD-10-CM | POA: Diagnosis not present

## 2018-12-26 DIAGNOSIS — I2510 Atherosclerotic heart disease of native coronary artery without angina pectoris: Secondary | ICD-10-CM | POA: Diagnosis not present

## 2018-12-26 DIAGNOSIS — N390 Urinary tract infection, site not specified: Secondary | ICD-10-CM | POA: Insufficient documentation

## 2018-12-26 DIAGNOSIS — M5489 Other dorsalgia: Secondary | ICD-10-CM | POA: Diagnosis not present

## 2018-12-26 DIAGNOSIS — R279 Unspecified lack of coordination: Secondary | ICD-10-CM | POA: Diagnosis not present

## 2018-12-26 DIAGNOSIS — M5126 Other intervertebral disc displacement, lumbar region: Secondary | ICD-10-CM | POA: Diagnosis present

## 2018-12-26 DIAGNOSIS — M1711 Unilateral primary osteoarthritis, right knee: Secondary | ICD-10-CM | POA: Diagnosis present

## 2018-12-26 DIAGNOSIS — E039 Hypothyroidism, unspecified: Secondary | ICD-10-CM | POA: Diagnosis present

## 2018-12-26 DIAGNOSIS — Z960 Presence of urogenital implants: Secondary | ICD-10-CM | POA: Diagnosis present

## 2018-12-26 DIAGNOSIS — Z955 Presence of coronary angioplasty implant and graft: Secondary | ICD-10-CM | POA: Diagnosis not present

## 2018-12-26 DIAGNOSIS — E1165 Type 2 diabetes mellitus with hyperglycemia: Secondary | ICD-10-CM | POA: Diagnosis present

## 2018-12-26 DIAGNOSIS — M810 Age-related osteoporosis without current pathological fracture: Secondary | ICD-10-CM | POA: Diagnosis present

## 2018-12-26 DIAGNOSIS — N132 Hydronephrosis with renal and ureteral calculous obstruction: Secondary | ICD-10-CM | POA: Diagnosis present

## 2018-12-26 DIAGNOSIS — L89159 Pressure ulcer of sacral region, unspecified stage: Secondary | ICD-10-CM | POA: Insufficient documentation

## 2018-12-26 DIAGNOSIS — M545 Low back pain: Secondary | ICD-10-CM | POA: Diagnosis not present

## 2018-12-26 DIAGNOSIS — N135 Crossing vessel and stricture of ureter without hydronephrosis: Secondary | ICD-10-CM | POA: Diagnosis not present

## 2018-12-26 DIAGNOSIS — A419 Sepsis, unspecified organism: Secondary | ICD-10-CM | POA: Diagnosis not present

## 2018-12-26 DIAGNOSIS — M6281 Muscle weakness (generalized): Secondary | ICD-10-CM | POA: Diagnosis not present

## 2018-12-26 DIAGNOSIS — D72829 Elevated white blood cell count, unspecified: Secondary | ICD-10-CM | POA: Diagnosis not present

## 2018-12-26 DIAGNOSIS — G894 Chronic pain syndrome: Secondary | ICD-10-CM | POA: Diagnosis not present

## 2018-12-26 DIAGNOSIS — Z951 Presence of aortocoronary bypass graft: Secondary | ICD-10-CM | POA: Diagnosis not present

## 2018-12-26 DIAGNOSIS — Z743 Need for continuous supervision: Secondary | ICD-10-CM | POA: Diagnosis not present

## 2018-12-26 DIAGNOSIS — I25810 Atherosclerosis of coronary artery bypass graft(s) without angina pectoris: Secondary | ICD-10-CM | POA: Diagnosis not present

## 2018-12-26 DIAGNOSIS — M5417 Radiculopathy, lumbosacral region: Secondary | ICD-10-CM | POA: Diagnosis present

## 2018-12-26 DIAGNOSIS — Z9049 Acquired absence of other specified parts of digestive tract: Secondary | ICD-10-CM | POA: Diagnosis not present

## 2018-12-27 NOTE — Telephone Encounter (Signed)
It seems like she filled a Rx for Oxycodone from a different prescriber.Can you please inquire about this because she is not supposed to have controlled meds from a different provider.  [I thought we signed a med contract in 10/2018,I cannot find it.]  Thanks, BJ

## 2018-12-28 LAB — CBC AND DIFFERENTIAL
HCT: 26 — AB (ref 36–46)
Hemoglobin: 8.3 — AB (ref 12.0–16.0)
Platelets: 344 (ref 150–399)
WBC: 13.2

## 2018-12-29 ENCOUNTER — Other Ambulatory Visit: Payer: Self-pay | Admitting: Internal Medicine

## 2018-12-29 ENCOUNTER — Other Ambulatory Visit: Payer: Medicare Other

## 2018-12-29 DIAGNOSIS — G894 Chronic pain syndrome: Secondary | ICD-10-CM | POA: Diagnosis not present

## 2018-12-29 DIAGNOSIS — R52 Pain, unspecified: Secondary | ICD-10-CM | POA: Diagnosis not present

## 2018-12-29 DIAGNOSIS — D72829 Elevated white blood cell count, unspecified: Secondary | ICD-10-CM | POA: Diagnosis not present

## 2018-12-29 DIAGNOSIS — N183 Chronic kidney disease, stage 3 unspecified: Secondary | ICD-10-CM | POA: Diagnosis not present

## 2018-12-29 DIAGNOSIS — Z79891 Long term (current) use of opiate analgesic: Secondary | ICD-10-CM | POA: Diagnosis not present

## 2018-12-29 DIAGNOSIS — N390 Urinary tract infection, site not specified: Secondary | ICD-10-CM | POA: Diagnosis not present

## 2018-12-29 DIAGNOSIS — M6281 Muscle weakness (generalized): Secondary | ICD-10-CM | POA: Diagnosis not present

## 2018-12-29 DIAGNOSIS — N135 Crossing vessel and stricture of ureter without hydronephrosis: Secondary | ICD-10-CM | POA: Diagnosis not present

## 2018-12-29 DIAGNOSIS — E875 Hyperkalemia: Secondary | ICD-10-CM | POA: Diagnosis not present

## 2018-12-29 DIAGNOSIS — E039 Hypothyroidism, unspecified: Secondary | ICD-10-CM | POA: Diagnosis not present

## 2018-12-29 DIAGNOSIS — D649 Anemia, unspecified: Secondary | ICD-10-CM | POA: Diagnosis not present

## 2018-12-29 DIAGNOSIS — N3001 Acute cystitis with hematuria: Secondary | ICD-10-CM | POA: Diagnosis not present

## 2018-12-29 DIAGNOSIS — M159 Polyosteoarthritis, unspecified: Secondary | ICD-10-CM | POA: Diagnosis not present

## 2018-12-29 DIAGNOSIS — J069 Acute upper respiratory infection, unspecified: Secondary | ICD-10-CM | POA: Diagnosis not present

## 2018-12-29 DIAGNOSIS — M48061 Spinal stenosis, lumbar region without neurogenic claudication: Secondary | ICD-10-CM | POA: Diagnosis not present

## 2018-12-29 DIAGNOSIS — I714 Abdominal aortic aneurysm, without rupture: Secondary | ICD-10-CM | POA: Diagnosis not present

## 2018-12-29 DIAGNOSIS — I129 Hypertensive chronic kidney disease with stage 1 through stage 4 chronic kidney disease, or unspecified chronic kidney disease: Secondary | ICD-10-CM | POA: Diagnosis not present

## 2018-12-29 DIAGNOSIS — M545 Low back pain: Secondary | ICD-10-CM | POA: Diagnosis not present

## 2018-12-29 DIAGNOSIS — M533 Sacrococcygeal disorders, not elsewhere classified: Secondary | ICD-10-CM | POA: Diagnosis not present

## 2018-12-29 DIAGNOSIS — I739 Peripheral vascular disease, unspecified: Secondary | ICD-10-CM | POA: Diagnosis not present

## 2018-12-29 DIAGNOSIS — N179 Acute kidney failure, unspecified: Secondary | ICD-10-CM | POA: Diagnosis not present

## 2018-12-29 DIAGNOSIS — N1330 Unspecified hydronephrosis: Secondary | ICD-10-CM | POA: Diagnosis not present

## 2018-12-29 DIAGNOSIS — R41841 Cognitive communication deficit: Secondary | ICD-10-CM | POA: Diagnosis not present

## 2018-12-29 DIAGNOSIS — I25118 Atherosclerotic heart disease of native coronary artery with other forms of angina pectoris: Secondary | ICD-10-CM | POA: Diagnosis not present

## 2018-12-29 DIAGNOSIS — Z743 Need for continuous supervision: Secondary | ICD-10-CM | POA: Diagnosis not present

## 2018-12-29 DIAGNOSIS — E785 Hyperlipidemia, unspecified: Secondary | ICD-10-CM | POA: Diagnosis not present

## 2018-12-29 DIAGNOSIS — D509 Iron deficiency anemia, unspecified: Secondary | ICD-10-CM | POA: Diagnosis not present

## 2018-12-29 DIAGNOSIS — M5489 Other dorsalgia: Secondary | ICD-10-CM | POA: Diagnosis not present

## 2018-12-29 DIAGNOSIS — E1129 Type 2 diabetes mellitus with other diabetic kidney complication: Secondary | ICD-10-CM | POA: Diagnosis not present

## 2018-12-29 DIAGNOSIS — Z794 Long term (current) use of insulin: Secondary | ICD-10-CM | POA: Diagnosis not present

## 2018-12-29 DIAGNOSIS — R2681 Unsteadiness on feet: Secondary | ICD-10-CM | POA: Diagnosis not present

## 2018-12-29 DIAGNOSIS — I69828 Other speech and language deficits following other cerebrovascular disease: Secondary | ICD-10-CM | POA: Diagnosis not present

## 2018-12-29 DIAGNOSIS — E1165 Type 2 diabetes mellitus with hyperglycemia: Secondary | ICD-10-CM | POA: Diagnosis not present

## 2018-12-29 DIAGNOSIS — B9689 Other specified bacterial agents as the cause of diseases classified elsewhere: Secondary | ICD-10-CM | POA: Diagnosis not present

## 2018-12-29 DIAGNOSIS — R279 Unspecified lack of coordination: Secondary | ICD-10-CM | POA: Diagnosis not present

## 2018-12-29 DIAGNOSIS — E1169 Type 2 diabetes mellitus with other specified complication: Secondary | ICD-10-CM | POA: Diagnosis not present

## 2018-12-29 DIAGNOSIS — M4726 Other spondylosis with radiculopathy, lumbar region: Secondary | ICD-10-CM | POA: Diagnosis not present

## 2018-12-29 DIAGNOSIS — M5417 Radiculopathy, lumbosacral region: Secondary | ICD-10-CM | POA: Diagnosis not present

## 2018-12-29 DIAGNOSIS — I10 Essential (primary) hypertension: Secondary | ICD-10-CM | POA: Diagnosis not present

## 2018-12-29 DIAGNOSIS — R531 Weakness: Secondary | ICD-10-CM | POA: Diagnosis not present

## 2018-12-29 DIAGNOSIS — E1122 Type 2 diabetes mellitus with diabetic chronic kidney disease: Secondary | ICD-10-CM | POA: Diagnosis not present

## 2018-12-29 DIAGNOSIS — M4807 Spinal stenosis, lumbosacral region: Secondary | ICD-10-CM | POA: Diagnosis not present

## 2018-12-29 DIAGNOSIS — A419 Sepsis, unspecified organism: Secondary | ICD-10-CM | POA: Diagnosis not present

## 2018-12-29 DIAGNOSIS — E1159 Type 2 diabetes mellitus with other circulatory complications: Secondary | ICD-10-CM | POA: Diagnosis not present

## 2018-12-29 DIAGNOSIS — I2510 Atherosclerotic heart disease of native coronary artery without angina pectoris: Secondary | ICD-10-CM | POA: Diagnosis not present

## 2018-12-29 MED ORDER — HYDROCODONE-ACETAMINOPHEN 5-325 MG PO TABS: 1.0000 | ORAL_TABLET | Freq: Four times a day (QID) | ORAL | 0 refills | Status: DC | PRN

## 2019-01-01 ENCOUNTER — Non-Acute Institutional Stay (SKILLED_NURSING_FACILITY): Payer: Medicare Other | Admitting: Internal Medicine

## 2019-01-01 ENCOUNTER — Other Ambulatory Visit: Payer: Self-pay | Admitting: *Deleted

## 2019-01-01 ENCOUNTER — Encounter: Payer: Self-pay | Admitting: Internal Medicine

## 2019-01-01 DIAGNOSIS — G894 Chronic pain syndrome: Secondary | ICD-10-CM | POA: Diagnosis not present

## 2019-01-01 DIAGNOSIS — Z79891 Long term (current) use of opiate analgesic: Secondary | ICD-10-CM

## 2019-01-01 DIAGNOSIS — E039 Hypothyroidism, unspecified: Secondary | ICD-10-CM | POA: Diagnosis not present

## 2019-01-01 DIAGNOSIS — M48061 Spinal stenosis, lumbar region without neurogenic claudication: Secondary | ICD-10-CM

## 2019-01-01 DIAGNOSIS — N3001 Acute cystitis with hematuria: Secondary | ICD-10-CM

## 2019-01-01 DIAGNOSIS — E1159 Type 2 diabetes mellitus with other circulatory complications: Secondary | ICD-10-CM | POA: Diagnosis not present

## 2019-01-01 DIAGNOSIS — E785 Hyperlipidemia, unspecified: Secondary | ICD-10-CM | POA: Diagnosis not present

## 2019-01-01 DIAGNOSIS — I10 Essential (primary) hypertension: Secondary | ICD-10-CM | POA: Diagnosis not present

## 2019-01-01 DIAGNOSIS — E1169 Type 2 diabetes mellitus with other specified complication: Secondary | ICD-10-CM

## 2019-01-01 DIAGNOSIS — I25118 Atherosclerotic heart disease of native coronary artery with other forms of angina pectoris: Secondary | ICD-10-CM

## 2019-01-01 DIAGNOSIS — Z794 Long term (current) use of insulin: Secondary | ICD-10-CM

## 2019-01-01 NOTE — Progress Notes (Signed)
: Provider:  Noah Delaine. Sheppard Coil MD Location:  [facility] Room Number: 503/P Place of Service:  SNF [phone])  PCP: Martinique, Betty G, MD Patient Care Team: Martinique, Betty G, MD as PCP - General (Family Medicine) [name], MD as PCP - Cardiology (Cardiology) Ardis Hughs, MD as Attending Physician (Urology)  Extended Emergency Contact Information Primary Emergency Contact: Luna Kitchens Address: [address]          [place], [place] [address] [place] Phone: [phone] Mobile Phone: [phone] Relation: Daughter     Allergies: Patient has no known allergies.  Chief Complaint  Patient presents with   New Admit To SNF    New Admission Visit    HPI: Patient is 78 y.o. female with CAD status post CABG, hypertension, diabetes mellitus type 2, hyperlipidemia, CKD stage III, recurrent UTIs, bilateral hydronephrosis, right ureteral stricture, diverticulosis, hypothyroidism, lumbosacral radiculopathy, right lung nodule, chronic pain syndrome, who presented to [facility] emergency department with worsening left gluteal pain for 8 weeks worse than the prior 3 weeks.  No trauma or injury to the area.  In the ED the patient was afebrile.  Right count was improved from 14.7-13.2.  Lactic acid mildly elevated at 1.92.  Patient was on chronic suppressive therapy for UTIs with Keflex.  UA revealed 42 WBCs 153 RBCs moderate leukocyte gastritis and negative for nitrite.  CT abdomen pelvis revealed moderate right hydronephrosis with right ureteral stent and severe left hydronephrosis.  She is followed up by her urology monthly getting replacement of the ureteral stent.  Patient was admitted to [facility] from 10/19-23 where she was treated with Rocephin IV.  And was discharged home on Cipro for 7 days.  MRI of the spine revealed progressively severe spinal and bilateral recess stenosis she was evaluated by spine surgery who  recommended symptomatic treatment with steroids and analgesics.  Patient is admitted to skilled nursing facility for OT/PT while at skilled nursing facility patient will be followed for CAD treated with ASA, Plavix, Imdur, statin, hyperlipidemia treated with Lipitor and hypothyroidism treated with replacement.  Past Medical History:  Diagnosis Date   A-fib [facility])    AAA (abdominal aortic aneurysm) ([place])    Bilateral hydronephrosis    Carotid artery stenosis    carotid doppler 06/2012 - Right CCA/Bulb/ICA with chronic occlusion; L vertebral artery with abnormal blood flow; L Bulb/Prox ICA  s/p endarterectomy with mild fibrous plaque, 50% diameter reduction   CHF (congestive heart failure) ([place])    Choledocholithiasis 2017   CKD (chronic kidney disease), stage III    Coronary artery disease due to lipid rich plaque cardiologist-  dr berry   s/p CABG x6 1997-- cath 12-09-2009 occluded vein to obtuse marginal branch and ramus branch with patent vien to PDA and patent LIMA to LAD, ef 40%-- Myoview 11-24-2011, nonischemic   Diverticulosis    Dyspnea on exertion    GERD (gastroesophageal reflux disease)    GI bleed    History of sepsis    10-18-2014 w/ acute pyelonephritis   Hyperlipidemia    Hypertension    Hypothyroidism    Iron deficiency anemia    Ischemic cardiomyopathy    03-25-2010-- per lasts echo EF  50-55%   Ischemic cardiomyopathy    PAD (peripheral artery disease) ([place])    09/2010 LEAs - R ABI of 0.45, occluded fem-pop bypass graft, L ABI of 0.59 with occluded SFA; severe arterial  insuff   PONV (postoperative nausea and vomiting)    PVD (peripheral vascular disease) with claudication ([place])    last duplex 07-04-2015 -- Right CCA and ICA chronic occlusion, 75-91% LICA, Patent vertebral arteries w/ antegrade flow, bilateral normal subclavian arteries    Retroperitoneal fibrosis    Tubular adenoma of colon    Type 2 diabetes mellitus ([place])    monitored by dr  Dwyane Dee    Past Surgical History:  Procedure Laterality Date   AORTA - BILATERAL FEMORAL ARTERY BYPASS GRAFT  1997   and RIGHT FEM-POP    CARDIAC CATHETERIZATION  12-09-2009  dr al little   EF >40%-- occluded vein to OM & ramus branches, patent vein to PDA, patent LIMA to LAD (Dr. [name], [facility]) - later had thrombectomy of R fem-pop bypass ad R common femoral & profunda femoris artery (Dr. Oneida Alar)   CARDIOVASCULAR STRESS TEST  11-24-2011   dr berry   Low Risk study: fixed basal to mid inferior attenuation artifact, no reversible ischemia,  normal LV function and wall motion , ef 67%   CAROTID ENDARTERECTOMY Bilateral right 1994//  left ?   CATARACT EXTRACTION W/ INTRAOCULAR LENS  IMPLANT, BILATERAL  2006   CHOLECYSTECTOMY N/A 09/05/2015   Procedure: ATTEMPTED LAPAROSCOPIC CHOLECYSTECTOMY, EXPLORATORY LAPAROTOMY WITH CHOLECYSTECTOMY;  Surgeon: Autumn Messing III, MD;  Location: [facility];  Service: General;  Laterality: N/A;   COLONOSCOPY     COLONOSCOPY  10/21/2015   COLONOSCOPY WITH PROPOFOL N/A 10/21/2015   Procedure: COLONOSCOPY WITH PROPOFOL;  Surgeon: Milus Banister, MD;  Location: [facility];  Service: Endoscopy;  Laterality: N/A;   CORONARY ARTERY BYPASS GRAFT  1997   x6; internal mammary to LAD, SVG to ramus #1 & #2, SVG to OM, SVG to PDA,    CYSTOSCOPY W/ RETROGRADES Right 08/01/2015   Procedure: CYSTOSCOPY WITH RETROGRADE PYELOGRAM;  Surgeon: Ardis Hughs, MD;  Location: [facility];  Service: Urology;  Laterality: Right;   CYSTOSCOPY W/ URETERAL STENT PLACEMENT  03/10/2012   Procedure: CYSTOSCOPY WITH RETROGRADE PYELOGRAM/URETERAL STENT PLACEMENT;  Surgeon: Hanley Ben, MD;  [facility];  Service: Urology;  Laterality: Left;   CYSTOSCOPY W/ URETERAL STENT PLACEMENT Bilateral 03/07/2015   Procedure: BILATERAL RETROGRADE PYELOGRAM AND RIGHT URETERAL STENT PLACEMENT;  Surgeon: Ardis Hughs, MD;  Location: [facility];  Service:  Urology;  Laterality: Bilateral;   CYSTOSCOPY W/ URETERAL STENT PLACEMENT Right 08/01/2015   Procedure: CYSTOSCOPY WITH STENT REPLACEMENT;  Surgeon: Ardis Hughs, MD;  Location: [facility];  Service: Urology;  Laterality: Right;   CYSTOSCOPY W/ URETERAL STENT PLACEMENT Right 02/13/2016   Procedure: RIGHT URETERAL STENT EXCHANGE;  Surgeon: Ardis Hughs, MD;  [facility];  Service: Urology;  Laterality: Right;   CYSTOSCOPY W/ URETERAL STENT PLACEMENT Right 09/17/2016   Procedure: CYSTOSCOPY WITH RIGHT  RETROGRADE PYELOGRAM RIGHT URETERAL STENT EXCHANGE;  Surgeon: Ardis Hughs, MD;  [facility];  Service: Urology;  Laterality: Right;   CYSTOSCOPY W/ URETERAL STENT PLACEMENT Bilateral 03/18/2017   Procedure: CYSTOSCOPY WITH BILATERAL  RETROGRADE PYELOGRAM RIGHT URETERAL STENT [place];  Surgeon: Ardis Hughs, MD;  [facility];  Service: Urology;  Laterality: Bilateral;   CYSTOSCOPY W/ URETERAL STENT PLACEMENT Right 05/26/2018   Procedure: CYSTOSCOPY WITH RIGHT RETROGRADE PYELOGRAM RIGHT URETERAL STENT EXCHANGE;  Surgeon: Ardis Hughs, MD;  [facility];  Service: Urology;  Laterality: Right;   CYSTOSCOPY WITH STENT PLACEMENT Right 10/06/2017   Procedure: CYSTOSCOPY, RETROGRADE  WITH RIGHT STENT EXCHANGE;  Surgeon: Ardis Hughs, MD;  [facility];  Service: Urology;  Laterality: Right;   CYSTOSCOPY WITH STENT PLACEMENT Right 12/01/2018   Procedure: CYSTOSCOPY WITH STENT PLACEMENT;  Surgeon: Ardis Hughs, MD;  [facility];  Service: Urology;  Laterality: Right;   ERCP N/A 09/03/2015   Procedure: ENDOSCOPIC RETROGRADE CHOLANGIOPANCREATOGRAPHY (ERCP);  Surgeon: Irene Shipper, MD;  Location[facility];  Service: Endoscopy;  Laterality: N/A;   ESOPHAGOGASTRODUODENOSCOPY     ESOPHAGOGASTRODUODENOSCOPY N/A 10/19/2015   Procedure: ESOPHAGOGASTRODUODENOSCOPY (EGD);  Surgeon: Gatha Mayer, MD;  Location: [facility]  ENDOSCOPY;  Service: Endoscopy;  Laterality: N/A;   GIVENS CAPSULE STUDY  10/21/2015   GIVENS CAPSULE STUDY N/A 10/21/2015   Procedure: GIVENS CAPSULE STUDY;  Surgeon: Milus Banister, MD;  Location: [place];  Service: Endoscopy;  Laterality: N/A;   KNEE SURGERY Right 03-01-2018-03-04-2018   PLATEAU FRACTURE HOSPITLIZATION    REPAIR RIGHT FEMORAL PSEUDOANEUYSM/  RIGHT FEM-POP BYPASS GRAFT/  DEBRIDEMENT RIGHT LOWER EXTREMITIY VENOUS STATUS ULCERS X2  01-12-2005   TOTAL ABDOMINAL HYSTERECTOMY W/ BILATERAL SALPINGOOPHORECTOMY  1986   TRANSTHORACIC ECHOCARDIOGRAM  03/25/2010   EF 34-91%, LV systolic function low normal with mild inferoseptal hypocontractility; LA mildly dilated; mod MR; mild TR, RV systolic pressure elevated, mild pulm HTN; AV mildly sclerotic; mild pulm valve regurg; aortic root sclerosis/calcif     Allergies as of 01/01/2019   No Known Allergies     Medication List       Accurate as of January 01, 2019 10:02 AM. If you have any questions, ask your nurse or doctor.        STOP taking these medications   cephALEXin 500 MG capsule Commonly known as: KEFLEX Stopped by: Inocencio Homes, MD   fenofibrate 54 MG tablet Stopped by: Inocencio Homes, MD   Imodium A-D 2 MG tablet Generic drug: loperamide Stopped by: Inocencio Homes, MD   omeprazole 20 MG capsule Commonly known as: PRILOSEC Stopped by: Inocencio Homes, MD   oxyCODONE-acetaminophen 5-325 MG tablet Commonly known as: PERCOCET/ROXICET Stopped by: Inocencio Homes, MD   phenazopyridine 200 MG tablet Commonly known as: Pyridium Stopped by: Inocencio Homes, MD     TAKE these medications   acetaminophen 650 MG CR tablet Commonly known as: TYLENOL Take 1,300 mg by mouth every 8 (eight) hours as needed for pain.   albuterol 108 (90 Base) MCG/ACT inhaler Commonly known as: VENTOLIN HFA INHALE 2 PUFFS INTO THE LUNGS EVERY 6 HOURS AS NEEDED FOR WHEEZING OR SHORTNESS OF BREATH   aspirin 81 MG chewable  tablet Chew 81 mg by mouth daily.   atorvastatin 80 MG tablet Commonly known as: LIPITOR Take 1 tablet (80 mg total) by mouth daily.   calcium-vitamin D 500-200 MG-UNIT tablet Commonly known as: OSCAL WITH D Take 1 tablet by mouth daily. In the morning   ciprofloxacin 500 MG tablet Commonly known as: CIPRO Take 500 mg by mouth 2 (two) times daily.   clopidogrel 75 MG tablet Commonly known as: PLAVIX Take 75 mg by mouth daily. *ANTIPLATELET *   docusate sodium 100 MG capsule Commonly known as: COLACE Take 100 mg by mouth 2 (two) times daily.   ergocalciferol 1.25 MG (50000 UT) capsule Commonly known as: VITAMIN D2 Take 50,000 Units by mouth. Every 14 days What changed: Another medication with the same name was removed. Continue taking this medication, and follow the directions you see here. Changed by: Inocencio Homes, MD   ferrous sulfate 325 (65  FE) MG tablet Take 650 mg by mouth every evening.   furosemide 20 MG tablet Commonly known as: LASIX TAKE 1 TABLET(20 MG) BY MOUTH DAILY   glucose blood test strip Commonly known as: ONE TOUCH ULTRA TEST USE TO TEST BLOOD SUGAR 1 TO 2 TIMES DAILY AS DIRECTED   HYDROcodone-acetaminophen 5-325 MG tablet Commonly known as: NORCO/VICODIN Take 1 tablet by mouth every 6 (six) hours as needed for moderate pain or severe pain.   Insulin Pen Needle 32G X 4 MM Misc Use 4 times daily   isosorbide mononitrate 30 MG 24 hr tablet Commonly known as: IMDUR TAKE 1 TABLET BY MOUTH EVERY DAY   latanoprost 0.005 % ophthalmic solution Commonly known as: XALATAN Place 1 drop into both eyes at bedtime.   Levemir 100 UNIT/ML injection Generic drug: insulin detemir Inject 12 Units into the skin at bedtime. Inject 12 units under the skin at bedtime.   levothyroxine 100 MCG tablet Commonly known as: SYNTHROID Take 1 tablet (100 mcg total) by mouth daily.   lidocaine 5 % Commonly known as: LIDODERM Place 1 patch onto the skin daily.  Remove & Discard patch within 12 hours or as directed by MD   NovoLOG FlexPen 100 UNIT/ML FlexPen Generic drug: insulin aspart Inject 6 Units into the skin 3 (three) times daily with meals. What changed: Another medication with the same name was removed. Continue taking this medication, and follow the directions you see here. Changed by: Inocencio Homes, MD   tamsulosin 0.4 MG Caps capsule Commonly known as: FLOMAX Take 0.4 mg by mouth daily.   trolamine salicylate 10 % cream Commonly known as: ASPERCREME Apply 1 application topically 4 (four) times daily as needed (arthritis pain.).   vitamin B-12 1000 MCG tablet Commonly known as: CYANOCOBALAMIN Take 1,000 mcg by mouth every evening.   vitamin C 500 MG tablet Commonly known as: ASCORBIC ACID Take 500 mg by mouth at bedtime.       No orders of the defined types were placed in this encounter.   Immunization History  Administered Date(s) Administered   Influenza, High Dose Seasonal PF 12/02/2015, 01/16/2018   Influenza,inj,Quad PF,6+ Mos 02/23/2013, 01/28/2014, 01/02/2015, 02/22/2017   Influenza-Unspecified 12/07/2018   Pneumococcal Conjugate-13 07/11/2013    Social History   Tobacco Use   Smoking status: Former Smoker    Packs/day: 2.00    Years: 30.00    Pack years: 60.00    Quit date: 06/07/2009    Years since quitting: 9.5   Smokeless tobacco: Never Used  Substance Use Topics   Alcohol use: Yes    Alcohol/week: 3.0 standard drinks    Types: 3 Standard drinks or equivalent per week    Comment: ocassional    Family history is   Family History  Problem Relation Age of Onset   Congestive Heart Failure Mother    Diabetes Mother    Stroke Father    Cancer Maternal Aunt        Breast cancer      Review of Systems  DATA OBTAINED: from patient, nurse GENERAL:  no fevers, fatigue, appetite changes SKIN: No itching, or rash EYES: No eye pain, redness, discharge EARS: No earache, tinnitus,  change in hearing NOSE: No congestion, drainage or bleeding  MOUTH/THROAT: No mouth or tooth pain, No sore throat RESPIRATORY: No cough, wheezing, SOB CARDIAC: No chest pain, palpitations, lower extremity edema  GI: No abdominal pain, No N/V/D or constipation, No heartburn or reflux  GU: No dysuria, frequency  or urgency, or incontinence  MUSCULOSKELETAL: Increasing low back pain NEUROLOGIC: No headache, dizziness or focal weakness PSYCHIATRIC: No c/o anxiety or sadness   Vitals:   01/01/19 0911  BP: 123/77  Pulse: 77  Resp: 19  Temp: 97.9 F (36.6 C)    SpO2 Readings from Last 1 Encounters:  12/12/18 99%   Body mass index is 20.26 kg/m.     Physical Exam  GENERAL APPEARANCE: Alert, conversant,  No acute distress.  SKIN: No diaphoresis rash HEAD: Normocephalic, atraumatic  EYES: Conjunctiva/lids clear. Pupils round, reactive. EOMs intact.  EARS: External exam WNL, canals clear. Hearing grossly normal.  NOSE: No deformity or discharge.  MOUTH/THROAT: Lips w/o lesions  RESPIRATORY: Breathing is even, unlabored. Lung sounds are clear   CARDIOVASCULAR: Heart RRR no murmurs, rubs or gallops. No peripheral edema.   GASTROINTESTINAL: Abdomen is soft, non-tender, not distended w/ normal bowel sounds. GENITOURINARY: Bladder non tender, not distended  MUSCULOSKELETAL: No abnormal joints or musculature NEUROLOGIC:  Cranial nerves 2-12 grossly intact. Moves all extremities  PSYCHIATRIC: Mood and affect appropriate to situation, no behavioral issues  Patient Active Problem List   Diagnosis Date Noted   Chronic prescription opiate use 10/20/2018   Chronic diarrhea 07/17/2018   Primary osteoarthritis of right knee 03/01/2018   Closed fracture of lateral portion of right tibial plateau 03/01/2018   Pressure injury of skin 01/25/2017   UTI (urinary tract infection) 01/25/2017   AKI (acute kidney injury) ([place]) 01/24/2017   Fall 03/24/2016   Atrial flutter ([place]) 01/26/2016    Acute kidney injury superimposed on chronic kidney disease ([place]) 01/26/2016   PAD (peripheral artery disease) ([place]) 01/26/2016   Acute on chronic combined systolic and diastolic CHF  46/50/3546   Melena    Acute blood loss anemia    GI bleed 10/18/2015   Preop cardiovascular exam 09/04/2015   Choledocholithiasis    Pressure ulcer 08/29/2015   Protein-calorie malnutrition, severe 08/29/2015   Lung nodule    Acute pyelonephritis 10/16/2014   Hyperlipidemia 01/08/2014   Type II or unspecified type diabetes mellitus with renal manifestations, uncontrolled(250.42) 01/10/2013   Carotid artery disease ([place]) 01/03/2013   DOE (dyspnea on exertion) 11/20/2012   Claudication in peripheral vascular disease ([place]) 11/20/2012   Bradycardia 11/20/2012   Chronic kidney disease, stage III (moderate) ([place]) 11/08/2012   Hyperlipidemia associated with type 2 diabetes mellitus ([place]) 10/01/2012   Hypothyroidism 10/01/2012   Chronic combined systolic and diastolic congestive heart failure ([place]) 06/08/2012   Abdominal pain 03/09/2012   Altered mental status 03/09/2012   Diabetes mellitus, type II ([place]) 03/09/2012   Generalized osteoarthritis of multiple sites 03/09/2012   HTN (hypertension) 03/09/2012   Acute encephalopathy 03/09/2012   Normocytic anemia 03/09/2012   Elevated LFTs 03/09/2012   AAA (abdominal aortic aneurysm) ([place]) 03/09/2012   Lower urinary tract infectious disease 03/08/2012   Hx of CABG '97 03/08/2012      Labs reviewed: Basic Metabolic Panel:    Component Value Date/Time   NA 138 12/25/2018   K 5.2 12/25/2018   CL 107 12/01/2018 0745   CO2 19 (L) 12/01/2018 0745   GLUCOSE 100 (H) 12/01/2018 0745   BUN 36 (A) 12/25/2018   CREATININE 1.6 (A) 12/25/2018   CREATININE 1.50 (H) 12/01/2018 0745   CREATININE 1.38 (H) 03/10/2016 1218   CALCIUM 8.0 (L) 12/01/2018 0745   PROT 6.1 (L) 12/01/2018 0745   ALBUMIN 2.9 (L) 12/01/2018 0745   AST 14  12/25/2018   ALT 13 12/25/2018  ALKPHOS 104 12/25/2018   BILITOT 0.7 12/01/2018 0745   GFRNONAA 33 (L) 12/01/2018 0745   GFRAA 39 (L) 12/01/2018 0745    Recent Labs    11/09/18 0824 11/22/18 0941 12/01/18 0745 12/25/18  NA 137 139 138 138  K 4.6 5.6* 4.7 5.2  CL 106 108 107  --   CO2 22 24 19*  --   GLUCOSE 134* 95 100*  --   BUN 64* 44* 27* 36*  CREATININE 2.54* 1.68* 1.50* 1.6*  CALCIUM 10.0 9.6 8.0*  --    Liver Function Tests: Recent Labs    09/04/18 0833 11/09/18 0824 12/01/18 0745 12/25/18  AST 15 17 24 14   ALT 12 11 11 13   ALKPHOS 66 54 55 104  BILITOT 0.3 0.3 0.7  --   PROT 6.9 6.5 6.1*  --   ALBUMIN 3.7 3.3* 2.9*  --    No results for input(s): LIPASE, AMYLASE in the last 8760 hours. No results for input(s): AMMONIA in the last 8760 hours. CBC: Recent Labs    05/19/18 1427 11/22/18 0941 12/25/18 12/28/18  WBC 8.6 11.2* 14.7 13.2  HGB 11.6* 11.0* 9.7* 8.3*  HCT 38.1 36.7 30* 26*  MCV 99.2 101.7*  --   --   PLT 217 317 401* 344   Lipid Recent Labs    09/04/18 0833 11/09/18 0824  CHOL 140 154  HDL 32.70* 33.50*  TRIG 309.0* 269.0*    Cardiac Enzymes: No results for input(s): CKTOTAL, CKMB, CKMBINDEX, TROPONINI in the last 8760 hours. BNP: No results for input(s): BNP in the last 8760 hours. Lab Results  Component Value Date   MICROALBUR 10.4 (H) 09/04/2018   Lab Results  Component Value Date   HGBA1C 5.6 11/22/2018   Lab Results  Component Value Date   TSH 6.73 (H) 11/09/2018   Lab Results  Component Value Date   VITAMINB12 1,601 (H) 06/09/2012   Lab Results  Component Value Date   FOLATE  11/15/2009    4.3 (NOTE)  Reference Ranges        Deficient:       0.4 - 3.3 ng/mL        Indeterminate:   3.4 - 5.4 ng/mL        Normal:              > 5.4 ng/mL   Lab Results  Component Value Date   IRON 46 10/04/2017   TIBC 199 (L) 11/15/2009   FERRITIN 183.1 10/06/2016    Imaging and Procedures obtained prior to SNF admission: No  results found.   Not all labs, radiology exams or other studies done during hospitalization come through on my EPIC note; however they are reviewed by me.    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  Urine cultures grew out less than 100,000 colonies CFU's but patient was treated with IV Rocephin  SNF-discharged with ciprofloxacin 500 mg twice daily for 7 days after which patient will restart her chronic suppressive therapy with Keflex  500 mg nightly in addition Flomax 0.4 mg daily  Coronary artery disease SNF-no complaints of chest pain; continue ASA 81 mg daily Plavix 75 mg daily, and Imdur ER 30 mg daily; patient is on statin  Hyperlipidemia SNF-not stated as uncontrolled; continue Lipitor 80 mg daily  Hypothyroidism SNF-not stated as uncontrolled; continue Synthroid 100 mcg daily  Hypertension SNF-controlled; continue Lasix 40 mg daily  Diabetes mellitus SNF-continue NovoLog 6 units 3 times daily with meals, and Levemir 12 units nightly; patient is on statin   Time spent greater than 45 minutes;> 50% of time with patient was spent reviewing records, labs, tests and studies, counseling and developing plan of care Hennie Duos, MD

## 2019-01-01 NOTE — Patient Outreach (Signed)
Member assessed for potential [facility] Care Management needs as a benefit of  [place] [employer].  Member is currently receiving rehab therapy at [facility].  Notification sent to facility dc planner to make aware that writer is following for disposition plans, progression, and for potential [facility] Care Management needs.  Will continue to follow and collaborate.   Marthenia Rolling, MSN-Ed, RN,BSN [place] Acute Care Coordinator [phone] [facility]) [phone]  (Toll free office)

## 2019-01-02 ENCOUNTER — Ambulatory Visit: Payer: Medicare Other | Admitting: Endocrinology

## 2019-01-02 ENCOUNTER — Encounter: Payer: Self-pay | Admitting: Internal Medicine

## 2019-01-02 DIAGNOSIS — M48061 Spinal stenosis, lumbar region without neurogenic claudication: Secondary | ICD-10-CM | POA: Insufficient documentation

## 2019-01-02 DIAGNOSIS — G894 Chronic pain syndrome: Secondary | ICD-10-CM | POA: Insufficient documentation

## 2019-01-02 DIAGNOSIS — I2510 Atherosclerotic heart disease of native coronary artery without angina pectoris: Secondary | ICD-10-CM | POA: Insufficient documentation

## 2019-01-03 ENCOUNTER — Other Ambulatory Visit: Payer: Self-pay | Admitting: *Deleted

## 2019-01-03 ENCOUNTER — Non-Acute Institutional Stay (SKILLED_NURSING_FACILITY): Payer: Medicare Other | Admitting: Internal Medicine

## 2019-01-03 DIAGNOSIS — D72829 Elevated white blood cell count, unspecified: Secondary | ICD-10-CM

## 2019-01-03 DIAGNOSIS — N183 Chronic kidney disease, stage 3 unspecified: Secondary | ICD-10-CM

## 2019-01-03 DIAGNOSIS — D509 Iron deficiency anemia, unspecified: Secondary | ICD-10-CM

## 2019-01-03 DIAGNOSIS — N179 Acute kidney failure, unspecified: Secondary | ICD-10-CM

## 2019-01-03 DIAGNOSIS — M48061 Spinal stenosis, lumbar region without neurogenic claudication: Secondary | ICD-10-CM | POA: Diagnosis not present

## 2019-01-03 LAB — BASIC METABOLIC PANEL
BUN: 28 — AB (ref 4–21)
BUN: 32 — AB (ref 4–21)
Creatinine: 1.5 — AB (ref 0.5–1.1)
Creatinine: 1.8 — AB (ref 0.5–1.1)
Glucose: 101
Glucose: 128
Potassium: 5.1 (ref 3.4–5.3)
Potassium: 6.1 — AB (ref 3.4–5.3)
Sodium: 138 (ref 137–147)
Sodium: 138 (ref 137–147)

## 2019-01-03 LAB — CBC AND DIFFERENTIAL
HCT: 28 — AB (ref 36–46)
HCT: 29 — AB (ref 36–46)
Hemoglobin: 9.1 — AB (ref 12.0–16.0)
Hemoglobin: 9.2 — AB (ref 12.0–16.0)
Neutrophils Absolute: 14
Neutrophils Absolute: 16
Platelets: 262 (ref 150–399)
Platelets: 471 — AB (ref 150–399)
WBC: 16.1
WBC: 18.3

## 2019-01-03 NOTE — Progress Notes (Signed)
This is an acute visit.  Level of care skilled.  [facility]ility [facility].  Chief complaint acute visit secondary to elevated white count-.  History of present illness.  Patient is a pleasant 78 year old female with a history of coronary artery disease as well as hypertension type 2 diabetes hyperlipidemia chronic kidney disease stage III recurrent UTIs and bilateral hydronephrosis with the right ureter ureteral stricture as well as diverticulosis hypothyroidism lumbosacral radiculopathy and right lung nodule as well as chronic pain.  She came to the hospital complaining of left gluteal pain.  UA revealed possible UTI--cultures did grow out less than 100,000 colonies CFU's-- and she was treated with Rocephin.  And is completing a course of Cipro p.o. subsequently she will start Keflex prophylactically which she had been on previously.  She does have a history of severe spinal stenosis at L4-5 with disc protrusion likely affecting the right L4 nerve root-she was treated with steroids and analgesics in the hospital she is on Tylenol here as well as Norco 5-325 mg every 6 hours as needed as well as Aspercreme and Lidoderm patch.  Routine follow-up labs show a white count is elevated at 16,100 it appeared in the hospital was running in the 13 14,000 range-she is not complaining of any fever chills shortness of breath or chest congestion or dysuria at this time   Her only complaint is pain at times and again Dr. Sheppard Coil has been following this and this is somewhat challenging because of sedation concerns with more aggressive pain management   Past Medical History:  Diagnosis Date  . A-fib ([place])   . AAA (abdominal aortic aneurysm) ([place])   . Bilateral hydronephrosis   . Carotid artery stenosis    carotid doppler 06/2012 - Right CCA/Bulb/ICA with chronic occlusion; L vertebral artery with abnormal blood flow; L Bulb/Prox ICA  s/p endarterectomy with mild fibrous plaque, 50% diameter  reduction  . CHF (congestive heart failure) ([place])   . Choledocholithiasis 2017  . CKD (chronic kidney disease), stage III   . Coronary artery disease due to lipid rich plaque cardiologist-  dr berry   s/p CABG x6 1997-- cath 12-09-2009 occluded vein to obtuse marginal branch and ramus branch with patent vien to PDA and patent LIMA to LAD, ef 40%-- Myoview 11-24-2011, nonischemic  . Diverticulosis   . Dyspnea on exertion   . GERD (gastroesophageal reflux disease)   . GI bleed   . History of sepsis    10-18-2014 w/ acute pyelonephritis  . Hyperlipidemia   . Hypertension   . Hypothyroidism   . Iron deficiency anemia   . Ischemic cardiomyopathy    03-25-2010-- per lasts echo EF  50-55%  . Ischemic cardiomyopathy   . PAD (peripheral artery disease) ([place])    09/2010 LEAs - R ABI of 0.45, occluded fem-pop bypass graft, L ABI of 0.59 with occluded SFA; severe arterial insuff  . PONV (postoperative nausea and vomiting)   . PVD (peripheral vascular disease) with claudication ([place])    last duplex 07-04-2015 -- Right CCA and ICA chronic occlusion, 97-35% LICA, Patent vertebral arteries w/ antegrade flow, bilateral normal subclavian arteries   . Retroperitoneal fibrosis   . Tubular adenoma of colon   . Type 2 diabetes mellitus ([place])    monitored by dr Dwyane Dee         Past Surgical History:  Procedure Laterality Date  . AORTA - BILATERAL FEMORAL ARTERY BYPASS GRAFT  1997   and RIGHT FEM-POP   . CARDIAC  CATHETERIZATION  12-09-2009  dr al little   EF >40%-- occluded vein to OM & ramus branches, patent vein to PDA, patent LIMA to LAD (Dr. [name], [facility]) - later had thrombectomy of R fem-pop bypass ad R common femoral & profunda femoris artery (Dr. Oneida Alar)  . CARDIOVASCULAR STRESS TEST  11-24-2011   dr berry   Low Risk study: fixed basal to mid inferior attenuation artifact, no reversible ischemia,  normal LV function and wall motion , ef 67%  . CAROTID  ENDARTERECTOMY Bilateral right 1994//  left ?  Marland Kitchen CATARACT EXTRACTION W/ INTRAOCULAR LENS  IMPLANT, BILATERAL  2006  . CHOLECYSTECTOMY N/A 09/05/2015   Procedure: ATTEMPTED LAPAROSCOPIC CHOLECYSTECTOMY, EXPLORATORY LAPAROTOMY WITH CHOLECYSTECTOMY;  Surgeon: Autumn Messing III, MD;  Location: [facility];  Service: General;  Laterality: N/A;  . COLONOSCOPY    . COLONOSCOPY  10/21/2015  . COLONOSCOPY WITH PROPOFOL N/A 10/21/2015   Procedure: COLONOSCOPY WITH PROPOFOL;  Surgeon: Milus Banister, MD;  Location: [place];  Service: Endoscopy;  Laterality: N/A;  . CORONARY ARTERY BYPASS GRAFT  1997   x6; internal mammary to LAD, SVG to ramus #1 & #2, SVG to OM, SVG to PDA,   . CYSTOSCOPY W/ RETROGRADES Right 08/01/2015   Procedure: CYSTOSCOPY WITH RETROGRADE PYELOGRAM;  Surgeon: Ardis Hughs, MD;  Location: [facility];  Service: Urology;  Laterality: Right;  . CYSTOSCOPY W/ URETERAL STENT PLACEMENT  03/10/2012   Procedure: CYSTOSCOPY WITH RETROGRADE PYELOGRAM/URETERAL STENT PLACEMENT;  Surgeon: Hanley Ben, MD;  Location: [facility];  Service: Urology;  Laterality: Left;  . CYSTOSCOPY W/ URETERAL STENT PLACEMENT Bilateral 03/07/2015   Procedure: BILATERAL RETROGRADE PYELOGRAM AND RIGHT URETERAL STENT PLACEMENT;  Surgeon: Ardis Hughs, MD;  Location: [facility];  Service: Urology;  Laterality: Bilateral;  . CYSTOSCOPY W/ URETERAL STENT PLACEMENT Right 08/01/2015   Procedure: CYSTOSCOPY WITH STENT REPLACEMENT;  Surgeon: Ardis Hughs, MD;  Location: [facility];  Service: Urology;  Laterality: Right;  . CYSTOSCOPY W/ URETERAL STENT PLACEMENT Right 02/13/2016   Procedure: RIGHT URETERAL STENT EXCHANGE;  Surgeon: Ardis Hughs, MD;  Location: [facility];  Service: Urology;  Laterality: Right;  . CYSTOSCOPY W/ URETERAL STENT PLACEMENT Right 09/17/2016   Procedure: CYSTOSCOPY WITH RIGHT  RETROGRADE PYELOGRAM RIGHT URETERAL STENT EXCHANGE;   Surgeon: Ardis Hughs, MD;  Location: [facility];  Service: Urology;  Laterality: Right;  . CYSTOSCOPY W/ URETERAL STENT PLACEMENT Bilateral 03/18/2017   Procedure: CYSTOSCOPY WITH BILATERAL  RETROGRADE PYELOGRAM RIGHT URETERAL STENT [place];  Surgeon: Ardis Hughs, MD;  Location: [facility];  Service: Urology;  Laterality: Bilateral;  . CYSTOSCOPY W/ URETERAL STENT PLACEMENT Right 05/26/2018   Procedure: CYSTOSCOPY WITH RIGHT RETROGRADE PYELOGRAM RIGHT URETERAL STENT EXCHANGE;  Surgeon: Ardis Hughs, MD;  Location: [facility];  Service: Urology;  Laterality: Right;  . CYSTOSCOPY WITH STENT PLACEMENT Right 10/06/2017   Procedure: CYSTOSCOPY, RETROGRADE  WITH RIGHT STENT EXCHANGE;  Surgeon: Ardis Hughs, MD;  Location: [facility];  Service: Urology;  Laterality: Right;  . CYSTOSCOPY WITH STENT PLACEMENT Right 12/01/2018   Procedure: CYSTOSCOPY WITH STENT PLACEMENT;  Surgeon: Ardis Hughs, MD;  Location: [facility];  Service: Urology;  Laterality: Right;  . ERCP N/A 09/03/2015   Procedure: ENDOSCOPIC RETROGRADE CHOLANGIOPANCREATOGRAPHY (ERCP);  Surgeon: Irene Shipper, MD;  Location: [facility];  Service: Endoscopy;  Laterality: N/A;  . ESOPHAGOGASTRODUODENOSCOPY    . ESOPHAGOGASTRODUODENOSCOPY N/A 10/19/2015   Procedure: ESOPHAGOGASTRODUODENOSCOPY (EGD);  Surgeon: Gatha Mayer,  MD;  Location: [facility];  Service: Endoscopy;  Laterality: N/A;  . GIVENS CAPSULE STUDY  10/21/2015  . GIVENS CAPSULE STUDY N/A 10/21/2015   Procedure: GIVENS CAPSULE STUDY;  Surgeon: Milus Banister, MD;  Location: [place];  Service: Endoscopy;  Laterality: N/A;  . KNEE SURGERY Right 03-01-2018-03-04-2018   PLATEAU FRACTURE HOSPITLIZATION   . REPAIR RIGHT FEMORAL PSEUDOANEUYSM/  RIGHT FEM-POP BYPASS GRAFT/  DEBRIDEMENT RIGHT LOWER EXTREMITIY VENOUS STATUS ULCERS X2  01-12-2005  . TOTAL ABDOMINAL HYSTERECTOMY W/ BILATERAL SALPINGOOPHORECTOMY  1986  . TRANSTHORACIC ECHOCARDIOGRAM   03/25/2010   EF 67-61%, LV systolic function low normal with mild inferoseptal hypocontractility; LA mildly dilated; mod MR; mild TR, RV systolic pressure elevated, mild pulm HTN; AV mildly sclerotic; mild pulm valve regurg; aortic root sclerosis/calcif     Allergies as of 01/01/2019   No Known Allergies        Medication List                             MEDICATIONS    acetaminophen 650 MG CR tablet Commonly known as: TYLENOL Take 650 mg by mouth every 8 (eight) hours as needed for pain.   albuterol 108 (90 Base) MCG/ACT inhaler Commonly known as: VENTOLIN HFA INHALE 2 PUFFS INTO THE LUNGS EVERY 6 HOURS AS NEEDED FOR WHEEZING OR SHORTNESS OF BREATH   aspirin 81 MG chewable tablet Chew 81 mg by mouth daily.   atorvastatin 80 MG tablet Commonly known as: LIPITOR Take 1 tablet (80 mg total) by mouth daily.   calcium-vitamin D 500-200 MG-UNIT tablet Commonly known as: OSCAL WITH D Take 1 tablet by mouth daily. In the morning   ciprofloxacin 500 MG tablet Commonly known as: CIPRO Take 500 mg by mouth 2 (two) times daily.   clopidogrel 75 MG tablet Commonly known as: PLAVIX Take 75 mg by mouth daily. *ANTIPLATELET *   docusate sodium 100 MG capsule Commonly known as: COLACE Take 100 mg by mouth 2 (two) times daily.   ergocalciferol 1.25 MG (50000 UT) capsule Commonly known as: VITAMIN D2 Take 50,000 Units by mouth. Every 14 days What changed: Another medication with the same name was removed. Continue taking this medication, and follow the directions you see here. Changed by: Inocencio Homes, MD   ferrous sulfate 325 (65 FE) MG tablet Take 650 mg by mouth every evening.   furosemide 20 MG tablet Commonly known as: LASIX TAKE 1 TABLET(20 MG) BY MOUTH DAILY   glucose blood test strip Commonly known as: ONE TOUCH ULTRA TEST USE TO TEST BLOOD SUGAR 1 TO 2 TIMES DAILY AS DIRECTED   HYDROcodone-acetaminophen 5-325 MG tablet Commonly  known as: NORCO/VICODIN Take 1 tablet by mouth every 6 (six) hours as needed for moderate pain or severe pain.   Insulin Pen Needle 32G X 4 MM Misc Use 4 times daily   isosorbide mononitrate 30 MG 24 hr tablet Commonly known as: IMDUR TAKE 1 TABLET BY MOUTH EVERY DAY   latanoprost 0.005 % ophthalmic solution Commonly known as: XALATAN Place 1 drop into both eyes at bedtime.   Levemir 100 UNIT/ML injection Generic drug: insulin detemir Inject 12 Units into the skin at bedtime. Inject 12 units under the skin at bedtime.   levothyroxine 100 MCG tablet Commonly known as: SYNTHROID Take 1 tablet (100 mcg total) by mouth daily.   lidocaine 5 % Commonly known as: LIDODERM Place 1 patch onto the skin daily. Remove &  Discard patch within 12 hours or as directed by MD   NovoLOG FlexPen 100 UNIT/ML FlexPen Generic drug: insulin aspart Inject 6 Units into the skin 3 (three) times daily with meals. What changed: Another medication with the same name was removed. Continue taking this medication, and follow the directions you see here. Changed by: Inocencio Homes, MD   tamsulosin 0.4 MG Caps capsule Commonly known as: FLOMAX Take 0.4 mg by mouth daily.   trolamine salicylate 10 % cream Commonly known as: ASPERCREME Apply 1 application topically 4 (four) times daily as needed (arthritis pain.).   vitamin B-12 1000 MCG tablet Commonly known as: CYANOCOBALAMIN Take 1,000 mcg by mouth every evening.   vitamin C 500 MG tablet Commonly known as: ASCORBIC ACID Take 500 mg by mouth at bedtime.       No orders of the defined types were placed in this encounter.       Immunization History  Administered Date(s) Administered  . Influenza, High Dose Seasonal PF 12/02/2015, 01/16/2018  . Influenza,inj,Quad PF,6+ Mos 02/23/2013, 01/28/2014, 01/02/2015, 02/22/2017  . Influenza-Unspecified 12/07/2018  . Pneumococcal Conjugate-13 07/11/2013    Social History         Tobacco Use  . Smoking status: Former Smoker    Packs/day: 2.00    Years: 30.00    Pack years: 60.00    Quit date: 06/07/2009    Years since quitting: 9.5  . Smokeless tobacco: Never Used  Substance Use Topics  . Alcohol use: Yes    Alcohol/week: 3.0 standard drinks    Types: 3 Standard drinks or equivalent per week    Comment: ocassional    Family history is   Family History  Problem Relation Age of Onset  . Congestive Heart Failure Mother   . Diabetes Mother   . Stroke Father   . Cancer Maternal Aunt        Breast cancer      Review of Systems  General she is not complaining of any fever or chills.  Skin does not complain of rashes or itching.  Head ears eyes nose mouth and throat is not complain of visual changes or sore throat.  Respiratory denies any increased cough or shortness of breath.  Cardiac is not complaining of chest pain or edema.  GI does not complain of any diarrhea nausea vomiting diarrhea constipation or abdominal discomfort.  GU has an extensive history here but is not complaining of any dysuria he is finishing antibiotic for possible UTI.  Musculoskeletal does have a history of back pain with at times complaints of pain as noted above.  Neurologic is not complaining of dizziness headache or syncope.  And psych is not complaining of being depressed or anxious currently.  Physical exam.  Temperature is 97.7 pulse 70 respirations 17 blood pressure 129/67 oxygen saturation is 98% on room air.  In general this is a pleasant elderly female in no distress lying comfortably in bed.  Her skin is warm and dry.  Eyes visual acuity appears to be intact sclera and conjunctive are clear.  Oropharynx is clear mucous membranes moist.  Chest is clear to auscultation there is no labored breathing.    Heart is regular rate and rhythm without murmur gallop or rub she does not have significant lower extremity edema.    Abdomen is soft nontender with positive bowel sounds.  Musculoskeletal--limited exam since she is in bed but appears able to move all her extremities x4 at baseline.  Neurologic appears to be  grossly intact cranial nerves intact her speech is clear.  Psych she is alert and oriented pleasant and appropriate.  Labs.  Tober 28 2020.  WBC 16.1 hemoglobin 9.2 platelets 462.  Sodium 138 potassium 5.1 BUN 28.4 creatinine 1.49  Recent Labs    11/09/18 0824 11/22/18 0941 12/01/18 0745 12/25/18  NA 137 139 138 138  K 4.6 5.6* 4.7 5.2  CL 106 108 107  --   CO2 22 24 19*  --   GLUCOSE 134* 95 100*  --   BUN 64* 44* 27* 36*  CREATININE 2.54* 1.68* 1.50* 1.6*  CALCIUM 10.0 9.6 8.0*  --      Liver Function Tests: Recent Labs (within last 365 days)        Recent Labs    09/04/18 0833 11/09/18 0824 12/01/18 0745 12/25/18  AST 15 17 24 14   ALT 12 11 11 13   ALKPHOS 66 54 55 104  BILITOT 0.3 0.3 0.7  --   PROT 6.9 6.5 6.1*  --   ALBUMIN 3.7 3.3* 2.9*  --      Recent Labs (within last 365 days)  No results for input(s): LIPASE, AMYLASE in the last 8760 hours.   Recent Labs (within last 365 days)  No results for input(s): AMMONIA in the last 8760 hours.   CBC: Recent Labs (within last 365 days)        Recent Labs    05/19/18 1427 11/22/18 0941 12/25/18 12/28/18  WBC 8.6 11.2* 14.7 13.2  HGB 11.6* 11.0* 9.7* 8.3*  HCT 38.1 36.7 30* 26*  MCV 99.2 101.7*  --   --   PLT 217 317 401* 344     Lipid Recent Labs (within last 365 days)      Recent Labs    09/04/18 0833 11/09/18 0824  CHOL 140 154  HDL 32.70* 33.50*  TRIG 309.0* 269.0*      Cardiac Enzymes:  Assessment and plan.  1.  Leukocytosis-this there appears to be some chronicity to this compared to hospital values-she does not really show signs of infection does not complain of fever chills chest congestion cough diarrhea or dysuria-she is completing a course of antibiotics for suspected UTI-at this point  will have staff monitor for any changes like fever chills cough congestion or dysuria-and will update a CBC with differential on Friday, October 30.  .  2.  History of severe spinal stenosis at L4-5 with disc protrusion with possible L4 nerve root chronic pain-this was treated with steroids in the hospital and analgesics-currently is on Tylenol as well as Norco Aspercreme and Lidoderm patch-as noted above there are concerns with sedation with more aggressive treatment with narcotics at this point will monitor and continue the as needed dose of Norco as needed this will have to be watched.  #3-anemia-hemoglobin actually has shown improvement at 9.2 as compared to 8.3 last week-she is on iron.  4.-History of chronic kidney disease creatinine of 1.49 shows stability-we will have this updated on Friday as well-  [id number]-of note greater than 25 minutes spent assessing patient reviewing her chart and labs-discussing her status with nursing staff-and coordinating and formulating a plan of care-of note greater than 50% of time spent coordinating plan of care with input as noted above

## 2019-01-03 NOTE — Patient Outreach (Signed)
Late entry for 01/02/19  Member assessed for potential [facility] Care Management needs as a benefit of  [place] [employer].  Member is currently receiving rehab therapy at [facility].  Spoke with facility discharge planner. Anticipated disposition plan is to return home with daughter for a few weeks and then return home alone.   Discussed that writer will continue to follow for disposition plans and for potential [facility] Care Management needs.   Marthenia Rolling, MSN-Ed, RN,BSN [place] Acute Care Coordinator [phone] [facility]) [phone]  (Toll free office)

## 2019-01-05 ENCOUNTER — Non-Acute Institutional Stay (SKILLED_NURSING_FACILITY): Payer: Medicare Other | Admitting: Internal Medicine

## 2019-01-05 DIAGNOSIS — E875 Hyperkalemia: Secondary | ICD-10-CM

## 2019-01-06 LAB — BASIC METABOLIC PANEL
BUN: 30 — AB (ref 4–21)
Creatinine: 1.5 — AB (ref 0.5–1.1)
Glucose: 50
Potassium: 4.4 (ref 3.4–5.3)
Sodium: 138 (ref 137–147)

## 2019-01-07 ENCOUNTER — Encounter: Payer: Self-pay | Admitting: Internal Medicine

## 2019-01-08 ENCOUNTER — Encounter: Payer: Self-pay | Admitting: Internal Medicine

## 2019-01-09 ENCOUNTER — Encounter: Payer: Self-pay | Admitting: Internal Medicine

## 2019-01-09 NOTE — Progress Notes (Signed)
Location:  [facility] Room Number: 696-E Place of Service:  SNF (31)  Martinique, Betty G, MD  Patient Care Team: Martinique, Betty G, MD as PCP - General (Family Medicine) [name], MD as PCP - Cardiology (Cardiology) Ardis Hughs, MD as Attending Physician (Urology)  Extended Emergency Contact Information Primary Emergency Contact: Luna Kitchens Address: [address]          [place], [place] [address] [place] Phone: [phone] Mobile Phone: [phone] Relation: Daughter    Allergies: Patient has no known allergies.  Chief Complaint  Patient presents with  . Acute Visit    Patient is seen for hyperkalemia     HPI: Patient is 78 y.o. female who is being seen for potassium of 6.1 on a routine lab.  Patient is not experiencing any chest pain palpitations or shortness of breath.  She has been fine all day  Past Medical History:  Diagnosis Date  . A-fib ([place])   . AAA (abdominal aortic aneurysm) ([place])   . Bilateral hydronephrosis   . Carotid artery stenosis    carotid doppler 06/2012 - Right CCA/Bulb/ICA with chronic occlusion; L vertebral artery with abnormal blood flow; L Bulb/Prox ICA  s/p endarterectomy with mild fibrous plaque, 50% diameter reduction  . CHF (congestive heart failure) ([place])   . Choledocholithiasis 2017  . CKD (chronic kidney disease), stage III   . Coronary artery disease due to lipid rich plaque cardiologist-  dr berry   s/p CABG x6 1997-- cath 12-09-2009 occluded vein to obtuse marginal branch and ramus branch with patent vien to PDA and patent LIMA to LAD, ef 40%-- Myoview 11-24-2011, nonischemic  . Diverticulosis   . Dyspnea on exertion   . GERD (gastroesophageal reflux disease)   . GI bleed   . History of sepsis    10-18-2014 w/ acute pyelonephritis  . Hyperlipidemia   . Hypertension   . Hypothyroidism   . Iron deficiency anemia   . Ischemic cardiomyopathy    03-25-2010-- per  lasts echo EF  50-55%  . Ischemic cardiomyopathy   . PAD (peripheral artery disease) ([place])    09/2010 LEAs - R ABI of 0.45, occluded fem-pop bypass graft, L ABI of 0.59 with occluded SFA; severe arterial insuff  . PONV (postoperative nausea and vomiting)   . PVD (peripheral vascular disease) with claudication ([place])    last duplex 07-04-2015 -- Right CCA and ICA chronic occlusion, 74-25% LICA, Patent vertebral arteries w/ antegrade flow, bilateral normal subclavian arteries   . Retroperitoneal fibrosis   . Tubular adenoma of colon   . Type 2 diabetes mellitus ([place])    monitored by dr Dwyane Dee    Past Surgical History:  Procedure Laterality Date  . AORTA - BILATERAL FEMORAL ARTERY BYPASS GRAFT  1997   and RIGHT FEM-POP   . CARDIAC CATHETERIZATION  12-09-2009  dr al little   EF >40%-- occluded vein to OM & ramus branches, patent vein to PDA, patent LIMA to LAD (Dr. [name], [facility]) - later had thrombectomy of R fem-pop bypass ad R common femoral & profunda femoris artery (Dr. Oneida Alar)  . CARDIOVASCULAR STRESS TEST  11-24-2011   dr berry   Low Risk study: fixed basal to mid inferior attenuation artifact, no reversible ischemia,  normal LV function and wall motion , ef 67%  . CAROTID ENDARTERECTOMY Bilateral right 1994//  left ?  Marland Kitchen CATARACT EXTRACTION W/ INTRAOCULAR LENS  IMPLANT, BILATERAL  2006  . CHOLECYSTECTOMY N/A 09/05/2015   Procedure: ATTEMPTED LAPAROSCOPIC CHOLECYSTECTOMY, EXPLORATORY LAPAROTOMY WITH CHOLECYSTECTOMY;  Surgeon: Autumn Messing III, MD;  Location: [facility];  Service: General;  Laterality: N/A;  . COLONOSCOPY    . COLONOSCOPY  10/21/2015  . COLONOSCOPY WITH PROPOFOL N/A 10/21/2015   Procedure: COLONOSCOPY WITH PROPOFOL;  Surgeon: Milus Banister, MD;  Location: [facility];  Service: Endoscopy;  Laterality: N/A;  . CORONARY ARTERY BYPASS GRAFT  1997   x6; internal mammary to LAD, SVG to ramus #1 & #2, SVG to OM, SVG to PDA,   . CYSTOSCOPY W/ RETROGRADES Right 08/01/2015   Procedure:  CYSTOSCOPY WITH RETROGRADE PYELOGRAM;  Surgeon: Ardis Hughs, MD;  Location: [facility];  Service: Urology;  Laterality: Right;  . CYSTOSCOPY W/ URETERAL STENT PLACEMENT  03/10/2012   Procedure: CYSTOSCOPY WITH RETROGRADE PYELOGRAM/URETERAL STENT PLACEMENT;  Surgeon: Hanley Ben, MD;  Location: [facility];  Service: Urology;  Laterality: Left;  . CYSTOSCOPY W/ URETERAL STENT PLACEMENT Bilateral 03/07/2015   Procedure: BILATERAL RETROGRADE PYELOGRAM AND RIGHT URETERAL STENT PLACEMENT;  Surgeon: Ardis Hughs, MD;  Location: [facility];  Service: Urology;  Laterality: Bilateral;  . CYSTOSCOPY W/ URETERAL STENT PLACEMENT Right 08/01/2015   Procedure: CYSTOSCOPY WITH STENT REPLACEMENT;  Surgeon: Ardis Hughs, MD;  Location: [facility];  Service: Urology;  Laterality: Right;  . CYSTOSCOPY W/ URETERAL STENT PLACEMENT Right 02/13/2016   Procedure: RIGHT URETERAL STENT EXCHANGE;  Surgeon: Ardis Hughs, MD;  Location: [facility];  Service: Urology;  Laterality: Right;  . CYSTOSCOPY W/ URETERAL STENT PLACEMENT Right 09/17/2016   Procedure: CYSTOSCOPY WITH RIGHT  RETROGRADE PYELOGRAM RIGHT URETERAL STENT EXCHANGE;  Surgeon: Ardis Hughs, MD;  Location[facility];  Service: Urology;  Laterality: Right;  . CYSTOSCOPY W/ URETERAL STENT PLACEMENT Bilateral 03/18/2017   Procedure: CYSTOSCOPY WITH BILATERAL  RETROGRADE PYELOGRAM RIGHT URETERAL STENT [place];  Surgeon: Ardis Hughs, MD;  Location[facility];  Service: Urology;  Laterality: Bilateral;  . CYSTOSCOPY W/ URETERAL STENT PLACEMENT Right 05/26/2018   Procedure: CYSTOSCOPY WITH RIGHT RETROGRADE PYELOGRAM RIGHT URETERAL STENT EXCHANGE;  Surgeon: Ardis Hughs, MD;  [facility];  Service: Urology;  Laterality: Right;  . CYSTOSCOPY WITH STENT PLACEMENT Right 10/06/2017   Procedure: CYSTOSCOPY, RETROGRADE  WITH RIGHT STENT EXCHANGE;  Surgeon: Ardis Hughs, MD;  Location:  [facility];  Service: Urology;  Laterality: Right;  . CYSTOSCOPY WITH STENT PLACEMENT Right 12/01/2018   Procedure: CYSTOSCOPY WITH STENT PLACEMENT;  Surgeon: Ardis Hughs, MD;  [facility];  Service: Urology;  Laterality: Right;  . ERCP N/A 09/03/2015   Procedure: ENDOSCOPIC RETROGRADE CHOLANGIOPANCREATOGRAPHY (ERCP);  Surgeon: Irene Shipper, MD;  Location[facility];  Service: Endoscopy;  Laterality: N/A;  . ESOPHAGOGASTRODUODENOSCOPY    . ESOPHAGOGASTRODUODENOSCOPY N/A 10/19/2015   Procedure: ESOPHAGOGASTRODUODENOSCOPY (EGD);  Surgeon: Gatha Mayer, MD;  Location: [facility]COPY;  Service: Endoscopy;  Laterality: N/A;  . GIVENS CAPSULE STUDY  10/21/2015  . GIVENS CAPSULE STUDY N/A 10/21/2015   Procedure: GIVENS CAPSULE STUDY;  Surgeon: Milus Banister, MD;  Location: [facility];  Service: Endoscopy;  Laterality: N/A;  . KNEE SURGERY Right 03-01-2018-03-04-2018   PLATEAU FRACTURE HOSPITLIZATION   . REPAIR RIGHT FEMORAL PSEUDOANEUYSM/  RIGHT FEM-POP BYPASS GRAFT/  DEBRIDEMENT RIGHT LOWER EXTREMITIY VENOUS STATUS ULCERS X2  01-12-2005  . TOTAL ABDOMINAL HYSTERECTOMY W/ BILATERAL SALPINGOOPHORECTOMY  1986  . TRANSTHORACIC ECHOCARDIOGRAM  03/25/2010   EF 29-51%, LV systolic function  low normal with mild inferoseptal hypocontractility; LA mildly dilated; mod MR; mild TR, RV systolic pressure elevated, mild pulm HTN; AV mildly sclerotic; mild pulm valve regurg; aortic root sclerosis/calcif     Allergies as of 01/05/2019   No Known Allergies     Medication List       Accurate as of January 05, 2019 11:59 PM. If you have any questions, ask your nurse or doctor.        STOP taking these medications   lidocaine 5 % Commonly known as: LIDODERM     TAKE these medications   acetaminophen 650 MG CR tablet Commonly known as: TYLENOL Take 1,300 mg by mouth every 8 (eight) hours as needed for pain.   albuterol 108 (90 Base) MCG/ACT inhaler Commonly known as: VENTOLIN HFA INHALE 2  PUFFS INTO THE LUNGS EVERY 6 HOURS AS NEEDED FOR WHEEZING OR SHORTNESS OF BREATH   aspirin 81 MG chewable tablet Chew 81 mg by mouth daily.   atorvastatin 80 MG tablet Commonly known as: LIPITOR Take 1 tablet (80 mg total) by mouth daily.   calcium-vitamin D 500-200 MG-UNIT tablet Commonly known as: OSCAL WITH D Take 1 tablet by mouth daily. In the morning   ciprofloxacin 500 MG tablet Commonly known as: CIPRO Take 500 mg by mouth 2 (two) times daily.   clopidogrel 75 MG tablet Commonly known as: PLAVIX Take 75 mg by mouth daily. *ANTIPLATELET *   docusate sodium 100 MG capsule Commonly known as: COLACE Take 100 mg by mouth 2 (two) times daily.   ergocalciferol 1.25 MG (50000 UT) capsule Commonly known as: VITAMIN D2 Take 50,000 Units by mouth. Every 14 days   ferrous sulfate 325 (65 FE) MG tablet Take 650 mg by mouth every evening.   furosemide 20 MG tablet Commonly known as: LASIX TAKE 1 TABLET(20 MG) BY MOUTH DAILY   glucose blood test strip Commonly known as: ONE TOUCH ULTRA TEST USE TO TEST BLOOD SUGAR 1 TO 2 TIMES DAILY AS DIRECTED   HYDROcodone-acetaminophen 5-325 MG tablet Commonly known as: NORCO/VICODIN Take 1 tablet by mouth every 6 (six) hours as needed for moderate pain or severe pain.   Insulin Pen Needle 32G X 4 MM Misc Use 4 times daily   isosorbide mononitrate 30 MG 24 hr tablet Commonly known as: IMDUR TAKE 1 TABLET BY MOUTH EVERY DAY   latanoprost 0.005 % ophthalmic solution Commonly known as: XALATAN Place 1 drop into both eyes at bedtime.   Levemir 100 UNIT/ML injection Generic drug: insulin detemir Inject 12 Units into the skin at bedtime. Inject 12 units under the skin at bedtime.   levothyroxine 100 MCG tablet Commonly known as: SYNTHROID Take 1 tablet (100 mcg total) by mouth daily.   NovoLOG FlexPen 100 UNIT/ML FlexPen Generic drug: insulin aspart Inject 6 Units into the skin 3 (three) times daily with meals.   polyethylene  glycol 17 g packet Commonly known as: MIRALAX / GLYCOLAX Take 17 g by mouth daily.   tamsulosin 0.4 MG Caps capsule Commonly known as: FLOMAX Take 0.4 mg by mouth daily.   trolamine salicylate 10 % cream Commonly known as: ASPERCREME Apply 1 application topically 2 (two) times daily as needed (arthritis pain.). Apply to lumbosacral area   vitamin B-12 1000 MCG tablet Commonly known as: CYANOCOBALAMIN Take 1,000 mcg by mouth every evening.   vitamin C 500 MG tablet Commonly known as: ASCORBIC ACID Take 500 mg by mouth at bedtime.  No orders of the defined types were placed in this encounter.   Immunization History  Administered Date(s) Administered  . Influenza, High Dose Seasonal PF 12/02/2015, 01/16/2018, 12/29/2018  . Influenza,inj,Quad PF,6+ Mos 02/23/2013, 01/28/2014, 01/02/2015, 02/22/2017  . Influenza-Unspecified 02/23/2013, 01/28/2014, 01/02/2015, 12/02/2015, 02/22/2017, 01/16/2018, 12/07/2018  . Pneumococcal Conjugate-13 07/11/2013    Social History   Tobacco Use  . Smoking status: Former Smoker    Packs/day: 2.00    Years: 30.00    Pack years: 60.00    Quit date: 06/07/2009    Years since quitting: 9.5  . Smokeless tobacco: Never Used  Substance Use Topics  . Alcohol use: Yes    Alcohol/week: 3.0 standard drinks    Types: 3 Standard drinks or equivalent per week    Comment: ocassional    Review of Systems  DATA OBTAINED: from nurse GENERAL:  no fevers, fatigue, appetite changes SKIN: No itching, rash HEENT: No complaint RESPIRATORY: No cough, wheezing, SOB CARDIAC: No chest pain, palpitations, lower extremity edema  GI: No abdominal pain, No N/V/D or constipation, No heartburn or reflux  GU: No dysuria, frequency or urgency, or incontinence  MUSCULOSKELETAL: No unrelieved bone/joint pain NEUROLOGIC: No headache, dizziness  PSYCHIATRIC: No overt anxiety or sadness  Vitals:   01/08/19 1126  BP: 130/72  Pulse: 78  Resp: 18  Temp: 98 F  (36.7 C)   Body mass index is 20.26 kg/m. Physical Exam  GENERAL APPEARANCE: No acute distress  SKIN: No diaphoresis rash HEENT: Unremarkable RESPIRATORY: Breathing is even, unlabored.    CARDIOVASCULAR: No peripheral edema  GASTROINTESTINAL: Abdomen is not distended w/ normal bowel sounds.  GENITOURINARY: Bladder not distended  MUSCULOSKELETAL: No abnormal joints or musculature NEUROLOGIC: Cranial nerves 2-12 grossly intact. Moves all extremities PSYCHIATRIC: Mood and affect appropriate to situation, no behavioral issues  Patient Active Problem List   Diagnosis Date Noted  . Spinal stenosis at L4-L5 level 01/02/2019  . Chronic pain syndrome 01/02/2019  . CAD (coronary artery disease) 01/02/2019  . Chronic prescription opiate use 10/20/2018  . Chronic diarrhea 07/17/2018  . Primary osteoarthritis of right knee 03/01/2018  . Closed fracture of lateral portion of right tibial plateau 03/01/2018  . Pressure injury of skin 01/25/2017  . UTI (urinary tract infection) 01/25/2017  . AKI (acute kidney injury) ([place]) 01/24/2017  . Fall 03/24/2016  . Atrial flutter ([place]) 01/26/2016  . Acute kidney injury superimposed on chronic kidney disease ([place]) 01/26/2016  . PAD (peripheral artery disease) ([place]) 01/26/2016  . Acute on chronic combined systolic and diastolic CHF  76/19/5093  . Melena   . Acute blood loss anemia   . GI bleed 10/18/2015  . Preop cardiovascular exam 09/04/2015  . Choledocholithiasis   . Pressure ulcer 08/29/2015  . Protein-calorie malnutrition, severe 08/29/2015  . Lung nodule   . Acute pyelonephritis 10/16/2014  . Type II or unspecified type diabetes mellitus with renal manifestations, uncontrolled(250.42) 01/10/2013  . Carotid artery disease ([place]) 01/03/2013  . DOE (dyspnea on exertion) 11/20/2012  . Claudication in peripheral vascular disease ([place]) 11/20/2012  . Bradycardia 11/20/2012  . Chronic kidney disease, stage III (moderate) ([place]) 11/08/2012  .  Hyperlipidemia associated with type 2 diabetes mellitus ([place]) 10/01/2012  . Hypothyroidism 10/01/2012  . Chronic combined systolic and diastolic congestive heart failure ([place]) 06/08/2012  . Abdominal pain 03/09/2012  . Altered mental status 03/09/2012  . Diabetes mellitus, type II ([place]) 03/09/2012  . Generalized osteoarthritis of multiple sites 03/09/2012  . HTN (hypertension) 03/09/2012  . Acute encephalopathy 03/09/2012  .  Normocytic anemia 03/09/2012  . Elevated LFTs 03/09/2012  . AAA (abdominal aortic aneurysm) ([place]) 03/09/2012  . Lower urinary tract infectious disease 03/08/2012  . Hx of CABG '97 03/08/2012    CMP     Component Value Date/Time   NA 138 01/06/2019   K 4.4 01/06/2019   CL 107 12/01/2018 0745   CO2 19 (L) 12/01/2018 0745   GLUCOSE 100 (H) 12/01/2018 0745   BUN 30 (A) 01/06/2019   CREATININE 1.5 (A) 01/06/2019   CREATININE 1.50 (H) 12/01/2018 0745   CREATININE 1.38 (H) 03/10/2016 1218   CALCIUM 8.0 (L) 12/01/2018 0745   PROT 6.1 (L) 12/01/2018 0745   ALBUMIN 2.9 (L) 12/01/2018 0745   AST 14 12/25/2018   ALT 13 12/25/2018   ALKPHOS 104 12/25/2018   BILITOT 0.7 12/01/2018 0745   GFRNONAA 33 (L) 12/01/2018 0745   GFRAA 39 (L) 12/01/2018 0745   Recent Labs    11/09/18 0824 11/22/18 0941 12/01/18 0745 12/25/18 01/03/19 01/06/19  NA 137 139 138 138 138  138 138  K 4.6 5.6* 4.7 5.2 6.1*  5.1 4.4  CL 106 108 107  --   --   --   CO2 22 24 19*  --   --   --   GLUCOSE 134* 95 100*  --   --   --   BUN 64* 44* 27* 36* 32*  28* 30*  CREATININE 2.54* 1.68* 1.50* 1.6* 1.8*  1.5* 1.5*  CALCIUM 10.0 9.6 8.0*  --   --   --    Recent Labs    09/04/18 0833 11/09/18 0824 12/01/18 0745 12/25/18  AST 15 17 24 14   ALT 12 11 11 13   ALKPHOS 66 54 55 104  BILITOT 0.3 0.3 0.7  --   PROT 6.9 6.5 6.1*  --   ALBUMIN 3.7 3.3* 2.9*  --    Recent Labs    05/19/18 1427 11/22/18 0941 12/25/18 12/28/18 01/03/19  WBC 8.6 11.2* 14.7 13.2 18.3  16.1  NEUTROABS  --    --   --   --  16  14  HGB 11.6* 11.0* 9.7* 8.3* 9.1*  9.2*  HCT 38.1 36.7 30* 26* 28*  29*  MCV 99.2 101.7*  --   --   --   PLT 217 317 401* 344 471*  262   Recent Labs    09/04/18 0833 11/09/18 0824  CHOL 140 154  TRIG 309.0* 269.0*   Lab Results  Component Value Date   MICROALBUR 10.4 (H) 09/04/2018   Lab Results  Component Value Date   TSH 6.73 (H) 11/09/2018   Lab Results  Component Value Date   HGBA1C 5.6 11/22/2018   Lab Results  Component Value Date   CHOL 154 11/09/2018   HDL 33.50 (L) 11/09/2018   LDLCALC 60 05/21/2016   LDLDIRECT 85.0 11/09/2018   TRIG 269.0 (H) 11/09/2018   CHOLHDL 5 11/09/2018    Significant Diagnostic Results in last 30 days:  No results found.  Assessment and Plan  Hyperkalemia-have ordered Kayexalate 30 g twice today with repeat BMP in the morning    Hennie Duos, MD

## 2019-01-10 ENCOUNTER — Other Ambulatory Visit: Payer: Self-pay | Admitting: Internal Medicine

## 2019-01-10 DIAGNOSIS — M159 Polyosteoarthritis, unspecified: Secondary | ICD-10-CM

## 2019-01-10 DIAGNOSIS — Z79891 Long term (current) use of opiate analgesic: Secondary | ICD-10-CM

## 2019-01-10 MED ORDER — HYDROCODONE-ACETAMINOPHEN 5-325 MG PO TABS: 1.0000 | ORAL_TABLET | Freq: Four times a day (QID) | ORAL | 0 refills | Status: DC | PRN

## 2019-01-17 ENCOUNTER — Ambulatory Visit: Payer: Medicare Other | Admitting: Podiatry

## 2019-01-17 ENCOUNTER — Non-Acute Institutional Stay (SKILLED_NURSING_FACILITY): Payer: Medicare Other | Admitting: Internal Medicine

## 2019-01-17 ENCOUNTER — Other Ambulatory Visit: Payer: Self-pay | Admitting: *Deleted

## 2019-01-17 DIAGNOSIS — N183 Chronic kidney disease, stage 3 unspecified: Secondary | ICD-10-CM | POA: Diagnosis not present

## 2019-01-17 DIAGNOSIS — M159 Polyosteoarthritis, unspecified: Secondary | ICD-10-CM | POA: Diagnosis not present

## 2019-01-17 DIAGNOSIS — M48061 Spinal stenosis, lumbar region without neurogenic claudication: Secondary | ICD-10-CM

## 2019-01-17 DIAGNOSIS — Z794 Long term (current) use of insulin: Secondary | ICD-10-CM

## 2019-01-17 DIAGNOSIS — I25118 Atherosclerotic heart disease of native coronary artery with other forms of angina pectoris: Secondary | ICD-10-CM | POA: Diagnosis not present

## 2019-01-17 DIAGNOSIS — M533 Sacrococcygeal disorders, not elsewhere classified: Secondary | ICD-10-CM | POA: Diagnosis not present

## 2019-01-17 DIAGNOSIS — D649 Anemia, unspecified: Secondary | ICD-10-CM | POA: Diagnosis not present

## 2019-01-17 DIAGNOSIS — M4726 Other spondylosis with radiculopathy, lumbar region: Secondary | ICD-10-CM | POA: Diagnosis not present

## 2019-01-17 DIAGNOSIS — E1159 Type 2 diabetes mellitus with other circulatory complications: Secondary | ICD-10-CM

## 2019-01-17 NOTE — Progress Notes (Signed)
This is a discharge note.  Level care skilled.  Facility [facility].  Chief complaint discharge note.  History of present illness.  Patient is a pleasant 78 year old female with a history of coronary artery disease as well as hypertension type 2 diabetes hyperlipidemia chronic kidney disease stage III as well as recurrent UTIs and bilateral hydronephrosis with the right ureteral ureteral stricture as well as diverticulitis-hypothyroidism-lumbosacral radiculopathy and right lung nodule as well as a chronic pain syndrome.  She came to hospital complaining of left gluteal pain a UA revealed a possible UTI cultures grew out less than 100,000 colonies CFU's but she was treated with Rocephin.  And completed course of Cipro and now is on Keflex prophylactically.  She also has a history of severe spinal stenosis at L4-5with disc protrusion likely affecting the right L4 nerve root-she was treated with steroids and analgesics in the hospital she is on Tylenol here as well as Norco 5-325 mg every 6 hours    She does appear to have some history of somewhat chronic elevated white count-as noted white count was 18,300 back on October 30.  She does not really complain of any fever chills or increased dysuria chest congestion or cough.  She also has a history of chronic kidney disease in fact her potassium was 6.1 approximately 2 weeks ago and she did receive a dose of Kayexalate and it did come down to 4.4 on repeat lab-creatinine appears to be recently in the mid to higher ones   She is being seen tonight for discharge apparently tomorrow to another nursing facility which is closer to her family-it is [facility] [place]  Past Medical History:  Diagnosis Date  . A-fib ([place])   . AAA (abdominal aortic aneurysm) ([place])   . Bilateral hydronephrosis   . Carotid artery stenosis    carotid doppler 06/2012 - Right CCA/Bulb/ICA with chronic occlusion; L vertebral  artery with abnormal blood flow; L Bulb/Prox ICA  s/p endarterectomy with mild fibrous plaque, 50% diameter reduction  . CHF (congestive heart failure) ([place])   . Choledocholithiasis 2017  . CKD (chronic kidney disease), stage III   . Coronary artery disease due to lipid rich plaque cardiologist-  dr berry   s/p CABG x6 1997-- cath 12-09-2009 occluded vein to obtuse marginal branch and ramus branch with patent vien to PDA and patent LIMA to LAD, ef 40%-- Myoview 11-24-2011, nonischemic  . Diverticulosis   . Dyspnea on exertion   . GERD (gastroesophageal reflux disease)   . GI bleed   . History of sepsis    10-18-2014 w/ acute pyelonephritis  . Hyperlipidemia   . Hypertension   . Hypothyroidism   . Iron deficiency anemia   . Ischemic cardiomyopathy    03-25-2010-- per lasts echo EF  50-55%  . Ischemic cardiomyopathy   . PAD (peripheral artery disease) ([place])    09/2010 LEAs - R ABI of 0.45, occluded fem-pop bypass graft, L ABI of 0.59 with occluded SFA; severe arterial insuff  . PONV (postoperative nausea and vomiting)   . PVD (peripheral vascular disease) with claudication ([place])    last duplex 07-04-2015 -- Right CCA and ICA chronic occlusion, 54-09% LICA, Patent vertebral arteries w/ antegrade flow, bilateral normal subclavian arteries   . Retroperitoneal fibrosis   . Tubular adenoma of colon   . Type 2 diabetes mellitus ([place])    monitored by dr Dwyane Dee    Past Surgical History:  Procedure Laterality Date  .  AORTA - BILATERAL FEMORAL ARTERY BYPASS GRAFT  1997   and RIGHT FEM-POP   . CARDIAC CATHETERIZATION  12-09-2009  dr al little   EF >40%-- occluded vein to OM & ramus branches, patent vein to PDA, patent LIMA to LAD (Dr. [name], [facility]) - later had thrombectomy of R fem-pop bypass ad R common femoral & profunda femoris artery (Dr. Oneida Alar)  . CARDIOVASCULAR STRESS TEST  11-24-2011   dr berry   Low Risk study: fixed basal to mid inferior attenuation  artifact, no reversible ischemia,  normal LV function and wall motion , ef 67%  . CAROTID ENDARTERECTOMY Bilateral right 1994//  left ?  Marland Kitchen CATARACT EXTRACTION W/ INTRAOCULAR LENS  IMPLANT, BILATERAL  2006  . CHOLECYSTECTOMY N/A 09/05/2015   Procedure: ATTEMPTED LAPAROSCOPIC CHOLECYSTECTOMY, EXPLORATORY LAPAROTOMY WITH CHOLECYSTECTOMY;  Surgeon: Autumn Messing III, MD;  Location: [place];  Service: General;  Laterality: N/A;  . COLONOSCOPY    . COLONOSCOPY  10/21/2015  . COLONOSCOPY WITH PROPOFOL N/A 10/21/2015   Procedure: COLONOSCOPY WITH PROPOFOL;  Surgeon: Milus Banister, MD;  Location: [place];  Service: Endoscopy;  Laterality: N/A;  . CORONARY ARTERY BYPASS GRAFT  1997   x6; internal mammary to LAD, SVG to ramus #1 & #2, SVG to OM, SVG to PDA,   . CYSTOSCOPY W/ RETROGRADES Right 08/01/2015   Procedure: CYSTOSCOPY WITH RETROGRADE PYELOGRAM;  Surgeon: Ardis Hughs, MD;  Location: [facility];  Service: Urology;  Laterality: Right;  . CYSTOSCOPY W/ URETERAL STENT PLACEMENT  03/10/2012   Procedure: CYSTOSCOPY WITH RETROGRADE PYELOGRAM/URETERAL STENT PLACEMENT;  Surgeon: Hanley Ben, MD;  Location: [facility];  Service: Urology;  Laterality: Left;  . CYSTOSCOPY W/ URETERAL STENT PLACEMENT Bilateral 03/07/2015   Procedure: BILATERAL RETROGRADE PYELOGRAM AND RIGHT URETERAL STENT PLACEMENT;  Surgeon: Ardis Hughs, MD;  Location: [facility];  Service: Urology;  Laterality: Bilateral;  . CYSTOSCOPY W/ URETERAL STENT PLACEMENT Right 08/01/2015   Procedure: CYSTOSCOPY WITH STENT REPLACEMENT;  Surgeon: Ardis Hughs, MD;  Location: [facility];  Service: Urology;  Laterality: Right;  . CYSTOSCOPY W/ URETERAL STENT PLACEMENT Right 02/13/2016   Procedure: RIGHT URETERAL STENT EXCHANGE;  Surgeon: Ardis Hughs, MD;  Location: [facility];  Service: Urology;  Laterality: Right;  . CYSTOSCOPY W/ URETERAL STENT PLACEMENT Right 09/17/2016    Procedure: CYSTOSCOPY WITH RIGHT  RETROGRADE PYELOGRAM RIGHT URETERAL STENT EXCHANGE;  Surgeon: Ardis Hughs, MD;  Location: [facility];  Service: Urology;  Laterality: Right;  . CYSTOSCOPY W/ URETERAL STENT PLACEMENT Bilateral 03/18/2017   Procedure: CYSTOSCOPY WITH BILATERAL  RETROGRADE PYELOGRAM RIGHT URETERAL STENT [place];  Surgeon: Ardis Hughs, MD;  Location: [facility];  Service: Urology;  Laterality: Bilateral;  . CYSTOSCOPY W/ URETERAL STENT PLACEMENT Right 05/26/2018   Procedure: CYSTOSCOPY WITH RIGHT RETROGRADE PYELOGRAM RIGHT URETERAL STENT EXCHANGE;  Surgeon: Ardis Hughs, MD;  Location: [facility];  Service: Urology;  Laterality: Right;  . CYSTOSCOPY WITH STENT PLACEMENT Right 10/06/2017   Procedure: CYSTOSCOPY, RETROGRADE  WITH RIGHT STENT EXCHANGE;  Surgeon: Ardis Hughs, MD;  Location[facility];  Service: Urology;  Laterality: Right;  . CYSTOSCOPY WITH STENT PLACEMENT Right 12/01/2018   Procedure: CYSTOSCOPY WITH STENT PLACEMENT;  Surgeon: Ardis Hughs, MD;  Location[facility];  Service: Urology;  Laterality: Right;  . ERCP N/A 09/03/2015   Procedure: ENDOSCOPIC RETROGRADE CHOLANGIOPANCREATOGRAPHY (ERCP);  Surgeon: Irene Shipper, MD;  Location: [facility];  Service: Endoscopy;  Laterality: N/A;  .  ESOPHAGOGASTRODUODENOSCOPY    . ESOPHAGOGASTRODUODENOSCOPY N/A 10/19/2015   Procedure: ESOPHAGOGASTRODUODENOSCOPY (EGD);  Surgeon: Gatha Mayer, MD;  Location: [facility]COPY;  Service: Endoscopy;  Laterality: N/A;  . GIVENS CAPSULE STUDY  10/21/2015  . GIVENS CAPSULE STUDY N/A 10/21/2015   Procedure: GIVENS CAPSULE STUDY;  Surgeon: Milus Banister, MD;  Location: [place];  Service: Endoscopy;  Laterality: N/A;  . KNEE SURGERY Right 03-01-2018-03-04-2018   PLATEAU FRACTURE HOSPITLIZATION   . REPAIR RIGHT FEMORAL PSEUDOANEUYSM/  RIGHT FEM-POP BYPASS GRAFT/  DEBRIDEMENT RIGHT LOWER EXTREMITIY VENOUS STATUS ULCERS X2  01-12-2005  . TOTAL ABDOMINAL  HYSTERECTOMY W/ BILATERAL SALPINGOOPHORECTOMY  1986  . TRANSTHORACIC ECHOCARDIOGRAM  03/25/2010   EF 10-62%, LV systolic function low normal with mild inferoseptal hypocontractility; LA mildly dilated; mod MR; mild TR, RV systolic pressure elevated, mild pulm HTN; AV mildly sclerotic; mild pulm valve regurg; aortic root sclerosis/calcif     Allergies as of 01/05/2019   No Known Allergies        Medication List                        acetaminophen 650 MG CR tablet Commonly known as: TYLENOL Take 1,300 mg by mouth every 8 (eight) hours as needed for pain.   albuterol 108 (90 Base) MCG/ACT inhaler Commonly known as: VENTOLIN HFA INHALE 2 PUFFS INTO THE LUNGS EVERY 6 HOURS AS NEEDED FOR WHEEZING OR SHORTNESS OF BREATH   aspirin 81 MG chewable tablet Chew 81 mg by mouth daily.   atorvastatin 80 MG tablet Commonly known as: LIPITOR Take 1 tablet (80 mg total) by mouth daily.   calcium-vitamin D 500-200 MG-UNIT tablet Commonly known as: OSCAL WITH D Take 1 tablet by mouth daily. In the morning   Keflex 500 mg p.o. daily for prophylaxis    clopidogrel 75 MG tablet Commonly known as: PLAVIX Take 75 mg by mouth daily. *ANTIPLATELET *   docusate sodium 100 MG capsule Commonly known as: COLACE Take 100 mg by mouth 2 (two) times daily.   ergocalciferol 1.25 MG (50000 UT) capsule Commonly known as: VITAMIN D2 Take 50,000 Units by mouth. Every 14 days   ferrous sulfate 325 (65 FE) MG tablet Take 650 mg by mouth every evening.   furosemide 20 MG tablet Commonly known as: LASIX TAKE 1 TABLET(20 MG) BY MOUTH DAILY   glucose blood test strip Commonly known as: ONE TOUCH ULTRA TEST USE TO TEST BLOOD SUGAR 1 TO 2 TIMES DAILY AS DIRECTED   HYDROcodone-acetaminophen 5-325 MG tablet Commonly known as: NORCO/VICODIN Take 1 tablet by mouth every 6 (six) hours as needed for moderate pain or severe pain.   Insulin Pen Needle 32G X 4 MM Misc Use 4  times daily   isosorbide mononitrate 30 MG 24 hr tablet Commonly known as: IMDUR TAKE 1 TABLET BY MOUTH EVERY DAY   latanoprost 0.005 % ophthalmic solution Commonly known as: XALATAN Place 1 drop into both eyes at bedtime.   Levemir 100 UNIT/ML injection Generic drug: insulin detemir Inject 6 Units into the skin at bedtime. Inject 12 units under the skin at bedtime.   levothyroxine 100 MCG tablet Commonly known as: SYNTHROID Take 1 tablet (100 mcg total) by mouth daily.   NovoLOG FlexPen 100 UNIT/ML FlexPen Generic drug: insulin aspart Inject 6 Units into the skin 3 (three) times daily with meals.   polyethylene glycol 17 g packet Commonly known as: MIRALAX / GLYCOLAX Take 17 g by mouth daily.  tamsulosin 0.4 MG Caps capsule Commonly known as: FLOMAX Take 0.4 mg by mouth daily.   trolamine salicylate 10 % cream Commonly known as: ASPERCREME Apply 1 application topically 2 (two) times daily as needed (arthritis pain.). Apply to lumbosacral area   vitamin B-12 1000 MCG tablet Commonly known as: CYANOCOBALAMIN Take 1,000 mcg by mouth every evening.   vitamin C 500 MG tablet Commonly known as: ASCORBIC ACID Take 500 mg by mouth at bedtime.       No orders of the defined types were placed in this encounter.       Immunization History  Administered Date(s) Administered  . Influenza, High Dose Seasonal PF 12/02/2015, 01/16/2018, 12/29/2018  . Influenza,inj,Quad PF,6+ Mos 02/23/2013, 01/28/2014, 01/02/2015, 02/22/2017  . Influenza-Unspecified 02/23/2013, 01/28/2014, 01/02/2015, 12/02/2015, 02/22/2017, 01/16/2018, 12/07/2018  . Pneumococcal Conjugate-13 07/11/2013    Social History        Tobacco Use  . Smoking status: Former Smoker    Packs/day: 2.00    Years: 30.00    Pack years: 60.00    Quit date: 06/07/2009    Years since quitting: 9.5  . Smokeless tobacco: Never Used  Substance Use Topics  . Alcohol use: Yes     Alcohol/week: 3.0 standard drinks    Types: 3 Standard drinks or equivalent per week    Comment: ocassional   Review of systems.  In general she is not complaining of any fever chills says she feels relatively good.  Skin does not complain of rashes or itching.  Head ears eyes nose mouth and throat is not complain of visual changes or sore throat.  Respiratory is not complaining of shortness of breath or cough.  Cardiac is not complaining of chest pain or increased edema.  GI is not complaining of abdominal discomfort nausea vomiting diarrhea constipation or diarrhea.  GU does not complain of dysuria.  Musculoskeletal has significant weakness with a history of back pain she says at this point is relatively well controlled although it still hinders her mobility.  Neurologic does not complain of dizziness headache or syncope.  Psych does not complain of being depressed or anxious   Physical exam.  Temperature is 98.0 pulse 82 respirations 18 blood pressure 120/70.  In general this is a pleasant elderly female in no distress sitting comfortably in a wheelchair.  Her skin is warm and dry.  Eyes visual acuity appears grossly intact sclera and conjunctive are clear.  Oropharynx is clear mucous membranes moist.  Chest is clear to auscultation there is no labored breathing air entry is slightly shallow.  Heart is regular rate and rhythm with an occasional skipped beat although this is fairly rare she does not have significant lower extremity edema.  Abdomen is soft nontender with positive bowel sounds.  GU could not really appreciate suprapubic tenderness.  Musculoskeletal is able to move all extremities x4 with significant lower extremity weakness.  Neurologic is grossly intact cranial nerves appear to be intact her speech is clear.  Psych she is pleasant alert and grossly oriented.  Labs.  January 06, 2019.  Sodium 138 potassium 4.4 BUN 30.4 creatinine 1.54.   January 05, 2019.  Sodium 138 potassium 6.1 creatinine 1.83 BUN 32.3.  January 05, 2019.  WBC 18.3 hemoglobin 9.1 platelets 471.  January 03, 2019.  WBC 16.1 hemoglobin 9.2 platelets 462.    Assessment and plan.  1.-History of severe spinal stenosis at L4-L5 with disc protrusion likely affecting the L4 nerve root with chronic pain-she was treated with  steroids and analgesics in the hospital-she has been here for OT and PT and has made some progress.  She does receive Norco 5-3 25 every 6 hours as needed for pain this appears to be helping she will need continued PT and OT.  2.  History of UTI-initial UA in the hospital seemed promising for an acute UTI but urine cultures grew out less than 100,000 colonies CFU's she was treated with IV Rocephin and discharged on ciprofloxacin she is now on Keflex prophylactically as well as Flomax 0.4 mg a day.  3.  History of coronary artery disease this is been stable during her stay here with no complaints of chest pain she is on aspirin 81 mg a day as well as Plavix 75 mg a day in addition to Imdur 30 mg a day she is also on a statin.  4.-Hyperlipidemia again she does continue on atorvastatin 80 mg a day   #5 history of type 2 diabetes she continues on Levemir 12 units a day and note at times her blood sugars are somewhat low in the morning recently in the 70s and 80s will reduce her Levemir to 6 units a day-with follow-up by primary care provider at new facility.  She also receives NovoLog 6 units with meals.  6.-History of hypothyroidism she continues on Synthroid 100 mcg daily.  7.  History of chronic kidney disease it appears her baseline creatinine has been quite variable per chart review appears to hover more on the mid ones with fluctuations-it looks like 2 months ago it was actually 2.54 with BUN of 64 but has been lower than that since.  Most recent creatinine was 1.54-will update this tomorrow morning but will have to be followed up  by the new facility since she is going home apparently around noon tomorrow.  8.  History of hyperkalemia she did have an elevated potassium of 6.1 on the lab done late last month she did receive Kayexalate and again down to 4.4-- again will have this updated and have a new facility notified of the updated lab  #9-history of anemia she is on iron recent hemoglobin was 9.1 again will update this and have new facility notified of results.  10.  Leukocytosis appears to have some chronicity to this and has been elevated during her stay in facility will have this updated and again new facility notified of results for follow-up-she does not complain of any fever chills cough congestion or increased dysuria-last white count appears was 18,300 on October 30-appears white count hovered in the low mid teens in the hospital.  11.   History of CHF-she is on low-dose Lasix she is does not show evidence of edema or fluid overload    [id number]-of note greater than 30 minutes spent on this discharge summary-greater than 50% of time spent coordinating a plan of care for numerous diagnoses.  Addendum.  On January 18, 2019 we did receive updated labs after patient had been discharged to her new facility.  Sodium is 135 potassium 5.3 creatinine is up somewhat from previous level at 2.2 with a BUN of 69-  white count also has gone up somewhat to 22,600 hemoglobin is down to 8.4 previously had been 9.1 platelets are mildly elevated at 535  Have spoken with nursing who will make new facility aware of the labs for follow-up by their medical provider

## 2019-01-17 NOTE — Patient Outreach (Signed)
Member assessed for potential [facility] Care Management needs as a benefit of  [place] [employer].  Member is currently receiving rehab therapy at [facility].  Member discussed in weekly telephonic IDT meeting with facility staff, [facility] UM team, and writer.  Facility reports discharge was appealed.   Writer will follow up facility dc planner prior to contacting member's daughter.  Marthenia Rolling, MSN-Ed, RN,BSN [place] Acute Care Coordinator [phone] [facility]) [phone]  (Toll free office)

## 2019-01-18 ENCOUNTER — Encounter: Payer: Self-pay | Admitting: Internal Medicine

## 2019-01-18 ENCOUNTER — Other Ambulatory Visit: Payer: Self-pay | Admitting: *Deleted

## 2019-01-18 DIAGNOSIS — E162 Hypoglycemia, unspecified: Secondary | ICD-10-CM | POA: Diagnosis not present

## 2019-01-18 DIAGNOSIS — K219 Gastro-esophageal reflux disease without esophagitis: Secondary | ICD-10-CM | POA: Diagnosis present

## 2019-01-18 DIAGNOSIS — Z951 Presence of aortocoronary bypass graft: Secondary | ICD-10-CM | POA: Diagnosis present

## 2019-01-18 DIAGNOSIS — I255 Ischemic cardiomyopathy: Secondary | ICD-10-CM | POA: Diagnosis present

## 2019-01-18 DIAGNOSIS — R4182 Altered mental status, unspecified: Secondary | ICD-10-CM | POA: Diagnosis not present

## 2019-01-18 DIAGNOSIS — E119 Type 2 diabetes mellitus without complications: Secondary | ICD-10-CM | POA: Diagnosis not present

## 2019-01-18 DIAGNOSIS — A419 Sepsis, unspecified organism: Secondary | ICD-10-CM | POA: Diagnosis present

## 2019-01-18 DIAGNOSIS — Z781 Physical restraint status: Secondary | ICD-10-CM | POA: Diagnosis not present

## 2019-01-18 DIAGNOSIS — E1122 Type 2 diabetes mellitus with diabetic chronic kidney disease: Secondary | ICD-10-CM | POA: Diagnosis present

## 2019-01-18 DIAGNOSIS — I6529 Occlusion and stenosis of unspecified carotid artery: Secondary | ICD-10-CM | POA: Diagnosis not present

## 2019-01-18 DIAGNOSIS — Z87891 Personal history of nicotine dependence: Secondary | ICD-10-CM | POA: Diagnosis not present

## 2019-01-18 DIAGNOSIS — M5417 Radiculopathy, lumbosacral region: Secondary | ICD-10-CM | POA: Diagnosis present

## 2019-01-18 DIAGNOSIS — I10 Essential (primary) hypertension: Secondary | ICD-10-CM | POA: Diagnosis not present

## 2019-01-18 DIAGNOSIS — Z87440 Personal history of urinary (tract) infections: Secondary | ICD-10-CM | POA: Diagnosis not present

## 2019-01-18 DIAGNOSIS — D509 Iron deficiency anemia, unspecified: Secondary | ICD-10-CM | POA: Diagnosis present

## 2019-01-18 DIAGNOSIS — D638 Anemia in other chronic diseases classified elsewhere: Secondary | ICD-10-CM | POA: Diagnosis present

## 2019-01-18 DIAGNOSIS — X58XXXA Exposure to other specified factors, initial encounter: Secondary | ICD-10-CM | POA: Diagnosis not present

## 2019-01-18 DIAGNOSIS — J9601 Acute respiratory failure with hypoxia: Secondary | ICD-10-CM | POA: Diagnosis present

## 2019-01-18 DIAGNOSIS — R410 Disorientation, unspecified: Secondary | ICD-10-CM | POA: Diagnosis not present

## 2019-01-18 DIAGNOSIS — R4020 Unspecified coma: Secondary | ICD-10-CM | POA: Diagnosis not present

## 2019-01-18 DIAGNOSIS — N131 Hydronephrosis with ureteral stricture, not elsewhere classified: Secondary | ICD-10-CM | POA: Diagnosis present

## 2019-01-18 DIAGNOSIS — G9341 Metabolic encephalopathy: Secondary | ICD-10-CM | POA: Diagnosis present

## 2019-01-18 DIAGNOSIS — N183 Chronic kidney disease, stage 3 unspecified: Secondary | ICD-10-CM | POA: Diagnosis present

## 2019-01-18 DIAGNOSIS — I2510 Atherosclerotic heart disease of native coronary artery without angina pectoris: Secondary | ICD-10-CM | POA: Diagnosis present

## 2019-01-18 DIAGNOSIS — Y998 Other external cause status: Secondary | ICD-10-CM | POA: Diagnosis not present

## 2019-01-18 DIAGNOSIS — N179 Acute kidney failure, unspecified: Secondary | ICD-10-CM | POA: Diagnosis present

## 2019-01-18 DIAGNOSIS — T68XXXA Hypothermia, initial encounter: Secondary | ICD-10-CM | POA: Diagnosis not present

## 2019-01-18 DIAGNOSIS — M545 Low back pain: Secondary | ICD-10-CM | POA: Diagnosis present

## 2019-01-18 DIAGNOSIS — M4697 Unspecified inflammatory spondylopathy, lumbosacral region: Secondary | ICD-10-CM | POA: Diagnosis present

## 2019-01-18 DIAGNOSIS — R0902 Hypoxemia: Secondary | ICD-10-CM | POA: Diagnosis not present

## 2019-01-18 DIAGNOSIS — E11649 Type 2 diabetes mellitus with hypoglycemia without coma: Secondary | ICD-10-CM | POA: Diagnosis present

## 2019-01-18 DIAGNOSIS — E872 Acidosis: Secondary | ICD-10-CM | POA: Diagnosis not present

## 2019-01-18 DIAGNOSIS — I480 Paroxysmal atrial fibrillation: Secondary | ICD-10-CM | POA: Diagnosis not present

## 2019-01-18 DIAGNOSIS — R5381 Other malaise: Secondary | ICD-10-CM | POA: Diagnosis not present

## 2019-01-18 DIAGNOSIS — I7389 Other specified peripheral vascular diseases: Secondary | ICD-10-CM | POA: Diagnosis not present

## 2019-01-18 DIAGNOSIS — G9340 Encephalopathy, unspecified: Secondary | ICD-10-CM | POA: Diagnosis not present

## 2019-01-18 DIAGNOSIS — R0689 Other abnormalities of breathing: Secondary | ICD-10-CM | POA: Diagnosis not present

## 2019-01-18 DIAGNOSIS — R6520 Severe sepsis without septic shock: Secondary | ICD-10-CM | POA: Diagnosis present

## 2019-01-18 DIAGNOSIS — E876 Hypokalemia: Secondary | ICD-10-CM | POA: Diagnosis not present

## 2019-01-18 DIAGNOSIS — I129 Hypertensive chronic kidney disease with stage 1 through stage 4 chronic kidney disease, or unspecified chronic kidney disease: Secondary | ICD-10-CM | POA: Diagnosis present

## 2019-01-18 DIAGNOSIS — E870 Hyperosmolality and hypernatremia: Secondary | ICD-10-CM | POA: Diagnosis not present

## 2019-01-18 DIAGNOSIS — E1151 Type 2 diabetes mellitus with diabetic peripheral angiopathy without gangrene: Secondary | ICD-10-CM | POA: Diagnosis present

## 2019-01-18 DIAGNOSIS — E161 Other hypoglycemia: Secondary | ICD-10-CM | POA: Diagnosis not present

## 2019-01-18 DIAGNOSIS — J9600 Acute respiratory failure, unspecified whether with hypoxia or hypercapnia: Secondary | ICD-10-CM | POA: Diagnosis not present

## 2019-01-18 DIAGNOSIS — R404 Transient alteration of awareness: Secondary | ICD-10-CM | POA: Diagnosis not present

## 2019-01-18 DIAGNOSIS — Z20828 Contact with and (suspected) exposure to other viral communicable diseases: Secondary | ICD-10-CM | POA: Diagnosis present

## 2019-01-18 DIAGNOSIS — E039 Hypothyroidism, unspecified: Secondary | ICD-10-CM | POA: Diagnosis present

## 2019-01-18 DIAGNOSIS — R1310 Dysphagia, unspecified: Secondary | ICD-10-CM | POA: Diagnosis present

## 2019-01-18 DIAGNOSIS — E785 Hyperlipidemia, unspecified: Secondary | ICD-10-CM | POA: Diagnosis present

## 2019-01-18 DIAGNOSIS — N136 Pyonephrosis: Secondary | ICD-10-CM | POA: Diagnosis present

## 2019-01-18 DIAGNOSIS — I5189 Other ill-defined heart diseases: Secondary | ICD-10-CM | POA: Diagnosis not present

## 2019-01-18 DIAGNOSIS — M5416 Radiculopathy, lumbar region: Secondary | ICD-10-CM | POA: Diagnosis not present

## 2019-01-18 MED ORDER — ALBUTEROL SULFATE HFA 108 (90 BASE) MCG/ACT IN AERS: INHALATION_SPRAY | RESPIRATORY_TRACT | 0 refills | Status: AC

## 2019-01-18 MED ORDER — ISOSORBIDE MONONITRATE ER 30 MG PO TB24: 30.0000 mg | ORAL_TABLET | Freq: Every day | ORAL | 0 refills | Status: AC

## 2019-01-18 MED ORDER — LATANOPROST 0.005 % OP SOLN: 1.0000 [drp] | Freq: Every day | OPHTHALMIC | 0 refills | Status: AC

## 2019-01-18 MED ORDER — FUROSEMIDE 20 MG PO TABS: ORAL_TABLET | ORAL | 0 refills | Status: AC

## 2019-01-18 MED ORDER — HYDROCODONE-ACETAMINOPHEN 5-325 MG PO TABS: 1.0000 | ORAL_TABLET | Freq: Four times a day (QID) | ORAL | 0 refills | Status: AC | PRN

## 2019-01-18 MED ORDER — ATORVASTATIN CALCIUM 80 MG PO TABS: 80.0000 mg | ORAL_TABLET | Freq: Every day | ORAL | 1 refills | Status: AC

## 2019-01-18 MED ORDER — CEPHALEXIN 250 MG PO CAPS: 500.0000 mg | ORAL_CAPSULE | Freq: Every day | ORAL | 0 refills | Status: AC

## 2019-01-18 MED ORDER — NOVOLOG FLEXPEN 100 UNIT/ML ~~LOC~~ SOPN: 6.0000 [IU] | PEN_INJECTOR | Freq: Three times a day (TID) | SUBCUTANEOUS | 0 refills | Status: AC

## 2019-01-18 MED ORDER — LEVOTHYROXINE SODIUM 100 MCG PO TABS: 100.0000 ug | ORAL_TABLET | Freq: Every day | ORAL | 0 refills | Status: AC

## 2019-01-18 MED ORDER — CLOPIDOGREL BISULFATE 75 MG PO TABS: 75.0000 mg | ORAL_TABLET | Freq: Every day | ORAL | 0 refills | Status: AC

## 2019-01-18 MED ORDER — TAMSULOSIN HCL 0.4 MG PO CAPS: 0.4000 mg | ORAL_CAPSULE | Freq: Every day | ORAL | 0 refills | Status: AC

## 2019-01-18 MED ORDER — FERROUS SULFATE 325 (65 FE) MG PO TABS: 650.0000 mg | ORAL_TABLET | Freq: Every evening | ORAL | 0 refills | Status: AC

## 2019-01-18 MED ORDER — VITAMIN B-12 1000 MCG PO TABS: 1000.0000 ug | ORAL_TABLET | Freq: Every evening | ORAL | 0 refills | Status: DC

## 2019-01-18 MED ORDER — INSULIN DETEMIR 100 UNIT/ML ~~LOC~~ SOLN: 6.0000 [IU] | Freq: Every day | SUBCUTANEOUS | 0 refills | Status: AC

## 2019-01-18 NOTE — Patient Outreach (Signed)
Late entry for 01/17/19  Member assessed for potential [facility] Care Management needs as a benefit of [place] Medicare.  Spoke with facility Geographical information systems officer at [facility]. Mrs. Maness did not win appeal. Disposition plan is now to transfer to another rehab facility closer to her daughter and ultimately transition to ALF.   Will continue to follow for disposition plan while member resides in [facility].  Marthenia Rolling, MSN-Ed, RN,BSN [place] Acute Care Coordinator [phone] [facility]) [phone]  (Toll free office)

## 2019-01-18 NOTE — Progress Notes (Signed)
This encounter was created in error - please disregard.

## 2019-01-19 ENCOUNTER — Other Ambulatory Visit: Payer: Self-pay | Admitting: *Deleted

## 2019-01-19 NOTE — Patient Outreach (Signed)
Member assessed for potential [facility] Care Management needs as a benefit of [place] Medicare.  Confirmed in Patient [place] that member discharged on 01/18/19 from [facility]. Discharged to [place].   No identifiable [facility] Care Management needs.   Marthenia Rolling, MSN-Ed, RN,BSN [place] Acute Care Coordinator [phone] [facility]) [phone]  (Toll free office)

## 2019-01-20 DIAGNOSIS — R1310 Dysphagia, unspecified: Secondary | ICD-10-CM | POA: Diagnosis not present

## 2019-01-20 DIAGNOSIS — E872 Acidosis: Secondary | ICD-10-CM | POA: Diagnosis not present

## 2019-01-20 DIAGNOSIS — Y998 Other external cause status: Secondary | ICD-10-CM | POA: Diagnosis not present

## 2019-01-20 DIAGNOSIS — G9341 Metabolic encephalopathy: Secondary | ICD-10-CM | POA: Diagnosis present

## 2019-01-20 DIAGNOSIS — M545 Low back pain: Secondary | ICD-10-CM | POA: Diagnosis not present

## 2019-01-20 DIAGNOSIS — Z7401 Bed confinement status: Secondary | ICD-10-CM | POA: Diagnosis not present

## 2019-01-20 DIAGNOSIS — E039 Hypothyroidism, unspecified: Secondary | ICD-10-CM | POA: Diagnosis not present

## 2019-01-20 DIAGNOSIS — Z20828 Contact with and (suspected) exposure to other viral communicable diseases: Secondary | ICD-10-CM | POA: Diagnosis not present

## 2019-01-20 DIAGNOSIS — T68XXXA Hypothermia, initial encounter: Secondary | ICD-10-CM | POA: Diagnosis not present

## 2019-01-20 DIAGNOSIS — J189 Pneumonia, unspecified organism: Secondary | ICD-10-CM | POA: Diagnosis present

## 2019-01-20 DIAGNOSIS — Z9981 Dependence on supplemental oxygen: Secondary | ICD-10-CM | POA: Diagnosis not present

## 2019-01-20 DIAGNOSIS — E162 Hypoglycemia, unspecified: Secondary | ICD-10-CM | POA: Diagnosis not present

## 2019-01-20 DIAGNOSIS — D631 Anemia in chronic kidney disease: Secondary | ICD-10-CM | POA: Diagnosis not present

## 2019-01-20 DIAGNOSIS — R404 Transient alteration of awareness: Secondary | ICD-10-CM | POA: Diagnosis not present

## 2019-01-20 DIAGNOSIS — N189 Chronic kidney disease, unspecified: Secondary | ICD-10-CM | POA: Diagnosis not present

## 2019-01-20 DIAGNOSIS — I10 Essential (primary) hypertension: Secondary | ICD-10-CM | POA: Diagnosis not present

## 2019-01-20 DIAGNOSIS — I739 Peripheral vascular disease, unspecified: Secondary | ICD-10-CM | POA: Diagnosis not present

## 2019-01-20 DIAGNOSIS — E1110 Type 2 diabetes mellitus with ketoacidosis without coma: Secondary | ICD-10-CM | POA: Diagnosis not present

## 2019-01-20 DIAGNOSIS — N1330 Unspecified hydronephrosis: Secondary | ICD-10-CM | POA: Diagnosis not present

## 2019-01-20 DIAGNOSIS — R410 Disorientation, unspecified: Secondary | ICD-10-CM | POA: Diagnosis not present

## 2019-01-20 DIAGNOSIS — E785 Hyperlipidemia, unspecified: Secondary | ICD-10-CM | POA: Diagnosis present

## 2019-01-20 DIAGNOSIS — K219 Gastro-esophageal reflux disease without esophagitis: Secondary | ICD-10-CM | POA: Diagnosis not present

## 2019-01-20 DIAGNOSIS — N390 Urinary tract infection, site not specified: Secondary | ICD-10-CM | POA: Diagnosis not present

## 2019-01-20 DIAGNOSIS — N136 Pyonephrosis: Secondary | ICD-10-CM | POA: Diagnosis not present

## 2019-01-20 DIAGNOSIS — D638 Anemia in other chronic diseases classified elsewhere: Secondary | ICD-10-CM | POA: Diagnosis not present

## 2019-01-20 DIAGNOSIS — G9340 Encephalopathy, unspecified: Secondary | ICD-10-CM | POA: Diagnosis not present

## 2019-01-20 DIAGNOSIS — E161 Other hypoglycemia: Secondary | ICD-10-CM | POA: Diagnosis not present

## 2019-01-20 DIAGNOSIS — Z87440 Personal history of urinary (tract) infections: Secondary | ICD-10-CM | POA: Diagnosis not present

## 2019-01-20 DIAGNOSIS — X58XXXA Exposure to other specified factors, initial encounter: Secondary | ICD-10-CM | POA: Diagnosis not present

## 2019-01-20 DIAGNOSIS — Z781 Physical restraint status: Secondary | ICD-10-CM | POA: Diagnosis not present

## 2019-01-20 DIAGNOSIS — M2550 Pain in unspecified joint: Secondary | ICD-10-CM | POA: Diagnosis not present

## 2019-01-20 DIAGNOSIS — E1122 Type 2 diabetes mellitus with diabetic chronic kidney disease: Secondary | ICD-10-CM | POA: Diagnosis not present

## 2019-01-20 DIAGNOSIS — M5417 Radiculopathy, lumbosacral region: Secondary | ICD-10-CM | POA: Diagnosis present

## 2019-01-20 DIAGNOSIS — R531 Weakness: Secondary | ICD-10-CM | POA: Diagnosis not present

## 2019-01-20 DIAGNOSIS — J9690 Respiratory failure, unspecified, unspecified whether with hypoxia or hypercapnia: Secondary | ICD-10-CM | POA: Diagnosis not present

## 2019-01-20 DIAGNOSIS — J9600 Acute respiratory failure, unspecified whether with hypoxia or hypercapnia: Secondary | ICD-10-CM | POA: Diagnosis not present

## 2019-01-20 DIAGNOSIS — A419 Sepsis, unspecified organism: Secondary | ICD-10-CM | POA: Diagnosis not present

## 2019-01-20 DIAGNOSIS — J9601 Acute respiratory failure with hypoxia: Secondary | ICD-10-CM | POA: Diagnosis not present

## 2019-01-20 DIAGNOSIS — E1151 Type 2 diabetes mellitus with diabetic peripheral angiopathy without gangrene: Secondary | ICD-10-CM | POA: Diagnosis present

## 2019-01-20 DIAGNOSIS — N179 Acute kidney failure, unspecified: Secondary | ICD-10-CM | POA: Diagnosis not present

## 2019-01-20 DIAGNOSIS — R4182 Altered mental status, unspecified: Secondary | ICD-10-CM | POA: Diagnosis not present

## 2019-01-20 DIAGNOSIS — I129 Hypertensive chronic kidney disease with stage 1 through stage 4 chronic kidney disease, or unspecified chronic kidney disease: Secondary | ICD-10-CM | POA: Diagnosis present

## 2019-01-20 DIAGNOSIS — N183 Chronic kidney disease, stage 3 unspecified: Secondary | ICD-10-CM | POA: Diagnosis not present

## 2019-01-20 DIAGNOSIS — R0902 Hypoxemia: Secondary | ICD-10-CM | POA: Diagnosis not present

## 2019-01-20 DIAGNOSIS — E11649 Type 2 diabetes mellitus with hypoglycemia without coma: Secondary | ICD-10-CM | POA: Diagnosis not present

## 2019-01-20 DIAGNOSIS — Z951 Presence of aortocoronary bypass graft: Secondary | ICD-10-CM | POA: Diagnosis not present

## 2019-01-20 DIAGNOSIS — Z87891 Personal history of nicotine dependence: Secondary | ICD-10-CM | POA: Diagnosis not present

## 2019-01-20 DIAGNOSIS — M4697 Unspecified inflammatory spondylopathy, lumbosacral region: Secondary | ICD-10-CM | POA: Diagnosis present

## 2019-01-20 DIAGNOSIS — N131 Hydronephrosis with ureteral stricture, not elsewhere classified: Secondary | ICD-10-CM | POA: Diagnosis present

## 2019-01-20 DIAGNOSIS — R0689 Other abnormalities of breathing: Secondary | ICD-10-CM | POA: Diagnosis not present

## 2019-01-20 DIAGNOSIS — E876 Hypokalemia: Secondary | ICD-10-CM | POA: Diagnosis not present

## 2019-01-20 DIAGNOSIS — Z4682 Encounter for fitting and adjustment of non-vascular catheter: Secondary | ICD-10-CM | POA: Diagnosis not present

## 2019-01-20 DIAGNOSIS — R6520 Severe sepsis without septic shock: Secondary | ICD-10-CM | POA: Diagnosis not present

## 2019-01-20 DIAGNOSIS — E870 Hyperosmolality and hypernatremia: Secondary | ICD-10-CM | POA: Diagnosis not present

## 2019-01-20 DIAGNOSIS — I2510 Atherosclerotic heart disease of native coronary artery without angina pectoris: Secondary | ICD-10-CM | POA: Diagnosis not present

## 2019-01-20 DIAGNOSIS — R4020 Unspecified coma: Secondary | ICD-10-CM | POA: Diagnosis not present

## 2019-01-25 ENCOUNTER — Other Ambulatory Visit: Payer: Medicare Other

## 2019-01-27 DIAGNOSIS — N131 Hydronephrosis with ureteral stricture, not elsewhere classified: Secondary | ICD-10-CM | POA: Diagnosis present

## 2019-01-27 DIAGNOSIS — G9340 Encephalopathy, unspecified: Secondary | ICD-10-CM | POA: Diagnosis not present

## 2019-01-27 DIAGNOSIS — I10 Essential (primary) hypertension: Secondary | ICD-10-CM | POA: Diagnosis not present

## 2019-01-27 DIAGNOSIS — E1151 Type 2 diabetes mellitus with diabetic peripheral angiopathy without gangrene: Secondary | ICD-10-CM | POA: Diagnosis present

## 2019-01-27 DIAGNOSIS — A419 Sepsis, unspecified organism: Secondary | ICD-10-CM | POA: Diagnosis not present

## 2019-01-27 DIAGNOSIS — M5417 Radiculopathy, lumbosacral region: Secondary | ICD-10-CM | POA: Diagnosis present

## 2019-01-27 DIAGNOSIS — Z7401 Bed confinement status: Secondary | ICD-10-CM | POA: Diagnosis not present

## 2019-01-27 DIAGNOSIS — J9601 Acute respiratory failure with hypoxia: Secondary | ICD-10-CM | POA: Diagnosis present

## 2019-01-27 DIAGNOSIS — R339 Retention of urine, unspecified: Secondary | ICD-10-CM | POA: Diagnosis not present

## 2019-01-27 DIAGNOSIS — R1310 Dysphagia, unspecified: Secondary | ICD-10-CM | POA: Diagnosis not present

## 2019-01-27 DIAGNOSIS — B3789 Other sites of candidiasis: Secondary | ICD-10-CM | POA: Diagnosis not present

## 2019-01-27 DIAGNOSIS — R5381 Other malaise: Secondary | ICD-10-CM | POA: Diagnosis not present

## 2019-01-27 DIAGNOSIS — R531 Weakness: Secondary | ICD-10-CM | POA: Diagnosis not present

## 2019-01-27 DIAGNOSIS — N1330 Unspecified hydronephrosis: Secondary | ICD-10-CM | POA: Diagnosis not present

## 2019-01-27 DIAGNOSIS — N183 Chronic kidney disease, stage 3 unspecified: Secondary | ICD-10-CM | POA: Diagnosis not present

## 2019-01-27 DIAGNOSIS — J188 Other pneumonia, unspecified organism: Secondary | ICD-10-CM | POA: Diagnosis not present

## 2019-01-27 DIAGNOSIS — R918 Other nonspecific abnormal finding of lung field: Secondary | ICD-10-CM | POA: Diagnosis not present

## 2019-01-27 DIAGNOSIS — E785 Hyperlipidemia, unspecified: Secondary | ICD-10-CM | POA: Diagnosis not present

## 2019-01-27 DIAGNOSIS — I739 Peripheral vascular disease, unspecified: Secondary | ICD-10-CM | POA: Diagnosis not present

## 2019-01-27 DIAGNOSIS — I129 Hypertensive chronic kidney disease with stage 1 through stage 4 chronic kidney disease, or unspecified chronic kidney disease: Secondary | ICD-10-CM | POA: Diagnosis present

## 2019-01-27 DIAGNOSIS — M2550 Pain in unspecified joint: Secondary | ICD-10-CM | POA: Diagnosis not present

## 2019-01-27 DIAGNOSIS — M4697 Unspecified inflammatory spondylopathy, lumbosacral region: Secondary | ICD-10-CM | POA: Diagnosis present

## 2019-01-27 DIAGNOSIS — L89622 Pressure ulcer of left heel, stage 2: Secondary | ICD-10-CM | POA: Diagnosis not present

## 2019-01-27 DIAGNOSIS — J189 Pneumonia, unspecified organism: Secondary | ICD-10-CM | POA: Diagnosis present

## 2019-01-29 DIAGNOSIS — A419 Sepsis, unspecified organism: Secondary | ICD-10-CM | POA: Diagnosis not present

## 2019-01-29 DIAGNOSIS — R339 Retention of urine, unspecified: Secondary | ICD-10-CM | POA: Diagnosis not present

## 2019-01-29 DIAGNOSIS — R5381 Other malaise: Secondary | ICD-10-CM | POA: Diagnosis not present

## 2019-01-29 DIAGNOSIS — N1330 Unspecified hydronephrosis: Secondary | ICD-10-CM | POA: Diagnosis not present

## 2019-01-30 ENCOUNTER — Encounter: Payer: Medicare Other | Admitting: Endocrinology

## 2019-01-30 ENCOUNTER — Other Ambulatory Visit: Payer: Self-pay

## 2019-01-30 NOTE — Progress Notes (Signed)
This encounter was created in error - please disregard.

## 2019-01-31 DIAGNOSIS — L89622 Pressure ulcer of left heel, stage 2: Secondary | ICD-10-CM | POA: Diagnosis not present

## 2019-01-31 DIAGNOSIS — I10 Essential (primary) hypertension: Secondary | ICD-10-CM | POA: Diagnosis not present

## 2019-01-31 DIAGNOSIS — J188 Other pneumonia, unspecified organism: Secondary | ICD-10-CM | POA: Diagnosis not present

## 2019-01-31 DIAGNOSIS — B3789 Other sites of candidiasis: Secondary | ICD-10-CM | POA: Diagnosis not present

## 2019-01-31 DIAGNOSIS — N1330 Unspecified hydronephrosis: Secondary | ICD-10-CM | POA: Diagnosis not present

## 2019-02-07 ENCOUNTER — Other Ambulatory Visit (HOSPITAL_COMMUNITY): Payer: Self-pay | Admitting: Cardiovascular Disease

## 2019-02-07 ENCOUNTER — Other Ambulatory Visit (HOSPITAL_COMMUNITY): Payer: Self-pay | Admitting: Interventional Cardiology

## 2019-02-07 DIAGNOSIS — I480 Paroxysmal atrial fibrillation: Secondary | ICD-10-CM | POA: Diagnosis not present

## 2019-02-07 DIAGNOSIS — M5416 Radiculopathy, lumbar region: Secondary | ICD-10-CM | POA: Diagnosis not present

## 2019-02-07 DIAGNOSIS — R310 Gross hematuria: Secondary | ICD-10-CM | POA: Diagnosis not present

## 2019-02-07 DIAGNOSIS — I739 Peripheral vascular disease, unspecified: Secondary | ICD-10-CM

## 2019-02-07 DIAGNOSIS — L97819 Non-pressure chronic ulcer of other part of right lower leg with unspecified severity: Secondary | ICD-10-CM | POA: Diagnosis not present

## 2019-02-08 ENCOUNTER — Encounter (HOSPITAL_COMMUNITY): Payer: Medicare Other

## 2019-02-09 DIAGNOSIS — D508 Other iron deficiency anemias: Secondary | ICD-10-CM | POA: Diagnosis not present

## 2019-02-09 DIAGNOSIS — I2510 Atherosclerotic heart disease of native coronary artery without angina pectoris: Secondary | ICD-10-CM | POA: Diagnosis not present

## 2019-02-09 DIAGNOSIS — D72828 Other elevated white blood cell count: Secondary | ICD-10-CM | POA: Diagnosis not present

## 2019-02-12 DIAGNOSIS — N178 Other acute kidney failure: Secondary | ICD-10-CM | POA: Diagnosis not present

## 2019-02-12 DIAGNOSIS — L8990 Pressure ulcer of unspecified site, unspecified stage: Secondary | ICD-10-CM | POA: Diagnosis not present

## 2019-02-12 DIAGNOSIS — N390 Urinary tract infection, site not specified: Secondary | ICD-10-CM | POA: Diagnosis not present

## 2019-02-12 DIAGNOSIS — R6520 Severe sepsis without septic shock: Secondary | ICD-10-CM | POA: Diagnosis not present

## 2019-02-12 DIAGNOSIS — N1330 Unspecified hydronephrosis: Secondary | ICD-10-CM | POA: Diagnosis not present

## 2019-02-12 DIAGNOSIS — N136 Pyonephrosis: Secondary | ICD-10-CM | POA: Diagnosis not present

## 2019-02-12 DIAGNOSIS — T83511A Infection and inflammatory reaction due to indwelling urethral catheter, initial encounter: Secondary | ICD-10-CM | POA: Diagnosis not present

## 2019-02-12 DIAGNOSIS — R1111 Vomiting without nausea: Secondary | ICD-10-CM | POA: Diagnosis not present

## 2019-02-12 DIAGNOSIS — E11649 Type 2 diabetes mellitus with hypoglycemia without coma: Secondary | ICD-10-CM | POA: Diagnosis not present

## 2019-02-12 DIAGNOSIS — E509 Vitamin A deficiency, unspecified: Secondary | ICD-10-CM | POA: Diagnosis not present

## 2019-02-12 DIAGNOSIS — D649 Anemia, unspecified: Secondary | ICD-10-CM | POA: Diagnosis not present

## 2019-02-12 DIAGNOSIS — R509 Fever, unspecified: Secondary | ICD-10-CM | POA: Diagnosis not present

## 2019-02-12 DIAGNOSIS — A419 Sepsis, unspecified organism: Secondary | ICD-10-CM | POA: Diagnosis not present

## 2019-02-12 DIAGNOSIS — N3090 Cystitis, unspecified without hematuria: Secondary | ICD-10-CM | POA: Diagnosis not present

## 2019-02-12 DIAGNOSIS — D62 Acute posthemorrhagic anemia: Secondary | ICD-10-CM | POA: Diagnosis not present

## 2019-02-13 DIAGNOSIS — D62 Acute posthemorrhagic anemia: Secondary | ICD-10-CM | POA: Diagnosis not present

## 2019-02-13 DIAGNOSIS — G9340 Encephalopathy, unspecified: Secondary | ICD-10-CM | POA: Diagnosis present

## 2019-02-13 DIAGNOSIS — Z20828 Contact with and (suspected) exposure to other viral communicable diseases: Secondary | ICD-10-CM | POA: Diagnosis present

## 2019-02-13 DIAGNOSIS — R319 Hematuria, unspecified: Secondary | ICD-10-CM | POA: Diagnosis present

## 2019-02-13 DIAGNOSIS — I2510 Atherosclerotic heart disease of native coronary artery without angina pectoris: Secondary | ICD-10-CM | POA: Diagnosis present

## 2019-02-13 DIAGNOSIS — N390 Urinary tract infection, site not specified: Secondary | ICD-10-CM | POA: Diagnosis not present

## 2019-02-13 DIAGNOSIS — R262 Difficulty in walking, not elsewhere classified: Secondary | ICD-10-CM | POA: Insufficient documentation

## 2019-02-13 DIAGNOSIS — L89152 Pressure ulcer of sacral region, stage 2: Secondary | ICD-10-CM | POA: Diagnosis present

## 2019-02-13 DIAGNOSIS — N183 Chronic kidney disease, stage 3 unspecified: Secondary | ICD-10-CM | POA: Diagnosis not present

## 2019-02-13 DIAGNOSIS — A419 Sepsis, unspecified organism: Secondary | ICD-10-CM | POA: Diagnosis not present

## 2019-02-13 DIAGNOSIS — N131 Hydronephrosis with ureteral stricture, not elsewhere classified: Secondary | ICD-10-CM | POA: Diagnosis present

## 2019-02-13 DIAGNOSIS — N136 Pyonephrosis: Secondary | ICD-10-CM | POA: Diagnosis present

## 2019-02-13 DIAGNOSIS — T83511A Infection and inflammatory reaction due to indwelling urethral catheter, initial encounter: Secondary | ICD-10-CM | POA: Diagnosis present

## 2019-02-13 DIAGNOSIS — E039 Hypothyroidism, unspecified: Secondary | ICD-10-CM | POA: Diagnosis present

## 2019-02-13 DIAGNOSIS — R6520 Severe sepsis without septic shock: Secondary | ICD-10-CM | POA: Diagnosis present

## 2019-02-13 DIAGNOSIS — L89610 Pressure ulcer of right heel, unstageable: Secondary | ICD-10-CM | POA: Diagnosis present

## 2019-02-13 DIAGNOSIS — I498 Other specified cardiac arrhythmias: Secondary | ICD-10-CM | POA: Diagnosis not present

## 2019-02-13 DIAGNOSIS — M4697 Unspecified inflammatory spondylopathy, lumbosacral region: Secondary | ICD-10-CM | POA: Diagnosis present

## 2019-02-13 DIAGNOSIS — Z466 Encounter for fitting and adjustment of urinary device: Secondary | ICD-10-CM | POA: Diagnosis not present

## 2019-02-13 DIAGNOSIS — R1310 Dysphagia, unspecified: Secondary | ICD-10-CM | POA: Diagnosis present

## 2019-02-13 DIAGNOSIS — K219 Gastro-esophageal reflux disease without esophagitis: Secondary | ICD-10-CM | POA: Diagnosis present

## 2019-02-13 DIAGNOSIS — R6510 Systemic inflammatory response syndrome (SIRS) of non-infectious origin without acute organ dysfunction: Secondary | ICD-10-CM | POA: Diagnosis not present

## 2019-02-13 DIAGNOSIS — E8809 Other disorders of plasma-protein metabolism, not elsewhere classified: Secondary | ICD-10-CM | POA: Diagnosis not present

## 2019-02-13 DIAGNOSIS — N1330 Unspecified hydronephrosis: Secondary | ICD-10-CM | POA: Diagnosis not present

## 2019-02-13 DIAGNOSIS — E860 Dehydration: Secondary | ICD-10-CM | POA: Diagnosis present

## 2019-02-13 DIAGNOSIS — R509 Fever, unspecified: Secondary | ICD-10-CM | POA: Diagnosis not present

## 2019-02-13 DIAGNOSIS — E11649 Type 2 diabetes mellitus with hypoglycemia without coma: Secondary | ICD-10-CM | POA: Diagnosis not present

## 2019-02-13 DIAGNOSIS — E1122 Type 2 diabetes mellitus with diabetic chronic kidney disease: Secondary | ICD-10-CM | POA: Diagnosis not present

## 2019-02-13 DIAGNOSIS — I129 Hypertensive chronic kidney disease with stage 1 through stage 4 chronic kidney disease, or unspecified chronic kidney disease: Secondary | ICD-10-CM | POA: Diagnosis present

## 2019-02-13 DIAGNOSIS — Z960 Presence of urogenital implants: Secondary | ICD-10-CM | POA: Diagnosis not present

## 2019-02-13 DIAGNOSIS — N178 Other acute kidney failure: Secondary | ICD-10-CM | POA: Diagnosis present

## 2019-02-13 DIAGNOSIS — R7401 Elevation of levels of liver transaminase levels: Secondary | ICD-10-CM | POA: Diagnosis not present

## 2019-02-13 DIAGNOSIS — E785 Hyperlipidemia, unspecified: Secondary | ICD-10-CM | POA: Diagnosis present

## 2019-02-13 DIAGNOSIS — I255 Ischemic cardiomyopathy: Secondary | ICD-10-CM | POA: Diagnosis present

## 2019-02-13 DIAGNOSIS — L89510 Pressure ulcer of right ankle, unstageable: Secondary | ICD-10-CM | POA: Diagnosis present

## 2019-02-13 DIAGNOSIS — N179 Acute kidney failure, unspecified: Secondary | ICD-10-CM | POA: Diagnosis not present

## 2019-02-13 DIAGNOSIS — E778 Other disorders of glycoprotein metabolism: Secondary | ICD-10-CM | POA: Diagnosis present

## 2019-02-13 DIAGNOSIS — L89626 Pressure-induced deep tissue damage of left heel: Secondary | ICD-10-CM | POA: Diagnosis present

## 2019-02-15 DIAGNOSIS — M4697 Unspecified inflammatory spondylopathy, lumbosacral region: Secondary | ICD-10-CM | POA: Diagnosis present

## 2019-02-15 DIAGNOSIS — I4892 Unspecified atrial flutter: Secondary | ICD-10-CM | POA: Diagnosis not present

## 2019-02-15 DIAGNOSIS — I4891 Unspecified atrial fibrillation: Secondary | ICD-10-CM | POA: Diagnosis present

## 2019-02-15 DIAGNOSIS — N390 Urinary tract infection, site not specified: Secondary | ICD-10-CM | POA: Diagnosis present

## 2019-02-15 DIAGNOSIS — C7951 Secondary malignant neoplasm of bone: Secondary | ICD-10-CM | POA: Diagnosis present

## 2019-02-15 DIAGNOSIS — A419 Sepsis, unspecified organism: Secondary | ICD-10-CM | POA: Diagnosis not present

## 2019-02-15 DIAGNOSIS — I2510 Atherosclerotic heart disease of native coronary artery without angina pectoris: Secondary | ICD-10-CM | POA: Diagnosis present

## 2019-02-15 DIAGNOSIS — T83511D Infection and inflammatory reaction due to indwelling urethral catheter, subsequent encounter: Secondary | ICD-10-CM | POA: Diagnosis not present

## 2019-02-15 DIAGNOSIS — I13 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 1 through stage 4 chronic kidney disease, or unspecified chronic kidney disease: Secondary | ICD-10-CM | POA: Diagnosis present

## 2019-02-15 DIAGNOSIS — Y846 Urinary catheterization as the cause of abnormal reaction of the patient, or of later complication, without mention of misadventure at the time of the procedure: Secondary | ICD-10-CM | POA: Diagnosis present

## 2019-02-15 DIAGNOSIS — Z681 Body mass index (BMI) 19 or less, adult: Secondary | ICD-10-CM | POA: Diagnosis not present

## 2019-02-15 DIAGNOSIS — N131 Hydronephrosis with ureteral stricture, not elsewhere classified: Secondary | ICD-10-CM | POA: Diagnosis present

## 2019-02-15 DIAGNOSIS — N1339 Other hydronephrosis: Secondary | ICD-10-CM | POA: Diagnosis not present

## 2019-02-15 DIAGNOSIS — J9601 Acute respiratory failure with hypoxia: Secondary | ICD-10-CM | POA: Diagnosis not present

## 2019-02-15 DIAGNOSIS — N136 Pyonephrosis: Secondary | ICD-10-CM | POA: Diagnosis present

## 2019-02-15 DIAGNOSIS — Z515 Encounter for palliative care: Secondary | ICD-10-CM | POA: Diagnosis not present

## 2019-02-15 DIAGNOSIS — E872 Acidosis: Secondary | ICD-10-CM | POA: Diagnosis present

## 2019-02-15 DIAGNOSIS — I2583 Coronary atherosclerosis due to lipid rich plaque: Secondary | ICD-10-CM | POA: Diagnosis present

## 2019-02-15 DIAGNOSIS — Z66 Do not resuscitate: Secondary | ICD-10-CM | POA: Diagnosis not present

## 2019-02-15 DIAGNOSIS — E43 Unspecified severe protein-calorie malnutrition: Secondary | ICD-10-CM | POA: Diagnosis present

## 2019-02-15 DIAGNOSIS — R64 Cachexia: Secondary | ICD-10-CM | POA: Diagnosis present

## 2019-02-15 DIAGNOSIS — M8440XA Pathological fracture, unspecified site, initial encounter for fracture: Secondary | ICD-10-CM | POA: Diagnosis not present

## 2019-02-15 DIAGNOSIS — E11649 Type 2 diabetes mellitus with hypoglycemia without coma: Secondary | ICD-10-CM | POA: Diagnosis present

## 2019-02-15 DIAGNOSIS — S32501A Unspecified fracture of right pubis, initial encounter for closed fracture: Secondary | ICD-10-CM | POA: Diagnosis not present

## 2019-02-15 DIAGNOSIS — R5383 Other fatigue: Secondary | ICD-10-CM | POA: Diagnosis not present

## 2019-02-15 DIAGNOSIS — R1310 Dysphagia, unspecified: Secondary | ICD-10-CM | POA: Diagnosis present

## 2019-02-15 DIAGNOSIS — N179 Acute kidney failure, unspecified: Secondary | ICD-10-CM | POA: Diagnosis present

## 2019-02-15 DIAGNOSIS — N1330 Unspecified hydronephrosis: Secondary | ICD-10-CM | POA: Diagnosis not present

## 2019-02-15 DIAGNOSIS — I38 Endocarditis, valve unspecified: Secondary | ICD-10-CM | POA: Diagnosis not present

## 2019-02-15 DIAGNOSIS — I96 Gangrene, not elsewhere classified: Secondary | ICD-10-CM | POA: Diagnosis present

## 2019-02-15 DIAGNOSIS — G9340 Encephalopathy, unspecified: Secondary | ICD-10-CM | POA: Diagnosis present

## 2019-02-15 DIAGNOSIS — Z8249 Family history of ischemic heart disease and other diseases of the circulatory system: Secondary | ICD-10-CM | POA: Diagnosis not present

## 2019-02-15 DIAGNOSIS — I129 Hypertensive chronic kidney disease with stage 1 through stage 4 chronic kidney disease, or unspecified chronic kidney disease: Secondary | ICD-10-CM | POA: Diagnosis present

## 2019-02-15 DIAGNOSIS — J9690 Respiratory failure, unspecified, unspecified whether with hypoxia or hypercapnia: Secondary | ICD-10-CM | POA: Diagnosis not present

## 2019-02-15 DIAGNOSIS — T83511A Infection and inflammatory reaction due to indwelling urethral catheter, initial encounter: Secondary | ICD-10-CM | POA: Diagnosis not present

## 2019-02-15 DIAGNOSIS — Z7189 Other specified counseling: Secondary | ICD-10-CM | POA: Diagnosis not present

## 2019-02-15 DIAGNOSIS — Z9911 Dependence on respirator [ventilator] status: Secondary | ICD-10-CM | POA: Diagnosis not present

## 2019-02-15 DIAGNOSIS — E874 Mixed disorder of acid-base balance: Secondary | ICD-10-CM | POA: Diagnosis not present

## 2019-02-15 DIAGNOSIS — I5042 Chronic combined systolic (congestive) and diastolic (congestive) heart failure: Secondary | ICD-10-CM | POA: Diagnosis present

## 2019-02-15 DIAGNOSIS — M84550A Pathological fracture in neoplastic disease, pelvis, initial encounter for fracture: Secondary | ICD-10-CM | POA: Diagnosis present

## 2019-02-15 DIAGNOSIS — I714 Abdominal aortic aneurysm, without rupture: Secondary | ICD-10-CM | POA: Diagnosis present

## 2019-02-15 DIAGNOSIS — N189 Chronic kidney disease, unspecified: Secondary | ICD-10-CM | POA: Diagnosis not present

## 2019-02-15 DIAGNOSIS — Z20822 Contact with and (suspected) exposure to covid-19: Secondary | ICD-10-CM | POA: Diagnosis present

## 2019-02-15 DIAGNOSIS — I255 Ischemic cardiomyopathy: Secondary | ICD-10-CM | POA: Diagnosis present

## 2019-02-19 ENCOUNTER — Telehealth: Payer: Medicare Other | Admitting: Family Medicine

## 2019-02-19 ENCOUNTER — Encounter: Payer: Self-pay | Admitting: Family Medicine

## 2019-02-19 NOTE — Progress Notes (Signed)
Noted that she received and filled Rx for Percocet on 11/12/18 by Dr Wilson Singer. Hydrocodone Rx given 01/18/19 ,not filled yet, [name].  Today we were supposed to follow on pain management but according to [name], another provider is now managing her chronic pain. Appt cancelled. She will contact the office if a new concern arises.  [name], MD

## 2019-03-06 ENCOUNTER — Inpatient Hospital Stay (HOSPITAL_COMMUNITY): Admission: RE | Admit: 2019-03-06 | Payer: Medicare Other | Source: Ambulatory Visit

## 2019-03-06 ENCOUNTER — Other Ambulatory Visit: Payer: Self-pay

## 2019-03-06 ENCOUNTER — Other Ambulatory Visit (HOSPITAL_COMMUNITY): Payer: Self-pay | Admitting: Cardiovascular Disease

## 2019-03-06 ENCOUNTER — Ambulatory Visit (HOSPITAL_COMMUNITY)
Admission: RE | Admit: 2019-03-06 | Payer: Medicare Other | Source: Ambulatory Visit | Attending: Cardiovascular Disease | Admitting: Cardiovascular Disease

## 2019-03-06 ENCOUNTER — Inpatient Hospital Stay (HOSPITAL_COMMUNITY)
Admission: EM | Admit: 2019-03-06 | Discharge: 2019-04-09 | DRG: 698 | Disposition: E | Payer: Medicare Other | Attending: Pulmonary Disease | Admitting: Pulmonary Disease

## 2019-03-06 ENCOUNTER — Encounter (HOSPITAL_COMMUNITY): Payer: Self-pay | Admitting: Emergency Medicine

## 2019-03-06 ENCOUNTER — Encounter (HOSPITAL_COMMUNITY): Payer: Self-pay

## 2019-03-06 ENCOUNTER — Emergency Department (HOSPITAL_COMMUNITY): Payer: Medicare Other

## 2019-03-06 DIAGNOSIS — N136 Pyonephrosis: Secondary | ICD-10-CM | POA: Diagnosis present

## 2019-03-06 DIAGNOSIS — E1151 Type 2 diabetes mellitus with diabetic peripheral angiopathy without gangrene: Secondary | ICD-10-CM | POA: Diagnosis present

## 2019-03-06 DIAGNOSIS — N1339 Other hydronephrosis: Secondary | ICD-10-CM | POA: Diagnosis not present

## 2019-03-06 DIAGNOSIS — J9601 Acute respiratory failure with hypoxia: Secondary | ICD-10-CM | POA: Diagnosis not present

## 2019-03-06 DIAGNOSIS — C7951 Secondary malignant neoplasm of bone: Secondary | ICD-10-CM | POA: Diagnosis present

## 2019-03-06 DIAGNOSIS — I712 Thoracic aortic aneurysm, without rupture: Secondary | ICD-10-CM | POA: Diagnosis not present

## 2019-03-06 DIAGNOSIS — L89629 Pressure ulcer of left heel, unspecified stage: Secondary | ICD-10-CM | POA: Diagnosis present

## 2019-03-06 DIAGNOSIS — N179 Acute kidney failure, unspecified: Secondary | ICD-10-CM | POA: Diagnosis present

## 2019-03-06 DIAGNOSIS — Z452 Encounter for adjustment and management of vascular access device: Secondary | ICD-10-CM

## 2019-03-06 DIAGNOSIS — E872 Acidosis: Secondary | ICD-10-CM | POA: Diagnosis present

## 2019-03-06 DIAGNOSIS — N189 Chronic kidney disease, unspecified: Secondary | ICD-10-CM | POA: Diagnosis not present

## 2019-03-06 DIAGNOSIS — I5042 Chronic combined systolic (congestive) and diastolic (congestive) heart failure: Secondary | ICD-10-CM | POA: Diagnosis present

## 2019-03-06 DIAGNOSIS — N390 Urinary tract infection, site not specified: Secondary | ICD-10-CM

## 2019-03-06 DIAGNOSIS — Y846 Urinary catheterization as the cause of abnormal reaction of the patient, or of later complication, without mention of misadventure at the time of the procedure: Secondary | ICD-10-CM | POA: Diagnosis present

## 2019-03-06 DIAGNOSIS — Z66 Do not resuscitate: Secondary | ICD-10-CM | POA: Diagnosis not present

## 2019-03-06 DIAGNOSIS — Z8249 Family history of ischemic heart disease and other diseases of the circulatory system: Secondary | ICD-10-CM | POA: Diagnosis not present

## 2019-03-06 DIAGNOSIS — Z20822 Contact with and (suspected) exposure to covid-19: Secondary | ICD-10-CM | POA: Diagnosis present

## 2019-03-06 DIAGNOSIS — T83511D Infection and inflammatory reaction due to indwelling urethral catheter, subsequent encounter: Secondary | ICD-10-CM | POA: Diagnosis not present

## 2019-03-06 DIAGNOSIS — I96 Gangrene, not elsewhere classified: Secondary | ICD-10-CM | POA: Diagnosis present

## 2019-03-06 DIAGNOSIS — J9690 Respiratory failure, unspecified, unspecified whether with hypoxia or hypercapnia: Secondary | ICD-10-CM

## 2019-03-06 DIAGNOSIS — I13 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 1 through stage 4 chronic kidney disease, or unspecified chronic kidney disease: Secondary | ICD-10-CM | POA: Diagnosis present

## 2019-03-06 DIAGNOSIS — Z823 Family history of stroke: Secondary | ICD-10-CM

## 2019-03-06 DIAGNOSIS — L89620 Pressure ulcer of left heel, unstageable: Secondary | ICD-10-CM | POA: Diagnosis present

## 2019-03-06 DIAGNOSIS — E1122 Type 2 diabetes mellitus with diabetic chronic kidney disease: Secondary | ICD-10-CM | POA: Diagnosis present

## 2019-03-06 DIAGNOSIS — E039 Hypothyroidism, unspecified: Secondary | ICD-10-CM | POA: Diagnosis present

## 2019-03-06 DIAGNOSIS — Z9911 Dependence on respirator [ventilator] status: Secondary | ICD-10-CM

## 2019-03-06 DIAGNOSIS — E43 Unspecified severe protein-calorie malnutrition: Secondary | ICD-10-CM | POA: Diagnosis present

## 2019-03-06 DIAGNOSIS — N183 Chronic kidney disease, stage 3 unspecified: Secondary | ICD-10-CM | POA: Diagnosis present

## 2019-03-06 DIAGNOSIS — Z515 Encounter for palliative care: Secondary | ICD-10-CM | POA: Diagnosis not present

## 2019-03-06 DIAGNOSIS — E874 Mixed disorder of acid-base balance: Secondary | ICD-10-CM | POA: Diagnosis not present

## 2019-03-06 DIAGNOSIS — T83511A Infection and inflammatory reaction due to indwelling urethral catheter, initial encounter: Principal | ICD-10-CM | POA: Diagnosis present

## 2019-03-06 DIAGNOSIS — I2583 Coronary atherosclerosis due to lipid rich plaque: Secondary | ICD-10-CM | POA: Diagnosis present

## 2019-03-06 DIAGNOSIS — Z681 Body mass index (BMI) 19 or less, adult: Secondary | ICD-10-CM | POA: Diagnosis not present

## 2019-03-06 DIAGNOSIS — Z833 Family history of diabetes mellitus: Secondary | ICD-10-CM

## 2019-03-06 DIAGNOSIS — D631 Anemia in chronic kidney disease: Secondary | ICD-10-CM | POA: Diagnosis present

## 2019-03-06 DIAGNOSIS — Z79890 Hormone replacement therapy (postmenopausal): Secondary | ICD-10-CM

## 2019-03-06 DIAGNOSIS — I714 Abdominal aortic aneurysm, without rupture: Secondary | ICD-10-CM | POA: Diagnosis present

## 2019-03-06 DIAGNOSIS — A419 Sepsis, unspecified organism: Secondary | ICD-10-CM

## 2019-03-06 DIAGNOSIS — I4891 Unspecified atrial fibrillation: Secondary | ICD-10-CM | POA: Diagnosis present

## 2019-03-06 DIAGNOSIS — Z4659 Encounter for fitting and adjustment of other gastrointestinal appliance and device: Secondary | ICD-10-CM

## 2019-03-06 DIAGNOSIS — I4892 Unspecified atrial flutter: Secondary | ICD-10-CM | POA: Diagnosis not present

## 2019-03-06 DIAGNOSIS — R5383 Other fatigue: Secondary | ICD-10-CM | POA: Diagnosis not present

## 2019-03-06 DIAGNOSIS — R64 Cachexia: Secondary | ICD-10-CM | POA: Diagnosis present

## 2019-03-06 DIAGNOSIS — I2510 Atherosclerotic heart disease of native coronary artery without angina pectoris: Secondary | ICD-10-CM | POA: Diagnosis present

## 2019-03-06 DIAGNOSIS — K219 Gastro-esophageal reflux disease without esophagitis: Secondary | ICD-10-CM | POA: Diagnosis present

## 2019-03-06 DIAGNOSIS — I38 Endocarditis, valve unspecified: Secondary | ICD-10-CM | POA: Diagnosis not present

## 2019-03-06 DIAGNOSIS — S32591A Other specified fracture of right pubis, initial encounter for closed fracture: Secondary | ICD-10-CM

## 2019-03-06 DIAGNOSIS — K5730 Diverticulosis of large intestine without perforation or abscess without bleeding: Secondary | ICD-10-CM | POA: Diagnosis present

## 2019-03-06 DIAGNOSIS — Z7982 Long term (current) use of aspirin: Secondary | ICD-10-CM

## 2019-03-06 DIAGNOSIS — E860 Dehydration: Secondary | ICD-10-CM | POA: Diagnosis present

## 2019-03-06 DIAGNOSIS — L8990 Pressure ulcer of unspecified site, unspecified stage: Secondary | ICD-10-CM | POA: Insufficient documentation

## 2019-03-06 DIAGNOSIS — L89150 Pressure ulcer of sacral region, unstageable: Secondary | ICD-10-CM | POA: Diagnosis present

## 2019-03-06 DIAGNOSIS — Z951 Presence of aortocoronary bypass graft: Secondary | ICD-10-CM

## 2019-03-06 DIAGNOSIS — D499 Neoplasm of unspecified behavior of unspecified site: Secondary | ICD-10-CM | POA: Diagnosis present

## 2019-03-06 DIAGNOSIS — S32501A Unspecified fracture of right pubis, initial encounter for closed fracture: Secondary | ICD-10-CM | POA: Diagnosis not present

## 2019-03-06 DIAGNOSIS — M84550A Pathological fracture in neoplastic disease, pelvis, initial encounter for fracture: Secondary | ICD-10-CM | POA: Diagnosis not present

## 2019-03-06 DIAGNOSIS — M8440XA Pathological fracture, unspecified site, initial encounter for fracture: Secondary | ICD-10-CM | POA: Diagnosis not present

## 2019-03-06 DIAGNOSIS — Z87891 Personal history of nicotine dependence: Secondary | ICD-10-CM

## 2019-03-06 DIAGNOSIS — G894 Chronic pain syndrome: Secondary | ICD-10-CM | POA: Diagnosis present

## 2019-03-06 DIAGNOSIS — Z7189 Other specified counseling: Secondary | ICD-10-CM | POA: Diagnosis not present

## 2019-03-06 DIAGNOSIS — N1330 Unspecified hydronephrosis: Secondary | ICD-10-CM

## 2019-03-06 DIAGNOSIS — Z4682 Encounter for fitting and adjustment of non-vascular catheter: Secondary | ICD-10-CM | POA: Diagnosis not present

## 2019-03-06 DIAGNOSIS — Z794 Long term (current) use of insulin: Secondary | ICD-10-CM

## 2019-03-06 DIAGNOSIS — I739 Peripheral vascular disease, unspecified: Secondary | ICD-10-CM

## 2019-03-06 DIAGNOSIS — I469 Cardiac arrest, cause unspecified: Secondary | ICD-10-CM | POA: Diagnosis not present

## 2019-03-06 DIAGNOSIS — L89610 Pressure ulcer of right heel, unstageable: Secondary | ICD-10-CM | POA: Diagnosis present

## 2019-03-06 DIAGNOSIS — I255 Ischemic cardiomyopathy: Secondary | ICD-10-CM | POA: Diagnosis present

## 2019-03-06 DIAGNOSIS — Z803 Family history of malignant neoplasm of breast: Secondary | ICD-10-CM

## 2019-03-06 DIAGNOSIS — Z7902 Long term (current) use of antithrombotics/antiplatelets: Secondary | ICD-10-CM

## 2019-03-06 DIAGNOSIS — E785 Hyperlipidemia, unspecified: Secondary | ICD-10-CM | POA: Diagnosis present

## 2019-03-06 DIAGNOSIS — L89890 Pressure ulcer of other site, unstageable: Secondary | ICD-10-CM | POA: Diagnosis not present

## 2019-03-06 DIAGNOSIS — E11649 Type 2 diabetes mellitus with hypoglycemia without coma: Secondary | ICD-10-CM | POA: Diagnosis not present

## 2019-03-06 LAB — CBC WITH DIFFERENTIAL/PLATELET
Abs Immature Granulocytes: 0.18 10*3/uL — ABNORMAL HIGH (ref 0.00–0.07)
Basophils Absolute: 0.1 10*3/uL (ref 0.0–0.1)
Basophils Relative: 0 %
Eosinophils Absolute: 0 10*3/uL (ref 0.0–0.5)
Eosinophils Relative: 0 %
HCT: 29.5 % — ABNORMAL LOW (ref 36.0–46.0)
Hemoglobin: 8.6 g/dL — ABNORMAL LOW (ref 12.0–15.0)
Immature Granulocytes: 1 %
Lymphocytes Relative: 4 %
Lymphs Abs: 0.7 10*3/uL (ref 0.7–4.0)
MCH: 30.2 pg (ref 26.0–34.0)
MCHC: 29.2 g/dL — ABNORMAL LOW (ref 30.0–36.0)
MCV: 103.5 fL — ABNORMAL HIGH (ref 80.0–100.0)
Monocytes Absolute: 0.9 10*3/uL (ref 0.1–1.0)
Monocytes Relative: 5 %
Neutro Abs: 17.5 10*3/uL — ABNORMAL HIGH (ref 1.7–7.7)
Neutrophils Relative %: 90 %
Platelets: 502 10*3/uL — ABNORMAL HIGH (ref 150–400)
RBC: 2.85 MIL/uL — ABNORMAL LOW (ref 3.87–5.11)
RDW: 16.7 % — ABNORMAL HIGH (ref 11.5–15.5)
WBC: 19.4 10*3/uL — ABNORMAL HIGH (ref 4.0–10.5)
nRBC: 0 % (ref 0.0–0.2)

## 2019-03-06 LAB — LACTIC ACID, PLASMA
Lactic Acid, Venous: 2.2 mmol/L (ref 0.5–1.9)
Lactic Acid, Venous: 2 mmol/L (ref 0.5–1.9)

## 2019-03-06 LAB — PROTIME-INR
INR: 1.1 (ref 0.8–1.2)
Prothrombin Time: 14.2 seconds (ref 11.4–15.2)

## 2019-03-06 LAB — BASIC METABOLIC PANEL
Anion gap: 12 (ref 5–15)
BUN: 75 mg/dL — ABNORMAL HIGH (ref 8–23)
CO2: 25 mmol/L (ref 22–32)
Calcium: 8.5 mg/dL — ABNORMAL LOW (ref 8.9–10.3)
Chloride: 103 mmol/L (ref 98–111)
Creatinine, Ser: 1.72 mg/dL — ABNORMAL HIGH (ref 0.44–1.00)
GFR calc Af Amer: 33 mL/min — ABNORMAL LOW (ref >60)
GFR calc non Af Amer: 28 mL/min — ABNORMAL LOW (ref >60)
Glucose, Bld: 116 mg/dL — ABNORMAL HIGH (ref 70–99)
Potassium: 4.2 mmol/L (ref 3.5–5.1)
Sodium: 140 mmol/L (ref 135–145)

## 2019-03-06 LAB — APTT: aPTT: 39 seconds — ABNORMAL HIGH (ref 24–36)

## 2019-03-06 LAB — URINALYSIS, ROUTINE W REFLEX MICROSCOPIC
Bilirubin Urine: NEGATIVE
Glucose, UA: NEGATIVE mg/dL
Ketones, ur: NEGATIVE mg/dL
Nitrite: NEGATIVE
Protein, ur: 100 mg/dL — AB
RBC / HPF: >50 RBC/hpf — ABNORMAL HIGH (ref 0–5)
Specific Gravity, Urine: 1.014 (ref 1.005–1.030)
Squamous Epithelial / HPF: >50 — ABNORMAL HIGH (ref 0–5)
WBC, UA: >50 WBC/hpf — ABNORMAL HIGH (ref 0–5)
pH: 5 (ref 5.0–8.0)

## 2019-03-06 LAB — POC OCCULT BLOOD, ED: Fecal Occult Bld: NEGATIVE

## 2019-03-06 LAB — CBG MONITORING, ED: Glucose-Capillary: 100 mg/dL — ABNORMAL HIGH (ref 70–99)

## 2019-03-06 MED ORDER — INSULIN ASPART 100 UNIT/ML ~~LOC~~ SOLN
0.0000 [IU] | SUBCUTANEOUS | Status: DC
  Filled 2019-03-06: qty 0.09

## 2019-03-06 MED ORDER — SODIUM CHLORIDE 0.9 % IV BOLUS
500.0000 mL | Freq: Once | INTRAVENOUS | Status: AC
  Administered 2019-03-06: 500 mL via INTRAVENOUS

## 2019-03-06 MED ORDER — SODIUM CHLORIDE 0.9 % IV SOLN: INTRAVENOUS | Status: DC

## 2019-03-06 MED ORDER — METRONIDAZOLE IN NACL 5-0.79 MG/ML-% IV SOLN
500.0000 mg | Freq: Once | INTRAVENOUS | Status: AC
  Administered 2019-03-06: 500 mg via INTRAVENOUS
  Filled 2019-03-06: qty 100

## 2019-03-06 MED ORDER — FENTANYL CITRATE (PF) 100 MCG/2ML IJ SOLN
50.0000 ug | Freq: Once | INTRAMUSCULAR | Status: AC
  Administered 2019-03-06: 50 ug via INTRAVENOUS
  Filled 2019-03-06: qty 2

## 2019-03-06 MED ORDER — ACETAMINOPHEN 325 MG PO TABS: 650.0000 mg | ORAL_TABLET | Freq: Four times a day (QID) | ORAL | Status: DC | PRN

## 2019-03-06 MED ORDER — SODIUM CHLORIDE 0.9 % IV SOLN
2.0000 g | INTRAVENOUS | Status: DC
  Administered 2019-03-07 – 2019-03-10 (×4): 2 g via INTRAVENOUS
  Filled 2019-03-06 (×2): qty 20
  Filled 2019-03-06 (×2): qty 2
  Filled 2019-03-06: qty 20

## 2019-03-06 MED ORDER — VANCOMYCIN HCL IN DEXTROSE 1-5 GM/200ML-% IV SOLN
1000.0000 mg | Freq: Once | INTRAVENOUS | Status: AC
  Administered 2019-03-06: 1000 mg via INTRAVENOUS
  Filled 2019-03-06: qty 200

## 2019-03-06 MED ORDER — SODIUM CHLORIDE 0.9 % IV SOLN
2.0000 g | Freq: Once | INTRAVENOUS | Status: AC
  Administered 2019-03-06: 2 g via INTRAVENOUS
  Filled 2019-03-06: qty 2

## 2019-03-06 MED ORDER — ONDANSETRON HCL 4 MG/2ML IJ SOLN: 4.0000 mg | Freq: Four times a day (QID) | INTRAMUSCULAR | Status: DC | PRN

## 2019-03-06 MED ORDER — ACETAMINOPHEN 650 MG RE SUPP: 650.0000 mg | Freq: Four times a day (QID) | RECTAL | Status: DC | PRN

## 2019-03-06 NOTE — Progress Notes (Signed)
A consult was received from an ED physician for Vancomycin & Cefepime per pharmacy dosing.  The patient's profile has been reviewed for ht/wt/allergies/indication/available labs.    A one time order has been placed for Vancomycin 1gm & Cefepime 2gm by ED MD, agree with doses.  Further antibiotics/pharmacy consults should be ordered by admitting physician if indicated.                       Thank you,  Minda Ditto PharmD 03/04/2019  7:07 PM

## 2019-03-06 NOTE — Consult Note (Signed)
This patient is being admitted through the ED from my clinic today.  My clinic note follows.  I will check on her tomorrow.  Thank you for helping care for this very nice patient.  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 Her right ureter had been managed with stent exchanges, the last 1 being in September. She has not done well since the last stent exchange having had numerous recurrent urinary tract infections. She has been not emptying her bladder very well for a long period of time,and this seems to have finally caught up with her. At this point she now has a Foley catheter to minimize her risk of developing recurring infections and thus UTI sepsis.   Since being discharged from the hospital 2 weeks ago, she has been in the nursing home and really has not improved. She has declined over the past week. 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 She also has developed right-sided hydronephrosis, which I do not think was present on her most recent hospitalization based on the notes from [facility]. Her ultrasound in our office today definitely showed some hydronephrosis which when compared to May of 2020 is new.   The patient is also profoundly vasculopathic. She was found to be anemic at her most recent hospitalization and was transfused 2 units of red blood cells. 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   I sent the patient to the emergency department for admission, I think she would benefit from having a nephrostomy tube placed in her right kidney given that her right stent has failed. I also think that  the patient should have and a suprapubic catheter placed and her Foley catheter removed. This should give her urethra a chance to heal. I do not think that the patient currently has a urinary tract infection, but she would likely benefit from local wound care to her feet, her sacral decubitus, and her labia/urethra.   I recommend to keep the patient NPO past midnight, in consult interventional Radiology in the morning. I will follow along with you.   Ureteral Stricture  HPI: Atalia Litzinger is a 78 year-old female established patient who is here for further eval and management of a ureteral stricture.  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 The left side was initially obstructed and managed with a stent. Differential function demonstrated little function and stent was removed. Right hydro developed in Fall 2016. Now being managed with ureteral stents, not a good surgical candidate otherwise.  12/18: She states being hospitalized for dehydration and urinary tract infection over the Thanksgiving holiday.   04/13/18: Patient had a recent knee injury and is currently at facility.   10/30/2018: The patient presents today for follow-up. She was recently seen and treated for an E coli urinary tract infection. She has had 2 infections over the last 6 months. She is due to have her stent exchanged. She the patient states that she has had persistent frequency, urgency, bladder pressure. She is getting up more at night. She denies any fevers, but does state that she  has had some chills. She denies any altered mental status or dizziness.   12/15/2018: She underwent ureteral stent exchange on 09/25 with Dr Louis Meckel. Seen in the ED on 10/06 for worsening pain and burning urination. She was placed on Keflex empirically for possible UTI but urine c/s was negative. She f/u today with continued symptoms. At last visit prior to stent exchange she was given myrbetriq to help with increased OAB symptoms as  well as pyridium to help with burning. Her last positive urine c/s assessed on 09/20 was MDR Klebsiella.   Pt's primary complaint is left hip and sacral pain. This is made worse with prolonged sitting, position changes, and walking. She is not have pain over the left CVA area. Pt reports not having a BM in 6 days. She is using colace and doculax. Pain is only temporarily relieved with use of percocet. Per daughter she is drinking and eating poorly.   Voiding symptoms today are actually improved from her baseline. She's only reporting minimal burning at this time. Urinary frequency/urgency are stable. She denies visible blood in the urine. She has not had fevers, n/v. Her daughter and pt. today are very frustrated with the current situation, they had a poor experience earlier this week in the ED.   03/03/2019: Ms. Melfi presents today for follow up for her right ureteral stent. She was hospitalized with sepsis on 02/12/19-02/15/19 at [facility]. She was discharged from [facility] and sent to [facility], where she has been living. Per facility, Ms. Fennema has increased weakness and confusion over the past week, it worsened over the weekend and she began yelling out in pain. At this visit she is tearful and crying in pain. She reports pain in her vagina, lower abdomen and right side. Her daughter is concerned and accompanies her to this appointment. She has not been able to visit with her due to the pandemic. She appears frail and pale. Per facility she has not had any fevers, diarrhea or falls. She has had some nausea.     ALLERGIES: None    MEDICATIONS: Crestor 20 mg tablet  Levothyroxine Sodium 125 mcg tablet  Phenazopyridine Hcl 200 mg tablet  Plavix  Tamsulosin Hcl 0.4 mg capsule 1 capsule PO Daily  Acetaminophen 325 mg tablet  Albuterol Sulfate 2.5 mg/3 ml (0.083 %) vial, nebulizer Inhalation  Aspirin Ec 81 mg tablet, delayed release  Atorvastatin Calcium 80 mg tablet  Calcium + D   Clopidogrel 75 mg tablet  Fenofibrate 54 mg tablet  Ferrous Sulfate 325 mg (65 mg iron) tablet  Ferrous Sulfate 325 (65 Fe) MG Oral Tablet Delayed Release Oral  Furosemide 20 mg tablet  Hydrocodone-Acetaminophen 5 mg-325 mg tablet  Imodium A-D 2 mg capsule  Isosorbide Mononitrate Er 30 mg tablet, extended release 24 hr  Lasix 20 mg tablet  Latanoprost 0.005 % drops  Levemir FlexPen 100 UNIT/ML SOLN Subcutaneous  Lidoderm 5 % adhesive patch, medicated  Metoprolol Succinate 25 mg tablet, extended release 24 hr Oral  Miralax  Novolog Flexpen 100 unit/ml (3 ml) insulin pen Subcutaneous  Omeprazole 20 mg capsule,delayed release Oral  Rosuvastatin Calcium 20 mg tablet  Sodium Bicarbonate 650 mg tablet  Tylenol Arthritis 650 mg tablet, extended release  Ventolin Hfa 90 mcg hfa aerosol with adapter  Vitamin B-12 TABS Oral  Vitamin C  Vitamin D2 1,250 mcg (50,000 unit) capsule Oral     GU PSH: Cystoscopy Insert Stent, Right - 12/01/2018, Right - 05/26/2018, Right - 10/06/2017, Right -  2019, Right - 2018, Right - 2017, 2017, 2017, 2014, 2014 Hysterectomy Unilat SO - 2014       [facility] Notes: Cystoscopy With Insertion Of Ureteral Stent Right, Cystoscopy With Insertion Of Ureteral Stent Right, Cesarean Section, Hysterectomy, Coronary Artery Single Venous Bypass Graft, Cystoscopy With Insertion Of Ureteral Stent, Cystoscopy With Insertion Of Ureteral Stent Left   NON-GU PSH: CABG (coronary artery bypass grafting) - 2014 Cesarean Delivery Only - 2014 Cholecystectomy (open), 09/2015     GU PMH: Ureteral stricture - 12/15/2018, - 10/30/2018, - 10/20/2018, - 10/11/2018, - 07/20/2018, - 07/14/2018, - 04/13/2018, - 01/19/2018, - 2019, Right, - 2018, Right, 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 This is being managed withchronic indwelling stents. She is due to have her stent changed soon, her last stent was exchanged 6 months prior. 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 She had a stent placed in the spring of 2015 followed by renogram demonstrating 18% function on the left kidney. Her stent was subsequently removed.  Recently she developed bilateral hydronephrosis, renogram demonstrated bilateral obstruction with 85% right function and 15% left sided function a significant change from her previous ultrasounds and renograms. In Jan 2015 she had no hydro or evidence of right sided obstruction.   02/21/15 bilateral RPGs- left side showed a normal distal ureter to the pelvic brim where it stopped abruptly and I was unable to opacify the proximal portion. On the right side showed a normal distal ureter with abrupt narrowing at the pelvic brim and proximal dilation. A right sided stent was placed.   She has a history of recurrent infections. In 2015 she had 4 Escherichia coli infections.   03/2015: treated for UTI - keflex - no growth. recommdended lactobacillus, as well as cranberry tablets twice daily   2/17: BUN/Cr- 30/1.78    NON-GU PMH: Other constipation - 12/15/2018 Pyuria/other UA findings - 07/14/2018 Encounter for general adult medical examination without abnormal findings, Encounter for preventive health examination - 2016 Personal history of other diseases of the circulatory system, History of cardiac disorder - 2014, History of hypertension, - 2014 Personal history of other endocrine, nutritional and metabolic disease, History of diabetes mellitus - 2014, History of hypothyroidism, - 2014, History of hypercholesterolemia, -  2014 Atherosclerotic heart disease of native coronary artery without angina pectoris GERD History of falling Hyperlipidemia, unspecified 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 1 through stage 4 chronic kidney disease, or unspecified chronic kidney disease Hypothyroidism Ischemic cardiomyopathy Occlusion and stenosis of unspecified carotid 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 Smoked for 30 years.   Tobacco Use Assessment Completed: Used Tobacco in last 30 days? Social Drinker.  Drinks 1 caffeinated drink per day.     Notes: Former smoker, Tobacco use, Marital History - Single, Retired From Work, Alcohol Use, Caffeine Use   REVIEW OF SYSTEMS:    GU Review Female:   vaginal pain. Patient denies frequent urination, hard to postpone urination, burning /pain with urination, get up at night to urinate, leakage of urine, stream starts and stops, trouble starting your stream, have to strain to urinate, and being pregnant.  Gastrointestinal (Upper):   Patient reports nausea. Patient denies vomiting and indigestion/ heartburn.  Gastrointestinal (Lower):   Patient denies diarrhea and constipation.  Constitutional:   Patient reports weight loss and fatigue. Patient  denies fever and night sweats.  Skin:   Patient denies skin rash/ lesion and itching.  Eyes:   Patient denies blurred vision and double vision.  Ears/ Nose/ Throat:   Patient denies sore throat and sinus problems.  Hematologic/Lymphatic:   Patient denies swollen glands and easy bruising.  Cardiovascular:   Patient denies leg swelling and chest pains.  Respiratory:   Patient reports shortness of breath. Patient denies cough.  Endocrine:   Patient denies excessive thirst.  Musculoskeletal:   Patient reports back pain. Patient denies joint pain.  Neurological:   Patient reports dizziness. Patient denies headaches.  Psychologic:   Patient denies depression and anxiety.   VITAL SIGNS:      02/17/2019 02:01 PM  Weight 121 lb / 54.88 kg  Height 66 in / 167.64 cm  BP 116/72 mmHg  Pulse 73 /min  Temperature 97.3 F / 36.2 C  BMI 19.5 kg/m   GU PHYSICAL EXAMINATION:    External Genitalia: Labial rash, atrophic introitus. Mild labial edema . No hirsuitism, no scarring, no cyst, no erythematous lesion, no papular lesion, no blanched lesion, no warty lesion, no labial adhesions.   Urethral Meatus: Urethral atrophy, serous sanguinous discharge discharge, necrosis   Urethra: Tender urethra. Catheter in place.   Bladder: Tender to palpation   MULTI-SYSTEM PHYSICAL EXAMINATION:    Constitutional: Poorly-nourished. Poor dentition. No physical deformities. Normally developed.   Respiratory: Normal breath sounds. No labored breathing, no use of accessory muscles.   Cardiovascular: Regular rate and rhythm. No murmur, no gallop. Warm extremities. Normal extremity pulses, no swelling, no varicosities.   Skin: Skin has decreased turgor, poor hydration, pale, multiple bruises No jaundice, no cyanosis. No lesion, no ulcer, no rash.   Gastrointestinal: Abdominal tenderness. No mass, no rigidity, non obese abdomen.   Eyes: Conjuntivae pale . Normal eyelids.      PAST DATA REVIEWED:  Source Of History:   Patient, Acupuncturist, Outside Source, Family/Caregiver  Lab Test Review:   CMP  Records Review:   Previous Doctor Records, Previous Hospital Records, Previous Patient Records  Urine Test Review:   Urinalysis  X-Ray Review: KUB: Reviewed Films. Reviewed Report. Discussed With Patient.  Renal Ultrasound: Reviewed Films. Reviewed Report. Discussed With Patient. Discussed with patient's daughter.    Notes:                     called and spoke with nurse, Lavell Luster at facility regarding patient and how she has been doing.    PROCEDURES:         KUB - [id number]  A single view of the abdomen is obtained. Renal shadows are  easily visualized bilaterally. There are no stones appreciated within the expected location in either renal pelvis. There are no additional calcifications along the expected location of either ureter bilaterally.  Gas pattern is grossly normal. No significant bony abnormalities.      Impression: The stent appears to be in good position on today's x-ray.           Renal Ultrasound - [id number]  Right kidney  Length: 8.9 cm Depth: 6.2 cm Cortical Width: .7 cm Width: 5.6 cm  Left Kidney Length: 13.5 cm Depth:5.9 cm Cortical Width: cm Width:6.2 cm   Left Kidney/Ureter:  Massive hydro w massive hydroureter which seems to narrow distally- loss of normal parenchyma noted  Right Kidney/Ureter:  Decreased size of kidney w mild/mod hydro noted. Stent is noted in collecting system  Bladder:  Foley balloon is seen in decompressed bladder w ? thickened bladder wall vs debris      Impression: The patient has worsening right-sided hydro nephrosis. The stented seen and appears to be in good position. The patient's left kidney is unchanged.         Simple Foley Indwelling Cath Change - [id number]  The patient's indwelling foley tube was carefully removed. A 16 coude Foley catheter was inserted into the bladder using sterile technique. The patient was taught routine catheter care. Hand  irrigation of the bladder with sterile water was performed. A bedside bag was connected.         Ketoralac 30mg  - [id number], [id number] 0.5 ML GIVEN. Patient in wheelchair unable to stand. Hip lifted in chair to give clearance to inject   Qty: 15 Adm. By: Cristobal Goldmann  Unit: mg Lot No [id number]  Route: IM Exp. Date 05/06/2020  Freq: None Mfgr.:   Site: Right Buttock

## 2019-03-06 NOTE — ED Provider Notes (Signed)
[place] DEPT Provider Note   CSN: [id number] Arrival date & time: 02/13/2019  1615     History Chief Complaint  Patient presents with  . Back Pain  . Abdominal Pain    [name] [name] is a 78 y.o. female.  Patient is a 78 year old female who presents with lower abdominal/vaginal pain.  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  Her right kidney is functional but has some degree of chronic hydronephrosis.  Dr. Louis Meckel is following this and changes out her ureteral stent about every 6 months.  Was last changed in September.  She was recently admitted to [facility] at the beginning of December for urosepsis although urine and blood cultures were negative.  It was thought to be false negative as she had already received a dose of antibiotics prior to the culture collections.  She was seen today at Dr. Carlton Adam office and he noticed a big decline over the last few weeks.  She has been having recurrent UTIs since the last stent.  She also was noted today to have some necrosis to her vaginal area and urethra where the Foley catheter has been placed.  She is required a chronic indwelling Foley catheter over the last several weeks.  She is also had a functional decline and that she is not as active as she has been and has chronic pain to her lower abdomen and vaginal area.  He sent her here for further evaluation and admission for possible urostomy tube placement.  Patient denies any recent fevers.  She does state that she has had some nausea and vomiting over the last 2 to 3 days.  She currently is residing in a rehab facility.        Past Medical History:  Diagnosis Date  . A-fib ([place])   . AAA (abdominal aortic aneurysm) ([place])   . Bilateral hydronephrosis   . Carotid artery stenosis    carotid doppler 06/2012 - Right  CCA/Bulb/ICA with chronic occlusion; L vertebral artery with abnormal blood flow; L Bulb/Prox ICA  s/p endarterectomy with mild fibrous plaque, 50% diameter reduction  . CHF (congestive heart failure) ([place])   . Choledocholithiasis 2017  . CKD (chronic kidney disease), stage III   . Coronary artery disease due to lipid rich plaque cardiologist-  dr berry   s/p CABG x6 1997-- cath 12-09-2009 occluded vein to obtuse marginal branch and ramus branch with patent vien to PDA and patent LIMA to LAD, ef 40%-- Myoview 11-24-2011, nonischemic  . Diverticulosis   . Dyspnea on exertion   . GERD (gastroesophageal reflux disease)   . GI bleed   . History of sepsis    10-18-2014 w/ acute pyelonephritis  . Hyperlipidemia   . Hypertension   . Hypothyroidism   . Iron deficiency anemia   . Ischemic cardiomyopathy    03-25-2010-- per lasts echo EF  50-55%  . Ischemic cardiomyopathy   . PAD (peripheral artery disease) ([place])    09/2010 LEAs - R ABI of 0.45, occluded fem-pop bypass graft, L ABI of 0.59 with occluded SFA; severe arterial insuff  . PONV (postoperative nausea and vomiting)   . PVD (peripheral vascular disease) with claudication ([place])    last duplex 07-04-2015 -- Right CCA and ICA chronic occlusion, 43-32% LICA, Patent vertebral arteries w/ antegrade flow, bilateral normal subclavian arteries   . Retroperitoneal fibrosis   .  Tubular adenoma of colon   . Type 2 diabetes mellitus ([place])    monitored by dr Dwyane Dee    Patient Active Problem List   Diagnosis Date Noted  . Hydronephrosis 02/10/2019  . Spinal stenosis at L4-L5 level 01/02/2019  . Chronic pain syndrome 01/02/2019  . CAD (coronary artery disease) 01/02/2019  . Chronic prescription opiate use 10/20/2018  . Chronic diarrhea 07/17/2018  . Primary osteoarthritis of right knee 03/01/2018  . Closed fracture of lateral portion of right tibial plateau 03/01/2018  . Pressure injury of skin 01/25/2017  . UTI (urinary tract infection)  01/25/2017  . AKI (acute kidney injury) ([place]) 01/24/2017  . Fall 03/24/2016  . Atrial flutter ([place]) 01/26/2016  . Acute kidney injury superimposed on chronic kidney disease ([place]) 01/26/2016  . PAD (peripheral artery disease) ([place]) 01/26/2016  . Acute on chronic combined systolic and diastolic CHF  16/12/9602  . Melena   . Acute blood loss anemia   . GI bleed 10/18/2015  . Preop cardiovascular exam 09/04/2015  . Choledocholithiasis   . Pressure ulcer 08/29/2015  . Protein-calorie malnutrition, severe 08/29/2015  . Lung nodule   . Acute pyelonephritis 10/16/2014  . Type II or unspecified type diabetes mellitus with renal manifestations, uncontrolled(250.42) 01/10/2013  . Carotid artery disease ([place]) 01/03/2013  . DOE (dyspnea on exertion) 11/20/2012  . Claudication in peripheral vascular disease ([place]) 11/20/2012  . Bradycardia 11/20/2012  . Chronic kidney disease, stage III (moderate) ([place]) 11/08/2012  . Hyperlipidemia associated with type 2 diabetes mellitus ([place]) 10/01/2012  . Hypothyroidism 10/01/2012  . Chronic combined systolic and diastolic congestive heart failure ([place]) 06/08/2012  . Abdominal pain 03/09/2012  . Altered mental status 03/09/2012  . Diabetes mellitus, type II ([place]) 03/09/2012  . Generalized osteoarthritis of multiple sites 03/09/2012  . HTN (hypertension) 03/09/2012  . Acute encephalopathy 03/09/2012  . Normocytic anemia 03/09/2012  . Elevated LFTs 03/09/2012  . AAA (abdominal aortic aneurysm) ([place]) 03/09/2012  . Lower urinary tract infectious disease 03/08/2012  . Hx of CABG '97 03/08/2012    Past Surgical History:  Procedure Laterality Date  . AORTA - BILATERAL FEMORAL ARTERY BYPASS GRAFT  1997   and RIGHT FEM-POP   . CARDIAC CATHETERIZATION  12-09-2009  dr al little   EF >40%-- occluded vein to OM & ramus branches, patent vein to PDA, patent LIMA to LAD (Dr. [name], [facility]) - later had thrombectomy of R fem-pop bypass ad R common femoral & profunda  femoris artery (Dr. Oneida Alar)  . CARDIOVASCULAR STRESS TEST  11-24-2011   dr berry   Low Risk study: fixed basal to mid inferior attenuation artifact, no reversible ischemia,  normal LV function and wall motion , ef 67%  . CAROTID ENDARTERECTOMY Bilateral right 1994//  left ?  Marland Kitchen CATARACT EXTRACTION W/ INTRAOCULAR LENS  IMPLANT, BILATERAL  2006  . CHOLECYSTECTOMY N/A 09/05/2015   Procedure: ATTEMPTED LAPAROSCOPIC CHOLECYSTECTOMY, EXPLORATORY LAPAROTOMY WITH CHOLECYSTECTOMY;  Surgeon: Autumn Messing III, MD;  Location: [place];  Service: General;  Laterality: N/A;  . COLONOSCOPY    . COLONOSCOPY  10/21/2015  . COLONOSCOPY WITH PROPOFOL N/A 10/21/2015   Procedure: COLONOSCOPY WITH PROPOFOL;  Surgeon: Milus Banister, MD;  Location: [place];  Service: Endoscopy;  Laterality: N/A;  . CORONARY ARTERY BYPASS GRAFT  1997   x6; internal mammary to LAD, SVG to ramus #1 & #2, SVG to OM, SVG to PDA,   . CYSTOSCOPY W/ RETROGRADES Right 08/01/2015   Procedure: CYSTOSCOPY WITH RETROGRADE PYELOGRAM;  Surgeon: Viona Gilmore  Louis Meckel, MD;  Location: [facility];  Service: Urology;  Laterality: Right;  . CYSTOSCOPY W/ URETERAL STENT PLACEMENT  03/10/2012   Procedure: CYSTOSCOPY WITH RETROGRADE PYELOGRAM/URETERAL STENT PLACEMENT;  Surgeon: Hanley Ben, MD;  Location: [facility];  Service: Urology;  Laterality: Left;  . CYSTOSCOPY W/ URETERAL STENT PLACEMENT Bilateral 03/07/2015   Procedure: BILATERAL RETROGRADE PYELOGRAM AND RIGHT URETERAL STENT PLACEMENT;  Surgeon: Ardis Hughs, MD;  Location: [facility];  Service: Urology;  Laterality: Bilateral;  . CYSTOSCOPY W/ URETERAL STENT PLACEMENT Right 08/01/2015   Procedure: CYSTOSCOPY WITH STENT REPLACEMENT;  Surgeon: Ardis Hughs, MD;  Location[facility];  Service: Urology;  Laterality: Right;  . CYSTOSCOPY W/ URETERAL STENT PLACEMENT Right 02/13/2016   Procedure: RIGHT URETERAL STENT EXCHANGE;  Surgeon: Ardis Hughs, MD;  Location: [facility];  Service: Urology;  Laterality: Right;  . CYSTOSCOPY W/ URETERAL STENT PLACEMENT Right 09/17/2016   Procedure: CYSTOSCOPY WITH RIGHT  RETROGRADE PYELOGRAM RIGHT URETERAL STENT EXCHANGE;  Surgeon: Ardis Hughs, MD;  Location: [facility];  Service: Urology;  Laterality: Right;  . CYSTOSCOPY W/ URETERAL STENT PLACEMENT Bilateral 03/18/2017   Procedure: CYSTOSCOPY WITH BILATERAL  RETROGRADE PYELOGRAM RIGHT URETERAL STENT [place];  Surgeon: Ardis Hughs, MD;  Location: [facility];  Service: Urology;  Laterality: Bilateral;  . CYSTOSCOPY W/ URETERAL STENT PLACEMENT Right 05/26/2018   Procedure: CYSTOSCOPY WITH RIGHT RETROGRADE PYELOGRAM RIGHT URETERAL STENT EXCHANGE;  Surgeon: Ardis Hughs, MD;  Location: [facility];  Service: Urology;  Laterality: Right;  . CYSTOSCOPY WITH STENT PLACEMENT Right 10/06/2017   Procedure: CYSTOSCOPY, RETROGRADE  WITH RIGHT STENT EXCHANGE;  Surgeon: Ardis Hughs, MD;  Location: [facility];  Service: Urology;  Laterality: Right;  . CYSTOSCOPY WITH STENT PLACEMENT Right 12/01/2018   Procedure: CYSTOSCOPY WITH STENT PLACEMENT;  Surgeon: Ardis Hughs, MD;  Location[facility];  Service: Urology;  Laterality: Right;  . ERCP N/A 09/03/2015   Procedure: ENDOSCOPIC RETROGRADE CHOLANGIOPANCREATOGRAPHY (ERCP);  Surgeon: Irene Shipper, MD;  Location: [facility];  Service: Endoscopy;  Laterality: N/A;  . ESOPHAGOGASTRODUODENOSCOPY    . ESOPHAGOGASTRODUODENOSCOPY N/A 10/19/2015   Procedure: ESOPHAGOGASTRODUODENOSCOPY (EGD);  Surgeon: Gatha Mayer, MD;  Location: [facility];  Service: Endoscopy;  Laterality: N/A;  . GIVENS CAPSULE STUDY  10/21/2015  . GIVENS CAPSULE STUDY N/A 10/21/2015   Procedure: GIVENS CAPSULE STUDY;  Surgeon: Milus Banister, MD;  Location: [place];  Service: Endoscopy;  Laterality: N/A;  . KNEE SURGERY Right 03-01-2018-03-04-2018   PLATEAU FRACTURE HOSPITLIZATION   . REPAIR RIGHT FEMORAL PSEUDOANEUYSM/   RIGHT FEM-POP BYPASS GRAFT/  DEBRIDEMENT RIGHT LOWER EXTREMITIY VENOUS STATUS ULCERS X2  01-12-2005  . TOTAL ABDOMINAL HYSTERECTOMY W/ BILATERAL SALPINGOOPHORECTOMY  1986  . TRANSTHORACIC ECHOCARDIOGRAM  03/25/2010   EF 25-95%, LV systolic function low normal with mild inferoseptal hypocontractility; LA mildly dilated; mod MR; mild TR, RV systolic pressure elevated, mild pulm HTN; AV mildly sclerotic; mild pulm valve regurg; aortic root sclerosis/calcif      OB History   No obstetric history on file.     Family History  Problem Relation Age of Onset  . Congestive Heart Failure Mother   . Diabetes Mother   . Stroke Father   . Cancer Maternal Aunt        Breast cancer    Social History   Tobacco Use  . Smoking status: Former Smoker    Packs/day: 2.00    Years: 30.00    Pack years:  60.00    Quit date: 06/07/2009    Years since quitting: 9.7  . Smokeless tobacco: Never Used  Substance Use Topics  . Alcohol use: Yes    Alcohol/week: 3.0 standard drinks    Types: 3 Standard drinks or equivalent per week    Comment: ocassional  . Drug use: No    Home Medications Prior to Admission medications   Medication Sig Start Date End Date Taking? Authorizing Provider  acetaminophen (TYLENOL) 500 MG tablet Take 1,000 mg by mouth every 8 (eight) hours as needed.    [provider]  albuterol (VENTOLIN HFA) 108 (90 Base) MCG/ACT inhaler INHALE 2 PUFFS INTO THE LUNGS EVERY 6 HOURS AS NEEDED FOR WHEEZING OR SHORTNESS OF BREATH 01/18/19   Granville Lewis C, PA-C  aspirin 81 MG chewable tablet Chew 81 mg by mouth daily.     [provider]  atorvastatin (LIPITOR) 80 MG tablet Take 1 tablet (80 mg total) by mouth daily. 01/18/19   Granville Lewis C, PA-C  calcium-vitamin D (OSCAL WITH D) 500-200 MG-UNIT per tablet Take 1 tablet by mouth daily. In the morning    [provider]  cephALEXin (KEFLEX) 250 MG capsule Take 2 capsules (500 mg total) by mouth daily. Give  nightly for  suppressive Tx -No stop date 01/18/19   Wille Celeste, PA-C  clopidogrel (PLAVIX) 75 MG tablet Take 1 tablet (75 mg total) by mouth daily. *ANTIPLATELET * 01/18/19   Granville Lewis C, PA-C  docusate sodium (COLACE) 100 MG capsule Take 100 mg by mouth 2 (two) times daily.    [provider]  ergocalciferol (VITAMIN D2) 1.25 MG (50000 UT) capsule Take 50,000 Units by mouth. Every 14 days    [provider]  ferrous sulfate 325 (65 FE) MG tablet Take 2 tablets (650 mg total) by mouth every evening. 01/18/19   Granville Lewis C, PA-C  furosemide (LASIX) 20 MG tablet TAKE 1 TABLET(20 MG) BY MOUTH DAILY 01/18/19   Oscar La, [name] C, PA-C  glucose blood (ONE TOUCH ULTRA TEST) test strip USE TO TEST BLOOD SUGAR 1 TO 2 TIMES DAILY AS DIRECTED 02/13/18   [name], MD  HYDROcodone-acetaminophen (NORCO/VICODIN) 5-325 MG tablet Take 1 tablet by mouth every 6 (six) hours as needed for moderate pain or severe pain. 01/18/19   Granville Lewis C, PA-C  insulin aspart (NOVOLOG FLEXPEN) 100 UNIT/ML FlexPen Inject 6 Units into the skin 3 (three) times daily with meals. 01/18/19   Granville Lewis C, PA-C  insulin detemir (LEVEMIR) 100 UNIT/ML injection Inject 0.06 mLs (6 Units total) into the skin at bedtime. 01/18/19   Wille Celeste, PA-C  Insulin Pen Needle 32G X 4 MM MISC Use 4 times daily 04/29/17   [name], MD  isosorbide mononitrate (IMDUR) 30 MG 24 hr tablet Take 1 tablet (30 mg total) by mouth daily. 01/18/19   Granville Lewis C, PA-C  latanoprost (XALATAN) 0.005 % ophthalmic solution Place 1 drop into both eyes at bedtime. 01/18/19   Wille Celeste, PA-C  levothyroxine (SYNTHROID) 100 MCG tablet Take 1 tablet (100 mcg total) by mouth daily. 01/18/19   Granville Lewis C, PA-C  polyethylene glycol (MIRALAX / GLYCOLAX) 17 g packet Take 17 g by mouth daily.    [provider]  tamsulosin (FLOMAX) 0.4 MG CAPS capsule Take 1 capsule (0.4 mg total) by mouth daily. 01/18/19   Granville Lewis C, PA-C    trolamine salicylate (ASPERCREME) 10 % cream Apply 1 application topically 2 (  two) times daily as needed (arthritis pain.). Apply to lumbosacral area    [provider]  vitamin B-12 (CYANOCOBALAMIN) 1000 MCG tablet Take 1 tablet (1,000 mcg total) by mouth every evening. 01/18/19   Granville Lewis C, PA-C  vitamin C (ASCORBIC ACID) 500 MG tablet Take 500 mg by mouth at bedtime.     [provider]    Allergies    Patient has no known allergies.  Review of Systems   Review of Systems  Constitutional: Positive for fatigue. Negative for chills, diaphoresis and fever.  HENT: Negative for congestion, rhinorrhea and sneezing.   Eyes: Negative.   Respiratory: Negative for cough, chest tightness and shortness of breath.   Cardiovascular: Negative for chest pain and leg swelling.  Gastrointestinal: Positive for abdominal pain, nausea and vomiting. Negative for blood in stool and diarrhea.  Genitourinary: Positive for difficulty urinating and pelvic pain. Negative for flank pain, frequency and hematuria.  Musculoskeletal: Negative for arthralgias and back pain.  Skin: Negative for rash.  Neurological: Positive for weakness (generalized). Negative for dizziness, speech difficulty, numbness and headaches.    Physical Exam Updated Vital Signs BP (!) 118/56 (BP Location: Left Arm)   Pulse 69   Temp 97.7 F (36.5 C) (Axillary)   Resp 20   Wt 55 kg   SpO2 98%   BMI 19.57 kg/m   Physical Exam Constitutional:      Appearance: She is well-developed.  HENT:     Head: Normocephalic and atraumatic.  Eyes:     Pupils: Pupils are equal, round, and reactive to light.  Cardiovascular:     Rate and Rhythm: Normal rate and regular rhythm.     Heart sounds: Normal heart sounds.  Pulmonary:     Effort: Pulmonary effort is normal. No respiratory distress.     Breath sounds: Normal breath sounds. No wheezing or rales.  Chest:     Chest wall: No tenderness.  Abdominal:     General:  Bowel sounds are normal.     Palpations: Abdomen is soft.     Tenderness: There is abdominal tenderness in the suprapubic area and left lower quadrant. There is no guarding or rebound.  Genitourinary:    Comments: Some skin breakdown around her introitus and around the urethral meatus. Musculoskeletal:        General: Normal range of motion.     Cervical back: Normal range of motion and neck supple.  Lymphadenopathy:     Cervical: No cervical adenopathy.  Skin:    General: Skin is warm and dry.     Findings: No rash.  Neurological:     Mental Status: She is alert and oriented to person, place, and time.     ED Results / Procedures / Treatments   Labs (all labs ordered are listed, but only abnormal results are displayed) Labs Reviewed  BASIC METABOLIC PANEL - Abnormal; Notable for the following components:      Result Value   Glucose, Bld 116 (*)    BUN 75 (*)    Creatinine, Ser 1.72 (*)    Calcium 8.5 (*)    GFR calc non Af Amer 28 (*)    GFR calc Af Amer 33 (*)    All other components within normal limits  CBC WITH DIFFERENTIAL/PLATELET - Abnormal; Notable for the following components:   WBC 19.4 (*)    RBC 2.85 (*)    Hemoglobin 8.6 (*)    HCT 29.5 (*)    MCV 103.5 (*)  MCHC 29.2 (*)    RDW 16.7 (*)    Platelets 502 (*)    Neutro Abs 17.5 (*)    Abs Immature Granulocytes 0.18 (*)    All other components within normal limits  LACTIC ACID, PLASMA - Abnormal; Notable for the following components:   Lactic Acid, Venous 2.2 (*)    All other components within normal limits  LACTIC ACID, PLASMA - Abnormal; Notable for the following components:   Lactic Acid, Venous 2.0 (*)    All other components within normal limits  URINALYSIS, ROUTINE W REFLEX MICROSCOPIC - Abnormal; Notable for the following components:   Color, Urine AMBER (*)    APPearance TURBID (*)    Hgb urine dipstick LARGE (*)    Protein, ur 100 (*)    Leukocytes,Ua LARGE (*)    RBC / HPF >50 (*)     WBC, UA >50 (*)    Bacteria, UA MANY (*)    Squamous Epithelial / LPF >50 (*)    All other components within normal limits  APTT - Abnormal; Notable for the following components:   aPTT 39 (*)    All other components within normal limits  URINE CULTURE  CULTURE, BLOOD (ROUTINE X 2)  CULTURE, BLOOD (ROUTINE X 2)  SARS CORONAVIRUS 2 (TAT 6-24 HRS)  PROTIME-INR    EKG None  Radiology DG Chest Port 1 View  Result Date: 03/07/2019 CLINICAL DATA:  Sepsis.  Congestive heart failure. EXAM: PORTABLE CHEST 1 VIEW COMPARISON:  02/12/2019 FINDINGS: Previous median sternotomy and CABG. Heart size is normal. Chronic aortic atherosclerosis. Right lung remains clear. Chronic pleural and parenchymal scarring the left lung base with pleural calcification. No sign of active infiltrate. No acute bone finding. IMPRESSION: No active disease. Previous CABG. Aortic atherosclerosis. Chronic pleural and parenchymal scarring at the left lung base with extensive pleural calcification. Electronically Signed   By: Nelson Chimes M.D.   On: 02/28/2019 20:24   CT Renal Stone Study  Result Date: 02/13/2019 CLINICAL DATA:  Abdominal and back pain for the past 3 weeks. EXAM: CT ABDOMEN AND PELVIS WITHOUT CONTRAST TECHNIQUE: Multidetector CT imaging of the abdomen and pelvis was performed following the standard protocol without IV contrast. COMPARISON:  Renal ultrasound from same day. CT abdomen pelvis dated December 25, 2018. FINDINGS: Lower chest: No acute abnormality. Unchanged aneurysmal dilatation of the descending distal thoracic aorta measuring up to 4.2 cm. Unchanged left pleural calcifications. Hepatobiliary: No focal liver abnormality. Status post cholecystectomy. Unchanged pneumobilia. Unchanged mild common bile duct dilatation. Pancreas: Mildly atrophic. No ductal dilatation or surrounding inflammatory changes. Spleen: Normal in size without focal abnormality. Adrenals/Urinary Tract: The right adrenal gland is not  clearly identified. Normal left adrenal gland. Unchanged severe left hydroureteronephrosis and renal cortical thinning. Unchanged right double-J ureteral stent. Slightly worsened moderate right hydronephrosis. The bladder is decompressed by Foley catheter. Stomach/Bowel: Unchanged small hiatal hernia. The stomach is otherwise within normal limits. No bowel wall thickening, distention, or surrounding inflammatory changes. Colonic diverticulosis. The appendix is not identified but there are no signs of inflammation at the base of the cecum. Vascular/Lymphatic: Extensive aortoiliac and branch vessel atherosclerosis. Prior aortobifemoral bypass graft. No enlarged abdominal or pelvic lymph nodes. Reproductive: Status post hysterectomy. No adnexal masses. Other: No free fluid or pneumoperitoneum. Unchanged diastasis of the right anterior abdominal wall. Musculoskeletal: New cortical irregularity and bony destruction of the right superior pubic ramus with associated minimally displaced acute pathologic fracture. New 2.4 x 1.1 cm mass with small soft  tissue component involving the right inferior pubic ramus with associated nondisplaced acute pathologic fracture. Chronic T12 compression deformity. IMPRESSION: 1. New cortical irregularity and bony destruction of the right pubic rami with pathologic fractures, concerning for underlying metastatic disease. 2. Slightly worsened moderate right hydronephrosis with unchanged right double-J ureteral stent. Unchanged severe left hydroureteronephrosis. 3. 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 4.  Aortic atherosclerosis (ICD10-I70.0). Electronically Signed   By: Titus Dubin M.D.   On: 02/22/2019 18:53    Procedures Procedures (including critical care time)  Medications Ordered in ED Medications  ceFEPIme (MAXIPIME) 2 g in sodium chloride 0.9 % 100 mL IVPB (0 g Intravenous Stopped 02/22/2019 2044)  metroNIDAZOLE (FLAGYL) IVPB 500 mg (0 mg  Intravenous Stopped 02/18/2019 2120)  vancomycin (VANCOCIN) IVPB 1000 mg/200 mL premix (0 mg Intravenous Stopped 02/20/2019 2151)  fentaNYL (SUBLIMAZE) injection 50 mcg (50 mcg Intravenous Given 02/21/2019 2044)    ED Course  I have reviewed the triage vital signs and the nursing notes.  Pertinent labs & imaging results that were available during my care of the patient were reviewed by me and considered in my medical decision making (see chart for details).    MDM Rules/Calculators/A&P                      Patient is a 78 year old female who presents from Dr. Carlton Adam office with a decline.  She has had recurrent UTIs over the last 6 weeks or so with worsening pain in her lower pelvic area.  Given the recurrent UTIs and associated skin breakdown in her urethral/vaginal area, Dr. Louis Meckel is considering placing urostomy tube versus suprapubic catheter.  He will follow the patient along while she is in the hospital.  She does have evidence of UTI/sepsis and was treated aggressively with antibiotics and IV fluids.  CT scan shows evidence of pathologic appearing fractures of her pubic rami with an associated soft tissue mass.  I spoke with the radiologist who does not feel that it is likely that this is an infectious etiology.  He feels given the location of the different fractures it is most likely pathologic from neoplastic lesions.  Her labs show an elevated lactate and elevated WBC count.  Her hemoglobin is low but looks to be similar to prior recent values.  She was given medication to improve her pain and is more comfortable.  I have updated her daughter.  I spoke with Dr. Earnest Conroy who will admit the pt.  CRITICAL CARE Performed by: Malvin Johns Total critical care time: 45 minutes Critical care time was exclusive of separately billable procedures and treating other patients. Critical care was necessary to treat or prevent imminent or life-threatening deterioration. Critical care was time spent  personally by me on the following activities: development of treatment plan with patient and/or surrogate as well as nursing, discussions with consultants, evaluation of patient's response to treatment, examination of patient, obtaining history from patient or surrogate, ordering and performing treatments and interventions, ordering and review of laboratory studies, ordering and review of radiographic studies, pulse oximetry and re-evaluation of patient's condition.  Final Clinical Impression(s) / ED Diagnoses Final diagnoses:  Lower urinary tract infectious disease  Sepsis, due to unspecified organism, unspecified whether acute organ dysfunction present [facility])  Pubic ramus fracture, right, closed, initial encounter [facility])    Rx / DC Orders ED Discharge Orders    None       Malvin Johns, MD 02/12/2019 2236

## 2019-03-06 NOTE — ED Notes (Signed)
This writer attempted to start IV x 3 times w/ no success. This Probation officer will place IV team consult.

## 2019-03-06 NOTE — ED Notes (Signed)
Per Urologist, sending patient for admission-states multiple kidney issues-needs urostomy placed-urologist will put in office note

## 2019-03-06 NOTE — H&P (Signed)
History and Physical    [name] [name] OQH:[id number] DOB: 1941-02-10 DOA: 02/15/2019  PCP: [place], Betty G, MD Patient coming from: Home  Chief Complaint: Suprapubic abdominal pain, right-sided buttock/pelvic pain  HPI: [name] [name] is a 78 y.o. female with medical history significant of A. fib, AAA, CHF, CKD stage III, CAD status post CABG, GERD, history of GI bleed, hypertension, hyperlipidemia, hypothyroidism, PVD, type 2 diabetes presenting with complaints of suprapubic abdominal pain and right-sided buttock/pelvic pain.  She was sent to the ED by Dr. Louis Meckel from urology.  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  Her left kidney is nonfunctional and urology is no longer stenting that side.  Her right kidney has been managed with stent exchanges, last one being in September.  Patient has not done well since her last stent exchange and has had numerous UTIs.  She has not been emptying her bladder well.  Urology had placed a Foley catheter to minimize her risk of developing recurrent UTIs.  She was discharged from the hospital 2 weeks ago and sent to a nursing home and has not improved.  She has declined over the past week.  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  She has also developed right-sided hydronephrosis which was found on ultrasound done at urology office.  Urology sent the patient to the ED to be admitted as she will benefit from having a nephrostomy tube placed in her right kidney given that her right stent has failed.  Urology is also planning on removing her Foley catheter and instead placing a suprapubic catheter given concern for urethral necrosis.  Recommending keeping the patient n.p.o. after midnight and consulting interventional radiology in the morning.  Urology will follow along.  Patient states for the past 3 weeks she has had  a lot of suprapubic pain and right-sided pelvic/buttock pain.  No fevers or chills.  For the past 3 days she has been feeling nauseous and has had a few episodes of vomiting.  States she went to see her urologist yesterday and they changed her urinary catheter.  States she was admitted to another hospital a few weeks ago for pneumonia but her breathing is better now.  Denies cough, shortness of breath, or chest pain.  No other complaints.  ED Course: Afebrile.  Not tachycardic.  WBC count 19.4 with left shift.  Lactic acid 2.2 >2.0.  hemoglobin 8.6, ranging between 8.3-9.1 since 10/22.  MCV 103.5.  Platelet count 502,000, was 471,000 on 10/28.  BUN 75, creatinine 1.7.  Creatinine ranging between 1.5-1.8 since 10/28.  UA (not clean catch) with large amount of hemoglobin, large amount of leukocytes, greater than 50 RBCs, greater than 50 WBCs, and many bacteria.  Urine culture pending.  Blood culture x2 pending.  SARS-CoV-2 PCR test pending.  Chest x-ray showing no active disease.  CT renal stone study showing new cortical irregularity and bony destruction of the right pubic rami with pathologic fractures, concerning for underlying metastatic disease.  Slightly worsened moderate right hydronephrosis with unchanged right double-J ureteral stent.  Unchanged severe left hydroureteronephrosis.  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 Patient received vancomycin, cefepime, and metronidazole in the ED.  Review of Systems:  All systems reviewed and apart from history of presenting illness, are negative.  Past Medical History:  Diagnosis Date  . A-fib ([place])   . AAA (abdominal aortic  aneurysm) ([place])   . Bilateral hydronephrosis   . Carotid artery stenosis    carotid doppler 06/2012 - Right CCA/Bulb/ICA with chronic occlusion; L vertebral artery with abnormal blood flow; L Bulb/Prox ICA  s/p endarterectomy with mild fibrous plaque, 50% diameter reduction  . CHF (congestive heart  failure) ([place])   . Choledocholithiasis 2017  . CKD (chronic kidney disease), stage III   . Coronary artery disease due to lipid rich plaque cardiologist-  dr berry   s/p CABG x6 1997-- cath 12-09-2009 occluded vein to obtuse marginal branch and ramus branch with patent vien to PDA and patent LIMA to LAD, ef 40%-- Myoview 11-24-2011, nonischemic  . Diverticulosis   . Dyspnea on exertion   . GERD (gastroesophageal reflux disease)   . GI bleed   . History of sepsis    10-18-2014 w/ acute pyelonephritis  . Hyperlipidemia   . Hypertension   . Hypothyroidism   . Iron deficiency anemia   . Ischemic cardiomyopathy    03-25-2010-- per lasts echo EF  50-55%  . Ischemic cardiomyopathy   . PAD (peripheral artery disease) ([place])    09/2010 LEAs - R ABI of 0.45, occluded fem-pop bypass graft, L ABI of 0.59 with occluded SFA; severe arterial insuff  . PONV (postoperative nausea and vomiting)   . PVD (peripheral vascular disease) with claudication ([place])    last duplex 07-04-2015 -- Right CCA and ICA chronic occlusion, 25-05% LICA, Patent vertebral arteries w/ antegrade flow, bilateral normal subclavian arteries   . Retroperitoneal fibrosis   . Tubular adenoma of colon   . Type 2 diabetes mellitus ([place])    monitored by dr Dwyane Dee    Past Surgical History:  Procedure Laterality Date  . AORTA - BILATERAL FEMORAL ARTERY BYPASS GRAFT  1997   and RIGHT FEM-POP   . CARDIAC CATHETERIZATION  12-09-2009  dr al little   EF >40%-- occluded vein to OM & ramus branches, patent vein to PDA, patent LIMA to LAD (Dr. [name], [facility]) - later had thrombectomy of R fem-pop bypass ad R common femoral & profunda femoris artery (Dr. Oneida Alar)  . CARDIOVASCULAR STRESS TEST  11-24-2011   dr berry   Low Risk study: fixed basal to mid inferior attenuation artifact, no reversible ischemia,  normal LV function and wall motion , ef 67%  . CAROTID ENDARTERECTOMY Bilateral right 1994//  left ?  Marland Kitchen CATARACT EXTRACTION W/ INTRAOCULAR  LENS  IMPLANT, BILATERAL  2006  . CHOLECYSTECTOMY N/A 09/05/2015   Procedure: ATTEMPTED LAPAROSCOPIC CHOLECYSTECTOMY, EXPLORATORY LAPAROTOMY WITH CHOLECYSTECTOMY;  Surgeon: Autumn Messing III, MD;  Location: [place];  Service: General;  Laterality: N/A;  . COLONOSCOPY    . COLONOSCOPY  10/21/2015  . COLONOSCOPY WITH PROPOFOL N/A 10/21/2015   Procedure: COLONOSCOPY WITH PROPOFOL;  Surgeon: Milus Banister, MD;  Location: [place];  Service: Endoscopy;  Laterality: N/A;  . CORONARY ARTERY BYPASS GRAFT  1997   x6; internal mammary to LAD, SVG to ramus #1 & #2, SVG to OM, SVG to PDA,   . CYSTOSCOPY W/ RETROGRADES Right 08/01/2015   Procedure: CYSTOSCOPY WITH RETROGRADE PYELOGRAM;  Surgeon: Ardis Hughs, MD;  Location[facility]  Service: Urology;  Laterality: Right;  . CYSTOSCOPY W/ URETERAL STENT PLACEMENT  03/10/2012   Procedure: CYSTOSCOPY WITH RETROGRADE PYELOGRAM/URETERAL STENT PLACEMENT;  Surgeon: Hanley Ben, MD;  Location: [facility];  Service: Urology;  Laterality: Left;  . CYSTOSCOPY W/ URETERAL STENT PLACEMENT Bilateral 03/07/2015   Procedure: BILATERAL RETROGRADE PYELOGRAM AND RIGHT  URETERAL STENT PLACEMENT;  Surgeon: Ardis Hughs, MD;  Location: [facility];  Service: Urology;  Laterality: Bilateral;  . CYSTOSCOPY W/ URETERAL STENT PLACEMENT Right 08/01/2015   Procedure: CYSTOSCOPY WITH STENT REPLACEMENT;  Surgeon: Ardis Hughs, MD;  Location: [facility];  Service: Urology;  Laterality: Right;  . CYSTOSCOPY W/ URETERAL STENT PLACEMENT Right 02/13/2016   Procedure: RIGHT URETERAL STENT EXCHANGE;  Surgeon: Ardis Hughs, MD;  Location: [facility];  Service: Urology;  Laterality: Right;  . CYSTOSCOPY W/ URETERAL STENT PLACEMENT Right 09/17/2016   Procedure: CYSTOSCOPY WITH RIGHT  RETROGRADE PYELOGRAM RIGHT URETERAL STENT EXCHANGE;  Surgeon: Ardis Hughs, MD;  Location: [facility];  Service: Urology;  Laterality: Right;  .  CYSTOSCOPY W/ URETERAL STENT PLACEMENT Bilateral 03/18/2017   Procedure: CYSTOSCOPY WITH BILATERAL  RETROGRADE PYELOGRAM RIGHT URETERAL STENT [place];  Surgeon: Ardis Hughs, MD;  Location: [facility];  Service: Urology;  Laterality: Bilateral;  . CYSTOSCOPY W/ URETERAL STENT PLACEMENT Right 05/26/2018   Procedure: CYSTOSCOPY WITH RIGHT RETROGRADE PYELOGRAM RIGHT URETERAL STENT EXCHANGE;  Surgeon: Ardis Hughs, MD;  Location: [facility];  Service: Urology;  Laterality: Right;  . CYSTOSCOPY WITH STENT PLACEMENT Right 10/06/2017   Procedure: CYSTOSCOPY, RETROGRADE  WITH RIGHT STENT EXCHANGE;  Surgeon: Ardis Hughs, MD;  Location: [facility];  Service: Urology;  Laterality: Right;  . CYSTOSCOPY WITH STENT PLACEMENT Right 12/01/2018   Procedure: CYSTOSCOPY WITH STENT PLACEMENT;  Surgeon: Ardis Hughs, MD;  Location[facility];  Service: Urology;  Laterality: Right;  . ERCP N/A 09/03/2015   Procedure: ENDOSCOPIC RETROGRADE CHOLANGIOPANCREATOGRAPHY (ERCP);  Surgeon: Irene Shipper, MD;  Location[facility];  Service: Endoscopy;  Laterality: N/A;  . ESOPHAGOGASTRODUODENOSCOPY    . ESOPHAGOGASTRODUODENOSCOPY N/A 10/19/2015   Procedure: ESOPHAGOGASTRODUODENOSCOPY (EGD);  Surgeon: Gatha Mayer, MD;  Location: [facility];  Service: Endoscopy;  Laterality: N/A;  . GIVENS CAPSULE STUDY  10/21/2015  . GIVENS CAPSULE STUDY N/A 10/21/2015   Procedure: GIVENS CAPSULE STUDY;  Surgeon: Milus Banister, MD;  Location: [place];  Service: Endoscopy;  Laterality: N/A;  . KNEE SURGERY Right 03-01-2018-03-04-2018   PLATEAU FRACTURE HOSPITLIZATION   . REPAIR RIGHT FEMORAL PSEUDOANEUYSM/  RIGHT FEM-POP BYPASS GRAFT/  DEBRIDEMENT RIGHT LOWER EXTREMITIY VENOUS STATUS ULCERS X2  01-12-2005  . TOTAL ABDOMINAL HYSTERECTOMY W/ BILATERAL SALPINGOOPHORECTOMY  1986  . TRANSTHORACIC ECHOCARDIOGRAM  03/25/2010   EF 15-40%, LV systolic function low normal with mild inferoseptal hypocontractility; LA mildly  dilated; mod MR; mild TR, RV systolic pressure elevated, mild pulm HTN; AV mildly sclerotic; mild pulm valve regurg; aortic root sclerosis/calcif      reports that she quit smoking about 9 years ago. She has a 60.00 pack-year smoking history. She has never used smokeless tobacco. She reports current alcohol use of about 3.0 standard drinks of alcohol per week. She reports that she does not use drugs.  No Known Allergies  Family History  Problem Relation Age of Onset  . Congestive Heart Failure Mother   . Diabetes Mother   . Stroke Father   . Cancer Maternal Aunt        Breast cancer    Prior to Admission medications   Medication Sig Start Date End Date Taking? Authorizing Provider  acetaminophen (TYLENOL) 500 MG tablet Take 1,000 mg by mouth every 8 (eight) hours as needed.    [provider]  albuterol (VENTOLIN HFA) 108 (90 Base) MCG/ACT inhaler INHALE 2 PUFFS INTO THE LUNGS EVERY 6  HOURS AS NEEDED FOR WHEEZING OR SHORTNESS OF BREATH 01/18/19   Granville Lewis C, PA-C  aspirin 81 MG chewable tablet Chew 81 mg by mouth daily.     [provider]  atorvastatin (LIPITOR) 80 MG tablet Take 1 tablet (80 mg total) by mouth daily. 01/18/19   Gr[place]ville Lewis C, PA-C  calcium-vitamin D (OSCAL WITH D) 500-200 MG-UNIT per tablet Take 1 tablet by mouth daily. In the morning    [provider]  cephALEXin (KEFLEX) 250 MG capsule Take 2 capsules (500 mg total) by mouth daily. Give  nightly for suppressive Tx -No stop date 01/18/19   Wille Celeste, PA-C  clopidogrel (PLAVIX) 75 MG tablet Take 1 tablet (75 mg total) by mouth daily. *ANTIPLATELET * 01/18/19   Granville Lewis C, PA-C  docusate sodium (COLACE) 100 MG capsule Take 100 mg by mouth 2 (two) times daily.    [provider]  ergocalciferol (VITAMIN D2) 1.25 MG (50000 UT) capsule Take 50,000 Units by mouth. Every 14 days    [provider]  ferrous sulfate 325 (65 FE) MG tablet Take 2 tablets (650 mg total) by  mouth every evening. 01/18/19   Granville Lewis C, PA-C  furosemide (LASIX) 20 MG tablet TAKE 1 TABLET(20 MG) BY MOUTH DAILY 01/18/19   Oscar La, [name] C, PA-C  glucose blood (ONE TOUCH ULTRA TEST) test strip USE TO TEST BLOOD SUGAR 1 TO 2 TIMES DAILY AS DIRECTED 02/13/18   [name], MD  HYDROcodone-acetaminophen (NORCO/VICODIN) 5-325 MG tablet Take 1 tablet by mouth every 6 (six) hours as needed for moderate pain or severe pain. 01/18/19   Granville Lewis C, PA-C  insulin aspart (NOVOLOG FLEXPEN) 100 UNIT/ML FlexPen Inject 6 Units into the skin 3 (three) times daily with meals. 01/18/19   Granville Lewis C, PA-C  insulin detemir (LEVEMIR) 100 UNIT/ML injection Inject 0.06 mLs (6 Units total) into the skin at bedtime. 01/18/19   Wille Celeste, PA-C  Insulin Pen Needle 32G X 4 MM MISC Use 4 times daily 04/29/17   [name], MD  isosorbide mononitrate (IMDUR) 30 MG 24 hr tablet Take 1 tablet (30 mg total) by mouth daily. 01/18/19   Granville Lewis C, PA-C  latanoprost (XALATAN) 0.005 % ophthalmic solution Place 1 drop into both eyes at bedtime. 01/18/19   Wille Celeste, PA-C  levothyroxine (SYNTHROID) 100 MCG tablet Take 1 tablet (100 mcg total) by mouth daily. 01/18/19   Granville Lewis C, PA-C  polyethylene glycol (MIRALAX / GLYCOLAX) 17 g packet Take 17 g by mouth daily.    [provider]  tamsulosin (FLOMAX) 0.4 MG CAPS capsule Take 1 capsule (0.4 mg total) by mouth daily. 01/18/19   Granville Lewis C, PA-C  trolamine salicylate (ASPERCREME) 10 % cream Apply 1 application topically 2 (two) times daily as needed (arthritis pain.). Apply to lumbosacral area    [provider]  vitamin B-12 (CYANOCOBALAMIN) 1000 MCG tablet Take 1 tablet (1,000 mcg total) by mouth every evening. 01/18/19   Granville Lewis C, PA-C  vitamin C (ASCORBIC ACID) 500 MG tablet Take 500 mg by mouth at bedtime.     [provider]    Physical Exam: Vitals:   02/22/2019 1644 02/26/2019 1942 02/24/2019 2241  BP: 129/66 (!)  118/56 (!) 101/49  Pulse: 64 69 70  Resp: 15 20 16   Temp: 97.7 F (36.5 C)    TempSrc: Axillary    SpO2: 93% 98% 100%  Weight: 55 kg  Physical Exam  Constitutional: She is oriented to person, place, and time. She appears well-developed and well-nourished. No distress.  Frail, cachectic  HENT:  Head: Normocephalic.  Eyes: Right eye exhibits no discharge. Left eye exhibits no discharge.  Cardiovascular: Normal rate, regular rhythm and intact distal pulses.  Pulmonary/Chest: Effort normal. She has no wheezes. She has no rales.  Equal air entry bilaterally upon auscultation of anterior lung fields  Abdominal: Soft. Bowel sounds are normal. She exhibits no distension. There is abdominal tenderness. There is guarding. There is no rebound.  Suprapubic region tender to palpation with guarding  Musculoskeletal:        General: No edema.     Cervical back: Neck supple.  Neurological: She is alert and oriented to person, place, and time.  Skin: Skin is warm and dry. She is not diaphoretic.     Labs on Admission: I have personally reviewed following labs and imaging studies  CBC: Recent Labs  Lab 02/23/2019 1718  WBC 19.4*  NEUTROABS 17.5*  HGB 8.6*  HCT 29.5*  MCV 103.5*  PLT 371*   Basic Metabolic Panel: Recent Labs  Lab 02/19/2019 1718  NA 140  K 4.2  CL 103  CO2 25  GLUCOSE 116*  BUN 75*  CREATININE 1.72*  CALCIUM 8.5*   GFR: Estimated Creatinine Clearance: 23.8 mL/min (A) (by C-G formula based on SCr of 1.72 mg/dL (H)). Liver Function Tests: No results for input(s): AST, ALT, ALKPHOS, BILITOT, PROT, ALBUMIN in the last 168 hours. No results for input(s): LIPASE, AMYLASE in the last 168 hours. No results for input(s): AMMONIA in the last 168 hours. Coagulation Profile: Recent Labs  Lab 02/26/2019 1718  INR 1.1   Cardiac Enzymes: No results for input(s): CKTOTAL, CKMB, CKMBINDEX, TROPONINI in the last 168 hours. BNP (last 3 results) No results for input(s):  PROBNP in the last 8760 hours. HbA1C: No results for input(s): HGBA1C in the last 72 hours. CBG: No results for input(s): GLUCAP in the last 168 hours. Lipid Profile: No results for input(s): CHOL, HDL, LDLCALC, TRIG, CHOLHDL, LDLDIRECT in the last 72 hours. Thyroid Function Tests: No results for input(s): TSH, T4TOTAL, FREET4, T3FREE, THYROIDAB in the last 72 hours. Anemia Panel: No results for input(s): VITAMINB12, FOLATE, FERRITIN, TIBC, IRON, RETICCTPCT in the last 72 hours. Urine analysis:    Component Value Date/Time   COLORURINE AMBER (A) 02/08/2019 1957   APPEARANCEUR TURBID (A) 02/08/2019 1957   LABSPEC 1.014 02/23/2019 1957   PHURINE 5.0 02/25/2019 1957   GLUCOSEU NEGATIVE 02/21/2019 1957   GLUCOSEU NEGATIVE 09/04/2018 0833   HGBUR LARGE (A) 03/03/2019 1957   BILIRUBINUR NEGATIVE 02/23/2019 1957   KETONESUR NEGATIVE 02/06/2019 1957   PROTEINUR 100 (A) 03/05/2019 1957   UROBILINOGEN 0.2 09/04/2018 0833   NITRITE NEGATIVE 03/04/2019 1957   LEUKOCYTESUR LARGE (A) 02/27/2019 1957    Radiological Exams on Admission: DG Chest Port 1 View  Result Date: 02/16/2019 CLINICAL DATA:  Sepsis.  Congestive heart failure. EXAM: PORTABLE CHEST 1 VIEW COMPARISON:  02/12/2019 FINDINGS: Previous median sternotomy and CABG. Heart size is normal. Chronic aortic atherosclerosis. Right lung remains clear. Chronic pleural and parenchymal scarring the left lung base with pleural calcification. No sign of active infiltrate. No acute bone finding. IMPRESSION: No active disease. Previous CABG. Aortic atherosclerosis. Chronic pleural and parenchymal scarring at the left lung base with extensive pleural calcification. Electronically Signed   By: Nelson Chimes M.D.   On: 02/23/2019 20:24   CT Renal Stone Study  Result Date: 02/10/2019 CLINICAL DATA:  Abdominal and back pain for the past 3 weeks. EXAM: CT ABDOMEN AND PELVIS WITHOUT CONTRAST TECHNIQUE: Multidetector CT imaging of the abdomen and pelvis  was performed following the standard protocol without IV contrast. COMPARISON:  Renal ultrasound from same day. CT abdomen pelvis dated December 25, 2018. FINDINGS: Lower chest: No acute abnormality. Unchanged aneurysmal dilatation of the descending distal thoracic aorta measuring up to 4.2 cm. Unchanged left pleural calcifications. Hepatobiliary: No focal liver abnormality. Status post cholecystectomy. Unchanged pneumobilia. Unchanged mild common bile duct dilatation. Pancreas: Mildly atrophic. No ductal dilatation or surrounding inflammatory changes. Spleen: Normal in size without focal abnormality. Adrenals/Urinary Tract: The right adrenal gland is not clearly identified. Normal left adrenal gland. Unchanged severe left hydroureteronephrosis and renal cortical thinning. Unchanged right double-J ureteral stent. Slightly worsened moderate right hydronephrosis. The bladder is decompressed by Foley catheter. Stomach/Bowel: Unchanged small hiatal hernia. The stomach is otherwise within normal limits. No bowel wall thickening, distention, or surrounding inflammatory changes. Colonic diverticulosis. The appendix is not identified but there are no signs of inflammation at the base of the cecum. Vascular/Lymphatic: Extensive aortoiliac and branch vessel atherosclerosis. Prior aortobifemoral bypass graft. No enlarged abdominal or pelvic lymph nodes. Reproductive: Status post hysterectomy. No adnexal masses. Other: No free fluid or pneumoperitoneum. Unchanged diastasis of the right anterior abdominal wall. Musculoskeletal: New cortical irregularity and bony destruction of the right superior pubic ramus with associated minimally displaced acute pathologic fracture. New 2.4 x 1.1 cm mass with small soft tissue component involving the right inferior pubic ramus with associated nondisplaced acute pathologic fracture. Chronic T12 compression deformity. IMPRESSION: 1. New cortical irregularity and bony destruction of the right  pubic rami with pathologic fractures, concerning for underlying metastatic disease. 2. Slightly worsened moderate right hydronephrosis with unchanged right double-J ureteral stent. Unchanged severe left hydroureteronephrosis. 3. 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 4.  Aortic atherosclerosis (ICD10-I70.0). Electronically Signed   By: Titus Dubin M.D.   On: 02/10/2019 18:53    Assessment/Plan Principal Problem:   UTI (urinary tract infection) due to urinary indwelling Foley catheter ([place]) Active Problems:   Sepsis ([place])   Acute kidney injury superimposed on chronic kidney disease ([place])   Hydronephrosis   Multiple pathological fractures   Sepsis secondary to CAUTI Afebrile.  WBC count 19.4 with left shift.  Lactic acid 2.2 >2.0. UA (not clean catch) with large amount of hemoglobin, large amount of leukocytes, greater than 50 RBCs, greater than 50 WBCs, and many bacteria.  -History of HFrEF.  Give fluids cautiously. Will order a 500 cc normal saline bolus and recheck lactate level.  Continue gentle IV fluid hydration.  Not hypotensive or tachycardic. 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  Her left kidney is nonfunctional and urology is no longer stenting that side.  Her right kidney has been managed with stent exchanges, last one being in September.  Patient has not done well since her last stent exchange and has had numerous UTIs.  She has not been emptying her bladder well.  Urology had placed a Foley catheter to minimize her risk of developing recurrent UTIs.  Patient is currently complaining of suprapubic pain.  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  She  has  also developed right-sided hydronephrosis which was found on ultrasound done at urology office.  CT renal stone study done in the ED showing slightly worsened moderate right hydronephrosis with unchanged right double-J ureteral stent.  Unchanged severe left hydroureteronephrosis.    -Urology planning on placing a nephrostomy tube in her right kidney given that her right stent has failed.  Also planning on removing her Foley catheter and instead placing a suprapubic catheter given concern for urethral necrosis.  Recommending keeping the patient n.p.o. after midnight and consulting interventional radiology in the morning.  Urology will follow along. -Monitor urine output -Keep n.p.o. after midnight  Pathologic fractures/concern for metastatic disease Patient is complaining of right-sided pelvic/buttock pain.  CT showing new cortical irregularity and bony destruction of the right pubic rami with pathologic fractures, concerning for underlying metastatic disease.   -Consult oncology in a.m.  AKI on CKD stage III Suspect prerenal due to dehydration.  BUN 75, creatinine 1.7 at this time.  Baseline creatinine 1.1-1.3 and was 1.2 on 12/10 per Care Everywhere. -Gentle IV fluid hydration given history of HFrEF -Monitor renal function and urine output -Avoid nephrotoxic agents -Continue to monitor renal function  Macrocytic anemia Hemoglobin 8.6, ranging between 8.3-9.1 since 10/22.  MCV 103.5.  No signs of active bleeding. -Check FOBT -Anemia panel  Pressure ulcers Patient has pressure ulcers to the sacrum and feet (ankle/ heel). -Wound care consult  Hypothyroidism -Resume Synthroid after pharmacy med rec is done  Type 2 diabetes -Check A1c.  Sliding scale insulin every 4 hours and CBG checks.  Pharmacy med rec pending.  DVT prophylaxis: SCDs at this time Code Status: Patient wishes to be full code. Family Communication: No family available at this time. Disposition Plan: Anticipate  discharge after clinical improvement. Consults called: None Admission status: It is my clinical opinion that admission to INPATIENT is reasonable and necessary in this 78 y.o. female . presenting with sepsis secondary to CAUTI.  Bilateral hydronephrosis secondary to ureteral strictures.  Urology following.  High risk of decompensation.  Given the aforementioned, the predictability of an adverse outcome is felt to be significant. I expect that the patient will require at least 2 midnights in the hospital to treat this condition.   The medical decision making on this patient was of high complexity and the patient is at high risk for clinical deterioration, therefore this is a level 3 visit.  Shela Leff MD [facility] Pager [phone]  If 7PM-7AM, please contact night-coverage [email or web address] Password [email or web address]  02/13/2019, 11:17 PM

## 2019-03-06 NOTE — ED Triage Notes (Signed)
Patient c/o back pain and ABD pain that began 3 weeks ago.   Patient has stent placement and urinary catheter placement for unknown cause.

## 2019-03-06 NOTE — ED Notes (Signed)
Date and time results received: 03/02/2019 1842 (use smartphrase ".now" to insert current time)  Test: Lactic Acid Critical Value: 2.2  Name of Provider Notified: Belfi,MD  Orders Received? Or Actions Taken?: Orders Received - See Orders for details

## 2019-03-07 ENCOUNTER — Inpatient Hospital Stay (HOSPITAL_COMMUNITY): Payer: Medicare Other

## 2019-03-07 DIAGNOSIS — N1339 Other hydronephrosis: Secondary | ICD-10-CM

## 2019-03-07 LAB — BASIC METABOLIC PANEL
Anion gap: 8 (ref 5–15)
BUN: 72 mg/dL — ABNORMAL HIGH (ref 8–23)
CO2: 24 mmol/L (ref 22–32)
Calcium: 7.6 mg/dL — ABNORMAL LOW (ref 8.9–10.3)
Chloride: 108 mmol/L (ref 98–111)
Creatinine, Ser: 1.48 mg/dL — ABNORMAL HIGH (ref 0.44–1.00)
GFR calc Af Amer: 39 mL/min — ABNORMAL LOW (ref >60)
GFR calc non Af Amer: 34 mL/min — ABNORMAL LOW (ref >60)
Glucose, Bld: 97 mg/dL (ref 70–99)
Potassium: 3.5 mmol/L (ref 3.5–5.1)
Sodium: 140 mmol/L (ref 135–145)

## 2019-03-07 LAB — LACTIC ACID, PLASMA: Lactic Acid, Venous: 1.4 mmol/L (ref 0.5–1.9)

## 2019-03-07 LAB — CBG MONITORING, ED
Glucose-Capillary: 91 mg/dL (ref 70–99)
Glucose-Capillary: 96 mg/dL (ref 70–99)
Glucose-Capillary: 87 mg/dL (ref 70–99)

## 2019-03-07 LAB — CBC
HCT: 26 % — ABNORMAL LOW (ref 36.0–46.0)
Hemoglobin: 7.5 g/dL — ABNORMAL LOW (ref 12.0–15.0)
MCH: 30 pg (ref 26.0–34.0)
MCHC: 28.8 g/dL — ABNORMAL LOW (ref 30.0–36.0)
MCV: 104 fL — ABNORMAL HIGH (ref 80.0–100.0)
Platelets: 416 10*3/uL — ABNORMAL HIGH (ref 150–400)
RBC: 2.5 MIL/uL — ABNORMAL LOW (ref 3.87–5.11)
RDW: 16.7 % — ABNORMAL HIGH (ref 11.5–15.5)
WBC: 14.6 10*3/uL — ABNORMAL HIGH (ref 4.0–10.5)
nRBC: 0 % (ref 0.0–0.2)

## 2019-03-07 LAB — MRSA PCR SCREENING: MRSA by PCR: NEGATIVE

## 2019-03-07 LAB — URINE CULTURE

## 2019-03-07 LAB — RETICULOCYTES
Immature Retic Fract: 20.5 % — ABNORMAL HIGH (ref 2.3–15.9)
RBC.: 2.49 MIL/uL — ABNORMAL LOW (ref 3.87–5.11)
Retic Count, Absolute: 49.6 10*3/uL (ref 19.0–186.0)
Retic Ct Pct: 2 % (ref 0.4–3.1)

## 2019-03-07 LAB — HEMOGLOBIN A1C
Hgb A1c MFr Bld: 4.9 % (ref 4.8–5.6)
Mean Plasma Glucose: 93.93 mg/dL

## 2019-03-07 LAB — FOLATE: Folate: 5.8 ng/mL — ABNORMAL LOW (ref >5.9)

## 2019-03-07 LAB — SARS CORONAVIRUS 2 (TAT 6-24 HRS): SARS Coronavirus 2: NEGATIVE

## 2019-03-07 LAB — GLUCOSE, CAPILLARY
Glucose-Capillary: 92 mg/dL (ref 70–99)
Glucose-Capillary: 102 mg/dL — ABNORMAL HIGH (ref 70–99)

## 2019-03-07 LAB — IRON AND TIBC: Iron: 45 ug/dL (ref 28–170)

## 2019-03-07 LAB — VITAMIN B12: Vitamin B-12: >7500 pg/mL — ABNORMAL HIGH (ref 180–914)

## 2019-03-07 LAB — FERRITIN: Ferritin: 1342 ng/mL — ABNORMAL HIGH (ref 11–307)

## 2019-03-07 MED ORDER — LEVOTHYROXINE SODIUM 75 MCG PO TABS
150.0000 ug | ORAL_TABLET | Freq: Every day | ORAL | Status: DC
  Administered 2019-03-08 – 2019-03-11 (×4): 150 ug via ORAL
  Filled 2019-03-07 (×2): qty 2
  Filled 2019-03-07 (×2): qty 1

## 2019-03-07 MED ORDER — ACETAMINOPHEN 325 MG PO TABS: 650.0000 mg | ORAL_TABLET | Freq: Four times a day (QID) | ORAL | Status: DC | PRN

## 2019-03-07 MED ORDER — INSULIN ASPART 100 UNIT/ML ~~LOC~~ SOLN: 0.0000 [IU] | Freq: Three times a day (TID) | SUBCUTANEOUS | Status: DC

## 2019-03-07 MED ORDER — ACETAMINOPHEN 10 MG/ML IV SOLN
1000.0000 mg | Freq: Four times a day (QID) | INTRAVENOUS | Status: AC
  Administered 2019-03-07: 1000 mg via INTRAVENOUS
  Filled 2019-03-07: qty 100

## 2019-03-07 MED ORDER — INSULIN ASPART 100 UNIT/ML ~~LOC~~ SOLN: 0.0000 [IU] | Freq: Every day | SUBCUTANEOUS | Status: DC

## 2019-03-07 MED ORDER — TAMSULOSIN HCL 0.4 MG PO CAPS
0.4000 mg | ORAL_CAPSULE | Freq: Every day | ORAL | Status: DC
  Administered 2019-03-07 – 2019-03-10 (×3): 0.4 mg via ORAL
  Filled 2019-03-07 (×4): qty 1

## 2019-03-07 MED ORDER — ZINC GLUCONATE 50 MG PO CAPS: 50.0000 mg | ORAL_CAPSULE | Freq: Every day | ORAL | Status: DC

## 2019-03-07 MED ORDER — POLYETHYLENE GLYCOL 3350 17 G PO PACK
17.0000 g | PACK | Freq: Every day | ORAL | Status: DC
  Administered 2019-03-07 – 2019-03-08 (×2): 17 g via ORAL
  Filled 2019-03-07 (×2): qty 1

## 2019-03-07 MED ORDER — HYDROCODONE-ACETAMINOPHEN 5-325 MG PO TABS
1.0000 | ORAL_TABLET | ORAL | Status: DC | PRN
  Administered 2019-03-07 – 2019-03-08 (×5): 1 via ORAL
  Administered 2019-03-09 (×2): 2 via ORAL
  Filled 2019-03-07 (×4): qty 1
  Filled 2019-03-07: qty 2
  Filled 2019-03-07: qty 1
  Filled 2019-03-07: qty 2
  Filled 2019-03-07: qty 1

## 2019-03-07 MED ORDER — INSULIN DETEMIR 100 UNIT/ML ~~LOC~~ SOLN
3.0000 [IU] | Freq: Every day | SUBCUTANEOUS | Status: DC
  Administered 2019-03-07 – 2019-03-08 (×2): 3 [IU] via SUBCUTANEOUS
  Filled 2019-03-07 (×2): qty 0.03

## 2019-03-07 MED ORDER — KETOROLAC TROMETHAMINE 15 MG/ML IJ SOLN: 7.5000 mg | Freq: Four times a day (QID) | INTRAMUSCULAR | Status: DC | PRN

## 2019-03-07 MED ORDER — CHLORHEXIDINE GLUCONATE CLOTH 2 % EX PADS
6.0000 | MEDICATED_PAD | Freq: Every day | CUTANEOUS | Status: DC
  Administered 2019-03-07 – 2019-03-11 (×5): 6 via TOPICAL

## 2019-03-07 MED ORDER — FERROUS SULFATE 325 (65 FE) MG PO TABS
650.0000 mg | ORAL_TABLET | Freq: Every evening | ORAL | Status: DC
  Administered 2019-03-07 – 2019-03-09 (×3): 650 mg via ORAL
  Filled 2019-03-07 (×3): qty 2

## 2019-03-07 MED ORDER — CALCIUM CARBONATE-VITAMIN D 500-200 MG-UNIT PO TABS
1.0000 | ORAL_TABLET | Freq: Every day | ORAL | Status: DC
  Administered 2019-03-07 – 2019-03-10 (×3): 1 via ORAL
  Filled 2019-03-07 (×4): qty 1

## 2019-03-07 MED ORDER — DOCUSATE SODIUM 100 MG PO CAPS
100.0000 mg | ORAL_CAPSULE | Freq: Two times a day (BID) | ORAL | Status: DC
  Administered 2019-03-07 – 2019-03-09 (×4): 100 mg via ORAL
  Filled 2019-03-07 (×5): qty 1

## 2019-03-07 MED ORDER — ASCORBIC ACID 500 MG PO TABS
500.0000 mg | ORAL_TABLET | Freq: Every day | ORAL | Status: DC
  Administered 2019-03-07 – 2019-03-10 (×4): 500 mg via ORAL
  Filled 2019-03-07 (×4): qty 1

## 2019-03-07 NOTE — Progress Notes (Signed)
Patient arrived from emergency department. Vital signs stable other than BP. Patient complaining of 10/10. MD paged. Patient placed on tele. Rn will continue to monitor.

## 2019-03-07 NOTE — ED Notes (Signed)
Patient is resting comfortably. 

## 2019-03-07 NOTE — Consult Note (Addendum)
Urology Inpatient Progress Report  Hydronephrosis [N13.30]  Intv/Subj: The patient is now resting more comfortably.  She is having less pain. A CT scan performed yesterday in the emergency department demonstrated new right hydroureteronephrosis despite the stent being in the appropriate position.  There is also a newly recognized lesion in her left inferior pubic ramus with associated pathologic fracture concerning for malignancy. The patient's labs are improving, her kidney function is near baseline.  Principal Problem:   UTI (urinary tract infection) due to urinary indwelling Foley catheter ([place]) Active Problems:   Sepsis ([place])   Acute kidney injury superimposed on chronic kidney disease ([facility])   Hydronephrosis   Multiple pathological fractures  Current Facility-Administered Medications  Medication Dose Route Frequency Provider Last Rate Last Admin  . 0.9 %  sodium chloride infusion   Intravenous Continuous Shela Leff, MD 75 mL/hr at 03/07/19 0440 Rate Change at 03/07/19 0440  . acetaminophen (OFIRMEV) IV 1,000 mg  1,000 mg Intravenous Q6H Ardis Hughs, MD      . cefTRIAXone (ROCEPHIN) 2 g in sodium chloride 0.9 % 100 mL IVPB  2 g Intravenous Q24H Shela Leff, MD   Stopped at 03/07/19 [phone]  . insulin aspart (novoLOG) injection 0-9 Units  0-9 Units Subcutaneous Q4H Shela Leff, MD      . ketorolac (TORADOL) 15 MG/ML injection 7.5 mg  7.5 mg Intravenous Q6H PRN Ardis Hughs, MD      . ondansetron [facility]) injection 4 mg  4 mg Intravenous Q6H PRN Shela Leff, MD       Current Outpatient Medications  Medication Sig Dispense Refill  . acetaminophen (TYLENOL) 500 MG tablet Take 1,000 mg by mouth every 8 (eight) hours as needed for moderate pain.     . Amino Acids-Protein Hydrolys (FEEDING SUPPLEMENT, PRO-STAT SUGAR FREE 64,) LIQD Take 30 mLs by mouth 3 (three) times daily with meals.    Marland Kitchen atorvastatin (LIPITOR) 40 MG tablet Take 40 mg by mouth  daily.    . calcium-vitamin D (OSCAL WITH D) 500-200 MG-UNIT per tablet Take 1 tablet by mouth daily. In the morning    . cephALEXin (KEFLEX) 250 MG capsule Take 2 capsules (500 mg total) by mouth daily. Give  nightly for suppressive Tx -No stop date (Patient taking differently: Take 250 mg by mouth daily. Give  nightly for suppressive Tx -No stop date ) 60 capsule 0  . clopidogrel (PLAVIX) 75 MG tablet Take 1 tablet (75 mg total) by mouth daily. *ANTIPLATELET * 30 tablet 0  . docusate sodium (COLACE) 100 MG capsule Take 100 mg by mouth 2 (two) times daily.    . ergocalciferol (VITAMIN D2) 1.25 MG (50000 UT) capsule Take 50,000 Units by mouth every 14 (fourteen) days.     . ferrous sulfate 325 (65 FE) MG tablet Take 2 tablets (650 mg total) by mouth every evening. 60 tablet 0  . furosemide (LASIX) 20 MG tablet TAKE 1 TABLET(20 MG) BY MOUTH DAILY (Patient taking differently: Take 20 mg by mouth daily. ) 30 tablet 0  . HYDROcodone-acetaminophen (NORCO/VICODIN) 5-325 MG tablet Take 1 tablet by mouth every 6 (six) hours as needed for moderate pain or severe pain. 30 tablet 0  . insulin detemir (LEVEMIR) 100 UNIT/ML injection Inject 0.06 mLs (6 Units total) into the skin at bedtime. (Patient taking differently: Inject 3 Units into the skin at bedtime. ) 10 mL 0  . insulin lispro (HUMALOG) 100 UNIT/ML injection Inject 2-10 Units into the skin 3 (three) times daily  before meals. 0-200= no coverage, 201-300= 4 units, 301-350= 6 units, 351-400=8 units, >400=10 units    . isosorbide mononitrate (IMDUR) 30 MG 24 hr tablet Take 1 tablet (30 mg total) by mouth daily. 30 tablet 0  . latanoprost (XALATAN) 0.005 % ophthalmic solution Place 1 drop into both eyes at bedtime. 2.5 mL 0  . levothyroxine (SYNTHROID) 150 MCG tablet Take 150 mcg by mouth daily before breakfast.    . Nutritional Supplements (NUTRITIONAL DRINK PO) Take 120 mLs by mouth 3 (three) times daily. Med Pass    . polyethylene glycol (MIRALAX /  GLYCOLAX) 17 g packet Take 17 g by mouth daily.    . sodium polystyrene (KAYEXALATE) 15 GM/60ML suspension Take 60 g by mouth daily as needed. Constipation    . tamsulosin (FLOMAX) 0.4 MG CAPS capsule Take 1 capsule (0.4 mg total) by mouth daily. 30 capsule 0  . vitamin B-12 (CYANOCOBALAMIN) 1000 MCG tablet Take 1 tablet (1,000 mcg total) by mouth every evening. 30 tablet 0  . vitamin C (ASCORBIC ACID) 500 MG tablet Take 500 mg by mouth at bedtime.     . Zinc Gluconate 50 MG CAPS Take 50 mg by mouth daily.    Marland Kitchen albuterol (VENTOLIN HFA) 108 (90 Base) MCG/ACT inhaler INHALE 2 PUFFS INTO THE LUNGS EVERY 6 HOURS AS NEEDED FOR WHEEZING OR SHORTNESS OF BREATH (Patient taking differently: Inhale 2 puffs into the lungs every 6 (six) hours as needed. FOR WHEEZING OR SHORTNESS OF BREATH) 54 g 0  . atorvastatin (LIPITOR) 80 MG tablet Take 1 tablet (80 mg total) by mouth daily. (Patient not taking: Reported on 03/07/2019) 90 tablet 1  . glucose blood (ONE TOUCH ULTRA TEST) test strip USE TO TEST BLOOD SUGAR 1 TO 2 TIMES DAILY AS DIRECTED 100 each 2  . insulin aspart (NOVOLOG FLEXPEN) 100 UNIT/ML FlexPen Inject 6 Units into the skin 3 (three) times daily with meals. (Patient not taking: Reported on 03/07/2019) 15 mL 0  . Insulin Pen Needle 32G X 4 MM MISC Use 4 times daily 400 each 1  . levothyroxine (SYNTHROID) 100 MCG tablet Take 1 tablet (100 mcg total) by mouth daily. (Patient not taking: Reported on 03/07/2019) 30 tablet 0     Objective: Vital: Vitals:   03/07/19 0715 03/07/19 0730 03/07/19 0745 03/07/19 0800  BP:    114/60  Pulse: 69 70 67 76  Resp: 16 16 17 17   Temp:      TempSrc:      SpO2: 99% 97% 99% 98%  Weight:       I/Os: No intake/output data recorded.  Physical Exam:  General: Patient is very cachectic, but oriented Lungs: On oxygen, but relaxed with normal respiratory effort, chest expands symmetrically. GI: Abdomen is soft, nontender Foley: Draining blood-tinged urine Ext:  lower extremities are very thin with heel ulcers which are wrapped  Lab Results: Recent Labs    03/02/2019 1718 03/07/19 0445  WBC 19.4* 14.6*  HGB 8.6* 7.5*  HCT 29.5* 26.0*   Recent Labs    02/09/2019 1718 03/07/19 0445  NA 140 140  K 4.2 3.5  CL 103 108  CO2 25 24  GLUCOSE 116* 97  BUN 75* 72*  CREATININE 1.72* 1.48*  CALCIUM 8.5* 7.6*   Recent Labs    02/14/2019 1718  INR 1.1   No results for input(s): LABURIN in the last 72 hours. Results for orders placed or performed during the hospital encounter of 12/12/18  Urine culture  Status: None   Collection Time: 12/12/18 11:48 AM   Specimen: Urine, Random  Result Value Ref Range Status   Specimen Description   Final    URINE, RANDOM Performed at [facility] [address], [place], [place] [address]    Special Requests   Final    NONE Performed at [facility], [place] [address] [place], [place] [address]    Culture   Final    NO GROWTH Performed at [facility], [place] [address] [place], [place] [address]    Report Status 12/13/2018 FINAL  Final    Studies/Results: DG Chest Port 1 View  Result Date: 02/19/2019 CLINICAL DATA:  Sepsis.  Congestive heart failure. EXAM: PORTABLE CHEST 1 VIEW COMPARISON:  02/12/2019 FINDINGS: Previous median sternotomy and CABG. Heart size is normal. Chronic aortic atherosclerosis. Right lung remains clear. Chronic pleural and parenchymal scarring the left lung base with pleural calcification. No sign of active infiltrate. No acute bone finding. IMPRESSION: No active disease. Previous CABG. Aortic atherosclerosis. Chronic pleural and parenchymal scarring at the left lung base with extensive pleural calcification. Electronically Signed   By: Nelson Chimes M.D.   On: 02/10/2019 20:24   CT Renal Stone Study  Result Date: 02/25/2019 CLINICAL DATA:  Abdominal and back pain for the past 3 weeks. EXAM: CT ABDOMEN AND PELVIS WITHOUT CONTRAST  TECHNIQUE: Multidetector CT imaging of the abdomen and pelvis was performed following the standard protocol without IV contrast. COMPARISON:  Renal ultrasound from same day. CT abdomen pelvis dated December 25, 2018. FINDINGS: Lower chest: No acute abnormality. Unchanged aneurysmal dilatation of the descending distal thoracic aorta measuring up to 4.2 cm. Unchanged left pleural calcifications. Hepatobiliary: No focal liver abnormality. Status post cholecystectomy. Unchanged pneumobilia. Unchanged mild common bile duct dilatation. Pancreas: Mildly atrophic. No ductal dilatation or surrounding inflammatory changes. Spleen: Normal in size without focal abnormality. Adrenals/Urinary Tract: The right adrenal gland is not clearly identified. Normal left adrenal gland. Unchanged severe left hydroureteronephrosis and renal cortical thinning. Unchanged right double-J ureteral stent. Slightly worsened moderate right hydronephrosis. The bladder is decompressed by Foley catheter. Stomach/Bowel: Unchanged small hiatal hernia. The stomach is otherwise within normal limits. No bowel wall thickening, distention, or surrounding inflammatory changes. Colonic diverticulosis. The appendix is not identified but there are no signs of inflammation at the base of the cecum. Vascular/Lymphatic: Extensive aortoiliac and branch vessel atherosclerosis. Prior aortobifemoral bypass graft. No enlarged abdominal or pelvic lymph nodes. Reproductive: Status post hysterectomy. No adnexal masses. Other: No free fluid or pneumoperitoneum. Unchanged diastasis of the right anterior abdominal wall. Musculoskeletal: New cortical irregularity and bony destruction of the right superior pubic ramus with associated minimally displaced acute pathologic fracture. New 2.4 x 1.1 cm mass with small soft tissue component involving the right inferior pubic ramus with associated nondisplaced acute pathologic fracture. Chronic T12 compression deformity. IMPRESSION: 1.  New cortical irregularity and bony destruction of the right pubic rami with pathologic fractures, concerning for underlying metastatic disease. 2. Slightly worsened moderate right hydronephrosis with unchanged right double-J ureteral stent. Unchanged severe left hydroureteronephrosis. 3. 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 4.  Aortic atherosclerosis (ICD10-I70.0). Electronically Signed   By: Titus Dubin M.D.   On: 02/22/2019 18:53    Assessment: The patient has new right-sided hydronephrosis, which was not present as recently as December 8 from a renal ultrasound performed at [facility].  The hydronephrosis appears to come down to the level of the  iliac graft, it is difficult to see if she has distention of her ureter beyond this.  Fortunately, her renal function is at or near her baseline.  Her catheter is in the appropriate position and is draining reasonably well.    The patient also has a newfound bone lesion in the left inferior ramus with a pathologic fracture concerning for malignancy.  The primary malignancy is unknown.  This area would be a difficult biopsy with low yield.  She also has not yet had a chest CT scan, and whether or not she has a primary lung cancer has not yet been ruled out.  I do have concerns about her urethra which appeared necrotic at the time of the catheter exchange.  Her vaginal mucosa was also very hard and fixed.  It is difficult to assess whether there is any vaginal lesions given her narrow introitus and the difficulty of the exam as well as the noncontrast CT scan.  I do have concerns that she may have a perivaginal malignancy.  She did have a hysterectomy 35 years ago, and according to the daughter this was done for a "floating mass".  She thinks that she had a radical hysterectomy that includes removal of her cervix.   Plan: The patient is on Plavix, which makes any intervention risky.  Given that her renal  function is normal and he is not complaining of any right-sided flank pain, I would recommend that she wait until it safe to perform a right nephrostomy tube.  At the time of nephrostomy tube placement, we would try to retrieve the stent.  Hopefully, this would then keep any urine from making it down into her bladder and we could remove her Foley catheter.  For now, we will leave her Foley catheter in place.  The chest CT has been ordered, we will follow this.  Ideally we would be able to obtain a PET CT scan, but this is a difficult test to order as an inpatient.  Otherwise, pain control should be a priority on this patient.  I would also consider a palliative care consult.  Would also suggest that her wounds be assessed and followed by wound care.  Given no urgent procedures are planned, she can eat, and I will resume her diet.   Louis Meckel, MD Urology 03/07/2019, 9:40 AM

## 2019-03-07 NOTE — Consult Note (Signed)
WOC Nurse Consult Note: Reason for Consult: Unstageable pressure injuries to bilateral heels and coccyx Wound type:unstageable pressure injuries to heels and coccyx   Pressure Injury POA: Yes Measurement: coccyx: 0.5 cm round full thickness tissue loss with slough to wound bed.  Left heel:  4 cm x 3 cm eschar to wound bed with peeling periwound epithelium  Some purulence noted. Old dressing from 12/29 in place.  Right heel:  3 cm x 2 cm eschar to wound bed.  Minimal serosanguinous drainage noted.  Wound ASU:ORVIFB Drainage (amount, consistency, odor) See above Periwound:erythema and peeling epithelium. Will implement topical dressing and offloading.  Dressing procedure/placement/frequency: Cleanse bilateral heels and coccyx with NS and pat dry.  Apply Aquacel Ag (LAWSON # [id number]) to wound bed and cover with dry dressing.  Change daily.  Prevalon boots to offload heels.  Turn and reposition every two hours.  Will not follow at this time.  Please re-consult if needed.  Domenic Moras MSN, RN, FNP-BC CWON Wound, Ostomy, Continence Nurse Pager [phone]

## 2019-03-07 NOTE — Progress Notes (Signed)
PROGRESS NOTE  JOYOUS [name] HKV:[id number] DOB: [birth year] DOA: 02/12/2019 PCP: [place], Betty G, MD  HPI/Recap of past 24 hours: HPI from Dr Illene Regulus Garceau is a 78 y.o. female with medical history significant of A. fib, AAA, CHF, CKD stage III, CAD status post CABG, GERD, history of GI bleed, hypertension, hyperlipidemia, hypothyroidism, PVD, type 2 diabetes presenting with complaints of suprapubic and right-sided buttock/pelvic pain.  She was sent to the ED by Dr. Louis Meckel from urology.  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 Her right kidney has been managed with stent exchanges, last one being in September.  Patient has not done well since her last stent exchange and has had numerous UTIs. Pt has declined over the past week.  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  She has also developed right-sided hydronephrosis which was found on ultrasound done at urology office.  Urology sent the patient to the ED to be admitted as she will benefit from having a nephrostomy tube in her R kidney as well as a suprapubic catheter given concern for urethral necrosis. Urology will follow. In the ED, CT renal stone study showing new cortical irregularity and bony destruction of the right pubic rami with pathologic fractures, concerning for underlying metastatic disease.  Slightly worsened moderate right hydronephrosis with unchanged right double-[name] ureteral stent. Patient received vancomycin, cefepime, and metronidazole in the ED, admitted for further management.     Today, saw patient in the ED with Dr. Louis Meckel, patient complaining about vaginal pain and have some many questions concerning her management, which were mostly answered by Dr. Louis Meckel.  Patient denies any chest pain, cough, shortness of breath, nausea/vomiting,  fever/chills.  Assessment/Plan: Principal Problem:   UTI (urinary tract infection) due to urinary indwelling Foley catheter (HCC) Active Problems:   Sepsis ([place])   Acute kidney injury superimposed on chronic kidney disease (HCC)   Hydronephrosis   Multiple pathological fractures  Sepsis likely 2/2 CAUTI Afebrile, with leukocytosis Lactic acidosis, resolved UA with large leukocytes, negative nitrites, many bacteria, greater than 50 WBC UC pending BC x2 pending Chest x-ray unremarkable Continue cefepime Daily CBC Monitor closely  Bilateral hydronephrosis 2/2 ureteral strictures Detailed history as above Left kidney nonfunctional CT showed new right hydroureteronephrosis despite stents being in the appropriate position Urology on board, recommend nephrostomy tube and right kidney, also placing a suprapubic catheter given concern urethral necrosis, procedures currently on hold due to patient being on Plavix Monitor renal function  Pathologic fractures/concern for metastatic disease Complaining of right-sided pelvic/buttock pain CT showing new cortical irregularity and bony destruction of the right pubic rami with pathologic fractures concerning for underlying metastatic disease CT chest pending, may need PET scan which will be done as an outpatient  CKD stage III Creatinine around baseline Daily BMP, monitor closely in light of hydronephrosis  Macrocytic anemia/anemia of chronic kidney disease Baseline hemoglobin 9-11 Vitamin B12 >7500, folate 5.8 Type and screen pending Daily CBC  Chronic systolic and diastolic HF Appears dry Last echo done in 2018 showed EF of 40 to 45%, diffuse hypokinesis, grade 1 diastolic dysfunction Status post IV fluids Monitor closely  Hypothyroidism Continue Synthroid  Diabetes mellitus type 2 Last A1c 4.9 SSI, Accu-Cheks, hypoglycemic protocol  Pressure ulcers Noted in the sacrum and ankle/heel Wound care consulted, appreciate  recs  Goals of care discussion Palliative  care consulted PT/OT           Malnutrition Type:      Malnutrition Characteristics:      Nutrition Interventions:       Estimated body mass index is 19.57 kg/m as calculated from the following:   Height as of 01/18/19: 5\' 6"  (1.676 m).   Weight as of this encounter: 55 kg.     Code Status: Full  Family Communication: None at bedside  Disposition Plan: To be determined   Consultants:  Urology Dr. Louis Meckel  Procedures:  None  Antimicrobials:  Cefepime  DVT prophylaxis: SCD due to upcoming intervention   Objective: Vitals:   03/07/19 0930 03/07/19 1000 03/07/19 1150 03/07/19 1354  BP: 118/68 (!) 116/59 (!) 116/59 (!) 132/114  Pulse: 78 76 61 (!) 43  Resp: 16 19 18 16   Temp:   (!) 97.1 F (36.2 C) 97.7 F (36.5 C)  TempSrc:   Oral Oral  SpO2: 98% 100% 100% 97%  Weight:        Intake/Output Summary (Last 24 hours) at 03/07/2019 1548 Last data filed at 03/07/2019 1500 Gross per 24 hour  Intake 1210 ml  Output --  Net 1210 ml   Filed Weights   02/11/2019 1644  Weight: 55 kg    Exam:  General: NAD, appears dry, frail, cachectic, chronically ill-appearing  Cardiovascular: S1, S2 present  Respiratory: CTAB  Abdomen: Soft, generalized abdominal tenderness/suprapubic tenderness, nondistended, bowel sounds present  Musculoskeletal: No bilateral pedal edema noted, bilateral noted pressure ulcers around heel, sacrum  Skin:  Pressure ulcers as above  Psychiatry: Normal mood   Data Reviewed: CBC: Recent Labs  Lab 02/15/2019 1718 03/07/19 0445  WBC 19.4* 14.6*  NEUTROABS 17.5*  --   HGB 8.6* 7.5*  HCT 29.5* 26.0*  MCV 103.5* 104.0*  PLT 502* 950*   Basic Metabolic Panel: Recent Labs  Lab 03/05/2019 1718 03/07/19 0445  NA 140 140  K 4.2 3.5  CL 103 108  CO2 25 24  GLUCOSE 116* 97  BUN 75* 72*  CREATININE 1.72* 1.48*  CALCIUM 8.5* 7.6*   GFR: Estimated Creatinine Clearance:  27.6 mL/min (A) (by C-G formula based on SCr of 1.48 mg/dL (H)). Liver Function Tests: No results for input(s): AST, ALT, ALKPHOS, BILITOT, PROT, ALBUMIN in the last 168 hours. No results for input(s): LIPASE, AMYLASE in the last 168 hours. No results for input(s): AMMONIA in the last 168 hours. Coagulation Profile: Recent Labs  Lab 02/06/2019 1718  INR 1.1   Cardiac Enzymes: No results for input(s): CKTOTAL, CKMB, CKMBINDEX, TROPONINI in the last 168 hours. BNP (last 3 results) No results for input(s): PROBNP in the last 8760 hours. HbA1C: Recent Labs    03/07/2019 2326  HGBA1C 4.9   CBG: Recent Labs  Lab 02/09/2019 2323 03/07/19 0443 03/07/19 0742 03/07/19 1154  GLUCAP 100* 91 96 87   Lipid Profile: No results for input(s): CHOL, HDL, LDLCALC, TRIG, CHOLHDL, LDLDIRECT in the last 72 hours. Thyroid Function Tests: No results for input(s): TSH, T4TOTAL, FREET4, T3FREE, THYROIDAB in the last 72 hours. Anemia Panel: Recent Labs    03/07/19 0445  VITAMINB12 >7,500*  FOLATE 5.8*  RETICCTPCT 2.0   Urine analysis:    Component Value Date/Time   COLORURINE AMBER (A) 02/12/2019 1957   APPEARANCEUR TURBID (A) 02/16/2019 1957   LABSPEC 1.014 03/05/2019 1957   PHURINE 5.0 03/08/2019 1957   GLUCOSEU NEGATIVE 02/18/2019 1957   GLUCOSEU NEGATIVE 09/04/2018 0833   HGBUR LARGE (A)  02/23/2019 [place] NEGATIVE 02/10/2019 [place] NEGATIVE 02/07/2019 1957   PROTEINUR 100 (A) 03/03/2019 1957   UROBILINOGEN 0.2 09/04/2018 0833   NITRITE NEGATIVE 02/15/2019 1957   LEUKOCYTESUR LARGE (A) 02/17/2019 1957   Sepsis Labs: @LABRCNTIP (procalcitonin:4,lacticidven:4)  ) Recent Results (from the past 240 hour(s))  Blood Culture (routine x 2)     Status: None (Preliminary result)   Collection Time: 02/19/2019  7:00 PM   Specimen: BLOOD  Result Value Ref Range Status   Specimen Description   Final    BLOOD LEFT ANTECUBITAL Performed at [facility],  [place] [address] [place], [place] [address]    Special Requests   Final    BOTTLES DRAWN AEROBIC AND ANAEROBIC Blood Culture adequate volume Performed at [facility] [address] [place], [place] [address]    Culture   Final    NO GROWTH < 24 HOURS Performed at [facility] [address], [address], [place] [address]    Report Status PENDING  Incomplete  Blood Culture (routine x 2)     Status: None (Preliminary result)   Collection Time: 02/17/2019  7:57 PM   Specimen: BLOOD  Result Value Ref Range Status   Specimen Description   Final    BLOOD RIGHT ANTECUBITAL Performed at [facility] [address] [place], [place] [address]    Special Requests   Final    BOTTLES DRAWN AEROBIC AND ANAEROBIC Blood Culture adequate volume Performed at [facility] [address], [place], [place] [address]    Culture   Final    NO GROWTH < 24 HOURS Performed at [facility] [address] [place], [place] [address]    Report Status PENDING  Incomplete      Studies: CT CHEST WO CONTRAST  Result Date: 03/07/2019 CLINICAL DATA:  78 year old female with UTI and sepsis. EXAM: CT CHEST WITHOUT CONTRAST TECHNIQUE: Multidetector CT imaging of the chest was performed following the standard protocol without IV contrast. COMPARISON:  Chest CT dated 08/28/2015. FINDINGS: Evaluation of this exam is limited in the absence of intravenous contrast. Cardiovascular: There is no cardiomegaly or pericardial effusion. 3 vessel coronary vascular calcification and postsurgical changes of CABG. There is advanced atherosclerotic calcification of the thoracic aorta. Similar appearance of aneurysmal dilatation of the descending thoracic aorta measuring up to 4.3 cm in greatest diameter. There is hypoattenuation of the cardiac blood pool suggestive of a degree of anemia. Clinical correlation is recommended. Mildly dilated main pulmonary trunk  suggestive of a degree of pulmonary hypertension. Mediastinum/Nodes: There is no hilar or mediastinal adenopathy. Small hiatal hernia. The esophagus is grossly unremarkable. No mediastinal fluid collection. Lungs/Pleura: Left sided talc pleurodesis similar to prior CT. Chronic right lung base pleural thickening/scarring. There is an 8 mm subpleural nodule in the right lower lobe, stable. Additional smaller nodules in the right upper lobe similar to prior CT. No new consolidation or pneumothorax. The central airways are patent. Upper Abdomen: Partially visualized chronic appearing left hydronephrosis. Small pocket of pneumobilia. Musculoskeletal: Median sternotomy wires. Degenerative changes of the spine. No acute osseous pathology. IMPRESSION: 1. No acute intrathoracic pathology. 2. 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 Follow-up in 1 year recommended. 3. Coronary vascular calcification and postsurgical changes of CABG. 4. Partially visualized chronic appearing left hydronephrosis. 5. Small hiatal hernia. 6. Aortic Atherosclerosis (ICD10-I70.0). Electronically Signed   By:  Anner Crete M.D.   On: 03/07/2019 10:17   DG Chest Port 1 View  Result Date: 03/07/2019 CLINICAL DATA:  Sepsis.  Congestive heart failure. EXAM: PORTABLE CHEST 1 VIEW COMPARISON:  02/12/2019 FINDINGS: Previous median sternotomy and CABG. Heart size is normal. Chronic aortic atherosclerosis. Right lung remains clear. Chronic pleural and parenchymal scarring the left lung base with pleural calcification. No sign of active infiltrate. No acute bone finding. IMPRESSION: No active disease. Previous CABG. Aortic atherosclerosis. Chronic pleural and parenchymal scarring at the left lung base with extensive pleural calcification. Electronically Signed   By: Nelson Chimes M.D.   On: 02/22/2019 20:24   CT Renal Stone Study  Result Date: 02/19/2019 CLINICAL DATA:  Abdominal and  back pain for the past 3 weeks. EXAM: CT ABDOMEN AND PELVIS WITHOUT CONTRAST TECHNIQUE: Multidetector CT imaging of the abdomen and pelvis was performed following the standard protocol without IV contrast. COMPARISON:  Renal ultrasound from same day. CT abdomen pelvis dated December 25, 2018. FINDINGS: Lower chest: No acute abnormality. Unchanged aneurysmal dilatation of the descending distal thoracic aorta measuring up to 4.2 cm. Unchanged left pleural calcifications. Hepatobiliary: No focal liver abnormality. Status post cholecystectomy. Unchanged pneumobilia. Unchanged mild common bile duct dilatation. Pancreas: Mildly atrophic. No ductal dilatation or surrounding inflammatory changes. Spleen: Normal in size without focal abnormality. Adrenals/Urinary Tract: The right adrenal gland is not clearly identified. Normal left adrenal gland. Unchanged severe left hydroureteronephrosis and renal cortical thinning. Unchanged right double-[name] ureteral stent. Slightly worsened moderate right hydronephrosis. The bladder is decompressed by Foley catheter. Stomach/Bowel: Unchanged small hiatal hernia. The stomach is otherwise within normal limits. No bowel wall thickening, distention, or surrounding inflammatory changes. Colonic diverticulosis. The appendix is not identified but there are no signs of inflammation at the base of the cecum. Vascular/Lymphatic: Extensive aortoiliac and branch vessel atherosclerosis. Prior aortobifemoral bypass graft. No enlarged abdominal or pelvic lymph nodes. Reproductive: Status post hysterectomy. No adnexal masses. Other: No free fluid or pneumoperitoneum. Unchanged diastasis of the right anterior abdominal wall. Musculoskeletal: New cortical irregularity and bony destruction of the right superior pubic ramus with associated minimally displaced acute pathologic fracture. New 2.4 x 1.1 cm mass with small soft tissue component involving the right inferior pubic ramus with associated nondisplaced  acute pathologic fracture. Chronic T12 compression deformity. IMPRESSION: 1. New cortical irregularity and bony destruction of the right pubic rami with pathologic fractures, concerning for underlying metastatic disease. 2. Slightly worsened moderate right hydronephrosis with unchanged right double-[name] ureteral stent. Unchanged severe left hydroureteronephrosis. 3. 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 4.  Aortic atherosclerosis (ICD10-I70.0). Electronically Signed   By: Titus Dubin M.D.   On: 02/27/2019 18:53    Scheduled Meds: . Chlorhexidine Gluconate Cloth  6 each Topical Daily  . insulin aspart  0-9 Units Subcutaneous Q4H    Continuous Infusions: . cefTRIAXone (ROCEPHIN)  IV Stopped (03/07/19 9758)     LOS: 1 day     Alma Friendly, MD [facility]  If 7PM-7AM, please contact night-coverage [email or web address] 03/07/2019, 3:48 PM

## 2019-03-07 NOTE — Progress Notes (Signed)
Pt asking for pocket book and cell phone. RN called Pt daughter, daughter did not take pt belongings home, no other visitors. Pt daughter stated pt also had her wheel chair and pillows with her. RN will contact ED and security to see if anything else was secured in their unit.

## 2019-03-08 DIAGNOSIS — L8990 Pressure ulcer of unspecified site, unspecified stage: Secondary | ICD-10-CM | POA: Insufficient documentation

## 2019-03-08 LAB — CBC WITH DIFFERENTIAL/PLATELET
Abs Immature Granulocytes: 0.22 10*3/uL — ABNORMAL HIGH (ref 0.00–0.07)
Basophils Absolute: 0.1 10*3/uL (ref 0.0–0.1)
Basophils Relative: 0 %
Eosinophils Absolute: 0.1 10*3/uL (ref 0.0–0.5)
Eosinophils Relative: 1 %
HCT: 25.5 % — ABNORMAL LOW (ref 36.0–46.0)
Hemoglobin: 7.4 g/dL — ABNORMAL LOW (ref 12.0–15.0)
Immature Granulocytes: 1 %
Lymphocytes Relative: 4 %
Lymphs Abs: 0.7 10*3/uL (ref 0.7–4.0)
MCH: 30.1 pg (ref 26.0–34.0)
MCHC: 29 g/dL — ABNORMAL LOW (ref 30.0–36.0)
MCV: 103.7 fL — ABNORMAL HIGH (ref 80.0–100.0)
Monocytes Absolute: 1 10*3/uL (ref 0.1–1.0)
Monocytes Relative: 6 %
Neutro Abs: 15.3 10*3/uL — ABNORMAL HIGH (ref 1.7–7.7)
Neutrophils Relative %: 88 %
Platelets: 407 10*3/uL — ABNORMAL HIGH (ref 150–400)
RBC: 2.46 MIL/uL — ABNORMAL LOW (ref 3.87–5.11)
RDW: 16.9 % — ABNORMAL HIGH (ref 11.5–15.5)
WBC: 17.4 10*3/uL — ABNORMAL HIGH (ref 4.0–10.5)
nRBC: 0 % (ref 0.0–0.2)

## 2019-03-08 LAB — BASIC METABOLIC PANEL
Anion gap: 11 (ref 5–15)
BUN: 60 mg/dL — ABNORMAL HIGH (ref 8–23)
CO2: 23 mmol/L (ref 22–32)
Calcium: 7.9 mg/dL — ABNORMAL LOW (ref 8.9–10.3)
Chloride: 107 mmol/L (ref 98–111)
Creatinine, Ser: 1.29 mg/dL — ABNORMAL HIGH (ref 0.44–1.00)
GFR calc Af Amer: 46 mL/min — ABNORMAL LOW (ref >60)
GFR calc non Af Amer: 40 mL/min — ABNORMAL LOW (ref >60)
Glucose, Bld: 81 mg/dL (ref 70–99)
Potassium: 4 mmol/L (ref 3.5–5.1)
Sodium: 141 mmol/L (ref 135–145)

## 2019-03-08 LAB — TYPE AND SCREEN
ABO/RH(D): O POS
Antibody Screen: POSITIVE
DAT, IgG: NEGATIVE

## 2019-03-08 LAB — GLUCOSE, CAPILLARY
Glucose-Capillary: 103 mg/dL — ABNORMAL HIGH (ref 70–99)
Glucose-Capillary: 110 mg/dL — ABNORMAL HIGH (ref 70–99)
Glucose-Capillary: 65 mg/dL — ABNORMAL LOW (ref 70–99)
Glucose-Capillary: 67 mg/dL — ABNORMAL LOW (ref 70–99)
Glucose-Capillary: 77 mg/dL (ref 70–99)
Glucose-Capillary: 81 mg/dL (ref 70–99)
Glucose-Capillary: 94 mg/dL (ref 70–99)
Glucose-Capillary: 95 mg/dL (ref 70–99)

## 2019-03-08 MED ORDER — ACETAMINOPHEN 10 MG/ML IV SOLN
1000.0000 mg | Freq: Four times a day (QID) | INTRAVENOUS | Status: AC | PRN
  Administered 2019-03-08 (×2): 1000 mg via INTRAVENOUS
  Filled 2019-03-08 (×2): qty 100

## 2019-03-08 MED ORDER — SODIUM CHLORIDE 0.9 % IV SOLN: INTRAVENOUS | Status: AC

## 2019-03-08 NOTE — Care Management Important Message (Signed)
Important Message  Patient Details IM Letter given to [facility] Case Manager to present to the Patient Name: [name] [name] MRN: [id number] Date of Birth: [birth year]   Medicare Important Message Given:  Yes     Kerin Salen 03/08/2019, 10:35 AM

## 2019-03-08 NOTE — Evaluation (Signed)
Clinical/Bedside Swallow Evaluation Patient Details  Name: [name] [name] MRN: [id number] Date of Birth: Apr 01, 1940  Today's Date: 03/08/2019 Time: SLP Start Time (ACUTE ONLY): 1003 SLP Stop Time (ACUTE ONLY): 1034 SLP Time Calculation (min) (ACUTE ONLY): 31 min  Past Medical History:  Past Medical History:  Diagnosis Date  . A-fib ([place])   . AAA (abdominal aortic aneurysm) ([place])   . Bilateral hydronephrosis   . Carotid artery stenosis    carotid doppler 06/2012 - Right CCA/Bulb/ICA with chronic occlusion; L vertebral artery with abnormal blood flow; L Bulb/Prox ICA  s/p endarterectomy with mild fibrous plaque, 50% diameter reduction  . CHF (congestive heart failure) ([place])   . Choledocholithiasis 2017  . CKD (chronic kidney disease), stage III   . Coronary artery disease due to lipid rich plaque cardiologist-  dr berry   s/p CABG x6 1997-- cath 12-09-2009 occluded vein to obtuse marginal branch and ramus branch with patent vien to PDA and patent LIMA to LAD, ef 40%-- Myoview 11-24-2011, nonischemic  . Diverticulosis   . Dyspnea on exertion   . GERD (gastroesophageal reflux disease)   . GI bleed   . History of sepsis    10-18-2014 w/ acute pyelonephritis  . Hyperlipidemia   . Hypertension   . Hypothyroidism   . Iron deficiency anemia   . Ischemic cardiomyopathy    03-25-2010-- per lasts echo EF  50-55%  . Ischemic cardiomyopathy   . PAD (peripheral artery disease) ([place])    09/2010 LEAs - R ABI of 0.45, occluded fem-pop bypass graft, L ABI of 0.59 with occluded SFA; severe arterial insuff  . PONV (postoperative nausea and vomiting)   . PVD (peripheral vascular disease) with claudication ([place])    last duplex 07-04-2015 -- Right CCA and ICA chronic occlusion, 40-98% LICA, Patent vertebral arteries w/ antegrade flow, bilateral normal subclavian arteries   . Retroperitoneal fibrosis   . Tubular adenoma of colon   . Type 2 diabetes mellitus ([place])    monitored by dr Dwyane Dee   Past  Surgical History:  Past Surgical History:  Procedure Laterality Date  . AORTA - BILATERAL FEMORAL ARTERY BYPASS GRAFT  1997   and RIGHT FEM-POP   . CARDIAC CATHETERIZATION  12-09-2009  dr al little   EF >40%-- occluded vein to OM & ramus branches, patent vein to PDA, patent LIMA to LAD (Dr. [name], [facility]) - later had thrombectomy of R fem-pop bypass ad R common femoral & profunda femoris artery (Dr. Oneida Alar)  . CARDIOVASCULAR STRESS TEST  11-24-2011   dr berry   Low Risk study: fixed basal to mid inferior attenuation artifact, no reversible ischemia,  normal LV function and wall motion , ef 67%  . CAROTID ENDARTERECTOMY Bilateral right 1994//  left ?  Marland Kitchen CATARACT EXTRACTION W/ INTRAOCULAR LENS  IMPLANT, BILATERAL  2006  . CHOLECYSTECTOMY N/A 09/05/2015   Procedure: ATTEMPTED LAPAROSCOPIC CHOLECYSTECTOMY, EXPLORATORY LAPAROTOMY WITH CHOLECYSTECTOMY;  Surgeon: Autumn Messing III, MD;  Location: [facility];  Service: General;  Laterality: N/A;  . COLONOSCOPY    . COLONOSCOPY  10/21/2015  . COLONOSCOPY WITH PROPOFOL N/A 10/21/2015   Procedure: COLONOSCOPY WITH PROPOFOL;  Surgeon: Milus Banister, MD;  Location: [facility];  Service: Endoscopy;  Laterality: N/A;  . CORONARY ARTERY BYPASS GRAFT  1997   x6; internal mammary to LAD, SVG to ramus #1 & #2, SVG to OM, SVG to PDA,   . CYSTOSCOPY W/ RETROGRADES Right 08/01/2015   Procedure: CYSTOSCOPY WITH RETROGRADE PYELOGRAM;  Surgeon: Viona Gilmore  Louis Meckel, MD;  Location: [facility];  Service: Urology;  Laterality: Right;  . CYSTOSCOPY W/ URETERAL STENT PLACEMENT  03/10/2012   Procedure: CYSTOSCOPY WITH RETROGRADE PYELOGRAM/URETERAL STENT PLACEMENT;  Surgeon: Hanley Ben, MD;  Location: [facility];  Service: Urology;  Laterality: Left;  . CYSTOSCOPY W/ URETERAL STENT PLACEMENT Bilateral 03/07/2015   Procedure: BILATERAL RETROGRADE PYELOGRAM AND RIGHT URETERAL STENT PLACEMENT;  Surgeon: Ardis Hughs, MD;  Location: [facility];   Service: Urology;  Laterality: Bilateral;  . CYSTOSCOPY W/ URETERAL STENT PLACEMENT Right 08/01/2015   Procedure: CYSTOSCOPY WITH STENT REPLACEMENT;  Surgeon: Ardis Hughs, MD;  Location: [facility];  Service: Urology;  Laterality: Right;  . CYSTOSCOPY W/ URETERAL STENT PLACEMENT Right 02/13/2016   Procedure: RIGHT URETERAL STENT EXCHANGE;  Surgeon: Ardis Hughs, MD;  Location: [facility];  Service: Urology;  Laterality: Right;  . CYSTOSCOPY W/ URETERAL STENT PLACEMENT Right 09/17/2016   Procedure: CYSTOSCOPY WITH RIGHT  RETROGRADE PYELOGRAM RIGHT URETERAL STENT EXCHANGE;  Surgeon: Ardis Hughs, MD;  Location: [facility];  Service: Urology;  Laterality: Right;  . CYSTOSCOPY W/ URETERAL STENT PLACEMENT Bilateral 03/18/2017   Procedure: CYSTOSCOPY WITH BILATERAL  RETROGRADE PYELOGRAM RIGHT URETERAL STENT [place];  Surgeon: Ardis Hughs, MD;  Location: [facility];  Service: Urology;  Laterality: Bilateral;  . CYSTOSCOPY W/ URETERAL STENT PLACEMENT Right 05/26/2018   Procedure: CYSTOSCOPY WITH RIGHT RETROGRADE PYELOGRAM RIGHT URETERAL STENT EXCHANGE;  Surgeon: Ardis Hughs, MD;  Location: [facility];  Service: Urology;  Laterality: Right;  . CYSTOSCOPY WITH STENT PLACEMENT Right 10/06/2017   Procedure: CYSTOSCOPY, RETROGRADE  WITH RIGHT STENT EXCHANGE;  Surgeon: Ardis Hughs, MD;  Location: [facility];  Service: Urology;  Laterality: Right;  . CYSTOSCOPY WITH STENT PLACEMENT Right 12/01/2018   Procedure: CYSTOSCOPY WITH STENT PLACEMENT;  Surgeon: Ardis Hughs, MD;  Location: [facility];  Service: Urology;  Laterality: Right;  . ERCP N/A 09/03/2015   Procedure: ENDOSCOPIC RETROGRADE CHOLANGIOPANCREATOGRAPHY (ERCP);  Surgeon: Irene Shipper, MD;  Location[facility]COPY;  Service: Endoscopy;  Laterality: N/A;  . ESOPHAGOGASTRODUODENOSCOPY    . ESOPHAGOGASTRODUODENOSCOPY N/A 10/19/2015   Procedure: ESOPHAGOGASTRODUODENOSCOPY (EGD);  Surgeon: Gatha Mayer, MD;   Location: [facility]OSCOPY;  Service: Endoscopy;  Laterality: N/A;  . GIVENS CAPSULE STUDY  10/21/2015  . GIVENS CAPSULE STUDY N/A 10/21/2015   Procedure: GIVENS CAPSULE STUDY;  Surgeon: Milus Banister, MD;  Location: [place];  Service: Endoscopy;  Laterality: N/A;  . KNEE SURGERY Right 03-01-2018-03-04-2018   PLATEAU FRACTURE HOSPITLIZATION   . REPAIR RIGHT FEMORAL PSEUDOANEUYSM/  RIGHT FEM-POP BYPASS GRAFT/  DEBRIDEMENT RIGHT LOWER EXTREMITIY VENOUS STATUS ULCERS X2  01-12-2005  . TOTAL ABDOMINAL HYSTERECTOMY W/ BILATERAL SALPINGOOPHORECTOMY  1986  . TRANSTHORACIC ECHOCARDIOGRAM  03/25/2010   EF 69-62%, LV systolic function low normal with mild inferoseptal hypocontractility; LA mildly dilated; mod MR; mild TR, RV systolic pressure elevated, mild pulm HTN; AV mildly sclerotic; mild pulm valve regurg; aortic root sclerosis/calcif    HPI:  Ms Jemia Fata, 78y/f, sent to ED b Dr. Louis Meckel from urology. 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 She has had nausea and vomitting for the past 3 days. Pt has a UTI. Chest CT showed no signs of PNA. No history of swallow problems. PMH of A-fib, AAA< CHF, CKD stage III, CAD, CABG, GERD, history of GI bleed, hypertension, hypothryroidism, PVD, type 2 diabetes.    Assessment / Plan /  Recommendation Clinical Impression  Clinical swallow evaluation completed as requested by MD. Pt is currently on Reg, thin liquid diet. Nursing reports last evening pt was inconsistently tolerating diet, however she tolerating her medication and sips of liquid this morning. Patient was alert and confused during session, only oriented to self, not place or reason for hospitalization. She required assistance to eat, however all trials (ice chips, water, puree and solids) were tolerated well. Likely patients difficulty noted last evening is related more by confusion in the setting of sepsis rather than  dysphagia. Recommend pt continue current diet with full assistance for meals and general aspiration precautions. No further skill ST services are needed at this time.  SLP Visit Diagnosis: Dysphagia, unspecified (R13.10)    Aspiration Risk  Mild aspiration risk    Diet Recommendation Regular;Thin liquid   Liquid Administration via: Cup;Straw Medication Administration: Whole meds with puree Supervision: Staff to assist with self feeding Compensations: Minimize environmental distractions;Slow rate;Small sips/bites Postural Changes: Seated upright at 90 degrees    Other  Recommendations Oral Care Recommendations: Oral care BID   Follow up Recommendations None      Frequency and Duration            Prognosis Prognosis for Safe Diet Advancement: Good      Swallow Study   General Date of Onset: 02/13/2019 HPI: Ms Ronniesha Seibold, 78y/f, sent to ED b Dr. Louis Meckel from urology. 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 She has had nausea and vomitting for the past 3 days. Pt has a UTI. Chest CT showed no signs of PNA. No history of swallow problems. PMH of A-fib, AAA< CHF, CKD stage III, CAD, CABG, GERD, history of GI bleed, hypertension, hypothryroidism, PVD, type 2 diabetes.  Type of Study: Bedside Swallow Evaluation Previous Swallow Assessment: none Diet Prior to this Study: Regular;Thin liquids Temperature Spikes Noted: No Respiratory Status: Room air History of Recent Intubation: No Behavior/Cognition: Alert;Confused Oral Cavity Assessment: Within Functional Limits Oral Care Completed by SLP: Yes Oral Cavity - Dentition: Adequate natural dentition Vision: Functional for self-feeding Self-Feeding Abilities: Needs assist Patient Positioning: Upright in bed Baseline Vocal Quality: Normal Volitional Cough: Cognitively unable to elicit Volitional Swallow: Unable to elicit    Oral/Motor/Sensory Function  Overall Oral Motor/Sensory Function: Within functional limits   Ice Chips Ice chips: Within functional limits Presentation: Spoon   Thin Liquid Thin Liquid: Within functional limits Presentation: Cup;Straw    Nectar Thick Nectar Thick Liquid: Not tested   Honey Thick Honey Thick Liquid: Not tested   Puree Puree: Within functional limits Presentation: Spoon   Solid     Solid: Within functional limits Presentation: Corky Downs., MA, CCC-SLP 03/08/2019,10:47 AM

## 2019-03-08 NOTE — Consult Note (Signed)
Urology Inpatient Progress Report  Hydronephrosis [N13.30] Lower urinary tract infectious disease [N39.0] Pubic ramus fracture, right, closed, initial encounter ([place]) [S32.591A] Sepsis, due to unspecified organism, unspecified whether acute organ dysfunction present ([place]) [A41.9]  Intv/Subj: Pain seems better controlled, with more complaints of back pain and vaginal pain today.  The patient is extremely confused, but oriented.  After 10 minutes in the room with her she became reestablished and oriented to her surroundings and circumstance.  CT scan demonstrated no evidence of metastatic lung cancer.  Has been working with PT, OT, and speech therapy.  Principal Problem:   UTI (urinary tract infection) due to urinary indwelling Foley catheter ([facility]) Active Problems:   Sepsis ([place])   Acute kidney injury superimposed on chronic kidney disease ([place])   Bilateral hydronephrosis   Multiple pathological fractures   Pressure injury of skin  Current Facility-Administered Medications  Medication Dose Route Frequency Provider Last Rate Last Admin  . 0.9 %  sodium chloride infusion   Intravenous Continuous Alma Friendly, MD 75 mL/hr at 03/08/19 0852 New Bag at 03/08/19 4540  . acetaminophen (OFIRMEV) IV 1,000 mg  1,000 mg Intravenous Q6H PRN Alma Friendly, MD 400 mL/hr at 03/08/19 0938 1,000 mg at 03/08/19 9811  . acetaminophen (TYLENOL) tablet 650 mg  650 mg Oral Q6H PRN Alma Friendly, MD      . ascorbic acid (VITAMIN C) tablet 500 mg  500 mg Oral QHS Alma Friendly, MD   500 mg at 03/07/19 2103  . calcium-vitamin D (OSCAL WITH D) 500-200 MG-UNIT per tablet 1 tablet  1 tablet Oral Daily Alma Friendly, MD   1 tablet at 03/08/19 0930  . cefTRIAXone (ROCEPHIN) 2 g in sodium chloride 0.9 % 100 mL IVPB  2 g Intravenous Q24H Shela Leff, MD 200 mL/hr at 03/08/19 0556 2 g at 03/08/19 0556  . Chlorhexidine Gluconate Cloth 2 % PADS 6 each  6 each Topical Daily  Alma Friendly, MD   6 each at 03/08/19 1317  . docusate sodium (COLACE) capsule 100 mg  100 mg Oral BID Alma Friendly, MD   100 mg at 03/08/19 0930  . ferrous sulfate tablet 650 mg  650 mg Oral QPM Alma Friendly, MD   650 mg at 03/07/19 1712  . HYDROcodone-acetaminophen (NORCO/VICODIN) 5-325 MG per tablet 1-2 tablet  1-2 tablet Oral Q4H PRN Alma Friendly, MD   1 tablet at 03/08/19 0405  . insulin aspart (novoLOG) injection 0-9 Units  0-9 Units Subcutaneous TID WC Alma Friendly, MD      . insulin detemir (LEVEMIR) injection 3 Units  3 Units Subcutaneous QHS Alma Friendly, MD   3 Units at 03/07/19 2104  . levothyroxine (SYNTHROID) tablet 150 mcg  150 mcg Oral Q0600 Alma Friendly, MD   150 mcg at 03/08/19 0553  . ondansetron (ZOFRAN) injection 4 mg  4 mg Intravenous Q6H PRN Shela Leff, MD      . polyethylene glycol (MIRALAX / GLYCOLAX) packet 17 g  17 g Oral Daily Alma Friendly, MD   17 g at 03/08/19 0932  . tamsulosin (FLOMAX) capsule 0.4 mg  0.4 mg Oral Daily Alma Friendly, MD   0.4 mg at 03/08/19 0930     Objective: Vital: Vitals:   03/07/19 1730 03/07/19 2005 03/08/19 0503 03/08/19 1325  BP: (!) 120/58  125/66 (!) 104/44  Pulse: (!) 58 (!) 56 96 68  Resp: 16 13 15 16   Temp:  97.7 F (36.5 C) 97.9 F (36.6 C) 98.1 F (36.7 C) 98.3 F (36.8 C)  TempSrc: Oral Oral Oral Oral  SpO2:  94% 100% 100%  Weight: 55 kg     Height: 5' 5.98" (1.676 m)      I/Os: I/O last 3 completed shifts: In: 1210 [I.V.:1010; IV Piggyback:200] Out: 350 [Urine:350]  Physical Exam:  General: Patient is very cachectic and disoriented Lungs: On oxygen, but relaxed with normal respiratory effort, chest expands symmetrically. GI: Abdomen is soft, nontender Foley: Draining blood-tinged urine Ext: lower extremities are very thin with heel ulcers which are wrapped  Lab Results: Recent Labs    02/27/2019 1718 03/07/19 0445 03/08/19 0634   WBC 19.4* 14.6* 17.4*  HGB 8.6* 7.5* 7.4*  HCT 29.5* 26.0* 25.5*   Recent Labs    03/02/2019 1718 03/07/19 0445 03/08/19 0634  NA 140 140 141  K 4.2 3.5 4.0  CL 103 108 107  CO2 25 24 23   GLUCOSE 116* 97 81  BUN 75* 72* 60*  CREATININE 1.72* 1.48* 1.29*  CALCIUM 8.5* 7.6* 7.9*   Recent Labs    02/21/2019 1718  INR 1.1   No results for input(s): LABURIN in the last 72 hours. Results for orders placed or performed during the hospital encounter of 02/19/2019  Blood Culture (routine x 2)     Status: None (Preliminary result)   Collection Time: 02/17/2019  7:00 PM   Specimen: BLOOD  Result Value Ref Range Status   Specimen Description   Final    BLOOD LEFT ANTECUBITAL Performed at [facility] [address], [place], [place] [address]    Special Requests   Final    BOTTLES DRAWN AEROBIC AND ANAEROBIC Blood Culture adequate volume Performed at [facility] [address], [place], [place] [address]    Culture   Final    NO GROWTH 1 DAY Performed at [facility], [place] [address], [place], [place] [address]    Report Status PENDING  Incomplete  Urine culture     Status: Abnormal   Collection Time: 02/22/2019  7:57 PM   Specimen: Urine, Clean Catch  Result Value Ref Range Status   Specimen Description   Final    URINE, CLEAN CATCH Performed at [facility], [place] [address], [place], [place] [address]    Special Requests   Final    NONE Performed at [facility], [place] [address], [place], [place] [address]    Culture MULTIPLE SPECIES PRESENT, SUGGEST RECOLLECTION (A)  Final   Report Status 03/07/2019 FINAL  Final  Blood Culture (routine x 2)     Status: None (Preliminary result)   Collection Time: 02/08/2019  7:57 PM   Specimen: BLOOD  Result Value Ref Range Status   Specimen Description   Final    BLOOD RIGHT ANTECUBITAL Performed at [facility] [address]  [place], [place] [address]    Special Requests   Final    BOTTLES DRAWN AEROBIC AND ANAEROBIC Blood Culture adequate volume Performed at [facility] [address] [place],  [address]    Culture  Setup Time   Final    AEROBIC BOTTLE ONLY GRAM POSITIVE COCCI CRITICAL RESULT CALLED TO, READ BACK BY AND VERIFIED WITH:  Lavell Luster [facility] 03/08/19 0105 JDW    Culture   Final    NO GROWTH 1 DAY Performed at [facility], [place]  [address] [place], [place] [address]    Report Status PENDING  Incomplete  SARS CORONAVIRUS 2 (TAT 6-24 HRS) Nasopharyngeal Nasopharyngeal Swab     Status: None   Collection Time: 03/07/19 10:27 AM   Specimen: Nasopharyngeal Swab  Result Value Ref Range Status   SARS Coronavirus 2 NEGATIVE NEGATIVE Final    Comment: (NOTE) SARS-CoV-2 target nucleic acids are NOT DETECTED. The SARS-CoV-2 RNA is generally detectable in upper and lower respiratory specimens during the acute phase of infection. Negative results do not preclude SARS-CoV-2 infection, do not rule out co-infections with other pathogens, and should not be used as the sole basis for treatment or other patient management decisions. Negative results must be combined with clinical observations, patient history, and epidemiological information. The expected result is Negative. Fact Sheet for Patients: SugarRoll.be Fact Sheet for Healthcare Providers: https://www.woods-mathews.com/ This test is not yet approved or cleared by the [place] FDA and  has been authorized for detection and/or diagnosis of SARS-CoV-2 by FDA under an Emergency Use Authorization (EUA). This EUA will remain  in effect (meaning this test can be used) for the duration of the COVID-19 declaration under Section 56 4(b)(1) of the Act, 21 U.S.C. section 360bbb-3(b)(1), unless the authorization is terminated or revoked sooner. Performed at [facility],  [place] [address], [place], [place] [address]   MRSA PCR Screening     Status: None   Collection Time: 03/07/19  3:52 PM   Specimen: Nasal Mucosa; Nasopharyngeal  Result Value Ref Range Status   MRSA by PCR NEGATIVE NEGATIVE Final    Comment:        The GeneXpert MRSA Assay (FDA approved for NASAL specimens only), is one component of a comprehensive MRSA colonization surveillance program. It is not intended to diagnose MRSA infection nor to guide or monitor treatment for MRSA infections. Performed at [facility], [place] [address] [place], [place] [address]     Studies/Results: CT CHEST WO CONTRAST  Result Date: 03/07/2019 CLINICAL DATA:  78 year old female with UTI and sepsis. EXAM: CT CHEST WITHOUT CONTRAST TECHNIQUE: Multidetector CT imaging of the chest was performed following the standard protocol without IV contrast. COMPARISON:  Chest CT dated 08/28/2015. FINDINGS: Evaluation of this exam is limited in the absence of intravenous contrast. Cardiovascular: There is no cardiomegaly or pericardial effusion. 3 vessel coronary vascular calcification and postsurgical changes of CABG. There is advanced atherosclerotic calcification of the thoracic aorta. Similar appearance of aneurysmal dilatation of the descending thoracic aorta measuring up to 4.3 cm in greatest diameter. There is hypoattenuation of the cardiac blood pool suggestive of a degree of anemia. Clinical correlation is recommended. Mildly dilated main pulmonary trunk suggestive of a degree of pulmonary hypertension. Mediastinum/Nodes: There is no hilar or mediastinal adenopathy. Small hiatal hernia. The esophagus is grossly unremarkable. No mediastinal fluid collection. Lungs/Pleura: Left sided talc pleurodesis similar to prior CT. Chronic right lung base pleural thickening/scarring. There is an 8 mm subpleural nodule in the right lower lobe, stable. Additional smaller nodules in the right upper lobe similar to prior  CT. No new consolidation or pneumothorax. The central airways are patent. Upper Abdomen: Partially visualized chronic appearing left hydronephrosis. Small pocket of pneumobilia. Musculoskeletal: Median sternotomy wires. Degenerative changes of the spine. No acute osseous pathology. IMPRESSION: 1. No acute intrathoracic pathology. 2. 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 Follow-up in 1 year recommended. 3. Coronary vascular calcification and postsurgical changes of CABG. 4.  Partially visualized chronic appearing left hydronephrosis. 5. Small hiatal hernia. 6. Aortic Atherosclerosis (ICD10-I70.0). Electronically Signed   By: Anner Crete M.D.   On: 03/07/2019 10:17   DG Chest Port 1 View  Result Date: 02/12/2019 CLINICAL DATA:  Sepsis.  Congestive heart failure. EXAM: PORTABLE CHEST 1 VIEW COMPARISON:  02/12/2019 FINDINGS: Previous median sternotomy and CABG. Heart size is normal. Chronic aortic atherosclerosis. Right lung remains clear. Chronic pleural and parenchymal scarring the left lung base with pleural calcification. No sign of active infiltrate. No acute bone finding. IMPRESSION: No active disease. Previous CABG. Aortic atherosclerosis. Chronic pleural and parenchymal scarring at the left lung base with extensive pleural calcification. Electronically Signed   By: Nelson Chimes M.D.   On: 02/09/2019 20:24   CT Renal Stone Study  Result Date: 02/16/2019 CLINICAL DATA:  Abdominal and back pain for the past 3 weeks. EXAM: CT ABDOMEN AND PELVIS WITHOUT CONTRAST TECHNIQUE: Multidetector CT imaging of the abdomen and pelvis was performed following the standard protocol without IV contrast. COMPARISON:  Renal ultrasound from same day. CT abdomen pelvis dated December 25, 2018. FINDINGS: Lower chest: No acute abnormality. Unchanged aneurysmal dilatation of the descending distal thoracic aorta measuring up to 4.2 cm. Unchanged left pleural  calcifications. Hepatobiliary: No focal liver abnormality. Status post cholecystectomy. Unchanged pneumobilia. Unchanged mild common bile duct dilatation. Pancreas: Mildly atrophic. No ductal dilatation or surrounding inflammatory changes. Spleen: Normal in size without focal abnormality. Adrenals/Urinary Tract: The right adrenal gland is not clearly identified. Normal left adrenal gland. Unchanged severe left hydroureteronephrosis and renal cortical thinning. Unchanged right double-J ureteral stent. Slightly worsened moderate right hydronephrosis. The bladder is decompressed by Foley catheter. Stomach/Bowel: Unchanged small hiatal hernia. The stomach is otherwise within normal limits. No bowel wall thickening, distention, or surrounding inflammatory changes. Colonic diverticulosis. The appendix is not identified but there are no signs of inflammation at the base of the cecum. Vascular/Lymphatic: Extensive aortoiliac and branch vessel atherosclerosis. Prior aortobifemoral bypass graft. No enlarged abdominal or pelvic lymph nodes. Reproductive: Status post hysterectomy. No adnexal masses. Other: No free fluid or pneumoperitoneum. Unchanged diastasis of the right anterior abdominal wall. Musculoskeletal: New cortical irregularity and bony destruction of the right superior pubic ramus with associated minimally displaced acute pathologic fracture. New 2.4 x 1.1 cm mass with small soft tissue component involving the right inferior pubic ramus with associated nondisplaced acute pathologic fracture. Chronic T12 compression deformity. IMPRESSION: 1. New cortical irregularity and bony destruction of the right pubic rami with pathologic fractures, concerning for underlying metastatic disease. 2. Slightly worsened moderate right hydronephrosis with unchanged right double-J ureteral stent. Unchanged severe left hydroureteronephrosis. 3. 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 4.  Aortic  atherosclerosis (ICD10-I70.0). Electronically Signed   By: Titus Dubin M.D.   On: 02/24/2019 18:53    Assessment: The patient has new right-sided hydronephrosis, which was not present as recently as December 8 from a renal ultrasound performed [facility].  The hydronephrosis appears to come down to the level of the iliac graft, it is difficult to see if she has distention of her ureter beyond this.  Fortunately, her renal function is at or near her baseline.  Her catheter is in the appropriate position and is draining reasonably well.    The patient also has a newfound bone lesion in the left inferior ramus with a pathologic fracture concerning for malignancy.  The primary malignancy is unknown.  This area would be a difficult biopsy with  low yield.   I do have concerns about her urethra which appeared necrotic at the time of the catheter exchange.  Her vaginal mucosa was also very hard and fixed.  It is difficult to assess whether there is any vaginal lesions given her narrow introitus and the difficulty of the exam as well as the noncontrast CT scan.  I do have concerns that she may have a perivaginal malignancy.  She did have a hysterectomy 35 years ago, and according to the daughter this was done for a "floating mass".  She thinks that she had a radical hysterectomy that includes removal of her cervix.  Lung CT demonstrates no evidence of primary lung cancer.  Patient has worsening leukocytosis, possibly from the urinary tract, would consider other sources including her pressure ulcers as potential source of infection.  Patient continues to be cachectic and profoundly deconditioned and malnourished.    Plan: When safe would recommend the patient have a right nephrostomy tube placed and the stent removed.  I have discussed with gynecologic the feasibility of performing a pelvic exam and a potential vaginal wall biopsy if indicated.  This may be too much for her to go  through at this time, and to be quite frank she would not be able to tolerate any systemic therapy for this.  Perhaps a PET CT scan would be best and most appropriate, but cannot be obtained while the patient is inpatient.  Over the holiday, we will follow from [place], please contact the on-call urologist if any additional questions or concerns arise.  I have updated the patient's daughter today at 5:00 PM.  Louis Meckel, MD Urology 03/08/2019, 5:13 PM

## 2019-03-08 NOTE — Progress Notes (Signed)
PROGRESS NOTE  [name] [name] NIO:[id number] DOB: 1940-04-20 DOA: 02/22/2019 PCP: [place], Betty G, MD  HPI/Recap of past 24 hours: HPI from Dr Illene Regulus [name] is a 78 y.o. female with medical history significant of A. fib, AAA, CHF, CKD stage III, CAD status post CABG, GERD, history of GI bleed, hypertension, hyperlipidemia, hypothyroidism, PVD, type 2 diabetes presenting with complaints of suprapubic and right-sided buttock/pelvic pain.  She was sent to the ED by Dr. Louis Meckel from urology.  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 Her right kidney has been managed with stent exchanges, last one being in September.  Patient has not done well since her last stent exchange and has had numerous UTIs. Pt has declined over the past week.  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  She has also developed right-sided hydronephrosis which was found on ultrasound done at urology office.  Urology sent the patient to the ED to be admitted as she will benefit from having a nephrostomy tube in her R kidney as well as a suprapubic catheter given concern for urethral necrosis. Urology will follow. In the ED, CT renal stone study showing new cortical irregularity and bony destruction of the right pubic rami with pathologic fractures, concerning for underlying metastatic disease.  Slightly worsened moderate right hydronephrosis with unchanged right double-J ureteral stent. Patient received vancomycin, cefepime, and metronidazole in the ED, admitted for further management.     Today, patient noted to be in significant vaginal pain, denied any other complaints, no chest pain, no abdominal pain, nausea/vomiting, fever/chills.     Assessment/Plan: Principal Problem:   UTI (urinary tract infection) due to urinary indwelling Foley  catheter (HCC) Active Problems:   Sepsis ([place])   Acute kidney injury superimposed on chronic kidney disease ([place])   Bilateral hydronephrosis   Multiple pathological fractures   Pressure injury of skin  Sepsis likely 2/2 ??CAUTI Afebrile, with leukocytosis Lactic acidosis, resolved UA with large leukocytes, negative nitrites, many bacteria, greater than 50 WBC UC showed multiple species, suggests recollection BC x2 with 1/4 bottles growing gram-positive cocci, await final culture Chest x-ray unremarkable Continue cefepime Daily CBC Monitor closely  Bilateral hydronephrosis 2/2 ureteral strictures Detailed history as above Left kidney nonfunctional CT showed new right hydroureteronephrosis despite stents being in the appropriate position Urology on board, recommend nephrostomy tube and right kidney, also placing a suprapubic catheter given concern urethral necrosis, procedures currently on hold due to patient being on Plavix Continue gentle hydration Monitor renal function  Pathologic fractures/concern for metastatic disease Complaining of right-sided pelvic/buttock pain CT showing new cortical irregularity and bony destruction of the right pubic rami with pathologic fractures concerning for underlying metastatic disease CT chest showed no acute intrathoracic pathology Urology suspecting possible gynecologic malignancy given history of floating mass in her uterus status post hysterectomy 35 years ago  May need PET scan which will be done as an outpatient  CKD stage III Creatinine around baseline Daily BMP, monitor closely in light of hydronephrosis  Macrocytic anemia/anemia of chronic kidney disease Baseline hemoglobin 9-11 Vitamin B12 >7500, folate 5.8 Type and screen pending Daily CBC  Chronic systolic and diastolic HF Appears dry Last echo done in 2018 showed EF of 40 to 45%, diffuse hypokinesis, grade 1 diastolic dysfunction Monitor closely while on gentle  hydration  Hypothyroidism Continue Synthroid  Diabetes mellitus  type 2 Last A1c 4.9 SSI, Accu-Cheks, hypoglycemic protocol  Pressure ulcers Noted in the sacrum and ankle/heel Wound care consulted, appreciate recs  Goals of care discussion Palliative care consulted, poor prognosis/deconditioned/cachectic PT/OT Nutrition consult           Malnutrition Type:      Malnutrition Characteristics:      Nutrition Interventions:       Estimated body mass index is 19.58 kg/m as calculated from the following:   Height as of this encounter: 5' 5.98" (1.676 m).   Weight as of this encounter: 55 kg.     Code Status: Full  Family Communication: None at bedside  Disposition Plan: To be determined   Consultants:  Urology Dr. Louis Meckel  Procedures:  None  Antimicrobials:  Cefepime  DVT prophylaxis: SCD due to upcoming intervention   Objective: Vitals:   03/07/19 1730 03/07/19 2005 03/08/19 0503 03/08/19 1325  BP: (!) 120/58  125/66 (!) 104/44  Pulse: (!) 58 (!) 56 96 68  Resp: 16 13 15 16   Temp: 97.7 F (36.5 C) 97.9 F (36.6 C) 98.1 F (36.7 C) 98.3 F (36.8 C)  TempSrc: Oral Oral Oral Oral  SpO2:  94% 100% 100%  Weight: 55 kg     Height: 5' 5.98" (1.676 m)       Intake/Output Summary (Last 24 hours) at 03/08/2019 1756 Last data filed at 03/08/2019 1749 Gross per 24 hour  Intake 846.38 ml  Output 650 ml  Net 196.38 ml   Filed Weights   02/08/2019 1644 03/07/19 1730  Weight: 55 kg 55 kg    Exam:  General: NAD, appears dry, frail, cachectic, chronically ill-appearing  Cardiovascular: S1, S2 present  Respiratory: CTAB  Abdomen: Soft, suprapubic tenderness, nondistended, bowel sounds present  Musculoskeletal: No bilateral pedal edema noted, bilateral noted pressure ulcers around heel, sacrum  Skin:  Pressure ulcers as above  Psychiatry: Normal mood   Data Reviewed: CBC: Recent Labs  Lab 03/08/2019 1718 03/07/19 0445  03/08/19 0634  WBC 19.4* 14.6* 17.4*  NEUTROABS 17.5*  --  15.3*  HGB 8.6* 7.5* 7.4*  HCT 29.5* 26.0* 25.5*  MCV 103.5* 104.0* 103.7*  PLT 502* 416* 782*   Basic Metabolic Panel: Recent Labs  Lab 03/03/2019 1718 03/07/19 0445 03/08/19 0634  NA 140 140 141  K 4.2 3.5 4.0  CL 103 108 107  CO2 25 24 23   GLUCOSE 116* 97 81  BUN 75* 72* 60*  CREATININE 1.72* 1.48* 1.29*  CALCIUM 8.5* 7.6* 7.9*   GFR: Estimated Creatinine Clearance: 31.7 mL/min (A) (by C-G formula based on SCr of 1.29 mg/dL (H)). Liver Function Tests: No results for input(s): AST, ALT, ALKPHOS, BILITOT, PROT, ALBUMIN in the last 168 hours. No results for input(s): LIPASE, AMYLASE in the last 168 hours. No results for input(s): AMMONIA in the last 168 hours. Coagulation Profile: Recent Labs  Lab 03/03/2019 1718  INR 1.1   Cardiac Enzymes: No results for input(s): CKTOTAL, CKMB, CKMBINDEX, TROPONINI in the last 168 hours. BNP (last 3 results) No results for input(s): PROBNP in the last 8760 hours. HbA1C: Recent Labs    03/01/2019 2326  HGBA1C 4.9   CBG: Recent Labs  Lab 03/08/19 0508 03/08/19 0802 03/08/19 0848 03/08/19 1211 03/08/19 1714  GLUCAP 77 65* 110* 95 81   Lipid Profile: No results for input(s): CHOL, HDL, LDLCALC, TRIG, CHOLHDL, LDLDIRECT in the last 72 hours. Thyroid Function Tests: No results for input(s): TSH, T4TOTAL, FREET4, T3FREE, THYROIDAB in the last  72 hours. Anemia Panel: Recent Labs    03/07/19 0445  VITAMINB12 >7,500*  FOLATE 5.8*  FERRITIN 1,342*  IRON 45  RETICCTPCT 2.0   Urine analysis:    Component Value Date/Time   COLORURINE AMBER (A) 02/23/2019 1957   APPEARANCEUR TURBID (A) 02/25/2019 1957   LABSPEC 1.014 02/17/2019 1957   PHURINE 5.0 02/21/2019 1957   GLUCOSEU NEGATIVE 03/08/2019 1957   GLUCOSEU NEGATIVE 09/04/2018 0833   HGBUR LARGE (A) 02/24/2019 1957   BILIRUBINUR NEGATIVE 02/12/2019 [place] NEGATIVE 02/19/2019 1957   PROTEINUR 100 (A)  02/18/2019 1957   UROBILINOGEN 0.2 09/04/2018 0833   NITRITE NEGATIVE 02/24/2019 1957   LEUKOCYTESUR LARGE (A) 02/28/2019 1957   Sepsis Labs: @LABRCNTIP (procalcitonin:4,lacticidven:4)  ) Recent Results (from the past 240 hour(s))  Blood Culture (routine x 2)     Status: None (Preliminary result)   Collection Time: 02/27/2019  7:00 PM   Specimen: BLOOD  Result Value Ref Range Status   Specimen Description   Final    BLOOD LEFT ANTECUBITAL Performed at [facility], [place] [address] [place], [place] [address]    Special Requests   Final    BOTTLES DRAWN AEROBIC AND ANAEROBIC Blood Culture adequate volume Performed at [facility] [address] [place], [place] [address]    Culture   Final    NO GROWTH 1 DAY Performed at [facility], [place] [address] [place], [place] [address]    Report Status PENDING  Incomplete  Urine culture     Status: Abnormal   Collection Time: 03/05/2019  7:57 PM   Specimen: Urine, Clean Catch  Result Value Ref Range Status   Specimen Description   Final    URINE, CLEAN CATCH Performed at [facility], [place] [address], [place], [place] [address]    Special Requests   Final    NONE Performed at [facility], [place] [address], [place], [place] [address]    Culture MULTIPLE SPECIES PRESENT, SUGGEST RECOLLECTION (A)  Final   Report Status 03/07/2019 FINAL  Final  Blood Culture (routine x 2)     Status: None (Preliminary result)   Collection Time: 03/02/2019  7:57 PM   Specimen: BLOOD  Result Value Ref Range Status   Specimen Description   Final    BLOOD RIGHT ANTECUBITAL Performed at [facility] [address] [place], [place] [address]    Special Requests   Final    BOTTLES DRAWN AEROBIC AND ANAEROBIC Blood Culture adequate volume Performed at [place] [address], [place], [place] [address]    Culture  Setup Time   Final     AEROBIC BOTTLE ONLY GRAM POSITIVE COCCI CRITICAL RESULT CALLED TO, READ BACK BY AND VERIFIED WITH:  Lavell Luster [facility] 03/08/19 0105 JDW    Culture   Final    NO GROWTH 1 DAY Performed at [facility], [place] [address], [place],  [address]    Report Status PENDING  Incomplete  SARS CORONAVIRUS 2 (TAT 6-24 HRS) Nasopharyngeal Nasopharyngeal Swab     Status: None   Collection Time: 03/07/19 10:27 AM   Specimen: Nasopharyngeal Swab  Result Value Ref Range Status   SARS Coronavirus 2 NEGATIVE NEGATIVE Final    Comment: (NOTE) SARS-CoV-2 target nucleic acids are NOT DETECTED. The SARS-CoV-2 RNA is generally detectable in upper and lower respiratory specimens during the acute phase of infection. Negative results do not preclude  SARS-CoV-2 infection, do not rule out co-infections with other pathogens, and should not be used as the sole basis for treatment or other patient management decisions. Negative results must be combined with clinical observations, patient history, and epidemiological information. The expected result is Negative. Fact Sheet for Patients: SugarRoll.be Fact Sheet for Healthcare Providers: https://www.woods-mathews.com/ This test is not yet approved or cleared by the [place] FDA and  has been authorized for detection and/or diagnosis of SARS-CoV-2 by FDA under an Emergency Use Authorization (EUA). This EUA will remain  in effect (meaning this test can be used) for the duration of the COVID-19 declaration under Section 56 4(b)(1) of the Act, 21 U.S.C. section 360bbb-3(b)(1), unless the authorization is terminated or revoked sooner. Performed at [facility], [place] [address] [place], [place] [address]   MRSA PCR Screening     Status: None   Collection Time: 03/07/19  3:52 PM   Specimen: Nasal Mucosa; Nasopharyngeal  Result Value Ref Range Status   MRSA by PCR NEGATIVE NEGATIVE Final    Comment:         The GeneXpert MRSA Assay (FDA approved for NASAL specimens only), is one component of a comprehensive MRSA colonization surveillance program. It is not intended to diagnose MRSA infection nor to guide or monitor treatment for MRSA infections. Performed at [facility], [place] [address] [place], [place] [address]       Studies: No results found.  Scheduled Meds: . vitamin C  500 mg Oral QHS  . calcium-vitamin D  1 tablet Oral Daily  . Chlorhexidine Gluconate Cloth  6 each Topical Daily  . docusate sodium  100 mg Oral BID  . ferrous sulfate  650 mg Oral QPM  . insulin aspart  0-9 Units Subcutaneous TID WC  . insulin detemir  3 Units Subcutaneous QHS  . levothyroxine  150 mcg Oral Q0600  . polyethylene glycol  17 g Oral Daily  . tamsulosin  0.4 mg Oral Daily    Continuous Infusions: . sodium chloride 75 mL/hr at 03/08/19 0852  . acetaminophen 1,000 mg (03/08/19 1744)  . cefTRIAXone (ROCEPHIN)  IV 2 g (03/08/19 0556)     LOS: 2 days     Alma Friendly, MD [facility]  If 7PM-7AM, please contact night-coverage [email or web address] 03/08/2019, 5:56 PM

## 2019-03-08 NOTE — Progress Notes (Addendum)
Hypoglycemic Event  CBG: 67  Treatment: 4 oz juice/soda  Symptoms: None  Follow-up CBG: Time: 0127 CBG Result: 94  Possible Reasons for Event: Inadequate meal intake and Change in activity  Comments/MD notified:On Call [place], [place]

## 2019-03-08 NOTE — Evaluation (Signed)
Occupational Therapy Evaluation Patient Details Name: [name] [name] MRN: [id number] DOB: May 23, 1940 Today's Date: 03/08/2019    History of Present Illness "78 y.o. female with medical history significant of A. fib, AAA, CHF, CKD stage III, CAD status post CABG, GERD, history of GI bleed, hypertension, hyperlipidemia, hypothyroidism, PVD, type 2 diabetes presenting with complaints of suprapubic and right-sided buttock/pelvic pain.  She was sent to the ED by Dr. Louis Meckel from urology.  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 Her right kidney has been managed with stent exchanges, last one being in September.  Patient has not done well since her last stent exchange and has had numerous UTIs. Pt has declined over the past week.  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  She has also developed right-sided hydronephrosis which was found on ultrasound done at urology office.  Urology sent the patient to the ED to be admitted as she will benefit from having a nephrostomy tube in her R kidney as well as a suprapubic catheter given concern for urethral necrosis. Urology will follow. In the ED, CT renal stone study showing new cortical irregularity and bony destruction of the right pubic rami with pathologic fractures, concerning for underlying metastatic disease."   Clinical Impression   Pt is from an ALF and got up to w/c per notes.  She participated in [place] and rolled at time of eval. Will follow in acute setting working on sitting balance/tolerance as precursor for functional transfers and for adls.  Will update and add additional goals as more information about PLOF can be obtained    Follow Up Recommendations  (back to ALF if they can provide needed care vs snf)    Equipment Recommendations  None recommended by OT     Recommendations for Other Services       Precautions / Restrictions Precautions Precautions: Fall Restrictions Weight Bearing Restrictions: No      Mobility Bed Mobility Overal bed mobility: Needs Assistance Bed Mobility: Supine to Sit;Sit to Supine;Rolling Rolling: Min assist   Supine to sit: Min assist Sit to supine: Min assist   General bed mobility comments: to bil sides  Transfers                      Balance Overall balance assessment: Needs assistance Sitting-balance support: Bilateral upper extremity supported;Feet supported Sitting balance-Leahy Scale: Poor Sitting balance - Comments: requires UE support                                   ADL either performed or assessed with clinical judgement   ADL Overall ADL's : Needs assistance/impaired Eating/Feeding: Set up Eating/Feeding Details (indicate cue type and reason): beverage; has not been eating per nursing Grooming: Minimal assistance;Oral care;Bed level   Upper Body Bathing: Minimal assistance;Bed level   Lower Body Bathing: Total assistance   Upper Body Dressing : Minimal assistance   Lower Body Dressing: Total assistance                 General ADL Comments: pt seen at bed level, drank water, brushed teeth and combed hair and donned gown.  Rolled in bed bil, but did not mobilize beyond this due to pain     Vision  Perception     Praxis      Pertinent Vitals/Pain Pain Assessment: Faces Faces Pain Scale: Hurts even more Pain Location: back Pain Descriptors / Indicators: Grimacing;Moaning Pain Intervention(s): Limited activity within patient's tolerance;Monitored during session;Repositioned     Hand Dominance Right   Extremity/Trunk Assessment Upper Extremity Assessment Upper Extremity Assessment: Generalized weakness   Lower Extremity Assessment Lower Extremity Assessment: Generalized weakness       Communication  Communication Communication: No difficulties   Cognition Arousal/Alertness: Awake/alert Behavior During Therapy: WFL for tasks assessed/performed Overall Cognitive Status: No family/caregiver present to determine baseline cognitive functioning                                 General Comments: oriented to self and follows one step commands. Kept asking about her daughter   General Comments       Exercises     Shoulder Instructions      Home Living Family/patient expects to be discharged to:: Assisted living                                 Additional Comments: poor historian; per chart, pt got up to w/c      Prior Functioning/Environment Level of Independence: Needs assistance  Gait / Transfers Assistance Needed: w/c mobility     Comments: unsure of how much adl assistance pt needed        OT Problem List: Decreased strength;Decreased activity tolerance;Pain;Decreased cognition      OT Treatment/Interventions: Self-care/ADL training;Therapeutic exercise;Balance training;Patient/family education;Therapeutic activities;Cognitive remediation/compensation    OT Goals(Current goals can be found in the care plan section) Acute Rehab OT Goals OT Goal Formulation: Patient unable to participate in goal setting Time For Goal Achievement: 03/22/19 Potential to Achieve Goals: Good ADL Goals Additional ADL Goal #1: pt will sit eob for 3 minutes in preparation for functional transfers and for adls with mod A for balance  OT Frequency: Min 2X/week   Barriers to D/C:            Co-evaluation              AM-PAC OT "6 Clicks" Daily Activity     Outcome Measure Help from another person eating meals?: A Little Help from another person taking care of personal grooming?: A Little Help from another person toileting, which includes using toliet, bedpan, or urinal?: Total Help from another person bathing (including washing, rinsing, drying)?: A  Lot Help from another person to put on and taking off regular upper body clothing?: A Little Help from another person to put on and taking off regular lower body clothing?: Total 6 Click Score: 13   End of Session    Activity Tolerance: Patient limited by pain Patient left: in bed;with call bell/phone within reach;with bed alarm set  OT Visit Diagnosis: Muscle weakness (generalized) (M62.81)                Time: 7867-6720 OT Time Calculation (min): 16 min Charges:  OT General Charges $OT Visit: 1 Visit OT Evaluation $OT Eval Low Complexity: 1 Low  Ellionna Buckbee S, OTR/L Acute Rehabilitation Services 03/08/2019  [place] 03/08/2019, 2:14 PM

## 2019-03-08 NOTE — Evaluation (Signed)
Physical Therapy Evaluation Patient Details Name: [name] [name] MRN: [id number] DOB: [birth year] Today's Date: 03/08/2019   History of Present Illness  "78 y.o. female with medical history significant of A. fib, AAA, CHF, CKD stage III, CAD status post CABG, GERD, history of GI bleed, hypertension, hyperlipidemia, hypothyroidism, PVD, type 2 diabetes presenting with complaints of suprapubic and right-sided buttock/pelvic pain.  She was sent to the ED by Dr. Louis Meckel from urology.  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 Her right kidney has been managed with stent exchanges, last one being in September.  Patient has not done well since her last stent exchange and has had numerous UTIs. Pt has declined over the past week.  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  She has also developed right-sided hydronephrosis which was found on ultrasound done at urology office.  Urology sent the patient to the ED to be admitted as she will benefit from having a nephrostomy tube in her R kidney as well as a suprapubic catheter given concern for urethral necrosis. Urology will follow. In the ED, CT renal stone study showing new cortical irregularity and bony destruction of the right pubic rami with pathologic fractures, concerning for underlying metastatic disease."  Clinical Impression  Pt admitted with above diagnosis.  Pt currently with functional limitations due to the deficits listed below (see PT Problem List). Pt will benefit from skilled PT to increase their independence and safety with mobility to allow discharge to the venue listed below.  Pt restless and attempting to get OOB on arrival.  Pt assisted to sitting EOB however quickly fatigued and assisted back to bed.  RN into room to assist as pt appears slightly confused.  Pt from  facility, uncertain of mobility baseline however has w/c stored in hospital bathroom per RN.     Follow Up Recommendations SNF    Equipment Recommendations  None recommended by PT    Recommendations for Other Services       Precautions / Restrictions Precautions Precautions: Fall      Mobility  Bed Mobility Overal bed mobility: Needs Assistance Bed Mobility: Supine to Sit;Sit to Supine;Rolling Rolling: Min assist   Supine to sit: Min assist Sit to supine: Min assist   General bed mobility comments: assist for upper body upright and LEs onto bed; pt fatigued quickly with sitting and requested to return to supine; rolling for changing bed pads and RN tended to sacral dressing  Transfers                    Ambulation/Gait                Stairs            Wheelchair Mobility    Modified Rankin (Stroke Patients Only)       Balance Overall balance assessment: Needs assistance Sitting-balance support: Bilateral upper extremity supported;Feet supported Sitting balance-Leahy Scale: Poor Sitting balance - Comments: requires UE support                                     Pertinent Vitals/Pain Pain Assessment: Faces Pain Score: 5  Faces Pain Scale: Hurts little more Pain Location: groin area and pressure injury sites Pain Descriptors / Indicators: Grimacing;Moaning Pain Intervention(s):  Repositioned;Monitored during session;Premedicated before session    Home Living Family/patient expects to be discharged to:: Assisted living                 Additional Comments: from a facility, pt poor historian    Prior Function Level of Independence: Needs assistance   Gait / Transfers Assistance Needed: w/c mobility           Hand Dominance        Extremity/Trunk Assessment   Upper Extremity Assessment Upper Extremity Assessment: Generalized weakness    Lower Extremity Assessment Lower Extremity Assessment:  Generalized weakness       Communication   Communication: No difficulties  Cognition Arousal/Alertness: Awake/alert Behavior During Therapy: Restless Overall Cognitive Status: No family/caregiver present to determine baseline cognitive functioning                                 General Comments: pt asking to leave and to talk with daughter, appeared restless on arrival however improved with repositioning and RN in room, uncertain of baseline; following simple commands      General Comments      Exercises     Assessment/Plan    PT Assessment Patient needs continued PT services  PT Problem List Decreased strength;Decreased mobility;Decreased balance;Decreased activity tolerance;Decreased cognition;Decreased safety awareness;Decreased knowledge of use of DME       PT Treatment Interventions DME instruction;Therapeutic activities;Therapeutic exercise;Patient/family education;Functional mobility training;Balance training;Wheelchair mobility training    PT Goals (Current goals can be found in the Care Plan section)  Acute Rehab PT Goals PT Goal Formulation: Patient unable to participate in goal setting Time For Goal Achievement: 03/22/19 Potential to Achieve Goals: Fair    Frequency Min 2X/week   Barriers to discharge        Co-evaluation               AM-PAC PT "6 Clicks" Mobility  Outcome Measure Help needed turning from your back to your side while in a flat bed without using bedrails?: A Little Help needed moving from lying on your back to sitting on the side of a flat bed without using bedrails?: A Little Help needed moving to and from a bed to a chair (including a wheelchair)?: A Lot Help needed standing up from a chair using your arms (e.g., wheelchair or bedside chair)?: A Lot Help needed to walk in hospital room?: Total Help needed climbing 3-5 steps with a railing? : Total 6 Click Score: 12    End of Session   Activity Tolerance: Patient  tolerated treatment well Patient left: in bed;with call bell/phone within reach;with bed alarm set;with nursing/sitter in room Nurse Communication: Mobility status PT Visit Diagnosis: Other abnormalities of gait and mobility (R26.89);Muscle weakness (generalized) (M62.81)    Time: 4944-9675 PT Time Calculation (min) (ACUTE ONLY): 14 min   Charges:   PT Evaluation $PT Eval Low Complexity: 1 Low     Kati PT, DPT Acute Rehabilitation Services Office: [phone]  York Ram E 03/08/2019, 1:49 PM

## 2019-03-08 NOTE — Progress Notes (Signed)
PHARMACY - PHYSICIAN COMMUNICATION CRITICAL VALUE ALERT - BLOOD CULTURE IDENTIFICATION (BCID)  [name] [name] is an 78 y.o. female who presented to [facility] on 02/28/2019 with a chief complaint of Back and Abdominal pain  Assessment:  Patient on 2gm iv ceftriaxone q24hr for UTI  1 out of 4 bottles produced GPC on gram stain.  (include suspected source if known)  Name of physician (or Provider) Contacted: M. Sharlet Salina NP  Current antibiotics: Ceftriaxone 2gm iv q24hr  Changes to prescribed antibiotics recommended:  No change to antibiotics at this time.  NP agree to no changes at this time and will await final result of culture.  No results found for this or any previous visit.  [name] 03/08/2019  5:03 AM

## 2019-03-09 ENCOUNTER — Inpatient Hospital Stay (HOSPITAL_COMMUNITY): Payer: Medicare Other

## 2019-03-09 DIAGNOSIS — A419 Sepsis, unspecified organism: Secondary | ICD-10-CM

## 2019-03-09 DIAGNOSIS — N179 Acute kidney failure, unspecified: Secondary | ICD-10-CM

## 2019-03-09 DIAGNOSIS — N1330 Unspecified hydronephrosis: Secondary | ICD-10-CM

## 2019-03-09 DIAGNOSIS — N189 Chronic kidney disease, unspecified: Secondary | ICD-10-CM

## 2019-03-09 DIAGNOSIS — T83511A Infection and inflammatory reaction due to indwelling urethral catheter, initial encounter: Principal | ICD-10-CM

## 2019-03-09 DIAGNOSIS — N390 Urinary tract infection, site not specified: Secondary | ICD-10-CM

## 2019-03-09 LAB — GLUCOSE, CAPILLARY
Glucose-Capillary: 100 mg/dL — ABNORMAL HIGH (ref 70–99)
Glucose-Capillary: 192 mg/dL — ABNORMAL HIGH (ref 70–99)
Glucose-Capillary: 204 mg/dL — ABNORMAL HIGH (ref 70–99)
Glucose-Capillary: 69 mg/dL — ABNORMAL LOW (ref 70–99)
Glucose-Capillary: 79 mg/dL (ref 70–99)
Glucose-Capillary: 82 mg/dL (ref 70–99)
Glucose-Capillary: 84 mg/dL (ref 70–99)
Glucose-Capillary: 86 mg/dL (ref 70–99)

## 2019-03-09 LAB — CBC WITH DIFFERENTIAL/PLATELET
Abs Immature Granulocytes: 0.18 10*3/uL — ABNORMAL HIGH (ref 0.00–0.07)
Abs Immature Granulocytes: 0.9 10*3/uL — ABNORMAL HIGH (ref 0.00–0.07)
Basophils Absolute: 0.1 10*3/uL (ref 0.0–0.1)
Basophils Absolute: 0.1 10*3/uL (ref 0.0–0.1)
Basophils Relative: 0 %
Basophils Relative: 0 %
Eosinophils Absolute: 0.1 10*3/uL (ref 0.0–0.5)
Eosinophils Absolute: 0.1 10*3/uL (ref 0.0–0.5)
Eosinophils Relative: 0 %
Eosinophils Relative: 0 %
HCT: 25.5 % — ABNORMAL LOW (ref 36.0–46.0)
HCT: 29.1 % — ABNORMAL LOW (ref 36.0–46.0)
Hemoglobin: 7.2 g/dL — ABNORMAL LOW (ref 12.0–15.0)
Hemoglobin: 8.1 g/dL — ABNORMAL LOW (ref 12.0–15.0)
Immature Granulocytes: 1 %
Immature Granulocytes: 3 %
Lymphocytes Relative: 4 %
Lymphocytes Relative: 16 %
Lymphs Abs: 0.8 10*3/uL (ref 0.7–4.0)
Lymphs Abs: 4.4 10*3/uL — ABNORMAL HIGH (ref 0.7–4.0)
MCH: 30.1 pg (ref 26.0–34.0)
MCH: 30.8 pg (ref 26.0–34.0)
MCHC: 28.2 g/dL — ABNORMAL LOW (ref 30.0–36.0)
MCHC: 27.8 g/dL — ABNORMAL LOW (ref 30.0–36.0)
MCV: 106.7 fL — ABNORMAL HIGH (ref 80.0–100.0)
MCV: 110.6 fL — ABNORMAL HIGH (ref 80.0–100.0)
Monocytes Absolute: 1 10*3/uL (ref 0.1–1.0)
Monocytes Absolute: 1.3 10*3/uL — ABNORMAL HIGH (ref 0.1–1.0)
Monocytes Relative: 5 %
Monocytes Relative: 5 %
Neutro Abs: 16.3 10*3/uL — ABNORMAL HIGH (ref 1.7–7.7)
Neutro Abs: 20 10*3/uL — ABNORMAL HIGH (ref 1.7–7.7)
Neutrophils Relative %: 90 %
Neutrophils Relative %: 76 %
Platelets: 412 10*3/uL — ABNORMAL HIGH (ref 150–400)
Platelets: 504 10*3/uL — ABNORMAL HIGH (ref 150–400)
RBC: 2.39 MIL/uL — ABNORMAL LOW (ref 3.87–5.11)
RBC: 2.63 MIL/uL — ABNORMAL LOW (ref 3.87–5.11)
RDW: 17.4 % — ABNORMAL HIGH (ref 11.5–15.5)
RDW: 17.4 % — ABNORMAL HIGH (ref 11.5–15.5)
WBC: 18.4 10*3/uL — ABNORMAL HIGH (ref 4.0–10.5)
WBC: 26.8 10*3/uL — ABNORMAL HIGH (ref 4.0–10.5)
nRBC: 0 % (ref 0.0–0.2)
nRBC: 0.1 % (ref 0.0–0.2)

## 2019-03-09 LAB — BASIC METABOLIC PANEL
Anion gap: 7 (ref 5–15)
Anion gap: 11 (ref 5–15)
BUN: 50 mg/dL — ABNORMAL HIGH (ref 8–23)
BUN: 47 mg/dL — ABNORMAL HIGH (ref 8–23)
CO2: 22 mmol/L (ref 22–32)
CO2: 20 mmol/L — ABNORMAL LOW (ref 22–32)
Calcium: 7.9 mg/dL — ABNORMAL LOW (ref 8.9–10.3)
Calcium: 8 mg/dL — ABNORMAL LOW (ref 8.9–10.3)
Chloride: 113 mmol/L — ABNORMAL HIGH (ref 98–111)
Chloride: 111 mmol/L (ref 98–111)
Creatinine, Ser: 1.29 mg/dL — ABNORMAL HIGH (ref 0.44–1.00)
Creatinine, Ser: 1.35 mg/dL — ABNORMAL HIGH (ref 0.44–1.00)
GFR calc Af Amer: 46 mL/min — ABNORMAL LOW (ref >60)
GFR calc Af Amer: 44 mL/min — ABNORMAL LOW (ref >60)
GFR calc non Af Amer: 40 mL/min — ABNORMAL LOW (ref >60)
GFR calc non Af Amer: 38 mL/min — ABNORMAL LOW (ref >60)
Glucose, Bld: 101 mg/dL — ABNORMAL HIGH (ref 70–99)
Glucose, Bld: 132 mg/dL — ABNORMAL HIGH (ref 70–99)
Potassium: 3.7 mmol/L (ref 3.5–5.1)
Potassium: 3.8 mmol/L (ref 3.5–5.1)
Sodium: 142 mmol/L (ref 135–145)
Sodium: 142 mmol/L (ref 135–145)

## 2019-03-09 LAB — BLOOD GAS, ARTERIAL
Acid-base deficit: 6.5 mmol/L — ABNORMAL HIGH (ref 0.0–2.0)
Acid-base deficit: 8.7 mmol/L — ABNORMAL HIGH (ref 0.0–2.0)
Bicarbonate: 18.7 mmol/L — ABNORMAL LOW (ref 20.0–28.0)
Bicarbonate: 17.2 mmol/L — ABNORMAL LOW (ref 20.0–28.0)
Drawn by: 51425
FIO2: 100
FIO2: 40
MECHVT: 450 mL
O2 Saturation: 99.8 %
O2 Saturation: 87.2 %
PEEP: 5 cmH2O
Patient temperature: 98.6
Patient temperature: 97.8
RATE: 16 resp/min
pCO2 arterial: 37.8 mmHg (ref 32.0–48.0)
pCO2 arterial: 38.5 mmHg (ref 32.0–48.0)
pH, Arterial: 7.314 — ABNORMAL LOW (ref 7.350–7.450)
pH, Arterial: 7.27 — ABNORMAL LOW (ref 7.350–7.450)
pO2, Arterial: 520 mmHg — ABNORMAL HIGH (ref 83.0–108.0)
pO2, Arterial: 57.7 mmHg — ABNORMAL LOW (ref 83.0–108.0)

## 2019-03-09 LAB — MAGNESIUM
Magnesium: 2.2 mg/dL (ref 1.7–2.4)
Magnesium: 1.8 mg/dL (ref 1.7–2.4)

## 2019-03-09 LAB — PHOSPHORUS
Phosphorus: 3.8 mg/dL (ref 2.5–4.6)
Phosphorus: 3.5 mg/dL (ref 2.5–4.6)

## 2019-03-09 LAB — HEPATIC FUNCTION PANEL
ALT: 37 U/L (ref 0–44)
AST: 104 U/L — ABNORMAL HIGH (ref 15–41)
Albumin: 1.4 g/dL — ABNORMAL LOW (ref 3.5–5.0)
Alkaline Phosphatase: 153 U/L — ABNORMAL HIGH (ref 38–126)
Bilirubin, Direct: <0.1 mg/dL (ref 0.0–0.2)
Total Bilirubin: 0.3 mg/dL (ref 0.3–1.2)
Total Protein: 5 g/dL — ABNORMAL LOW (ref 6.5–8.1)

## 2019-03-09 LAB — APTT: aPTT: 64 seconds — ABNORMAL HIGH (ref 24–36)

## 2019-03-09 LAB — PROTIME-INR
INR: 1.4 — ABNORMAL HIGH (ref 0.8–1.2)
Prothrombin Time: 17 seconds — ABNORMAL HIGH (ref 11.4–15.2)

## 2019-03-09 MED ORDER — NOREPINEPHRINE 4 MG/250ML-% IV SOLN
INTRAVENOUS | Status: AC
  Administered 2019-03-09: 18:00:00 10 ug/min via INTRAVENOUS
  Filled 2019-03-09: qty 250

## 2019-03-09 MED ORDER — SODIUM CHLORIDE 0.9% FLUSH: 10.0000 mL | INTRAVENOUS | Status: DC | PRN

## 2019-03-09 MED ORDER — NOREPINEPHRINE 4 MG/250ML-% IV SOLN
0.0000 ug/min | INTRAVENOUS | Status: DC
  Administered 2019-03-09: 22:00:00 30 ug/min via INTRAVENOUS
  Administered 2019-03-10: 06:00:00 17 ug/min via INTRAVENOUS
  Administered 2019-03-10: 18 ug/min via INTRAVENOUS
  Filled 2019-03-09 (×3): qty 250

## 2019-03-09 MED ORDER — FENTANYL CITRATE (PF) 100 MCG/2ML IJ SOLN
INTRAMUSCULAR | Status: AC
  Filled 2019-03-09: qty 2

## 2019-03-09 MED ORDER — MIDAZOLAM HCL 2 MG/2ML IJ SOLN
INTRAMUSCULAR | Status: AC
  Administered 2019-03-09: 2 mg
  Filled 2019-03-09: qty 2

## 2019-03-09 MED ORDER — MAGNESIUM SULFATE 2 GM/50ML IV SOLN
2.0000 g | Freq: Once | INTRAVENOUS | Status: AC
  Administered 2019-03-09: 20:00:00 2 g via INTRAVENOUS
  Filled 2019-03-09: qty 50

## 2019-03-09 MED ORDER — SODIUM CHLORIDE 0.9 % IV SOLN
250.0000 mL | INTRAVENOUS | Status: DC
  Administered 2019-03-09: 22:00:00 250 mL via INTRAVENOUS

## 2019-03-09 MED ORDER — SODIUM CHLORIDE 0.9 % IV BOLUS
500.0000 mL | Freq: Once | INTRAVENOUS | Status: AC
  Administered 2019-03-09: 22:00:00 500 mL via INTRAVENOUS

## 2019-03-09 MED ORDER — SODIUM CHLORIDE 0.9 % IV BOLUS
250.0000 mL | Freq: Once | INTRAVENOUS | Status: AC
  Administered 2019-03-09: 14:00:00 250 mL via INTRAVENOUS

## 2019-03-09 MED ORDER — INSULIN ASPART 100 UNIT/ML ~~LOC~~ SOLN
0.0000 [IU] | SUBCUTANEOUS | Status: DC
  Administered 2019-03-09: 3 [IU] via SUBCUTANEOUS
  Administered 2019-03-09: 20:00:00 2 [IU] via SUBCUTANEOUS
  Administered 2019-03-10: 1 [IU] via SUBCUTANEOUS
  Administered 2019-03-10 (×5): 2 [IU] via SUBCUTANEOUS
  Administered 2019-03-11: 03:00:00 3 [IU] via SUBCUTANEOUS
  Administered 2019-03-11: 09:00:00 2 [IU] via SUBCUTANEOUS

## 2019-03-09 MED ORDER — MIDAZOLAM HCL 2 MG/2ML IJ SOLN
INTRAMUSCULAR | Status: AC
  Filled 2019-03-09: qty 2

## 2019-03-09 MED ORDER — CHLORHEXIDINE GLUCONATE 0.12% ORAL RINSE (MEDLINE KIT)
15.0000 mL | Freq: Two times a day (BID) | OROMUCOSAL | Status: DC
  Administered 2019-03-09 – 2019-03-11 (×4): 15 mL via OROMUCOSAL

## 2019-03-09 MED ORDER — FENTANYL CITRATE (PF) 100 MCG/2ML IJ SOLN: 50.0000 ug | Freq: Once | INTRAMUSCULAR | Status: AC

## 2019-03-09 MED ORDER — MIDAZOLAM HCL 2 MG/2ML IJ SOLN: 2.0000 mg | Freq: Once | INTRAMUSCULAR | Status: AC

## 2019-03-09 MED ORDER — HEPARIN SODIUM (PORCINE) 5000 UNIT/ML IJ SOLN
5000.0000 [IU] | Freq: Three times a day (TID) | INTRAMUSCULAR | Status: DC
  Administered 2019-03-09 – 2019-03-11 (×5): 5000 [IU] via SUBCUTANEOUS
  Filled 2019-03-09 (×6): qty 1

## 2019-03-09 MED ORDER — PRO-STAT SUGAR FREE PO LIQD
30.0000 mL | Freq: Two times a day (BID) | ORAL | Status: DC
  Administered 2019-03-09 – 2019-03-10 (×4): 30 mL
  Filled 2019-03-09 (×4): qty 30

## 2019-03-09 MED ORDER — FENTANYL 2500MCG IN NS 250ML (10MCG/ML) PREMIX INFUSION
25.0000 ug/h | INTRAVENOUS | Status: DC
  Administered 2019-03-09: 25 ug/h via INTRAVENOUS
  Administered 2019-03-11: 01:00:00 100 ug/h via INTRAVENOUS
  Filled 2019-03-09 (×2): qty 250

## 2019-03-09 MED ORDER — FENTANYL CITRATE (PF) 100 MCG/2ML IJ SOLN
25.0000 ug | INTRAMUSCULAR | Status: DC | PRN
  Filled 2019-03-09: qty 2

## 2019-03-09 MED ORDER — NOREPINEPHRINE 4 MG/250ML-% IV SOLN: 0.0000 ug/min | INTRAVENOUS | Status: DC

## 2019-03-09 MED ORDER — VASOPRESSIN 20 UNIT/ML IV SOLN
0.0300 [IU]/min | INTRAVENOUS | Status: DC
  Administered 2019-03-09: 22:00:00 0.03 [IU]/min via INTRAVENOUS
  Filled 2019-03-09 (×2): qty 2

## 2019-03-09 MED ORDER — SODIUM CHLORIDE 0.9 % BOLUS PEDS: 250.0000 mL | Freq: Once | INTRAVENOUS | Status: DC

## 2019-03-09 MED ORDER — NOREPINEPHRINE 4 MG/250ML-% IV SOLN: 2.0000 ug/min | INTRAVENOUS | Status: DC

## 2019-03-09 MED ORDER — INSULIN ASPART 100 UNIT/ML ~~LOC~~ SOLN: 0.0000 [IU] | Freq: Three times a day (TID) | SUBCUTANEOUS | Status: DC

## 2019-03-09 MED ORDER — FENTANYL CITRATE (PF) 100 MCG/2ML IJ SOLN
25.0000 ug | INTRAMUSCULAR | Status: AC | PRN
  Administered 2019-03-09 (×3): 25 ug via INTRAVENOUS

## 2019-03-09 MED ORDER — LACTATED RINGERS IV SOLN: INTRAVENOUS | Status: DC

## 2019-03-09 MED ORDER — VITAL HIGH PROTEIN PO LIQD
1000.0000 mL | ORAL | Status: DC
  Administered 2019-03-09 – 2019-03-10 (×3): 1000 mL

## 2019-03-09 MED ORDER — DEXMEDETOMIDINE HCL IN NACL 200 MCG/50ML IV SOLN
0.0000 ug/kg/h | INTRAVENOUS | Status: DC
  Filled 2019-03-09: qty 50

## 2019-03-09 MED ORDER — ORAL CARE MOUTH RINSE
15.0000 mL | OROMUCOSAL | Status: DC
  Administered 2019-03-09 – 2019-03-11 (×17): 15 mL via OROMUCOSAL

## 2019-03-09 MED ORDER — SODIUM CHLORIDE 0.9 % IV SOLN
2.0000 g | Freq: Four times a day (QID) | INTRAVENOUS | Status: DC
  Administered 2019-03-09 – 2019-03-10 (×4): 2 g via INTRAVENOUS
  Filled 2019-03-09 (×3): qty 2000
  Filled 2019-03-09 (×2): qty 2
  Filled 2019-03-09: qty 2000

## 2019-03-09 MED ORDER — SODIUM CHLORIDE 0.9% FLUSH
10.0000 mL | Freq: Two times a day (BID) | INTRAVENOUS | Status: DC
  Administered 2019-03-09 – 2019-03-10 (×2): 10 mL
  Administered 2019-03-10 – 2019-03-11 (×2): 40 mL

## 2019-03-09 MED FILL — Medication: Qty: 1 | Status: AC

## 2019-03-09 NOTE — Progress Notes (Signed)
  Hypoglycemic Event  CBG:69   Treatment: 15 oz OJ  Symptoms: Slight drowsiness otherwise asymptomatic   Follow-up CBG: Time:0435 CBG Result: 86  Possible Reasons for Event: Poor PO intake, night time insulin dose  Comments/MD notified:BodenheimerNP    Jorene Minors

## 2019-03-09 NOTE — Progress Notes (Signed)
[place] Progress Note Patient Name: [name] [name] DOB: 03-Jul-1940 MRN: [id number]   Date of Service  03/09/2019  HPI/Events of Note  ABG on 70%/PRVC 16/TV 450/P 5 = 7.27/38.5/57.7.  eICU Interventions  Will order: 1. Increase PRVC rate to 21. 2. Repeat ABG at 5 AM.     Intervention Category Major Interventions: Respiratory failure - evaluation and management;Acid-Base disturbance - evaluation and management  Aydon Swamy Eugene 03/09/2019, 10:33 PM

## 2019-03-09 NOTE — Progress Notes (Addendum)
[place] Progress Note Patient Name: [name] [name] DOB: [birth year] MRN: [id number]   Date of Service  03/09/2019  HPI/Events of Note  Hypotension -  BP = 86/45 with MAP = 45. Now on Norepinephrine IV infusion at 30 mcg/min. CVP = 11 and Hgb = 8.1.   eICU Interventions  Will order: 1. Bolus with 0.9 NaCl 500 mL IV over 30 minutes now.  2. ABG STAT. 3. Add Vasopressin at shock dose.  4. Increase ceiling on Norepinephrine IV infusion to 60 mcg/min.     Intervention Category Major Interventions: Hypotension - evaluation and management  Lysle Dingwall 03/09/2019, 9:29 PM

## 2019-03-09 NOTE — Progress Notes (Signed)
Chaplain present for support and assessment during Code Blue.    Pt family not present.    Chaplain Clausen and [place] covering this afternoon.  Please page [facility] oncall pager at [phone] as needs arise.

## 2019-03-09 NOTE — Progress Notes (Signed)
ABG obtained per order, 2154 lab notified of sample being sent to analysis.

## 2019-03-09 NOTE — Progress Notes (Signed)
Post ABG results, Fio2 increased to .70%.

## 2019-03-09 NOTE — Consult Note (Signed)
NAME:  [name] [name], MRN:  [id number], DOB:  1941-03-04, LOS: 3 ADMISSION DATE:  02/18/2019, CONSULTATION DATE:  03/09/2019 REFERRING MD: Dr. Horris Latino, CHIEF COMPLAINT: Post cardiac arrest  Brief History   Patient with multiple comorbidities, advanced chronic diseases Being treated for UTI Suffered a cardiorespiratory arrest 03/09/2019 Code lasted about 15 minutes of PEA, received 1 round of epinephrine  History of atrial fibrillation, AAA, congestive heart failure, stage III chronic kidney disease, coronary artery disease, CABG, GERD, history of GI bleed, hypertension, hypothyroidism History of retroperitoneal fibrosis, history of bilateral ureteral strictures Left kidney is nonfunctioning, right kidney has had multiple stents-multiple UTIs Recent concern was for 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 A-fib ([place])   . AAA (abdominal aortic aneurysm) ([place])   . Bilateral hydronephrosis   . Carotid artery stenosis    carotid doppler 06/2012 - Right CCA/Bulb/ICA with chronic occlusion; L vertebral artery with abnormal blood flow; L Bulb/Prox ICA  s/p endarterectomy with mild fibrous plaque, 50% diameter reduction  . CHF (congestive heart failure) ([place])   . Choledocholithiasis 2017  . CKD (chronic kidney disease), stage III   . Coronary artery disease due to lipid rich plaque cardiologist-  dr berry   s/p CABG x6 1997-- cath 12-09-2009 occluded vein to obtuse marginal branch and ramus branch with patent vien to PDA and patent LIMA to LAD, ef 40%-- Myoview 11-24-2011, nonischemic  . Diverticulosis   . Dyspnea on exertion   . GERD (gastroesophageal reflux disease)   . GI bleed   . History of sepsis    10-18-2014 w/ acute pyelonephritis  . Hyperlipidemia   . Hypertension   . Hypothyroidism   . Iron deficiency anemia   . Ischemic cardiomyopathy    03-25-2010-- per lasts echo EF  50-55%  . Ischemic cardiomyopathy   .  PAD (peripheral artery disease) ([place])    09/2010 LEAs - R ABI of 0.45, occluded fem-pop bypass graft, L ABI of 0.59 with occluded SFA; severe arterial insuff  . PONV (postoperative nausea and vomiting)   . PVD (peripheral vascular disease) with claudication ([place])    last duplex 07-04-2015 -- Right CCA and ICA chronic occlusion, 48-54% LICA, Patent vertebral arteries w/ antegrade flow, bilateral normal subclavian arteries   . Retroperitoneal fibrosis   . Tubular adenoma of colon   . Type 2 diabetes mellitus ([place])    monitored by dr Dwyane Dee   [facility] Events   Cardiorespiratory arrest 03/09/2019  Consults:  Urology  Procedures:   none Significant Diagnostic Tests:  CT abdomen IMPRESSION: 1. No acute intrathoracic pathology. 2. 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 Follow-up in 1 year recommended. 3. Coronary vascular calcification and postsurgical changes of CABG. 4. Partially visualized chronic appearing left hydronephrosis. 5. Small hiatal hernia. 6. Aortic Atherosclerosis (ICD10-I70.0). Micro Data:  02/11/2019-blood culture STAPHYLOCOCCUS LUGDUNENSIS  Antimicrobials:  Cefepime 12/29 Ceftriaxone 12/29>> Metronidazole 12/29 Vancomycin 12/29  Interim history/subjective:   Did have hypoglycemic event earlier in the morning She was having a meal this afternoon Cardiorespiratory arrest at about noon today  Objective   Blood pressure (!) 113/95, pulse 72, temperature 98.1 F (36.7 C), resp. rate 16, height 5\' 5"  (1.651 m), weight 55 kg, SpO2 100 %.    Vent Mode: PRVC FiO2 (%):  [100 %] 100 % Set Rate:  [16 bmp] 16 bmp Vt Set:  [450 mL]  450 mL PEEP:  [5 [place] Pressure:  [17 cmH20] 17 cmH20   Intake/Output Summary (Last 24 hours) at 03/09/2019 1250 Last data filed at 03/09/2019 0900 Gross per 24 hour  Intake 1261.1 ml  Output 750 ml  Net 511.1 ml   Filed Weights   02/07/2019  1644 03/07/19 1730  Weight: 55 kg 55 kg    Examination: General: Elderly lady, frail, chronically ill-appearing HENT: Dry oral mucosa Lungs: Clear Cardiovascular: S1-S2 Abdomen: Soft, bowel sounds appreciated Extremities: No clubbing, no edema, necrotic areas Neuro: Sedated GU:   Chest x-ray 03/09/2019-no acute infiltrate, pleural thickening left base Abdominal x-ray-OG tube appropriately placed Recent CT scan of the chest without contrast 03/07/2019-pleural thickening, right lung nodule  Resolved Hospital Problem list     Assessment & Plan:  Status post cardiorespiratory arrest -Lasted about 15 minutes, PEA, received 1 round of epi-ROSC with adequate blood pressure -Etiology of a cardiorespiratory arrest is unclear -Possible aspiration -Continue supportive right ventilator support -Hemodynamic support  Sepsis -Currently on cefepime-continue -Trend CBC -Monitor  Bilateral hydronephrosis -Nonfunctional left kidney  Plan for suprapubic catheter Nephrostomy tube placement  Pathological fractures/concern for metastatic disease -Cortical irregularity/bone destruction -May need PET/CT -Plan for outpatient follow-up  Chronic kidney disease stage III -Trend electrolytes -Avoid nephrotoxic's  Anemia of chronic disease -Transfuse per protocol -Monitor  Chronic systolic and diastolic heart failure -Cautious hydration as she was dry initially  Hypothyroidism -On Synthroid  Diabetes -SSI  Multiple pressure ulcers -Sacrum, ankle, heel -Wound consulted  Best practice:  Diet: Per protocol Pain/Anxiety/Delirium protocol (if indicated): Precedex and fentanyl VAP protocol (if indicated): In place DVT prophylaxis: scd GI prophylaxis: Protonix Glucose control: SSI Mobility: Bedrest Code Status: Full code Family Communication: Will update daughter Disposition: ICU  Labs   CBC: Recent Labs  Lab 03/03/2019 1718 03/07/19 0445 03/08/19 0634 03/09/19 0520  WBC  19.4* 14.6* 17.4* 18.4*  NEUTROABS 17.5*  --  15.3* 16.3*  HGB 8.6* 7.5* 7.4* 7.2*  HCT 29.5* 26.0* 25.5* 25.5*  MCV 103.5* 104.0* 103.7* 106.7*  PLT 502* 416* 407* 412*    Basic Metabolic Panel: Recent Labs  Lab 03/07/2019 1718 03/07/19 0445 03/08/19 0634 03/09/19 0520  NA 140 140 141 142  K 4.2 3.5 4.0 3.7  CL 103 108 107 113*  CO2 25 24 23 22   GLUCOSE 116* 97 81 101*  BUN 75* 72* 60* 50*  CREATININE 1.72* 1.48* 1.29* 1.29*  CALCIUM 8.5* 7.6* 7.9* 7.9*   GFR: Estimated Creatinine Clearance: 31.7 mL/min (A) (by C-G formula based on SCr of 1.29 mg/dL (H)). Recent Labs  Lab 02/25/2019 1718 02/08/2019 1745 02/20/2019 1957 03/08/2019 2326 03/07/19 0445 03/08/19 0634 03/09/19 0520  WBC 19.4*  --   --   --  14.6* 17.4* 18.4*  LATICACIDVEN  --  2.2* 2.0* 1.4  --   --   --     Liver Function Tests: No results for input(s): AST, ALT, ALKPHOS, BILITOT, PROT, ALBUMIN in the last 168 hours. No results for input(s): LIPASE, AMYLASE in the last 168 hours. No results for input(s): AMMONIA in the last 168 hours.  ABG    Component Value Date/Time   TCO2 17 08/28/2015 1834     Coagulation Profile: Recent Labs  Lab 02/15/2019 1718  INR 1.1    Cardiac Enzymes: No results for input(s): CKTOTAL, CKMB, CKMBINDEX, TROPONINI in the last 168 hours.  HbA1C: Hgb A1c MFr Bld  Date/Time Value Ref Range Status  02/10/2019 11:26 PM 4.9 4.8 -  5.6 % Final    Comment:    (NOTE) Pre diabetes:          5.7%-6.4% Diabetes:              >6.4% Glycemic control for   <7.0% adults with diabetes   11/22/2018 09:41 AM 5.6 4.8 - 5.6 % Final    Comment:    (NOTE)         Prediabetes: 5.7 - 6.4         Diabetes: >6.4         Glycemic control for adults with diabetes: <7.0     CBG: Recent Labs  Lab 03/09/19 0005 03/09/19 0418 03/09/19 0450 03/09/19 0744 03/09/19 1140  GLUCAP 79 69* 86 84 82    Review of Systems:   Unable to obtain Past Medical History  She,  has a past medical  history of A-fib ([place]), AAA (abdominal aortic aneurysm) ([place]), Bilateral hydronephrosis, Carotid artery stenosis, CHF (congestive heart failure) ([place]), Choledocholithiasis (2017), CKD (chronic kidney disease), stage III, Coronary artery disease due to lipid rich plaque (cardiologist-  dr berry), Diverticulosis, Dyspnea on exertion, GERD (gastroesophageal reflux disease), GI bleed, History of sepsis, Hyperlipidemia, Hypertension, Hypothyroidism, Iron deficiency anemia, Ischemic cardiomyopathy, Ischemic cardiomyopathy, PAD (peripheral artery disease) ([place]), PONV (postoperative nausea and vomiting), PVD (peripheral vascular disease) with claudication ([place]), Retroperitoneal fibrosis, Tubular adenoma of colon, and Type 2 diabetes mellitus ([place]).   Surgical History    Past Surgical History:  Procedure Laterality Date  . AORTA - BILATERAL FEMORAL ARTERY BYPASS GRAFT  1997   and RIGHT FEM-POP   . CARDIAC CATHETERIZATION  12-09-2009  dr al little   EF >40%-- occluded vein to OM & ramus branches, patent vein to PDA, patent LIMA to LAD (Dr. [name], [facility]) - later had thrombectomy of R fem-pop bypass ad R common femoral & profunda femoris artery (Dr. [place] Alar)  . CARDIOVASCULAR STRESS TEST  11-24-2011   dr berry   Low Risk study: fixed basal to mid inferior attenuation artifact, no reversible ischemia,  normal LV function and wall motion , ef 67%  . CAROTID ENDARTERECTOMY Bilateral right 1994//  left ?  Marland Kitchen CATARACT EXTRACTION W/ INTRAOCULAR LENS  IMPLANT, BILATERAL  2006  . CHOLECYSTECTOMY N/A 09/05/2015   Procedure: ATTEMPTED LAPAROSCOPIC CHOLECYSTECTOMY, EXPLORATORY LAPAROTOMY WITH CHOLECYSTECTOMY;  Surgeon: Autumn Messing III, MD;  Location: [place];  Service: General;  Laterality: N/A;  . COLONOSCOPY    . COLONOSCOPY  10/21/2015  . COLONOSCOPY WITH PROPOFOL N/A 10/21/2015   Procedure: COLONOSCOPY WITH PROPOFOL;  Surgeon: Milus Banister, MD;  Location: [place];  Service: Endoscopy;  Laterality: N/A;  . CORONARY  ARTERY BYPASS GRAFT  1997   x6; internal mammary to LAD, SVG to ramus #1 & #2, SVG to OM, SVG to PDA,   . CYSTOSCOPY W/ RETROGRADES Right 08/01/2015   Procedure: CYSTOSCOPY WITH RETROGRADE PYELOGRAM;  Surgeon: Ardis Hughs, MD;  Location[facility];  Service: Urology;  Laterality: Right;  . CYSTOSCOPY W/ URETERAL STENT PLACEMENT  03/10/2012   Procedure: CYSTOSCOPY WITH RETROGRADE PYELOGRAM/URETERAL STENT PLACEMENT;  Surgeon: Hanley Ben, MD;  [facility];  Service: Urology;  Laterality: Left;  . CYSTOSCOPY W/ URETERAL STENT PLACEMENT Bilateral 03/07/2015   Procedure: BILATERAL RETROGRADE PYELOGRAM AND RIGHT URETERAL STENT PLACEMENT;  Surgeon: Ardis Hughs, MD;  Location: [facility];  Service: Urology;  Laterality: Bilateral;  . CYSTOSCOPY W/ URETERAL STENT PLACEMENT Right 08/01/2015   Procedure: CYSTOSCOPY WITH STENT REPLACEMENT;  Surgeon: Ardis Hughs, MD;  Location: [facility];  Service: Urology;  Laterality: Right;  . CYSTOSCOPY W/ URETERAL STENT PLACEMENT Right 02/13/2016   Procedure: RIGHT URETERAL STENT EXCHANGE;  Surgeon: Ardis Hughs, MD;  [facility];  Service: Urology;  Laterality: Right;  . CYSTOSCOPY W/ URETERAL STENT PLACEMENT Right 09/17/2016   Procedure: CYSTOSCOPY WITH RIGHT  RETROGRADE PYELOGRAM RIGHT URETERAL STENT EXCHANGE;  Surgeon: Ardis Hughs, MD;  [facility];  Service: Urology;  Laterality: Right;  . CYSTOSCOPY W/ URETERAL STENT PLACEMENT Bilateral 03/18/2017   Procedure: CYSTOSCOPY WITH BILATERAL  RETROGRADE PYELOGRAM RIGHT URETERAL STENT [place];  Surgeon: Ardis Hughs, MD;  [facility];  Service: Urology;  Laterality: Bilateral;  . CYSTOSCOPY W/ URETERAL STENT PLACEMENT Right 05/26/2018   Procedure: CYSTOSCOPY WITH RIGHT RETROGRADE PYELOGRAM RIGHT URETERAL STENT EXCHANGE;  Surgeon: Ardis Hughs, MD;  [facility];  Service: Urology;  Laterality:  Right;  . CYSTOSCOPY WITH STENT PLACEMENT Right 10/06/2017   Procedure: CYSTOSCOPY, RETROGRADE  WITH RIGHT STENT EXCHANGE;  Surgeon: Ardis Hughs, MD;  [facility];  Service: Urology;  Laterality: Right;  . CYSTOSCOPY WITH STENT PLACEMENT Right 12/01/2018   Procedure: CYSTOSCOPY WITH STENT PLACEMENT;  Surgeon: Ardis Hughs, MD;  [facility];  Service: Urology;  Laterality: Right;  . ERCP N/A 09/03/2015   Procedure: ENDOSCOPIC RETROGRADE CHOLANGIOPANCREATOGRAPHY (ERCP);  Surgeon: Irene Shipper, MD;  [facility];  Service: Endoscopy;  Laterality: N/A;  . ESOPHAGOGASTRODUODENOSCOPY    . ESOPHAGOGASTRODUODENOSCOPY N/A 10/19/2015   Procedure: ESOPHAGOGASTRODUODENOSCOPY (EGD);  Surgeon: Gatha Mayer, MD;  Location[facility];  Service: Endoscopy;  Laterality: N/A;  . GIVENS CAPSULE STUDY  10/21/2015  . GIVENS CAPSULE STUDY N/A 10/21/2015   Procedure: GIVENS CAPSULE STUDY;  Surgeon: Milus Banister, MD;  Location: [place];  Service: Endoscopy;  Laterality: N/A;  . KNEE SURGERY Right 03-01-2018-03-04-2018   PLATEAU FRACTURE HOSPITLIZATION   . REPAIR RIGHT FEMORAL PSEUDOANEUYSM/  RIGHT FEM-POP BYPASS GRAFT/  DEBRIDEMENT RIGHT LOWER EXTREMITIY VENOUS STATUS ULCERS X2  01-12-2005  . TOTAL ABDOMINAL HYSTERECTOMY W/ BILATERAL SALPINGOOPHORECTOMY  1986  . TRANSTHORACIC ECHOCARDIOGRAM  03/25/2010   EF 81-85%, LV systolic function low normal with mild inferoseptal hypocontractility; LA mildly dilated; mod MR; mild TR, RV systolic pressure elevated, mild pulm HTN; AV mildly sclerotic; mild pulm valve regurg; aortic root sclerosis/calcif      Social History   reports that she quit smoking about 9 years ago. She has a 60.00 pack-year smoking history. She has never used smokeless tobacco. She reports current alcohol use of about 3.0 standard drinks of alcohol per week. She reports that she does not use drugs.   Family History   Her family history includes Cancer in her  maternal aunt; Congestive Heart Failure in her mother; Diabetes in her mother; Stroke in her father.   Allergies No Known Allergies   The patient is critically ill with multiple organ systems failure and requires high complexity decision making for assessment and support, frequent evaluation and titration of therapies, application of advanced monitoring technologies and extensive interpretation of multiple databases. Critical Care Time devoted to patient care services described in this note independent of APP/resident time (if applicable)  is 40 minutes.   Sherrilyn Rist MD [facility] Personal pager: [phone] If unanswered, please page CCM On-call: [phone]

## 2019-03-09 NOTE — Procedures (Signed)
Central Venous Catheter Insertion Procedure Note KALANI BARAY [id number] [birth year]  Procedure: Insertion of Central Venous Catheter Indications: Assessment of intravascular volume  Procedure Details Consent: Risks of procedure as well as the alternatives and risks of each were explained to the (patient/caregiver).  Consent for procedure obtained. Time Out: Verified patient identification, verified procedure, site/side was marked, verified correct patient position, special equipment/implants available, medications/allergies/relevent history reviewed, required imaging and test results available.  Performed  Maximum sterile technique was used including antiseptics, cap, gloves, gown, hand hygiene, mask and sheet. Skin prep: Chlorhexidine; local anesthetic administered A antimicrobial bonded/coated triple lumen catheter was placed in the right subclavian vein using the Seldinger technique.  Evaluation Blood flow good Complications: No apparent complications Patient did tolerate procedure well. Chest X-ray ordered to verify placement.  CXR: pending.  Leticia Mcdiarmid A German Manke 03/09/2019, 5:36 PM

## 2019-03-09 NOTE — Progress Notes (Signed)
Post central line chest x-ray reviewed showing no pneumothorax  Line and endotracheal tube and orogastric tube in adequate position

## 2019-03-09 NOTE — Significant Event (Signed)
Rapid Response Event Note  Overview: Time Called: May 30, 1204 Arrival Time: 05-30-1204 Event Type: Respiratory, Cardiac  Initial Focused Assessment:  CODE BLUE called over head. Responded as Zoll pads and backboard were being placed. Compressions started at 1206. At time of arrest PEA on monitor. Patient had potential aspiration event during meal time. Arrest witnessed, patient on telemetry at time of event. CPR initiated. Patient had ROSC post epi and intubation. Intubated by ED provider See resuscitation sheet for further details. PCCM MD consulted and patient transported to ICU for stabilization. Patient began waking up and attempting self extubation. 50 mcg of Fentanyl, 2 of Versed given for comfort at order of ED MD. Patietn hypertensive at this time. Patient placed on ventilator by respiratory. Care transitioned to ICU team, report called by bedside RN.    Event Summary: Name of Physician Notified: Kendal Hymen MD at 1204/05/30  Name of Consulting Physician Notified: Clearence Ped MD at May 30, 1217  Outcome: Coded and survived, Transferred (Comment)  Event End Time: [place]

## 2019-03-09 NOTE — ED Provider Notes (Signed)
[facility]  Department of Emergency Medicine   Code Blue CONSULT NOTE  Chief Complaint: Cardiac arrest/unresponsive   Level V Caveat: Unresponsive  History of present illness: I was contacted by the hospital for a CODE BLUE cardiac arrest upstairs and presented to the patient's bedside at 12 pm 03/09/2019   ROS: Unable to obtain, Level V caveat  Scheduled Meds: . vitamin C  500 mg Oral QHS  . calcium-vitamin D  1 tablet Oral Daily  . Chlorhexidine Gluconate Cloth  6 each Topical Daily  . docusate sodium  100 mg Oral BID  . fentaNYL      . ferrous sulfate  650 mg Oral QPM  . insulin aspart  0-6 Units Subcutaneous TID WC  . levothyroxine  150 mcg Oral Q0600  . midazolam      . polyethylene glycol  17 g Oral Daily  . tamsulosin  0.4 mg Oral Daily   Continuous Infusions: . cefTRIAXone (ROCEPHIN)  IV 2 g (03/09/19 0437)   PRN Meds:.acetaminophen, HYDROcodone-acetaminophen, ondansetron (ZOFRAN) IV Past Medical History:  Diagnosis Date  . A-fib ([place])   . AAA (abdominal aortic aneurysm) ([place])   . Bilateral hydronephrosis   . Carotid artery stenosis    carotid doppler 06/2012 - Right CCA/Bulb/ICA with chronic occlusion; L vertebral artery with abnormal blood flow; L Bulb/Prox ICA  s/p endarterectomy with mild fibrous plaque, 50% diameter reduction  . CHF (congestive heart failure) ([place])   . Choledocholithiasis 2017  . CKD (chronic kidney disease), stage III   . Coronary artery disease due to lipid rich plaque cardiologist-  dr berry   s/p CABG x6 1997-- cath 12-09-2009 occluded vein to obtuse marginal branch and ramus branch with patent vien to PDA and patent LIMA to LAD, ef 40%-- Myoview 11-24-2011, nonischemic  . Diverticulosis   . Dyspnea on exertion   . GERD (gastroesophageal reflux disease)   . GI bleed   . History of sepsis    10-18-2014 w/ acute pyelonephritis  . Hyperlipidemia   . Hypertension   . Hypothyroidism   . Iron deficiency anemia   . Ischemic  cardiomyopathy    03-25-2010-- per lasts echo EF  50-55%  . Ischemic cardiomyopathy   . PAD (peripheral artery disease) ([place])    09/2010 LEAs - R ABI of 0.45, occluded fem-pop bypass graft, L ABI of 0.59 with occluded SFA; severe arterial insuff  . PONV (postoperative nausea and vomiting)   . PVD (peripheral vascular disease) with claudication ([place])    last duplex 07-04-2015 -- Right CCA and ICA chronic occlusion, 23-55% LICA, Patent vertebral arteries w/ antegrade flow, bilateral normal subclavian arteries   . Retroperitoneal fibrosis   . Tubular adenoma of colon   . Type 2 diabetes mellitus ([place])    monitored by dr Dwyane Dee   Past Surgical History:  Procedure Laterality Date  . AORTA - BILATERAL FEMORAL ARTERY BYPASS GRAFT  1997   and RIGHT FEM-POP   . CARDIAC CATHETERIZATION  12-09-2009  dr al little   EF >40%-- occluded vein to OM & ramus branches, patent vein to PDA, patent LIMA to LAD (Dr. [name], [facility]) - later had thrombectomy of R fem-pop bypass ad R common femoral & profunda femoris artery (Dr. Oneida Alar)  . CARDIOVASCULAR STRESS TEST  11-24-2011   dr berry   Low Risk study: fixed basal to mid inferior attenuation artifact, no reversible ischemia,  normal LV function and wall motion , ef 67%  . CAROTID ENDARTERECTOMY Bilateral right 1994//  left ?  . CATARACT EXTRACTION W/ INTRAOCULAR LENS  IMPLANT, BILATERAL  2006  . CHOLECYSTECTOMY N/A 09/05/2015   Procedure: ATTEMPTED LAPAROSCOPIC CHOLECYSTECTOMY, EXPLORATORY LAPAROTOMY WITH CHOLECYSTECTOMY;  Surgeon: Autumn Messing III, MD;  Location: [place];  Service: General;  Laterality: N/A;  . COLONOSCOPY    . COLONOSCOPY  10/21/2015  . COLONOSCOPY WITH PROPOFOL N/A 10/21/2015   Procedure: COLONOSCOPY WITH PROPOFOL;  Surgeon: Milus Banister, MD;  Location: [place];  Service: Endoscopy;  Laterality: N/A;  . CORONARY ARTERY BYPASS GRAFT  1997   x6; internal mammary to LAD, SVG to ramus #1 & #2, SVG to OM, SVG to PDA,   . CYSTOSCOPY W/  RETROGRADES Right 08/01/2015   Procedure: CYSTOSCOPY WITH RETROGRADE PYELOGRAM;  Surgeon: Ardis Hughs, MD;  Location[facility];  Service: Urology;  Laterality: Right;  . CYSTOSCOPY W/ URETERAL STENT PLACEMENT  03/10/2012   Procedure: CYSTOSCOPY WITH RETROGRADE PYELOGRAM/URETERAL STENT PLACEMENT;  Surgeon: Hanley Ben, MD;  Location: [facility];  Service: Urology;  Laterality: Left;  . CYSTOSCOPY W/ URETERAL STENT PLACEMENT Bilateral 03/07/2015   Procedure: BILATERAL RETROGRADE PYELOGRAM AND RIGHT URETERAL STENT PLACEMENT;  Surgeon: Ardis Hughs, MD;  Location: [facility];  Service: Urology;  Laterality: Bilateral;  . CYSTOSCOPY W/ URETERAL STENT PLACEMENT Right 08/01/2015   Procedure: CYSTOSCOPY WITH STENT REPLACEMENT;  Surgeon: Ardis Hughs, MD;  Location: [facility];  Service: Urology;  Laterality: Right;  . CYSTOSCOPY W/ URETERAL STENT PLACEMENT Right 02/13/2016   Procedure: RIGHT URETERAL STENT EXCHANGE;  Surgeon: Ardis Hughs, MD;  Location: [facility];  Service: Urology;  Laterality: Right;  . CYSTOSCOPY W/ URETERAL STENT PLACEMENT Right 09/17/2016   Procedure: CYSTOSCOPY WITH RIGHT  RETROGRADE PYELOGRAM RIGHT URETERAL STENT EXCHANGE;  Surgeon: Ardis Hughs, MD;  Location: [facility];  Service: Urology;  Laterality: Right;  . CYSTOSCOPY W/ URETERAL STENT PLACEMENT Bilateral 03/18/2017   Procedure: CYSTOSCOPY WITH BILATERAL  RETROGRADE PYELOGRAM RIGHT URETERAL STENT [place];  Surgeon: Ardis Hughs, MD;  Location: [facility];  Service: Urology;  Laterality: Bilateral;  . CYSTOSCOPY W/ URETERAL STENT PLACEMENT Right 05/26/2018   Procedure: CYSTOSCOPY WITH RIGHT RETROGRADE PYELOGRAM RIGHT URETERAL STENT EXCHANGE;  Surgeon: Ardis Hughs, MD;  Location: [facility];  Service: Urology;  Laterality: Right;  . CYSTOSCOPY WITH STENT PLACEMENT Right 10/06/2017   Procedure: CYSTOSCOPY, RETROGRADE  WITH RIGHT STENT EXCHANGE;   Surgeon: Ardis Hughs, MD;  Location[facility];  Service: Urology;  Laterality: Right;  . CYSTOSCOPY WITH STENT PLACEMENT Right 12/01/2018   Procedure: CYSTOSCOPY WITH STENT PLACEMENT;  Surgeon: Ardis Hughs, MD;  Location[facility];  Service: Urology;  Laterality: Right;  . ERCP N/A 09/03/2015   Procedure: ENDOSCOPIC RETROGRADE CHOLANGIOPANCREATOGRAPHY (ERCP);  Surgeon: Irene Shipper, MD;  Location: [facility];  Service: Endoscopy;  Laterality: N/A;  . ESOPHAGOGASTRODUODENOSCOPY    . ESOPHAGOGASTRODUODENOSCOPY N/A 10/19/2015   Procedure: ESOPHAGOGASTRODUODENOSCOPY (EGD);  Surgeon: Gatha Mayer, MD;  Location: [facility];  Service: Endoscopy;  Laterality: N/A;  . GIVENS CAPSULE STUDY  10/21/2015  . GIVENS CAPSULE STUDY N/A 10/21/2015   Procedure: GIVENS CAPSULE STUDY;  Surgeon: Milus Banister, MD;  Location: [place];  Service: Endoscopy;  Laterality: N/A;  . KNEE SURGERY Right 03-01-2018-03-04-2018   PLATEAU FRACTURE HOSPITLIZATION   . REPAIR RIGHT FEMORAL PSEUDOANEUYSM/  RIGHT FEM-POP BYPASS GRAFT/  DEBRIDEMENT RIGHT LOWER EXTREMITIY VENOUS STATUS ULCERS X2  01-12-2005  . TOTAL ABDOMINAL HYSTERECTOMY W/ BILATERAL SALPINGOOPHORECTOMY  1986  .  TRANSTHORACIC ECHOCARDIOGRAM  03/25/2010   EF 54-65%, LV systolic function low normal with mild inferoseptal hypocontractility; LA mildly dilated; mod MR; mild TR, RV systolic pressure elevated, mild pulm HTN; AV mildly sclerotic; mild pulm valve regurg; aortic root sclerosis/calcif    Social History   Socioeconomic History  . Marital status: Divorced    Spouse name: Not on file  . Number of children: 1  . Years of education: 27  . Highest education level: Not on file  Occupational History  . Not on file  Tobacco Use  . Smoking status: Former Smoker    Packs/day: 2.00    Years: 30.00    Pack years: 60.00    Quit date: 06/07/2009    Years since quitting: 9.7  . Smokeless tobacco: Never Used  Substance and Sexual Activity  .  Alcohol use: Yes    Alcohol/week: 3.0 standard drinks    Types: 3 Standard drinks or equivalent per week    Comment: ocassional  . Drug use: No  . Sexual activity: Not on file  Other Topics Concern  . Not on file  Social History Narrative   Lives alone   Worked 53 yrs [employer]   1 daughter   Social Determinants of Health   Financial Resource Strain:   . Difficulty of Paying Living Expenses: Not on file  Food Insecurity:   . Worried About Charity fundraiser in the Last Year: Not on file  . Ran Out of Food in the Last Year: Not on file  Transportation Needs:   . Lack of Transportation (Medical): Not on file  . Lack of Transportation (Non-Medical): Not on file  Physical Activity:   . Days of Exercise per Week: Not on file  . Minutes of Exercise per Session: Not on file  Stress:   . Feeling of Stress : Not on file  Social Connections:   . Frequency of Communication with Friends and Family: Not on file  . Frequency of Social Gatherings with Friends and Family: Not on file  . Attends Religious Services: Not on file  . Active Member of Clubs or Organizations: Not on file  . Attends Archivist Meetings: Not on file  . Marital Status: Not on file  Intimate Partner Violence:   . Fear of Current or Ex-Partner: Not on file  . Emotionally Abused: Not on file  . Physically Abused: Not on file  . Sexually Abused: Not on file   No Known Allergies  Last set of Vital Signs (not current) Vitals:   03/08/19 2110 03/09/19 0416  BP: (!) 115/56 (!) 113/95  Pulse: 76 72  Resp: 20 16  Temp: 97.7 F (36.5 C) 98.1 F (36.7 C)  SpO2: 100% 100%      Physical Exam Patient lethargic, getting epi and chest compressions  Gen: unresponsive Cardiovascular: pulseless  Resp: apneic. Breath sounds equal bilaterally with bagging  Abd: nondistended  Neuro: GCS 3, unresponsive to pain  HEENT: No blood in posterior pharynx, gag reflex absent  Neck: No crepitus   Musculoskeletal: No deformity  Skin: warm  Procedures  INTUBATION Performed by: Wandra Arthurs Required items: required blood products, implants, devices, and special equipment available Patient identity confirmed: provided demographic data and hospital-assigned identification number Time out: Immediately prior to procedure a "time out" was called to verify the correct patient, procedure, equipment, support staff and site/side marked as required. Indications: cardiac arrest  Intubation method: direct laryngoscopy  Preoxygenation: BVM Sedatives: none Paralytic:  none Tube Size: 7.5 cuffed Post-procedure assessment: chest rise and ETCO2 monitor Breath sounds: equal and absent over the epigastrium Tube secured by Respiratory Therapy Patient tolerated the procedure well with no immediate complications.  CRITICAL CARE Performed by: Wandra Arthurs Total critical care time: 30 Critical care time was exclusive of separately billable procedures and treating other patients. Critical care was necessary to treat or prevent imminent or life-threatening deterioration. Critical care was time spent personally by me on the following activities: development of treatment plan with patient and/or surrogate as well as nursing, discussions with consultants, evaluation of patient's response to treatment, examination of patient, obtaining history from patient or surrogate, ordering and performing treatments and interventions, ordering and review of laboratory studies, ordering and review of radiographic studies, pulse oximetry and re-evaluation of patient's condition.  Cardiopulmonary Resuscitation (CPR) Procedure Note  Directed/Performed by: Wandra Arthurs I personally directed ancillary staff and/or performed CPR in an effort to regain return of spontaneous circulation and to maintain cardiac, neuro and systemic perfusion.    Medical Decision making  Patient was admitted for UTI and apparently went into cardiac  arrest.  Patient went into PEA arrest. Full code per hospitalist team. I intubated patient at bedside. Has good capnometry reading. Patient went back into sinus rhythm. CXR pending. Will transfer to ICU.   Assessment and Plan  Patient transferred to ICU after intubation. She had copious gastric contents and I was able to suction her. Care per primary care team.    Drenda Freeze, MD 03/09/19 1229

## 2019-03-09 NOTE — Procedures (Signed)
Intubation Procedure Note [name] [name] [id number] 21-Mar-1940  Procedure: Intubation Indications: ETT needed to be changed   Received 2mg  of Versed, 20 of etomidate, 30 mg of rocuronium  Procedure Details Consent: Unable to obtain consent because of emergent medical necessity. Time Out: Verified patient identification, verified procedure, site/side was marked, verified correct patient position, special equipment/implants available, medications/allergies/relevent history reviewed, required imaging and test results available.  Performed  Maximum sterile technique was used including cap, gloves, gown, hand hygiene and mask.   Endotracheal tube 7.5 tube was changed with the assistance of a bougie  Bougie was placed through the endotracheal tube that was in place, a new tube was inserted over the bougie Secured in place 23 at the lip  Good color change on colorimetry  Evaluation Hemodynamic Status: BP stable throughout; O2 sats: stable throughout Patient's Current Condition: stable Complications: No apparent complications Patient did tolerate procedure well. Chest X-ray ordered to verify placement.  CXR: pending.   Zachariah Pavek A Jayd Forrey 03/09/2019

## 2019-03-09 NOTE — Progress Notes (Signed)
Per NT pt was eating food fine and had begun choking some, pt. began asking for water. Soon afterwards pt. starting to become less responsive. RN called to room per NT. RN notices patient is pale and now unresponsive. Carotid and radial pulses unable to be felt and CODE BLUE called. Refer to code sheet for specifics during code. Patients daughter notified of condition and states for staff to continue FULL code.   Pt. Had ROSC. Daughter was updated after Code resolved.

## 2019-03-09 NOTE — Progress Notes (Signed)
PROGRESS NOTE  [name] [name] ZHG:[id number] DOB: [birth year] DOA: 03/02/2019 PCP: [place], Betty G, MD  HPI/Recap of past 24 hours: HPI from Dr Illene Regulus Dasaro is a 79 y.o. female with medical history significant of A. fib, AAA, CHF, CKD stage III, CAD status post CABG, GERD, history of GI bleed, hypertension, hyperlipidemia, hypothyroidism, PVD, type 2 diabetes presenting with complaints of suprapubic and right-sided buttock/pelvic pain.  She was sent to the ED by Dr. Louis Meckel from urology.  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 Her right kidney has been managed with stent exchanges, last one being in September.  Patient has not done well since her last stent exchange and has had numerous UTIs. Pt has declined over the past week.  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  She has also developed right-sided hydronephrosis which was found on ultrasound done at urology office.  Urology sent the patient to the ED to be admitted as she will benefit from having a nephrostomy tube in her R kidney as well as a suprapubic catheter given concern for urethral necrosis. Urology will follow. In the ED, CT renal stone study showing new cortical irregularity and bony destruction of the right pubic rami with pathologic fractures, concerning for underlying metastatic disease.  Slightly worsened moderate right hydronephrosis with unchanged right double-J ureteral stent. Patient received vancomycin, cefepime, and metronidazole in the ED, admitted for further management.     Today, patient just complained about pain, denies any new complaints.  Around 1206, CODE BLUE was called due to patient being pulseless/PEA on the monitor.  Patient was being fed by the nursing tech, requested water, suddenly became unresponsive, possible  aspiration. CODE BLUE was initiated, patient was resuscitated about 15 minutes with 1 round of epi and intubation with patient having ROSC.  PCCM consulted and patient was taken to the ICU for stabilization.      Assessment/Plan: Principal Problem:   UTI (urinary tract infection) due to urinary indwelling Foley catheter (HCC) Active Problems:   Sepsis ([place])   Acute kidney injury superimposed on chronic kidney disease (HCC)   Bilateral hydronephrosis   Multiple pathological fractures   Pressure injury of skin  PEA arrest Unknown etiology Likely 2/2 possible ??Aspiration (happened suddenly while patient was eating) Vs sepsis ROSC was obtained after 1 round of epi and intubation (ongoing for about more than 50 minutes) EDP intubated patient Patient transferred to the ICU for further management  Sepsis likely 2/2 ??CAUTI Afebrile, with leukocytosis Lactic acidosis, resolved UA with large leukocytes, negative nitrites, many bacteria, greater than 50 WBC UC showed multiple species, suggests recollection, repeat UC pending BC x2 with 1/4 bottles growing Staphylococcus lugdunensis, await final culture May need ID consult Chest x-ray unremarkable Continue ceftriaxone, started nafcillin Daily CBC Monitor closely  Bilateral hydronephrosis 2/2 ureteral strictures Detailed history as above Left kidney nonfunctional CT showed new right hydroureteronephrosis despite stents being in the appropriate position Urology on board, recommend nephrostomy tube and right kidney, also placing a suprapubic catheter given concern urethral necrosis, procedures currently on hold due to patient being on Plavix Continue gentle hydration Monitor renal function  Pathologic fractures/concern for metastatic disease Complaining of right-sided pelvic/buttock pain CT showing new cortical irregularity and bony destruction of the right pubic rami with pathologic fractures concerning for underlying metastatic  disease CT chest showed no  acute intrathoracic pathology Urology suspecting possible gynecologic malignancy given history of floating mass in her uterus status post hysterectomy 35 years ago  May need PET scan which will be done as an outpatient  CKD stage III Creatinine around baseline Daily BMP, monitor closely in light of hydronephrosis  Macrocytic anemia/anemia of chronic kidney disease Baseline hemoglobin 9-11 Vitamin B12 >7500, folate 5.8 Type and screen done Daily CBC  Chronic systolic and diastolic HF Appears dry Last echo done in 2018 showed EF of 40 to 45%, diffuse hypokinesis, grade 1 diastolic dysfunction Monitor closely while on gentle hydration  Hypothyroidism Continue Synthroid  Diabetes mellitus type 2 Last A1c 4.9 SSI, Accu-Cheks, hypoglycemic protocol  Pressure ulcers Noted in the sacrum and ankle/heel Wound care consulted, appreciate recs  Goals of care discussion Palliative care consulted, poor prognosis/deconditioned/cachectic PT/OT Nutrition consult           Malnutrition Type:      Malnutrition Characteristics:      Nutrition Interventions:       Estimated body mass index is 17.72 kg/m as calculated from the following:   Height as of this encounter: 5\' 5"  (1.651 m).   Weight as of this encounter: 48.3 kg.     Code Status: Full  Family Communication: Discussed with daughter on 03/09/2019 about overall poor prognosis, daughter still wants everything done for now and will discuss with patient's 2 sisters for further goals of care discussion  Disposition Plan: To be determined   Consultants:  Urology Dr. Louis Meckel  PCCM  Procedures:  None  Antimicrobials:  Ceftriaxone  Nafcillin  DVT prophylaxis: Heparin   Objective: Vitals:   03/09/19 1450 03/09/19 1500 03/09/19 1550 03/09/19 1600  BP: (!) 97/34 (!) 93/43    Pulse: 70 67  65  Resp: 17 19  17   Temp:      TempSrc:      SpO2: 100% 100% 100%   Weight:       Height:        Intake/Output Summary (Last 24 hours) at 03/09/2019 1628 Last data filed at 03/09/2019 0900 Gross per 24 hour  Intake 1261.1 ml  Output 550 ml  Net 711.1 ml   Filed Weights   03/03/2019 1644 03/07/19 1730 03/09/19 1240  Weight: 55 kg 55 kg 48.3 kg    Exam:  General: NAD, frail, cachectic, chronically ill-appearing  Cardiovascular: S1, S2 present  Respiratory: CTAB  Abdomen: Soft, suprapubic tenderness, nondistended, bowel sounds present  Musculoskeletal: No bilateral pedal edema noted, bilateral noted pressure ulcers around heel, sacrum  Skin:  Pressure ulcers as above  Psychiatry:  Fair mood   Data Reviewed: CBC: Recent Labs  Lab 02/20/2019 1718 03/07/19 0445 03/08/19 0634 03/09/19 0520 03/09/19 1220  WBC 19.4* 14.6* 17.4* 18.4* 26.8*  NEUTROABS 17.5*  --  15.3* 16.3* 20.0*  HGB 8.6* 7.5* 7.4* 7.2* 8.1*  HCT 29.5* 26.0* 25.5* 25.5* 29.1*  MCV 103.5* 104.0* 103.7* 106.7* 110.6*  PLT 502* 416* 407* 412* 161*   Basic Metabolic Panel: Recent Labs  Lab 02/20/2019 1718 03/07/19 0445 03/08/19 0634 03/09/19 0520 03/09/19 1220 03/09/19 1448  NA 140 140 141 142 142  --   K 4.2 3.5 4.0 3.7 3.8  --   CL 103 108 107 113* 111  --   CO2 25 24 23 22  20*  --   GLUCOSE 116* 97 81 101* 132*  --   BUN 75* 72* 60* 50* 47*  --   CREATININE 1.72* 1.48* 1.29* 1.29* 1.35*  --  CALCIUM 8.5* 7.6* 7.9* 7.9* 8.0*  --   MG  --   --   --   --  2.2 1.8  PHOS  --   --   --   --  3.8 3.5   GFR: Estimated Creatinine Clearance: 26.6 mL/min (A) (by C-G formula based on SCr of 1.35 mg/dL (H)). Liver Function Tests: Recent Labs  Lab 03/09/19 1220  AST 104*  ALT 37  ALKPHOS 153*  BILITOT 0.3  PROT 5.0*  ALBUMIN 1.4*   No results for input(s): LIPASE, AMYLASE in the last 168 hours. No results for input(s): AMMONIA in the last 168 hours. Coagulation Profile: Recent Labs  Lab 03/05/2019 1718 03/09/19 1220  INR 1.1 1.4*   Cardiac Enzymes: No results for input(s):  CKTOTAL, CKMB, CKMBINDEX, TROPONINI in the last 168 hours. BNP (last 3 results) No results for input(s): PROBNP in the last 8760 hours. HbA1C: Recent Labs    03/02/2019 2326  HGBA1C 4.9   CBG: Recent Labs  Lab 03/09/19 0005 03/09/19 0418 03/09/19 0450 03/09/19 0744 03/09/19 1140  GLUCAP 79 69* 86 84 82   Lipid Profile: No results for input(s): CHOL, HDL, LDLCALC, TRIG, CHOLHDL, LDLDIRECT in the last 72 hours. Thyroid Function Tests: No results for input(s): TSH, T4TOTAL, FREET4, T3FREE, THYROIDAB in the last 72 hours. Anemia Panel: Recent Labs    03/07/19 0445  VITAMINB12 >7,500*  FOLATE 5.8*  FERRITIN 1,342*  IRON 45  RETICCTPCT 2.0   Urine analysis:    Component Value Date/Time   COLORURINE AMBER (A) 02/25/2019 1957   APPEARANCEUR TURBID (A) 02/28/2019 1957   LABSPEC 1.014 02/22/2019 1957   PHURINE 5.0 02/10/2019 1957   GLUCOSEU NEGATIVE 03/05/2019 1957   GLUCOSEU NEGATIVE 09/04/2018 0833   HGBUR LARGE (A) 03/03/2019 1957   BILIRUBINUR NEGATIVE 03/07/2019 1957   KETONESUR NEGATIVE 02/10/2019 1957   PROTEINUR 100 (A) 03/08/2019 1957   UROBILINOGEN 0.2 09/04/2018 0833   NITRITE NEGATIVE 03/02/2019 1957   LEUKOCYTESUR LARGE (A) 02/22/2019 1957   Sepsis Labs: @LABRCNTIP (procalcitonin:4,lacticidven:4)  ) Recent Results (from the past 240 hour(s))  Blood Culture (routine x 2)     Status: None (Preliminary result)   Collection Time: 02/06/2019  7:00 PM   Specimen: BLOOD  Result Value Ref Range Status   Specimen Description   Final    BLOOD LEFT ANTECUBITAL Performed at [facility], [place] [address] [place], [place] [address]    Special Requests   Final    BOTTLES DRAWN AEROBIC AND ANAEROBIC Blood Culture adequate volume Performed at [facility] [address] [place], [place] [address]    Culture   Final    NO GROWTH 2 DAYS Performed at [place] [address] [place], [place] [address]    Report Status  PENDING  Incomplete  Urine culture     Status: Abnormal   Collection Time: 02/10/2019  7:57 PM   Specimen: Urine, Clean Catch  Result Value Ref Range Status   Specimen Description   Final    URINE, CLEAN CATCH Performed at [facility], [place] [address] [place], [place] [address]    Special Requests   Final    NONE Performed at [facility], [place] [address], [place], [place] [address]    Culture MULTIPLE SPECIES PRESENT, SUGGEST RECOLLECTION (A)  Final   Report Status 03/07/2019 FINAL  Final  Blood Culture (routine x 2)     Status: Abnormal (Preliminary result)  Collection Time: 02/10/2019  7:57 PM   Specimen: BLOOD  Result Value Ref Range Status   Specimen Description   Final    BLOOD RIGHT ANTECUBITAL Performed at [place] [address] [place], [place] [address]    Special Requests   Final    BOTTLES DRAWN AEROBIC AND ANAEROBIC Blood Culture adequate volume Performed at [place] [address], [place], [place] [address]    Culture  Setup Time   Final    AEROBIC BOTTLE ONLY GRAM POSITIVE COCCI CRITICAL RESULT CALLED TO, READ BACK BY AND VERIFIED WITH:  Lavell Luster [facility] 03/08/19 0105 JDW    Culture (A)  Final    STAPHYLOCOCCUS LUGDUNENSIS SUSCEPTIBILITIES TO FOLLOW Performed at [facility], [place] [address] [place], [place] [address]    Report Status PENDING  Incomplete  SARS CORONAVIRUS 2 (TAT 6-24 HRS) Nasopharyngeal Nasopharyngeal Swab     Status: None   Collection Time: 03/07/19 10:27 AM   Specimen: Nasopharyngeal Swab  Result Value Ref Range Status   SARS Coronavirus 2 NEGATIVE NEGATIVE Final    Comment: (NOTE) SARS-CoV-2 target nucleic acids are NOT DETECTED. The SARS-CoV-2 RNA is generally detectable in upper and lower respiratory specimens during the acute phase of infection. Negative results do not preclude SARS-CoV-2 infection, do not rule out co-infections with other  pathogens, and should not be used as the sole basis for treatment or other patient management decisions. Negative results must be combined with clinical observations, patient history, and epidemiological information. The expected result is Negative. Fact Sheet for Patients: SugarRoll.be Fact Sheet for Healthcare Providers: https://www.woods-mathews.com/ This test is not yet approved or cleared by the [place] FDA and  has been authorized for detection and/or diagnosis of SARS-CoV-2 by FDA under an Emergency Use Authorization (EUA). This EUA will remain  in effect (meaning this test can be used) for the duration of the COVID-19 declaration under Section 56 4(b)(1) of the Act, 21 U.S.C. section 360bbb-3(b)(1), unless the authorization is terminated or revoked sooner. Performed at [facility], [place] [address], [place], [place] [address]   MRSA PCR Screening     Status: None   Collection Time: 03/07/19  3:52 PM   Specimen: Nasal Mucosa; Nasopharyngeal  Result Value Ref Range Status   MRSA by PCR NEGATIVE NEGATIVE Final    Comment:        The GeneXpert MRSA Assay (FDA approved for NASAL specimens only), is one component of a comprehensive MRSA colonization surveillance program. It is not intended to diagnose MRSA infection nor to guide or monitor treatment for MRSA infections. Performed at [facility], [place] [address] [place], [place] [address]       Studies: DG Abd 1 View  Result Date: 03/09/2019 CLINICAL DATA:  Orogastric tube placement. EXAM: ABDOMEN - 1 VIEW COMPARISON:  March 06, 2019. FINDINGS: Distal tip of enteric tube is seen in expected position of distal stomach. Right ureteral stent is again noted. No significant bowel dilatation is noted. IMPRESSION: Distal tip of enteric tube is seen in expected position of distal stomach. Electronically Signed   By: Marijo Conception M.D.   On: 03/09/2019 13:05     DG Chest Port 1 View  Result Date: 03/09/2019 CLINICAL DATA:  Status post cardiac arrest. Status post intubation. EXAM: PORTABLE CHEST 1 VIEW COMPARISON:  02/14/2019 FINDINGS: ETT tip terminates above the level of the carina. Heart size is normal. Aortic atherosclerosis. Chronic calcification within  the left base. No pleural effusion or edema. No airspace opacities. IMPRESSION: 1. No acute cardiopulmonary abnormalities. 2. ETT tip terminates above the level of the carina. Electronically Signed   By: Kerby Moors M.D.   On: 03/09/2019 13:06    Scheduled Meds: . vitamin C  500 mg Oral QHS  . calcium-vitamin D  1 tablet Oral Daily  . Chlorhexidine Gluconate Cloth  6 each Topical Daily  . docusate sodium  100 mg Oral BID  . feeding supplement (PRO-STAT SUGAR FREE 64)  30 mL Per Tube BID  . feeding supplement (VITAL HIGH PROTEIN)  1,000 mL Per Tube Q24H  . ferrous sulfate  650 mg Oral QPM  . insulin aspart  0-9 Units Subcutaneous Q4H  . levothyroxine  150 mcg Oral Q0600  . polyethylene glycol  17 g Oral Daily  . tamsulosin  0.4 mg Oral Daily    Continuous Infusions: . cefTRIAXone (ROCEPHIN)  IV 2 g (03/09/19 0437)  . dexmedetomidine (PRECEDEX) IV infusion    . fentaNYL infusion INTRAVENOUS 25 mcg/hr (03/09/19 1355)  . lactated ringers 50 mL/hr at 03/09/19 1437  . nafcillin IV 2 g (03/09/19 1552)     LOS: 3 days     Alma Friendly, MD [facility]  If 7PM-7AM, please contact night-coverage [email or web address] 03/09/2019, 4:28 PM

## 2019-03-09 NOTE — Progress Notes (Signed)
  Attempt at left IJ was unsuccessful Markedly collapsible vessel  Attempt was abandoned  Patient noted to have a cuff leak and endotracheal tube was subsequently changed over bougie

## 2019-03-09 DEATH — deceased

## 2019-03-10 ENCOUNTER — Inpatient Hospital Stay (HOSPITAL_COMMUNITY): Payer: Medicare Other

## 2019-03-10 DIAGNOSIS — Z7189 Other specified counseling: Secondary | ICD-10-CM

## 2019-03-10 DIAGNOSIS — I38 Endocarditis, valve unspecified: Secondary | ICD-10-CM

## 2019-03-10 DIAGNOSIS — Z515 Encounter for palliative care: Secondary | ICD-10-CM

## 2019-03-10 DIAGNOSIS — T83511D Infection and inflammatory reaction due to indwelling urethral catheter, subsequent encounter: Secondary | ICD-10-CM

## 2019-03-10 DIAGNOSIS — J9690 Respiratory failure, unspecified, unspecified whether with hypoxia or hypercapnia: Secondary | ICD-10-CM

## 2019-03-10 DIAGNOSIS — M8440XA Pathological fracture, unspecified site, initial encounter for fracture: Secondary | ICD-10-CM

## 2019-03-10 LAB — CBC WITH DIFFERENTIAL/PLATELET
Abs Immature Granulocytes: 0.43 10*3/uL — ABNORMAL HIGH (ref 0.00–0.07)
Basophils Absolute: 0.1 10*3/uL (ref 0.0–0.1)
Basophils Relative: 0 %
Eosinophils Absolute: 0 10*3/uL (ref 0.0–0.5)
Eosinophils Relative: 0 %
HCT: 27.2 % — ABNORMAL LOW (ref 36.0–46.0)
Hemoglobin: 7.6 g/dL — ABNORMAL LOW (ref 12.0–15.0)
Immature Granulocytes: 2 %
Lymphocytes Relative: 4 %
Lymphs Abs: 1.2 10*3/uL (ref 0.7–4.0)
MCH: 30.2 pg (ref 26.0–34.0)
MCHC: 27.9 g/dL — ABNORMAL LOW (ref 30.0–36.0)
MCV: 107.9 fL — ABNORMAL HIGH (ref 80.0–100.0)
Monocytes Absolute: 1.2 10*3/uL — ABNORMAL HIGH (ref 0.1–1.0)
Monocytes Relative: 4 %
Neutro Abs: 25.4 10*3/uL — ABNORMAL HIGH (ref 1.7–7.7)
Neutrophils Relative %: 90 %
Platelets: 529 10*3/uL — ABNORMAL HIGH (ref 150–400)
RBC: 2.52 MIL/uL — ABNORMAL LOW (ref 3.87–5.11)
RDW: 17.8 % — ABNORMAL HIGH (ref 11.5–15.5)
WBC: 28.3 10*3/uL — ABNORMAL HIGH (ref 4.0–10.5)
nRBC: 0 % (ref 0.0–0.2)

## 2019-03-10 LAB — ECHOCARDIOGRAM COMPLETE
Height: 65 in
Weight: 1703.71 oz

## 2019-03-10 LAB — BLOOD GAS, ARTERIAL
Acid-base deficit: 8.8 mmol/L — ABNORMAL HIGH (ref 0.0–2.0)
Bicarbonate: 16.4 mmol/L — ABNORMAL LOW (ref 20.0–28.0)
FIO2: 0.709
MECHVT: 460 mL
O2 Saturation: 98.8 %
PEEP: 5 cmH2O
Patient temperature: 98
RATE: 21 resp/min
pCO2 arterial: 33.9 mmHg (ref 32.0–48.0)
pH, Arterial: 7.303 — ABNORMAL LOW (ref 7.350–7.450)
pO2, Arterial: 153 mmHg — ABNORMAL HIGH (ref 83.0–108.0)

## 2019-03-10 LAB — BASIC METABOLIC PANEL
Anion gap: 10 (ref 5–15)
BUN: 54 mg/dL — ABNORMAL HIGH (ref 8–23)
CO2: 17 mmol/L — ABNORMAL LOW (ref 22–32)
Calcium: 7.2 mg/dL — ABNORMAL LOW (ref 8.9–10.3)
Chloride: 113 mmol/L — ABNORMAL HIGH (ref 98–111)
Creatinine, Ser: 1.42 mg/dL — ABNORMAL HIGH (ref 0.44–1.00)
GFR calc Af Amer: 41 mL/min — ABNORMAL LOW (ref >60)
GFR calc non Af Amer: 36 mL/min — ABNORMAL LOW (ref >60)
Glucose, Bld: 184 mg/dL — ABNORMAL HIGH (ref 70–99)
Potassium: 3.4 mmol/L — ABNORMAL LOW (ref 3.5–5.1)
Sodium: 140 mmol/L (ref 135–145)

## 2019-03-10 LAB — MAGNESIUM
Magnesium: 2.5 mg/dL — ABNORMAL HIGH (ref 1.7–2.4)
Magnesium: 2.3 mg/dL (ref 1.7–2.4)

## 2019-03-10 LAB — GLUCOSE, CAPILLARY
Glucose-Capillary: 136 mg/dL — ABNORMAL HIGH (ref 70–99)
Glucose-Capillary: 169 mg/dL — ABNORMAL HIGH (ref 70–99)
Glucose-Capillary: 175 mg/dL — ABNORMAL HIGH (ref 70–99)
Glucose-Capillary: 179 mg/dL — ABNORMAL HIGH (ref 70–99)
Glucose-Capillary: 181 mg/dL — ABNORMAL HIGH (ref 70–99)
Glucose-Capillary: 191 mg/dL — ABNORMAL HIGH (ref 70–99)

## 2019-03-10 LAB — PHOSPHORUS
Phosphorus: 2.9 mg/dL (ref 2.5–4.6)
Phosphorus: 2 mg/dL — ABNORMAL LOW (ref 2.5–4.6)

## 2019-03-10 LAB — CULTURE, BLOOD (ROUTINE X 2): Special Requests: ADEQUATE

## 2019-03-10 LAB — TROPONIN I (HIGH SENSITIVITY)
Troponin I (High Sensitivity): 175 ng/L (ref <18)
Troponin I (High Sensitivity): 286 ng/L (ref <18)

## 2019-03-10 MED ORDER — FERROUS SULFATE 300 (60 FE) MG/5ML PO SYRP
300.0000 mg | ORAL_SOLUTION | Freq: Every day | ORAL | Status: DC
  Administered 2019-03-10: 19:00:00 300 mg
  Filled 2019-03-10: qty 5

## 2019-03-10 MED ORDER — CIPROFLOXACIN IN D5W 400 MG/200ML IV SOLN
400.0000 mg | INTRAVENOUS | Status: DC
  Administered 2019-03-10: 20:00:00 400 mg via INTRAVENOUS
  Filled 2019-03-10: qty 200

## 2019-03-10 MED ORDER — NOREPINEPHRINE 16 MG/250ML-% IV SOLN
0.0000 ug/min | INTRAVENOUS | Status: DC
  Administered 2019-03-10: 10:00:00 12 ug/min via INTRAVENOUS
  Filled 2019-03-10 (×2): qty 250

## 2019-03-10 MED ORDER — VANCOMYCIN HCL IN DEXTROSE 1-5 GM/200ML-% IV SOLN
1000.0000 mg | Freq: Once | INTRAVENOUS | Status: AC
  Administered 2019-03-10: 11:00:00 1000 mg via INTRAVENOUS
  Filled 2019-03-10: qty 200

## 2019-03-10 MED ORDER — VANCOMYCIN HCL 750 MG/150ML IV SOLN: 750.0000 mg | INTRAVENOUS | Status: DC

## 2019-03-10 NOTE — Progress Notes (Signed)
ABG obtained, lab notified.  Sample walked down to lab for analysis at 0432.  Tube station not functioning.

## 2019-03-10 NOTE — Progress Notes (Signed)
CRITICAL VALUE ALERT  Critical Value:  Troponin 286  Date & Time Notied:  03/10/2019, 3299  Provider Notified: Ander Slade, MD  Orders Received/Actions taken: Awaiting orders

## 2019-03-10 NOTE — Progress Notes (Signed)
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 Multiple GU exam w/o overt etiology. ER CT this admission with large destructive lesion at pubic symphysis of unkown primary and several pathologic farctures.  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 Most recent CX from our office Klebsiella sens Cipro. Lenna Gilford this admission pending / no growth / contaminant. Minimal perinephric stranding on imaging this admission, Lactate normal on arrival. WBC elevated 20s.   4 - End of Life Care - pt with rapid functional decline pre-hospitalization and now during hospitalizaiton, now intubated on pressors, Trop I elevation. Palliative sonculst 1/2 and daughter has made DNR, family coming into town 1/3 to discuss comfort only transition.   Today "[name] [name]" is seen for evalution and help in decision making about aggressiveness of care and role of neph tubes in acute setting.   Objective: Vital signs in last 24 hours: Temp:  [97.3 F (36.3 C)-99.6 F (37.6 C)] 99.6 F (37.6 C) (01/02 1604) Pulse Rate:  [58-85] 84 (01/02 1830) Resp:  [15-31] 22 (01/02 1830) BP: (74-150)/(20-89) 118/46 (01/02 1830) SpO2:  [90 %-100 %] 100 % (01/02 1830) FiO2 (%):  [30 %-70 %] 50 % (01/02 1500) Weight:  [48.3 kg] 48.3 kg (01/02 0450) Last BM Date: 03/08/19  Intake/Output from previous day: 01/01 0701 - 01/02 0700 In: 2923.1 [P.O.:180; I.V.:1296.8; NG/GT:440; IV Piggyback:1006.3] Out: 575 [Urine:575] Intake/Output this shift: Total I/O In: 1418 [I.V.:779.4; NG/GT:440; IV Piggyback:198.6] Out: -   General appearance: Very ill appearing, pale, on vent.  Eyes: negative Nose: Nares normal. Septum midline. Mucosa normal. No drainage or sinus  tenderness. Throat: ETT in place. Back: no grimace or CVAT wtih deep palpation.  Resp: minimally coarse on vent.  Cardio: HR mid 80s by monitor.  GI: obese, large midline scar wtih upper portion liekly hernia w/o erythema, is reducible.  Pelvic: external genitalia normal and foley in place with some very light pink urine that is non-foul.  Extremities: atrophic LE, UE edematous.  Pulses: 2+ and symmetric Lymph nodes: Cervical, supraclavicular, and axillary nodes normal. Neurologic: Mental status: GCS 3T  Lab Results:  Recent Labs    03/09/19 1220 03/10/19 0500  WBC 26.8* 28.3*  HGB 8.1* 7.6*  HCT 29.1* 27.2*  PLT 504* 529*   BMET Recent Labs    03/09/19 1220 03/10/19 0500  NA 142 140  K 3.8 3.4*  CL 111 113*  CO2 20* 17*  GLUCOSE 132* 184*  BUN 47* 54*  CREATININE 1.35* 1.42*  CALCIUM 8.0* 7.2*   PT/INR Recent Labs    03/09/19 1220  LABPROT 17.0*  INR 1.4*   ABG Recent Labs    03/09/19 2150 03/10/19 0425  PHART 7.270* 7.303*  HCO3 17.2* 16.4*    Studies/Results: DG Chest 1 View  Result Date: 03/09/2019 CLINICAL DATA:  Tube placement EXAM: CHEST  1 VIEW COMPARISON:  March 09, 2019 FINDINGS: The heart size is stable from prior study. The patient is status post prior median sternotomy. The endotracheal tube terminates approximately 2.8 cm above the carina. There is a right subclavian catheter with tip terminating near the cavoatrial junction. There is no right-sided pneumothorax. Dense calcifications are again noted at the left lung base. There is an enteric tube  that extends below the left hemidiaphragm IMPRESSION: Lines and tubes as above. No pneumothorax. Otherwise, no significant interval change. Electronically Signed   By: Constance Holster M.D.   On: 03/09/2019 17:57   DG Abd 1 View  Result Date: 03/09/2019 CLINICAL DATA:  Orogastric tube placement. EXAM: ABDOMEN - 1 VIEW COMPARISON:  March 06, 2019. FINDINGS: Distal tip of enteric tube is seen in  expected position of distal stomach. Right ureteral stent is again noted. No significant bowel dilatation is noted. IMPRESSION: Distal tip of enteric tube is seen in expected position of distal stomach. Electronically Signed   By: Marijo Conception M.D.   On: 03/09/2019 13:05   DG CHEST PORT 1 VIEW  Result Date: 03/10/2019 CLINICAL DATA:  Respiratory failure. EXAM: PORTABLE CHEST 1 VIEW COMPARISON:  Radiograph yesterday. Chest CT 03/07/2019 FINDINGS: Endotracheal tube tip just at the thoracic inlet. Enteric tube tip below the diaphragm not included in the field of view. Right subclavian central line tip in the SVC. Post median sternotomy and CABG. Left basilar scarring with pleural calcification. Minimal streaky opacities in the right lung base favor atelectasis. No pulmonary edema or pneumothorax. Stable osseous structures. IMPRESSION: 1. Minimal streaky opacities at the right lung base, favoring atelectasis. 2. Otherwise no change from prior exam.  Stable support apparatus. Electronically Signed   By: Keith Rake M.D.   On: 03/10/2019 04:33   DG Chest Port 1 View  Result Date: 03/09/2019 CLINICAL DATA:  Status post cardiac arrest. Status post intubation. EXAM: PORTABLE CHEST 1 VIEW COMPARISON:  02/11/2019 FINDINGS: ETT tip terminates above the level of the carina. Heart size is normal. Aortic atherosclerosis. Chronic calcification within the left base. No pleural effusion or edema. No airspace opacities. IMPRESSION: 1. No acute cardiopulmonary abnormalities. 2. ETT tip terminates above the level of the carina. Electronically Signed   By: Kerby Moors M.D.   On: 03/09/2019 13:06   ECHOCARDIOGRAM COMPLETE  Result Date: 03/10/2019   ECHOCARDIOGRAM REPORT   Patient Name:   [name] [name] Date of Exam: 03/10/2019 Medical Rec #:  [id number]        Height:       65.0 in Accession #:    [id number]       Weight:       106.5 lb Date of Birth:  1940-07-17        BSA:          1.51 m Patient Age:    79 years          BP:           111/51 mmHg Patient Gender: F                HR:           58 bpm. Exam Location:  Inpatient Procedure: 2D Echo, Cardiac Doppler and Color Doppler Indications:    Endocarditis I38  History:        Patient has prior history of Echocardiogram examinations, most                 recent 04/15/2016. CAD, Prior CABG, Arrythmias:Atrial Flutter;                 Risk Factors:Hypertension, Diabetes and Former Smoker. Sepsis.                 AAA. PAD.  Sonographer:    Paulita Fujita RDCS Referring Phys: [id number] [place] Comments: Echo performed with patient supine  and on artificial respirator. IMPRESSIONS  1. Left ventricular ejection fraction, by visual estimation, is 15-20%. The left ventricle has severely decreased function. Left ventricular septal wall thickness was normal. Normal left ventricular posterior wall thickness. There is no left ventricular  hypertrophy.  2. Elevated left ventricular end-diastolic pressure.  3. Left ventricular diastolic parameters are consistent with Grade I diastolic dysfunction (impaired relaxation).  4. Small left ventricular internal cavity size.  5. The left ventricle demonstrates global hypokinesis.  6. Diffuse hypokinesis worse in the basal to mid septal and inferior myocardium.  7. Global right ventricle has normal systolic function.The right ventricular size is normal. No increase in right ventricular wall thickness.  8. Left atrial size was normal.  9. Right atrial size was normal. 10. Mild mitral annular calcification. 11. The mitral valve is normal in structure. Trivial mitral valve regurgitation. No evidence of mitral stenosis. 12. The tricuspid valve is normal in structure. 13. The aortic valve is tricuspid. Aortic valve regurgitation is not visualized. No evidence of aortic valve sclerosis or stenosis. 14. The pulmonic valve was normal in structure. Pulmonic valve regurgitation is not visualized. 15. Normal pulmonary artery systolic pressure.  16. The inferior vena cava is normal in size with greater than 50% respiratory variability, suggesting right atrial pressure of 3 mmHg. FINDINGS  Left Ventricle: Left ventricular ejection fraction, by visual estimation, is 15-20%. The left ventricle has severely decreased function. The left ventricle demonstrates global hypokinesis. The left ventricular internal cavity size was the LV cavity size  is small. Normal left ventricular posterior wall thickness. There is no left ventricular hypertrophy. Left ventricular diastolic parameters are consistent with Grade I diastolic dysfunction (impaired relaxation). Elevated left ventricular end-diastolic pressure. Diffuse hypokinesis worse in the basal to mid septal and inferior myocardium. Right Ventricle: The right ventricular size is normal. No increase in right ventricular wall thickness. Global RV systolic function is has normal systolic function. The tricuspid regurgitant velocity is 2.10 m/s, and with an assumed right atrial pressure  of 3 mmHg, the estimated right ventricular systolic pressure is normal at 20.6 mmHg. Left Atrium: Left atrial size was normal in size. Right Atrium: Right atrial size was normal in size Pericardium: There is no evidence of pericardial effusion. Mitral Valve: The mitral valve is normal in structure. Mild mitral annular calcification. Trivial mitral valve regurgitation. No evidence of mitral valve stenosis by observation. Tricuspid Valve: The tricuspid valve is normal in structure. Tricuspid valve regurgitation is trivial. Aortic Valve: The aortic valve is tricuspid. . There is mild thickening and mild calcification of the aortic valve. Aortic valve regurgitation is not visualized. The aortic valve is structurally normal, with no evidence of sclerosis or stenosis. There is  mild thickening of the aortic valve. There is mild calcification of the aortic valve. Pulmonic Valve: The pulmonic valve was normal in structure. Pulmonic valve  regurgitation is not visualized. Pulmonic regurgitation is not visualized. Aorta: The aortic root, ascending aorta and aortic arch are all structurally normal, with no evidence of dilitation or obstruction. Venous: The inferior vena cava is normal in size with greater than 50% respiratory variability, suggesting right atrial pressure of 3 mmHg. IAS/Shunts: No atrial level shunt detected by color flow Doppler. There is no evidence of a patent foramen ovale. No ventricular septal defect is seen or detected. There is no evidence of an atrial septal defect.  LEFT VENTRICLE PLAX 2D LVIDd:         5.10 cm       Diastology  LVIDs:         4.60 cm       LV e' lateral:   4.43 cm/s LV PW:         0.60 cm       LV E/e' lateral: 13.3 LV IVS:        0.60 cm       LV e' medial:    3.53 cm/s LVOT diam:     1.60 cm       LV E/e' medial:  16.7 LV SV:         26 ml LV SV Index:   17.91 LVOT Area:     2.01 cm  LV Volumes (MOD) LV area d, A4C:    23.80 cm LV area s, A4C:    21.70 cm LV major d, A4C:   7.02 cm LV major s, A4C:   6.77 cm LV vol d, MOD A4C: 69.3 ml LV vol s, MOD A4C: 59.0 ml LV SV MOD A4C:     69.3 ml RIGHT VENTRICLE RV S prime:     7.13 cm/s TAPSE (M-mode): 0.9 cm LEFT ATRIUM           Index       RIGHT ATRIUM          Index LA diam:      2.50 cm 1.65 cm/m  RA Area:     8.01 cm LA Vol (A2C): 16.2 ml 10.71 ml/m RA Volume:   13.60 ml 8.99 ml/m LA Vol (A4C): 22.3 ml 14.74 ml/m  AORTIC VALVE LVOT Vmax:   64.60 cm/s LVOT Vmean:  45.400 cm/s LVOT VTI:    0.083 m  AORTA Ao Root diam: 3.00 cm MITRAL VALVE                        TRICUSPID VALVE MV Area (PHT): 3.99 cm             TR Peak grad:   17.6 mmHg MV PHT:        55.10 msec           TR Vmax:        210.00 cm/s MV Decel Time: 190 msec MV E velocity: 59.10 cm/s 103 cm/s  SHUNTS MV A velocity: 76.60 cm/s 70.3 cm/s Systemic VTI:  0.08 m MV E/A ratio:  0.77       1.5       Systemic Diam: 1.60 cm  Skeet Latch MD Electronically signed by Skeet Latch MD Signature  Date/Time: 03/10/2019/1:13:59 PM    Final     Anti-infectives: Anti-infectives (From admission, onward)   Start     Dose/Rate Route Frequency Ordered Stop   03/12/19 1200  vancomycin (VANCOREADY) IVPB 750 mg/150 mL     750 mg 150 mL/hr over 60 Minutes Intravenous Every 48 hours 03/10/19 0958     03/10/19 1030  vancomycin (VANCOCIN) IVPB 1000 mg/200 mL premix     1,000 mg 200 mL/hr over 60 Minutes Intravenous  Once 03/10/19 0952 03/10/19 1216   03/09/19 1400  nafcillin 2 g in sodium chloride 0.9 % 100 mL IVPB  Status:  Discontinued     2 g 200 mL/hr over 30 Minutes Intravenous Every 6 hours 03/09/19 1326 03/10/19 0952   03/07/19 0600  cefTRIAXone (ROCEPHIN) 2 g in sodium chloride 0.9 % 100 mL IVPB     2 g 200 mL/hr over 30 Minutes Intravenous Every 24 hours 02/15/2019 2313  02/26/2019 1915  ceFEPIme (MAXIPIME) 2 g in sodium chloride 0.9 % 100 mL IVPB     2 g 200 mL/hr over 30 Minutes Intravenous  Once 02/16/2019 1900 02/25/2019 2044   02/09/2019 1915  metroNIDAZOLE (FLAGYL) IVPB 500 mg     500 mg 100 mL/hr over 60 Minutes Intravenous  Once 02/09/2019 1900 02/09/2019 2120   02/18/2019 1915  vancomycin (VANCOCIN) IVPB 1000 mg/200 mL premix     1,000 mg 200 mL/hr over 60 Minutes Intravenous  Once 02/10/2019 1900 02/09/2019 2151      Assessment/Plan:  1 - 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 Discussed with daughter [name] role of urgent neph tubes to possibly help renal drainage and clear ant GU infections of which signs are presently unclear. Daughter does  NOT want any additional tubes at this point or anything that is unlikely to drasitcally alter her course (in face of new likely metastatic cancer with functional decline). This is keeping with stated goals of care.   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 AGree with pain control.   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 Otherwise no additional procedural intervention per family goals.   4 - End of Life Care - Appreciate palliative consult as does pt's daughter. Daughter and other family would like to try to see pt in AM tomorrow if possible, but does not desire any heroic furhter escalation of care / recussitation if she has CV collapse tonight.   Alexis Frock 03/10/2019

## 2019-03-10 NOTE — Progress Notes (Signed)
Contacted urology  Appreciate assistance, will evaluate patient  Patient is very sick at present Status post cardiorespiratory arrest with PEA lasting about 15 minutes Ejection fraction is down to 15 to 20% Elevated troponins may be related to recent cardiorespiratory arrest, chest compressions Could also signal a cardiac event leading to the cardiorespiratory arrest Urine is blood-tinged at present-I do not feel it safe to start anticoagulation  Multiple comorbidities with possibility of metastatic neoplastic disease, pelvic/ vaginal primary is a possibility-bone lesions suggesting neoplastic process  Severe malnutrition  I did discuss with the daughter today, palliative care also involved with discussions with family  She was made DNR today Compassionate withdrawal of care will be in the patient's best interest in my opinion

## 2019-03-10 NOTE — Progress Notes (Signed)
NAME:  [name] [name], MRN:  [id number], DOB:  June 07, 1940, LOS: 4 ADMISSION DATE:  02/16/2019, CONSULTATION DATE:  03/09/2019 REFERRING MD:  Dr Horris Latino, CHIEF COMPLAINT:  Post cardiac arrest   Brief History   Patient with multiple comorbidities, advanced chronic diseases Being treated for UTI Suffered a cardiorespiratory arrest 03/09/2019 Code lasted about 15 minutes of PEA, received 1 round of epinephrine  History of atrial fibrillation, AAA, congestive heart failure, stage III chronic kidney disease, coronary artery disease, CABG, GERD, history of GI bleed, hypertension, hypothyroidism History of retroperitoneal fibrosis, history of bilateral ureteral strictures Left kidney is nonfunctioning, right kidney has had multiple stents-multiple UTIs Recent concern was for ureteral necrosis from a chronic Foley   Past Medical History   Past Medical History:  Diagnosis Date  . A-fib ([place])   . AAA (abdominal aortic aneurysm) ([place])   . Bilateral hydronephrosis   . Carotid artery stenosis    carotid doppler 06/2012 - Right CCA/Bulb/ICA with chronic occlusion; L vertebral artery with abnormal blood flow; L Bulb/Prox ICA  s/p endarterectomy with mild fibrous plaque, 50% diameter reduction  . CHF (congestive heart failure) ([place])   . Choledocholithiasis 2017  . CKD (chronic kidney disease), stage III   . Coronary artery disease due to lipid rich plaque cardiologist-  dr berry   s/p CABG x6 1997-- cath 12-09-2009 occluded vein to obtuse marginal branch and ramus branch with patent vien to PDA and patent LIMA to LAD, ef 40%-- Myoview 11-24-2011, nonischemic  . Diverticulosis   . Dyspnea on exertion   . GERD (gastroesophageal reflux disease)   . GI bleed   . History of sepsis    10-18-2014 w/ acute pyelonephritis  . Hyperlipidemia   . Hypertension   . Hypothyroidism   . Iron deficiency anemia   . Ischemic cardiomyopathy    03-25-2010-- per lasts echo EF  50-55%  . Ischemic cardiomyopathy     . PAD (peripheral artery disease) ([place])    09/2010 LEAs - R ABI of 0.45, occluded fem-pop bypass graft, L ABI of 0.59 with occluded SFA; severe arterial insuff  . PONV (postoperative nausea and vomiting)   . PVD (peripheral vascular disease) with claudication ([place])    last duplex 07-04-2015 -- Right CCA and ICA chronic occlusion, 65-99% LICA, Patent vertebral arteries w/ antegrade flow, bilateral normal subclavian arteries   . Retroperitoneal fibrosis   . Tubular adenoma of colon   . Type 2 diabetes mellitus ([place])    monitored by dr Dwyane Dee   [facility] Events   Cardiorespiratory arrest 03/09/2019  Consults:  Urology  Procedures:  Endotracheal intubation 03/09/2019 Central line placement 03/09/2019  Significant Diagnostic Tests:  CT abdomen IMPRESSION: 1. No acute intrathoracic pathology. 2. 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 Follow-up in 1 year recommended. 3. Coronary vascular calcification and postsurgical changes of CABG. 4. Partially visualized chronic appearing left hydronephrosis. 5. Small hiatal hernia. 6. Aortic Atherosclerosis (ICD10-I70.0).  Micro Data:  02/23/2019-blood culture-Staphylococcus lugdunensis 03/09/2019-repeat blood culture pending 03/09/2019-urine culture  Antimicrobials:  Cefepime 12/29 Ceftriaxone 12/29>> Metronidazole 12/29 Vancomycin 12/29   Interim history/subjective:  No overnight events Sedated On pressors Objective   Blood pressure (!) 112/45, pulse 73, temperature 97.6 F (36.4 C), temperature source Axillary, resp. rate (!) 29, height 5\' 5"  (1.651 m), weight 48.3 kg, SpO2 98 %. CVP:  [4 mmHg-14 mmHg] 9 mmHg  Vent Mode: PRVC FiO2 (%):  [30 %-100 %] 60 % Set  Rate:  [16 bmp-21 bmp] 21 bmp Vt Set:  [450 mL-460 mL] 460 mL PEEP:  [5 cmH20] 5 cmH20 Plateau Pressure:  [15 cmH20-18 cmH20] 15 cmH20   Intake/Output Summary (Last 24 hours) at 03/10/2019 0858 Last  data filed at 03/10/2019 0813 Gross per 24 hour  Intake 3002.81 ml  Output 425 ml  Net 2577.81 ml   Filed Weights   03/07/19 1730 03/09/19 1240 03/10/19 0450  Weight: 55 kg 48.3 kg 48.3 kg    Examination: General: Chronically ill-appearing, muscle wasting HENT: Dry oral mucosa Lungs: Decreased air movement bilaterally, no added sounds Cardiovascular: S1-S2 appreciated Abdomen: Soft, bowel sounds appreciated Extremities: No clubbing, no edema, some skin necrosis Neuro: Sedated GU: Fair output  Resolved Hospital Problem list     Assessment & Plan:  S/p cardiorespiratory arrest -50 minutes of PEA -Possible aspiration -Hemodynamic support -Ventilator support  Sepsis -On ceftriaxone and nafcillin -Staph lugdunensis on 1 culture -Repeat culture pending -Urine culture pending -Trend CBC -We will de-escalate antibiotics based off of repeat cultures  Bilateral hydronephrosis -Nonfunctional left kidney -Plan for suprapubic catheter -Nephrostomy tube placement  Pathological fractures/concern for metastatic disease -Further evaluation when more stable -Likely as outpatient although very sick at the present time with poor outlook  Chronic kidney disease stage III -Trend electrolytes -Avoid nephrotoxic's  Anemia of chronic disease -Transfuse per protocol  Chronic systolic and diastolic heart failure -Cautious hydration  Hypothyroidism -On Synthroid  Severe malnutrition -Continue enteric support  Diabetes -SSI  Multiple pressure ulcers -Wound consulted  Best practice:  Diet: Feeding per protocol Pain/Anxiety/Delirium protocol (if indicated): Precedex and fentanyl VAP protocol (if indicated): In place DVT prophylaxis: SCD GI prophylaxis: Protonix Glucose control: SSI Mobility: Bedrest Code Status: Full code Family Communication: Will update daughter Disposition: icu  Labs   CBC: Recent Labs  Lab 02/13/2019 1718 03/07/19 0445 03/08/19 0634  03/09/19 0520 03/09/19 1220 03/10/19 0500  WBC 19.4* 14.6* 17.4* 18.4* 26.8* 28.3*  NEUTROABS 17.5*  --  15.3* 16.3* 20.0* 25.4*  HGB 8.6* 7.5* 7.4* 7.2* 8.1* 7.6*  HCT 29.5* 26.0* 25.5* 25.5* 29.1* 27.2*  MCV 103.5* 104.0* 103.7* 106.7* 110.6* 107.9*  PLT 502* 416* 407* 412* 504* 529*    Basic Metabolic Panel: Recent Labs  Lab 03/07/19 0445 03/08/19 0634 03/09/19 0520 03/09/19 1220 03/09/19 1448 03/10/19 0500  NA 140 141 142 142  --  140  K 3.5 4.0 3.7 3.8  --  3.4*  CL 108 107 113* 111  --  113*  CO2 24 23 22  20*  --  17*  GLUCOSE 97 81 101* 132*  --  184*  BUN 72* 60* 50* 47*  --  54*  CREATININE 1.48* 1.29* 1.29* 1.35*  --  1.42*  CALCIUM 7.6* 7.9* 7.9* 8.0*  --  7.2*  MG  --   --   --  2.2 1.8 2.5*  PHOS  --   --   --  3.8 3.5 2.9   GFR: Estimated Creatinine Clearance: 25.3 mL/min (A) (by C-G formula based on SCr of 1.42 mg/dL (H)). Recent Labs  Lab 02/26/2019 1745 02/07/2019 1957 02/27/2019 2326 03/08/19 0634 03/09/19 0520 03/09/19 1220 03/10/19 0500  WBC  --   --   --  17.4* 18.4* 26.8* 28.3*  LATICACIDVEN 2.2* 2.0* 1.4  --   --   --   --     Liver Function Tests: Recent Labs  Lab 03/09/19 1220  AST 104*  ALT 37  ALKPHOS 153*  BILITOT 0.3  PROT 5.0*  ALBUMIN 1.4*   No results for input(s): LIPASE, AMYLASE in the last 168 hours. No results for input(s): AMMONIA in the last 168 hours.  ABG    Component Value Date/Time   PHART 7.303 (L) 03/10/2019 0425   PCO2ART 33.9 03/10/2019 0425   PO2ART 153 (H) 03/10/2019 0425   HCO3 16.4 (L) 03/10/2019 0425   TCO2 17 08/28/2015 1834   ACIDBASEDEF 8.8 (H) 03/10/2019 0425   O2SAT 98.8 03/10/2019 0425     Coagulation Profile: Recent Labs  Lab 03/01/2019 1718 03/09/19 1220  INR 1.1 1.4*    Cardiac Enzymes: No results for input(s): CKTOTAL, CKMB, CKMBINDEX, TROPONINI in the last 168 hours.  HbA1C: Hgb A1c MFr Bld  Date/Time Value Ref Range Status  03/04/2019 11:26 PM 4.9 4.8 - 5.6 % Final     Comment:    (NOTE) Pre diabetes:          5.7%-6.4% Diabetes:              >6.4% Glycemic control for   <7.0% adults with diabetes   11/22/2018 09:41 AM 5.6 4.8 - 5.6 % Final    Comment:    (NOTE)         Prediabetes: 5.7 - 6.4         Diabetes: >6.4         Glycemic control for adults with diabetes: <7.0     CBG: Recent Labs  Lab 03/09/19 1627 03/09/19 1929 03/09/19 2342 03/10/19 0323 03/10/19 0725  GLUCAP 100* 192* 204* 179* 136*    Review of Systems:   Unable to review- sedated on vent  Past Medical History  She,  has a past medical history of A-fib ([place]), AAA (abdominal aortic aneurysm) ([place]), Bilateral hydronephrosis, Carotid artery stenosis, CHF (congestive heart failure) ([place]), Choledocholithiasis (2017), CKD (chronic kidney disease), stage III, Coronary artery disease due to lipid rich plaque (cardiologist-  dr berry), Diverticulosis, Dyspnea on exertion, GERD (gastroesophageal reflux disease), GI bleed, History of sepsis, Hyperlipidemia, Hypertension, Hypothyroidism, Iron deficiency anemia, Ischemic cardiomyopathy, Ischemic cardiomyopathy, PAD (peripheral artery disease) ([place]), PONV (postoperative nausea and vomiting), PVD (peripheral vascular disease) with claudication ([place]), Retroperitoneal fibrosis, Tubular adenoma of colon, and Type 2 diabetes mellitus ([place]).   Surgical History    Past Surgical History:  Procedure Laterality Date  . AORTA - BILATERAL FEMORAL ARTERY BYPASS GRAFT  1997   and RIGHT FEM-POP   . CARDIAC CATHETERIZATION  12-09-2009  dr al little   EF >40%-- occluded vein to OM & ramus branches, patent vein to PDA, patent LIMA to LAD (Dr. [name], [facility]) - later had thrombectomy of R fem-pop bypass ad R common femoral & profunda femoris artery (Dr. Oneida Alar)  . CARDIOVASCULAR STRESS TEST  11-24-2011   dr berry   Low Risk study: fixed basal to mid inferior attenuation artifact, no reversible ischemia,  normal LV function and wall motion , ef 67%  . CAROTID  ENDARTERECTOMY Bilateral right 1994//  left ?  Marland Kitchen CATARACT EXTRACTION W/ INTRAOCULAR LENS  IMPLANT, BILATERAL  2006  . CHOLECYSTECTOMY N/A 09/05/2015   Procedure: ATTEMPTED LAPAROSCOPIC CHOLECYSTECTOMY, EXPLORATORY LAPAROTOMY WITH CHOLECYSTECTOMY;  Surgeon: Autumn Messing III, MD;  Location: [place];  Service: General;  Laterality: N/A;  . COLONOSCOPY    . COLONOSCOPY  10/21/2015  . COLONOSCOPY WITH PROPOFOL N/A 10/21/2015   Procedure: COLONOSCOPY WITH PROPOFOL;  Surgeon: Milus Banister, MD;  Location: [place];  Service: Endoscopy;  Laterality: N/A;  . CORONARY ARTERY BYPASS GRAFT  1997   x6; internal mammary to LAD, SVG to ramus #1 & #2, SVG to OM, SVG to PDA,   . CYSTOSCOPY W/ RETROGRADES Right 08/01/2015   Procedure: CYSTOSCOPY WITH RETROGRADE PYELOGRAM;  Surgeon: Ardis Hughs, MD;  Location: [facility];  Service: Urology;  Laterality: Right;  . CYSTOSCOPY W/ URETERAL STENT PLACEMENT  03/10/2012   Procedure: CYSTOSCOPY WITH RETROGRADE PYELOGRAM/URETERAL STENT PLACEMENT;  Surgeon: Hanley Ben, MD;  Location: [facility];  Service: Urology;  Laterality: Left;  . CYSTOSCOPY W/ URETERAL STENT PLACEMENT Bilateral 03/07/2015   Procedure: BILATERAL RETROGRADE PYELOGRAM AND RIGHT URETERAL STENT PLACEMENT;  Surgeon: Ardis Hughs, MD;  [facility];  Service: Urology;  Laterality: Bilateral;  . CYSTOSCOPY W/ URETERAL STENT PLACEMENT Right 08/01/2015   Procedure: CYSTOSCOPY WITH STENT REPLACEMENT;  Surgeon: Ardis Hughs, MD;  Location: [facility];  Service: Urology;  Laterality: Right;  . CYSTOSCOPY W/ URETERAL STENT PLACEMENT Right 02/13/2016   Procedure: RIGHT URETERAL STENT EXCHANGE;  Surgeon: Ardis Hughs, MD;  Location: [facility];  Service: Urology;  Laterality: Right;  . CYSTOSCOPY W/ URETERAL STENT PLACEMENT Right 09/17/2016   Procedure: CYSTOSCOPY WITH RIGHT  RETROGRADE PYELOGRAM RIGHT URETERAL STENT EXCHANGE;  Surgeon: Ardis Hughs, MD;  Location: [facility];  Service: Urology;  Laterality: Right;  . CYSTOSCOPY W/ URETERAL STENT PLACEMENT Bilateral 03/18/2017   Procedure: CYSTOSCOPY WITH BILATERAL  RETROGRADE PYELOGRAM RIGHT URETERAL STENT [place];  Surgeon: Ardis Hughs, MD;  Location: [facility];  Service: Urology;  Laterality: Bilateral;  . CYSTOSCOPY W/ URETERAL STENT PLACEMENT Right 05/26/2018   Procedure: CYSTOSCOPY WITH RIGHT RETROGRADE PYELOGRAM RIGHT URETERAL STENT EXCHANGE;  Surgeon: Ardis Hughs, MD;  Location: [facility];  Service: Urology;  Laterality: Right;  . CYSTOSCOPY WITH STENT PLACEMENT Right 10/06/2017   Procedure: CYSTOSCOPY, RETROGRADE  WITH RIGHT STENT EXCHANGE;  Surgeon: Ardis Hughs, MD;  Location: [facility];  Service: Urology;  Laterality: Right;  . CYSTOSCOPY WITH STENT PLACEMENT Right 12/01/2018   Procedure: CYSTOSCOPY WITH STENT PLACEMENT;  Surgeon: Ardis Hughs, MD;  Location[facility];  Service: Urology;  Laterality: Right;  . ERCP N/A 09/03/2015   Procedure: ENDOSCOPIC RETROGRADE CHOLANGIOPANCREATOGRAPHY (ERCP);  Surgeon: Irene Shipper, MD;  Location[facility];  Service: Endoscopy;  Laterality: N/A;  . ESOPHAGOGASTRODUODENOSCOPY    . ESOPHAGOGASTRODUODENOSCOPY N/A 10/19/2015   Procedure: ESOPHAGOGASTRODUODENOSCOPY (EGD);  Surgeon: Gatha Mayer, MD;  Location: [facility];  Service: Endoscopy;  Laterality: N/A;  . GIVENS CAPSULE STUDY  10/21/2015  . GIVENS CAPSULE STUDY N/A 10/21/2015   Procedure: GIVENS CAPSULE STUDY;  Surgeon: Milus Banister, MD;  Location: [place];  Service: Endoscopy;  Laterality: N/A;  . KNEE SURGERY Right 03-01-2018-03-04-2018   PLATEAU FRACTURE HOSPITLIZATION   . REPAIR RIGHT FEMORAL PSEUDOANEUYSM/  RIGHT FEM-POP BYPASS GRAFT/  DEBRIDEMENT RIGHT LOWER EXTREMITIY VENOUS STATUS ULCERS X2  01-12-2005  . TOTAL ABDOMINAL HYSTERECTOMY W/ BILATERAL SALPINGOOPHORECTOMY  1986  . TRANSTHORACIC ECHOCARDIOGRAM  03/25/2010   EF 31-51%, LV  systolic function low normal with mild inferoseptal hypocontractility; LA mildly dilated; mod MR; mild TR, RV systolic pressure elevated, mild pulm HTN; AV mildly sclerotic; mild pulm valve regurg; aortic root sclerosis/calcif      Social History   reports that she quit smoking about 9 years ago. She has a 60.00 pack-year smoking history. She has never used smokeless tobacco. She reports current alcohol use of about 3.0 standard drinks of alcohol per week. She reports that  she does not use drugs.   Family History   Her family history includes Cancer in her maternal aunt; Congestive Heart Failure in her mother; Diabetes in her mother; Stroke in her father.   Allergies No Known Allergies    The patient is critically ill with multiple organ systems failure and requires high complexity decision making for assessment and support, frequent evaluation and titration of therapies, application of advanced monitoring technologies and extensive interpretation of multiple databases. Critical Care Time devoted to patient care services described in this note independent of APP/resident time (if applicable)  is 35 minutes.   Sherrilyn Rist MD [facility] Personal pager: [phone] If unanswered, please page CCM On-call: [phone]

## 2019-03-10 NOTE — Progress Notes (Signed)
Pharmacy: Abx Brief note  Adding Cipro for possible urosepsis with recent office UCx with Klebsiella R to Ceftriaxone but sensitive to cipro. CrCl 25 ml/min.   Plan: Cipro 400 mg IV q24 F/u renal fxn and culture data  Eudelia Bunch, Pharm.D [phone] 03/10/2019 6:47 PM

## 2019-03-10 NOTE — Progress Notes (Signed)
Bloody tinged to urine  Will not start anticoagulation

## 2019-03-10 NOTE — Progress Notes (Signed)
Pharmacy Antibiotic Note  [name] [name] is a 79 y.o. female admitted on 02/12/2019 with suprapubic abdominal pain. 1/4 blood cx's shows staph lugdunensis. Coded on 1/1 and transferred to ICU. Susceptibilities came back and show MRSE. May still be contaminant but covering for sepsis/worsening clinical status. Pharmacy has been consulted for vancomycin dosing.  Plan: Stop nafcillin  Give vancomycin 1g IV x 1, then start vancomycin 750mg  IV Q48h. Goal AUC 400-550. Expected AUC: 425 SCr used: 1.42 Continue ceftriaxone 2g IV Q24h Monitor clinical picture, renal function, vanc levels prn F/U C&S, abx deescalation / LOT  Height: 5\' 5"  (165.1 cm) Weight: 106 lb 7.7 oz (48.3 kg) IBW/kg (Calculated) : 57  Temp (24hrs), Avg:97.5 F (36.4 C), Min:96.7 F (35.9 C), Max:98 F (36.7 C)  Recent Labs  Lab 02/26/2019 1745 03/02/2019 1957 02/10/2019 2326 03/07/19 0445 03/08/19 0634 03/09/19 0520 03/09/19 1220 03/10/19 0500  WBC  --   --   --  14.6* 17.4* 18.4* 26.8* 28.3*  CREATININE  --   --   --  1.48* 1.29* 1.29* 1.35* 1.42*  LATICACIDVEN 2.2* 2.0* 1.4  --   --   --   --   --     Estimated Creatinine Clearance: 25.3 mL/min (A) (by C-G formula based on SCr of 1.42 mg/dL (H)).    No Known Allergies  Thank you for allowing pharmacy to be a part of this patient's care.  Reginia Naas 03/10/2019 9:58 AM

## 2019-03-10 NOTE — Progress Notes (Signed)
Antibiotic changed to vancomycin Nafcillin will be discontinued  Obtain cardiac enzymes EKG showing atrial flutter-patient known to have A. fib as a history ST-T wave changes  Will follow echocardiogram Consider anticoagulation

## 2019-03-10 NOTE — Consult Note (Signed)
Palliative care consult  Reason for consult: Goals of care in light of sepsis with cardiac arrest and ventilator dependent respiratory failure  Palliative care consult received.  Chart reviewed including personal review of pertinent labs and imaging.  Discussed with bedside care team including Dr. Ander Slade.  Briefly, [name] is a 79 year old female with past medical history of A. 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  Work-up has revealed likely metastatic lesion in pelvis.  Plan was for nephrostomy tube as well as suprapubic catheter, however, she acutely declined with PEA arrest and is status post ACLS with CPR and respiratory failure resulting in ventilator dependent respiratory failure.  Palliative consulted for goals of care.  I met today with patient's daughter, [name] [name].  I introduced palliative care as specialized medical care for people living with serious illness. It focuses on providing relief from the symptoms and stress of a serious illness. The goal is to improve quality of life for both the patient and the family.  [name] [name] reports that her mother is a very strong, independent woman who raised her as a single mom.  [name] [name] states that she has been consistently the most important thing to her mother as her mother has made great sacrifices to care for her and raise her.  They enjoyed camping, fishing, and the outdoors.  [name] [name] reports that the doctors have been doing a good job explaining things to her and she was able to meet with Dr. Ander Slade today.  She tells me that she understands that her mother is dying.  We discussed clinical course as well as wishes moving forward in regard to advanced directives.   Concepts specific to code status and continuation of mechanical ventilation discussed.  We discussed difference between a aggressive medical intervention path and a palliative, comfort focused care path.   [name] [name] reports that her mother has 2 sisters who live in [place] who she wants to have the opportunity to come and see her mother and say goodbye before withdrawing life support.  She will call them today and see when they will be able to come in the next day or 2.  We discussed that in light of multiple chronic medical problems that have worsened with this acute decline, heroic interventions are unlikely to result in her mother ever being well enough to leave the hospital.  [name] [name] expresses understanding this and does not want her mother to suffer, however, her mother had said that she wanted attempts at resuscitation in the past.  [name] [name] is in agreement with a plan to forego further attempts at resuscitation in the event of another cardiac arrest as we have already attempted resuscitation at the time of her last cardiac arrest and it does not appear to be recoverable event.  [name] [name] is in agreement with changing CODE STATUS to DO NOT RESUSCITATE.  Questions and concerns addressed.   PMT will continue to support holistically.  -DNR in the event of another cardiac arrest -Otherwise, continue with current interventions while her daughter discusses with her sisters regarding timing of coming to the hospital for compassionate extubation. -I provided my card to her daughter, [name] [name], who will let me know when family will be able to come for visit and one-way extubation.  Time in: 1130 Time out: 1230  Total  time: 60 minutes  Greater than 50%  of this time was spent counseling and coordinating care related to the above assessment and plan.  Micheline Rough, MD [place] Team [phone]

## 2019-03-10 NOTE — Progress Notes (Signed)
  Echocardiogram 2D Echocardiogram has been performed.  [name] [name] 03/10/2019, 8:48 AM

## 2019-03-11 DIAGNOSIS — L89890 Pressure ulcer of other site, unstageable: Secondary | ICD-10-CM

## 2019-03-11 LAB — GLUCOSE, CAPILLARY
Glucose-Capillary: 220 mg/dL — ABNORMAL HIGH (ref 70–99)
Glucose-Capillary: 194 mg/dL — ABNORMAL HIGH (ref 70–99)
Glucose-Capillary: 163 mg/dL — ABNORMAL HIGH (ref 70–99)

## 2019-03-11 LAB — CBC WITH DIFFERENTIAL/PLATELET
Abs Immature Granulocytes: 0.62 10*3/uL — ABNORMAL HIGH (ref 0.00–0.07)
Basophils Absolute: 0.1 10*3/uL (ref 0.0–0.1)
Basophils Relative: 0 %
Eosinophils Absolute: 0 10*3/uL (ref 0.0–0.5)
Eosinophils Relative: 0 %
HCT: 24.9 % — ABNORMAL LOW (ref 36.0–46.0)
Hemoglobin: 7.1 g/dL — ABNORMAL LOW (ref 12.0–15.0)
Immature Granulocytes: 2 %
Lymphocytes Relative: 4 %
Lymphs Abs: 1.1 10*3/uL (ref 0.7–4.0)
MCH: 31 pg (ref 26.0–34.0)
MCHC: 28.5 g/dL — ABNORMAL LOW (ref 30.0–36.0)
MCV: 108.7 fL — ABNORMAL HIGH (ref 80.0–100.0)
Monocytes Absolute: 1.5 10*3/uL — ABNORMAL HIGH (ref 0.1–1.0)
Monocytes Relative: 5 %
Neutro Abs: 26.5 10*3/uL — ABNORMAL HIGH (ref 1.7–7.7)
Neutrophils Relative %: 89 %
Platelets: 442 10*3/uL — ABNORMAL HIGH (ref 150–400)
RBC: 2.29 MIL/uL — ABNORMAL LOW (ref 3.87–5.11)
RDW: 18.7 % — ABNORMAL HIGH (ref 11.5–15.5)
WBC: 29.7 10*3/uL — ABNORMAL HIGH (ref 4.0–10.5)
nRBC: 0.1 % (ref 0.0–0.2)

## 2019-03-11 LAB — BASIC METABOLIC PANEL
Anion gap: 9 (ref 5–15)
BUN: 67 mg/dL — ABNORMAL HIGH (ref 8–23)
CO2: 17 mmol/L — ABNORMAL LOW (ref 22–32)
Calcium: 7 mg/dL — ABNORMAL LOW (ref 8.9–10.3)
Chloride: 114 mmol/L — ABNORMAL HIGH (ref 98–111)
Creatinine, Ser: 1.54 mg/dL — ABNORMAL HIGH (ref 0.44–1.00)
GFR calc Af Amer: 37 mL/min — ABNORMAL LOW (ref >60)
GFR calc non Af Amer: 32 mL/min — ABNORMAL LOW (ref >60)
Glucose, Bld: 206 mg/dL — ABNORMAL HIGH (ref 70–99)
Potassium: 3.2 mmol/L — ABNORMAL LOW (ref 3.5–5.1)
Sodium: 140 mmol/L (ref 135–145)

## 2019-03-11 MED ORDER — LORAZEPAM 2 MG/ML IJ SOLN: 1.0000 mg | INTRAMUSCULAR | Status: DC | PRN

## 2019-03-11 MED ORDER — PANTOPRAZOLE SODIUM 40 MG IV SOLR
40.0000 mg | Freq: Every day | INTRAVENOUS | Status: DC
  Administered 2019-03-11: 06:00:00 40 mg via INTRAVENOUS
  Filled 2019-03-11: qty 40

## 2019-03-11 MED ORDER — ACETAMINOPHEN 325 MG PO TABS: 650.0000 mg | ORAL_TABLET | Freq: Four times a day (QID) | ORAL | Status: DC | PRN

## 2019-03-11 MED ORDER — MORPHINE BOLUS VIA INFUSION
2.0000 mg | INTRAVENOUS | Status: DC | PRN
  Filled 2019-03-11: qty 4

## 2019-03-11 MED ORDER — GLYCOPYRROLATE 0.2 MG/ML IJ SOLN: 0.2000 mg | INTRAMUSCULAR | Status: DC | PRN

## 2019-03-11 MED ORDER — DIPHENHYDRAMINE HCL 50 MG/ML IJ SOLN: 25.0000 mg | INTRAMUSCULAR | Status: DC | PRN

## 2019-03-11 MED ORDER — MORPHINE 100MG IN NS 100ML (1MG/ML) PREMIX INFUSION
1.0000 mg/h | INTRAVENOUS | Status: DC
  Administered 2019-03-11: 5 mg/h via INTRAVENOUS
  Filled 2019-03-11: qty 100

## 2019-03-11 MED ORDER — GLYCOPYRROLATE 1 MG PO TABS: 1.0000 mg | ORAL_TABLET | ORAL | Status: DC | PRN

## 2019-03-11 MED ORDER — DEXTROSE 5 % IV SOLN: INTRAVENOUS | Status: DC

## 2019-03-11 MED ORDER — POLYVINYL ALCOHOL 1.4 % OP SOLN: 1.0000 [drp] | Freq: Four times a day (QID) | OPHTHALMIC | Status: DC | PRN

## 2019-03-11 MED ORDER — ACETAMINOPHEN 650 MG RE SUPP: 650.0000 mg | Freq: Four times a day (QID) | RECTAL | Status: DC | PRN

## 2019-03-12 LAB — URINE CULTURE
Culture: 50000 — AB
Special Requests: NORMAL

## 2019-03-12 LAB — CULTURE, BLOOD (ROUTINE X 2)
Culture: NO GROWTH
Special Requests: ADEQUATE

## 2019-03-14 LAB — CULTURE, BLOOD (ROUTINE X 2)
Culture: NO GROWTH
Culture: NO GROWTH
Special Requests: ADEQUATE
Special Requests: ADEQUATE

## 2019-04-09 NOTE — Progress Notes (Signed)
[facility] Progress Note Patient Name: [name] [name] DOB: September 15, 1940 MRN: [id number]   Date of Service  03/15/19  HPI/Events of Note  Intubated and ventilated - Request for Stress Ulcer Prophylaxis.   eICU Interventions  Will order Protonix IV.      Intervention Category Intermediate Interventions: Best-practice therapies (e.g. DVT, beta blocker, etc.)  Kellen Dutch [place] 03/15/19, 5:34 AM

## 2019-04-09 NOTE — Death Summary Note (Signed)
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 year old female who was admitted for suprapubic abdominal pain. Was being treated for sepsis secondary to UTI. Suffered a cardiorespiratory arrest on 03/09/2019, 15 minutes of PEA, achieved ROSC Required pressors to maintain blood pressure With continuing decompensation, discussion was had with family Patient was subsequently made comfort measures only and was terminally extubated Patient succumbed to her illness on 25-Mar-2019 at 12:57 PM    Pertinent Labs and Studies  Significant Diagnostic Studies DG Chest 1 View  Result Date: 03/09/2019 CLINICAL DATA:  Tube placement EXAM: CHEST  1 VIEW COMPARISON:  March 09, 2019 FINDINGS: The heart size is stable from prior study. The patient is status post prior median sternotomy. The endotracheal tube terminates approximately 2.8 cm above the carina. There is a  right subclavian catheter with tip terminating near the cavoatrial junction. There is no right-sided pneumothorax. Dense calcifications are again noted at the left lung base. There is an enteric tube that extends below the left hemidiaphragm IMPRESSION: Lines and tubes as above. No pneumothorax. Otherwise, no significant interval change. Electronically Signed   By: Constance Holster M.D.   On: 03/09/2019 17:57   DG Abd 1 View  Result Date: 03/09/2019 CLINICAL DATA:  Orogastric tube placement. EXAM: ABDOMEN - 1 VIEW COMPARISON:  03/20/19. FINDINGS: Distal tip of enteric tube is seen in expected position of distal stomach. Right ureteral stent is again noted. No significant bowel dilatation is noted. IMPRESSION: Distal tip of enteric tube is seen in expected position of distal stomach. Electronically Signed   By: Marijo Conception M.D.   On: 03/09/2019 13:05   CT CHEST WO CONTRAST  Result Date: 03/07/2019 CLINICAL DATA:  79 year old female with UTI and sepsis. EXAM: CT CHEST WITHOUT CONTRAST TECHNIQUE: Multidetector CT imaging of the chest was performed following the standard protocol without IV contrast. COMPARISON:  Chest CT dated 08/28/2015. FINDINGS: Evaluation of this exam is limited in the absence of intravenous contrast. Cardiovascular: There is no cardiomegaly or pericardial effusion. 3 vessel coronary vascular calcification and postsurgical changes of CABG. There is advanced atherosclerotic calcification of the thoracic aorta. Similar appearance of aneurysmal dilatation of the descending thoracic aorta measuring up to 4.3 cm in greatest diameter. There is hypoattenuation of the cardiac blood pool suggestive of a degree of anemia. Clinical correlation is recommended. Mildly dilated main pulmonary trunk suggestive of a degree of pulmonary hypertension. Mediastinum/Nodes: There is no hilar or mediastinal adenopathy. Small hiatal hernia. The esophagus is grossly unremarkable. No mediastinal fluid  collection. Lungs/Pleura: Left sided talc pleurodesis similar to prior CT. Chronic right lung base pleural thickening/scarring. There is an 8 mm subpleural nodule in the right lower lobe, stable. Additional smaller nodules in the right upper lobe similar to prior CT. No new consolidation or pneumothorax. The central airways are patent. Upper Abdomen: Partially visualized chronic appearing left hydronephrosis. Small pocket of pneumobilia. Musculoskeletal: Median sternotomy wires. Degenerative changes of the spine. No acute osseous pathology. IMPRESSION: 1. No acute intrathoracic pathology. 2. 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 Follow-up in 1 year recommended. 3. Coronary vascular calcification and postsurgical changes of CABG. 4. Partially visualized chronic appearing left hydronephrosis. 5. Small hiatal hernia. 6. Aortic Atherosclerosis (ICD10-I70.0). Electronically Signed   By: Anner Crete M.D.   On: 03/07/2019 10:17   DG CHEST PORT 1 VIEW  Result Date: 03/10/2019 CLINICAL DATA:  Respiratory failure. EXAM: PORTABLE CHEST 1 VIEW COMPARISON:  Radiograph yesterday. Chest CT 03/07/2019 FINDINGS: Endotracheal tube tip just at the thoracic inlet. Enteric tube tip below the diaphragm not included in the field of view. Right subclavian central line tip in the SVC. Post median sternotomy and CABG. Left basilar scarring with pleural calcification. Minimal streaky opacities in the right lung base favor atelectasis. No pulmonary edema or pneumothorax. Stable osseous structures. IMPRESSION: 1. Minimal streaky opacities at the right lung base, favoring atelectasis. 2. Otherwise no change from prior exam.  Stable support apparatus. Electronically Signed   By: Keith Rake M.D.   On: 03/10/2019 04:33   DG Chest Port 1 View  Result Date: 03/09/2019 CLINICAL DATA:  Status post cardiac arrest. Status post intubation. EXAM: PORTABLE CHEST 1 VIEW  COMPARISON:  03/08/2019 FINDINGS: ETT tip terminates above the level of the carina. Heart size is normal. Aortic atherosclerosis. Chronic calcification within the left base. No pleural effusion or edema. No airspace opacities. IMPRESSION: 1. No acute cardiopulmonary abnormalities. 2. ETT tip terminates above the level of the carina. Electronically Signed   By: Kerby Moors M.D.   On: 03/09/2019 13:06   DG Chest Port 1 View  Result Date: 02/20/2019 CLINICAL DATA:  Sepsis.  Congestive heart failure. EXAM: PORTABLE CHEST 1 VIEW COMPARISON:  02/12/2019 FINDINGS: Previous median sternotomy and CABG. Heart size is normal. Chronic aortic atherosclerosis. Right lung remains clear. Chronic pleural and parenchymal scarring the left lung base with pleural calcification. No sign of active infiltrate. No acute bone finding. IMPRESSION: No active disease. Previous CABG. Aortic atherosclerosis. Chronic pleural and parenchymal scarring at the left lung base with extensive pleural calcification. Electronically Signed   By: Nelson Chimes M.D.   On: 02/22/2019 20:24   ECHOCARDIOGRAM COMPLETE  Result Date: 03/10/2019   ECHOCARDIOGRAM REPORT   Patient Name:   [name] [name] Tucci Date of Exam: 03/10/2019 Medical Rec #:  [id number]        Height:       65.0 in Accession #:    [id number]       Weight:       106.5 lb Date of Birth:  04-Jul-1940        BSA:          1.51 m Patient Age:    37 years         BP:           111/51 mmHg Patient Gender: F                HR:  58 bpm. Exam Location:  Inpatient Procedure: 2D Echo, Cardiac Doppler and Color Doppler Indications:    Endocarditis I38  History:        Patient has prior history of Echocardiogram examinations, most                 recent 04/15/2016. CAD, Prior CABG, Arrythmias:Atrial Flutter;                 Risk Factors:Hypertension, Diabetes and Former Smoker. Sepsis.                 AAA. PAD.  Sonographer:    Paulita Fujita RDCS Referring Phys: [id number] [place] Comments: Echo performed with patient supine and on artificial respirator. IMPRESSIONS  1. Left ventricular ejection fraction, by visual estimation, is 15-20%. The left ventricle has severely decreased function. Left ventricular septal wall thickness was normal. Normal left ventricular posterior wall thickness. There is no left ventricular  hypertrophy.  2. Elevated left ventricular end-diastolic pressure.  3. Left ventricular diastolic parameters are consistent with Grade I diastolic dysfunction (impaired relaxation).  4. Small left ventricular internal cavity size.  5. The left ventricle demonstrates global hypokinesis.  6. Diffuse hypokinesis worse in the basal to mid septal and inferior myocardium.  7. Global right ventricle has normal systolic function.The right ventricular size is normal. No increase in right ventricular wall thickness.  8. Left atrial size was normal.  9. Right atrial size was normal. 10. Mild mitral annular calcification. 11. The mitral valve is normal in structure. Trivial mitral valve regurgitation. No evidence of mitral stenosis. 12. The tricuspid valve is normal in structure. 13. The aortic valve is tricuspid. Aortic valve regurgitation is not visualized. No evidence of aortic valve sclerosis or stenosis. 14. The pulmonic valve was normal in structure. Pulmonic valve regurgitation is not visualized. 15. Normal pulmonary artery systolic pressure. 16. The inferior vena cava is normal in size with greater than 50% respiratory variability, suggesting right atrial pressure of 3 mmHg. FINDINGS  Left Ventricle: Left ventricular ejection fraction, by visual estimation, is 15-20%. The left ventricle has severely decreased function. The left ventricle demonstrates global hypokinesis. The left ventricular internal cavity size was the LV cavity size  is small. Normal left ventricular posterior wall thickness. There is no left ventricular hypertrophy. Left ventricular diastolic parameters  are consistent with Grade I diastolic dysfunction (impaired relaxation). Elevated left ventricular end-diastolic pressure. Diffuse hypokinesis worse in the basal to mid septal and inferior myocardium. Right Ventricle: The right ventricular size is normal. No increase in right ventricular wall thickness. Global RV systolic function is has normal systolic function. The tricuspid regurgitant velocity is 2.10 m/s, and with an assumed right atrial pressure  of 3 mmHg, the estimated right ventricular systolic pressure is normal at 20.6 mmHg. Left Atrium: Left atrial size was normal in size. Right Atrium: Right atrial size was normal in size Pericardium: There is no evidence of pericardial effusion. Mitral Valve: The mitral valve is normal in structure. Mild mitral annular calcification. Trivial mitral valve regurgitation. No evidence of mitral valve stenosis by observation. Tricuspid Valve: The tricuspid valve is normal in structure. Tricuspid valve regurgitation is trivial. Aortic Valve: The aortic valve is tricuspid. . There is mild thickening and mild calcification of the aortic valve. Aortic valve regurgitation is not visualized. The aortic valve is structurally normal, with no evidence of sclerosis or stenosis. There is  mild thickening of the aortic valve. There is mild calcification of the  aortic valve. Pulmonic Valve: The pulmonic valve was normal in structure. Pulmonic valve regurgitation is not visualized. Pulmonic regurgitation is not visualized. Aorta: The aortic root, ascending aorta and aortic arch are all structurally normal, with no evidence of dilitation or obstruction. Venous: The inferior vena cava is normal in size with greater than 50% respiratory variability, suggesting right atrial pressure of 3 mmHg. IAS/Shunts: No atrial level shunt detected by color flow Doppler. There is no evidence of a patent foramen ovale. No ventricular septal defect is seen or detected. There is no evidence of an atrial  septal defect.  LEFT VENTRICLE PLAX 2D LVIDd:         5.10 cm       Diastology LVIDs:         4.60 cm       LV e' lateral:   4.43 cm/s LV PW:         0.60 cm       LV E/e' lateral: 13.3 LV IVS:        0.60 cm       LV e' medial:    3.53 cm/s LVOT diam:     1.60 cm       LV E/e' medial:  16.7 LV SV:         26 ml LV SV Index:   17.91 LVOT Area:     2.01 cm  LV Volumes (MOD) LV area d, A4C:    23.80 cm LV area s, A4C:    21.70 cm LV major d, A4C:   7.02 cm LV major s, A4C:   6.77 cm LV vol d, MOD A4C: 69.3 ml LV vol s, MOD A4C: 59.0 ml LV SV MOD A4C:     69.3 ml RIGHT VENTRICLE RV S prime:     7.13 cm/s TAPSE (M-mode): 0.9 cm LEFT ATRIUM           Index       RIGHT ATRIUM          Index LA diam:      2.50 cm 1.65 cm/m  RA Area:     8.01 cm LA Vol (A2C): 16.2 ml 10.71 ml/m RA Volume:   13.60 ml 8.99 ml/m LA Vol (A4C): 22.3 ml 14.74 ml/m  AORTIC VALVE LVOT Vmax:   64.60 cm/s LVOT Vmean:  45.400 cm/s LVOT VTI:    0.083 m  AORTA Ao Root diam: 3.00 cm MITRAL VALVE                        TRICUSPID VALVE MV Area (PHT): 3.99 cm             TR Peak grad:   17.6 mmHg MV PHT:        55.10 msec           TR Vmax:        210.00 cm/s MV Decel Time: 190 msec MV E velocity: 59.10 cm/s 103 cm/s  SHUNTS MV A velocity: 76.60 cm/s 70.3 cm/s Systemic VTI:  0.08 m MV E/A ratio:  0.77       1.5       Systemic Diam: 1.60 cm  Skeet Latch MD Electronically signed by Skeet Latch MD Signature Date/Time: 03/10/2019/1:13:59 PM    Final    CT Renal Stone Study  Result Date: 02/24/2019 CLINICAL DATA:  Abdominal and back pain for the past 3 weeks. EXAM: CT ABDOMEN AND PELVIS WITHOUT CONTRAST TECHNIQUE: Multidetector CT  imaging of the abdomen and pelvis was performed following the standard protocol without IV contrast. COMPARISON:  Renal ultrasound from same day. CT abdomen pelvis dated December 25, 2018. FINDINGS: Lower chest: No acute abnormality. Unchanged aneurysmal dilatation of the descending distal thoracic aorta measuring  up to 4.2 cm. Unchanged left pleural calcifications. Hepatobiliary: No focal liver abnormality. Status post cholecystectomy. Unchanged pneumobilia. Unchanged mild common bile duct dilatation. Pancreas: Mildly atrophic. No ductal dilatation or surrounding inflammatory changes. Spleen: Normal in size without focal abnormality. Adrenals/Urinary Tract: The right adrenal gland is not clearly identified. Normal left adrenal gland. Unchanged severe left hydroureteronephrosis and renal cortical thinning. Unchanged right double-J ureteral stent. Slightly worsened moderate right hydronephrosis. The bladder is decompressed by Foley catheter. Stomach/Bowel: Unchanged small hiatal hernia. The stomach is otherwise within normal limits. No bowel wall thickening, distention, or surrounding inflammatory changes. Colonic diverticulosis. The appendix is not identified but there are no signs of inflammation at the base of the cecum. Vascular/Lymphatic: Extensive aortoiliac and branch vessel atherosclerosis. Prior aortobifemoral bypass graft. No enlarged abdominal or pelvic lymph nodes. Reproductive: Status post hysterectomy. No adnexal masses. Other: No free fluid or pneumoperitoneum. Unchanged diastasis of the right anterior abdominal wall. Musculoskeletal: New cortical irregularity and bony destruction of the right superior pubic ramus with associated minimally displaced acute pathologic fracture. New 2.4 x 1.1 cm mass with small soft tissue component involving the right inferior pubic ramus with associated nondisplaced acute pathologic fracture. Chronic T12 compression deformity. IMPRESSION: 1. New cortical irregularity and bony destruction of the right pubic rami with pathologic fractures, concerning for underlying metastatic disease. 2. Slightly worsened moderate right hydronephrosis with unchanged right double-J ureteral stent. Unchanged severe left hydroureteronephrosis. 3. Unchanged distal descending thoracic aortic aneurysm  measuring 4.2 cm. 4.  Aortic atherosclerosis (ICD10-I70.0). Electronically Signed   By: Titus Dubin M.D.   On: 02/10/2019 18:53    Microbiology Recent Results (from the past 240 hour(s))  Blood Culture (routine x 2)     Status: None   Collection Time: 02/18/2019  7:00 PM   Specimen: BLOOD  Result Value Ref Range Status   Specimen Description   Final    BLOOD LEFT ANTECUBITAL Performed at [facility] [address] [place], [place] [address]    Special Requests   Final    BOTTLES DRAWN AEROBIC AND ANAEROBIC Blood Culture adequate volume Performed at [facility] [address] [place], [place] [address]    Culture   Final    NO GROWTH 5 DAYS Performed at [facility], [place] [address], [place], [place] [address]    Report Status 03/12/2019 FINAL  Final  Urine culture     Status: Abnormal   Collection Time: 03/01/2019  7:57 PM   Specimen: Urine, Clean Catch  Result Value Ref Range Status   Specimen Description   Final    URINE, CLEAN CATCH Performed at [facility], [place] [address] [place], [place] [address]    Special Requests   Final    NONE Performed at [facility], [place] [address], [place], [place] [address]    Culture MULTIPLE SPECIES PRESENT, SUGGEST RECOLLECTION (A)  Final   Report Status 03/07/2019 FINAL  Final  Blood Culture (routine x 2)     Status: Abnormal   Collection Time: 02/22/2019  7:57 PM   Specimen: BLOOD  Result Value Ref Range Status   Specimen Description   Final    BLOOD RIGHT ANTECUBITAL Performed  at [facility], [place] [address] [place], [place] [address]    Special Requests   Final    BOTTLES DRAWN AEROBIC AND ANAEROBIC Blood Culture adequate volume Performed at [facility] [address] [place], [place] [address]    Culture  Setup Time   Final    AEROBIC BOTTLE ONLY GRAM POSITIVE COCCI CRITICAL RESULT CALLED TO, READ  BACK BY AND VERIFIED WITH:  Lavell Luster [facility] 03/08/19 0105 JDW Performed at [facility], [place] [address] [place], [place] [address]    Culture STAPHYLOCOCCUS LUGDUNENSIS (A)  Final   Report Status 03/10/2019 FINAL  Final   Organism ID, Bacteria STAPHYLOCOCCUS LUGDUNENSIS  Final      Susceptibility   Staphylococcus lugdunensis - MIC*    CIPROFLOXACIN <=0.5 SENSITIVE Sensitive     ERYTHROMYCIN <=0.25 SENSITIVE Sensitive     GENTAMICIN <=0.5 SENSITIVE Sensitive     OXACILLIN >=4 RESISTANT Resistant     TETRACYCLINE <=1 SENSITIVE Sensitive     VANCOMYCIN <=0.5 SENSITIVE Sensitive     TRIMETH/SULFA <=10 SENSITIVE Sensitive     CLINDAMYCIN <=0.25 SENSITIVE Sensitive     RIFAMPIN <=0.5 SENSITIVE Sensitive     Inducible Clindamycin NEGATIVE Sensitive     * STAPHYLOCOCCUS LUGDUNENSIS  SARS CORONAVIRUS 2 (TAT 6-24 HRS) Nasopharyngeal Nasopharyngeal Swab     Status: None   Collection Time: 03/07/19 10:27 AM   Specimen: Nasopharyngeal Swab  Result Value Ref Range Status   SARS Coronavirus 2 NEGATIVE NEGATIVE Final    Comment: (NOTE) SARS-CoV-2 target nucleic acids are NOT DETECTED. The SARS-CoV-2 RNA is generally detectable in upper and lower respiratory specimens during the acute phase of infection. Negative results do not preclude SARS-CoV-2 infection, do not rule out co-infections with other pathogens, and should not be used as the sole basis for treatment or other patient management decisions. Negative results must be combined with clinical observations, patient history, and epidemiological information. The expected result is Negative. Fact Sheet for Patients: SugarRoll.be Fact Sheet for Healthcare Providers: https://www.woods-mathews.com/ This test is not yet approved or cleared by the [place] FDA and  has been authorized for detection and/or diagnosis of SARS-CoV-2 by FDA under an Emergency Use Authorization (EUA). This EUA will  remain  in effect (meaning this test can be used) for the duration of the COVID-19 declaration under Section 56 4(b)(1) of the Act, 21 U.S.C. section 360bbb-3(b)(1), unless the authorization is terminated or revoked sooner. Performed at [facility], [place] [address] [place], [place] [address]   MRSA PCR Screening     Status: None   Collection Time: 03/07/19  3:52 PM   Specimen: Nasal Mucosa; Nasopharyngeal  Result Value Ref Range Status   MRSA by PCR NEGATIVE NEGATIVE Final    Comment:        The GeneXpert MRSA Assay (FDA approved for NASAL specimens only), is one component of a comprehensive MRSA colonization surveillance program. It is not intended to diagnose MRSA infection nor to guide or monitor treatment for MRSA infections. Performed at [facility], [place] [address] [place], [place] [address]   Culture, blood (routine x 2)     Status: None   Collection Time: 03/09/19  2:48 PM   Specimen: BLOOD LEFT HAND  Result Value Ref Range Status   Specimen Description   Final    BLOOD LEFT HAND Performed at [facility] [address] [place], [place] [address]    Special Requests  Final    BOTTLES DRAWN AEROBIC ONLY Blood Culture adequate volume Performed at [facility] [address], [place], [place] [address]    Culture   Final    NO GROWTH 5 DAYS Performed at [facility], [place] [address], [place], [place] [address]    Report Status 03/14/2019 FINAL  Final  Culture, blood (routine x 2)     Status: None   Collection Time: 03/09/19  2:48 PM   Specimen: BLOOD RIGHT HAND  Result Value Ref Range Status   Specimen Description   Final    BLOOD RIGHT HAND Performed at [facility] [address], [place], [place] [address]    Special Requests   Final    BOTTLES DRAWN AEROBIC ONLY Blood Culture adequate volume Performed at [facility] [address],  [place], [place] [address]    Culture   Final    NO GROWTH 5 DAYS Performed at [facility], [place] [address], [place], [place] [address]    Report Status 03/14/2019 FINAL  Final  Culture, Urine     Status: Abnormal   Collection Time: 03/09/19  3:00 PM   Specimen: Urine, Catheterized  Result Value Ref Range Status   Specimen Description   Final    URINE, CATHETERIZED Performed at [facility] [address] [place], [place] [address]    Special Requests   Final    ceftriaxone Normal Performed at [facility] [address], [place], [place] [address]    Culture (A)  Final    50,000 COLONIES/mL VANCOMYCIN RESISTANT ENTEROCOCCUS ISOLATED   Report Status 03/12/2019 FINAL  Final   Organism ID, Bacteria VANCOMYCIN RESISTANT ENTEROCOCCUS ISOLATED (A)  Final      Susceptibility   Vancomycin resistant enterococcus isolated - MIC*    AMPICILLIN <=2 SENSITIVE Sensitive     NITROFURANTOIN <=16 SENSITIVE Sensitive     VANCOMYCIN >=32 RESISTANT Resistant     LINEZOLID 2 SENSITIVE Sensitive     * 50,000 COLONIES/mL VANCOMYCIN RESISTANT ENTEROCOCCUS ISOLATED    Lab Basic Metabolic Panel: Recent Labs  Lab 03/09/19 0520 03/09/19 1220 03/09/19 1448 03/10/19 0500 03/10/19 1733 04/02/19 0500  NA 142 142  --  140  --  140  K 3.7 3.8  --  3.4*  --  3.2*  CL 113* 111  --  113*  --  114*  CO2 22 20*  --  17*  --  17*  GLUCOSE 101* 132*  --  184*  --  206*  BUN 50* 47*  --  54*  --  67*  CREATININE 1.29* 1.35*  --  1.42*  --  1.54*  CALCIUM 7.9* 8.0*  --  7.2*  --  7.0*  MG  --  2.2 1.8 2.5* 2.3  --   PHOS  --  3.8 3.5 2.9 2.0*  --    Liver Function Tests: Recent Labs  Lab 03/09/19 1220  AST 104*  ALT 37  ALKPHOS 153*  BILITOT 0.3  PROT 5.0*  ALBUMIN 1.4*   No results for input(s): LIPASE, AMYLASE in the last 168 hours. No results for input(s): AMMONIA in the last 168 hours. CBC: Recent Labs  Lab 03/09/19 0520 03/09/19 1220  03/10/19 0500 April 02, 2019 0500  WBC 18.4* 26.8* 28.3* 29.7*  NEUTROABS 16.3* 20.0* 25.4* 26.5*  HGB 7.2* 8.1* 7.6* 7.1*  HCT 25.5* 29.1* 27.2* 24.9*  MCV 106.7* 110.6* 107.9* 108.7*  PLT  412* 504* 529* 442*   Cardiac Enzymes: No results for input(s): CKTOTAL, CKMB, CKMBINDEX, TROPONINI in the last 168 hours. Sepsis Labs: Recent Labs  Lab 03/09/19 0520 03/09/19 1220 03/10/19 0500 2019/03/26 0500  WBC 18.4* 26.8* 28.3* 29.7*    Procedures/Operations  Endotracheal intubation 03/09/2019 Central line placement 03/09/2019   [name] [name] 03/15/2019, 10:31 AM

## 2019-04-09 NOTE — Progress Notes (Signed)
Initial Nutrition Assessment  DOCUMENTATION CODES:   Underweight  INTERVENTION:   -Continue Vital HP @ 40 ml/hr -D/c 30 ml Prostat BID -Provides 960 kcals, 84g protein and 802 ml H2O.  NUTRITION DIAGNOSIS:   Inadequate oral intake related to inability to eat as evidenced by NPO status  GOAL:   Patient will meet greater than or equal to 90% of their needs  MONITOR:   Vent status, Labs, Weight trends, TF tolerance, I & O's(GOC)  REASON FOR ASSESSMENT:   Consult, Ventilator Enteral/tube feeding initiation and management  ASSESSMENT:   79 y.o. female with medical history significant of A. fib, AAA, CHF, CKD stage III, CAD status post CABG, GERD, history of GI bleed, hypertension, hyperlipidemia, hypothyroidism, PVD, type 2 diabetes presenting with complaints of suprapubic abdominal pain and right-sided buttock/pelvic pain.  12/29: admitted for sepsis 1/1: intubated  **RD working remotely**  Per MD notes, pt's family expected to come today to discuss [facility], considering compassionate extubation given poor prognosis.  Will leave TF orders the same given possible extubation. Will continue to monitor plan going forward. Pt receiving Vital HP @ 40 ml/hr with 30 ml Prostat BID (providing 1160 kcals and 114g protein). Will d/c Prostat supplements.  Patient is currently intubated on ventilator support MV: 10.5 L/min Temp (24hrs), Avg:98.9 F (37.2 C), Min:97.8 F (36.6 C), Max:99.6 F (37.6 C)  Admission weight: 121 lbs. Current weight: 106 lbs.   I/Os: +6.7L since admit UOP: 400 ml x 24 hrs  Medications: Vitamin C tablet, OSCAL w/ D tablet, Ferrous sulfate, Fentanyl infusion, Lactated Ringers infusion, Levophed infusion  Labs reviewed: CBGs: 194 Low K  NUTRITION - FOCUSED PHYSICAL EXAM:  Working remotely.  Diet Order:   Diet Order    None      EDUCATION NEEDS:   No education needs have been identified at this time  Skin:  Skin Assessment: Skin Integrity  Issues: Skin Integrity Issues:: Stage II, Unstageable Stage II: coccyx Unstageable: right heel, left heel  Last BM:  12/29  Height:   Ht Readings from Last 1 Encounters:  03/09/19 5\' 5"  (1.651 m)    Weight:   Wt Readings from Last 1 Encounters:  2019-03-28 48.3 kg    Ideal Body Weight:  56.8 kg  BMI:  Body mass index is 17.72 kg/m.  Estimated Nutritional Needs:   Kcal:  1328  Protein:  70-80g  Fluid:  1.5L/day  Clayton Bibles, MS, RD, LDN Inpatient Clinical Dietitian Pager: [phone] After Hours Pager: [phone]

## 2019-04-09 NOTE — Procedures (Signed)
Extubation Procedure Note  Patient Details:   Name: [name] [name] DOB: 1940/11/29 MRN: [id number]   Airway Documentation:    Vent end date: 04/02/2019 Vent end time: 1300   Evaluation  O2 sats: transiently fell during during procedure Complications: No apparent complications Patient did tolerate procedure well. Bilateral Breath Sounds: Clear, Diminished   No  Baldwin [place] Nannette 04-02-19, 1:07 PM  One way extubation per MD.

## 2019-04-09 NOTE — Progress Notes (Signed)
Daily Progress Note   Patient Name: [name] [name]       Date: 03/17/19 DOB: 1940/08/24  Age: 79 y.o. MRN#: [id number] Attending Physician: [name] Coder, MD Primary Care Physician: [place], Betty G, MD Admit Date: 03/03/2019  Reason for Consultation/Follow-up: Establishing goals of care, Terminal Care and Withdrawal of life-sustaining treatment  Subjective: I met today with patient's daughter [name] [name]), 2 sisters, and brother in law in conference room.   Discussed her clinical course as well as plan for compassionate extubation.  Family expressed understanding her condition and being at peace with decision for compassionate extubation.  Discussed with Dr. Ander [name] and bedside RN.  With noted distress on breathing trial, will plan to rotate to morphine for extubation.  Length of Stay: 5  Current Medications: Scheduled Meds:  . sodium chloride flush  10-40 mL Intracatheter Q12H    Continuous Infusions: . sodium chloride Stopped (03/10/19 1940)  . morphine      PRN Meds: acetaminophen **OR** acetaminophen, diphenhydrAMINE, glycopyrrolate **OR** glycopyrrolate **OR** glycopyrrolate, LORazepam, morphine, ondansetron (ZOFRAN) IV, polyvinyl alcohol, sodium chloride flush  Physical Exam         Chronically ill appearing Intubated Minimally responsive (occasional opening eyes but does not follow commands) Decreased air movement Some edema  Vital Signs: BP (!) 136/49   Pulse 82   Temp 97.8 F (36.6 C) (Axillary)   Resp (!) 23   Ht '5\' 5"'  (1.651 m)   Wt 48.3 kg   SpO2 97%   BMI 17.72 kg/m  SpO2: SpO2: 97 % O2 Device: O2 Device: Ventilator O2 Flow Rate: O2 Flow Rate (L/min): 3 L/min  Intake/output summary:   Intake/Output Summary (Last 24 hours) at March 17, 2019 1141 Last  data filed at 2019/03/17 0800 Gross per 24 hour  Intake 2951.57 ml  Output 400 ml  Net 2551.57 ml   LBM: Last BM Date: 02/14/2019 Baseline Weight: Weight: 55 kg Most recent weight: Weight: 48.3 kg       Palliative Assessment/Data:    Flowsheet Rows     Most Recent Value  Intake Tab  Referral Department  Hospitalist  Unit at Time of Referral  ICU  Palliative Care Primary Diagnosis  Sepsis/Infectious Disease  Date Notified  03/07/19  Palliative Care Type  New Palliative care  Reason for referral  Clarify Goals of Care  Date of Admission  02/07/2019  Date first seen by Palliative Care  03/10/19  # of days Palliative referral response time  3 Day(s)  # of days IP prior to Palliative referral  1  Clinical Assessment  Palliative Performance Scale Score  10%  Psychosocial & Spiritual Assessment  Palliative Care Outcomes  Patient/Family meeting held?  Yes  Who was at the meeting?  Daughter  Palliative Care Outcomes  Changed CPR status      Patient Active Problem List   Diagnosis Date Noted  . Pressure injury of skin 03/08/2019  . UTI (urinary tract infection) due to urinary indwelling Foley catheter ([place]) 03/04/2019  . Multiple pathological fractures 02/24/2019  . Ambulatory dysfunction 02/13/2019  . CAD (coronary artery disease) 01/02/2019  . Pressure sore on sacrum 12/26/2018  . Bilateral hydronephrosis 12/26/2018  . Recurrent UTI 12/26/2018  . Ureteral stricture, right 12/26/2018  . Intractable low back pain 12/25/2018  . Chronic prescription opiate use 10/20/2018  . Chronic diarrhea 07/17/2018  . Primary osteoarthritis of right knee 03/01/2018  . Closed fracture of lateral portion of right tibial plateau 03/01/2018  . Pressure ulcer of heel 01/25/2017  . UTI (urinary tract infection) 01/25/2017  . AKI (acute kidney injury) ([place]) 01/24/2017  . Fall 03/24/2016  . Atrial flutter ([place]) 01/26/2016  . Acute kidney injury superimposed on chronic kidney disease ([place])  01/26/2016  . PAD (peripheral artery disease) ([place]) 01/26/2016  . Acute on chronic combined systolic and diastolic CHF  94/80/1655  . Melena   . Acute blood loss anemia   . GI bleed 10/18/2015  . Preop cardiovascular exam 09/04/2015  . Choledocholithiasis   . Pressure ulcer 08/29/2015  . Protein-calorie malnutrition, severe 08/29/2015  . Lung nodule   . Sepsis ([place]) 10/17/2014  . Acute pyelonephritis 10/16/2014  . Type II or unspecified type diabetes mellitus with renal manifestations, uncontrolled(250.42) 01/10/2013  . Carotid artery disease ([place]) 01/03/2013  . DOE (dyspnea on exertion) 11/20/2012  . Claudication in peripheral vascular disease ([place]) 11/20/2012  . Bradycardia 11/20/2012  . Chronic kidney disease, stage III (moderate) ([place]) 11/08/2012  . Hyperlipidemia associated with type 2 diabetes mellitus ([place]) 10/01/2012  . Hypothyroidism 10/01/2012  . Chronic combined systolic and diastolic congestive heart failure ([place]) 06/08/2012  . Abdominal pain 03/09/2012  . Altered mental status 03/09/2012  . Type 2 diabetes mellitus with hypoglycemia, with long-term current use of insulin ([place]) 03/09/2012  . Generalized osteoarthritis of multiple sites 03/09/2012  . HTN (hypertension) 03/09/2012  . Acute encephalopathy 03/09/2012  . Normocytic anemia 03/09/2012  . Elevated LFTs 03/09/2012  . AAA (abdominal aortic aneurysm) ([place]) 03/09/2012  . Lower urinary tract infectious disease 03/08/2012  . Hx of CABG '97 03/08/2012    Palliative Care Assessment & Plan   Patient Profile: [name] is a 79 year old female with past medical history of A. 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  Plan was for nephrostomy tube as well as suprapubic catheter, however, she acutely declined with PEA arrest and is status post ACLS with CPR and respiratory failure resulting in ventilator dependent respiratory failure.  Palliative consulted for goals of care.  Recommendations/Plan:  Family present today and plan for one way extubation.    Discussed  with bedside staff and Dr. Ander [name]  Orders placed per per CCM withdrawal order set. Plan for transition to morphine from fentanyl as she was noted to have agitation this AM with breathing trial.  Goals of Care and Additional Recommendations:  Limitations on Scope of Treatment: Full Comfort Care  Code Status:    Code Status Orders  (From admission, onward)         Start     Ordered   03/28/19 1139  DNR (Do not attempt resuscitation)  Continuous    Question Answer Comment  In the event of cardiac or respiratory ARREST Do not call a "code blue"   In the event of cardiac or respiratory ARREST Do not perform Intubation, CPR, defibrillation or ACLS   In the event of cardiac or respiratory ARREST Use medication by any route, position, wound care, and other measures to relive pain and suffering. May use oxygen, suction and manual treatment of airway obstruction as needed for comfort.      03/28/2019 1139        Code Status History    Date Active Date Inactive Code Status Order ID Comments User Context   03/10/2019 1228 Mar 28, 2019 1139 DNR [id number]  Micheline Rough, MD Inpatient   03/10/2019 1227 03/10/2019 1228 DNR [id number]  Micheline Rough, MD Inpatient   03/10/2019 1227 03/10/2019 1227 DNR [id number]  Micheline Rough, MD Inpatient   02/21/2019 2313 03/10/2019 1226 Full Code [id number]  Shela Leff, MD ED   01/25/2017 0150 01/27/2017 1437 Full Code [id number]  Roney Jaffe, MD Inpatient   03/24/2016 0014 03/25/2016 1853 Full Code [id number]  Norval Morton, MD ED   01/23/2016 1636 01/26/2016 2009 Full Code  [id number]  Mendel Corning, MD Inpatient   10/18/2015 1643 10/23/2015 1610 Full Code [id number]  Willia Craze, NP ED   08/29/2015 0356 09/08/2015 2152 Full Code [id number]  Jani Gravel, MD ED   10/16/2014 2126 10/18/2014 1721 Full Code [id number]  Reubin Milan, MD Inpatient   06/07/2012 2224 06/12/2012 1937 Full Code [id number]  Merton Border, MD Inpatient   05/07/2012 1641 05/08/2012 1536 Full Code [id number]  Thurnell Lose, MD Inpatient   Advance Care Planning Activity       Prognosis:   Hours - Days  Discharge Planning:  Anticipated Hospital Death  Care plan was discussed with family, including daughter, June Leap you for allowing the Palliative Medicine Team to assist in the care of this patient.   Time In: 1100 Time Out: 1150 Total Time 50 Prolonged Time Billed No      Greater than 50%  of this time was spent counseling and coordinating care related to the above assessment and plan.  Micheline Rough, MD  Please contact Palliative Medicine Team phone at [phone] for questions and concerns.

## 2019-04-09 NOTE — Progress Notes (Signed)
Per RN request ~ placed PT on SBT (5/5 CPAP PS) which resulted in increased WOB and some agitation. Increased PS to 10cm which helped settle PT some but did not appear comfortable. RT and RN decided to place PT back on full support via ventilator.

## 2019-04-09 NOTE — Progress Notes (Signed)
Morphine drip dc'd at time of death.  80cc wasted in Stericycle witness Leonie Man RN

## 2019-04-09 NOTE — Progress Notes (Signed)
Palliative care progress note  Present at bedside for one way extubation.  Following extubation, some increased work of breathing noted that improved following additional bolus of morphine.  Work of breathing improved followed by progressive periods of apnea.  Patient without breaths and noted to be asystole on monitor.  No response to noxious stimuli.  No palpable central or peripheral pulses. No auscultatable heart or breath sounds for > 1 minute.  Confirmed by Leonie Man, RN.  Time of death: 71  Daughter visited at bedside following her passing.  All questions answered to best of my ability.  Micheline Rough, MD [facility] Palliative Medicine Team [phone]  NO CHARGE NOTE

## 2019-04-09 NOTE — Progress Notes (Signed)
NAME:  [name] [name], MRN:  [id number], DOB:  09-27-1940, LOS: 42 ADMISSION DATE:  02/15/2019, CONSULTATION DATE:  03/09/2019 REFERRING MD:  Dr Horris Latino, CHIEF COMPLAINT:  Post cardiac arrest   Brief History   Patient with multiple comorbidities, advanced chronic diseases Being treated for UTI Suffered a cardiorespiratory arrest 03/09/2019 Code lasted about 15 minutes of PEA, received 1 round of epinephrine  History of atrial fibrillation, AAA, congestive heart failure, stage III chronic kidney disease, coronary artery disease, CABG, GERD, history of GI bleed, hypertension, hypothyroidism History of retroperitoneal fibrosis, history of bilateral ureteral strictures Left kidney is nonfunctioning, right kidney has had multiple stents-multiple UTIs Recent concern was for 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 A-fib ([place])   . AAA (abdominal aortic aneurysm) ([place])   . Bilateral hydronephrosis   . Carotid artery stenosis    carotid doppler 06/2012 - Right CCA/Bulb/ICA with chronic occlusion; L vertebral artery with abnormal blood flow; L Bulb/Prox ICA  s/p endarterectomy with mild fibrous plaque, 50% diameter reduction  . CHF (congestive heart failure) ([place])   . Choledocholithiasis 2017  . CKD (chronic kidney disease), stage III   . Coronary artery disease due to lipid rich plaque cardiologist-  dr berry   s/p CABG x6 1997-- cath 12-09-2009 occluded vein to obtuse marginal branch and ramus branch with patent vien to PDA and patent LIMA to LAD, ef 40%-- Myoview 11-24-2011, nonischemic  . Diverticulosis   . Dyspnea on exertion   . GERD (gastroesophageal reflux disease)   . GI bleed   . History of sepsis    10-18-2014 w/ acute pyelonephritis  . Hyperlipidemia   . Hypertension   . Hypothyroidism   . Iron deficiency anemia   . Ischemic cardiomyopathy    03-25-2010-- per lasts echo EF  50-55%  . Ischemic cardiomyopathy   .  PAD (peripheral artery disease) ([place])    09/2010 LEAs - R ABI of 0.45, occluded fem-pop bypass graft, L ABI of 0.59 with occluded SFA; severe arterial insuff  . PONV (postoperative nausea and vomiting)   . PVD (peripheral vascular disease) with claudication ([place])    last duplex 07-04-2015 -- Right CCA and ICA chronic occlusion, 96-78% LICA, Patent vertebral arteries w/ antegrade flow, bilateral normal subclavian arteries   . Retroperitoneal fibrosis   . Tubular adenoma of colon   . Type 2 diabetes mellitus ([place])    monitored by dr Dwyane Dee   [facility] Events   Cardiorespiratory arrest 03/09/2019  Consults:  Urology  Procedures:  Endotracheal intubation 03/09/2019 Central line placement 03/09/2019  Significant Diagnostic Tests:  CT abdomen IMPRESSION: 1. No acute intrathoracic pathology. 2. 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 Follow-up in 1 year recommended. 3. Coronary vascular calcification and postsurgical changes of CABG. 4. Partially visualized chronic appearing left hydronephrosis. 5. Small hiatal hernia. 6. Aortic Atherosclerosis (ICD10-I70.0).  Micro Data:  02/23/2019-blood culture-Staphylococcus lugdunensis 03/09/2019-repeat blood culture pending 03/09/2019-urine culture showing Klebsiella  Antimicrobials:  Cefepime 12/29 Ceftriaxone 12/29-1/1 Metronidazole 12/29 Vancomycin 12/29,1/2>> Ciprofloxacin 03/10/2019>>   Interim history/subjective:  No overnight events Sedated On pressors-unable to wean off  Objective   Blood pressure (!) 136/49, pulse 82, temperature 97.8 F (36.6 C), temperature source Axillary, resp. rate (!) 23, height 5\' 5"  (1.651 m), weight 48.3 kg, SpO2 100 %. CVP:  [7 mmHg-15 mmHg] 8 mmHg  Vent Mode: PRVC FiO2 (%):  [  40 %-50 %] 40 % Set Rate:  [21 bmp] 21 bmp Vt Set:  [460 mL] 460 mL PEEP:  [5 cmH20] 5 cmH20 Plateau Pressure:  [16 cmH20-19 cmH20] 19 cmH20    Intake/Output Summary (Last 24 hours) at Apr 06, 2019 1026 Last data filed at 2019/04/06 0800 Gross per 24 hour  Intake 2951.57 ml  Output 400 ml  Net 2551.57 ml   Filed Weights   03/09/19 1240 03/10/19 0450 April 06, 2019 0319  Weight: 48.3 kg 48.3 kg 48.3 kg    Examination: General: Chronically ill-appearing, muscle wasting HENT: Dry oral mucosa Lungs: decreased air movement bilaterally Cardiovascular: S1-S2 appreciated Abdomen: Soft, bowel sounds appreciated Extremities: No clubbing, no edema Neuro: Sedated GU: Fair output  Resolved Hospital Problem list     Assessment & Plan:  Status post cardiorespiratory arrest -15 minutes of PEA -Possible aspiration -Ventilator support, hemodynamic support  Sepsis -On ciprofloxacin -Blood cultures negative, urine showing Klebsiella sensitive to Cipro -Trend CBC -Discontinue vancomycin  Bilateral hydronephrosis -Nonfunctional left kidney -Nephrostomy tube placement, suprapubic catheter placement-on hold as patient is very unstable, not likely to help in the current situation -Appreciate urology involvement  Pathological fracture/concern for metastatic disease -Further work-up deferred at present  Chronic kidney disease stage III with acute kidney injury -Trend electrolytes -Avoid nephrotoxic's  Anemia -Transfuse per protocol  Chronic systolic and diastolic heart failure -Cautious hydration  Hypothyroidism -On Synthroid  Severe malnutrition -Continue enteric support  Diabetes -SSI  Multiple pressure ulcers -Wound following patient  Prognosis is poor Family coming in today  Best practice:  Diet: Feeding per protocol Pain/Anxiety/Delirium protocol (if indicated): Precedex and fentanyl VAP protocol (if indicated): In place DVT prophylaxis: SCD GI prophylaxis: Protonix Glucose control: SSI Mobility: Bedrest Code Status: Full code Family Communication: Will update daughter Disposition: icu  Labs    CBC: Recent Labs  Lab 03/08/19 0634 03/09/19 0520 03/09/19 1220 03/10/19 0500 2019-04-06 0500  WBC 17.4* 18.4* 26.8* 28.3* 29.7*  NEUTROABS 15.3* 16.3* 20.0* 25.4* 26.5*  HGB 7.4* 7.2* 8.1* 7.6* 7.1*  HCT 25.5* 25.5* 29.1* 27.2* 24.9*  MCV 103.7* 106.7* 110.6* 107.9* 108.7*  PLT 407* 412* 504* 529* 442*    Basic Metabolic Panel: Recent Labs  Lab 03/08/19 0634 03/09/19 0520 03/09/19 1220 03/09/19 1448 03/10/19 0500 03/10/19 1733 Apr 06, 2019 0500  NA 141 142 142  --  140  --  140  K 4.0 3.7 3.8  --  3.4*  --  3.2*  CL 107 113* 111  --  113*  --  114*  CO2 23 22 20*  --  17*  --  17*  GLUCOSE 81 101* 132*  --  184*  --  206*  BUN 60* 50* 47*  --  54*  --  67*  CREATININE 1.29* 1.29* 1.35*  --  1.42*  --  1.54*  CALCIUM 7.9* 7.9* 8.0*  --  7.2*  --  7.0*  MG  --   --  2.2 1.8 2.5* 2.3  --   PHOS  --   --  3.8 3.5 2.9 2.0*  --    GFR: Estimated Creatinine Clearance: 23.3 mL/min (A) (by C-G formula based on SCr of 1.54 mg/dL (H)). Recent Labs  Lab 03/01/2019 1745 03/03/2019 1957 02/24/2019 2326 03/09/19 0520 03/09/19 1220 03/10/19 0500 04/06/2019 0500  WBC  --   --   --  18.4* 26.8* 28.3* 29.7*  LATICACIDVEN 2.2* 2.0* 1.4  --   --   --   --     Liver Function Tests:  Recent Labs  Lab 03/09/19 1220  AST 104*  ALT 37  ALKPHOS 153*  BILITOT 0.3  PROT 5.0*  ALBUMIN 1.4*   No results for input(s): LIPASE, AMYLASE in the last 168 hours. No results for input(s): AMMONIA in the last 168 hours.  ABG    Component Value Date/Time   PHART 7.303 (L) 03/10/2019 0425   PCO2ART 33.9 03/10/2019 0425   PO2ART 153 (H) 03/10/2019 0425   HCO3 16.4 (L) 03/10/2019 0425   TCO2 17 08/28/2015 1834   ACIDBASEDEF 8.8 (H) 03/10/2019 0425   O2SAT 98.8 03/10/2019 0425     Coagulation Profile: Recent Labs  Lab 03/02/2019 1718 03/09/19 1220  INR 1.1 1.4*    Cardiac Enzymes: No results for input(s): CKTOTAL, CKMB, CKMBINDEX, TROPONINI in the last 168 hours.  HbA1C: Hgb A1c MFr Bld   Date/Time Value Ref Range Status  02/21/2019 11:26 PM 4.9 4.8 - 5.6 % Final    Comment:    (NOTE) Pre diabetes:          5.7%-6.4% Diabetes:              >6.4% Glycemic control for   <7.0% adults with diabetes   11/22/2018 09:41 AM 5.6 4.8 - 5.6 % Final    Comment:    (NOTE)         Prediabetes: 5.7 - 6.4         Diabetes: >6.4         Glycemic control for adults with diabetes: <7.0     CBG: Recent Labs  Lab 03/10/19 1554 03/10/19 1928 03/10/19 2338 Mar 18, 2019 0323 03/18/19 0735  GLUCAP 175* 169* 191* 220* 194*    Review of Systems:   Unable to review- sedated on vent  Past Medical History  She,  has a past medical history of A-fib ([place]), AAA (abdominal aortic aneurysm) ([place]), Bilateral hydronephrosis, Carotid artery stenosis, CHF (congestive heart failure) ([place]), Choledocholithiasis (2017), CKD (chronic kidney disease), stage III, Coronary artery disease due to lipid rich plaque (cardiologist-  dr berry), Diverticulosis, Dyspnea on exertion, GERD (gastroesophageal reflux disease), GI bleed, History of sepsis, Hyperlipidemia, Hypertension, Hypothyroidism, Iron deficiency anemia, Ischemic cardiomyopathy, Ischemic cardiomyopathy, PAD (peripheral artery disease) ([place]), PONV (postoperative nausea and vomiting), PVD (peripheral vascular disease) with claudication ([place]), Retroperitoneal fibrosis, Tubular adenoma of colon, and Type 2 diabetes mellitus ([place]).   Surgical History    Past Surgical History:  Procedure Laterality Date  . AORTA - BILATERAL FEMORAL ARTERY BYPASS GRAFT  1997   and RIGHT FEM-POP   . CARDIAC CATHETERIZATION  12-09-2009  dr al little   EF >40%-- occluded vein to OM & ramus branches, patent vein to PDA, patent LIMA to LAD (Dr. [name], [facility]) - later had thrombectomy of R fem-pop bypass ad R common femoral & profunda femoris artery (Dr. Oneida Alar)  . CARDIOVASCULAR STRESS TEST  11-24-2011   dr berry   Low Risk study: fixed basal to mid inferior attenuation  artifact, no reversible ischemia,  normal LV function and wall motion , ef 67%  . CAROTID ENDARTERECTOMY Bilateral right 1994//  left ?  Marland Kitchen CATARACT EXTRACTION W/ INTRAOCULAR LENS  IMPLANT, BILATERAL  2006  . CHOLECYSTECTOMY N/A 09/05/2015   Procedure: ATTEMPTED LAPAROSCOPIC CHOLECYSTECTOMY, EXPLORATORY LAPAROTOMY WITH CHOLECYSTECTOMY;  Surgeon: Autumn Messing III, MD;  Location: [place];  Service: General;  Laterality: N/A;  . COLONOSCOPY    . COLONOSCOPY  10/21/2015  . COLONOSCOPY WITH PROPOFOL N/A 10/21/2015   Procedure: COLONOSCOPY WITH PROPOFOL;  Surgeon: Melene Plan  Ardis Hughs, MD;  Location: [place];  Service: Endoscopy;  Laterality: N/A;  . CORONARY ARTERY BYPASS GRAFT  1997   x6; internal mammary to LAD, SVG to ramus #1 & #2, SVG to OM, SVG to PDA,   . CYSTOSCOPY W/ RETROGRADES Right 08/01/2015   Procedure: CYSTOSCOPY WITH RETROGRADE PYELOGRAM;  Surgeon: Ardis Hughs, MD;  Location[facility];  Service: Urology;  Laterality: Right;  . CYSTOSCOPY W/ URETERAL STENT PLACEMENT  03/10/2012   Procedure: CYSTOSCOPY WITH RETROGRADE PYELOGRAM/URETERAL STENT PLACEMENT;  Surgeon: Hanley Ben, MD;  Location: [facility];  Service: Urology;  Laterality: Left;  . CYSTOSCOPY W/ URETERAL STENT PLACEMENT Bilateral 03/07/2015   Procedure: BILATERAL RETROGRADE PYELOGRAM AND RIGHT URETERAL STENT PLACEMENT;  Surgeon: Ardis Hughs, MD;  Location: [facility];  Service: Urology;  Laterality: Bilateral;  . CYSTOSCOPY W/ URETERAL STENT PLACEMENT Right 08/01/2015   Procedure: CYSTOSCOPY WITH STENT REPLACEMENT;  Surgeon: Ardis Hughs, MD;  Location: [facility];  Service: Urology;  Laterality: Right;  . CYSTOSCOPY W/ URETERAL STENT PLACEMENT Right 02/13/2016   Procedure: RIGHT URETERAL STENT EXCHANGE;  Surgeon: Ardis Hughs, MD;  Location: [facility];  Service: Urology;  Laterality: Right;  . CYSTOSCOPY W/ URETERAL STENT PLACEMENT Right 09/17/2016   Procedure:  CYSTOSCOPY WITH RIGHT  RETROGRADE PYELOGRAM RIGHT URETERAL STENT EXCHANGE;  Surgeon: Ardis Hughs, MD;  Location: [facility];  Service: Urology;  Laterality: Right;  . CYSTOSCOPY W/ URETERAL STENT PLACEMENT Bilateral 03/18/2017   Procedure: CYSTOSCOPY WITH BILATERAL  RETROGRADE PYELOGRAM RIGHT URETERAL STENT [place];  Surgeon: Ardis Hughs, MD;  Location: [facility];  Service: Urology;  Laterality: Bilateral;  . CYSTOSCOPY W/ URETERAL STENT PLACEMENT Right 05/26/2018   Procedure: CYSTOSCOPY WITH RIGHT RETROGRADE PYELOGRAM RIGHT URETERAL STENT EXCHANGE;  Surgeon: Ardis Hughs, MD;  Location: [facility];  Service: Urology;  Laterality: Right;  . CYSTOSCOPY WITH STENT PLACEMENT Right 10/06/2017   Procedure: CYSTOSCOPY, RETROGRADE  WITH RIGHT STENT EXCHANGE;  Surgeon: Ardis Hughs, MD;  Location: [facility];  Service: Urology;  Laterality: Right;  . CYSTOSCOPY WITH STENT PLACEMENT Right 12/01/2018   Procedure: CYSTOSCOPY WITH STENT PLACEMENT;  Surgeon: Ardis Hughs, MD;  Location[facility];  Service: Urology;  Laterality: Right;  . ERCP N/A 09/03/2015   Procedure: ENDOSCOPIC RETROGRADE CHOLANGIOPANCREATOGRAPHY (ERCP);  Surgeon: Irene Shipper, MD;  Location[facility];  Service: Endoscopy;  Laterality: N/A;  . ESOPHAGOGASTRODUODENOSCOPY    . ESOPHAGOGASTRODUODENOSCOPY N/A 10/19/2015   Procedure: ESOPHAGOGASTRODUODENOSCOPY (EGD);  Surgeon: Gatha Mayer, MD;  [facility];  Service: Endoscopy;  Laterality: N/A;  . GIVENS CAPSULE STUDY  10/21/2015  . GIVENS CAPSULE STUDY N/A 10/21/2015   Procedure: GIVENS CAPSULE STUDY;  Surgeon: Milus Banister, MD;  Location: [place];  Service: Endoscopy;  Laterality: N/A;  . KNEE SURGERY Right 03-01-2018-03-04-2018   PLATEAU FRACTURE HOSPITLIZATION   . REPAIR RIGHT FEMORAL PSEUDOANEUYSM/  RIGHT FEM-POP BYPASS GRAFT/  DEBRIDEMENT RIGHT LOWER EXTREMITIY VENOUS STATUS ULCERS X2  01-12-2005  . TOTAL ABDOMINAL HYSTERECTOMY W/ BILATERAL  SALPINGOOPHORECTOMY  1986  . TRANSTHORACIC ECHOCARDIOGRAM  03/25/2010   EF 19-14%, LV systolic function low normal with mild inferoseptal hypocontractility; LA mildly dilated; mod MR; mild TR, RV systolic pressure elevated, mild pulm HTN; AV mildly sclerotic; mild pulm valve regurg; aortic root sclerosis/calcif      Social History   reports that she quit smoking about 9 years ago. She has a 60.00 pack-year smoking history. She has never used  smokeless tobacco. She reports current alcohol use of about 3.0 standard drinks of alcohol per week. She reports that she does not use drugs.   Family History   Her family history includes Cancer in her maternal aunt; Congestive Heart Failure in her mother; Diabetes in her mother; Stroke in her father.   Allergies No Known Allergies    The patient is critically ill with multiple organ systems failure and requires high complexity decision making for assessment and support, frequent evaluation and titration of therapies, application of advanced monitoring technologies and extensive interpretation of multiple databases. Critical Care Time devoted to patient care services described in this note independent of APP/resident time (if applicable)  is 35 minutes.   Sherrilyn Rist MD [facility] Personal pager: [phone] If unanswered, please page CCM On-call: [phone]

## 2019-04-09 NOTE — Progress Notes (Signed)
Fentanyl Drip dc'd.  Wasted 120  Cc of Fentanyl 2500/250 ml. Witness by Leonie Man RN

## 2019-04-09 DEATH — deceased
